# Patient Record
Sex: Male | Born: 1955
Health system: Southern US, Community
[De-identification: ages and names within clinical notes are randomized; demographics above are authoritative.]

## PROBLEM LIST (undated history)

## (undated) DIAGNOSIS — C61 Malignant neoplasm of prostate: Secondary | ICD-10-CM

## (undated) DIAGNOSIS — E7800 Pure hypercholesterolemia, unspecified: Secondary | ICD-10-CM

## (undated) DIAGNOSIS — I82409 Acute embolism and thrombosis of unspecified deep veins of unspecified lower extremity: Secondary | ICD-10-CM

## (undated) DIAGNOSIS — B1920 Unspecified viral hepatitis C without hepatic coma: Secondary | ICD-10-CM

## (undated) DIAGNOSIS — G4730 Sleep apnea, unspecified: Secondary | ICD-10-CM

## (undated) DIAGNOSIS — M1990 Unspecified osteoarthritis, unspecified site: Secondary | ICD-10-CM

## (undated) DIAGNOSIS — F329 Major depressive disorder, single episode, unspecified: Secondary | ICD-10-CM

## (undated) DIAGNOSIS — E119 Type 2 diabetes mellitus without complications: Secondary | ICD-10-CM

## (undated) DIAGNOSIS — I10 Essential (primary) hypertension: Secondary | ICD-10-CM

## (undated) DIAGNOSIS — I2699 Other pulmonary embolism without acute cor pulmonale: Secondary | ICD-10-CM

## (undated) DIAGNOSIS — F32A Depression, unspecified: Secondary | ICD-10-CM

## (undated) HISTORY — DX: Unspecified osteoarthritis, unspecified site: M19.90

## (undated) HISTORY — DX: Acute embolism and thrombosis of unspecified deep veins of unspecified lower extremity: I82.409

## (undated) HISTORY — DX: Pure hypercholesterolemia, unspecified: E78.00

## (undated) HISTORY — PX: REPAIR QUADRICEPS / HAMSTRING MUSCLE: SUR1204

---

## 1999-04-28 ENCOUNTER — Emergency Department (HOSPITAL_COMMUNITY): Admission: EM | Admit: 1999-04-28 | Discharge: 1999-04-28 | Payer: Self-pay

## 1999-06-10 ENCOUNTER — Encounter: Admission: RE | Admit: 1999-06-10 | Discharge: 1999-09-08 | Payer: Self-pay | Admitting: Occupational Medicine

## 1999-06-12 ENCOUNTER — Encounter: Payer: Self-pay | Admitting: Occupational Medicine

## 1999-06-12 ENCOUNTER — Ambulatory Visit (HOSPITAL_COMMUNITY): Admission: RE | Admit: 1999-06-12 | Discharge: 1999-06-12 | Payer: Self-pay | Admitting: Occupational Medicine

## 1999-07-19 ENCOUNTER — Emergency Department (HOSPITAL_COMMUNITY): Admission: EM | Admit: 1999-07-19 | Discharge: 1999-07-19 | Payer: Self-pay | Admitting: Emergency Medicine

## 1999-08-04 ENCOUNTER — Encounter: Admission: RE | Admit: 1999-08-04 | Discharge: 1999-11-02 | Payer: Self-pay | Admitting: Orthopedic Surgery

## 2001-02-28 ENCOUNTER — Emergency Department (HOSPITAL_COMMUNITY): Admission: EM | Admit: 2001-02-28 | Discharge: 2001-02-28 | Payer: Self-pay | Admitting: Emergency Medicine

## 2001-02-28 ENCOUNTER — Encounter: Payer: Self-pay | Admitting: Emergency Medicine

## 2001-04-17 ENCOUNTER — Encounter: Payer: Self-pay | Admitting: *Deleted

## 2001-04-17 ENCOUNTER — Emergency Department (HOSPITAL_COMMUNITY): Admission: EM | Admit: 2001-04-17 | Discharge: 2001-04-17 | Payer: Self-pay

## 2001-05-03 ENCOUNTER — Emergency Department (HOSPITAL_COMMUNITY): Admission: EM | Admit: 2001-05-03 | Discharge: 2001-05-03 | Payer: Self-pay | Admitting: Emergency Medicine

## 2001-05-18 ENCOUNTER — Encounter: Payer: Self-pay | Admitting: Emergency Medicine

## 2001-05-18 ENCOUNTER — Emergency Department (HOSPITAL_COMMUNITY): Admission: EM | Admit: 2001-05-18 | Discharge: 2001-05-18 | Payer: Self-pay | Admitting: Emergency Medicine

## 2001-06-12 ENCOUNTER — Emergency Department (HOSPITAL_COMMUNITY): Admission: EM | Admit: 2001-06-12 | Discharge: 2001-06-12 | Payer: Self-pay | Admitting: Emergency Medicine

## 2001-06-12 ENCOUNTER — Encounter: Payer: Self-pay | Admitting: Emergency Medicine

## 2001-07-03 ENCOUNTER — Inpatient Hospital Stay (HOSPITAL_COMMUNITY): Admission: EM | Admit: 2001-07-03 | Discharge: 2001-07-07 | Payer: Self-pay | Admitting: Psychiatry

## 2001-08-22 ENCOUNTER — Inpatient Hospital Stay (HOSPITAL_COMMUNITY): Admission: RE | Admit: 2001-08-22 | Discharge: 2001-08-25 | Payer: Self-pay | Admitting: Orthopedic Surgery

## 2001-11-10 ENCOUNTER — Encounter: Admission: RE | Admit: 2001-11-10 | Discharge: 2001-11-10 | Payer: Self-pay | Admitting: *Deleted

## 2001-12-11 ENCOUNTER — Encounter: Admission: RE | Admit: 2001-12-11 | Discharge: 2001-12-11 | Payer: Self-pay | Admitting: *Deleted

## 2001-12-12 ENCOUNTER — Encounter: Admission: RE | Admit: 2001-12-12 | Discharge: 2002-03-12 | Payer: Self-pay | Admitting: Orthopedic Surgery

## 2002-01-10 ENCOUNTER — Encounter: Admission: RE | Admit: 2002-01-10 | Discharge: 2002-01-10 | Payer: Self-pay | Admitting: *Deleted

## 2002-03-13 ENCOUNTER — Encounter: Admission: RE | Admit: 2002-03-13 | Discharge: 2002-04-03 | Payer: Self-pay | Admitting: Orthopedic Surgery

## 2002-04-10 ENCOUNTER — Encounter: Admission: RE | Admit: 2002-04-10 | Discharge: 2002-04-10 | Payer: Self-pay | Admitting: *Deleted

## 2002-05-08 ENCOUNTER — Encounter: Admission: RE | Admit: 2002-05-08 | Discharge: 2002-05-08 | Payer: Self-pay | Admitting: *Deleted

## 2002-09-19 ENCOUNTER — Emergency Department (HOSPITAL_COMMUNITY): Admission: EM | Admit: 2002-09-19 | Discharge: 2002-09-19 | Payer: Self-pay | Admitting: Emergency Medicine

## 2002-10-03 ENCOUNTER — Inpatient Hospital Stay (HOSPITAL_COMMUNITY): Admission: EM | Admit: 2002-10-03 | Discharge: 2002-10-12 | Payer: Self-pay | Admitting: *Deleted

## 2002-11-05 ENCOUNTER — Encounter: Admission: RE | Admit: 2002-11-05 | Discharge: 2002-11-05 | Payer: Self-pay | Admitting: *Deleted

## 2002-12-31 ENCOUNTER — Encounter: Admission: RE | Admit: 2002-12-31 | Discharge: 2002-12-31 | Payer: Self-pay | Admitting: *Deleted

## 2003-07-21 ENCOUNTER — Emergency Department (HOSPITAL_COMMUNITY): Admission: AD | Admit: 2003-07-21 | Discharge: 2003-07-21 | Payer: Self-pay | Admitting: Family Medicine

## 2003-08-20 ENCOUNTER — Inpatient Hospital Stay (HOSPITAL_COMMUNITY): Admission: EM | Admit: 2003-08-20 | Discharge: 2003-08-30 | Payer: Self-pay | Admitting: Psychiatry

## 2003-09-02 ENCOUNTER — Other Ambulatory Visit (HOSPITAL_COMMUNITY): Admission: RE | Admit: 2003-09-02 | Discharge: 2003-09-12 | Payer: Self-pay | Admitting: Psychiatry

## 2004-06-05 ENCOUNTER — Ambulatory Visit (HOSPITAL_COMMUNITY): Payer: Self-pay | Admitting: Psychiatry

## 2004-07-08 ENCOUNTER — Ambulatory Visit (HOSPITAL_COMMUNITY): Payer: Self-pay | Admitting: Psychiatry

## 2005-01-19 ENCOUNTER — Emergency Department (HOSPITAL_COMMUNITY): Admission: EM | Admit: 2005-01-19 | Discharge: 2005-01-19 | Payer: Self-pay | Admitting: Emergency Medicine

## 2005-02-05 ENCOUNTER — Emergency Department (HOSPITAL_COMMUNITY): Admission: EM | Admit: 2005-02-05 | Discharge: 2005-02-05 | Payer: Self-pay | Admitting: Emergency Medicine

## 2005-04-14 ENCOUNTER — Inpatient Hospital Stay (HOSPITAL_COMMUNITY): Admission: RE | Admit: 2005-04-14 | Discharge: 2005-04-23 | Payer: Self-pay | Admitting: Psychiatry

## 2005-04-14 ENCOUNTER — Ambulatory Visit: Payer: Self-pay | Admitting: Psychiatry

## 2005-04-14 ENCOUNTER — Emergency Department (HOSPITAL_COMMUNITY): Admission: EM | Admit: 2005-04-14 | Discharge: 2005-04-14 | Payer: Self-pay | Admitting: Emergency Medicine

## 2005-04-26 ENCOUNTER — Ambulatory Visit: Payer: Self-pay | Admitting: Psychiatry

## 2005-04-26 ENCOUNTER — Other Ambulatory Visit (HOSPITAL_COMMUNITY): Admission: RE | Admit: 2005-04-26 | Discharge: 2005-07-25 | Payer: Self-pay | Admitting: Psychiatry

## 2005-10-03 ENCOUNTER — Inpatient Hospital Stay (HOSPITAL_COMMUNITY): Admission: RE | Admit: 2005-10-03 | Discharge: 2005-10-13 | Payer: Self-pay | Admitting: Psychiatry

## 2005-10-04 ENCOUNTER — Ambulatory Visit: Payer: Self-pay | Admitting: Psychiatry

## 2006-07-09 ENCOUNTER — Ambulatory Visit: Payer: Self-pay | Admitting: Psychiatry

## 2006-07-09 ENCOUNTER — Inpatient Hospital Stay (HOSPITAL_COMMUNITY): Admission: EM | Admit: 2006-07-09 | Discharge: 2006-07-16 | Payer: Self-pay | Admitting: Psychiatry

## 2006-08-16 HISTORY — PX: PROSTATECTOMY: SHX69

## 2006-12-26 ENCOUNTER — Emergency Department (HOSPITAL_COMMUNITY): Admission: EM | Admit: 2006-12-26 | Discharge: 2006-12-26 | Payer: Self-pay | Admitting: Emergency Medicine

## 2006-12-29 ENCOUNTER — Emergency Department (HOSPITAL_COMMUNITY): Admission: EM | Admit: 2006-12-29 | Discharge: 2006-12-29 | Payer: Self-pay | Admitting: Emergency Medicine

## 2007-12-11 ENCOUNTER — Encounter: Admission: RE | Admit: 2007-12-11 | Discharge: 2007-12-11 | Payer: Self-pay | Admitting: Internal Medicine

## 2009-01-02 ENCOUNTER — Other Ambulatory Visit: Payer: Self-pay | Admitting: Emergency Medicine

## 2009-01-02 ENCOUNTER — Ambulatory Visit: Payer: Self-pay | Admitting: Psychiatry

## 2009-01-03 ENCOUNTER — Inpatient Hospital Stay (HOSPITAL_COMMUNITY): Admission: AD | Admit: 2009-01-03 | Discharge: 2009-01-17 | Payer: Self-pay | Admitting: Psychiatry

## 2009-01-03 ENCOUNTER — Other Ambulatory Visit: Payer: Self-pay | Admitting: Emergency Medicine

## 2009-03-11 ENCOUNTER — Inpatient Hospital Stay (HOSPITAL_COMMUNITY): Admission: EM | Admit: 2009-03-11 | Discharge: 2009-03-13 | Payer: Self-pay | Admitting: Emergency Medicine

## 2009-03-13 ENCOUNTER — Encounter (INDEPENDENT_AMBULATORY_CARE_PROVIDER_SITE_OTHER): Payer: Self-pay | Admitting: Internal Medicine

## 2009-03-22 ENCOUNTER — Observation Stay (HOSPITAL_COMMUNITY): Admission: EM | Admit: 2009-03-22 | Discharge: 2009-03-23 | Payer: Self-pay | Admitting: Emergency Medicine

## 2009-03-22 ENCOUNTER — Encounter (INDEPENDENT_AMBULATORY_CARE_PROVIDER_SITE_OTHER): Payer: Self-pay | Admitting: General Surgery

## 2009-04-11 DIAGNOSIS — F329 Major depressive disorder, single episode, unspecified: Secondary | ICD-10-CM

## 2009-04-11 DIAGNOSIS — E119 Type 2 diabetes mellitus without complications: Secondary | ICD-10-CM | POA: Insufficient documentation

## 2009-04-11 DIAGNOSIS — K5730 Diverticulosis of large intestine without perforation or abscess without bleeding: Secondary | ICD-10-CM | POA: Insufficient documentation

## 2009-04-11 DIAGNOSIS — R109 Unspecified abdominal pain: Secondary | ICD-10-CM | POA: Insufficient documentation

## 2009-04-11 DIAGNOSIS — K819 Cholecystitis, unspecified: Secondary | ICD-10-CM

## 2009-04-11 DIAGNOSIS — I10 Essential (primary) hypertension: Secondary | ICD-10-CM | POA: Insufficient documentation

## 2009-04-11 DIAGNOSIS — F1910 Other psychoactive substance abuse, uncomplicated: Secondary | ICD-10-CM | POA: Insufficient documentation

## 2009-04-11 DIAGNOSIS — R079 Chest pain, unspecified: Secondary | ICD-10-CM | POA: Insufficient documentation

## 2009-04-11 DIAGNOSIS — F319 Bipolar disorder, unspecified: Secondary | ICD-10-CM | POA: Insufficient documentation

## 2009-04-11 DIAGNOSIS — E669 Obesity, unspecified: Secondary | ICD-10-CM

## 2009-06-02 ENCOUNTER — Ambulatory Visit: Payer: Self-pay | Admitting: Cardiovascular Disease

## 2009-06-02 DIAGNOSIS — I4510 Unspecified right bundle-branch block: Secondary | ICD-10-CM

## 2009-06-02 DIAGNOSIS — Q249 Congenital malformation of heart, unspecified: Secondary | ICD-10-CM | POA: Insufficient documentation

## 2009-06-23 ENCOUNTER — Emergency Department (HOSPITAL_COMMUNITY): Admission: EM | Admit: 2009-06-23 | Discharge: 2009-06-23 | Payer: Self-pay | Admitting: Emergency Medicine

## 2009-10-13 ENCOUNTER — Ambulatory Visit (HOSPITAL_COMMUNITY): Admission: RE | Admit: 2009-10-13 | Discharge: 2009-10-13 | Payer: Self-pay | Admitting: Urology

## 2009-11-13 ENCOUNTER — Inpatient Hospital Stay (HOSPITAL_COMMUNITY): Admission: RE | Admit: 2009-11-13 | Discharge: 2009-11-16 | Payer: Self-pay | Admitting: Urology

## 2009-11-13 ENCOUNTER — Encounter (INDEPENDENT_AMBULATORY_CARE_PROVIDER_SITE_OTHER): Payer: Self-pay | Admitting: Urology

## 2009-12-04 ENCOUNTER — Inpatient Hospital Stay (HOSPITAL_COMMUNITY): Admission: EM | Admit: 2009-12-04 | Discharge: 2009-12-19 | Payer: Self-pay | Admitting: Emergency Medicine

## 2009-12-19 ENCOUNTER — Inpatient Hospital Stay (HOSPITAL_COMMUNITY): Admission: AD | Admit: 2009-12-19 | Discharge: 2009-12-24 | Payer: Self-pay | Admitting: Psychiatry

## 2009-12-19 ENCOUNTER — Ambulatory Visit: Payer: Self-pay | Admitting: Psychiatry

## 2009-12-24 ENCOUNTER — Ambulatory Visit (HOSPITAL_COMMUNITY): Admission: RE | Admit: 2009-12-24 | Discharge: 2009-12-24 | Payer: Self-pay | Admitting: Psychiatry

## 2009-12-24 ENCOUNTER — Ambulatory Visit: Payer: Self-pay | Admitting: Vascular Surgery

## 2009-12-24 ENCOUNTER — Encounter (HOSPITAL_COMMUNITY): Payer: Self-pay | Admitting: Psychiatry

## 2009-12-24 ENCOUNTER — Inpatient Hospital Stay (HOSPITAL_COMMUNITY): Admission: EM | Admit: 2009-12-24 | Discharge: 2009-12-29 | Payer: Self-pay | Admitting: Psychiatry

## 2009-12-25 ENCOUNTER — Ambulatory Visit: Payer: Self-pay | Admitting: Psychiatry

## 2009-12-29 ENCOUNTER — Inpatient Hospital Stay (HOSPITAL_COMMUNITY): Admission: EM | Admit: 2009-12-29 | Discharge: 2010-01-03 | Payer: Self-pay | Admitting: Psychiatry

## 2010-07-02 ENCOUNTER — Emergency Department (HOSPITAL_COMMUNITY): Admission: EM | Admit: 2010-07-02 | Discharge: 2010-07-02 | Payer: Self-pay | Admitting: Emergency Medicine

## 2010-11-02 ENCOUNTER — Encounter (HOSPITAL_COMMUNITY): Payer: Medicare Other

## 2010-11-02 ENCOUNTER — Ambulatory Visit (HOSPITAL_COMMUNITY)
Admission: RE | Admit: 2010-11-02 | Discharge: 2010-11-02 | Disposition: A | Discharge status: Home / Self Care | Payer: Medicare Other | Source: Ambulatory Visit | Attending: General Surgery | Admitting: General Surgery

## 2010-11-02 ENCOUNTER — Other Ambulatory Visit (HOSPITAL_COMMUNITY): Payer: Self-pay | Admitting: General Surgery

## 2010-11-02 DIAGNOSIS — K439 Ventral hernia without obstruction or gangrene: Secondary | ICD-10-CM | POA: Insufficient documentation

## 2010-11-02 DIAGNOSIS — Z01818 Encounter for other preprocedural examination: Secondary | ICD-10-CM | POA: Insufficient documentation

## 2010-11-02 LAB — CBC
HCT: 39.4 % (ref 39.0–52.0)
HCT: 39.2 % (ref 39.0–52.0)
Hemoglobin: 13.3 g/dL (ref 13.0–17.0)
Hemoglobin: 13.1 g/dL (ref 13.0–17.0)
Hemoglobin: 13.2 g/dL (ref 13.0–17.0)
MCHC: 33.7 g/dL (ref 30.0–36.0)
MCHC: 33.6 g/dL (ref 30.0–36.0)
MCHC: 33.6 g/dL (ref 30.0–36.0)
MCV: 95.5 fL (ref 78.0–100.0)
RBC: 4.18 MIL/uL — ABNORMAL LOW (ref 4.22–5.81)
RBC: 4.09 MIL/uL — ABNORMAL LOW (ref 4.22–5.81)
RBC: 4.16 MIL/uL — ABNORMAL LOW (ref 4.22–5.81)
RDW: 12.8 % (ref 11.5–15.5)
RDW: 14.1 % (ref 11.5–15.5)
WBC: 4.2 10*3/uL (ref 4.0–10.5)

## 2010-11-02 LAB — CLOSTRIDIUM DIFFICILE EIA

## 2010-11-02 LAB — GLUCOSE, CAPILLARY
Glucose-Capillary: 103 mg/dL — ABNORMAL HIGH (ref 70–99)
Glucose-Capillary: 105 mg/dL — ABNORMAL HIGH (ref 70–99)
Glucose-Capillary: 110 mg/dL — ABNORMAL HIGH (ref 70–99)
Glucose-Capillary: 112 mg/dL — ABNORMAL HIGH (ref 70–99)
Glucose-Capillary: 118 mg/dL — ABNORMAL HIGH (ref 70–99)
Glucose-Capillary: 120 mg/dL — ABNORMAL HIGH (ref 70–99)
Glucose-Capillary: 122 mg/dL — ABNORMAL HIGH (ref 70–99)
Glucose-Capillary: 132 mg/dL — ABNORMAL HIGH (ref 70–99)
Glucose-Capillary: 141 mg/dL — ABNORMAL HIGH (ref 70–99)
Glucose-Capillary: 94 mg/dL (ref 70–99)
Glucose-Capillary: 115 mg/dL — ABNORMAL HIGH (ref 70–99)
Glucose-Capillary: 121 mg/dL — ABNORMAL HIGH (ref 70–99)
Glucose-Capillary: 122 mg/dL — ABNORMAL HIGH (ref 70–99)
Glucose-Capillary: 91 mg/dL (ref 70–99)

## 2010-11-02 LAB — COMPREHENSIVE METABOLIC PANEL
ALT: 90 U/L — ABNORMAL HIGH (ref 0–53)
AST: 43 U/L — ABNORMAL HIGH (ref 0–37)
Albumin: 3.6 g/dL (ref 3.5–5.2)
Alkaline Phosphatase: 60 U/L (ref 39–117)
BUN: 17 mg/dL (ref 6–23)
BUN: 17 mg/dL (ref 6–23)
CO2: 24 mEq/L (ref 19–32)
Calcium: 9.4 mg/dL (ref 8.4–10.5)
Chloride: 107 mEq/L (ref 96–112)
Creatinine, Ser: 0.89 mg/dL (ref 0.4–1.5)
GFR calc Af Amer: >60 mL/min (ref >60)
GFR calc non Af Amer: >60 mL/min (ref >60)
Glucose, Bld: 109 mg/dL — ABNORMAL HIGH (ref 70–99)
Potassium: 4.2 mEq/L (ref 3.5–5.1)
Sodium: 135 mEq/L (ref 135–145)
Total Bilirubin: 0.6 mg/dL (ref 0.3–1.2)
Total Protein: 7.9 g/dL (ref 6.0–8.3)

## 2010-11-02 LAB — HEPATIC FUNCTION PANEL
ALT: 128 U/L — ABNORMAL HIGH (ref 0–53)
AST: 121 U/L — ABNORMAL HIGH (ref 0–37)
Albumin: 3 g/dL — ABNORMAL LOW (ref 3.5–5.2)
Bilirubin, Direct: 0.1 mg/dL (ref 0.0–0.3)
Total Protein: 9.3 g/dL — ABNORMAL HIGH (ref 6.0–8.3)

## 2010-11-02 LAB — PROTIME-INR
INR: 1.76 — ABNORMAL HIGH (ref 0.00–1.49)
INR: 2.29 — ABNORMAL HIGH (ref 0.00–1.49)
INR: 2.35 — ABNORMAL HIGH (ref 0.00–1.49)
INR: 2.52 — ABNORMAL HIGH (ref 0.00–1.49)
INR: 2.23 — ABNORMAL HIGH (ref 0.00–1.49)
INR: 2.28 — ABNORMAL HIGH (ref 0.00–1.49)
Prothrombin Time: 20.7 seconds — ABNORMAL HIGH (ref 11.6–15.2)
Prothrombin Time: 25.5 seconds — ABNORMAL HIGH (ref 11.6–15.2)
Prothrombin Time: 27 seconds — ABNORMAL HIGH (ref 11.6–15.2)
Prothrombin Time: 24.9 seconds — ABNORMAL HIGH (ref 11.6–15.2)

## 2010-11-02 LAB — BASIC METABOLIC PANEL
CO2: 26 mEq/L (ref 19–32)
Calcium: 9.7 mg/dL (ref 8.4–10.5)
Calcium: 9.8 mg/dL (ref 8.4–10.5)
Creatinine, Ser: 0.78 mg/dL (ref 0.4–1.5)
GFR calc Af Amer: >60 mL/min (ref >60)
GFR calc Af Amer: >60 mL/min (ref >60)
GFR calc non Af Amer: >60 mL/min (ref >60)
Potassium: 4.6 mEq/L (ref 3.5–5.1)
Sodium: 134 mEq/L — ABNORMAL LOW (ref 135–145)
Sodium: 136 mEq/L (ref 135–145)

## 2010-11-02 LAB — DIFFERENTIAL
Basophils Relative: 1 % (ref 0–1)
Eosinophils Absolute: 0.1 10*3/uL (ref 0.0–0.7)
Lymphocytes Relative: 61 % — ABNORMAL HIGH (ref 12–46)
Lymphs Abs: 2.9 10*3/uL (ref 0.7–4.0)
Neutro Abs: 1.3 10*3/uL — ABNORMAL LOW (ref 1.7–7.7)
Neutrophils Relative %: 28 % — ABNORMAL LOW (ref 43–77)

## 2010-11-02 LAB — SURGICAL PCR SCREEN: MRSA, PCR: NEGATIVE

## 2010-11-02 LAB — HEPARIN ANTI-XA: Heparin LMW: 0.59 IU/mL

## 2010-11-03 LAB — HEPARIN LEVEL (UNFRACTIONATED): Heparin Unfractionated: 0.51 IU/mL (ref 0.30–0.70)

## 2010-11-03 LAB — GLUCOSE, CAPILLARY
Glucose-Capillary: 101 mg/dL — ABNORMAL HIGH (ref 70–99)
Glucose-Capillary: 113 mg/dL — ABNORMAL HIGH (ref 70–99)
Glucose-Capillary: 114 mg/dL — ABNORMAL HIGH (ref 70–99)
Glucose-Capillary: 114 mg/dL — ABNORMAL HIGH (ref 70–99)
Glucose-Capillary: 118 mg/dL — ABNORMAL HIGH (ref 70–99)
Glucose-Capillary: 120 mg/dL — ABNORMAL HIGH (ref 70–99)
Glucose-Capillary: 131 mg/dL — ABNORMAL HIGH (ref 70–99)
Glucose-Capillary: 86 mg/dL (ref 70–99)
Glucose-Capillary: 103 mg/dL — ABNORMAL HIGH (ref 70–99)
Glucose-Capillary: 111 mg/dL — ABNORMAL HIGH (ref 70–99)
Glucose-Capillary: 111 mg/dL — ABNORMAL HIGH (ref 70–99)
Glucose-Capillary: 115 mg/dL — ABNORMAL HIGH (ref 70–99)
Glucose-Capillary: 117 mg/dL — ABNORMAL HIGH (ref 70–99)
Glucose-Capillary: 122 mg/dL — ABNORMAL HIGH (ref 70–99)
Glucose-Capillary: 128 mg/dL — ABNORMAL HIGH (ref 70–99)
Glucose-Capillary: 130 mg/dL — ABNORMAL HIGH (ref 70–99)
Glucose-Capillary: 134 mg/dL — ABNORMAL HIGH (ref 70–99)
Glucose-Capillary: 136 mg/dL — ABNORMAL HIGH (ref 70–99)
Glucose-Capillary: 146 mg/dL — ABNORMAL HIGH (ref 70–99)
Glucose-Capillary: 151 mg/dL — ABNORMAL HIGH (ref 70–99)
Glucose-Capillary: 90 mg/dL (ref 70–99)
Glucose-Capillary: 91 mg/dL (ref 70–99)
Glucose-Capillary: 94 mg/dL (ref 70–99)
Glucose-Capillary: 97 mg/dL (ref 70–99)
Glucose-Capillary: 99 mg/dL (ref 70–99)
Glucose-Capillary: 100 mg/dL — ABNORMAL HIGH (ref 70–99)
Glucose-Capillary: 101 mg/dL — ABNORMAL HIGH (ref 70–99)
Glucose-Capillary: 103 mg/dL — ABNORMAL HIGH (ref 70–99)
Glucose-Capillary: 104 mg/dL — ABNORMAL HIGH (ref 70–99)
Glucose-Capillary: 110 mg/dL — ABNORMAL HIGH (ref 70–99)
Glucose-Capillary: 113 mg/dL — ABNORMAL HIGH (ref 70–99)
Glucose-Capillary: 118 mg/dL — ABNORMAL HIGH (ref 70–99)
Glucose-Capillary: 120 mg/dL — ABNORMAL HIGH (ref 70–99)
Glucose-Capillary: 121 mg/dL — ABNORMAL HIGH (ref 70–99)
Glucose-Capillary: 121 mg/dL — ABNORMAL HIGH (ref 70–99)
Glucose-Capillary: 127 mg/dL — ABNORMAL HIGH (ref 70–99)
Glucose-Capillary: 131 mg/dL — ABNORMAL HIGH (ref 70–99)
Glucose-Capillary: 138 mg/dL — ABNORMAL HIGH (ref 70–99)
Glucose-Capillary: 140 mg/dL — ABNORMAL HIGH (ref 70–99)
Glucose-Capillary: 143 mg/dL — ABNORMAL HIGH (ref 70–99)
Glucose-Capillary: 148 mg/dL — ABNORMAL HIGH (ref 70–99)
Glucose-Capillary: 152 mg/dL — ABNORMAL HIGH (ref 70–99)
Glucose-Capillary: 157 mg/dL — ABNORMAL HIGH (ref 70–99)
Glucose-Capillary: 94 mg/dL (ref 70–99)
Glucose-Capillary: 97 mg/dL (ref 70–99)
Glucose-Capillary: 97 mg/dL (ref 70–99)
Glucose-Capillary: 99 mg/dL (ref 70–99)

## 2010-11-03 LAB — CBC
HCT: 36.5 % — ABNORMAL LOW (ref 39.0–52.0)
HCT: 38.3 % — ABNORMAL LOW (ref 39.0–52.0)
HCT: 40.3 % (ref 39.0–52.0)
HCT: 37.1 % — ABNORMAL LOW (ref 39.0–52.0)
HCT: 37.7 % — ABNORMAL LOW (ref 39.0–52.0)
HCT: 37.7 % — ABNORMAL LOW (ref 39.0–52.0)
HCT: 37.9 % — ABNORMAL LOW (ref 39.0–52.0)
HCT: 39.2 % (ref 39.0–52.0)
HCT: 39.8 % (ref 39.0–52.0)
HCT: 40.8 % (ref 39.0–52.0)
Hemoglobin: 12.6 g/dL — ABNORMAL LOW (ref 13.0–17.0)
Hemoglobin: 13 g/dL (ref 13.0–17.0)
Hemoglobin: 12.4 g/dL — ABNORMAL LOW (ref 13.0–17.0)
Hemoglobin: 12.6 g/dL — ABNORMAL LOW (ref 13.0–17.0)
Hemoglobin: 12.7 g/dL — ABNORMAL LOW (ref 13.0–17.0)
Hemoglobin: 12.7 g/dL — ABNORMAL LOW (ref 13.0–17.0)
Hemoglobin: 12.8 g/dL — ABNORMAL LOW (ref 13.0–17.0)
MCHC: 33.3 g/dL (ref 30.0–36.0)
MCHC: 33.3 g/dL (ref 30.0–36.0)
MCHC: 34.1 g/dL (ref 30.0–36.0)
MCHC: 33.5 g/dL (ref 30.0–36.0)
MCHC: 33.9 g/dL (ref 30.0–36.0)
MCHC: 33.9 g/dL (ref 30.0–36.0)
MCHC: 33.9 g/dL (ref 30.0–36.0)
MCV: 94.3 fL (ref 78.0–100.0)
MCV: 95.2 fL (ref 78.0–100.0)
MCV: 94.5 fL (ref 78.0–100.0)
MCV: 95.1 fL (ref 78.0–100.0)
MCV: 95.3 fL (ref 78.0–100.0)
MCV: 95.5 fL (ref 78.0–100.0)
MCV: 95.5 fL (ref 78.0–100.0)
Platelets: 265 10*3/uL (ref 150–400)
Platelets: 287 10*3/uL (ref 150–400)
Platelets: 290 10*3/uL (ref 150–400)
Platelets: 293 10*3/uL (ref 150–400)
Platelets: 294 10*3/uL (ref 150–400)
Platelets: 234 10*3/uL (ref 150–400)
Platelets: 251 10*3/uL (ref 150–400)
Platelets: 255 10*3/uL (ref 150–400)
Platelets: 264 10*3/uL (ref 150–400)
Platelets: 271 10*3/uL (ref 150–400)
Platelets: 296 10*3/uL (ref 150–400)
Platelets: 307 10*3/uL (ref 150–400)
RBC: 3.93 MIL/uL — ABNORMAL LOW (ref 4.22–5.81)
RBC: 4.28 MIL/uL (ref 4.22–5.81)
RBC: 3.91 MIL/uL — ABNORMAL LOW (ref 4.22–5.81)
RBC: 3.96 MIL/uL — ABNORMAL LOW (ref 4.22–5.81)
RBC: 3.97 MIL/uL — ABNORMAL LOW (ref 4.22–5.81)
RBC: 4.11 MIL/uL — ABNORMAL LOW (ref 4.22–5.81)
RBC: 4.17 MIL/uL — ABNORMAL LOW (ref 4.22–5.81)
RDW: 13.1 % (ref 11.5–15.5)
RDW: 13.4 % (ref 11.5–15.5)
RDW: 13.9 % (ref 11.5–15.5)
RDW: 13.2 % (ref 11.5–15.5)
RDW: 13.3 % (ref 11.5–15.5)
RDW: 13.4 % (ref 11.5–15.5)
RDW: 13.4 % (ref 11.5–15.5)
RDW: 13.4 % (ref 11.5–15.5)
RDW: 13.5 % (ref 11.5–15.5)
RDW: 13.6 % (ref 11.5–15.5)
WBC: 11.6 10*3/uL — ABNORMAL HIGH (ref 4.0–10.5)
WBC: 4.5 10*3/uL (ref 4.0–10.5)
WBC: 7 10*3/uL (ref 4.0–10.5)
WBC: 13.7 10*3/uL — ABNORMAL HIGH (ref 4.0–10.5)
WBC: 14.2 10*3/uL — ABNORMAL HIGH (ref 4.0–10.5)
WBC: 14.3 10*3/uL — ABNORMAL HIGH (ref 4.0–10.5)
WBC: 17 10*3/uL — ABNORMAL HIGH (ref 4.0–10.5)
WBC: 8.1 10*3/uL (ref 4.0–10.5)
WBC: 9.1 10*3/uL (ref 4.0–10.5)
WBC: 9.7 10*3/uL (ref 4.0–10.5)

## 2010-11-03 LAB — DIFFERENTIAL
Basophils Absolute: 0 10*3/uL (ref 0.0–0.1)
Basophils Absolute: 0 10*3/uL (ref 0.0–0.1)
Basophils Absolute: 0 10*3/uL (ref 0.0–0.1)
Basophils Absolute: 0 10*3/uL (ref 0.0–0.1)
Basophils Relative: 0 % (ref 0–1)
Basophils Relative: 0 % (ref 0–1)
Eosinophils Absolute: 0.1 10*3/uL (ref 0.0–0.7)
Eosinophils Absolute: 0.3 10*3/uL (ref 0.0–0.7)
Eosinophils Relative: 1 % (ref 0–5)
Eosinophils Relative: 3 % (ref 0–5)
Lymphocytes Relative: 36 % (ref 12–46)
Lymphocytes Relative: 16 % (ref 12–46)
Lymphocytes Relative: 16 % (ref 12–46)
Lymphs Abs: 2.5 10*3/uL (ref 0.7–4.0)
Monocytes Absolute: 0.8 10*3/uL (ref 0.1–1.0)
Neutro Abs: 3.4 10*3/uL (ref 1.7–7.7)
Neutro Abs: 11.5 10*3/uL — ABNORMAL HIGH (ref 1.7–7.7)
Neutrophils Relative %: 78 % — ABNORMAL HIGH (ref 43–77)

## 2010-11-03 LAB — COMPREHENSIVE METABOLIC PANEL
AST: 63 U/L — ABNORMAL HIGH (ref 0–37)
Albumin: 2.9 g/dL — ABNORMAL LOW (ref 3.5–5.2)
Albumin: 2.4 g/dL — ABNORMAL LOW (ref 3.5–5.2)
Alkaline Phosphatase: 46 U/L (ref 39–117)
BUN: 20 mg/dL (ref 6–23)
BUN: 14 mg/dL (ref 6–23)
CO2: 23 mEq/L (ref 19–32)
CO2: 22 mEq/L (ref 19–32)
Calcium: 9.4 mg/dL (ref 8.4–10.5)
Calcium: 8.5 mg/dL (ref 8.4–10.5)
Chloride: 103 mEq/L (ref 96–112)
Chloride: 103 mEq/L (ref 96–112)
Creatinine, Ser: 0.95 mg/dL (ref 0.4–1.5)
Creatinine, Ser: 0.91 mg/dL (ref 0.4–1.5)
Creatinine, Ser: 0.98 mg/dL (ref 0.4–1.5)
GFR calc Af Amer: >60 mL/min (ref >60)
GFR calc non Af Amer: >60 mL/min (ref >60)
GFR calc non Af Amer: >60 mL/min (ref >60)
Glucose, Bld: 157 mg/dL — ABNORMAL HIGH (ref 70–99)
Potassium: 4 mEq/L (ref 3.5–5.1)
Potassium: 4.7 mEq/L (ref 3.5–5.1)
Total Bilirubin: 0.4 mg/dL (ref 0.3–1.2)
Total Bilirubin: 0.6 mg/dL (ref 0.3–1.2)
Total Protein: 7.2 g/dL (ref 6.0–8.3)

## 2010-11-03 LAB — BASIC METABOLIC PANEL
BUN: 12 mg/dL (ref 6–23)
BUN: 11 mg/dL (ref 6–23)
BUN: 8 mg/dL (ref 6–23)
BUN: 9 mg/dL (ref 6–23)
Calcium: 8.5 mg/dL (ref 8.4–10.5)
Calcium: 8.6 mg/dL (ref 8.4–10.5)
Chloride: 100 mEq/L (ref 96–112)
Chloride: 99 mEq/L (ref 96–112)
Creatinine, Ser: 0.74 mg/dL (ref 0.4–1.5)
Creatinine, Ser: 0.91 mg/dL (ref 0.4–1.5)
GFR calc non Af Amer: >60 mL/min (ref >60)
GFR calc non Af Amer: >60 mL/min (ref >60)
GFR calc non Af Amer: >60 mL/min (ref >60)
Glucose, Bld: 101 mg/dL — ABNORMAL HIGH (ref 70–99)
Glucose, Bld: 105 mg/dL — ABNORMAL HIGH (ref 70–99)
Glucose, Bld: 93 mg/dL (ref 70–99)
Glucose, Bld: 94 mg/dL (ref 70–99)
Potassium: 5 mEq/L (ref 3.5–5.1)
Potassium: 4.3 mEq/L (ref 3.5–5.1)
Potassium: 4.5 mEq/L (ref 3.5–5.1)
Sodium: 134 mEq/L — ABNORMAL LOW (ref 135–145)

## 2010-11-03 LAB — URINALYSIS, ROUTINE W REFLEX MICROSCOPIC
Bilirubin Urine: NEGATIVE
Bilirubin Urine: NEGATIVE
Glucose, UA: NEGATIVE mg/dL
Glucose, UA: NEGATIVE mg/dL
Hgb urine dipstick: NEGATIVE
Ketones, ur: NEGATIVE mg/dL
Nitrite: NEGATIVE
Protein, ur: NEGATIVE mg/dL
Specific Gravity, Urine: 1.03 (ref 1.005–1.030)
Specific Gravity, Urine: 1.022 (ref 1.005–1.030)
Urobilinogen, UA: 1 mg/dL (ref 0.0–1.0)
pH: 5.5 (ref 5.0–8.0)
pH: 5.5 (ref 5.0–8.0)

## 2010-11-03 LAB — URINE CULTURE
Colony Count: NO GROWTH
Colony Count: NO GROWTH
Culture: NO GROWTH
Special Requests: NEGATIVE

## 2010-11-03 LAB — URINE MICROSCOPIC-ADD ON

## 2010-11-03 LAB — CLOSTRIDIUM DIFFICILE EIA
C difficile Toxins A+B, EIA: 5
C difficile Toxins A+B, EIA: NEGATIVE

## 2010-11-03 LAB — CULTURE, ROUTINE-ABSCESS

## 2010-11-03 LAB — ANAEROBIC CULTURE

## 2010-11-03 LAB — HEPATIC FUNCTION PANEL
Albumin: 2.7 g/dL — ABNORMAL LOW (ref 3.5–5.2)
Total Bilirubin: 0.4 mg/dL (ref 0.3–1.2)
Total Protein: 8.3 g/dL (ref 6.0–8.3)

## 2010-11-03 LAB — PROTIME-INR
INR: 1.12 (ref 0.00–1.49)
Prothrombin Time: 14.3 seconds (ref 11.6–15.2)
Prothrombin Time: 20.7 seconds — ABNORMAL HIGH (ref 11.6–15.2)

## 2010-11-03 LAB — MAGNESIUM: Magnesium: 2.1 mg/dL (ref 1.5–2.5)

## 2010-11-03 LAB — HEMOGLOBIN A1C
Hgb A1c MFr Bld: 7.1 % — ABNORMAL HIGH (ref <5.7)
Mean Plasma Glucose: 157 mg/dL — ABNORMAL HIGH (ref <117)

## 2010-11-03 LAB — MRSA PCR SCREENING: MRSA by PCR: NEGATIVE

## 2010-11-03 LAB — HEPARIN ANTI-XA: Heparin LMW: 0.53 IU/mL

## 2010-11-03 LAB — RAPID URINE DRUG SCREEN, HOSP PERFORMED: Benzodiazepines: NOT DETECTED

## 2010-11-03 LAB — PHOSPHORUS: Phosphorus: 4.2 mg/dL (ref 2.3–4.6)

## 2010-11-04 LAB — GLUCOSE, CAPILLARY
Glucose-Capillary: 124 mg/dL — ABNORMAL HIGH (ref 70–99)
Glucose-Capillary: 128 mg/dL — ABNORMAL HIGH (ref 70–99)
Glucose-Capillary: 137 mg/dL — ABNORMAL HIGH (ref 70–99)
Glucose-Capillary: 143 mg/dL — ABNORMAL HIGH (ref 70–99)
Glucose-Capillary: 180 mg/dL — ABNORMAL HIGH (ref 70–99)
Glucose-Capillary: 182 mg/dL — ABNORMAL HIGH (ref 70–99)
Glucose-Capillary: 183 mg/dL — ABNORMAL HIGH (ref 70–99)
Glucose-Capillary: 316 mg/dL — ABNORMAL HIGH (ref 70–99)
Glucose-Capillary: 96 mg/dL (ref 70–99)

## 2010-11-04 LAB — HEMOGLOBIN AND HEMATOCRIT, BLOOD
HCT: 39 % (ref 39.0–52.0)
Hemoglobin: 13 g/dL (ref 13.0–17.0)

## 2010-11-05 ENCOUNTER — Ambulatory Visit (INDEPENDENT_AMBULATORY_CARE_PROVIDER_SITE_OTHER): Payer: Medicare Other | Admitting: Gastroenterology

## 2010-11-05 DIAGNOSIS — B182 Chronic viral hepatitis C: Secondary | ICD-10-CM

## 2010-11-09 LAB — TYPE AND SCREEN
ABO/RH(D): A POS
Antibody Screen: NEGATIVE

## 2010-11-09 LAB — BASIC METABOLIC PANEL
BUN: 16 mg/dL (ref 6–23)
CO2: 24 mEq/L (ref 19–32)
Chloride: 106 mEq/L (ref 96–112)
GFR calc non Af Amer: >60 mL/min (ref >60)
Glucose, Bld: 163 mg/dL — ABNORMAL HIGH (ref 70–99)
Potassium: 4 mEq/L (ref 3.5–5.1)
Sodium: 138 mEq/L (ref 135–145)

## 2010-11-09 LAB — CBC
HCT: 46.7 % (ref 39.0–52.0)
Hemoglobin: 16.2 g/dL (ref 13.0–17.0)
MCHC: 34.8 g/dL (ref 30.0–36.0)
MCV: 95.2 fL (ref 78.0–100.0)
Platelets: 125 10*3/uL — ABNORMAL LOW (ref 150–400)
RDW: 13.4 % (ref 11.5–15.5)

## 2010-11-09 LAB — GLUCOSE, CAPILLARY
Glucose-Capillary: 177 mg/dL — ABNORMAL HIGH (ref 70–99)
Glucose-Capillary: 186 mg/dL — ABNORMAL HIGH (ref 70–99)

## 2010-11-09 LAB — ABO/RH: ABO/RH(D): A POS

## 2010-11-09 LAB — RAPID URINE DRUG SCREEN, HOSP PERFORMED
Cocaine: POSITIVE — AB
Tetrahydrocannabinol: NOT DETECTED

## 2010-11-09 LAB — HEMOGLOBIN AND HEMATOCRIT, BLOOD: Hemoglobin: 13.6 g/dL (ref 13.0–17.0)

## 2010-11-12 ENCOUNTER — Inpatient Hospital Stay (HOSPITAL_COMMUNITY)
Admission: RE | Admit: 2010-11-12 | Discharge: 2010-11-19 | DRG: 354 | Disposition: A | Discharge status: Home / Self Care | Payer: Medicare Other | Source: Ambulatory Visit | Attending: General Surgery | Admitting: General Surgery

## 2010-11-12 DIAGNOSIS — E669 Obesity, unspecified: Secondary | ICD-10-CM | POA: Diagnosis present

## 2010-11-12 DIAGNOSIS — I10 Essential (primary) hypertension: Secondary | ICD-10-CM | POA: Diagnosis present

## 2010-11-12 DIAGNOSIS — F17200 Nicotine dependence, unspecified, uncomplicated: Secondary | ICD-10-CM | POA: Diagnosis present

## 2010-11-12 DIAGNOSIS — Z9079 Acquired absence of other genital organ(s): Secondary | ICD-10-CM

## 2010-11-12 DIAGNOSIS — E119 Type 2 diabetes mellitus without complications: Secondary | ICD-10-CM | POA: Diagnosis present

## 2010-11-12 DIAGNOSIS — IMO0002 Reserved for concepts with insufficient information to code with codable children: Secondary | ICD-10-CM | POA: Diagnosis not present

## 2010-11-12 DIAGNOSIS — K432 Incisional hernia without obstruction or gangrene: Principal | ICD-10-CM | POA: Diagnosis present

## 2010-11-12 DIAGNOSIS — Z01812 Encounter for preprocedural laboratory examination: Secondary | ICD-10-CM

## 2010-11-12 LAB — GLUCOSE, CAPILLARY
Glucose-Capillary: 130 mg/dL — ABNORMAL HIGH (ref 70–99)
Glucose-Capillary: 137 mg/dL — ABNORMAL HIGH (ref 70–99)
Glucose-Capillary: 97 mg/dL (ref 70–99)

## 2010-11-12 LAB — PROTIME-INR: Prothrombin Time: 13.4 seconds (ref 11.6–15.2)

## 2010-11-13 LAB — GLUCOSE, CAPILLARY
Glucose-Capillary: 124 mg/dL — ABNORMAL HIGH (ref 70–99)
Glucose-Capillary: 118 mg/dL — ABNORMAL HIGH (ref 70–99)
Glucose-Capillary: 104 mg/dL — ABNORMAL HIGH (ref 70–99)

## 2010-11-14 LAB — GLUCOSE, CAPILLARY: Glucose-Capillary: 112 mg/dL — ABNORMAL HIGH (ref 70–99)

## 2010-11-15 LAB — BASIC METABOLIC PANEL
Chloride: 102 mEq/L (ref 96–112)
GFR calc non Af Amer: >60 mL/min (ref >60)
Potassium: 4.5 mEq/L (ref 3.5–5.1)
Sodium: 132 mEq/L — ABNORMAL LOW (ref 135–145)

## 2010-11-15 LAB — CBC
Platelets: 163 10*3/uL (ref 150–400)
RBC: 4.08 MIL/uL — ABNORMAL LOW (ref 4.22–5.81)
RDW: 12.4 % (ref 11.5–15.5)
WBC: 5.9 10*3/uL (ref 4.0–10.5)

## 2010-11-15 LAB — GLUCOSE, CAPILLARY
Glucose-Capillary: 104 mg/dL — ABNORMAL HIGH (ref 70–99)
Glucose-Capillary: 100 mg/dL — ABNORMAL HIGH (ref 70–99)
Glucose-Capillary: 89 mg/dL (ref 70–99)

## 2010-11-16 LAB — GLUCOSE, CAPILLARY
Glucose-Capillary: 118 mg/dL — ABNORMAL HIGH (ref 70–99)
Glucose-Capillary: 97 mg/dL (ref 70–99)
Glucose-Capillary: 111 mg/dL — ABNORMAL HIGH (ref 70–99)

## 2010-11-17 LAB — GLUCOSE, CAPILLARY
Glucose-Capillary: 122 mg/dL — ABNORMAL HIGH (ref 70–99)
Glucose-Capillary: 137 mg/dL — ABNORMAL HIGH (ref 70–99)

## 2010-11-18 LAB — COMPREHENSIVE METABOLIC PANEL
ALT: 40 U/L (ref 0–53)
AST: 39 U/L — ABNORMAL HIGH (ref 0–37)
Albumin: 3.7 g/dL (ref 3.5–5.2)
CO2: 24 mEq/L (ref 19–32)
Chloride: 99 mEq/L (ref 96–112)
GFR calc Af Amer: >60 mL/min (ref >60)
GFR calc non Af Amer: >60 mL/min (ref >60)
Sodium: 136 mEq/L (ref 135–145)
Total Bilirubin: 0.9 mg/dL (ref 0.3–1.2)

## 2010-11-18 LAB — DIFFERENTIAL
Basophils Absolute: 0 10*3/uL (ref 0.0–0.1)
Eosinophils Absolute: 0.1 10*3/uL (ref 0.0–0.7)
Eosinophils Relative: 2 % (ref 0–5)
Lymphocytes Relative: 42 % (ref 12–46)
Lymphs Abs: 2.1 10*3/uL (ref 0.7–4.0)
Monocytes Absolute: 0.5 10*3/uL (ref 0.1–1.0)

## 2010-11-18 LAB — GLUCOSE, CAPILLARY: Glucose-Capillary: 112 mg/dL — ABNORMAL HIGH (ref 70–99)

## 2010-11-18 LAB — URINALYSIS, ROUTINE W REFLEX MICROSCOPIC
Nitrite: NEGATIVE
Protein, ur: NEGATIVE mg/dL
Specific Gravity, Urine: 1.025 (ref 1.005–1.030)
Urobilinogen, UA: 1 mg/dL (ref 0.0–1.0)

## 2010-11-18 LAB — CBC
Platelets: 156 10*3/uL (ref 150–400)
RBC: 5.09 MIL/uL (ref 4.22–5.81)
WBC: 5 10*3/uL (ref 4.0–10.5)

## 2010-11-18 LAB — PROTIME-INR: Prothrombin Time: 15.4 seconds — ABNORMAL HIGH (ref 11.6–15.2)

## 2010-11-18 LAB — D-DIMER, QUANTITATIVE: D-Dimer, Quant: 0.77 ug/mL-FEU — ABNORMAL HIGH (ref 0.00–0.48)

## 2010-11-19 LAB — PROTIME-INR
INR: 1.45 (ref 0.00–1.49)
Prothrombin Time: 17.8 seconds — ABNORMAL HIGH (ref 11.6–15.2)

## 2010-11-21 LAB — GLUCOSE, CAPILLARY
Glucose-Capillary: 124 mg/dL — ABNORMAL HIGH (ref 70–99)
Glucose-Capillary: 136 mg/dL — ABNORMAL HIGH (ref 70–99)
Glucose-Capillary: 154 mg/dL — ABNORMAL HIGH (ref 70–99)

## 2010-11-21 LAB — COMPREHENSIVE METABOLIC PANEL
ALT: 42 U/L (ref 0–53)
AST: 36 U/L (ref 0–37)
Alkaline Phosphatase: 47 U/L (ref 39–117)
CO2: 27 mEq/L (ref 19–32)
Chloride: 101 mEq/L (ref 96–112)
GFR calc Af Amer: >60 mL/min (ref >60)
GFR calc non Af Amer: >60 mL/min (ref >60)
Glucose, Bld: 181 mg/dL — ABNORMAL HIGH (ref 70–99)
Potassium: 3.7 mEq/L (ref 3.5–5.1)
Sodium: 135 mEq/L (ref 135–145)
Total Bilirubin: 1 mg/dL (ref 0.3–1.2)

## 2010-11-21 LAB — CBC
Hemoglobin: 16.2 g/dL (ref 13.0–17.0)
MCHC: 34.1 g/dL (ref 30.0–36.0)
RBC: 5.02 MIL/uL (ref 4.22–5.81)
WBC: 9.8 10*3/uL (ref 4.0–10.5)

## 2010-11-21 LAB — URINALYSIS, ROUTINE W REFLEX MICROSCOPIC
Bilirubin Urine: NEGATIVE
Glucose, UA: NEGATIVE mg/dL
Nitrite: NEGATIVE
Specific Gravity, Urine: 1.026 (ref 1.005–1.030)
pH: 5 (ref 5.0–8.0)

## 2010-11-21 LAB — DIFFERENTIAL
Basophils Absolute: 0.1 10*3/uL (ref 0.0–0.1)
Basophils Relative: 1 % (ref 0–1)
Eosinophils Absolute: 0.1 10*3/uL (ref 0.0–0.7)
Eosinophils Relative: 1 % (ref 0–5)
Lymphs Abs: 2 10*3/uL (ref 0.7–4.0)
Neutrophils Relative %: 74 % (ref 43–77)

## 2010-11-21 LAB — LIPASE, BLOOD: Lipase: 17 U/L (ref 11–59)

## 2010-11-21 LAB — HEMOGLOBIN A1C: Hgb A1c MFr Bld: 6.2 % — ABNORMAL HIGH (ref 4.6–6.1)

## 2010-11-21 LAB — RAPID URINE DRUG SCREEN, HOSP PERFORMED
Benzodiazepines: NOT DETECTED
Cocaine: NOT DETECTED
Opiates: POSITIVE — AB
Tetrahydrocannabinol: NOT DETECTED

## 2010-11-21 LAB — POCT CARDIAC MARKERS: Troponin i, poc: <0.05 ng/mL (ref 0.00–0.09)

## 2010-11-22 LAB — COMPREHENSIVE METABOLIC PANEL
ALT: 55 U/L — ABNORMAL HIGH (ref 0–53)
ALT: 64 U/L — ABNORMAL HIGH (ref 0–53)
ALT: 75 U/L — ABNORMAL HIGH (ref 0–53)
AST: 35 U/L (ref 0–37)
AST: 41 U/L — ABNORMAL HIGH (ref 0–37)
Albumin: 3.7 g/dL (ref 3.5–5.2)
Alkaline Phosphatase: 38 U/L — ABNORMAL LOW (ref 39–117)
Alkaline Phosphatase: 42 U/L (ref 39–117)
Alkaline Phosphatase: 47 U/L (ref 39–117)
CO2: 22 mEq/L (ref 19–32)
CO2: 26 mEq/L (ref 19–32)
Calcium: 9.3 mg/dL (ref 8.4–10.5)
Calcium: 9.6 mg/dL (ref 8.4–10.5)
GFR calc Af Amer: >60 mL/min (ref >60)
GFR calc Af Amer: >60 mL/min (ref >60)
GFR calc non Af Amer: >60 mL/min (ref >60)
Glucose, Bld: 208 mg/dL — ABNORMAL HIGH (ref 70–99)
Glucose, Bld: 99 mg/dL (ref 70–99)
Potassium: 4.2 mEq/L (ref 3.5–5.1)
Potassium: 4.3 mEq/L (ref 3.5–5.1)
Sodium: 136 mEq/L (ref 135–145)
Sodium: 137 mEq/L (ref 135–145)
Sodium: 138 mEq/L (ref 135–145)
Total Bilirubin: 0.8 mg/dL (ref 0.3–1.2)
Total Protein: 6.4 g/dL (ref 6.0–8.3)
Total Protein: 7.1 g/dL (ref 6.0–8.3)

## 2010-11-22 LAB — CARDIAC PANEL(CRET KIN+CKTOT+MB+TROPI)
CK, MB: 2.8 ng/mL (ref 0.3–4.0)
CK, MB: 3.5 ng/mL (ref 0.3–4.0)
Relative Index: 0.9 (ref 0.0–2.5)
Relative Index: 1 (ref 0.0–2.5)
Relative Index: 1 (ref 0.0–2.5)
Relative Index: 1.1 (ref 0.0–2.5)
Relative Index: 1.1 (ref 0.0–2.5)
Total CK: 263 U/L — ABNORMAL HIGH (ref 7–232)
Total CK: 376 U/L — ABNORMAL HIGH (ref 7–232)
Troponin I: 0.01 ng/mL (ref 0.00–0.06)
Troponin I: 0.02 ng/mL (ref 0.00–0.06)
Troponin I: 0.02 ng/mL (ref 0.00–0.06)

## 2010-11-22 LAB — CBC
Hemoglobin: 15.3 g/dL (ref 13.0–17.0)
Hemoglobin: 15.6 g/dL (ref 13.0–17.0)
MCHC: 33.9 g/dL (ref 30.0–36.0)
MCHC: 34.2 g/dL (ref 30.0–36.0)
RBC: 4.75 MIL/uL (ref 4.22–5.81)
RBC: 4.86 MIL/uL (ref 4.22–5.81)
RDW: 13.3 % (ref 11.5–15.5)

## 2010-11-22 LAB — DIFFERENTIAL
Basophils Absolute: 0 10*3/uL (ref 0.0–0.1)
Basophils Relative: 0 % (ref 0–1)
Eosinophils Absolute: 0.1 10*3/uL (ref 0.0–0.7)
Lymphs Abs: 2.6 10*3/uL (ref 0.7–4.0)
Neutrophils Relative %: 52 % (ref 43–77)

## 2010-11-22 LAB — LIPID PANEL
HDL: 45 mg/dL (ref >39)
VLDL: 12 mg/dL (ref 0–40)

## 2010-11-22 LAB — POCT CARDIAC MARKERS
Myoglobin, poc: 91.6 ng/mL (ref 12–200)
Troponin i, poc: <0.05 ng/mL (ref 0.00–0.09)

## 2010-11-22 LAB — TSH: TSH: 0.773 u[IU]/mL (ref 0.350–4.500)

## 2010-11-22 LAB — URINALYSIS, ROUTINE W REFLEX MICROSCOPIC
Glucose, UA: NEGATIVE mg/dL
Hgb urine dipstick: NEGATIVE
Specific Gravity, Urine: 1.026 (ref 1.005–1.030)
pH: 5 (ref 5.0–8.0)

## 2010-11-22 LAB — GLUCOSE, CAPILLARY
Glucose-Capillary: 102 mg/dL — ABNORMAL HIGH (ref 70–99)
Glucose-Capillary: 119 mg/dL — ABNORMAL HIGH (ref 70–99)
Glucose-Capillary: 210 mg/dL — ABNORMAL HIGH (ref 70–99)
Glucose-Capillary: 88 mg/dL (ref 70–99)

## 2010-11-22 LAB — RAPID URINE DRUG SCREEN, HOSP PERFORMED: Barbiturates: NOT DETECTED

## 2010-11-22 LAB — LIPASE, BLOOD: Lipase: 19 U/L (ref 11–59)

## 2010-11-23 LAB — GLUCOSE, CAPILLARY
Glucose-Capillary: 102 mg/dL — ABNORMAL HIGH (ref 70–99)
Glucose-Capillary: 102 mg/dL — ABNORMAL HIGH (ref 70–99)
Glucose-Capillary: 103 mg/dL — ABNORMAL HIGH (ref 70–99)
Glucose-Capillary: 105 mg/dL — ABNORMAL HIGH (ref 70–99)
Glucose-Capillary: 114 mg/dL — ABNORMAL HIGH (ref 70–99)
Glucose-Capillary: 123 mg/dL — ABNORMAL HIGH (ref 70–99)
Glucose-Capillary: 155 mg/dL — ABNORMAL HIGH (ref 70–99)

## 2010-11-24 LAB — COMPREHENSIVE METABOLIC PANEL
AST: 51 U/L — ABNORMAL HIGH (ref 0–37)
CO2: 24 mEq/L (ref 19–32)
Calcium: 9.5 mg/dL (ref 8.4–10.5)
Creatinine, Ser: 0.87 mg/dL (ref 0.4–1.5)
GFR calc Af Amer: >60 mL/min (ref >60)
GFR calc non Af Amer: >60 mL/min (ref >60)

## 2010-11-24 LAB — GLUCOSE, CAPILLARY
Glucose-Capillary: 107 mg/dL — ABNORMAL HIGH (ref 70–99)
Glucose-Capillary: 115 mg/dL — ABNORMAL HIGH (ref 70–99)
Glucose-Capillary: 116 mg/dL — ABNORMAL HIGH (ref 70–99)
Glucose-Capillary: 125 mg/dL — ABNORMAL HIGH (ref 70–99)
Glucose-Capillary: 128 mg/dL — ABNORMAL HIGH (ref 70–99)
Glucose-Capillary: 130 mg/dL — ABNORMAL HIGH (ref 70–99)
Glucose-Capillary: 131 mg/dL — ABNORMAL HIGH (ref 70–99)
Glucose-Capillary: 135 mg/dL — ABNORMAL HIGH (ref 70–99)
Glucose-Capillary: 161 mg/dL — ABNORMAL HIGH (ref 70–99)
Glucose-Capillary: 167 mg/dL — ABNORMAL HIGH (ref 70–99)
Glucose-Capillary: 142 mg/dL — ABNORMAL HIGH (ref 70–99)

## 2010-11-24 LAB — CBC
MCHC: 33.6 g/dL (ref 30.0–36.0)
MCV: 93.9 fL (ref 78.0–100.0)
Platelets: 192 10*3/uL (ref 150–400)
RBC: 5.21 MIL/uL (ref 4.22–5.81)

## 2010-11-24 LAB — DIFFERENTIAL
Eosinophils Relative: 2 % (ref 0–5)
Lymphocytes Relative: 46 % (ref 12–46)
Lymphs Abs: 2.4 10*3/uL (ref 0.7–4.0)

## 2010-11-24 LAB — RAPID URINE DRUG SCREEN, HOSP PERFORMED
Amphetamines: NOT DETECTED
Barbiturates: NOT DETECTED
Benzodiazepines: NOT DETECTED
Cocaine: POSITIVE — AB
Opiates: NOT DETECTED

## 2010-11-25 NOTE — Op Note (Signed)
NAMEQUANDARIUS, Todd [name]              ACCOUNT NO.:  [id number]  MEDICAL RECORD NO.:  [id number]           PATIENT TYPE:  O  LOCATION:  DAYL                         FACILITY:  [facility]  PHYSICIAN:  Adolph Pollack, M.D.DATE OF BIRTH:  07-Sep-1955  DATE OF PROCEDURE:  11/12/2010 DATE OF DISCHARGE:                              OPERATIVE REPORT   PREOPERATIVE DIAGNOSIS:  Ventral incisional hernia.  POSTOPERATIVE DIAGNOSIS:  Ventral incisional hernia.  PROCEDURE:  Laparoscopic ventral incisional hernia repair with mesh.  SURGEON:  Adolph Pollack, M.D.  ANESTHESIA:  General.  INDICATION:  Todd [name] is a 55 year old male who underwent a robotic- assisted prostatectomy in March 2011.  He had developed a wound infection at the extraction site and then developed an incisional hernia.  He was becoming more symptomatic and now he presents for repair.  The procedure, risks and aftercare were discussed with him preoperatively.  He is on Coumadin and his preoperative INR is 1.0.  TECHNIQUE:  He was seen in the holding area then voided.  He was brought to the operating room, placed supine on the operating table and given a general anesthetic.  The abdominal wall hair was clipped and then the abdominal wall widely sterilely prepped and draped.  He was placed in the slight reverse Trendelenburg position.  Local anesthetic was infiltrated in a small area in the left upper quadrant subcostal region.  A small left subcostal incision was made and using a 5-mm Opti-Vu trocar, I gained access to the peritoneal cavity and the pneumoperitoneum was created.  Inspection of the area under the trocar demonstrated no evidence of bleeding or organ injury.  Following this, I was able to visualize the hernia in the periumbilical region containing omentum.  I placed a 5-mm trocar in the left mid lateral abdomen and began retracting the omentum free from the hernia sac.  I then placed an 11-mm trocar in  the right upper quadrant and held countertraction on the omentum as I dissected it free from the hernia sac sharply and with blunt dissection and electrocautery.  Once the omentum was completely reduced, I inspected it and no bleeding was noted.  Following this, a 5-mm trocar was added to the right mid lateral abdomen.  I then identified the rim of the hernia using a spinal needle and measured 4 cm away from this.  A piece of 15 cm x 20 cm Parietex composite mesh was brought into the field.  It was cut to the appropriate size and then four anchoring sutures of #1 Novafil were placed in the 12 o'clock, 3 o'clock, 6 o'clock and 9 o'clock positions. I then hydrated the mesh and placed it into the abdominal cavity through the 11-mm trocar.  I positioned the mesh to the nonadherent barrier where it was facing the viscera.  I then made stab incisions at 12 o'clock, 3 o'clock, 6 o'clock and 9 o'clock positions around the area of the hernia and brought up the anchoring sutures across the fascial bridge and tied these down initially anchoring the mesh to the anterior abdominal wall with the rough side facing the anterior abdominal wall. I  then further anchored the mesh to the abdominal wall with an outer and inner rim of spiral tacks.  This provided for good coverage of the hernia with good overlap.  Following this, I then did a four-quadrant inspection and a central inspection and no bleeding or organ injury was noted.  I then released the CO2 pneumoperitoneum and watched the viscera approximate the mesh and then the trocars were removed.  All skin incisions were then closed with 4-0 Monocryl subcuticular stitches.  Steri-Strips and sterile dressings were applied.  He tolerated the procedure well without any apparent complications and was taken to the recovery room in satisfactory condition.     Adolph Pollack, M.D.     Kari Baars  D:  11/12/2010  T:  11/12/2010  Job:   [id number]  cc:   Adolph Pollack, M.D. [address] [place] [address]  Merlene Laughter. Renae Gloss, M.D. Fax: [phone]  Heloise Purpura, MD Fax: [phone]  Electronically Signed by Avel Peace M.D. on 11/25/2010 04:57:47 PM

## 2010-12-10 NOTE — Discharge Summary (Signed)
  Todd [name], Todd [name]              ACCOUNT NO.:  [id number]  MEDICAL RECORD NO.:  [id number]           PATIENT TYPE:  I  LOCATION:  1433                         FACILITY:  [facility]  PHYSICIAN:  Adolph Pollack, M.D.DATE OF BIRTH:  07/23/1956  DATE OF ADMISSION:  11/12/2010 DATE OF DISCHARGE:  11/19/2010                              DISCHARGE SUMMARY   FINAL DISCHARGE DIAGNOSIS:  Ventral incisional hernia.  SECONDARY DIAGNOSES: 1. Diabetes mellitus. 2. Venous thromboembolism. 3. Depression with psychotic features. 4. Hypertension. 5. Alcohol abuse. 6. Over sedation secondary to PCA.  PROCEDURE:  Laparoscopic ventral incisional hernia repair with mesh; November 12, 2010.  REASON FOR ADMISSION:  This is a 54-year male who underwent robotic- assisted prostatectomy in March 2011 and developed a wound infection at the extraction site in the periumbilical region.  He subsequently developed a symptomatic incisional hernia that is getting larger and he was admitted for repair.  His Coumadin had been held, his INR was normal.  HOSPITAL COURSE:  He underwent a laparoscopic ventral incisional repair with mesh on November 12, 2010.  Postoperatively, he was having problems with pain control issues and hypertension and he was started on intravenous pain medication and labetalol.  Eventually I had to put him on a PCA.  Because of his history of alcohol abuse, we gave him 2 beers with each meal and restarted his Coumadin on postop day #4 and started encouraging wearing an abdominal binder.  The incisions were clean and intact.  He did have some swelling in the dependent areas but this resolved over time.  Pain control was continued to be an issue but this slowly improved and we were able to advance his diet and by November 19, 2010, he was ready to be discharged.  DISPOSITION:  Discharged home, November 19, 2010.  He was given specific discharge instructions including activity restrictions and would  resume his all medications including his Coumadin.  He is to see Dr. Renae Gloss in the office to have his INR checked.  He was given Percocet for pain and will follow up in our office in about 2 to 3 weeks.     Adolph Pollack, M.D.     Todd [name]  D:  12/08/2010  T:  12/08/2010  Job:  [id number]  cc:   Adolph Pollack, M.D. [address] [place] [address]  Merlene Laughter. Renae Gloss, M.D. Fax: [phone]  Heloise Purpura, MD Fax: [phone]  Electronically Signed by Avel Peace M.D. on 12/10/2010 10:39:40 AM

## 2010-12-29 NOTE — H&P (Signed)
Todd [name], Todd [name]              ACCOUNT NO.:  [id number]   MEDICAL RECORD NO.:  [id number]          PATIENT TYPE:  INP   LOCATION:  1416                         FACILITY:  [facility]   PHYSICIAN:  Arne Cleveland, MD       DATE OF BIRTH:  1955/09/13   DATE OF ADMISSION:  03/11/2009  DATE OF DISCHARGE:  03/13/2009                              HISTORY & PHYSICAL   PRIMARY CARE PHYSICIAN:  The patient basically is followed by [facility], but really does not have one particular doctor, but  [facility], so copy of this history and physical should be  sent to them.   HISTORY OF PRESENT ILLNESS:  [name] is a 55 year old Afro-American  male who presents with a chief complaint of abdominal pain of 1-day  duration.  He states that the pain started last night, and he was  suffering with it throughout the night and came into emergency room this  morning, as he was not getting any better.  The pain was very severe.  He has a history of being discharged from [facility] a week  ago with discharge diagnoses of chest pain in the setting of cocaine  abuse, abdominal pain, polysubstance abuse, depression with psychotic  features, diabetes, adrenal adenoma, diverticulosis, mild elevated liver  function test, bifascicular block, tobacco abuse, and hypertension.   DISCHARGE MEDICATIONS:  He was discharged on:  1. Aspirin 81 mg daily.  2. Effexor 150 mg daily.  3. Metformin 500 mg twice a day.  4. Trazodone 50 mg at bedtime.  5. Norvasc 10 mg daily.  6. Percocet 5/325, 1-2 tablets every 4 hours as needed.   WORKUP:  He had a CT of the abdomen, which showed bilateral adrenal  adenoma, single hypodense lesion in the segment of the liver technically  nonspecific due to the small size; however, this is obviously felt to be  benign, bilateral hypodense renal lesions likely representing cyst,  although most are too small to characterize borderline adenopathy in the  porta hepatis upper peripancreatic region, distal colonic diverticulosis  without diverticulitis.  Chest x-ray was stable.  He had a 2-dimensional  echocardiogram, which showed that the left ventricular wall thickness  increased and that he had some diastolic dysfunction.  There was mild  mitral valve regurgitation.  As far as his cocaine abuse, the patient is  very forthright about the use of cocaine.  He said he was motivated to  discontinue use and he was referred to [facility].  He was  continued on his depression medicine, and his diabetes was well  controlled with diet.  The patient was recommended that he follow up  with a cardiologist because he had bifascicular block and the LVH, but  it was felt that the patient was stable to follow up for his heart as an  outpatient.  The patient was doing fine after being discharged on March 13, 2009, and until the last night when he had the abdominal pain, came  to the emergency room here.  Workup in the emergency room was seen by  Surgery who felt the patient had acute cholecystitis.  He had a complete  blood count, white count was 9800, hemoglobin 16.2, and hematocrit 47.6.  His chemistry panel was normal, except for a glucose 181 mg/dL.  Liver  tests were normal.  His lipase was normal.  CPK, troponin, or cardiac  panel was normal.  Urinalysis was normal.  Drug screen was negative for  cocaine, only positive was opiates, but he had received Dilaudid prior  to doing the drug screen.  Abdominal ultrasound was done and showed  cholelithiasis with wall thickening and sonographic Murphy sign being  positive.  Findings were most consistent with acute cholecystitis, mild  prominence of the common bile duct.  The patient was seen by Surgery,  started on antibiotic, was told by the surgeon to have the surgery but  he wanted a second opinion, and we were called to admit the patient and  get a second opinion.  These findings were discussed with  the patient  and after discussing the findings, surgeon's recommendations and that I  agreed with the recommendation.  The patient was agreeable to surgery  and consulted Dr. Freida Busman, the surgeon, and he will be consulting and  proceeding with the surgery.   PAST MEDICAL HISTORY:  Significant for:  1. Diabetes type 2 controlled with metformin and diet.  2. Depression.  3. History of substance abuse.  4. Obesity.   PAST SURGICAL HISTORY:  Right knee surgery and left foot surgery.   SOCIAL HISTORY:  The patient is retired due to disability from his knee.  He is single.  He smokes 1-2 cigarettes per week and has been smoking  for approximately 40 years.  He drinks on social occasion.  He uses  recreational drugs, cocaine, last time he used was approximately 2-1/2  weeks ago.   FAMILY HISTORY:  His father is 53 with arthritis is in good health.  His  mother died at 27 from lupus.  He has got 3 brothers and 3 sisters, all  in good health.   ALLERGIES:  No known drug allergies.   MEDICATIONS:  He takes metformin, Vicodin, and Mobic.  He was given  Norvasc, but he did not get that filled, but he should be on the Norvasc  10 mg for his high  blood pressure.   REVIEW OF SYSTEMS:  Other than the 2 bouts of abdominal pain, 1 at  [facility] a week ago and this one, the patient had been doing well in  his usual state of health, other than his chronic medical problems.  All  systems were reviewed.   PHYSICAL EXAMINATION:  VITAL SIGNS:  His current blood pressure is  135/86, pulse 83, respirations 19, and temperature 97.5.  GENERAL:  He is well-developed, well-nourished, obese Afro-American male  in moderate distress secondary to abdominal pain.  HEENT:  Head is atraumatic and normocephalic.  Eyes, pupils are equal,  round, and reactive to light.  Discs are sharp.  Extraocular muscle  range of motion is full.  External ear canal, tympanic membrane appear  normal.  Oropharynx, mucous  membranes are dry.  There are no erythema or  lesions noted.  NECK:  Supple without jugular venous distention, thyromegaly, or thyroid  mass.  CHEST:  Clear to auscultation and percussion.  HEART:  Regular rhythm and rate.  Normal S1 and S2 without murmur,  gallop, or rub.  ABDOMEN:  He has obese abdomen.  He has got right upper quadrant and  epigastric  tenderness with positive Murphy sign.  His abdomen is soft.  There is no hepatosplenomegaly.  There is no palpable mass.  Bowel  sounds are normoactive.  GENITALIA:  Normal.  RECTAL:  Deferred.  The patient had already been examined by the surgeon  and now is refusing the rectal exam.  EXTREMITIES:  There is no clubbing, cyanosis, or edema.  NEUROLOGIC:  Cranial nerves II through XII are normal.  Motor strength  5/5 in upper and lower extremities.  Sensory exam, intact to fine touch  and pinprick.  Reflexes 0-2 throughout.  LYMPH NODES:  Cervical, axillary, and inguinal lymph nodes are normal.  SKIN:  No unusual rash or lesion.   LABORATORY DATA:  The patient's white count as discussed before white  count is 9800, hemoglobin 16.2, hematocrit 47.2.  Chemistries normal  except for his glucose of 181.  Urinalysis is negative on diff.  Abdominal ultrasound is consistent with acute cholelithiasis with wall  thickening and sonographic positive Murphy sign.   IMPRESSION:  1. Acute cholecystitis.  2. History of polysubstance abuse.  3. Diabetes.  4. Hypertension.  5. Obesity.  6. Depression.  Currently, his medical problems except for his      hypertension are under control due to noncompliance, not taking his      hypertension medicine.   PLAN:  My plan is to admit the patient to surgical bed.  Obtain a  surgical consult by Dr. Freida Busman, make the patient n.p.o., control his  pain, we will order IV morphine 4 mg every 3 hours.  Pain control and  management postop will be up to Surgery.  Once he is able to take p.o.  medicines, I will put  him back on the discharge medicines that he was  discharged, which were the metformin, the Effexor.  We will switch him  from IV Protonix to p.o. Protonix and the Norvasc 10 mg.  He will be put  on a sliding scale with Accu-Cheks every 4 hours prior to his surgery.  Further evaluation and treatment as indicated by hospital course.  This  history and physical is complete.  The patient is being admitted to  [facility].      Arne Cleveland, MD  Electronically Signed     ML/MEDQ  D:  03/22/2009  T:  03/23/2009  Job:  [id number]   cc:   [facility]

## 2010-12-29 NOTE — H&P (Signed)
Todd [name], Todd [name]              ACCOUNT NO.:  [id number]   MEDICAL RECORD NO.:  [id number]          PATIENT TYPE:  INP   LOCATION:  0104                         [facility]ACILITY:  [facility]   PHYSICIAN:  Peggye Pitt, M.D. DATE OF BIRTH:  09/29/1955   DATE OF ADMISSION:  03/11/2009  DATE OF DISCHARGE:                              HISTORY & PHYSICAL   PRIMARY CARE PHYSICIAN:  He is unassigned.   CHIEF COMPLAINT:  Chest pain and abdominal pain.   HISTORY OF PRESENT ILLNESS:  Todd [name] is a 55 year old obese African  American man with past medical history significant for type 2 diabetes  as well as depression with psychotic features.  He is status post  multiple admissions here to [facility] for this issue.  He states  that on Sunday, he had cocaine and was feeling fine until yesterday  afternoon when he went to a dinner at church.  After that,  he woke up  this morning with severe abdominal pain in the epigastric area as well  as chest pain and some shortness of breath.  He thought it was some gas,  took 2 Beano because his abdominal pain and chest pain failed to  resolve.  Then he started to develop some shortness of breath.  He  decided to come to the hospital for further evaluation and management.  He denies any fevers, chills, nausea, vomiting, diarrhea or any other  symptoms.   ALLERGIES:  He has no known drug allergies.   PAST MEDICAL HISTORY:  Significant for tobacco abuse, cocaine abuse,  depression with psychotic features.  Type 2 diabetes mellitus.   HOME MEDICATIONS:  1. Effexor 150 mg daily.  2. Metformin at an unknown dose.  3. Trazodone at an unknown dose.   SOCIAL HISTORY:  He lives by himself.  Is divorced, has two children.  Drinks maybe 3 times a week and does cocaine occasionally.  Smokes 3 to  4 cigarettes a day and has done so for many years.   Family history is noncontributory.   Review of systems is negative except as already mentioned on HPI.   PHYSICAL EXAMINATION:  VITAL SIGNS UPON ADMISSION:  Blood pressure  118/45, heart rate 75, respirations 22, O2 sats 94 to 96% on room air  with a temperature of 99.5.  GENERAL:  He is alert, awake, oriented x3.  Very talkative.  Does not  appear to have been in any distress.  HEENT: Normocephalic, atraumatic.  Pupils are equal, reactive to light  and accommodation with intact extraocular movements.  NECK:  Supple.  No JVD.  No lymphadenopathy.  No bruits.  No goiter.  HEART:  Regular rate and rhythm with no murmurs, rubs or gallops.  LUNGS:  Clear to auscultation bilaterally.  His abdomen is obese, soft, slightly tender to palpation to the  epigastric and left upper quadrant.  EXTREMITIES:  He has no edema.  Positive pulses.  NEUROLOGIC:  Grossly intact and nonfocal.   LABS ON ADMISSION:  Sodium 137, potassium 3.9, chloride 105, bicarb 26,  BUN 14, creatinine 0.90, glucose of 208.  WBC 6.7, hemoglobin of  15.6,  platelet count of 202.  Two sets of point-of-care markers that are  negative.  UDS is positive for cocaine.   EKG shows normal sinus rhythm at a rate of 82 with a right bundle branch  block as well as the left anterior fascicular block.   An acute abdominal series that showed nonspecific bowel gas pattern with  clear lungs.   A CT scan of the abdomen and pelvis that shows small bilateral adrenal  adenomas with bilateral cystic renal lesion, diverticulosis but no  evidence for diverticulitis.   His lipase is 19.   ASSESSMENT AND PLAN:  1. For his chest pain likely secondary to cocaine use:  At this point,      we will proceed to rule out acute coronary syndrome with EKGs and      cardiac enzymes, given his EKG abnormalities.  We will also proceed      with a 2-D echocardiogram.  Further management will depend on his      echocardiogram and enzyme results.  At this point, will admit him      to telemetry for observation.  2. For his epigastric pain with his history of  alcohol, I wonder if he      may have gastroesophageal reflux disease versus peptic ulcer      disease.  Will start him on twice-daily PPI.  His lipase has been      negative.  We will check an FOBT.,  His hemoglobin is stable at      this point.  Other possibility is some mild ischemic mesenteric      ischemia secondary to his cocaine abuse.  3. For his substance abuse, we will place him on the alcohol-      withdrawal protocol, thiamine as well as a multivitamin and will      provide cessation counseling.  4. For prophylaxis while in the hospital, he will be on Protonix for      GI prophylaxis and Lovenox for DVT prophylaxis.      Peggye Pitt, M.D.  Electronically Signed     EH/MEDQ  D:  03/11/2009  T:  03/11/2009  Job:  [id number]

## 2010-12-29 NOTE — H&P (Signed)
Todd [name], Todd [name]              ACCOUNT NO.:  [id number]   MEDICAL RECORD NO.:  [id number]          PATIENT TYPE:  IPS   LOCATION:  0508                          [facility]ACILITY:  [facility]   PHYSICIAN:  Geoffery Lyons, M.D.      DATE OF BIRTH:  [birth year]   DATE OF ADMISSION:  01/03/2009  DATE OF DISCHARGE:                       PSYCHIATRIC ADMISSION ASSESSMENT   IDENTIFYING INFORMATION:  [name] admitted Jan 02, 2009.   HISTORY OF PRESENT ILLNESS:  The patient reports a history of depression  and feeling very tired, not motivated to do anything, isolating and not  caring for himself.  Reports problems with his social environment.  He  also repeat admits to alcohol and cocaine use to cope with his  depression.   PAST PSYCHIATRIC HISTORY:  The patient has been here prior, was seeing  Dr. Evelene Croon for outpatient mental health services.   SOCIAL HISTORY:  Considers himself homeless.  He is on disability.  Has  two young children that he states he is unable to see.   FAMILY HISTORY:  Unknown.   ALCOHOL AND DRUG HISTORYI:  Has been using alcohol and cocaine.   PRIMARY CARE [facility]:  Olene Craven, M.D.   MEDICAL PROBLEMS:  A history of knee pain and diabetes.   MEDICATIONS:  1. Effexor XR 150 mg b.i.d.  2. Metformin 500 mg b.i.d.  3. Vistaril 25 mg p.r.n.  4. Mobic 50 mg daily.  5. Ultram 50 mg and trazodone 50 mg h.s.   DRUG ALLERGIES:  PENICILLIN.   PHYSICAL EXAMINATION:  GENERAL:  This is a well-developed, well-  nourished male in no acute distress.  He was fully assessed at [facility] emergency department.  The patient did receive trazodone,  Glucophage Ultram and Atarax.  His physical exam was reviewed with no  significant findings.  It does state the patient was reporting violent  thoughts very calmly and cheerfully.  VITAL SIGNS:  Temperature 98.1, 75 heart rate, 20 respirations, blood  pressure is 140/104, 96% saturated.   LABORATORY DATA:  CBC within normal  limits.  Alcohol level less than 5.  Urine drug screen is positive for cocaine.   MENTAL STATUS EXAM:  He is fully alert and cooperative with fair eye  contact.  His speech is clear, normal pace and tone.  The patient's mood  is depressed.  The patient affect is flat.  Thought processes are  coherent, goal directed, cognitive function intact.  Memory is good.  Judgment insight is partial.  AXIS I:  Mood disorder, polysubstance abuse.  AXIS II:  Deferred.  AXIS III.  Non insulin-dependent diabetes and knee pain.  AXIS IV:  Problems of social environment, housing, other psychosocial  problems.  AXIS V:  Current is 35-40.   Plan to contract for safety.  We will check CBG's on a b.i.d. basis.  Resume his Effexor, Mobic and trazodone for sleep.  Will identify  support group.  Case manager will assess his living situation, will  reinforce medication compliance and relapse.  Tentative length of stay  is 3-4 days.      Liborio Nixon  Orsini, N.P.      Geoffery Lyons, M.D.  Electronically Signed    JO/MEDQ  D:  01/13/2009  T:  01/13/2009  Job:  [id number]

## 2010-12-29 NOTE — Discharge Summary (Signed)
Todd [name], Todd [name]              ACCOUNT NO.:  [id number]   MEDICAL RECORD NO.:  [id number]          PATIENT TYPE:  INP   LOCATION:  1416                         FACILITY:  [facility]   PHYSICIAN:  Hillery Aldo, M.D.   DATE OF BIRTH:  30-Jul-1956   DATE OF ADMISSION:  03/11/2009  DATE OF DISCHARGE:                               DISCHARGE SUMMARY   PRIMARY CARE PHYSICIAN:  Dr. Iran Planas at Dr. Loney Laurence prior practice.   CARDIOLOGIST:  Dr. Eden Emms.   DISCHARGE DIAGNOSES:  1. Chest pain in the setting of cocaine abuse.  2. Abdominal pain.  3. Polysubstance abuse.  4. Depression with psychotic features.  5. Diabetes mellitus.  6. Adrenal adenoma.  7. Diverticulosis.  8. Mild elevated liver function tests.  9. Bifascicular block.  10.Tobacco abuse.  11.Hypertension.   DISCHARGE MEDICATIONS:  1. Aspirin 81 mg p.o. daily.  2. Effexor 150 mg p.o. daily.  3. Metformin 500 mg p.o. b.i.d.  4. Trazodone 50 mg p.o. q.h.s.  5. Norvasc 10 mg p.o. daily.  6. Percocet 5/325 1-2 tablets p.o. q.4 h p.r.n. (#30 written).   CONSULTATIONS:  None.   BRIEF ADMISSION HISTORY OF PRESENT ILLNESS:  The patient is a 55-year-  old male who presented to the hospital with atypical chest pain as well  as left upper quadrant abdominal pain radiating to the flank.  He was  admitted to rule out acute coronary syndrome given his history of recent  cocaine abuse.  For full details, please see the dictated report done by  Dr. Ardyth Harps.   PROCEDURES AND DIAGNOSTIC STUDIES.:  1. Acute abdominal series on March 11, 2009 showed no specific bowel      gas abnormality identified.  Lungs are clear.  2. CT scan of the abdomen and pelvis on March 11, 2009 showed small      bilateral adrenal adenomas.  Single hypodense lesion in segment 4A      of the liver, technically nonspecific due to small size.  However,      this is statistically felt to be benign.  Bilateral hypodense renal      lesions, likely representing  cysts although most are too small to      characterize.  Borderline adenopathy along the porta hepatis/upper      peripancreatic region.  Distal colonic diverticulosis without      active diverticulitis.  3. Chest x-ray on March 12, 2009 was stable with no active lung      disease.  4. Two-dimensional echocardiogram on March 13, 2009 showed a normal      left ventricle cavity size.  Wall thickness was increased in a      pattern of mild LVH.  Systolic function was normal with an ejection      fraction estimated at 60-70%.  Wall motion was normal.  No regional      wall motion abnormalities.  Features were consistent with a pseudo-      normal left ventricular filling pattern with concomitant abnormal      relaxation and increased filling pressure (grade 2 diastolic  dysfunction).  There was mild mitral valve regurgitation.  For the      full details, please see the dictated report in E-chart.   DISCHARGE LABORATORY VALUES:  Sodium was 138, potassium 4.2, chloride  105, bicarb 25, BUN 11, creatinine 0.80, glucose 106, calcium 9.6.  Total bilirubin was 0.6, alkaline phosphatase 42, AST 35, ALT 55, total  protein 7.1, albumin 3.7.  Cardiac markers were negative x3.  CMP was  205.  White blood cell count was 5.2, hemoglobin 15.3, hematocrit 44.6,  platelets 206,000.  Lipid profile showed a cholesterol of 128,  triglycerides 59, HDL 45, and LDL 71.  TSH was normal at 0.773.  Urine  drug screen was positive for cocaine.   HOSPITAL COURSE:  1. Chest pain in the setting of cocaine abuse:  The patient was      admitted and the chest pain was noted to be markedly atypical and      mostly he complained of upper abdominal pain.  It was not felt to      be cardiac but cardiac markers were cycled q.8 hours and other than      a nonspecific CK elevation, were negative.  The patient's pain was      nonexertional.  Given that he does have EKG abnormalities with a      bifascicular block and he does  have concentric left ventricular      hypertrophy with risk factors, the patient was referred to Dr.      Eden Emms of cardiology as an outpatient.  Although it was explained      to the patient that ideally he should be evaluated as an inpatient,      the patient preferred to be discharged home with outpatient      followup.  Given that he is relatively stable, I do not see this as      a problem.  However, he has been fully instructed regarding the      importance of returning to the hospital if he develops any further      chest pain.  2. Left upper quadrant abdominal pain:  No specific etiology was ever      found.  He was placed on proton pump inhibitor therapy for the      possibility of gastritis.  This may simply represent transient      mesenteric ischemia from cocaine abuse.  Nevertheless, the      patient's pain is currently controlled and we are discharging home.  3. Polysubstance abuse:  The patient has been very forthright about      his use of cocaine and is motivated to discontinue all use.  We      will have social worker refer him to ABS upon discharge.  4. History of depression with psychotic features:  The patient's mood      has been stable.  He is not having any evidence of psychosis while      in the hospital.  5. Diabetes:  The patient's diabetes appears to be well controlled      with diet.  He is not on any outpatient medications for this.  His      hemoglobin A1c is 6.3% which corresponds to a mean plasma glucose      of 134.  6. Bilateral adrenal adenomas:  No active issue while in the hospital.  7. Diverticulosis:  No evidence of diverticulitis.  8. Mild elevated liver function studies:  Thought to  be secondary to      cocaine abuse, possibly acute diastolic heart failure related to      hypertension.  Nevertheless, the patient's transaminitis is      resolving without any specific intervention.  9. Bifascicular block.  The patient has had multiple EKGs which  showed      bifascicular block.  There are no ST elevations or T-wave      inversions.  He does have septal Q-waves in V2 which could be      consistent with an old MI.  Nevertheless, given the patient's risk      factors, recent cocaine abuse, and abnormalities on EKG as well as      two-dimensional echocardiography, he has been referred to Dr.      Eden Emms with a followup appointment scheduled on April 14, 2009 at      11:45 a.m.  10.Tobacco abuse:  The patient was counseled regarding cessation and      provided with a nicotine patch while in the hospital.  11.Hypertension:  The patient does have hypertension and given his      LVH, this likely has been untreated.  Will start him on Norvasc and      discharge him on this medication.  Would avoid beta blockers until      he has demonstrated that he can avoid cocaine.   DISPOSITION:  The patient is medically stable and will be discharged  home per his request.  He is instructed to follow up with his primary  care physician in 1-2 weeks.  He is encouraged to keep his appointment  with Dr. Eden Emms on April 14, 2009.  He has been counseled regarding the  importance of avoiding cocaine use given his underlying heart issues.   Time spent coordinating care for discharge and discharge instructions  equals 35 minutes.      Hillery Aldo, M.D.  Electronically Signed     CR/MEDQ  D:  03/13/2009  T:  03/13/2009  Job:  [id number]   cc:   Dr Kara Dies C. Eden Emms, MD, [facility]  [address]  [address]  [place]  [place] [address]

## 2010-12-29 NOTE — Op Note (Signed)
NAMEKENZEL, RUESCH              ACCOUNT NO.:  [id number]   MEDICAL RECORD NO.:  [id number]          PATIENT TYPE:  INP   LOCATION:  5151                         FACILITY:  [facility]   PHYSICIAN:  Lennie Muckle, MD      DATE OF BIRTH:  Apr 09, 1956   DATE OF PROCEDURE:  03/22/2009  DATE OF DISCHARGE:                               OPERATIVE REPORT   PREOPERATIVE DIAGNOSIS:  Acute cholecystitis.   POSTOPERATIVE DIAGNOSIS:  Acute cholecystitis.   PROCEDURE:  Laparoscopic cholecystectomy.   SURGEON:  Amber L. Freida Busman, MD   ASSISTANT:  Gaynelle Adu, MD.   ANESTHESIA:  General endotracheal anesthesia.   FINDINGS:  Inflamed, thickened gallbladder and multiple stones.   SPECIMENS:  Gallbladder.   BLOOD LOSS:  Approximately 100 mL.   COMPLICATIONS:  No immediate complications.   DRAINS:  No drains were placed.   INDICATIONS FOR PROCEDURE:  Mr. Padin is a 55 year old male who began  having abdominal pain yesterday.  It is acute in nature, unrelenting,  had some mild nausea, had a persistent pain 10/10.  Ultrasound revealed  a thickened gallbladder wall.  Examination did seem consistent with  acute cholecystitis.  I talked with the patient in the emergency  department.  He was somewhat reluctant to have surgery.  He did get a  second opinion from Dr. Shawnie Dapper and was, therefore, agreeable to have  surgery.  I did talk to the patient about risks of surgery including an  open surgery; injury to common bile duct, liver, and other organs;  bleeding; infection were explained.  Informed consent was obtained.   DETAILS OF PROCEDURE:  Ms. Pieper was identified in the preoperative  holding area.  He had been received Unasyn in the emergency department  and was taken to the operating room in supine position.  After  administration of general endotracheal anesthesia,  SCDs were applied to  the lower extremity.  Abdomen was prepped and draped in the usual  sterile fashion.  A time-out indicating the  patient and procedure were  performed.  I placed an incision below the umbilicus using Optiview and  placed an 11-mm trocar in the abdomen.  All layers of abdominal wall  were visualized upon entry.  After insufflation, I inspected the abdomen  and found no evidence of injury during placement of trocar.  I placed 3  trocars in upper abdomen under visualization with a camera.  The  gallbladder was distended.  Adhesions were taken down with blunt  dissection.  It was aspirated with the irrigator device.  We grasped the  fundus, retracted and held the patient.  Dissected down the infundibulum  with the suction irrigator and Maryland forceps.  Obtained a critical  view of the cystic duct and artery.  Cystic duct was clipped and  divided, and the cystic artery was also clipped and divided.  Remaining  peritoneal attachments were divided with electrocautery.  Specimen was  placed in an EndoCatch bag and removed from the abdomen.  There was a  small amount of bilious spillage during the procedure.  I irrigated the  wound bed, found  no evidence of injury, no evidence of bile leakage.  Clips were placed on the cystic ducts and artery.  I then closed the  fascial defect of the umbilicus using 0 Vicryl suture on the suture  passer.  It was adequately closed.  I inspected the abdomen and found no  evidence of injury or bleeding.  I removed the trocars after releasing  the pneumoperitoneum.  Skin was closed with 4-0 Monocryl.  Steri-Strips  was placed for final dressing.  The patient was extubated and  transported to postanesthesia care unit in stable condition.      Lennie Muckle, MD  Electronically Signed     ALA/MEDQ  D:  03/22/2009  T:  03/22/2009  Job:  [id number]

## 2010-12-29 NOTE — Consult Note (Signed)
Todd [name], Todd [name]              ACCOUNT NO.:  [id number]   MEDICAL RECORD NO.:  [id number]          PATIENT TYPE:  INP   LOCATION:  5151                         FACILITY:  [facility]   PHYSICIAN:  Lennie Muckle, MD      DATE OF BIRTH:  April 15, 1956   DATE OF CONSULTATION:  DATE OF DISCHARGE:                                 CONSULTATION   [name] is a 55 year old male whom I was asked to see by Dr. Denton Lank  for cholecystitis.  Apparently, he began having abdominal pain last  night approximately 6 o'clock.  He states he had eaten boiled chicken.  The pain was approximately 10/10, it was unrelenting.  He does have  crescendo and decrescendo episodes.  He has no fevers but occasional  chills.  He came to the emergency department due to the amount of his  pain.  He has some mild nausea.  The pain is located in his epigastric  region.  It does radiate to the right side of his abdomen.  He has no  back pain.  He had complained of some pain in his scrotum.  He has no  difficulty with voiding or urination.  He has had some mild diarrhea.  He did have an ultrasound which revealed stones within the gallbladder  and thickened gallbladder wall and white count was normal at 9.8.  He  has no history of reflux or ulcer disease.   PAST MEDICAL HISTORY:  Significant for diabetes.   FAMILY HISTORY:  Noncontributory.   SOCIAL HISTORY:  He does smoke tobacco, approximately 1 pack every 4  days.  Occasional alcohol.  He does occasionally use cocaine, and he  thinks he used 2 or 3 days ago and does use opioids.   ALLERGIES:  No drug allergies.   MEDICATIONS:  Metformin, Vicodin, and Mobic.   REVIEW OF SYSTEMS:  He has occasional pain in his scrotum, otherwise,  negative.   PHYSICAL EXAMINATION:  VITAL SIGNS:  Temperature 97.2, pulse 83, blood  pressure 161/81.  GENERAL:  Overall, he is an obese male, appears uncomfortable in the  emergency department.  HEENT:  Head is normocephalic.  Sclerae are  clear.  NECK:  Supple.  CHEST:  Clear to auscultation bilaterally.  CARDIOVASCULAR:  Regular rate and rhythm.  ABDOMEN:  Obese.  He is tender mostly in the right upper quadrant.  No  peritoneal signs.  He does have some tenderness on the left side of the  abdomen.  GENITOURINARY:  Not examined.  EXTREMITIES:  No deformities or edema.  MUSCULOSKELETAL:  Normal skin exam.  No rashes are seen.  NEUROLOGIC:  Normal.   Chest x-ray is negative.   OTHER LABORATORY DATA:  BUN and creatinine is 15 and 0.7.  AST and ALT  are normal at 36 and 42.  Alkaline phosphatase 47, bilirubin is 1.0.  Lipase is normal at 17.   ASSESSMENT AND PLAN:  Acute cholecystitis, cholelithiasis.  I had  strongly recommended to Mr. Gorniak that he have a cholecystectomy.  I  explained the anatomy and the procedure to the patient.  At that  time,  he refused to have the surgery.  He wanted to have another  recommendation by his primary care physician, Dr. Lahoma Rocker.  Again, I  stressed the importance of the surgery stating that he had an infected  gallbladder that I was concerned this might proceed to necrosis and  become more bothersome for him.  However, at that time he did not desire  to have surgery; therefore, the patient will be further examined by the  medical team and will call if needed.      Lennie Muckle, MD  Electronically Signed     ALA/MEDQ  D:  03/22/2009  T:  03/22/2009  Job:  [id number]

## 2010-12-30 ENCOUNTER — Encounter (INDEPENDENT_AMBULATORY_CARE_PROVIDER_SITE_OTHER): Payer: Self-pay | Admitting: General Surgery

## 2011-01-01 ENCOUNTER — Other Ambulatory Visit: Payer: Self-pay | Admitting: Gastroenterology

## 2011-01-01 DIAGNOSIS — B1920 Unspecified viral hepatitis C without hepatic coma: Secondary | ICD-10-CM

## 2011-01-01 NOTE — H&P (Signed)
Todd [name], Todd [name]                          ACCOUNT NO.:  [id number]   MEDICAL RECORD NO.:  [id number]                   PATIENT TYPE:  IPS   LOCATION:  0505                                 [facility]  [facility]   PHYSICIAN:  Jeanice Lim, M.D.              DATE OF BIRTH:  04-Nov-1955   DATE OF ADMISSION:  08/20/2003  DATE OF DISCHARGE:                                HISTORY & PHYSICAL   IDENTIFYING INFORMATION:  A 55 year old single African-American male who was  voluntarily admitted on August 20, 2003.   HISTORY OF PRESENT ILLNESS:  The patient presents with a history of  depression and psychosis.  The patient reports he has been having stress at  work.  He is very angry that his work is not being sympathetic to his  depression.  He states he works long hours.  He reports decreased sleep,  decreased appetite, denies any psychotic symptoms at this time.  He denies  any homicidal ideation.  The patient was having suicidal ideation to  overdose.  He has lost about 10-15 pounds recently.  He has been  noncompliant with his medications and has been isolating at home.   PAST PSYCHIATRIC HISTORY:  Third admission to [facility], was  hospitalized twice in the past.   SOCIAL HISTORY:  A 55 year old single African-American male, has 2 sons.  He  lives alone.  He works at [employer].  No legal problems.   FAMILY HISTORY:  Unclear.   ALCOHOL DRUG HISTORY:  He is a nonsmoker, denies any alcohol or drug use.   PAST MEDICAL HISTORY:  Primary care [name] is Dr. Kathrynn Ducking.  Medical  problems are knee problems.   MEDICATIONS:  Has been on Prozac 60 and Seroquel 200 mg.  The patient never  take the Seroquel 200 and there is some questionable compliance with his  Prozac.   DRUG ALLERGIES:  No known allergies.   PHYSICAL EXAMINATION:  Was refused per patient, very irritable when  approached to complete physical, making sarcastic remark in regards to why  the  physical examination was necessary.  The patient does appear otherwise  overweight, well-nourished.  Vital signs are stable, 98.9, 79, 16 heart  rate, blood pressure is 140/95.  He is 225 pounds.   LABORATORY DATA:  Glucose is 148, albumin 3.4, hemoglobin A1C 6.4,  cholesterol level 104, TSH is 1.031.   MENTAL STATUS EXAM:  He is an alert, very sleepy.  He is trying to be  cooperative at this time.  Speech is few words response, probably more  likely due to sedation.  Mood is sleepy, he is cooperative and sleepy.  Thought processes:  He does not appear to be distracted.  The patient is  aware of self and his situation.  Difficult to assess memory and insight.   ADMISSION DIAGNOSES:   AXIS I:  Major depressive disorder.   AXIS II:  Deferred.   AXIS III:  Knee problems.   AXIS IV:  Problems with occupation and other psychosocial problems.   AXIS V:  Current is 25-30, estimated this past year 65-70.   PLAN:  Voluntary admission for suicidal ideation.  Contract for safety,  check every 15 minutes.  Will stabilize mood and thinking, will resume his  medications.  Medication compliance will be discussed with the patient as he  is completing his hospitalization.  Will increase coping skills by attending  groups.  The patient is to follow up with mental health, to be medication  compliant after discharge.   TENTATIVE LENGTH OF CARE:  3-5 days.     Landry Corporal, N.P.                       Jeanice Lim, M.D.    JO/MEDQ  D:  08/30/2003  T:  08/30/2003  Job:  [id number]

## 2011-01-01 NOTE — H&P (Signed)
NAMETYRAN, HUSER              ACCOUNT NO.:  [id number]   MEDICAL RECORD NO.:  [id number]          PATIENT TYPE:  IPS   LOCATION:  0501                          [facility]ILITY[facility]  [facility]   PHYSICIAN:  Jeanice Lim, M.D. DATE OF BIRTH:  Jan 31, 1956   DATE OF ADMISSION:  04/14/2005  DATE OF DISCHARGE:                         PSYCHIATRIC ADMISSION ASSESSMENT   IDENTIFYING INFORMATION:  This is a 55 year old African-American male  voluntarily admitted 04/14/2005.   HISTORY OF PRESENT ILLNESS:  The patient presents with a history of  depression that has been increasing for the past two weeks.  The patient has  been endorsing suicidal thoughts with thoughts to walk into traffic.  Experiencing homicidal thoughts with no specific plan or person.  Per chart  the patient was recently diagnosed with diabetes and indicates per the chart  that the work has been insensitive to his disease.  The patient reports that  he has been drinking, has been using cocaine and benzodiazepines.  The  patient was feeling on edge and was concerned for his and others safety.  This information is obtained from the chart.  The patient is sedated and is  not cooperative or able to provide much information.   PAST PSYCHIATRIC HISTORY:  This is the patient's apparently first admission  to [facility]  He is an seen as an outpatient by Dr. Lolly Mustache.   SOCIAL HISTORY:  This is a 55 year old African-American male who lives  alone.  Works at the [employer].  Has a 12th grade education.   FAMILY HISTORY:  Unclear.   ALCOHOL AND DRUG HISTORY:  There has been some recent drinking and cocaine  use.   PRIMARY CARE Saadiya Wilfong:  Dr. Burnett Sheng.   MEDICAL PROBLEMS:  Non-insulin dependent diabetes mellitus, recently  diagnosed.  History of knee surgery with chronic pain.   MEDICATIONS:  1.  Glipizide 5 mg two tabs b.i.d.  2.  Metformin 500 mg one p.o. b.i.d.  The patient states he was not taking      his metformin and  was controlling his diabetes with his diet.   DRUG ALLERGIES:  No known allergies.   PHYSICAL EXAMINATION:  The patient was assessed in the emergency department.  He is in no acute distress, middle-aged male.  Temperature 98.2, heart rate  57, respirations 20, blood pressure 140/92.  He is 5 feet 8 inches tall, 225  pounds.   DATA:  Urine drug screen is positive for cocaine, positive for  benzodiazepines, SGOT is elevated at 78, SGPT 71, total bilirubin is 1.2.  Alcohol level is less than 5.   MENTAL STATUS EXAM:  He is in the bed.  He states he is too sedated to talk.  The patient reports the words, their clear.  Mood and affect were difficult  to ascertain with the limited responses of the patient.  Thought processes  were endorsing suicidal thoughts.  No apparent psychosis.  Suicidal ideation  and questionable paranoia.  The patient is a poor historian.  His judgment  is poor.  Insight limited.   DIAGNOSES:  Axis I.  Major depressive disorder, rule  out mood disorder,  polysubstance abuse.  Axis II.  Deferred.  Axis III.  Non-insulin dependent diabetes mellitus, knee problems.  Axis IV.  Problems with occupation, medical problems.  Axis V.  Current is 30, estimated last year 47.   PLAN:  Stabilize mood and thinking.  Monitor the patient's blood sugars.  Contact his primary care Mattheus Rauls.  We will monitor.  The patient will be  admitted with history of noncompliance.  The patient is to remain alcohol  and drug free.  The patient is to follow up with Dr. Burnett Sheng for medical  issues and to remain alcohol and drug free as stated.  He is to follow up  with mental health.  Tentative length of stay is 4-5 days.      Landry Corporal, N.P.      Jeanice Lim, M.D.  Electronically Signed    JO/MEDQ  D:  04/19/2005  T:  04/19/2005  Job:  [id number]

## 2011-01-01 NOTE — Discharge Summary (Signed)
Todd [name], Todd [name]              ACCOUNT NO.:  [id number]   MEDICAL RECORD NO.:  [id number]          PATIENT TYPE:  IPS   LOCATION:  0502                          [facility]ACILITY:  [facility]   PHYSICIAN:  Jeanice Lim, M.D. DATE OF BIRTH:  [birth year]   DATE OF ADMISSION:  10/03/2005  DATE OF DISCHARGE:  10/13/2005                                 DISCHARGE SUMMARY   IDENTIFYING DATA:  This is a 55 year old voluntarily admitted male reporting  racing thoughts, increased paranoid thoughts and is suspicious of others.  Felt that he might hurt somebody if he went to the post office.  Had  decreased appetite and decreased self-esteem, feeling worthless, feeling  that he has lost everything and that he had made good money and not supposed  to end up like this.  Feeling like there is no way out but suicide.   PAST PSYCHIATRIC HISTORY:  Two to three previous [facility]  admissions for depression and alcohol, possible polysubstance abuse.   ALCOHOL/DRUG HISTORY:  Reported history of alcohol use, cocaine abuse and  occasional cocaine abuse as well as a more distant history of opiate abuse.  Had seen Dr. Lolly Mustache and been noncompliant with follow-up.   MEDICAL PROBLEMS:  Non-insulin-dependent diabetes mellitus, right knee pain,  status post right knee surgery in October of 2003, diabetes poorly  controlled.   MEDICATIONS:  Had previously been on Librium, Glucotrol, thiamine, Lortab,  Symmetrel, Protonix, trazodone and Seroquel.   ALLERGIES:  No known drug allergies.   PHYSICAL EXAMINATION:  Within normal limits.  Neurologically nonfocal.   REVIEW OF SYSTEMS:  Within normal limits.   MENTAL STATUS EXAM:  Alert and oriented x3.  Good recent and remote memory.  No psychomotor abnormalities.  Cooperative.  Neatly dressed.  Speech clear,  articulate, fast paced conversation.  Mood depressed but periods of anger,  irritability, attempting to get support for his feelings whether  appropriate  or apparent need of validation, somewhat dependent as well as narcissistic  with personality features.  Thought processes constantly changing topics of  conversation, using strange analogies to describe his thinking.  Cognitively  suspicious but logical and no auditory or visual hallucinations.  No acute  suicidal ideation although feeling he likely would kill himself as well as  would be dangerous if he let himself go to the post office due to his anger  at superiors there.  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   ADMISSION DIAGNOSES:  AXIS I:  Major depressive disorder, recurrent, severe.  Rule out likely bipolar disorder, type 2.  History of polysubstance abuse.  Possible substance-induced mood disorder superimposed on bipolar disorder,  type 2.  AXIS II:  Deferred.  AXIS III:  Non-insulin-dependent diabetes mellitus, chronic knee pain and  thrombocytopenia.  AXIS IV:  Severe (stressors with primary support group, social environment,  occupation, housing, economic and medical problems).  AXIS V:  30/60.   HOSPITAL COURSE:  The patient was admitted and ordered routine p.r.n.  medications and underwent further monitoring.  Was encouraged to participate  in individual, group and milieu  therapy.  Was ordered diabetic diet,  monitored medically, optimized on medications, developed healthier coping  skills, increased insight.  Each hospitalization appears to have greater  insight regarding his condition and importance of complying with treatment,  participating fully in therapy and taking advantage of resources that are  available rather than refusing them as he did during his initial stay.  The  patient showing a high level of motivation and remarkable improvement during  his stay.   CONDITION ON DISCHARGE:  Discharged in markedly improved condition with  depressive symptoms, negative thoughts about himself, feeling helpless,  hopeless and worthless  at times but able to be future-oriented and problem-  solve and had accepted that there was addiction issues and that he needed a  residential treatment program and proactively got accepted to [facility] for dual-diagnosis treatment which  improves his prognosis significantly.  He was given medication education at  the time of discharge.   DISCHARGE MEDICATIONS:  1.  Ambien 10 mg q.h.s. if allowed at [facility].  2.  Nicoderm patch as directed.  3.  Lamictal 25 mg, 2 q.a.m.  4.  Lotrimin 1% cream topical twice a day.  5.  Trazodone 100 mg, 2 at 9 p.m.  6.  Symmetrel 100 mg b.i.d. p.r.n.  7.  Glucotrol XL 5 mg q.a.m.  8.  Glucophage 500 mg b.i.d. with meals.  9.  Protonix 40 mg q.a.m.  10. Prozac 20 mg q.a.m.  11. Motrin 600 mg q.6h. p.r.n. pain.   FOLLOW UP:  The patient is to follow up at [facility] and then to  seek individual counseling, medication monitoring, to develop an aftercare  plan which may include a three-quarter or halfway house to continue to stay  on track, pursue disability and eventually have couples counseling and  family therapy since one of his biggest priorities is to become a healthy  father and possibly husband or support for the mother of his children.   DISCHARGE DIAGNOSES:  AXIS I:  Major depressive disorder, recurrent, severe.  Rule out likely bipolar disorder, type 2.  History of polysubstance abuse.  Possible substance-induced mood disorder superimposed on bipolar disorder,  type 2.  AXIS II:  Deferred.  AXIS III:  Non-insulin-dependent diabetes mellitus, chronic knee pain and  thrombocytopenia.  AXIS IV:  Severe (stressors with primary support group, social environment,  occupation, housing, economic and medical problems).  AXIS V:  GAF on discharge 60.      Jeanice Lim, M.D.  Electronically Signed     JEM/MEDQ  D:  10/31/2005  T:  11/01/2005  Job:  [id number]

## 2011-01-01 NOTE — Discharge Summary (Signed)
Todd [name], URQUILLA                          ACCOUNT NO.:  [id number]   MEDICAL RECORD NO.:  [id number]                   PATIENT TYPE:  IPS   LOCATION:  0501                                 [facility]ACILITY:  [facility]   PHYSICIAN:  Jeanice Lim, M.D.              DATE OF BIRTH:  1956/06/26   DATE OF ADMISSION:  10/03/2002  DATE OF DISCHARGE:  10/12/2002                                 DISCHARGE SUMMARY   IDENTIFYING DATA:  This is a 55 year old African-American male voluntarily  admitted reporting homicidal ideation towards his ex-wife after she took out  a restraining order.  He was upset about being unable to see his children.  Reported isolation and significant depressive symptoms for over a month,  feeling angry, irritable, agitated with thoughts of beating up his wife,  drinking every other day as well as thoughts of going off at work with no  specific people that he would harm.   MEDICATIONS:  Prozac 60 mg, which patient had stopped and restarted two  weeks ago, Seroquel, which patient started one week ago, and Vicodin for  knee pain.   ALLERGIES:  No known drug allergies.   PHYSICAL EXAMINATION:  Essentially within normal limits.  Neurologically  nonfocal.   LABORATORY DATA:  Routine admission labs essentially within normal limits  except for SGOT and SGPT slightly elevated at 64 and 97, respectively.   MENTAL STATUS EXAM:  Obese male, agitated, irritable.  Speech somewhat  pressured.  Thought processes goal directed.  Mood angry, agitated.  Affect  somewhat labile with mild suspiciousness and possible suicidal thoughts as  well as homicidal thoughts with no acute intent.  Cognitively intact.  Judgment and insight fair.   ADMISSION DIAGNOSES:   AXIS I:  1. Major depressive disorder, recurrent, severe with psychotic features.  2. Alcohol abuse.   AXIS II:  None.   AXIS III:  1. Elevated liver enzymes.  2. Chronic right knee pain.   AXIS IV:  Moderate to severe  (conflict and limited support system).   AXIS V:  28/60.   HOSPITAL COURSE:  The patient was admitted and ordered routine p.r.n.  medications and underwent further monitoring.  Was encouraged to participate  in individual, group and milieu therapy.  The patient was placed on a  Librium p.r.n. for detox with monitoring and patient was resumed on Prozac  at a lower dose.  The patient was started on Seroquel for agitation and  irritability as well as to restore sleep and medications were titrated.  The  patient reported mood stabilizing, decrease in hostility, increase in  judgment and insight, reporting that he was able to tolerate stress.  He was  working on improving/increasing his support system and had no intention of  hurting anybody by the time of discharge, building control of his emotions.   CONDITION ON DISCHARGE:  Markedly improved.  Mood was more euthymic.  Affect  brighter.  Thought processes goal directed.  Thought content negative for  dangerous ideation or psychotic symptoms.  The patient was calm and  pleasant, looking forward to being able to return to work after further  stabilization and planned on joining a church to increase his support  system.  He also showed increased insight regarding now understanding the  importance of compliance with his medications and not stopping them in the  future when he was feeling better.   DISCHARGE MEDICATIONS:  1. Vicodin ES 1 q.6-8h. p.r.n. pain.  2. Prozac 20 mg, 3 q.a.m.  3. Seroquel 200 mg q.h.s.  4. Neurontin 100 mg t.i.d.   FOLLOW UP:  The patient was to follow up with Dr. Marlou Porch to follow up liver  function tests and Dr. Lourdes Sledge on October 22, 2002 at 1:30 p.m. as well as  start the mental health IOP on Monday, October 15, 2002 at 8:30 a.m.   DISCHARGE DIAGNOSES:   AXIS I:  1. Major depressive disorder, recurrent, severe with psychotic features.  2. Alcohol abuse.   AXIS II:  None.   AXIS III:  1. Elevated liver  enzymes.  2. Chronic right knee pain.   AXIS IV:  Moderate to severe (conflict and limited support system).   AXIS V:  Global Assessment of Functioning on discharge 55.                                                Jeanice Lim, M.D.    JEM/MEDQ  D:  10/24/2002  T:  10/24/2002  Job:  [id number]

## 2011-01-01 NOTE — Discharge Summary (Signed)
Todd [name], HODAPP                          ACCOUNT NO.:  [id number]   MEDICAL RECORD NO.:  [id number]                   PATIENT TYPE:  IPS   LOCATION:  0505                                 [facility]ACILITY:  [facility]   PHYSICIAN:  Jeanice Lim, M.D.              DATE OF BIRTH:  1955/09/26   DATE OF ADMISSION:  08/20/2003  DATE OF DISCHARGE:  08/30/2003                                 DISCHARGE SUMMARY   IDENTIFYING DATA:  This is a 55 year old single African-American male  voluntarily admitted with a history of depression and psychosis.  Angry that  work was not being sympathetic to his depression.  Reported decreased sleep,  appetite.  Denied homicidal ideation.  Was having suicidal ideation with  plan to overdose.  Lost 10-15 pounds recently.  Had been noncompliant with  medications and isolating at home.   MEDICATIONS:  The patient had been on Prozac 60 mg, Seroquel 200 mg q.h.s.  The patient apparently never took the Seroquel and there is some question  about the Prozac compliance.   ALLERGIES:  No known drug allergies.   PHYSICAL EXAMINATION:  Within normal limits.  Neurologically nonfocal.   LABORATORY DATA:  Routine admission labs essentially within normal limits.   MENTAL STATUS EXAM:  Alert, sleepy, trying to be cooperate, at this time few  words, possible sedation.  Cooperative but sleepy.  Thought processes goal  directed.  Did not appear to be internally distracted and denied acute  suicidal or homicidal ideation but admitted to fleeting suicidal thoughts  and irritability.  Cognition was intact.  Judgment and insight fair to poor.   ADMISSION DIAGNOSES:   AXIS I:  Major depressive disorder, recurrent, severe.   AXIS II:  Deferred.   AXIS III:  Knee problems.   AXIS IV:  Moderate (problems with occupation and other psychosocial issues).   AXIS V:  25/55-60.   HOSPITAL COURSE:  The patient was admitted and ordered routine p.r.n.  medications and underwent further  monitoring.  Was encouraged to participate  in individual, group and milieu therapy.  The patient was placed on safety  checks and issues of compliance were discussed with patient.  Educated the  patient regarding the importance of being compliant with medications and  follow-up, including impact on insurance covering for future treatment  needs.  The patient was stabilized on medication and Seroquel was optimized  to restore sleep along with trazodone.  Trileptal to stabilize mood.  The  patient reported a significant improvement with mood stability.  He was less  depressed.  Affect brighter.  No irritability or anger and no dangerous  ideation or psychotic symptoms.   CONDITION ON DISCHARGE:  Markedly improved.   DISCHARGE MEDICATIONS:  After medication education, discharged on:   1. Vicodin 7.5\200 mg q.4h. p.r.n. pain, up to a maximum of 4 per day.  2. Effexor XR 75 mg q.a.m.  3. Trileptal 300 mg,  1/2 q.a.m., 1/2 at  3 p.m. and 1 q.h.s.  4. Risperdal M-Tab 0.25 mg q.h.s.  5. Seroquel 300 mg q.h.s.  6. Seroquel 25 mg, 1-2 every eight hours only as needed for agitation.  7. Trazodone 100 mg q.h.s. p.r.n. insomnia.   FOLLOW UP:  The patient was to avoid alcohol, to take medications as  prescribed and keep all follow-up appointments, emphasizing the importance  of compliance.  The patient was to start mental health intensive outpatient  on Monday, September 02, 2003 at 9 a.m.   DISCHARGE DIAGNOSES:   AXIS I:  Major depressive disorder, recurrent, severe.   AXIS II:  Deferred.   AXIS III:  Knee problems.   AXIS IV:  Moderate (problems with occupation and other psychosocial issues).   AXIS V:  Global Assessment of Functioning on discharge 50-55.                                               Jeanice Lim, M.D.    Lovie Macadamia  D:  10/07/2003  T:  10/07/2003  Job:  [id number]

## 2011-01-01 NOTE — H&P (Signed)
Todd [name], Todd [name]              ACCOUNT NO.:  [id number]   MEDICAL RECORD NO.:  [id number]          PATIENT TYPE:  IPS   LOCATION:  0500                          FACILITY:  [facility]   PHYSICIAN:  Geoffery Lyons, M.D.      DATE OF BIRTH:  Apr 26, 1956   DATE OF ADMISSION:  07/09/2006  DATE OF DISCHARGE:                         PSYCHIATRIC ADMISSION ASSESSMENT   This is a voluntary admission to the services of Dr. Geoffery Lyons.   This is a 55 year old, single, African-American male.  He presented at the  window yesterday at approximately 3:15 in the afternoon for admission.  He  reported that he was angry and scared.  He was afraid he will hurt  himself.  He had been using excessive Seroquel but was unclear about  suicidal intent.  He has had no contact with his children or anyone else  over the holiday.  He stayed at the homeless shelter last month because he  did not want to be alone.  He had been having thoughts of drinking heavily  and isolating.  He reported that sometimes I want to live, sometimes I  don't.  He was last with [place] February 18 to October 13, 2005, a very  similar scenario was going on at that point in time, and he has been with [place]  several other times again for depression and alcohol, possible polysubstance  abuse.   SOCIAL HISTORY:  Please see his prior admissions.  The patient was quite  irritable and refused to participate in the admission process.   ALCOHOL AND DRUG HISTORY:  He began using alcohol age 56, he smokes a half  pack of cigarettes per day.   PRIMARY CARE PHYSICIAN:  Olene Craven, MD.   MEDICAL PROBLEMS:  He is known to have arthritis and non-insulin-dependent  diabetes mellitus.  He is unsure as to his current medications.  His blood  sugar on admission was low at 112, and we did not restart any diabetic  medications.  We are checking his CBGs, however.  He had knee surgery on his  right knee five years ago, and on his left fifth toe he had foot  surgery  three months ago.   DRUG ALLERGIES:  He is not known to have any drug allergies.   PHYSICAL EXAMINATION:  VITAL SIGNS ON ADMISSION:  He is 6 foot 4, his weight  is 242, temperature is 97.5, blood pressure was 149/100 to 158/102, pulse  was 74 to 76, respirations are 18.  GENERAL:  He is requesting Vicodin for pain.   MENTAL STATUS EXAM:  On admission, he was noted to be alert and oriented x3.  He was casually dressed.  His motor behavior was somewhat restless on an  intermittent basis.  His speech was normal.  His mood was depressed and  anxious.  His affect was somewhat constricted.  His concentration was  decreased.  His memory was intact.  His insight was fair.  His impulse  control was poor.  Intelligence is at least average.  He was reporting  suicidal ideation, no homicidal ideation, and he was not psychotic,  and no  AVH.   DIAGNOSES:   AXIS I:  1. Major depressive disorder, recurrent, severe, without psychosis.  2. Alcohol abuse.   AXIS II:  Deferred.   AXIS III:  1. Arthritis.  2. Non-insulin-dependent diabetes mellitus.   AXIS IV:  Primary support and general medical.   AXIS V:  30.   PLAN:  Admit to support through alcohol withdrawal.  Toward that end, low  dose Librium protocol was initiated.  His other medications will be adjusted  and restarted as indicated.  He states that he had been taking Cymbalta  samples, Metformin, and Glucophage.  We will have to have the social worker  work with him regarding post discharge placement.  It is unclear whether he  in fact has a place to go.      Mickie Leonarda Salon, P.A.-C.    ______________________________  Geoffery Lyons, M.D.    MD/MEDQ  D:  07/10/2006  T:  07/10/2006  Job:  [id number]

## 2011-01-01 NOTE — Discharge Summary (Signed)
[facility]  Patient:    Todd [name], Todd [name] Visit Number: [id number] MRN: [id number]          Service Type: SUR Location: 5000 [id number] [id number] Attending Physician:  Burnard Bunting Dictated by:   Jeanice Lim, M.D. Admit Date:  08/22/2001 Disc. Date: 07/07/01                             Discharge Summary  IDENTIFYING DATA:  This is a 55 year old African American male, single, voluntarily admitted reporting gradual onset of depression and increased anger and irritability towards his supervisors, describing paranoid ideation and admitting homicidal thoughts at times towards people he works for in general. No prior psychiatric history.  No previous history of violence of suicide attempts.  MEDICATIONS: 1. Celebrex. 2. OxyContin. 3. Vicodin. 4. The patient has been taking Zoloft 10 mg for the last three weeks.  ALLERGIES:  No known drug allergies.  PHYSICAL EXAMINATION:  Essentially within normal limits, unremarkable. NEUROLOGIC:  Nonfocal.  ADMISSION LABORATORIES:  Essentially within normal limits including CBC with differential, comprehensive metabolic panel, except for a glucose elevated at 131.  Thyroid profile and urine drug screen were negative.  Urinalysis negative.  MENTAL STATUS EXAMINATION:  The patient was a fully alert, calm male appearing stated age, polite and pleasant.  Affect tearful at times.  Mood tearful and depressed at times.  Affect constricted.  Speech within normal limits. Thought process goal directed.  Thought content positive for suicidal ideation, positive for homicidal ideation without intent, and symptoms of paranoia and vague suspiciousness.  ADMITTING DIAGNOSES: Axis I:    Major depression, single episode with possible psychotic            features. Axis II:   None. Axis III:  Pharyngitis.  Mild upper respiratory infection.  Gastroesophageal            reflux disease.  Chronic knee pain. Axis IV:   Moderate,  problems with primary support and occupation. Axis V:    32/68.  HOSPITAL COURSE:  The patient was admitted and started on routine p.r.n. medications.  Zoloft was discontinued and he was changed to Prozac targeting depressive symptoms and trazodone to restore sleep.  Zyprexa, low dose, was started for vague suspiciousness and serious depressive symptoms.  Augmentin for upper respiratory infection.  A family meeting was held and the patient reported no side effects to medication changes and a positive response.  CONDITION ON DISCHARGE:  Markedly improved.  Mood was more euthymic.  Affect full.  Thought process goal directed.  Thought content negative for dangerous ideation.  He reported motivation to be compliant with followup plan.  DISCHARGE MEDICATIONS:  He was discharged to take: 1. Prozac 20 mg q.a.m. 2. Zyprexa 5 mg q.h.s. 3. Trazodone 100 mg 1-1/2 q.h.s. 4. Protonix 40 mg q.a.m. 5. Celebrex 100 mg b.i.d.  The patient may return to work on November 25, as tolerated, pending compliance with outpatient monitoring and treatment plan.  The patient is to follow up with [facility] and had an appointment on Tuesday, January 7, at 2:45 in the afternoon and with Eyvonne Mechanic, Tuesday, December 10, at 11:30.  DISCHARGE DIAGNOSES: Axis I:    Major depression, single episode with possible psychotic            features. Axis II:   None. Axis III:  Pharyngitis.  Mild upper respiratory infection.  Gastroesophageal  reflux disease.  Chronic knee pain. Axis IV:   Moderate, problems with primary support and occupation. Axis V:    Global Assessment of Functioning on discharge was 60. Dictated by:   Jeanice Lim, M.D. Attending Physician:  Burnard Bunting DD:  08/23/01 TD:  08/23/01 Job: [id number]

## 2011-01-01 NOTE — Discharge Summary (Signed)
[name], Todd [name]              ACCOUNT NO.:  [id number]   MEDICAL RECORD NO.:  [id number]          PATIENT TYPE:  IPS   LOCATION:  0508                          FACILITY:  [facility]   PHYSICIAN:  Geoffery Lyons, M.D.      DATE OF BIRTH:  1955-12-20   DATE OF ADMISSION:  01/03/2009  DATE OF DISCHARGE:  01/17/2009                               DISCHARGE SUMMARY   CHIEF COMPLAINT/HISTORY OF PRESENT ILLNESS:  This was one of several  admissions to [facility] for this 55 year old male  reported history of depression, feeling very tired, not motivated to do  anything, isolated and not caring for himself.  Report problem with  social environment.  Also admitted to the using alcohol and cocaine as a  way to cope with his depression.   PAST PSYCHIATRIC HISTORY:  Multiple admissions.  Currently supposed to  see Dr. Evelene Croon for outpatient services.   ALCOHOL AND DRUG HISTORY:  Persistent use of alcohol and cocaine.   MEDICAL HISTORY:  Knee pain, diabetes mellitus.   MEDICATIONS:  1. Effexor XR 150 mg twice a day  2. Metformin 500 mg twice a day.  3. Vistaril 25 mg as needed.  4. Mobic 50 mg per day.  5. Ultram 50 mg.  6. Trazodone 50 at night.   PHYSICAL EXAMINATION:  Failed to show any acute findings.   LABORATORY WORKUP:  Blood sugar fluctuated from 99 to 161.  UDS positive  for cocaine.  CBC within normal limits.   MENTAL STATUS EXAMINATION:  Reveals alert cooperative male.  Mood  depressed.  Affect depressed, somewhat irritable.  Thought processes  were logical, coherent and relevant.  No active suicidal ideas, no  delusions.  No hallucinations.  Cognition well preserved.   ADMITTING DIAGNOSES:  AXIS I:  Major depression recurrent.  Cocaine and  alcohol abuse.  AXIS II:  No diagnosis.  AXIS III:  Non-insulin-dependent diabetes mellitus, knee pain.  AXIS IV:  Moderate.  AXIS V:  On admission 35, highest GAF in the last year 60.   COURSE IN THE HOSPITAL:  He was  admitted, started individual and group  psychotherapy.  Initially we detoxified with Librium.  Was maintained on  the Effexor, Vistaril and the Ultram.  As already stated admitted with  increased depression.  Has been off his Effexor.  Feeling depressed and  dejected somewhat withdrawn, isolating, endorsed that he was sleeping a  lot, not motivated.  Not sure what was going to happen once he was  discharged.  Endorsed he was overwhelmed and he would not be able to  function.  Claimed pain, needing a lot of reassurance.  Become teary-  eyed when talking about not trusting anyone.  May 26 he endorsed that he  had the need to be liked, feeling that he liked the one person, but the  one person would not like him back.  Needing a lot of reassurance that  his liked.  Endorsed he hates himself for this.  Endorsed anxiety,  worried.  Endorsing passive suicidal ideations.  No plan or intent.  May  27,  very labile, ruminating about wanting to be liked by others,  somewhat regressing his behavior and his presentation.  Wanting a hug.  We worked on Pharmacologist.  He continued to be quite labile in his  behavior, taking offense, feeling rejected by the other people not  showing affection.  You really do not like me.  We addressed the  distortions.  We worked cognitive behavioral therapy, focus on mostly  being worried, what was going to happen once he was out of the hospital.  Further regressed, worried about notification that were sent to him  about his disability.  Worried about going back to that place where he  came from.  Concerned that he might use again.  He continued to endorse  depression and we worked with the Effexor optimized the dose.  We worked  with his pain management.  We worked with cognitive behavioral therapy  reassuring him.  We sent the proper documentation to the disability  office.  June 4 he was in full contact reality.  Endorsed that his  landlord had said that he was going  to find another place to live.  Endorsed that he had to change people and places.  Objectively much  better, more involved with other patients.  No active suicidal or  homicidal ideas.  We went ahead and discharged to outpatient followup.   DISCHARGE DIAGNOSES:  AXIS I:  Major depressive disorder, alcohol abuse,  cocaine abuse.  AXIS II:  No diagnosis.  AXIS III:  Diabetes mellitus, non-insulin-dependent.  Knee pain.  AXIS IV:  Moderate.  AXIS V:  On discharge 50-55.   DISCHARGE MEDICATIONS:  1. Effexor XR 150 mg per day.  2. Glucophage 500 mg twice a day.  3. Ultram 50 mg 1 to 2 four times a day as needed.  4. Trazodone 100 mg 1 to 2 at night.  5. Vistaril 25 mg four times a day.  6. Mobic 50 mg per day.   FOLLOW UP:  Dr. Evelene Croon and the [facility] program.      Geoffery Lyons, M.D.  Electronically Signed     IL/MEDQ  D:  02/19/2009  T:  02/19/2009  Job:  [id number]

## 2011-01-01 NOTE — H&P (Signed)
Todd [name], Todd [name]                          ACCOUNT NO.:  [id number]   MEDICAL RECORD NO.:  [id number]                   PATIENT TYPE:  IPS   LOCATION:  0501                                 FACILITY:  [facility]   PHYSICIAN:  Jeanice Lim, M.D.              DATE OF BIRTH:  1955-11-12   DATE OF ADMISSION:  10/03/2002  DATE OF DISCHARGE:  10/12/2002                         PSYCHIATRIC ADMISSION ASSESSMENT   IDENTIFYING INFORMATION:  This is a 55 year old African-American male who is  single, voluntary admission.  His chief complaint:  I felt like I wanted to  snatch her face off, in referring to his common law wife.   HISTORY OF PRESENT ILLNESS:  This patient was referred by an outpatient  psychiatrist for homicidal ideation toward his ex-wife after she took out a  restraining order against him and he is unable to see his children.  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  He has had thoughts of beating up his wife and felt  like he wanted to snatch her face off yesterday when she was talking to  him on the phone.  He has been drinking approximately every other day,  drinking 2-3 40 ounce beers and drinking has accelerated some since his wife  put him out of the house several months ago.  Today, he denies any auditory  or visual hallucinations, he denies any symptoms of alcohol withdrawal at  this time.   PAST PSYCHIATRIC HISTORY:  The patient has been followed by Dr. Hipolito Bayley in the [facility] outpatient clinic.  This is his second admission  to [facility], with his last admission being  approximately 2 weeks prior to this one.  He has a history of anger  outbursts and some history of alcohol abuse.   SOCIAL HISTORY:  The patient  is employed by the [employer].  He was  separated approximately 8 months from his common-law wife of several years.  He has 2 children, ages 52 and 2.  He reports chronic conflict with his ex-  wife over child support and visitation issues.   FAMILY HISTORY:  Unclear.   ALCOHOL AND DRUG HISTORY:  The patient endorses abuse of alcohol but denies  any other substance abuse.   PAST MEDICAL HISTORY:  The patient is followed by Dr. Dorene Grebe, M.D., who  is his orthopedist, for some chronic knee pain, and he has had knee surgery  most recently in the past October of 2003.  He is also followed by Dr.  Tommie Ard, M.D., who is his primary care physician.   MEDICATIONS:  Prozac 60 mg, this was increased to this dosage approximately  3 weeks ago.  Vicodin as needed for knee pain, and Seroquel 100 mg at  bedtime, started last week.   DRUG ALLERGIES:  None.   REVIEW OF SYSTEMS:  Remarkable for knee pain which tends to be chronic in  nature, which the patient currently scores a 4/10.  He uses Vicodin once or  twice a day to help control the knee pain.  The patient denies any symptoms  of withdrawal.  He does not appear diaphoretic or shaky at this time.  His  preventive care is up to date, and he does complain of some clear rhinorrhea  and some sniffles, with cough and cold, over the last 1-2 weeks.   PHYSICAL EXAMINATION:  This is a well-nourished, well-developed African-  American male who is obese.  He is approximately 5 feet 9 inches tall,  weighs 254 pounds.  Vital signs on admission to the unit:  Temperature 99.7,  pulse 92, respirations 24, blood pressure 180/92.  He appears to be his  stated age of 55 years old.  He has an irritable affect but is otherwise  cooperative.  HEAD:  Normocephalic and atraumatic.  Hair is cut short.  Hygiene  satisfactory.  Skin is medium tone, no remarkable features.  EENT:  PERRLA.  Sclerae are nonicteric.  Hearing intact to normal voice.  There is  some slight amount of clear rhinorrhea, no swelling of nares.  Nasal septum is intact.  Tongue is midline without fasciculations.  Trachea  is midline.  No thyromegaly, no lymphadenopathy.  CARDIOVASCULAR:  S1 and S2 are heard.  No clicks, murmurs or gallops.  Apical pulse is synchronous with radial pulse.  Lungs are clear to  auscultation.  ABDOMEN:  Rounded, soft, nontender.  No masses appreciated.  GENITALIA:  Deferred.  MUSCULOSKELETAL:  Spine is straight, posture upright, no swelling or  erythema noted of any joints.  NEURO:  Cranial nerves II-XII intact.  EOMs intact.  Romberg without  findings.  Deep tendon reflexes are trace but bilaterally symmetrical,  difficult to elicit.  Motor movements are smooth, no tremor.  Sensory is  grossly intact.  Cerebellar function is intact for rapid alternating  movements.  No EPS is noted.   LABORATORY DATA:  The patient's laboratory studies reveal that his glucose  was mildly elevated at admission.  His SGOT was mildly elevated at 64, with  an SGPT of 97.  An alcohol level was not done on the patient at the time of  admission so we will not attempt to get one now at this time.   MENTAL STATUS EXAM:  This is an obese male who is large built, fully alert.  He has some mild psychomotor agitation as he begins to get involved with  talking about his wife and their relationship.  His affect is quite  irritable, but generally he is very directable and cooperative.  Speech is  mildly pressured when he gets into discussing his problems with his wife.  Mood is angry, specifically targeted at the wife.  He is irritable, feels  somewhat depressed and hopeless about their situation and feels unable to  resolve it.  Thought process possibly reveals some vague paranoia.  When he  talks about her he seems to be convinced that no matter what happens she will not cooperate with him.  He does have some thought agitation, vague  suicidal ideation, definitely  homicidal ideation towards his wife.  No  evidence of auditory or visual hallucinations.  Cognitively intact and  oriented x3.  ADMISSION DIAGNOSIS:   AXIS I:  1. Major depressive disorder, recurrent, severe, with psychotic features.  2. Alcohol abuse rule out dependence.   AXIS II:  Deferred.   AXIS III:  Elevated liver enzymes, chronic right knee pain, viral upper  respiratory infection.   AXIS IV:  Moderate to severe, conflict with his ex-wife.   AXIS V:  Current 28, past year 76.   INITIAL PLAN OF CARE:  Voluntarily admit the patient to alleviate his  homicidal ideation and his agitation.  We are going to decrease his Prozac  to 40 mg daily at this point and go ahead and continue him on Seroquel 100  mg q.h.s. and also give him Seroquel 25 mg p.o. q.6 hours p.r.n. for  agitation, and he is not having any alcohol withdrawal symptoms at this  time, but we will give him Librium 25 mg p.o. q.6 hours p.r.n. for  withdrawal.  Meanwhile, we will go ahead and continue his Vicodin ES 1  tablet q.4-6 hours, that is 7.5/750 mg, as needed for  knee pain.  We will give him some Afrin nasal spray and Claritin 10 mg daily  for his viral upper respiratory infection and we will check a fasting CBG  since his blood glucose was elevated at the time of admission.   ESTIMATED LENGTH OF STAY:  7 days.      Margaret A. Stephannie Peters                   Jeanice Lim, M.D.    MAS/MEDQ  D:  10/22/2002  T:  10/22/2002  Job:  [id number]

## 2011-01-01 NOTE — Discharge Summary (Signed)
Todd [name], Todd [name]              ACCOUNT NO.:  [id number]   MEDICAL RECORD NO.:  [id number]          PATIENT TYPE:  IPS   LOCATION:  0500                          FACILITY:  [facility]   PHYSICIAN:  Geoffery Lyons, M.D.      DATE OF BIRTH:  12/10/1955   DATE OF ADMISSION:  07/09/2006  DATE OF DISCHARGE:  07/16/2006                               DISCHARGE SUMMARY   ADMITTING DIAGNOSES:   AXIS I:  1. Major depression, recurrent.  2. Alcohol dependence.   AXIS II:  No diagnosis.   AXIS III:  1. Arthritis.  2. Non-insulin-dependent diabetes mellitus.  3. Status post surgery on his toe with persistent pain.   AXIS IV:  Moderate.   AXIS V:  Upon admission 30; his GAF in the last year is 65.   DISCHARGE DIAGNOSES:   AXIS I:  1. Major depression, recurrent.  2. Alcohol dependence.   AXIS II:  No diagnosis.   AXIS III:  1. Arthritis.  2. Non-insulin-dependent diabetes mellitus.  3. Status post knee replacement.  4. Status post surgery on his toes.   AXIS IV:  Moderate.   AXIS V:  Upon discharge 50.   CHIEF COMPLAINT AND PRESENT ILLNESS:  It was one of several admissions  to [facility] for this 55 year old, single, African-  American male.  He came to [facility] stating that he  was angry and scared.  He was afraid he would hurt himself, had been on  excessive Seroquel but was unclear about suicidal intent.  No contact  with his children or anyone else over the holiday.  Stayed at the  homeless shelter the month before, did not want to be alone, had been  having thoughts of drinking heavily and isolating.  Claimed that  sometimes I want to live, sometimes I don't.   PAST PSYCHIATRIC HISTORY:  Multiple admissions to [facility]; last admission February 18 to October 13, 2005, admitted with  depression and alcohol dependence.   MEDICAL HISTORY:  1. Arthritis.  2. Non-insulin-dependent diabetes mellitus.  3. Status post  right knee surgery.  4. Left fifth toe surgery.   Physical exam performed failed to show any acute findings.   LABORATORY WORKUP:  CBC:  White blood cells 5.0, hemoglobin 14.8.  Sodium 139, potassium 4.2, glucose 147, creatinine 1.1, BUN 21.  Liver  enzymes:  SGOT 47, SGPT 64, total bilirubin 0.7.  Hemoglobin A1c 6.3.  TSH 0.978.   MENTAL STATUS EXAM:  An alert, cooperative male casually dressed,  restless.  Mood was of anxiety.  Affect was labile, easily irritated,  building up to being agitated.  Endorsed that he was feeling pretty  upset, having a hard time seeing himself getting out of the situation.  Endorsed suicidal ruminations, no active plan, no evidence of homicidal  ideas, no hallucinations.  Cognition well preserved.   COURSE IN THE HOSPITAL:  He was admitted.  He was started in the  individual and group psychotherapy.  Later on, his medications were  ratified to be Trazodone 50 mg at bedtime, Metformin  500 twice a day,  and he was taken Vicodin 7.5/500 every six hours as needed.  There was  no clarity in terms of his compliance, and he was not a good historian  when it came to that.  He was detoxified with Librium, he was given  Seroquel as needed, worked with the Vicodin and the Trazodone, he was  given Ambien for sleep.  He was placed back on the Effexor he claimed  was effective for him.  He did endorse he was having a hard time.  His  ex-wife took the kids away from him.  Apparently after he was here the  last time in February, he went into a half-way house, got into an  argument, he was kicked out, went to a shelter, got very upset, claims  pain, did not have medications, claimed that he drank to take care of  the pain.  He felt he was barreling down, very upset, losing more and  more control, wanted someone to help him.  Endorsed irritability, mood  swings, fear of losing control, pain, endorsed he wanted to go back on  the Effexor, the Trazodone, the Ambien, and  the Seroquel.  He was  persistently endorsing that he was having a hard time with the reason  that was going on, he was focused on whether he was going to go from  being in the unit, would like to go to rehab as he says he was not going  to be able to make it otherwise.  Endorsed anxiety, worries, ruminating,  keeps going back and forth, the stress of the conflicting relationship  with the girlfriend.  He endorsed that he needed to be inpatient, he  claimed that he could hurt himself if he was out of the unit.  November  28th, he was endorsing he did not have any motivation, no reason why to  go on, his mood was really down, feeling very, very depressed, very,  very upset because of the situation, wanting to go to a residential  treatment program.  We started working on the possibility of his going  to a residential program.  He was accepted to go to the [facility].  He was started on oral hypoglycemics.  We continued to work  with the medication.  We adjusted the dosages.  We worked on relapse  prevention.  He was upset because he was not recovering as well as he  did before, also endorsed that he was being more honest with his  feelings.  He claimed for the first time he had realized and admitted to  himself he was not going to be able to go back with the mother of his  children.  Endorsed that he was dealing with the loss.  After going to  rehab, he would like to come back to [place].  Arrangements were  made, and he was discharged to go to [facility], but he was going to continue to work with his alcohol  dependency and then would have a chance to also help to stabilize his  mood.  Upon discharge, in full contact with reality, no suicidal or  homicidal ideations, more hopeful knowing that he was going to a rehab  unit.   DISCHARGE MEDICATIONS:  1. Effexor-XR 75 mg per day.  2. Trazodone 100 mg at night. 3. Glucophage 500 mg daily in the  morning.   Followup through [facility].      Geoffery Lyons,  M.D.  Electronically Signed     IL/MEDQ  D:  08/04/2006  T:  08/04/2006  Job:  [id number]

## 2011-01-01 NOTE — Op Note (Signed)
[facility]  Patient:    Todd [name], Todd [name] Visit Number: [id number] MRN: [id number]          Service Type: SUR Location: [id number] Attending Physician:  Burnard Bunting Dictated by:   Cammy Copa, M.D. Proc. Date: 08/22/01 Admit Date:  08/22/2001                             Operative Report  PREOPERATIVE DIAGNOSIS:  Chronic right quadriceps rupture.  POSTOPERATIVE DIAGNOSIS:  Chronic right quadriceps rupture.  PROCEDURE:  Right Codvilla V-Y quadricepsplasty with Scuderi modification for chronic quadriceps tendon rupture repair.  SURGEON:  Cammy Copa, M.D.  ANESTHESIA:  Epidural.  ESTIMATED BLOOD LOSS:  150.  DRAINS:  None.  INDICATION:  Todd [name] is a 55 year old patient with a six-month chronic quad rupture.  He presents now for operative management.  DESCRIPTION OF PROCEDURE:  The patient was brought to the operating room, where epidural anesthesia was induced.  Preoperative antibiotics were given. Right leg was prepped and noted to have full range of motion with stable ligaments.  Palpable defect above the patella was noted.  Operative field was covered with Ioban.  Tourniquet was not utilized.  Skin incision beginning at the inferior pole of the patella to location two handbreadths above the superior aspect of the patella was made.  Skin and subcutaneous tissue were sharply divided.  Pseudocapsule overlying the proximal patellar defect was incised.  The tendon edge had recessed proximally and was fibrosed over and was in the shape of a crescent.  The measured defect was about 4.5-5 cm.  The maximum defect space was laterally, less of the defect space was noted medially.  The quadriceps was mobilized laterally, medially, and inferiorly and superiorly.  The native joint capsule was identified and also mobilized. The tendon edge was freshened.  The proximal aspect of the patella was also prepared.  After extensive  and meticulous mobilization, a traction suture was placed into the quadriceps tendon, and it could be mobilized to a point less than 1 cm away on the medial side and about 1.5 cm away on the lateral side of the proximal aspect of the patella.  The patella itself was also mobilized. The lateral patellofemoral ligament was incised.  A bony bed and shallow trough was then prepared on the anterior superior aspect of the patella.  With traction and position, careful measurements on the quadriceps tendon were made.  A Codvilla V-Y quadricepsplasty was then performed by cutting an inverted V along the medial and lateral fibrous edge of the quadriceps tendon down a point about 1.3 cm away from the fibrous edge of the retracted tendon distally.  Two-thirds of the flap thickness was then turned distally.  The remaining defect was closed with #1 Ethibond suture.  The medial and lateral aspect of the capsule were then tagged with modified Mason-Allen sutures of #1 Ethibond.  Three sutures were placed in the proximal and distal edges of the lateral capsule.  One suture was placed in the proximal and distal edge of the medial capsule.  Medial and lateral capsule was then repaired up to the edge of the patella.  This was facilitated by traction on the quadriceps tendon. #2 Fibrewire suture was then placed along the medial and lateral aspect of the proximal quadriceps tendon.  One #2 Fibrewire was placed in Krackow fashion in the inverted flap of the quadriceps tendon.  Drill holes  with small drill bit were then made in the patella.  The two outside #2 Fibrewire sutures were placed in the proximal medial and proximal lateral aspect of the patella.  The flap was then turned down and fit nicely in the trough and was secured with one Fibrewire suture, both ends of which travelled through separate drill holes.  The Fibrewire sutures were tied, the knee was flexed to 30 degrees and noted to maintain excellent  apposition of the tendon within the bony trough. Superficial retinacular tissue was then closed over the tendon to give watertight closure in the capsule.  Thorough irrigation with 2 L of irrigating solution was performed prior to closure.  The articular surfaces of the patella and proximal femur were intact.  The skin and subcutaneous tissue were then closed in layers using interrupted 0 Vicryl, 2-0 Vicryl, and skin staples.  The patient had a palpable pedal pulse and soft thigh compartments at the conclusion of the case.  The patient was then placed in a posterior splint and a knee immobilizer and soft dressing.  He tolerated the procedure well without immediate complications. Dictated by:   Cammy Copa, M.D. Attending Physician:  Burnard Bunting DD:  08/23/01 TD:  08/24/01 Job: [id number]

## 2011-01-01 NOTE — Discharge Summary (Signed)
House. [facility]  Patient:    Todd [name], Todd [name] Visit Number: [id number] MRN: [id number]          Service Type: SUR Location: 5000 5018 01 Attending Physician:  Burnard Bunting Dictated by:   Cammy Copa, M.D. Admit Date:  08/22/2001 Discharge Date: 08/25/2001                             Discharge Summary  DISCHARGE DIAGNOSIS:  Chronic right quadriceps rupture.  SECONDARY DIAGNOSIS:  None.  OPERATIONS AND PROCEDURES:  Right chronic quadriceps rupture repair using ______ quadriplasty with ______ modification.  HISTORY OF PRESENT ILLNESS:  The patient is a 55 year old patient with about a five month history of right chronic quadriceps rupture.  This occurred in July.  He presents now for operative management.  HOSPITAL COURSE:  The patient was admitted to the orthopedic service on 08/22/01. At that time, he underwent right chronic quad rupture repair.  The patient was managed postoperatively with an epidural and a PCA pain pump.  The patient tolerated the procedure well, and was maintained in a knee immobilizer following surgery.  Incision was intact at the time of discharge.  Drain was removed on postoperative day #1.  He was discharged on 08/25/01, in good condition.  FOLLOWUP:  With me in one week.  DISCHARGE MEDICATIONS: 1. Preoperative medications. 2. Percocet one or two p.o. q.3-4h. p.r.n. pain. 3. Aspirin q.d. Dictated by:   Cammy Copa, M.D. Attending Physician:  Burnard Bunting DD:  09/27/01 TD:  09/27/01 Job: [id number]

## 2011-01-01 NOTE — Discharge Summary (Signed)
Todd [name], Todd [name]              ACCOUNT NO.:  [id number]   MEDICAL RECORD NO.:  [id number]         PATIENT TYPE:  BIPS   LOCATION:  501                           FACILITY:  [facility]   PHYSICIAN:  Jeanice Lim, M.D. DATE OF BIRTH:  [birth year]   DATE OF ADMISSION:  04/14/2005  DATE OF DISCHARGE:  04/23/2005                                 DISCHARGE SUMMARY   IDENTIFYING DATA:  This is a 55 year old African-American male, voluntarily  admitted, presenting with history of depression that had been increasing for  the past 2 weeks, endorsing suicidal thoughts with thoughts to walk into  traffic, and homicidal thoughts towards no specific person.  Recently  diagnosed with diabetes.  The patient had been drinking and using cocaine as  well as benzodiazepines, was feeling on the edge and concerned about his  safety and others' safety.  First admission to [facility].  Outpatient with Dr.  Lolly [name].  Dr. Burnett [name] is his primary care physician.   ADMISSION MEDICATIONS:  Glipizide, metformin.  The patient had not been  taking metformin and was controlling diabetes with diet.   ALLERGIES:  No known drug allergies.   PHYSICAL AND NEUROLOGICAL EXAMINATION:  Within normal limits.   ROUTINE ADMISSION LABS:  Urine drug screen positive for cocaine and  benzodiazepines.  SGOT elevated at 78 and SGPT at 71.  Total bilirubin was  1.2.  Alcohol level less than 5.   MENTAL STATUS EXAM:  Mood and affect were difficult to ascertain due to the  patient's apparent sedation.  There was no clear endorsement of suicidal  thoughts and no apparent psychosis.  Questionable paranoid ideation and  previous report of auditory hallucinations and suicidal thoughts with plan  prior to admission.  The patient unreliable and poor historian.  Judgment  and insight were poor.   ADMISSION DIAGNOSES:  AXIS I:  Major depressive disorder, rule out a  schizoaffective disorder versus a substance-induced psychotic or mood  disorder,  and cocaine dependence, alcohol dependence.  AXIS II:  Deferred.  AXIS III:  Non-insulin-dependent diabetes mellitus, knee pain.  AXIS IV:  Moderate, problems with occupation and medical problems.  AXIS V:  30/60.   HOSPITAL COURSE:  The patient was admitted and ordered routine p.r.n.  medications.  Blood sugars were monitored, primary care Todd [name] contacted.  The patient admitted to history of noncompliance with medications and  reported motivation to become alcohol and drug free. The patient was  monitored for withdrawal, placed on detox regimen, stabilized, and reported  a positive response to clinical intervention.  Initial suicidal ideation,  reactive, tearful, reported he had been very depressed for 6 weeks and  unable to work since August 24 and was admitted on August 30.  Reported that  he had been sober and compliant, stopped his antidepressant and became more  depressed, isolated.  Gets upset with mother of children, unable to see  family at times depending on his mood.  Mother of children reported that the  patient had clear mood instability and at times she could tell that he was  using something and did not feel like  he should be around the children.  Family session was held with physician which was very productive and the  patient committed to sobriety as well as compliance with followup.  Mother  of children seemed very supportive and had insight regarding setting healthy  limits.  The patient did have suspiciousness and paranoia as well as  suicidal thoughts on the first few days of admission and the patient did  participate fully in treatment, going to all groups and appeared to benefit  from his inpatient stay and appeared to have more insight than at the last  hospitalization, accepting his illness as well as the importance of  abstinence and becoming more socially connected.  The patient was discharged  in improved condition, with a stable mood, improved judgment and  insight, no  side effects from medications, no acute withdrawal symptoms, reporting  motivation to be compliant with the aftercare plan, including medical and  psychiatric followup and abstinence.  He was given medication education and  discharged on:  1.  Prozac 20 mg q.a.m.  2.  Protonix 40 mg b.i.d.  3.  Trilafon 4 mg at 8 p.m.  4.  Trazodone 50 mg 1 or 2 at 8 p.m. p.r.n. insomnia.  5.  Ambien 10 mg q.h.s. p.r.n. insomnia x2 weeks.  6.  Vicodin 1-2 q.6 max 6 in 24 hour period for severe knee pain, given a 2-      week supply.   DISPOSITION:  Recommend couples therapy as well as family therapy and  individual therapy and psychiatric followup and substance abuse followup.  The patient was set up with Dr. Evelene [name] on September 27 at 12 p.m. and with  [name] on Monday, September 11 at 8:45.   DISCHARGE DIAGNOSES:  AXIS I:  Major depressive disorder, rule out a  schizoaffective disorder versus a substance-induced psychotic or mood  disorder, and cocaine dependence, alcohol dependence.  AXIS II:  Deferred.  AXIS III:  Non-insulin-dependent diabetes mellitus, knee pain.  AXIS IV:  Moderate, problems with occupation and medical problems.  AXIS V:  Global assessment of function on discharge was 55-60.      Jeanice Lim, M.D.  Electronically Signed     JEM/MEDQ  D:  06/06/2005  T:  06/06/2005  Job:  [id number]
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
## 2011-01-14 ENCOUNTER — Ambulatory Visit (HOSPITAL_COMMUNITY): Payer: Medicare Other

## 2011-01-14 ENCOUNTER — Inpatient Hospital Stay (HOSPITAL_COMMUNITY): Admission: RE | Admit: 2011-01-14 | Payer: Federal, State, Local not specified - PPO | Source: Ambulatory Visit

## 2011-01-29 ENCOUNTER — Other Ambulatory Visit: Payer: Self-pay | Admitting: Diagnostic Radiology

## 2011-01-29 ENCOUNTER — Ambulatory Visit (HOSPITAL_COMMUNITY): Payer: Medicare Other

## 2011-01-29 ENCOUNTER — Ambulatory Visit (HOSPITAL_COMMUNITY)
Admission: RE | Admit: 2011-01-29 | Discharge: 2011-01-29 | Disposition: A | Discharge status: Home / Self Care | Payer: Medicare Other | Source: Ambulatory Visit | Attending: Gastroenterology | Admitting: Gastroenterology

## 2011-01-29 ENCOUNTER — Other Ambulatory Visit: Payer: Self-pay | Admitting: Gastroenterology

## 2011-01-29 DIAGNOSIS — B1920 Unspecified viral hepatitis C without hepatic coma: Secondary | ICD-10-CM | POA: Insufficient documentation

## 2011-01-29 DIAGNOSIS — Z79899 Other long term (current) drug therapy: Secondary | ICD-10-CM | POA: Insufficient documentation

## 2011-01-29 DIAGNOSIS — Z7901 Long term (current) use of anticoagulants: Secondary | ICD-10-CM | POA: Insufficient documentation

## 2011-01-29 DIAGNOSIS — E119 Type 2 diabetes mellitus without complications: Secondary | ICD-10-CM | POA: Insufficient documentation

## 2011-01-29 LAB — CBC
MCH: 31.7 pg (ref 26.0–34.0)
MCHC: 34.3 g/dL (ref 30.0–36.0)
MCV: 92.4 fL (ref 78.0–100.0)
Platelets: 193 10*3/uL (ref 150–400)
RDW: 12.7 % (ref 11.5–15.5)

## 2011-01-29 LAB — GLUCOSE, CAPILLARY: Glucose-Capillary: 111 mg/dL — ABNORMAL HIGH (ref 70–99)

## 2011-01-29 LAB — PROTIME-INR: Prothrombin Time: 13.6 seconds (ref 11.6–15.2)

## 2011-04-01 ENCOUNTER — Emergency Department (HOSPITAL_COMMUNITY)
Admission: EM | Admit: 2011-04-01 | Discharge: 2011-04-02 | Disposition: A | Discharge status: Other Acute Inpatient Hospital | Payer: Medicare Other | Source: Home / Self Care | Attending: Emergency Medicine | Admitting: Emergency Medicine

## 2011-04-01 DIAGNOSIS — E119 Type 2 diabetes mellitus without complications: Secondary | ICD-10-CM | POA: Insufficient documentation

## 2011-04-01 DIAGNOSIS — Z7901 Long term (current) use of anticoagulants: Secondary | ICD-10-CM | POA: Insufficient documentation

## 2011-04-01 DIAGNOSIS — Z8546 Personal history of malignant neoplasm of prostate: Secondary | ICD-10-CM | POA: Insufficient documentation

## 2011-04-01 DIAGNOSIS — R45851 Suicidal ideations: Secondary | ICD-10-CM | POA: Insufficient documentation

## 2011-04-01 DIAGNOSIS — F3289 Other specified depressive episodes: Secondary | ICD-10-CM | POA: Insufficient documentation

## 2011-04-01 DIAGNOSIS — F329 Major depressive disorder, single episode, unspecified: Secondary | ICD-10-CM | POA: Insufficient documentation

## 2011-04-01 LAB — COMPREHENSIVE METABOLIC PANEL
ALT: 44 U/L (ref 0–53)
CO2: 26 mEq/L (ref 19–32)
Calcium: 10.3 mg/dL (ref 8.4–10.5)
Chloride: 101 mEq/L (ref 96–112)
Creatinine, Ser: 0.78 mg/dL (ref 0.50–1.35)
GFR calc Af Amer: >60 mL/min (ref >60)
GFR calc non Af Amer: >60 mL/min (ref >60)
Glucose, Bld: 107 mg/dL — ABNORMAL HIGH (ref 70–99)
Sodium: 137 mEq/L (ref 135–145)
Total Bilirubin: 0.5 mg/dL (ref 0.3–1.2)

## 2011-04-01 LAB — CBC
Platelets: 189 10*3/uL (ref 150–400)
RDW: 12.7 % (ref 11.5–15.5)
WBC: 6.7 10*3/uL (ref 4.0–10.5)

## 2011-04-01 LAB — DIFFERENTIAL
Basophils Absolute: 0 10*3/uL (ref 0.0–0.1)
Eosinophils Absolute: 0.1 10*3/uL (ref 0.0–0.7)
Eosinophils Relative: 2 % (ref 0–5)

## 2011-04-01 LAB — PROTIME-INR: Prothrombin Time: 12.9 seconds (ref 11.6–15.2)

## 2011-04-01 LAB — RAPID URINE DRUG SCREEN, HOSP PERFORMED
Benzodiazepines: NOT DETECTED
Cocaine: POSITIVE — AB

## 2011-04-01 LAB — ETHANOL: Alcohol, Ethyl (B): <11 mg/dL (ref 0–11)

## 2011-04-02 ENCOUNTER — Inpatient Hospital Stay (HOSPITAL_COMMUNITY)
Admission: RE | Admit: 2011-04-02 | Discharge: 2011-04-14 | DRG: 885 | Disposition: A | Discharge status: Home / Self Care | Payer: Medicare Other | Attending: Psychiatry | Admitting: Psychiatry

## 2011-04-02 DIAGNOSIS — F1410 Cocaine abuse, uncomplicated: Secondary | ICD-10-CM

## 2011-04-02 DIAGNOSIS — F411 Generalized anxiety disorder: Secondary | ICD-10-CM

## 2011-04-02 DIAGNOSIS — F332 Major depressive disorder, recurrent severe without psychotic features: Principal | ICD-10-CM

## 2011-04-02 DIAGNOSIS — Z8546 Personal history of malignant neoplasm of prostate: Secondary | ICD-10-CM

## 2011-04-02 DIAGNOSIS — Z7901 Long term (current) use of anticoagulants: Secondary | ICD-10-CM

## 2011-04-02 DIAGNOSIS — I10 Essential (primary) hypertension: Secondary | ICD-10-CM

## 2011-04-02 DIAGNOSIS — R45851 Suicidal ideations: Secondary | ICD-10-CM

## 2011-04-02 DIAGNOSIS — Z86718 Personal history of other venous thrombosis and embolism: Secondary | ICD-10-CM

## 2011-04-02 DIAGNOSIS — E119 Type 2 diabetes mellitus without complications: Secondary | ICD-10-CM

## 2011-04-02 DIAGNOSIS — Z79899 Other long term (current) drug therapy: Secondary | ICD-10-CM

## 2011-04-02 DIAGNOSIS — F339 Major depressive disorder, recurrent, unspecified: Secondary | ICD-10-CM

## 2011-04-02 LAB — GLUCOSE, CAPILLARY: Glucose-Capillary: 106 mg/dL — ABNORMAL HIGH (ref 70–99)

## 2011-04-03 LAB — GLUCOSE, CAPILLARY
Glucose-Capillary: 107 mg/dL — ABNORMAL HIGH (ref 70–99)
Glucose-Capillary: 121 mg/dL — ABNORMAL HIGH (ref 70–99)

## 2011-04-04 LAB — GLUCOSE, CAPILLARY
Glucose-Capillary: 139 mg/dL — ABNORMAL HIGH (ref 70–99)
Glucose-Capillary: 98 mg/dL (ref 70–99)

## 2011-04-05 LAB — PROTIME-INR: INR: 2.04 — ABNORMAL HIGH (ref 0.00–1.49)

## 2011-04-06 LAB — GLUCOSE, CAPILLARY
Glucose-Capillary: 128 mg/dL — ABNORMAL HIGH (ref 70–99)
Glucose-Capillary: 123 mg/dL — ABNORMAL HIGH (ref 70–99)
Glucose-Capillary: 104 mg/dL — ABNORMAL HIGH (ref 70–99)

## 2011-04-06 LAB — PROTIME-INR: INR: 2.15 — ABNORMAL HIGH (ref 0.00–1.49)

## 2011-04-07 LAB — GLUCOSE, CAPILLARY: Glucose-Capillary: 104 mg/dL — ABNORMAL HIGH (ref 70–99)

## 2011-04-08 LAB — PROTIME-INR
INR: 2.19 — ABNORMAL HIGH (ref 0.00–1.49)
Prothrombin Time: 24.7 seconds — ABNORMAL HIGH (ref 11.6–15.2)

## 2011-04-08 LAB — GLUCOSE, CAPILLARY: Glucose-Capillary: 128 mg/dL — ABNORMAL HIGH (ref 70–99)

## 2011-04-09 LAB — GLUCOSE, CAPILLARY
Glucose-Capillary: 112 mg/dL — ABNORMAL HIGH (ref 70–99)
Glucose-Capillary: 131 mg/dL — ABNORMAL HIGH (ref 70–99)

## 2011-04-10 LAB — PROTIME-INR
INR: 2.31 — ABNORMAL HIGH (ref 0.00–1.49)
Prothrombin Time: 25.8 seconds — ABNORMAL HIGH (ref 11.6–15.2)

## 2011-04-11 LAB — GLUCOSE, CAPILLARY

## 2011-04-12 LAB — PROTIME-INR
INR: 2.49 — ABNORMAL HIGH (ref 0.00–1.49)
Prothrombin Time: 27.3 seconds — ABNORMAL HIGH (ref 11.6–15.2)

## 2011-04-12 LAB — GLUCOSE, CAPILLARY: Glucose-Capillary: 107 mg/dL — ABNORMAL HIGH (ref 70–99)

## 2011-04-12 NOTE — Assessment & Plan Note (Signed)
[name], [name] NO.:  [id number]  MEDICAL RECORD NO.:  [id number]  LOCATION:  WLED                         FACILITY:  [facility]  PHYSICIAN:  Todd Gallo, MD     DATE OF BIRTH:  30-Jun-1956  DATE OF ADMISSION:  04/01/2011 DATE OF DISCHARGE:                      PSYCHIATRIC ADMISSION ASSESSMENT   IDENTIFYING INFORMATION:  This is a 55 year old male who presented to the emergency department reporting suicidal thinking stating that "he was just about ready to take the pills and go to sleep."  He is having symptoms of worthlessness and hopelessness.  Worried about his sex life after his prostate surgery and that this bothers him as a man.  He states that he is here because of all the things that he has been through before.  He denies any homicidal ideation.  PAST PSYCHIATRIC HISTORY:  The patient has had multiple admissions.  He was here last year in May 2011.  No current outpatient mental health treatment.  SOCIAL HISTORY:  This is a 55 year old single male who resides in [place].  He lives alone.  FAMILY HISTORY:  None.  ALCOHOL/DRUG HISTORY:  Urine drug screen is positive for cocaine. Negative for opiates.  PRIMARY CARE PROVIDER:  Merlene [name]. Renae Gloss, M.D. in [place].  MEDICAL PROBLEMS: 1. History of prostate cancer with surgery in May 2011. 2. Type 2 diabetes. 3. Hypertension. 4. History of deep vein thrombosis.  MEDICATIONS:  The medications were clarified at [facility] at phone number [phone], pharmacist lists: 1. Losartan 50 mg one daily. 2. Percocet 5/325 one every 8 hours.  Filled on March 19, 2011,     received 90 pills. 3. Warfarin 10 mg one daily along with 2 mg one daily. 4. Ambien 10 mg nightly p.r.n. 5. Metformin 500 mg one b.i.d.  PHYSICAL EXAMINATION:  This is a well-nourished, middle-aged male.  He appears in no distress, complaining of generalized pain.  LABORATORY DATA:  PT 12.9 with an INR 0.95.   Alcohol level is less than 11.  His CMP shows a blood sugar at 107.  Urine drug screen is positive for cocaine.  His CBC is essentially within normal limits.  MENTAL STATUS EXAM:  The patient is in bed.  He is complaining of being tired.  He is irritable.  Face down on the mattress.  No eye contact. He is currently dressed in a hospital gown.  Mood is depressed.  He appears blunt and again irritable.  Thought process overall are coherent and goal directed.  He offers little.  Denying any suicidal thoughts at this time.  Promises safety.  His memory appears intact.  Judgment and insight fair.  AXIS I:  Major depressive disorder, severe.  Cocaine abuse, rule out dependence. AXIS II:  Deferred. AXIS III:  History of non-insulin dependent diabetes, hypertension, deep venous thrombosis.  History of prostate cancer. AXIS IV:  Medical problems, possible other psychosocial problems. AXIS V:  Current is 35-40.  PLAN:  Our plan is to review medications.  We will address his substance use.  We will monitor his blood sugars on a b.i.d. basis.  Have pharmacy monitor his Coumadin levels and address his support.     Todd [name],  N.P.   ______________________________ Todd Gallo, MD    JO/MEDQ  D:  04/02/2011  T:  04/03/2011  Job:  [id number]  Electronically Signed by Limmie PatriciaP. on 04/12/2011 09:20:10 AM Electronically Signed by Todd Gallo MD on 04/12/2011 03:40:00 PM

## 2011-04-13 LAB — GLUCOSE, CAPILLARY: Glucose-Capillary: 128 mg/dL — ABNORMAL HIGH (ref 70–99)

## 2011-04-14 LAB — GLUCOSE, CAPILLARY
Glucose-Capillary: 102 mg/dL — ABNORMAL HIGH (ref 70–99)
Glucose-Capillary: 111 mg/dL — ABNORMAL HIGH (ref 70–99)

## 2011-04-14 LAB — PROTIME-INR: Prothrombin Time: 25.5 seconds — ABNORMAL HIGH (ref 11.6–15.2)

## 2011-04-15 ENCOUNTER — Ambulatory Visit: Payer: BC Managed Care – PPO | Admitting: Gastroenterology
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
## 2013-02-07 ENCOUNTER — Other Ambulatory Visit: Payer: Self-pay | Admitting: Internal Medicine

## 2013-02-07 DIAGNOSIS — C220 Liver cell carcinoma: Secondary | ICD-10-CM

## 2013-02-12 ENCOUNTER — Ambulatory Visit
Admission: RE | Admit: 2013-02-12 | Discharge: 2013-02-12 | Disposition: A | Discharge status: Home / Self Care | Payer: Medicare Other | Source: Ambulatory Visit | Attending: Internal Medicine | Admitting: Internal Medicine

## 2013-02-12 DIAGNOSIS — C220 Liver cell carcinoma: Secondary | ICD-10-CM

## 2013-07-18 ENCOUNTER — Other Ambulatory Visit: Payer: Self-pay | Admitting: Internal Medicine

## 2013-07-18 DIAGNOSIS — C220 Liver cell carcinoma: Secondary | ICD-10-CM

## 2013-07-20 ENCOUNTER — Ambulatory Visit (INDEPENDENT_AMBULATORY_CARE_PROVIDER_SITE_OTHER): Payer: Self-pay

## 2013-07-20 ENCOUNTER — Ambulatory Visit (INDEPENDENT_AMBULATORY_CARE_PROVIDER_SITE_OTHER): Payer: Medicare Other | Admitting: Podiatrist

## 2013-07-20 VITALS — BP 112/75 | HR 82 | Resp 18

## 2013-07-20 DIAGNOSIS — B351 Tinea unguium: Secondary | ICD-10-CM

## 2013-07-20 DIAGNOSIS — L608 Other nail disorders: Secondary | ICD-10-CM

## 2013-07-20 DIAGNOSIS — M79609 Pain in unspecified limb: Secondary | ICD-10-CM

## 2013-07-20 NOTE — Progress Notes (Signed)
   Subjective:    Patient ID: Todd [name], male    DOB: [birth year], 57 y.o.   MRN: [id number]  HPI  I am here to get my nails trimmed    Review of Systems     Objective:   Physical Exam        Assessment & Plan:

## 2013-07-20 NOTE — Progress Notes (Signed)
  HPI:  Patient presents today for follow up of foot and nail care. Denies any new complaints today. Patient waited to see Dr. Ralene Cork but became unhappy with the wait time.  Patient requested to see me today for nail care.   Objective:  Patients chart is reviewed.  Neurovascular status unchanged.  Patients nails are thickened, discolored, distrophic, friable and brittle with yellow-brown discoloration. Patient subjectively relates they are painful with shoes and with ambulation of bilateral feet.  Assessment:  Symptomatic onychomycosis  Plan:  Discussed treatment options and alternatives.  The symptomatic toenails were debrided through manual an mechanical means without complication.  Return appointment recommended at routine intervals of 3 months    Todd [name], DPM

## 2013-07-20 NOTE — Patient Instructions (Signed)
Diabetes and Foot Care Diabetes may cause you to have problems because of poor blood supply (circulation) to your feet and legs. This may cause the skin on your feet to become thinner, break easier, and heal more slowly. Your skin may become dry, and the skin may peel and crack. You may also have nerve damage in your legs and feet causing decreased feeling in them. You may not notice minor injuries to your feet that could lead to infections or more serious problems. Taking care of your feet is one of the most important things you can do for yourself.  HOME CARE INSTRUCTIONS  Wear shoes at all times, even in the house. Do not go barefoot. Bare feet are easily injured.  Check your feet daily for blisters, cuts, and redness. If you cannot see the bottom of your feet, use a mirror or ask someone for help.  Wash your feet with warm water (do not use hot water) and mild soap. Then pat your feet and the areas between your toes until they are completely dry. Do not soak your feet as this can dry your skin.  Apply a moisturizing lotion or petroleum jelly (that does not contain alcohol and is unscented) to the skin on your feet and to dry, brittle toenails. Do not apply lotion between your toes.  Trim your toenails straight across. Do not dig under them or around the cuticle. File the edges of your nails with an emery board or nail file.  Do not cut corns or calluses or try to remove them with medicine.  Wear clean socks or stockings every day. Make sure they are not too tight. Do not wear knee-high stockings since they may decrease blood flow to your legs.  Wear shoes that fit properly and have enough cushioning. To break in new shoes, wear them for just a few hours a day. This prevents you from injuring your feet. Always look in your shoes before you put them on to be sure there are no objects inside.  Do not cross your legs. This may decrease the blood flow to your feet.  If you find a minor scrape,  cut, or break in the skin on your feet, keep it and the skin around it clean and dry. These areas may be cleansed with mild soap and water. Do not cleanse the area with peroxide, alcohol, or iodine.  When you remove an adhesive bandage, be sure not to damage the skin around it.  If you have a wound, look at it several times a day to make sure it is healing.  Do not use heating pads or hot water bottles. They may burn your skin. If you have lost feeling in your feet or legs, you may not know it is happening until it is too late.  Make sure your health care provider performs a complete foot exam at least annually or more often if you have foot problems. Report any cuts, sores, or bruises to your health care provider immediately. SEEK MEDICAL CARE IF:   You have an injury that is not healing.  You have cuts or breaks in the skin.  You have an ingrown nail.  You notice redness on your legs or feet.  You feel burning or tingling in your legs or feet.  You have pain or cramps in your legs and feet.  Your legs or feet are numb.  Your feet always feel cold. SEEK IMMEDIATE MEDICAL CARE IF:   There is increasing redness,   swelling, or pain in or around a wound.  There is a red line that goes up your leg.  Pus is coming from a wound.  You develop a fever or as directed by your health care provider.  You notice a bad smell coming from an ulcer or wound. Document Released: 07/30/2000 Document Revised: 04/04/2013 Document Reviewed: 01/09/2013 [employer] Patient Information 2014 [employer].  

## 2013-07-24 ENCOUNTER — Ambulatory Visit
Admission: RE | Admit: 2013-07-24 | Discharge: 2013-07-24 | Disposition: A | Discharge status: Home / Self Care | Payer: Medicare Other | Source: Ambulatory Visit | Attending: Internal Medicine | Admitting: Internal Medicine

## 2013-07-24 DIAGNOSIS — C220 Liver cell carcinoma: Secondary | ICD-10-CM

## 2013-10-19 ENCOUNTER — Encounter: Payer: Self-pay | Admitting: Podiatrist

## 2013-10-19 ENCOUNTER — Ambulatory Visit (INDEPENDENT_AMBULATORY_CARE_PROVIDER_SITE_OTHER): Payer: Medicare Other | Admitting: Podiatrist

## 2013-10-19 VITALS — BP 146/89 | HR 74 | Resp 18

## 2013-10-19 DIAGNOSIS — E1149 Type 2 diabetes mellitus with other diabetic neurological complication: Secondary | ICD-10-CM

## 2013-10-19 DIAGNOSIS — Q828 Other specified congenital malformations of skin: Secondary | ICD-10-CM

## 2013-10-19 DIAGNOSIS — B351 Tinea unguium: Secondary | ICD-10-CM

## 2013-10-19 DIAGNOSIS — M216X9 Other acquired deformities of unspecified foot: Secondary | ICD-10-CM

## 2013-10-19 DIAGNOSIS — M79609 Pain in unspecified limb: Secondary | ICD-10-CM

## 2013-10-19 DIAGNOSIS — E1140 Type 2 diabetes mellitus with diabetic neuropathy, unspecified: Secondary | ICD-10-CM

## 2013-10-19 NOTE — Progress Notes (Deleted)
I am here to get my 3 month toenail trim and to have my calluses trimmed also  HPI:  Patient presents today for follow up of foot and nail care. Denies any new complaints today.  Objective:  Patients chart is reviewed.  Vascular status reveals pedal pulses noted at 2 out of 4 dp and pt bilateral .  Neurological sensation is Normal to [employer] monofilament bilateral.  Patients nails are thickened, discolored, distrophic, friable and brittle with yellow-brown discoloration. Patient subjectively relates they are painful with shoes and with ambulation of bilateral feet.  Assessment:  Symptomatic onychomycosis  Plan:  Discussed treatment options and alternatives.  The symptomatic toenails were debrided through manual an mechanical means without complication.  Return appointment recommended at routine intervals of 3 months    Trudie Buckler, DPM

## 2013-10-24 NOTE — Progress Notes (Signed)
I am here to get my 3 month toenail trim and to have my calluses trimmed also.  Patient is diabetic and relates neuopathy with tingling and burning to bilateral feet.    HPI:  Patient presents today for follow up of foot and nail care. Denies any new complaints today.  Objective:  Patients chart is reviewed.  Vascular status reveals pedal pulses noted at 2 out of 4 dp and pt bilateral .  Neurological sensation is Normal to [employer] monofilament bilateral. Light touch is decreased bilateral.  Vibratory sensation is absent bilateral.  Patients nails are thickened, discolored, distrophic, friable and brittle with yellow-brown discoloration. Patient subjectively relates they are painful with shoes and with ambulation of bilateral feet.  Assessment:  Symptomatic onychomycosis, diabetes with neuropathy  Plan:  Discussed treatment options and alternatives.  The symptomatic toenails were debrided through manual an mechanical means without complication.  Return appointment recommended at routine intervals of 3 months    Trudie Buckler, DPM

## 2013-10-31 ENCOUNTER — Emergency Department (HOSPITAL_COMMUNITY): Payer: Medicare Other

## 2013-10-31 ENCOUNTER — Encounter (HOSPITAL_COMMUNITY): Payer: Self-pay | Admitting: Emergency Medicine

## 2013-10-31 ENCOUNTER — Emergency Department (HOSPITAL_COMMUNITY)
Admission: EM | Admit: 2013-10-31 | Discharge: 2013-10-31 | Disposition: A | Discharge status: Home / Self Care | Payer: Medicare Other | Attending: Emergency Medicine | Admitting: Emergency Medicine

## 2013-10-31 DIAGNOSIS — Z8546 Personal history of malignant neoplasm of prostate: Secondary | ICD-10-CM | POA: Insufficient documentation

## 2013-10-31 DIAGNOSIS — H8110 Benign paroxysmal vertigo, unspecified ear: Secondary | ICD-10-CM

## 2013-10-31 DIAGNOSIS — Z79899 Other long term (current) drug therapy: Secondary | ICD-10-CM | POA: Insufficient documentation

## 2013-10-31 DIAGNOSIS — Z8739 Personal history of other diseases of the musculoskeletal system and connective tissue: Secondary | ICD-10-CM | POA: Insufficient documentation

## 2013-10-31 DIAGNOSIS — R51 Headache: Secondary | ICD-10-CM | POA: Insufficient documentation

## 2013-10-31 DIAGNOSIS — E119 Type 2 diabetes mellitus without complications: Secondary | ICD-10-CM | POA: Insufficient documentation

## 2013-10-31 LAB — BASIC METABOLIC PANEL
BUN: 15 mg/dL (ref 6–23)
CHLORIDE: 99 meq/L (ref 96–112)
CO2: 25 mEq/L (ref 19–32)
Calcium: 10.1 mg/dL (ref 8.4–10.5)
Creatinine, Ser: 0.61 mg/dL (ref 0.50–1.35)
GLUCOSE: 134 mg/dL — AB (ref 70–99)
Potassium: 3.8 mEq/L (ref 3.7–5.3)
Sodium: 138 mEq/L (ref 137–147)

## 2013-10-31 LAB — CBC
HEMATOCRIT: 39.2 % (ref 39.0–52.0)
HEMOGLOBIN: 13 g/dL (ref 13.0–17.0)
MCH: 28.8 pg (ref 26.0–34.0)
MCHC: 33.2 g/dL (ref 30.0–36.0)
MCV: 86.9 fL (ref 78.0–100.0)
Platelets: 250 10*3/uL (ref 150–400)
RBC: 4.51 MIL/uL (ref 4.22–5.81)
RDW: 12.7 % (ref 11.5–15.5)
WBC: 5.7 10*3/uL (ref 4.0–10.5)

## 2013-10-31 MED ORDER — SODIUM CHLORIDE 0.9 % IV BOLUS (SEPSIS)
1000.0000 mL | Freq: Once | INTRAVENOUS | Status: AC
  Administered 2013-10-31: 1000 mL via INTRAVENOUS

## 2013-10-31 MED ORDER — MECLIZINE HCL 25 MG PO TABS
25.0000 mg | ORAL_TABLET | Freq: Once | ORAL | Status: AC
  Administered 2013-10-31: 25 mg via ORAL
  Filled 2013-10-31: qty 1

## 2013-10-31 MED ORDER — GADOBENATE DIMEGLUMINE 529 MG/ML IV SOLN
20.0000 mL | Freq: Once | INTRAVENOUS | Status: AC | PRN
  Administered 2013-10-31: 20 mL via INTRAVENOUS

## 2013-10-31 MED ORDER — MECLIZINE HCL 50 MG PO TABS: 50.0000 mg | ORAL_TABLET | Freq: Three times a day (TID) | ORAL | Status: DC | PRN

## 2013-10-31 NOTE — ED Notes (Signed)
Pt out of room for MRI.

## 2013-10-31 NOTE — ED Provider Notes (Addendum)
CSN: [id number]     Arrival date & time 10/31/13  1453 History   First MD Initiated Contact with Patient 10/31/13 1537     Chief Complaint  Patient presents with  . Headache  . Dizziness     (Consider location/radiation/quality/duration/timing/severity/associated sxs/prior Treatment) HPI Pt presents with c/o dizziness and sensation that room is spinning- this started yesterday approx 3pm.  No headache.  No changes in vision or speech,no weakness in arms or legs.  He states he feels very unsteady on his feet.  Symptoms are worse with movement of head.  Symptoms are very distressing to the patient.  He has not had any treatment fr hs symptoms.  There are no other associated systemic symptoms, there are no other alleviating or modifying factors.   Past Medical History  Diagnosis Date  . Diabetes mellitus   . Arthritis   . Cancer     PROSTATE   Past Surgical History  Procedure Laterality Date  . Prostate surgery    . Knee surgery      QUADS   Family History  Problem Relation Age of Onset  . Cancer Father     PROSTATE   History  Substance Use Topics  . Smoking status: Never Smoker   . Smokeless tobacco: Never Used  . Alcohol Use: No    Review of Systems ROS reviewed and all otherwise negative except for mentioned in HPI    Allergies  Review of patient's allergies indicates no known allergies.  Home Medications   Current Outpatient Rx  Name  Route  Sig  Dispense  Refill  . Ledipasvir-Sofosbuvir (HARVONI) 90-400 MG TABS   Oral   Take 1 tablet by mouth daily.         Marland Kitchen losartan (COZAAR) 100 MG tablet   Oral   Take 100 mg by mouth daily.         . metFORMIN (GLUCOPHAGE) 1000 MG tablet   Oral   Take 1,000 mg by mouth 2 (two) times daily with a meal.         . sitaGLIPtin (JANUVIA) 100 MG tablet   Oral   Take 100 mg by mouth at bedtime.         Marland Kitchen venlafaxine (EFFEXOR) 75 MG tablet   Oral   Take 75 mg by mouth 2 (two) times daily.         .  meclizine (ANTIVERT) 50 MG tablet   Oral   Take 1 tablet (50 mg total) by mouth 3 (three) times daily as needed.   30 tablet   0    BP 153/103  Pulse 71  Temp(Src) 98 F (36.7 C) (Oral)  Resp 20  SpO2 96% Vitals reviewed Physical Exam Physical Examination: General appearance - alert, well appearing, and in no distress Mental status - alert, oriented to person, place, and time Eyes - PERRL, EOMI Mouth - mucous membranes moist, pharynx normal without lesions Neck - supple, no significant adenopathy Chest - clear to auscultation, no wheezes, rales or rhonchi, symmetric air entry Heart - normal rate, regular rhythm, normal S1, S2, no murmurs, rubs, clicks or gallops Abdomen - soft, nontender, nondistended, no masses or organomegaly Neurological - alert, oriented x 3, cranial nerves 2-12 tested and intact, strength 5/5 in extremities x 4, sensation intact, vertigo recurs with turning head side to side Extremities - peripheral pulses normal, no pedal edema, no clubbing or cyanosis Skin - normal coloration and turgor, no rashes  ED Course  Procedures (including  critical care time) Labs Review Labs Reviewed  BASIC METABOLIC PANEL - Abnormal; Notable for the following:    Glucose, Bld 134 (*)    All other components within normal limits  CBC   Imaging Review Ct Head Wo Contrast  10/31/2013   CLINICAL DATA:  Headache, dizziness  EXAM: CT HEAD WITHOUT CONTRAST  TECHNIQUE: Contiguous axial images were obtained from the base of the skull through the vertex without intravenous contrast.  COMPARISON:  None.  FINDINGS: There is no evidence of mass effect, midline shift or extra-axial fluid collections. There is no evidence of a space-occupying lesion or intracranial hemorrhage. There is no evidence of a cortical-based area of acute infarction. There is periventricular white matter low attenuation likely secondary to microangiopathy.  The ventricles and sulci are appropriate for the patient's  age. The basal cisterns are patent.  Visualized portions of the orbits are unremarkable. The visualized portions of the paranasal sinuses and mastoid air cells are unremarkable.  The osseous structures are unremarkable.  IMPRESSION: No acute intracranial pathology.   Electronically Signed   By: Kathreen Devoid   On: 10/31/2013 17:02   Mr Brain W Wo Contrast  10/31/2013   CLINICAL DATA:  Headaches and vertigo since yesterday. History of prostate cancer.  EXAM: MRI HEAD WITHOUT AND WITH CONTRAST  TECHNIQUE: Multiplanar, multiecho pulse sequences of the brain and surrounding structures were obtained without and with intravenous contrast.  CONTRAST:  3mL MULTIHANCE GADOBENATE DIMEGLUMINE 529 MG/ML IV SOLN  COMPARISON:  CT HEAD W/O CM dated 10/31/2013  FINDINGS: The diffusion-weighted images demonstrate no evidence for acute or subacute infarction. Extensive periventricular and subcortical white matter changes extend into the brainstem. Clustered areas of subcortical white matter disease are evident bilaterally.  Flow is present in the major intracranial arteries. The globes and orbits are intact. Mild mucosal thickening is evident in the maxillary sinuses bilaterally. There is some fluid in mastoid cells bilaterally, left greater than right.  The postcontrast images demonstrate no pathologic enhancement of the then a benign venous angioma in the right frontal operculum. Degenerative changes are noted about the dens.  IMPRESSION: 1. 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 The finding is nonspecific but can be seen in the setting of chronic microvascular ischemia, a demyelinating process such as multiple sclerosis, vasculitis, complicated migraine headaches, or as the sequelae of a prior infectious or inflammatory process. 2. No acute intracranial abnormality. 3. Mild maxillary sinus disease.   Electronically Signed   By: Lawrence Santiago M.D.   On: 10/31/2013 19:06     EKG Interpretation   Date/Time:  Wednesday  October 31 2013 16:33:55 EDT Ventricular Rate:  69 PR Interval:  158 QRS Duration: 144 QT Interval:  449 QTC Calculation: 481 R Axis:   -70 Text Interpretation:  Sinus rhythm RBBB and LAFB No significant change  since last tracing Confirmed by [facility]  MD, Shadrach Bartunek [phone]) on 10/31/2013  9:19:57 PM      MDM   Final diagnoses:  BPPV (benign paroxysmal positional vertigo)    Pt presenting with c/o vertigo.  No other focal neuro symptoms, exam is reassuring except vertigo is produced with turning head side to side.  CT reassuring.  MRI shows no acute findings or infarction.  He was treated with meclizine and has had some improvement in his symptoms.  D/c and given followup information for neurology.  Discharged with strict return precautions.  Pt agreeable with plan.    Threasa Beards, MD 10/31/13 2028  Orlean Patten  Canary Brim, MD 10/31/13 2120

## 2013-10-31 NOTE — ED Notes (Signed)
Pt c/o dizziness and headache since yesterday. Denies NVD.  No slurred speech, no facial droop, no weakness.

## 2013-10-31 NOTE — Discharge Instructions (Signed)
Return to the ED with any concerns including weakness of arms or legs, changes in vision or speech, decreased level of alertnesss/lethargy, or any other alarming symptoms

## 2013-11-08 ENCOUNTER — Ambulatory Visit (INDEPENDENT_AMBULATORY_CARE_PROVIDER_SITE_OTHER): Payer: Medicare Other | Admitting: Neurology

## 2013-11-08 ENCOUNTER — Encounter: Payer: Self-pay | Admitting: Neurology

## 2013-11-08 VITALS — BP 133/90 | HR 90 | Ht 67.5 in | Wt 266.0 lb

## 2013-11-08 DIAGNOSIS — H8110 Benign paroxysmal vertigo, unspecified ear: Secondary | ICD-10-CM | POA: Insufficient documentation

## 2013-11-08 NOTE — Patient Instructions (Signed)
I had a long discussion with the patient regarding his positional vertigo, discuss results of her evaluation and answered questions. I recommend referral to vestibular rehabilitation for dizziness exercises. Continue meclizine 25 mg 3 times daily. Check MRI scan of the brain and internal auditory canal with and without contrast to rule out any structural brain lesions. Return for followup in 2 months or call earlier if necessary  Vertigo Vertigo means you feel like you or your surroundings are moving when they are not. Vertigo can be dangerous if it occurs when you are at work, driving, or performing difficult activities.  CAUSES  Vertigo occurs when there is a conflict of signals sent to your brain from the visual and sensory systems in your body. There are many different causes of vertigo, including:  Infections, especially in the inner ear.  A bad reaction to a drug or misuse of alcohol and medicines.  Withdrawal from drugs or alcohol.  Rapidly changing positions, such as lying down or rolling over in bed.  A migraine headache.  Decreased blood flow to the brain.  Increased pressure in the brain from a head injury, infection, tumor, or bleeding. SYMPTOMS  You may feel as though the world is spinning around or you are falling to the ground. Because your balance is upset, vertigo can cause nausea and vomiting. You may have involuntary eye movements (nystagmus). DIAGNOSIS  Vertigo is usually diagnosed by physical exam. If the cause of your vertigo is unknown, your caregiver may perform imaging tests, such as an MRI scan (magnetic resonance imaging). TREATMENT  Most cases of vertigo resolve on their own, without treatment. Depending on the cause, your caregiver may prescribe certain medicines. If your vertigo is related to body position issues, your caregiver may recommend movements or procedures to correct the problem. In rare cases, if your vertigo is caused by certain inner ear problems,  you may need surgery. HOME CARE INSTRUCTIONS   Follow your caregiver's instructions.  Avoid driving.  Avoid operating heavy machinery.  Avoid performing any tasks that would be dangerous to you or others during a vertigo episode.  Tell your caregiver if you notice that certain medicines seem to be causing your vertigo. Some of the medicines used to treat vertigo episodes can actually make them worse in some people. SEEK IMMEDIATE MEDICAL CARE IF:   Your medicines do not relieve your vertigo or are making it worse.  You develop problems with talking, walking, weakness, or using your arms, hands, or legs.  You develop severe headaches.  Your nausea or vomiting continues or gets worse.  You develop visual changes.  A family member notices behavioral changes.  Your condition gets worse. MAKE SURE YOU:  Understand these instructions.  Will watch your condition.  Will get help right away if you are not doing well or get worse. Document Released: 05/12/2005 Document Revised: 10/25/2011 Document Reviewed: 02/18/2011 [facility] Patient Information 2014 [place].

## 2013-11-09 NOTE — Progress Notes (Signed)
[facility] [address] [place]. [place] [address] [phone]       OFFICE CONSULT NOTE  [name] Date of Birth:  [birth year] Medical Record Number:  [id number]   Referring MD:  Todd [name]  Reason for Referral:  vertigo  HPI: 58 year male who woke up on 10/30/13 with sudden onset of vertigo and dizziness. He noticed it when he was particularly moving his neck to one side. He could not get out of bed and had to lie down for quite some time. When the family what appeared to hold onto the walls to walk. He was seen in in the emergency room and 10/31/13 her exam was found to be nonfocal. CT scan of the head showed no abnormality. He was diagnosed with vertigo and started on meclizine 25 mg three times daily which  seems to be helping. However history Vertigo Intermittently. He Is Able to Walk. He Denies Any Ear Pain, Ringing in the Ears, Decreased Hearing. Has No Prior History of Vertigo, Ear Infection, Pain or Significant Head Injury. Hospital Excessive Amounts of Prostate Cancer Which Was Undergone Surgery. Has No Prior History of Strokes, TIAs Seizures or Significant Neurological Problems.  ROS:   14 system review of systems is positive for weight gain fatigue spinning sensation headache confusion dizziness sleepiness snoring and all the systems negative  PMH:  Past Medical History  Diagnosis Date  . Diabetes mellitus   . Arthritis   . Cancer     PROSTATE    Social History:  History   Social History  . Marital Status: Single    Spouse Name: N/A    Number of Children: 2  . Years of Education: 12th   Occupational History  . post office    Social History Main Topics  . Smoking status: Former Research scientist (life sciences)  . Smokeless tobacco: Never Used  . Alcohol Use: No  . Drug Use: No  . Sexual Activity: Not on file   Other Topics Concern  . Not on file   Social History Narrative   Patient lives at home alone.Marland KitchenMarland KitchenDrinks coffee daily     Medications:     Current Outpatient Prescriptions on File Prior to Visit  Medication Sig Dispense Refill  . Ledipasvir-Sofosbuvir (HARVONI) 90-400 MG TABS Take 1 tablet by mouth daily.      Marland Kitchen losartan (COZAAR) 100 MG tablet Take 100 mg by mouth daily.      . meclizine (ANTIVERT) 50 MG tablet Take 1 tablet (50 mg total) by mouth 3 (three) times daily as needed.  30 tablet  0  . metFORMIN (GLUCOPHAGE) 1000 MG tablet Take 1,000 mg by mouth 2 (two) times daily with a meal.      . sitaGLIPtin (JANUVIA) 100 MG tablet Take 100 mg by mouth at bedtime.      Marland Kitchen venlafaxine (EFFEXOR) 75 MG tablet Take 75 mg by mouth 2 (two) times daily.       No current facility-administered medications on file prior to visit.    Allergies:  No Known Allergies  Physical Exam General: well developed, well nourished, seated, in no evident distress Head: head normocephalic and atraumatic. Orohparynx benign Neck: supple with no carotid or supraclavicular bruits Cardiovascular: regular rate and rhythm, no murmurs Musculoskeletal: no deformity Skin:  no rash/petichiae Vascular:  Normal pulses all extremities Filed Vitals:   11/08/13 1248  BP: 133/90  Pulse: 90    Neurologic Exam Mental Status: Awake and fully alert. Oriented to place and time. Recent  and remote memory intact. Attention span, concentration and fund of knowledge appropriate. Mood and affect appropriate.  Cranial Nerves: Fundoscopic exam reveals sharp disc margins. Pupils equal, briskly reactive to light. Extraocular movements full without nystagmus. Visual fields full to confrontation. Hearing intact. Facial sensation intact. Face, tongue, palate moves normally and symmetrically.  Motor: Normal bulk and tone. Normal strength in all tested extremity muscles. Sensory.: intact to touch and pinprick and vibratory sensation.  Coordination: Rapid alternating movements normal in all extremities. Finger-to-nose and heel-to-shin performed accurately bilaterally. Head shaking  no nystagmus. Fukuda stepping test positive and patient clearly moves off the base and take several steps. Hall Pike maneuver positive with head turned to the right Gait and Station: Arises from chair without difficulty. Stance is normal. Gait demonstrates normal stride length and balance . Able to heel, toe and tandem walk without difficulty.  Reflexes: 1+ and symmetric. Toes downgoing.      ASSESSMENT: 58 year male with sudden onset of vertigo and dizziness 10 days ago likely paroxysmal positional vertigo. Brainstem vestibular central lesion is possible although less likely.    PLAN: I had a long discussion with the patient regarding his positional vertigo, discuss results of her evaluation and answered questions. I recommend referral to vestibular rehabilitation for dizziness exercises. Continue meclizine 25 mg 3 times daily. Check MRI scan of the brain and internal auditory canal with and without contrast to rule out any structural brain lesions. Return for followup in 2 months or call earlier if necessary    Note: This document was prepared with digital dictation and possible smart phrase technology. Any transcriptional errors that result from this process are unintentional.

## 2013-12-10 ENCOUNTER — Other Ambulatory Visit: Payer: Self-pay | Admitting: Gastroenterology

## 2014-01-11 ENCOUNTER — Ambulatory Visit (INDEPENDENT_AMBULATORY_CARE_PROVIDER_SITE_OTHER): Payer: Medicare Other | Admitting: *Deleted

## 2014-01-11 DIAGNOSIS — E1140 Type 2 diabetes mellitus with diabetic neuropathy, unspecified: Secondary | ICD-10-CM

## 2014-01-11 DIAGNOSIS — E1142 Type 2 diabetes mellitus with diabetic polyneuropathy: Secondary | ICD-10-CM

## 2014-01-11 DIAGNOSIS — E1149 Type 2 diabetes mellitus with other diabetic neurological complication: Secondary | ICD-10-CM

## 2014-01-11 NOTE — Progress Notes (Signed)
   Subjective:    Patient ID: Todd [name], male    DOB: 1956-06-29, 58 y.o.   MRN: [id number]  HPI  DIABETIC SHOE MEASUREMENT.  Review of Systems     Objective:   Physical Exam        Assessment & Plan:

## 2014-01-15 ENCOUNTER — Ambulatory Visit: Payer: Medicare Other | Admitting: Neurology

## 2014-01-28 ENCOUNTER — Encounter (HOSPITAL_COMMUNITY): Payer: Self-pay | Admitting: Pharmacy Technician

## 2014-02-04 ENCOUNTER — Encounter (HOSPITAL_COMMUNITY): Payer: Self-pay | Admitting: *Deleted

## 2014-02-13 ENCOUNTER — Telehealth: Payer: Self-pay | Admitting: *Deleted

## 2014-02-13 ENCOUNTER — Ambulatory Visit: Payer: Medicare Other | Admitting: Podiatrist

## 2014-02-13 DIAGNOSIS — M216X9 Other acquired deformities of unspecified foot: Secondary | ICD-10-CM

## 2014-02-13 DIAGNOSIS — E1140 Type 2 diabetes mellitus with diabetic neuropathy, unspecified: Secondary | ICD-10-CM

## 2014-02-13 NOTE — Progress Notes (Signed)
   Patient presents today to pick up his diabetic shoes. Complains of shoes are too tight.  shoes are returned for a wider pair. We will contact him when these are ready for pick up.

## 2014-02-13 NOTE — Telephone Encounter (Signed)
Pt presented for diabetic shoe and custom molded insert pick-up.  The Beast 14 in size 10.5 wide 2E was too tight and the pt requested to have a 10.5 x-wide to be ordered.  I will have Clovis Riley reorder in the wider size.

## 2014-02-18 ENCOUNTER — Encounter (HOSPITAL_COMMUNITY): Payer: Self-pay | Admitting: Anesthesiology

## 2014-02-18 NOTE — Anesthesia Preprocedure Evaluation (Addendum)
Anesthesia Evaluation  Patient identified by MRN, date of birth, ID band Patient awake    Reviewed: Allergy & Precautions, H&P , NPO status , Patient's Chart, lab work & pertinent test results  Airway Mallampati: II TM Distance: >3 FB Neck ROM: Full    Dental no notable dental hx.    Pulmonary sleep apnea , former smoker,  breath sounds clear to auscultation  Pulmonary exam normal       Cardiovascular hypertension, Pt. on medications + dysrhythmias Rhythm:Regular Rate:Normal  RBBB   Neuro/Psych PSYCHIATRIC DISORDERS Depression negative neurological ROS     GI/Hepatic negative GI ROS, (+) Hepatitis -  Endo/Other  diabetes, Type 2, Oral Hypoglycemic AgentsMorbid obesity  Renal/GU negative Renal ROS  negative genitourinary   Musculoskeletal negative musculoskeletal ROS (+)   Abdominal   Peds negative pediatric ROS (+)  Hematology negative hematology ROS (+)   Anesthesia Other Findings   Reproductive/Obstetrics negative OB ROS                          Anesthesia Physical Anesthesia Plan  ASA: III  Anesthesia Plan: [name]   Post-op Pain Management:    Induction: Intravenous  Airway Management Planned:   Additional Equipment:   Intra-op Plan:   Post-operative Plan:   Informed Consent: I have reviewed the patients History and Physical, chart, labs and discussed the procedure including the risks, benefits and alternatives for the proposed anesthesia with the patient or authorized representative who has indicated his/her understanding and acceptance.   Dental advisory given  Plan Discussed with: CRNA  Anesthesia Plan Comments:         Anesthesia Quick Evaluation

## 2014-02-19 ENCOUNTER — Encounter (HOSPITAL_COMMUNITY): Payer: Self-pay

## 2014-02-19 ENCOUNTER — Ambulatory Visit (HOSPITAL_COMMUNITY)
Admission: RE | Admit: 2014-02-19 | Discharge: 2014-02-19 | Disposition: A | Discharge status: Home / Self Care | Payer: Medicare Other | Source: Ambulatory Visit | Attending: Gastroenterology | Admitting: Gastroenterology

## 2014-02-19 ENCOUNTER — Encounter (HOSPITAL_COMMUNITY): Payer: Medicare Other | Admitting: Anesthesiology

## 2014-02-19 ENCOUNTER — Ambulatory Visit (HOSPITAL_COMMUNITY): Payer: Medicare Other | Admitting: Anesthesiology

## 2014-02-19 ENCOUNTER — Encounter (HOSPITAL_COMMUNITY)
Admission: RE | Disposition: A | Discharge status: Home / Self Care | Payer: Self-pay | Source: Ambulatory Visit | Attending: Gastroenterology

## 2014-02-19 DIAGNOSIS — Z8546 Personal history of malignant neoplasm of prostate: Secondary | ICD-10-CM | POA: Diagnosis not present

## 2014-02-19 DIAGNOSIS — F329 Major depressive disorder, single episode, unspecified: Secondary | ICD-10-CM | POA: Diagnosis not present

## 2014-02-19 DIAGNOSIS — E119 Type 2 diabetes mellitus without complications: Secondary | ICD-10-CM | POA: Insufficient documentation

## 2014-02-19 DIAGNOSIS — Z1211 Encounter for screening for malignant neoplasm of colon: Secondary | ICD-10-CM | POA: Insufficient documentation

## 2014-02-19 DIAGNOSIS — B182 Chronic viral hepatitis C: Secondary | ICD-10-CM | POA: Diagnosis not present

## 2014-02-19 DIAGNOSIS — I10 Essential (primary) hypertension: Secondary | ICD-10-CM | POA: Diagnosis not present

## 2014-02-19 DIAGNOSIS — G4730 Sleep apnea, unspecified: Secondary | ICD-10-CM | POA: Diagnosis not present

## 2014-02-19 DIAGNOSIS — Z87891 Personal history of nicotine dependence: Secondary | ICD-10-CM | POA: Insufficient documentation

## 2014-02-19 DIAGNOSIS — E669 Obesity, unspecified: Secondary | ICD-10-CM | POA: Insufficient documentation

## 2014-02-19 DIAGNOSIS — K5730 Diverticulosis of large intestine without perforation or abscess without bleeding: Secondary | ICD-10-CM | POA: Diagnosis not present

## 2014-02-19 DIAGNOSIS — Z6841 Body Mass Index (BMI) 40.0 and over, adult: Secondary | ICD-10-CM | POA: Insufficient documentation

## 2014-02-19 DIAGNOSIS — Z79899 Other long term (current) drug therapy: Secondary | ICD-10-CM | POA: Diagnosis not present

## 2014-02-19 DIAGNOSIS — F3289 Other specified depressive episodes: Secondary | ICD-10-CM | POA: Insufficient documentation

## 2014-02-19 HISTORY — PX: COLONOSCOPY WITH PROPOFOL: SHX5780

## 2014-02-19 HISTORY — DX: Sleep apnea, unspecified: G47.30

## 2014-02-19 HISTORY — DX: Essential (primary) hypertension: I10

## 2014-02-19 LAB — GLUCOSE, CAPILLARY: GLUCOSE-CAPILLARY: 132 mg/dL — AB (ref 70–99)

## 2014-02-19 SURGERY — COLONOSCOPY WITH PROPOFOL
Anesthesia: Monitor Anesthesia Care

## 2014-02-19 MED ORDER — PROPOFOL 10 MG/ML IV BOLUS
INTRAVENOUS | Status: DC | PRN
  Administered 2014-02-19: 50 mg via INTRAVENOUS
  Administered 2014-02-19: 25 mg via INTRAVENOUS
  Administered 2014-02-19: 50 mg via INTRAVENOUS
  Administered 2014-02-19: 100 mg via INTRAVENOUS

## 2014-02-19 MED ORDER — PROPOFOL 10 MG/ML IV BOLUS
INTRAVENOUS | Status: AC
  Filled 2014-02-19: qty 20

## 2014-02-19 MED ORDER — SODIUM CHLORIDE 0.9 % IV SOLN: INTRAVENOUS | Status: DC

## 2014-02-19 MED ORDER — LACTATED RINGERS IV SOLN
INTRAVENOUS | Status: DC | PRN
  Administered 2014-02-19: 09:00:00 via INTRAVENOUS

## 2014-02-19 SURGICAL SUPPLY — 22 items

## 2014-02-19 NOTE — Op Note (Signed)
Procedure: Screening colonoscopy  Endoscopist: Earle Gell  Premedication: Propofol administered by anesthesia  Procedure: The patient was placed in the left lateral decubitus position. Anal inspection and digital rectal exam were normal. The patient has undergone a prostatectomy for prostate cancer. The Pentax pediatric colonoscope was introduced into the rectum and advanced to the cecum. A normal-appearing appendiceal orifice and ileocecal valve were identified. Colonic preparation for the exam today was good.  Rectum. Normal. Retroflex view of the distal rectum normal  Sigmoid colon and descending colon. Normal. Left colonic diverticulosis.  Splenic flexure. Normal  Transverse colon. Normal  Hepatic flexure. Normal  Ascending colon. Normal  Cecum and ileocecal valve. Normal  Assessment: Normal screening colonoscopy  Recommendation: Schedule repeat screening colonoscopy in 10 years

## 2014-02-19 NOTE — H&P (Signed)
  Procedure: Baseline screening colonoscopy  History: The patient is a 58 year old male born 07-15-1956. He is scheduled to undergo his first screening colonoscopy with polypectomy to prevent colon cancer.  Past medical history: Prostatectomy in 2007. Right knee surgery in 2011. Treated hepatitis C with Harvoni. Hypertension. Type 2 diabetes mellitus with obesity. Left leg thrombophlebitis treated in 2014. Prostate cancer. Suppression with insomnia.  Habits: The patient is a former cigarette smoker.  Medication allergies: None  Exam: The patient is alert and lying comfortably on the endoscopy stretcher. Lungs are clear to auscultation. Cardiac exam reveals a regular rhythm. Abdomen is soft and nontender to palpation.  Plan: Proceed with baseline screening colonoscopy

## 2014-02-19 NOTE — Transfer of Care (Signed)
Immediate Anesthesia Transfer of Care Note  Patient: Todd [name]  Procedure(s) Performed: Procedure(s): COLONOSCOPY WITH PROPOFOL (N/A)  Patient Location: PACU  Anesthesia Type:[name]  Level of Consciousness: awake, sedated and patient cooperative  Airway & Oxygen Therapy: Patient Spontanous Breathing and Patient connected to face mask oxygen  Post-op Assessment: Report given to PACU RN and Post -op Vital signs reviewed and stable  Post vital signs: Reviewed and stable  Complications: No apparent anesthesia complications

## 2014-02-19 NOTE — Anesthesia Postprocedure Evaluation (Signed)
  Anesthesia Post-op Note  Patient: Todd [name]  Procedure(s) Performed: Procedure(s) (LRB): COLONOSCOPY WITH PROPOFOL (N/A)  Patient Location: PACU  Anesthesia Type: [name]  Level of Consciousness: awake and alert   Airway and Oxygen Therapy: Patient Spontanous Breathing  Post-op Pain: mild  Post-op Assessment: Post-op Vital signs reviewed, Patient's Cardiovascular Status Stable, Respiratory Function Stable, Patent Airway and No signs of Nausea or vomiting  Last Vitals:  Filed Vitals:   02/19/14 1043  BP: 133/95  Pulse: 67  Temp:   Resp: 19    Post-op Vital Signs: stable   Complications: No apparent anesthesia complications

## 2014-02-20 ENCOUNTER — Encounter (HOSPITAL_COMMUNITY): Payer: Self-pay | Admitting: Gastroenterology

## 2014-02-25 ENCOUNTER — Telehealth: Payer: Self-pay | Admitting: *Deleted

## 2014-02-25 NOTE — Telephone Encounter (Signed)
Calling about the exchange of some sneakers that were too small.  It's been several weeks now.  Have the sneakers (tennis shoes) returned yet?  I returned his call and told him the shoes are not here yet.  He asked me when do we expect them to be here.  I told him we do not have a definite time.  He stated why don't you know a definite time, I'd like an idea.  Will it be a week, 2 weeks or what?  I told him I can't give him a definite time frame, it could be a week or two but I don't know for sure.  It depends on whether or not they are in stock, whether they have your size, or if they are on back order.  I told him we will call when we get them in.  He stated okay thanks.

## 2014-03-13 ENCOUNTER — Telehealth: Payer: Self-pay | Admitting: *Deleted

## 2014-03-13 NOTE — Telephone Encounter (Signed)
Had a pair of shoes returned because my orthotics made them too tight.  What is the status of the shoes?  I look forward to hearing from you today.

## 2014-03-14 ENCOUNTER — Telehealth: Payer: Self-pay | Admitting: *Deleted

## 2014-03-14 NOTE — Telephone Encounter (Signed)
I called yesterday requesting follow-up on my sneakers that were too small.  I'd like to find out where we are with that.  I appreciate someone responding to me concerning my shoes.

## 2014-03-14 NOTE — Telephone Encounter (Signed)
I called and apologized for not calling him back yesterday.  Todd [name] was researching them.  I told him shoes were here.  Need to schedule an appointment to pick them up.  I transferred him to a scheduler.

## 2014-03-18 NOTE — Telephone Encounter (Signed)
Shoes and insoles are in and patient was contacted to schedule an appt for pickup

## 2014-03-21 ENCOUNTER — Encounter: Payer: Self-pay | Admitting: *Deleted

## 2014-03-21 DIAGNOSIS — Q828 Other specified congenital malformations of skin: Secondary | ICD-10-CM

## 2014-03-21 NOTE — Progress Notes (Signed)
   Subjective:    Patient ID: Todd [name], male    DOB: Nov 27, 1955, 58 y.o.   MRN: [id number]  HPI  PUD AND GIVE INSTRUCTION.    Review of Systems     Objective:   Physical Exam        Assessment & Plan:

## 2014-03-22 ENCOUNTER — Ambulatory Visit: Payer: Medicare Other | Admitting: Podiatrist

## 2014-10-18 ENCOUNTER — Encounter: Payer: Self-pay | Admitting: Podiatrist

## 2014-10-18 ENCOUNTER — Ambulatory Visit (INDEPENDENT_AMBULATORY_CARE_PROVIDER_SITE_OTHER): Payer: Medicare Other | Admitting: Podiatrist

## 2014-10-18 DIAGNOSIS — B351 Tinea unguium: Secondary | ICD-10-CM | POA: Diagnosis not present

## 2014-10-18 DIAGNOSIS — Q828 Other specified congenital malformations of skin: Secondary | ICD-10-CM

## 2014-10-18 DIAGNOSIS — E1140 Type 2 diabetes mellitus with diabetic neuropathy, unspecified: Secondary | ICD-10-CM | POA: Diagnosis not present

## 2014-10-18 DIAGNOSIS — M79676 Pain in unspecified toe(s): Secondary | ICD-10-CM | POA: Diagnosis not present

## 2014-10-18 NOTE — Progress Notes (Signed)
Chief Complaint  Patient presents with  . Debridement    Trim toenails  . Callouses    Trim calluses  . Diabetes    Requesting new pair diabetic shoes and insoles      HPI: Patient presents today for follow up of diabetic foot and nail care. Past medical history, meds, and allergies reviewed. Patient states blood sugar is under good  control.   Objective:   Objective:  Patients chart is reviewed.  Vascular status reveals pedal pulses noted at  1 out of 4 dp and pt bilateral .  Neurological sensation is Decreased to [employer] monofilament bilateral at 2/5 sites bilateral.  Dermatological exam reveals pre ulcerative/ hyperkeratotic lesions submetatarsal bilaterally.   Toenails are elongated, incurvated, discolored, dystrophic with ingrown deformity present.    Assessment: Diabetes with Neuropathy, symptomatic mycotic toenails,  porokeratotic lesions bilateral  Plan: Discussed treatment options and alternatives. Debrided nails without complication. The right porokeratotic lesions bilateral Will seek approval for diabetic shoes and have him in for measuring if and when approved.  Discussed importance of foot checks and diabetic shoes recommended for prevention of pressure sores and ulceration.  Return appointment recommended at routine intervals of 3 months.   Trudie Buckler, DPM

## 2014-10-18 NOTE — Patient Instructions (Addendum)
DIABETIC SHOES-  Diabetic shoes and accomidative inserts are indicated for those persons who have Diabetes and who are at an increased for a foot ulceration.  This requires patients to have decreased feeling in the feet, circulation problems, as well as an abnormal foot structure, or a combination of these disorders.  As podiatrists, we can recommend a diabetic shoe to your primary care doctor or endocrinologist HOWEVER, it is up to them to decide if you are eligible or in need of these shoes.  Before we can take measurements or do the ordering of a diabetic shoe and accomidative insert, it is required that we get a signed authorization from your physician both confirming you have Diabetes and that you are at risk for an ulceration.  Please note, if you physician will not sign for the shoes, there is no way we can go around him or her.  You are welcome to place an order for the shoes, however the financial responsibility is up to you and your insurance cannot be billed.   Once, approval is granted for the diabetic shoes and inserts we will make you a separate appointment to be measured, casted, and for you to choose the diabetic shoe.  We will then call you when they are ready for dispensing and you will be seen to make sure the shoes and inserts fit your foot properly.  As this is a multi-step process, it generally takes 4-8 weeks from start to finish.  You cooperation is appreciated.  If it has been more than 6 weeks from the date you ordered your diabetic shoe and you have not heard from our office, please give us a call.      Diabetes and Foot Care Diabetes may cause you to have problems because of poor blood supply (circulation) to your feet and legs. This may cause the skin on your feet to become thinner, break easier, and heal more slowly. Your skin may become dry, and the skin may peel and crack. You may also have nerve damage in your legs and feet causing decreased feeling in them. You may not  notice minor injuries to your feet that could lead to infections or more serious problems. Taking care of your feet is one of the most important things you can do for yourself.  HOME CARE INSTRUCTIONS  Wear shoes at all times, even in the house. Do not go barefoot. Bare feet are easily injured.  Check your feet daily for blisters, cuts, and redness. If you cannot see the bottom of your feet, use a mirror or ask someone for help.  Wash your feet with warm water (do not use hot water) and mild soap. Then pat your feet and the areas between your toes until they are completely dry. Do not soak your feet as this can dry your skin.  Apply a moisturizing lotion or petroleum jelly (that does not contain alcohol and is unscented) to the skin on your feet and to dry, brittle toenails. Do not apply lotion between your toes.  Trim your toenails straight across. Do not dig under them or around the cuticle. File the edges of your nails with an emery board or nail file.  Do not cut corns or calluses or try to remove them with medicine.  Wear clean socks or stockings every day. Make sure they are not too tight. Do not wear knee-high stockings since they may decrease blood flow to your legs.  Wear shoes that fit properly and have enough   cushioning. To break in new shoes, wear them for just a few hours a day. This prevents you from injuring your feet. Always look in your shoes before you put them on to be sure there are no objects inside.  Do not cross your legs. This may decrease the blood flow to your feet.  If you find a minor scrape, cut, or break in the skin on your feet, keep it and the skin around it clean and dry. These areas may be cleansed with mild soap and water. Do not cleanse the area with peroxide, alcohol, or iodine.  When you remove an adhesive bandage, be sure not to damage the skin around it.  If you have a wound, look at it several times a day to make sure it is healing.  Do not use  heating pads or hot water bottles. They may burn your skin. If you have lost feeling in your feet or legs, you may not know it is happening until it is too late.  Make sure your health care provider performs a complete foot exam at least annually or more often if you have foot problems. Report any cuts, sores, or bruises to your health care provider immediately. SEEK MEDICAL CARE IF:   You have an injury that is not healing.  You have cuts or breaks in the skin.  You have an ingrown nail.  You notice redness on your legs or feet.  You feel burning or tingling in your legs or feet.  You have pain or cramps in your legs and feet.  Your legs or feet are numb.  Your feet always feel cold. SEEK IMMEDIATE MEDICAL CARE IF:   There is increasing redness, swelling, or pain in or around a wound.  There is a red line that goes up your leg.  Pus is coming from a wound.  You develop a fever or as directed by your health care provider.  You notice a bad smell coming from an ulcer or wound. Document Released: 07/30/2000 Document Revised: 04/04/2013 Document Reviewed: 01/09/2013 Exit[employer] Patient Information 2015 [employer]. This information is not intended to replace advice given to you by your health care provider. Make sure you discuss any questions you have with your health care provider.  

## 2014-12-31 ENCOUNTER — Ambulatory Visit: Payer: Medicare Other | Admitting: *Deleted

## 2014-12-31 DIAGNOSIS — E1140 Type 2 diabetes mellitus with diabetic neuropathy, unspecified: Secondary | ICD-10-CM

## 2015-02-12 ENCOUNTER — Ambulatory Visit (INDEPENDENT_AMBULATORY_CARE_PROVIDER_SITE_OTHER): Payer: Medicare Other | Admitting: Podiatry

## 2015-02-12 DIAGNOSIS — M2142 Flat foot [pes planus] (acquired), left foot: Secondary | ICD-10-CM

## 2015-02-12 DIAGNOSIS — E1140 Type 2 diabetes mellitus with diabetic neuropathy, unspecified: Secondary | ICD-10-CM

## 2015-02-12 DIAGNOSIS — M2141 Flat foot [pes planus] (acquired), right foot: Secondary | ICD-10-CM

## 2015-02-12 DIAGNOSIS — M216X9 Other acquired deformities of unspecified foot: Secondary | ICD-10-CM

## 2015-02-12 DIAGNOSIS — Q828 Other specified congenital malformations of skin: Secondary | ICD-10-CM

## 2015-02-12 NOTE — Progress Notes (Signed)
Patient ID: Todd [name], male   DOB: Mar 26, 1956, 59 y.o.   MRN: [id number] Patient presents for diabetic shoe fitting, shoes are tried on for good fit.  Patient receives 1 pair Apex LT200M black [place] loafer in 10.5 medium with 3 pairs custom molded diabetic inserts. Written break in and wear instructions given.

## 2015-02-12 NOTE — Patient Instructions (Signed)

## 2015-03-14 ENCOUNTER — Ambulatory Visit (INDEPENDENT_AMBULATORY_CARE_PROVIDER_SITE_OTHER): Payer: Medicare Other | Admitting: Podiatry

## 2015-03-14 ENCOUNTER — Encounter: Payer: Self-pay | Admitting: Podiatry

## 2015-03-14 DIAGNOSIS — Q828 Other specified congenital malformations of skin: Secondary | ICD-10-CM | POA: Diagnosis not present

## 2015-03-14 DIAGNOSIS — B351 Tinea unguium: Secondary | ICD-10-CM | POA: Diagnosis not present

## 2015-03-14 DIAGNOSIS — E1140 Type 2 diabetes mellitus with diabetic neuropathy, unspecified: Secondary | ICD-10-CM | POA: Diagnosis not present

## 2015-03-14 DIAGNOSIS — M79676 Pain in unspecified toe(s): Secondary | ICD-10-CM | POA: Diagnosis not present

## 2015-03-17 NOTE — Progress Notes (Signed)
Patient ID: Todd [name], male   DOB: [birth year], 59 y.o.   MRN: [id number]  Subjective: 59 y.o. returns the office today for painful, elongated, thickened toenails which is difficult for him to trim himself. Denies any redness or drainage around the nails. Also states that he has painful calluses to the bottoms of his feet, which are painful with shoegear. Denies any redness/drainage. Denies any acute changes since last appointment and no new complaints today. Denies any systemic complaints such as fevers, chills, nausea, vomiting.   Objective: AAO 3, NAD DP/PT pulses palpable, CRT less than 3 seconds Protective sensation decreased with [employer] monofilament Nails hypertrophic, dystrophic, elongated, brittle, discolored 10. There is tenderness overlying the nails 1-5 bilaterally. There is no surrounding erythema or drainage along the nail sites. Hyperkeratotic lesion submetatarsal bilaterally 4. Upon debridement there is no underlying ulceration, drainage or other signs of infection. No open lesions or pre-ulcerative lesions are identified. No other areas of tenderness bilateral lower extremities. No overlying edema, erythema, increased warmth. No pain with calf compression, swelling, warmth, erythema.  Assessment: Patient presents with symptomatic onychomycosis; hyperkerotic lesions  Plan: -Treatment options including alternatives, risks, complications were discussed -Nails sharply debrided 10 without complication/bleeding. -Hyperkeratotic lesions sharply debrided 4 without complications splitting. -Discussed daily foot inspection. If there are any changes, to call the office immediately.  -Follow-up in 3 months or sooner if any problems are to arise. In the meantime, encouraged to call the office with any questions, concerns, changes symptoms.   Celesta Gentile, DPM

## 2015-06-20 ENCOUNTER — Encounter: Payer: Self-pay | Admitting: Podiatry

## 2015-06-20 ENCOUNTER — Ambulatory Visit (INDEPENDENT_AMBULATORY_CARE_PROVIDER_SITE_OTHER): Payer: Medicare Other | Admitting: Podiatry

## 2015-06-20 DIAGNOSIS — M79676 Pain in unspecified toe(s): Secondary | ICD-10-CM

## 2015-06-20 DIAGNOSIS — B351 Tinea unguium: Secondary | ICD-10-CM | POA: Diagnosis not present

## 2015-06-20 DIAGNOSIS — E1140 Type 2 diabetes mellitus with diabetic neuropathy, unspecified: Secondary | ICD-10-CM | POA: Diagnosis not present

## 2015-06-20 DIAGNOSIS — Q828 Other specified congenital malformations of skin: Secondary | ICD-10-CM | POA: Diagnosis not present

## 2015-06-20 DIAGNOSIS — Z794 Long term (current) use of insulin: Secondary | ICD-10-CM

## 2015-06-23 NOTE — Progress Notes (Signed)
Patient ID: Todd [name], male   DOB: [birth year], 59 y.o.   MRN: [id number]  Subjective: 59 y.o. returns the office today for painful, elongated, thickened toenails which is difficult for him to trim himself. Denies any redness or drainage around the nails. Also states that he continues to have painful calluses to his feet, which are painful with shoegear. Denies any redness/drainage. Denies any acute changes since last appointment and no new complaints today. Denies any systemic complaints such as fevers, chills, nausea, vomiting.   Objective: AAO 3, NAD DP/PT pulses palpable, CRT less than 3 seconds Protective sensation decreased with [employer]ms Weinstein monofilament Nails hypertrophic, dystrophic, elongated, brittle, discolored 10. There is tenderness overlying the nails 1-5 bilaterally. There is no surrounding erythema or drainage along the nail sites. Hyperkeratotic lesion submetatarsal bilaterally 4. Upon debridement there is no underlying ulceration, drainage or other signs of infection. No open lesions or pre-ulcerative lesions are identified. No other areas of tenderness bilateral lower extremities. No overlying edema, erythema, increased warmth. No pain with calf compression, swelling, warmth, erythema.  Assessment: Patient presents with symptomatic onychomycosis; hyperkerotic lesions  Plan: -Treatment options including alternatives, risks, complications were discussed -Nails sharply debrided 10 without complication/bleeding. -Hyperkeratotic lesions sharply debrided 4 without complications splitting. -Discussed daily foot inspection. If there are any changes, to call the office immediately.  -Follow-up in 3 months or sooner if any problems are to arise. In the meantime, encouraged to call the office with any questions, concerns, changes symptoms.  Celesta Gentile, DPM

## 2015-07-18 ENCOUNTER — Other Ambulatory Visit: Payer: Self-pay | Admitting: Internal Medicine

## 2015-07-18 DIAGNOSIS — M7989 Other specified soft tissue disorders: Secondary | ICD-10-CM

## 2015-07-18 DIAGNOSIS — B1920 Unspecified viral hepatitis C without hepatic coma: Secondary | ICD-10-CM

## 2015-07-22 ENCOUNTER — Other Ambulatory Visit: Payer: Federal, State, Local not specified - PPO

## 2015-07-22 ENCOUNTER — Other Ambulatory Visit: Payer: Self-pay | Admitting: Internal Medicine

## 2015-07-22 DIAGNOSIS — M7989 Other specified soft tissue disorders: Secondary | ICD-10-CM

## 2015-07-23 ENCOUNTER — Ambulatory Visit
Admission: RE | Admit: 2015-07-23 | Discharge: 2015-07-23 | Disposition: A | Discharge status: Home / Self Care | Payer: Medicare Other | Source: Ambulatory Visit | Attending: Internal Medicine | Admitting: Internal Medicine

## 2015-07-23 ENCOUNTER — Other Ambulatory Visit: Payer: Federal, State, Local not specified - PPO

## 2015-07-23 DIAGNOSIS — M7989 Other specified soft tissue disorders: Secondary | ICD-10-CM

## 2015-07-28 ENCOUNTER — Other Ambulatory Visit: Payer: Federal, State, Local not specified - PPO

## 2015-08-26 ENCOUNTER — Ambulatory Visit: Payer: Medicare Other | Admitting: Podiatry

## 2015-08-26 ENCOUNTER — Ambulatory Visit: Payer: Federal, State, Local not specified - PPO | Admitting: Podiatry

## 2015-09-29 ENCOUNTER — Encounter: Payer: Self-pay | Admitting: Sports Medicine

## 2015-09-29 ENCOUNTER — Ambulatory Visit (INDEPENDENT_AMBULATORY_CARE_PROVIDER_SITE_OTHER): Payer: Medicare Other | Admitting: Sports Medicine

## 2015-09-29 DIAGNOSIS — M79676 Pain in unspecified toe(s): Secondary | ICD-10-CM

## 2015-09-29 DIAGNOSIS — E1140 Type 2 diabetes mellitus with diabetic neuropathy, unspecified: Secondary | ICD-10-CM

## 2015-09-29 DIAGNOSIS — B351 Tinea unguium: Secondary | ICD-10-CM

## 2015-09-29 DIAGNOSIS — Z794 Long term (current) use of insulin: Secondary | ICD-10-CM

## 2015-09-29 DIAGNOSIS — Q828 Other specified congenital malformations of skin: Secondary | ICD-10-CM

## 2015-09-29 MED ORDER — TRAMADOL HCL 50 MG PO TABS: 50.0000 mg | ORAL_TABLET | Freq: Three times a day (TID) | ORAL | Status: DC | PRN

## 2015-09-29 NOTE — Progress Notes (Signed)
Patient ID: Rondy Krupinski, male   DOB: 15-Jun-1956, 60 y.o.   MRN: [id number] Subjective: Eliza Grissinger is a 60 y.o. male patient with history of type 2 diabetes who presents to office today complaining of painful callus long, painful nails  while ambulating in shoes; unable to trim. Patient states that the glucose reading this morning was "good"; no recalled #. Patient denies any new changes in medication or new problems. Patient denies any new cramping, numbness, burning or tingling in the legs.  Patient Active Problem List   Diagnosis Date Noted  . Benign paroxysmal positional vertigo 11/08/2013  . RBBB 06/02/2009  . 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 DM 04/11/2009  . OBESITY 04/11/2009  . DEPRESSION 04/11/2009  . HYPERTENSION 04/11/2009  . DIVERTICULAR DISEASE 04/11/2009  . CHOLECYSTITIS 04/11/2009  . CHEST PAIN 04/11/2009  . ABDOMINAL PAIN 04/11/2009   Current Outpatient Prescriptions on File Prior to Visit  Medication Sig Dispense Refill  . CINNAMON PO Take 1 tablet by mouth daily.    Marland Kitchen losartan (COZAAR) 100 MG tablet Take 100 mg by mouth every morning.     . Menthol, Topical Analgesic, (ICY HOT EX) Apply 1 application topically as needed (muscle pain).    . metFORMIN (GLUCOPHAGE) 1000 MG tablet Take 1,000 mg by mouth 2 (two) times daily with a meal.    . Multiple Vitamin (MULTIVITAMIN WITH MINERALS) TABS tablet Take 1 tablet by mouth daily.    . sitaGLIPtin (JANUVIA) 100 MG tablet Take 100 mg by mouth at bedtime.    Marland Kitchen venlafaxine (EFFEXOR) 75 MG tablet Take 75 mg by mouth every morning.      No current facility-administered medications on file prior to visit.   No Known Allergies   Objective: General: Patient is awake, alert, and oriented x 3 and in no acute distress.  Integument: Skin is warm, dry and supple bilateral. Nails are tender, long, thickened and  dystrophic with subungual debris, consistent with onychomycosis, 1-5 bilateral. No signs of infection. No open  lesions. + callus present bilateral medial hallux and sub met 5 on left, most tender. Remaining integument unremarkable.  Vasculature:  Dorsalis Pedis pulse 2/4 bilateral. Posterior Tibial pulse  1/4 bilateral.  Capillary fill time <4 sec 1-5 bilateral. Scant hair growth to the level of the digits. Temperature gradient within normal limits. No varicosities present bilateral. No edema present bilateral.   Neurology: The patient has loss of protective sensation measured with a 5.07/10g Semmes Weinstein Monofilament at all pedal sites bilateral . Vibratory diminished bilateral with tuning fork. No Babinski sign present bilateral.   Musculoskeletal:Mild asymptomatic hammertoe pedal deformities noted bilateral. Muscular strength 5/5 in all lower extremity muscular groups bilateral without pain or limitation on range of motion . No tenderness with calf compression bilateral.  Assessment and Plan: Problem List Items Addressed This Visit    None    Visit Diagnoses    Type 2 diabetes mellitus with diabetic neuropathy, with long-term current use of insulin (HCC)    -  Primary    Porokeratosis        Dermatophytosis of nail        Pain of toe, unspecified laterality        Relevant Medications    traMADol (ULTRAM) 50 MG tablet      -Examined patient. -Discussed and educated patient on diabetic foot care, especially with  regards to the vascular, neurological and musculoskeletal systems.  -Stressed the importance of good glycemic control and the detriment of not  controlling glucose levels in relation to the foot. -Mechanically debrided callus using sterile chisel blade and all nails 1-5 bilateral using sterile nail nipper and filed with dremel without incident  -[employer] diabetic shoe order form was completed; office to contact primary care for approval / certification;  Office to arrange shoe fitting and dispensing. -Recommend Okeeffe's healthy feet cream daily for dry skin -Answered all  patient questions -Patient to return as needed or in 3 months for at risk foot care -Patient advised to call the office if any problems or questions arise in the meantime.  Landis Martins, DPM

## 2015-10-02 ENCOUNTER — Ambulatory Visit
Admission: RE | Admit: 2015-10-02 | Discharge: 2015-10-02 | Disposition: A | Discharge status: Home / Self Care | Payer: Medicare Other | Source: Ambulatory Visit | Attending: Internal Medicine | Admitting: Internal Medicine

## 2015-10-02 DIAGNOSIS — B1920 Unspecified viral hepatitis C without hepatic coma: Secondary | ICD-10-CM

## 2015-12-11 ENCOUNTER — Other Ambulatory Visit (HOSPITAL_COMMUNITY): Payer: Self-pay | Admitting: Orthopedic Surgery

## 2015-12-11 ENCOUNTER — Ambulatory Visit (HOSPITAL_COMMUNITY)
Admission: RE | Admit: 2015-12-11 | Discharge: 2015-12-11 | Disposition: A | Discharge status: Home / Self Care | Payer: Medicare Other | Source: Ambulatory Visit | Attending: Orthopedic Surgery | Admitting: Orthopedic Surgery

## 2015-12-11 DIAGNOSIS — M79662 Pain in left lower leg: Secondary | ICD-10-CM

## 2015-12-11 DIAGNOSIS — M7989 Other specified soft tissue disorders: Principal | ICD-10-CM

## 2015-12-11 DIAGNOSIS — M79605 Pain in left leg: Secondary | ICD-10-CM | POA: Insufficient documentation

## 2015-12-11 DIAGNOSIS — I8392 Asymptomatic varicose veins of left lower extremity: Secondary | ICD-10-CM | POA: Diagnosis not present

## 2015-12-11 NOTE — Progress Notes (Signed)
*  PRELIMINARY RESULTS* Vascular Ultrasound Left lower extremity venous duplex has been completed.  Preliminary findings: No evidence of DVT or baker's cyst. Varicose veins noted at left posterior calf.   Called results to [place].   Landry Mellow, RDMS, RVT  12/11/2015, 2:21 PM

## 2015-12-17 ENCOUNTER — Ambulatory Visit: Payer: Medicare Other

## 2015-12-24 ENCOUNTER — Ambulatory Visit: Payer: Medicare Other

## 2015-12-29 ENCOUNTER — Encounter: Payer: Self-pay | Admitting: Sports Medicine

## 2015-12-29 ENCOUNTER — Ambulatory Visit (INDEPENDENT_AMBULATORY_CARE_PROVIDER_SITE_OTHER): Payer: Medicare Other | Admitting: Sports Medicine

## 2015-12-29 DIAGNOSIS — B351 Tinea unguium: Secondary | ICD-10-CM

## 2015-12-29 DIAGNOSIS — M7752 Other enthesopathy of left foot: Secondary | ICD-10-CM

## 2015-12-29 DIAGNOSIS — Q828 Other specified congenital malformations of skin: Secondary | ICD-10-CM

## 2015-12-29 DIAGNOSIS — M79676 Pain in unspecified toe(s): Secondary | ICD-10-CM

## 2015-12-29 DIAGNOSIS — M778 Other enthesopathies, not elsewhere classified: Secondary | ICD-10-CM

## 2015-12-29 DIAGNOSIS — M779 Enthesopathy, unspecified: Secondary | ICD-10-CM

## 2015-12-29 DIAGNOSIS — E1140 Type 2 diabetes mellitus with diabetic neuropathy, unspecified: Secondary | ICD-10-CM

## 2015-12-29 DIAGNOSIS — M79675 Pain in left toe(s): Secondary | ICD-10-CM | POA: Diagnosis not present

## 2015-12-29 DIAGNOSIS — Z794 Long term (current) use of insulin: Secondary | ICD-10-CM

## 2015-12-29 MED ORDER — TRIAMCINOLONE ACETONIDE 10 MG/ML IJ SUSP: 10.0000 mg | Freq: Once | INTRAMUSCULAR | Status: DC

## 2015-12-29 MED ORDER — TRAMADOL HCL 50 MG PO TABS: 50.0000 mg | ORAL_TABLET | Freq: Three times a day (TID) | ORAL | Status: DC | PRN

## 2015-12-29 NOTE — Progress Notes (Signed)
Patient ID: Todd [name], male   DOB: [birth year], 60 y.o.   MRN: [id number]  Subjective: Todd [name] is a 60 y.o. male patient with history of type 2 diabetes who presents to office today complaining of painful callus long, painful nails  while ambulating in shoes; unable to trim. Patient reports that his left foot is hurting a little more; states that the glucose reading this morning was "good"; unable to give FBS #. Patient denies any new changes in medication or new problems. Patient denies any new cramping, numbness, burning or tingling in the legs.  Patient Active Problem List   Diagnosis Date Noted  . Benign paroxysmal positional vertigo 11/08/2013  . RBBB 06/02/2009  . 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 DM 04/11/2009  . OBESITY 04/11/2009  . DEPRESSION 04/11/2009  . HYPERTENSION 04/11/2009  . DIVERTICULAR DISEASE 04/11/2009  . CHOLECYSTITIS 04/11/2009  . CHEST PAIN 04/11/2009  . ABDOMINAL PAIN 04/11/2009   Current Outpatient Prescriptions on File Prior to Visit  Medication Sig Dispense Refill  . CINNAMON PO Take 1 tablet by mouth daily.    Marland Kitchen losartan (COZAAR) 100 MG tablet Take 100 mg by mouth every morning.     . Menthol, Topical Analgesic, (ICY HOT EX) Apply 1 application topically as needed (muscle pain).    . metFORMIN (GLUCOPHAGE) 1000 MG tablet Take 1,000 mg by mouth 2 (two) times daily with a meal.    . Multiple Vitamin (MULTIVITAMIN WITH MINERALS) TABS tablet Take 1 tablet by mouth daily.    . sitaGLIPtin (JANUVIA) 100 MG tablet Take 100 mg by mouth at bedtime.    Marland Kitchen venlafaxine (EFFEXOR) 75 MG tablet Take 75 mg by mouth every morning.      No current facility-administered medications on file prior to visit.   No Known Allergies   Objective: General: Patient is awake, alert, and oriented x 3 and in no acute distress.  Integument: Skin is warm, dry and supple bilateral. Nails are tender, long, thickened and  dystrophic with subungual debris, consistent with  onychomycosis, 1-5 bilateral. No signs of infection. No open lesions. + callus present bilateral medial hallux and sub met 5 on left, most tender. Remaining integument unremarkable.  Vasculature:  Dorsalis Pedis pulse 2/4 bilateral. Posterior Tibial pulse  1/4 bilateral.  Capillary fill time <4 sec 1-5 bilateral. Scant hair growth to the level of the digits. Temperature gradient within normal limits. No varicosities present bilateral. No edema present bilateral.   Neurology: The patient has loss of protective sensation measured with a 5.07/10g Semmes Weinstein Monofilament at all pedal sites bilateral . Vibratory diminished bilateral with tuning fork. No Babinski sign present bilateral.   Musculoskeletal:Mild tenderness to Left 5th MTPJ, asymptomatic hammertoe pedal deformities noted bilateral. Muscular strength 5/5 in all lower extremity muscular groups bilateral without pain or limitation on range of motion. No tenderness with calf compression bilateral.  Assessment and Plan: Problem List Items Addressed This Visit    None    Visit Diagnoses    Type 2 diabetes mellitus with diabetic neuropathy, with long-term current use of insulin ([place])    -  Primary    Relevant Orders    Fungus Culture with Smear    Porokeratosis        Relevant Orders    Fungus Culture with Smear    Dermatophytosis of nail        Relevant Orders    Fungus Culture with Smear    Pain of toe, unspecified laterality  Relevant Medications    traMADol (ULTRAM) 50 MG tablet    Other Relevant Orders    Fungus Culture with Smear    Capsulitis of foot, left        5th MTPJ    Relevant Medications    traMADol (ULTRAM) 50 MG tablet    triamcinolone acetonide (KENALOG) 10 MG/ML injection 10 mg      -Examined patient. -Discussed and educated patient on diabetic foot care, especially with  regards to the vascular, neurological and musculoskeletal systems.  -Stressed the importance of good glycemic control and the  detriment of not  controlling glucose levels in relation to the foot. -After oral consent and aseptic prep, injected a mixture containing 1 ml of 2%  plain lidocaine, 1 ml 0.5% plain marcaine, 0.5 ml of kenalog 10 and 0.5 ml of dexamethasone phosphate into Left 5th MTPJ without complication. Post-injection care discussed with patient.  -Mechanically debrided callus using sterile chisel blade and all nails 1-5 bilateral using sterile nail nipper and filed with dremel without incident. Fungal culture obtained from both hallux nails and sent to [facility].  -Patient was measured for Diabetic shoes at today's visit  -Recommend Okeeffe's healthy feet cream daily for dry skin -Answered all patient questions -Patient to return as needed or in 3 months for at risk foot care and discussion of fungal results -Patient advised to call the office if any problems or questions arise in the meantime.  Landis Martins, DPM

## 2015-12-30 ENCOUNTER — Ambulatory Visit: Payer: Medicare Other | Admitting: *Deleted

## 2016-01-16 ENCOUNTER — Ambulatory Visit (INDEPENDENT_AMBULATORY_CARE_PROVIDER_SITE_OTHER): Payer: Medicare Other | Admitting: Podiatry

## 2016-01-16 ENCOUNTER — Other Ambulatory Visit: Payer: Self-pay

## 2016-01-16 DIAGNOSIS — L84 Corns and callosities: Secondary | ICD-10-CM | POA: Diagnosis not present

## 2016-01-16 DIAGNOSIS — M2041 Other hammer toe(s) (acquired), right foot: Secondary | ICD-10-CM | POA: Diagnosis not present

## 2016-01-16 DIAGNOSIS — M2141 Flat foot [pes planus] (acquired), right foot: Secondary | ICD-10-CM | POA: Diagnosis not present

## 2016-01-16 DIAGNOSIS — M2040 Other hammer toe(s) (acquired), unspecified foot: Secondary | ICD-10-CM

## 2016-01-16 DIAGNOSIS — M79676 Pain in unspecified toe(s): Secondary | ICD-10-CM

## 2016-01-16 DIAGNOSIS — M2042 Other hammer toe(s) (acquired), left foot: Secondary | ICD-10-CM

## 2016-01-16 DIAGNOSIS — M2142 Flat foot [pes planus] (acquired), left foot: Secondary | ICD-10-CM | POA: Diagnosis not present

## 2016-01-16 DIAGNOSIS — E1140 Type 2 diabetes mellitus with diabetic neuropathy, unspecified: Secondary | ICD-10-CM

## 2016-01-16 DIAGNOSIS — Z794 Long term (current) use of insulin: Secondary | ICD-10-CM

## 2016-01-16 MED ORDER — TRAMADOL HCL 50 MG PO TABS: 50.0000 mg | ORAL_TABLET | Freq: Three times a day (TID) | ORAL | Status: DC | PRN

## 2016-01-16 NOTE — Progress Notes (Signed)
Patient ID: Todd [name], male   DOB: January 26, 1956, 60 y.o.   MRN: [id number] Patient presents for diabetic shoe pick up, shoes are tried on for good fit.  Patient received 1 Pair Apex A1100M Classic lace boat shoe brown in men's size 11 wide and 3 pairs custom molded diabetic inserts.  Verbal and written break in and wear instructions given.  Patient will follow up for scheduled routine care.

## 2016-01-16 NOTE — Patient Instructions (Signed)

## 2016-01-19 ENCOUNTER — Emergency Department (HOSPITAL_BASED_OUTPATIENT_CLINIC_OR_DEPARTMENT_OTHER)
Admit: 2016-01-19 | Discharge: 2016-01-19 | Disposition: A | Discharge status: Home / Self Care | Payer: Medicare Other | Attending: Emergency Medicine | Admitting: Emergency Medicine

## 2016-01-19 ENCOUNTER — Encounter (HOSPITAL_COMMUNITY): Payer: Self-pay | Admitting: *Deleted

## 2016-01-19 ENCOUNTER — Emergency Department (HOSPITAL_COMMUNITY): Payer: Medicare Other

## 2016-01-19 ENCOUNTER — Emergency Department (HOSPITAL_COMMUNITY)
Admission: EM | Admit: 2016-01-19 | Discharge: 2016-01-19 | Disposition: A | Discharge status: Home / Self Care | Payer: Medicare Other | Attending: Emergency Medicine | Admitting: Emergency Medicine

## 2016-01-19 DIAGNOSIS — M25562 Pain in left knee: Secondary | ICD-10-CM | POA: Diagnosis present

## 2016-01-19 DIAGNOSIS — Z87891 Personal history of nicotine dependence: Secondary | ICD-10-CM | POA: Insufficient documentation

## 2016-01-19 DIAGNOSIS — M1712 Unilateral primary osteoarthritis, left knee: Secondary | ICD-10-CM | POA: Diagnosis not present

## 2016-01-19 DIAGNOSIS — Z96651 Presence of right artificial knee joint: Secondary | ICD-10-CM | POA: Diagnosis not present

## 2016-01-19 DIAGNOSIS — E119 Type 2 diabetes mellitus without complications: Secondary | ICD-10-CM | POA: Diagnosis not present

## 2016-01-19 DIAGNOSIS — I10 Essential (primary) hypertension: Secondary | ICD-10-CM | POA: Diagnosis not present

## 2016-01-19 DIAGNOSIS — Z86718 Personal history of other venous thrombosis and embolism: Secondary | ICD-10-CM | POA: Diagnosis not present

## 2016-01-19 DIAGNOSIS — Z8546 Personal history of malignant neoplasm of prostate: Secondary | ICD-10-CM | POA: Insufficient documentation

## 2016-01-19 DIAGNOSIS — Z7984 Long term (current) use of oral hypoglycemic drugs: Secondary | ICD-10-CM | POA: Insufficient documentation

## 2016-01-19 LAB — BASIC METABOLIC PANEL
Anion gap: 6 (ref 5–15)
BUN: 16 mg/dL (ref 6–20)
CHLORIDE: 105 mmol/L (ref 101–111)
CO2: 26 mmol/L (ref 22–32)
Calcium: 9.5 mg/dL (ref 8.9–10.3)
Creatinine, Ser: 0.77 mg/dL (ref 0.61–1.24)
GFR calc Af Amer: >60 mL/min (ref >60)
GFR calc non Af Amer: >60 mL/min (ref >60)
Glucose, Bld: 126 mg/dL — ABNORMAL HIGH (ref 65–99)
POTASSIUM: 4.3 mmol/L (ref 3.5–5.1)
SODIUM: 137 mmol/L (ref 135–145)

## 2016-01-19 LAB — CBC WITH DIFFERENTIAL/PLATELET
Basophils Absolute: 0 10*3/uL (ref 0.0–0.1)
Basophils Relative: 0 %
EOS ABS: 0.2 10*3/uL (ref 0.0–0.7)
Eosinophils Relative: 3 %
HEMATOCRIT: 38.6 % — AB (ref 39.0–52.0)
HEMOGLOBIN: 13 g/dL (ref 13.0–17.0)
LYMPHS ABS: 2.1 10*3/uL (ref 0.7–4.0)
LYMPHS PCT: 35 %
MCH: 30 pg (ref 26.0–34.0)
MCHC: 33.7 g/dL (ref 30.0–36.0)
MCV: 88.9 fL (ref 78.0–100.0)
Monocytes Absolute: 0.4 10*3/uL (ref 0.1–1.0)
Monocytes Relative: 6 %
NEUTROS ABS: 3.3 10*3/uL (ref 1.7–7.7)
NEUTROS PCT: 56 %
Platelets: 229 10*3/uL (ref 150–400)
RBC: 4.34 MIL/uL (ref 4.22–5.81)
RDW: 12.9 % (ref 11.5–15.5)
WBC: 6 10*3/uL (ref 4.0–10.5)

## 2016-01-19 MED ORDER — OXYCODONE-ACETAMINOPHEN 5-325 MG PO TABS
1.0000 | ORAL_TABLET | Freq: Once | ORAL | Status: AC
  Administered 2016-01-19: 1 via ORAL
  Filled 2016-01-19: qty 1

## 2016-01-19 MED ORDER — OXYCODONE-ACETAMINOPHEN 5-325 MG PO TABS: 1.0000 | ORAL_TABLET | Freq: Four times a day (QID) | ORAL | Status: DC | PRN

## 2016-01-19 MED FILL — OXYCODONE/APAP 5/325 MG TAB: 5-325 | 1 days supply | Qty: 12 | Fill #0

## 2016-01-19 NOTE — ED Notes (Signed)
Pt is resting comfortably with eyes closed.  Symmetrical rise and fall of chest noted.

## 2016-01-19 NOTE — Discharge Instructions (Signed)
Your x-ray does show some arthritis of the left knee that is causing her symptoms. Her ultrasound does not show blood clots, mass, or cysts in the leg. I you have been given referral to orthopedic surgery for follow-up regarding further management of the arthritis pain in the knee. Return for worsening symptoms, including worsening redness or swelling, fever, or any other symptoms concerning to you.  Heat Therapy Heat therapy can help ease sore, stiff, injured, and tight muscles and joints. Heat relaxes your muscles, which may help ease your pain. Heat therapy should only be used on old, pre-existing, or long-lasting (chronic) injuries. Do not use heat therapy unless told by your doctor. HOW TO USE HEAT THERAPY There are several different kinds of heat therapy, including:  Moist heat pack.  Warm water bath.  Hot water bottle.  Electric heating pad.  Heated gel pack.  Heated wrap.  Electric heating pad. GENERAL HEAT THERAPY RECOMMENDATIONS   Do not sleep while using heat therapy. Only use heat therapy while you are awake.  Your skin may turn pink while using heat therapy. Do not use heat therapy if your skin turns red.  Do not use heat therapy if you have new pain.  High heat or long exposure to heat can cause burns. Be careful when using heat therapy to avoid burning your skin.  Do not use heat therapy on areas of your skin that are already irritated, such as with a rash or sunburn. GET HELP IF:   You have blisters, redness, swelling (puffiness), or numbness.  You have new pain.  Your pain is worse. MAKE SURE YOU:  Understand these instructions.  Will watch your condition.  Will get help right away if you are not doing well or get worse.   This information is not intended to replace advice given to you by your health care provider. Make sure you discuss any questions you have with your health care provider.   Document Released: 10/25/2011 Document Revised: 08/23/2014  Document Reviewed: 09/25/2013 Elsevier Interactive Patient Education 2016 Elsevier Inc.  Joint Pain Joint pain can be caused by many things. The joint can be bruised, infected, weak from aging, or sore from exercise. The pain will probably go away if you follow your doctor's instructions for home care. If your joint pain continues, more tests may be needed to help find the cause of your condition. HOME CARE Watch your condition for any changes. Follow these instructions as told to lessen the pain that you are feeling:  Take medicines only as told by your doctor.  Rest the sore joint for as long as told by your doctor. If your doctor tells you to, raise (elevate) the painful joint above the level of your heart while you are sitting or lying down.  Do not do things that cause pain or make the pain worse.  If told, put ice on the painful area:  Put ice in a plastic bag.  Place a towel between your skin and the bag.  Leave the ice on for 20 minutes, 2-3 times per day.  Wear an elastic bandage, splint, or sling as told by your doctor. Loosen the bandage or splint if your fingers or toes lose feeling (become numb) and tingle, or if they turn cold and blue.  Begin exercising or stretching the joint as told by your doctor. Ask your doctor what types of exercise are safe for you.  Keep all follow-up visits as told by your doctor. This is important. GET  HELP IF:  Your pain gets worse and medicine does not help it.  Your joint pain does not get better in 3 days.  You have more bruising or swelling.  You have a fever.  You lose 10 pounds (4.5 kg) or more without trying. GET HELP RIGHT AWAY IF:  You are not able to move the joint.  Your fingers or toes become numb or they turn cold and blue.   This information is not intended to replace advice given to you by your health care provider. Make sure you discuss any questions you have with your health care provider.   Document Released:  07/21/2009 Document Revised: 08/23/2014 Document Reviewed: 05/14/2014 Elsevier Interactive Patient Education [employer].

## 2016-01-19 NOTE — ED Provider Notes (Signed)
CSN: [id number]     Arrival date & time 01/19/16  [id number] History   First MD Initiated Contact with Patient 01/19/16 [phone]     Chief Complaint  Patient presents with  . Knee Pain     (Consider location/radiation/quality/duration/timing/severity/associated sxs/prior Treatment) HPI 60 year old male who presents with left knee pain. States several week history of left knee pain and left knee swelling. History of diabetes, are still arthritis of the right knee status post right knee replacement, hypertension. Denies any associating trauma, fevers, chills, night sweats. Has been increasingly having difficulty ambulating. Denies numbness or weakness. 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 No shortness of breath, chest pain, cough. Past Medical History  Diagnosis Date  . Diabetes mellitus   . Arthritis   . Hypertension   . Sleep apnea     not currently using cpap, mask causing vertigo  . 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 Hepatitis     hepatitis c, finished harvoni tx 3 months ago   Past Surgical History  Procedure Laterality Date  . Prostate surgery    . Knee surgery Right     QUADS  . Colonoscopy with propofol N/A 02/19/2014    Procedure: COLONOSCOPY WITH PROPOFOL;  Surgeon: Garlan Fair, MD;  Location: [facility];  Service: Endoscopy;  Laterality: N/A;   Family History  Problem Relation Age of Onset  . Cancer Father     PROSTATE   Social History  Substance Use Topics  . Smoking status: Former Smoker    Quit date: 02/20/2012  . Smokeless tobacco: Never Used     Comment: social smoking only  . Alcohol Use: No    Review of Systems 10/14 systems reviewed and are negative other than those stated in the HPI    Allergies  Review of patient's allergies indicates no known allergies.  Home Medications   Prior to Admission medications   Medication Sig Start Date End Date Taking?  Authorizing Provider  Liniments (SALONPAS PAIN RELIEF PATCH EX) Apply 3-4 patches topically daily.   Yes Historical Provider, MD  losartan (COZAAR) 100 MG tablet Take 100 mg by mouth every morning.    Yes Historical Provider, MD  Menthol, Topical Analgesic, (ICY HOT EX) Apply 1 application topically as needed (for knee pain).   Yes Historical Provider, MD  metFORMIN (GLUCOPHAGE) 1000 MG tablet Take 1,000 mg by mouth 2 (two) times daily with a meal.   Yes Historical Provider, MD  Multiple Vitamin (MULTIVITAMIN WITH MINERALS) TABS tablet Take 1 tablet by mouth daily.   Yes Historical Provider, MD  sitaGLIPtin (JANUVIA) 100 MG tablet Take 100 mg by mouth at bedtime.   Yes Historical Provider, MD  traMADol (ULTRAM) 50 MG tablet Take 1 tablet (50 mg total) by mouth every 8 (eight) hours as needed. Patient taking differently: Take 50 mg by mouth every 8 (eight) hours as needed for moderate pain.  01/16/16  Yes Wallene Huh, DPM  venlafaxine (EFFEXOR) 75 MG tablet Take 75 mg by mouth every morning.    Yes Historical Provider, MD  oxyCODONE-acetaminophen (ROXICET) 5-325 MG tablet Take 1-2 tablets by mouth every 6 (six) hours as needed for moderate pain or severe pain. 01/19/16   Forde Dandy, MD   BP 143/92 mmHg  Pulse 83  Temp(Src) 98.5 F (36.9 C) (Oral)  Resp 20  SpO2 94% Physical Exam Physical Exam  Nursing  note and vitals reviewed. Constitutional: Well developed, well nourished, non-toxic, and in no acute distress Head: Normocephalic and atraumatic.  Mouth/Throat: Oropharynx is clear and moist.  Neck: Normal range of motion. Neck supple.  Cardiovascular: Normal rate and regular rhythm.   Pulmonary/Chest: Effort normal and breath sounds normal.  Abdominal: Soft. There is no tenderness. There is no rebound and no guarding.  Musculoskeletal: Limited range of motion of the left knee due to pain. Mild soft tissue swelling effusion of the left knee without overlying warmth or erythema. Tender to  palpation on medial and lateral aspects of the left knee.  Neurological: Alert, no facial droop, fluent speech, moves all extremities symmetrically Sensation to light touch intact throughout bilateral lower extremities and full strength ankle dorsi and plantar flexion.  Skin: Skin is warm and dry.  Psychiatric: Cooperative  ED Course  Procedures (including critical care time) Labs Review Labs Reviewed  CBC WITH DIFFERENTIAL/PLATELET - Abnormal; Notable for the following:    HCT 38.6 (*)    All other components within normal limits  BASIC METABOLIC PANEL - Abnormal; Notable for the following:    Glucose, Bld 126 (*)    All other components within normal limits    Imaging Review Dg Knee Complete 4 Views Left  01/19/2016  CLINICAL DATA:  Worsening left knee pain over last 2 weeks. EXAM: LEFT KNEE - COMPLETE 4+ VIEW COMPARISON:  None. FINDINGS: Tricompartment degenerative changes with joint space narrowing and spurring. Small joint effusion. No acute bony abnormality. Specifically, no fracture, subluxation, or dislocation. Soft tissues are intact. IMPRESSION: Mild tricompartment degenerative changes. No acute bony abnormality. Electronically Signed   By: Rolm Baptise M.D.   On: 01/19/2016 09:04   I have personally reviewed and evaluated these images and lab results as part of my medical decision-making.   EKG Interpretation None      MDM   Final diagnoses:  Left knee pain  Primary osteoarthritis of left knee    60 year old male with history of diabetes and osteoarthritis who presents with progressive left knee pain. On presentation is well-appearing in no acute distress. Vital signs overall not concerning. He does have mildly swollen left knee without significant overlying erythema or warmth. No systemic signs or symptoms of illness, and exam does not seem consistent with that of septic arthritis. X-ray of the knee does reveal mild effusion with tricompartmental arthritis which is  likely the etiology of his symptoms. Ultrasound of his lower extremity does not reveal DVT, Baker's cyst, mass or other acute processes. At this time, I do not suspect serious or emergent conditions causing his left knee pain. He is given referral for orthopedic surgery regarding further management of his arthritis as needed. He will follow up closely with his primary care doctor. Strict return instructions are reviewed. He expressed understanding of all discharge instructions, and felt comfortable with the plan of care    Forde Dandy, MD 01/19/16 1127

## 2016-01-19 NOTE — ED Notes (Signed)
Verbalized understanding discharge instructions and follow-up. In no acute distress.   

## 2016-01-19 NOTE — Progress Notes (Addendum)
VASCULAR LAB PRELIMINARY  PRELIMINARY  PRELIMINARY  PRELIMINARY  Left lower extremity venous duplex completed.    Preliminary report:  Left:  No evidence of DVT, superficial thrombosis, or Baker's cyst. 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 Patient had history of DVT. Gave result to charge nurse@10 :50 am.  Janifer Adie, RVT, RDMS 01/19/2016, 10:57 AM

## 2016-01-19 NOTE — ED Notes (Signed)
See triage note.

## 2016-01-19 NOTE — ED Notes (Signed)
Pt reports L knee pain and swelling x 2 weeks.  Denies any injury at this time.  Swelling noted.

## 2016-02-23 ENCOUNTER — Ambulatory Visit (INDEPENDENT_AMBULATORY_CARE_PROVIDER_SITE_OTHER): Payer: Medicare Other

## 2016-02-23 ENCOUNTER — Ambulatory Visit (INDEPENDENT_AMBULATORY_CARE_PROVIDER_SITE_OTHER): Payer: Medicare Other | Admitting: Podiatry

## 2016-02-23 ENCOUNTER — Encounter: Payer: Self-pay | Admitting: Podiatry

## 2016-02-23 VITALS — BP 152/89 | HR 71 | Resp 16

## 2016-02-23 DIAGNOSIS — M779 Enthesopathy, unspecified: Secondary | ICD-10-CM

## 2016-02-23 DIAGNOSIS — M79672 Pain in left foot: Secondary | ICD-10-CM

## 2016-02-23 DIAGNOSIS — M7752 Other enthesopathy of left foot: Secondary | ICD-10-CM | POA: Diagnosis not present

## 2016-02-23 DIAGNOSIS — M778 Other enthesopathies, not elsewhere classified: Secondary | ICD-10-CM

## 2016-02-23 DIAGNOSIS — E1140 Type 2 diabetes mellitus with diabetic neuropathy, unspecified: Secondary | ICD-10-CM

## 2016-02-23 DIAGNOSIS — Q828 Other specified congenital malformations of skin: Secondary | ICD-10-CM

## 2016-02-23 MED ORDER — TRIAMCINOLONE ACETONIDE 10 MG/ML IJ SUSP
10.0000 mg | Freq: Once | INTRAMUSCULAR | Status: AC
  Administered 2016-02-23: 10 mg

## 2016-02-24 NOTE — Progress Notes (Signed)
Subjective:     Patient ID: Todd [name], male   DOB: 07/24/1956, 60 y.o.   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 We will see him back when symptoms recur  X-rays indicate that it is around the head of the fifth metatarsal left

## 2016-04-05 ENCOUNTER — Ambulatory Visit (INDEPENDENT_AMBULATORY_CARE_PROVIDER_SITE_OTHER): Payer: Medicare Other | Admitting: Sports Medicine

## 2016-04-05 ENCOUNTER — Encounter: Payer: Self-pay | Admitting: Sports Medicine

## 2016-04-05 DIAGNOSIS — B351 Tinea unguium: Secondary | ICD-10-CM | POA: Diagnosis not present

## 2016-04-05 DIAGNOSIS — Q828 Other specified congenital malformations of skin: Secondary | ICD-10-CM

## 2016-04-05 DIAGNOSIS — E1140 Type 2 diabetes mellitus with diabetic neuropathy, unspecified: Secondary | ICD-10-CM | POA: Diagnosis not present

## 2016-04-05 DIAGNOSIS — M79676 Pain in unspecified toe(s): Secondary | ICD-10-CM

## 2016-04-05 NOTE — Progress Notes (Signed)
Patient ID: Joshawa Dubin, male   DOB: 07/02/1956, 60 y.o.   MRN: [id number]  Subjective: Valente Fosberg is a 60 y.o. male patient with history of type 2 diabetes who presents to office today complaining of painful callus long, painful nails  while ambulating in shoes; unable to trim. Patient is also here for fungal culture results; states that the glucose reading this morning was "good"; unable to give FBS #. Patient denies any new changes in medication or new problems. Patient denies any new cramping, numbness, burning or tingling in the legs.  Patient Active Problem List   Diagnosis Date Noted  . Benign paroxysmal positional vertigo 11/08/2013  . RBBB 06/02/2009  . 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 DM 04/11/2009  . OBESITY 04/11/2009  . DEPRESSION 04/11/2009  . HYPERTENSION 04/11/2009  . DIVERTICULAR DISEASE 04/11/2009  . CHOLECYSTITIS 04/11/2009  . CHEST PAIN 04/11/2009  . ABDOMINAL PAIN 04/11/2009   Current Outpatient Prescriptions on File Prior to Visit  Medication Sig Dispense Refill  . Liniments (SALONPAS PAIN RELIEF PATCH EX) Apply 3-4 patches topically daily.    Marland Kitchen losartan (COZAAR) 100 MG tablet Take 100 mg by mouth every morning.     . Menthol, Topical Analgesic, (ICY HOT EX) Apply 1 application topically as needed (for knee pain).    . metFORMIN (GLUCOPHAGE) 1000 MG tablet Take 1,000 mg by mouth 2 (two) times daily with a meal.    . Multiple Vitamin (MULTIVITAMIN WITH MINERALS) TABS tablet Take 1 tablet by mouth daily.    Marland Kitchen oxyCODONE-acetaminophen (ROXICET) 5-325 MG tablet Take 1-2 tablets by mouth every 6 (six) hours as needed for moderate pain or severe pain. 12 tablet 0  . sitaGLIPtin (JANUVIA) 100 MG tablet Take 100 mg by mouth at bedtime.    . traMADol (ULTRAM) 50 MG tablet Take 1 tablet (50 mg total) by mouth every 8 (eight) hours as needed. (Patient taking differently: Take 50 mg by mouth every 8 (eight) hours as needed for moderate pain. ) 30 tablet 0  .  venlafaxine (EFFEXOR) 75 MG tablet Take 75 mg by mouth every morning.      Current Facility-Administered Medications on File Prior to Visit  Medication Dose Route Frequency Provider Last Rate Last Dose  . triamcinolone acetonide (KENALOG) 10 MG/ML injection 10 mg  10 mg Other Once [employer], DPM       No Known Allergies   Objective: General: Patient is awake, alert, and oriented x 3 and in no acute distress.  Integument: Skin is warm, dry and supple bilateral. Nails are tender, long, thickened and dystrophic with subungual debris, consistent with onychomycosis, 1-5 bilateral. No signs of infection. No open lesions. + callus present bilateral medial hallux and sub met 5 on left. Remaining integument unremarkable.  Vasculature:  Dorsalis Pedis pulse 2/4 bilateral. Posterior Tibial pulse  1/4 bilateral. Capillary fill time <4 sec 1-5 bilateral. Scant hair growth to the level of the digits.Temperature gradient within normal limits. No varicosities present bilateral. No edema present bilateral.   Neurology: The patient has loss of protective sensation measured with a 5.07/10g Semmes Weinstein Monofilament at all pedal sites bilateral . Vibratory diminished bilateral with tuning fork. No Babinski sign present bilateral.   Musculoskeletal:NO tenderness to Left 5th MTPJ, asymptomatic hammertoe pedal deformities noted bilateral. Muscular strength 5/5 in all lower extremity muscular groups bilateral without pain or limitation on range of motion. No tenderness with calf compression bilateral.  Assessment and Plan: Problem List Items Addressed This Visit  None    Visit Diagnoses    Dermatophytosis of nail    -  Primary   Relevant Orders   Hepatic Function Panel   Porokeratosis       Pain of toe, unspecified laterality       Type 2 diabetes mellitus with diabetic neuropathy, unspecified long term insulin use status ([place])         -Examined patient. -Discussed and educated patient on  diabetic foot care, especially with  regards to the vascular, neurological and musculoskeletal systems.  -Stressed the importance of good glycemic control and the detriment of not  controlling glucose levels in relation to the foot.  -Mechanically debrided callus using sterile chisel blade and all nails 1-5 bilateral using sterile nail nipper and filed with dremel without incident. Fungal culture + T. Rubrum and sent LTFs for review for consideration for PO lamisil; To call patient with results  -Recommend Okeeffe's healthy feet cream daily for dry skin -Answered all patient questions -Patient brought in his diabetic shoes for return for smaller size; Met with Melody today -Patient to return as needed or in 6 weeks for med check after starting Lamisil in LFTs are normal. -Patient advised to call the office if any problems or questions arise in the meantime.  Landis Martins, DPM

## 2016-05-18 ENCOUNTER — Ambulatory Visit: Payer: Medicare Other | Admitting: Sports Medicine

## 2016-06-01 ENCOUNTER — Encounter: Payer: Self-pay | Admitting: Sports Medicine

## 2016-06-01 ENCOUNTER — Ambulatory Visit (INDEPENDENT_AMBULATORY_CARE_PROVIDER_SITE_OTHER): Payer: Medicare Other | Admitting: Sports Medicine

## 2016-06-01 DIAGNOSIS — Q828 Other specified congenital malformations of skin: Secondary | ICD-10-CM | POA: Diagnosis not present

## 2016-06-01 DIAGNOSIS — E1140 Type 2 diabetes mellitus with diabetic neuropathy, unspecified: Secondary | ICD-10-CM

## 2016-06-01 DIAGNOSIS — B359 Dermatophytosis, unspecified: Secondary | ICD-10-CM

## 2016-06-01 DIAGNOSIS — M79676 Pain in unspecified toe(s): Secondary | ICD-10-CM

## 2016-06-01 DIAGNOSIS — B351 Tinea unguium: Secondary | ICD-10-CM

## 2016-06-01 NOTE — Progress Notes (Signed)
Patient ID: Todd [name], male   DOB: 04-Jan-1956, 60 y.o.   MRN: [id number]  Subjective: Todd [name] is a 60 y.o. male patient with history of type 2 diabetes who presents to office today complaining of painful callus long, painful nails  while ambulating in shoes; unable to trim. Patient  states that the glucose reading this morning was "good"; unable to give FBS #. Patient denies any new changes in medication or new problems. Patient denies any new cramping, numbness, burning or tingling in the legs.  Patient is also here to pick up his re-ordered diabetic shoes; sent back last pair for a new size.   Patient Active Problem List   Diagnosis Date Noted  . Benign paroxysmal positional vertigo 11/08/2013  . RBBB 06/02/2009  . 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 DM 04/11/2009  . OBESITY 04/11/2009  . DEPRESSION 04/11/2009  . HYPERTENSION 04/11/2009  . DIVERTICULAR DISEASE 04/11/2009  . CHOLECYSTITIS 04/11/2009  . CHEST PAIN 04/11/2009  . ABDOMINAL PAIN 04/11/2009   Current Outpatient Prescriptions on File Prior to Visit  Medication Sig Dispense Refill  . Liniments (SALONPAS PAIN RELIEF PATCH EX) Apply 3-4 patches topically daily.    Marland Kitchen losartan (COZAAR) 100 MG tablet Take 100 mg by mouth every morning.     . Menthol, Topical Analgesic, (ICY HOT EX) Apply 1 application topically as needed (for knee pain).    . metFORMIN (GLUCOPHAGE) 1000 MG tablet Take 1,000 mg by mouth 2 (two) times daily with a meal.    . Multiple Vitamin (MULTIVITAMIN WITH MINERALS) TABS tablet Take 1 tablet by mouth daily.    Marland Kitchen oxyCODONE-acetaminophen (ROXICET) 5-325 MG tablet Take 1-2 tablets by mouth every 6 (six) hours as needed for moderate pain or severe pain. 12 tablet 0  . sitaGLIPtin (JANUVIA) 100 MG tablet Take 100 mg by mouth at bedtime.    . traMADol (ULTRAM) 50 MG tablet Take 1 tablet (50 mg total) by mouth every 8 (eight) hours as needed. (Patient taking differently: Take 50 mg by mouth every 8 (eight)  hours as needed for moderate pain. ) 30 tablet 0  . venlafaxine (EFFEXOR) 75 MG tablet Take 75 mg by mouth every morning.      Current Facility-Administered Medications on File Prior to Visit  Medication Dose Route Frequency Provider Last Rate Last Dose  . triamcinolone acetonide (KENALOG) 10 MG/ML injection 10 mg  10 mg Other Once [employer], DPM       No Known Allergies   Objective: General: Patient is awake, alert, and oriented x 3 and in no acute distress.  Integument: Skin is warm, dry and supple bilateral. Nails are tender, long, thickened and dystrophic with subungual debris, consistent with onychomycosis, 1-5 bilateral. No signs of infection. No open lesions. + callus present bilateral medial hallux and sub met 5 on left. Remaining integument unremarkable.  Vasculature:  Dorsalis Pedis pulse 2/4 bilateral. Posterior Tibial pulse  1/4 bilateral. Capillary fill time <4 sec 1-5 bilateral. Scant hair growth to the level of the digits.Temperature gradient within normal limits. No varicosities present bilateral. No edema present bilateral.   Neurology: The patient has loss of protective sensation measured with a 5.07/10g Semmes Weinstein Monofilament at all pedal sites bilateral . Vibratory diminished bilateral with tuning fork. No Babinski sign present bilateral.   Musculoskeletal:No tenderness to Left 5th MTPJ, asymptomatic hammertoe pedal deformities noted bilateral. Muscular strength 5/5 in all lower extremity muscular groups bilateral without pain or limitation on range of motion. No tenderness with  calf compression bilateral.  Assessment and Plan: Problem List Items Addressed This Visit    None    Visit Diagnoses    Tinea    -  Primary   Relevant Orders   Hepatic Function Panel   Dermatophytosis of nail       Porokeratosis       Pain of toe, unspecified laterality       Type 2 diabetes mellitus with diabetic neuropathy, unspecified long term insulin use status ([place])          -Examined patient. -Discussed and educated patient on diabetic foot care, especially with  regards to the vascular, neurological and musculoskeletal systems.  -Stressed the importance of good glycemic control and the detriment of not  controlling glucose levels in relation to the foot.  -Mechanically debrided callus using sterile chisel blade and all nails 1-5 bilateral using sterile nail nipper and filed with dremel without incident. Fungal culture + T. Rubrum and re-ordered LTFs for review for consideration for PO lamisil; To call patient with results  -Recommend continue with Okeeffe's healthy feet cream daily for dry skin -Answered all patient questions -Patient picked up his  Re-ordered diabetic shoes at today's visit -Patient to return as needed or in 6 weeks for med check after starting Lamisil in LFTs are normal. -Patient advised to call the office if any problems or questions arise in the meantime.  Landis Martins, DPM

## 2016-06-17 ENCOUNTER — Ambulatory Visit (INDEPENDENT_AMBULATORY_CARE_PROVIDER_SITE_OTHER): Payer: Medicare Other | Admitting: Orthopedic Surgery

## 2016-06-23 ENCOUNTER — Encounter (INDEPENDENT_AMBULATORY_CARE_PROVIDER_SITE_OTHER): Payer: Self-pay | Admitting: Orthopedic Surgery

## 2016-06-23 ENCOUNTER — Ambulatory Visit (INDEPENDENT_AMBULATORY_CARE_PROVIDER_SITE_OTHER): Payer: Medicare Other | Admitting: Orthopedic Surgery

## 2016-06-23 VITALS — Ht 67.0 in | Wt 266.0 lb

## 2016-06-23 DIAGNOSIS — M170 Bilateral primary osteoarthritis of knee: Secondary | ICD-10-CM | POA: Diagnosis not present

## 2016-06-23 MED ORDER — METHYLPREDNISOLONE ACETATE 40 MG/ML IJ SUSP
40.0000 mg | INTRAMUSCULAR | Status: AC | PRN
  Administered 2016-06-23: 40 mg via INTRA_ARTICULAR

## 2016-06-23 MED ORDER — LIDOCAINE HCL 1 % IJ SOLN
5.0000 mL | INTRAMUSCULAR | Status: AC | PRN
  Administered 2016-06-23: 5 mL

## 2016-06-23 NOTE — Progress Notes (Signed)
Office Visit Note   Patient: Todd [name]           Date of Birth: 05/20/1956           MRN: [id number] Visit Date: 06/23/2016              Requested by: [name], MD [address] [place], [place] [address] PCP: Kandice Hams, MD   Assessment & Plan: Visit Diagnoses:  1. Bilateral primary osteoarthritis of knee     Plan: Follow-up in the office in 3 weeks. We will draw a uric acid level to see if patient does have a gout component to the arthritic pain in both knees. He had 30 mL of clear synovial fluid aspirated from the left knee no signs of any tophaceous deposits in the fluid. Both knees show radiographs with a tricompartmental arthritis of both knees status post extensor mechanism reconstruction on the right. Left knee was aspirated both knees were injected with 1 mL Depo-Medrol 40 mg 5 mL 1% lidocaine plain.  Follow-Up Instructions: Return in about 3 weeks (around 07/14/2016).   Orders:  Orders Placed This Encounter  Procedures  . Uric acid   No orders of the defined types were placed in this encounter.     Procedures: Large Joint Inj Date/Time: 06/23/2016 4:21 PM Performed by: Tahisha Hakim V Authorized by: Newt Minion   Consent Given by:  Patient Site marked: the procedure site was marked   Timeout: prior to procedure the correct patient, procedure, and site was verified   Indications:  Pain and diagnostic evaluation Location:  Knee Site:  L knee Needle Size:  22 G Needle Length:  1.5 inches Approach:  Superolateral Ultrasound Guidance: No   Fluoroscopic Guidance: No   Arthrogram: No Medications:  5 mL lidocaine 1 %; 40 mg methylPREDNISolone acetate 40 MG/ML Aspiration Attempted: Yes   Aspirate amount (mL):  30 Aspirate:  Clear Patient tolerance:  Patient tolerated the procedure well with no immediate complications Large Joint Inj Date/Time: 06/23/2016 4:22 PM Performed by: Muhanad Torosyan V Authorized by: Newt Minion   Consent  Given by:  Patient Site marked: the procedure site was marked   Timeout: prior to procedure the correct patient, procedure, and site was verified   Indications:  Pain and diagnostic evaluation Location:  Knee Site:  R knee Needle Size:  22 G Needle Length:  1.5 inches Approach:  Anterolateral Ultrasound Guidance: No   Fluoroscopic Guidance: No   Arthrogram: No Medications:  5 mL lidocaine 1 %; 40 mg methylPREDNISolone acetate 40 MG/ML Aspiration Attempted: No   Patient tolerance:  Patient tolerated the procedure well with no immediate complications     Clinical Data: No additional findings.   Subjective: Chief Complaint  Patient presents with  . Left Knee - Pain  . Right Knee - Pain    05/13/16 s/p aspiration and injection of the left knee and injection of the right knee. Patient states that this has not been helpful " at all"  He states that he rather stand than sit in the exam room. That the weather has been affecting his knee making them more painful.    Review of Systems   Objective: Vital Signs: Ht 5\' 7"  (1.702 m)   Wt 266 lb (120.7 kg)   BMI 41.66 kg/m   Physical Exam Patient is alert oriented no adenopathy well-dressed normal affect normal respiratory effort.  Patient does have an antalgic gait he has a tense effusion the  left knee no effusion of the right knee. Radiographs are reviewed which show tricompartmental arthritic changes. He has crepitation range of motion of both knees pain with flexion both knees Clausen cruciates are stable extensor mechanism is intact.  Ortho Exam  Specialty Comments:  No specialty comments available.  Imaging: No results found.   PMFS History: Patient Active Problem List   Diagnosis Date Noted  . Benign paroxysmal positional vertigo 11/08/2013  . RBBB 06/02/2009  . 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 DM 04/11/2009  . OBESITY 04/11/2009  . DEPRESSION 04/11/2009  . HYPERTENSION 04/11/2009  . DIVERTICULAR DISEASE  04/11/2009  . CHOLECYSTITIS 04/11/2009  . CHEST PAIN 04/11/2009  . ABDOMINAL PAIN 04/11/2009   Past Medical History:  Diagnosis Date  . Arthritis   . 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 Diabetes mellitus   . Hepatitis    hepatitis c, finished harvoni tx 3 months ago  . Hypertension   . Sleep apnea    not currently using cpap, mask causing vertigo    Family History  Problem Relation Age of Onset  . Cancer Father     PROSTATE    Past Surgical History:  Procedure Laterality Date  . COLONOSCOPY WITH PROPOFOL N/A 02/19/2014   Procedure: COLONOSCOPY WITH PROPOFOL;  Surgeon: Garlan Fair, MD;  Location: [facility];  Service: Endoscopy;  Laterality: N/A;  . KNEE SURGERY Right    QUADS  . PROSTATE SURGERY     Social History   Occupational History  . post office    Social History Main Topics  . Smoking status: Former Smoker    Quit date: 02/20/2012  . Smokeless tobacco: Never Used     Comment: social smoking only  . Alcohol use No  . Drug use: No  . Sexual activity: Not on file

## 2016-06-24 LAB — URIC ACID: URIC ACID, SERUM: 6.4 mg/dL (ref 4.0–8.0)

## 2016-07-13 ENCOUNTER — Encounter: Payer: Self-pay | Admitting: Sports Medicine

## 2016-07-13 ENCOUNTER — Ambulatory Visit (INDEPENDENT_AMBULATORY_CARE_PROVIDER_SITE_OTHER): Payer: Medicare Other | Admitting: Sports Medicine

## 2016-07-13 DIAGNOSIS — M79676 Pain in unspecified toe(s): Secondary | ICD-10-CM | POA: Diagnosis not present

## 2016-07-13 DIAGNOSIS — B351 Tinea unguium: Secondary | ICD-10-CM

## 2016-07-13 DIAGNOSIS — B359 Dermatophytosis, unspecified: Secondary | ICD-10-CM

## 2016-07-13 DIAGNOSIS — E1140 Type 2 diabetes mellitus with diabetic neuropathy, unspecified: Secondary | ICD-10-CM | POA: Diagnosis not present

## 2016-07-13 DIAGNOSIS — Q828 Other specified congenital malformations of skin: Secondary | ICD-10-CM

## 2016-07-13 NOTE — Progress Notes (Signed)
Patient ID: Todd [name], male   DOB: 08/18/1955, 60 y.o.   MRN: [id number]  Subjective: Macguire [name] is a 60 y.o. male patient with history of type 2 diabetes who presents to office today complaining of painful callus long, painful nails  while ambulating in shoes; unable to trim. Patient  states that the glucose reading this morning was "good"; unable to give FBS # as usual. Patient denies any new changes in medication or new problems. Patient denies any new cramping, numbness, burning or tingling in the legs.  Patient also states that he still hasnt gotten his LFTs yet.  Patient Active Problem List   Diagnosis Date Noted  . Benign paroxysmal positional vertigo 11/08/2013  . RBBB 06/02/2009  . 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 DM 04/11/2009  . OBESITY 04/11/2009  . DEPRESSION 04/11/2009  . HYPERTENSION 04/11/2009  . DIVERTICULAR DISEASE 04/11/2009  . CHOLECYSTITIS 04/11/2009  . CHEST PAIN 04/11/2009  . ABDOMINAL PAIN 04/11/2009   Current Outpatient Prescriptions on File Prior to Visit  Medication Sig Dispense Refill  . Liniments (SALONPAS PAIN RELIEF PATCH EX) Apply 3-4 patches topically daily.    Marland Kitchen losartan (COZAAR) 100 MG tablet Take 100 mg by mouth every morning.     . Menthol, Topical Analgesic, (ICY HOT EX) Apply 1 application topically as needed (for knee pain).    . metFORMIN (GLUCOPHAGE) 1000 MG tablet Take 1,000 mg by mouth 2 (two) times daily with a meal.    . Multiple Vitamin (MULTIVITAMIN WITH MINERALS) TABS tablet Take 1 tablet by mouth daily.    Marland Kitchen oxyCODONE-acetaminophen (ROXICET) 5-325 MG tablet Take 1-2 tablets by mouth every 6 (six) hours as needed for moderate pain or severe pain. (Patient not taking: Reported on 06/23/2016) 12 tablet 0  . sitaGLIPtin (JANUVIA) 100 MG tablet Take 100 mg by mouth at bedtime.    . traMADol (ULTRAM) 50 MG tablet Take 1 tablet (50 mg total) by mouth every 8 (eight) hours as needed. (Patient not taking: Reported on 06/23/2016) 30  tablet 0  . venlafaxine (EFFEXOR) 75 MG tablet Take 75 mg by mouth every morning.      Current Facility-Administered Medications on File Prior to Visit  Medication Dose Route Frequency Provider Last Rate Last Dose  . triamcinolone acetonide (KENALOG) 10 MG/ML injection 10 mg  10 mg Other Once [employer], DPM       No Known Allergies   Objective: General: Patient is awake, alert, and oriented x 3 and in no acute distress.  Integument: Skin is warm, dry and supple bilateral. Nails are tender, long, thickened and dystrophic with subungual debris, consistent with onychomycosis, 1-5 bilateral. No signs of infection. No open lesions. + callus present bilateral medial hallux and sub met 5 on left. Remaining integument unremarkable.  Vasculature:  Dorsalis Pedis pulse 2/4 bilateral. Posterior Tibial pulse  1/4 bilateral. Capillary fill time <4 sec 1-5 bilateral. Scant hair growth to the level of the digits.Temperature gradient within normal limits. No varicosities present bilateral. No edema present bilateral.   Neurology: The patient has loss of protective sensation measured with a 5.07/10g Semmes Weinstein Monofilament at all pedal sites bilateral . Vibratory diminished bilateral with tuning fork. No Babinski sign present bilateral.   Musculoskeletal:No tenderness to Left 5th MTPJ, asymptomatic hammertoe pedal deformities noted bilateral. Muscular strength 5/5 in all lower extremity muscular groups bilateral without pain or limitation on range of motion. No tenderness with calf compression bilateral.  Assessment and Plan: Problem List Items Addressed This Visit  None    Visit Diagnoses    Dermatophytosis of nail    -  Primary   Relevant Orders   Hepatic Function Panel   Porokeratosis       Tinea       Pain of toe, unspecified laterality       Type 2 diabetes mellitus with diabetic neuropathy, unspecified long term insulin use status ([place])         -Examined patient. -Discussed and  educated patient on diabetic foot care, especially with regards to the vascular, neurological and musculoskeletal systems.  -Stressed the importance of good glycemic control and the detriment of not controlling glucose levels in relation to the foot.  -Mechanically debrided callus skin using rotary bur and all nails 1-5 bilateral using sterile nail nipper and filed with dremel without incident. Fungal culture + T. Rubrum and re-ordered LTFs for review for consideration for PO lamisil; To call patient with results  -Recommend continue with Okeeffe's healthy feet cream daily for dry skin -Answered all patient questions -Patient to return as needed or in 6 weeks for med check after starting Lamisil if LFTs are normal. -Patient advised to call the office if any problems or questions arise in the meantime.  Landis Martins, DPM

## 2016-08-24 ENCOUNTER — Ambulatory Visit: Payer: Medicare Other | Admitting: Sports Medicine

## 2016-11-11 ENCOUNTER — Telehealth: Payer: Self-pay | Admitting: Sports Medicine

## 2016-11-11 NOTE — Telephone Encounter (Signed)
Pt would like something for pain.

## 2016-11-11 NOTE — Telephone Encounter (Signed)
He would have to see his PCP or go to ER for pain -Dr. Cannon Kettle

## 2016-11-12 NOTE — Telephone Encounter (Signed)
I informed pt of Dr. Leeanne Rio recommendation, and pt states understanding. Pt asked for confirmation of his next appt. I informed 11/23/2016 at 10:15am.

## 2016-11-18 ENCOUNTER — Encounter: Payer: Self-pay | Admitting: Vascular Surgery

## 2016-11-23 ENCOUNTER — Ambulatory Visit: Payer: Medicare Other | Admitting: Sports Medicine

## 2016-11-30 ENCOUNTER — Ambulatory Visit: Payer: Medicare Other | Admitting: Sports Medicine

## 2016-11-30 ENCOUNTER — Encounter: Payer: Medicare Other | Admitting: Vascular Surgery

## 2017-03-22 ENCOUNTER — Emergency Department (HOSPITAL_COMMUNITY): Payer: Medicare Other

## 2017-03-22 ENCOUNTER — Encounter (HOSPITAL_COMMUNITY): Payer: Self-pay | Admitting: Emergency Medicine

## 2017-03-22 ENCOUNTER — Emergency Department (HOSPITAL_COMMUNITY)
Admission: EM | Admit: 2017-03-22 | Discharge: 2017-03-22 | Disposition: A | Discharge status: Home / Self Care | Payer: Medicare Other | Attending: Emergency Medicine | Admitting: Emergency Medicine

## 2017-03-22 DIAGNOSIS — F17200 Nicotine dependence, unspecified, uncomplicated: Secondary | ICD-10-CM | POA: Insufficient documentation

## 2017-03-22 DIAGNOSIS — Y929 Unspecified place or not applicable: Secondary | ICD-10-CM | POA: Insufficient documentation

## 2017-03-22 DIAGNOSIS — Y999 Unspecified external cause status: Secondary | ICD-10-CM | POA: Insufficient documentation

## 2017-03-22 DIAGNOSIS — I10 Essential (primary) hypertension: Secondary | ICD-10-CM | POA: Diagnosis not present

## 2017-03-22 DIAGNOSIS — Z7984 Long term (current) use of oral hypoglycemic drugs: Secondary | ICD-10-CM | POA: Diagnosis not present

## 2017-03-22 DIAGNOSIS — S2241XA Multiple fractures of ribs, right side, initial encounter for closed fracture: Secondary | ICD-10-CM | POA: Insufficient documentation

## 2017-03-22 DIAGNOSIS — I77819 Aortic ectasia, unspecified site: Secondary | ICD-10-CM

## 2017-03-22 DIAGNOSIS — Z79899 Other long term (current) drug therapy: Secondary | ICD-10-CM | POA: Diagnosis not present

## 2017-03-22 DIAGNOSIS — E119 Type 2 diabetes mellitus without complications: Secondary | ICD-10-CM | POA: Insufficient documentation

## 2017-03-22 DIAGNOSIS — Y939 Activity, unspecified: Secondary | ICD-10-CM | POA: Diagnosis not present

## 2017-03-22 DIAGNOSIS — W1849XA Other slipping, tripping and stumbling without falling, initial encounter: Secondary | ICD-10-CM | POA: Diagnosis not present

## 2017-03-22 DIAGNOSIS — S299XXA Unspecified injury of thorax, initial encounter: Secondary | ICD-10-CM | POA: Diagnosis present

## 2017-03-22 LAB — CBC
HEMATOCRIT: 42.5 % (ref 39.0–52.0)
HEMOGLOBIN: 14.1 g/dL (ref 13.0–17.0)
MCH: 29.9 pg (ref 26.0–34.0)
MCHC: 33.2 g/dL (ref 30.0–36.0)
MCV: 90 fL (ref 78.0–100.0)
Platelets: 239 10*3/uL (ref 150–400)
RBC: 4.72 MIL/uL (ref 4.22–5.81)
RDW: 13.1 % (ref 11.5–15.5)
WBC: 6.8 10*3/uL (ref 4.0–10.5)

## 2017-03-22 LAB — I-STAT CHEM 8, ED
BUN: 16 mg/dL (ref 6–20)
CALCIUM ION: 1.16 mmol/L (ref 1.15–1.40)
Chloride: 100 mmol/L — ABNORMAL LOW (ref 101–111)
Creatinine, Ser: 0.8 mg/dL (ref 0.61–1.24)
GLUCOSE: 105 mg/dL — AB (ref 65–99)
HCT: 46 % (ref 39.0–52.0)
HEMOGLOBIN: 15.6 g/dL (ref 13.0–17.0)
POTASSIUM: 4.4 mmol/L (ref 3.5–5.1)
Sodium: 139 mmol/L (ref 135–145)
TCO2: 28 mmol/L (ref 0–100)

## 2017-03-22 LAB — I-STAT TROPONIN, ED: TROPONIN I, POC: 0.01 ng/mL (ref 0.00–0.08)

## 2017-03-22 LAB — COMPREHENSIVE METABOLIC PANEL
ALK PHOS: 67 U/L (ref 38–126)
ALT: 14 U/L — AB (ref 17–63)
AST: 21 U/L (ref 15–41)
Albumin: 4 g/dL (ref 3.5–5.0)
Anion gap: 9 (ref 5–15)
BILIRUBIN TOTAL: 0.3 mg/dL (ref 0.3–1.2)
BUN: 16 mg/dL (ref 6–20)
CO2: 28 mmol/L (ref 22–32)
CREATININE: 0.83 mg/dL (ref 0.61–1.24)
Calcium: 9.9 mg/dL (ref 8.9–10.3)
Chloride: 102 mmol/L (ref 101–111)
GFR calc Af Amer: >60 mL/min (ref >60)
GLUCOSE: 109 mg/dL — AB (ref 65–99)
POTASSIUM: 4.5 mmol/L (ref 3.5–5.1)
Sodium: 139 mmol/L (ref 135–145)
TOTAL PROTEIN: 8 g/dL (ref 6.5–8.1)

## 2017-03-22 LAB — LIPASE, BLOOD: LIPASE: 18 U/L (ref 11–51)

## 2017-03-22 MED ORDER — CYCLOBENZAPRINE HCL 10 MG PO TABS: 10.0000 mg | ORAL_TABLET | Freq: Every evening | ORAL | 0 refills | Status: DC | PRN

## 2017-03-22 MED ORDER — KETOROLAC TROMETHAMINE 30 MG/ML IJ SOLN
15.0000 mg | Freq: Once | INTRAMUSCULAR | Status: AC
  Administered 2017-03-22: 15 mg via INTRAVENOUS
  Filled 2017-03-22: qty 1

## 2017-03-22 MED ORDER — IOPAMIDOL (ISOVUE-370) INJECTION 76%
100.0000 mL | Freq: Once | INTRAVENOUS | Status: AC | PRN
  Administered 2017-03-22: 100 mL via INTRAVENOUS

## 2017-03-22 MED ORDER — MORPHINE SULFATE (PF) 4 MG/ML IV SOLN
4.0000 mg | Freq: Once | INTRAVENOUS | Status: AC
  Administered 2017-03-22: 4 mg via INTRAVENOUS
  Filled 2017-03-22: qty 1

## 2017-03-22 MED ORDER — IOPAMIDOL (ISOVUE-370) INJECTION 76%
INTRAVENOUS | Status: AC
  Filled 2017-03-22: qty 100

## 2017-03-22 MED ORDER — LEVOFLOXACIN 500 MG PO TABS: 500.0000 mg | ORAL_TABLET | Freq: Every day | ORAL | 0 refills | Status: DC

## 2017-03-22 MED ORDER — HYDROCODONE-ACETAMINOPHEN 5-325 MG PO TABS: 1.0000 | ORAL_TABLET | Freq: Four times a day (QID) | ORAL | 0 refills | Status: DC | PRN

## 2017-03-22 NOTE — ED Notes (Signed)
Incentive Spirometry instructed and demonstrated in return.

## 2017-03-22 NOTE — ED Triage Notes (Signed)
Pt reports having chest pain that has been ongoing for the last week and radiation into abd and into back. Pt reports feeling pressure like pain.

## 2017-03-22 NOTE — ED Provider Notes (Signed)
[facility] DEPT Provider Note   CSN: [id number] Arrival date & time: 03/22/17  0705     History   Chief Complaint Chief Complaint  Patient presents with  . Chest Pain    HPI Todd [name] is a 61 y.o. male w PMHx HTN, HLD, prostate cancer, hepatitis C, RBBB, presenting with gradually worsening central and right-sided chest pain, as well as abdominal pain x2 weeks. Pt states he tripped on the sidewalk 2 weeks ago and has been having gradually worsening pain since. States chest pain is achy radiating to back, worse with breathing, movement, and laying on right side. States abd pain is epigastric and achy, also worse with movement and breathing. Denies N/V, diaphoresis, urinary symptoms, bowel changes, HA, SOB, vision changes, or any other symptoms today.  The history is provided by the patient and the spouse.    Past Medical History:  Diagnosis Date  . Arthritis   . Cancer ([place]) 52yrs ago   PROSTATE  . Diabetes mellitus   . Hepatitis    hepatitis c, finished harvoni tx 3 months ago  . Hypertension   . Sleep apnea    not currently using cpap, mask causing vertigo    Patient Active Problem List   Diagnosis Date Noted  . Benign paroxysmal positional vertigo 11/08/2013  . RBBB 06/02/2009  . 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 DM 04/11/2009  . OBESITY 04/11/2009  . DEPRESSION 04/11/2009  . HYPERTENSION 04/11/2009  . DIVERTICULAR DISEASE 04/11/2009  . CHOLECYSTITIS 04/11/2009  . CHEST PAIN 04/11/2009  . ABDOMINAL PAIN 04/11/2009    Past Surgical History:  Procedure Laterality Date  . COLONOSCOPY WITH PROPOFOL N/A 02/19/2014   Procedure: COLONOSCOPY WITH PROPOFOL;  Surgeon: Garlan Fair, MD;  Location: [facility] ENDOSCOPY;  Service: Endoscopy;  Laterality: N/A;  . KNEE SURGERY Right    QUADS  . PROSTATE SURGERY         Home Medications    Prior to Admission medications   Medication Sig Start Date End Date Taking? Authorizing Provider  aspirin EC 81 MG tablet  Take 81 mg by mouth daily.   Yes [provider]  losartan (COZAAR) 25 MG tablet Take 25 mg by mouth daily.   Yes [provider]  metFORMIN (GLUCOPHAGE) 1000 MG tablet Take 1,000 mg by mouth 2 (two) times daily with a meal.   Yes [provider]  Multiple Vitamin (MULTIVITAMIN WITH MINERALS) TABS tablet Take 1 tablet by mouth daily.   Yes [provider]  rosuvastatin (CRESTOR) 5 MG tablet Take 5 mg by mouth daily.   Yes [provider]  venlafaxine (EFFEXOR) 75 MG tablet Take 75 mg by mouth every morning.    Yes [provider]  cyclobenzaprine (FLEXERIL) 10 MG tablet Take 1 tablet (10 mg total) by mouth at bedtime as needed for muscle spasms. 03/22/17   Russo, [place] N, PA-C  HYDROcodone-acetaminophen (NORCO/VICODIN) 5-325 MG tablet Take 1-2 tablets by mouth every 6 (six) hours as needed for severe pain. 03/22/17   Russo, [place] N, PA-C  levofloxacin (LEVAQUIN) 500 MG tablet Take 1 tablet (500 mg total) by mouth daily. 03/22/17   Russo, [place] N, PA-C    Family History Family History  Problem Relation Age of Onset  . Cancer Father        PROSTATE    Social History Social History  Substance Use Topics  . Smoking status: Current Some Day Smoker    Packs/day: 0.50  . Smokeless tobacco: Never Used  Comment: social smoking only  . Alcohol use No     Allergies   Patient has no known allergies.   Review of Systems Review of Systems  Constitutional: Negative for diaphoresis and fever.  HENT: Negative.   Eyes: Negative for visual disturbance.  Respiratory: Negative for shortness of breath.   Cardiovascular:       Pleuritic CP  Gastrointestinal: Positive for abdominal pain (epigastric). Negative for constipation, diarrhea, nausea and vomiting.  Genitourinary: Negative for decreased urine volume, dysuria and frequency.  Musculoskeletal: Positive for back pain.  Skin: Negative for color change.  Neurological: Negative for syncope  and headaches.  Hematological: Does not bruise/bleed easily.     Physical Exam Updated Vital Signs BP (!) 146/98 (BP Location: Left Arm)   Pulse 74   Temp 98.4 F (36.9 C) (Oral)   Resp 14   Ht 5\' 9"  (1.753 m)   Wt 108.9 kg (240 lb)   SpO2 95%   BMI 35.44 kg/m   Physical Exam  Constitutional: He appears well-developed and well-nourished.  Pt appears uncomfortable on initial eval, pt rocking on his side. Morbidly obese  HENT:  Head: Normocephalic and atraumatic.  Eyes: Conjunctivae are normal.  Neck: Normal range of motion. Neck supple. No JVD present. No tracheal deviation present.  Cardiovascular: Normal rate, regular rhythm, normal heart sounds and intact distal pulses.  Exam reveals no friction rub.   No murmur heard. Pulmonary/Chest: Effort normal. No stridor. No respiratory distress. He has no wheezes. He has no rales. He exhibits tenderness (anterior TTP and TTP to light touch of right later ribs.).  Rhonchi b/l lung fields  Abdominal: Soft. Bowel sounds are normal. There is tenderness in the right upper quadrant, epigastric area and periumbilical area.  Abdominal exam difficult to assess 2/t patient's body habitus.  Musculoskeletal: Normal range of motion.  No crepitus over chest wall, no ecchymosis or gross deformities.  Neurological: He is alert.  Skin: Skin is warm.  Psychiatric: He has a normal mood and affect. His behavior is normal.  Nursing note and vitals reviewed.    ED Treatments / Results  Labs (all labs ordered are listed, but only abnormal results are displayed) Labs Reviewed  COMPREHENSIVE METABOLIC PANEL - Abnormal; Notable for the following:       Result Value   Glucose, Bld 109 (*)    ALT 14 (*)    All other components within normal limits  I-STAT CHEM 8, ED - Abnormal; Notable for the following:    Chloride 100 (*)    Glucose, Bld 105 (*)    All other components within normal limits  CBC  LIPASE, BLOOD  I-STAT TROPONIN, ED    EKG   EKG Interpretation  Date/Time:  Tuesday March 22 2017 07:12:36 EDT Ventricular Rate:  91 PR Interval:    QRS Duration: 134 QT Interval:  398 QTC Calculation: 490 R Axis:   -86 Text Interpretation:  Sinus rhythm RBBB and LAFB Minimal ST elevation, lateral leads No significant change since last tracing 31 Oct 2013 Confirmed by Rolland Porter [phone]) on 03/22/2017 7:15:45 AM Also confirmed by Rolland Porter [phone]), editor Drema Pry [phone])  on 03/22/2017 9:27:23 AM       Radiology Dg Chest 2 View  Result Date: 03/22/2017 CLINICAL DATA:  Right-sided chest pain for 1 week EXAM: CHEST  2 VIEW COMPARISON:  09/04/2014 FINDINGS: Cardiac shadow is at the upper limits of normal in size. The lungs are well aerated bilaterally. No focal infiltrate or  sizable effusion is seen. Bilateral nipple shadows are noted. No acute bony abnormality is seen. IMPRESSION: No acute abnormality noted. Electronically Signed   By: Inez Catalina M.D.   On: 03/22/2017 07:42   Ct Angio Chest/abd/pel For Dissection W And/or Wo Contrast  Result Date: 03/22/2017 CLINICAL DATA:  Chest pressure and chest pain for 2 weeks, painful to lie flat, chest pain radiating to abdomen and back, pressure-like pain, history of hypertension, diabetes mellitus, prostate cancer, smoker EXAM: CT ANGIOGRAPHY CHEST, ABDOMEN AND PELVIS TECHNIQUE: Multidetector CT imaging through the chest, abdomen and pelvis was performed using the standard protocol during bolus administration of intravenous contrast. Multiplanar reconstructed images and MIPs were obtained and reviewed to evaluate the vascular anatomy. Precontrast CT imaging of the chest was also performed. CONTRAST:  100 cc Isovue 370 IV COMPARISON:  CT abdomen and pelvis 10/03/2014, CT chest 09/23/2008 FINDINGS: CTA CHEST FINDINGS Cardiovascular: Minimal coronary arterial and aortic atherosclerotic calcification. No intramural hematoma on precontrast imaging. Mild fusiform aneurysmal dilatation  ascending thoracic aorta 4.0 cm image 41. Normal aortic enhancement without evidence of dissection or para-aortic hemorrhage/infiltration. Pulmonary artery is suboptimally opacified due to targeted aortic technique, grossly unremarkable. No pericardial effusion. Mediastinum/Nodes: Esophagus unremarkable. Visualized base of cervical region normal appearance. Few scattered normal size mediastinal and LEFT hilar lymph nodes. Single mildly enlarged RIGHT hilar node 14 mm short axis image 41 previously 9 mm. No additional thoracic adenopathy. Lungs/Pleura: Scattered interstitial infiltrates in the peripheries of both lungs, new. No segmental consolidation, pleural effusion or pneumothorax. No definite pulmonary nodule. Musculoskeletal: Healing fractures of the anterior RIGHT second through seventh ribs. Scattered degenerative disc disease changes thoracic spine. Review of the MIP images confirms the above findings. CTA ABDOMEN AND PELVIS FINDINGS VASCULAR Aorta: Minimal atherosclerotic calcification. Normal caliber. No para-aortic infiltration. Celiac: Patent, without calcified plaque at origin SMA: Patent, without calcified plaque at origin Renals: Patent, without calcified plaque at origin IMA: Patent, unremarkable Inflow: Unremarkable, patent Veins: Inadequately opacified on arterial phase imaging, suboptimally assessed Review of the MIP images confirms the above findings. NON-VASCULAR Hepatobiliary: Post cholecystectomy. Liver unremarkable. No biliary dilatation. Pancreas: Normal appearance Spleen: Normal appearance Adrenals/Urinary Tract: Adrenal glands normal appearance. Multiple small BILATERAL renal cysts largest mid LEFT kidney 3.2 x 2.7 cm. No definite urinary tract calcification, hydronephrosis or hydroureter. Bladder decompressed though extending low in pelvis due to prior prostatectomy. Stomach/Bowel: Normal appendix. Sigmoid diverticulosis without evidence of diverticulitis. Stomach and bowel loops  otherwise normal appearance. Lymphatic: No adenopathy Reproductive: N/A Other: No free air free fluid.  Prior ventral herniorrhaphy. Musculoskeletal: No acute osseous findings. Review of the MIP images confirms the above findings. IMPRESSION: Scattered peripheral interstitial infiltrates in both lungs, could represent atypical infection, unlikely edema, could also be seen with chronic he eosinophilic pneumonia, nonspecific interstitial pneumonia, and usual interstitial pneumonia/ idiopathic pulmonary fibrosis. BILATERAL renal cysts. Sigmoid diverticulosis. Single nonspecific mildly enlarged RIGHT hilar lymph node. Healing fractures of anterior RIGHT second through seventh ribs. No evidence of aortic dissection. 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 Recommend annual imaging followup by CTA or MRA. This recommendation follows 2010 ACCF/AHA/AATS/ACR/ASA/SCA/SCAI/SIR/STS/SVM Guidelines for the Diagnosis and Management of Patients with Thoracic Aortic Disease. Circulation. 2010; 121: T654-Y503 Electronically Signed   By: Lavonia Dana M.D.   On: 03/22/2017 10:44    Procedures Procedures (including critical care time)  Medications Ordered in ED Medications  morphine 4 MG/ML injection 4 mg (4 mg Intravenous Given 03/22/17 0818)  iopamidol (ISOVUE-370) 76 %  injection 100 mL (100 mLs Intravenous Contrast Given 03/22/17 0950)  morphine 4 MG/ML injection 4 mg (4 mg Intravenous Given 03/22/17 1004)  ketorolac (TORADOL) 30 MG/ML injection 15 mg (15 mg Intravenous Given 03/22/17 1210)     Initial Impression / Assessment and Plan / ED Course  I have reviewed the triage vital signs and the nursing notes.  Pertinent labs & imaging results that were available during my care of the patient were reviewed by me and considered in my medical decision making (see chart for details).     Pt presenting with right-sided rib/chest pain and epigastric abdominal pain with  radiation to his back x2 weeks, s/p mechanical fall. Pt exquisitely on abdl exam, right lateral ribs w tenderness. CXR done, and negative for acute pathology. Given pt's risk factors, CT angio of chest/abd/pelvis done to rule out dissection; results showing healing right rib 2-7 fx, dilated aorta 4cm, scattered peripheral interstitial infiltrates in both lungs. Pt sent with incentive spirometer and symptom management for rib fractures. Levaquin for pulmonary findings. Cardiothoracic surgery referral to follow up on  Aortic dilatation. Discussed other incidental findings of CT with patient and recommended he discuss results with his PCP during follow up for pulmonary findings. Pain managed in ED with improvement. Pt is hemodynamically stable, not in distress, safe for discharge home with PCP and CT surg follow up.  Patient discussed with and seen by Dr. Lita Mains, who agrees with care plan.  Discussed results, findings, treatment and follow up. Patient advised of return precautions. Patient verbalized understanding and agreed with plan.   Final Clinical Impressions(s) / ED Diagnoses   Final diagnoses:  Multiple fractures of ribs, right side, initial encounter for closed fracture  Aortic dilatation [facility])    New Prescriptions Discharge Medication List as of 03/22/2017 11:50 AM    START taking these medications   Details  cyclobenzaprine (FLEXERIL) 10 MG tablet Take 1 tablet (10 mg total) by mouth at bedtime as needed for muscle spasms., Starting Tue 03/22/2017, Print    HYDROcodone-acetaminophen (NORCO/VICODIN) 5-325 MG tablet Take 1-2 tablets by mouth every 6 (six) hours as needed for severe pain., Starting Tue 03/22/2017, Print    levofloxacin (LEVAQUIN) 500 MG tablet Take 1 tablet (500 mg total) by mouth daily., Starting Tue 03/22/2017, Print         Virgina Jock Martin[place] N, PA-C 03/22/17 1710    Julianne Rice, MD 03/27/17 [phone]

## 2017-03-22 NOTE — Discharge Instructions (Signed)
Please read instructions below. You can take Norco every 6 hours as needed for severe pain. Do not drive, drink alcohol, or take Tylenol taking this medication. You can take advil every 6 hours for moderate pain. Take Flexeril as needed for muscle spasm. Use the incentive spirometer, at least 4 times per day. Schedule an appointment with your primary doctor to follow up on your lungs and ribs. Schedule an appointment with the cardiothoracic specialist to follow up on your CT findings about your aorta. It is recommended you get repeat imaging in 1 year to monitor the size of your aorta. Return to the ER for new or concerning symptoms.

## 2017-04-11 ENCOUNTER — Ambulatory Visit (INDEPENDENT_AMBULATORY_CARE_PROVIDER_SITE_OTHER): Payer: Medicare Other | Admitting: Podiatry

## 2017-04-11 ENCOUNTER — Encounter: Payer: Self-pay | Admitting: Podiatry

## 2017-04-11 DIAGNOSIS — M79676 Pain in unspecified toe(s): Secondary | ICD-10-CM

## 2017-04-11 DIAGNOSIS — B351 Tinea unguium: Secondary | ICD-10-CM | POA: Diagnosis not present

## 2017-04-11 DIAGNOSIS — E1140 Type 2 diabetes mellitus with diabetic neuropathy, unspecified: Secondary | ICD-10-CM

## 2017-04-11 NOTE — Progress Notes (Signed)
Patient ID: Todd [name], male   DOB: 11-03-1955, 61 y.o.   MRN: [id number]   Subjective: This patient presents again complaining of thickened and elongated toenails are comfortable walking wearing shoes and request toenail debridement. Patient was last evaluated in your office on 07/13/2016, and describes complicated medical history resulting in lack of podiatric care. He is also requesting diabetic shoes  Objective: Orientated 3 DP pulses 2/4 bilaterally PT pulses 1/4 bilaterally Capillary refill delayed Decreased sensation to 10 g monofilament wire Vibratory sensation diminished bilaterally No open skin lesions bilaterally The toenails are extremely elongated, brittle, deformed, discolored and tender to direct palpation 6-10 Atrophic skin with absent hair growth bilaterally Plantar callus bilaterally Hammertoe second left  Assessment: Diabetic with peripheral neuropathy Decreased posterior tibial pulses bilaterally Neglected symptomatic mycotic toenails 6-10  Plan: Debridement toenails 6-10 mechanically and electrically without any bleeding  Obtain certification for diabetic shoes Diabetic peripheral neuropathy type II Hammertoe second left Loss of vibratory protective sensation Diminished posterior tibial pulses bilaterally  Reappoint 3 months

## 2017-04-11 NOTE — Patient Instructions (Signed)

## 2017-04-13 ENCOUNTER — Ambulatory Visit: Payer: Medicare Other | Admitting: Podiatry

## 2017-04-20 ENCOUNTER — Ambulatory Visit: Payer: Medicare Other

## 2017-04-27 ENCOUNTER — Ambulatory Visit: Payer: Medicare Other

## 2017-04-27 ENCOUNTER — Encounter: Payer: Self-pay | Admitting: Surgery

## 2017-04-27 ENCOUNTER — Institutional Professional Consult (permissible substitution) (INDEPENDENT_AMBULATORY_CARE_PROVIDER_SITE_OTHER): Payer: Medicare Other | Admitting: Surgery

## 2017-04-27 VITALS — BP 151/93 | HR 94 | Resp 18 | Ht 69.0 in | Wt 243.0 lb

## 2017-04-27 DIAGNOSIS — I7121 Aneurysm of the ascending aorta, without rupture: Secondary | ICD-10-CM

## 2017-04-27 DIAGNOSIS — I712 Thoracic aortic aneurysm, without rupture: Secondary | ICD-10-CM

## 2017-04-27 MED ORDER — HYDROCODONE-ACETAMINOPHEN 5-325 MG PO TABS: 1.0000 | ORAL_TABLET | Freq: Four times a day (QID) | ORAL | 0 refills | Status: DC | PRN

## 2017-04-29 ENCOUNTER — Ambulatory Visit: Payer: Medicare Other | Admitting: Orthotics

## 2017-05-01 ENCOUNTER — Encounter: Payer: Self-pay | Admitting: Surgery

## 2017-05-01 NOTE — Progress Notes (Signed)
Cardiothoracic Surgery Consultation   PCP is [name], MD Referring Provider is [name], MD  Chief Complaint  Patient presents with  . Thoracic Aortic Aneurysm    Surgical eval, CTA Chest/ABD/Pelivis 03/22/17.Marland Kitchenstill having significant pain from right sided rib fractures    HPI:  The patient is a 61 year old gentleman with hypertension, hyperlipidemia, hep C, and prostate cancer s/p prostatectomy who reports tripping in a pothole while walking in late July striking his right chest. He had a lot of pain with movement and breathing and was seen in the ER on 8/7. A CXR showed no abnormality but a CTA of the chest/abdomen/pelvis was done and showed healing fractures of the right second through seventh ribs with some scattered peripheral interstitial infiltrates in both lungs. There was also an incidental 4.0 cm fusiform ascending aortic aneurysm. He has no family history of aortic aneurysm or aortic dissection. He continues to have pain in the right lateral chest wall with breathing and activity. He was taking some Vicodin which helped but only had a small amount given to him in the ER. Tylenol has not helped. He has not taken Ibuprofen.  Past Medical History:  Diagnosis Date  . Arthritis   . Cancer ([place]) 1yrs ago   PROSTATE  . Diabetes mellitus   . Hepatitis    hepatitis c, finished harvoni tx 3 months ago  . Hypertension   . Sleep apnea    not currently using cpap, mask causing vertigo    Past Surgical History:  Procedure Laterality Date  . COLONOSCOPY WITH PROPOFOL N/A 02/19/2014   Procedure: COLONOSCOPY WITH PROPOFOL;  Surgeon: Garlan Fair, MD;  Location: [facility];  Service: Endoscopy;  Laterality: N/A;  . KNEE SURGERY Right    QUADS  . PROSTATE SURGERY      Family History  Problem Relation Age of Onset  . Cancer Father        PROSTATE    Social History Social History  Substance Use Topics  . Smoking status: Current Some Day Smoker    Packs/day:  0.50  . Smokeless tobacco: Never Used     Comment: social smoking only  . Alcohol use No    Current Outpatient Prescriptions  Medication Sig Dispense Refill  . aspirin EC 81 MG tablet Take 81 mg by mouth daily.    . cyclobenzaprine (FLEXERIL) 10 MG tablet Take 1 tablet (10 mg total) by mouth at bedtime as needed for muscle spasms. 10 tablet 0  . losartan (COZAAR) 25 MG tablet Take 25 mg by mouth daily.    . metFORMIN (GLUCOPHAGE) 1000 MG tablet Take 1,000 mg by mouth 2 (two) times daily with a meal.    . Multiple Vitamin (MULTIVITAMIN WITH MINERALS) TABS tablet Take 1 tablet by mouth daily.    . rosuvastatin (CRESTOR) 5 MG tablet Take 5 mg by mouth daily.    Marland Kitchen venlafaxine (EFFEXOR) 75 MG tablet Take 75 mg by mouth every morning.     Marland Kitchen HYDROcodone-acetaminophen (NORCO/VICODIN) 5-325 MG tablet Take 1 tablet by mouth every 6 (six) hours as needed for severe pain. 20 tablet 0   Current Facility-Administered Medications  Medication Dose Route Frequency Provider Last Rate Last Dose  . triamcinolone acetonide (KENALOG) 10 MG/ML injection 10 mg  10 mg Other Once Landis Martins, DPM        No Known Allergies  Review of Systems  Constitutional: Positive for activity change. Negative for chills, fatigue and fever.  HENT: Negative.  Eyes: Negative.   Respiratory: Positive for shortness of breath. Negative for cough.        Right lateral chest wall pain.  Cardiovascular: Positive for leg swelling.  Gastrointestinal: Negative.   Endocrine: Negative.   Genitourinary: Positive for frequency.  Musculoskeletal: Positive for arthralgias.  Skin: Negative.   Allergic/Immunologic: Negative.   Neurological: Negative.   Hematological: Negative.   Psychiatric/Behavioral: Negative.     BP (!) 151/93 (BP Location: Right Arm, Patient Position: Sitting, Cuff Size: Large)   Pulse 94   Resp 18   Ht 5\' 9"  (1.753 m)   Wt 243 lb (110.2 kg)   SpO2 95% Comment: RA  BMI 35.88 kg/m  Physical Exam    Constitutional: He is oriented to person, place, and time. He appears well-developed and well-nourished. No distress.  HENT:  Head: Normocephalic and atraumatic.  Mouth/Throat: Oropharynx is clear and moist.  Eyes: Pupils are equal, round, and reactive to light. EOM are normal.  Neck: Normal range of motion. Neck supple. No thyromegaly present.  Cardiovascular: Normal rate, regular rhythm, normal heart sounds and intact distal pulses.   No murmur heard. Pulmonary/Chest: Effort normal and breath sounds normal. No respiratory distress. He exhibits tenderness.  Tender over right lateral chest wall, no deformity.  Abdominal: Soft. Bowel sounds are normal. He exhibits no distension. There is no tenderness.  Musculoskeletal: Normal range of motion. He exhibits no edema.  Lymphadenopathy:    He has no cervical adenopathy.  Neurological: He is alert and oriented to person, place, and time. He has normal strength. No cranial nerve deficit or sensory deficit.  Skin: Skin is warm and dry.  Psychiatric: He has a normal mood and affect.    Diagnostic Tests:  CLINICAL DATA:  Chest pressure and chest pain for 2 weeks, painful to lie flat, chest pain radiating to abdomen and back, pressure-like pain, history of hypertension, diabetes mellitus, prostate cancer, smoker  EXAM: CT ANGIOGRAPHY CHEST, ABDOMEN AND PELVIS  TECHNIQUE: Multidetector CT imaging through the chest, abdomen and pelvis was performed using the standard protocol during bolus administration of intravenous contrast. Multiplanar reconstructed images and MIPs were obtained and reviewed to evaluate the vascular anatomy. Precontrast CT imaging of the chest was also performed.  CONTRAST:  100 cc Isovue 370 IV  COMPARISON:  CT abdomen and pelvis 10/03/2014, CT chest 09/23/2008  FINDINGS: CTA CHEST FINDINGS  Cardiovascular: Minimal coronary arterial and aortic atherosclerotic calcification. No intramural hematoma on  precontrast imaging. Mild fusiform aneurysmal dilatation ascending thoracic aorta 4.0 cm image 41. Normal aortic enhancement without evidence of dissection or para-aortic hemorrhage/infiltration. Pulmonary artery is suboptimally opacified due to targeted aortic technique, grossly unremarkable. No pericardial effusion.  Mediastinum/Nodes: Esophagus unremarkable. Visualized base of cervical region normal appearance. Few scattered normal size mediastinal and LEFT hilar lymph nodes. Single mildly enlarged RIGHT hilar node 14 mm short axis image 41 previously 9 mm. No additional thoracic adenopathy.  Lungs/Pleura: Scattered interstitial infiltrates in the peripheries of both lungs, new. No segmental consolidation, pleural effusion or pneumothorax. No definite pulmonary nodule.  Musculoskeletal: Healing fractures of the anterior RIGHT second through seventh ribs. Scattered degenerative disc disease changes thoracic spine.  Review of the MIP images confirms the above findings.  CTA ABDOMEN AND PELVIS FINDINGS  VASCULAR  Aorta: Minimal atherosclerotic calcification. Normal caliber. No para-aortic infiltration.  Celiac: Patent, without calcified plaque at origin  SMA: Patent, without calcified plaque at origin  Renals: Patent, without calcified plaque at origin  IMA: Patent, unremarkable  Inflow: Unremarkable,  patent  Veins: Inadequately opacified on arterial phase imaging, suboptimally assessed  Review of the MIP images confirms the above findings.  NON-VASCULAR  Hepatobiliary: Post cholecystectomy. Liver unremarkable. No biliary dilatation.  Pancreas: Normal appearance  Spleen: Normal appearance  Adrenals/Urinary Tract: Adrenal glands normal appearance. Multiple small BILATERAL renal cysts largest mid LEFT kidney 3.2 x 2.7 cm. No definite urinary tract calcification, hydronephrosis or hydroureter. Bladder decompressed though extending low in pelvis  due to prior prostatectomy.  Stomach/Bowel: Normal appendix. Sigmoid diverticulosis without evidence of diverticulitis. Stomach and bowel loops otherwise normal appearance.  Lymphatic: No adenopathy  Reproductive: N/A  Other: No free air free fluid.  Prior ventral herniorrhaphy.  Musculoskeletal: No acute osseous findings.  Review of the MIP images confirms the above findings.  IMPRESSION: Scattered peripheral interstitial infiltrates in both lungs, could represent atypical infection, unlikely edema, could also be seen with chronic he eosinophilic pneumonia, nonspecific interstitial pneumonia, and usual interstitial pneumonia/ idiopathic pulmonary fibrosis.  BILATERAL renal cysts.  Sigmoid diverticulosis.  Single nonspecific mildly enlarged RIGHT hilar lymph node.  Healing fractures of anterior RIGHT second through seventh ribs.  No evidence of aortic dissection.  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 Recommend annual imaging followup by CTA or MRA. This recommendation follows 2010 ACCF/AHA/AATS/ACR/ASA/SCA/SCAI/SIR/STS/SVM Guidelines for the Diagnosis and Management of Patients with Thoracic Aortic Disease. Circulation. 2010; 121: M546-T035   Electronically Signed   By: Lavonia Dana M.D.   On: 03/22/2017 10:44   Impression:  He has an incidental 4.0 fusiform ascending aortic aneurysm that is well-below the surgical threshold of 5.5 cm but will require continued follow up. I reviewed the CT images with him and answered his questions. I discussed the importance of good blood pressure control. I advised him against heavy weight lifting of more than 30 lbs. He also has multiple healing rib fractures in the right chest that are still giving him significant pain. These may take 3 months or more to heal and until then he is going to have to avoid activities that cause excessive pain. I gave him a  prescription for Vicodin today to take at night when he is trying to get comfortable to sleep and advised him to try Ibuprofen otherwise.  Plan:  I will see him back in one year for a CTA of the chest to follow up on his aneurysm. He will follow up with his PCP for his rib fractures and chest wall pain as needed.  I spent 45 minutes performing this consultation and > 50% of this time was spent face to face counseling and coordinating the care of this patient's ascending aortic aneurysm and multiple rib fractures.  Gaye Pollack, MD [facility] [phone]

## 2017-05-05 ENCOUNTER — Ambulatory Visit: Payer: Medicare Other | Admitting: Orthotics

## 2017-05-18 ENCOUNTER — Ambulatory Visit: Payer: Medicare Other

## 2017-06-14 ENCOUNTER — Encounter: Payer: Self-pay | Admitting: Orthotics

## 2017-06-14 ENCOUNTER — Ambulatory Visit (INDEPENDENT_AMBULATORY_CARE_PROVIDER_SITE_OTHER): Payer: Medicare Other | Admitting: Orthotics

## 2017-06-14 DIAGNOSIS — L84 Corns and callosities: Secondary | ICD-10-CM

## 2017-06-14 DIAGNOSIS — M2142 Flat foot [pes planus] (acquired), left foot: Secondary | ICD-10-CM | POA: Diagnosis not present

## 2017-06-14 DIAGNOSIS — M2042 Other hammer toe(s) (acquired), left foot: Secondary | ICD-10-CM

## 2017-06-14 DIAGNOSIS — M2141 Flat foot [pes planus] (acquired), right foot: Secondary | ICD-10-CM | POA: Diagnosis not present

## 2017-06-14 DIAGNOSIS — Z794 Long term (current) use of insulin: Secondary | ICD-10-CM

## 2017-06-14 DIAGNOSIS — E1140 Type 2 diabetes mellitus with diabetic neuropathy, unspecified: Secondary | ICD-10-CM | POA: Diagnosis not present

## 2017-06-14 DIAGNOSIS — Q828 Other specified congenital malformations of skin: Secondary | ICD-10-CM

## 2017-06-26 NOTE — Progress Notes (Signed)

## 2017-07-12 ENCOUNTER — Encounter: Payer: Self-pay | Admitting: Sports Medicine

## 2017-07-12 ENCOUNTER — Ambulatory Visit (INDEPENDENT_AMBULATORY_CARE_PROVIDER_SITE_OTHER): Payer: Medicare Other | Admitting: Sports Medicine

## 2017-07-12 DIAGNOSIS — B351 Tinea unguium: Secondary | ICD-10-CM | POA: Diagnosis not present

## 2017-07-12 DIAGNOSIS — M7752 Other enthesopathy of left foot: Secondary | ICD-10-CM | POA: Diagnosis not present

## 2017-07-12 DIAGNOSIS — E1140 Type 2 diabetes mellitus with diabetic neuropathy, unspecified: Secondary | ICD-10-CM | POA: Diagnosis not present

## 2017-07-12 DIAGNOSIS — L84 Corns and callosities: Secondary | ICD-10-CM

## 2017-07-12 DIAGNOSIS — M779 Enthesopathy, unspecified: Secondary | ICD-10-CM

## 2017-07-12 DIAGNOSIS — M778 Other enthesopathies, not elsewhere classified: Secondary | ICD-10-CM

## 2017-07-12 MED ORDER — TRIAMCINOLONE ACETONIDE 10 MG/ML IJ SUSP: 10.0000 mg | Freq: Once | INTRAMUSCULAR | Status: DC

## 2017-07-12 NOTE — Progress Notes (Signed)
Patient ID: Edrian Melucci, male   DOB: Dec 27, 1955, 61 y.o.   MRN: [id number]  Subjective: Cordarious Zeek is a 61 y.o. male patient with history of type 2 diabetes who presents to office today complaining of painful callus long, painful nails while ambulating in shoes; unable to trim. Patient reports that his left foot is hurting a little more and wants something to help with the pain until he can see his PCP; states that the glucose reading this morning was "107", A1c 6.7. Patient denies any new changes in medication or new problems.   Patient Active Problem List   Diagnosis Date Noted  . Benign paroxysmal positional vertigo 11/08/2013  . RBBB 06/02/2009  . 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 DM 04/11/2009  . OBESITY 04/11/2009  . DEPRESSION 04/11/2009  . HYPERTENSION 04/11/2009  . DIVERTICULAR DISEASE 04/11/2009  . CHOLECYSTITIS 04/11/2009  . CHEST PAIN 04/11/2009  . ABDOMINAL PAIN 04/11/2009   Current Outpatient Medications on File Prior to Visit  Medication Sig Dispense Refill  . aspirin EC 81 MG tablet Take 81 mg by mouth daily.    . cyclobenzaprine (FLEXERIL) 10 MG tablet Take 1 tablet (10 mg total) by mouth at bedtime as needed for muscle spasms. 10 tablet 0  . HYDROcodone-acetaminophen (NORCO/VICODIN) 5-325 MG tablet Take 1 tablet by mouth every 6 (six) hours as needed for severe pain. 20 tablet 0  . losartan (COZAAR) 25 MG tablet Take 25 mg by mouth daily.    . metFORMIN (GLUCOPHAGE) 1000 MG tablet Take 1,000 mg by mouth 2 (two) times daily with a meal.    . Multiple Vitamin (MULTIVITAMIN WITH MINERALS) TABS tablet Take 1 tablet by mouth daily.    . rosuvastatin (CRESTOR) 5 MG tablet Take 5 mg by mouth daily.    Marland Kitchen venlafaxine (EFFEXOR) 75 MG tablet Take 75 mg by mouth every morning.      Current Facility-Administered Medications on File Prior to Visit  Medication Dose Route Frequency Provider Last Rate Last Dose  . triamcinolone acetonide (KENALOG) 10 MG/ML injection 10 mg   10 mg Other Once Landis Martins, DPM       No Known Allergies   Objective: General: Patient is awake, alert, and oriented x 3 and in no acute distress.  Integument: Skin is warm, dry and supple bilateral. Nails are tender, long, thickened and  dystrophic with subungual debris, consistent with onychomycosis, 1-5 bilateral. No signs of infection. No open lesions. + callus present bilateral medial hallux and sub met 5 on left, most tender. Remaining integument unremarkable.  Vasculature:  Dorsalis Pedis pulse 2/4 bilateral. Posterior Tibial pulse  1/4 bilateral.  Capillary fill time <4 sec 1-5 bilateral. Scant hair growth to the level of the digits. Temperature gradient within normal limits. No varicosities present bilateral. No edema present bilateral.   Neurology: The patient has loss of protective sensation measured with a 5.07/10g Semmes Weinstein Monofilament at all pedal sites bilateral . Vibratory diminished bilateral with tuning fork. No Babinski sign present bilateral.   Musculoskeletal:Mild tenderness to Left 5th MTPJ, asymptomatic hammertoe pedal deformities noted bilateral. Muscular strength 5/5 in all lower extremity muscular groups bilateral without pain or limitation on range of motion. No tenderness with calf compression bilateral.  Assessment and Plan: Problem List Items Addressed This Visit    None    Visit Diagnoses    Dermatophytosis of nail    -  Primary   Pre-ulcerative calluses       Type 2 diabetes mellitus with diabetic  neuropathy, without long-term current use of insulin (HCC)       Capsulitis of foot, left       Relevant Medications   triamcinolone acetonide (KENALOG) 10 MG/ML injection 10 mg     -Examined patient. -Discussed and educated patient on diabetic foot care, especially with  regards to the vascular, neurological and musculoskeletal systems.  -Stressed the importance of good glycemic control and the detriment of not  controlling glucose levels in  relation to the foot. -After oral consent and aseptic prep, injected a mixture containing 1 ml of 2%  plain lidocaine, 1 ml 0.5% plain marcaine, 0.5 ml of kenalog 10 and 0.5 ml of dexamethasone phosphate into Left 5th MTPJ without complication. Post-injection care discussed with patient.  -Mechanically debrided callus using sterile chisel blade and all nails 1-5 bilateral using sterile nail nipper and filed with dremel without incident.   -Patient to continue with Diabetic shoes  -Recommend continue Okeeffe's healthy feet cream daily for dry skin -Answered all patient questions -Patient to return as needed or in 3 months for at risk foot care and discussion of treatment for fungal nails after his PCP does blood work  -Patient advised to call the office if any problems or questions arise in the meantime. PATIENT MAY CALL OFFICE BACK TO TELL ME THE NAME OF THE TOPICAL HE IS USING ON HIS NAILS OF WHICH HE CAN CONTINUE TO DO UNTIL HIS BLOOD WORK IS DONE BY HIS PCP FOR [place] TO CONSIDER ADDING ON PO LAMISIL.  Landis Martins, DPM

## 2017-08-15 ENCOUNTER — Ambulatory Visit (INDEPENDENT_AMBULATORY_CARE_PROVIDER_SITE_OTHER): Payer: Medicare Other | Admitting: Vascular Surgery

## 2017-08-15 ENCOUNTER — Encounter: Payer: Self-pay | Admitting: Vascular Surgery

## 2017-08-15 VITALS — BP 151/92 | HR 83 | Resp 20 | Ht 69.0 in | Wt 217.5 lb

## 2017-08-15 DIAGNOSIS — I83892 Varicose veins of left lower extremities with other complications: Secondary | ICD-10-CM

## 2017-08-15 NOTE — Progress Notes (Signed)
Subjective:     Patient ID: Todd [name], male   DOB: Nov 22, 1955, 61 y.o.   MRN: [id number]  HPI This 61 year old male was referred by Dr. Jori Moll polite for evaluation of painful varicosities in the left leg. Patient describes developing bulges in the left posterior calf a few years ago which have continued to enlarge and they've caused aching throbbing and burning discomfort which worsens as the day progresses. He also developed swelling in the left ankle. He has a remote history of DVT 4 or 5 years ago and was treated with Coumadin for about 6 months but has no longer been on anticoagulation. He did not develop chronic swelling from this DVT. He has no symptoms in the right leg.  Past Medical History:  Diagnosis Date  . Arthritis   . Cancer ([place]) 64yrs ago   PROSTATE  . Diabetes mellitus   . DVT (deep venous thrombosis) ([place])   . Hepatitis    hepatitis c, finished harvoni tx 3 months ago  . Hypercholesterolemia   . Hypertension   . Sleep apnea    not currently using cpap, mask causing vertigo    Social History   Tobacco Use  . Smoking status: Current Some Day Smoker    Packs/day: 0.50  . Smokeless tobacco: Never Used  . Tobacco comment: social smoking only  Substance Use Topics  . Alcohol use: No    Family History  Problem Relation Age of Onset  . Cancer Father        PROSTATE    No Known Allergies   Current Outpatient Medications:  .  aspirin EC 81 MG tablet, Take 81 mg by mouth daily., Disp: , Rfl:  .  ibuprofen (ADVIL,MOTRIN) 800 MG tablet, Take 800 mg by mouth every 8 (eight) hours as needed., Disp: , Rfl:  .  losartan (COZAAR) 25 MG tablet, Take 25 mg by mouth daily., Disp: , Rfl:  .  metFORMIN (GLUCOPHAGE) 1000 MG tablet, Take 1,000 mg by mouth 2 (two) times daily with a meal., Disp: , Rfl:  .  Multiple Vitamin (MULTIVITAMIN WITH MINERALS) TABS tablet, Take 1 tablet by mouth daily., Disp: , Rfl:  .  traMADol (ULTRAM) 50 MG tablet, Take by mouth every 12 (twelve)  hours as needed., Disp: , Rfl:  .  venlafaxine (EFFEXOR) 100 MG tablet, Take 100 mg by mouth 2 (two) times daily., Disp: , Rfl:  .  cyclobenzaprine (FLEXERIL) 10 MG tablet, Take 1 tablet (10 mg total) by mouth at bedtime as needed for muscle spasms. (Patient not taking: Reported on 08/15/2017), Disp: 10 tablet, Rfl: 0 .  HYDROcodone-acetaminophen (NORCO/VICODIN) 5-325 MG tablet, Take 1 tablet by mouth every 6 (six) hours as needed for severe pain. (Patient not taking: Reported on 08/15/2017), Disp: 20 tablet, Rfl: 0 .  rosuvastatin (CRESTOR) 5 MG tablet, Take 5 mg by mouth daily., Disp: , Rfl:   Current Facility-Administered Medications:  .  triamcinolone acetonide (KENALOG) 10 MG/ML injection 10 mg, 10 mg, Other, Once, Stover, Titorya, DPM .  triamcinolone acetonide (KENALOG) 10 MG/ML injection 10 mg, 10 mg, Other, Once, Landis Martins, DPM  Vitals:   08/15/17 1155  BP: (!) 151/92  Pulse: 83  Resp: 20  SpO2: 97%  Weight: 217 lb 8 oz (98.7 kg)  Height: 5\' 9"  (1.753 m)    Body mass index is 32.12 kg/m.         Review of Systems Denies chest pain, dyspnea on exertion, PND, orthopnea. Does have history of dilated aortic  arch which is followed by Dr. Susa Loffler. Also history of right knee replacement, diabetes mellitus type 1. Plus hypertension    Objective:   Physical Exam BP (!) 151/92 (BP Location: Left Arm, Patient Position: Sitting, Cuff Size: Normal)   Pulse 83   Resp 20   Ht 5\' 9"  (1.753 m)   Wt 217 lb 8 oz (98.7 kg)   SpO2 97%   BMI 32.12 kg/m     Gen.-alert and oriented x3 in no apparent distress HEENT normal for age Lungs no rhonchi or wheezing Cardiovascular regular rhythm no murmurs carotid pulses 3+ palpable no bruits audible Abdomen soft nontender no palpable masses Musculoskeletal free of  major deformities Skin clear -no rashes Neurologic normal Lower extremities 3+ femoral and dorsalis pedis pulses palpable bilaterally with no edema on the right  1+ on the left Bulging varicosities left posterior calf and a few in the medial calf over great saphenous system. A few reticular veins in the pretibial region and left lateral calf. No hyperpigmentation or ulceration noted.  Today I performed a bedside SonoSite ultrasound exam which revealed a grossly enlarged left small saphenous vein with reflux supplying these painful varicosities in the calf. 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 And then to be evaluated for stab phlebectomy of painful varicosities Return in 3 months

## 2017-08-17 NOTE — Addendum Note (Signed)
Addended by: Lianne Cure A on: 08/17/2017 04:40 PM   Modules accepted: Orders

## 2017-09-27 ENCOUNTER — Ambulatory Visit: Payer: Medicare Other | Admitting: Sports Medicine

## 2017-10-07 ENCOUNTER — Ambulatory Visit
Admission: RE | Admit: 2017-10-07 | Discharge: 2017-10-07 | Disposition: A | Discharge status: Home / Self Care | Payer: Medicare Other | Source: Ambulatory Visit | Attending: Internal Medicine | Admitting: Internal Medicine

## 2017-10-07 ENCOUNTER — Other Ambulatory Visit: Payer: Self-pay | Admitting: Internal Medicine

## 2017-10-07 DIAGNOSIS — I10 Essential (primary) hypertension: Secondary | ICD-10-CM

## 2017-11-15 ENCOUNTER — Ambulatory Visit (HOSPITAL_COMMUNITY)
Admission: RE | Admit: 2017-11-15 | Discharge: 2017-11-15 | Disposition: A | Discharge status: Home / Self Care | Payer: Medicare Other | Source: Ambulatory Visit | Attending: Vascular Surgery | Admitting: Vascular Surgery

## 2017-11-15 ENCOUNTER — Encounter: Payer: Self-pay | Admitting: Vascular Surgery

## 2017-11-15 ENCOUNTER — Ambulatory Visit (INDEPENDENT_AMBULATORY_CARE_PROVIDER_SITE_OTHER): Payer: Medicare Other | Admitting: Vascular Surgery

## 2017-11-15 VITALS — BP 140/86 | HR 78 | Temp 97.8°F | Resp 18 | Ht 69.0 in | Wt 227.8 lb

## 2017-11-15 DIAGNOSIS — I83892 Varicose veins of left lower extremities with other complications: Secondary | ICD-10-CM | POA: Insufficient documentation

## 2017-11-15 NOTE — Progress Notes (Signed)
Subjective:     Patient ID: Todd [name], male   DOB: Mar 20, 1956, 62 y.o.   MRN: [id number]  HPI This 62 year old male returns for 34-month follow-up regarding his painful varicosities in the left posterior and medial calf.  He does have a history of DVT about 5 or 6 years ago and was treated with Coumadin for a few years but not currently on anticoagulation other than aspirin.  He is tried long-leg elastic compression stockings 20-30 mm gradient as well as elevation and ibuprofen but continues to have aching and throbbing discomfort in the left posterior calf with chronic swelling below the knee.  The symptoms are affecting his daily living and are resistant to conservative measures  Past Medical History:  Diagnosis Date  . Arthritis   . 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 Diabetes mellitus   . DVT (deep venous thrombosis) ([place])   . Hepatitis    hepatitis c, finished harvoni tx 3 months ago  . Hypercholesterolemia   . Hypertension   . Sleep apnea    not currently using cpap, mask causing vertigo    Social History   Tobacco Use  . Smoking status: Current Some Day Smoker    Packs/day: 0.50  . Smokeless tobacco: Never Used  . Tobacco comment: social smoking only  Substance Use Topics  . Alcohol use: No    Family History  Problem Relation Age of Onset  . Cancer Father        PROSTATE    No Known Allergies   Current Outpatient Medications:  .  aspirin EC 81 MG tablet, Take 81 mg by mouth daily., Disp: , Rfl:  .  cyclobenzaprine (FLEXERIL) 10 MG tablet, Take 1 tablet (10 mg total) by mouth at bedtime as needed for muscle spasms., Disp: 10 tablet, Rfl: 0 .  ibuprofen (ADVIL,MOTRIN) 800 MG tablet, Take 800 mg by mouth every 8 (eight) hours as needed., Disp: , Rfl:  .  losartan (COZAAR) 25 MG tablet, Take 25 mg by mouth daily., Disp: , Rfl:  .  metFORMIN (GLUCOPHAGE) 1000 MG tablet, Take 1,000 mg by mouth 2 (two) times daily with a meal., Disp: , Rfl:  .  Multiple Vitamin  (MULTIVITAMIN WITH MINERALS) TABS tablet, Take 1 tablet by mouth daily., Disp: , Rfl:  .  rosuvastatin (CRESTOR) 5 MG tablet, Take 5 mg by mouth daily., Disp: , Rfl:  .  traMADol (ULTRAM) 50 MG tablet, Take by mouth every 12 (twelve) hours as needed., Disp: , Rfl:  .  venlafaxine (EFFEXOR) 100 MG tablet, Take 100 mg by mouth 2 (two) times daily., Disp: , Rfl:  .  HYDROcodone-acetaminophen (NORCO/VICODIN) 5-325 MG tablet, Take 1 tablet by mouth every 6 (six) hours as needed for severe pain. (Patient not taking: Reported on 08/15/2017), Disp: 20 tablet, Rfl: 0  Current Facility-Administered Medications:  .  triamcinolone acetonide (KENALOG) 10 MG/ML injection 10 mg, 10 mg, Other, Once, Stover, Titorya, DPM .  triamcinolone acetonide (KENALOG) 10 MG/ML injection 10 mg, 10 mg, Other, Once, Landis Martins, DPM  Vitals:   11/15/17 1044  BP: 140/86  Pulse: 78  Resp: 18  Temp: 97.8 F (36.6 C)  TempSrc: Oral  SpO2: 96%  Weight: 227 lb 12.8 oz (103.3 kg)  Height: 5\' 9"  (1.753 m)    Body mass index is 33.64 kg/m.         Review of Systems Denies chest pain, dyspnea on exertion, PND, orthopnea, hemoptysis, claudication    Objective:  Physical Exam BP 140/86 (BP Location: Right Arm, Patient Position: Sitting, Cuff Size: Large)   Pulse 78   Temp 97.8 F (36.6 C) (Oral)   Resp 18   Ht 5\' 9"  (1.753 m)   Wt 227 lb 12.8 oz (103.3 kg)   SpO2 96%   BMI 33.64 kg/m   General-well-developed well-nourished male no apparent distress alert and oriented x3 Lungs no rhonchi or wheezing Left leg with large bulging varicosities in the posterior calf and some in the anteromedial calf over the great saphenous system.  Mild hyperpigmentation lower third left leg with trace edema distally.  3+ dorsalis pedis pulse palpable.  Today ordered a venous duplex exam of the left leg which I reviewed and interpreted.  There is no DVT.  The left great saphenous vein is unremarkable.  The left small  saphenous vein is enlarged with gross reflux throughout supplying these painful varicosities  I confirmed the above findings with the bedside SonoSite ultrasound exam revealing an enlarged left small saphenous vein with gross reflux     Assessment:     Painful varicosities left calf due to gross reflux left small saphenous vein causing symptoms which are affecting patient's daily living and resistant to conservative measures including a long-leg elastic compression stockings, elevation, and ibuprofen    Plan:     Patient needs laser ablation left small saphenous vein and then her 66-month waiting.  To be reevaluated for possible stab phlebectomy of residual painful varicosities if indicated We will proceed with precertification perform this in the near future

## 2017-11-16 ENCOUNTER — Other Ambulatory Visit: Payer: Self-pay | Admitting: *Deleted

## 2017-11-16 DIAGNOSIS — I83892 Varicose veins of left lower extremities with other complications: Secondary | ICD-10-CM

## 2017-11-22 ENCOUNTER — Encounter: Payer: Self-pay | Admitting: Vascular Surgery

## 2017-11-22 ENCOUNTER — Ambulatory Visit (INDEPENDENT_AMBULATORY_CARE_PROVIDER_SITE_OTHER): Payer: Medicare Other | Admitting: Vascular Surgery

## 2017-11-22 VITALS — BP 152/92 | HR 89 | Temp 98.3°F | Resp 18 | Ht 69.0 in | Wt 228.0 lb

## 2017-11-22 DIAGNOSIS — I83892 Varicose veins of left lower extremities with other complications: Secondary | ICD-10-CM | POA: Diagnosis not present

## 2017-11-22 HISTORY — PX: ENDOVENOUS ABLATION SAPHENOUS VEIN W/ LASER: SUR449

## 2017-11-22 NOTE — Progress Notes (Signed)
Laser Ablation Procedure    Date: 11/22/2017   Todd [name] DOB:1955-11-10  Consent signed: Yes    Surgeon:  Dr. Nelda Severe. Kellie Simmering  Procedure: Laser Ablation: left Small Saphenous Vein  BP (!) 152/92 (BP Location: Left Arm, Patient Position: Sitting, Cuff Size: Large)   Pulse 89   Temp 98.3 F (36.8 C) (Oral)   Resp 18   Ht 5\' 9"  (1.753 m)   Wt 228 lb (103.4 kg)   SpO2 97%   BMI 33.67 kg/m   Tumescent Anesthesia: 250  cc 0.9% NaCl with 50 cc Lidocaine HCL 1% and 15 cc 8.4% NaHCO3  Local Anesthesia: 4 cc Lidocaine HCL and NaHCO3 (ratio 2:1)  Pulsed Mode: 15 watts, 584ms delay, 1.0 duration  Total Energy:   838 Joules           Total Pulses: 56              Total Time: 0:56    Patient tolerated procedure well    Description of Procedure:  After marking the course of the secondary varicosities, the patient was placed on the operating table in the prone position, and the left leg was prepped and draped in sterile fashion.   Local anesthetic was administered and under ultrasound guidance the saphenous vein was accessed with a micro needle and guide wire; then the mirco puncture sheath was placed.  A guide wire was inserted saphenopopliteal junction , followed by a 5 french sheath.  The position of the sheath and then the laser fiber below the junction was confirmed using the ultrasound.  Tumescent anesthesia was administered along the course of the saphenous vein using ultrasound guidance. The patient was placed in Trendelenburg position and protective laser glasses were placed on patient and staff, and the laser was fired at 15 watts continuous mode advancing 1-92mm/second for a total of 838 joules.     Steri strip was applied to the IV insertion site and ABD pads and thigh high compression stockings were applied.  Ace wrap bandages were applied over the left calf and at the top of the saphenopopliteal junction. Blood loss was less than 15 cc.  The patient ambulated out of the  operating room having tolerated the procedure well.

## 2017-11-22 NOTE — Progress Notes (Signed)
Subjective:     Patient ID: Todd [name], male   DOB: [birth year], 62 y.o.   MRN: [id number]  HPI This 62 year old male had laser ablation of the left small saphenous vein performed under local tumescent anesthesia.  A total of 838 J of energy was utilized.  He tolerated the procedure well.  Review of Systems     Objective:   Physical Exam BP (!) 152/92 (BP Location: Left Arm, Patient Position: Sitting, Cuff Size: Large)   Pulse 89   Temp 98.3 F (36.8 C) (Oral)   Resp 18   Ht 5\' 9"  (1.753 m)   Wt 228 lb (103.4 kg)   SpO2 97%   BMI 33.67 kg/m        Assessment:     Well-tolerated laser ablation left small saphenous vein performed under local tumescent anesthesia    Plan:     Return in 1 week for a venous duplex exam to confirm closure left small saphenous vein Patient will then return in 3 months to be evaluated for possible stab phlebectomy of painful varicosities

## 2017-11-24 ENCOUNTER — Encounter: Payer: Self-pay | Admitting: Vascular Surgery

## 2017-11-28 ENCOUNTER — Other Ambulatory Visit: Payer: Self-pay

## 2017-11-28 ENCOUNTER — Ambulatory Visit (INDEPENDENT_AMBULATORY_CARE_PROVIDER_SITE_OTHER): Payer: Medicare Other | Admitting: Vascular Surgery

## 2017-11-28 ENCOUNTER — Ambulatory Visit (HOSPITAL_COMMUNITY)
Admission: RE | Admit: 2017-11-28 | Discharge: 2017-11-28 | Disposition: A | Discharge status: Home / Self Care | Payer: Medicare Other | Source: Ambulatory Visit | Attending: Vascular Surgery | Admitting: Vascular Surgery

## 2017-11-28 ENCOUNTER — Encounter: Payer: Self-pay | Admitting: Vascular Surgery

## 2017-11-28 VITALS — BP 147/90 | HR 84 | Temp 100.0°F | Resp 18 | Ht 69.0 in | Wt 225.0 lb

## 2017-11-28 DIAGNOSIS — I82492 Acute embolism and thrombosis of other specified deep vein of left lower extremity: Secondary | ICD-10-CM | POA: Diagnosis not present

## 2017-11-28 DIAGNOSIS — I83892 Varicose veins of left lower extremities with other complications: Secondary | ICD-10-CM | POA: Insufficient documentation

## 2017-11-28 DIAGNOSIS — I82412 Acute embolism and thrombosis of left femoral vein: Secondary | ICD-10-CM | POA: Insufficient documentation

## 2017-11-28 DIAGNOSIS — I82432 Acute embolism and thrombosis of left popliteal vein: Secondary | ICD-10-CM | POA: Diagnosis not present

## 2017-11-28 NOTE — Progress Notes (Signed)
Vitals:   11/28/17 1049  BP: (!) 143/91  Pulse: 83  Resp: 18  Temp: 100 F (37.8 C)  TempSrc: Oral  SpO2: 97%  Weight: 225 lb (102.1 kg)  Height: 5\' 9"  (1.753 m)

## 2017-11-28 NOTE — Progress Notes (Signed)
Subjective:     Patient ID: Todd [name], male   DOB: [birth year], 62 y.o.   MRN: [id number]  HPI This 62 year old male returns 3 months following laser ablation for gross reflux with painful varicosities and swelling.  The patient did have a remote history of DVT after prostate surgery 10 years ago left small saphenous vein.  He had been on Coumadin for a period of time but that was discontinued at least one year ago.  He currently takes one aspirin per day.  He continues to have some discomfort in the medial calf region but no change in the edema.  He has worn his elastic compression stockings.  Past Medical History:  Diagnosis Date  . Arthritis   . Cancer ([place]) 48yrs ago   PROSTATE  . Diabetes mellitus   . DVT (deep venous thrombosis) ([place])   . Hepatitis    hepatitis c, finished harvoni tx 3 months ago  . Hypercholesterolemia   . Hypertension   . Sleep apnea    not currently using cpap, mask causing vertigo    Social History   Tobacco Use  . Smoking status: Current Some Day Smoker    Packs/day: 0.50  . Smokeless tobacco: Never Used  . Tobacco comment: social smoking only  Substance Use Topics  . Alcohol use: No    Family History  Problem Relation Age of Onset  . Cancer Father        PROSTATE    No Known Allergies   Current Outpatient Medications:  .  aspirin EC 81 MG tablet, Take 81 mg by mouth daily., Disp: , Rfl:  .  atorvastatin (LIPITOR) 10 MG tablet, , Disp: , Rfl: 3 .  cyclobenzaprine (FLEXERIL) 10 MG tablet, Take 1 tablet (10 mg total) by mouth at bedtime as needed for muscle spasms., Disp: 10 tablet, Rfl: 0 .  ibuprofen (ADVIL,MOTRIN) 800 MG tablet, Take 800 mg by mouth every 8 (eight) hours as needed., Disp: , Rfl:  .  losartan (COZAAR) 25 MG tablet, Take 25 mg by mouth daily., Disp: , Rfl:  .  meloxicam (MOBIC) 15 MG tablet, Take 15 mg by mouth daily., Disp: , Rfl:  .  metFORMIN (GLUCOPHAGE) 1000 MG tablet, Take 1,000 mg by mouth 2 (two) times daily with a  meal., Disp: , Rfl:  .  Multiple Vitamin (MULTIVITAMIN WITH MINERALS) TABS tablet, Take 1 tablet by mouth daily., Disp: , Rfl:  .  ONETOUCH VERIO test strip, , Disp: , Rfl: 11 .  rosuvastatin (CRESTOR) 5 MG tablet, Take 5 mg by mouth daily., Disp: , Rfl:  .  traMADol (ULTRAM) 50 MG tablet, Take by mouth every 12 (twelve) hours as needed., Disp: , Rfl:  .  venlafaxine (EFFEXOR) 100 MG tablet, Take 100 mg by mouth 2 (two) times daily., Disp: , Rfl:  .  HYDROcodone-acetaminophen (NORCO/VICODIN) 5-325 MG tablet, Take 1 tablet by mouth every 6 (six) hours as needed for severe pain. (Patient not taking: Reported on 11/28/2017), Disp: 20 tablet, Rfl: 0  Current Facility-Administered Medications:  .  triamcinolone acetonide (KENALOG) 10 MG/ML injection 10 mg, 10 mg, Other, Once, Stover, Titorya, DPM .  triamcinolone acetonide (KENALOG) 10 MG/ML injection 10 mg, 10 mg, Other, Once, Landis Martins, DPM  Vitals:   11/28/17 1049 11/28/17 1053  BP: (!) 143/91 (!) 147/90  Pulse: 83 84  Resp: 18   Temp: 100 F (37.8 C)   TempSrc: Oral   SpO2: 97%   Weight: 225 lb (102.1 kg)  Height: 5\' 9"  (1.753 m)     Body mass index is 33.23 kg/m.         Review of Systems Denies chest pain, dyspnea on exertion, PND, orthopnea, hemoptysis    Objective:   Physical Exam BP (!) 147/90 (BP Location: Right Arm, Patient Position: Sitting, Cuff Size: Normal)   Pulse 84   Temp 100 F (37.8 C) (Oral)   Resp 18   Ht 5\' 9"  (1.753 m)   Wt 225 lb (102.1 kg)   SpO2 97%   BMI 33.23 kg/m   Well-developed well-nourished male no apparent distress alert and oriented x3 Lungs no rhonchi or wheezing Cardiovascular regular rhythm no murmurs Left leg with some bulging varicosities in the medial calf anteriorly over the great saphenous system with some palpated thrombosed varicosities in the posterior calf with mild tenderness to deep palpation over small saphenous vein  Today ordered a venous duplex exam of the  left leg which I reviewed and interpreted.  The left small saphenous vein is occluded There is chronic obstruction in the left popliteal distal superficial femoral and to some degree in the common femoral vein on the left.  I confirmed these findings with the bedside sono site ultrasound exam.       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 Oneida Alar for that evaluation

## 2017-11-29 ENCOUNTER — Encounter: Payer: Self-pay | Admitting: Sports Medicine

## 2017-11-29 ENCOUNTER — Ambulatory Visit (INDEPENDENT_AMBULATORY_CARE_PROVIDER_SITE_OTHER): Payer: Medicare Other | Admitting: Sports Medicine

## 2017-11-29 ENCOUNTER — Ambulatory Visit: Payer: Medicare Other | Admitting: Sports Medicine

## 2017-11-29 DIAGNOSIS — L84 Corns and callosities: Secondary | ICD-10-CM

## 2017-11-29 DIAGNOSIS — E1140 Type 2 diabetes mellitus with diabetic neuropathy, unspecified: Secondary | ICD-10-CM | POA: Diagnosis not present

## 2017-11-29 DIAGNOSIS — B351 Tinea unguium: Secondary | ICD-10-CM

## 2017-11-29 NOTE — Progress Notes (Signed)
Patient ID: Todd [name], male   DOB: [birth year], 62 y.o.   MRN: [id number]  Subjective: Todd [name] is a 62 y.o. male patient with history of type 2 diabetes who presents to office today complaining of painful callus and long, painful nails while ambulating in shoes; unable to trim. Patient states that the glucose reading this morning was "120", A1c 6.5. Patient denies any new changes in medication or new problems.   Patient Active Problem List   Diagnosis Date Noted  . Varicose veins of left lower extremity with complications 01/75/1025  . Benign paroxysmal positional vertigo 11/08/2013  . RBBB 06/02/2009  . 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 DM 04/11/2009  . OBESITY 04/11/2009  . DEPRESSION 04/11/2009  . HYPERTENSION 04/11/2009  . DIVERTICULAR DISEASE 04/11/2009  . CHOLECYSTITIS 04/11/2009  . CHEST PAIN 04/11/2009  . ABDOMINAL PAIN 04/11/2009   Current Outpatient Medications on File Prior to Visit  Medication Sig Dispense Refill  . aspirin EC 81 MG tablet Take 81 mg by mouth daily.    Marland Kitchen atorvastatin (LIPITOR) 10 MG tablet   3  . cyclobenzaprine (FLEXERIL) 10 MG tablet Take 1 tablet (10 mg total) by mouth at bedtime as needed for muscle spasms. 10 tablet 0  . HYDROcodone-acetaminophen (NORCO/VICODIN) 5-325 MG tablet Take 1 tablet by mouth every 6 (six) hours as needed for severe pain. (Patient not taking: Reported on 11/28/2017) 20 tablet 0  . ibuprofen (ADVIL,MOTRIN) 800 MG tablet Take 800 mg by mouth every 8 (eight) hours as needed.    Marland Kitchen losartan (COZAAR) 25 MG tablet Take 25 mg by mouth daily.    . meloxicam (MOBIC) 15 MG tablet Take 15 mg by mouth daily.    . metFORMIN (GLUCOPHAGE) 1000 MG tablet Take 1,000 mg by mouth 2 (two) times daily with a meal.    . Multiple Vitamin (MULTIVITAMIN WITH MINERALS) TABS tablet Take 1 tablet by mouth daily.    Glory Rosebush VERIO test strip   11  . rosuvastatin (CRESTOR) 5 MG tablet Take 5 mg by mouth daily.    . traMADol (ULTRAM) 50 MG  tablet Take by mouth every 12 (twelve) hours as needed.    . venlafaxine (EFFEXOR) 100 MG tablet Take 100 mg by mouth 2 (two) times daily.     Current Facility-Administered Medications on File Prior to Visit  Medication Dose Route Frequency Provider Last Rate Last Dose  . triamcinolone acetonide (KENALOG) 10 MG/ML injection 10 mg  10 mg Other Once Landis Martins, DPM      . triamcinolone acetonide (KENALOG) 10 MG/ML injection 10 mg  10 mg Other Once Landis Martins, DPM       No Known Allergies   Objective: General: Patient is awake, alert, and oriented x 3 and in no acute distress.  Integument: Skin is warm, dry and supple bilateral. Nails are tender, long, thickened and dystrophic with subungual debris, consistent with onychomycosis, 1-5 bilateral. No signs of infection. No open lesions. + callus present bilateral medial hallux and minimal sub met 5 on left. Remaining integument unremarkable.  Vasculature:  Dorsalis Pedis pulse 2/4 bilateral. Posterior Tibial pulse  1/4 bilateral. Capillary fill time <4 sec 1-5 bilateral. Scant hair growth to the level of the digits.Temperature gradient within normal limits. No varicosities present bilateral. No edema present bilateral.   Neurology: The patient has loss of protective sensation measured with a 5.07/10g Semmes Weinstein Monofilament at all pedal sites bilateral . Vibratory diminished bilateral with tuning fork. No Babinski sign present bilateral.  Musculoskeletal:No tenderness to Left 5th MTPJ, asymptomatic hammertoe pedal deformities noted bilateral. Muscular strength 5/5 in all lower extremity muscular groups bilateral without pain or limitation on range of motion. No tenderness with calf compression bilateral.  Assessment and Plan: Problem List Items Addressed This Visit    None    Visit Diagnoses    Dermatophytosis of nail    -  Primary   Pre-ulcerative calluses       Type 2 diabetes mellitus with diabetic neuropathy, without  long-term current use of insulin ([place])         -Examined patient. -Discussed and educated patient on diabetic foot care, especially with  regards to the vascular, neurological and musculoskeletal systems.  -Stressed the importance of good glycemic control and the detriment of not  controlling glucose levels in relation to the foot. -Mechanically debrided callusx3  using sterile chisel blade and all nails 1-5 bilateral using sterile nail nipper and filed with dremel without incident.   -Patient to continue with Diabetic shoes  -Recommend again Okeeffe's healthy feet cream daily for dry skin -Answered all patient questions -Patient to return as needed or in 3 months for at risk foot care  -Patient advised to call the office if any problems or questions arise in the meantime.  Landis Martins, DPM

## 2018-02-21 ENCOUNTER — Ambulatory Visit: Payer: Medicare Other | Admitting: Sports Medicine

## 2018-02-23 ENCOUNTER — Encounter: Payer: Self-pay | Admitting: Vascular Surgery

## 2018-02-23 ENCOUNTER — Other Ambulatory Visit: Payer: Self-pay

## 2018-02-23 ENCOUNTER — Ambulatory Visit (INDEPENDENT_AMBULATORY_CARE_PROVIDER_SITE_OTHER): Payer: Medicare Other | Admitting: Vascular Surgery

## 2018-02-23 VITALS — BP 170/94 | HR 75 | Temp 98.5°F | Resp 18 | Ht 69.0 in | Wt 236.3 lb

## 2018-02-23 DIAGNOSIS — I83813 Varicose veins of bilateral lower extremities with pain: Secondary | ICD-10-CM | POA: Diagnosis not present

## 2018-02-23 NOTE — Progress Notes (Signed)
Patient name: Todd [name] MRN: [id number] DOB: Mar 21, 1956 Sex: male  REASON FOR CONSULT: Symptomatic varicose veins pain  HPI: Todd [name] is a 62 y.o. male, returns for follow-up today after laser ablation the left lesser saphenous vein by my partner Dr. Kellie Simmering.  He has a remote history of DVT in that leg as well.  He was on Coumadin in the past but not currently.  He continues to be compliant with his compression stockings.  He returns today for consideration of stab avulsions of the remaining varicose veins.  Chronic medical problems remain arthritis prior prostate cancer diabetes elevated cholesterol hypertension all of which are currently stable.  Past Medical History:  Diagnosis Date  . Arthritis   . Cancer ([place]) 64yrs ago   PROSTATE  . Diabetes mellitus   . DVT (deep venous thrombosis) ([place])   . Hepatitis    hepatitis c, finished harvoni tx 3 months ago  . Hypercholesterolemia   . Hypertension   . Sleep apnea    not currently using cpap, mask causing vertigo   Past Surgical History:  Procedure Laterality Date  . COLONOSCOPY WITH PROPOFOL N/A 02/19/2014   Procedure: COLONOSCOPY WITH PROPOFOL;  Surgeon: Garlan Fair, MD;  Location: [facility];  Service: Endoscopy;  Laterality: N/A;  . ENDOVENOUS ABLATION SAPHENOUS VEIN W/ LASER Left 11/22/2017   endovenous laser ablation L SSV by Tinnie Gens MD   . [place] Right    QUADS  . PROSTATE SURGERY      Family History  Problem Relation Age of Onset  . Cancer Father        PROSTATE    SOCIAL HISTORY: Social History   Socioeconomic History  . Marital status: Single    Spouse name: Not on file  . Number of children: 2  . Years of education: 12th  . Highest education level: Not on file  Occupational History  . Occupation: post office  Social Needs  . Financial resource strain: Not on file  . Food insecurity:    Worry: Not on file    Inability: Not on file  . Transportation needs:    Medical: Not on  file    Non-medical: Not on file  Tobacco Use  . Smoking status: Current Some Day Smoker    Packs/day: 0.50  . Smokeless tobacco: Never Used  . Tobacco comment: social smoking only  Substance and Sexual Activity  . Alcohol use: No  . Drug use: No  . Sexual activity: Not on file  Lifestyle  . Physical activity:    Days per week: Not on file    Minutes per session: Not on file  . Stress: Not on file  Relationships  . Social connections:    Talks on phone: Not on file    Gets together: Not on file    Attends religious service: Not on file    Active member of club or organization: Not on file    Attends meetings of clubs or organizations: Not on file    Relationship status: Not on file  . Intimate partner violence:    Fear of current or ex partner: Not on file    Emotionally abused: Not on file    Physically abused: Not on file    Forced sexual activity: Not on file  Other Topics Concern  . Not on file  Social History Narrative   Patient lives at home alone.Marland KitchenMarland KitchenDrinks coffee daily     No Known Allergies  Current Outpatient Medications  Medication Sig Dispense Refill  . aspirin EC 81 MG tablet Take 81 mg by mouth daily.    Marland Kitchen atorvastatin (LIPITOR) 10 MG tablet   3  . losartan (COZAAR) 25 MG tablet Take 25 mg by mouth daily.    . metFORMIN (GLUCOPHAGE) 1000 MG tablet Take 1,000 mg by mouth 2 (two) times daily with a meal.    . Multiple Vitamin (MULTIVITAMIN WITH MINERALS) TABS tablet Take 1 tablet by mouth daily.    Glory Rosebush VERIO test strip   11  . traMADol (ULTRAM) 50 MG tablet Take by mouth every 12 (twelve) hours as needed.    . venlafaxine (EFFEXOR) 100 MG tablet Take 100 mg by mouth 2 (two) times daily.    . cyclobenzaprine (FLEXERIL) 10 MG tablet Take 1 tablet (10 mg total) by mouth at bedtime as needed for muscle spasms. (Patient not taking: Reported on 02/23/2018) 10 tablet 0  . HYDROcodone-acetaminophen (NORCO/VICODIN) 5-325 MG tablet Take 1 tablet by mouth every 6  (six) hours as needed for severe pain. (Patient not taking: Reported on 11/28/2017) 20 tablet 0  . ibuprofen (ADVIL,MOTRIN) 800 MG tablet Take 800 mg by mouth every 8 (eight) hours as needed.    . meloxicam (MOBIC) 15 MG tablet Take 15 mg by mouth daily.    . rosuvastatin (CRESTOR) 5 MG tablet Take 5 mg by mouth daily.     Current Facility-Administered Medications  Medication Dose Route Frequency Provider Last Rate Last Dose  . triamcinolone acetonide (KENALOG) 10 MG/ML injection 10 mg  10 mg Other Once Landis Martins, DPM      . triamcinolone acetonide (KENALOG) 10 MG/ML injection 10 mg  10 mg Other Once Landis Martins, DPM        ROS:   General:  No weight loss, Fever, chills  Cardiac: No recent episodes of chest pain/pressure, no shortness of breath at rest.  No shortness of breath with exertion.  Denies history of atrial fibrillation or irregular heartbeat  Pulmonary: No home oxygen, no productive cough, no hemoptysis,  No asthma or wheezing   Physical Examination  Vitals:   02/23/18 1019 02/23/18 1023  BP: (!) 166/92 (!) 170/94  Pulse: 75   Resp: 18   Temp: 98.5 F (36.9 C)   TempSrc: Oral   SpO2: 95%   Weight: 236 lb 4.8 oz (107.2 kg)   Height: 5\' 9"  (1.753 m)     Body mass index is 34.9 kg/m.  General:  Alert and oriented, no acute distress HEENT: Normal Pulmonary: Clear to auscultation bilaterally Cardiac: Regular Rate and Rhythm without murmur Skin: No rash, large varicosities bulging over the right medial thigh.  He does have some pain over the stab site from the lesser saphenous laser ablation. Extremity Pulses:  2+ radial, brachial, femoral, dorsalis pedis, posterior tibial pulses bilaterally Musculoskeletal: No deformity or edema  Neurologic: Upper and lower extremity motor 5/5 and symmetric            ASSESSMENT: Successful laser ablation of left lesser saphenous with essentially no residual large varicosities in the left leg.  Tenderness over  the stab site should improve with time.  We will review him again in about 3 months to see if this is improved.  He is now developing worsening varicose veins in the right lower extremity.  He will be compliant with wearing light leg compression stockings over the next few months.  He will return in 3 months time for further follow-up.  If his symptoms  have not improved we would consider laser ablation on that leg.  We will have him obtain a reflux duplex exam of the right lower extremity when he returns in 3 months for further evaluation.   PLAN: See above   Ruta Hinds, MD [facility] Office: [phone] Pager: [phone]

## 2018-02-27 ENCOUNTER — Other Ambulatory Visit: Payer: Self-pay

## 2018-02-27 DIAGNOSIS — I83892 Varicose veins of left lower extremities with other complications: Secondary | ICD-10-CM

## 2018-03-07 ENCOUNTER — Telehealth (HOSPITAL_COMMUNITY): Payer: Self-pay | Admitting: Psychology

## 2018-03-30 ENCOUNTER — Other Ambulatory Visit: Payer: Self-pay | Admitting: Surgery

## 2018-03-30 DIAGNOSIS — I712 Thoracic aortic aneurysm, without rupture: Secondary | ICD-10-CM

## 2018-03-30 DIAGNOSIS — I7121 Aneurysm of the ascending aorta, without rupture: Secondary | ICD-10-CM

## 2018-04-13 ENCOUNTER — Emergency Department (HOSPITAL_COMMUNITY): Payer: Medicare Other

## 2018-04-13 ENCOUNTER — Emergency Department (HOSPITAL_COMMUNITY)
Admission: EM | Admit: 2018-04-13 | Discharge: 2018-04-14 | Disposition: A | Discharge status: Home / Self Care | Payer: Medicare Other | Attending: Emergency Medicine | Admitting: Emergency Medicine

## 2018-04-13 ENCOUNTER — Encounter (HOSPITAL_COMMUNITY): Payer: Self-pay | Admitting: Emergency Medicine

## 2018-04-13 ENCOUNTER — Other Ambulatory Visit: Payer: Self-pay

## 2018-04-13 DIAGNOSIS — Z8546 Personal history of malignant neoplasm of prostate: Secondary | ICD-10-CM | POA: Insufficient documentation

## 2018-04-13 DIAGNOSIS — Z7982 Long term (current) use of aspirin: Secondary | ICD-10-CM | POA: Insufficient documentation

## 2018-04-13 DIAGNOSIS — F10920 Alcohol use, unspecified with intoxication, uncomplicated: Secondary | ICD-10-CM | POA: Insufficient documentation

## 2018-04-13 DIAGNOSIS — F17200 Nicotine dependence, unspecified, uncomplicated: Secondary | ICD-10-CM | POA: Insufficient documentation

## 2018-04-13 DIAGNOSIS — Z7984 Long term (current) use of oral hypoglycemic drugs: Secondary | ICD-10-CM | POA: Insufficient documentation

## 2018-04-13 DIAGNOSIS — Z79899 Other long term (current) drug therapy: Secondary | ICD-10-CM | POA: Insufficient documentation

## 2018-04-13 DIAGNOSIS — Y929 Unspecified place or not applicable: Secondary | ICD-10-CM | POA: Diagnosis not present

## 2018-04-13 DIAGNOSIS — Y9389 Activity, other specified: Secondary | ICD-10-CM | POA: Insufficient documentation

## 2018-04-13 DIAGNOSIS — Y998 Other external cause status: Secondary | ICD-10-CM | POA: Diagnosis not present

## 2018-04-13 DIAGNOSIS — W01198A Fall on same level from slipping, tripping and stumbling with subsequent striking against other object, initial encounter: Secondary | ICD-10-CM | POA: Diagnosis not present

## 2018-04-13 DIAGNOSIS — N179 Acute kidney failure, unspecified: Secondary | ICD-10-CM | POA: Insufficient documentation

## 2018-04-13 DIAGNOSIS — E119 Type 2 diabetes mellitus without complications: Secondary | ICD-10-CM | POA: Insufficient documentation

## 2018-04-13 DIAGNOSIS — S0990XA Unspecified injury of head, initial encounter: Secondary | ICD-10-CM | POA: Diagnosis not present

## 2018-04-13 DIAGNOSIS — W19XXXA Unspecified fall, initial encounter: Secondary | ICD-10-CM

## 2018-04-13 DIAGNOSIS — I10 Essential (primary) hypertension: Secondary | ICD-10-CM | POA: Diagnosis not present

## 2018-04-13 LAB — CBC
HEMATOCRIT: 43 % (ref 39.0–52.0)
HEMOGLOBIN: 13.8 g/dL (ref 13.0–17.0)
MCH: 31.9 pg (ref 26.0–34.0)
MCHC: 32.1 g/dL (ref 30.0–36.0)
MCV: 99.5 fL (ref 78.0–100.0)
Platelets: 189 10*3/uL (ref 150–400)
RBC: 4.32 MIL/uL (ref 4.22–5.81)
RDW: 13.3 % (ref 11.5–15.5)
WBC: 6.1 10*3/uL (ref 4.0–10.5)

## 2018-04-13 LAB — BASIC METABOLIC PANEL
ANION GAP: 18 — AB (ref 5–15)
BUN: 25 mg/dL — ABNORMAL HIGH (ref 8–23)
CALCIUM: 9.4 mg/dL (ref 8.9–10.3)
CO2: 23 mmol/L (ref 22–32)
Chloride: 98 mmol/L (ref 98–111)
Creatinine, Ser: 3.35 mg/dL — ABNORMAL HIGH (ref 0.61–1.24)
GFR calc Af Amer: 21 mL/min — ABNORMAL LOW (ref >60)
GFR calc non Af Amer: 18 mL/min — ABNORMAL LOW (ref >60)
GLUCOSE: 157 mg/dL — AB (ref 70–99)
POTASSIUM: 3.9 mmol/L (ref 3.5–5.1)
Sodium: 139 mmol/L (ref 135–145)

## 2018-04-13 LAB — CBG MONITORING, ED: Glucose-Capillary: 151 mg/dL — ABNORMAL HIGH (ref 70–99)

## 2018-04-13 NOTE — ED Triage Notes (Signed)
Pt fell and hit back of head on concrete while clearly intoxicated. Pt AO x 4. Hematoma to back of head. Pupils equal and reactive pt endorses nausea. 111/73, 78, CBG 110.

## 2018-04-13 NOTE — ED Notes (Signed)
Pt placed on 2 L [place].  

## 2018-04-13 NOTE — ED Notes (Signed)
Patient transported to CT 

## 2018-04-13 NOTE — ED Notes (Addendum)
Pt placed in C-collar

## 2018-04-13 NOTE — ED Provider Notes (Signed)
[facility] EMERGENCY DEPARTMENT Provider Note   CSN: [id number] Arrival date & time: 04/13/18  1923     History   Chief Complaint Chief Complaint  Patient presents with  . Fall    HPI Prashant Glosser is a 62 y.o. male.  HPI   Trayshawn Durkin is a 62 year old male with history of hypertension, hyperlipidemia, type 2 diabetes, sleep apnea who presents to the emergency department after hitting his head on concrete while intoxicated.  He is a very poor historian.  Level 5 caveat applies given his intoxication.  Patient states that he had a lot to drink today, "I am just a boy in a man's body."  States that he drinks every 3-4 days.  He does not remember how he got here and does not remember falling or hitting his head.  States that he has no complaints.  He is slurring his speech.  He denies headache, visual disturbance, vomiting, numbness, weakness, chest pain, shortness of breath, abdominal pain.  States that he lives with his wife.  Past Medical History:  Diagnosis Date  . Arthritis   . 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 Diabetes mellitus   . DVT (deep venous thrombosis) ([place])   . Hepatitis    hepatitis c, finished harvoni tx 3 months ago  . Hypercholesterolemia   . Hypertension   . Sleep apnea    not currently using cpap, mask causing vertigo    Patient Active Problem List   Diagnosis Date Noted  . Varicose veins of left lower extremity with complications 74/07/8785  . Benign paroxysmal positional vertigo 11/08/2013  . RBBB 06/02/2009  . 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 DM 04/11/2009  . OBESITY 04/11/2009  . DEPRESSION 04/11/2009  . HYPERTENSION 04/11/2009  . DIVERTICULAR DISEASE 04/11/2009  . CHOLECYSTITIS 04/11/2009  . CHEST PAIN 04/11/2009  . ABDOMINAL PAIN 04/11/2009    Past Surgical History:  Procedure Laterality Date  . COLONOSCOPY WITH PROPOFOL N/A 02/19/2014   Procedure: COLONOSCOPY WITH PROPOFOL;  Surgeon: Garlan Fair, MD;   Location: [facility] END[facility];  Service: Endoscopy;  Laterality: N/A;  . ENDOVENOUS ABLATION SAPHENOUS VEIN W/ LASER Left 11/22/2017   endovenous laser ablation L SSV by Tinnie Gens MD   . [place] Right    QUADS  . PROSTATE SURGERY          Home Medications    Prior to Admission medications   Medication Sig Start Date End Date Taking? Authorizing Provider  aspirin EC 81 MG tablet Take 81 mg by mouth daily.    [provider]  atorvastatin (LIPITOR) 10 MG tablet  11/19/17   [provider]  cyclobenzaprine (FLEXERIL) 10 MG tablet Take 1 tablet (10 mg total) by mouth at bedtime as needed for muscle spasms. Patient not taking: Reported on 02/23/2018 03/22/17   Robinson, [place] N, PA-C  HYDROcodone-acetaminophen (NORCO/VICODIN) 5-325 MG tablet Take 1 tablet by mouth every 6 (six) hours as needed for severe pain. Patient not taking: Reported on 11/28/2017 04/27/17   Gaye Pollack, MD  ibuprofen (ADVIL,MOTRIN) 800 MG tablet Take 800 mg by mouth every 8 (eight) hours as needed.    [provider]  losartan (COZAAR) 25 MG tablet Take 25 mg by mouth daily.    [provider]  meloxicam (MOBIC) 15 MG tablet Take 15 mg by mouth daily.    [provider]  metFORMIN (GLUCOPHAGE) 1000 MG tablet Take 1,000 mg by mouth 2 (two) times daily  with a meal.    [provider]  Multiple Vitamin (MULTIVITAMIN WITH MINERALS) TABS tablet Take 1 tablet by mouth daily.    [provider]  [facility] VERIO test strip  11/18/17   [provider]  rosuvastatin (CRESTOR) 5 MG tablet Take 5 mg by mouth daily.    [provider]  traMADol (ULTRAM) 50 MG tablet Take by mouth every 12 (twelve) hours as needed.    [provider]  venlafaxine (EFFEXOR) 100 MG tablet Take 100 mg by mouth 2 (two) times daily.    [provider]    Family History Family History  Problem Relation Age of Onset  . Cancer Father        PROSTATE     Social History Social History   Tobacco Use  . Smoking status: Current Some Day Smoker    Packs/day: 0.50  . Smokeless tobacco: Never Used  . Tobacco comment: social smoking only  Substance Use Topics  . Alcohol use: No  . Drug use: No     Allergies   Patient has no known allergies.   Review of Systems Review of Systems  Constitutional: Negative for chills and fever.  HENT: Negative for facial swelling.   Eyes: Negative for visual disturbance.  Respiratory: Negative for shortness of breath.   Cardiovascular: Negative for chest pain.  Gastrointestinal: Negative for abdominal pain and vomiting.  Musculoskeletal: Negative for back pain and neck pain.  Skin: Negative for wound.  Neurological: Negative for headaches.  Psychiatric/Behavioral: Negative for agitation.  All other systems reviewed and are negative.    Physical Exam Updated Vital Signs BP (!) 147/73   Pulse 78   Temp 98.3 F (36.8 C) (Oral)   Resp 11   Wt 107 kg   SpO2 95%   BMI 34.84 kg/m   Physical Exam  Constitutional: He appears well-developed and well-nourished. No distress.  Patient intoxicated.  Slurring words and speaking very loudly.  No acute distress.  HENT:  Head: Normocephalic and atraumatic.  Mouth/Throat: Oropharynx is clear and moist.  No obvious scalp or face injury.  No hematoma or contusion over the face or head.  No raccoon eyes or battle sign.  Eyes: Pupils are equal, round, and reactive to light. Conjunctivae are normal. Right eye exhibits no discharge. Left eye exhibits no discharge.  Pupils 4 mm and equal.  Neck: Normal range of motion. Neck supple.  C-collar in place.  Cardiovascular: Normal rate, regular rhythm and intact distal pulses.  No murmur heard. Pulmonary/Chest: Effort normal and breath sounds normal. No stridor. No respiratory distress. He has no wheezes. He has no rales.  Abdominal: Soft. Bowel sounds are normal. There is no tenderness.  Neurological: He is  alert. Coordination normal.  Moving all extremities.  Strength 5/5 in bilateral upper and lower extremities including strong and equal grip strength and ankle dorsiflexion/plantarflexion.  Skin: Skin is warm and dry. He is not diaphoretic.  Psychiatric: He has a normal mood and affect. His behavior is normal.  Nursing note and vitals reviewed.    ED Treatments / Results  Labs (all labs ordered are listed, but only abnormal results are displayed) Labs Reviewed  BASIC METABOLIC PANEL - Abnormal; Notable for the following components:      Result Value   Glucose, Bld 157 (*)    BUN 25 (*)    Creatinine, Ser 3.35 (*)    GFR calc non Af Amer 18 (*)    GFR calc Af Wyvonnia Lora  21 (*)    Anion gap 18 (*)    All other components within normal limits  CBG MONITORING, ED - Abnormal; Notable for the following components:   Glucose-Capillary 151 (*)    All other components within normal limits  CBC    EKG None  Radiology Ct Head Wo Contrast  Result Date: 04/13/2018 CLINICAL DATA:  Fall, hit back of head. EXAM: CT HEAD WITHOUT CONTRAST CT CERVICAL SPINE WITHOUT CONTRAST TECHNIQUE: Multidetector CT imaging of the head and cervical spine was performed following the standard protocol without intravenous contrast. Multiplanar CT image reconstructions of the cervical spine were also generated. COMPARISON:  MRI 10/31/2013 FINDINGS: CT HEAD FINDINGS Brain: No acute intracranial abnormality. Specifically, no hemorrhage, hydrocephalus, mass lesion, acute infarction, or significant intracranial injury. Vascular: No hyperdense vessel or unexpected calcification. Skull: No acute calvarial abnormality. Sinuses/Orbits: Old left orbital medial wall blowout fracture. No acute findings. Other: None CT CERVICAL SPINE FINDINGS Alignment: Loss of cervical lordosis. Skull base and vertebrae: No acute fracture. No primary bone lesion or focal pathologic process. Soft tissues and spinal canal: No prevertebral fluid or swelling.  No visible canal hematoma. Disc levels: Disc space narrowing at C5-6 and C6-7. Mild bilateral degenerative facet disease, left greater than right. Upper chest: Negative Other: Negative IMPRESSION: No acute intracranial abnormality. Loss of cervical lordosis which may be related to muscle spasm. Spondylosis. No acute bony abnormality in the cervical spine. Electronically Signed   By: Rolm Baptise M.D.   On: 04/13/2018 21:23   Ct Cervical Spine Wo Contrast  Result Date: 04/13/2018 CLINICAL DATA:  Fall, hit back of head. EXAM: CT HEAD WITHOUT CONTRAST CT CERVICAL SPINE WITHOUT CONTRAST TECHNIQUE: Multidetector CT imaging of the head and cervical spine was performed following the standard protocol without intravenous contrast. Multiplanar CT image reconstructions of the cervical spine were also generated. COMPARISON:  MRI 10/31/2013 FINDINGS: CT HEAD FINDINGS Brain: No acute intracranial abnormality. Specifically, no hemorrhage, hydrocephalus, mass lesion, acute infarction, or significant intracranial injury. Vascular: No hyperdense vessel or unexpected calcification. Skull: No acute calvarial abnormality. Sinuses/Orbits: Old left orbital medial wall blowout fracture. No acute findings. Other: None CT CERVICAL SPINE FINDINGS Alignment: Loss of cervical lordosis. Skull base and vertebrae: No acute fracture. No primary bone lesion or focal pathologic process. Soft tissues and spinal canal: No prevertebral fluid or swelling. No visible canal hematoma. Disc levels: Disc space narrowing at C5-6 and C6-7. Mild bilateral degenerative facet disease, left greater than right. Upper chest: Negative Other: Negative IMPRESSION: No acute intracranial abnormality. Loss of cervical lordosis which may be related to muscle spasm. Spondylosis. No acute bony abnormality in the cervical spine. Electronically Signed   By: Rolm Baptise M.D.   On: 04/13/2018 21:23    Procedures Procedures (including critical care time)  Medications  Ordered in ED Medications  sodium chloride 0.9 % bolus 1,000 mL (1,000 mLs Intravenous New Bag/Given 04/14/18 0046)  sodium chloride 0.9 % bolus 1,000 mL (1,000 mLs Intravenous New Bag/Given 04/14/18 0045)     Initial Impression / Assessment and Plan / ED Course  I have reviewed the triage vital signs and the nursing notes.  Pertinent labs & imaging results that were available during my care of the patient were reviewed by me and considered in my medical decision making (see chart for details).    Presents via EMS after being found intoxicated with report of hitting his head against concrete.  On exam no obvious trauma.  He is slurring his speech and  wearing a c-collar.  Plan to get basic labs and CT head/cervical spine for further evaluation.  CT head without acute intracranial abnormality.  CT cervical spine without acute fracture or abnormality.  CBC unremarkable.  BMP is remarkable for elevated creatinine of 3.35.  Patient without history of kidney disease.  His creatinine was 0.8 1-year ago.  He also has an anion gap acidosis, likely alcoholic ketoacidosis.  Discussed this patient with Dr. Eulis Foster who recommends hydrating the patient with fluids and rechecking the creatinine.  If it is improved, he can be discharged home.  Will also check CK given unsure how long he was down before EMS arrived. If not, he will need hospital admission.  Signout at shift change given to Dr. Antony Odea for disposition.  Patient informed about plan.   Final Clinical Impressions(s) / ED Diagnoses   Final diagnoses:  None    ED Discharge Orders    None       Bernarda Caffey 04/14/18 0117    Daleen Bo, MD 04/14/18 [phone]

## 2018-04-14 DIAGNOSIS — S0990XA Unspecified injury of head, initial encounter: Secondary | ICD-10-CM | POA: Diagnosis not present

## 2018-04-14 LAB — BASIC METABOLIC PANEL
Anion gap: 9 (ref 5–15)
BUN: 22 mg/dL (ref 8–23)
CHLORIDE: 103 mmol/L (ref 98–111)
CO2: 27 mmol/L (ref 22–32)
Calcium: 8.3 mg/dL — ABNORMAL LOW (ref 8.9–10.3)
Creatinine, Ser: 2.48 mg/dL — ABNORMAL HIGH (ref 0.61–1.24)
GFR calc Af Amer: 30 mL/min — ABNORMAL LOW (ref >60)
GFR calc non Af Amer: 26 mL/min — ABNORMAL LOW (ref >60)
GLUCOSE: 113 mg/dL — AB (ref 70–99)
POTASSIUM: 3.7 mmol/L (ref 3.5–5.1)
Sodium: 139 mmol/L (ref 135–145)

## 2018-04-14 LAB — CK: CK TOTAL: 351 U/L (ref 49–397)

## 2018-04-14 MED ORDER — SODIUM CHLORIDE 0.9 % IV BOLUS
1000.0000 mL | Freq: Once | INTRAVENOUS | Status: AC
  Administered 2018-04-14: 1000 mL via INTRAVENOUS

## 2018-04-14 NOTE — ED Notes (Signed)
Pt tolerating PO fluids

## 2018-04-14 NOTE — ED Notes (Signed)
Pt states it is time for his daily bp medication

## 2018-04-14 NOTE — ED Provider Notes (Signed)
I assumed care of this patient from Dr. Eulis Foster and Geanie Kenning, [place]  at [facility].  Please see their note for further details of Hx, PE.  Briefly patient is a 62 y.o. male who presented with fall while intoxicated found to have AKI thought to be related to dehydration. Pt has MTF.  Current plan is to hydrate with IVF and reassess.  CK negative. Repeat BMP with significant improvement. Recommended close PCP follow up for repeat BMP w/in 1 week.  The patient is safe for discharge with strict return precautions.  Disposition: Discharge  Condition: Good  I have discussed the results, Dx and Tx plan with the patient who expressed understanding and agree(s) with the plan. Discharge instructions discussed at great length. The patient was given strict return precautions who verbalized understanding of the instructions. No further questions at time of discharge.    ED Discharge Orders    None       Follow Up: [name], MD [facility] [employer] [address] [place] [address] [phone]   in 5-7 days for repeat renal function test        Fatima Blank, MD 04/14/18 [phone]

## 2018-04-24 ENCOUNTER — Emergency Department (HOSPITAL_COMMUNITY): Payer: Medicare Other

## 2018-04-24 ENCOUNTER — Encounter (HOSPITAL_COMMUNITY): Payer: Self-pay

## 2018-04-24 ENCOUNTER — Emergency Department (HOSPITAL_COMMUNITY)
Admission: EM | Admit: 2018-04-24 | Discharge: 2018-04-24 | Discharge status: Against Medical Advice | Payer: Medicare Other | Attending: Emergency Medicine | Admitting: Emergency Medicine

## 2018-04-24 ENCOUNTER — Other Ambulatory Visit: Payer: Self-pay

## 2018-04-24 DIAGNOSIS — G8929 Other chronic pain: Secondary | ICD-10-CM | POA: Insufficient documentation

## 2018-04-24 DIAGNOSIS — Z7984 Long term (current) use of oral hypoglycemic drugs: Secondary | ICD-10-CM | POA: Diagnosis not present

## 2018-04-24 DIAGNOSIS — Z86718 Personal history of other venous thrombosis and embolism: Secondary | ICD-10-CM | POA: Diagnosis not present

## 2018-04-24 DIAGNOSIS — Z87891 Personal history of nicotine dependence: Secondary | ICD-10-CM | POA: Insufficient documentation

## 2018-04-24 DIAGNOSIS — M25561 Pain in right knee: Secondary | ICD-10-CM | POA: Diagnosis present

## 2018-04-24 DIAGNOSIS — Z5320 Procedure and treatment not carried out because of patient's decision for unspecified reasons: Secondary | ICD-10-CM | POA: Insufficient documentation

## 2018-04-24 DIAGNOSIS — I10 Essential (primary) hypertension: Secondary | ICD-10-CM | POA: Diagnosis not present

## 2018-04-24 DIAGNOSIS — Z79899 Other long term (current) drug therapy: Secondary | ICD-10-CM | POA: Insufficient documentation

## 2018-04-24 DIAGNOSIS — E119 Type 2 diabetes mellitus without complications: Secondary | ICD-10-CM | POA: Diagnosis not present

## 2018-04-24 DIAGNOSIS — Z8546 Personal history of malignant neoplasm of prostate: Secondary | ICD-10-CM | POA: Diagnosis not present

## 2018-04-24 MED ORDER — LIDOCAINE 5 % EX PTCH
1.0000 | MEDICATED_PATCH | CUTANEOUS | Status: DC
  Administered 2018-04-24: 1 via TRANSDERMAL
  Filled 2018-04-24: qty 1

## 2018-04-24 NOTE — ED Notes (Signed)
Pt reported that he had to leave to catch the bus. Provider aware. Pt left without signing any AMA forms or obtaining any paperwork from the doctor. Unable to obtain another set of vitals as pt was already walking away from his bed.

## 2018-04-24 NOTE — ED Provider Notes (Signed)
[facility] DEPT Provider Note   CSN: [id number] Arrival date & time: 04/24/18  0947     History   Chief Complaint Chief Complaint  Patient presents with  . Knee Pain    HPI Todd [name] is a 62 y.o. male.  The history is provided by the patient.  Knee Pain   This is a chronic problem. The current episode started more than 2 days ago. The problem occurs every several days. The problem has not changed since onset.The pain is present in the right knee. The quality of the pain is described as aching. The pain is at a severity of 2/10. The pain is mild. Associated symptoms include limited range of motion (chronic). Pertinent negatives include no numbness, no stiffness, no tingling and no itching. The symptoms are aggravated by activity. He has tried nothing (uses cane) for the symptoms. The treatment provided mild relief. There has been a history of trauma (Hx of quad rupture on right side). Family history is significant for no rheumatoid arthritis and no gout.    Past Medical History:  Diagnosis Date  . Arthritis   . Cancer ([place]) 38yrs ago   PROSTATE  . Diabetes mellitus   . DVT (deep venous thrombosis) ([place])   . Hepatitis    hepatitis c, finished harvoni tx 3 months ago  . Hypercholesterolemia   . Hypertension   . Sleep apnea    not currently using cpap, mask causing vertigo    Patient Active Problem List   Diagnosis Date Noted  . Varicose veins of left lower extremity with complications 40/97/3532  . Benign paroxysmal positional vertigo 11/08/2013  . RBBB 06/02/2009  . 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 DM 04/11/2009  . OBESITY 04/11/2009  . DEPRESSION 04/11/2009  . HYPERTENSION 04/11/2009  . DIVERTICULAR DISEASE 04/11/2009  . CHOLECYSTITIS 04/11/2009  . CHEST PAIN 04/11/2009  . ABDOMINAL PAIN 04/11/2009    Past Surgical History:  Procedure Laterality Date  . COLONOSCOPY WITH PROPOFOL N/A 02/19/2014   Procedure: COLONOSCOPY WITH  PROPOFOL;  Surgeon: Garlan Fair, MD;  Location: [facility] END[facility]COPY;  Service: Endoscopy;  Laterality: N/A;  . ENDOVENOUS ABLATION SAPHENOUS VEIN W/ LASER Left 11/22/2017   endovenous laser ablation L SSV by Tinnie Gens MD   . [place] Right    QUADS  . PROSTATE SURGERY          Home Medications    Prior to Admission medications   Medication Sig Start Date End Date Taking? Authorizing Provider  aspirin EC 81 MG tablet Take 81 mg by mouth daily.    [provider]  atorvastatin (LIPITOR) 10 MG tablet  11/19/17   [provider]  cyclobenzaprine (FLEXERIL) 10 MG tablet Take 1 tablet (10 mg total) by mouth at bedtime as needed for muscle spasms. Patient not taking: Reported on 02/23/2018 03/22/17   Robinson, [place] N, PA-C  HYDROcodone-acetaminophen (NORCO/VICODIN) 5-325 MG tablet Take 1 tablet by mouth every 6 (six) hours as needed for severe pain. Patient not taking: Reported on 11/28/2017 04/27/17   Gaye Pollack, MD  ibuprofen (ADVIL,MOTRIN) 800 MG tablet Take 800 mg by mouth every 8 (eight) hours as needed.    [provider]  losartan (COZAAR) 25 MG tablet Take 25 mg by mouth daily.    [provider]  meloxicam (MOBIC) 15 MG tablet Take 15 mg by mouth daily.    [provider]  metFORMIN (GLUCOPHAGE) 1000 MG tablet Take 1,000 mg by mouth 2 (  two) times daily with a meal.    [provider]  Multiple Vitamin (MULTIVITAMIN WITH MINERALS) TABS tablet Take 1 tablet by mouth daily.    [provider]  [facility] VERIO test strip  11/18/17   [provider]  rosuvastatin (CRESTOR) 5 MG tablet Take 5 mg by mouth daily.    [provider]  traMADol (ULTRAM) 50 MG tablet Take by mouth every 12 (twelve) hours as needed.    [provider]  venlafaxine (EFFEXOR) 100 MG tablet Take 100 mg by mouth 2 (two) times daily.    [provider]    Family History Family History  Problem Relation Age of Onset    . Cancer Father        PROSTATE    Social History Social History   Tobacco Use  . Smoking status: Current Some Day Smoker    Packs/day: 0.50  . Smokeless tobacco: Never Used  . Tobacco comment: social smoking only  Substance Use Topics  . Alcohol use: Yes    Comment: every few days  . 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   Review of Systems Review of Systems  Constitutional: Negative for chills and fever.  HENT: Negative for ear pain and sore throat.   Eyes: Negative for pain and visual disturbance.  Respiratory: Negative for cough and shortness of breath.   Cardiovascular: Negative for chest pain and palpitations.  Gastrointestinal: Negative for abdominal pain and vomiting.  Genitourinary: Negative for dysuria and hematuria.  Musculoskeletal: Positive for arthralgias and gait problem. Negative for back pain and stiffness.  Skin: Negative for color change, itching and rash.  Neurological: Negative for tingling, seizures, syncope and numbness.  All other systems reviewed and are negative.    Physical Exam Updated Vital Signs  ED Triage Vitals  Enc Vitals Group     BP 04/24/18 1002 (!) 178/104     Pulse Rate 04/24/18 1002 86     Resp 04/24/18 1002 20     Temp 04/24/18 1002 98.2 F (36.8 C)     Temp Source 04/24/18 1002 Oral     SpO2 04/24/18 1002 95 %     Weight 04/24/18 1014 232 lb (105.2 kg)     Height 04/24/18 1014 5\' 9"  (1.753 m)     Head Circumference --      Peak Flow --      Pain Score 04/24/18 1012 10     Pain Loc --      Pain Edu? --      Excl. in [place]? --     Physical Exam  Constitutional: He is oriented to person, place, and time. He appears well-developed and well-nourished.  HENT:  Head: Normocephalic and atraumatic.  Eyes: Pupils are equal, round, and reactive to light. Conjunctivae are normal.  Neck: Neck supple.  Cardiovascular: Normal rate, regular rhythm, normal heart sounds  and intact distal pulses.  No murmur heard. Pulmonary/Chest: Effort normal and breath sounds normal. No respiratory distress.  Abdominal: Soft. There is no tenderness.  Musculoskeletal: He exhibits tenderness (with ROM of right knee). He exhibits no edema or deformity.  Decreased range of motion in the right lower extremity secondary to pain, no obvious deformities, 5+/5 strength in bilateral lower extremities, normal sensation  Neurological: He is alert and oriented to person, place, and time. No cranial nerve deficit or sensory deficit. He exhibits normal muscle  tone. Coordination normal.  Skin: Skin is warm and dry. Capillary refill takes less than 2 seconds.  Psychiatric: He has a normal mood and affect.  Nursing note and vitals reviewed.    ED Treatments / Results  Labs (all labs ordered are listed, but only abnormal results are displayed) Labs Reviewed - No data to display  EKG None  Radiology Dg Knee Complete 4 Views Right  Result Date: 04/24/2018 CLINICAL DATA:  Knee pain post fall. EXAM: RIGHT KNEE - COMPLETE 4+ VIEW COMPARISON:  None. FINDINGS: There is inferior displacement of the patella. Postsurgical changes with coarse calcifications within the quadriceps muscle. Anterior prepatellar soft tissue swelling. No joint effusion seen. Meniscal calcifications noted. IMPRESSION: Inferior displacement of the patella. Prepatellar soft tissue swelling. Postsurgical/posttraumatic changes within the distal quadriceps muscle. Electronically Signed   By: Fidela Salisbury M.D.   On: 04/24/2018 11:39    Procedures Procedures (including critical care time)  Medications Ordered in ED Medications - No data to display   Initial Impression / Assessment and Plan / ED Course  I have reviewed the triage vital signs and the nursing notes.  Pertinent labs & imaging results that were available during my care of the patient were reviewed by me and considered in my medical decision making (see  chart for details).     Chimaobi Casebolt is a 61 year old male with history of right quadriceps tendon rupture, arthritis, high cholesterol, hypertension who presents to the ED with right knee pain.  Patient states that his right knee has been giving out on him last several days and has had some falls but no direct trauma to the right knee or other extremity.  He ambulates with a cane but continues to have issues at times.  Patient is able to ambulate with a cane upon my evaluation.  Overall is neurovascularly intact on exam.  No obvious swelling.  Has fairly good range of motion of the right lower extremity and appears likely to be baseline.  X-ray showed inferior displacement of the patella with some soft tissue swelling but otherwise unremarkable x-ray.    Prior to imaging results patient left AMA/eloped but prior to patient leaving he did state that he would follow-up with his orthopedic doctor. He did not want to wait for imaging interpretation by radiology due to needing to be elsewhere.  Patient with no obvious deformities on exam and suspect that this is likely chronic for the patient.  He appears to have good leg extension on exam.  Patient does not want to wait for imaging results/possible ortho eval. Told to return to ED if changes his mind.   Final Clinical Impressions(s) / ED Diagnoses   Final diagnoses:  Chronic pain of right knee    ED Discharge Orders    None       Lennice Sites, DO 04/24/18 1620

## 2018-04-24 NOTE — ED Triage Notes (Signed)
Chronic right knee pain that is getting worse starting 2 days ago. Pt states knee has been giving out recently leading to falls. States Scratches, bruising and swelling to same knee.

## 2018-05-03 ENCOUNTER — Ambulatory Visit: Payer: Medicare Other | Admitting: Surgery

## 2018-05-03 ENCOUNTER — Inpatient Hospital Stay: Admission: RE | Admit: 2018-05-03 | Payer: Medicare Other | Source: Ambulatory Visit

## 2018-06-01 ENCOUNTER — Ambulatory Visit (INDEPENDENT_AMBULATORY_CARE_PROVIDER_SITE_OTHER): Payer: Medicare Other | Admitting: Vascular Surgery

## 2018-06-01 ENCOUNTER — Encounter: Payer: Self-pay | Admitting: Vascular Surgery

## 2018-06-01 ENCOUNTER — Ambulatory Visit (HOSPITAL_COMMUNITY)
Admission: RE | Admit: 2018-06-01 | Discharge: 2018-06-01 | Disposition: A | Discharge status: Home / Self Care | Payer: Medicare Other | Source: Ambulatory Visit | Attending: Vascular Surgery | Admitting: Vascular Surgery

## 2018-06-01 ENCOUNTER — Other Ambulatory Visit: Payer: Self-pay

## 2018-06-01 VITALS — BP 157/96 | HR 86 | Temp 98.2°F | Resp 18 | Ht 69.0 in | Wt 239.8 lb

## 2018-06-01 DIAGNOSIS — I83892 Varicose veins of left lower extremities with other complications: Secondary | ICD-10-CM | POA: Diagnosis not present

## 2018-06-01 DIAGNOSIS — I83813 Varicose veins of bilateral lower extremities with pain: Secondary | ICD-10-CM

## 2018-06-01 NOTE — Progress Notes (Signed)
Patient is a 62 year old male who returns for follow-up today.  He previous had laser ablation of the left lesser saphenous vein by my partner Dr. Kellie Simmering.  He has improved his leg swelling symptoms on the left side.  He returns today for further follow-up regarding a possible intervention on his right leg.  He has a remote history of DVT was treated with Coumadin in the past but currently does not require anticoagulation.  He continues to be compliant with his compression stockings.  He has difficulty walking at times because of degenerative joint disease in his right knee.  The knee is quite unstable and he almost fell moving from the bed to the standing position during his office visit today.  Review of systems: He has degenerative changes and pain in his right knee.  He denies shortness of breath.  He does have shortness of breath with exertion.  He denies chest pain.  Social History   Socioeconomic History  . Marital status: Single    Spouse name: Not on file  . Number of children: 2  . Years of education: 12th  . Highest education level: Not on file  Occupational History  . Occupation: post office  Social Needs  . Financial resource strain: Not on file  . Food insecurity:    Worry: Not on file    Inability: Not on file  . Transportation needs:    Medical: Not on file    Non-medical: Not on file  Tobacco Use  . Smoking status: Current Some Day Smoker    Packs/day: 0.50  . Smokeless tobacco: Never Used  . Tobacco comment: social smoking only  Substance and Sexual Activity  . Alcohol use: Yes    Comment: every few days  . 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 Sexual activity: Not on file  Lifestyle  . Physical activity:    Days per week: Not on file    Minutes per session: Not on file  . Stress: Not on file  Relationships  . Social connections:    Talks on phone: Not on file    Gets together: Not on file    Attends religious service: Not on file    Active  member of club or organization: Not on file    Attends meetings of clubs or organizations: Not on file    Relationship status: Not on file  . Intimate partner violence:    Fear of current or ex partner: Not on file    Emotionally abused: Not on file    Physically abused: Not on file    Forced sexual activity: Not on file  Other Topics Concern  . Not on file  Social History Narrative   Patient lives at home alone.Marland KitchenMarland KitchenDrinks coffee daily     Family History  Problem Relation Age of Onset  . Cancer Father        PROSTATE    Current Outpatient Medications on File Prior to Visit  Medication Sig Dispense Refill  . aspirin EC 81 MG tablet Take 81 mg by mouth daily.    Marland Kitchen atorvastatin (LIPITOR) 10 MG tablet   3  . ibuprofen (ADVIL,MOTRIN) 800 MG tablet Take 800 mg by mouth every 8 (eight) hours as needed.    Marland Kitchen losartan (COZAAR) 25 MG tablet Take 25 mg by mouth daily.    . meloxicam (MOBIC) 15 MG tablet Take 15 mg by mouth daily.    . metFORMIN (GLUCOPHAGE) 1000 MG tablet  Take 1,000 mg by mouth 2 (two) times daily with a meal.    . Multiple Vitamin (MULTIVITAMIN WITH MINERALS) TABS tablet Take 1 tablet by mouth daily.    Glory Rosebush VERIO test strip   11  . rosuvastatin (CRESTOR) 5 MG tablet Take 5 mg by mouth daily.    . traMADol (ULTRAM) 50 MG tablet Take by mouth every 12 (twelve) hours as needed.    . venlafaxine (EFFEXOR) 100 MG tablet Take 100 mg by mouth 2 (two) times daily.    . cyclobenzaprine (FLEXERIL) 10 MG tablet Take 1 tablet (10 mg total) by mouth at bedtime as needed for muscle spasms. (Patient not taking: Reported on 02/23/2018) 10 tablet 0  . HYDROcodone-acetaminophen (NORCO/VICODIN) 5-325 MG tablet Take 1 tablet by mouth every 6 (six) hours as needed for severe pain. (Patient not taking: Reported on 11/28/2017) 20 tablet 0   Current Facility-Administered Medications on File Prior to Visit  Medication Dose Route Frequency Provider Last Rate Last Dose  . triamcinolone acetonide  (KENALOG) 10 MG/ML injection 10 mg  10 mg Other Once Landis Martins, DPM      . triamcinolone acetonide (KENALOG) 10 MG/ML injection 10 mg  10 mg Other Once Landis Martins, DPM        No Known Allergies  Physical exam:  Vitals:   06/01/18 1052  BP: (!) 157/96  Pulse: 86  Resp: 18  Temp: 98.2 F (36.8 C)  TempSrc: Oral  SpO2: 96%  Weight: 239 lb 12.8 oz (108.8 kg)  Height: 5\' 9"  (1.753 m)    Extremities: Trace edema bilateral lower extremities few scattered varicosities spider type along the right inner thigh and lateral calf unchanged from July 2019  Vascular: 2+ dorsalis pedis pulses  Data: Patient had a duplex ultrasound for reflux in the right leg today.  This showed no evidence of reflux in the right greater saphenous vein and a vein diameter mostly less than 2 mm.  The lesser saphenous vein did have some evidence of reflux but again was a small vessel only 2-1/2 mm in diameter  Assessment: #1 severe degenerative joint disease right knee making gait unstable necessitating scat transfer when possible  #2 venous reflux right lesser saphenous vein no reflux right greater saphenous vein mainstay of therapy at this point for both lower extremities will be compression therapy  3.  Status post laser ablation left greater saphenous vein with improvement of symptoms  The patient will follow-up with [place] on an as-needed basis.  Ruta Hinds, MD [facility] Office: [phone] Pager: [phone]

## 2018-06-07 ENCOUNTER — Encounter: Payer: Self-pay | Admitting: Surgery

## 2018-06-07 ENCOUNTER — Ambulatory Visit (INDEPENDENT_AMBULATORY_CARE_PROVIDER_SITE_OTHER): Payer: Medicare Other | Admitting: Surgery

## 2018-06-07 ENCOUNTER — Ambulatory Visit
Admission: RE | Admit: 2018-06-07 | Discharge: 2018-06-07 | Disposition: A | Discharge status: Home / Self Care | Payer: Medicare Other | Source: Ambulatory Visit | Attending: Surgery | Admitting: Surgery

## 2018-06-07 ENCOUNTER — Other Ambulatory Visit: Payer: Self-pay

## 2018-06-07 VITALS — BP 161/97 | HR 86 | Resp 18 | Ht 69.0 in | Wt 236.0 lb

## 2018-06-07 DIAGNOSIS — I712 Thoracic aortic aneurysm, without rupture: Secondary | ICD-10-CM

## 2018-06-07 DIAGNOSIS — I7121 Aneurysm of the ascending aorta, without rupture: Secondary | ICD-10-CM

## 2018-06-07 MED ORDER — IOPAMIDOL (ISOVUE-370) INJECTION 76%
75.0000 mL | Freq: Once | INTRAVENOUS | Status: AC | PRN
  Administered 2018-06-07: 75 mL via INTRAVENOUS

## 2018-06-07 NOTE — Progress Notes (Signed)
HPI:  The patient returns today for follow-up of a 4.0 cm fusiform ascending aortic aneurysm which had been noted on a CT scan of the chest done in August 2018 after he presented with chest pain following a fall and striking his chest.  He also had fractures of the right second through seventh ribs which were causing him a lot of pain.  When I saw him in consultation on 04/27/2017 I reviewed his x-ray file and he had had a previous CT scan of the chest in 2010 and his aorta was about 40 mm at that time.  Since I last saw him he said that he has been feeling fairly well.  He has been taking his blood pressure medication.  He continues to smoke.  He has had some intermittent right chest wall pain in the area of his previous rib fractures.  Current Outpatient Medications  Medication Sig Dispense Refill  . aspirin EC 81 MG tablet Take 81 mg by mouth daily.    Marland Kitchen atorvastatin (LIPITOR) 10 MG tablet   3  . losartan (COZAAR) 50 MG tablet Take 50 mg by mouth daily.    . metFORMIN (GLUCOPHAGE) 1000 MG tablet Take 1,000 mg by mouth 2 (two) times daily with a meal.    . Multiple Vitamin (MULTIVITAMIN WITH MINERALS) TABS tablet Take 1 tablet by mouth daily.    Glory Rosebush VERIO test strip   11  . venlafaxine (EFFEXOR) 100 MG tablet Take 100 mg by mouth 2 (two) times daily.     Current Facility-Administered Medications  Medication Dose Route Frequency Provider Last Rate Last Dose  . triamcinolone acetonide (KENALOG) 10 MG/ML injection 10 mg  10 mg Other Once Mills, [place], DPM      . triamcinolone acetonide (KENALOG) 10 MG/ML injection 10 mg  10 mg Other Once Landis Martins, DPM         Physical Exam: BP (!) 161/97 (BP Location: Right Arm, Patient Position: Sitting, Cuff Size: Large)   Pulse 86   Resp 18   Ht 5\' 9"  (1.753 m)   Wt 236 lb (107 kg)   SpO2 94% Comment: RA  BMI 34.85 kg/m  He looks well. Cardiac exam shows a regular rate and rhythm with normal heart sounds.  There is no  murmur. Lung exam is clear. There is no chest wall deformity and no tenderness.  Diagnostic Tests:  CLINICAL DATA:  Follow-up ascending aortic aneurysm. Right chest pain. History of prostate cancer.  Creatinine was obtained on site at [place] at [address]  Results: Creatinine 1.0 mg/dL.  EXAM: CT ANGIOGRAPHY CHEST WITH CONTRAST  TECHNIQUE: Multidetector CT imaging of the chest was performed using the standard protocol during bolus administration of intravenous contrast. Multiplanar CT image reconstructions and MIPs were obtained to evaluate the vascular anatomy.  CONTRAST:  84mL ISOVUE-370 IOPAMIDOL (ISOVUE-370) INJECTION 76%  COMPARISON:  03/22/2017  FINDINGS: Cardiovascular: The ascending aorta measures up to 3.8 cm in diameter, unchanged. The transverse and descending thoracic aorta are normal in caliber. The heart is normal in size. There is no pericardial effusion.  Mediastinum/Nodes: No enlarged axillary or mediastinal lymph nodes. Mildly enlarged right hilar lymph node on the prior study has decreased in size, now 10 mm in short axis. Unremarkable esophagus and included thyroid.  Lungs/Pleura: No pleural effusion or pneumothorax. Scattered ground-glass opacities throughout both lungs and scattered areas of mild subpleural reticulation do not appear significantly changed allowing for increased motion artifact on  the prior study. No mass.  Upper Abdomen: Cholecystectomy. Similar appearance of small low-density bilateral renal lesions likely representing cysts.  Musculoskeletal: Progressive interval healing of right anterior second through seventh rib fractures. New, healed left anterior second through sixth rib fractures. Thoracic spondylosis.  Review of the MIP images confirms the above findings.  IMPRESSION: 1. Unchanged borderline dilatation of the ascending aorta with maximal diameter of 3.8 cm. 2. Unchanged scattered  ground-glass opacities throughout both lungs, nonspecific but may reflect a chronic inflammatory process such as nonspecific interstitial pneumonia, hypersensitivity pneumonitis, or respiratory bronchiolitis.   Electronically Signed   By: Logan Bores M.D.   On: 06/07/2018 11:12  Impression:  The CT of the chest today shows mild dilation of the ascending aorta measured at 3.8 cm which is slightly smaller than the 4.0 cm measurement from a year ago.  It is essentially unchanged dating back to his CT scan in 2010.  I reviewed the CT images with him and answered his questions.  This is really mild enlargement of the aorta and I think the most important thing for him is having good blood pressure control.  I discussed this with him today including the importance of smoking cessation, regular physical activity, weight loss, and dietary sodium restriction.  He said that his primary physician had increased his Cozaar from 25 mg to 50 mg and I suspect that he will probably need additional antihypertensive therapy.  He is going to discuss that with Dr. Delfina Redwood.  Plan:  He will discuss his blood pressure control with Dr. Delfina Redwood and I will plan to see him back in 2 years with a CTA of the chest.  I spent 15 minutes performing this established patient evaluation and > 50% of this time was spent face to face counseling and coordinating the care of this patient's aortic aneurysm.    Gaye Pollack, MD [facility] [phone]

## 2018-06-26 ENCOUNTER — Ambulatory Visit (HOSPITAL_COMMUNITY)
Admission: RE | Admit: 2018-06-26 | Discharge: 2018-06-26 | Disposition: A | Discharge status: Home / Self Care | Payer: Medicare Other | Source: Home / Self Care | Attending: Psychiatry | Admitting: Psychiatry

## 2018-06-26 ENCOUNTER — Encounter (HOSPITAL_COMMUNITY): Payer: Self-pay | Admitting: *Deleted

## 2018-06-26 ENCOUNTER — Inpatient Hospital Stay (HOSPITAL_COMMUNITY)
Admission: AD | Admit: 2018-06-26 | Discharge: 2018-07-04 | DRG: 885 | Disposition: A | Discharge status: Home / Self Care | Payer: Medicare Other | Source: Intra-hospital | Attending: Psychiatry | Admitting: Psychiatry

## 2018-06-26 ENCOUNTER — Other Ambulatory Visit: Payer: Self-pay

## 2018-06-26 ENCOUNTER — Emergency Department (HOSPITAL_COMMUNITY)
Admission: EM | Admit: 2018-06-26 | Discharge: 2018-06-26 | Disposition: A | Discharge status: Other Acute Inpatient Hospital | Payer: Medicare Other | Attending: Emergency Medicine | Admitting: Emergency Medicine

## 2018-06-26 ENCOUNTER — Encounter (HOSPITAL_COMMUNITY): Payer: Self-pay | Admitting: Emergency Medicine

## 2018-06-26 ENCOUNTER — Emergency Department (HOSPITAL_COMMUNITY): Payer: Medicare Other

## 2018-06-26 DIAGNOSIS — E7800 Pure hypercholesterolemia, unspecified: Secondary | ICD-10-CM | POA: Insufficient documentation

## 2018-06-26 DIAGNOSIS — F1120 Opioid dependence, uncomplicated: Secondary | ICD-10-CM | POA: Diagnosis not present

## 2018-06-26 DIAGNOSIS — G4700 Insomnia, unspecified: Secondary | ICD-10-CM | POA: Diagnosis not present

## 2018-06-26 DIAGNOSIS — Z8546 Personal history of malignant neoplasm of prostate: Secondary | ICD-10-CM | POA: Insufficient documentation

## 2018-06-26 DIAGNOSIS — E119 Type 2 diabetes mellitus without complications: Secondary | ICD-10-CM | POA: Insufficient documentation

## 2018-06-26 DIAGNOSIS — F1020 Alcohol dependence, uncomplicated: Secondary | ICD-10-CM | POA: Diagnosis present

## 2018-06-26 DIAGNOSIS — F1123 Opioid dependence with withdrawal: Secondary | ICD-10-CM | POA: Diagnosis present

## 2018-06-26 DIAGNOSIS — R45851 Suicidal ideations: Secondary | ICD-10-CM | POA: Diagnosis present

## 2018-06-26 DIAGNOSIS — F32A Depression, unspecified: Secondary | ICD-10-CM

## 2018-06-26 DIAGNOSIS — F333 Major depressive disorder, recurrent, severe with psychotic symptoms: Secondary | ICD-10-CM

## 2018-06-26 DIAGNOSIS — I10 Essential (primary) hypertension: Secondary | ICD-10-CM | POA: Diagnosis present

## 2018-06-26 DIAGNOSIS — E785 Hyperlipidemia, unspecified: Secondary | ICD-10-CM | POA: Diagnosis present

## 2018-06-26 DIAGNOSIS — Z7982 Long term (current) use of aspirin: Secondary | ICD-10-CM

## 2018-06-26 DIAGNOSIS — G4730 Sleep apnea, unspecified: Secondary | ICD-10-CM

## 2018-06-26 DIAGNOSIS — Z7984 Long term (current) use of oral hypoglycemic drugs: Secondary | ICD-10-CM | POA: Diagnosis not present

## 2018-06-26 DIAGNOSIS — F332 Major depressive disorder, recurrent severe without psychotic features: Secondary | ICD-10-CM | POA: Diagnosis present

## 2018-06-26 DIAGNOSIS — F17210 Nicotine dependence, cigarettes, uncomplicated: Secondary | ICD-10-CM | POA: Insufficient documentation

## 2018-06-26 DIAGNOSIS — F1010 Alcohol abuse, uncomplicated: Secondary | ICD-10-CM

## 2018-06-26 DIAGNOSIS — Z79899 Other long term (current) drug therapy: Secondary | ICD-10-CM | POA: Insufficient documentation

## 2018-06-26 DIAGNOSIS — F329 Major depressive disorder, single episode, unspecified: Secondary | ICD-10-CM | POA: Insufficient documentation

## 2018-06-26 DIAGNOSIS — F17200 Nicotine dependence, unspecified, uncomplicated: Secondary | ICD-10-CM | POA: Insufficient documentation

## 2018-06-26 DIAGNOSIS — F419 Anxiety disorder, unspecified: Secondary | ICD-10-CM | POA: Diagnosis not present

## 2018-06-26 LAB — COMPREHENSIVE METABOLIC PANEL
ALK PHOS: 65 U/L (ref 38–126)
ALT: 29 U/L (ref 0–44)
ANION GAP: 9 (ref 5–15)
AST: 26 U/L (ref 15–41)
Albumin: 4.1 g/dL (ref 3.5–5.0)
BILIRUBIN TOTAL: 0.6 mg/dL (ref 0.3–1.2)
BUN: 19 mg/dL (ref 8–23)
CALCIUM: 9.6 mg/dL (ref 8.9–10.3)
CO2: 27 mmol/L (ref 22–32)
Chloride: 104 mmol/L (ref 98–111)
Creatinine, Ser: 0.91 mg/dL (ref 0.61–1.24)
GFR calc non Af Amer: >60 mL/min (ref >60)
Glucose, Bld: 81 mg/dL (ref 70–99)
Potassium: 3.9 mmol/L (ref 3.5–5.1)
SODIUM: 140 mmol/L (ref 135–145)
TOTAL PROTEIN: 7.5 g/dL (ref 6.5–8.1)

## 2018-06-26 LAB — CBC
HCT: 40.9 % (ref 39.0–52.0)
Hemoglobin: 13 g/dL (ref 13.0–17.0)
MCH: 32.4 pg (ref 26.0–34.0)
MCHC: 31.8 g/dL (ref 30.0–36.0)
MCV: 102 fL — ABNORMAL HIGH (ref 80.0–100.0)
Platelets: 182 10*3/uL (ref 150–400)
RBC: 4.01 MIL/uL — ABNORMAL LOW (ref 4.22–5.81)
RDW: 13.2 % (ref 11.5–15.5)
WBC: 6.3 10*3/uL (ref 4.0–10.5)
nRBC: 0 % (ref 0.0–0.2)

## 2018-06-26 LAB — RAPID URINE DRUG SCREEN, HOSP PERFORMED
Amphetamines: NOT DETECTED
Barbiturates: NOT DETECTED
Benzodiazepines: NOT DETECTED
Cocaine: NOT DETECTED
Opiates: NOT DETECTED
Tetrahydrocannabinol: NOT DETECTED

## 2018-06-26 LAB — ACETAMINOPHEN LEVEL: Acetaminophen (Tylenol), Serum: <10 ug/mL — ABNORMAL LOW (ref 10–30)

## 2018-06-26 LAB — ETHANOL: Alcohol, Ethyl (B): <10 mg/dL (ref <10)

## 2018-06-26 LAB — SALICYLATE LEVEL: Salicylate Lvl: <7 mg/dL (ref 2.8–30.0)

## 2018-06-26 MED ORDER — LORAZEPAM 1 MG PO TABS
2.0000 mg | ORAL_TABLET | Freq: Once | ORAL | Status: AC
  Administered 2018-06-26: 2 mg via ORAL
  Filled 2018-06-26: qty 2

## 2018-06-26 MED ORDER — NICOTINE 21 MG/24HR TD PT24
21.0000 mg | MEDICATED_PATCH | Freq: Every day | TRANSDERMAL | Status: DC
  Administered 2018-06-27 – 2018-07-04 (×8): 21 mg via TRANSDERMAL
  Filled 2018-06-26 (×11): qty 1

## 2018-06-26 MED ORDER — LORAZEPAM 1 MG PO TABS
1.0000 mg | ORAL_TABLET | Freq: Two times a day (BID) | ORAL | Status: AC
  Administered 2018-06-29 – 2018-06-30 (×2): 1 mg via ORAL
  Filled 2018-06-26: qty 1

## 2018-06-26 MED ORDER — LOSARTAN POTASSIUM 50 MG PO TABS: 50.0000 mg | ORAL_TABLET | Freq: Every day | ORAL | Status: DC

## 2018-06-26 MED ORDER — LOPERAMIDE HCL 2 MG PO CAPS: 2.0000 mg | ORAL_CAPSULE | ORAL | Status: AC | PRN

## 2018-06-26 MED ORDER — TRAZODONE HCL 50 MG PO TABS
50.0000 mg | ORAL_TABLET | Freq: Every evening | ORAL | Status: DC | PRN
  Administered 2018-06-26: 50 mg via ORAL
  Filled 2018-06-26: qty 1

## 2018-06-26 MED ORDER — LORAZEPAM 1 MG PO TABS
1.0000 mg | ORAL_TABLET | Freq: Every day | ORAL | Status: AC
  Administered 2018-07-01: 1 mg via ORAL
  Filled 2018-06-26 (×2): qty 1

## 2018-06-26 MED ORDER — PNEUMOCOCCAL VAC POLYVALENT 25 MCG/0.5ML IJ INJ: 0.5000 mL | INJECTION | INTRAMUSCULAR | Status: DC

## 2018-06-26 MED ORDER — LORAZEPAM 1 MG PO TABS
1.0000 mg | ORAL_TABLET | Freq: Four times a day (QID) | ORAL | Status: AC
  Administered 2018-06-26 – 2018-06-28 (×5): 1 mg via ORAL
  Filled 2018-06-26 (×5): qty 1

## 2018-06-26 MED ORDER — VITAMIN B-1 100 MG PO TABS
100.0000 mg | ORAL_TABLET | Freq: Every day | ORAL | Status: DC
  Administered 2018-06-27 – 2018-07-04 (×8): 100 mg via ORAL
  Filled 2018-06-26 (×13): qty 1

## 2018-06-26 MED ORDER — ONDANSETRON 4 MG PO TBDP
4.0000 mg | ORAL_TABLET | Freq: Four times a day (QID) | ORAL | Status: AC | PRN
  Administered 2018-06-26 – 2018-06-28 (×2): 4 mg via ORAL
  Filled 2018-06-26 (×2): qty 1

## 2018-06-26 MED ORDER — HYDROXYZINE HCL 25 MG PO TABS
25.0000 mg | ORAL_TABLET | Freq: Four times a day (QID) | ORAL | Status: AC | PRN
  Administered 2018-06-28: 25 mg via ORAL
  Filled 2018-06-26 (×3): qty 1

## 2018-06-26 MED ORDER — LORAZEPAM 1 MG PO TABS: 1.0000 mg | ORAL_TABLET | Freq: Four times a day (QID) | ORAL | Status: AC | PRN

## 2018-06-26 MED ORDER — ADULT MULTIVITAMIN W/MINERALS CH
1.0000 | ORAL_TABLET | Freq: Every day | ORAL | Status: DC
  Administered 2018-06-26 – 2018-07-04 (×9): 1 via ORAL
  Filled 2018-06-26 (×14): qty 1

## 2018-06-26 MED ORDER — THIAMINE HCL 100 MG/ML IJ SOLN: 100.0000 mg | Freq: Once | INTRAMUSCULAR | Status: DC

## 2018-06-26 MED ORDER — LORAZEPAM 1 MG PO TABS
1.0000 mg | ORAL_TABLET | Freq: Three times a day (TID) | ORAL | Status: AC
  Administered 2018-06-28 – 2018-06-29 (×3): 1 mg via ORAL
  Filled 2018-06-26 (×3): qty 1

## 2018-06-26 MED ORDER — ENSURE ENLIVE PO LIQD: 237.0000 mL | Freq: Two times a day (BID) | ORAL | Status: DC

## 2018-06-26 MED ORDER — LOSARTAN POTASSIUM 50 MG PO TABS
50.0000 mg | ORAL_TABLET | Freq: Every day | ORAL | Status: DC
  Administered 2018-06-27 – 2018-07-04 (×8): 50 mg via ORAL
  Filled 2018-06-26 (×10): qty 1

## 2018-06-26 NOTE — ED Notes (Signed)
Pt asked to give urine sample multiple times.  Patient states he already urinated and cannot go again.  [place] transportation here to take patient to [facility].  Patient is in no distress.  Urine sample was obtained.

## 2018-06-26 NOTE — Tx Team (Signed)
Initial Treatment Plan 06/26/2018 8:28 PM Todd [name] GLO:[id number]    PATIENT STRESSORS: Health problems Medication change or noncompliance Substance abuse   PATIENT STRENGTHS: Ability for insight Average or above average intelligence Capable of independent living General fund of knowledge Motivation for treatment/growth   PATIENT IDENTIFIED PROBLEMS: Depression Suicidal thoughts Substance Abuse "I need to get back on my medications"                     DISCHARGE CRITERIA:  Ability to meet basic life and health needs Improved stabilization in mood, thinking, and/or behavior Reduction of life-threatening or endangering symptoms to within safe limits Verbal commitment to aftercare and medication compliance Withdrawal symptoms are absent or subacute and managed without 24-hour nursing intervention  PRELIMINARY DISCHARGE PLAN: Attend aftercare/continuing care group Return to previous living arrangement  PATIENT/FAMILY INVOLVEMENT: This treatment plan has been presented to and reviewed with the patient, Todd [name], and/or family member, .  The patient and family have been given the opportunity to ask questions and make suggestions.  [place], [place], [place] 06/26/2018, 8:28 PM

## 2018-06-26 NOTE — H&P (Signed)
Behavioral Health Medical Screening Exam  Todd [name] is an 62 y.o. male who presents as a walk in due to severe depressive symptoms. Patient states "I'm not taking care of myself. I don't eat. I stopped all my medical medications. I just don't care. I have heard a voice talking to me as well. I just can't get myself together. I have been drinking every day too." Patient will require medical clearance at [facility] due to active medical problems and recent substance abuse. Patient meets criteria for inpatient psychiatric admission.   Total Time spent with patient: 20 minutes  Psychiatric Specialty Exam: Physical Exam  Constitutional: He is oriented to person, place, and time. He appears well-developed and well-nourished.  HENT:  Head: Normocephalic.  Cardiovascular: Normal rate, regular rhythm, normal heart sounds and intact distal pulses.  Respiratory: Effort normal and breath sounds normal.  GI: Soft. Bowel sounds are normal.  Musculoskeletal: Normal range of motion.  Neurological: He is alert and oriented to person, place, and time.  Skin: Skin is warm and dry.  Psychiatric: His speech is normal. He is withdrawn. Cognition and memory are normal. He expresses impulsivity. He exhibits a depressed mood. He expresses suicidal ideation.    ROS  Blood pressure (!) 177/106, pulse 85, temperature 98.9 F (37.2 C), resp. rate 18.There is no height or weight on file to calculate BMI.  General Appearance: Disheveled  Eye Contact:  Fair  Speech:  Clear and Coherent  Volume:  Decreased  Mood:  Dysphoric  Affect:  Constricted  Thought Process:  Coherent and Goal Directed  Orientation:  Full (Time, Place, and Person)  Thought Content:  Rumination  Suicidal Thoughts:  Yes.  without intent/plan  Homicidal Thoughts:  No  Memory:  Immediate;   Good Recent;   Good Remote;   Good  Judgement:  Fair  Insight:  Present  Psychomotor Activity:  Decreased  Concentration: Concentration: Fair and Attention  Span: Fair  Recall:  [employer] of Knowledge:Good  Language: Good  Akathisia:  No  Handed:  Right  AIMS (if indicated):     Assets:  Communication Skills Desire for Improvement Financial Resources/Insurance Housing Intimacy Leisure Time [place] Talents/Skills  Sleep:       Musculoskeletal: Strength & Muscle Tone: within normal limits Gait & Station: normal Patient leans: N/A  Blood pressure (!) 177/106, pulse 85, temperature 98.9 F (37.2 C), resp. rate 18.  Recommendations:  Based on my evaluation the patient does not appear to have an emergency medical condition. However, his blood pressure is noted to be elevated due to stopping his medications recently.   Elmarie Shiley, NP 06/26/2018, 12:49 PM
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
## 2018-06-26 NOTE — ED Triage Notes (Signed)
Pt states he is here to get medical clearance for behavior health because he is depressed.

## 2018-06-26 NOTE — BH Assessment (Signed)
[place] Assessment Progress Note    TTS contacted Charge RN, Erline Levine, to inform her that patient was a walk-in at [facility] and needs medical clearance prior to his admission to [facility].  After he is medically cleared, if appropriate for admission, patient will have a bed assignment to [facility]. Patient has been off his HTN meds and his blood pressure is elevated too much to be admitted to [facility].  He is also complaining of pain and some abdominal issues.

## 2018-06-26 NOTE — BH Assessment (Signed)
[place] Assessment Progress Note  Per Elmarie Shiley, NP, this pt requires psychiatric hospitalization at this time.  Drake Leach, RN, [facility] has pre-assigned pt to [facility] Rm 303[facility] pending medical clearance.  Pt has signed Voluntary Admission and Consent for Treatment, as well as Consent to Release Information to pt's PCP, [name], MD, and signed forms have been faxed to [facility].  Pt's nurse, Nena Jordan, has been notified, and agrees to send original paperwork along with pt via [facility]ries, and to call report to [phone] when the time comes.  Jalene Mullet, [place]illian Coordinator [phone]

## 2018-06-26 NOTE — Progress Notes (Signed)
Todd [name] is a 62 year old male pt admitted on voluntary basis after initially presenting as a walk-in. On admission, he reports that he has been feeling depressed and suicidal and does endorse passive SI on admission but is able to contract for safety while in the hospital. He reports that he has not been taking his medications as prescribed and reports that he has been drinking beer and wine on a daily basis and reports that his last consumption of alcohol was yesterday on 11/10. He does not display any overt signs or symptoms of withdrawal during admission. He complains of pain in left shoulder area and reports that he fell recently due to a fall because he was drunk. He denies any other substance abuse issues other than the alcohol. He reports that he lives at home with his wife and reports that he will return back there once he is discharged. He was escorted to the unit, oriented to the milieu and safety maintained.

## 2018-06-26 NOTE — ED Provider Notes (Signed)
[place] DEPT Provider Note   CSN: [id number] Arrival date & time: 06/26/18  1347     History   Chief Complaint Chief Complaint  Patient presents with  . Medical Clearance    HPI Todd [name] is a 62 y.o. male.  This is a 62 year old male with history of depression presents with worsening symptoms.  He denies any suicidal homicidal ideations.  States that he is not wanting to leave his room.  Denies any intentional ingestions at this time.  Does drink alcohol daily consisting of 2 bottles of water which rose.  His last drink was yesterday.  He does note some tremors.  Has a remote history of a fall over a week ago with injury to his left shoulder.  He is able to move his left arm but he does have discomfort at the left anterior shoulder characterizes sharp and worse with movement.  He has had some eye twitching which seems to be throughout the day but denies any vision loss or floaters in his visual field.  Patient also has been noncompliant with his antihypertensives.  Was seen to behavioral health [facility] and he meets inpatient criteria but has been seen for medical clearance.     Past Medical History:  Diagnosis Date  . Arthritis   . Cancer ([place]) 70yrs ago   PROSTATE  . Diabetes mellitus   . DVT (deep venous thrombosis) ([place])   . Hepatitis    hepatitis c, finished harvoni tx 3 months ago  . Hypercholesterolemia   . Hypertension   . Sleep apnea    not currently using cpap, mask causing vertigo    Patient Active Problem List   Diagnosis Date Noted  . Varicose veins of left lower extremity with complications 89/38/1017  . Benign paroxysmal positional vertigo 11/08/2013  . RBBB 06/02/2009  . 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 DM 04/11/2009  . OBESITY 04/11/2009  . DEPRESSION 04/11/2009  . HYPERTENSION 04/11/2009  . DIVERTICULAR DISEASE 04/11/2009  . CHOLECYSTITIS 04/11/2009  . CHEST PAIN 04/11/2009  . ABDOMINAL PAIN 04/11/2009     Past Surgical History:  Procedure Laterality Date  . COLONOSCOPY WITH PROPOFOL N/A 02/19/2014   Procedure: COLONOSCOPY WITH PROPOFOL;  Surgeon: Garlan Fair, MD;  [facility];  Service: Endoscopy;  Laterality: N/A;  . ENDOVENOUS ABLATION SAPHENOUS VEIN W/ LASER Left 11/22/2017   endovenous laser ablation L SSV by Tinnie Gens MD   . [place] Right    QUADS  . PROSTATE SURGERY          Home Medications    Prior to Admission medications   Medication Sig Start Date End Date Taking? Authorizing Provider  aspirin EC 81 MG tablet Take 81 mg by mouth daily.   Yes [provider]  atorvastatin (LIPITOR) 10 MG tablet  11/19/17  Yes [provider]  losartan (COZAAR) 50 MG tablet Take 50 mg by mouth daily. 06/15/18  Yes [provider]  metFORMIN (GLUCOPHAGE) 1000 MG tablet Take 1,000 mg by mouth 2 (two) times daily with a meal.   Yes [provider]  Multiple Vitamin (MULTIVITAMIN WITH MINERALS) TABS tablet Take 1 tablet by mouth daily.   Yes [provider]  venlafaxine (EFFEXOR) 100 MG tablet Take 100 mg by mouth 2 (two) times daily.   Yes [provider]  [facility] VERIO test strip  11/18/17   [provider]    Family History Family History  Problem Relation Age of Onset  .  Cancer Father        PROSTATE    Social History Social History   Tobacco Use  . Smoking status: Current Some Day Smoker    Packs/day: 0.50  . Smokeless tobacco: Never Used  . Tobacco comment: social smoking only  Substance Use Topics  . Alcohol use: Yes    Comment: every few days  . 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   Review of Systems Review of Systems  All other systems reviewed and are negative.    Physical Exam Updated Vital Signs BP (!) 164/98 (BP Location: Right Arm)   Pulse 74   Temp (!) 97.5 F (36.4 C) (Oral)   Resp 17   SpO2 96%    Physical Exam  Constitutional: He is oriented to person, place, and time. He appears well-developed and well-nourished.  Non-toxic appearance. No distress.  HENT:  Head: Normocephalic and atraumatic.  Eyes: Pupils are equal, round, and reactive to light. Conjunctivae, EOM and lids are normal.  Neck: Normal range of motion. Neck supple. No tracheal deviation present. No thyroid mass present.  Cardiovascular: Normal rate, regular rhythm and normal heart sounds. Exam reveals no gallop.  No murmur heard. Pulmonary/Chest: Effort normal and breath sounds normal. No stridor. No respiratory distress. He has no decreased breath sounds. He has no wheezes. He has no rhonchi. He has no rales.  Abdominal: Soft. Normal appearance and bowel sounds are normal. He exhibits no distension. There is no tenderness. There is no rebound and no CVA tenderness.  Musculoskeletal: He exhibits no edema or tenderness.       Left shoulder: He exhibits decreased range of motion and bony tenderness. He exhibits no swelling and no deformity.       Arms: Neurological: He is alert and oriented to person, place, and time. He has normal strength. No cranial nerve deficit or sensory deficit. GCS eye subscore is 4. GCS verbal subscore is 5. GCS motor subscore is 6.  Skin: Skin is warm and dry. No abrasion and no rash noted.  Psychiatric: His speech is normal. His affect is blunt. He is withdrawn.  Nursing note and vitals reviewed.    ED Treatments / Results  Labs (all labs ordered are listed, but only abnormal results are displayed) Labs Reviewed  CBC - Abnormal; Notable for the following components:      Result Value   RBC 4.01 (*)    MCV 102.0 (*)    All other components within normal limits  COMPREHENSIVE METABOLIC PANEL  ETHANOL  SALICYLATE LEVEL  ACETAMINOPHEN LEVEL  RAPID URINE DRUG SCREEN, HOSP PERFORMED    EKG None  Radiology No results found.  Procedures Procedures (including critical care  time)  Medications Ordered in ED Medications  LORazepam (ATIVAN) tablet 2 mg (has no administration in time range)  losartan (COZAAR) tablet 50 mg (has no administration in time range)     Initial Impression / Assessment and Plan / ED Course  I have reviewed the triage vital signs and the nursing notes.  Pertinent labs & imaging results that were available during my care of the patient were reviewed by me and considered in my medical decision making (see chart for details).     Patient shoulder x-ray is negative.  His hemoglobin and glucose are stable.  Given his regular dose of losartan.  Medicated with Ativan and feels better and he is medically  clear for psychiatric consultation.  Final Clinical Impressions(s) / ED Diagnoses   Final diagnoses:  None    ED Discharge Orders    None       Lacretia Leigh, MD 06/26/18 1712

## 2018-06-26 NOTE — ED Notes (Signed)
Failed straight stick to right forearm for lab draw.

## 2018-06-26 NOTE — ED Notes (Signed)
Bed: BB40 Expected date:  Expected time:  Means of arrival:  Comments: Walk in [facility]-medical clearance

## 2018-06-27 DIAGNOSIS — F419 Anxiety disorder, unspecified: Secondary | ICD-10-CM

## 2018-06-27 DIAGNOSIS — F1020 Alcohol dependence, uncomplicated: Secondary | ICD-10-CM

## 2018-06-27 DIAGNOSIS — F1120 Opioid dependence, uncomplicated: Secondary | ICD-10-CM

## 2018-06-27 DIAGNOSIS — F1010 Alcohol abuse, uncomplicated: Secondary | ICD-10-CM

## 2018-06-27 DIAGNOSIS — F332 Major depressive disorder, recurrent severe without psychotic features: Principal | ICD-10-CM

## 2018-06-27 DIAGNOSIS — I10 Essential (primary) hypertension: Secondary | ICD-10-CM

## 2018-06-27 DIAGNOSIS — E119 Type 2 diabetes mellitus without complications: Secondary | ICD-10-CM

## 2018-06-27 MED ORDER — METFORMIN HCL 500 MG PO TABS
1000.0000 mg | ORAL_TABLET | Freq: Two times a day (BID) | ORAL | Status: DC
  Administered 2018-06-27 – 2018-07-04 (×15): 1000 mg via ORAL
  Filled 2018-06-27 (×20): qty 2

## 2018-06-27 MED ORDER — ENSURE ENLIVE PO LIQD
237.0000 mL | Freq: Every day | ORAL | Status: DC | PRN
  Administered 2018-06-28 – 2018-07-04 (×4): 237 mL via ORAL
  Filled 2018-06-27 (×4): qty 237

## 2018-06-27 MED ORDER — VENLAFAXINE HCL ER 75 MG PO CP24
75.0000 mg | ORAL_CAPSULE | Freq: Every day | ORAL | Status: DC
  Filled 2018-06-27: qty 1

## 2018-06-27 MED ORDER — ATORVASTATIN CALCIUM 10 MG PO TABS
10.0000 mg | ORAL_TABLET | Freq: Every day | ORAL | Status: DC
  Administered 2018-06-27 – 2018-07-03 (×7): 10 mg via ORAL
  Filled 2018-06-27 (×11): qty 1

## 2018-06-27 MED ORDER — ASPIRIN EC 81 MG PO TBEC
81.0000 mg | DELAYED_RELEASE_TABLET | Freq: Every day | ORAL | Status: DC
  Administered 2018-06-27 – 2018-07-04 (×8): 81 mg via ORAL
  Filled 2018-06-27 (×11): qty 1

## 2018-06-27 MED ORDER — VENLAFAXINE HCL ER 75 MG PO CP24
75.0000 mg | ORAL_CAPSULE | Freq: Every day | ORAL | Status: DC
  Administered 2018-06-27 – 2018-07-02 (×6): 75 mg via ORAL
  Filled 2018-06-27 (×8): qty 1

## 2018-06-27 MED ORDER — ACETAMINOPHEN 325 MG PO TABS
650.0000 mg | ORAL_TABLET | Freq: Four times a day (QID) | ORAL | Status: DC | PRN
  Administered 2018-06-30 – 2018-07-03 (×2): 650 mg via ORAL
  Filled 2018-06-27 (×2): qty 2

## 2018-06-27 MED ORDER — IBUPROFEN 800 MG PO TABS
800.0000 mg | ORAL_TABLET | Freq: Four times a day (QID) | ORAL | Status: DC | PRN
  Administered 2018-06-27 – 2018-07-04 (×8): 800 mg via ORAL
  Filled 2018-06-27 (×8): qty 1

## 2018-06-27 NOTE — [facility]HH Suicide Risk Assessment (Signed)
[facility] Admission Suicide Risk Assessment   Nursing information obtained from:  Patient Demographic factors:  Male, Unemployed Current Mental Status:  Self-harm thoughts Loss Factors:  Decline in physical health Historical Factors:  Prior suicide attempts, Family history of mental illness or substance abuse Risk Reduction Factors:  Living with another person, especially a relative, Positive therapeutic relationship, Positive coping skills or problem solving skills  Total Time spent with patient: 1 hour Principal Problem: MDD (major depressive disorder), recurrent severe, without psychosis ([place]) Diagnosis:   Patient Active Problem List   Diagnosis Date Noted  . Alcohol use disorder, severe, dependence ([place]) [F10.20] 06/27/2018  . MDD (major depressive disorder), recurrent severe, without psychosis ([place]) [F33.2] 06/26/2018  . Varicose veins of left lower extremity with complications [F00.712] 19/75/8832  . Benign paroxysmal positional vertigo [H81.10] 11/08/2013  . RBBB [I45.10] 06/02/2009  . 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 DM [E11.9] 04/11/2009  . OBESITY [E66.9] 04/11/2009  . DEPRESSION [F32.9] 04/11/2009  . HYPERTENSION [I10] 04/11/2009  . DIVERTICULAR DISEASE [K57.30] 04/11/2009  . CHOLECYSTITIS [K81.9] 04/11/2009  . CHEST PAIN [R07.9] 04/11/2009  . ABDOMINAL PAIN [R10.9] 04/11/2009   Subjective Data: see H&P  Continued Clinical Symptoms:  Alcohol Use Disorder Identification Test Final Score (AUDIT): 37 The "Alcohol Use Disorders Identification Test", Guidelines for Use in Primary Care, Second Edition.  World Pharmacologist [facility]). Score between 0-7:  no or low risk or alcohol related problems. Score between 8-15:  moderate risk of alcohol related problems. Score between 16-19:  high risk of alcohol related problems. Score 20 or above:  warrants further diagnostic evaluation for alcohol dependence and treatment.   CLINICAL FACTORS:   Depression:   Comorbid  alcohol abuse/dependence Alcohol/Substance Abuse/Dependencies    Psychiatric Specialty Exam: Physical Exam  Nursing note and vitals reviewed.     Blood pressure (!) 186/95, pulse 65, temperature 98.5 F (36.9 C), temperature source Oral, resp. rate 16, height 5\' 8"  (1.727 m), weight 105.2 kg.Body mass index is 35.28 kg/m.     COGNITIVE FEATURES THAT CONTRIBUTE TO RISK:  None    SUICIDE RISK:   Mild:  Suicidal ideation of limited frequency, intensity, duration, and specificity.  There are no identifiable plans, no associated intent, mild dysphoria and related symptoms, good self-control (both objective and subjective assessment), few other risk factors, and identifiable protective factors, including available and accessible social support.  PLAN OF CARE: see H&P  I certify that inpatient services furnished can reasonably be expected to improve the patient's condition.   Pennelope Bracken, MD 06/27/2018, 11:47 AM

## 2018-06-27 NOTE — Progress Notes (Signed)
Psychoeducational Group Note  Date:  06/27/2018 Time:  2207  Group Topic/Focus:  Wrap-Up Group:   The focus of this group is to help patients review their daily goal of treatment and discuss progress on daily workbooks.  Participation Level: Did Not Attend  Participation Quality:  Not Applicable  Affect:  Not Applicable  Cognitive:  Not Applicable  Insight:  Not Applicable  Engagement in Group: Not Applicable  Additional Comments:  The patient did not attend group since he remained in his bed.   Archie Balboa S 06/27/2018, 10:07 PM
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
## 2018-06-27 NOTE — Plan of Care (Signed)
Patient appeared depressed and anxious this morning. Patient said he did not sleep too well, but he did take medication to help him sleep. Denies SI HI AVH. Endorses physical pain in his shoulder. Medication administered. Patient is compliant with medications prescribed per provider. No side effects noted. Safety is maintained with 15 minute checks as well as environmental checks. Will continue to monitor.  Problem: Safety: Goal: Periods of time without injury will increase Outcome: Progressing   Problem: Education: Goal: Emotional status will improve Outcome: Not Progressing Goal: Mental status will improve Outcome: Not Progressing   Problem: Activity: Goal: Sleeping patterns will improve Outcome: Not Progressing

## 2018-06-27 NOTE — [facility] Group Notes (Signed)
[facility] Mental Health Association Group Therapy 06/27/2018 1:15pm  Type of Therapy: Mental Health Association Presentation  Participation Level: Invited. DID NOT ATTEND. Pt chose to remain in bed.   Avelina Laine, LCSW 06/27/2018 2:12 PM

## 2018-06-27 NOTE — [facility] Suicide Risk Assessment (Signed)
[place] INPATIENT:  Family/Significant Other Suicide Prevention Education  Suicide Prevention Education:  Education Completed; Todd [name] (pt's wife) [phone] has been identified by the patient as the family member/significant other with whom the patient will be residing, and identified as the person(s) who will aid the patient in the event of a mental health crisis (suicidal ideations/suicide attempt).  With written consent from the patient, the family member/significant other has been provided the following suicide prevention education, prior to the and/or following the discharge of the patient.  The suicide prevention education provided includes the following:  Suicide risk factors  Suicide prevention and interventions  National Suicide Hotline telephone number  [facility] assessment telephone number  [facility] Emergency Assistance [place] and/or Residential Mobile Crisis Unit telephone number  Request made of family/significant other to:  Remove weapons (e.g., guns, rifles, knives), all items previously/currently identified as safety concern.    Remove drugs/medications (over-the-counter, prescriptions, illicit drugs), all items previously/currently identified as a safety concern.  The family member/significant other verbalizes understanding of the suicide prevention education information provided.  The family member/significant other agrees to remove the items of safety concern listed above.  Pt's wife reports that she will be out of town until Monday 11/18 night and is worried about pt discharging before she gets home. Pt's wife shares that "this is the worst I've ever seen him. I really don't want him discharge until I come home." SPE and aftercare reviewed with pt's wife.   Avelina Laine LCSW 06/27/2018, 2:22 PM

## 2018-06-27 NOTE — Progress Notes (Signed)
NUTRITION ASSESSMENT  Pt identified as at risk on the Malnutrition Screen Tool  INTERVENTION: Supplements: will change Ensure Enlive to once/day PRN, this supplement provides 350 kcal and 20 grams protein.   NUTRITION DIAGNOSIS: Unintentional weight loss related to sub-optimal intake as evidenced by pt report.   Goal: Pt to meet >/= 90% of their estimated nutrition needs.  Monitor:  PO intake  Assessment:  Patient admitted for depression and SI. Patient reported that he has not been taking his medications and that he has been drinking wine and beer daily with last intake on 11/10 (the day PTA).   Per chart review, current weight is 231 lb and weight on 10/17 was 239 lb. This indicates 8 lb weight loss (3% body weight) in 1 month; not significant for time frame.    62 y.o. male  Height: Ht Readings from Last 1 Encounters:  06/26/18 5\' 8"  (1.727 m)    Weight: Wt Readings from Last 1 Encounters:  06/26/18 105.2 kg    Weight Hx: Wt Readings from Last 10 Encounters:  06/26/18 105.2 kg  06/07/18 107 kg  06/01/18 108.8 kg  04/24/18 105.2 kg  04/13/18 107 kg  02/23/18 107.2 kg  11/28/17 102.1 kg  11/22/17 103.4 kg  11/15/17 103.3 kg  08/15/17 98.7 kg    BMI:  Body mass index is 35.28 kg/m. Pt meets criteria for obesity based on current BMI.  Estimated Nutritional Needs: Kcal: 25-30 kcal/kg Protein: > 1 gram protein/kg Fluid: 1 ml/kcal  Diet Order:  Diet Order            Diet Heart Room service appropriate? Yes; Fluid consistency: Thin  Diet effective now             Pt is also offered choice of unit snacks mid-morning and mid-afternoon.  Pt is eating as desired.   Lab results and medications reviewed.     Jarome Matin, MS, RD, LDN, [facility] Inpatient Clinical Dietitian Pager # [phone] After hours/weekend pager # [phone]

## 2018-06-27 NOTE — Progress Notes (Signed)
Nursing note 7p-7a  Pt observed isolating in room this shift, not wanting to get out of bed. "I just want to sleep". Displayed a irritable affect and mood upon interaction with this Probation officer. Pt c/o anxiety, nausea, mil headache pain ,denies SI/HI, and also denies any audio or visual hallucinations at this time. CIWA 6. See Mar for prn medication administration.  Pt is able to verbally contract for safety with this RN. Pt educated on falls and encouraged to wear nonskid socks when ambulating in the halls. Pt also encouraged to call for help if feeling weak or dizzy. Pt verbalized understanding of all education provided. Pt is now resting in bed with eyes closed, with no signs or symptoms of pain or distress noted. Pt continues to remain safe on the unit and is observed by rounding every 15 min. RN will continue to monitor.

## 2018-06-27 NOTE — Progress Notes (Signed)
Recreation Therapy Notes  Animal-Assisted Activity (AAA) Program Checklist/Progress Notes Patient Eligibility Criteria Checklist & Daily Group note for Rec Tx Intervention  Date: 11.12.19 Time: 26 Location: [address]   AAA/T Program Assumption of Risk Form signed by Teacher, music or Parent Legal Guardian YES   Patient is free of allergies or sever asthma  YES   Patient reports no fear of animals  YES  Patient reports no history of cruelty to animals YES   Patient understands his/her participation is voluntary YES      Patient washes hands before animal contact  YES   Patient washes hands after animal contact YES   Education: Contractor, Appropriate Animal Interaction   Education Outcome: Acknowledges understanding/In group clarification offered/Needs additional education.   Clinical Observations/Feedback: Pt did not attend activity.     Victorino Sparrow, LRT/CTRS         Ria Comment, [place]an Sangster A 06/27/2018 3:24 PM

## 2018-06-27 NOTE — Progress Notes (Signed)
Patient ID: Todd [name], male   DOB: 1955/12/28, 62 y.o.   MRN: [id number] PER STATE REGULATIONS 482.30  THIS CHART WAS REVIEWED FOR MEDICAL NECESSITY WITH RESPECT TO THE PATIENT'S ADMISSION/DURATION OF STAY.  NEXT REVIEW DATE:06/30/18  Roma Schanz, RN, BSN CASE MANAGER

## 2018-06-28 MED ORDER — CLONIDINE HCL 0.1 MG PO TABS
0.1000 mg | ORAL_TABLET | ORAL | Status: AC
  Administered 2018-06-30 – 2018-07-01 (×4): 0.1 mg via ORAL
  Filled 2018-06-28 (×5): qty 1

## 2018-06-28 MED ORDER — TRAZODONE HCL 50 MG PO TABS
50.0000 mg | ORAL_TABLET | Freq: Every day | ORAL | Status: DC
  Administered 2018-06-28 – 2018-07-01 (×4): 50 mg via ORAL
  Filled 2018-06-28 (×6): qty 1

## 2018-06-28 MED ORDER — DICYCLOMINE HCL 20 MG PO TABS
20.0000 mg | ORAL_TABLET | Freq: Four times a day (QID) | ORAL | Status: AC | PRN
  Administered 2018-06-28 – 2018-07-02 (×3): 20 mg via ORAL
  Filled 2018-06-28 (×3): qty 1

## 2018-06-28 MED ORDER — CLONIDINE HCL 0.1 MG PO TABS
0.1000 mg | ORAL_TABLET | Freq: Every day | ORAL | Status: AC
  Administered 2018-07-02 – 2018-07-03 (×2): 0.1 mg via ORAL
  Filled 2018-06-28 (×2): qty 1

## 2018-06-28 MED ORDER — CLONIDINE HCL 0.1 MG PO TABS
0.1000 mg | ORAL_TABLET | Freq: Four times a day (QID) | ORAL | Status: AC
  Administered 2018-06-28 – 2018-06-29 (×7): 0.1 mg via ORAL
  Filled 2018-06-28 (×9): qty 1

## 2018-06-28 MED ORDER — METHOCARBAMOL 500 MG PO TABS
500.0000 mg | ORAL_TABLET | Freq: Three times a day (TID) | ORAL | Status: AC | PRN
  Administered 2018-06-28 – 2018-06-29 (×2): 500 mg via ORAL
  Filled 2018-06-28 (×3): qty 1

## 2018-06-28 NOTE — Progress Notes (Signed)
Pt was observed in room, seen resting in bed with eyes open. Pt appears flat /irritable in affect and mood. Pt denies SI/HI/AVH; Rates pain 8/10/Generalized. No new c/o's. Pt was minimal on approach. High falls risk protocol maintained. Falls risk reduction methods discussed with Pt. Pt verbalized understanding. Walker and wheelchair by bedside. Pt presents with poor hygiene; foul urine odor notice. Hygienic products given to Pt and encouragement provided. PRN ibuprofen, vistaril, and ensure requested and given. Will continue with POC.

## 2018-06-28 NOTE — Tx Team (Signed)
Interdisciplinary Treatment and Diagnostic Plan Update  06/28/2018 Time of Session: 0830AM Todd [name] MRN: [id number]  Principal Diagnosis: MDD (major depressive disorder), recurrent severe, without psychosis ([place])  Secondary Diagnoses: Principal Problem:   MDD (major depressive disorder), recurrent severe, without psychosis ([place]) Active Problems:   Alcohol use disorder, severe, dependence ([place])   Current Medications:  Current Facility-Administered Medications  Medication Dose Route Frequency Provider Last Rate Last Dose  . acetaminophen (TYLENOL) tablet 650 mg  650 mg Oral Q6H PRN Pennelope Bracken, MD      . aspirin EC tablet 81 mg  81 mg Oral Daily Pennelope Bracken, MD   81 mg at 06/28/18 0827  . atorvastatin (LIPITOR) tablet 10 mg  10 mg Oral q1800 Pennelope Bracken, MD   10 mg at 06/27/18 1740  . feeding supplement (ENSURE ENLIVE) (ENSURE ENLIVE) liquid 237 mL  237 mL Oral Daily PRN Sharma Covert, MD      . hydrOXYzine (ATARAX/VISTARIL) tablet 25 mg  25 mg Oral Q6H PRN Elmarie Shiley A, NP      . ibuprofen (ADVIL,MOTRIN) tablet 800 mg  800 mg Oral Q6H PRN Pennelope Bracken, MD   800 mg at 06/27/18 1217  . loperamide (IMODIUM) capsule 2-4 mg  2-4 mg Oral PRN Elmarie Shiley A, NP      . LORazepam (ATIVAN) tablet 1 mg  1 mg Oral Q6H PRN Elmarie Shiley A, NP      . LORazepam (ATIVAN) tablet 1 mg  1 mg Oral QID Elmarie Shiley A, NP   1 mg at 06/28/18 0827   Followed by  . LORazepam (ATIVAN) tablet 1 mg  1 mg Oral TID Niel Hummer, NP       Followed by  . [START ON 06/29/2018] LORazepam (ATIVAN) tablet 1 mg  1 mg Oral BID Niel Hummer, NP       Followed by  . [START ON 07/01/2018] LORazepam (ATIVAN) tablet 1 mg  1 mg Oral Daily Elmarie Shiley A, NP      . losartan (COZAAR) tablet 50 mg  50 mg Oral Daily Elmarie Shiley A, NP   50 mg at 06/28/18 0827  . metFORMIN (GLUCOPHAGE) tablet 1,000 mg  1,000 mg Oral BID WC Pennelope Bracken, MD   1,000 mg at  06/28/18 0827  . multivitamin with minerals tablet 1 tablet  1 tablet Oral Daily Elmarie Shiley A, NP   1 tablet at 06/28/18 0827  . nicotine (NICODERM CQ - dosed in mg/24 hours) patch 21 mg  21 mg Transdermal Daily Pennelope Bracken, MD   21 mg at 06/28/18 0828  . ondansetron (ZOFRAN-ODT) disintegrating tablet 4 mg  4 mg Oral Q6H PRN Niel Hummer, NP   4 mg at 06/26/18 2127  . pneumococcal 23 valent vaccine (PNU-IMMUNE) injection 0.5 mL  0.5 mL Intramuscular Tomorrow-1000 Maris Berger T, MD      . thiamine (B-1) injection 100 mg  100 mg Intramuscular Once Elmarie Shiley A, NP      . thiamine (VITAMIN B-1) tablet 100 mg  100 mg Oral Daily Elmarie Shiley A, NP   100 mg at 06/28/18 4270  . traZODone (DESYREL) tablet 50 mg  50 mg Oral QHS Maris Berger T, MD      . venlafaxine XR (EFFEXOR-XR) 24 hr capsule 75 mg  75 mg Oral Q breakfast Pennelope Bracken, MD   75 mg at 06/28/18 0827   PTA Medications: Facility-Administered Medications Prior to Admission  Medication Dose Route Frequency Provider Last Rate Last Dose  . triamcinolone acetonide (KENALOG) 10 MG/ML injection 10 mg  10 mg Other Once Landis Martins, DPM      . triamcinolone acetonide (KENALOG) 10 MG/ML injection 10 mg  10 mg Other Once Landis Martins, DPM       Medications Prior to Admission  Medication Sig Dispense Refill Last Dose  . aspirin EC 81 MG tablet Take 81 mg by mouth daily.   06/26/2018 at Unknown time  . atorvastatin (LIPITOR) 10 MG tablet   3 06/26/2018 at Unknown time  . losartan (COZAAR) 50 MG tablet Take 50 mg by mouth daily.  11 06/26/2018 at Unknown time  . metFORMIN (GLUCOPHAGE) 1000 MG tablet Take 1,000 mg by mouth 2 (two) times daily with a meal.   06/26/2018 at Unknown time  . Multiple Vitamin (MULTIVITAMIN WITH MINERALS) TABS tablet Take 1 tablet by mouth daily.   06/26/2018 at Unknown time  . ONETOUCH VERIO test strip   11 Taking  . venlafaxine (EFFEXOR) 100 MG tablet Take 100 mg by  mouth 2 (two) times daily.   06/26/2018 at Unknown time    Patient Stressors: Health problems Medication change or noncompliance Substance abuse  Patient Strengths: Ability for insight Average or above average intelligence Capable of independent living [employer] of knowledge Motivation for treatment/growth  Treatment Modalities: Medication Management, Group therapy, Case management,  1 to 1 session with clinician, Psychoeducation, Recreational therapy.   Physician Treatment Plan for Primary Diagnosis: MDD (major depressive disorder), recurrent severe, without psychosis ([place]) Long Term Goal(s): Improvement in symptoms so as ready for discharge Improvement in symptoms so as ready for discharge   Short Term Goals: Ability to identify and develop effective coping behaviors will improve Ability to identify triggers associated with substance abuse/mental health issues will improve  Medication Management: Evaluate patient's response, side effects, and tolerance of medication regimen.  Therapeutic Interventions: 1 to 1 sessions, Unit Group sessions and Medication administration.  Evaluation of Outcomes: Progressing  Physician Treatment Plan for Secondary Diagnosis: Principal Problem:   MDD (major depressive disorder), recurrent severe, without psychosis ([place]) Active Problems:   Alcohol use disorder, severe, dependence ([place])  Long Term Goal(s): Improvement in symptoms so as ready for discharge Improvement in symptoms so as ready for discharge   Short Term Goals: Ability to identify and develop effective coping behaviors will improve Ability to identify triggers associated with substance abuse/mental health issues will improve     Medication Management: Evaluate patient's response, side effects, and tolerance of medication regimen.  Therapeutic Interventions: 1 to 1 sessions, Unit Group sessions and Medication administration.  Evaluation of Outcomes: Progressing   RN  Treatment Plan for Primary Diagnosis: MDD (major depressive disorder), recurrent severe, without psychosis ([place]) Long Term Goal(s): Knowledge of disease and therapeutic regimen to maintain health will improve  Short Term Goals: Ability to remain free from injury will improve, Ability to verbalize frustration and anger appropriately will improve, Ability to verbalize feelings will improve and Ability to identify and develop effective coping behaviors will improve  Medication Management: RN will administer medications as ordered by provider, will assess and evaluate patient's response and provide education to patient for prescribed medication. RN will report any adverse and/or side effects to prescribing provider.  Therapeutic Interventions: 1 on 1 counseling sessions, Psychoeducation, Medication administration, Evaluate responses to treatment, Monitor vital signs and CBGs as ordered, Perform/monitor CIWA, COWS, AIMS and Fall Risk screenings as ordered, Perform wound care treatments as ordered.  Evaluation of Outcomes: Progressing   LCSW Treatment Plan for Primary Diagnosis: MDD (major depressive disorder), recurrent severe, without psychosis ([place]) Long Term Goal(s): Safe transition to appropriate next level of care at discharge, Engage patient in therapeutic group addressing interpersonal concerns.  Short Term Goals: Engage patient in aftercare planning with referrals and resources, Facilitate patient progression through stages of change regarding substance use diagnoses and concerns and Identify triggers associated with mental health/substance abuse issues  Therapeutic Interventions: Assess for all discharge needs, 1 to 1 time with Social worker, Explore available resources and support systems, Assess for adequacy in community support network, Educate family and significant other(s) on suicide prevention, Complete Psychosocial Assessment, Interpersonal group therapy.  Evaluation of Outcomes:  Progressing   Progress in Treatment: Attending groups: Yes. Participating in groups: Yes. Taking medication as prescribed: Yes. Toleration medication: Yes. Family/Significant other contact made: Yes, individual(s) contacted:  pt's wife for collateral information and completed SPE. Patient understands diagnosis: Yes. Discussing patient identified problems/goals with staff: Yes. Medical problems stabilized or resolved: Yes. Denies suicidal/homicidal ideation: Yes. Issues/concerns per patient self-inventory: No. Other: n/a   New problem(s) identified: No, Describe:  n/a  New Short Term/Long Term Goal(s): detox, medication management for mood stabilization; elimination of SI thoughts; development of comprehensive mental wellness/sobriety plan.   Patient Goals:  "I need to get back on my medications and stay away from alcohol."   Discharge Plan or Barriers: Pt plans to return home and has follow-up in place at the [facility] for medication management and SAIOP. [place] pamphlet, Mobile Crisis information, and AA/NA information provided to patient for additional community support and resources.   Reason for Continuation of Hospitalization: Anxiety Depression Medication stabilization Withdrawal symptoms  Estimated Length of Stay: Tuesday, 07/04/18  Attendees: Patient: 06/28/2018 10:48 AM  Physician: Dr. Nancy Fetter MD 06/28/2018 10:48 AM  Nursing: Estill Bamberg RN; [facility] RN 06/28/2018 10:48 AM  RN Care Manager:x 06/28/2018 10:48 AM  Social Worker: Janice Norrie LCSW 06/28/2018 10:48 AM  Recreational Therapist: x 06/28/2018 10:48 AM  Other: Lindell Spar NP 06/28/2018 10:48 AM  Other:  06/28/2018 10:48 AM  Other: 06/28/2018 10:48 AM    Scribe for Treatment Team: Avelina Laine, LCSW 06/28/2018 10:48 AM

## 2018-06-28 NOTE — Plan of Care (Signed)
  Problem: Health Behavior/Discharge Planning: Goal: Compliance with treatment plan for underlying cause of condition will improve Outcome: Progressing   Problem: Safety: Goal: Periods of time without injury will increase Outcome: Progressing   Baldo Daub, RN, BSN 06/28/2018 11:05 AM

## 2018-06-28 NOTE — Progress Notes (Addendum)
Patient ID: Todd [name], male   DOB: [birth year], 62 y.o.   MRN: [id number]  Nursing Progress Note [id number]  Data: Patient presents with anxious mood but is cooperative with writer this morning. On initial approach, patient was still in bed but did get up for scheduled medications. Patient denies pain/physical complaints for writer.  Patient is observed resting in his room and is not attending groups. Patient currently denies SI/HI/AVH or withdrawal symptoms. BP is elevated this morning, scheduled ativan provided. Patient is provided and encouraged to ambulate with a walker per MD verbal order. Patient refused flu shot for second time and was encouraged to let staff know if he changes his mind.  Action: Patient is educated about and provided medication per provider's orders. Patient safety maintained with q15 min safety checks and frequent rounding. High fall risk precautions in place, educated reviewed with patient. Emotional support given. 1:1 interaction and active listening provided. Patient encouraged to attend meals, groups, and work on treatment plan and goals. Labs, vital signs and patient behavior monitored throughout shift.   Response: Patient remains safe on the unit at this time and agrees to come to staff with any issues/concerns. Will continue to support and monitor.

## 2018-06-28 NOTE — [facility] Group Notes (Signed)
[facility] Group Therapy 06/28/2018 1:15pm  Type of Therapy: Mental [facility] Presentation  Participation Level: Pt invited. DID NOT ATTEND. Pt chose to remain in bed.   Avelina Laine, LCSW 06/28/2018 9:45 AM
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
## 2018-06-28 NOTE — [facility] Group Notes (Signed)
Adult Nursing Psychoeducational Group Note  Date: 06/28/2018  Time: 3:00 PM  Group Topic/Focus: Relaxation Techniques Facilitator: Marzetta Merino RN  Participation Level:  Did Not Attend  Additional Comments:  Patient invited but declined to attend group.  Todd [name] 06/28/2018 3:30 PM

## 2018-06-29 DIAGNOSIS — F1120 Opioid dependence, uncomplicated: Secondary | ICD-10-CM

## 2018-06-29 NOTE — Progress Notes (Signed)
Pt did not attend wrap-up group   

## 2018-06-29 NOTE — [facility] Group Notes (Signed)
LCSW Group Therapy Note   06/29/2018 1:15pm   Type of Therapy and Topic:  Group Therapy:  Overcoming Obstacles   Participation Level:  Did Not Attend--pt invited. Chose to remain in bed.    Description of Group:    In this group patients will be encouraged to explore what they see as obstacles to their own wellness and recovery. They will be guided to discuss their thoughts, feelings, and behaviors related to these obstacles. The group will process together ways to cope with barriers, with attention given to specific choices patients can make. Each patient will be challenged to identify changes they are motivated to make in order to overcome their obstacles. This group will be process-oriented, with patients participating in exploration of their own experiences as well as giving and receiving support and challenge from other group members.   Therapeutic Goals: 1. Patient will identify personal and current obstacles as they relate to admission. 2. Patient will identify barriers that currently interfere with their wellness or overcoming obstacles.  3. Patient will identify feelings, thought process and behaviors related to these barriers. 4. Patient will identify two changes they are willing to make to overcome these obstacles:      Summary of Patient Progress      Therapeutic Modalities:   Cognitive Behavioral Therapy Solution Focused Therapy Motivational Interviewing Relapse Prevention Therapy  Todd [name], [place] 06/29/2018 11:04 AM

## 2018-06-29 NOTE — [facility] Group Notes (Signed)
Pt was invited, but did not attend orientation / goals group.  

## 2018-06-29 NOTE — Plan of Care (Signed)
  Problem: Activity: Goal: Interest or engagement in activities will improve Outcome: Not Progressing   Problem: Safety: Goal: Periods of time without injury will increase Outcome: Progressing   D: Pt alert and oriented on the unit. Pt engaging with RN staff and other pts. Pt denies SI/HI, A/VH. Pt's affect was flat and mood sad. Pt was isolative in his room and slept most of the day. Pt also did not leave the unit for meals, but ate in his room. A: Education, support and encouragement provided, q15 minute safety checks remain in effect. Medications administered per MD orders. R: No reactions/side effects to medicine noted. Pt denies any concerns at this time and verbally contracts for safety. Pt remains safe on the unit.

## 2018-06-29 NOTE — Progress Notes (Addendum)
Pt was observed in room, seen resting in bed with eyes closed. Pt was awaken for assessment. Pt did not attend wrap-up group this evening.Pt appears flat/anxious/isolative in affect and mood. Pt denies SI/HI/AVH; Rates pain 8/10/Generalized. Pt c/o of abdominal discomfort. Pt was minimal on approach. High falls risk protocol maintained. Falls risk reduction methods discussed with Pt. Pt verbalized understanding. Walker and wheelchair by bedside. Pt presents with poor hygiene; foul urine odor notice. Writer assisted Pt to clean room. New sheets and scrubs given. PRN ibuprofen, robaxin, and bentyl requested and given. Will continue with POC
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
## 2018-06-30 LAB — GLUCOSE, CAPILLARY
GLUCOSE-CAPILLARY: 99 mg/dL (ref 70–99)
Glucose-Capillary: 95 mg/dL (ref 70–99)

## 2018-06-30 MED ORDER — ONDANSETRON 4 MG PO TBDP
4.0000 mg | ORAL_TABLET | Freq: Three times a day (TID) | ORAL | Status: DC | PRN
  Administered 2018-06-30 (×2): 4 mg via ORAL
  Filled 2018-06-30 (×2): qty 1

## 2018-06-30 MED ORDER — HYDROXYZINE HCL 50 MG PO TABS
50.0000 mg | ORAL_TABLET | Freq: Four times a day (QID) | ORAL | Status: DC | PRN
  Administered 2018-06-30: 50 mg via ORAL
  Filled 2018-06-30: qty 1

## 2018-06-30 NOTE — Progress Notes (Signed)
Patient ID: Todd [name], male   DOB: 1955-10-05, 62 y.o.   MRN: [id number] Per State regulations 482.30 this chart was reviewed for medical necessity with respect to the patient's admission/duration of stay.    Next review date: 07/04/18  Debarah Crape, BSN, RN-BC  Case Manager
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
## 2018-06-30 NOTE — [facility] Group Notes (Signed)
LCSW Group Therapy Note  06/30/2018 1:15pm  Type of Therapy and Topic:  Group Therapy: Avoiding Self-Sabotaging and Enabling Behaviors  Participation Level:  Did Not Attend--pt invited. Chose to remain in bed.   Description of Group:   In this group, patients will learn how to identify obstacles, self-sabotaging and enabling behaviors, as well as: what are they, why do we do them and what needs these behaviors meet. Discuss unhealthy relationships and how to have positive healthy boundaries with those that sabotage and enable. Explore aspects of self-sabotage and enabling in yourself and how to limit these self-destructive behaviors in everyday life.   Therapeutic Goals: 1. Patient will identify one obstacle that relates to self-sabotage and enabling behaviors 2. Patient will identify one personal self-sabotaging or enabling behavior they did prior to admission 3. Patient will state a plan to change the above identified behavior 4. Patient will demonstrate ability to communicate their needs through discussion and/or role play.   Summary of Patient Progress:   x  Therapeutic Modalities:   Cognitive Behavioral Therapy Person-Centered Therapy Motivational Interviewing   Avelina Laine, [place] 06/30/2018 10:51 AM

## 2018-06-30 NOTE — Progress Notes (Signed)
Recreation Therapy Notes  Date: 11.15.19 Time: 0930 Location: 300 Hall Dayroom  Group Topic: Stress Management  Goal Area(s) Addresses:  Patient will verbalize importance of using healthy stress management.  Patient will identify positive emotions associated with healthy stress management.   Intervention: Stress Management  Activity :  Guided Imagery.  LRT introduced the stress management technique of guided imagery.  LRT read a script that allowed patients to envision patients being on the beach enjoying the sounds of the waves.  Patients were to listen and follow along as script was read.  Education:  Stress Management, Discharge Planning.   Education Outcome: Acknowledges edcuation/In group clarification offered/Needs additional education  Clinical Observations/Feedback: Pt did not attend group.     Victorino Sparrow, LRT/CTRS         Ria Comment, Lila [place] A 06/30/2018 11:12 AM

## 2018-06-30 NOTE — Plan of Care (Signed)
  Problem: Education: Goal: Knowledge of [facility] Education information/materials will improve Outcome: Not Progressing Goal: Emotional status will improve Outcome: Not Progressing   

## 2018-07-01 DIAGNOSIS — G4700 Insomnia, unspecified: Secondary | ICD-10-CM

## 2018-07-01 LAB — BASIC METABOLIC PANEL
Anion gap: 11 (ref 5–15)
BUN: 23 mg/dL (ref 8–23)
CHLORIDE: 107 mmol/L (ref 98–111)
CO2: 22 mmol/L (ref 22–32)
CREATININE: 1.16 mg/dL (ref 0.61–1.24)
Calcium: 10.2 mg/dL (ref 8.9–10.3)
GFR calc Af Amer: >60 mL/min (ref >60)
GFR calc non Af Amer: >60 mL/min (ref >60)
GLUCOSE: 98 mg/dL (ref 70–99)
POTASSIUM: 4.1 mmol/L (ref 3.5–5.1)
SODIUM: 140 mmol/L (ref 135–145)

## 2018-07-01 MED ORDER — LORAZEPAM 0.5 MG PO TABS
0.5000 mg | ORAL_TABLET | Freq: Four times a day (QID) | ORAL | Status: DC | PRN
  Administered 2018-07-01 – 2018-07-03 (×5): 0.5 mg via ORAL
  Filled 2018-07-01 (×5): qty 1

## 2018-07-01 MED ORDER — LOPERAMIDE HCL 2 MG PO CAPS
2.0000 mg | ORAL_CAPSULE | ORAL | Status: DC | PRN
  Administered 2018-07-01 – 2018-07-03 (×3): 2 mg via ORAL
  Filled 2018-07-01 (×3): qty 1

## 2018-07-01 MED ORDER — LOPERAMIDE HCL 2 MG PO CAPS
4.0000 mg | ORAL_CAPSULE | Freq: Once | ORAL | Status: AC
  Administered 2018-07-01: 4 mg via ORAL
  Filled 2018-07-01 (×2): qty 2

## 2018-07-01 NOTE — Plan of Care (Signed)
  Problem: Activity: Goal: Sleeping patterns will improve Outcome: Progressing   Problem: Safety: Goal: Periods of time without injury will increase Outcome: Progressing   Problem: Activity: Goal: Sleeping patterns will improve Outcome: Progressing   Problem: Safety: Goal: Periods of time without injury will increase Outcome: Progressing

## 2018-07-01 NOTE — Plan of Care (Signed)
D: Patient presents needy, demanding. He has complaints of opioid withdrawal, and had several loose bowel movements today. He reports an episode of emesis, but did not save it. He is noncompliant with use of the wheelchair, which was given by the nurse on the prior shift to prevent a near fall. He makes frequent requests and is demeaning towards staff when they offer to help. He also repeatedly requests Ativan, "for withdrawal." On the other hand, he reports he is on the "upswing." His sleep was poor last night, and he tossed and turned.Patient denies SI/HI/AVH.  A: Patient checked q15 min, and checks reviewed. Reviewed medication changes with patient and educated on side effects. Educated patient on importance of attending group therapy sessions and educated on several coping skills. Encouarged participation in milieu through recreation therapy and attending meals with peers. Support and encouragement provided. Fluids offered. R: Patient receptive to education on medications, and is medication compliant. Patient contracts for safety on the unit.

## 2018-07-01 NOTE — Progress Notes (Signed)
D: Pt denies SI/HI/AV hallucinations. Patient observed in day room watching television. Patient was forthcoming in his assessment and defensive at times. Patient states his goal is to "get more motivated and become more social with others."  A: Pt was offered support and encouragement. Pt was given scheduled medications. Pt was encourage to attend groups. Q 15 minute checks were done for safety.  R:Pt attends groups and interacts well with peers and staff. Pt is taking medication. Pt has no complaints.Pt receptive to treatment and safety maintained on unit.

## 2018-07-01 NOTE — Progress Notes (Addendum)
[facility] MD Progress Note  07/01/2018 11:27 AM Todd [name]  MRN:  [id number] Subjective:  Patient reports persistent symptoms of WDL- mainly diarrhea, some nausea, anxiety. Endorses lingering depression. Denies suicidal ideations Objective : I have reviewed chart notes and have met with patient. 62 year old male, history of depression, alcohol use disorder, opiate use disorder. Presented for worsening depression, suicidal ideations ( passive ) and increasing ETOH abuse . Currently patient describes some lingering WDL symptoms, in particular diarrhea.  Currently mobilizing in wheel chair- states that at home he walks with cane. He currently presents comfortable and does not appear to be in any acute distress Endorses some persistent depression, but denies suicidal ideations at this time. Visible on unit, going to some groups. Denies medication side effects. 11/15 CBG 99    Principal Problem: MDD (major depressive disorder), recurrent severe, without psychosis ([place]) Diagnosis:   Patient Active Problem List   Diagnosis Date Noted  . Opioid use disorder, moderate, dependence ([place]) [F11.20] 06/29/2018  . Alcohol use disorder, severe, dependence ([place]) [F10.20] 06/27/2018  . MDD (major depressive disorder), recurrent severe, without psychosis ([place]) [F33.2] 06/26/2018  . Varicose veins of left lower extremity with complications [O64.314] 27/67/0110  . Benign paroxysmal positional vertigo [H81.10] 11/08/2013  . RBBB [I45.10] 06/02/2009  . 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 DM [E11.9] 04/11/2009  . OBESITY [E66.9] 04/11/2009  . DEPRESSION [F32.9] 04/11/2009  . HYPERTENSION [I10] 04/11/2009  . DIVERTICULAR DISEASE [K57.30] 04/11/2009  . CHOLECYSTITIS [K81.9] 04/11/2009  . CHEST PAIN [R07.9] 04/11/2009  . ABDOMINAL PAIN [R10.9] 04/11/2009   Total Time spent with patient: 15 minutes  Past Psychiatric History: see H&P  Past Medical History:  Past Medical History:  Diagnosis  Date  . Arthritis   . 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 Diabetes mellitus   . DVT (deep venous thrombosis) ([place]   . Hepatitis    hepatitis c, finished [facility] tx 3 months ago  . Hypercholesterolemia   . Hypertension   . Sleep apnea    not currently using cpap, mask causing vertigo    Past Surgical History:  Procedure Laterality Date  . COLONOSCOPY WITH PROPOFOL N/A 02/19/2014   Procedure: COLONOSCOPY WITH PROPOFOL;  Surgeon: MGarlan Fair MD;  Location: [facility] ENDOSCOPY;  Service: Endoscopy;  Laterality: N/A;  . ENDOVENOUS ABLATION SAPHENOUS VEIN W/ LASER Left 11/22/2017   endovenous laser ablation L SSV by JTinnie GensMD   . KB[place]Right    QUADS  . PROSTATE SURGERY     Family History:  Family History  Problem Relation Age of Onset  . Cancer Father        PROSTATE   Family Psychiatric  History: see H&P Social History:  Social History   Substance and Sexual Activity  Alcohol Use Yes   Comment: daily     Social History   Substance and Sexual Activity  Drug Use Not Currently   Comment: in recovery     Social History   Socioeconomic History  . Marital status: Married    Spouse name: Not on file  . Number of children: 2  . Years of education: 12th  . Highest education level: Not on file  Occupational History  . Occupation: post office  Social Needs  . Financial resource strain: Not on file  . Food insecurity:    Worry: Not on file    Inability: Not on file  . Transportation needs:    Medical: Not on file  Non-medical: Not on file  Tobacco Use  . Smoking status: Current Every Day Smoker    Packs/day: 0.50  . Smokeless tobacco: Never Used  . Tobacco comment: social smoking only  Substance and Sexual Activity  . Alcohol use: Yes    Comment: daily  . 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 Sexual activity: Yes  Lifestyle  . Physical activity:    Days per week: Not on file    Minutes per session: Not on file  . Stress: Not on  file  Relationships  . Social connections:    Talks on phone: Not on file    Gets together: Not on file    Attends religious service: Not on file    Active member of club or organization: Not on file    Attends meetings of clubs or organizations: Not on file    Relationship status: Not on file  Other Topics Concern  . Not on file  Social History Narrative   Patient lives at home alone.Marland KitchenMarland KitchenDrinks coffee daily    Additional Social History:   Sleep: improving  Appetite:  Fair  Current Medications: Current Facility-Administered Medications  Medication Dose Route Frequency Provider Last Rate Last Dose  . acetaminophen (TYLENOL) tablet 650 mg  650 mg Oral Q6H PRN Pennelope Bracken, MD   650 mg at 06/30/18 2225  . aspirin EC tablet 81 mg  81 mg Oral Daily Pennelope Bracken, MD   81 mg at 07/01/18 0913  . atorvastatin (LIPITOR) tablet 10 mg  10 mg Oral q1800 Pennelope Bracken, MD   10 mg at 06/30/18 1710  . cloNIDine (CATAPRES) tablet 0.1 mg  0.1 mg Oral BH-qamhs Pennelope Bracken, MD   0.1 mg at 07/01/18 0914   Followed by  . [START ON 07/02/2018] cloNIDine (CATAPRES) tablet 0.1 mg  0.1 mg Oral QAC breakfast Pennelope Bracken, MD      . dicyclomine (BENTYL) tablet 20 mg  20 mg Oral Q6H PRN Pennelope Bracken, MD   20 mg at 06/29/18 2036  . feeding supplement (ENSURE ENLIVE) (ENSURE ENLIVE) liquid 237 mL  237 mL Oral Daily PRN Sharma Covert, MD   237 mL at 06/28/18 2036  . hydrOXYzine (ATARAX/VISTARIL) tablet 50 mg  50 mg Oral Q6H PRN Pennelope Bracken, MD   50 mg at 06/30/18 0950  . ibuprofen (ADVIL,MOTRIN) tablet 800 mg  800 mg Oral Q6H PRN Pennelope Bracken, MD   800 mg at 06/30/18 0950  . losartan (COZAAR) tablet 50 mg  50 mg Oral Daily Elmarie Shiley A, NP   50 mg at 07/01/18 0913  . metFORMIN (GLUCOPHAGE) tablet 1,000 mg  1,000 mg Oral BID WC Pennelope Bracken, MD   1,000 mg at 07/01/18 0914  . methocarbamol (ROBAXIN)  tablet 500 mg  500 mg Oral Q8H PRN Pennelope Bracken, MD   500 mg at 06/29/18 2035  . multivitamin with minerals tablet 1 tablet  1 tablet Oral Daily Elmarie Shiley A, NP   1 tablet at 07/01/18 0912  . nicotine (NICODERM CQ - dosed in mg/24 hours) patch 21 mg  21 mg Transdermal Daily Pennelope Bracken, MD   21 mg at 07/01/18 0911  . ondansetron (ZOFRAN-ODT) disintegrating tablet 4 mg  4 mg Oral Q8H PRN Pennelope Bracken, MD   4 mg at 06/30/18 2011  . pneumococcal 23 valent vaccine (PNU-IMMUNE) injection 0.5 mL  0.5 mL Intramuscular Tomorrow-1000 [place],  Randa Ngo, MD      . thiamine (B-1) injection 100 mg  100 mg Intramuscular Once Elmarie Shiley A, NP      . thiamine (VITAMIN B-1) tablet 100 mg  100 mg Oral Daily Elmarie Shiley A, NP   100 mg at 07/01/18 0913  . traZODone (DESYREL) tablet 50 mg  50 mg Oral QHS Pennelope Bracken, MD   50 mg at 06/30/18 2140  . venlafaxine XR (EFFEXOR-XR) 24 hr capsule 75 mg  75 mg Oral Q breakfast Pennelope Bracken, MD   75 mg at 07/01/18 2751    Lab Results:  Results for orders placed or performed during the hospital encounter of 06/26/18 (from the past 48 hour(s))  Glucose, capillary     Status: None   Collection Time: 06/30/18 12:12 PM  Result Value Ref Range   Glucose-Capillary 95 70 - 99 mg/dL   Comment 1 Notify RN   Glucose, capillary     Status: None   Collection Time: 06/30/18  4:58 PM  Result Value Ref Range   Glucose-Capillary 99 70 - 99 mg/dL   Comment 1 Notify RN     Blood Alcohol level:  Lab Results  Component Value Date   ETH <10 06/26/2018   ETH <11 70/08/7492    Metabolic Disorder Labs:  No results found for: PROLACTIN Lab Results  Component Value Date   CHOL  03/11/2009    128        ATP III CLASSIFICATION:  <200     mg/dL   Desirable  200-239  mg/dL   Borderline High  >=240    mg/dL   High          TRIG 59 03/11/2009   HDL 45 03/11/2009   CHOLHDL 2.8 03/11/2009   VLDL 12 03/11/2009    LDLCALC  03/11/2009    71        Total Cholesterol/HDL:CHD Risk Coronary Heart Disease Risk Table                     Men   Women  1/2 Average Risk   3.4   3.3  Average Risk       5.0   4.4  2 X Average Risk   9.6   7.1  3 X Average Risk  23.4   11.0        Use the calculated Patient Ratio above and the CHD Risk Table to determine the patient's CHD Risk.        ATP III CLASSIFICATION (LDL):  <100     mg/dL   Optimal  100-129  mg/dL   Near or Above                    Optimal  130-159  mg/dL   Borderline  160-189  mg/dL   High  >190     mg/dL   Very High    Physical Findings: AIMS: Facial and Oral Movements Muscles of Facial Expression: None, normal Lips and Perioral Area: None, normal Jaw: None, normal Tongue: None, normal,Extremity Movements Upper (arms, wrists, hands, fingers): None, normal Lower (legs, knees, ankles, toes): None, normal, Trunk Movements Neck, shoulders, hips: None, normal, Overall Severity Severity of abnormal movements (highest score from questions above): None, normal Incapacitation due to abnormal movements: None, normal Patient's awareness of abnormal movements (rate only patient's report): No Awareness, Dental Status Current problems with teeth and/or dentures?: No Does patient usually wear dentures?: No  CIWA:  CIWA-Ar Total: 0 COWS:  COWS Total Score: 0  Musculoskeletal: Strength & Muscle Tone: within normal limits Gait & Station: normal Patient leans: N/A  Psychiatric Specialty Exam: Physical Exam  Nursing note and vitals reviewed.   Review of Systems  Constitutional: Negative for chills and fever.  Respiratory: Negative for cough and shortness of breath.   Cardiovascular: Negative for chest pain.  Gastrointestinal: Positive for abdominal pain and nausea. Negative for heartburn and vomiting.  Psychiatric/Behavioral: Positive for depression. Negative for hallucinations and suicidal ideas. The patient is nervous/anxious. The patient  does not have insomnia.   reports some nausea, no vomiting , endorses diarrhea  Blood pressure (!) 136/97, pulse 70, temperature 97.9 F (36.6 C), resp. rate 20, height _0  (1.727 m), weight 105.2 kg, SpO2 97 %.Body mass index is 35.28 kg/m.  General Appearance: Fairly Groomed  Eye Contact:  Good  Speech:  Normal Rate  Volume:  Normal  Mood:  depressed, but acknowledges some improvement   Affect:  Congruent and vaguely anxious   Thought Process:  Linear and Descriptions of Associations: Intact  Orientation:  Other:  fully alert and attentive, oriented x 3   Thought Content:  no hallucinations, no delusions, not internally preoccupied   Suicidal Thoughts:  No at this time denies suicidal or self injurious ideations, contracts for safety on unit , denies homicidal or violent ideations  Homicidal Thoughts:  No  Memory:  Immediate;   Fair Recent;   Fair Remote;   Fair  Judgement:  Fair  Insight:  Fair  Psychomotor Activity:  Normal- no tremors or psychomotor agitation noted - presents comfortable, in no acute distress, currently mobilizing in wheel chair   Concentration:  Concentration: improving  and Attention Span: improving   Recall:  [employer] of Knowledge:  Fair  Language:  Fair  Akathisia:  No  Handed:    AIMS (if indicated):     Assets:  Communication Skills Desire for Improvement Resilience Social Support  ADL's:  Intact  Cognition:  WNL  Sleep:  Number of Hours: 6.75    Assessment - 63 year old male, history of depression, alcohol use disorder, opiate use disorder. Presented for worsening depression, suicidal ideations ( passive ) and increasing ETOH abuse . Patient reports lingering symptoms of opiate WDL: nausea, diarrhea, no vomiting . He does not present with significant alcohol WDL symptoms at this time and no restlessness, diaphoresis or tremors are noted. Hear rate is  70bpm . Completing detox protocols .   Treatment Plan Summary: Daily contact with patient to  assess and evaluate symptoms and progress in treatment and Medication management  Treatment Plan reviewed as below today 11/16 Encourage group and milieu participation Encourage efforts to work on sobriety and relapse prevention Continue Effexor XR 75 mgrs QDAY for depression, anxiety Completing Clonidine detox protocol for opiate WDL Continue Lipitor 29m QDAY for hyperlipidemia, Losartan 50 mgrs QDAY for HTN, Glucophage 1000 mgrs BID for DM Continue symptomatic management for opiate WDL symptoms Check BMP Treatment team working on disposition planning options  FJenne Campus MD 07/01/2018, 11:27 AM   Patient ID: MJosephina Shih male   DOB: [birth year] 62y.o.   MRN: [id number]

## 2018-07-01 NOTE — B[facility] Group Notes (Signed)
[place] Group Notes:  (Nursing)  Date:  07/01/2018  Time:  130 PM Type of Therapy:  Nurse Education  Participation Level:  Active  Participation Quality:  Attentive  Affect:  Appropriate  Cognitive:  Alert  Insight:  Improving  Engagement in Group:  Improving  Modes of Intervention:  Discussion and Education  Summary of Progress/Problems: Nurse led group played a non competitive learning/communication board game that fosters listening skills as well as self expression.  Waymond Cera 07/01/2018, 5:47 PM

## 2018-07-01 NOTE — BHH Group Notes (Signed)
[place] Group Notes: (Clinical Social Work)   07/01/2018      Type of Therapy:  Group Therapy   Participation Level:  Did Not Attend despite MHT prompting; however, he did arrive when group was almost over and started telling his life story, was difficult to interrupt or redirect, so group went long and people listened to this.   Selmer Dominion, LCSW 07/01/2018, 12:50 PM

## 2018-07-02 MED ORDER — DICYCLOMINE HCL 10 MG PO CAPS
10.0000 mg | ORAL_CAPSULE | Freq: Three times a day (TID) | ORAL | Status: DC
  Administered 2018-07-03 – 2018-07-04 (×4): 10 mg via ORAL
  Filled 2018-07-02 (×11): qty 1

## 2018-07-02 MED ORDER — PANTOPRAZOLE SODIUM 20 MG PO TBEC
20.0000 mg | DELAYED_RELEASE_TABLET | Freq: Every day | ORAL | Status: DC
  Administered 2018-07-02 – 2018-07-04 (×3): 20 mg via ORAL
  Filled 2018-07-02 (×7): qty 1

## 2018-07-02 MED ORDER — DICYCLOMINE HCL 10 MG PO CAPS: 10.0000 mg | ORAL_CAPSULE | Freq: Three times a day (TID) | ORAL | Status: DC

## 2018-07-02 MED ORDER — DULOXETINE HCL 20 MG PO CPEP
20.0000 mg | ORAL_CAPSULE | Freq: Once | ORAL | Status: AC
  Administered 2018-07-02: 20 mg via ORAL
  Filled 2018-07-02 (×2): qty 1

## 2018-07-02 MED ORDER — TRAZODONE HCL 100 MG PO TABS
100.0000 mg | ORAL_TABLET | Freq: Every day | ORAL | Status: DC
  Administered 2018-07-02 – 2018-07-03 (×2): 100 mg via ORAL
  Filled 2018-07-02 (×4): qty 1

## 2018-07-02 MED ORDER — DULOXETINE HCL 60 MG PO CPEP
60.0000 mg | ORAL_CAPSULE | Freq: Every day | ORAL | Status: DC
  Administered 2018-07-03 – 2018-07-04 (×2): 60 mg via ORAL
  Filled 2018-07-02 (×5): qty 1

## 2018-07-02 NOTE — BHH Group Notes (Signed)
[place] LCSW Group Therapy Note  07/02/2018  9:00-10:00AM  Type of Therapy and Topic:  Group Therapy:  Adding Supports Including Being Your Own Support  Participation Level:  Active   Description of Group:  Patients in this group were introduced to the concept that additional supports including self-support are an essential part of recovery.  A song entitled "I Need Help!" was played and a group discussion was held in reaction to the idea of needing to add supports.  A song entitled "My Own Hero" was played and a group discussion ensued in which patients stated they could relate to the song and it inspired them to realize they have be willing to help themselves in order to succeed, because other people cannot achieve sobriety or stability for them.  We discussed adding a variety of healthy supports to address the various needs in their lives.  Therapeutic Goals: 1)  demonstrate the importance of being a part of one's own support system 2)  discuss reasons people in one's life may eventually be unable to be continually supportive  3)  identify the patient's current support system and   4)  elicit commitments to add healthy supports and to become more conscious of being self-supportive   Summary of Patient Progress:  The patient expressed that he needs to add "real friends" to his supports and later when he talked about having nothing to do during the day except use drugs, CSW suggested he may need to add specific activities.  He monopolized group once again at times, but was more easily redirected today.   Therapeutic Modalities:   Motivational Interviewing Activity  Maretta Los

## 2018-07-02 NOTE — Progress Notes (Signed)
D: Patient is alert and cooperative. Patient denies SI, HI, AVH, and verbally contracts for safety. Patient reports anxiety 8/10 and nausea. Patient requests PRN medication for pain and anxiety.    A: Scheduled medications administered per MD order. PRN pain and anxiety medication administered per MD order. Support provided. Patient educated on safety on the unit and medications. Routine safety checks every 15 minutes. Patient stated understanding to tell nurse about any new physical symptoms. Patient understands to tell staff of any needs.     R: No adverse drug reactions noted. Patient verbally contracts for safety. Patient remains safe at this time and will continue to monitor.

## 2018-07-02 NOTE — Progress Notes (Signed)
D: Patient reminded multiple times to use wheelchair or walker, as his gait is unstable. He is non-compliant with use, and frequently gets out of bed. Yellow non-skid socks and falls armband in place. A: Provided education and encouragement. R: Patient non-compliant.

## 2018-07-02 NOTE — Progress Notes (Signed)
D.  Pt pleasant on approach, no complaints voiced at this time.  Pt was positive for evening AA group, has been observed interacting appropriately with peers on the unit.  Pt utilizing walker tonight rather than wheelchair for ambulation.  Pt denies SI/HI/AVH at this time.  A.  Support and encouragement offered, medication given as ordered  R  Pt remains safe on the unit, will continue to monitor.

## 2018-07-02 NOTE — [facility] Group Notes (Signed)
[facility] Group Notes:  (Nursing/MHT/Case Management/Adjunct)  Date:  07/02/2018  Time:  4:41 PM  Type of Therapy:  Nurse Education  Participation Level:  Active  Participation Quality:  Intrusive, Inattentive, Monopolizing and Resistant  Affect:  Defensive, Irritable and Labile  Cognitive:  Disorganized  Insight:  Limited  Engagement in Group:  Off Topic and Resistant  Modes of Intervention:  Discussion and Education  Summary of Progress/Problems: This group focused on [place] 5 love languages. We discussed definitions of each of the different love languages. We also covered healthy boundaries in our relationships. The group was given homework to take a quiz and find out what their love language is and apply that to their relationships.  Lesli Albee 07/02/2018, 4:41 PM

## 2018-07-02 NOTE — Plan of Care (Signed)
D: Patient presents irritable, demanding, needy. He has frequent requests. He is disruptive during group therapy session and tried to override the instruction given by the RN to the patients. Would still recommend a PT consult, will place an order per protocol. He is rude to staff, stating "I will just report you to the doctor then" if he doesn't get the answer he is looking for. Patient denies SI/HI/AVH.  A: Patient checked q15 min, and checks reviewed. Reviewed medication changes with patient and educated on side effects. Educated patient on importance of attending group therapy sessions and educated on several coping skills. Encouarged participation in milieu through recreation therapy and attending meals with peers. Support and encouragement provided. Fluids offered. R: Patient receptive to education on medications, and is medication compliant. Patient contracts for safety on the unit. Patient set no goal for the day.

## 2018-07-02 NOTE — Progress Notes (Addendum)
[facility] MD Progress Note  07/02/2018 9:54 AM Todd [name]  MRN:  [id number]   Subjective: Patient reports today that he is still having some withdrawal symptoms of diarrhea.  He also states that he has been having some sleep.  He feels that it is due to the withdrawal symptoms.  Patient also reports that he is still feeling depressed and is asking if his medications can be increased for his depression.  Patient also asked if he can have Ativan for his diarrhea.  Patient is correct and only use of Ativan and that he does not for diarrhea.  Patient denies any suicidal or homicidal ideations and denies any hallucinations.  Patient states that he does have some issues with indigestion when he is outside of the hospital as well.  Objective: Patient's chart and findings reviewed and discussed with treatment team.  Patient presents in the milieu and is using a walker today and is interacting with peers and staff appropriately.  Patient has been attending groups and has been calm, pleasant, and cooperative.  Patient does appear to be med seeking for controlled substances as and asking for Ativan for diarrhea.  Patient is corrected only use of the medication.  Principal Problem: MDD (major depressive disorder), recurrent severe, without psychosis ([place]) Diagnosis:   Patient Active Problem List   Diagnosis Date Noted  . Opioid use disorder, moderate, dependence ([place]) [F11.20] 06/29/2018  . Alcohol use disorder, severe, dependence ([place]) [F10.20] 06/27/2018  . MDD (major depressive disorder), recurrent severe, without psychosis ([place]) [F33.2] 06/26/2018  . Varicose veins of left lower extremity with complications [U76.546] 50/35/4656  . Benign paroxysmal positional vertigo [H81.10] 11/08/2013  . RBBB [I45.10] 06/02/2009  . 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 DM [E11.9] 04/11/2009  . OBESITY [E66.9] 04/11/2009  . DEPRESSION [F32.9] 04/11/2009  . HYPERTENSION [I10] 04/11/2009  . DIVERTICULAR DISEASE  [K57.30] 04/11/2009  . CHOLECYSTITIS [K81.9] 04/11/2009  . CHEST PAIN [R07.9] 04/11/2009  . ABDOMINAL PAIN [R10.9] 04/11/2009   Total Time spent with patient: 30 minutes  Past Psychiatric History: See H&P  Past Medical History:  Past Medical History:  Diagnosis Date  . Arthritis   . 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 Diabetes mellitus   . DVT (deep venous thrombosis) ([place]   . Hepatitis    hepatitis c, finished harvoni tx 3 months ago  . Hypercholesterolemia   . Hypertension   . Sleep apnea    not currently using cpap, mask causing vertigo    Past Surgical History:  Procedure Laterality Date  . COLONOSCOPY WITH PROPOFOL N/A 02/19/2014   Procedure: COLONOSCOPY WITH PROPOFOL;  Surgeon: MGarlan Fair MD;  Location: [facility] ENDOSCOPY;  Service: Endoscopy;  Laterality: N/A;  . ENDOVENOUS ABLATION SAPHENOUS VEIN W/ LASER Left 11/22/2017   endovenous laser ablation L SSV by JTinnie GensMD   . [place]    QUADS  . PROSTATE SURGERY     Family History:  Family History  Problem Relation Age of Onset  . Cancer Father        PROSTATE   Family Psychiatric  History: See H&P Social History:  Social History   Substance and Sexual Activity  Alcohol Use Yes   Comment: daily     Social History   Substance and Sexual Activity  Drug Use Not Currently   Comment: in recovery     Social History   Socioeconomic History  . Marital status: Married    Spouse name: Not on file  .  Number of children: 2  . Years of education: 12th  . Highest education level: Not on file  Occupational History  . Occupation: post office  Social Needs  . Financial resource strain: Not on file  . Food insecurity:    Worry: Not on file    Inability: Not on file  . Transportation needs:    Medical: Not on file    Non-medical: Not on file  Tobacco Use  . Smoking status: Current Every Day Smoker    Packs/day: 0.50  . Smokeless tobacco: Never Used  . Tobacco comment: social smoking only   Substance and Sexual Activity  . Alcohol use: Yes    Comment: daily  . 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 Sexual activity: Yes  Lifestyle  . Physical activity:    Days per week: Not on file    Minutes per session: Not on file  . Stress: Not on file  Relationships  . Social connections:    Talks on phone: Not on file    Gets together: Not on file    Attends religious service: Not on file    Active member of club or organization: Not on file    Attends meetings of clubs or organizations: Not on file    Relationship status: Not on file  Other Topics Concern  . Not on file  Social History Narrative   Patient lives at home alone.Marland KitchenMarland KitchenDrinks coffee daily    Additional Social History:                         Sleep: Fair  Appetite:  Good  Current Medications: Current Facility-Administered Medications  Medication Dose Route Frequency Provider Last Rate Last Dose  . acetaminophen (TYLENOL) tablet 650 mg  650 mg Oral Q6H PRN Pennelope Bracken, MD   650 mg at 06/30/18 2225  . aspirin EC tablet 81 mg  81 mg Oral Daily Pennelope Bracken, MD   81 mg at 07/02/18 0827  . atorvastatin (LIPITOR) tablet 10 mg  10 mg Oral q1800 Pennelope Bracken, MD   10 mg at 07/01/18 1706  . cloNIDine (CATAPRES) tablet 0.1 mg  0.1 mg Oral QAC breakfast Pennelope Bracken, MD   0.1 mg at 07/02/18 0828  . dicyclomine (BENTYL) tablet 20 mg  20 mg Oral Q6H PRN Pennelope Bracken, MD   20 mg at 06/29/18 2036  . DULoxetine (CYMBALTA) DR capsule 20 mg  20 mg Oral Once Money, Lowry Ram, FNP      . [START ON 07/03/2018] DULoxetine (CYMBALTA) DR capsule 60 mg  60 mg Oral Daily Money, Lowry Ram, FNP      . feeding supplement (ENSURE ENLIVE) (ENSURE ENLIVE) liquid 237 mL  237 mL Oral Daily PRN Sharma Covert, MD   237 mL at 07/02/18 0828  . hydrOXYzine (ATARAX/VISTARIL) tablet 50 mg  50 mg Oral Q6H PRN Pennelope Bracken, MD   50 mg at 06/30/18 0950  .  ibuprofen (ADVIL,MOTRIN) tablet 800 mg  800 mg Oral Q6H PRN Pennelope Bracken, MD   800 mg at 07/01/18 2057  . loperamide (IMODIUM) capsule 2 mg  2 mg Oral PRN Pennelope Bracken, MD   2 mg at 07/01/18 1417  . LORazepam (ATIVAN) tablet 0.5 mg  0.5 mg Oral Q6H PRN , Myer Peer, MD   0.5 mg at 07/01/18 2057  . losartan (COZAAR) tablet 50 mg  50  mg Oral Daily Elmarie Shiley A, NP   50 mg at 07/02/18 0827  . metFORMIN (GLUCOPHAGE) tablet 1,000 mg  1,000 mg Oral BID WC Pennelope Bracken, MD   1,000 mg at 07/02/18 0827  . methocarbamol (ROBAXIN) tablet 500 mg  500 mg Oral Q8H PRN Pennelope Bracken, MD   500 mg at 06/29/18 2035  . multivitamin with minerals tablet 1 tablet  1 tablet Oral Daily Elmarie Shiley A, NP   1 tablet at 07/02/18 0827  . nicotine (NICODERM CQ - dosed in mg/24 hours) patch 21 mg  21 mg Transdermal Daily Pennelope Bracken, MD   21 mg at 07/02/18 0826  . ondansetron (ZOFRAN-ODT) disintegrating tablet 4 mg  4 mg Oral Q8H PRN Pennelope Bracken, MD   4 mg at 06/30/18 2011  . pantoprazole (PROTONIX) EC tablet 20 mg  20 mg Oral Daily Money, Travis B, FNP      . pneumococcal 23 valent vaccine (PNU-IMMUNE) injection 0.5 mL  0.5 mL Intramuscular Tomorrow-1000 Maris Berger T, MD      . thiamine (B-1) injection 100 mg  100 mg Intramuscular Once Elmarie Shiley A, NP      . thiamine (VITAMIN B-1) tablet 100 mg  100 mg Oral Daily Elmarie Shiley A, NP   100 mg at 07/02/18 0827  . traZODone (DESYREL) tablet 50 mg  50 mg Oral QHS Pennelope Bracken, MD   50 mg at 07/01/18 2058    Lab Results:  Results for orders placed or performed during the hospital encounter of 06/26/18 (from the past 48 hour(s))  Glucose, capillary     Status: None   Collection Time: 06/30/18 12:12 PM  Result Value Ref Range   Glucose-Capillary 95 70 - 99 mg/dL   Comment 1 Notify RN   Glucose, capillary     Status: None   Collection Time: 06/30/18  4:58 PM  Result Value  Ref Range   Glucose-Capillary 99 70 - 99 mg/dL   Comment 1 Notify RN   Basic metabolic panel     Status: None   Collection Time: 07/01/18  6:35 PM  Result Value Ref Range   Sodium 140 135 - 145 mmol/L   Potassium 4.1 3.5 - 5.1 mmol/L   Chloride 107 98 - 111 mmol/L   CO2 22 22 - 32 mmol/L   Glucose, Bld 98 70 - 99 mg/dL   BUN 23 8 - 23 mg/dL   Creatinine, Ser 1.16 0.61 - 1.24 mg/dL   Calcium 10.2 8.9 - 10.3 mg/dL   GFR calc non Af Amer >60 >60 mL/min   GFR calc Af Amer >60 >60 mL/min    Comment: (NOTE) The eGFR has been calculated using the CKD EPI equation. This calculation has not been validated in all clinical situations. eGFR's persistently <60 mL/min signify possible Chronic Kidney Disease.    Anion gap 11 5 - 15    Comment: Performed at [facility], [place] [address] [place], [place] [address]    Blood Alcohol level:  Lab Results  Component Value Date   [facility] <10 06/26/2018   ETH <11 85/92/9244    Metabolic Disorder Labs:  No results found for: PROLACTIN Lab Results  Component Value Date   CHOL  03/11/2009    128        ATP III CLASSIFICATION:  <200     mg/dL   Desirable  200-239  mg/dL   Borderline High  >=240    mg/dL  High          TRIG 59 03/11/2009   HDL 45 03/11/2009   CHOLHDL 2.8 03/11/2009   VLDL 12 03/11/2009   LDLCALC  03/11/2009    71        Total Cholesterol/HDL:CHD Risk Coronary Heart Disease Risk Table                     Men   Women  1/2 Average Risk   3.4   3.3  Average Risk       5.0   4.4  2 X Average Risk   9.6   7.1  3 X Average Risk  23.4   11.0        Use the calculated Patient Ratio above and the CHD Risk Table to determine the patient's CHD Risk.        ATP III CLASSIFICATION (LDL):  <100     mg/dL   Optimal  100-129  mg/dL   Near or Above                    Optimal  130-159  mg/dL   Borderline  160-189  mg/dL   High  >190     mg/dL   Very High    Physical Findings: AIMS: Facial and Oral  Movements Muscles of Facial Expression: None, normal Lips and Perioral Area: None, normal Jaw: None, normal Tongue: None, normal,Extremity Movements Upper (arms, wrists, hands, fingers): None, normal Lower (legs, knees, ankles, toes): None, normal, Trunk Movements Neck, shoulders, hips: None, normal, Overall Severity Severity of abnormal movements (highest score from questions above): None, normal Incapacitation due to abnormal movements: None, normal Patient's awareness of abnormal movements (rate only patient's report): No Awareness, Dental Status Current problems with teeth and/or dentures?: No Does patient usually wear dentures?: No  CIWA:  CIWA-Ar Total: 0 COWS:  COWS Total Score: 3  Musculoskeletal: Strength & Muscle Tone: within normal limits Gait & Station: unsteady Patient leans: N/A  Psychiatric Specialty Exam: Physical Exam  Nursing note and vitals reviewed. Constitutional: He is oriented to person, place, and time. He appears well-developed and well-nourished.  Cardiovascular: Normal rate.  Respiratory: Effort normal.  Musculoskeletal: Normal range of motion.  Neurological: He is alert and oriented to person, place, and time.  Skin: Skin is warm.    Review of Systems  Constitutional: Negative.   HENT: Negative.   Eyes: Negative.   Respiratory: Negative.   Cardiovascular: Negative.   Gastrointestinal: Negative.   Genitourinary: Negative.   Musculoskeletal: Negative.   Skin: Negative.   Neurological: Negative.   Endo/Heme/Allergies: Negative.   Psychiatric/Behavioral: Positive for depression and substance abuse. Negative for hallucinations and suicidal ideas.    Blood pressure (!) 147/86, pulse 64, temperature 98.9 F (37.2 C), temperature source Oral, resp. rate 20, height _0  (1.727 m), weight 105.2 kg, SpO2 97 %.Body mass index is 35.28 kg/m.  General Appearance: Casual  Eye Contact:  Good  Speech:  Clear and Coherent and Normal Rate  Volume:  Normal   Mood:  Depressed  Affect:  Congruent  Thought Process:  Coherent and Descriptions of Associations: Intact  Orientation:  Full (Time, Place, and Person)  Thought Content:  WDL  Suicidal Thoughts:  No  Homicidal Thoughts:  No  Memory:  Immediate;   Good Recent;   Good Remote;   Good  Judgement:  Fair  Insight:  Fair  Psychomotor Activity:  Normal  Concentration:  Concentration: Good and Attention  Span: Good  Recall:  Good  Fund of Knowledge:  Good  Language:  Good  Akathisia:  No  Handed:  Right  AIMS (if indicated):     Assets:  Communication Skills Desire for Improvement Resilience Social Support  ADL's:  Intact  Cognition:  WNL  Sleep:  Number of Hours: 6.75   Problems addressed MDD severe recurrent Alcohol use disorder severe Opioid use disorder moderate  Treatment Plan Summary: Daily contact with patient to assess and evaluate symptoms and progress in treatment, Medication management and Plan is to: Reviewed patient's medications and patient's symptoms.  Was considering increasing patient's Effexor to 250 mg, however, realize patient has some high blood pressure issues in his blood pressure has been elevated.  Unsure if blood pressure is due to withdrawal symptoms or due to history due to patient reporting having high blood pressure and he is also on medications for high blood pressure.  Discussed with pharmacy and Dr. Parke Poisson will discontinue Effexor and will start patient on Cymbalta. Discontinue Effexor XR Start Cymbalta 60 mg p.o. daily tomorrow for mood stability Continue clonidine detox protocol Continue Ativan as needed detox protocol Increase trazodone to 100 mg p.o. nightly as needed for insomnia Start patient on Protonix 20 mg p.o. daily for GI upset and indigestion Encourage patient to continue using clonidine detox protocol medications for his diarrhea as well. Encourage group therapy participation  Lewis Shock, FNP 07/02/2018, 9:54 AM   ..Agree with  NP Progress Note

## 2018-07-02 NOTE — Progress Notes (Signed)
Patient did attend the evening speaker AA meeting.  

## 2018-07-02 NOTE — Plan of Care (Signed)
  Problem: Education: Goal: Knowledge of [facility] Education information/materials will improve Outcome: Progressing Goal: Emotional status will improve Outcome: Progressing   Problem: Safety: Goal: Periods of time without injury will increase Outcome: Progressing   Patient oriented to the unit. Patient denies SI and is laughing in conversation. Patient remains safe and will continue to monitor.

## 2018-07-03 LAB — GLUCOSE, CAPILLARY
GLUCOSE-CAPILLARY: 105 mg/dL — AB (ref 70–99)
Glucose-Capillary: 100 mg/dL — ABNORMAL HIGH (ref 70–99)
Glucose-Capillary: 92 mg/dL (ref 70–99)

## 2018-07-03 NOTE — Progress Notes (Signed)
Recreation Therapy Notes  Date: 11.18.19 Time: 0930 Location: [address] Dayroom  Group Topic: Stress Management  Goal Area(s) Addresses:  Patient will verbalize importance of using healthy stress management.  Patient will identify positive emotions associated with healthy stress management.   Behavioral Response: Engaged  Intervention: Stress Management  Activity :  Guided Imagery.  LRT introduced the stress management technique of guided imagery.  LRT read a script that took patients on a journey through the forest.  Patients were to listen as the script was read to engage in the activity.  Education:  Stress Management, Discharge Planning.   Education Outcome: Acknowledges edcuation/In group clarification offered/Needs additional education  Clinical Observations/Feedback: Pt attended and participated in group.     Victorino Sparrow,  LRT/CTRS         Ria Comment, Kingstin Heims A 07/03/2018 11:20 AM

## 2018-07-03 NOTE — Plan of Care (Signed)
  Problem: Activity: Goal: Interest or engagement in activities will improve Outcome: Progressing   Problem: Coping: Goal: Ability to verbalize frustrations and anger appropriately will improve Outcome: Progressing   D: Pt alert and oriented on the unit. Pt denies SI/HI and AVH. Pt's goal for the day is "to work on discharge plan." A: Education, support and encouragement provided, q15 minute safety checks remain in effect. Medications administered per MD orders. R: No reactions/side effects to medicine noted. Pt denies any concerns at this time, and verbally contracts for safety. Pt ambulating on the unit with no issues. Pt remains safe on and off the unit.

## 2018-07-03 NOTE — Tx Team (Signed)
Interdisciplinary Treatment and Diagnostic Plan Update  07/03/2018 Time of Session: 0830AM Todd [name] MRN: [id number]  Principal Diagnosis: MDD (major depressive disorder), recurrent severe, without psychosis ([place])  Secondary Diagnoses: Principal Problem:   MDD (major depressive disorder), recurrent severe, without psychosis ([place]) Active Problems:   Alcohol use disorder, severe, dependence ([place])   Opioid use disorder, moderate, dependence ([place])   Current Medications:  Current Facility-Administered Medications  Medication Dose Route Frequency Provider Last Rate Last Dose  . acetaminophen (TYLENOL) tablet 650 mg  650 mg Oral Q6H PRN Pennelope Bracken, MD   650 mg at 07/03/18 1237  . aspirin EC tablet 81 mg  81 mg Oral Daily Pennelope Bracken, MD   81 mg at 07/03/18 0755  . atorvastatin (LIPITOR) tablet 10 mg  10 mg Oral q1800 Pennelope Bracken, MD   10 mg at 07/02/18 1825  . dicyclomine (BENTYL) capsule 10 mg  10 mg Oral TID Melynda Keller, NP   10 mg at 07/03/18 1209  . DULoxetine (CYMBALTA) DR capsule 60 mg  60 mg Oral Daily Money, Lowry Ram, FNP   60 mg at 07/03/18 0758  . feeding supplement (ENSURE ENLIVE) (ENSURE ENLIVE) liquid 237 mL  237 mL Oral Daily PRN Sharma Covert, MD   237 mL at 07/03/18 1235  . hydrOXYzine (ATARAX/VISTARIL) tablet 50 mg  50 mg Oral Q6H PRN Pennelope Bracken, MD   50 mg at 06/30/18 0950  . ibuprofen (ADVIL,MOTRIN) tablet 800 mg  800 mg Oral Q6H PRN Pennelope Bracken, MD   800 mg at 07/02/18 1654  . loperamide (IMODIUM) capsule 2 mg  2 mg Oral PRN Pennelope Bracken, MD   2 mg at 07/03/18 0856  . LORazepam (ATIVAN) tablet 0.5 mg  0.5 mg Oral Q6H PRN Cobos, Myer Peer, MD   0.5 mg at 07/03/18 1239  . losartan (COZAAR) tablet 50 mg  50 mg Oral Daily Elmarie Shiley A, NP   50 mg at 07/03/18 0755  . metFORMIN (GLUCOPHAGE) tablet 1,000 mg  1,000 mg Oral BID WC Pennelope Bracken, MD   1,000 mg at 07/03/18 0755   . multivitamin with minerals tablet 1 tablet  1 tablet Oral Daily Niel Hummer, NP   1 tablet at 07/03/18 0755  . nicotine (NICODERM CQ - dosed in mg/24 hours) patch 21 mg  21 mg Transdermal Daily Pennelope Bracken, MD   21 mg at 07/03/18 0801  . ondansetron (ZOFRAN-ODT) disintegrating tablet 4 mg  4 mg Oral Q8H PRN Pennelope Bracken, MD   4 mg at 06/30/18 2011  . pantoprazole (PROTONIX) EC tablet 20 mg  20 mg Oral Daily Money, Lowry Ram, FNP   20 mg at 07/03/18 0755  . pneumococcal 23 valent vaccine (PNU-IMMUNE) injection 0.5 mL  0.5 mL Intramuscular Tomorrow-1000 Maris Berger T, MD      . thiamine (B-1) injection 100 mg  100 mg Intramuscular Once Elmarie Shiley A, NP      . thiamine (VITAMIN B-1) tablet 100 mg  100 mg Oral Daily Elmarie Shiley A, NP   100 mg at 07/03/18 0755  . traZODone (DESYREL) tablet 100 mg  100 mg Oral QHS Money, Lowry Ram, FNP   100 mg at 07/02/18 2105   PTA Medications: Facility-Administered Medications Prior to Admission  Medication Dose Route Frequency Provider Last Rate Last Dose  . triamcinolone acetonide (KENALOG) 10 MG/ML injection 10 mg  10 mg Other Once Landis Martins, DPM      .  triamcinolone acetonide (KENALOG) 10 MG/ML injection 10 mg  10 mg Other Once Landis Martins, DPM       Medications Prior to Admission  Medication Sig Dispense Refill Last Dose  . aspirin EC 81 MG tablet Take 81 mg by mouth daily.   06/26/2018 at Unknown time  . atorvastatin (LIPITOR) 10 MG tablet   3 06/26/2018 at Unknown time  . losartan (COZAAR) 50 MG tablet Take 50 mg by mouth daily.  11 06/26/2018 at Unknown time  . metFORMIN (GLUCOPHAGE) 1000 MG tablet Take 1,000 mg by mouth 2 (two) times daily with a meal.   06/26/2018 at Unknown time  . Multiple Vitamin (MULTIVITAMIN WITH MINERALS) TABS tablet Take 1 tablet by mouth daily.   06/26/2018 at Unknown time  . ONETOUCH VERIO test strip   11 Taking  . venlafaxine (EFFEXOR) 100 MG tablet Take 100 mg by mouth 2  (two) times daily.   06/26/2018 at Unknown time    Patient Stressors: Health problems Medication change or noncompliance Substance abuse  Patient Strengths: Ability for insight Average or above average intelligence Capable of independent living [employer] of knowledge Motivation for treatment/growth  Treatment Modalities: Medication Management, Group therapy, Case management,  1 to 1 session with clinician, Psychoeducation, Recreational therapy.   Physician Treatment Plan for Primary Diagnosis: MDD (major depressive disorder), recurrent severe, without psychosis ([place]) Long Term Goal(s): Improvement in symptoms so as ready for discharge Improvement in symptoms so as ready for discharge   Short Term Goals: Ability to identify and develop effective coping behaviors will improve Ability to identify triggers associated with substance abuse/mental health issues will improve  Medication Management: Evaluate patient's response, side effects, and tolerance of medication regimen.  Therapeutic Interventions: 1 to 1 sessions, Unit Group sessions and Medication administration.  Evaluation of Outcomes: Progressing  Physician Treatment Plan for Secondary Diagnosis: Principal Problem:   MDD (major depressive disorder), recurrent severe, without psychosis ([place]) Active Problems:   Alcohol use disorder, severe, dependence ([place])   Opioid use disorder, moderate, dependence ([place])  Long Term Goal(s): Improvement in symptoms so as ready for discharge Improvement in symptoms so as ready for discharge   Short Term Goals: Ability to identify and develop effective coping behaviors will improve Ability to identify triggers associated with substance abuse/mental health issues will improve     Medication Management: Evaluate patient's response, side effects, and tolerance of medication regimen.  Therapeutic Interventions: 1 to 1 sessions, Unit Group sessions and Medication  administration.  Evaluation of Outcomes: Progressing   RN Treatment Plan for Primary Diagnosis: MDD (major depressive disorder), recurrent severe, without psychosis ([place]) Long Term Goal(s): Knowledge of disease and therapeutic regimen to maintain health will improve  Short Term Goals: Ability to remain free from injury will improve, Ability to verbalize frustration and anger appropriately will improve, Ability to verbalize feelings will improve and Ability to identify and develop effective coping behaviors will improve  Medication Management: RN will administer medications as ordered by provider, will assess and evaluate patient's response and provide education to patient for prescribed medication. RN will report any adverse and/or side effects to prescribing provider.  Therapeutic Interventions: 1 on 1 counseling sessions, Psychoeducation, Medication administration, Evaluate responses to treatment, Monitor vital signs and CBGs as ordered, Perform/monitor CIWA, COWS, AIMS and Fall Risk screenings as ordered, Perform wound care treatments as ordered.  Evaluation of Outcomes: Progressing   LCSW Treatment Plan for Primary Diagnosis: MDD (major depressive disorder), recurrent severe, without psychosis ([place]) Long Term Goal(s):  Safe transition to appropriate next level of care at discharge, Engage patient in therapeutic group addressing interpersonal concerns.  Short Term Goals: Engage patient in aftercare planning with referrals and resources, Facilitate patient progression through stages of change regarding substance use diagnoses and concerns and Identify triggers associated with mental health/substance abuse issues  Therapeutic Interventions: Assess for all discharge needs, 1 to 1 time with Social worker, Explore available resources and support systems, Assess for adequacy in community support network, Educate family and significant other(s) on suicide prevention, Complete Psychosocial  Assessment, Interpersonal group therapy.  Evaluation of Outcomes: Progressing   Progress in Treatment: Attending groups: Yes. Participating in groups: Yes. Taking medication as prescribed: Yes. Toleration medication: Yes. Family/Significant other contact made: Yes, individual(s) contacted:  pt's wife for collateral information and completed SPE. Patient understands diagnosis: Yes. Discussing patient identified problems/goals with staff: Yes. Medical problems stabilized or resolved: Yes. Denies suicidal/homicidal ideation: Yes. Issues/concerns per patient self-inventory: No. Other: n/a   New problem(s) identified: No, Describe:  n/a  New Short Term/Long Term Goal(s): detox, medication management for mood stabilization; elimination of SI thoughts; development of comprehensive mental wellness/sobriety plan.   Patient Goals:  "I need to get back on my medications and stay away from alcohol."   Discharge Plan or Barriers: Pt plans to return home and has follow-up in place at the [facility] for medication management and SAIOP. [facility] pamphlet, Mobile Crisis information, and AA/NA information provided to patient for additional community support and resources.   Reason for Continuation of Hospitalization: Anxiety Depression Medication stabilization Withdrawal symptoms  Estimated Length of Stay: Tuesday, 07/04/18  Attendees: Patient: 07/03/2018 1:22 PM  Physician: Dr. Nancy Fetter MD 07/03/2018 1:22 PM  Nursing: Estill Bamberg RN; Chrys Racer RN; Elberta Fortis.A, RN  07/03/2018 1:22 PM  RN Care Manager:x 07/03/2018 1:22 PM  Social Worker: Janice Norrie LCSW; Radonna Ricker, [place] 07/03/2018 1:22 PM  Recreational Therapist: x 07/03/2018 1:22 PM  Other: Lindell Spar NP 07/03/2018 1:22 PM  Other:  07/03/2018 1:22 PM  Other: 07/03/2018 1:22 PM    Scribe for Treatment Team: Marylee Floras, [place] 07/03/2018 1:22 PM
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
## 2018-07-03 NOTE — [facility] Group Notes (Signed)
Pt attended spiritual care group on grief and loss facilitated by chaplain Jerene Pitch   Group goal of establishing open and affirming space for members to recognize process of grief, normalize and support grief experience and provide psycho social education and grief support. Group opened with brief discussion and psycho-social ed around grief and loss in relationships and in relation to self - identifying life patterns, circumstances, changes connected to grief response. Group participated in facilitated process group around topic of grief.Group members engaged with Four Tasks of Grief as a framework for understanding grief journey.

## 2018-07-03 NOTE — Progress Notes (Signed)
OT Cancellation Note  Patient Details Name: Todd [name] MRN: [id number] DOB: Jan 01, 1956   Cancelled Treatment:    Reason Eval/Treat Not Completed: Other (comment) Pt presents fatigued, in bed, unwilling to work with OT this date. Attempted x3 to see pt engage in functional mobility, pt adamantly deferring stating "it does not matter because I am leaving tomorrow, you should've came earlier in the week". Will reattempt to see pt, recommended additional PT consult for knee pain.   Zenovia Jarred, MSOT, OTR/L Behavioral Health OT/ Acute Relief OT [facility] Office: [place] 07/03/2018, 3:45 PM

## 2018-07-04 ENCOUNTER — Encounter: Payer: Self-pay | Admitting: Psychiatry

## 2018-07-04 MED ORDER — TRAZODONE HCL 100 MG PO TABS: 100.0000 mg | ORAL_TABLET | Freq: Every day | ORAL | 0 refills | Status: DC

## 2018-07-04 MED ORDER — HYDROXYZINE HCL 50 MG PO TABS: 50.0000 mg | ORAL_TABLET | Freq: Four times a day (QID) | ORAL | 0 refills | Status: DC | PRN

## 2018-07-04 MED ORDER — DULOXETINE HCL 60 MG PO CPEP: 60.0000 mg | ORAL_CAPSULE | Freq: Every day | ORAL | 0 refills | Status: DC

## 2018-07-04 NOTE — BHH Suicide Risk Assessment (Signed)
[facility] Discharge Suicide Risk Assessment   Principal Problem: MDD (major depressive disorder), recurrent severe, without psychosis ([place]) Discharge Diagnoses: Principal Problem:   MDD (major depressive disorder), recurrent severe, without psychosis ([place]) Active Problems:   Alcohol use disorder, severe, dependence ([place])   Opioid use disorder, moderate, dependence ([place])   Total Time spent with patient: 30 minutes  Psychiatric Specialty Exam:   Blood pressure (!) 137/95, pulse 88, temperature 98.2 F (36.8 C), temperature source Oral, resp. rate 16, height 5\' 8"  (1.727 m), weight 105.2 kg, SpO2 97 %.Body mass index is 35.28 kg/m.   Mental Status Per Nursing Assessment::   On Admission:  Self-harm thoughts  Demographic Factors:  Male, Low socioeconomic status and Unemployed  Loss Factors: Decline in physical health and Financial problems/change in socioeconomic status  Historical Factors: Impulsivity  Risk Reduction Factors:   Sense of responsibility to family, Living with another person, especially a relative, Positive social support, Positive therapeutic relationship and Positive coping skills or problem solving skills  Continued Clinical Symptoms:  Depression:   Comorbid alcohol abuse/dependence Alcohol/Substance Abuse/Dependencies  Cognitive Features That Contribute To Risk:  None    Suicide Risk:  Minimal: No identifiable suicidal ideation.  Patients presenting with no risk factors but with morbid ruminations; may be classified as minimal risk based on the severity of the depressive symptoms  [place] Follow up on 07/05/2018.   Specialty:  Behavioral Health Why:  Appointment with Mr. Tamera Reason on Wednesday, 11/20 at 10:00AM for assessment (medication management and Substance abuse intensive outpatient program). Thank you.  Contact information: [address] [place] [address] [phone]           Plan Of  Care/Follow-up recommendations:  Activity:  as tolerated Diet:  normal Tests:  NA Other:  see above for [place], MD 07/04/2018, 8:41 AM
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
## 2018-07-04 NOTE — Progress Notes (Signed)
D:  Patient's self inventory sheet, patient sleeps good, no sleep medication.  Poor appetite, normal energy level, good concentration.  Denied depression and hopeless, anxiety 5.  Withdrawals, diarrhea, cramping, nausea.  Denied SI.  Physical problems, lightheaded, pain, headaches.  Physical pain, worst pain #5 in past 24 hours.  Pain medicine helpful.  Does have discharge plans. A:  Medications administered per MD orders.  Emotional support and encouragement given patient. R:  Denied SI and HI, contracts for safety.  Denied A/V hallucinations.  Safety maintained with 15 minute checks.

## 2018-07-04 NOTE — Progress Notes (Signed)
D: Patient observed in dayroom, frequently intrusive though this is not new behavior and is consistent with his baseline. Will interrupt staff when working with other patients. Patient states, "I need my meds now. I go to bed early." Patient's affect animated, mood neutral. Patient states he is leaving tomorrow and his wife has already made an appointment for them at [facility]. He states he knows he is mean when he drinks and he is lucky that she has stayed with him. Complains of shoulder pain and requests prns.   A: Medicated per orders, prn advil, ativan given. Medication education provided. Level III obs in place for safety. Emotional support offered. Patient encouraged to complete Suicide Safety Plan before discharge. Encouraged to attend and participate in unit programming.  Fall prevention plan in place and reviewed with patient as pt is a high fall risk.   R: Patient verbalizes understanding of POC, falls prevention education. On reassess, patient is resting in bed with a pain score of 3/10. Patient denies SI/HI/AVH and remains safe on level III obs. Will continue to monitor throughout the night.

## 2018-07-04 NOTE — Progress Notes (Signed)
Discharge:  Patient discharged home with family member.  Patient denied SI and HI.  Denied A/V hallucinations.  Suicide prevention information given and discussed with patient who stated he understood and had no questions.  Suicide prevention information My3 also given to patient.  Patient stated he received all his belongings, clothing, toiletries, prescriptions, misc items, etc.  Patient stated he appreciated all assistance received from [facility] staff. All required discharge information given to patient at discharge.

## 2018-07-04 NOTE — Progress Notes (Signed)
  [facility] Adult Case Management Discharge Plan :  Will you be returning to the same living situation after discharge:  Yes,  patient reports he is returning home with his wife At discharge, do you have transportation home?: Yes,  patient reports his cousin is picking him up at discharge Do you have the ability to pay for your medications: Yes,  Medicare  Release of information consent forms completed and in the chart;  Patient's signature needed at discharge.  Patient to Follow up at: [place]. Go on 07/05/2018.   Specialty:  Behavioral Health Why:  Appointment with Mr. Tamera Reason on Wednesday, 11/20 at 10:00AM for assessment (medication management and Substance abuse intensive outpatient program). Be sure to bring any discharge paperwork from this hospitalization.Thank you.  Contact information: [place] [address] [phone]           Next level of care provider has access to [place] and Suicide Prevention discussed: Yes,  with the patient's wife  Have you used any form of tobacco in the last 30 days? (Cigarettes, Smokeless Tobacco, Cigars, and/or Pipes): Yes  Has patient been referred to the Quitline?: Patient refused referral  Patient has been referred for addiction treatment: Yes  Marylee Floras, [place] 07/04/2018, 10:50 AM

## 2018-07-13 ENCOUNTER — Emergency Department (HOSPITAL_COMMUNITY): Payer: Medicare Other

## 2018-07-13 ENCOUNTER — Other Ambulatory Visit: Payer: Self-pay

## 2018-07-13 ENCOUNTER — Observation Stay (HOSPITAL_COMMUNITY)
Admission: EM | Admit: 2018-07-13 | Discharge: 2018-07-14 | Disposition: A | Discharge status: Home / Self Care | Payer: Medicare Other | Attending: Family Medicine | Admitting: Family Medicine

## 2018-07-13 ENCOUNTER — Encounter (HOSPITAL_COMMUNITY): Payer: Self-pay

## 2018-07-13 DIAGNOSIS — Z8546 Personal history of malignant neoplasm of prostate: Secondary | ICD-10-CM | POA: Insufficient documentation

## 2018-07-13 DIAGNOSIS — F17200 Nicotine dependence, unspecified, uncomplicated: Secondary | ICD-10-CM | POA: Insufficient documentation

## 2018-07-13 DIAGNOSIS — F10920 Alcohol use, unspecified with intoxication, uncomplicated: Secondary | ICD-10-CM | POA: Diagnosis not present

## 2018-07-13 DIAGNOSIS — Z7984 Long term (current) use of oral hypoglycemic drugs: Secondary | ICD-10-CM | POA: Insufficient documentation

## 2018-07-13 DIAGNOSIS — I2699 Other pulmonary embolism without acute cor pulmonale: Secondary | ICD-10-CM | POA: Diagnosis not present

## 2018-07-13 DIAGNOSIS — I10 Essential (primary) hypertension: Secondary | ICD-10-CM | POA: Diagnosis present

## 2018-07-13 DIAGNOSIS — F1910 Other psychoactive substance abuse, uncomplicated: Secondary | ICD-10-CM | POA: Diagnosis present

## 2018-07-13 DIAGNOSIS — R079 Chest pain, unspecified: Secondary | ICD-10-CM | POA: Diagnosis not present

## 2018-07-13 DIAGNOSIS — E669 Obesity, unspecified: Secondary | ICD-10-CM | POA: Insufficient documentation

## 2018-07-13 DIAGNOSIS — R0602 Shortness of breath: Secondary | ICD-10-CM | POA: Insufficient documentation

## 2018-07-13 DIAGNOSIS — F1120 Opioid dependence, uncomplicated: Secondary | ICD-10-CM | POA: Diagnosis not present

## 2018-07-13 DIAGNOSIS — F1010 Alcohol abuse, uncomplicated: Secondary | ICD-10-CM | POA: Diagnosis present

## 2018-07-13 DIAGNOSIS — Z7982 Long term (current) use of aspirin: Secondary | ICD-10-CM | POA: Diagnosis not present

## 2018-07-13 DIAGNOSIS — Z79899 Other long term (current) drug therapy: Secondary | ICD-10-CM | POA: Diagnosis not present

## 2018-07-13 DIAGNOSIS — F1020 Alcohol dependence, uncomplicated: Secondary | ICD-10-CM | POA: Diagnosis present

## 2018-07-13 DIAGNOSIS — E119 Type 2 diabetes mellitus without complications: Secondary | ICD-10-CM

## 2018-07-13 DIAGNOSIS — R0789 Other chest pain: Secondary | ICD-10-CM | POA: Diagnosis present

## 2018-07-13 HISTORY — DX: Major depressive disorder, single episode, unspecified: F32.9

## 2018-07-13 HISTORY — DX: Other pulmonary embolism without acute cor pulmonale: I26.99

## 2018-07-13 HISTORY — DX: Unspecified viral hepatitis C without hepatic coma: B19.20

## 2018-07-13 HISTORY — DX: Malignant neoplasm of prostate: C61

## 2018-07-13 HISTORY — DX: Type 2 diabetes mellitus without complications: E11.9

## 2018-07-13 HISTORY — DX: Depression, unspecified: F32.A

## 2018-07-13 LAB — CBC
HCT: 37.2 % — ABNORMAL LOW (ref 39.0–52.0)
Hemoglobin: 12.3 g/dL — ABNORMAL LOW (ref 13.0–17.0)
MCH: 31.9 pg (ref 26.0–34.0)
MCHC: 33.1 g/dL (ref 30.0–36.0)
MCV: 96.6 fL (ref 80.0–100.0)
Platelets: 240 10*3/uL (ref 150–400)
RBC: 3.85 MIL/uL — ABNORMAL LOW (ref 4.22–5.81)
RDW: 12.4 % (ref 11.5–15.5)
WBC: 11.9 10*3/uL — ABNORMAL HIGH (ref 4.0–10.5)
nRBC: 0 % (ref 0.0–0.2)

## 2018-07-13 LAB — BASIC METABOLIC PANEL
Anion gap: 15 (ref 5–15)
BUN: 17 mg/dL (ref 8–23)
CO2: 16 mmol/L — AB (ref 22–32)
Calcium: 8.6 mg/dL — ABNORMAL LOW (ref 8.9–10.3)
Chloride: 107 mmol/L (ref 98–111)
Creatinine, Ser: 1.07 mg/dL (ref 0.61–1.24)
GFR calc Af Amer: >60 mL/min (ref >60)
GFR calc non Af Amer: >60 mL/min (ref >60)
GLUCOSE: 188 mg/dL — AB (ref 70–99)
Potassium: 3.6 mmol/L (ref 3.5–5.1)
Sodium: 138 mmol/L (ref 135–145)

## 2018-07-13 LAB — ETHANOL: Alcohol, Ethyl (B): 197 mg/dL — ABNORMAL HIGH (ref <10)

## 2018-07-13 LAB — D-DIMER, QUANTITATIVE: D-Dimer, Quant: 2.61 ug/mL-FEU — ABNORMAL HIGH (ref 0.00–0.50)

## 2018-07-13 LAB — I-STAT TROPONIN, ED: Troponin i, poc: 0.03 ng/mL (ref 0.00–0.08)

## 2018-07-13 LAB — BRAIN NATRIURETIC PEPTIDE: B Natriuretic Peptide: 146.6 pg/mL — ABNORMAL HIGH (ref 0.0–100.0)

## 2018-07-13 MED ORDER — THIAMINE HCL 100 MG/ML IJ SOLN
100.0000 mg | Freq: Every day | INTRAMUSCULAR | Status: DC
  Administered 2018-07-14: 100 mg via INTRAVENOUS
  Filled 2018-07-13: qty 2

## 2018-07-13 MED ORDER — LORAZEPAM 2 MG/ML IJ SOLN
1.0000 mg | Freq: Once | INTRAMUSCULAR | Status: AC
  Administered 2018-07-14: 1 mg via INTRAVENOUS
  Filled 2018-07-13 (×2): qty 1

## 2018-07-13 MED ORDER — IOPAMIDOL (ISOVUE-370) INJECTION 76%
100.0000 mL | Freq: Once | INTRAVENOUS | Status: AC | PRN
  Administered 2018-07-13: 100 mL via INTRAVENOUS

## 2018-07-13 MED ORDER — IOPAMIDOL (ISOVUE-370) INJECTION 76%
INTRAVENOUS | Status: AC
  Filled 2018-07-13: qty 100

## 2018-07-13 MED ORDER — HEPARIN BOLUS VIA INFUSION
6000.0000 [IU] | Freq: Once | INTRAVENOUS | Status: AC
  Administered 2018-07-14: 6000 [IU] via INTRAVENOUS
  Filled 2018-07-13: qty 6000

## 2018-07-13 MED ORDER — HEPARIN (PORCINE) 25000 UT/250ML-% IV SOLN
2000.0000 [IU]/h | INTRAVENOUS | Status: DC
  Administered 2018-07-14: 1700 [IU]/h via INTRAVENOUS
  Administered 2018-07-14: 2000 [IU]/h via INTRAVENOUS
  Filled 2018-07-13 (×2): qty 250

## 2018-07-13 MED ORDER — SODIUM CHLORIDE 0.9 % IV SOLN
INTRAVENOUS | Status: DC
  Administered 2018-07-14: via INTRAVENOUS

## 2018-07-13 MED ORDER — LORAZEPAM 2 MG/ML IJ SOLN
1.0000 mg | Freq: Once | INTRAMUSCULAR | Status: AC
  Administered 2018-07-13: 1 mg via INTRAVENOUS
  Filled 2018-07-13: qty 1

## 2018-07-13 MED ORDER — FOLIC ACID 1 MG PO TABS
1.0000 mg | ORAL_TABLET | Freq: Every day | ORAL | Status: DC
  Administered 2018-07-14: 1 mg via ORAL
  Filled 2018-07-13: qty 1

## 2018-07-13 MED ORDER — FENTANYL CITRATE (PF) 100 MCG/2ML IJ SOLN
50.0000 ug | Freq: Once | INTRAMUSCULAR | Status: AC
  Administered 2018-07-13: 50 ug via INTRAVENOUS
  Filled 2018-07-13: qty 2

## 2018-07-13 NOTE — ED Notes (Signed)
Pt continues to yell at staff, unable to be redirected and calmed down

## 2018-07-13 NOTE — ED Provider Notes (Signed)
[facility] EMERGENCY DEPARTMENT Provider Note   CSN: [id number] Arrival date & time: 07/13/18  2057     History   Chief Complaint Chief Complaint  Patient presents with  . Chest Pain  . Alcohol Intoxication    HPI Todd [name] is a 62 y.o. male.  Patient is a 62 year old male with a history of alcohol and substance abuse, diabetes, depression, hypertension and hyperlipidemia who presents with chest pain and shortness of breath.  Apparently he recently was in rehab but got out earlier this week.  He states over the last 3 days he has been drinking alcohol as well as using methadone and heroin.  He says he drinks about 1/5 of wine today.  He states over the last 3 days he has had some pain in his chest although he points to the side of his rib cage in the lower rib cage bilaterally.  He also says is been short of breath for 3 days.  He denies any cough or fevers.  No vomiting.  He was noted to be a little bit hypoxic on room air by EMS with sats in the upper 80s.  He is placed on nasal cannula.  He was given nitroglycerin sublingual which dropped his blood pressure.  Reportedly by EMS he was hypertensive in route.     Past Medical History:  Diagnosis Date  . Arthritis   . 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 Diabetes mellitus   . DVT (deep venous thrombosis) ([place])   . Hepatitis    hepatitis c, finished [facility] tx 3 months ago  . Hypercholesterolemia   . Hypertension   . Sleep apnea    not currently using cpap, mask causing vertigo    Patient Active Problem List   Diagnosis Date Noted  . Opioid use disorder, moderate, dependence ([place]) 06/29/2018  . Alcohol use disorder, severe, dependence ([place]) 06/27/2018  . MDD (major depressive disorder), recurrent severe, without psychosis ([place]) 06/26/2018  . Varicose veins of left lower extremity with complications 65/78/4696  . Benign paroxysmal positional vertigo 11/08/2013  . RBBB 06/02/2009  . CONGENITAL HEART  DISEASE 06/02/2009  . DM 04/11/2009  . OBESITY 04/11/2009  . DEPRESSION 04/11/2009  . HYPERTENSION 04/11/2009  . DIVERTICULAR DISEASE 04/11/2009  . CHOLECYSTITIS 04/11/2009  . CHEST PAIN 04/11/2009  . ABDOMINAL PAIN 04/11/2009    Past Surgical History:  Procedure Laterality Date  . COLONOSCOPY WITH PROPOFOL N/A 02/19/2014   Procedure: COLONOSCOPY WITH PROPOFOL;  Surgeon: Garlan Fair, MD;  Location: [facility];  Service: Endoscopy;  Laterality: N/A;  . ENDOVENOUS ABLATION SAPHENOUS VEIN W/ LASER Left 11/22/2017   endovenous laser ablation L SSV by Tinnie Gens MD   . [place] Right    QUADS  . PROSTATE SURGERY          Home Medications    Prior to Admission medications   Medication Sig Start Date End Date Taking? Authorizing Provider  aspirin EC 81 MG tablet Take 81 mg by mouth daily.    [provider]  atorvastatin (LIPITOR) 10 MG tablet  11/19/17   [provider]  DULoxetine (CYMBALTA) 60 MG capsule Take 1 capsule (60 mg total) by mouth daily. 07/05/18   Pennelope Bracken, MD  hydrOXYzine (ATARAX/VISTARIL) 50 MG tablet Take 1 tablet (50 mg total) by mouth every 6 (six) hours as needed for anxiety. 07/04/18   Pennelope Bracken, MD  losartan (COZAAR) 50 MG tablet Take 50 mg by mouth  daily. 06/15/18   [provider]  metFORMIN (GLUCOPHAGE) 1000 MG tablet Take 1,000 mg by mouth 2 (two) times daily with a meal.    [provider]  Multiple Vitamin (MULTIVITAMIN WITH MINERALS) TABS tablet Take 1 tablet by mouth daily.    [provider]  traZODone (DESYREL) 100 MG tablet Take 1 tablet (100 mg total) by mouth at bedtime. 07/04/18   Pennelope Bracken, MD    Family History Family History  Problem Relation Age of Onset  . Cancer Father        PROSTATE    Social History Social History   Tobacco Use  . Smoking status: Current Every Day Smoker    Packs/day: 0.50  . Smokeless tobacco: Never Used  .  Tobacco comment: social smoking only  Substance Use Topics  . Alcohol use: Yes    Comment: daily  . 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   Review of Systems Review of Systems  Constitutional: Positive for fatigue. Negative for chills, diaphoresis and fever.  HENT: Negative for congestion, rhinorrhea and sneezing.   Eyes: Negative.   Respiratory: Positive for cough and shortness of breath. Negative for chest tightness.   Cardiovascular: Positive for chest pain. Negative for leg swelling.  Gastrointestinal: Negative for abdominal pain, blood in stool, diarrhea, nausea and vomiting.  Genitourinary: Negative for difficulty urinating, flank pain, frequency and hematuria.  Musculoskeletal: Negative for arthralgias and back pain.  Skin: Negative for rash.  Neurological: Negative for dizziness, speech difficulty, weakness, numbness and headaches.     Physical Exam Updated Vital Signs BP (!) 111/57   Pulse 84   Temp 99.6 F (37.6 C) (Oral)   Resp (!) 23   Ht 5\' 10"  (1.778 m)   Wt 105.2 kg   SpO2 96%   BMI 33.28 kg/m   Physical Exam  Constitutional: He is oriented to person, place, and time. He appears well-developed and well-nourished.  HENT:  Head: Normocephalic and atraumatic.  Eyes: Pupils are equal, round, and reactive to light.  Neck: Normal range of motion. Neck supple.  Cardiovascular: Normal rate and regular rhythm.  Murmur heard. Pulmonary/Chest: Effort normal and breath sounds normal. Tachypnea noted. No respiratory distress. He has no wheezes. He has no rales. He exhibits no tenderness.  Abdominal: Soft. Bowel sounds are normal. There is no tenderness. There is no rebound and no guarding.  Musculoskeletal: Normal range of motion.       Right lower leg: He exhibits edema.       Left lower leg: He exhibits edema.  1+ pitting edema bilateral  Lymphadenopathy:    He has no cervical adenopathy.    Neurological: He is alert and oriented to person, place, and time.  Skin: Skin is warm and dry. No rash noted.  Psychiatric: His mood appears anxious. He is agitated.     ED Treatments / Results  Labs (all labs ordered are listed, but only abnormal results are displayed) Labs Reviewed  BASIC METABOLIC PANEL - Abnormal; Notable for the following components:      Result Value   CO2 16 (*)    Glucose, Bld 188 (*)    Calcium 8.6 (*)    All other components within normal limits  CBC - Abnormal; Notable for the following components:   WBC 11.9 (*)    RBC 3.85 (*)    Hemoglobin 12.3 (*)  HCT 37.2 (*)    All other components within normal limits  D-DIMER, QUANTITATIVE (NOT AT [facility]) - Abnormal; Notable for the following components:   D-Dimer, Quant 2.61 (*)    All other components within normal limits  ETHANOL - Abnormal; Notable for the following components:   Alcohol, Ethyl (B) 197 (*)    All other components within normal limits  BRAIN NATRIURETIC PEPTIDE - Abnormal; Notable for the following components:   B Natriuretic Peptide 146.6 (*)    All other components within normal limits  CULTURE, BLOOD (ROUTINE X 2)  CULTURE, BLOOD (ROUTINE X 2)  RAPID URINE DRUG SCREEN, HOSP PERFORMED  I-STAT TROPONIN, ED    EKG EKG Interpretation  Date/Time:  Thursday July 13 2018 21:03:43 EST Ventricular Rate:  98 PR Interval:    QRS Duration: 138 QT Interval:  416 QTC Calculation: 532 R Axis:   -65 Text Interpretation:  Sinus rhythm Right bundle branch block Anteroseptal infarct, age indeterminate since last tracing no significant change Confirmed by Malvin Johns [phone]) on 07/13/2018 9:13:04 PM   Radiology Ct Angio Chest Pe W/cm &/or Wo Cm  Result Date: 07/13/2018 CLINICAL DATA:  Chest pain and shortness of breath. Positive D-dimer. EXAM: CT ANGIOGRAPHY CHEST WITH CONTRAST TECHNIQUE: Multidetector CT imaging of the chest was performed using the standard protocol during bolus  administration of intravenous contrast. Multiplanar CT image reconstructions and MIPs were obtained to evaluate the vascular anatomy. CONTRAST:  162mL ISOVUE-370 IOPAMIDOL (ISOVUE-370) INJECTION 76% COMPARISON:  06/07/2018 FINDINGS: Cardiovascular: Pulmonary emboli are seen in several segmental and subsegmental branches of the right upper, middle, and lower lobe pulmonary arteries. No lobar or main pulmonary artery emboli identified (no radiographic signs of right heart strain). No evidence of thoracic aortic aneurysm or dissection. Mediastinum/Nodes: No masses or pathologically enlarged lymph nodes identified. Lungs/Pleura: Pulmonary airspace disease is seen in the right lower lobe, suspicious for pulmonary infarct. Tiny right pleural effusion also noted. Upper abdomen: No acute findings. Musculoskeletal: No suspicious bone lesions identified. Several old bilateral rib fracture deformities are noted. Review of the MIP images confirms the above findings. IMPRESSION: Multiple pulmonary emboli in right upper, middle, and lower lobe segmental and subsegmental branches. Right lower lobe airspace disease, suspicious for pulmonary infarct. Tiny right pleural effusion. Critical Value/emergent results were called by telephone at the time of interpretation on 07/13/2018 at 11:21 pm to Dr. Threasa Beards Lopez Dentinger , who verbally acknowledged these results. Electronically Signed   By: Earle Gell M.D.   On: 07/13/2018 23:25   Dg Chest Port 1 View  Result Date: 07/13/2018 CLINICAL DATA:  Dyspnea EXAM: PORTABLE CHEST 1 VIEW COMPARISON:  10/05/2017 chest radiograph. FINDINGS: Stable cardiomediastinal silhouette with top-normal heart size. No pneumothorax. Small right pleural effusion. No left pleural effusion. Cephalization of the pulmonary vasculature without overt pulmonary edema. Mild right basilar atelectasis. IMPRESSION: 1. Small right pleural effusion. 2. Mild right basilar atelectasis. 3. Cephalization of the pulmonary  vasculature without overt pulmonary edema. Electronically Signed   By: Ilona Sorrel M.D.   On: 07/13/2018 21:46    Procedures Procedures (including critical care time)  Medications Ordered in ED Medications  iopamidol (ISOVUE-370) 76 % injection (has no administration in time range)  LORazepam (ATIVAN) injection 1 mg (1 mg Intravenous Not Given 07/13/18 2303)  fentaNYL (SUBLIMAZE) injection 50 mcg (has no administration in time range)  LORazepam (ATIVAN) injection 1 mg (1 mg Intravenous Given 07/13/18 2204)  iopamidol (ISOVUE-370) 76 % injection 100 mL (100 mLs Intravenous Contrast Given 07/13/18  2255)     Initial Impression / Assessment and Plan / ED Course  I have reviewed the triage vital signs and the nursing notes.  Pertinent labs & imaging results that were available during my care of the patient were reviewed by me and considered in my medical decision making (see chart for details).     Patient is a 62 year old male who presents intoxicated with some chest pain across his lower ribs and shortness of breath.  He was noted to be mildly hypoxic and is requiring nasal cannula oxygen.  His EKG does not show any acute ischemic changes.  His d-dimer was elevated.  CT scan was performed which shows evidence of subsegmental PEs with pulmonary infarct.  Patient will be admitted by Dr. Roel Cluck for further treatment.  CRITICAL CARE Performed by: Malvin Johns Total critical care time: 45 minutes Critical care time was exclusive of separately billable procedures and treating other patients. Critical care was necessary to treat or prevent imminent or life-threatening deterioration. Critical care was time spent personally by me on the following activities: development of treatment plan with patient and/or surrogate as well as nursing, discussions with consultants, evaluation of patient's response to treatment, examination of patient, obtaining history from patient or surrogate, ordering and  performing treatments and interventions, ordering and review of laboratory studies, ordering and review of radiographic studies, pulse oximetry and re-evaluation of patient's condition.   Final Clinical Impressions(s) / ED Diagnoses   Final diagnoses:  Alcoholic intoxication without complication ([place])  Other acute pulmonary embolism without acute cor pulmonale [facility])    ED Discharge Orders    None       Malvin Johns, MD 07/13/18 2332

## 2018-07-13 NOTE — H&P (Signed)
Todd [name] IRC:[id number] DOB: 1956-03-06 DOA: 07/13/2018     PCP: [name], MD   Outpatient Specialists:    CT surgery Dr. Cyndia Bent Patient arrived to ER on 07/13/18 at 2057  Patient coming from: home Lives alone,   Chief Complaint:  Chief Complaint  Patient presents with  . Chest Pain  . Alcohol Intoxication    HPI: Todd [name] is a 62 y.o. male with medical history significant of DVT, ETOh abuse, Ascending Aortic Aneurysm, OSA not compliant with CPAP, HTN, DM 2, HEP C states he has been treated, hx of prostate ca in remission    Presented with   right sided chest pain started today while a home associated with shortness of breath.  no N/V  no fever Hypoxic to high 805 PER ems Had a relapse 3 days  and drank fifth of wine. combative on arrival  Initially hypertensive and tachycardic Admits to again using methadone and heroin last time 3 days ago  Regarding pertinent Chronic problems:   hx of EtOH and Heroin abuse recently been in rehab Reports hx of DVT after prostate removal 6 years ago.  4.0 cm fusiform ascending aortic aneurysm followed by CT surgery   While in ER: Ct SHOWED PE The following Work up has been ordered so far:  Orders Placed This Encounter  Procedures  . Culture, blood (routine x 2)  . DG Chest Port 1 View  . CT Angio Chest PE W/Cm &/Or Wo Cm  . Basic metabolic panel  . CBC  . D-dimer, quantitative  . Ethanol  . Urine rapid drug screen (hosp performed)  . Brain natriuretic peptide  . Cardiac monitoring  . Saline Lock IV, Maintain IV access  . heparin per pharmacy consult  . Pulse oximetry, continuous  . I-stat troponin, ED  . ED EKG within 10 minutes  . EKG 12-Lead    Following Medications were ordered in ER: Medications  iopamidol (ISOVUE-370) 76 % injection (has no administration in time range)  LORazepam (ATIVAN) injection 1 mg (1 mg Intravenous Not Given 07/13/18 2303)  LORazepam (ATIVAN) injection 1 mg (1 mg  Intravenous Given 07/13/18 2204)  iopamidol (ISOVUE-370) 76 % injection 100 mL (100 mLs Intravenous Contrast Given 07/13/18 2255)    Significant initial  Findings: Abnormal Labs Reviewed  BASIC METABOLIC PANEL - Abnormal; Notable for the following components:      Result Value   CO2 16 (*)    Glucose, Bld 188 (*)    Calcium 8.6 (*)    All other components within normal limits  CBC - Abnormal; Notable for the following components:   WBC 11.9 (*)    RBC 3.85 (*)    Hemoglobin 12.3 (*)    HCT 37.2 (*)    All other components within normal limits  D-DIMER, QUANTITATIVE (NOT AT [facility]) - Abnormal; Notable for the following components:   D-Dimer, Quant 2.61 (*)    All other components within normal limits  ETHANOL - Abnormal; Notable for the following components:   Alcohol, Ethyl (B) 197 (*)    All other components within normal limits  BRAIN NATRIURETIC PEPTIDE - Abnormal; Notable for the following components:   B Natriuretic Peptide 146.6 (*)    All other components within normal limits     Lactic Acid, Venous No results found for: LATICACIDVEN  Na 138 K 3.6  Cr   Stable,  Lab Results  Component Value Date   CREATININE 1.07 07/13/2018   CREATININE 1.16 07/01/2018  CREATININE 0.91 06/26/2018    BNP 146 Trop 0.03  WBC  11.9  HG/HCT  stable,       Component Value Date/Time   HGB 12.3 (L) 07/13/2018 2127   HCT 37.2 (L) 07/13/2018 2127    Troponin (Point of Care Test) Recent Labs    07/13/18 2134  TROPIPOC 0.03    BNP (last 3 results) Recent Labs    07/13/18 2219  BNP 146.6*    ProBNP (last 3 results) No results for input(s): PROBNP in the last 8760 hours.    UA   ordered     CXR -  NON acute Ct chest - subsegmental PEs with pulmonary infarct. CTabd/pelvis - *nonacute  ECG:  Personally reviewed by me showing: HR : 98 Rhythm: NSR W RBBB    no evidence of ischemic changes QTC 532     ED Triage Vitals  Enc Vitals Group     BP 07/13/18 2107  (!) 98/52     Pulse Rate 07/13/18 2107 98     Resp 07/13/18 2107 (!) 30     Temp 07/13/18 2107 99.6 F (37.6 C)     Temp Source 07/13/18 2107 Oral     SpO2 07/13/18 2101 92 %     Weight 07/13/18 2105 231 lb 14.8 oz (105.2 kg)     Height 07/13/18 2105 5\' 10"  (1.778 m)     Head Circumference --      Peak Flow --      Pain Score 07/13/18 2105 10     Pain Loc --      Pain Edu? --      Excl. in [place]? --   TMAX(24)@       Latest  Blood pressure (!) 111/57, pulse 84, temperature 99.6 F (37.6 C), temperature source Oral, resp. rate (!) 23, height 5\' 10"  (1.778 m), weight 105.2 kg, SpO2 96 %.    Hospitalist was called for admission for Bilateral PE in the setting of alcohol intoxication   Review of Systems:    Pertinent positives include: chest pain,  shortness of breath at rest. abdominal pain Constitutional:  No weight loss, night sweats, Fevers, chills, fatigue, weight loss  HEENT:  No headaches, Difficulty swallowing,Tooth/dental problems,Sore throat,  No sneezing, itching, ear ache, nasal congestion, post nasal drip,  Cardio-vascular:  No Orthopnea, PND, anasarca, dizziness, palpitations.no Bilateral lower extremity swelling  GI:  No heartburn, indigestion, , nausea, vomiting, diarrhea, change in bowel habits, loss of appetite, melena, blood in stool, hematemesis Resp:  no No dyspnea on exertion, No excess mucus, no productive cough, No non-productive cough, No coughing up of blood.No change in color of mucus.No wheezing. Skin:  no rash or lesions. No jaundice GU:  no dysuria, change in color of urine, no urgency or frequency. No straining to urinate.  No flank pain.  Musculoskeletal:  No joint pain or no joint swelling. No decreased range of motion. No back pain.  Psych:  No change in mood or affect. No depression or anxiety. No memory loss.  Neuro: no localizing neurological complaints, no tingling, no weakness, no double vision, no gait abnormality, no slurred speech, no  confusion  All systems reviewed and apart from [place] all are negative  Past Medical History:   Past Medical History:  Diagnosis Date  . Arthritis   . 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 Diabetes mellitus   . DVT (deep venous thrombosis) ([place])   . Hepatitis    hepatitis c,  finished harvoni tx 3 months ago  . Hypercholesterolemia   . Hypertension   . Sleep apnea    not currently using cpap, mask causing vertigo      Past Surgical History:  Procedure Laterality Date  . COLONOSCOPY WITH PROPOFOL N/A 02/19/2014   Procedure: COLONOSCOPY WITH PROPOFOL;  Surgeon: Garlan Fair, MD;  Location: [facility] END[facility]COPY;  Service: Endoscopy;  Laterality: N/A;  . ENDOVENOUS ABLATION SAPHENOUS VEIN W/ LASER Left 11/22/2017   endovenous laser ablation L SSV by Tinnie Gens MD   . [place] Right    QUADS  . PROSTATE SURGERY      Social History:  Ambulatory   independently      reports that he has been smoking. He has been smoking about 0.50 packs per day. He has never used smokeless tobacco. He reports that he drinks alcohol. He reports that he has current or past drug history.     Family History:   Family History  Problem Relation Age of Onset  . Cancer Father        PROSTATE    Allergies: No Known Allergies   Prior to Admission medications   Medication Sig Start Date End Date Taking? Authorizing Provider  aspirin EC 81 MG tablet Take 81 mg by mouth daily.    [provider]  atorvastatin (LIPITOR) 10 MG tablet  11/19/17   [provider]  DULoxetine (CYMBALTA) 60 MG capsule Take 1 capsule (60 mg total) by mouth daily. 07/05/18   Pennelope Bracken, MD  hydrOXYzine (ATARAX/VISTARIL) 50 MG tablet Take 1 tablet (50 mg total) by mouth every 6 (six) hours as needed for anxiety. 07/04/18   Pennelope Bracken, MD  losartan (COZAAR) 50 MG tablet Take 50 mg by mouth daily. 06/15/18   [provider]  metFORMIN (GLUCOPHAGE) 1000 MG tablet Take 1,000 mg  by mouth 2 (two) times daily with a meal.    [provider]  Multiple Vitamin (MULTIVITAMIN WITH MINERALS) TABS tablet Take 1 tablet by mouth daily.    [provider]  traZODone (DESYREL) 100 MG tablet Take 1 tablet (100 mg total) by mouth at bedtime. 07/04/18   Pennelope Bracken, MD   Physical Exam: Blood pressure (!) 111/57, pulse 84, temperature 99.6 F (37.6 C), temperature source Oral, resp. rate (!) 23, height 5\' 10"  (1.778 m), weight 105.2 kg, SpO2 96 %. 1. General:  In  Acute distress, somewhat cooperative acutely intoxicated  Chronically ill  -appearing 2. Psychological: Alert and   Oriented  3. Head/ENT:    Dry Mucous Membranes                          Head Non traumatic, neck supple                          Poor Dentition 4. SKIN:  decreased Skin turgor,  Skin clean Dry and intact no rash, notable scars over the abdomen 5. Heart: Regular rate and rhythm no Murmur, no Rub or gallop 6. Lungs:  Clear to auscultation bilaterally, no wheezes or crackles   7. Abdomen: Soft,  non-tender, Non distended   obese  bowel sounds present 8. Lower extremities: no clubbing, cyanosis, or  edema 9. Neurologically Grossly intact, moving all 4 extremities equally   10. MSK: Normal range of motion   LABS:     Recent Labs  Lab 07/13/18 2127  WBC 11.9*  HGB 12.3*  HCT 37.2*  MCV 96.6  PLT 124   Basic Metabolic Panel: Recent Labs  Lab 07/13/18 2127  NA 138  K 3.6  CL 107  CO2 16*  GLUCOSE 188*  BUN 17  CREATININE 1.07  CALCIUM 8.6*     No results for input(s): AST, ALT, ALKPHOS, BILITOT, PROT, ALBUMIN in the last 168 hours. No results for input(s): LIPASE, AMYLASE in the last 168 hours. No results for input(s): AMMONIA in the last 168 hours.    HbA1C: No results for input(s): HGBA1C in the last 72 hours. CBG: No results for input(s): GLUCAP in the last 168 hours.    Urine analysis:    Component Value Date/Time   COLORURINE YELLOW 12/24/2009  1944   APPEARANCEUR CLEAR 12/24/2009 1944   LABSPEC 1.030 12/24/2009 1944   PHURINE 5.5 12/24/2009 1944   GLUCOSEU NEGATIVE 12/24/2009 1944   HGBUR SMALL (A) 12/24/2009 1944   BILIRUBINUR NEGATIVE 12/24/2009 1944   KETONESUR NEGATIVE 12/24/2009 1944   PROTEINUR NEGATIVE 12/24/2009 1944   UROBILINOGEN 0.2 12/24/2009 1944   NITRITE NEGATIVE 12/24/2009 1944   LEUKOCYTESUR SMALL (A) 12/24/2009 1944     Cultures:    Component Value Date/Time   SDES STOOL 12/30/2009 1602   SPECREQUEST IMMUNE:NORM 12/30/2009 1602   CULT NO GROWTH 12/24/2009 1945   REPTSTATUS 12/31/2009 FINAL 12/30/2009 1602     Radiological Exams on Admission: Ct Angio Chest Pe W/cm &/or Wo Cm  Result Date: 07/13/2018 CLINICAL DATA:  Chest pain and shortness of breath. Positive D-dimer. EXAM: CT ANGIOGRAPHY CHEST WITH CONTRAST TECHNIQUE: Multidetector CT imaging of the chest was performed using the standard protocol during bolus administration of intravenous contrast. Multiplanar CT image reconstructions and MIPs were obtained to evaluate the vascular anatomy. CONTRAST:  167mL ISOVUE-370 IOPAMIDOL (ISOVUE-370) INJECTION 76% COMPARISON:  06/07/2018 FINDINGS: Cardiovascular: Pulmonary emboli are seen in several segmental and subsegmental branches of the right upper, middle, and lower lobe pulmonary arteries. No lobar or main pulmonary artery emboli identified (no radiographic signs of right heart strain). No evidence of thoracic aortic aneurysm or dissection. Mediastinum/Nodes: No masses or pathologically enlarged lymph nodes identified. Lungs/Pleura: Pulmonary airspace disease is seen in the right lower lobe, suspicious for pulmonary infarct. Tiny right pleural effusion also noted. Upper abdomen: No acute findings. Musculoskeletal: No suspicious bone lesions identified. Several old bilateral rib fracture deformities are noted. Review of the MIP images confirms the above findings. IMPRESSION: Multiple pulmonary emboli in right  upper, middle, and lower lobe segmental and subsegmental branches. Right lower lobe airspace disease, suspicious for pulmonary infarct. Tiny right pleural effusion. Critical Value/emergent results were called by telephone at the time of interpretation on 07/13/2018 at 11:21 pm to Dr. Threasa Beards BELFI , who verbally acknowledged these results. Electronically Signed   By: Earle Gell M.D.   On: 07/13/2018 23:25   Dg Chest Port 1 View  Result Date: 07/13/2018 CLINICAL DATA:  Dyspnea EXAM: PORTABLE CHEST 1 VIEW COMPARISON:  10/05/2017 chest radiograph. FINDINGS: Stable cardiomediastinal silhouette with top-normal heart size. No pneumothorax. Small right pleural effusion. No left pleural effusion. Cephalization of the pulmonary vasculature without overt pulmonary edema. Mild right basilar atelectasis. IMPRESSION: 1. Small right pleural effusion. 2. Mild right basilar atelectasis. 3. Cephalization of the pulmonary vasculature without overt pulmonary edema. Electronically Signed   By: Ilona Sorrel M.D.   On: 07/13/2018 21:46    Chart has been reviewed    Assessment/Plan   62 y.o. male with medical history significant of DVT,  ETOh abuse, Ascending Aortic Aneurysm, OSA not compliant with CPAP, HTN, DM 2, HEP C states he has been treated, hx of prostate ca in remission      Admitted for luminary embolism in the setting of alcohol intoxication  Present on Admission: . Pulmonary embolus and infarction ([place]) -  Admit to  stepdown Initiate heparin drip  Would likely benefit from case manager consult for long term anticoagulation Hold home blood pressure medications avoid hypotension Cycle cardiac enzymes Order echogram and lower extremity Dopplers  Most likely risk factors for hypercoagulable state being  obesity   hx of   malignancy   Would benefit from   workup as an outpatient with emphasis on evaluation for recurrence of prior malignancy   History of sleep apnea patient at this point willing to use  CPAP . OBESITY - chronic ongoing . Essential hypertension -soft blood pressures transiently and evidence of PE will hold blood pressure medications for now  . CHEST PAIN in the setting of pulmonary embolism for prognostication purposes we will cycle cardiac enzymes monitor on telemetry . Alcohol use disorder, severe, dependence ([place]) x-rays for severe withdrawal will monitor in stepdown order CIWA protocol administer thiamine . Opioid use disorder, moderate, dependence ([place]) -significant comorbidity recently undergone rehab but relapsed  DM 2-  - Order moderate SSI   -not on home insulin regimen    -  check TSH and HgA1C  - Hold by mouth medications    Order social work consult   Other plan as per orders.  DVT prophylaxis: heparin    Code Status:  FULL CODE  as per patient  I had personally discussed CODE STATUS with patient   Family Communication:   Family not  at  Bedside   Disposition Plan:        To home once workup is complete and patient is stable                                      Social Work  consulted                   Consults called: none  Admission status:   Obs     Level of care          SDU tele indefinitely please discontinue once patient no longer qualifies      Lashia Niese 07/14/2018, 12:21 AM    [facility]  Pager [phone]   after 2 AM please page floor coverage PA If 7AM-7PM, please contact the day team taking care of the patient  [email or web address]  Password [email or web address]

## 2018-07-13 NOTE — ED Notes (Signed)
Pt returned from CT °

## 2018-07-13 NOTE — Progress Notes (Signed)
ANTICOAGULATION CONSULT NOTE - Initial Consult  Pharmacy Consult for heparin Indication: pulmonary embolus  No Known Allergies  Patient Measurements: Height: 5\' 10"  (177.8 cm) Weight: 231 lb 14.8 oz (105.2 kg) IBW/kg (Calculated) : 73 Heparin Dosing Weight: 95.4 kg  Vital Signs: Temp: 99.6 F (37.6 C) (11/28 2107) Temp Source: Oral (11/28 2107) BP: 132/78 (11/28 2330) Pulse Rate: 84 (11/28 2315)  Labs: Recent Labs    07/13/18 2127  HGB 12.3*  HCT 37.2*  PLT 240  CREATININE 1.07    Estimated Creatinine Clearance: 87 mL/min (by C-G formula based on SCr of 1.07 mg/dL).   Medical History: Past Medical History:  Diagnosis Date  . Arthritis   . Cancer ([place]) 69yrs ago   PROSTATE  . Diabetes mellitus   . DVT (deep venous thrombosis) ([place])   . Hepatitis    hepatitis c, finished harvoni tx 3 months ago  . Hypercholesterolemia   . Hypertension   . Sleep apnea    not currently using cpap, mask causing vertigo    Medications:  See medication history  Assessment: 62 yo man to start heparin for PE.  He was not on anticoagulation PTA.  Hg 12.3, PTLC 240 Goal of Therapy:  Heparin level 0.3-0.7 units/ml Monitor platelets by anticoagulation protocol: Yes   Plan:  Heparin 6000 unit bolus and drip at 1700 units/hr Check heparin level 6 hours after start Daily HL and CBC while on heparin Monitor for bleeding complications  Porschia Willbanks [place] 07/13/2018,11:40 PM

## 2018-07-13 NOTE — ED Triage Notes (Signed)
Pt from home for sudden onset right sided chest pain that started after an argument with his wife. Pt just returned from a 10 day rehab program, pt relapsed today, reports drinking fifth of wine today. Pt loud and combative upon arrival to ED. Initial BP 200/100, now 140/86 HR 104. Changes noted on EKG.

## 2018-07-14 ENCOUNTER — Observation Stay (HOSPITAL_BASED_OUTPATIENT_CLINIC_OR_DEPARTMENT_OTHER): Payer: Medicare Other

## 2018-07-14 ENCOUNTER — Encounter (HOSPITAL_COMMUNITY): Payer: Self-pay | Admitting: General Practice

## 2018-07-14 DIAGNOSIS — F1020 Alcohol dependence, uncomplicated: Secondary | ICD-10-CM | POA: Diagnosis not present

## 2018-07-14 DIAGNOSIS — I2699 Other pulmonary embolism without acute cor pulmonale: Secondary | ICD-10-CM

## 2018-07-14 DIAGNOSIS — R0789 Other chest pain: Secondary | ICD-10-CM | POA: Diagnosis not present

## 2018-07-14 DIAGNOSIS — E669 Obesity, unspecified: Secondary | ICD-10-CM | POA: Diagnosis not present

## 2018-07-14 DIAGNOSIS — R079 Chest pain, unspecified: Secondary | ICD-10-CM | POA: Diagnosis not present

## 2018-07-14 DIAGNOSIS — E119 Type 2 diabetes mellitus without complications: Secondary | ICD-10-CM | POA: Diagnosis not present

## 2018-07-14 DIAGNOSIS — I10 Essential (primary) hypertension: Secondary | ICD-10-CM | POA: Diagnosis not present

## 2018-07-14 LAB — I-STAT VENOUS BLOOD GAS, ED
Acid-base deficit: 5 mmol/L — ABNORMAL HIGH (ref 0.0–2.0)
BICARBONATE: 19.4 mmol/L — AB (ref 20.0–28.0)
O2 Saturation: 79 %
TCO2: 20 mmol/L — ABNORMAL LOW (ref 22–32)
pCO2, Ven: 33.4 mmHg — ABNORMAL LOW (ref 44.0–60.0)
pH, Ven: 7.372 (ref 7.250–7.430)
pO2, Ven: 44 mmHg (ref 32.0–45.0)

## 2018-07-14 LAB — CBC
HCT: 35.5 % — ABNORMAL LOW (ref 39.0–52.0)
Hemoglobin: 11.5 g/dL — ABNORMAL LOW (ref 13.0–17.0)
MCH: 30.8 pg (ref 26.0–34.0)
MCHC: 32.4 g/dL (ref 30.0–36.0)
MCV: 95.2 fL (ref 80.0–100.0)
NRBC: 0 % (ref 0.0–0.2)
Platelets: 235 10*3/uL (ref 150–400)
RBC: 3.73 MIL/uL — ABNORMAL LOW (ref 4.22–5.81)
RDW: 12.6 % (ref 11.5–15.5)
WBC: 9.2 10*3/uL (ref 4.0–10.5)

## 2018-07-14 LAB — HEMOGLOBIN A1C
HEMOGLOBIN A1C: 8.8 % — AB (ref 4.8–5.6)
Mean Plasma Glucose: 205.86 mg/dL

## 2018-07-14 LAB — TSH: TSH: 2.614 u[IU]/mL (ref 0.350–4.500)

## 2018-07-14 LAB — URINALYSIS, ROUTINE W REFLEX MICROSCOPIC
Bilirubin Urine: NEGATIVE
Glucose, UA: NEGATIVE mg/dL
HGB URINE DIPSTICK: NEGATIVE
Ketones, ur: NEGATIVE mg/dL
Leukocytes, UA: NEGATIVE
Nitrite: NEGATIVE
Protein, ur: NEGATIVE mg/dL
Specific Gravity, Urine: 1.027 (ref 1.005–1.030)
pH: 5 (ref 5.0–8.0)

## 2018-07-14 LAB — COMPREHENSIVE METABOLIC PANEL
ALT: 57 U/L — ABNORMAL HIGH (ref 0–44)
ANION GAP: 7 (ref 5–15)
AST: 30 U/L (ref 15–41)
Albumin: 2.8 g/dL — ABNORMAL LOW (ref 3.5–5.0)
Alkaline Phosphatase: 128 U/L — ABNORMAL HIGH (ref 38–126)
BUN: 18 mg/dL (ref 8–23)
CALCIUM: 8.9 mg/dL (ref 8.9–10.3)
CO2: 27 mmol/L (ref 22–32)
Chloride: 102 mmol/L (ref 98–111)
Creatinine, Ser: 1.67 mg/dL — ABNORMAL HIGH (ref 0.61–1.24)
GFR calc Af Amer: 50 mL/min — ABNORMAL LOW (ref >60)
GFR calc non Af Amer: 43 mL/min — ABNORMAL LOW (ref >60)
Glucose, Bld: 225 mg/dL — ABNORMAL HIGH (ref 70–99)
Potassium: 3.5 mmol/L (ref 3.5–5.1)
Sodium: 136 mmol/L (ref 135–145)
Total Bilirubin: 0.8 mg/dL (ref 0.3–1.2)
Total Protein: 6.6 g/dL (ref 6.5–8.1)

## 2018-07-14 LAB — ECHOCARDIOGRAM COMPLETE
Height: 70 in
WEIGHTICAEL: 3710.78 [oz_av]

## 2018-07-14 LAB — HEPARIN LEVEL (UNFRACTIONATED)

## 2018-07-14 LAB — GLUCOSE, CAPILLARY
Glucose-Capillary: 143 mg/dL — ABNORMAL HIGH (ref 70–99)
Glucose-Capillary: 122 mg/dL — ABNORMAL HIGH (ref 70–99)
Glucose-Capillary: 142 mg/dL — ABNORMAL HIGH (ref 70–99)

## 2018-07-14 LAB — RAPID URINE DRUG SCREEN, HOSP PERFORMED
Amphetamines: NOT DETECTED
Barbiturates: NOT DETECTED
Benzodiazepines: NOT DETECTED
Cocaine: NOT DETECTED
Opiates: POSITIVE — AB
TETRAHYDROCANNABINOL: NOT DETECTED

## 2018-07-14 LAB — MAGNESIUM: MAGNESIUM: 1.8 mg/dL (ref 1.7–2.4)

## 2018-07-14 LAB — TROPONIN I
Troponin I: 0.03 ng/mL (ref <0.03)
Troponin I: 0.06 ng/mL (ref <0.03)
Troponin I: 0.03 ng/mL (ref <0.03)

## 2018-07-14 LAB — HIV ANTIBODY (ROUTINE TESTING W REFLEX): HIV Screen 4th Generation wRfx: NONREACTIVE

## 2018-07-14 LAB — MRSA PCR SCREENING: MRSA by PCR: NEGATIVE

## 2018-07-14 LAB — PHOSPHORUS: Phosphorus: 3.6 mg/dL (ref 2.5–4.6)

## 2018-07-14 MED ORDER — HYDROXYZINE HCL 25 MG PO TABS
50.0000 mg | ORAL_TABLET | Freq: Four times a day (QID) | ORAL | Status: DC | PRN
  Administered 2018-07-14: 50 mg via ORAL
  Filled 2018-07-14: qty 2

## 2018-07-14 MED ORDER — APIXABAN 5 MG PO TABS
10.0000 mg | ORAL_TABLET | Freq: Two times a day (BID) | ORAL | Status: DC
  Administered 2018-07-14: 10 mg via ORAL
  Filled 2018-07-14: qty 2

## 2018-07-14 MED ORDER — APIXABAN 5 MG PO TABS: 5.0000 mg | ORAL_TABLET | Freq: Two times a day (BID) | ORAL | Status: DC

## 2018-07-14 MED ORDER — TRAMADOL HCL 50 MG PO TABS
100.0000 mg | ORAL_TABLET | Freq: Four times a day (QID) | ORAL | Status: DC | PRN
  Administered 2018-07-14: 100 mg via ORAL
  Filled 2018-07-14: qty 2

## 2018-07-14 MED ORDER — ELIQUIS 5 MG VTE STARTER PACK: ORAL_TABLET | ORAL | 0 refills | Status: DC

## 2018-07-14 MED ORDER — INSULIN ASPART 100 UNIT/ML ~~LOC~~ SOLN
0.0000 [IU] | Freq: Three times a day (TID) | SUBCUTANEOUS | Status: DC
  Administered 2018-07-14: 2 [IU] via SUBCUTANEOUS

## 2018-07-14 MED ORDER — SODIUM CHLORIDE 0.9 % IV SOLN
INTRAVENOUS | Status: DC
  Administered 2018-07-14: 12:00:00 via INTRAVENOUS

## 2018-07-14 MED ORDER — FENTANYL CITRATE (PF) 100 MCG/2ML IJ SOLN
50.0000 ug | INTRAMUSCULAR | Status: DC | PRN
  Administered 2018-07-14 (×2): 50 ug via INTRAVENOUS
  Filled 2018-07-14 (×2): qty 2

## 2018-07-14 MED ORDER — ACETAMINOPHEN 650 MG RE SUPP: 650.0000 mg | Freq: Four times a day (QID) | RECTAL | Status: DC | PRN

## 2018-07-14 MED ORDER — TRAZODONE HCL 100 MG PO TABS
100.0000 mg | ORAL_TABLET | Freq: Every day | ORAL | Status: DC
  Administered 2018-07-14: 100 mg via ORAL
  Filled 2018-07-14: qty 1

## 2018-07-14 MED ORDER — ATORVASTATIN CALCIUM 10 MG PO TABS: 10.0000 mg | ORAL_TABLET | Freq: Every day | ORAL | Status: DC

## 2018-07-14 MED ORDER — ACETAMINOPHEN 325 MG PO TABS
650.0000 mg | ORAL_TABLET | Freq: Four times a day (QID) | ORAL | Status: DC | PRN
  Administered 2018-07-14: 650 mg via ORAL
  Filled 2018-07-14: qty 2

## 2018-07-14 MED ORDER — DULOXETINE HCL 60 MG PO CPEP
60.0000 mg | ORAL_CAPSULE | Freq: Every day | ORAL | Status: DC
  Administered 2018-07-14: 60 mg via ORAL
  Filled 2018-07-14: qty 1

## 2018-07-14 MED ORDER — HEPARIN BOLUS VIA INFUSION
3000.0000 [IU] | Freq: Once | INTRAVENOUS | Status: AC
  Administered 2018-07-14: 3000 [IU] via INTRAVENOUS
  Filled 2018-07-14: qty 3000

## 2018-07-14 MED ORDER — INSULIN ASPART 100 UNIT/ML ~~LOC~~ SOLN: 0.0000 [IU] | Freq: Every day | SUBCUTANEOUS | Status: DC

## 2018-07-14 MED ORDER — TRAMADOL HCL 50 MG PO TABS: 100.0000 mg | ORAL_TABLET | Freq: Four times a day (QID) | ORAL | 0 refills | Status: DC | PRN

## 2018-07-14 MED FILL — ELIQUIS STARTER PACK 5 MG T: 5 | 30 days supply | Qty: 74 | Fill #0

## 2018-07-14 NOTE — Discharge Instructions (Signed)
Information on my medicine - XARELTO (rivaroxaban)  This medication education was reviewed with me or my healthcare representative as part of my discharge preparation.  The pharmacist that spoke with me during my hospital stay was:  Dareen Piano, [place]? Xarelto was prescribed to treat blood clots that may have been found in the veins of your legs (deep vein thrombosis) or in your lungs (pulmonary embolism) and to reduce the risk of them occurring again.  What do you need to know about Xarelto? The starting dose is one 15 mg tablet taken TWICE daily with food for the FIRST 21 DAYS then on 07/21/18 the dose is changed to one 20 mg tablet taken ONCE A DAY with your evening meal.  DO NOT stop taking Xarelto without talking to the health care provider who prescribed the medication.  Refill your prescription for 20 mg tablets before you run out.  After discharge, you should have regular check-up appointments with your healthcare provider that is prescribing your Xarelto.  In the future your dose may need to be changed if your kidney function changes by a significant amount.  What do you do if you miss a dose? If you are taking Xarelto TWICE DAILY and you miss a dose, take it as soon as you remember. You may take two 15 mg tablets (total 30 mg) at the same time then resume your regularly scheduled 15 mg twice daily the next day.  If you are taking Xarelto ONCE DAILY and you miss a dose, take it as soon as you remember on the same day then continue your regularly scheduled once daily regimen the next day. Do not take two doses of Xarelto at the same time.   Important Safety Information Xarelto is a blood thinner medicine that can cause bleeding. You should call your healthcare provider right away if you experience any of the following: ? Bleeding from an injury or your nose that does not stop. ? Unusual colored urine (red or dark brown) or unusual  colored stools (red or black). ? Unusual bruising for unknown reasons. ? A serious fall or if you hit your head (even if there is no bleeding).  Some medicines may interact with Xarelto and might increase your risk of bleeding while on Xarelto. To help avoid this, consult your healthcare provider or pharmacist prior to using any new prescription or non-prescription medications, including herbals, vitamins, non-steroidal anti-inflammatory drugs (NSAIDs) and supplements.  This website has more information on Xarelto: [email or web address]

## 2018-07-14 NOTE — Progress Notes (Signed)
Echocardiogram 2D Echocardiogram has been performed.  Todd [name] 07/14/2018, 10:07 AM

## 2018-07-14 NOTE — Progress Notes (Signed)
LE venous duplex prelim: Acute DVT noted in the right peroneal veins. 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   Landry Mellow, RDMS, RVT   Called results to nurse

## 2018-07-14 NOTE — Care Management Note (Signed)
Case Management Note RN CM cross coverage for 6E [phone]  Patient Details  Name: Todd [name] MRN: [id number] Date of Birth: 07-13-1956  Subjective/Objective:   Pt admitted with PE                 Action/Plan: PTA pt lived at home noted pt to go home on Eliquis- spoke with [place]- who will plan on filling pt's starter pack here prior to discharge- have spoken to [facility] and requested MD to resend script to [place].  Pt has PCP- [name] to follow up with. No further CM needs noted.   Expected Discharge Date:  07/14/18               Expected Discharge Plan:  Home/Self Care  In-House Referral:  NA  Discharge planning Services  CM Consult, Medication Assistance  Post Acute Care Choice:  NA Choice offered to:  NA  DME Arranged:    DME Agency:     HH Arranged:    HH Agency:     Status of Service:  Completed, signed off  If discussed at [place] of Stay Meetings, dates discussed:    Discharge Disposition: home/self care   Additional Comments:  [name], RN 07/14/2018, 2:12 PM

## 2018-07-14 NOTE — ED Notes (Signed)
Made 2 attempts at blood draw, without success.

## 2018-07-14 NOTE — Discharge Summary (Signed)
Physician Discharge Summary  Todd [name] ION:[id number] DOB: 02/20/1956 DOA: 07/13/2018  PCP: [name], MD  Admit date: 07/13/2018 Discharge date: 07/14/2018  Admitted From: Home Disposition: Home   Recommendations for Outpatient Follow-up:  - Follow up with PCP in 1-2 weeks for diabetes management, HbA1c 8.8%. Also started on eliquis for DVT and PE.  - Follow up with urology for PSA testing. Concerned for occult recurrence of CA with recurrence of DVT, though no constitutional symptoms of this. - Continue substance abuse counseling  Home Health: None Equipment/Devices: None Discharge Condition: Stable CODE STATUS: Full Diet recommendation: Heart healthy, carb-modified  Brief/Interim Summary: Todd [name] is a 62 y.o. male with medical history significant of DVT, ETOh abuse, Ascending Aortic Aneurysm, OSA not compliant with CPAP, HTN, DM 2, HEP C states he has been treated, hx of prostate ca in remission    Presented with   right sided chest pain started today while a home associated with shortness of breath.  no N/V  no fever Hypoxic to high 805 PER ems Had a relapse 3 days  and drank fifth of wine. combative on arrival  Initially hypertensive and tachycardic Admits to again using methadone and heroin last time 3 days ago  Regarding pertinent Chronic problems:   hx of EtOH and Heroin abuse recently been in rehab Reports hx of DVT after prostate removal 6 years ago.  4.0 cm fusiform ascending aortic aneurysm followed by CT surgery   While in ER: Ct SHOWED PE The following Work up has been ordered so far:  Discharge Diagnoses:  Active Problems:   DM (diabetes mellitus), type 2 ([place])   OBESITY   Essential hypertension   CHEST PAIN   Alcohol use disorder, severe, dependence (HCC)   Opioid use disorder, moderate, dependence ([place])   Pulmonary embolus and infarction (HCC)  Right sided pulmonary emboli, acute right peroneal DVT, chronic left femoral DVT: No  significant DVT symptoms. PE causing pleuritic chest pain with troponins trending downward consistent with demand ischemia, 0.03 > 0.06 > 0.03. No hypoxia or hemodynamic instability. No RV strain on echocardiogram.  - Transition heparin to eliquis. CrCl 67ml/min. Care management consulted.   Substance abuse, alcoholism:  - Refer back to [facility]. Has been told he can't go back to behavioral health at this time.  - Social work consulted.   History of prostate CA: s/p resection.  - Follow up with Dr. Karsten Ro for surveillance, as he has not had this recently per pt, though PSA has remained 0 per pt. Concern for recurrence in setting of recurrent DVT.   T2DM: Uncontrolled with hyperglycemia.  - Continue home metformin which the patient is not entirely adherent to.  - PCP follow up  HTN: Increasing BP after holding medications. Restart home medications and follow up with PCP.  OSA: CPAP  Obesity: Lifestyle modifications advised.   Alcohol use disorder, severe, dependence and opioid use disorder, moderate, dependence:  - With recent relapse. Pt not willing to go back to inpatient rehabilitation but will follow up with [facility] as above.   Discharge Instructions Discharge Instructions    Call MD for:  difficulty breathing, headache or visual disturbances   Complete by:  As directed    Diet - low sodium heart healthy   Complete by:  As directed    Discharge instructions   Complete by:  As directed    It is critical that you start taking the blood thinner eliquis as directed. A starter pack has been sent  to your pharmacy. If you are unable to take this for any reason, please call Dr. Lina Sar office immediately.   It is also critical that you follow up with Dr. Karsten Ro for PSA testing/prostate cancer follow up. Sometimes blood clots are more likely when you have cancer, and since the last clots you had were around the time of prostate cancer diagnosis and treatment, I am worried  that this blood clot may be related to a recurrence of cancer, though there is no definite evidence of that at this time.   It is also critical that you stop using drugs and alcohol. Follow up with the [facility] for this.   Increase activity slowly   Complete by:  As directed      Allergies as of 07/14/2018   No Known Allergies     Medication List    TAKE these medications   aspirin EC 81 MG tablet Take 81 mg by mouth daily.   atorvastatin 10 MG tablet Commonly known as:  LIPITOR   DULoxetine 60 MG capsule Commonly known as:  CYMBALTA Take 1 capsule (60 mg total) by mouth daily.   ELIQUIS STARTER PACK 5 MG Tabs Take as directed on package: start with two-5mg  tablets twice daily for 7 days. On day 8, switch to one-5mg  tablet twice daily.   hydrOXYzine 50 MG tablet Commonly known as:  ATARAX/VISTARIL Take 1 tablet (50 mg total) by mouth every 6 (six) hours as needed for anxiety.   losartan 50 MG tablet Commonly known as:  COZAAR Take 50 mg by mouth daily.   metFORMIN 1000 MG tablet Commonly known as:  GLUCOPHAGE Take 1,000 mg by mouth 2 (two) times daily with a meal.   multivitamin with minerals Tabs tablet Take 1 tablet by mouth daily.   traZODone 100 MG tablet Commonly known as:  DESYREL Take 1 tablet (100 mg total) by mouth at bedtime.      Follow-up Information    [name], MD. Schedule an appointment as soon as possible for a visit in 1 week(s).   Specialty:  Internal Medicine Why:  for ongoing chronic disease management Contact information: [address] [place] [address] [phone]        [facility]. Schedule an appointment as soon as possible for a visit.   Specialty:  Behavioral Health Why:  for substance abuse therapy Contact information: [place] [address] [phone]        Kathie Rhodes, MD. Schedule an appointment as soon as possible for a visit.   Specialty:   Urology Why:  for follow up of PSA Contact information: [place] [address] [phone]          No Known Allergies  Consultations:  Pharmacy  Social work  Care management  Procedures/Studies: Ct Angio Chest Pe W/cm &/or Wo Cm  Result Date: 07/13/2018 CLINICAL DATA:  Chest pain and shortness of breath. Positive D-dimer. EXAM: CT ANGIOGRAPHY CHEST WITH CONTRAST TECHNIQUE: Multidetector CT imaging of the chest was performed using the standard protocol during bolus administration of intravenous contrast. Multiplanar CT image reconstructions and MIPs were obtained to evaluate the vascular anatomy. CONTRAST:  169mL ISOVUE-370 IOPAMIDOL (ISOVUE-370) INJECTION 76% COMPARISON:  06/07/2018 FINDINGS: Cardiovascular: Pulmonary emboli are seen in several segmental and subsegmental branches of the right upper, middle, and lower lobe pulmonary arteries. No lobar or main pulmonary artery emboli identified (no radiographic signs of right heart strain). No evidence of thoracic  aortic aneurysm or dissection. Mediastinum/Nodes: No masses or pathologically enlarged lymph nodes identified. Lungs/Pleura: Pulmonary airspace disease is seen in the right lower lobe, suspicious for pulmonary infarct. Tiny right pleural effusion also noted. Upper abdomen: No acute findings. Musculoskeletal: No suspicious bone lesions identified. Several old bilateral rib fracture deformities are noted. Review of the MIP images confirms the above findings. IMPRESSION: Multiple pulmonary emboli in right upper, middle, and lower lobe segmental and subsegmental branches. Right lower lobe airspace disease, suspicious for pulmonary infarct. Tiny right pleural effusion. Critical Value/emergent results were called by telephone at the time of interpretation on 07/13/2018 at 11:21 pm to Dr. Threasa Beards BELFI , who verbally acknowledged these results. Electronically Signed   By: Earle Gell M.D.   On: 07/13/2018 23:25   Dg Chest  Port 1 View  Result Date: 07/13/2018 CLINICAL DATA:  Dyspnea EXAM: PORTABLE CHEST 1 VIEW COMPARISON:  10/05/2017 chest radiograph. FINDINGS: Stable cardiomediastinal silhouette with top-normal heart size. No pneumothorax. Small right pleural effusion. No left pleural effusion. Cephalization of the pulmonary vasculature without overt pulmonary edema. Mild right basilar atelectasis. IMPRESSION: 1. Small right pleural effusion. 2. Mild right basilar atelectasis. 3. Cephalization of the pulmonary vasculature without overt pulmonary edema. Electronically Signed   By: Ilona Sorrel M.D.   On: 07/13/2018 21:46   Dg Shoulder Left  Result Date: 06/26/2018 CLINICAL DATA:  Pain following fall.  History of prostate carcinoma EXAM: LEFT SHOULDER - 2+ VIEW COMPARISON:  None. FINDINGS: Frontal, Y scapular, and axillary images were obtained. There is no acute fracture or dislocation. There is moderate osteoarthritic change in the acromioclavicular joint. Osteoarthritic change along the lateral humeral head is also noted; question prior trauma in this area. The glenohumeral joint appears unremarkable. There are no erosive changes or intra-articular calcifications. No blastic or lytic bone lesions evident. Benign cystic change noted in the proximal left humerus. Visualized left lung clear. IMPRESSION: No acute fracture or dislocation. Question old trauma involving the lateral left humeral head. Moderate osteoarthritic change in the acromioclavicular joint noted. No erosive changes. No neoplastic focus evident. Electronically Signed   By: Lowella Grip III M.D.   On: 06/26/2018 16:52    LE venous doppler: LE venous duplex prelim: Acute DVT noted in the right peroneal veins. 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  Echocardiogram 07/14/2018:  Study Conclusions  - Left ventricle: The cavity size was normal. Wall thickness was   increased in a pattern of moderate LVH. Systolic function was   normal. The  estimated ejection fraction was in the range of 55%   to 60%. Incoordinate septal motion. Doppler parameters are   consistent with abnormal left ventricular relaxation (grade 1   diastolic dysfunction). The E/e&' ratio is >15, suggesting   elevated LV filling pressure. - Aorta: Ascending aortic diameter: 40 mm (S). - Ascending aorta: The ascending aorta was mildly dilated. - Mitral valve: Mildly thickened leaflets . There was trivial   regurgitation. - Left atrium: Moderately dilated. - Right atrium: The atrium was mildly dilated. - Tricuspid valve: There was trivial regurgitation. - Pulmonary arteries: PA peak pressure: 21 mm Hg (S). - Inferior vena cava: The vessel was normal in size. The   respirophasic diameter changes were in the normal range (>= 50%),   consistent with normal central venous pressure.  Impressions:  - LVEF 55-60%, moderate LVH, incoordinate septal motion, grade 1   DD, elevated LV filling pressure, dilated aorta to 4.0 cm,   trivial MR, moderate LAE, mild  RAE, trivial TR, RVSP 21 mmHg,   normal IVC.  ------------------------------------------------------------------- Study data:  No prior study was available for comparison.  Study status:  Routine.  Procedure:  The patient reported no pain pre or post test. Transthoracic echocardiography. Image quality was adequate.  Study completion:  There were no complications. Echocardiography.  M-mode, complete 2D, spectral Doppler, and color Doppler.  Birthdate:  Patient birthdate: 07/06/1956.  Age:  Patient is 62 yr old.  Sex:  Gender: male.    BMI: 33.3 kg/m^2.  Blood pressure:     111/57  Patient status:  Inpatient.  Study date: Study date: 07/14/2018. Study time: 09:41 AM.  Location:  Bedside.   -------------------------------------------------------------------  ------------------------------------------------------------------- Left ventricle:  The cavity size was normal. Wall thickness was increased in a  pattern of moderate LVH. Systolic function was normal. The estimated ejection fraction was in the range of 55% to 60%. Incoordinate septal motion. Doppler parameters are consistent with abnormal left ventricular relaxation (grade 1 diastolic dysfunction). The E/e&' ratio is >15, suggesting elevated LV filling pressure.  ------------------------------------------------------------------- Aortic valve:   Structurally normal valve. Trileaflet. Cusp separation was normal.  Doppler:  Transvalvular velocity was within the normal range. There was no stenosis. There was no regurgitation.  ------------------------------------------------------------------- Aorta:  Ascending aorta: The ascending aorta was mildly dilated.  ------------------------------------------------------------------- Mitral valve:   Mildly thickened leaflets .  Doppler:  There was trivial regurgitation.    Peak gradient (D): 3 mm Hg.  ------------------------------------------------------------------- Left atrium:  Moderately dilated.  ------------------------------------------------------------------- Atrial septum:  No defect or patent foramen ovale was identified.   ------------------------------------------------------------------- Right ventricle:  The cavity size was normal. Wall thickness was normal. Systolic function was normal.  ------------------------------------------------------------------- Pulmonic valve:    The valve appears to be grossly normal. Doppler:  There was no significant regurgitation.  ------------------------------------------------------------------- Tricuspid valve:   Doppler:  There was trivial regurgitation.  ------------------------------------------------------------------- Pulmonary artery:   The main pulmonary artery was normal-sized.  ------------------------------------------------------------------- Right atrium:  The atrium was mildly  dilated.  ------------------------------------------------------------------- Pericardium:  There was no pericardial effusion.  ------------------------------------------------------------------- Systemic veins: Inferior vena cava: The vessel was normal in size. The respirophasic diameter changes were in the normal range (>= 50%), consistent with normal central venous pressure.  Subjective: Having right anterior chest pain with deep breaths at rest. No worse with exertion. Shortness of breath is modestly improved. No leg swelling currently, but waxes and wanes, no tenderness. Denies recent immobilization, long distance trips, steroids, or fever/chills/night sweats/unintentional weight loss.   Discharge Exam: Vitals:   07/14/18 0118 07/14/18 0417  BP: (!) 181/79 (!) 195/83  Pulse: 83 76  Resp: (!) 23 (!) 21  Temp: 98.2 F (36.8 C) 98.6 F (37 C)  SpO2:  97%   General: Pt is alert, awake, not in acute distress Cardiovascular: RRR, S1/S2 +, no rubs, no gallops Respiratory: CTA bilaterally, no wheezing, no rhonchi. Nonlabored on room air. Abdominal: Soft, NT, ND, bowel sounds + Extremities: Very minimal RLE edema, non tender negative homan's bilaterally, no cyanosis  Labs: BNP (last 3 results) Recent Labs    07/13/18 2219  BNP 235.5*   Basic Metabolic Panel: Recent Labs  Lab 07/13/18 2127 07/14/18 0557  NA 138 136  K 3.6 3.5  CL 107 102  CO2 16* 27  GLUCOSE 188* 225*  BUN 17 18  CREATININE 1.07 1.67*  CALCIUM 8.6* 8.9  MG  --  1.8  PHOS  --  3.6   Liver Function Tests: Recent Labs  Lab 07/14/18 0557  AST 30  ALT 57*  ALKPHOS 128*  BILITOT 0.8  PROT 6.6  ALBUMIN 2.8*   No results for input(s): LIPASE, AMYLASE in the last 168 hours. No results for input(s): AMMONIA in the last 168 hours. CBC: Recent Labs  Lab 07/13/18 2127 07/14/18 0824  WBC 11.9* 9.2  HGB 12.3* 11.5*  HCT 37.2* 35.5*  MCV 96.6 95.2  PLT 240 235   Cardiac Enzymes: Recent Labs   Lab 07/14/18 0038 07/14/18 0557 07/14/18 1120  TROPONINI 0.03* 0.06* 0.03*   BNP: Invalid input(s): POCBNP CBG: Recent Labs  Lab 07/14/18 0152 07/14/18 0830 07/14/18 1126  GLUCAP 143* 122* 142*   D-Dimer Recent Labs    07/13/18 2127  DDIMER 2.61*   Hgb A1c Recent Labs    07/14/18 0557  HGBA1C 8.8*   Lipid Profile No results for input(s): CHOL, HDL, LDLCALC, TRIG, CHOLHDL, LDLDIRECT in the last 72 hours. Thyroid function studies Recent Labs    07/14/18 0557  TSH 2.614   Anemia work up No results for input(s): VITAMINB12, FOLATE, FERRITIN, TIBC, IRON, RETICCTPCT in the last 72 hours. Urinalysis    Component Value Date/Time   COLORURINE YELLOW 07/13/2018 2306   APPEARANCEUR CLEAR 07/13/2018 2306   LABSPEC 1.027 07/13/2018 2306   PHURINE 5.0 07/13/2018 2306   GLUCOSEU NEGATIVE 07/13/2018 2306   HGBUR NEGATIVE 07/13/2018 2306   BILIRUBINUR NEGATIVE 07/13/2018 2306   KETONESUR NEGATIVE 07/13/2018 2306   PROTEINUR NEGATIVE 07/13/2018 2306   UROBILINOGEN 0.2 12/24/2009 1944   NITRITE NEGATIVE 07/13/2018 2306   LEUKOCYTESUR NEGATIVE 07/13/2018 2306    Microbiology Recent Results (from the past 240 hour(s))  MRSA PCR Screening     Status: None   Collection Time: 07/14/18  1:21 AM  Result Value Ref Range Status   MRSA by PCR NEGATIVE NEGATIVE Final    Comment:        The GeneXpert MRSA Assay (FDA approved for NASAL specimens only), is one component of a comprehensive MRSA colonization surveillance program. It is not intended to diagnose MRSA infection nor to guide or monitor treatment for MRSA infections. Performed at [facility], [place] [address], [place], [place] [address]     Time coordinating discharge: Approximately 40 minutes  Patrecia Pour, MD  [facility] 07/14/2018, 1:18 PM Pager [phone]

## 2018-07-14 NOTE — Progress Notes (Signed)
ANTICOAGULATION CONSULT NOTE  Pharmacy Consult for heparin Indication: pulmonary embolus  No Known Allergies  Patient Measurements: Height: 5\' 10"  (177.8 cm) Weight: 231 lb 14.8 oz (105.2 kg) IBW/kg (Calculated) : 73 Heparin Dosing Weight: 95.4 kg  Vital Signs: Temp: 98.6 F (37 C) (11/29 0417) Temp Source: Oral (11/29 0417) BP: 195/83 (11/29 0417) Pulse Rate: 76 (11/29 0417)  Labs: Recent Labs    07/13/18 2127 07/14/18 0038 07/14/18 0557 07/14/18 0824 07/14/18 1120  HGB 12.3*  --   --  11.5*  --   HCT 37.2*  --   --  35.5*  --   PLT 240  --   --  235  --   HEPARINUNFRC  --   --  <0.10*  --   --   CREATININE 1.07  --  1.67*  --   --   TROPONINI  --  0.03* 0.06*  --  0.03*    Estimated Creatinine Clearance: 55.7 mL/min (A) (by C-G formula based on SCr of 1.67 mg/dL (H)).   Medical History: Past Medical History:  Diagnosis Date  . Arthritis   . Cancer ([place]) 12yrs ago   PROSTATE  . Diabetes mellitus   . DVT (deep venous thrombosis) ([place])   . Hepatitis    hepatitis c, finished harvoni tx 3 months ago  . Hypercholesterolemia   . Hypertension   . Sleep apnea    not currently using cpap, mask causing vertigo    Assessment: 62 yo man on  heparin for PE and DVT (also with history of DVT and prostate CA). Pharmacy consulted to dose apixaban   Goal of Therapy:  Heparin level 0.3-0.7 units/ml Monitor platelets by anticoagulation protocol: Yes   Plan:  -apixaban 10mg  po bid for 7 days then 5mg  po bid -Will provide patient education  Hildred Laser, PharmD Clinical Pharmacist **Pharmacist phone directory can now be found on [email or web address] ([email or web address]).  Listed under [place].

## 2018-07-19 LAB — CULTURE, BLOOD (ROUTINE X 2)
Culture: NO GROWTH
Culture: NO GROWTH

## 2018-07-24 ENCOUNTER — Emergency Department (HOSPITAL_COMMUNITY)
Admission: EM | Admit: 2018-07-24 | Discharge: 2018-07-26 | Disposition: A | Discharge status: Home / Self Care | Payer: Medicare Other | Attending: Emergency Medicine | Admitting: Emergency Medicine

## 2018-07-24 ENCOUNTER — Emergency Department (HOSPITAL_COMMUNITY): Payer: Medicare Other

## 2018-07-24 ENCOUNTER — Encounter (HOSPITAL_COMMUNITY): Payer: Self-pay | Admitting: Emergency Medicine

## 2018-07-24 DIAGNOSIS — Y908 Blood alcohol level of 240 mg/100 ml or more: Secondary | ICD-10-CM | POA: Diagnosis not present

## 2018-07-24 DIAGNOSIS — R7989 Other specified abnormal findings of blood chemistry: Secondary | ICD-10-CM | POA: Diagnosis not present

## 2018-07-24 DIAGNOSIS — Z7901 Long term (current) use of anticoagulants: Secondary | ICD-10-CM | POA: Diagnosis not present

## 2018-07-24 DIAGNOSIS — F1010 Alcohol abuse, uncomplicated: Secondary | ICD-10-CM | POA: Insufficient documentation

## 2018-07-24 DIAGNOSIS — Z8659 Personal history of other mental and behavioral disorders: Secondary | ICD-10-CM | POA: Insufficient documentation

## 2018-07-24 DIAGNOSIS — R4182 Altered mental status, unspecified: Secondary | ICD-10-CM | POA: Diagnosis present

## 2018-07-24 LAB — I-STAT ARTERIAL BLOOD GAS, ED
Acid-base deficit: 2 mmol/L (ref 0.0–2.0)
Bicarbonate: 23 mmol/L (ref 20.0–28.0)
O2 Saturation: 92 %
PCO2 ART: 40.3 mmHg (ref 32.0–48.0)
Patient temperature: 97.6
TCO2: 24 mmol/L (ref 22–32)
pH, Arterial: 7.362 (ref 7.350–7.450)
pO2, Arterial: 66 mmHg — ABNORMAL LOW (ref 83.0–108.0)

## 2018-07-24 NOTE — ED Notes (Signed)
Attempted to gain IV access x2 without success. Ordering IV Team consult.

## 2018-07-24 NOTE — ED Provider Notes (Signed)
[facility] EMERGENCY DEPARTMENT Provider Note   CSN: [id number] Arrival date & time: 07/24/18  2258     History   Chief Complaint Chief Complaint  Patient presents with  . Altered Mental Status   Level 5 Caveat due to altered mental status HPI Todd [name] is a 62 y.o. male.  The history is provided by the EMS personnel. The history is limited by the condition of the patient.  Altered Mental Status   This is a new problem. Episode onset: unknown. The problem has not changed since onset.Associated symptoms include confusion and somnolence. Risk factors include alcohol intake and illicit drug use.  Patient presents from home for altered mental status.  Reportedly patient was seen several hours ago at baseline.  Then daughter found him at home "lethargic "she walked in and noted that food was burning.  There was smoke in the house.  Patient clearly had a "glazed look in his eyes "  Significant other than reported that this was normal for patient.  She reports that he abuses alcohol and opiates every night. No other details known on arrival   PMH-unknown Soc hx - unknown Home Medications    Prior to Admission medications   Not on File    Family History No family history on file.  Social History Social History   Tobacco Use  . Smoking status: Not on file  Substance Use Topics  . Alcohol use: Not on file  . Drug use: Not on file     Allergies   Patient has no known allergies.   Review of Systems Review of Systems  Unable to perform ROS: Mental status change  Psychiatric/Behavioral: Positive for confusion.     Physical Exam Updated Vital Signs BP 129/89 (BP Location: Right Arm)   Pulse 66   Temp 97.6 F (36.4 C) (Rectal)   Resp (!) 23   SpO2 93%   Physical Exam CONSTITUTIONAL: Somnolent but arousable, patient is ill-appearing HEAD: Normocephalic/atraumatic, no signs of trauma EYES: EOMI/PERRL ENMT: Mucous membranes moist NECK:  supple no meningeal signs SPINE/BACK:entire spine nontender CV: S1/S2 noted, no murmurs/rubs/gallops noted LUNGS: Wheezing bilaterally ABDOMEN: soft, nontender NEURO: Pt is somnolent but arousable.  He slurs his speech.  He is able to follow commands and move all extremities x4 EXTREMITIES: pulses normal/equal, full ROM, no visible trauma SKIN: warm, color normal PSYCH: unAble to assess  ED Treatments / Results  Labs (all labs ordered are listed, but only abnormal results are displayed) Labs Reviewed  COMPREHENSIVE METABOLIC PANEL - Abnormal; Notable for the following components:      Result Value   Potassium 3.0 (*)    Glucose, Bld 137 (*)    Albumin 3.2 (*)    All other components within normal limits  CBC WITH DIFFERENTIAL/PLATELET - Abnormal; Notable for the following components:   RBC 3.95 (*)    Hemoglobin 12.1 (*)    MCV 100.8 (*)    All other components within normal limits  URINALYSIS, COMPLETE (UACMP) WITH MICROSCOPIC - Abnormal; Notable for the following components:   Color, Urine STRAW (*)    All other components within normal limits  RAPID URINE DRUG SCREEN, HOSP PERFORMED - Abnormal; Notable for the following components:   Opiates POSITIVE (*)    All other components within normal limits  ETHANOL - Abnormal; Notable for the following components:   Alcohol, Ethyl (B) 291 (*)    All other components within normal limits  COOXEMETRY PANEL - Abnormal; Notable for  the following components:   Total hemoglobin 11.9 (*)    Carboxyhemoglobin 3.3 (*)    All other components within normal limits  BRAIN NATRIURETIC PEPTIDE - Abnormal; Notable for the following components:   B Natriuretic Peptide 203.4 (*)    All other components within normal limits  CBG MONITORING, ED - Abnormal; Notable for the following components:   Glucose-Capillary 124 (*)    All other components within normal limits  I-STAT ARTERIAL BLOOD GAS, ED - Abnormal; Notable for the following components:     pO2, Arterial 66.0 (*)    All other components within normal limits  AMMONIA  PROTIME-INR    EKG EKG Interpretation  Date/Time:  Monday July 24 2018 23:14:26 EST Ventricular Rate:  72 PR Interval:    QRS Duration: 145 QT Interval:  454 QTC Calculation: 497 R Axis:   -48 Text Interpretation:  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 FINDINGS: Mild cardiomegaly with central vascular congestion. No focal airspace disease or pleural effusion. No pneumothorax. IMPRESSION: Cardiomegaly with vascular congestion Electronically Signed   By: Donavan Foil M.D.   On: 07/24/2018 23:29    Procedures Procedures  CRITICAL CARE Performed by: Sharyon Cable Total critical care time: 30 minutes Critical care time was exclusive of separately billable procedures and treating other patients. Critical care was necessary to treat or prevent imminent or life-threatening deterioration. Critical care was time spent personally by me on the following activities: development of treatment plan with patient and/or surrogate as well as nursing, discussions with consultants, evaluation of patient's response to treatment, examination of patient, obtaining history from patient or surrogate, ordering and performing treatments and interventions, ordering and review of laboratory studies, ordering and review of radiographic studies, pulse oximetry and re-evaluation of patient's condition.   Medications Ordered in ED Medications  furosemide (LASIX) injection 40 mg (40 mg Intravenous Refused 07/25/18 0647)     Initial Impression / Assessment and Plan / ED Course  I have reviewed the triage vital signs and the nursing notes.  Pertinent labs & imaging results that were available during my  care of the patient were reviewed by me and considered in my medical decision making (see chart for details).     11:19 PM Patient in the ER for altered mental status.  Review of previous records reveals he does have a history of substance abuse and has had admissions previously.  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  Extensive labs and imaging has been ordered 11:51 PM Patient still appears confused and slurring his speech.  We will proceed with CT imaging of the head as he is on anticoagulation 12:17 AM Patient is now more alert.  He has been acting inappropriate with nursing staff.  We will continue to monitor, defer CT head for now 12:46 AM Labs reveal alcohol intoxication.  CO level is not significantly elevated. 1:54 AM Patient with mild elevation in BNP and congestion on chest x-ray.  He just had an echo done at the end of November that revealed a normal EF.  Will diurese while he is waiting in the ED.  Suspect most of his altered mental status is due to alcohol and drug abuse.  He is now resting  comfortably.  6:58 AM Patient more alert.  He was able to walk without assistance.  He is able take p.o.  He is not sure what happened, he did not know how he got in the ER.  Pt feels comfortable for discharge.  Strong suspicion this is all related to alcohol and substance abuse. Final Clinical Impressions(s) / ED Diagnoses   Final diagnoses:  Alcohol abuse    ED Discharge Orders    None       Ripley Fraise, MD 07/25/18 [phone]

## 2018-07-24 NOTE — ED Triage Notes (Signed)
Last seen normal was 4 hours ago, daughter found him "lethargic".  Wife says this is normal for pt, etoh and suspected opiates use every night.  Pt was discharged from rehab some time this week.  Pt uncooperative on scene, eyes open to verbal stimuli w/ "glazed look in eyes"

## 2018-07-25 DIAGNOSIS — F1010 Alcohol abuse, uncomplicated: Secondary | ICD-10-CM | POA: Diagnosis not present

## 2018-07-25 LAB — ETHANOL: ALCOHOL ETHYL (B): 291 mg/dL — AB (ref <10)

## 2018-07-25 LAB — CBC WITH DIFFERENTIAL/PLATELET
Abs Immature Granulocytes: 0.02 10*3/uL (ref 0.00–0.07)
BASOS ABS: 0 10*3/uL (ref 0.0–0.1)
Basophils Relative: 0 %
Eosinophils Absolute: 0.1 10*3/uL (ref 0.0–0.5)
Eosinophils Relative: 2 %
HCT: 39.8 % (ref 39.0–52.0)
Hemoglobin: 12.1 g/dL — ABNORMAL LOW (ref 13.0–17.0)
Immature Granulocytes: 0 %
Lymphocytes Relative: 43 %
Lymphs Abs: 2.8 10*3/uL (ref 0.7–4.0)
MCH: 30.6 pg (ref 26.0–34.0)
MCHC: 30.4 g/dL (ref 30.0–36.0)
MCV: 100.8 fL — ABNORMAL HIGH (ref 80.0–100.0)
Monocytes Absolute: 0.4 10*3/uL (ref 0.1–1.0)
Monocytes Relative: 6 %
Neutro Abs: 3.1 10*3/uL (ref 1.7–7.7)
Neutrophils Relative %: 49 %
PLATELETS: 282 10*3/uL (ref 150–400)
RBC: 3.95 MIL/uL — ABNORMAL LOW (ref 4.22–5.81)
RDW: 13.2 % (ref 11.5–15.5)
WBC: 6.4 10*3/uL (ref 4.0–10.5)
nRBC: 0 % (ref 0.0–0.2)

## 2018-07-25 LAB — URINALYSIS, COMPLETE (UACMP) WITH MICROSCOPIC
Bacteria, UA: NONE SEEN
Bilirubin Urine: NEGATIVE
Glucose, UA: NEGATIVE mg/dL
HGB URINE DIPSTICK: NEGATIVE
Ketones, ur: NEGATIVE mg/dL
Leukocytes, UA: NEGATIVE
Nitrite: NEGATIVE
PH: 5 (ref 5.0–8.0)
Protein, ur: NEGATIVE mg/dL
Specific Gravity, Urine: 1.006 (ref 1.005–1.030)

## 2018-07-25 LAB — RAPID URINE DRUG SCREEN, HOSP PERFORMED
Amphetamines: NOT DETECTED
Barbiturates: NOT DETECTED
Benzodiazepines: NOT DETECTED
Cocaine: NOT DETECTED
Opiates: POSITIVE — AB
TETRAHYDROCANNABINOL: NOT DETECTED

## 2018-07-25 LAB — COMPREHENSIVE METABOLIC PANEL
ALT: 15 U/L (ref 0–44)
AST: 23 U/L (ref 15–41)
Albumin: 3.2 g/dL — ABNORMAL LOW (ref 3.5–5.0)
Alkaline Phosphatase: 70 U/L (ref 38–126)
Anion gap: 12 (ref 5–15)
BUN: 9 mg/dL (ref 8–23)
CALCIUM: 9 mg/dL (ref 8.9–10.3)
CO2: 24 mmol/L (ref 22–32)
Chloride: 107 mmol/L (ref 98–111)
Creatinine, Ser: 0.93 mg/dL (ref 0.61–1.24)
GFR calc Af Amer: >60 mL/min (ref >60)
GFR calc non Af Amer: >60 mL/min (ref >60)
Glucose, Bld: 137 mg/dL — ABNORMAL HIGH (ref 70–99)
Potassium: 3 mmol/L — ABNORMAL LOW (ref 3.5–5.1)
Sodium: 143 mmol/L (ref 135–145)
Total Bilirubin: 0.4 mg/dL (ref 0.3–1.2)
Total Protein: 7.3 g/dL (ref 6.5–8.1)

## 2018-07-25 LAB — COOXEMETRY PANEL
Carboxyhemoglobin: 3.3 % — ABNORMAL HIGH (ref 0.5–1.5)
Methemoglobin: 0.9 % (ref 0.0–1.5)
O2 Saturation: 94.4 %
Total hemoglobin: 11.9 g/dL — ABNORMAL LOW (ref 12.0–16.0)

## 2018-07-25 LAB — PROTIME-INR
INR: 1.16
Prothrombin Time: 14.7 seconds (ref 11.4–15.2)

## 2018-07-25 LAB — AMMONIA: Ammonia: 34 umol/L (ref 9–35)

## 2018-07-25 LAB — CBG MONITORING, ED: Glucose-Capillary: 124 mg/dL — ABNORMAL HIGH (ref 70–99)

## 2018-07-25 LAB — BRAIN NATRIURETIC PEPTIDE: B Natriuretic Peptide: 203.4 pg/mL — ABNORMAL HIGH (ref 0.0–100.0)

## 2018-07-25 MED ORDER — FUROSEMIDE 10 MG/ML IJ SOLN: 40.0000 mg | Freq: Once | INTRAMUSCULAR | Status: DC

## 2018-07-25 NOTE — ED Notes (Signed)
IV nurse at bedside.

## 2018-07-25 NOTE — ED Notes (Signed)
Pt refuses to leave monitoring equipment on.  RN decreased stimuli in the room, turned lights off, gave warm blankets.  Pt agreed to leave monitoring equipment on.

## 2018-07-25 NOTE — ED Notes (Signed)
Pt refuses to wear oxygen or remain on monitor, pt pulling oxygen and BP cuff off.

## 2018-07-25 NOTE — ED Notes (Signed)
Pt dressed and walking to bathroom w/o assistance.

## 2018-07-25 NOTE — Discharge Instructions (Addendum)
Substance Abuse Treatment Programs ° °Intensive Outpatient Programs °[facility]     °[address]      °[place], [place]                   °[phone]      ° °[facility] °[address] °[place], [place] °[phone] ° °[facility]     °(Inpatient and outpatient)     °[address]           °[phone]   ° °[facility] °[phone] (Suboxone and Methadone) ° °[address]      °[place], [place] [address]      °[phone]      ° °[address] °[place], [place] °[phone] ° °[facility] (Outpatient/Inpatient, Chemical)    °(insurance only) [phone]      °       °[facility] (Groups & Residential) °[place], [place] °[phone] ° °   °[facility]     °[address]     °[place], [place]      °[phone]      ° °[facility] (for caregivers and family) °[address] °[place], [place] °336-299-4655 ° ° ° ° ° °Residential Treatment Programs °[facility]      °[address], [place], [place] [address]  °[phone]      ° °[facility] °[address] [place], [place] [address] °[phone] ° °[facility]        °[phone]      ° °[facility] °[phone] ° °[facility]             °[address]                                         °[place], [place]                                                °[phone] or [phone]                              ° °[facility] [place] °[address] °[place], [address] °[phone] ° °[facility]    °[address]      °[place], [place]     °[phone]      ° °[facility] °[address] °[place], [place] °[phone] ° °[facility]   °[address]     °[place], [place] [address]     °[phone]      °Admissions: 8am-3pm M-F ° °[facility] °[address] °[place],  [place] °[phone] ° °[facility]: Residential Program (90 Days)   °[place], [place]      °[phone] or [phone]    ° °[facility] °[place], [place] °(Walk in Hours over the weekend or by referral) ° °[facility] °[address], [place], [place] [address] °([phone] ° °Crisis Mobile: [facility]:  1-877-626-1772 (for crisis response 24 hours a day) °[facility] Hotline:      1-800-256-2452 °Outpatient Psychiatry and Counseling ° °[facility]: Mobile Crisis   Management 24 hours:  1-877-626-1772 ° °[facility] sliding scale fee and walk in schedule: M-F 8am-12pm/1pm-3pm °[address]  °[place], [place] [address] °[phone] ° °[facility] °[address] °[place], [place] [address] °[phone] ° °[facility] ([facility])- new patient walk-in appointments available Monday - Friday 8am -3pm.          °[address] °[place], [place] [address] °336-676-6840 or crisis line- 336-676-6905 ° °[facility]/ Intensive Outpatient Therapy Program °[address] °[place], [place] [address] °336-832-9804 ° °[facility]                  °Crisis Services      °[phone]      °[address]     °[place], [place] [address]                ° °[facility]   °[facility] °[phone] °[address] °[place], [place] [address] ° ° °[facility]          °[address],  °[place], [place] [address]       °[phone] ° °[facility] °[address] °[place], [place] [address] °336-292-1510 ° °[facility]    °[address]    °[place], [place] [address]     °[phone]      ° °Parish McKinney, [place]     °[address]     °[place] [address]     °[phone]     °  °[facility] °[address] °[place] [address] ° °[facility]     °[address]     °[place], [place]      °336-542-2076      ° °[facility] °Shamsher Ahluwalia, MD °[address] °[place], [place] [address] °[phone] ° °[facility]     °[address]     °[place], [place] [address]     °336-274-1237      ° °[facility] °[address] °[place], [place] [address] °336-854-4450 °Resources for Temporary Residential Assistance/Crisis Centers ° °DAY CENTERS °[facility] °M-F 8am-3pm   °[address] [place], [place] [address]   336-332-0824 °Services include: laundry, barbering, support groups, case management, phone  & computer access, showers, AA/NA mtgs, mental health/substance abuse nurse, job skills class, disability information, VA assistance, spiritual classes, etc.  ° °HOMELESS SHELTERS ° °[facility]     °[facility]   °[address], [place]     °[phone]       °       °[facility] (women and children)       °[address] °[place], [place] [address] °336-275-0820 °[email or web address] for application and process °Application Required ° °[facility]   °[address]    °[place] [address]     °[phone]       °             °[facility] °[address] °, [place] [address] °[phone] °336-235-0363(schedule application appt.) °Application Required ° °[facility] (women only)    °[address]     °[place], [place] [address]     °336-884-1039      °  Intake starts 6pm daily °Need valid ID, SSC, & Police report °[facility] [place] °[address] °[place], [place] °336-881-5420 °Application Required ° °[facility] (men only)     °[address]      °[place], [place]     °[phone]      ° °[facility] °(Pregnant women only) °[address] °[place], [place] °336-275-0206 ° °[facility]      °[address]      °[place], [place] [address]     °[phone]      °       °[facility] °[address] °[place], [place] °336-723-1848 °90 day commitment/SA/Application process ° °[facility](men only)     °[address]     °[place], [place]     °[phone]       °Check-in at 7pm     °       °[facility] °[address] °[place], [place] [address] °336-248-6684 °Men/Women/Women and Children must be there by 7 pm ° °[facility] °[place], [place] °336-722-8721                ° °

## 2018-07-26 ENCOUNTER — Encounter (HOSPITAL_COMMUNITY): Payer: Self-pay | Admitting: General Practice

## 2018-08-14 ENCOUNTER — Emergency Department (HOSPITAL_COMMUNITY): Payer: Medicare Other

## 2018-08-14 ENCOUNTER — Encounter (HOSPITAL_COMMUNITY): Payer: Self-pay | Admitting: Emergency Medicine

## 2018-08-14 ENCOUNTER — Emergency Department (HOSPITAL_COMMUNITY)
Admission: EM | Admit: 2018-08-14 | Discharge: 2018-08-14 | Disposition: A | Discharge status: Home / Self Care | Payer: Medicare Other | Attending: Emergency Medicine | Admitting: Emergency Medicine

## 2018-08-14 DIAGNOSIS — R0781 Pleurodynia: Secondary | ICD-10-CM | POA: Insufficient documentation

## 2018-08-14 DIAGNOSIS — R0602 Shortness of breath: Secondary | ICD-10-CM | POA: Diagnosis present

## 2018-08-14 DIAGNOSIS — Z5321 Procedure and treatment not carried out due to patient leaving prior to being seen by health care provider: Secondary | ICD-10-CM | POA: Insufficient documentation

## 2018-08-14 NOTE — ED Notes (Signed)
Called x3 for room no answer

## 2018-08-14 NOTE — ED Triage Notes (Signed)
Patient here from home with complaints of fall yesterday, reports right sided rib pain. Pain 10/10, difficulty taking deep breath.

## 2018-08-14 NOTE — ED Notes (Signed)
RN made aware of elevated BP 

## 2018-09-08 ENCOUNTER — Emergency Department (HOSPITAL_COMMUNITY)
Admission: EM | Admit: 2018-09-08 | Discharge: 2018-09-08 | Disposition: A | Discharge status: Home / Self Care | Payer: Medicare Other | Attending: Emergency Medicine | Admitting: Emergency Medicine

## 2018-09-08 ENCOUNTER — Emergency Department (HOSPITAL_COMMUNITY): Payer: Medicare Other

## 2018-09-08 ENCOUNTER — Other Ambulatory Visit: Payer: Self-pay

## 2018-09-08 DIAGNOSIS — W109XXA Fall (on) (from) unspecified stairs and steps, initial encounter: Secondary | ICD-10-CM | POA: Insufficient documentation

## 2018-09-08 DIAGNOSIS — Z86718 Personal history of other venous thrombosis and embolism: Secondary | ICD-10-CM | POA: Insufficient documentation

## 2018-09-08 DIAGNOSIS — Z8546 Personal history of malignant neoplasm of prostate: Secondary | ICD-10-CM | POA: Insufficient documentation

## 2018-09-08 DIAGNOSIS — Z79899 Other long term (current) drug therapy: Secondary | ICD-10-CM | POA: Diagnosis not present

## 2018-09-08 DIAGNOSIS — I10 Essential (primary) hypertension: Secondary | ICD-10-CM | POA: Diagnosis not present

## 2018-09-08 DIAGNOSIS — Z7901 Long term (current) use of anticoagulants: Secondary | ICD-10-CM | POA: Insufficient documentation

## 2018-09-08 DIAGNOSIS — E119 Type 2 diabetes mellitus without complications: Secondary | ICD-10-CM | POA: Diagnosis not present

## 2018-09-08 DIAGNOSIS — I4891 Unspecified atrial fibrillation: Secondary | ICD-10-CM | POA: Insufficient documentation

## 2018-09-08 DIAGNOSIS — Y998 Other external cause status: Secondary | ICD-10-CM | POA: Insufficient documentation

## 2018-09-08 DIAGNOSIS — F17200 Nicotine dependence, unspecified, uncomplicated: Secondary | ICD-10-CM | POA: Insufficient documentation

## 2018-09-08 DIAGNOSIS — Y939 Activity, unspecified: Secondary | ICD-10-CM | POA: Insufficient documentation

## 2018-09-08 DIAGNOSIS — S299XXA Unspecified injury of thorax, initial encounter: Secondary | ICD-10-CM | POA: Diagnosis present

## 2018-09-08 DIAGNOSIS — E7800 Pure hypercholesterolemia, unspecified: Secondary | ICD-10-CM | POA: Insufficient documentation

## 2018-09-08 DIAGNOSIS — Y92009 Unspecified place in unspecified non-institutional (private) residence as the place of occurrence of the external cause: Secondary | ICD-10-CM | POA: Insufficient documentation

## 2018-09-08 DIAGNOSIS — Z7982 Long term (current) use of aspirin: Secondary | ICD-10-CM | POA: Insufficient documentation

## 2018-09-08 LAB — I-STAT TROPONIN, ED: TROPONIN I, POC: 0.05 ng/mL (ref 0.00–0.08)

## 2018-09-08 LAB — CBC WITH DIFFERENTIAL/PLATELET
Abs Immature Granulocytes: 0.01 10*3/uL (ref 0.00–0.07)
Basophils Absolute: 0 10*3/uL (ref 0.0–0.1)
Basophils Relative: 1 %
Eosinophils Absolute: 0.1 10*3/uL (ref 0.0–0.5)
Eosinophils Relative: 1 %
HCT: 41.4 % (ref 39.0–52.0)
Hemoglobin: 13.1 g/dL (ref 13.0–17.0)
Immature Granulocytes: 0 %
LYMPHS ABS: 1.8 10*3/uL (ref 0.7–4.0)
Lymphocytes Relative: 31 %
MCH: 32 pg (ref 26.0–34.0)
MCHC: 31.6 g/dL (ref 30.0–36.0)
MCV: 101.2 fL — ABNORMAL HIGH (ref 80.0–100.0)
Monocytes Absolute: 0.4 10*3/uL (ref 0.1–1.0)
Monocytes Relative: 7 %
Neutro Abs: 3.4 10*3/uL (ref 1.7–7.7)
Neutrophils Relative %: 60 %
Platelets: 164 10*3/uL (ref 150–400)
RBC: 4.09 MIL/uL — ABNORMAL LOW (ref 4.22–5.81)
RDW: 14 % (ref 11.5–15.5)
WBC: 5.6 10*3/uL (ref 4.0–10.5)
nRBC: 0 % (ref 0.0–0.2)

## 2018-09-08 LAB — COMPREHENSIVE METABOLIC PANEL
ALT: 36 U/L (ref 0–44)
AST: 66 U/L — ABNORMAL HIGH (ref 15–41)
Albumin: 4 g/dL (ref 3.5–5.0)
Alkaline Phosphatase: 98 U/L (ref 38–126)
Anion gap: 11 (ref 5–15)
BILIRUBIN TOTAL: 0.8 mg/dL (ref 0.3–1.2)
BUN: 18 mg/dL (ref 8–23)
CO2: 21 mmol/L — ABNORMAL LOW (ref 22–32)
Calcium: 9.2 mg/dL (ref 8.9–10.3)
Chloride: 107 mmol/L (ref 98–111)
Creatinine, Ser: 1.07 mg/dL (ref 0.61–1.24)
GFR calc Af Amer: >60 mL/min (ref >60)
GFR calc non Af Amer: >60 mL/min (ref >60)
Glucose, Bld: 115 mg/dL — ABNORMAL HIGH (ref 70–99)
Potassium: 4.2 mmol/L (ref 3.5–5.1)
Sodium: 139 mmol/L (ref 135–145)
Total Protein: 7.6 g/dL (ref 6.5–8.1)

## 2018-09-08 MED ORDER — DILTIAZEM LOAD VIA INFUSION
15.0000 mg | Freq: Once | INTRAVENOUS | Status: DC
  Filled 2018-09-08: qty 15

## 2018-09-08 MED ORDER — ONDANSETRON HCL 4 MG/2ML IJ SOLN
INTRAMUSCULAR | Status: AC
  Filled 2018-09-08: qty 2

## 2018-09-08 MED ORDER — IOHEXOL 300 MG/ML  SOLN
75.0000 mL | Freq: Once | INTRAMUSCULAR | Status: AC | PRN
  Administered 2018-09-08: 75 mL via INTRAVENOUS

## 2018-09-08 MED ORDER — MORPHINE SULFATE (PF) 2 MG/ML IV SOLN
INTRAVENOUS | Status: AC
  Administered 2018-09-08: 6 mg via INTRAVENOUS
  Filled 2018-09-08: qty 3

## 2018-09-08 MED ORDER — MORPHINE SULFATE (PF) 4 MG/ML IV SOLN: 6.0000 mg | Freq: Once | INTRAVENOUS | Status: DC

## 2018-09-08 MED ORDER — ONDANSETRON HCL 4 MG/2ML IJ SOLN
4.0000 mg | Freq: Once | INTRAMUSCULAR | Status: AC
  Administered 2018-09-08: 4 mg via INTRAVENOUS

## 2018-09-08 MED ORDER — LIDOCAINE 5 % EX PTCH
1.0000 | MEDICATED_PATCH | CUTANEOUS | Status: DC
  Administered 2018-09-08: 1 via TRANSDERMAL
  Filled 2018-09-08: qty 1

## 2018-09-08 MED ORDER — LIDOCAINE 5 % EX PTCH: 1.0000 | MEDICATED_PATCH | CUTANEOUS | 0 refills | Status: DC

## 2018-09-08 MED ORDER — DILTIAZEM HCL-DEXTROSE 100-5 MG/100ML-% IV SOLN (PREMIX)
5.0000 mg/h | INTRAVENOUS | Status: DC
  Filled 2018-09-08: qty 100

## 2018-09-08 MED ORDER — HYDROMORPHONE HCL 1 MG/ML IJ SOLN
1.0000 mg | Freq: Once | INTRAMUSCULAR | Status: AC
  Administered 2018-09-08: 1 mg via INTRAVENOUS
  Filled 2018-09-08: qty 1

## 2018-09-08 MED ORDER — HYDROCODONE-ACETAMINOPHEN 5-325 MG PO TABS: 2.0000 | ORAL_TABLET | ORAL | 0 refills | Status: DC | PRN

## 2018-09-08 NOTE — ED Notes (Signed)
Portable in room.  

## 2018-09-08 NOTE — ED Notes (Signed)
Pt verbalized understanding of d/c instructions and has no further questions, VSS, NAD. Pt in NSR upon d/c. Pt to follow up with pcp and a-fib clinic next week.

## 2018-09-08 NOTE — Discharge Instructions (Addendum)
Today there was no sign of any new rib fractures but you have a bad bruise from your fall yesterday.  You were given numbing patches and pain medication to use.  Make sure you are not drinking any alcohol if you are taking pain pills.  Secondly when you arrive today you are in a rhythm called atrial fibrillation.  Your heart went back into normal rhythm on its own but you were given a referral to the A. fib clinic and they should contact you for an appointment.  If you start having terrible shortness of breath, racing heart or any episodes of passing out you need to return to the emergency room immediately.

## 2018-09-08 NOTE — ED Notes (Signed)
Pt was able to use urinal in bed, tolerated well.

## 2018-09-08 NOTE — ED Notes (Signed)
Pt now in NSR with BBB, EKG captured and given to Dr Maryan Rued. Pt still in severe pain.

## 2018-09-08 NOTE — ED Provider Notes (Signed)
[facility] Provider Note   CSN: [id number] Arrival date & time: 09/08/18  1212     History   Chief Complaint Chief Complaint  Patient presents with  . Fall  . Tachycardia    HPI Todd [name]. is a 63 y.o. male.  Patient is a 63 year old male with a history of hypertension, polysubstance use, recent diagnosis of DVT and PE in November and diabetes who presents today with severe pain and shortness of breath in his left chest.  Patient states he was walking up the stairs to his home yesterday and because of prior knee surgeries his right knee gave out causing him to fall onto the left side of his chest on a concrete step.  He states since that time he has had severe pain in his left chest that is only worsened.  He was unable to get comfortable last night and anytime he breathes it is excruciating.  He did not hit his head or lose consciousness.  He denies any pain in his arms or legs.  He has no abdominal pain, nausea or vomiting.  He was noted also to have a rapid heart rate upon evaluation here.  He has no prior history of abnormal heart rates and is currently taking Eliquis for his blood clot.  He last saw his PCP Dr. Delfina Redwood 3 weeks ago and was told that his blood pressure was elevated but never said anything about elevated heart rate.  The history is provided by the patient.  Fall  This is a new problem. The current episode started yesterday. The problem occurs constantly. The problem has been gradually worsening. Associated symptoms include chest pain and shortness of breath. The symptoms are aggravated by bending, twisting and coughing. Nothing relieves the symptoms. He has tried nothing for the symptoms. The treatment provided no relief.    Past Medical History:  Diagnosis Date  . Arthritis    "fingers" (07/14/2018)  . Depression   . DVT (deep venous thrombosis) ([facility]) LLE  . Hepatitis C     finished harvoni tx ~ 2017  .  Hypercholesterolemia   . Hypertension   . Prostate cancer ([place]) 10yrs ago  . Pulmonary embolism and infarction ([place]) 07/13/2018  . Sleep apnea    not currently using cpap, mask causing vertigo  . Type II diabetes mellitus [facility]     Patient Active Problem List   Diagnosis Date Noted  . Pulmonary embolus and infarction ([place]) 07/13/2018  . Opioid use disorder, moderate, dependence ([place]) 06/29/2018  . Alcohol use disorder, severe, dependence ([place]) 06/27/2018  . MDD (major depressive disorder), recurrent severe, without psychosis ([place]) 06/26/2018  . Varicose veins of left lower extremity with complications 24/40/1027  . Benign paroxysmal positional vertigo 11/08/2013  . RBBB 06/02/2009  . 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 DM (diabetes mellitus), type 2 ([place]) 04/11/2009  . OBESITY 04/11/2009  . DEPRESSION 04/11/2009  . Essential hypertension 04/11/2009  . DIVERTICULAR DISEASE 04/11/2009  . CHOLECYSTITIS 04/11/2009  . CHEST PAIN 04/11/2009  . ABDOMINAL PAIN 04/11/2009    Past Surgical History:  Procedure Laterality Date  . COLONOSCOPY WITH PROPOFOL N/A 02/19/2014   Procedure: COLONOSCOPY WITH PROPOFOL;  Surgeon: Garlan Fair, MD;  Location: [facility];  Service: Endoscopy;  Laterality: N/A;  . ENDOVENOUS ABLATION SAPHENOUS VEIN W/ LASER Left 11/22/2017   endovenous laser ablation L SSV by Tinnie Gens MD   . PROSTATECTOMY  2008  . REPAIR QUADRICEPS / HAMSTRING MUSCLE Right  Home Medications    Prior to Admission medications   Medication Sig Start Date End Date Taking? Authorizing Provider  aspirin EC 81 MG tablet Take 81 mg by mouth daily.    [provider]  atorvastatin (LIPITOR) 10 MG tablet  11/19/17   [provider]  DULoxetine (CYMBALTA) 60 MG capsule Take 1 capsule (60 mg total) by mouth daily. 07/05/18   Pennelope Bracken, MD  ELIQUIS STARTER PACK (ELIQUIS STARTER PACK) 5 MG TABS Take as directed on package: start with two-5mg   tablets twice daily for 7 days. On day 8, switch to one-5mg  tablet twice daily. 07/14/18   Patrecia Pour, MD  hydrOXYzine (ATARAX/VISTARIL) 50 MG tablet Take 1 tablet (50 mg total) by mouth every 6 (six) hours as needed for anxiety. 07/04/18   Pennelope Bracken, MD  losartan (COZAAR) 50 MG tablet Take 50 mg by mouth daily. 06/15/18   [provider]  metFORMIN (GLUCOPHAGE) 1000 MG tablet Take 1,000 mg by mouth 2 (two) times daily with a meal.    [provider]  Multiple Vitamin (MULTIVITAMIN WITH MINERALS) TABS tablet Take 1 tablet by mouth daily.    [provider]  traMADol (ULTRAM) 50 MG tablet Take 2 tablets (100 mg total) by mouth every 6 (six) hours as needed for moderate pain. 07/14/18   Patrecia Pour, MD  traZODone (DESYREL) 100 MG tablet Take 1 tablet (100 mg total) by mouth at bedtime. 07/04/18   Pennelope Bracken, MD    Family History Family History  Problem Relation Age of Onset  . Cancer Father        PROSTATE    Social History Social History   Tobacco Use  . Smoking status: Current Every Day Smoker    Packs/day: 0.12    Years: 45.00    Pack years: 5.40  . Smokeless tobacco: Never Used  Substance Use Topics  . Alcohol use: Yes    Comment: 07/14/2018 "40oz beer/day"  . 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   Review of Systems Review of Systems  Respiratory: Positive for shortness of breath.   Cardiovascular: Positive for chest pain.  All other systems reviewed and are negative.    Physical Exam Updated Vital Signs BP (!) 120/94   Pulse (!) 175   Temp 98.2 F (36.8 C) (Oral)   Resp 18   Ht 5\' 9"  (1.753 m)   Wt 105.2 kg   SpO2 93%   BMI 34.26 kg/m   Physical Exam Vitals signs and nursing note reviewed.  Constitutional:      General: He is in acute distress.     Appearance: He is well-developed.  HENT:     Head: Normocephalic and atraumatic.      Nose: Nose normal.  Eyes:     Conjunctiva/sclera: Conjunctivae normal.     Pupils: Pupils are equal, round, and reactive to light.  Neck:     Musculoskeletal: Normal range of motion and neck supple.  Cardiovascular:     Rate and Rhythm: Tachycardia present. Rhythm irregular.     Heart sounds: No murmur.  Pulmonary:     Effort: Pulmonary effort is normal. Tachypnea present. No respiratory distress.     Breath sounds: Normal breath sounds. No decreased breath sounds, wheezing or rales.  Chest:     Chest wall: Tenderness present. No lacerations, deformity or crepitus.  Abdominal:     General: There is no distension.     Palpations: Abdomen is soft.     Tenderness: There is no abdominal tenderness. There is no guarding or rebound.  Musculoskeletal: Normal range of motion.        General: No tenderness.     Right lower leg: No edema.     Left lower leg: No edema.  Skin:    General: Skin is warm and dry.     Findings: No erythema or rash.  Neurological:     Mental Status: He is alert and oriented to person, place, and time.  Psychiatric:        Behavior: Behavior normal.      ED Treatments / Results  Labs (all labs ordered are listed, but only abnormal results are displayed) Labs Reviewed  CBC WITH DIFFERENTIAL/PLATELET - Abnormal; Notable for the following components:      Result Value   RBC 4.09 (*)    MCV 101.2 (*)    All other components within normal limits  COMPREHENSIVE METABOLIC PANEL - Abnormal; Notable for the following components:   CO2 21 (*)    Glucose, Bld 115 (*)    AST 66 (*)    All other components within normal limits  I-STAT TROPONIN, ED    EKG EKG Interpretation  Date/Time:  Friday September 08 2018 12:23:15 EST Ventricular Rate:  167 PR Interval:    QRS Duration: 126 QT Interval:  309 QTC Calculation: 516 R Axis:   -98 Text Interpretation:  new Wide-QRS tachycardia RBBB and LAFB Confirmed by Blanchie Dessert ([id number]) on 09/08/2018 12:33:18  PM   Radiology Dg Chest Port 1 View  Result Date: 09/08/2018 CLINICAL DATA:  Fall.  Chest pain. EXAM: PORTABLE CHEST 1 VIEW COMPARISON:  08/14/2018. FINDINGS: Mediastinum hilar structures normal. Stable cardiomegaly. No pulmonary venous congestion. Low lung volumes. Mild basilar atelectasis. No prominent pleural effusion. No pneumothorax. Corticated bony density noted adjacent to the left T1 transverse process, this is may represent an old fracture fragment. Left rib fractures again noted. Acute left seventh rib fracture best identified on prior left rib series. No new abnormality identified. IMPRESSION: 1. Old left rib fractures. Acute left seventh rib fracture best noted on prior rib series of 08/14/2018. No new bony abnormalities noted on today's exam. No pneumothorax. 2. Stable cardiomegaly. 3. Low lung volumes with basilar atelectasis. Electronically Signed   By: Marcello Moores  Register   On: 09/08/2018 12:57    Procedures Procedures (including critical care time)  Medications Ordered in ED Medications  morphine 4 MG/ML injection 6 mg (6 mg Intravenous Not Given 09/08/18 1240)  ondansetron (ZOFRAN) injection 4 mg (4 mg Intravenous Given 09/08/18 1240)  morphine 2 MG/ML injection (6 mg Intravenous Given 09/08/18 1240)     Initial Impression / Assessment and Plan / ED Course  I have reviewed the triage vital signs and the nursing notes.  Pertinent labs & imaging results that were available during my care of the patient were reviewed by me and considered in my medical decision making (see chart for details).     Patient presenting today with severe left-sided chest pain and shortness of breath in the setting of recent fall and injury yesterday afternoon.  Patient has some bruising over the left chest wall but bilateral breath sounds with low suspicion for pneumothorax at this time.  Patient appears very uncomfortable but is also noted to have significant tachycardia in the 170s.  Patient has no  prior history  of dysrhythmia.  He was recently diagnosed with PE and DVT and is currently on Eliquis.  He is prior EKG shows a right bundle branch block but today appears to most likely be atrial fibrillation with RVR.  Patient given pain control.  Labs and chest x-ray pending.  If pain control does not improve patient's heart rate will start with beta-blocker.  He does not take rate controlling medication at this time.  1:48 PM X-ray showing a rib fracture from the end of December and old rib fractures.  No pneumothorax seen.  Patient continues to be tachycardic in the 160s.  Appears to be atrial fibrillation and will give Cardizem.  CBC without acute findings, CMP without acute findings.  We will do a chest CT to ensure no further trauma.  2:02 PM Patient converted spontaneously prior to administration of medications.  3:24 PM CT negative for acute fracture.  Patient still having pain but related to movement and palpation.  He has no abdominal pain and low suspicion for intra-abdominal injury.  Patient continues to be in a sinus rhythm.  Will have patient follow-up with A. fib clinic.  Patient given pain control and lidocaine patches.  He does have a friend who can stay with him to make sure he is okay.  CHA2DS2/VAS Stroke Risk Points      N/A >= 2 Points: High Risk  1 - 1.99 Points: Medium Risk  0 Points: Low Risk    A final score could not be computed because of missing components.:   Last Change: N/A     This score determines the patient's risk of having a stroke if the  patient has atrial fibrillation.      This score is not applicable to this patient. Components are not  calculated.      Final Clinical Impressions(s) / ED Diagnoses   Final diagnoses:  Atrial fibrillation with rapid ventricular response ([place])  Injury of chest wall, initial encounter    ED Discharge Orders         Ordered    HYDROcodone-acetaminophen (NORCO/VICODIN) 5-325 MG tablet  Every 4 hours PRN      09/08/18 1526    lidocaine (LIDODERM) 5 %  Every 24 hours     09/08/18 1526    Amb referral to AFIB Clinic     09/08/18 1528           Blanchie Dessert, MD 09/08/18 1528

## 2018-09-08 NOTE — ED Triage Notes (Signed)
Pt endorses having a mechanical fall due to his right knee(hx of surgery) yesterday and fell into a concrete step onto his abd. Pt now having severe mid abd pain and found to be tachycardic in the 170s in triage. Axox4. On eliquis.

## 2018-09-13 ENCOUNTER — Ambulatory Visit (HOSPITAL_COMMUNITY): Payer: Self-pay | Admitting: Nurse Practitioner

## 2019-01-09 ENCOUNTER — Encounter (HOSPITAL_COMMUNITY): Payer: Self-pay

## 2019-01-09 ENCOUNTER — Inpatient Hospital Stay (HOSPITAL_COMMUNITY)
Admission: EM | Admit: 2019-01-09 | Discharge: 2019-01-18 | DRG: 682 | Discharge status: Against Medical Advice | Payer: Medicare Other | Attending: Internal Medicine | Admitting: Internal Medicine

## 2019-01-09 ENCOUNTER — Inpatient Hospital Stay (HOSPITAL_COMMUNITY): Payer: Medicare Other

## 2019-01-09 ENCOUNTER — Emergency Department (HOSPITAL_COMMUNITY): Payer: Medicare Other

## 2019-01-09 ENCOUNTER — Other Ambulatory Visit: Payer: Self-pay

## 2019-01-09 DIAGNOSIS — E860 Dehydration: Secondary | ICD-10-CM | POA: Diagnosis present

## 2019-01-09 DIAGNOSIS — K2210 Ulcer of esophagus without bleeding: Secondary | ICD-10-CM | POA: Diagnosis present

## 2019-01-09 DIAGNOSIS — N179 Acute kidney failure, unspecified: Secondary | ICD-10-CM | POA: Diagnosis present

## 2019-01-09 DIAGNOSIS — J189 Pneumonia, unspecified organism: Secondary | ICD-10-CM

## 2019-01-09 DIAGNOSIS — Z5329 Procedure and treatment not carried out because of patient's decision for other reasons: Secondary | ICD-10-CM | POA: Diagnosis present

## 2019-01-09 DIAGNOSIS — I471 Supraventricular tachycardia: Secondary | ICD-10-CM | POA: Diagnosis present

## 2019-01-09 DIAGNOSIS — E876 Hypokalemia: Secondary | ICD-10-CM | POA: Diagnosis present

## 2019-01-09 DIAGNOSIS — Z8619 Personal history of other infectious and parasitic diseases: Secondary | ICD-10-CM | POA: Diagnosis not present

## 2019-01-09 DIAGNOSIS — Z86711 Personal history of pulmonary embolism: Secondary | ICD-10-CM | POA: Diagnosis not present

## 2019-01-09 DIAGNOSIS — I493 Ventricular premature depolarization: Secondary | ICD-10-CM | POA: Diagnosis present

## 2019-01-09 DIAGNOSIS — I10 Essential (primary) hypertension: Secondary | ICD-10-CM | POA: Diagnosis present

## 2019-01-09 DIAGNOSIS — R079 Chest pain, unspecified: Secondary | ICD-10-CM | POA: Diagnosis not present

## 2019-01-09 DIAGNOSIS — F1120 Opioid dependence, uncomplicated: Secondary | ICD-10-CM | POA: Diagnosis present

## 2019-01-09 DIAGNOSIS — F329 Major depressive disorder, single episode, unspecified: Secondary | ICD-10-CM | POA: Diagnosis present

## 2019-01-09 DIAGNOSIS — Z8042 Family history of malignant neoplasm of prostate: Secondary | ICD-10-CM

## 2019-01-09 DIAGNOSIS — Z6833 Body mass index (BMI) 33.0-33.9, adult: Secondary | ICD-10-CM

## 2019-01-09 DIAGNOSIS — I452 Bifascicular block: Secondary | ICD-10-CM | POA: Diagnosis present

## 2019-01-09 DIAGNOSIS — Z7984 Long term (current) use of oral hypoglycemic drugs: Secondary | ICD-10-CM

## 2019-01-09 DIAGNOSIS — Z86718 Personal history of other venous thrombosis and embolism: Secondary | ICD-10-CM | POA: Diagnosis not present

## 2019-01-09 DIAGNOSIS — F1020 Alcohol dependence, uncomplicated: Secondary | ICD-10-CM | POA: Diagnosis present

## 2019-01-09 DIAGNOSIS — K449 Diaphragmatic hernia without obstruction or gangrene: Secondary | ICD-10-CM | POA: Diagnosis present

## 2019-01-09 DIAGNOSIS — F17210 Nicotine dependence, cigarettes, uncomplicated: Secondary | ICD-10-CM | POA: Diagnosis present

## 2019-01-09 DIAGNOSIS — Y900 Blood alcohol level of less than 20 mg/100 ml: Secondary | ICD-10-CM | POA: Diagnosis present

## 2019-01-09 DIAGNOSIS — M19049 Primary osteoarthritis, unspecified hand: Secondary | ICD-10-CM | POA: Diagnosis present

## 2019-01-09 DIAGNOSIS — Z20828 Contact with and (suspected) exposure to other viral communicable diseases: Secondary | ICD-10-CM | POA: Diagnosis present

## 2019-01-09 DIAGNOSIS — K219 Gastro-esophageal reflux disease without esophagitis: Secondary | ICD-10-CM | POA: Diagnosis not present

## 2019-01-09 DIAGNOSIS — Z7141 Alcohol abuse counseling and surveillance of alcoholic: Secondary | ICD-10-CM

## 2019-01-09 DIAGNOSIS — J159 Unspecified bacterial pneumonia: Secondary | ICD-10-CM | POA: Diagnosis present

## 2019-01-09 DIAGNOSIS — R1010 Upper abdominal pain, unspecified: Secondary | ICD-10-CM

## 2019-01-09 DIAGNOSIS — K210 Gastro-esophageal reflux disease with esophagitis: Secondary | ICD-10-CM | POA: Diagnosis present

## 2019-01-09 DIAGNOSIS — F419 Anxiety disorder, unspecified: Secondary | ICD-10-CM | POA: Diagnosis present

## 2019-01-09 DIAGNOSIS — K224 Dyskinesia of esophagus: Secondary | ICD-10-CM

## 2019-01-09 DIAGNOSIS — G4730 Sleep apnea, unspecified: Secondary | ICD-10-CM | POA: Diagnosis present

## 2019-01-09 DIAGNOSIS — K2970 Gastritis, unspecified, without bleeding: Secondary | ICD-10-CM | POA: Diagnosis present

## 2019-01-09 DIAGNOSIS — E119 Type 2 diabetes mellitus without complications: Secondary | ICD-10-CM

## 2019-01-09 DIAGNOSIS — E7800 Pure hypercholesterolemia, unspecified: Secondary | ICD-10-CM | POA: Diagnosis present

## 2019-01-09 DIAGNOSIS — F1010 Alcohol abuse, uncomplicated: Secondary | ICD-10-CM | POA: Diagnosis present

## 2019-01-09 DIAGNOSIS — N400 Enlarged prostate without lower urinary tract symptoms: Secondary | ICD-10-CM | POA: Diagnosis present

## 2019-01-09 DIAGNOSIS — Z8546 Personal history of malignant neoplasm of prostate: Secondary | ICD-10-CM

## 2019-01-09 DIAGNOSIS — K209 Esophagitis, unspecified: Secondary | ICD-10-CM | POA: Diagnosis not present

## 2019-01-09 DIAGNOSIS — R1310 Dysphagia, unspecified: Secondary | ICD-10-CM | POA: Diagnosis present

## 2019-01-09 DIAGNOSIS — E669 Obesity, unspecified: Secondary | ICD-10-CM | POA: Diagnosis present

## 2019-01-09 DIAGNOSIS — Z9079 Acquired absence of other genital organ(s): Secondary | ICD-10-CM

## 2019-01-09 DIAGNOSIS — Z79899 Other long term (current) drug therapy: Secondary | ICD-10-CM

## 2019-01-09 LAB — COMPREHENSIVE METABOLIC PANEL
ALT: 45 U/L — ABNORMAL HIGH (ref 0–44)
AST: 26 U/L (ref 15–41)
Albumin: 3.3 g/dL — ABNORMAL LOW (ref 3.5–5.0)
Alkaline Phosphatase: 85 U/L (ref 38–126)
Anion gap: 16 — ABNORMAL HIGH (ref 5–15)
BUN: 75 mg/dL — ABNORMAL HIGH (ref 8–23)
CO2: 17 mmol/L — ABNORMAL LOW (ref 22–32)
Calcium: 9.4 mg/dL (ref 8.9–10.3)
Chloride: 107 mmol/L (ref 98–111)
Creatinine, Ser: 4.11 mg/dL — ABNORMAL HIGH (ref 0.61–1.24)
GFR calc Af Amer: 17 mL/min — ABNORMAL LOW (ref >60)
GFR calc non Af Amer: 15 mL/min — ABNORMAL LOW (ref >60)
Glucose, Bld: 127 mg/dL — ABNORMAL HIGH (ref 70–99)
Potassium: 4 mmol/L (ref 3.5–5.1)
Sodium: 140 mmol/L (ref 135–145)
Total Bilirubin: 0.5 mg/dL (ref 0.3–1.2)
Total Protein: 7.8 g/dL (ref 6.5–8.1)

## 2019-01-09 LAB — URINALYSIS, ROUTINE W REFLEX MICROSCOPIC
Bilirubin Urine: NEGATIVE
Glucose, UA: NEGATIVE mg/dL
Ketones, ur: NEGATIVE mg/dL
Leukocytes,Ua: NEGATIVE
Nitrite: NEGATIVE
Protein, ur: NEGATIVE mg/dL
Specific Gravity, Urine: 1.012 (ref 1.005–1.030)
pH: 5 (ref 5.0–8.0)

## 2019-01-09 LAB — CBC WITH DIFFERENTIAL/PLATELET
Abs Immature Granulocytes: 0.07 10*3/uL (ref 0.00–0.07)
Basophils Absolute: 0 10*3/uL (ref 0.0–0.1)
Basophils Relative: 0 %
Eosinophils Absolute: 0.1 10*3/uL (ref 0.0–0.5)
Eosinophils Relative: 2 %
HCT: 41 % (ref 39.0–52.0)
Hemoglobin: 13.7 g/dL (ref 13.0–17.0)
Immature Granulocytes: 1 %
Lymphocytes Relative: 27 %
Lymphs Abs: 1.4 10*3/uL (ref 0.7–4.0)
MCH: 32 pg (ref 26.0–34.0)
MCHC: 33.4 g/dL (ref 30.0–36.0)
MCV: 95.8 fL (ref 80.0–100.0)
Monocytes Absolute: 0.7 10*3/uL (ref 0.1–1.0)
Monocytes Relative: 13 %
Neutro Abs: 3 10*3/uL (ref 1.7–7.7)
Neutrophils Relative %: 57 %
Platelets: 265 10*3/uL (ref 150–400)
RBC: 4.28 MIL/uL (ref 4.22–5.81)
RDW: 14.6 % (ref 11.5–15.5)
WBC: 5.3 10*3/uL (ref 4.0–10.5)
nRBC: 0 % (ref 0.0–0.2)

## 2019-01-09 LAB — RAPID URINE DRUG SCREEN, HOSP PERFORMED
Amphetamines: NOT DETECTED
Barbiturates: NOT DETECTED
Benzodiazepines: NOT DETECTED
Cocaine: NOT DETECTED
Opiates: POSITIVE — AB
Tetrahydrocannabinol: NOT DETECTED

## 2019-01-09 LAB — SARS CORONAVIRUS 2 BY RT PCR (HOSPITAL ORDER, PERFORMED IN ~~LOC~~ HOSPITAL LAB): SARS Coronavirus 2: NEGATIVE

## 2019-01-09 LAB — GLUCOSE, CAPILLARY: Glucose-Capillary: 111 mg/dL — ABNORMAL HIGH (ref 70–99)

## 2019-01-09 LAB — CREATININE, URINE, RANDOM: Creatinine, Urine: 134.66 mg/dL

## 2019-01-09 LAB — TROPONIN I: Troponin I: 0.03 ng/mL (ref <0.03)

## 2019-01-09 LAB — LIPASE, BLOOD: Lipase: 49 U/L (ref 11–51)

## 2019-01-09 LAB — ETHANOL: Alcohol, Ethyl (B): <10 mg/dL (ref <10)

## 2019-01-09 LAB — SODIUM, URINE, RANDOM: Sodium, Ur: 13 mmol/L

## 2019-01-09 MED ORDER — SODIUM CHLORIDE 0.9 % IV BOLUS
1000.0000 mL | Freq: Once | INTRAVENOUS | Status: AC
  Administered 2019-01-09: 21:00:00 1000 mL via INTRAVENOUS

## 2019-01-09 MED ORDER — FOLIC ACID 1 MG PO TABS
1.0000 mg | ORAL_TABLET | Freq: Every day | ORAL | Status: DC
  Administered 2019-01-10 – 2019-01-18 (×10): 1 mg via ORAL
  Filled 2019-01-09 (×10): qty 1

## 2019-01-09 MED ORDER — HEPARIN (PORCINE) 25000 UT/250ML-% IV SOLN
1700.0000 [IU]/h | INTRAVENOUS | Status: DC
  Administered 2019-01-10: 1300 [IU]/h via INTRAVENOUS
  Administered 2019-01-10: 13:00:00 1550 [IU]/h via INTRAVENOUS
  Administered 2019-01-11: 09:00:00 1700 [IU]/h via INTRAVENOUS
  Filled 2019-01-09 (×3): qty 250

## 2019-01-09 MED ORDER — ONDANSETRON HCL 4 MG/2ML IJ SOLN: 4.0000 mg | Freq: Four times a day (QID) | INTRAMUSCULAR | Status: DC | PRN

## 2019-01-09 MED ORDER — LORAZEPAM 2 MG/ML IJ SOLN
1.0000 mg | Freq: Four times a day (QID) | INTRAMUSCULAR | Status: AC | PRN
  Administered 2019-01-09 – 2019-01-10 (×2): 1 mg via INTRAVENOUS
  Filled 2019-01-09 (×2): qty 1

## 2019-01-09 MED ORDER — ALUM & MAG HYDROXIDE-SIMETH 200-200-20 MG/5ML PO SUSP
30.0000 mL | Freq: Once | ORAL | Status: AC
  Administered 2019-01-10: 30 mL via ORAL
  Filled 2019-01-09: qty 30

## 2019-01-09 MED ORDER — HYDROMORPHONE HCL 1 MG/ML IJ SOLN
1.0000 mg | Freq: Once | INTRAMUSCULAR | Status: AC
  Administered 2019-01-09: 21:00:00 1 mg via INTRAVENOUS
  Filled 2019-01-09: qty 1

## 2019-01-09 MED ORDER — THIAMINE HCL 100 MG/ML IJ SOLN
100.0000 mg | Freq: Every day | INTRAMUSCULAR | Status: DC
  Filled 2019-01-09 (×3): qty 2

## 2019-01-09 MED ORDER — FAMOTIDINE IN NACL 20-0.9 MG/50ML-% IV SOLN
20.0000 mg | Freq: Once | INTRAVENOUS | Status: AC
  Administered 2019-01-09: 19:00:00 20 mg via INTRAVENOUS
  Filled 2019-01-09: qty 50

## 2019-01-09 MED ORDER — LORAZEPAM 1 MG PO TABS: 1.0000 mg | ORAL_TABLET | Freq: Four times a day (QID) | ORAL | Status: AC | PRN

## 2019-01-09 MED ORDER — PANTOPRAZOLE SODIUM 40 MG IV SOLR
40.0000 mg | Freq: Once | INTRAVENOUS | Status: AC
  Administered 2019-01-10: 40 mg via INTRAVENOUS
  Filled 2019-01-09: qty 40

## 2019-01-09 MED ORDER — VITAMIN B-1 100 MG PO TABS
100.0000 mg | ORAL_TABLET | Freq: Every day | ORAL | Status: DC
  Administered 2019-01-10 – 2019-01-18 (×10): 100 mg via ORAL
  Filled 2019-01-09 (×11): qty 1

## 2019-01-09 MED ORDER — MORPHINE SULFATE (PF) 4 MG/ML IV SOLN
4.0000 mg | Freq: Once | INTRAVENOUS | Status: AC
  Administered 2019-01-09: 19:00:00 4 mg via INTRAVENOUS
  Filled 2019-01-09: qty 1

## 2019-01-09 MED ORDER — ATORVASTATIN CALCIUM 10 MG PO TABS
10.0000 mg | ORAL_TABLET | Freq: Every day | ORAL | Status: DC
  Administered 2019-01-10 – 2019-01-17 (×9): 10 mg via ORAL
  Filled 2019-01-09 (×9): qty 1

## 2019-01-09 MED ORDER — SERTRALINE HCL 100 MG PO TABS
100.0000 mg | ORAL_TABLET | Freq: Every morning | ORAL | Status: DC
  Administered 2019-01-10 – 2019-01-18 (×10): 100 mg via ORAL
  Filled 2019-01-09 (×10): qty 1

## 2019-01-09 MED ORDER — MORPHINE SULFATE (PF) 4 MG/ML IV SOLN
4.0000 mg | Freq: Once | INTRAVENOUS | Status: AC
  Administered 2019-01-09: 18:00:00 4 mg via INTRAVENOUS
  Filled 2019-01-09: qty 1

## 2019-01-09 MED ORDER — AZITHROMYCIN 500 MG PO TABS
250.0000 mg | ORAL_TABLET | Freq: Every day | ORAL | Status: AC
  Administered 2019-01-10 – 2019-01-13 (×4): 250 mg via ORAL
  Filled 2019-01-09 (×4): qty 1

## 2019-01-09 MED ORDER — ADULT MULTIVITAMIN W/MINERALS CH
1.0000 | ORAL_TABLET | Freq: Every day | ORAL | Status: DC
  Administered 2019-01-10 – 2019-01-18 (×10): 1 via ORAL
  Filled 2019-01-09 (×10): qty 1

## 2019-01-09 MED ORDER — MORPHINE SULFATE (PF) 2 MG/ML IV SOLN
2.0000 mg | INTRAVENOUS | Status: DC | PRN
  Administered 2019-01-09: 23:00:00 2 mg via INTRAVENOUS
  Administered 2019-01-10: 05:00:00 4 mg via INTRAVENOUS
  Administered 2019-01-10: 09:00:00 2 mg via INTRAVENOUS
  Administered 2019-01-10: 13:00:00 4 mg via INTRAVENOUS
  Filled 2019-01-09 (×2): qty 2
  Filled 2019-01-09: qty 1
  Filled 2019-01-09: qty 2
  Filled 2019-01-09: qty 1

## 2019-01-09 MED ORDER — TAMSULOSIN HCL 0.4 MG PO CAPS
0.4000 mg | ORAL_CAPSULE | Freq: Every evening | ORAL | Status: DC
  Administered 2019-01-10 – 2019-01-17 (×9): 0.4 mg via ORAL
  Filled 2019-01-09 (×9): qty 1

## 2019-01-09 MED ORDER — SODIUM CHLORIDE 0.9 % IV SOLN
INTRAVENOUS | Status: DC
  Administered 2019-01-09 – 2019-01-17 (×12): via INTRAVENOUS

## 2019-01-09 MED ORDER — HEPARIN BOLUS VIA INFUSION
4000.0000 [IU] | Freq: Once | INTRAVENOUS | Status: AC
  Administered 2019-01-10: 4000 [IU] via INTRAVENOUS
  Filled 2019-01-09: qty 4000

## 2019-01-09 MED ORDER — INSULIN ASPART 100 UNIT/ML ~~LOC~~ SOLN
0.0000 [IU] | SUBCUTANEOUS | Status: DC
  Administered 2019-01-10: 13:00:00 2 [IU] via SUBCUTANEOUS
  Administered 2019-01-10 – 2019-01-11 (×3): 1 [IU] via SUBCUTANEOUS
  Administered 2019-01-12 – 2019-01-14 (×3): 2 [IU] via SUBCUTANEOUS
  Administered 2019-01-14: 17:00:00 1 [IU] via SUBCUTANEOUS
  Administered 2019-01-15 – 2019-01-16 (×4): 2 [IU] via SUBCUTANEOUS
  Administered 2019-01-16 – 2019-01-17 (×2): 1 [IU] via SUBCUTANEOUS
  Administered 2019-01-17: 2 [IU] via SUBCUTANEOUS
  Administered 2019-01-17 – 2019-01-18 (×3): 1 [IU] via SUBCUTANEOUS

## 2019-01-09 MED ORDER — ACETAMINOPHEN 325 MG PO TABS
650.0000 mg | ORAL_TABLET | ORAL | Status: DC | PRN
  Administered 2019-01-11 – 2019-01-18 (×7): 650 mg via ORAL
  Filled 2019-01-09 (×7): qty 2

## 2019-01-09 MED ORDER — TRAZODONE HCL 50 MG PO TABS
200.0000 mg | ORAL_TABLET | Freq: Every day | ORAL | Status: DC
  Administered 2019-01-10 – 2019-01-17 (×9): 200 mg via ORAL
  Filled 2019-01-09 (×9): qty 4

## 2019-01-09 MED ORDER — AZITHROMYCIN 250 MG PO TABS
500.0000 mg | ORAL_TABLET | Freq: Every day | ORAL | Status: AC
  Administered 2019-01-10: 01:00:00 500 mg via ORAL
  Filled 2019-01-09: qty 2

## 2019-01-09 MED ORDER — HYDRALAZINE HCL 20 MG/ML IJ SOLN
5.0000 mg | INTRAMUSCULAR | Status: DC | PRN
  Administered 2019-01-10 – 2019-01-11 (×3): 5 mg via INTRAVENOUS
  Filled 2019-01-09 (×3): qty 1

## 2019-01-09 NOTE — ED Notes (Signed)
ED Provider at bedside. 

## 2019-01-09 NOTE — ED Provider Notes (Signed)
[facility] EMERGENCY DEPARTMENT Provider Note   CSN: [id number] Arrival date & time: 01/09/19  1748    History   Chief Complaint Chief Complaint  Patient presents with  . Chest Pain    HPI Cordarro Spinnato. is a 63 y.o. male.  He is complaining of central chest pain that is sharp and stabbing and radiates across his chest and upper abdomen into his back.  It is worse with coughing and deep breaths and moving.  He said it started about 3 days ago after he slept with the window open and he thought he had a cold in his chest.  There is been a little bit of a cough.  He says he has worsening pain when he swallows anything and he is has not eaten or drank any fluids in the last 4 days because of this pain.  He says he has a history of stomach problems.  No prior history of cardiac disease.  HPI: A 63 year old patient with a history of treated diabetes, hypertension and hypercholesterolemia presents for evaluation of chest pain. Initial onset of pain was more than 6 hours ago. The patient's chest pain is sharp and is not worse with exertion. The patient's chest pain is middle- or left-sided, is not well-localized, is not described as heaviness/pressure/tightness and does radiate to the arms/jaw/neck. The patient does not complain of nausea and denies diaphoresis. The patient has smoked in the past 90 days. The patient has no history of stroke, has no history of peripheral artery disease, has no relevant family history of coronary artery disease (first degree relative at less than age 71) and does not have an elevated BMI (>=30).   The history is provided by the patient.  Chest Pain  Pain location:  Substernal area Pain quality: sharp   Pain radiates to:  Mid back, L arm and R arm Pain severity:  Severe Onset quality:  Gradual Duration:  3 days Timing:  Constant Progression:  Unchanged Chronicity:  New Context: at rest   Relieved by:  None tried Worsened by:  Coughing,  movement and deep breathing (swallowing) Associated symptoms: abdominal pain, back pain, cough and dysphagia   Associated symptoms: no fever, no headache and no shortness of breath   Risk factors: diabetes mellitus, high cholesterol, hypertension, male sex, prior DVT/PE and smoking     Past Medical History:  Diagnosis Date  . Arthritis    "fingers" (07/14/2018)  . Depression   . DVT (deep venous thrombosis) (HCC) LLE  . Hepatitis C     finished harvoni tx ~ 2017  . Hypercholesterolemia   . Hypertension   . Prostate cancer ([place]) 9yrs ago  . Pulmonary embolism and infarction ([place]) 07/13/2018  . Sleep apnea    not currently using cpap, mask causing vertigo  . Type II diabetes mellitus [facility])     Patient Active Problem List   Diagnosis Date Noted  . Pulmonary embolus and infarction ([place]) 07/13/2018  . Opioid use disorder, moderate, dependence ([place]) 06/29/2018  . Alcohol use disorder, severe, dependence ([place]) 06/27/2018  . MDD (major depressive disorder), recurrent severe, without psychosis ([place]) 06/26/2018  . Varicose veins of left lower extremity with complications 95/63/8756  . Benign paroxysmal positional vertigo 11/08/2013  . RBBB 06/02/2009  . 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 DM (diabetes mellitus), type 2 ([place]) 04/11/2009  . OBESITY 04/11/2009  . DEPRESSION 04/11/2009  . Essential hypertension 04/11/2009  . DIVERTICULAR DISEASE 04/11/2009  . CHOLECYSTITIS 04/11/2009  .  CHEST PAIN 04/11/2009  . ABDOMINAL PAIN 04/11/2009    Past Surgical History:  Procedure Laterality Date  . COLONOSCOPY WITH PROPOFOL N/A 02/19/2014   Procedure: COLONOSCOPY WITH PROPOFOL;  Surgeon: Garlan Fair, MD;  Location: [facility] ENDOSCOPY;  Service: Endoscopy;  Laterality: N/A;  . ENDOVENOUS ABLATION SAPHENOUS VEIN W/ LASER Left 11/22/2017   endovenous laser ablation L SSV by Tinnie Gens MD   . PROSTATECTOMY  2008  . REPAIR QUADRICEPS / HAMSTRING MUSCLE Right         Home Medications     Prior to Admission medications   Medication Sig Start Date End Date Taking? Authorizing Provider  atorvastatin (LIPITOR) 10 MG tablet Take 10 mg by mouth daily at 6 PM.  11/19/17   [provider]  DULoxetine (CYMBALTA) 60 MG capsule Take 1 capsule (60 mg total) by mouth daily. Patient not taking: Reported on 09/08/2018 07/05/18   Pennelope Bracken, MD  ELIQUIS STARTER PACK (ELIQUIS STARTER PACK) 5 MG TABS Take as directed on package: start with two-5mg  tablets twice daily for 7 days. On day 8, switch to one-5mg  tablet twice daily. Patient taking differently: Take 5 mg by mouth 2 (two) times daily.  07/14/18   Patrecia Pour, MD  HYDROcodone-acetaminophen (NORCO/VICODIN) 5-325 MG tablet Take 2 tablets by mouth every 4 (four) hours as needed for severe pain. 09/08/18   Blanchie Dessert, MD  hydrOXYzine (ATARAX/VISTARIL) 50 MG tablet Take 1 tablet (50 mg total) by mouth every 6 (six) hours as needed for anxiety. Patient not taking: Reported on 09/08/2018 07/04/18   Pennelope Bracken, MD  lidocaine (LIDODERM) 5 % Place 1 patch onto the skin daily. Remove & Discard patch within 12 hours or as directed by MD 09/08/18   Blanchie Dessert, MD  losartan (COZAAR) 50 MG tablet Take 50 mg by mouth daily. 06/15/18   [provider]  metFORMIN (GLUCOPHAGE) 1000 MG tablet Take 1,000 mg by mouth 2 (two) times daily with a meal.    [provider]  Multiple Vitamin (MULTIVITAMIN WITH MINERALS) TABS tablet Take 1 tablet by mouth daily.    [provider]  traMADol (ULTRAM) 50 MG tablet Take 2 tablets (100 mg total) by mouth every 6 (six) hours as needed for moderate pain. 07/14/18   Patrecia Pour, MD  traZODone (DESYREL) 100 MG tablet Take 1 tablet (100 mg total) by mouth at bedtime. Patient not taking: Reported on 09/08/2018 07/04/18   Pennelope Bracken, MD    Family History Family History  Problem Relation Age of Onset  . Cancer Father        PROSTATE     Social History Social History   Tobacco Use  . Smoking status: Current Every Day Smoker    Packs/day: 0.12    Years: 45.00    Pack years: 5.40  . Smokeless tobacco: Never Used  Substance Use Topics  . Alcohol use: Yes    Comment: 07/14/2018 "40oz beer/day"  . 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   Review of Systems Review of Systems  Constitutional: Negative for fever.  HENT: Positive for trouble swallowing. Negative for sore throat.   Eyes: Negative for visual disturbance.  Respiratory: Positive for cough. Negative for shortness of breath.   Cardiovascular: Positive for chest pain.  Gastrointestinal: Positive for abdominal pain.  Genitourinary: Negative for dysuria.  Musculoskeletal: Positive for back pain.  Skin:  Negative for rash.  Neurological: Negative for headaches.     Physical Exam Updated Vital Signs BP (!) 150/91 (BP Location: Right Arm)   Temp 99.1 F (37.3 C) (Oral)   Resp (!) 25   Ht 5\' 9"  (1.753 m)   Wt 102.1 kg   SpO2 97%   BMI 33.23 kg/m   Physical Exam Vitals signs and nursing note reviewed.  Constitutional:      Appearance: He is well-developed. He is obese.  HENT:     Head: Normocephalic and atraumatic.  Eyes:     Conjunctiva/sclera: Conjunctivae normal.  Neck:     Musculoskeletal: Neck supple.  Cardiovascular:     Rate and Rhythm: Normal rate and regular rhythm.     Heart sounds: Normal heart sounds. No murmur.  Pulmonary:     Effort: Pulmonary effort is normal. No respiratory distress.     Breath sounds: Normal breath sounds.  Chest:     Chest wall: Tenderness present. No crepitus.  Abdominal:     Palpations: Abdomen is soft. There is no mass.     Tenderness: There is abdominal tenderness (epigastric). There is no guarding or rebound.  Musculoskeletal:     Right lower leg: He exhibits no tenderness.     Left lower leg: He exhibits no tenderness.  Skin:    General:  Skin is warm and dry.     Capillary Refill: Capillary refill takes less than 2 seconds.  Neurological:     General: No focal deficit present.     Mental Status: He is alert.      ED Treatments / Results  Labs (all labs ordered are listed, but only abnormal results are displayed) Labs Reviewed  COMPREHENSIVE METABOLIC PANEL - Abnormal; Notable for the following components:      Result Value   CO2 17 (*)    Glucose, Bld 127 (*)    BUN 75 (*)    Creatinine, Ser 4.11 (*)    Albumin 3.3 (*)    ALT 45 (*)    GFR calc non Af Amer 15 (*)    GFR calc Af Amer 17 (*)    Anion gap 16 (*)    All other components within normal limits  TROPONIN I - Abnormal; Notable for the following components:   Troponin I 0.03 (*)    All other components within normal limits  URINALYSIS, ROUTINE W REFLEX MICROSCOPIC - Abnormal; Notable for the following components:   APPearance HAZY (*)    Hgb urine dipstick SMALL (*)    Bacteria, UA RARE (*)    All other components within normal limits  TROPONIN I - Abnormal; Notable for the following components:   Troponin I 0.04 (*)    All other components within normal limits  TROPONIN I - Abnormal; Notable for the following components:   Troponin I 0.03 (*)    All other components within normal limits  TROPONIN I - Abnormal; Notable for the following components:   Troponin I 0.03 (*)    All other components within normal limits  D-DIMER, QUANTITATIVE (NOT AT [facility]) - Abnormal; Notable for the following components:   D-Dimer, Quant 3.82 (*)    All other components within normal limits  RAPID URINE DRUG SCREEN, HOSP PERFORMED - Abnormal; Notable for the following components:   Opiates POSITIVE (*)    All other components within normal limits  HEPARIN LEVEL (UNFRACTIONATED) - Abnormal; Notable for the following components:   Heparin Unfractionated 0.17 (*)  All other components within normal limits  CBC - Abnormal; Notable for the following components:    RBC 3.30 (*)    Hemoglobin 10.7 (*)    HCT 31.3 (*)    All other components within normal limits  BASIC METABOLIC PANEL - Abnormal; Notable for the following components:   Potassium 3.1 (*)    Chloride 117 (*)    CO2 18 (*)    Glucose, Bld 134 (*)    BUN 61 (*)    Creatinine, Ser 3.11 (*)    Calcium 7.6 (*)    GFR calc non Af Amer 20 (*)    GFR calc Af Amer 24 (*)    All other components within normal limits  GLUCOSE, CAPILLARY - Abnormal; Notable for the following components:   Glucose-Capillary 111 (*)    All other components within normal limits  GLUCOSE, CAPILLARY - Abnormal; Notable for the following components:   Glucose-Capillary 152 (*)    All other components within normal limits  GLUCOSE, CAPILLARY - Abnormal; Notable for the following components:   Glucose-Capillary 147 (*)    All other components within normal limits  GLUCOSE, CAPILLARY - Abnormal; Notable for the following components:   Glucose-Capillary 174 (*)    All other components within normal limits  GLUCOSE, CAPILLARY - Abnormal; Notable for the following components:   Glucose-Capillary 119 (*)    All other components within normal limits  SARS CORONAVIRUS 2 (HOSPITAL ORDER, [facility])  URINE CULTURE  ETHANOL  CBC WITH DIFFERENTIAL/PLATELET  LIPASE, BLOOD  SODIUM, URINE, RANDOM  CREATININE, URINE, RANDOM  SODIUM, URINE, RANDOM  CREATININE, URINE, RANDOM  LACTIC ACID, PLASMA  PROCALCITONIN  HEPARIN LEVEL (UNFRACTIONATED)    EKG EKG Interpretation  Date/Time:  Tuesday Jan 09 2019 18:18:47 EDT Ventricular Rate:  84 PR Interval:    QRS Duration: 134 QT Interval:  414 QTC Calculation: 490 R Axis:   -99 Text Interpretation:  Sinus rhythm RBBB and LAFB Borderline ST elevation, lateral leads Confirmed by Aletta Edouard [phone]) on 01/09/2019 6:30:11 PM   Radiology Ct Abdomen Pelvis Wo Contrast  Result Date: 01/09/2019 CLINICAL DATA:  Upper abdominal pain with  shortness of breath for several days EXAM: CT ABDOMEN AND PELVIS WITHOUT CONTRAST TECHNIQUE: Multidetector CT imaging of the abdomen and pelvis was performed following the standard protocol without IV contrast. COMPARISON:  10/03/2014 FINDINGS: Lower chest: Mild patchy infiltrate is noted in the bases bilaterally. No focal confluent infiltrate or sizable effusion is noted. Hepatobiliary: No focal liver abnormality is seen. Status post cholecystectomy. No biliary dilatation. Pancreas: Unremarkable. No pancreatic ductal dilatation or surrounding inflammatory changes. Spleen: Normal in size without focal abnormality. Adrenals/Urinary Tract: Adrenal glands are within normal limits. Kidneys demonstrate no renal calculi or urinary tract obstructive changes. Vague hypodensities are noted within the left kidney consistent with cysts stable from the prior exam. No obstructive changes are seen. The bladder is decompressed. Stomach/Bowel: Scattered diverticular change of the colon is noted without evidence of diverticulitis. The appendix is within normal limits. No small bowel abnormality is seen. The stomach is unremarkable with the exception of a small sliding-type hiatal hernia. Vascular/Lymphatic: Aortic atherosclerosis. No enlarged abdominal or pelvic lymph nodes. Reproductive: Changes consistent with prior prostatectomy. Other: No abdominal wall hernia or abnormality. No abdominopelvic ascites. Musculoskeletal: Mild degenerative changes of lumbar spine are noted. Old rib fractures are noted with healing on the left. IMPRESSION: Patchy bibasilar infiltrate without sizable effusion. This likely represents atypical pneumonia. Diverticulosis without  diverticulitis. Chronic changes as described above. Electronically Signed   By: Inez Catalina M.D.   On: 01/09/2019 22:52   Dg Chest Port 1 View  Result Date: 01/09/2019 CLINICAL DATA:  Chest pain for the past 3 days. EXAM: PORTABLE CHEST 1 VIEW COMPARISON:  CT chest and chest  x-ray dated September 08, 2018. FINDINGS: The heart size and mediastinal contours are within normal limits. Normal pulmonary vascularity. No focal consolidation, pleural effusion, or pneumothorax. IMPRESSION: No active disease. Electronically Signed   By: Titus Dubin M.D.   On: 01/09/2019 18:47    Procedures Procedures (including critical care time)  Medications Ordered in ED Medications  morphine 4 MG/ML injection 4 mg (has no administration in time range)  famotidine (PEPCID) IVPB 20 mg premix (has no administration in time range)     Initial Impression / Assessment and Plan / ED Course  I have reviewed the triage vital signs and the nursing notes.  Pertinent labs & imaging results that were available during my care of the patient were reviewed by me and considered in my medical decision making (see chart for details).  Clinical Course as of Jan 09 1617  Tue Jan 09, 2731  [id number] 63 year old male with multiple cardiac risk factors on Eliquis here with chest pain and upper abdominal pain radiating through to his back worse with movement deep breaths and with swallowing liquids and food.  Is a low-grade temperature of 99.1.  Differential diagnosis includes ACS, pneumothorax, PE, reflux, perforation, dissection, musculoskeletal pain.   [MB]  2059 Patient's labs coming back showing a new AKI with a BUN of 75 and a creatinine of 4.1 with his baseline being 0.9.  His troponin is also slightly elevated at 0.03.  I have ordered him to get some IV fluids check a bladder scan and check urinalysis with urine lites.  He will need to be admitted to the hospital.   [MB]  2149 Discussed with Dr. Alcario Drought from the hospitalist service who will evaluate the patient for admission.  He is putting the patient in for a Noncon CT KUB to evaluate for kidney stones.   [MB]  2154 Bladder scan was 28 mL.  We will continue with IV fluids.   [MB]    Clinical Course User Index [MB] Hayden Rasmussen, MD   [name] was evaluated in Emergency Department on 01/09/2019 for the symptoms described in the history of present illness. He was evaluated in the context of the global COVID-19 pandemic, which necessitated consideration that the patient might be at risk for infection with the SARS-CoV-2 virus that causes COVID-19. Institutional protocols and algorithms that pertain to the evaluation of patients at risk for COVID-19 are in a state of rapid change based on information released by regulatory bodies including the CDC and federal and state organizations. These policies and algorithms were followed during the patient's care in the ED.  HEAR Score: 4   Final Clinical Impressions(s) / ED Diagnoses   Final diagnoses:  AKI (acute kidney injury) ([place])  Nonspecific chest pain  Upper abdominal pain    ED Discharge Orders    None       Hayden Rasmussen, MD 01/10/19 [id number]

## 2019-01-09 NOTE — Progress Notes (Signed)
ANTICOAGULATION CONSULT NOTE  Pharmacy Consult for heparin Indication: atrial fibrillation + pulmonary embolus   Patient Measurements: Height: 5\' 9"  (175.3 cm) Weight: 225 lb (102.1 kg) IBW/kg (Calculated) : 70.7 Heparin Dosing Weight: 92.5 kg   Vital Signs: Temp: 97.9 F (36.6 C) (05/26 2146) Temp Source: Oral (05/26 2146) BP: 178/100 (05/26 2146) Pulse Rate: 72 (05/26 2146)  Labs: Recent Labs    01/09/19 1759  HGB 13.7  HCT 41.0  PLT 265  CREATININE 4.11*  TROPONINI 0.03*    Assessment: 63 yo admitted with chest pain. He was previously prescribed Eliquis but he has not been taking it. Planning to start a heparin infusion while working up patient. At some point prior to discharge he will need to be evaluated to see if Eliquis vs Warfarin will be a better option for you. It is unclear if there is an economic barrier at this time. Patient also has AKI on admit, SCr 1 > 4. CBC is stable.   Goal of Therapy:  Heparin level 0.3-0.7 units/ml Monitor platelets by anticoagulation protocol: Yes    Plan:  -Heparin bolus 4000 units x1 then 1300 units/hr -Daily HL, CBC -First level in the morning   Harvel Quale 01/09/2019,10:12 PM

## 2019-01-09 NOTE — ED Notes (Signed)
Bladder scanned pt. result was 28ML

## 2019-01-09 NOTE — ED Notes (Signed)
ED TO INPATIENT HANDOFF REPORT  ED Nurse Name and Phone #: [phone]  S Name/Age/Gender Todd [name]. 63 y.o. male Room/Bed: 034C/034C  Code Status   Code Status: Prior  Home/SNF/Other Home Patient oriented to: self, place, time and situation Is this baseline? Yes   Triage Complete: Triage complete  Chief Complaint chest pain  Triage Note CP 3x days ago with worsening symptoms. EMS repors RBBB. 4mg  Morphine, 324 asprin, 0.4X2 nitro SL. SOB, denies N/V dizziness,    Allergies No Known Allergies  Level of Care/Admitting Diagnosis ED Disposition    None      B Medical/Surgery History Past Medical History:  Diagnosis Date  . Arthritis    "fingers" (07/14/2018)  . Depression   . DVT (deep venous thrombosis) (HCC) LLE  . Hepatitis C     finished harvoni tx ~ 2017  . Hypercholesterolemia   . Hypertension   . Prostate cancer ([place]) 16yrs ago  . Pulmonary embolism and infarction ([place]) 07/13/2018  . Sleep apnea    not currently using cpap, mask causing vertigo  . Type II diabetes mellitus ([place])    Past Surgical History:  Procedure Laterality Date  . COLONOSCOPY WITH PROPOFOL N/A 02/19/2014   Procedure: COLONOSCOPY WITH PROPOFOL;  Surgeon: Garlan Fair, MD;  Location: [facility];  Service: Endoscopy;  Laterality: N/A;  . ENDOVENOUS ABLATION SAPHENOUS VEIN W/ LASER Left 11/22/2017   endovenous laser ablation L SSV by Tinnie Gens MD   . PROSTATECTOMY  2008  . REPAIR QUADRICEPS / HAMSTRING MUSCLE Right      A IV Location/Drains/Wounds Patient Lines/Drains/Airways Status   Active Line/Drains/Airways    Name:   Placement date:   Placement time:   Site:   Days:   Peripheral IV 01/09/19 Left Forearm   01/09/19    1749    Forearm   less than 1          Intake/Output Last 24 hours No intake or output data in the 24 hours ending 01/09/19 2144  Labs/Imaging Results for orders placed or performed during the hospital encounter of 01/09/19 (from the past 48  hour(s))  Comprehensive metabolic panel     Status: Abnormal   Collection Time: 01/09/19  5:59 PM  Result Value Ref Range   Sodium 140 135 - 145 mmol/L   Potassium 4.0 3.5 - 5.1 mmol/L   Chloride 107 98 - 111 mmol/L   CO2 17 (L) 22 - 32 mmol/L   Glucose, Bld 127 (H) 70 - 99 mg/dL   BUN 75 (H) 8 - 23 mg/dL   Creatinine, Ser 4.11 (H) 0.61 - 1.24 mg/dL   Calcium 9.4 8.9 - 10.3 mg/dL   Total Protein 7.8 6.5 - 8.1 g/dL   Albumin 3.3 (L) 3.5 - 5.0 g/dL   AST 26 15 - 41 U/L   ALT 45 (H) 0 - 44 U/L   Alkaline Phosphatase 85 38 - 126 U/L   Total Bilirubin 0.5 0.3 - 1.2 mg/dL   GFR calc non Af Amer 15 (L) >60 mL/min   GFR calc Af Amer 17 (L) >60 mL/min   Anion gap 16 (H) 5 - 15    Comment: Performed at [facility], [address], [place], [place] [address]  Ethanol     Status: None   Collection Time: 01/09/19  5:59 PM  Result Value Ref Range   Alcohol, Ethyl (B) <10 <10 mg/dL    Comment: (NOTE) Lowest detectable limit for serum alcohol is  10 mg/dL. For medical purposes only. Performed at [facility], [place] [address], [place], [place] [address]   Troponin I - Once     Status: Abnormal   Collection Time: 01/09/19  5:59 PM  Result Value Ref Range   Troponin I 0.03 (HH) <0.03 ng/mL    Comment: CRITICAL RESULT CALLED TO, READ BACK BY AND VERIFIED WITH: Guerry Bruin RN AT 2056 ON [id number] BY K FORSYTH Performed at [facility], [place] [address] [place], [place] [address]   CBC with Differential     Status: None   Collection Time: 01/09/19  5:59 PM  Result Value Ref Range   WBC 5.3 4.0 - 10.5 K/uL   RBC 4.28 4.22 - 5.81 MIL/uL   Hemoglobin 13.7 13.0 - 17.0 g/dL   HCT 41.0 39.0 - 52.0 %   MCV 95.8 80.0 - 100.0 fL   MCH 32.0 26.0 - 34.0 pg   MCHC 33.4 30.0 - 36.0 g/dL   RDW 14.6 11.5 - 15.5 %   Platelets 265 150 - 400 K/uL   nRBC 0.0 0.0 - 0.2 %   Neutrophils Relative % 57 %   Neutro Abs 3.0 1.7 - 7.7 K/uL   Lymphocytes Relative 27 %   Lymphs Abs 1.4 0.7 - 4.0  K/uL   Monocytes Relative 13 %   Monocytes Absolute 0.7 0.1 - 1.0 K/uL   Eosinophils Relative 2 %   Eosinophils Absolute 0.1 0.0 - 0.5 K/uL   Basophils Relative 0 %   Basophils Absolute 0.0 0.0 - 0.1 K/uL   Immature Granulocytes 1 %   Abs Immature Granulocytes 0.07 0.00 - 0.07 K/uL    Comment: Performed at [facility], [address] [place], [place] [address]  Lipase, blood     Status: None   Collection Time: 01/09/19  5:59 PM  Result Value Ref Range   Lipase 49 11 - 51 U/L    Comment: Performed at [facility] [address] [place], [place] [address]  SARS Coronavirus 2 (CEPHEID- Performed in [facility]), Hosp Order     Status: None   Collection Time: 01/09/19  6:26 PM  Result Value Ref Range   SARS Coronavirus 2 NEGATIVE NEGATIVE    Comment: (NOTE) If result is NEGATIVE SARS-CoV-2 target nucleic acids are NOT DETECTED. The SARS-CoV-2 RNA is generally detectable in upper and lower  respiratory specimens during the acute phase of infection. The lowest  concentration of SARS-CoV-2 viral copies this assay can detect is 250  copies / mL. A negative result does not preclude SARS-CoV-2 infection  and should not be used as the sole basis for treatment or other  patient management decisions.  A negative result may occur with  improper specimen collection / handling, submission of specimen other  than nasopharyngeal swab, presence of viral mutation(s) within the  areas targeted by this assay, and inadequate number of viral copies  (<250 copies / mL). A negative result must be combined with clinical  observations, patient history, and epidemiological information. If result is POSITIVE SARS-CoV-2 target nucleic acids are DETECTED. The SARS-CoV-2 RNA is generally detectable in upper and lower  respiratory specimens dur ing the acute phase of infection.  Positive  results are indicative of active infection with SARS-CoV-2.  Clinical  correlation with patient  history and other diagnostic information is  necessary to determine patient infection status.  Positive results do  not rule out bacterial infection or co-infection  with other viruses. If result is PRESUMPTIVE POSTIVE SARS-CoV-2 nucleic acids MAY BE PRESENT.   A presumptive positive result was obtained on the submitted specimen  and confirmed on repeat testing.  While 2019 novel coronavirus  (SARS-CoV-2) nucleic acids may be present in the submitted sample  additional confirmatory testing may be necessary for epidemiological  and / or clinical management purposes  to differentiate between  SARS-CoV-2 and other Sarbecovirus currently known to infect humans.  If clinically indicated additional testing with an alternate test  methodology [phone]) is advised. The SARS-CoV-2 RNA is generally  detectable in upper and lower respiratory sp ecimens during the acute  phase of infection. The expected result is Negative. Fact Sheet for Patients:  StrictlyIdeas.no Fact Sheet for Healthcare Providers: BankingDealers.co.za This test is not yet approved or cleared by the [place] FDA and has been authorized for detection and/or diagnosis of SARS-CoV-2 by FDA under an Emergency Use Authorization (EUA).  This EUA will remain in effect (meaning this test can be used) for the duration of the COVID-19 declaration under Section 564(b)(1) of the Act, 21 U.S.C. section 360bbb-3(b)(1), unless the authorization is terminated or revoked sooner. Performed at [facility], [place] [address] [place], [place] [address]    Dg Chest Port 1 View  Result Date: 01/09/2019 CLINICAL DATA:  Chest pain for the past 3 days. EXAM: PORTABLE CHEST 1 VIEW COMPARISON:  CT chest and chest x-ray dated September 08, 2018. FINDINGS: The heart size and mediastinal contours are within normal limits. Normal pulmonary vascularity. No focal consolidation, pleural effusion, or  pneumothorax. IMPRESSION: No active disease. Electronically Signed   By: Titus Dubin M.D.   On: 01/09/2019 18:47    Pending Labs Unresulted Labs (From admission, onward)    Start     Ordered   01/09/19 2100  Sodium, urine, random  Once,   R     01/09/19 2059   01/09/19 2100  Creatinine, urine, random  ONCE - STAT,   STAT     01/09/19 2059   01/09/19 2100  Urinalysis, Routine w reflex microscopic  ONCE - STAT,   STAT     01/09/19 2059   01/09/19 2100  Urine culture  ONCE - STAT,   STAT     01/09/19 2059          Vitals/Pain Today's Vitals   01/09/19 1845 01/09/19 1900 01/09/19 1920 01/09/19 2125  BP:      Pulse: 77 75    Resp: 15 16    Temp:      TempSrc:      SpO2: 97% 97%    Weight:      Height:      PainSc:   10-Worst pain ever 10-Worst pain ever    Isolation Precautions Droplet and Contact precautions  Medications Medications  LORazepam (ATIVAN) tablet 1 mg (has no administration in time range)    Or  LORazepam (ATIVAN) injection 1 mg (has no administration in time range)  thiamine (VITAMIN B-1) tablet 100 mg (has no administration in time range)    Or  thiamine (B-1) injection 100 mg (has no administration in time range)  folic acid (FOLVITE) tablet 1 mg (has no administration in time range)  multivitamin with minerals tablet 1 tablet (has no administration in time range)  morphine 4 MG/ML injection 4 mg (4 mg Intravenous Given 01/09/19 1814)  famotidine (PEPCID) IVPB 20 mg premix (0 mg Intravenous Stopped 01/09/19 1915)  morphine 4 MG/ML injection 4  mg (4 mg Intravenous Given 01/09/19 1929)  sodium chloride 0.9 % bolus 1,000 mL (1,000 mLs Intravenous New Bag/Given 01/09/19 2127)  HYDROmorphone (DILAUDID) injection 1 mg (1 mg Intravenous Given 01/09/19 2129)    Mobility walks Low fall risk   Focused Assessments    R Recommendations: See Admitting Provider Note  Report given to:   Additional Notes:

## 2019-01-09 NOTE — ED Triage Notes (Signed)
CP 3x days ago with worsening symptoms. EMS repors RBBB. 4mg  Morphine, 324 asprin, 0.4X2 nitro SL. SOB, denies N/V dizziness,

## 2019-01-09 NOTE — ED Notes (Signed)
Nurse Navigator communication:Pt declined communication he states "I don't have family.   He is requesting pain medication, Primary RN notified.

## 2019-01-09 NOTE — Progress Notes (Signed)
CT abd/pelvis showing patchy bibasilar infiltrate likely atypical PNA.  Will check procalcitonin and start azithromycin at this point.

## 2019-01-09 NOTE — H&P (Addendum)
History and Physical    [name] TDD:[id number] DOB: Dec 19, 1955 DOA: 01/09/2019  PCP: [name], MD  Patient coming from: Home  I have personally briefly reviewed patient's old medical records in [place]  Chief Complaint: CP  HPI: Todd [name]. is a 63 y.o. 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  Patient is not currently taking eliquis "though I should be".  Patient presents to the ED with c/o central to low CP / epigastric pain that is sharp and stabbing.  Worse with coughing and deep breaths and moving.  Onset about 3 days ago.  Minor amount of cough.  Pain worsening especially with swallowing, poor PO intake over past couple of days.   ED Course: AKF with creat 4.1 and BUN 7.5 (1.0 and 18 in Jan this year).  EtOH level is <10.  UA with small blood otherwise neg.  Bladder scan only shows 28 ml.  CXR neg, COVID neg.  Tm 99.  Trop 0.03.  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   Review of Systems: As per HPI otherwise 10 point review of systems negative.   Past Medical History:  Diagnosis Date   Arthritis    "fingers" (07/14/2018)   Depression    DVT (deep venous thrombosis) (HCC) LLE   Hepatitis C     finished harvoni tx ~ 2017   Hypercholesterolemia    Hypertension    Prostate cancer ([place]) 39yrs ago   Pulmonary embolism and infarction ([place]) 07/13/2018   Sleep apnea    not currently using cpap, mask causing vertigo   Type II diabetes mellitus ([place])     Past Surgical History:  Procedure Laterality Date   COLONOSCOPY WITH PROPOFOL N/A 02/19/2014   Procedure: COLONOSCOPY WITH PROPOFOL;  Surgeon: Garlan Fair, MD;  [facility];  Service: Endoscopy;  Laterality: N/A;   ENDOVENOUS ABLATION SAPHENOUS VEIN W/ LASER Left 11/22/2017   endovenous laser ablation L SSV by Tinnie Gens MD    PROSTATECTOMY  2008   REPAIR QUADRICEPS / HAMSTRING MUSCLE Right      reports that he has been smoking. He has a 5.40 pack-year smoking history. He has never used smokeless tobacco. He reports current alcohol use. He reports previous drug use.  No Known Allergies  Family History  Problem Relation Age of Onset   Cancer Father        PROSTATE     Prior to Admission medications   Medication Sig Start Date End Date Taking? Authorizing Provider  atorvastatin (LIPITOR) 10 MG tablet Take 10 mg by mouth daily at 6 PM.  11/19/17  Yes [provider]  losartan (COZAAR) 50 MG tablet Take 50 mg by mouth daily. 06/15/18  Yes [provider]  metFORMIN (GLUCOPHAGE) 1000 MG tablet Take 1,000 mg by mouth 2 (two) times daily with a meal.   Yes [provider]  Multiple Vitamins-Minerals (CENTRUM SILVER 50+MEN) TABS Take 1 tablet by mouth daily with breakfast.   Yes [provider]  sertraline (ZOLOFT) 100 MG tablet Take 100 mg by mouth every morning.   Yes [provider]  tamsulosin (FLOMAX) 0.4 MG CAPS capsule Take 0.4 mg by mouth every evening.   Yes [provider]  traZODone (DESYREL) 100 MG tablet Take 1 tablet (100 mg total) by mouth at bedtime. Patient taking differently: Take 200 mg by mouth at bedtime.  07/04/18  Yes Pennelope Bracken, MD  DULoxetine (CYMBALTA) 60 MG capsule Take 1 capsule (60 mg total) by mouth daily. Patient not taking: Reported on 01/09/2019 07/05/18   Pennelope Bracken, MD  ELIQUIS STARTER PACK (ELIQUIS STARTER PACK) 5 MG TABS Take as directed on package: start with two-5mg  tablets twice daily for 7 days. On day 8, switch to one-5mg  tablet twice daily. Patient not taking: Reported on 01/09/2019 07/14/18   Patrecia Pour, MD  HYDROcodone-acetaminophen (NORCO/VICODIN) 5-325 MG tablet Take 2 tablets by mouth every 4 (four) hours as needed for severe pain. Patient not taking: Reported on 01/09/2019 09/08/18   Blanchie Dessert, MD  hydrOXYzine (ATARAX/VISTARIL) 50 MG tablet Take 1 tablet  (50 mg total) by mouth every 6 (six) hours as needed for anxiety. Patient not taking: Reported on 01/09/2019 07/04/18   Pennelope Bracken, MD  lidocaine (LIDODERM) 5 % Place 1 patch onto the skin daily. Remove & Discard patch within 12 hours or as directed by MD Patient not taking: Reported on 01/09/2019 09/08/18   Blanchie Dessert, MD  traMADol (ULTRAM) 50 MG tablet Take 2 tablets (100 mg total) by mouth every 6 (six) hours as needed for moderate pain. Patient not taking: Reported on 01/09/2019 07/14/18   Patrecia Pour, MD    Physical Exam: Vitals:   01/09/19 1830 01/09/19 1845 01/09/19 1900 01/09/19 2146  BP: 137/88   (!) 178/100  Pulse: 81 77 75 72  Resp: 15 15 16 20   Temp:    97.9 F (36.6 C)  TempSrc:    Oral  SpO2: 97% 97% 97% 98%  Weight:      Height:        Constitutional: Patient uncomfortable, in pain. Eyes: PERRL, lids and conjunctivae normal ENMT: Mucous membranes are moist. Posterior pharynx clear of any exudate or lesions.Normal dentition.  Neck: normal, supple, no masses, no thyromegaly Respiratory: clear to auscultation bilaterally, no wheezing, no crackles. Normal respiratory effort. No accessory muscle use.  Cardiovascular: Regular rate and rhythm, no murmurs / rubs / gallops. No extremity edema. 2+ pedal pulses. No carotid bruits.  Abdomen: Epigastric TTP Musculoskeletal: no clubbing / cyanosis. No joint deformity upper and lower extremities. Good ROM, no contractures. Normal muscle tone.  Skin: no rashes, lesions, ulcers. No induration Neurologic: CN 2-12 grossly intact. Sensation intact, DTR normal. Strength 5/5 in all 4.  Psychiatric: Normal judgment and insight. Alert and oriented x 3. Normal mood.    Labs on Admission: I have personally reviewed following labs and imaging studies  CBC: Recent Labs  Lab 01/09/19 1759  WBC 5.3  NEUTROABS 3.0  HGB 13.7  HCT 41.0  MCV 95.8  PLT 426   Basic Metabolic Panel: Recent Labs  Lab 01/09/19 1759  NA  140  K 4.0  CL 107  CO2 17*  GLUCOSE 127*  BUN 75*  CREATININE 4.11*  CALCIUM 9.4   GFR: Estimated Creatinine Clearance: 22 mL/min (A) (by C-G formula based on SCr of 4.11 mg/dL (H)). Liver Function Tests: Recent Labs  Lab 01/09/19 1759  AST 26  ALT 45*  ALKPHOS 85  BILITOT 0.5  PROT 7.8  ALBUMIN 3.3*   Recent Labs  Lab 01/09/19 1759  LIPASE 49   No results for input(s): AMMONIA in the last 168 hours. Coagulation Profile: No results for input(s): INR, PROTIME in the last 168 hours. Cardiac Enzymes: Recent Labs  Lab 01/09/19 1759  TROPONINI 0.03*   BNP (last 3 results) No results for input(s): PROBNP in the last 8760 hours. HbA1C: No  results for input(s): HGBA1C in the last 72 hours. CBG: No results for input(s): GLUCAP in the last 168 hours. Lipid Profile: No results for input(s): CHOL, HDL, LDLCALC, TRIG, CHOLHDL, LDLDIRECT in the last 72 hours. Thyroid Function Tests: No results for input(s): TSH, T4TOTAL, FREET4, T3FREE, THYROIDAB in the last 72 hours. Anemia Panel: No results for input(s): VITAMINB12, FOLATE, FERRITIN, TIBC, IRON, RETICCTPCT in the last 72 hours. Urine analysis:    Component Value Date/Time   COLORURINE YELLOW 01/09/2019 2100   APPEARANCEUR HAZY (A) 01/09/2019 2100   LABSPEC 1.012 01/09/2019 2100   PHURINE 5.0 01/09/2019 2100   GLUCOSEU NEGATIVE 01/09/2019 2100   HGBUR SMALL (A) 01/09/2019 2100   BILIRUBINUR NEGATIVE 01/09/2019 2100   KETONESUR NEGATIVE 01/09/2019 2100   PROTEINUR NEGATIVE 01/09/2019 2100   UROBILINOGEN 0.2 12/24/2009 1944   NITRITE NEGATIVE 01/09/2019 2100   LEUKOCYTESUR NEGATIVE 01/09/2019 2100    Radiological Exams on Admission: Dg Chest Port 1 View  Result Date: 01/09/2019 CLINICAL DATA:  Chest pain for the past 3 days. EXAM: PORTABLE CHEST 1 VIEW COMPARISON:  CT chest and chest x-ray dated September 08, 2018. FINDINGS: The heart size and mediastinal contours are within normal limits. Normal pulmonary  vascularity. No focal consolidation, pleural effusion, or pneumothorax. IMPRESSION: No active disease. Electronically Signed   By: Titus Dubin M.D.   On: 01/09/2019 18:47    EKG: Independently reviewed.  Assessment/Plan Principal Problem:   Acute kidney failure (HCC) Active Problems:   DM (diabetes mellitus), type 2 ([place])   Essential hypertension   CHEST PAIN   Alcohol use disorder, severe, dependence ([place])   Opioid use disorder, moderate, dependence ([place])    1. AKF - 1. IVF: NS at 125 cc/hr 2. Strict intake and output 3. Repeat BMP in AM 4. Urine lytes 2. CP / epigastric pain - 1. CT abd/pelvis w/o contrast to r/o obstruction or other acute intra abdominal catastrophy 1. Also checking lactate 2. Serial trops 3. Tele monitor 4. Check D.Dimer 5. PE is very high on the list - putting patient on heparin gtt since he is supposed to be on lifelong anticoagulation anyhow given numerous DVTs and PEs in the past 6. Aortic dissection seems somewhat less likely at day 3 of symptoms. 7. Trying protonix 8. Trying malox 9. Got ASA and NTG with EMS 10. Morphine if all else fails since he says this worked in ED 3. HTN - 1. Continue home BP meds 2. Except losartan due to AKF (which he ran out of last week anyhow). 3. Add PRN hydralazine 4. DM2 - 1. Sensitive SSI Q4H 5. EtOH abuse - 1. CIWA  DVT prophylaxis: Heparin gtt Code Status: Full Family Communication: No family in room Disposition Plan: Home after admit Consults called: None Admission status: Admit to inpatient  Severity of Illness: The appropriate patient status for this patient is INPATIENT. Inpatient status is judged to be reasonable and necessary in order to provide the required intensity of service to ensure the patient's safety. The patient's presenting symptoms, physical exam findings, and initial radiographic and laboratory data in the context of their chronic comorbidities is felt to place them at high risk for  further clinical deterioration. Furthermore, it is not anticipated that the patient will be medically stable for discharge from the hospital within 2 midnights of admission. The following factors support the patient status of inpatient.   Inpatient status for treatment of AKF with creat of 4.1 up from 1.0 baseline.   * I certify  that at the point of admission it is my clinical judgment that the patient will require inpatient hospital care spanning beyond 2 midnights from the point of admission due to high intensity of service, high risk for further deterioration and high frequency of surveillance required.*    Ailanie Ruttan M. DO [facility]  How to contact the [facility] Attending or Consulting provider [place] or covering provider during after hours [place], for this patient?  1. Check the care team in [facility] and look for a) attending/consulting [facility] provider listed and b) the [facility] team listed 2. Log into [email or web address]  Amion Physician Scheduling and messaging for groups and whole hospitals  On call and physician scheduling software for group practices, residents, hospitalists and other medical providers for call, clinic, rotation and shift schedules. OnCall [employer] is a hospital-wide system for scheduling doctors and paging doctors on call. EasyPlot is for scientific plotting and data analysis.  [email or web address]  and use [place]'s universal password to access. If you do not have the password, please contact the hospital operator.  3. Locate the [facility] provider you are looking for under [facility] and page to a number that you can be directly reached. 4. If you still have difficulty reaching the provider, please page the [facility] (Director on Call) for the Hospitalists listed on amion for assistance.  01/09/2019, 10:28 PM

## 2019-01-10 DIAGNOSIS — K219 Gastro-esophageal reflux disease without esophagitis: Secondary | ICD-10-CM

## 2019-01-10 LAB — BASIC METABOLIC PANEL
Anion gap: 8 (ref 5–15)
BUN: 61 mg/dL — ABNORMAL HIGH (ref 8–23)
CO2: 18 mmol/L — ABNORMAL LOW (ref 22–32)
Calcium: 7.6 mg/dL — ABNORMAL LOW (ref 8.9–10.3)
Chloride: 117 mmol/L — ABNORMAL HIGH (ref 98–111)
Creatinine, Ser: 3.11 mg/dL — ABNORMAL HIGH (ref 0.61–1.24)
GFR calc Af Amer: 24 mL/min — ABNORMAL LOW (ref >60)
GFR calc non Af Amer: 20 mL/min — ABNORMAL LOW (ref >60)
Glucose, Bld: 134 mg/dL — ABNORMAL HIGH (ref 70–99)
Potassium: 3.1 mmol/L — ABNORMAL LOW (ref 3.5–5.1)
Sodium: 143 mmol/L (ref 135–145)

## 2019-01-10 LAB — CBC
HCT: 31.3 % — ABNORMAL LOW (ref 39.0–52.0)
Hemoglobin: 10.7 g/dL — ABNORMAL LOW (ref 13.0–17.0)
MCH: 32.4 pg (ref 26.0–34.0)
MCHC: 34.2 g/dL (ref 30.0–36.0)
MCV: 94.8 fL (ref 80.0–100.0)
Platelets: 230 10*3/uL (ref 150–400)
RBC: 3.3 MIL/uL — ABNORMAL LOW (ref 4.22–5.81)
RDW: 14.6 % (ref 11.5–15.5)
WBC: 5.5 10*3/uL (ref 4.0–10.5)
nRBC: 0 % (ref 0.0–0.2)

## 2019-01-10 LAB — GLUCOSE, CAPILLARY
Glucose-Capillary: 152 mg/dL — ABNORMAL HIGH (ref 70–99)
Glucose-Capillary: 147 mg/dL — ABNORMAL HIGH (ref 70–99)
Glucose-Capillary: 174 mg/dL — ABNORMAL HIGH (ref 70–99)
Glucose-Capillary: 119 mg/dL — ABNORMAL HIGH (ref 70–99)
Glucose-Capillary: 118 mg/dL — ABNORMAL HIGH (ref 70–99)

## 2019-01-10 LAB — D-DIMER, QUANTITATIVE: D-Dimer, Quant: 3.82 ug/mL-FEU — ABNORMAL HIGH (ref 0.00–0.50)

## 2019-01-10 LAB — SODIUM, URINE, RANDOM: Sodium, Ur: 29 mmol/L

## 2019-01-10 LAB — HEMOGLOBIN A1C
Hgb A1c MFr Bld: 6.4 % — ABNORMAL HIGH (ref 4.8–5.6)
Mean Plasma Glucose: 136.98 mg/dL

## 2019-01-10 LAB — TROPONIN I
Troponin I: 0.03 ng/mL (ref <0.03)
Troponin I: 0.03 ng/mL (ref <0.03)
Troponin I: 0.04 ng/mL (ref <0.03)

## 2019-01-10 LAB — URINE CULTURE: Culture: <10000 — AB

## 2019-01-10 LAB — LACTIC ACID, PLASMA: Lactic Acid, Venous: 1.6 mmol/L (ref 0.5–1.9)

## 2019-01-10 LAB — CREATININE, URINE, RANDOM: Creatinine, Urine: 104.9 mg/dL

## 2019-01-10 LAB — HEPARIN LEVEL (UNFRACTIONATED)
Heparin Unfractionated: 0.17 IU/mL — ABNORMAL LOW (ref 0.30–0.70)
Heparin Unfractionated: 0.54 IU/mL (ref 0.30–0.70)
Heparin Unfractionated: 0.74 IU/mL — ABNORMAL HIGH (ref 0.30–0.70)

## 2019-01-10 LAB — PROCALCITONIN: Procalcitonin: 0.26 ng/mL

## 2019-01-10 MED ORDER — PANTOPRAZOLE SODIUM 40 MG PO TBEC
40.0000 mg | DELAYED_RELEASE_TABLET | Freq: Two times a day (BID) | ORAL | Status: DC
  Administered 2019-01-10 – 2019-01-11 (×3): 40 mg via ORAL
  Filled 2019-01-10 (×3): qty 1

## 2019-01-10 MED ORDER — LIDOCAINE VISCOUS HCL 2 % MT SOLN
15.0000 mL | Freq: Once | OROMUCOSAL | Status: AC
  Administered 2019-01-10: 15 mL via ORAL
  Filled 2019-01-10: qty 15

## 2019-01-10 MED ORDER — SODIUM CHLORIDE 0.9 % IV SOLN
1.0000 g | Freq: Every day | INTRAVENOUS | Status: DC
  Administered 2019-01-10 – 2019-01-16 (×7): 1 g via INTRAVENOUS
  Filled 2019-01-10 (×8): qty 10

## 2019-01-10 MED ORDER — MORPHINE SULFATE (PF) 4 MG/ML IV SOLN
4.0000 mg | INTRAVENOUS | Status: DC | PRN
  Administered 2019-01-10 – 2019-01-17 (×33): 4 mg via INTRAVENOUS
  Filled 2019-01-10 (×32): qty 1

## 2019-01-10 MED ORDER — ALUM & MAG HYDROXIDE-SIMETH 200-200-20 MG/5ML PO SUSP
30.0000 mL | Freq: Four times a day (QID) | ORAL | Status: DC | PRN
  Administered 2019-01-13: 14:00:00 30 mL via ORAL
  Filled 2019-01-10: qty 30

## 2019-01-10 MED ORDER — SUCRALFATE 1 GM/10ML PO SUSP
1.0000 g | Freq: Two times a day (BID) | ORAL | Status: DC
  Administered 2019-01-10 – 2019-01-11 (×2): 1 g via ORAL
  Filled 2019-01-10 (×2): qty 10

## 2019-01-10 MED ORDER — HEPARIN BOLUS VIA INFUSION
2000.0000 [IU] | Freq: Once | INTRAVENOUS | Status: AC
  Administered 2019-01-10: 2000 [IU] via INTRAVENOUS
  Filled 2019-01-10: qty 2000

## 2019-01-10 NOTE — Progress Notes (Signed)
ANTICOAGULATION CONSULT NOTE Pharmacy Consult for heparin Indication: hx atrial fibrillation + pulmonary embolus   Patient Measurements: Height: 5\' 9"  (175.3 cm) Weight: 227 lb 11.8 oz (103.3 kg) IBW/kg (Calculated) : 70.7 Heparin Dosing Weight: 92.5 kg   Vital Signs: Temp: 98.4 F (36.9 C) (05/27 2028) Temp Source: Oral (05/27 2028) BP: 163/93 (05/27 1235) Pulse Rate: 68 (05/27 1235)  Labs: Recent Labs    01/09/19 1759 01/09/19 2354 01/10/19 0534 01/10/19 0923 01/10/19 1358 01/10/19 2101  HGB 13.7  --  10.7*  --   --   --   HCT 41.0  --  31.3*  --   --   --   PLT 265  --  230  --   --   --   HEPARINUNFRC  --   --  0.17*  --  0.54 0.74*  CREATININE 4.11*  --  3.11*  --   --   --   TROPONINI 0.03* 0.04* 0.03* 0.03*  --   --     Assessment: 63 y.o. M presented to ED with 3d hx of chest pain, worse with cough and inspiration. Also noted new AKI.  Pt has hx of afib and recurrent VTEs, noncompliant with PTA Eliiquis.  Heparin level now supratherapeutic at 0.74.  Goal of Therapy:  Heparin level 0.3-0.7 units/ml Monitor platelets by anticoagulation protocol: Yes   Plan:  Reduce Heparin to 1500 units/hr Recheck heparin level with morning lab draw   Arrie Senate, PharmD, BCPS Clinical Pharmacist [phone] Please check AMION for all [place] numbers 01/10/2019

## 2019-01-10 NOTE — Progress Notes (Signed)
[place] for heparin Indication: atrial fibrillation + pulmonary embolus   Patient Measurements: Height: 5\' 9"  (175.3 cm) Weight: 227 lb 11.8 oz (103.3 kg) IBW/kg (Calculated) : 70.7 Heparin Dosing Weight: 92.5 kg   Vital Signs: Temp: 98.8 F (37.1 C) (05/27 0425) Temp Source: Oral (05/27 0425) BP: 170/100 (05/26 2315) Pulse Rate: 72 (05/26 2315)  Labs: Recent Labs    01/09/19 1759 01/09/19 2354 01/10/19 0534  HGB 13.7  --  10.7*  HCT 41.0  --  31.3*  PLT 265  --  230  HEPARINUNFRC  --   --  0.17*  CREATININE 4.11*  --  3.11*  TROPONINI 0.03* 0.04*  --     Assessment: 63 y.o. male with h/o Afib and PE for heparin  Goal of Therapy:  Heparin level 0.3-0.7 units/ml Monitor platelets by anticoagulation protocol: Yes    Plan:  Heparin 2000 units IV bolus, then increase heparin  1550 units/hr Check heparin level in 6 hours.   Maybree Riling, [place]onson Curb 01/10/2019,6:20 AM

## 2019-01-10 NOTE — Progress Notes (Signed)
PROGRESS NOTE    Todd [name].  ZOX:[id number] DOB: [birth year] DOA: 01/09/2019 PCP: [name], MD     Brief Narrative:   63 y.o. 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  Patient is not currently taking eliquis "though I should be".  Patient presents to the ED with c/o central to low CP / epigastric pain that is sharp and stabbing.  Worse with coughing and deep breaths and moving. Onset about 3 days ago.  Minor amount of cough.  Pain worsening especially with swallowing, poor PO intake over past couple of days.  ED Course: AKF with creat 4.1 and BUN 7.5 (1.0 and 18 in Jan this year).  EtOH level is <10.  UA with small blood otherwise neg.  Bladder scan only shows 28 ml.  CXR neg, COVID neg.  Tm 99.  Trop 0.03.   Assessment & Plan: 1-Acute kidney failure ([place]) -Continue minimizing Nephrotoxic agents -Continue IV fluids -Continue to follow I's and O's. -Cr Function improving.  2-DM (diabetes mellitus), type 2 ([place]) -Avoid oral hypoglycemic agents while inpatient -Continue sliding scale insulin -Check A1c.  3-Essential hypertension -Blood pressure stable -Continue PRN hydralazine.  4-CHEST PAIN -Atypical -Continue treatment for noncardiac causes including treatment for gastroesophageal reflux disease/esophagitis and pneumonia. -Will use PPI, Carafate and as needed GI cocktail. -Admit treatment with IV antibiotics -Continue PRN pain medications- -follow clinical response.  5-Alcohol use disorder, severe, dependence ([place]) -will continue CIWA protocol -continue thiamine and folic acid -cessation counseling provided.  6-Opioid use disorder, moderate, dependence ([place]) -continue current dose of narcotics -avoid overuse.  7-BPH -continue flomax  8-depression/anxiety -No suicidal ideation or hallucination -continue trazodone and Zoloft.  DVT prophylaxis: Heparin drip Code Status:  Full code Family Communication: No family at bedside Disposition Plan: Continue IV antibiotics for concerns of pneumonia seen on CT scan, troponin with flat elevation and no abnormalities seen on telemetry or EKGs.  Continue Protonix, start Carafate and use as needed GI cocktail. Continue heparin.  Consultants:   None   Procedures:   See below for x-ray reports.   Antimicrobials:  Anti-infectives (From admission, onward)   Start     Dose/Rate Route Frequency Ordered Stop   01/10/19 1800  azithromycin (ZITHROMAX) tablet 250 mg     250 mg Oral Daily 01/09/19 2325 01/14/19 1759   01/10/19 0800  cefTRIAXone (ROCEPHIN) 1 g in sodium chloride 0.9 % 100 mL IVPB     1 g 200 mL/hr over 30 Minutes Intravenous Daily 01/10/19 0722     01/10/19 0000  azithromycin (ZITHROMAX) tablet 500 mg     500 mg Oral Daily 01/09/19 2325 01/10/19 0100      Subjective: Afebrile, still complaining of epigastric/lower chest discomfort.  No nausea, no vomiting. No palpitations.  Objective: Vitals:   01/10/19 0208 01/10/19 0425 01/10/19 0808 01/10/19 1235  BP:    (!) 163/93  Pulse:    68  Resp:    17  Temp: 98.2 F (36.8 C) 98.8 F (37.1 C) 98 F (36.7 C) 98.2 F (36.8 C)  TempSrc: Oral Oral Oral Oral  SpO2:      Weight:      Height:        Intake/Output Summary (Last 24 hours) at 01/10/2019 1653 Last data filed at 01/10/2019 0600 Gross per 24 hour  Intake 908.09 ml  Output 450 ml  Net 458.09 ml   Filed Weights   01/09/19 1756 01/10/19 0000  Weight: 102.1 kg 103.3 kg    Examination: General exam: Alert, awake, oriented x 3; complaining of epigastric/lower chest discomfort.  No shortness of breath, no palpitations, no nausea, no vomiting. Respiratory system: Clear to auscultation. Respiratory effort normal. Cardiovascular system:RRR. No murmurs, rubs, gallops. Gastrointestinal system: Abdomen is nondistended, soft and nontender. No organomegaly or masses felt. Normal bowel sounds heard.  Central nervous system: Alert and oriented. No focal neurological deficits. Extremities: No C/C/E, +pedal pulses Skin: No rashes, lesions or ulcers Psychiatry: Judgement and insight appear normal. Mood & affect appropriate.     Data Reviewed: I have personally reviewed following labs and imaging studies  CBC: Recent Labs  Lab 01/09/19 1759 01/10/19 0534  WBC 5.3 5.5  NEUTROABS 3.0  --   HGB 13.7 10.7*  HCT 41.0 31.3*  MCV 95.8 94.8  PLT 265 295   Basic Metabolic Panel: Recent Labs  Lab 01/09/19 1759 01/10/19 0534  NA 140 143  K 4.0 3.1*  CL 107 117*  CO2 17* 18*  GLUCOSE 127* 134*  BUN 75* 61*  CREATININE 4.11* 3.11*  CALCIUM 9.4 7.6*   GFR: Estimated Creatinine Clearance: 29.2 mL/min (A) (by C-G formula based on SCr of 3.11 mg/dL (H)).   Liver Function Tests: Recent Labs  Lab 01/09/19 1759  AST 26  ALT 45*  ALKPHOS 85  BILITOT 0.5  PROT 7.8  ALBUMIN 3.3*   Recent Labs  Lab 01/09/19 1759  LIPASE 49   Cardiac Enzymes: Recent Labs  Lab 01/09/19 1759 01/09/19 2354 01/10/19 0534 01/10/19 0923  TROPONINI 0.03* 0.04* 0.03* 0.03*   CBG: Recent Labs  Lab 01/09/19 2353 01/10/19 0410 01/10/19 0806 01/10/19 1234 01/10/19 1602  GLUCAP 111* 152* 147* 174* 119*   Urine analysis:    Component Value Date/Time   COLORURINE YELLOW 01/09/2019 2100   APPEARANCEUR HAZY (A) 01/09/2019 2100   LABSPEC 1.012 01/09/2019 2100   PHURINE 5.0 01/09/2019 2100   GLUCOSEU NEGATIVE 01/09/2019 2100   HGBUR SMALL (A) 01/09/2019 2100   BILIRUBINUR NEGATIVE 01/09/2019 2100   KETONESUR NEGATIVE 01/09/2019 2100   PROTEINUR NEGATIVE 01/09/2019 2100   UROBILINOGEN 0.2 12/24/2009 1944   NITRITE NEGATIVE 01/09/2019 2100   LEUKOCYTESUR NEGATIVE 01/09/2019 2100    Recent Results (from the past 240 hour(s))  SARS Coronavirus 2 (CEPHEID- Performed in [place] hospital lab), Hosp Order     Status: None   Collection Time: 01/09/19  6:26 PM  Result Value Ref Range Status    SARS Coronavirus 2 NEGATIVE NEGATIVE Final    Comment: (NOTE) If result is NEGATIVE SARS-CoV-2 target nucleic acids are NOT DETECTED. The SARS-CoV-2 RNA is generally detectable in upper and lower  respiratory specimens during the acute phase of infection. The lowest  concentration of SARS-CoV-2 viral copies this assay can detect is 250  copies / mL. A negative result does not preclude SARS-CoV-2 infection  and should not be used as the sole basis for treatment or other  patient management decisions.  A negative result may occur with  improper specimen collection / handling, submission of specimen other  than nasopharyngeal swab, presence of viral mutation(s) within the  areas targeted by this assay, and inadequate number of viral copies  (<250 copies / mL). A negative result must be combined with clinical  observations, patient history, and epidemiological information. If result is POSITIVE SARS-CoV-2 target nucleic acids are DETECTED. The SARS-CoV-2 RNA is generally detectable in upper and lower  respiratory specimens dur ing the acute phase of infection.  Positive  results are indicative of active infection with SARS-CoV-2.  Clinical  correlation with patient history and other diagnostic information is  necessary to determine patient infection status.  Positive results do  not rule out bacterial infection or co-infection with other viruses. If result is PRESUMPTIVE POSTIVE SARS-CoV-2 nucleic acids MAY BE PRESENT.   A presumptive positive result was obtained on the submitted specimen  and confirmed on repeat testing.  While 2019 novel coronavirus  (SARS-CoV-2) nucleic acids may be present in the submitted sample  additional confirmatory testing may be necessary for epidemiological  and / or clinical management purposes  to differentiate between  SARS-CoV-2 and other Sarbecovirus currently known to infect humans.  If clinically indicated additional testing with an alternate test   methodology [phone]) is advised. The SARS-CoV-2 RNA is generally  detectable in upper and lower respiratory sp ecimens during the acute  phase of infection. The expected result is Negative. Fact Sheet for Patients:  StrictlyIdeas.no Fact Sheet for Healthcare Providers: BankingDealers.co.za This test is not yet approved or cleared by the [place] FDA and has been authorized for detection and/or diagnosis of SARS-CoV-2 by FDA under an Emergency Use Authorization (EUA).  This EUA will remain in effect (meaning this test can be used) for the duration of the COVID-19 declaration under Section 564(b)(1) of the Act, 21 U.S.C. section 360bbb-3(b)(1), unless the authorization is terminated or revoked sooner. Performed at [facility], [place] [address] [place], [place] [address]      Radiology Studies: Ct Abdomen Pelvis Wo Contrast  Result Date: 01/09/2019 CLINICAL DATA:  Upper abdominal pain with shortness of breath for several days EXAM: CT ABDOMEN AND PELVIS WITHOUT CONTRAST TECHNIQUE: Multidetector CT imaging of the abdomen and pelvis was performed following the standard protocol without IV contrast. COMPARISON:  10/03/2014 FINDINGS: Lower chest: Mild patchy infiltrate is noted in the bases bilaterally. No focal confluent infiltrate or sizable effusion is noted. Hepatobiliary: No focal liver abnormality is seen. Status post cholecystectomy. No biliary dilatation. Pancreas: Unremarkable. No pancreatic ductal dilatation or surrounding inflammatory changes. Spleen: Normal in size without focal abnormality. Adrenals/Urinary Tract: Adrenal glands are within normal limits. Kidneys demonstrate no renal calculi or urinary tract obstructive changes. Vague hypodensities are noted within the left kidney consistent with cysts stable from the prior exam. No obstructive changes are seen. The bladder is decompressed. Stomach/Bowel: Scattered diverticular  change of the colon is noted without evidence of diverticulitis. The appendix is within normal limits. No small bowel abnormality is seen. The stomach is unremarkable with the exception of a small sliding-type hiatal hernia. Vascular/Lymphatic: Aortic atherosclerosis. No enlarged abdominal or pelvic lymph nodes. Reproductive: Changes consistent with prior prostatectomy. Other: No abdominal wall hernia or abnormality. No abdominopelvic ascites. Musculoskeletal: Mild degenerative changes of lumbar spine are noted. Old rib fractures are noted with healing on the left. IMPRESSION: Patchy bibasilar infiltrate without sizable effusion. This likely represents atypical pneumonia. Diverticulosis without diverticulitis. Chronic changes as described above. Electronically Signed   By: Inez Catalina M.D.   On: 01/09/2019 22:52   Dg Chest Port 1 View  Result Date: 01/09/2019 CLINICAL DATA:  Chest pain for the past 3 days. EXAM: PORTABLE CHEST 1 VIEW COMPARISON:  CT chest and chest x-ray dated September 08, 2018. FINDINGS: The heart size and mediastinal contours are within normal limits. Normal pulmonary vascularity. No focal consolidation, pleural effusion, or pneumothorax. IMPRESSION: No active disease. Electronically Signed   By: Titus Dubin M.D.   On: 01/09/2019 18:47  Scheduled Meds: . atorvastatin  10 mg Oral q1800  . azithromycin  250 mg Oral Daily  . folic acid  1 mg Oral Daily  . insulin aspart  0-9 Units Subcutaneous Q4H  . lidocaine  15 mL Oral Once  . multivitamin with minerals  1 tablet Oral Daily  . pantoprazole  40 mg Oral BID  . sertraline  100 mg Oral q morning - 10a  . sucralfate  1 g Oral BID  . tamsulosin  0.4 mg Oral QPM  . thiamine  100 mg Oral Daily   Or  . thiamine  100 mg Intravenous Daily  . traZODone  200 mg Oral QHS   Continuous Infusions: . sodium chloride 125 mL/hr at 01/10/19 0600  . cefTRIAXone (ROCEPHIN)  IV 1 g (01/10/19 0812)  . heparin 1,550 Units/hr (01/10/19 1255)      LOS: 1 day    Time spent: 35 minutes. Greater than 50% of this time was spent in direct contact with the patient, coordinating care and discussing relevant ongoing clinical issues, including ongoing chest discomfort, findings on flat elevation of his troponin.   Barton Dubois, MD [facility] Pager [phone]  01/10/2019, 4:53 PM

## 2019-01-10 NOTE — Progress Notes (Signed)
[place] for heparin Indication: hx atrial fibrillation + pulmonary embolus   Patient Measurements: Height: 5\' 9"  (175.3 cm) Weight: 227 lb 11.8 oz (103.3 kg) IBW/kg (Calculated) : 70.7 Heparin Dosing Weight: 92.5 kg   Vital Signs: Temp: 98.2 F (36.8 C) (05/27 1235) Temp Source: Oral (05/27 1235) BP: 163/93 (05/27 1235) Pulse Rate: 68 (05/27 1235)  Labs: Recent Labs    01/09/19 1759 01/09/19 2354 01/10/19 0534 01/10/19 0923 01/10/19 1358  HGB 13.7  --  10.7*  --   --   HCT 41.0  --  31.3*  --   --   PLT 265  --  230  --   --   HEPARINUNFRC  --   --  0.17*  --  0.54  CREATININE 4.11*  --  3.11*  --   --   TROPONINI 0.03* 0.04* 0.03* 0.03*  --     Assessment: 63 y.o. M presented to ED with 3d hx of chest pain, worse with cough and inspiration. Also noted new AKI.  Pt has hx of afib and recurrent VTEs, noncompliant with PTA Eliiquis.  Heparin level now therapeutic on 1550 units/hr.  Goal of Therapy:  Heparin level 0.3-0.7 units/ml Monitor platelets by anticoagulation protocol: Yes   Plan:  Continue Heparin at 1550 units/hr Check heparin level in 6 hours for confirmation.   [employer], Pharm.D., BCPS Clinical Pharmacist Clinical phone for 01/10/2019 from 8:30-4:00 is [phone].  **Pharmacist phone directory can now be found on [email or web address] ([email or web address]).  Listed under [place].  01/10/2019 2:53 PM

## 2019-01-11 LAB — GLUCOSE, CAPILLARY
Glucose-Capillary: 108 mg/dL — ABNORMAL HIGH (ref 70–99)
Glucose-Capillary: 122 mg/dL — ABNORMAL HIGH (ref 70–99)
Glucose-Capillary: 128 mg/dL — ABNORMAL HIGH (ref 70–99)
Glucose-Capillary: 125 mg/dL — ABNORMAL HIGH (ref 70–99)
Glucose-Capillary: 109 mg/dL — ABNORMAL HIGH (ref 70–99)
Glucose-Capillary: 114 mg/dL — ABNORMAL HIGH (ref 70–99)

## 2019-01-11 LAB — BASIC METABOLIC PANEL
Anion gap: 12 (ref 5–15)
BUN: 48 mg/dL — ABNORMAL HIGH (ref 8–23)
CO2: 20 mmol/L — ABNORMAL LOW (ref 22–32)
Calcium: 8.5 mg/dL — ABNORMAL LOW (ref 8.9–10.3)
Chloride: 111 mmol/L (ref 98–111)
Creatinine, Ser: 2.85 mg/dL — ABNORMAL HIGH (ref 0.61–1.24)
GFR calc Af Amer: 26 mL/min — ABNORMAL LOW (ref >60)
GFR calc non Af Amer: 23 mL/min — ABNORMAL LOW (ref >60)
Glucose, Bld: 105 mg/dL — ABNORMAL HIGH (ref 70–99)
Potassium: 3.6 mmol/L (ref 3.5–5.1)
Sodium: 143 mmol/L (ref 135–145)

## 2019-01-11 LAB — CBC
HCT: 33.5 % — ABNORMAL LOW (ref 39.0–52.0)
Hemoglobin: 11.4 g/dL — ABNORMAL LOW (ref 13.0–17.0)
MCH: 32.2 pg (ref 26.0–34.0)
MCHC: 34 g/dL (ref 30.0–36.0)
MCV: 94.6 fL (ref 80.0–100.0)
Platelets: 255 10*3/uL (ref 150–400)
RBC: 3.54 MIL/uL — ABNORMAL LOW (ref 4.22–5.81)
RDW: 14.2 % (ref 11.5–15.5)
WBC: 5.5 10*3/uL (ref 4.0–10.5)
nRBC: 0 % (ref 0.0–0.2)

## 2019-01-11 LAB — HEPARIN LEVEL (UNFRACTIONATED)
Heparin Unfractionated: 0.17 IU/mL — ABNORMAL LOW (ref 0.30–0.70)
Heparin Unfractionated: 0.59 IU/mL (ref 0.30–0.70)
Heparin Unfractionated: 0.74 IU/mL — ABNORMAL HIGH (ref 0.30–0.70)

## 2019-01-11 LAB — SURGICAL PCR SCREEN
MRSA, PCR: NEGATIVE
Staphylococcus aureus: NEGATIVE

## 2019-01-11 LAB — MAGNESIUM: Magnesium: 1.6 mg/dL — ABNORMAL LOW (ref 1.7–2.4)

## 2019-01-11 MED ORDER — HEPARIN (PORCINE) 25000 UT/250ML-% IV SOLN
1600.0000 [IU]/h | INTRAVENOUS | Status: AC
  Administered 2019-01-11: 17:00:00 1700 [IU]/h via INTRAVENOUS

## 2019-01-11 NOTE — Consult Note (Signed)
Referring Provider:   Dr. Barton Dubois Primary Care Physician:  [name], MD Primary Gastroenterologist: None (former patient of Dr. Howell Rucks)  Reason for Consultation: Chest pain, odynophagia, dysphagia  HPI: Todd [name]. is a 63 y.o. male who has not had ongoing GI problems despite a history of significant ethanol consumption.  He does have a history of unprovoked DVTs x2 with pulmonary embolism for which, nominally, he was supposed to be taking Eliquis as an outpatient but was not compliant with that medication.  He was admitted to the hospital 2 days ago with a roughly 1 week history of intense chest pain, and upper abdominal pain, associated with excruciating odynophagia and secondary dysphagia, and basically inability to eat for the past 4 days.  Even small sips of water are very uncomfortable.  Deep breathing makes the pain worse, as does getting up and moving around.  He denies recent exposure to antibiotics that may have made him susceptible, for example, to candidiasis of the esophagus; furthermore, he denies any history of a pill impaction that might of caused a pill induced caustic esophagitis.  A CT of the abdomen was unrevealing.  Therapeutic trials of GI cocktail, PPI, and sucralfate have been ineffective.   Past Medical History:  Diagnosis Date  . Arthritis    "fingers" (07/14/2018)  . Depression   . DVT (deep venous thrombosis) (HCC) LLE  . Hepatitis C     finished harvoni tx ~ 2017  . Hypercholesterolemia   . Hypertension   . Prostate cancer ([place]) 65yrs ago  . Pulmonary embolism and infarction ([place]) 07/13/2018  . Sleep apnea    not currently using cpap, mask causing vertigo  . Type II diabetes mellitus ([place])     Past Surgical History:  Procedure Laterality Date  . COLONOSCOPY WITH PROPOFOL N/A 02/19/2014   Procedure: COLONOSCOPY WITH PROPOFOL;  Surgeon: Garlan Fair, MD;  Location: [facility];  Service: Endoscopy;  Laterality: N/A;  .  ENDOVENOUS ABLATION SAPHENOUS VEIN W/ LASER Left 11/22/2017   endovenous laser ablation L SSV by Tinnie Gens MD   . PROSTATECTOMY  2008  . REPAIR QUADRICEPS / HAMSTRING MUSCLE Right     Prior to Admission medications   Medication Sig Start Date End Date Taking? Authorizing Provider  atorvastatin (LIPITOR) 10 MG tablet Take 10 mg by mouth daily at 6 PM.  11/19/17  Yes [provider]  losartan (COZAAR) 50 MG tablet Take 50 mg by mouth daily. 06/15/18  Yes [provider]  metFORMIN (GLUCOPHAGE) 1000 MG tablet Take 1,000 mg by mouth 2 (two) times daily with a meal.   Yes [provider]  Multiple Vitamins-Minerals (CENTRUM SILVER 50+MEN) TABS Take 1 tablet by mouth daily with breakfast.   Yes [provider]  sertraline (ZOLOFT) 100 MG tablet Take 100 mg by mouth every morning.   Yes [provider]  tamsulosin (FLOMAX) 0.4 MG CAPS capsule Take 0.4 mg by mouth every evening.   Yes [provider]  traZODone (DESYREL) 100 MG tablet Take 1 tablet (100 mg total) by mouth at bedtime. Patient taking differently: Take 200 mg by mouth at bedtime.  07/04/18  Yes Pennelope Bracken, MD  DULoxetine (CYMBALTA) 60 MG capsule Take 1 capsule (60 mg total) by mouth daily. Patient not taking: Reported on 01/09/2019 07/05/18   Pennelope Bracken, MD  ELIQUIS STARTER PACK (ELIQUIS STARTER PACK) 5 MG TABS Take as directed on package: start with two-5mg  tablets twice daily for 7 days.  On day 8, switch to one-5mg  tablet twice daily. Patient not taking: Reported on 01/09/2019 07/14/18   Patrecia Pour, MD  HYDROcodone-acetaminophen (NORCO/VICODIN) 5-325 MG tablet Take 2 tablets by mouth every 4 (four) hours as needed for severe pain. Patient not taking: Reported on 01/09/2019 09/08/18   Blanchie Dessert, MD  hydrOXYzine (ATARAX/VISTARIL) 50 MG tablet Take 1 tablet (50 mg total) by mouth every 6 (six) hours as needed for anxiety. Patient not taking: Reported  on 01/09/2019 07/04/18   Pennelope Bracken, MD  lidocaine (LIDODERM) 5 % Place 1 patch onto the skin daily. Remove & Discard patch within 12 hours or as directed by MD Patient not taking: Reported on 01/09/2019 09/08/18   Blanchie Dessert, MD  traMADol (ULTRAM) 50 MG tablet Take 2 tablets (100 mg total) by mouth every 6 (six) hours as needed for moderate pain. Patient not taking: Reported on 01/09/2019 07/14/18   Patrecia Pour, MD    Current Facility-Administered Medications  Medication Dose Route Frequency Provider Last Rate Last Dose  . 0.9 %  sodium chloride infusion   Intravenous Continuous Etta Quill, DO 125 mL/hr at 01/11/19 1529    . acetaminophen (TYLENOL) tablet 650 mg  650 mg Oral Q4H PRN Etta Quill, DO      . alum & mag hydroxide-simeth (MAALOX/MYLANTA) 200-200-20 MG/5ML suspension 30 mL  30 mL Oral Q6H PRN Barton Dubois, MD      . atorvastatin (LIPITOR) tablet 10 mg  10 mg Oral q1800 Jennette Kettle M, DO   10 mg at 01/10/19 1708  . azithromycin (ZITHROMAX) tablet 250 mg  250 mg Oral Daily Jennette Kettle M, DO   250 mg at 01/10/19 1850  . cefTRIAXone (ROCEPHIN) 1 g in sodium chloride 0.9 % 100 mL IVPB  1 g Intravenous Daily Barton Dubois, MD 200 mL/hr at 01/11/19 0853 1 g at 01/11/19 0853  . folic acid (FOLVITE) tablet 1 mg  1 mg Oral Daily Jennette Kettle M, DO   1 mg at 01/11/19 0844  . heparin ADULT infusion 100 units/mL (25000 units/260mL sodium chloride 0.45%)  1,700 Units/hr Intravenous Continuous Hammons, Kimberly B, RPH      . hydrALAZINE (APRESOLINE) injection 5 mg  5 mg Intravenous Q4H PRN Etta Quill, DO   5 mg at 01/11/19 1015  . insulin aspart (novoLOG) injection 0-9 Units  0-9 Units Subcutaneous Q4H Etta Quill, DO   1 Units at 01/11/19 1336  . LORazepam (ATIVAN) tablet 1 mg  1 mg Oral Q6H PRN Etta Quill, DO       Or  . LORazepam (ATIVAN) injection 1 mg  1 mg Intravenous Q6H PRN Etta Quill, DO   1 mg at 01/10/19 2017  . morphine 4  MG/ML injection 4 mg  4 mg Intravenous Q4H PRN Barton Dubois, MD   4 mg at 01/11/19 1338  . multivitamin with minerals tablet 1 tablet  1 tablet Oral Daily Jennette Kettle M, DO   1 tablet at 01/11/19 0844  . ondansetron (ZOFRAN) injection 4 mg  4 mg Intravenous Q6H PRN Etta Quill, DO      . pantoprazole (PROTONIX) EC tablet 40 mg  40 mg Oral BID Barton Dubois, MD   40 mg at 01/11/19 0844  . sertraline (ZOLOFT) tablet 100 mg  100 mg Oral q morning - 10a Etta Quill, DO   100 mg at 01/11/19 0844  . sucralfate (CARAFATE) 1 GM/10ML suspension 1 g  1  g Oral BID Barton Dubois, MD   1 g at 01/11/19 0844  . tamsulosin (FLOMAX) capsule 0.4 mg  0.4 mg Oral QPM Jennette Kettle M, DO   0.4 mg at 01/10/19 1708  . thiamine (VITAMIN B-1) tablet 100 mg  100 mg Oral Daily Jennette Kettle M, DO   100 mg at 01/11/19 0845   Or  . thiamine (B-1) injection 100 mg  100 mg Intravenous Daily Jennette Kettle M, DO      . traZODone (DESYREL) tablet 200 mg  200 mg Oral QHS Jennette Kettle M, DO   200 mg at 01/10/19 2201    Allergies as of 01/09/2019  . (No Known Allergies)    Family History  Problem Relation Age of Onset  . Cancer Father        PROSTATE    Social History   Socioeconomic History  . Marital status: Single    Spouse name: Not on file  . Number of children: 2  . Years of education: 12th  . Highest education level: Not on file  Occupational History  . Occupation: post office  Social Needs  . Financial resource strain: Not on file  . Food insecurity:    Worry: Not on file    Inability: Not on file  . Transportation needs:    Medical: Not on file    Non-medical: Not on file  Tobacco Use  . Smoking status: Current Every Day Smoker    Packs/day: 0.12    Years: 45.00    Pack years: 5.40  . Smokeless tobacco: Never Used  Substance and Sexual Activity  . Alcohol use: Yes    Comment: 07/14/2018 "40oz beer/day"  . 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 Sexual  activity: Not Currently  Lifestyle  . Physical activity:    Days per week: Not on file    Minutes per session: Not on file  . Stress: Not on file  Relationships  . Social connections:    Talks on phone: Not on file    Gets together: Not on file    Attends religious service: Not on file    Active member of club or organization: Not on file    Attends meetings of clubs or organizations: Not on file    Relationship status: Not on file  . Intimate partner violence:    Fear of current or ex partner: Not on file    Emotionally abused: Not on file    Physically abused: Not on file    Forced sexual activity: Not on file  Other Topics Concern  . Not on file  Social History Narrative   ** Merged History Encounter **       Patient lives at home alone.Marland KitchenMarland KitchenDrinks coffee daily     Review of Systems: Positive for above-mentioned chest pain, odynophagia, and dysphagia, also absence of bowel movements with essentially no food intake for several days.  Some knee and leg discomfort, also.  Negative for shortness of breath (although pain increases with deep breathing), skin rashes, lymphadenopathy, stomatitis symptoms, urinary symptoms (other than frequency associated with his diabetes).  Physical Exam: Vital signs in last 24 hours: Temp:  [98.1 F (36.7 C)-98.7 F (37.1 C)] 98.1 F (36.7 C) (05/28 0845) Pulse Rate:  [65] 65 (05/28 1143) Resp:  [15] 15 (05/28 1143) BP: (170-188)/(83-97) 170/83 (05/28 1143) SpO2:  [94 %] 94 % (05/28 1143)   General:   Alert,  Well-developed, well-nourished, pleasant and cooperative  with periodic grimacing and groaning due to pain. Head:  Normocephalic and atraumatic. Eyes:  Sclera clear, no icterus.   Conjunctiva pink. Mouth:   No ulcerations or lesions.  Oropharynx pink & moist. Lungs:  Clear throughout to auscultation.   No wheezes, crackles, or rhonchi. No evident respiratory distress. Heart:   Regular rate and rhythm; no murmurs, clicks, rubs,  or  gallops. Abdomen: Nondistended, mild upper abdominal discomfort without significant guarding or peritoneal findings. Msk:   Symmetrical without gross deformities. Pulses:  Normal radial pulse is noted. Extremities:   Without clubbing, cyanosis, or edema. Neurologic:  Alert and coherent;  grossly normal neurologically. Skin:  Intact without significant lesions or rashes. Psych:   Alert and cooperative. Normal mood and affect.  Intake/Output from previous day: 05/27 0701 - 05/28 0700 In: -  Out: 475 [Urine:475] Intake/Output this shift: Total I/O In: -  Out: 1050 [Urine:1050]  Lab Results: Recent Labs    01/09/19 1759 01/10/19 0534 01/11/19 0247  WBC 5.3 5.5 5.5  HGB 13.7 10.7* 11.4*  HCT 41.0 31.3* 33.5*  PLT 265 230 255   BMET Recent Labs    01/09/19 1759 01/10/19 0534 01/11/19 0247  NA 140 143 143  K 4.0 3.1* 3.6  CL 107 117* 111  CO2 17* 18* 20*  GLUCOSE 127* 134* 105*  BUN 75* 61* 48*  CREATININE 4.11* 3.11* 2.85*  CALCIUM 9.4 7.6* 8.5*   LFT Recent Labs    01/09/19 1759  PROT 7.8  ALBUMIN 3.3*  AST 26  ALT 45*  ALKPHOS 85  BILITOT 0.5   PT/INR No results for input(s): LABPROT, INR in the last 72 hours.  Studies/Results: Ct Abdomen Pelvis Wo Contrast  Result Date: 01/09/2019 CLINICAL DATA:  Upper abdominal pain with shortness of breath for several days EXAM: CT ABDOMEN AND PELVIS WITHOUT CONTRAST TECHNIQUE: Multidetector CT imaging of the abdomen and pelvis was performed following the standard protocol without IV contrast. COMPARISON:  10/03/2014 FINDINGS: Lower chest: Mild patchy infiltrate is noted in the bases bilaterally. No focal confluent infiltrate or sizable effusion is noted. Hepatobiliary: No focal liver abnormality is seen. Status post cholecystectomy. No biliary dilatation. Pancreas: Unremarkable. No pancreatic ductal dilatation or surrounding inflammatory changes. Spleen: Normal in size without focal abnormality. Adrenals/Urinary Tract:  Adrenal glands are within normal limits. Kidneys demonstrate no renal calculi or urinary tract obstructive changes. Vague hypodensities are noted within the left kidney consistent with cysts stable from the prior exam. No obstructive changes are seen. The bladder is decompressed. Stomach/Bowel: Scattered diverticular change of the colon is noted without evidence of diverticulitis. The appendix is within normal limits. No small bowel abnormality is seen. The stomach is unremarkable with the exception of a small sliding-type hiatal hernia. Vascular/Lymphatic: Aortic atherosclerosis. No enlarged abdominal or pelvic lymph nodes. Reproductive: Changes consistent with prior prostatectomy. Other: No abdominal wall hernia or abnormality. No abdominopelvic ascites. Musculoskeletal: Mild degenerative changes of lumbar spine are noted. Old rib fractures are noted with healing on the left. IMPRESSION: Patchy bibasilar infiltrate without sizable effusion. This likely represents atypical pneumonia. Diverticulosis without diverticulitis. Chronic changes as described above. Electronically Signed   By: Inez Catalina M.D.   On: 01/09/2019 22:52   Dg Chest Port 1 View  Result Date: 01/09/2019 CLINICAL DATA:  Chest pain for the past 3 days. EXAM: PORTABLE CHEST 1 VIEW COMPARISON:  CT chest and chest x-ray dated September 08, 2018. FINDINGS: The heart size and mediastinal contours are within normal limits. Normal pulmonary vascularity.  No focal consolidation, pleural effusion, or pneumothorax. IMPRESSION: No active disease. Electronically Signed   By: Titus Dubin M.D.   On: 01/09/2019 18:47    Impression: The patient's symptoms do seem to point to the esophagus.  I wonder about some sort of infectious esophagitis, such as herpetic esophagitis, to account for the intense pain.  Doubt dissecting aneurysm of the thoracic aorta given the marked exacerbation of symptoms with swallowing.  Plan: Upper endoscopy tomorrow morning,  stopping heparin 6 hours prior to his exam.  Petra Kuba, purpose, risks reviewed.  Thereafter, I anticipate doing a barium swallow to check for anatomic and motility features of the esophagus that might not be evident endoscopically.  I will stop the patient sucralfate for now, since it may coat the esophagus and/or stomach and impair mucosal assessment.  In the meantime, agree with empiric PPI therapy although I doubt this is a manifestation of acid reflux disease.  Also agree with analgesic therapy such as morphine to try to take the edge off his discomfort.   LOS: 2 days   Youlanda Mighty Terriyah Westra  01/11/2019, 4:23 PM   Pager [phone] If no answer or after 5 PM call [phone]

## 2019-01-11 NOTE — Progress Notes (Signed)
ANTICOAGULATION CONSULT NOTE - Follow Up Consult  Pharmacy Consult for heparin Indication: h/o Afib/VTE  Labs: Recent Labs    01/09/19 1759 01/09/19 2354  01/10/19 0534 01/10/19 0923 01/10/19 1358 01/10/19 2101 01/11/19 0247  HGB 13.7  --   --  10.7*  --   --   --   --   HCT 41.0  --   --  31.3*  --   --   --   --   PLT 265  --   --  230  --   --   --   --   HEPARINUNFRC  --   --    < > 0.17*  --  0.54 0.74* 0.17*  CREATININE 4.11*  --   --  3.11*  --   --   --   --   TROPONINI 0.03* 0.04*  --  0.03* 0.03*  --   --   --    < > = values in this interval not displayed.    Assessment: 62yo male supratherapeutic on heparin after rate decrease; RN reports moving heparin from one IV line to another; no gtt issues or signs of bleeding per RN.  Goal of Therapy:  Heparin level 0.3-0.7 units/ml   Plan:  Will increase heparin gtt by 2 units/kg/hr to 1700 units/hr and check level in 8 hours.    Wynona Neat, PharmD, BCPS  01/11/2019,5:30 AM

## 2019-01-11 NOTE — Progress Notes (Signed)
ANTICOAGULATION CONSULT NOTE - Follow Up Consult  Pharmacy Consult for heparin Indication: h/o Afib/VTE  Labs: Recent Labs    01/09/19 1759 01/09/19 2354 01/10/19 0534 01/10/19 0923  01/11/19 0247 01/11/19 1434 01/11/19 2058  HGB 13.7  --  10.7*  --   --  11.4*  --   --   HCT 41.0  --  31.3*  --   --  33.5*  --   --   PLT 265  --  230  --   --  255  --   --   HEPARINUNFRC  --   --  0.17*  --    < > 0.17* 0.59 0.74*  CREATININE 4.11*  --  3.11*  --   --  2.85*  --   --   TROPONINI 0.03* 0.04* 0.03* 0.03*  --   --   --   --    < > = values in this interval not displayed.    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  Startied on IV heparin for Afib and h/o VTEs.  Reported  noncompliant with PTA Eliquis.    Heparin level this evening is SUPRAtherapeutic (HL 0.74 << 0.59, goal of 0.3-0.7). No issues or bleeding noted per RN. Drip to turn off at 0200 on 5/29 for procedure - stop time added.   Goal of Therapy:  Heparin level 0.3-0.7 units/ml   Plan:  - Reduce Heparin to 1600 units/hr (16 ml/hr) - Hep to turn off on 5/29 at 0200 - Will follow-up restart anticoagulation plans post-op  Thank you for allowing pharmacy to be a part of this patient's care.  Alycia Rossetti, PharmD, BCPS Clinical Pharmacist Clinical phone for 01/11/2019: [phone] 01/11/2019 9:56 PM   **Pharmacist phone directory can now be found on [email or web address] ([email or web address]).  Listed under [place].

## 2019-01-11 NOTE — Progress Notes (Signed)
ANTICOAGULATION CONSULT NOTE - Follow Up Consult  Pharmacy Consult for heparin Indication: h/o Afib/VTE  Labs: Recent Labs    01/09/19 1759 01/09/19 2354 01/10/19 0534 01/10/19 0923  01/10/19 2101 01/11/19 0247 01/11/19 1434  HGB 13.7  --  10.7*  --   --   --  11.4*  --   HCT 41.0  --  31.3*  --   --   --  33.5*  --   PLT 265  --  230  --   --   --  255  --   HEPARINUNFRC  --   --  0.17*  --    < > 0.74* 0.17* 0.59  CREATININE 4.11*  --  3.11*  --   --   --  2.85*  --   TROPONINI 0.03* 0.04* 0.03* 0.03*  --   --   --   --    < > = values in this interval not displayed.    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  Startied on IV heparin for Afib and h/o VTEs.  Reported  noncompliant with PTA Eliquis.   Heparin level  is therapeutic at 0.59 after heparin increased to 1700 units/hr this morning.  No bleeding reported.  Goal of Therapy:  Heparin level 0.3-0.7 units/ml   Plan:  Continue heparin infusion at 1700 units/hr and check level in 6 hours to confirm remains therapeutic.   Daily HL, CBC Monitor for signs and symptoms of bleeding.  Nicole Cella, RPh Clinical Pharmacist Pager: [phone] Please check AMION for all [place] phone numbers After 10:00 PM, call [place] [phone] 01/11/2019,3:33 PM

## 2019-01-11 NOTE — Progress Notes (Signed)
PROGRESS NOTE    Todd [name].  VOZ:[id number] DOB: 04/06/1956 DOA: 01/09/2019 PCP: [name], MD     Brief Narrative:   63 y.o. 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  Patient is not currently taking eliquis "though I should be".  Patient presents to the ED with c/o central to low CP / epigastric pain that is sharp and stabbing.  Worse with coughing and deep breaths and moving. Onset about 3 days ago.  Minor amount of cough.  Pain worsening especially with swallowing, poor PO intake over past couple of days.  ED Course: AKF with creat 4.1 and BUN 7.5 (1.0 and 18 in Jan this year).  EtOH level is <10.  UA with small blood otherwise neg.  Bladder scan only shows 28 ml.  CXR neg, COVID neg.  Tm 99.  Trop 0.03.   Assessment & Plan: 1-Acute kidney failure ([place]) -Continue minimizing Nephrotoxic agents -Continue IV rehydration -Continue to follow I's and O's. -Cr Function improving with IV fluids.  2-DM (diabetes mellitus), type 2 ([place]) -Avoid oral hypoglycemic agents while inpatient -Continue sliding scale insulin -A1c 6.4  3-Essential hypertension -Blood pressure overall stable -Continue PRN hydralazine.  4-CHEST PAIN -Atypical -Continue treatment for noncardiac causes including treatment for gastroesophageal reflux disease/esophagitis and pneumonia. -Will continue the use of PPI, Carafate and as needed GI cocktail. -GI service has been consulted for evaluation and decision on the need of endoscopic procedures. -Continue IV antibiotics. -Continue PRN pain medications  5-Alcohol use disorder, severe, dependence ([place]) -will continue CIWA protocol -continue thiamine and folic acid -cessation counseling provided.  6-Opioid use disorder, moderate, dependence ([place]) -continue current dose of narcotics -avoid overuse.  7-BPH -continue flomax  8-depression/anxiety -No suicidal ideation  or hallucination -continue trazodone and Zoloft.  DVT prophylaxis: Heparin drip Code Status: Full code Family Communication: No family at bedside Disposition Plan: Continue IV antibiotics for concerns of pneumonia seen on CT scan, troponin with flat elevation and no abnormalities seen on telemetry or EKGs.  Continue Protonix, Carafate and use as needed GI cocktail. Continue heparin for now.  Consultants:   GI service  Procedures:   See below for x-ray reports.   Antimicrobials:  Anti-infectives (From admission, onward)   Start     Dose/Rate Route Frequency Ordered Stop   01/10/19 1800  azithromycin (ZITHROMAX) tablet 250 mg     250 mg Oral Daily 01/09/19 2325 01/14/19 1759   01/10/19 0800  cefTRIAXone (ROCEPHIN) 1 g in sodium chloride 0.9 % 100 mL IVPB     1 g 200 mL/hr over 30 Minutes Intravenous Daily 01/10/19 0722     01/10/19 0000  azithromycin (ZITHROMAX) tablet 500 mg     500 mg Oral Daily 01/09/19 2325 01/10/19 0100      Subjective: No fever, no shortness of breath.  Patient reports no nausea currently.  Still struggling with epigastric discomfort and odynophagia.  Objective: Vitals:   01/11/19 0528 01/11/19 0845 01/11/19 1015 01/11/19 1143  BP:   (!) 188/97 (!) 170/83  Pulse:    65  Resp:    15  Temp: 98.7 F (37.1 C) 98.1 F (36.7 C)    TempSrc: Oral Oral    SpO2:    94%  Weight:      Height:        Intake/Output Summary (Last 24 hours) at 01/11/2019 1343 Last data filed at 01/11/2019 0848 Gross per 24 hour  Intake -  Output 925 ml  Net -925 ml   Filed Weights   01/09/19 1756 01/10/19 0000  Weight: 102.1 kg 103.3 kg    Examination: General exam: Alert, awake, oriented x 3; no shortness of breath, no diaphoresis.  Patient continue complaining of epigastric discomfort and also odynophagia.  Expressed having difficulty taking his medications by mouth, and even expressed sensation of something is stuck in the middle of his chest. Respiratory system:  Clear to auscultation. Respiratory effort normal. Cardiovascular system:RRR. No murmurs, rubs, gallops. Gastrointestinal system: Abdomen is nondistended, soft and nontender. No organomegaly or masses felt. Normal bowel sounds heard. Central nervous system: Alert and oriented. No focal neurological deficits. Extremities: No cyanosis or clubbing; trace edema appreciated bilaterally; no pain to palpation in lower extremities. Skin: No rashes, lesions or ulcers Psychiatry: Judgement and insight appear normal. Mood & affect appropriate.    Data Reviewed: I have personally reviewed following labs and imaging studies  CBC: Recent Labs  Lab 01/09/19 1759 01/10/19 0534 01/11/19 0247  WBC 5.3 5.5 5.5  NEUTROABS 3.0  --   --   HGB 13.7 10.7* 11.4*  HCT 41.0 31.3* 33.5*  MCV 95.8 94.8 94.6  PLT 265 230 413   Basic Metabolic Panel: Recent Labs  Lab 01/09/19 1759 01/10/19 0534 01/11/19 0247  NA 140 143 143  K 4.0 3.1* 3.6  CL 107 117* 111  CO2 17* 18* 20*  GLUCOSE 127* 134* 105*  BUN 75* 61* 48*  CREATININE 4.11* 3.11* 2.85*  CALCIUM 9.4 7.6* 8.5*  MG  --   --  1.6*   GFR: Estimated Creatinine Clearance: 31.8 mL/min (A) (by C-G formula based on SCr of 2.85 mg/dL (H)).   Liver Function Tests: Recent Labs  Lab 01/09/19 1759  AST 26  ALT 45*  ALKPHOS 85  BILITOT 0.5  PROT 7.8  ALBUMIN 3.3*   Recent Labs  Lab 01/09/19 1759  LIPASE 49   Cardiac Enzymes: Recent Labs  Lab 01/09/19 1759 01/09/19 2354 01/10/19 0534 01/10/19 0923  TROPONINI 0.03* 0.04* 0.03* 0.03*   CBG: Recent Labs  Lab 01/10/19 2027 01/11/19 0106 01/11/19 0535 01/11/19 0836 01/11/19 1303  GLUCAP 118* 108* 122* 128* 125*   Urine analysis:    Component Value Date/Time   COLORURINE YELLOW 01/09/2019 2100   APPEARANCEUR HAZY (A) 01/09/2019 2100   LABSPEC 1.012 01/09/2019 2100   PHURINE 5.0 01/09/2019 2100   GLUCOSEU NEGATIVE 01/09/2019 2100   HGBUR SMALL (A) 01/09/2019 2100   BILIRUBINUR  NEGATIVE 01/09/2019 2100   KETONESUR NEGATIVE 01/09/2019 2100   PROTEINUR NEGATIVE 01/09/2019 2100   UROBILINOGEN 0.2 12/24/2009 1944   NITRITE NEGATIVE 01/09/2019 2100   LEUKOCYTESUR NEGATIVE 01/09/2019 2100    Recent Results (from the past 240 hour(s))  SARS Coronavirus 2 (CEPHEID- Performed in [facility] hospital lab), Hosp Order     Status: None   Collection Time: 01/09/19  6:26 PM  Result Value Ref Range Status   SARS Coronavirus 2 NEGATIVE NEGATIVE Final    Comment: (NOTE) If result is NEGATIVE SARS-CoV-2 target nucleic acids are NOT DETECTED. The SARS-CoV-2 RNA is generally detectable in upper and lower  respiratory specimens during the acute phase of infection. The lowest  concentration of SARS-CoV-2 viral copies this assay can detect is 250  copies / mL. A negative result does not preclude SARS-CoV-2 infection  and should not be used as the sole basis for treatment or other  patient management decisions.  A negative result may occur with  improper  specimen collection / handling, submission of specimen other  than nasopharyngeal swab, presence of viral mutation(s) within the  areas targeted by this assay, and inadequate number of viral copies  (<250 copies / mL). A negative result must be combined with clinical  observations, patient history, and epidemiological information. If result is POSITIVE SARS-CoV-2 target nucleic acids are DETECTED. The SARS-CoV-2 RNA is generally detectable in upper and lower  respiratory specimens dur ing the acute phase of infection.  Positive  results are indicative of active infection with SARS-CoV-2.  Clinical  correlation with patient history and other diagnostic information is  necessary to determine patient infection status.  Positive results do  not rule out bacterial infection or co-infection with other viruses. If result is PRESUMPTIVE POSTIVE SARS-CoV-2 nucleic acids MAY BE PRESENT.   A presumptive positive result was obtained on  the submitted specimen  and confirmed on repeat testing.  While 2019 novel coronavirus  (SARS-CoV-2) nucleic acids may be present in the submitted sample  additional confirmatory testing may be necessary for epidemiological  and / or clinical management purposes  to differentiate between  SARS-CoV-2 and other Sarbecovirus currently known to infect humans.  If clinically indicated additional testing with an alternate test  methodology [phone]) is advised. The SARS-CoV-2 RNA is generally  detectable in upper and lower respiratory sp ecimens during the acute  phase of infection. The expected result is Negative. Fact Sheet for Patients:  StrictlyIdeas.no Fact Sheet for Healthcare Providers: BankingDealers.co.za This test is not yet approved or cleared by the [place] FDA and has been authorized for detection and/or diagnosis of SARS-CoV-2 by FDA under an Emergency Use Authorization (EUA).  This EUA will remain in effect (meaning this test can be used) for the duration of the COVID-19 declaration under Section 564(b)(1) of the Act, 21 U.S.C. section 360bbb-3(b)(1), unless the authorization is terminated or revoked sooner. Performed at [facility], [place] [address] [place], [place] [address]   Urine culture     Status: Abnormal   Collection Time: 01/09/19  9:31 PM  Result Value Ref Range Status   Specimen Description URINE, RANDOM  Final   Special Requests NONE  Final   Culture (A)  Final    <10,000 COLONIES/mL INSIGNIFICANT GROWTH Performed at [facility], [place] [address], [place], [place] [address]    Report Status 01/10/2019 FINAL  Final     Radiology Studies: Ct Abdomen Pelvis Wo Contrast  Result Date: 01/09/2019 CLINICAL DATA:  Upper abdominal pain with shortness of breath for several days EXAM: CT ABDOMEN AND PELVIS WITHOUT CONTRAST TECHNIQUE: Multidetector CT imaging of the abdomen and pelvis was performed  following the standard protocol without IV contrast. COMPARISON:  10/03/2014 FINDINGS: Lower chest: Mild patchy infiltrate is noted in the bases bilaterally. No focal confluent infiltrate or sizable effusion is noted. Hepatobiliary: No focal liver abnormality is seen. Status post cholecystectomy. No biliary dilatation. Pancreas: Unremarkable. No pancreatic ductal dilatation or surrounding inflammatory changes. Spleen: Normal in size without focal abnormality. Adrenals/Urinary Tract: Adrenal glands are within normal limits. Kidneys demonstrate no renal calculi or urinary tract obstructive changes. Vague hypodensities are noted within the left kidney consistent with cysts stable from the prior exam. No obstructive changes are seen. The bladder is decompressed. Stomach/Bowel: Scattered diverticular change of the colon is noted without evidence of diverticulitis. The appendix is within normal limits. No small bowel abnormality is seen. The stomach is unremarkable with the exception of a small sliding-type hiatal hernia. Vascular/Lymphatic:  Aortic atherosclerosis. No enlarged abdominal or pelvic lymph nodes. Reproductive: Changes consistent with prior prostatectomy. Other: No abdominal wall hernia or abnormality. No abdominopelvic ascites. Musculoskeletal: Mild degenerative changes of lumbar spine are noted. Old rib fractures are noted with healing on the left. IMPRESSION: Patchy bibasilar infiltrate without sizable effusion. This likely represents atypical pneumonia. Diverticulosis without diverticulitis. Chronic changes as described above. Electronically Signed   By: Inez Catalina M.D.   On: 01/09/2019 22:52   Dg Chest Port 1 View  Result Date: 01/09/2019 CLINICAL DATA:  Chest pain for the past 3 days. EXAM: PORTABLE CHEST 1 VIEW COMPARISON:  CT chest and chest x-ray dated September 08, 2018. FINDINGS: The heart size and mediastinal contours are within normal limits. Normal pulmonary vascularity. No focal  consolidation, pleural effusion, or pneumothorax. IMPRESSION: No active disease. Electronically Signed   By: Titus Dubin M.D.   On: 01/09/2019 18:47    Scheduled Meds: . atorvastatin  10 mg Oral q1800  . azithromycin  250 mg Oral Daily  . folic acid  1 mg Oral Daily  . insulin aspart  0-9 Units Subcutaneous Q4H  . multivitamin with minerals  1 tablet Oral Daily  . pantoprazole  40 mg Oral BID  . sertraline  100 mg Oral q morning - 10a  . sucralfate  1 g Oral BID  . tamsulosin  0.4 mg Oral QPM  . thiamine  100 mg Oral Daily   Or  . thiamine  100 mg Intravenous Daily  . traZODone  200 mg Oral QHS   Continuous Infusions: . sodium chloride 125 mL/hr at 01/10/19 1713  . cefTRIAXone (ROCEPHIN)  IV 1 g (01/11/19 0853)  . heparin 1,700 Units/hr (01/11/19 0852)     LOS: 2 days    Time spent: 35 minutes.  Greater than 50% of this time has been spent in direct contact with the patient, coordinating care and discussing relevant ongoing clinical issues, including ongoing midepigastric discomfort and odynophagia; anticipated treatment intervention and consultation with gastroenterology service.  I contacted a specialist, discussed patient's case and will be anticipated further decision/recommendations on treatment.  Barton Dubois, MD [facility] Pager [phone]  01/11/2019, 1:43 PM

## 2019-01-12 ENCOUNTER — Encounter (HOSPITAL_COMMUNITY): Payer: Self-pay | Admitting: *Deleted

## 2019-01-12 ENCOUNTER — Encounter (HOSPITAL_COMMUNITY)
Admission: EM | Discharge status: Against Medical Advice | Payer: Self-pay | Source: Home / Self Care | Attending: Internal Medicine

## 2019-01-12 ENCOUNTER — Inpatient Hospital Stay (HOSPITAL_COMMUNITY): Payer: Medicare Other | Admitting: Certified Registered Nurse Anesthetist

## 2019-01-12 ENCOUNTER — Inpatient Hospital Stay (HOSPITAL_COMMUNITY): Payer: Medicare Other

## 2019-01-12 HISTORY — PX: ESOPHAGOGASTRODUODENOSCOPY (EGD) WITH PROPOFOL: SHX5813

## 2019-01-12 HISTORY — PX: BIOPSY: SHX5522

## 2019-01-12 LAB — CBC
HCT: 34.6 % — ABNORMAL LOW (ref 39.0–52.0)
Hemoglobin: 11.8 g/dL — ABNORMAL LOW (ref 13.0–17.0)
MCH: 32.7 pg (ref 26.0–34.0)
MCHC: 34.1 g/dL (ref 30.0–36.0)
MCV: 95.8 fL (ref 80.0–100.0)
Platelets: 235 10*3/uL (ref 150–400)
RBC: 3.61 MIL/uL — ABNORMAL LOW (ref 4.22–5.81)
RDW: 14.2 % (ref 11.5–15.5)
WBC: 5.4 10*3/uL (ref 4.0–10.5)
nRBC: 0 % (ref 0.0–0.2)

## 2019-01-12 LAB — GLUCOSE, CAPILLARY
Glucose-Capillary: 97 mg/dL (ref 70–99)
Glucose-Capillary: 117 mg/dL — ABNORMAL HIGH (ref 70–99)
Glucose-Capillary: 192 mg/dL — ABNORMAL HIGH (ref 70–99)
Glucose-Capillary: 118 mg/dL — ABNORMAL HIGH (ref 70–99)
Glucose-Capillary: 117 mg/dL — ABNORMAL HIGH (ref 70–99)

## 2019-01-12 LAB — HEPARIN LEVEL (UNFRACTIONATED): Heparin Unfractionated: 0.39 IU/mL (ref 0.30–0.70)

## 2019-01-12 SURGERY — ESOPHAGOGASTRODUODENOSCOPY (EGD) WITH PROPOFOL
Anesthesia: Monitor Anesthesia Care

## 2019-01-12 MED ORDER — HYDRALAZINE HCL 20 MG/ML IJ SOLN
5.0000 mg | INTRAMUSCULAR | Status: DC | PRN
  Administered 2019-01-12 – 2019-01-17 (×11): 5 mg via INTRAVENOUS
  Filled 2019-01-12 (×9): qty 1

## 2019-01-12 MED ORDER — HEPARIN (PORCINE) 25000 UT/250ML-% IV SOLN
1600.0000 [IU]/h | INTRAVENOUS | Status: AC
  Administered 2019-01-12: 13:00:00 1600 [IU]/h via INTRAVENOUS
  Filled 2019-01-12: qty 250

## 2019-01-12 MED ORDER — PROPOFOL 500 MG/50ML IV EMUL
INTRAVENOUS | Status: DC | PRN
  Administered 2019-01-12: 125 ug/kg/min via INTRAVENOUS

## 2019-01-12 MED ORDER — PROPOFOL 10 MG/ML IV BOLUS
INTRAVENOUS | Status: DC | PRN
  Administered 2019-01-12: 20 mg via INTRAVENOUS

## 2019-01-12 MED ORDER — LIDOCAINE 2% (20 MG/ML) 5 ML SYRINGE
INTRAMUSCULAR | Status: DC | PRN
  Administered 2019-01-12: 60 mg via INTRAVENOUS

## 2019-01-12 MED ORDER — HYDROMORPHONE HCL 1 MG/ML IJ SOLN
1.0000 mg | INTRAMUSCULAR | Status: AC | PRN
  Administered 2019-01-13 (×3): 1 mg via INTRAVENOUS
  Filled 2019-01-12 (×4): qty 1

## 2019-01-12 MED ORDER — SODIUM CHLORIDE 0.9 % IV SOLN
8.0000 mg/h | INTRAVENOUS | Status: AC
  Administered 2019-01-12 – 2019-01-15 (×7): 8 mg/h via INTRAVENOUS
  Filled 2019-01-12 (×10): qty 80

## 2019-01-12 MED ORDER — SODIUM CHLORIDE 0.9 % IV SOLN
INTRAVENOUS | Status: DC
  Administered 2019-01-12: 08:00:00 via INTRAVENOUS

## 2019-01-12 MED ORDER — METOPROLOL TARTRATE 5 MG/5ML IV SOLN
5.0000 mg | Freq: Three times a day (TID) | INTRAVENOUS | Status: DC | PRN
  Administered 2019-01-12: 18:00:00 5 mg via INTRAVENOUS
  Filled 2019-01-12: qty 5

## 2019-01-12 MED ORDER — SODIUM CHLORIDE 0.9 % IV SOLN
80.0000 mg | Freq: Once | INTRAVENOUS | Status: AC
  Administered 2019-01-12: 80 mg via INTRAVENOUS
  Filled 2019-01-12: qty 80

## 2019-01-12 MED ORDER — HYDRALAZINE HCL 20 MG/ML IJ SOLN
INTRAMUSCULAR | Status: AC
  Filled 2019-01-12: qty 1

## 2019-01-12 MED ORDER — PANTOPRAZOLE SODIUM 40 MG IV SOLR
40.0000 mg | Freq: Two times a day (BID) | INTRAVENOUS | Status: DC
  Administered 2019-01-15 – 2019-01-18 (×6): 40 mg via INTRAVENOUS
  Filled 2019-01-12 (×6): qty 40

## 2019-01-12 MED ORDER — HYDROMORPHONE HCL 1 MG/ML IJ SOLN
2.0000 mg | Freq: Once | INTRAMUSCULAR | Status: AC
  Administered 2019-01-12: 19:00:00 2 mg via INTRAVENOUS
  Filled 2019-01-12: qty 2

## 2019-01-12 SURGICAL SUPPLY — 15 items

## 2019-01-12 NOTE — Progress Notes (Signed)
ANTICOAGULATION CONSULT NOTE - Follow Up Consult  Pharmacy Consult for heparin Indication: h/o Afib/VTE  Labs: Recent Labs    01/09/19 2354  01/10/19 0534 01/10/19 0923  01/11/19 0247 01/11/19 1434 01/11/19 2058 01/12/19 0310 01/12/19 1936  HGB  --    < > 10.7*  --   --  11.4*  --   --  11.8*  --   HCT  --   --  31.3*  --   --  33.5*  --   --  34.6*  --   PLT  --   --  230  --   --  255  --   --  235  --   HEPARINUNFRC  --   --  0.17*  --    < > 0.17* 0.59 0.74*  --  0.39  CREATININE  --   --  3.11*  --   --  2.85*  --   --   --   --   TROPONINI 0.04*  --  0.03* 0.03*  --   --   --   --   --   --    < > = values in this interval not displayed.    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  Startied on IV heparin for Afib and h/o VTEs.  Reported  noncompliant with PTA Eliquis.    Heparin off 0200 for endoscopy,  s/p endoscopy ,GI ok'd  to restart hep gtt at 12noon today.  Endoscopy found severe distal esophagitis, does not look infectious.  GI starting IV PPI (protonix) bolus, infusion then  on 6/1 switch to IV 40mg  q12h.  Heparin level 0.39 (therapeutic)   Goal of Therapy:  Heparin level 0.3-0.7 units/ml   Plan:  - Continue Heparin at 1600 units/hr (16 ml/hr) - check 6 hr heparin level -daily HL, CBC -f/u when to restart oral anticoagulation  (h/o noncompliance pta noted)  Chrishun Scheer A. Levada Dy, PharmD, [place] Pager: [phone] Please utilize Amion for appropriate phone number to reach the unit pharmacist ([place])

## 2019-01-12 NOTE — Progress Notes (Signed)
Pt's showing SVT with HR sustaining in the 170's. Pt given 5mg  Metoprolol IV. HR elevated in 150s. Will continue to monitor.

## 2019-01-12 NOTE — Addendum Note (Signed)
Addendum  created 01/12/19 1010 by Glynda Jaeger, CRNA   Charge Capture section accepted, Visit diagnoses modified

## 2019-01-12 NOTE — Transfer of Care (Signed)
Immediate Anesthesia Transfer of Care Note  Patient: Todd [name].  Procedure(s) Performed: ESOPHAGOGASTRODUODENOSCOPY (EGD) WITH PROPOFOL (N/A )  Patient Location: Endoscopy Unit  Anesthesia Type:[name]  Level of Consciousness: awake, alert , oriented, patient cooperative and responds to stimulation  Airway & Oxygen Therapy: Patient Spontanous Breathing and Patient connected to nasal cannula oxygen  Post-op Assessment: Report given to RN, Post -op Vital signs reviewed and stable and Patient moving all extremities X 4  Post vital signs: Reviewed and stable  Last Vitals:  Vitals Value Taken Time  BP 183/93 01/12/2019  8:40 AM  Temp 36.8 C 01/12/2019  8:40 AM  Pulse 75 01/12/2019  8:40 AM  Resp 21 01/12/2019  8:40 AM  SpO2 97 % 01/12/2019  8:40 AM  Vitals shown include unvalidated device data.  Last Pain:  Vitals:   01/12/19 0840  TempSrc: Axillary  PainSc: 5       Patients Stated Pain Goal: 4 (99/23/41 4436)  Complications: No apparent anesthesia complications

## 2019-01-12 NOTE — Anesthesia Postprocedure Evaluation (Signed)
Anesthesia Post Note  Patient: Kutter Schnepf.  Procedure(s) Performed: ESOPHAGOGASTRODUODENOSCOPY (EGD) WITH PROPOFOL (N/A ) BIOPSY     Patient location during evaluation: Endoscopy Anesthesia Type: [name] Level of consciousness: awake and alert Pain management: pain level controlled Vital Signs Assessment: post-procedure vital signs reviewed and stable Respiratory status: spontaneous breathing, nonlabored ventilation and respiratory function stable Cardiovascular status: stable and blood pressure returned to baseline Postop Assessment: no apparent nausea or vomiting Anesthetic complications: no    Last Vitals:  Vitals:   01/12/19 0850 01/12/19 0900  BP: (!) 204/95 (!) 214/95  Pulse: 72 70  Resp: 18 16  Temp:    SpO2: 98% 96%    Last Pain:  Vitals:   01/12/19 0900  TempSrc:   PainSc: [place]

## 2019-01-12 NOTE — Significant Event (Signed)
Rapid Response Event Note  Overview: Cardiac - HR in the 170s.  Initial Focused Assessment: Called by nurses about patient's HR being in the 150-170s - in SVT. RN had paged MD and orders were given for Lopressor 5 mg IV x 1, which was given prior to my arrival. When I arrived, HR was in the 170s, patient was c/o of severe epigastric pain and was short of breath within less than minute, HR was back in the 70s, his breathing was better. This happened one more time and seems to be related to the epigastric pain that he is experiencing, RN already administered Morphine 4 mg IV at 1800. SBP in the 170s prior to Lopressor now in the 110-115 range, 95% on RA. This seems to be related to pain and patient is mildly anxious as well. I paged MD on call, we discussed the patient's condition, MD ordered pain medication. Not in acute distress. GI work up: severe distal esophagitis  Interventions: -- EKG   Plan of Care: -- RN to administer ordered pain medication and reassess pain  -- Monitor VS and mental status since IV pain medications are being around the clock.   Event Summary:  Call Time [place], [place]

## 2019-01-12 NOTE — Interval H&P Note (Signed)
History and Physical Interval Note:  01/12/2019 8:04 AM  Todd [name].  has presented today for surgery, with the diagnosis of Chest pain and dysphagia.  The various methods of treatment have been discussed with the patient. After consideration of risks, benefits and other options for treatment, the patient has consented to upper endoscopy.  The patient's history has been reviewed, patient examined, no change in status, stable for surgery.  I have reviewed the patient's chart and labs.  Questions were answered to the patient's satisfaction.     Todd [name]

## 2019-01-12 NOTE — Progress Notes (Addendum)
Patient's endoscopy shows severe distal esophagitis, characterized by exudate and erythema.  The appearance is nonspecific, but it does not look infectious to me.  The diffuse character brings to mind the possibility of the aftermath of some severe acid reflux, or perhaps a caustic ingestion.    Biopsies may help clarify the picture, but in the meantime, I would continue supportive care with pain medication and intensive antireflux medication (for which reason I have switched him from oral therapy to a continuous pantoprazole infusion).    Okay to resume anticoagulation after a few hours (heparin infusion ordered).  I have also ordered a full liquid diet, although I doubt he will be able to tolerate very much oral intake for several days.  Based on the endoscopic appearance, I would anticipate that this patient will probably see gradual resolution of his intense symptoms over the next 5 to 7 days.  He may need to be in the hospital much of that time, the endpoint for hospitalization being when he can take adequate oral nutrition and has adequate symptom control.  I have canceled the patient's barium swallow because I think the endoscopic findings explain the patient's symptoms, and I do not think the barium swallow would add much at this time.  We will follow the patient at a distance, so do not hesitate to call us at any time if earlier input is needed.  Cleotis Nipper, M.D. Pager [phone] If no answer or after 5 PM call [phone]

## 2019-01-12 NOTE — Progress Notes (Signed)
Pt's HR sustaining in 160s with PVCs; Pt performed vagal maneuver and HR came down to 70s; Paged MD; Awaiting order; will continue to monitor.

## 2019-01-12 NOTE — Progress Notes (Signed)
ANTICOAGULATION CONSULT NOTE - Follow Up Consult  Pharmacy Consult for heparin Indication: h/o Afib/VTE  Labs: Recent Labs    01/09/19 1759 01/09/19 2354 01/10/19 0534 01/10/19 0923  01/11/19 0247 01/11/19 1434 01/11/19 2058 01/12/19 0310  HGB 13.7  --  10.7*  --   --  11.4*  --   --  11.8*  HCT 41.0  --  31.3*  --   --  33.5*  --   --  34.6*  PLT 265  --  230  --   --  255  --   --  235  HEPARINUNFRC  --   --  0.17*  --    < > 0.17* 0.59 0.74*  --   CREATININE 4.11*  --  3.11*  --   --  2.85*  --   --   --   TROPONINI 0.03* 0.04* 0.03* 0.03*  --   --   --   --   --    < > = values in this interval not displayed.    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  Startied on IV heparin for Afib and h/o VTEs.  Reported  noncompliant with PTA Eliquis.    Heparin off 0200 for endoscopy,  s/p endoscopy ,GI ok'd  to restart hep gtt at 12noon today.  Endoscopy found severe distal esophagitis, does not look infectious.  GI starting IV PPI (protonix) bolus, infusion then  on 6/1 switch to IV 40mg  q12h.   Goal of Therapy:  Heparin level 0.3-0.7 units/ml   Plan:  - At 12:00 noon today, resume Heparin at 1600 units/hr (16 ml/hr) - check 8 hr heparin level -daily HL, CBC -f/u when to restart oral anticoagulation  (h/o noncompliance pta noted)  Thank you for allowing pharmacy to be a part of this patient's care.  Nicole Cella, RPh Clinical Pharmacist Clinical phone for 01/12/2019: [phone] 01/12/2019 10:01 AM   **Pharmacist phone directory can now be found on [email or web address] ([email or web address]).  Listed under [place].

## 2019-01-12 NOTE — Op Note (Signed)
[facility] Patient Name: Todd [name] Procedure Date : 01/12/2019 MRN: [id number] Attending MD: Ronald Lobo , MD Date of Birth: 1956/02/02 CSN: [id number] Age: 63 Admit Type: Inpatient Procedure:                Upper GI endoscopy Indications:              Dysphagia, Odynophagia, Chest pain (non cardiac) of                            approximately 1 week's duration Providers:                Ronald Lobo, MD, Glori Bickers, RN, Marguerita Merles, Technician, Charolette Child, Technician Referring MD:              Medicines:                Monitored Anesthesia Care Complications:            No immediate complications. Estimated Blood Loss:     Estimated blood loss was minimal. Procedure:                Pre-Anesthesia Assessment:                           - Prior to the procedure, a History and Physical                            was performed, and patient medications and                            allergies were reviewed. The patient's tolerance of                            previous anesthesia was also reviewed. The risks                            and benefits of the procedure and the sedation                            options and risks were discussed with the patient.                            All questions were answered, and informed consent                            was obtained. Prior Anticoagulants: The patient has                            taken heparin, last dose was 1 day prior to                            procedure. ASA Grade Assessment: III - A patient  with severe systemic disease. After reviewing the                            risks and benefits, the patient was deemed in                            satisfactory condition to undergo the procedure.                           After obtaining informed consent, the endoscope was                            passed under direct vision. Throughout the                     procedure, the patient's blood pressure, pulse, and                            oxygen saturations were monitored continuously. The                            GIF-H190 ([id number]) Olympus gastroscope was                            introduced through the mouth, and advanced to the                            second part of duodenum. The upper GI endoscopy was                            somewhat difficult due to inadequate patient                            positioning and the patient's restlessness. The                            patient tolerated the procedure fairly well, with a                            lot of oral secretions and difficulty to sedate                            adequately with resultant restlessness. Scope In: Scope Out: Findings:      The larynx was normal.      Severe esophagitis with no bleeding was found from roughly 30 to 40 cm       from the incisors (lower half of esophagus), characterized by moderately       severe erythema exudate. No plaques to suggest Candida, no focal       ulcerations to suggest viral infection. It had a nonspecific appearance,       possibly from severe reflux, possibly from caustic ingestion. Biopsies       were taken with a cold forceps for histology. Estimated blood loss was       minimal.      A small hiatal hernia  was present.      There is no endoscopic evidence of stricture, varices or mass in the       entire esophagus.      Diffuse mildly erythematous mucosa without bleeding was found in the       gastric antrum.      A single 8 mm papule (nodule) with no bleeding was found in the       prepyloric region of the stomach. This looked like inflamed, edematous       mucosa rather than a discrete lesion. Biopsies were taken with a cold       forceps for histology.      The cardia and gastric fundus were normal on retroflexion.      The examined duodenum was normal. Impression:               - Normal larynx.                            - Severe erosive esophagitis, nonspecific in                            appearance so etiology is unclear, but not classic                            for infectious esophagitis. This readily explains                            the patient's severe chest pain and esophageal                            symptoms. Biopsied.                           - Small hiatal hernia.                           - Erythematous mucosa in the antrum.                           - A single papule (nodule) found in the stomach,                            probably gastritis-associated mucosal edema.                            Biopsied.                           - Normal examined duodenum. Recommendation:           - Continue present medications. Supportive care                            including pain medication. Liquid diet. Suspect                            esophagitis will resolve spontaneously over the  next week. Based on endoscopic appearance, no need                            for initiation of empiric anti-infective therapy                            for viral or fungal esophagitis while awaiting                            biopsy results.                           - Await pathology results and treat accordingly if                            a specific cause for the esophagitis is found. Procedure Code(s):        --- Professional ---                           [phone], Esophagogastroduodenoscopy, flexible,                            transoral; with biopsy, single or multiple Diagnosis Code(s):        --- Professional ---                           K20.8, Other esophagitis                           K31.89, Other diseases of stomach and duodenum                           R13.10, Dysphagia, unspecified CPT copyright 2019 American Medical Association. All rights reserved. The codes documented in this report are preliminary and upon coder review may  be revised to meet  current compliance requirements. Ronald Lobo, MD 01/12/2019 8:59:57 AM This report has been signed electronically. Number of Addenda: 0

## 2019-01-12 NOTE — Progress Notes (Signed)
EKG performed.

## 2019-01-12 NOTE — Progress Notes (Addendum)
PROGRESS NOTE    Todd [name].  JXB:[id number] DOB: 31-Jul-1956 DOA: 01/09/2019 PCP: [name], MD     Brief Narrative:   63 y.o. 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  Patient is not currently taking eliquis "though I should be".  Patient presents to the ED with c/o central to low CP / epigastric pain that is sharp and stabbing.  Worse with coughing and deep breaths and moving. Onset about 3 days ago.  Minor amount of cough.  Pain worsening especially with swallowing, poor PO intake over past couple of days.  ED Course: AKF with creat 4.1 and BUN 7.5 (1.0 and 18 in Jan this year).  EtOH level is <10.  UA with small blood otherwise neg.  Bladder scan only shows 28 ml.  CXR neg, COVID neg.  Tm 99.  Trop 0.03.   Assessment & Plan: 1-Acute kidney failure (HCC) -Continue minimizing Nephrotoxic agents -Continue IV rehydration -Continue to follow I's and O's. -Cr Function continue improving with IV fluids. -Repeat basic metabolic panel in a.m.  2-DM (diabetes mellitus), type 2 ([place]) -Avoid oral hypoglycemic agents while inpatient -Continue sliding scale insulin -A1c 6.4  3-Essential hypertension/tachycardia -Blood pressure overall stable -Continue PRN hydralazine. -SVT/PVCs.  Will use as needed Lopressor  4-CHEST PAIN -Atypical -Continue treatment for noncardiac causes including treatment for gastroesophageal reflux disease/esophagitis and pneumonia. -Will continue the use of PPI, Carafate and as needed GI cocktail. -GI service has been consulted for evaluation and decision on the need of endoscopic procedures. -Atypical infiltrates appreciated at lower bases on CT abdomen; continue IV antibiotics. -patient was COVID neg. -Continue PRN pain medications  5-Alcohol use disorder, severe, dependence ([place]) -will continue CIWA protocol -continue thiamine and folic acid -cessation counseling  provided.  6-Opioid use disorder, moderate, dependence ([place]) -continue current dose of narcotics -avoid overuse.  7-BPH -continue flomax  8-depression/anxiety -No suicidal ideation or hallucination -continue trazodone and Zoloft.  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 -Per GI recommendation will be safe to resume Xarelto in the next 24 to 48 hours.  DVT prophylaxis: Heparin drip Code Status: Full code Family Communication: No family at bedside Disposition Plan: Continue IV antibiotics for concerns of pneumonia seen on CT scan, troponin with flat elevation and no abnormalities seen on telemetry or EKGs.  Continue Protonix, full liquid diet, and PRN Lopressor.  Will follow GI service recommendations.  Follow-up biopsy results.  Consultants:   GI service  Procedures:   See below for x-ray reports.   EGD 01/12/2019  Antimicrobials:  Anti-infectives (From admission, onward)   Start     Dose/Rate Route Frequency Ordered Stop   01/10/19 1800  azithromycin (ZITHROMAX) tablet 250 mg     250 mg Oral Daily 01/09/19 2325 01/14/19 1759   01/10/19 0800  cefTRIAXone (ROCEPHIN) 1 g in sodium chloride 0.9 % 100 mL IVPB     1 g 200 mL/hr over 30 Minutes Intravenous Daily 01/10/19 0722     01/10/19 0000  azithromycin (ZITHROMAX) tablet 500 mg     500 mg Oral Daily 01/09/19 2325 01/10/19 0100      Subjective: No fever, no shortness of breath, no nausea or vomiting.  Still reporting significant midepigastric pain and odynophagia.  Objective: Vitals:   01/12/19 0850 01/12/19 0900 01/12/19 1224 01/12/19 1555  BP: (!) 204/95 (!) 214/95  (!) 157/75  Pulse: 72 70  74  Resp: 18 16  13  Temp:   98.1 F (36.7 C)   TempSrc:   Oral   SpO2: 98% 96%  94%  Weight:      Height:        Intake/Output Summary (Last 24 hours) at 01/12/2019 1610 Last data filed at 01/12/2019 1247 Gross per 24 hour  Intake 4697.79 ml  Output 2650 ml  Net 2047.79 ml    Filed Weights   01/09/19 1756 01/10/19 0000 01/12/19 0725  Weight: 102.1 kg 103.3 kg 103.3 kg    Examination: General exam: Alert, awake, oriented x 3; no shortness of breath, no chest pain.  Reports still significant midepigastric pain and odynophagia.  Reports increasing urine output. Respiratory system: Clear to auscultation. Respiratory effort normal. Cardiovascular system: Sinus tachycardia; No murmurs, rubs, gallops. Gastrointestinal system: Abdomen is nondistended, soft and nontender. No organomegaly or masses felt. Normal bowel sounds heard. Central nervous system: Alert and oriented. No focal neurological deficits. Extremities: No cyanosis or clubbing.  Trace edema bilaterally appreciated. Skin: No rashes, lesions or ulcers Psychiatry: Judgement and insight appear normal. Mood & affect appropriate.    Data Reviewed: I have personally reviewed following labs and imaging studies  CBC: Recent Labs  Lab 01/09/19 1759 01/10/19 0534 01/11/19 0247 01/12/19 0310  WBC 5.3 5.5 5.5 5.4  NEUTROABS 3.0  --   --   --   HGB 13.7 10.7* 11.4* 11.8*  HCT 41.0 31.3* 33.5* 34.6*  MCV 95.8 94.8 94.6 95.8  PLT 265 230 255 185   Basic Metabolic Panel: Recent Labs  Lab 01/09/19 1759 01/10/19 0534 01/11/19 0247  NA 140 143 143  K 4.0 3.1* 3.6  CL 107 117* 111  CO2 17* 18* 20*  GLUCOSE 127* 134* 105*  BUN 75* 61* 48*  CREATININE 4.11* 3.11* 2.85*  CALCIUM 9.4 7.6* 8.5*  MG  --   --  1.6*   GFR: Estimated Creatinine Clearance: 31.8 mL/min (A) (by C-G formula based on SCr of 2.85 mg/dL (H)).   Liver Function Tests: Recent Labs  Lab 01/09/19 1759  AST 26  ALT 45*  ALKPHOS 85  BILITOT 0.5  PROT 7.8  ALBUMIN 3.3*   Recent Labs  Lab 01/09/19 1759  LIPASE 49   Cardiac Enzymes: Recent Labs  Lab 01/09/19 1759 01/09/19 2354 01/10/19 0534 01/10/19 0923  TROPONINI 0.03* 0.04* 0.03* 0.03*   CBG: Recent Labs  Lab 01/11/19 1708 01/11/19 2009 01/12/19 0117  01/12/19 0411 01/12/19 1221  GLUCAP 109* 114* 97 117* 192*   Urine analysis:    Component Value Date/Time   COLORURINE YELLOW 01/09/2019 2100   APPEARANCEUR HAZY (A) 01/09/2019 2100   LABSPEC 1.012 01/09/2019 2100   PHURINE 5.0 01/09/2019 2100   GLUCOSEU NEGATIVE 01/09/2019 2100   HGBUR SMALL (A) 01/09/2019 2100   BILIRUBINUR NEGATIVE 01/09/2019 2100   KETONESUR NEGATIVE 01/09/2019 2100   PROTEINUR NEGATIVE 01/09/2019 2100   UROBILINOGEN 0.2 12/24/2009 1944   NITRITE NEGATIVE 01/09/2019 2100   LEUKOCYTESUR NEGATIVE 01/09/2019 2100    Recent Results (from the past 240 hour(s))  SARS Coronavirus 2 (CEPHEID- Performed in [facility] hospital lab), Hosp Order     Status: None   Collection Time: 01/09/19  6:26 PM  Result Value Ref Range Status   SARS Coronavirus 2 NEGATIVE NEGATIVE Final    Comment: (NOTE) If result is NEGATIVE SARS-CoV-2 target nucleic acids are NOT DETECTED. The SARS-CoV-2 RNA is generally detectable in upper and lower  respiratory specimens during the acute phase of infection. The lowest  concentration of SARS-CoV-2 viral copies this assay can detect is 250  copies / mL. A negative result does not preclude SARS-CoV-2 infection  and should not be used as the sole basis for treatment or other  patient management decisions.  A negative result may occur with  improper specimen collection / handling, submission of specimen other  than nasopharyngeal swab, presence of viral mutation(s) within the  areas targeted by this assay, and inadequate number of viral copies  (<250 copies / mL). A negative result must be combined with clinical  observations, patient history, and epidemiological information. If result is POSITIVE SARS-CoV-2 target nucleic acids are DETECTED. The SARS-CoV-2 RNA is generally detectable in upper and lower  respiratory specimens dur ing the acute phase of infection.  Positive  results are indicative of active infection with SARS-CoV-2.   Clinical  correlation with patient history and other diagnostic information is  necessary to determine patient infection status.  Positive results do  not rule out bacterial infection or co-infection with other viruses. If result is PRESUMPTIVE POSTIVE SARS-CoV-2 nucleic acids MAY BE PRESENT.   A presumptive positive result was obtained on the submitted specimen  and confirmed on repeat testing.  While 2019 novel coronavirus  (SARS-CoV-2) nucleic acids may be present in the submitted sample  additional confirmatory testing may be necessary for epidemiological  and / or clinical management purposes  to differentiate between  SARS-CoV-2 and other Sarbecovirus currently known to infect humans.  If clinically indicated additional testing with an alternate test  methodology [phone]) is advised. The SARS-CoV-2 RNA is generally  detectable in upper and lower respiratory sp ecimens during the acute  phase of infection. The expected result is Negative. Fact Sheet for Patients:  StrictlyIdeas.no Fact Sheet for Healthcare Providers: BankingDealers.co.za This test is not yet approved or cleared by the [place] FDA and has been authorized for detection and/or diagnosis of SARS-CoV-2 by FDA under an Emergency Use Authorization (EUA).  This EUA will remain in effect (meaning this test can be used) for the duration of the COVID-19 declaration under Section 564(b)(1) of the Act, 21 U.S.C. section 360bbb-3(b)(1), unless the authorization is terminated or revoked sooner. Performed at [facility], [place] [address], [place], [place] [address]   Urine culture     Status: Abnormal   Collection Time: 01/09/19  9:31 PM  Result Value Ref Range Status   Specimen Description URINE, RANDOM  Final   Special Requests NONE  Final   Culture (A)  Final    <10,000 COLONIES/mL INSIGNIFICANT GROWTH Performed at [facility], [place] [address]  [place], [place] [address]    Report Status 01/10/2019 FINAL  Final  Surgical pcr screen     Status: None   Collection Time: 01/11/19 10:18 PM  Result Value Ref Range Status   MRSA, PCR NEGATIVE NEGATIVE Final   Staphylococcus aureus NEGATIVE NEGATIVE Final    Comment: (NOTE) The Xpert SA Assay (FDA approved for NASAL specimens in patients 76 years of age and older), is one component of a comprehensive surveillance program. It is not intended to diagnose infection nor to guide or monitor treatment. Performed at [facility], [place] [address], [place], [place] [address]      Radiology Studies: No results found.  Scheduled Meds:  atorvastatin  10 mg Oral q1800   azithromycin  250 mg Oral Daily   folic acid  1 mg Oral Daily   insulin aspart  0-9 Units Subcutaneous Q4H  multivitamin with minerals  1 tablet Oral Daily   [START ON 01/15/2019] pantoprazole  40 mg Intravenous Q12H   sertraline  100 mg Oral q morning - 10a   tamsulosin  0.4 mg Oral QPM   thiamine  100 mg Oral Daily   Or   thiamine  100 mg Intravenous Daily   traZODone  200 mg Oral QHS   Continuous Infusions:  sodium chloride 125 mL/hr at 01/12/19 1551   cefTRIAXone (ROCEPHIN)  IV 1 g (01/12/19 1009)   heparin 1,600 Units/hr (01/12/19 1315)   pantoprozole (PROTONIX) infusion 8 mg/hr (01/12/19 1210)     LOS: 3 days    Time spent: 35 minutes.  Greater than 50% of this time has been spent in direct contact with the patient, coordinating care and discussing relevant ongoing clinical issues with a specialist.  Patient continues.  Seen midepigastric discomfort and odynophagia.  Status post endoscopic procedure that has demonstrated severe distal esophagitis and antral gastritis.  No bleeding or ulcers appreciated.  Patient has also been experiencing this afternoon intermittent episodes of PVCs and SVT.   Barton Dubois, MD [facility] Pager [phone]  01/12/2019, 4:10 PM

## 2019-01-12 NOTE — Consult Note (Signed)
   [facility] CM Inpatient Consult   01/12/2019  Jaycion Treml. 1956/02/14 [id number]   Patient screened for extreme high risk score for unplanned readmission score and for hospitalizations and multiple ED visits noted. to check if potential [place] Management services are needed.  Patient was in a procedure this morning.  Please place a [facility] Care Management consult as appropriate and for questions contact:   Natividad Brood, RN BSN [facility] Liaison  [phone] business mobile phone Toll free office [phone]  Fax number: [phone] [email or web address]

## 2019-01-12 NOTE — Progress Notes (Signed)
Notified by tele of pt's HR in 160s and in SVT. Pt asymptomatic. MD paged. Awaiting orders. Will continue to monitor.

## 2019-01-12 NOTE — Progress Notes (Signed)
Pt's HR sustaining 160s with complex SVT. MD paged for EKG. Awaiting orders; Will continue to monitor.

## 2019-01-12 NOTE — Anesthesia Preprocedure Evaluation (Signed)
Anesthesia Evaluation  Patient identified by MRN, date of birth, ID band Patient awake    Reviewed: Allergy & Precautions, H&P , NPO status , Patient's Chart, lab work & pertinent test results  Airway Mallampati: II  TM Distance: >3 FB Neck ROM: Full    Dental no notable dental hx.    Pulmonary sleep apnea , Current Smoker, former smoker,    Pulmonary exam normal breath sounds clear to auscultation       Cardiovascular hypertension, Pt. on medications Normal cardiovascular exam+ dysrhythmias  Rhythm:Regular Rate:Normal  RBBB   Neuro/Psych PSYCHIATRIC DISORDERS Depression negative neurological ROS     GI/Hepatic negative GI ROS, (+) Hepatitis -  Endo/Other  diabetes, Type 2, Oral Hypoglycemic AgentsMorbid obesity  Renal/GU negative Renal ROS  negative genitourinary   Musculoskeletal negative musculoskeletal ROS (+)   Abdominal (+) + obese,   Peds negative pediatric ROS (+)  Hematology negative hematology ROS (+)   Anesthesia Other Findings   Reproductive/Obstetrics negative OB ROS                             Anesthesia Physical  Anesthesia Plan  ASA: III  Anesthesia Plan: [name]   Post-op Pain Management:    Induction: Intravenous  PONV Risk Score and Plan: 0  Airway Management Planned: Nasal Cannula  Additional Equipment:   Intra-op Plan:   Post-operative Plan:   Informed Consent: I have reviewed the patients History and Physical, chart, labs and discussed the procedure including the risks, benefits and alternatives for the proposed anesthesia with the patient or authorized representative who has indicated his/her understanding and acceptance.     Dental advisory given  Plan Discussed with: CRNA  Anesthesia Plan Comments:         Anesthesia Quick Evaluation

## 2019-01-13 LAB — CBC
HCT: 35.5 % — ABNORMAL LOW (ref 39.0–52.0)
Hemoglobin: 11.8 g/dL — ABNORMAL LOW (ref 13.0–17.0)
MCH: 32.2 pg (ref 26.0–34.0)
MCHC: 33.2 g/dL (ref 30.0–36.0)
MCV: 96.7 fL (ref 80.0–100.0)
Platelets: 206 10*3/uL (ref 150–400)
RBC: 3.67 MIL/uL — ABNORMAL LOW (ref 4.22–5.81)
RDW: 13.9 % (ref 11.5–15.5)
WBC: 6.5 10*3/uL (ref 4.0–10.5)
nRBC: 0 % (ref 0.0–0.2)

## 2019-01-13 LAB — GLUCOSE, CAPILLARY
Glucose-Capillary: 102 mg/dL — ABNORMAL HIGH (ref 70–99)
Glucose-Capillary: 102 mg/dL — ABNORMAL HIGH (ref 70–99)
Glucose-Capillary: 110 mg/dL — ABNORMAL HIGH (ref 70–99)
Glucose-Capillary: 153 mg/dL — ABNORMAL HIGH (ref 70–99)
Glucose-Capillary: 88 mg/dL (ref 70–99)
Glucose-Capillary: 99 mg/dL (ref 70–99)

## 2019-01-13 LAB — BASIC METABOLIC PANEL
Anion gap: 11 (ref 5–15)
BUN: 21 mg/dL (ref 8–23)
CO2: 20 mmol/L — ABNORMAL LOW (ref 22–32)
Calcium: 8.2 mg/dL — ABNORMAL LOW (ref 8.9–10.3)
Chloride: 110 mmol/L (ref 98–111)
Creatinine, Ser: 2.04 mg/dL — ABNORMAL HIGH (ref 0.61–1.24)
GFR calc Af Amer: 39 mL/min — ABNORMAL LOW (ref >60)
GFR calc non Af Amer: 34 mL/min — ABNORMAL LOW (ref >60)
Glucose, Bld: 104 mg/dL — ABNORMAL HIGH (ref 70–99)
Potassium: 3.4 mmol/L — ABNORMAL LOW (ref 3.5–5.1)
Sodium: 141 mmol/L (ref 135–145)

## 2019-01-13 LAB — HEPARIN LEVEL (UNFRACTIONATED)
Heparin Unfractionated: <0.1 IU/mL — ABNORMAL LOW (ref 0.30–0.70)
Heparin Unfractionated: 0.34 IU/mL (ref 0.30–0.70)

## 2019-01-13 MED ORDER — MORPHINE SULFATE (CONCENTRATE) 10 MG/0.5ML PO SOLN
10.0000 mg | Freq: Four times a day (QID) | ORAL | Status: DC | PRN
  Administered 2019-01-13 – 2019-01-16 (×10): 10 mg via ORAL
  Filled 2019-01-13 (×10): qty 0.5

## 2019-01-13 MED ORDER — HEPARIN (PORCINE) 25000 UT/250ML-% IV SOLN
1500.0000 [IU]/h | INTRAVENOUS | Status: DC
  Administered 2019-01-13 – 2019-01-15 (×4): 1600 [IU]/h via INTRAVENOUS
  Administered 2019-01-16: 1500 [IU]/h via INTRAVENOUS
  Filled 2019-01-13 (×5): qty 250

## 2019-01-13 MED ORDER — PROMETHAZINE HCL 25 MG/ML IJ SOLN
12.5000 mg | Freq: Once | INTRAMUSCULAR | Status: AC
  Administered 2019-01-13: 12.5 mg via INTRAVENOUS
  Filled 2019-01-13: qty 1

## 2019-01-13 NOTE — Progress Notes (Signed)
PROGRESS NOTE    Todd [name].  ZHG:[id number] DOB: Nov 10, 1955 DOA: 01/09/2019 PCP: Todd Carol, MD     Brief Narrative:   63 y.o. 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  Patient is not currently taking eliquis "though I should be".  Patient presents to the ED with c/o central to low CP / epigastric pain that is sharp and stabbing.  Worse with coughing and deep breaths and moving. Onset about 3 days ago.  Minor amount of cough.  Pain worsening especially with swallowing, poor PO intake over past couple of days.  ED Course: AKF with creat 4.1 and BUN 7.5 (1.0 and 18 in Jan this year).  EtOH level is <10.  UA with small blood otherwise neg.  Bladder scan only shows 28 ml.  CXR neg, COVID neg.  Tm 99.  Trop 0.03.   Assessment & Plan: 1-Acute kidney failure ([place]) -Continue minimizing Nephrotoxic agents -Continue to follow I's and O's. -Cr Function continue improving with IV fluids. -Creatinine down to 2.04.  2-DM (diabetes mellitus), type 2 (HCC) -Avoid oral hypoglycemic agents while inpatient -Continue sliding scale insulin -A1c 6.4  3-Essential hypertension/tachycardia -Blood pressure overall stable -Continue PRN hydralazine and PRN lopressor.  4-CHEST PAIN -Atypical -Continue treatment for noncardiac causes including treatment for gastroesophageal reflux disease/esophagitis and pneumonia. -Will continue the use of PPI, Carafate and as needed GI cocktail. -GI service has been consulted for evaluation and decision on the need of endoscopic procedures. -Continue IV antibiotics, aiming for a total of 7 days of antibiotics. -Patient remains afebrile and denies shortness of breath. -Continue PRN pain medications  5-Alcohol use disorder, severe, dependence ([place]) -will continue CIWA protocol -continue thiamine and folic acid -cessation counseling provided.  6-Opioid use disorder, moderate,  dependence ([place]) -continue current dose of narcotics -avoid overuse.  7-BPH -continue flomax  8-depression/anxiety -No suicidal ideation or hallucination -continue trazodone and Zoloft.  DVT prophylaxis: Heparin drip Code Status: Full code Family Communication: No family at bedside Disposition Plan: Continue IV antibiotics for concerns of pneumonia seen on CT scan, troponin with flat elevation and no abnormalities seen on telemetry or EKGs.  Continue Protonix, endoscopy with signs of acute bleeding; positive distal severe esophagitis and gastritis.  Having difficulty tolerating by mouth, only tolerating intermittent sips of clear liquids.  Consultants:   GI service  Procedures:   See below for x-ray reports.   EGD in 20  Antimicrobials:  Anti-infectives (From admission, onward)   Start     Dose/Rate Route Frequency Ordered Stop   01/10/19 1800  azithromycin (ZITHROMAX) tablet 250 mg     250 mg Oral Daily 01/09/19 2325 01/14/19 1759   01/10/19 0800  cefTRIAXone (ROCEPHIN) 1 g in sodium chloride 0.9 % 100 mL IVPB     1 g 200 mL/hr over 30 Minutes Intravenous Daily 01/10/19 0722     01/10/19 0000  azithromycin (ZITHROMAX) tablet 500 mg     500 mg Oral Daily 01/09/19 2325 01/10/19 0100      Subjective: No fever, no shortness of breath, no nausea, no vomiting.  Still complaining of significant midepigastric discomfort and odynophagia.  Patient unable to tolerate anything for the nodules intermittent sips of clear liquids.  Objective: Vitals:   01/13/19 0000 01/13/19 0211 01/13/19 0324 01/13/19 1233  BP:  (!) 160/105 (!) 159/90 (!) 178/95  Pulse:  73 65 67  Resp:  16 13   Temp: 98.4 F (36.9 C)  98.6 F (37 C)  TempSrc: Oral   Oral  SpO2:  92% 93% 90%  Weight:      Height:        Intake/Output Summary (Last 24 hours) at 01/13/2019 1234 Last data filed at 01/13/2019 1100 Gross per 24 hour  Intake 2925.84 ml  Output 1930 ml  Net 995.84 ml   Filed Weights    01/09/19 1756 01/10/19 0000 01/12/19 0725  Weight: 102.1 kg 103.3 kg 103.3 kg    Examination: General exam: Alert, awake, oriented x 3, still complaining of excruciating discomfort in his mid epigastric area and experiencing odynophagia and difficulty swallowing.  No nausea or vomiting.  Patient is afebrile. Respiratory system: Clear to auscultation. Respiratory effort normal. Cardiovascular system:RRR. No murmurs, rubs, gallops. Gastrointestinal system: Abdomen is nondistended, soft and and experiencing mild discomfort with deep palpation in mid epigastric region.  No organomegaly or masses felt. Normal bowel sounds heard. Central nervous system: Alert and oriented. No focal neurological deficits. Extremities: No cyanosis or clubbing; trace edema appreciated bilaterally.  No pain to palpation in his lower extremities. Skin: No rashes, lesions or ulcers Psychiatry: Judgement and insight appear normal. Mood & affect appropriate.    Data Reviewed: I have personally reviewed following labs and imaging studies  CBC: Recent Labs  Lab 01/09/19 1759 01/10/19 0534 01/11/19 0247 01/12/19 0310 01/13/19 0215  WBC 5.3 5.5 5.5 5.4 6.5  NEUTROABS 3.0  --   --   --   --   HGB 13.7 10.7* 11.4* 11.8* 11.8*  HCT 41.0 31.3* 33.5* 34.6* 35.5*  MCV 95.8 94.8 94.6 95.8 96.7  PLT 265 230 255 235 409   Basic Metabolic Panel: Recent Labs  Lab 01/09/19 1759 01/10/19 0534 01/11/19 0247 01/13/19 0215  NA 140 143 143 141  K 4.0 3.1* 3.6 3.4*  CL 107 117* 111 110  CO2 17* 18* 20* 20*  GLUCOSE 127* 134* 105* 104*  BUN 75* 61* 48* 21  CREATININE 4.11* 3.11* 2.85* 2.04*  CALCIUM 9.4 7.6* 8.5* 8.2*  MG  --   --  1.6*  --    GFR: Estimated Creatinine Clearance: 44.4 mL/min (A) (by C-G formula based on SCr of 2.04 mg/dL (H)).   Liver Function Tests: Recent Labs  Lab 01/09/19 1759  AST 26  ALT 45*  ALKPHOS 85  BILITOT 0.5  PROT 7.8  ALBUMIN 3.3*   Recent Labs  Lab 01/09/19 1759  LIPASE  49   Cardiac Enzymes: Recent Labs  Lab 01/09/19 1759 01/09/19 2354 01/10/19 0534 01/10/19 0923  TROPONINI 0.03* 0.04* 0.03* 0.03*   CBG: Recent Labs  Lab 01/12/19 2011 01/12/19 2358 01/13/19 0412 01/13/19 0837 01/13/19 1231  GLUCAP 117* 102* 99 102* 110*   Urine analysis:    Component Value Date/Time   COLORURINE YELLOW 01/09/2019 2100   APPEARANCEUR HAZY (A) 01/09/2019 2100   LABSPEC 1.012 01/09/2019 2100   PHURINE 5.0 01/09/2019 2100   GLUCOSEU NEGATIVE 01/09/2019 2100   HGBUR SMALL (A) 01/09/2019 2100   BILIRUBINUR NEGATIVE 01/09/2019 2100   KETONESUR NEGATIVE 01/09/2019 2100   PROTEINUR NEGATIVE 01/09/2019 2100   UROBILINOGEN 0.2 12/24/2009 1944   NITRITE NEGATIVE 01/09/2019 2100   LEUKOCYTESUR NEGATIVE 01/09/2019 2100    Recent Results (from the past 240 hour(s))  SARS Coronavirus 2 (CEPHEID- Performed in [place] hospital lab), Hosp Order     Status: None   Collection Time: 01/09/19  6:26 PM  Result Value Ref Range Status   SARS Coronavirus 2 NEGATIVE  NEGATIVE Final    Comment: (NOTE) If result is NEGATIVE SARS-CoV-2 target nucleic acids are NOT DETECTED. The SARS-CoV-2 RNA is generally detectable in upper and lower  respiratory specimens during the acute phase of infection. The lowest  concentration of SARS-CoV-2 viral copies this assay can detect is 250  copies / mL. A negative result does not preclude SARS-CoV-2 infection  and should not be used as the sole basis for treatment or other  patient management decisions.  A negative result may occur with  improper specimen collection / handling, submission of specimen other  than nasopharyngeal swab, presence of viral mutation(s) within the  areas targeted by this assay, and inadequate number of viral copies  (<250 copies / mL). A negative result must be combined with clinical  observations, patient history, and epidemiological information. If result is POSITIVE SARS-CoV-2 target nucleic acids are  DETECTED. The SARS-CoV-2 RNA is generally detectable in upper and lower  respiratory specimens dur ing the acute phase of infection.  Positive  results are indicative of active infection with SARS-CoV-2.  Clinical  correlation with patient history and other diagnostic information is  necessary to determine patient infection status.  Positive results do  not rule out bacterial infection or co-infection with other viruses. If result is PRESUMPTIVE POSTIVE SARS-CoV-2 nucleic acids MAY BE PRESENT.   A presumptive positive result was obtained on the submitted specimen  and confirmed on repeat testing.  While 2019 novel coronavirus  (SARS-CoV-2) nucleic acids may be present in the submitted sample  additional confirmatory testing may be necessary for epidemiological  and / or clinical management purposes  to differentiate between  SARS-CoV-2 and other Sarbecovirus currently known to infect humans.  If clinically indicated additional testing with an alternate test  methodology [phone]) is advised. The SARS-CoV-2 RNA is generally  detectable in upper and lower respiratory sp ecimens during the acute  phase of infection. The expected result is Negative. Fact Sheet for Patients:  StrictlyIdeas.no Fact Sheet for Healthcare Providers: BankingDealers.co.za This test is not yet approved or cleared by the [place] FDA and has been authorized for detection and/or diagnosis of SARS-CoV-2 by FDA under an Emergency Use Authorization (EUA).  This EUA will remain in effect (meaning this test can be used) for the duration of the COVID-19 declaration under Section 564(b)(1) of the Act, 21 U.S.C. section 360bbb-3(b)(1), unless the authorization is terminated or revoked sooner. Performed at [facility], [place] [address] [place], [place] [address]   Urine culture     Status: Abnormal   Collection Time: 01/09/19  9:31 PM  Result Value Ref Range  Status   Specimen Description URINE, RANDOM  Final   Special Requests NONE  Final   Culture (A)  Final    <10,000 COLONIES/mL INSIGNIFICANT GROWTH Performed at [facility], [place] [address] [place], [place] [address]    Report Status 01/10/2019 FINAL  Final  Surgical pcr screen     Status: None   Collection Time: 01/11/19 10:18 PM  Result Value Ref Range Status   MRSA, PCR NEGATIVE NEGATIVE Final   Staphylococcus aureus NEGATIVE NEGATIVE Final    Comment: (NOTE) The Xpert SA Assay (FDA approved for NASAL specimens in patients 75 years of age and older), is one component of a comprehensive surveillance program. It is not intended to diagnose infection nor to guide or monitor treatment. Performed at [facility], [place] [address], [place], [place] [address]      Radiology  Studies: No results found.  Scheduled Meds: . atorvastatin  10 mg Oral q1800  . azithromycin  250 mg Oral Daily  . folic acid  1 mg Oral Daily  . insulin aspart  0-9 Units Subcutaneous Q4H  . multivitamin with minerals  1 tablet Oral Daily  . [START ON 01/15/2019] pantoprazole  40 mg Intravenous Q12H  . sertraline  100 mg Oral q morning - 10a  . tamsulosin  0.4 mg Oral QPM  . thiamine  100 mg Oral Daily   Or  . thiamine  100 mg Intravenous Daily  . traZODone  200 mg Oral QHS   Continuous Infusions: . sodium chloride 75 mL/hr at 01/13/19 0739  . cefTRIAXone (ROCEPHIN)  IV 1 g (01/13/19 0742)  . heparin 1,600 Units/hr (01/13/19 0738)  . pantoprozole (PROTONIX) infusion 8 mg/hr (01/13/19 0739)     LOS: 4 days    Time spent: 35 minutes.  Greater than 50% of this time has been spent in direct contact with the patient, coordinating care and discussing intervention plans with patient directly and with the nursing staff.  Patient continued to experience ongoing midepigastric discomfort and odynophagia, having difficulty swallowing anything other than sips of liquids and experiencing no much response  with current analgesic therapy.  Appreciate GI service assistance and recommendations, will continue IV PPI infusion and also continue IV heparin.   Barton Dubois, MD [facility] Pager [phone]  01/13/2019, 12:34 PM

## 2019-01-13 NOTE — Progress Notes (Signed)
ANTICOAGULATION CONSULT NOTE - Follow Up Consult  Pharmacy Consult for heparin Indication: h/o Afib/VTE  Labs: Recent Labs    01/10/19 0534 01/10/19 0923  01/11/19 0247  01/11/19 2058 01/12/19 0310 01/12/19 1936 01/13/19 0215  HGB 10.7*  --   --  11.4*  --   --  11.8*  --  11.8*  HCT 31.3*  --   --  33.5*  --   --  34.6*  --  35.5*  PLT 230  --   --  255  --   --  235  --  206  HEPARINUNFRC 0.17*  --    < > 0.17*   < > 0.74*  --  0.39 <0.10*  CREATININE 3.11*  --   --  2.85*  --   --   --   --  2.04*  TROPONINI 0.03* 0.03*  --   --   --   --   --   --   --    < > = values in this interval not displayed.    Assessment/Plan:  Heparin gtt order inadvertently entered for 10hr and RN turned off gtt at 10p as ordered; RN now aware to resume heparin gtt for h/o Afib/VTE.  Will check heparin level in [place], PharmD, BCPS  01/13/2019,3:19 AM

## 2019-01-13 NOTE — Progress Notes (Signed)
ANTICOAGULATION CONSULT NOTE - Follow Up Consult  Pharmacy Consult for heparin Indication: h/o Afib/VTE  Labs: Recent Labs    01/11/19 0247  01/12/19 0310 01/12/19 1936 01/13/19 0215 01/13/19 1108  HGB 11.4*  --  11.8*  --  11.8*  --   HCT 33.5*  --  34.6*  --  35.5*  --   PLT 255  --  235  --  206  --   HEPARINUNFRC 0.17*   < >  --  0.39 <0.10* 0.34  CREATININE 2.85*  --   --   --  2.04*  --    < > = values in this interval not displayed.    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  Started on IV heparin for Afib and h/o VTEs.  Reported noncompliant with PTA Eliquis.   S/p endoscopy 01/12/19,  found severe distal esophagitis, does not look infectious per GI. GI started IV PPI (protonix) bolus, infusion then  on 6/1 switch to IV 40mg  q12h.  Heparin resumed on 5/29 at 12noon post endoscopy as appropriate per GI.  Heparin gtt order inadvertently entered for only 10hr and RN turned off gtt at 10pm last night 5/29.   Heparin infusion resumed at 1600 units/hr and now the 8 hour heparin level is  0.34(therapeutic).   H/H low/stable 11.8/35.5 and pltc 206 within normal limits. No bleeding reported.   Goal of Therapy:  Heparin level 0.3-0.7 units/ml   Plan:  - Continue Heparin at 1600 units/hr (16 ml/hr) -  Daily HL, CBC  -f/u when to restart oral anticoagulation  (h/o noncompliance pta noted)  Nicole Cella, [place] [phone] Pager: [phone] Please utilize [facility] for appropriate phone number to reach the unit pharmacist ([place]) 01/13/2019 12:09 PM

## 2019-01-13 NOTE — Plan of Care (Signed)
  Problem: Clinical Measurements: Goal: Cardiovascular complication will be avoided Outcome: Not Progressing   Problem: Clinical Measurements: Goal: Respiratory complications will improve Outcome: Progressing   Problem: Clinical Measurements: Goal: Ability to maintain clinical measurements within normal limits will improve Outcome: Progressing

## 2019-01-13 NOTE — Plan of Care (Signed)

## 2019-01-13 NOTE — Progress Notes (Addendum)
Dr. Dyann Kief notified about patient having another episode of SVT in the 170's. Will continue to monitor.   Also informed him that patient continues to complain of severe 10/10 chest pain despite pain medications. Will continue to monitor.

## 2019-01-14 LAB — CBC
HCT: 36.2 % — ABNORMAL LOW (ref 39.0–52.0)
Hemoglobin: 12 g/dL — ABNORMAL LOW (ref 13.0–17.0)
MCH: 32.1 pg (ref 26.0–34.0)
MCHC: 33.1 g/dL (ref 30.0–36.0)
MCV: 96.8 fL (ref 80.0–100.0)
Platelets: 199 10*3/uL (ref 150–400)
RBC: 3.74 MIL/uL — ABNORMAL LOW (ref 4.22–5.81)
RDW: 13.8 % (ref 11.5–15.5)
WBC: 6.6 10*3/uL (ref 4.0–10.5)
nRBC: 0 % (ref 0.0–0.2)

## 2019-01-14 LAB — GLUCOSE, CAPILLARY
Glucose-Capillary: 82 mg/dL (ref 70–99)
Glucose-Capillary: 89 mg/dL (ref 70–99)
Glucose-Capillary: 111 mg/dL — ABNORMAL HIGH (ref 70–99)
Glucose-Capillary: 118 mg/dL — ABNORMAL HIGH (ref 70–99)
Glucose-Capillary: 137 mg/dL — ABNORMAL HIGH (ref 70–99)
Glucose-Capillary: 157 mg/dL — ABNORMAL HIGH (ref 70–99)

## 2019-01-14 LAB — HEPARIN LEVEL (UNFRACTIONATED): Heparin Unfractionated: 0.65 IU/mL (ref 0.30–0.70)

## 2019-01-14 MED ORDER — SUCRALFATE 1 GM/10ML PO SUSP
1.0000 g | Freq: Three times a day (TID) | ORAL | Status: DC
  Administered 2019-01-14 – 2019-01-18 (×17): 1 g via ORAL
  Filled 2019-01-14 (×17): qty 10

## 2019-01-14 NOTE — Progress Notes (Signed)
PROGRESS NOTE    Todd [name].  KXF:[id number] DOB: 07-22-1956 DOA: 01/09/2019 PCP: [name], MD     Brief Narrative:   63 y.o. 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  Patient is not currently taking eliquis "though I should be".  Patient presents to the ED with c/o central to low CP / epigastric pain that is sharp and stabbing.  Worse with coughing and deep breaths and moving. Onset about 3 days ago.  Minor amount of cough.  Pain worsening especially with swallowing, poor PO intake over past couple of days.  ED Course: AKF with creat 4.1 and BUN 7.5 (1.0 and 18 in Jan this year).  EtOH level is <10.  UA with small blood otherwise neg.  Bladder scan only shows 28 ml.  CXR neg, COVID neg.  Tm 99.  Trop 0.03.   Assessment & Plan: 1-Acute kidney failure ([place]) -Continue minimizing Nephrotoxic agents -Continue to follow I's and O's. -Follow renal function in a.m. -Creatinine continue trending down and improving.  2-DM (diabetes mellitus), type 2 ([place]) -Avoid oral hypoglycemic agents while inpatient -Continue sliding scale insulin -A1c 6.4  3-Essential hypertension/tachycardia -Blood pressure overall stable -Continue PRN hydralazine and PRN lopressor.  4-CHEST PAIN -Atypical -Continue treatment for noncardiac causes including treatment for gastroesophageal reflux disease/esophagitis and pneumonia. -Will continue the use of PPI and as needed GI cocktail. -GI service has been consulted for evaluation and decision on the need of endoscopic procedures. -Continue IV antibiotics, aiming for a total of 7 days of antibiotics. -Patient remains afebrile and denies shortness of breath. -Continue PRN pain medications -Resume the use of Carafate.  5-Alcohol use disorder, severe, dependence ([place]) -will continue CIWA protocol -continue thiamine and folic acid -cessation counseling provided.   6-Opioid use disorder, moderate, dependence ([place]) -continue current dose of narcotics -avoid overuse.  7-BPH -continue flomax  8-depression/anxiety -No suicidal ideation or hallucination -continue trazodone and Zoloft.  DVT prophylaxis: Heparin drip Code Status: Full code Family Communication: No family at bedside Disposition Plan: Continue IV antibiotics for concerns of pneumonia seen on CT scan, troponin with flat elevation and no abnormalities seen on telemetry or EKGs.  Continue Protonix, endoscopy with signs of acute bleeding; positive distal severe esophagitis and gastritis.  Having difficulty tolerating by mouth, only tolerating intermittent sips of clear liquids.  Resume the use of Carafate.  Consultants:   GI service  Procedures:   See below for x-ray reports.   EGD in 20  Antimicrobials:  Anti-infectives (From admission, onward)   Start     Dose/Rate Route Frequency Ordered Stop   01/10/19 1800  azithromycin (ZITHROMAX) tablet 250 mg     250 mg Oral Daily 01/09/19 2325 01/13/19 1736   01/10/19 0800  cefTRIAXone (ROCEPHIN) 1 g in sodium chloride 0.9 % 100 mL IVPB     1 g 200 mL/hr over 30 Minutes Intravenous Daily 01/10/19 0722     01/10/19 0000  azithromycin (ZITHROMAX) tablet 500 mg     500 mg Oral Daily 01/09/19 2325 01/10/19 0100      Subjective: No fever, no shortness of breath, no nausea, no vomiting.  Still complaining of significant midepigastric discomfort and odynophagia.  Objective: Vitals:   01/13/19 1839 01/13/19 2230 01/14/19 0024 01/14/19 0654  BP: (!) 149/70 (!) 160/95 138/79 (!) 157/91  Pulse: 74 83 63 64  Resp: 12 20 16 18   Temp:  98.8 F (37.1 C)  98.6 F (37  C)  TempSrc:  Oral  Oral  SpO2: 90% 93% 91% 95%  Weight:      Height:        Intake/Output Summary (Last 24 hours) at 01/14/2019 1424 Last data filed at 01/14/2019 0931 Gross per 24 hour  Intake 1938.66 ml  Output 2625 ml  Net -686.34 ml   Filed Weights   01/09/19 1756  01/10/19 0000 01/12/19 0725  Weight: 102.1 kg 103.3 kg 103.3 kg    Examination: General exam: Alert, awake, oriented x 3; no fever, no nausea, no vomiting.  Continues to have significant midepigastric discomfort and renal failure.  Having severe difficulty swallowing or taking by mouth pills. Respiratory system: Clear to auscultation. Respiratory effort normal. Cardiovascular system:RRR. No murmurs, rubs, gallops. Gastrointestinal system: Abdomen is obese, nondistended, soft and nontender. No organomegaly or masses felt. Normal bowel sounds heard. Central nervous system: Alert and oriented. No focal neurological deficits. Extremities: No cyanosis or clubbing; trace edema appreciated bilaterally (unchanged and chronic as per patient report). Skin: No rashes, lesions or ulcers Psychiatry: Judgement and insight appear normal. Mood & affect appropriate.    Data Reviewed: I have personally reviewed following labs and imaging studies  CBC: Recent Labs  Lab 01/09/19 1759 01/10/19 0534 01/11/19 0247 01/12/19 0310 01/13/19 0215 01/14/19 0312  WBC 5.3 5.5 5.5 5.4 6.5 6.6  NEUTROABS 3.0  --   --   --   --   --   HGB 13.7 10.7* 11.4* 11.8* 11.8* 12.0*  HCT 41.0 31.3* 33.5* 34.6* 35.5* 36.2*  MCV 95.8 94.8 94.6 95.8 96.7 96.8  PLT 265 230 255 235 206 161   Basic Metabolic Panel: Recent Labs  Lab 01/09/19 1759 01/10/19 0534 01/11/19 0247 01/13/19 0215  NA 140 143 143 141  K 4.0 3.1* 3.6 3.4*  CL 107 117* 111 110  CO2 17* 18* 20* 20*  GLUCOSE 127* 134* 105* 104*  BUN 75* 61* 48* 21  CREATININE 4.11* 3.11* 2.85* 2.04*  CALCIUM 9.4 7.6* 8.5* 8.2*  MG  --   --  1.6*  --    GFR: Estimated Creatinine Clearance: 44.4 mL/min (A) (by C-G formula based on SCr of 2.04 mg/dL (H)).   Liver Function Tests: Recent Labs  Lab 01/09/19 1759  AST 26  ALT 45*  ALKPHOS 85  BILITOT 0.5  PROT 7.8  ALBUMIN 3.3*   Recent Labs  Lab 01/09/19 1759  LIPASE 49   Cardiac Enzymes: Recent Labs   Lab 01/09/19 1759 01/09/19 2354 01/10/19 0534 01/10/19 0923  TROPONINI 0.03* 0.04* 0.03* 0.03*   CBG: Recent Labs  Lab 01/13/19 2137 01/14/19 0224 01/14/19 0428 01/14/19 0907 01/14/19 1252  GLUCAP 153* 82 89 111* 118*   Urine analysis:    Component Value Date/Time   COLORURINE YELLOW 01/09/2019 2100   APPEARANCEUR HAZY (A) 01/09/2019 2100   LABSPEC 1.012 01/09/2019 2100   PHURINE 5.0 01/09/2019 2100   GLUCOSEU NEGATIVE 01/09/2019 2100   HGBUR SMALL (A) 01/09/2019 2100   BILIRUBINUR NEGATIVE 01/09/2019 2100   KETONESUR NEGATIVE 01/09/2019 2100   PROTEINUR NEGATIVE 01/09/2019 2100   UROBILINOGEN 0.2 12/24/2009 1944   NITRITE NEGATIVE 01/09/2019 2100   LEUKOCYTESUR NEGATIVE 01/09/2019 2100    Recent Results (from the past 240 hour(s))  SARS Coronavirus 2 (CEPHEID- Performed in [place] hospital lab), Hosp Order     Status: None   Collection Time: 01/09/19  6:26 PM  Result Value Ref Range Status   SARS Coronavirus 2 NEGATIVE NEGATIVE Final  Comment: (NOTE) If result is NEGATIVE SARS-CoV-2 target nucleic acids are NOT DETECTED. The SARS-CoV-2 RNA is generally detectable in upper and lower  respiratory specimens during the acute phase of infection. The lowest  concentration of SARS-CoV-2 viral copies this assay can detect is 250  copies / mL. A negative result does not preclude SARS-CoV-2 infection  and should not be used as the sole basis for treatment or other  patient management decisions.  A negative result may occur with  improper specimen collection / handling, submission of specimen other  than nasopharyngeal swab, presence of viral mutation(s) within the  areas targeted by this assay, and inadequate number of viral copies  (<250 copies / mL). A negative result must be combined with clinical  observations, patient history, and epidemiological information. If result is POSITIVE SARS-CoV-2 target nucleic acids are DETECTED. The SARS-CoV-2 RNA is generally  detectable in upper and lower  respiratory specimens dur ing the acute phase of infection.  Positive  results are indicative of active infection with SARS-CoV-2.  Clinical  correlation with patient history and other diagnostic information is  necessary to determine patient infection status.  Positive results do  not rule out bacterial infection or co-infection with other viruses. If result is PRESUMPTIVE POSTIVE SARS-CoV-2 nucleic acids MAY BE PRESENT.   A presumptive positive result was obtained on the submitted specimen  and confirmed on repeat testing.  While 2019 novel coronavirus  (SARS-CoV-2) nucleic acids may be present in the submitted sample  additional confirmatory testing may be necessary for epidemiological  and / or clinical management purposes  to differentiate between  SARS-CoV-2 and other Sarbecovirus currently known to infect humans.  If clinically indicated additional testing with an alternate test  methodology [phone]) is advised. The SARS-CoV-2 RNA is generally  detectable in upper and lower respiratory sp ecimens during the acute  phase of infection. The expected result is Negative. Fact Sheet for Patients:  StrictlyIdeas.no Fact Sheet for Healthcare Providers: BankingDealers.co.za This test is not yet approved or cleared by the [place] FDA and has been authorized for detection and/or diagnosis of SARS-CoV-2 by FDA under an Emergency Use Authorization (EUA).  This EUA will remain in effect (meaning this test can be used) for the duration of the COVID-19 declaration under Section 564(b)(1) of the Act, 21 U.S.C. section 360bbb-3(b)(1), unless the authorization is terminated or revoked sooner. Performed at [facility], [place] [address] [place], [place] [address]   Urine culture     Status: Abnormal   Collection Time: 01/09/19  9:31 PM  Result Value Ref Range Status   Specimen Description URINE, RANDOM   Final   Special Requests NONE  Final   Culture (A)  Final    <10,000 COLONIES/mL INSIGNIFICANT GROWTH Performed at [facility], [place] [address] [place], [place] [address]    Report Status 01/10/2019 FINAL  Final  Surgical pcr screen     Status: None   Collection Time: 01/11/19 10:18 PM  Result Value Ref Range Status   MRSA, PCR NEGATIVE NEGATIVE Final   Staphylococcus aureus NEGATIVE NEGATIVE Final    Comment: (NOTE) The Xpert SA Assay (FDA approved for NASAL specimens in patients 44 years of age and older), is one component of a comprehensive surveillance program. It is not intended to diagnose infection nor to guide or monitor treatment. Performed at [facility], [place] [address] [place],  [address]      Radiology Studies: No results found.  Scheduled Meds: . atorvastatin  10 mg Oral q1800  . folic acid  1 mg Oral Daily  . insulin aspart  0-9 Units Subcutaneous Q4H  . multivitamin with minerals  1 tablet Oral Daily  . [START ON 01/15/2019] pantoprazole  40 mg Intravenous Q12H  . sertraline  100 mg Oral q morning - 10a  . sucralfate  1 g Oral TID WC & HS  . tamsulosin  0.4 mg Oral QPM  . thiamine  100 mg Oral Daily   Or  . thiamine  100 mg Intravenous Daily  . traZODone  200 mg Oral QHS   Continuous Infusions: . sodium chloride 75 mL/hr at 01/14/19 0927  . cefTRIAXone (ROCEPHIN)  IV 1 g (01/14/19 0928)  . heparin 1,600 Units/hr (01/13/19 2229)  . pantoprozole (PROTONIX) infusion 8 mg/hr (01/14/19 0645)     LOS: 5 days    Time spent: 35 minutes.  More than 50% of the time has been in direct contact with the patient, coordinating care and discussing intervention plans with patient directly with the nursing staff.  Patient has continue experiencing ongoing midepigastric discomfort and odynophagia, at this moment only tolerating sips of fluids and experiencing excruciating pain in between pain medication management.  He expressed some response to the  combination of oral morphine along with IV morphine.  Case discussed with gastroenterology service, who has recommended resumption from the use of Carafate.  Continue IV PPI infusion and also IV heparin.  Discussed with GI service to when will be safe to resume home oral anticoagulation (mainly when the patient will be capable of tolerating pills safely).   Barton Dubois, MD [facility] Pager [phone]  01/14/2019, 2:24 PM

## 2019-01-14 NOTE — Progress Notes (Signed)
ANTICOAGULATION CONSULT NOTE - Follow Up Consult  Pharmacy Consult for heparin Indication: h/o Afib/VTE  Labs: Recent Labs    01/12/19 0310  01/13/19 0215 01/13/19 1108 01/14/19 0312  HGB 11.8*  --  11.8*  --  12.0*  HCT 34.6*  --  35.5*  --  36.2*  PLT 235  --  206  --  199  HEPARINUNFRC  --    < > <0.10* 0.34 0.65  CREATININE  --   --  2.04*  --   --    < > = values in this interval not displayed.    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  Started on IV heparin for Afib and h/o VTEs.  Reported noncompliant with PTA Eliquis.   S/p endoscopy 01/12/19,  found severe distal esophagitis, does not look infectious per GI.   Heparin resumed on 5/29  post endoscopy as appropriate per GI.  Heparin level this AM remains therapeutic, at 0.65 units/ml on Heparin IV infusion at 1600 units/hr.   H/H low/stable, pltc within normal limits. No bleeding reported.   Goal of Therapy:  Heparin level 0.3-0.7 units/ml   Plan:  - Continue Heparin at 1600 units/hr (16 ml/hr) -  Daily HL, CBC  -f/u when to restart oral anticoagulation  (h/o noncompliance pta noted)  Thank you for allowing pharmacy to be part of this patients care team.  Nicole Cella, [place] [phone] Please utilize Amion for appropriate phone number to reach the unit pharmacist (Melissa) 01/14/2019 8:26 AM

## 2019-01-14 NOTE — Progress Notes (Signed)
[facility] Gastroenterology Progress Note  Todd [name]. 63 y.o. 11/14/1955  CC: Esophagitis   Subjective: Continues to have severe substernal pain probably from esophagitis.  Able to tolerate liquid but not able to tolerate solid food.  Denies vomiting.  Last bowel movement 2 days ago.  Denies blood in the stool.  Yesterday's events noted.  ROS : Positive for chest pain.  Negative for shortness of breath.   Objective: Vital signs in last 24 hours: Vitals:   01/14/19 0024 01/14/19 0654  BP: 138/79 (!) 157/91  Pulse: 63 64  Resp: 16 18  Temp:  98.6 F (37 C)  SpO2: 91% 95%    Physical Exam:  General:  Alert, cooperative, no distress, appears stated age  Head:  Normocephalic, without obvious abnormality, atraumatic  Eyes:  , EOM's intact,   Lungs:   Clear to auscultation bilaterally, respirations unlabored  Heart:  Regular rate and rhythm, S1, S2 normal  Abdomen:   Soft, non-tender, bowel sounds present, no peritoneal signs.  Extremities: Extremities normal, atraumatic,trace  edema  Pulses: 2+ and symmetric    Lab Results: Recent Labs    01/13/19 0215  NA 141  K 3.4*  CL 110  CO2 20*  GLUCOSE 104*  BUN 21  CREATININE 2.04*  CALCIUM 8.2*   No results for input(s): AST, ALT, ALKPHOS, BILITOT, PROT, ALBUMIN in the last 72 hours. Recent Labs    01/13/19 0215 01/14/19 0312  WBC 6.5 6.6  HGB 11.8* 12.0*  HCT 35.5* 36.2*  MCV 96.7 96.8  PLT 206 199   No results for input(s): LABPROT, INR in the last 72 hours.    Assessment/Plan: -Severe distal esophagitis -Typical chest pain -Alcohol use disorder -opioid use   Recommendations ------------------------ -Continue PPI. -Biopsy results pending. -Restart Carafate -GI will follow tomorrow.   Otis Brace MD, [place] 01/14/2019, 10:36 AM  Contact #  [phone]

## 2019-01-15 ENCOUNTER — Encounter (HOSPITAL_COMMUNITY): Payer: Self-pay | Admitting: Gastroenterology

## 2019-01-15 LAB — GLUCOSE, CAPILLARY
Glucose-Capillary: 80 mg/dL (ref 70–99)
Glucose-Capillary: 90 mg/dL (ref 70–99)
Glucose-Capillary: 93 mg/dL (ref 70–99)
Glucose-Capillary: 111 mg/dL — ABNORMAL HIGH (ref 70–99)
Glucose-Capillary: 169 mg/dL — ABNORMAL HIGH (ref 70–99)
Glucose-Capillary: 79 mg/dL (ref 70–99)

## 2019-01-15 LAB — BASIC METABOLIC PANEL
Anion gap: 11 (ref 5–15)
BUN: 12 mg/dL (ref 8–23)
CO2: 23 mmol/L (ref 22–32)
Calcium: 8.1 mg/dL — ABNORMAL LOW (ref 8.9–10.3)
Chloride: 110 mmol/L (ref 98–111)
Creatinine, Ser: 1.78 mg/dL — ABNORMAL HIGH (ref 0.61–1.24)
GFR calc Af Amer: 46 mL/min — ABNORMAL LOW (ref >60)
GFR calc non Af Amer: 40 mL/min — ABNORMAL LOW (ref >60)
Glucose, Bld: 102 mg/dL — ABNORMAL HIGH (ref 70–99)
Potassium: 3.2 mmol/L — ABNORMAL LOW (ref 3.5–5.1)
Sodium: 144 mmol/L (ref 135–145)

## 2019-01-15 LAB — CBC
HCT: 35.1 % — ABNORMAL LOW (ref 39.0–52.0)
Hemoglobin: 11.7 g/dL — ABNORMAL LOW (ref 13.0–17.0)
MCH: 32.4 pg (ref 26.0–34.0)
MCHC: 33.3 g/dL (ref 30.0–36.0)
MCV: 97.2 fL (ref 80.0–100.0)
Platelets: 191 10*3/uL (ref 150–400)
RBC: 3.61 MIL/uL — ABNORMAL LOW (ref 4.22–5.81)
RDW: 13.8 % (ref 11.5–15.5)
WBC: 6.5 10*3/uL (ref 4.0–10.5)
nRBC: 0 % (ref 0.0–0.2)

## 2019-01-15 LAB — HEPARIN LEVEL (UNFRACTIONATED): Heparin Unfractionated: 0.68 IU/mL (ref 0.30–0.70)

## 2019-01-15 MED ORDER — LIDOCAINE VISCOUS HCL 2 % MT SOLN
15.0000 mL | Freq: Three times a day (TID) | OROMUCOSAL | Status: DC
  Administered 2019-01-15 – 2019-01-18 (×8): 15 mL via OROMUCOSAL
  Filled 2019-01-15 (×10): qty 15

## 2019-01-15 NOTE — Progress Notes (Signed)
Pt's BP was 178/100. Admin 5mg  Hydralazine IV. Will recheck BP

## 2019-01-15 NOTE — Progress Notes (Signed)
ANTICOAGULATION CONSULT NOTE - Follow Up Consult  Pharmacy Consult for Heparin Indication: atrial fibrillation and hx DVT, PE, clotting disorder  No Known Allergies  Patient Measurements: Height: 5\' 9"  (175.3 cm) Weight: 227 lb 11.8 oz (103.3 kg) IBW/kg (Calculated) : 70.7 Heparin Dosing Weight: 93 kg  Vital Signs: Temp: 98 F (36.7 C) (06/01 0835) Temp Source: Oral (06/01 0835) BP: 177/88 (06/01 0903) Pulse Rate: 63 (06/01 0903)  Labs: Recent Labs    01/13/19 0215 01/13/19 1108 01/14/19 0312 01/15/19 0528  HGB 11.8*  --  12.0* 11.7*  HCT 35.5*  --  36.2* 35.1*  PLT 206  --  199 191  HEPARINUNFRC <0.10* 0.34 0.65 0.68  CREATININE 2.04*  --   --  1.78*    Estimated Creatinine Clearance: 50.9 mL/min (A) (by C-G formula based on SCr of 1.78 mg/dL (H)).  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  Started on IV heparin 5/26.  Reportednoncompliant with PTA Eliquis.   S/p endoscopy 01/12/19,  found severe distal esophagitis, does not look infectious per GI.  Heparin resumed on 5/29  post endoscopy as appropriate per GI.  On Protonix drip, which is to transition to 40 mg IV q12h tonight.     Heparin level remains therapeutic (0.68) on 1600 units/hr. Upper range therapeutic. No bleeding reported. CBC stable.  Creatinine trending down.   Goal of Therapy:  Heparin level 0.3-0.7 units/ml Monitor platelets by anticoagulation protocol: Yes   Plan:   Decrease heparin drip to 1500 units/hr, to try to avoid trending above target range.  Daily heparin level and CBC.  Monitor for any s/sx bleeding.  Follow up for transition to oral anticoagulation.    Arty Baumgartner, [place] Pager: [phone] or phone: [phone] 01/15/2019,11:04 AM

## 2019-01-15 NOTE — Care Management Important Message (Signed)
Important Message  Patient Details  Name: Todd [name]. MRN: [id number] Date of Birth: 05-25-1956   Medicare Important Message Given:  Yes    [name] [address] 01/15/2019, 3:49 PM

## 2019-01-15 NOTE — Progress Notes (Signed)
PROGRESS NOTE    Todd [name].  KGM:[id number] DOB: 24-Apr-1956 DOA: 01/09/2019 PCP: [name], MD     Brief Narrative:   63 y.o. 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  Patient is not currently taking eliquis "though I should be".  Patient presents to the ED with c/o central to low CP / epigastric pain that is sharp and stabbing.  Worse with coughing and deep breaths and moving. Onset about 3 days ago.  Minor amount of cough.  Pain worsening especially with swallowing, poor PO intake over past couple of days.  ED Course: AKF with creat 4.1 and BUN 7.5 (1.0 and 18 in Jan this year).  EtOH level is <10.  UA with small blood otherwise neg.  Bladder scan only shows 28 ml.  CXR neg, COVID neg.  Tm 99.  Trop 0.03.   Assessment & Plan: 1-Acute kidney failure ([place]) -Continue minimizing Nephrotoxic agents -Continue to follow I's and O's. -Follow renal function in a.m. -Creatinine continue trending down and improving.  2-DM (diabetes mellitus), type 2 ([place]) -Avoid oral hypoglycemic agents while inpatient -Continue sliding scale insulin -A1c 6.4  3-Essential hypertension/tachycardia -Blood pressure overall stable -Continue PRN hydralazine and PRN lopressor.  4-CHEST PAIN -Atypical -Continue treatment for noncardiac causes including treatment for gastroesophageal reflux disease/esophagitis and pneumonia. -Will continue the use of PPI and as needed GI cocktail. -Appreciate gastroenterology service evaluation and recommendations. -Continue IV antibiotics, aiming for a total of 7 days of antibiotics. -Patient remains afebrile and denies shortness of breath. -Continue PRN pain medications -Continue Carafate and use viscous lidocaine. -Advance diet as tolerated.  5-Alcohol use disorder, severe, dependence ([place]) -will continue CIWA protocol -continue thiamine and folic acid -cessation counseling  provided.  6-Opioid use disorder, moderate, dependence ([place]) -continue current dose of narcotics -avoid overuse.  7-BPH -continue flomax  8-depression/anxiety -No suicidal ideation or hallucination -continue trazodone and Zoloft.  DVT prophylaxis: Heparin drip Code Status: Full code Family Communication: No family at bedside Disposition Plan: Continue IV antibiotics for concerns of pneumonia seen on CT scan, troponin with flat elevation and no abnormalities seen on telemetry or EKGs.  Continue Protonix, endoscopy with signs of acute bleeding; positive distal severe esophagitis and gastritis.  Having difficulty tolerating by mouth, only tolerating intermittent sips of clear liquids.  Resume the use of Carafate.  Consultants:   GI service  Procedures:   See below for x-ray reports.   EGD in 20  Antimicrobials:  Anti-infectives (From admission, onward)   Start     Dose/Rate Route Frequency Ordered Stop   01/10/19 1800  azithromycin (ZITHROMAX) tablet 250 mg     250 mg Oral Daily 01/09/19 2325 01/13/19 1736   01/10/19 0800  cefTRIAXone (ROCEPHIN) 1 g in sodium chloride 0.9 % 100 mL IVPB     1 g 200 mL/hr over 30 Minutes Intravenous Daily 01/10/19 0722     01/10/19 0000  azithromycin (ZITHROMAX) tablet 500 mg     500 mg Oral Daily 01/09/19 2325 01/10/19 0100      Subjective: No fever, no shortness of breath, no nausea, no vomiting.  Still having midepigastric discomfort and odynophagia; saw improvement with current medication management and use of Carafate.  Objective: Vitals:   01/15/19 0846 01/15/19 0903 01/15/19 1158 01/15/19 1159  BP: (!) 181/97 (!) 177/88 (!) 173/101 (!) 170/77  Pulse: 63 63  66  Resp: (!) 9 14    Temp:   98.2  F (36.8 C)   TempSrc:   Oral   SpO2: 98% 95% 97%   Weight:      Height:        Intake/Output Summary (Last 24 hours) at 01/15/2019 1555 Last data filed at 01/15/2019 0431 Gross per 24 hour  Intake 2678.91 ml  Output 1200 ml  Net  1478.91 ml   Filed Weights   01/09/19 1756 01/10/19 0000 01/12/19 0725  Weight: 102.1 kg 103.3 kg 103.3 kg    Examination: General exam: Alert, awake, oriented x 3; no fever, no nausea, no vomiting.  Continues to have mid epigastric pain and odynophagia.  Renal function continues to improve.  And patient also expressed having some improvement with current medication management.  Respiratory system: Clear to auscultation. Respiratory effort normal. Cardiovascular system:RRR. No murmurs, rubs, gallops. Gastrointestinal system: Abdomen is nondistended, soft and nontender. No organomegaly or masses felt. Normal bowel sounds heard. Central nervous system: Alert and oriented. No focal neurological deficits. Extremities: No cyanosis or clubbing.  Trace edema bilaterally. Skin: No rashes, lesions or ulcers Psychiatry: Judgement and insight appear normal. Mood & affect appropriate.    Data Reviewed: I have personally reviewed following labs and imaging studies  CBC: Recent Labs  Lab 01/09/19 1759  01/11/19 0247 01/12/19 0310 01/13/19 0215 01/14/19 0312 01/15/19 0528  WBC 5.3   < > 5.5 5.4 6.5 6.6 6.5  NEUTROABS 3.0  --   --   --   --   --   --   HGB 13.7   < > 11.4* 11.8* 11.8* 12.0* 11.7*  HCT 41.0   < > 33.5* 34.6* 35.5* 36.2* 35.1*  MCV 95.8   < > 94.6 95.8 96.7 96.8 97.2  PLT 265   < > 255 235 206 199 191   < > = values in this interval not displayed.   Basic Metabolic Panel: Recent Labs  Lab 01/09/19 1759 01/10/19 0534 01/11/19 0247 01/13/19 0215 01/15/19 0528  NA 140 143 143 141 144  K 4.0 3.1* 3.6 3.4* 3.2*  CL 107 117* 111 110 110  CO2 17* 18* 20* 20* 23  GLUCOSE 127* 134* 105* 104* 102*  BUN 75* 61* 48* 21 12  CREATININE 4.11* 3.11* 2.85* 2.04* 1.78*  CALCIUM 9.4 7.6* 8.5* 8.2* 8.1*  MG  --   --  1.6*  --   --    GFR: Estimated Creatinine Clearance: 50.9 mL/min (A) (by C-G formula based on SCr of 1.78 mg/dL (H)).   Liver Function Tests: Recent Labs  Lab  01/09/19 1759  AST 26  ALT 45*  ALKPHOS 85  BILITOT 0.5  PROT 7.8  ALBUMIN 3.3*   Recent Labs  Lab 01/09/19 1759  LIPASE 49   Cardiac Enzymes: Recent Labs  Lab 01/09/19 1759 01/09/19 2354 01/10/19 0534 01/10/19 0923  TROPONINI 0.03* 0.04* 0.03* 0.03*   CBG: Recent Labs  Lab 01/14/19 2007 01/15/19 0108 01/15/19 0430 01/15/19 0832 01/15/19 1156  GLUCAP 157* 80 90 93 111*   Urine analysis:    Component Value Date/Time   COLORURINE YELLOW 01/09/2019 2100   APPEARANCEUR HAZY (A) 01/09/2019 2100   LABSPEC 1.012 01/09/2019 2100   PHURINE 5.0 01/09/2019 2100   GLUCOSEU NEGATIVE 01/09/2019 2100   HGBUR SMALL (A) 01/09/2019 2100   BILIRUBINUR NEGATIVE 01/09/2019 2100   KETONESUR NEGATIVE 01/09/2019 2100   PROTEINUR NEGATIVE 01/09/2019 2100   UROBILINOGEN 0.2 12/24/2009 1944   NITRITE NEGATIVE 01/09/2019 2100   LEUKOCYTESUR NEGATIVE 01/09/2019 2100  Recent Results (from the past 240 hour(s))  SARS Coronavirus 2 (CEPHEID- Performed in [facility] hospital lab), Hosp Order     Status: None   Collection Time: 01/09/19  6:26 PM  Result Value Ref Range Status   SARS Coronavirus 2 NEGATIVE NEGATIVE Final    Comment: (NOTE) If result is NEGATIVE SARS-CoV-2 target nucleic acids are NOT DETECTED. The SARS-CoV-2 RNA is generally detectable in upper and lower  respiratory specimens during the acute phase of infection. The lowest  concentration of SARS-CoV-2 viral copies this assay can detect is 250  copies / mL. A negative result does not preclude SARS-CoV-2 infection  and should not be used as the sole basis for treatment or other  patient management decisions.  A negative result may occur with  improper specimen collection / handling, submission of specimen other  than nasopharyngeal swab, presence of viral mutation(s) within the  areas targeted by this assay, and inadequate number of viral copies  (<250 copies / mL). A negative result must be combined with clinical   observations, patient history, and epidemiological information. If result is POSITIVE SARS-CoV-2 target nucleic acids are DETECTED. The SARS-CoV-2 RNA is generally detectable in upper and lower  respiratory specimens dur ing the acute phase of infection.  Positive  results are indicative of active infection with SARS-CoV-2.  Clinical  correlation with patient history and other diagnostic information is  necessary to determine patient infection status.  Positive results do  not rule out bacterial infection or co-infection with other viruses. If result is PRESUMPTIVE POSTIVE SARS-CoV-2 nucleic acids MAY BE PRESENT.   A presumptive positive result was obtained on the submitted specimen  and confirmed on repeat testing.  While 2019 novel coronavirus  (SARS-CoV-2) nucleic acids may be present in the submitted sample  additional confirmatory testing may be necessary for epidemiological  and / or clinical management purposes  to differentiate between  SARS-CoV-2 and other Sarbecovirus currently known to infect humans.  If clinically indicated additional testing with an alternate test  methodology [phone]) is advised. The SARS-CoV-2 RNA is generally  detectable in upper and lower respiratory sp ecimens during the acute  phase of infection. The expected result is Negative. Fact Sheet for Patients:  StrictlyIdeas.no Fact Sheet for Healthcare Providers: BankingDealers.co.za This test is not yet approved or cleared by the [place] FDA and has been authorized for detection and/or diagnosis of SARS-CoV-2 by FDA under an Emergency Use Authorization (EUA).  This EUA will remain in effect (meaning this test can be used) for the duration of the COVID-19 declaration under Section 564(b)(1) of the Act, 21 U.S.C. section 360bbb-3(b)(1), unless the authorization is terminated or revoked sooner. Performed at [facility], [place] [address]  [place], [place] [address]   Urine culture     Status: Abnormal   Collection Time: 01/09/19  9:31 PM  Result Value Ref Range Status   Specimen Description URINE, RANDOM  Final   Special Requests NONE  Final   Culture (A)  Final    <10,000 COLONIES/mL INSIGNIFICANT GROWTH Performed at [facility], [place] [address] [place], [place] [address]    Report Status 01/10/2019 FINAL  Final  Surgical pcr screen     Status: None   Collection Time: 01/11/19 10:18 PM  Result Value Ref Range Status   MRSA, PCR NEGATIVE NEGATIVE Final   Staphylococcus aureus NEGATIVE NEGATIVE Final    Comment: (NOTE) The Xpert SA Assay (FDA approved for NASAL specimens in patients  63 years of age and older), is one component of a comprehensive surveillance program. It is not intended to diagnose infection nor to guide or monitor treatment. Performed at [facility], [place] [address] [place], [place] [address]      Radiology Studies: No results found.  Scheduled Meds: . atorvastatin  10 mg Oral q1800  . folic acid  1 mg Oral Daily  . insulin aspart  0-9 Units Subcutaneous Q4H  . lidocaine  15 mL Mouth/Throat TID AC  . multivitamin with minerals  1 tablet Oral Daily  . pantoprazole  40 mg Intravenous Q12H  . sertraline  100 mg Oral q morning - 10a  . sucralfate  1 g Oral TID WC & HS  . tamsulosin  0.4 mg Oral QPM  . thiamine  100 mg Oral Daily   Or  . thiamine  100 mg Intravenous Daily  . traZODone  200 mg Oral QHS   Continuous Infusions: . sodium chloride 75 mL/hr at 01/14/19 2254  . cefTRIAXone (ROCEPHIN)  IV 1 g (01/15/19 0900)  . heparin 1,500 Units/hr (01/15/19 1133)     LOS: 6 days    Time spent: 35 minutes.  More than 50% of the time has been in direct contact with the patient, coordinating care and discussing intubation plan with patient directly and with nursing staff.  Patient continued to experience significant midepigastric discomfort and odynophagia; but expressed improvement  with current pain regimen and the use of Carafate.  He is willing to have diet advance.  Appreciate assistance by gastroenterology service.  If diet tolerated will transition medications to oral route and discharge home in the next 1 to 2 days (while examined ability to truly tolerate by mouth).   Barton Dubois, MD [facility] Pager [phone]  01/15/2019, 3:55 PM

## 2019-01-15 NOTE — Progress Notes (Signed)
[name] 1:36 PM  Subjective: Patient seen and examined and his hospital computer chart reviewed and his case discussed with my partner Dr. B and specifically his endoscopy was reviewed and he is doing a little better from a swallowing standpoint and his chest pains a little better he has no new complaints  Objective: Vital signs stable afebrile he does have some midepigastric and chest wall tenderness pretty mild left nontender elsewhere labs stable biopsy shows some atypia and metaplasia but no obvious infection  Assessment: Severe reflux esophagitis  Plan: Will add some viscous lidocaine and slowly advance diet as tolerated and slowly transition him to oral pump inhibitors and please call me back if I can be of any further assistance with this hospital stay and he will need to follow-up with my partner Dr. Cristina Gong as an outpatient in a few weeks and he will need a repeat endoscopy to document healing and rebiopsy to see if Barrett's esophagus and dysplasia exists  [facility]  office [phone] After 5PM or if no answer call [phone]

## 2019-01-15 NOTE — Progress Notes (Signed)
Pt BP 177/88. Will continue to monitor.

## 2019-01-16 ENCOUNTER — Ambulatory Visit: Payer: Medicare Other | Admitting: Sports Medicine

## 2019-01-16 ENCOUNTER — Inpatient Hospital Stay (HOSPITAL_COMMUNITY): Payer: Medicare Other

## 2019-01-16 LAB — GLUCOSE, CAPILLARY
Glucose-Capillary: 111 mg/dL — ABNORMAL HIGH (ref 70–99)
Glucose-Capillary: 134 mg/dL — ABNORMAL HIGH (ref 70–99)
Glucose-Capillary: 152 mg/dL — ABNORMAL HIGH (ref 70–99)
Glucose-Capillary: 172 mg/dL — ABNORMAL HIGH (ref 70–99)
Glucose-Capillary: 173 mg/dL — ABNORMAL HIGH (ref 70–99)
Glucose-Capillary: 95 mg/dL (ref 70–99)

## 2019-01-16 LAB — CBC
HCT: 35.6 % — ABNORMAL LOW (ref 39.0–52.0)
Hemoglobin: 11.7 g/dL — ABNORMAL LOW (ref 13.0–17.0)
MCH: 31.8 pg (ref 26.0–34.0)
MCHC: 32.9 g/dL (ref 30.0–36.0)
MCV: 96.7 fL (ref 80.0–100.0)
Platelets: 210 10*3/uL (ref 150–400)
RBC: 3.68 MIL/uL — ABNORMAL LOW (ref 4.22–5.81)
RDW: 13.6 % (ref 11.5–15.5)
WBC: 6 10*3/uL (ref 4.0–10.5)
nRBC: 0 % (ref 0.0–0.2)

## 2019-01-16 LAB — HEPARIN LEVEL (UNFRACTIONATED): Heparin Unfractionated: 0.54 IU/mL (ref 0.30–0.70)

## 2019-01-16 MED ORDER — AMOXICILLIN-POT CLAVULANATE 875-125 MG PO TABS
1.0000 | ORAL_TABLET | Freq: Two times a day (BID) | ORAL | Status: DC
  Administered 2019-01-16 – 2019-01-17 (×2): 1 via ORAL
  Filled 2019-01-16 (×2): qty 1

## 2019-01-16 MED ORDER — APIXABAN 5 MG PO TABS
5.0000 mg | ORAL_TABLET | Freq: Two times a day (BID) | ORAL | Status: DC
  Administered 2019-01-16 – 2019-01-18 (×4): 5 mg via ORAL
  Filled 2019-01-16 (×4): qty 1

## 2019-01-16 NOTE — Progress Notes (Signed)
(  No charge--pt not seen)  I called pt on his telephone to advise him of esoph bx results, which showed no herpetic infection but just inflammation/ulceration/reactive atypia.    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  We will contact the patient to arrange a f/u office visit w/ me in about 3 weeks.  Please call me if questions.  Cleotis Nipper, M.D. Pager [phone] If no answer or after 5 PM call [phone]

## 2019-01-16 NOTE — Progress Notes (Signed)
PROGRESS NOTE    Todd [name].  TIR:[id number] DOB: Jan 07, 1956 DOA: 01/09/2019 PCP: [name], MD     Brief Narrative:   63 y.o. 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  Patient is not currently taking eliquis "though I should be".  Patient presents to the ED with c/o central to low CP / epigastric pain that is sharp and stabbing.  Worse with coughing and deep breaths and moving. Onset about 3 days ago.  Minor amount of cough.  Pain worsening especially with swallowing, poor PO intake over past couple of days.  ED Course: AKF with creat 4.1 and BUN 7.5 (1.0 and 18 in Jan this year).  EtOH level is <10.  UA with small blood otherwise neg.  Bladder scan only shows 28 ml.  CXR neg, COVID neg.  Tm 99.  Trop 0.03.   Assessment & Plan: 1-Acute kidney failure ([place]) -Continue minimizing Nephrotoxic agents -Continue to follow I's and O's. -Follow renal function in a.m. -Creatinine continue trending down and improving appropriately. -Advised to maintain adequate hydration.  2-DM (diabetes mellitus), type 2 ([place]) -Avoid oral hypoglycemic agents while inpatient -Continue sliding scale insulin -A1c 6.4  3-Essential hypertension/tachycardia -Blood pressure overall stable -Continue PRN hydralazine and PRN lopressor.  4-CHEST PAIN -Atypical -Continue treatment for noncardiac causes including treatment for gastroesophageal reflux disease/esophagitis and pneumonia. -Will continue the use of PPI and as needed GI cocktail. -Appreciate gastroenterology service evaluation and recommendations. -Continue IV antibiotics, aiming for a total of 7 days of antibiotics. Will transition to oral augmentin today and complete 4 more days. -Patient remains afebrile and denies shortness of breath. -Continue PRN pain medications -Continue Carafate and use viscous lidocaine as per GI rec's. -Advance diet as tolerated.   5-Alcohol use disorder, severe, dependence ([place]) -will continue CIWA protocol -continue thiamine and folic acid -cessation counseling provided.  6-Opioid use disorder, moderate, dependence ([place]) -continue current dose of narcotics -avoid overuse.  7-BPH -continue flomax  8-depression/anxiety -No suicidal ideation or hallucination -continue trazodone and Zoloft.  9-blood tinged sputum -with underlying PNA and use of heparin -will transition to home eliquis -Hgb stable -recheck CXR   DVT prophylaxis: Heparin drip Code Status: Full code Family Communication: No family at bedside Disposition Plan: Continue IV antibiotics for concerns of pneumonia seen on CT scan, troponin with flat elevation and no abnormalities seen on telemetry or EKGs.  Continue Protonix, endoscopy with signs of acute bleeding; positive distal severe esophagitis and gastritis.  Having difficulty tolerating by mouth; but eager to start advancing diet and taking PO meds..  Consultants:   GI service  Procedures:   See below for x-ray reports.   EGD in 20  Antimicrobials:  Anti-infectives (From admission, onward)   Start     Dose/Rate Route Frequency Ordered Stop   01/16/19 2200  amoxicillin-clavulanate (AUGMENTIN) 875-125 MG per tablet 1 tablet     1 tablet Oral Every 12 hours 01/16/19 1731 01/20/19 2159   01/10/19 1800  azithromycin (ZITHROMAX) tablet 250 mg     250 mg Oral Daily 01/09/19 2325 01/13/19 1736   01/10/19 0800  cefTRIAXone (ROCEPHIN) 1 g in sodium chloride 0.9 % 100 mL IVPB  Status:  Discontinued     1 g 200 mL/hr over 30 Minutes Intravenous Daily 01/10/19 0722 01/16/19 1731   01/10/19 0000  azithromycin (ZITHROMAX) tablet 500 mg     500 mg Oral Daily 01/09/19 2325 01/10/19 0100  Subjective: No fever, no shortness of breath, no nausea, no vomiting.  Still having mid epigastric discomfort and odynophagia; patient expressed mild tinged blood sputum after coughing spells overnight.    Objective: Vitals:   01/16/19 0800 01/16/19 0935 01/16/19 1206 01/16/19 1605  BP: (!) 171/92 (!) 176/91 (!) 154/84 (!) 164/87  Pulse: 64 69 69 76  Resp: (!) 9 18 15 17   Temp:   98.5 F (36.9 C)   TempSrc:   Oral   SpO2: 93% 93% 97% 99%  Weight:      Height:        Intake/Output Summary (Last 24 hours) at 01/16/2019 1732 Last data filed at 01/16/2019 1657 Gross per 24 hour  Intake 1466.41 ml  Output 1500 ml  Net -33.59 ml   Filed Weights   01/09/19 1756 01/10/19 0000 01/12/19 0725  Weight: 102.1 kg 103.3 kg 103.3 kg    Examination: General exam: Alert, awake, oriented x 3; reports an improvement in discomfort.  But is still having severe epigastric pain and odynophagia.  Afraid to eat and also reported mild blood-tinged sputum after hacking cough throughout the night.  Patient is afebrile and otherwise in no acute distress. Respiratory system: Clear to auscultation. Respiratory effort normal. Cardiovascular system:RRR. No murmurs, rubs, gallops. Gastrointestinal system: Abdomen is nondistended, soft and nontender. No organomegaly or masses felt. Normal bowel sounds heard. Central nervous system: Alert and oriented. No focal neurological deficits. Extremities: No C/C/E, +pedal pulses Skin: No rashes, lesions or ulcers Psychiatry: Judgement and insight appear normal. Mood & affect appropriate.    Data Reviewed: I have personally reviewed following labs and imaging studies  CBC: Recent Labs  Lab 01/09/19 1759  01/12/19 0310 01/13/19 0215 01/14/19 0312 01/15/19 0528 01/16/19 0249  WBC 5.3   < > 5.4 6.5 6.6 6.5 6.0  NEUTROABS 3.0  --   --   --   --   --   --   HGB 13.7   < > 11.8* 11.8* 12.0* 11.7* 11.7*  HCT 41.0   < > 34.6* 35.5* 36.2* 35.1* 35.6*  MCV 95.8   < > 95.8 96.7 96.8 97.2 96.7  PLT 265   < > 235 206 199 191 210   < > = values in this interval not displayed.   Basic Metabolic Panel: Recent Labs  Lab 01/09/19 1759 01/10/19 0534 01/11/19 0247 01/13/19  0215 01/15/19 0528  NA 140 143 143 141 144  K 4.0 3.1* 3.6 3.4* 3.2*  CL 107 117* 111 110 110  CO2 17* 18* 20* 20* 23  GLUCOSE 127* 134* 105* 104* 102*  BUN 75* 61* 48* 21 12  CREATININE 4.11* 3.11* 2.85* 2.04* 1.78*  CALCIUM 9.4 7.6* 8.5* 8.2* 8.1*  MG  --   --  1.6*  --   --    GFR: Estimated Creatinine Clearance: 50.9 mL/min (A) (by C-G formula based on SCr of 1.78 mg/dL (H)).   Liver Function Tests: Recent Labs  Lab 01/09/19 1759  AST 26  ALT 45*  ALKPHOS 85  BILITOT 0.5  PROT 7.8  ALBUMIN 3.3*   Recent Labs  Lab 01/09/19 1759  LIPASE 49   Cardiac Enzymes: Recent Labs  Lab 01/09/19 1759 01/09/19 2354 01/10/19 0534 01/10/19 0923  TROPONINI 0.03* 0.04* 0.03* 0.03*   CBG: Recent Labs  Lab 01/16/19 0001 01/16/19 0412 01/16/19 0753 01/16/19 1205 01/16/19 1701  GLUCAP 152* 95 111* 173* 172*   Urine analysis:    Component Value Date/Time  COLORURINE YELLOW 01/09/2019 2100   APPEARANCEUR HAZY (A) 01/09/2019 2100   LABSPEC 1.012 01/09/2019 2100   PHURINE 5.0 01/09/2019 2100   GLUCOSEU NEGATIVE 01/09/2019 2100   HGBUR SMALL (A) 01/09/2019 2100   BILIRUBINUR NEGATIVE 01/09/2019 2100   KETONESUR NEGATIVE 01/09/2019 2100   PROTEINUR NEGATIVE 01/09/2019 2100   UROBILINOGEN 0.2 12/24/2009 1944   NITRITE NEGATIVE 01/09/2019 2100   LEUKOCYTESUR NEGATIVE 01/09/2019 2100    Recent Results (from the past 240 hour(s))  SARS Coronavirus 2 (CEPHEID- Performed in [facility] hospital lab), Hosp Order     Status: None   Collection Time: 01/09/19  6:26 PM  Result Value Ref Range Status   SARS Coronavirus 2 NEGATIVE NEGATIVE Final    Comment: (NOTE) If result is NEGATIVE SARS-CoV-2 target nucleic acids are NOT DETECTED. The SARS-CoV-2 RNA is generally detectable in upper and lower  respiratory specimens during the acute phase of infection. The lowest  concentration of SARS-CoV-2 viral copies this assay can detect is 250  copies / mL. A negative result does not  preclude SARS-CoV-2 infection  and should not be used as the sole basis for treatment or other  patient management decisions.  A negative result may occur with  improper specimen collection / handling, submission of specimen other  than nasopharyngeal swab, presence of viral mutation(s) within the  areas targeted by this assay, and inadequate number of viral copies  (<250 copies / mL). A negative result must be combined with clinical  observations, patient history, and epidemiological information. If result is POSITIVE SARS-CoV-2 target nucleic acids are DETECTED. The SARS-CoV-2 RNA is generally detectable in upper and lower  respiratory specimens dur ing the acute phase of infection.  Positive  results are indicative of active infection with SARS-CoV-2.  Clinical  correlation with patient history and other diagnostic information is  necessary to determine patient infection status.  Positive results do  not rule out bacterial infection or co-infection with other viruses. If result is PRESUMPTIVE POSTIVE SARS-CoV-2 nucleic acids MAY BE PRESENT.   A presumptive positive result was obtained on the submitted specimen  and confirmed on repeat testing.  While 2019 novel coronavirus  (SARS-CoV-2) nucleic acids may be present in the submitted sample  additional confirmatory testing may be necessary for epidemiological  and / or clinical management purposes  to differentiate between  SARS-CoV-2 and other Sarbecovirus currently known to infect humans.  If clinically indicated additional testing with an alternate test  methodology [phone]) is advised. The SARS-CoV-2 RNA is generally  detectable in upper and lower respiratory sp ecimens during the acute  phase of infection. The expected result is Negative. Fact Sheet for Patients:  StrictlyIdeas.no Fact Sheet for Healthcare Providers: BankingDealers.co.za This test is not yet approved or cleared by  the [place] FDA and has been authorized for detection and/or diagnosis of SARS-CoV-2 by FDA under an Emergency Use Authorization (EUA).  This EUA will remain in effect (meaning this test can be used) for the duration of the COVID-19 declaration under Section 564(b)(1) of the Act, 21 U.S.C. section 360bbb-3(b)(1), unless the authorization is terminated or revoked sooner. Performed at [facility], [place] [address], [place], [place] [address]   Urine culture     Status: Abnormal   Collection Time: 01/09/19  9:31 PM  Result Value Ref Range Status   Specimen Description URINE, RANDOM  Final   Special Requests NONE  Final   Culture (A)  Final    <10,000 COLONIES/mL INSIGNIFICANT GROWTH  Performed at [facility], [place] [address], [place], [place] [address]    Report Status 01/10/2019 FINAL  Final  Surgical pcr screen     Status: None   Collection Time: 01/11/19 10:18 PM  Result Value Ref Range Status   MRSA, PCR NEGATIVE NEGATIVE Final   Staphylococcus aureus NEGATIVE NEGATIVE Final    Comment: (NOTE) The Xpert SA Assay (FDA approved for NASAL specimens in patients 38 years of age and older), is one component of a comprehensive surveillance program. It is not intended to diagnose infection nor to guide or monitor treatment. Performed at [facility], [place] [address], [place], [place] [address]      Radiology Studies: No results found.  Scheduled Meds: . amoxicillin-clavulanate  1 tablet Oral Q12H  . apixaban  5 mg Oral BID  . atorvastatin  10 mg Oral q1800  . folic acid  1 mg Oral Daily  . insulin aspart  0-9 Units Subcutaneous Q4H  . lidocaine  15 mL Mouth/Throat TID AC  . multivitamin with minerals  1 tablet Oral Daily  . pantoprazole  40 mg Intravenous Q12H  . sertraline  100 mg Oral q morning - 10a  . sucralfate  1 g Oral TID WC & HS  . tamsulosin  0.4 mg Oral QPM  . thiamine  100 mg Oral Daily   Or  . thiamine  100 mg Intravenous Daily  .  traZODone  200 mg Oral QHS   Continuous Infusions: . sodium chloride 75 mL/hr at 01/16/19 1619     LOS: 7 days    Time spent: 35 minutes.     Barton Dubois, MD [facility] Pager [phone]  01/16/2019, 5:32 PM

## 2019-01-16 NOTE — Progress Notes (Signed)
ANTICOAGULATION CONSULT NOTE - Follow Up Consult  Pharmacy Consult for Heparin Indication: atrial fibrillation and hx DVT, PE, clotting disorder  No Known Allergies  Patient Measurements: Height: 5\' 9"  (175.3 cm) Weight: 227 lb 11.8 oz (103.3 kg) IBW/kg (Calculated) : 70.7 Heparin Dosing Weight: 93 kg  Vital Signs: Temp: 98.3 F (36.8 C) (06/02 0414) Temp Source: Oral (06/02 0414) BP: 164/96 (06/02 0414) Pulse Rate: 57 (06/02 0600)  Labs: Recent Labs    01/14/19 0312 01/15/19 0528 01/16/19 0249  HGB 12.0* 11.7* 11.7*  HCT 36.2* 35.1* 35.6*  PLT 199 191 210  HEPARINUNFRC 0.65 0.68 0.54  CREATININE  --  1.78*  --     Estimated Creatinine Clearance: 50.9 mL/min (A) (by C-G formula based on SCr of 1.78 mg/dL (H)).  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  Started on IV heparin 5/26.  Reportednoncompliant with PTA Eliquis.   S/p endoscopy 01/12/19,  found severe distal esophagitis, does not look infectious per GI.  Heparin resumed on 5/29  post endoscopy as appropriate per GI.    Heparin level this morning remains therapeutic (HL 0.54 << 0.68, goal of 0.3-0.7). CBC stable - no bleeding noted.   Goal of Therapy:  Heparin level 0.3-0.7 units/ml Monitor platelets by anticoagulation protocol: Yes   Plan:  - Continue Heparin at 1500 units/hr (15 ml/hr) - Will f/u on transition to oral anticoagulation - Will continue to monitor for any signs/symptoms of bleeding and will follow up with heparin level in the a.m.   Thank you for allowing pharmacy to be a part of this patient's care.  Alycia Rossetti, PharmD, BCPS Clinical Pharmacist Clinical phone for 01/16/2019: [phone] 01/16/2019 7:55 AM   **Pharmacist phone directory can now be found on [email or web address] ([email or web address]).  Listed under [place].

## 2019-01-17 DIAGNOSIS — K209 Esophagitis, unspecified: Secondary | ICD-10-CM

## 2019-01-17 LAB — BASIC METABOLIC PANEL
Anion gap: 11 (ref 5–15)
BUN: 15 mg/dL (ref 8–23)
CO2: 28 mmol/L (ref 22–32)
Calcium: 8.4 mg/dL — ABNORMAL LOW (ref 8.9–10.3)
Chloride: 105 mmol/L (ref 98–111)
Creatinine, Ser: 1.62 mg/dL — ABNORMAL HIGH (ref 0.61–1.24)
GFR calc Af Amer: 52 mL/min — ABNORMAL LOW (ref >60)
GFR calc non Af Amer: 45 mL/min — ABNORMAL LOW (ref >60)
Glucose, Bld: 105 mg/dL — ABNORMAL HIGH (ref 70–99)
Potassium: 3.1 mmol/L — ABNORMAL LOW (ref 3.5–5.1)
Sodium: 144 mmol/L (ref 135–145)

## 2019-01-17 LAB — CBC
HCT: 35.6 % — ABNORMAL LOW (ref 39.0–52.0)
Hemoglobin: 11.8 g/dL — ABNORMAL LOW (ref 13.0–17.0)
MCH: 32.2 pg (ref 26.0–34.0)
MCHC: 33.1 g/dL (ref 30.0–36.0)
MCV: 97 fL (ref 80.0–100.0)
Platelets: 198 10*3/uL (ref 150–400)
RBC: 3.67 MIL/uL — ABNORMAL LOW (ref 4.22–5.81)
RDW: 13.5 % (ref 11.5–15.5)
WBC: 5.1 10*3/uL (ref 4.0–10.5)
nRBC: 0 % (ref 0.0–0.2)

## 2019-01-17 LAB — GLUCOSE, CAPILLARY
Glucose-Capillary: 117 mg/dL — ABNORMAL HIGH (ref 70–99)
Glucose-Capillary: 118 mg/dL — ABNORMAL HIGH (ref 70–99)
Glucose-Capillary: 125 mg/dL — ABNORMAL HIGH (ref 70–99)
Glucose-Capillary: 149 mg/dL — ABNORMAL HIGH (ref 70–99)
Glucose-Capillary: 191 mg/dL — ABNORMAL HIGH (ref 70–99)
Glucose-Capillary: 91 mg/dL (ref 70–99)

## 2019-01-17 MED ORDER — MORPHINE SULFATE (PF) 2 MG/ML IV SOLN
2.0000 mg | Freq: Four times a day (QID) | INTRAVENOUS | Status: DC | PRN
  Administered 2019-01-17 (×2): 2 mg via INTRAVENOUS
  Filled 2019-01-17 (×2): qty 1

## 2019-01-17 MED ORDER — OXYCODONE HCL 5 MG PO TABS
5.0000 mg | ORAL_TABLET | ORAL | Status: DC | PRN
  Administered 2019-01-17 – 2019-01-18 (×3): 10 mg via ORAL
  Filled 2019-01-17 (×3): qty 2

## 2019-01-17 MED ORDER — POTASSIUM CHLORIDE CRYS ER 20 MEQ PO TBCR
40.0000 meq | EXTENDED_RELEASE_TABLET | ORAL | Status: AC
  Administered 2019-01-17 (×2): 40 meq via ORAL
  Filled 2019-01-17 (×2): qty 2

## 2019-01-17 NOTE — Consult Note (Signed)
   [facility] CM Inpatient Consult   01/17/2019  Todd [name]. Oct 22, 1955 [id number]    Update note:  Patient assessed for high risk score of 28% for unplanned readmission, with 3 ED visits and 1 hospitalization in the past 6 months; and to check for potential [place] care management services needed as benefit of his Medicare/ Next[employer] plan.  Review of patient's medical record and history & physical dated 01/09/19 show as:  [name]. is a 63 y.o. 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 He has not had ongoing GI problems despite a history of significant ethanol consumption.    Patient presented to the ED with c/o central to low CP/ epigastric pain that is sharp and stabbing.  Worse with coughing and deep breaths and moving. Pain worsening especially with swallowing, poor PO intake over past couple of days.  He was admitted to the hospital with a roughly 1 week history of intense chest pain and upper abdominal pain, associated with excruciating odynophagia and secondary dysphagia, and basically inability to eat for the past 4 days- even small sips of water are very uncomfortable (chest pain and dysphagia; acute kidney failure).  He had upper endoscopy that showed severe distal esophagitis and gastritis. He was also treated with antibiotics for pneumonia.  Primary care provider is Dr. [name] with [facility], listed as providing transition of care follow-up.  Review of PT note stating lengthy conversation about SNF vs. Home and patient reports wanting to go home at d/c with sister coming to stay for the weekend. Spoke to transition of care CM while in patient's room,  regarding patient's disposition and needs (home with home health services). Discussed  with TOC RN CM of patient's high risk care needs and made aware of patient's eligibility for [facility] program. Per transition of care CM, [facility] First Program offered, patient agreeable, and referral made.  Notified [facility] staff of patient accepting this program.   Of note, [facility] Care Management services does not replace or interfere with any services that are arranged by transition of care case management or social work.  For questions and additional information, please call:  Lundon Rosier A. Derrall Hicks, BSN, RN-BC [facility] Liaison Cell: [phone]

## 2019-01-17 NOTE — Discharge Instructions (Signed)

## 2019-01-17 NOTE — TOC Initial Note (Addendum)
Transition of Care (TOC) - Initial/Assessment Note    Patient Details  Name: Todd [name]. MRN: [id number] Date of Birth: [birth year]  Transition of Care [facility]) CM/SW Contact:    Sharin Mons, RN Phone Number: 01/17/2019, 2:25 PM  Clinical Narrative:      Admitted with ARF.From home alone states has 2 supportive sons and friend Westley Gambles (children's mom). PTA independent with ADL's, no DME usage.      Tyrone Schimke (Other)      [phone]        PT/OT evaluations pending .Marland KitchenMarland KitchenNCM to f/u with TOC needs .Marland Kitchen...   Pt has transportation to home. States has had no problem affording Rx meds.   01/17/2019 @ 14:30 Choice list provided to pt regarding home health services per NCM. Pt selected  [facility] for home health service pending MD's orders.  01/17/2019 @ 3:20 pm Referral made for DME rolling walker with [employer]. Equipment will be delivered to bedside prior to discharged.  01/17/2019 Per PT's evaluation pt could benefit from SNF however pt declined .... [facility] program offered, pt agreeable, referral made and accepted. [facility] made aware.  Expected Discharge Plan: Home/Self Care Barriers to Discharge: Continued Medical Work up   Patient Goals and CMS Choice Patient states their goals for this hospitalization and ongoing recovery are:: to go home and gain independence CMS Medicare.gov Compare Post Acute Care list provided to:: Patient    Expected Discharge Plan and Services Expected Discharge Plan: Home/Self Care   Discharge Planning Services: CM Consult   Living arrangements for the past 2 months: Apartment                                      Prior Living Arrangements/Services Living arrangements for the past 2 months: Apartment Lives with:: Self Patient language and need for interpreter reviewed:: Yes Do you feel safe going back to the place where you live?: Yes      Need for Family Participation in Patient Care: No  (Comment) Care giver support system in place?: No (comment)   Criminal Activity/Legal Involvement Pertinent to Current Situation/Hospitalization: No - Comment as needed  Activities of Daily Living      Permission Sought/Granted                  Emotional Assessment   Attitude/Demeanor/Rapport: Engaged Affect (typically observed): Accepting Orientation: : Oriented to Self, Oriented to Place, Oriented to  Time, Oriented to Situation Alcohol / Substance Use: Not Applicable Psych Involvement: No (comment)  Admission diagnosis:  Upper abdominal pain [R10.10] AKI (acute kidney injury) ([place]) [N17.9] Nonspecific chest pain [R07.9] Patient Active Problem List   Diagnosis Date Noted  . Acute kidney failure ([place]) 01/09/2019  . Pulmonary embolus and infarction ([place]) 07/13/2018  . Opioid use disorder, moderate, dependence ([place]) 06/29/2018  . Alcohol use disorder, severe, dependence ([place]) 06/27/2018  . MDD (major depressive disorder), recurrent severe, without psychosis ([place]) 06/26/2018  . Varicose veins of left lower extremity with complications 90/24/0973  . Benign paroxysmal positional vertigo 11/08/2013  . RBBB 06/02/2009  . 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 DM (diabetes mellitus), type 2 ([place]) 04/11/2009  . OBESITY 04/11/2009  . DEPRESSION 04/11/2009  . Essential hypertension 04/11/2009  . DIVERTICULAR DISEASE 04/11/2009  . CHOLECYSTITIS 04/11/2009  . CHEST PAIN 04/11/2009  . ABDOMINAL PAIN 04/11/2009   PCP:  [name], MD Pharmacy:  [place], [place] - [address] [place] [address] Phone: [phone] Fax: [place], [place] - [address] [place] [address] Phone: [phone] Fax: [phone]     Social Determinants of Health (SDOH) Interventions    Readmission Risk Interventions No flowsheet data  found.

## 2019-01-17 NOTE — Progress Notes (Signed)
Patient ID: Todd [name]., male   DOB: 08/21/1955, 63 y.o.   MRN: [id number]  PROGRESS NOTE    Todd [name].  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  He was found to have acute kidney injury with creatinine of 4.11.  COVID-19 testing was negative.  He was treated with IV fluids.  GI was consulted and he underwent upper GI endoscopy which showed severe esophagitis and gastritis.  He was also treated with antibiotics for pneumonia.  Assessment & Plan:   Principal Problem:   Acute kidney failure (HCC) Active Problems:   DM (diabetes mellitus), type 2 ([place])   Essential hypertension   CHEST PAIN   Alcohol use disorder, severe, dependence ([place])   Opioid use disorder, moderate, dependence (HCC)  Acute renal failure -Probably prerenal from dehydration -Presented with creatinine of 4.11 -Creatinine 1.62 today. -Treated with IV fluids. -DC IV fluids.  Encourage the patient to improve oral intake  Severe erosive esophagitis with gastritis Atypical chest pain probably from above Painful swallowing -Status post EGD on 01/12/2019 which showed severe erosive esophagitis with gastritis.  GI has signed off.  Outpatient follow-up with GI.  Esophageal biopsy apparently showed no herpetic infection but just inflammation/ulceration/reactive atypia as per GI -Continue Protonix with sucralfate along with Viscous Lidocaine -Patient still does not feel well and appetite is not that great.  We will continue to encourage oral intake.  Hopefully he feels better by tomorrow and we can discharge him tomorrow.  Alcohol abuse with severe dependence -Continue thiamine and folic acid.  Continue CIWA protocol.  Cessation counseling was  provided during the hospitalization  Hypokalemia -Replace.  Repeat a.m. labs  Probable bacterial pneumonia -Treated with IV antibiotics and has been switched to oral Augmentin.  Today is day #8.  DC antibiotics.  Opiate use disorder, with dependence -Outpatient follow-up with pain management.  Try and use oral short-acting pain medications and avoid IV as much as possible.  BPH--continue Flomax  Depression/anxiety -No suicidal ideation or hallucination -Continue trazodone and Zoloft  History of recurrent DVTs and PE -Heparin drip has been switched to Eliquis.  Hemoglobin stable  Generalized deconditioning -PT eval.  DVT prophylaxis: Eliquis Code Status: Full Family Communication: None at bedside Disposition Plan: Home tomorrow if clinically improves  Consultants: GI Procedures:  EGD Impression:               - Normal larynx.                           - Severe erosive esophagitis, nonspecific in                            appearance so etiology is unclear, but not classic                            for infectious esophagitis. This readily explains                            the patient's severe chest pain and esophageal  symptoms. Biopsied.                           - Small hiatal hernia.                           - Erythematous mucosa in the antrum.                           - A single papule (nodule) found in the stomach,                            probably gastritis-associated mucosal edema.                            Biopsied.                           - Normal examined duodenum. Recommendation:           - Continue present medications. Supportive care                            including pain medication. Liquid diet. Suspect                            esophagitis will resolve spontaneously over the                            next week. Based on endoscopic appearance, no need                            for initiation of empiric  anti-infective therapy                            for viral or fungal esophagitis while awaiting                            biopsy results.                           - Await pathology results and treat accordingly if                            a specific cause for the esophagitis is found. Antimicrobials:  Anti-infectives (From admission, onward)   Start     Dose/Rate Route Frequency Ordered Stop   01/16/19 2200  amoxicillin-clavulanate (AUGMENTIN) 875-125 MG per tablet 1 tablet  Status:  Discontinued     1 tablet Oral Every 12 hours 01/16/19 1731 01/17/19 1055   01/10/19 1800  azithromycin (ZITHROMAX) tablet 250 mg     250 mg Oral Daily 01/09/19 2325 01/13/19 1736   01/10/19 0800  cefTRIAXone (ROCEPHIN) 1 g in sodium chloride 0.9 % 100 mL IVPB  Status:  Discontinued     1 g 200 mL/hr over 30 Minutes Intravenous Daily 01/10/19 0722 01/16/19 1731   01/10/19 0000  azithromycin (ZITHROMAX) tablet 500 mg     500 mg Oral Daily 01/09/19  2325 01/10/19 0100      Subjective: Patient seen and examined at bedside.  He does not feel well.  His oral intake is very poor.  He does not think that he is ready for discharge today.  No overnight fever or vomiting.  Objective: Vitals:   01/16/19 2034 01/17/19 0430 01/17/19 0445 01/17/19 0526  BP:   (!) 161/78 (!) 155/82  Pulse:   (!) 57 (!) 57  Resp:      Temp: 98.6 F (37 C) 98.6 F (37 C)    TempSrc:      SpO2:   91% 90%  Weight:      Height:        Intake/Output Summary (Last 24 hours) at 01/17/2019 1137 Last data filed at 01/17/2019 0842 Gross per 24 hour  Intake 2106.06 ml  Output 2150 ml  Net -43.94 ml   Filed Weights   01/09/19 1756 01/10/19 0000 01/12/19 0725  Weight: 102.1 kg 103.3 kg 103.3 kg    Examination:  General exam: Appears calm and comfortable.  Looks older than stated age.  Gets intermittently upset and angry. Respiratory system: Bilateral decreased breath sounds at bases with some scattered crackles Cardiovascular  system: S1 & S2 heard, Rate controlled Gastrointestinal system: Abdomen is nondistended, soft and nontender. Normal bowel sounds heard. Extremities: No cyanosis, clubbing, edema    Data Reviewed: I have personally reviewed following labs and imaging studies  CBC: Recent Labs  Lab 01/13/19 0215 01/14/19 0312 01/15/19 0528 01/16/19 0249 01/17/19 0312  WBC 6.5 6.6 6.5 6.0 5.1  HGB 11.8* 12.0* 11.7* 11.7* 11.8*  HCT 35.5* 36.2* 35.1* 35.6* 35.6*  MCV 96.7 96.8 97.2 96.7 97.0  PLT 206 199 191 210 008   Basic Metabolic Panel: Recent Labs  Lab 01/11/19 0247 01/13/19 0215 01/15/19 0528 01/17/19 0312  NA 143 141 144 144  K 3.6 3.4* 3.2* 3.1*  CL 111 110 110 105  CO2 20* 20* 23 28  GLUCOSE 105* 104* 102* 105*  BUN 48* 21 12 15   CREATININE 2.85* 2.04* 1.78* 1.62*  CALCIUM 8.5* 8.2* 8.1* 8.4*  MG 1.6*  --   --   --    GFR: Estimated Creatinine Clearance: 56 mL/min (A) (by C-G formula based on SCr of 1.62 mg/dL (H)). Liver Function Tests: No results for input(s): AST, ALT, ALKPHOS, BILITOT, PROT, ALBUMIN in the last 168 hours. No results for input(s): LIPASE, AMYLASE in the last 168 hours. No results for input(s): AMMONIA in the last 168 hours. Coagulation Profile: No results for input(s): INR, PROTIME in the last 168 hours. Cardiac Enzymes: No results for input(s): CKTOTAL, CKMB, CKMBINDEX, TROPONINI in the last 168 hours. BNP (last 3 results) No results for input(s): PROBNP in the last 8760 hours. HbA1C: No results for input(s): HGBA1C in the last 72 hours. CBG: Recent Labs  Lab 01/16/19 1701 01/16/19 2033 01/17/19 0047 01/17/19 0442 01/17/19 0805  GLUCAP 172* 134* 149* 91 125*   Lipid Profile: No results for input(s): CHOL, HDL, LDLCALC, TRIG, CHOLHDL, LDLDIRECT in the last 72 hours. Thyroid Function Tests: No results for input(s): TSH, T4TOTAL, FREET4, T3FREE, THYROIDAB in the last 72 hours. Anemia Panel: No results for input(s): VITAMINB12, FOLATE, FERRITIN,  TIBC, IRON, RETICCTPCT in the last 72 hours. Sepsis Labs: No results for input(s): PROCALCITON, LATICACIDVEN in the last 168 hours.  Recent Results (from the past 240 hour(s))  SARS Coronavirus 2 (CEPHEID- Performed in [facility] hospital lab), Hosp Order     Status: None  Collection Time: 01/09/19  6:26 PM  Result Value Ref Range Status   SARS Coronavirus 2 NEGATIVE NEGATIVE Final    Comment: (NOTE) If result is NEGATIVE SARS-CoV-2 target nucleic acids are NOT DETECTED. The SARS-CoV-2 RNA is generally detectable in upper and lower  respiratory specimens during the acute phase of infection. The lowest  concentration of SARS-CoV-2 viral copies this assay can detect is 250  copies / mL. A negative result does not preclude SARS-CoV-2 infection  and should not be used as the sole basis for treatment or other  patient management decisions.  A negative result may occur with  improper specimen collection / handling, submission of specimen other  than nasopharyngeal swab, presence of viral mutation(s) within the  areas targeted by this assay, and inadequate number of viral copies  (<250 copies / mL). A negative result must be combined with clinical  observations, patient history, and epidemiological information. If result is POSITIVE SARS-CoV-2 target nucleic acids are DETECTED. The SARS-CoV-2 RNA is generally detectable in upper and lower  respiratory specimens dur ing the acute phase of infection.  Positive  results are indicative of active infection with SARS-CoV-2.  Clinical  correlation with patient history and other diagnostic information is  necessary to determine patient infection status.  Positive results do  not rule out bacterial infection or co-infection with other viruses. If result is PRESUMPTIVE POSTIVE SARS-CoV-2 nucleic acids MAY BE PRESENT.   A presumptive positive result was obtained on the submitted specimen  and confirmed on repeat testing.  While 2019 novel  coronavirus  (SARS-CoV-2) nucleic acids may be present in the submitted sample  additional confirmatory testing may be necessary for epidemiological  and / or clinical management purposes  to differentiate between  SARS-CoV-2 and other Sarbecovirus currently known to infect humans.  If clinically indicated additional testing with an alternate test  methodology [phone]) is advised. The SARS-CoV-2 RNA is generally  detectable in upper and lower respiratory sp ecimens during the acute  phase of infection. The expected result is Negative. Fact Sheet for Patients:  StrictlyIdeas.no Fact Sheet for Healthcare Providers: BankingDealers.co.za This test is not yet approved or cleared by the [place] FDA and has been authorized for detection and/or diagnosis of SARS-CoV-2 by FDA under an Emergency Use Authorization (EUA).  This EUA will remain in effect (meaning this test can be used) for the duration of the COVID-19 declaration under Section 564(b)(1) of the Act, 21 U.S.C. section 360bbb-3(b)(1), unless the authorization is terminated or revoked sooner. Performed at [facility], [place] [address] [place], [place] [address]   Urine culture     Status: Abnormal   Collection Time: 01/09/19  9:31 PM  Result Value Ref Range Status   Specimen Description URINE, RANDOM  Final   Special Requests NONE  Final   Culture (A)  Final    <10,000 COLONIES/mL INSIGNIFICANT GROWTH Performed at [facility], [place] [address], [place], [place] [address]    Report Status 01/10/2019 FINAL  Final  Surgical pcr screen     Status: None   Collection Time: 01/11/19 10:18 PM  Result Value Ref Range Status   MRSA, PCR NEGATIVE NEGATIVE Final   Staphylococcus aureus NEGATIVE NEGATIVE Final    Comment: (NOTE) The Xpert SA Assay (FDA approved for NASAL specimens in patients 33 years of age and older), is one component of a comprehensive surveillance  program. It is not intended to diagnose infection nor to guide or monitor treatment. Performed  at [facility], [place] [address], [place],  [address]          Radiology Studies: Dg Chest 2 View  Result Date: 01/16/2019 CLINICAL DATA:  Pneumonia EXAM: CHEST - 2 VIEW COMPARISON:  01/09/2019 FINDINGS: The heart size is enlarged. There is some volume overload. There is no pneumothorax. No large pleural effusion. There is no acute osseous abnormality. The lung volumes are low. IMPRESSION: 1. Significant interval increase in size of the cardiac silhouette with developing volume overload. 2. No focal consolidation identified on today's exam. Electronically Signed   By: Constance Holster M.D.   On: 01/16/2019 20:38        Scheduled Meds: . apixaban  5 mg Oral BID  . atorvastatin  10 mg Oral q1800  . folic acid  1 mg Oral Daily  . insulin aspart  0-9 Units Subcutaneous Q4H  . lidocaine  15 mL Mouth/Throat TID AC  . multivitamin with minerals  1 tablet Oral Daily  . pantoprazole  40 mg Intravenous Q12H  . potassium chloride  40 mEq Oral Q4H  . sertraline  100 mg Oral q morning - 10a  . sucralfate  1 g Oral TID WC & HS  . tamsulosin  0.4 mg Oral QPM  . thiamine  100 mg Oral Daily   Or  . thiamine  100 mg Intravenous Daily  . traZODone  200 mg Oral QHS   Continuous Infusions:   LOS: 8 days        Aline August, MD [facility] 01/17/2019, 11:38 AM

## 2019-01-17 NOTE — Evaluation (Signed)
Physical Therapy Evaluation Patient Details Name: Todd [name]. MRN: [id number] DOB: 1956-08-11 Today's Date: 01/17/2019   History of Present Illness  Pt is a 63 y/o male admitted secondary to chest pain and ARF. Pt is s/p endoscopy that revealed severe esophagitis. PMH includes DM, HTN, recurrent DVT and PE, opiod dependence and alcohol abuse.   Clinical Impression  Pt admitted secondary to problem above with deficits below. Pt fatiguing easily which limited ambulation tolerance. Required min guard A for mobility tasks using RW. Educated about need for assist at home and pt initially reporting he has his sons, but then reports he did not have anyone. Had lengthy conversation about SNF vs Home and pt reports he is wanting to go home at d/c and reports his sister is coming to stay for the weekend on Friday. Anticipate pt will progress well. Will continue to follow acutely to maximize functional mobility independence and safety.     Follow Up Recommendations Home health PT;Supervision for mobility/OOB    Equipment Recommendations  Rolling walker with 5" wheels    Recommendations for Other Services OT consult     Precautions / Restrictions Precautions Precautions: Fall Restrictions Weight Bearing Restrictions: No      Mobility  Bed Mobility Overal bed mobility: Modified Independent                Transfers Overall transfer level: Needs assistance Equipment used: Rolling walker (2 wheeled) Transfers: Sit to/from Stand Sit to Stand: Min guard         General transfer comment: Min guard for safety. Pt ignoring cues for safe hand placement. Uses momentum to stand.   Ambulation/Gait Ambulation/Gait assistance: Min guard Gait Distance (Feet): 50 Feet Assistive device: Rolling walker (2 wheeled) Gait Pattern/deviations: Step-through pattern;Decreased stride length Gait velocity: Decreased    General Gait Details: Slow, cautious gait. Mild unsteadiness, however, no  LOB. Pt reporting increased fatigue which limited ambulation distance. Pt rushing to get to chair at end of gait and required cues to take his time. Kept repeating "that took a lot out of me"  Financial trader Rankin (Stroke Patients Only)       Balance Overall balance assessment: Needs assistance Sitting-balance support: No upper extremity supported;Feet supported Sitting balance-Leahy Scale: Good     Standing balance support: Bilateral upper extremity supported;During functional activity Standing balance-Leahy Scale: Poor Standing balance comment: Reliant on BUE support                              Pertinent Vitals/Pain Pain Assessment: Faces Faces Pain Scale: Hurts little more Pain Location: throat and stomach Pain Descriptors / Indicators: Constant Pain Intervention(s): Monitored during session;Limited activity within patient's tolerance;Repositioned    Home Living Family/patient expects to be discharged to:: Private residence Living Arrangements: Alone Available Help at Discharge: Family;Available PRN/intermittently Type of Home: Other(Comment)(Condo) Home Access: Level entry     Home Layout: Two level Home Equipment: None Additional Comments: Reports sister will be coming in on Friday.     Prior Function Level of Independence: Independent               Hand Dominance        Extremity/Trunk Assessment   Upper Extremity Assessment Upper Extremity Assessment: Defer to OT evaluation    Lower Extremity Assessment Lower Extremity Assessment: Generalized weakness    Cervical /  Trunk Assessment Cervical / Trunk Assessment: Normal  Communication   Communication: No difficulties  Cognition Arousal/Alertness: Awake/alert Behavior During Therapy: WFL for tasks assessed/performed Overall Cognitive Status: Within Functional Limits for tasks assessed                                         General Comments General comments (skin integrity, edema, etc.): Had lengthy conversation about SNF vs Home and pt wanting to go home at this time.     Exercises     Assessment/Plan    PT Assessment Patient needs continued PT services  PT Problem List Decreased activity tolerance;Decreased balance;Decreased mobility;Decreased strength;Decreased knowledge of use of DME;Decreased knowledge of precautions       PT Treatment Interventions DME instruction;Gait training;Stair training;Functional mobility training;Therapeutic activities;Therapeutic exercise;Balance training;Patient/family education    PT Goals (Current goals can be found in the Care Plan section)  Acute Rehab PT Goals Patient Stated Goal: to get my strength back PT Goal Formulation: With patient Time For Goal Achievement: 01/31/19 Potential to Achieve Goals: Good    Frequency Min 3X/week   Barriers to discharge Decreased caregiver support      Co-evaluation               AM-PAC PT "6 Clicks" Mobility  Outcome Measure Help needed turning from your back to your side while in a flat bed without using bedrails?: None Help needed moving from lying on your back to sitting on the side of a flat bed without using bedrails?: None Help needed moving to and from a bed to a chair (including a wheelchair)?: A Little Help needed standing up from a chair using your arms (e.g., wheelchair or bedside chair)?: A Little Help needed to walk in hospital room?: A Little Help needed climbing 3-5 steps with a railing? : A Lot 6 Click Score: 19    End of Session Equipment Utilized During Treatment: Gait belt Activity Tolerance: Patient limited by fatigue Patient left: in chair;with call bell/phone within reach Nurse Communication: Mobility status PT Visit Diagnosis: Muscle weakness (generalized) (M62.81);Other abnormalities of gait and mobility (R26.89)    Time: 1950-9326 PT Time Calculation (min) (ACUTE ONLY): 25  min   Charges:   PT Evaluation $PT Eval Low Complexity: 1 Low PT Treatments $Gait Training: 8-22 mins        Leighton Ruff, PT, DPT  Acute Rehabilitation Services  Pager: [phone] Office: [phone]   Rudean Hitt 01/17/2019, 2:41 PM

## 2019-01-18 LAB — GLUCOSE, CAPILLARY
Glucose-Capillary: 118 mg/dL — ABNORMAL HIGH (ref 70–99)
Glucose-Capillary: 118 mg/dL — ABNORMAL HIGH (ref 70–99)
Glucose-Capillary: 120 mg/dL — ABNORMAL HIGH (ref 70–99)
Glucose-Capillary: 123 mg/dL — ABNORMAL HIGH (ref 70–99)
Glucose-Capillary: 123 mg/dL — ABNORMAL HIGH (ref 70–99)

## 2019-01-18 LAB — COMPREHENSIVE METABOLIC PANEL
ALT: 29 U/L (ref 0–44)
AST: 36 U/L (ref 15–41)
Albumin: 3.1 g/dL — ABNORMAL LOW (ref 3.5–5.0)
Alkaline Phosphatase: 54 U/L (ref 38–126)
Anion gap: 11 (ref 5–15)
BUN: 12 mg/dL (ref 8–23)
CO2: 26 mmol/L (ref 22–32)
Calcium: 8.9 mg/dL (ref 8.9–10.3)
Chloride: 105 mmol/L (ref 98–111)
Creatinine, Ser: 1.46 mg/dL — ABNORMAL HIGH (ref 0.61–1.24)
GFR calc Af Amer: 59 mL/min — ABNORMAL LOW (ref >60)
GFR calc non Af Amer: 51 mL/min — ABNORMAL LOW (ref >60)
Glucose, Bld: 117 mg/dL — ABNORMAL HIGH (ref 70–99)
Potassium: 3.2 mmol/L — ABNORMAL LOW (ref 3.5–5.1)
Sodium: 142 mmol/L (ref 135–145)
Total Bilirubin: 0.6 mg/dL (ref 0.3–1.2)
Total Protein: 6.5 g/dL (ref 6.5–8.1)

## 2019-01-18 LAB — CBC
HCT: 37.4 % — ABNORMAL LOW (ref 39.0–52.0)
Hemoglobin: 12.3 g/dL — ABNORMAL LOW (ref 13.0–17.0)
MCH: 31.9 pg (ref 26.0–34.0)
MCHC: 32.9 g/dL (ref 30.0–36.0)
MCV: 97.1 fL (ref 80.0–100.0)
Platelets: 214 10*3/uL (ref 150–400)
RBC: 3.85 MIL/uL — ABNORMAL LOW (ref 4.22–5.81)
RDW: 13.3 % (ref 11.5–15.5)
WBC: 5.7 10*3/uL (ref 4.0–10.5)
nRBC: 0 % (ref 0.0–0.2)

## 2019-01-18 LAB — MAGNESIUM: Magnesium: 1.4 mg/dL — ABNORMAL LOW (ref 1.7–2.4)

## 2019-01-18 MED ORDER — AMLODIPINE BESYLATE 5 MG PO TABS
5.0000 mg | ORAL_TABLET | Freq: Every day | ORAL | Status: DC
  Administered 2019-01-18: 5 mg via ORAL
  Filled 2019-01-18: qty 1

## 2019-01-18 MED ORDER — ENSURE ENLIVE PO LIQD: 237.0000 mL | Freq: Three times a day (TID) | ORAL | Status: DC

## 2019-01-18 MED ORDER — AMLODIPINE BESYLATE 10 MG PO TABS: 10.0000 mg | ORAL_TABLET | Freq: Every day | ORAL | Status: DC

## 2019-01-18 MED ORDER — AMLODIPINE BESYLATE 5 MG PO TABS: 5.0000 mg | ORAL_TABLET | Freq: Once | ORAL | Status: DC

## 2019-01-18 NOTE — Progress Notes (Signed)
Initial Nutrition Assessment  RD working remotely.  DOCUMENTATION CODES:   Obesity unspecified  INTERVENTION:   -Continue MVI with minerals daily -Ensure Enlive po TID, each supplement provides 350 kcal and 20 grams of protein  NUTRITION DIAGNOSIS:   Inadequate oral intake related to decreased appetite as evidenced by meal completion < 50%.  GOAL:   Patient will meet greater than or equal to 90% of their needs  MONITOR:   PO intake, Supplement acceptance, Labs, Weight trends, Skin, I & O's  REASON FOR ASSESSMENT:   LOS    ASSESSMENT:   Todd [name]. is a 63 y.o. 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  Pt admitted with acute kidney failure.  5/26- CT abd/pelvis showing patchy bibasilar infiltrate likely atypical PNA. 5/29- s/p endo- reveals severe distal esophagitis 6/1- biopsies form endoscopy revealed some atypia and metaplasia but no infection  Reviewed I/O's: -920 ml x 24 hours and +1.4 L since admission  UOP: 2 L x 24 hours  Attempted to speak with pt via phone, however, no answer. Unable to obtain further nutrition-related history at this time.  Per chart review, intake remains poor. Pt complains of painful swallowing, but has been consuming some solid food. It appears pt tolerates liquids better than solid food at this time. Meal completion records document PO 30-75% of meals.   Reviewed wt hx; pt has experienced a 1.8% wt loss over the past 6 months, which is not significant for time frame.   Given decreased oral intake, pt would benefit from addition of nutritional supplements to aid in adequate oral intake. Pt with poor oral intake and would benefit from nutrient dense supplement. One Ensure Enlive supplement provides 350 kcals, 20 grams protein, and 44-45 grams of carbohydrate vs one Glucerna shake supplement, which provides 220 kcals, 10 grams of protein, and 26 grams of  carbohydrate. Given pt's hx of DM, RD will continue to monitor PO intake, CBGS, and adjust supplement regimen as appropriate.   Lab Results  Component Value Date   HGBA1C 6.4 (H) 01/10/2019   PTA DM medications are 1000 mg metformin BID.   Labs reviewed: CBGS: 120-123 (inpatient orders for glycemic control are 0-9 units insulin aspart every 4 hours).   NUTRITION - FOCUSED PHYSICAL EXAM:    Most Recent Value  Orbital Region  Unable to assess  Upper Arm Region  Unable to assess  Thoracic and Lumbar Region  Unable to assess  Buccal Region  Unable to assess  Temple Region  Unable to assess  Clavicle Bone Region  Unable to assess  Clavicle and Acromion Bone Region  Unable to assess  Scapular Bone Region  Unable to assess  Dorsal Hand  Unable to assess  Patellar Region  Unable to assess  Anterior Thigh Region  Unable to assess  Posterior Calf Region  Unable to assess  Edema (RD Assessment)  Unable to assess  Hair  Unable to assess  Eyes  Unable to assess  Mouth  Unable to assess  Skin  Unable to assess  Nails  Unable to assess       Diet Order:   Diet Order            DIET SOFT Room service appropriate? Yes with Assist; Fluid consistency: Thin  Diet effective now              EDUCATION NEEDS:   No education needs have been identified at this  time  Skin:  Skin Assessment: Reviewed RN Assessment  Last BM:  01/15/19  Height:   Ht Readings from Last 1 Encounters:  01/12/19 5\' 9"  (1.753 m)    Weight:   Wt Readings from Last 1 Encounters:  01/12/19 103.3 kg    Ideal Body Weight:  72.7 kg  BMI:  Body mass index is 33.63 kg/m.  Estimated Nutritional Needs:   Kcal:  1900-2100  Protein:  95-110 grams  Fluid:  1.9-2.1 L    Kha Hari A. Jimmye Norman, RD, LDN, [place] Registered Dietitian II Certified Diabetes Care and Education Specialist Pager: [phone] After hours Pager: [phone]

## 2019-01-18 NOTE — Progress Notes (Signed)
Patient ID: Todd Marti., male   DOB: 11-13-1955, 63 y.o.   MRN: [id number]  PROGRESS NOTE    Todd [name].  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  He was found to have acute kidney injury with creatinine of 4.11.  COVID-19 testing was negative.  He was treated with IV fluids.  GI was consulted and he underwent upper GI endoscopy which showed severe esophagitis and gastritis.  He was also treated with antibiotics for pneumonia.  Assessment & Plan:   Principal Problem:   Acute kidney failure (HCC) Active Problems:   DM (diabetes mellitus), type 2 ([place])   Essential hypertension   CHEST PAIN   Alcohol use disorder, severe, dependence ([place])   Opioid use disorder, moderate, dependence (HCC)  Acute renal failure -Probably prerenal from dehydration -Presented with creatinine of 4.11 -Creatinine 1.46 today. -Treated with IV fluids and subsequently discontinued. - Encourage the patient to improve oral intake -Oral intake is still poor.  Severe erosive esophagitis with gastritis Atypical chest pain probably from above Painful swallowing -Status post EGD on 01/12/2019 which showed severe erosive esophagitis with gastritis.  GI has signed off.  Outpatient follow-up with GI.  Esophageal biopsy apparently showed no herpetic infection but just inflammation/ulceration/reactive atypia as per GI -Continue Protonix with sucralfate along with Viscous Lidocaine -Patient still does not feel well and appetite is not that great.  We will continue to encourage oral intake.  Hopefully he feels better by tomorrow and we can discharge him tomorrow.  He still feels nauseous with intermittent abdominal pain and poor appetite.  Alcohol  abuse with severe dependence -Continue thiamine and folic acid.  Continue CIWA protocol.  Cessation counseling was provided during the hospitalization  Hypertension -Blood pressure is elevated.  Will start amlodipine 5 mg daily.  Hypokalemia -Replace.  Repeat a.m. labs  Hypomagnesemia -Replace.  Repeat a.m. labs  Probable bacterial pneumonia -Completed a course of antibiotics.  Antibiotics discontinued on 01/17/2019.  Currently on room air.  Opiate use disorder, with dependence -Outpatient follow-up with pain management.  DC IV morphine.  Use short-acting oral pain meds.  Outpatient follow-up.  BPH--continue Flomax  Depression/anxiety -No suicidal ideation or hallucination -Continue trazodone and Zoloft  History of recurrent DVTs and PE -Heparin drip has been switched to Eliquis.  Hemoglobin stable  Generalized deconditioning -PT eval recommend home health PT.  DVT prophylaxis: Eliquis Code Status: Full Family Communication: None at bedside Disposition Plan: Home tomorrow if clinically improves  Consultants: GI Procedures:  EGD Impression:               - Normal larynx.                           - Severe erosive esophagitis, nonspecific in                            appearance so etiology is unclear, but not classic                            for infectious esophagitis. This readily explains  the patient's severe chest pain and esophageal                            symptoms. Biopsied.                           - Small hiatal hernia.                           - Erythematous mucosa in the antrum.                           - A single papule (nodule) found in the stomach,                            probably gastritis-associated mucosal edema.                            Biopsied.                           - Normal examined duodenum. Recommendation:           - Continue present medications. Supportive care                            including pain  medication. Liquid diet. Suspect                            esophagitis will resolve spontaneously over the                            next week. Based on endoscopic appearance, no need                            for initiation of empiric anti-infective therapy                            for viral or fungal esophagitis while awaiting                            biopsy results.                           - Await pathology results and treat accordingly if                            a specific cause for the esophagitis is found. Antimicrobials:  Anti-infectives (From admission, onward)   Start     Dose/Rate Route Frequency Ordered Stop   01/16/19 2200  amoxicillin-clavulanate (AUGMENTIN) 875-125 MG per tablet 1 tablet  Status:  Discontinued     1 tablet Oral Every 12 hours 01/16/19 1731 01/17/19 1055   01/10/19 1800  azithromycin (ZITHROMAX) tablet 250 mg     250 mg Oral Daily 01/09/19 2325 01/13/19 1736   01/10/19 0800  cefTRIAXone (ROCEPHIN) 1 g in sodium chloride 0.9 % 100 mL IVPB  Status:  Discontinued  1 g 200 mL/hr over 30 Minutes Intravenous Daily 01/10/19 0722 01/16/19 1731   01/10/19 0000  azithromycin (ZITHROMAX) tablet 500 mg     500 mg Oral Daily 01/09/19 2325 01/10/19 0100      Subjective: Patient seen and examined at bedside.  He still does not feel well and states that he is still intermittently nauseous with very poor appetite.  Still complaining of intermittent abdominal pain. Objective: Vitals:   01/17/19 1246 01/17/19 1518 01/17/19 2112 01/18/19 0452  BP: (!) 172/101 (!) 167/93 (!) 177/99 (!) 154/83  Pulse: 60  63 (!) 59  Resp: 17  16 16   Temp:   98.5 F (36.9 C) 98.1 F (36.7 C)  TempSrc:      SpO2: 95%  94% 96%  Weight:      Height:        Intake/Output Summary (Last 24 hours) at 01/18/2019 1003 Last data filed at 01/18/2019 0455 Gross per 24 hour  Intake 840 ml  Output 1600 ml  Net -760 ml   Filed Weights   01/09/19 1756 01/10/19 0000 01/12/19 0725   Weight: 102.1 kg 103.3 kg 103.3 kg    Examination:  General exam: No distress.  Looks older than stated age.   Respiratory system: Bilateral decreased breath sounds at bases with some scattered crackles.  No wheezing Cardiovascular system: Rate controlled, S1-S2 heard Gastrointestinal system: Abdomen is nondistended, soft and mildly tender in the epigastric and periumbilical regions. Normal bowel sounds heard. Extremities: No cyanosis,  edema    Data Reviewed: I have personally reviewed following labs and imaging studies  CBC: Recent Labs  Lab 01/14/19 0312 01/15/19 0528 01/16/19 0249 01/17/19 0312 01/18/19 0729  WBC 6.6 6.5 6.0 5.1 5.7  HGB 12.0* 11.7* 11.7* 11.8* 12.3*  HCT 36.2* 35.1* 35.6* 35.6* 37.4*  MCV 96.8 97.2 96.7 97.0 97.1  PLT 199 191 210 198 937   Basic Metabolic Panel: Recent Labs  Lab 01/13/19 0215 01/15/19 0528 01/17/19 0312 01/18/19 0729  NA 141 144 144 142  K 3.4* 3.2* 3.1* 3.2*  CL 110 110 105 105  CO2 20* 23 28 26   GLUCOSE 104* 102* 105* 117*  BUN 21 12 15 12   CREATININE 2.04* 1.78* 1.62* 1.46*  CALCIUM 8.2* 8.1* 8.4* 8.9  MG  --   --   --  1.4*   GFR: Estimated Creatinine Clearance: 62.1 mL/min (A) (by C-G formula based on SCr of 1.46 mg/dL (H)). Liver Function Tests: Recent Labs  Lab 01/18/19 0729  AST 36  ALT 29  ALKPHOS 54  BILITOT 0.6  PROT 6.5  ALBUMIN 3.1*   No results for input(s): LIPASE, AMYLASE in the last 168 hours. No results for input(s): AMMONIA in the last 168 hours. Coagulation Profile: No results for input(s): INR, PROTIME in the last 168 hours. Cardiac Enzymes: No results for input(s): CKTOTAL, CKMB, CKMBINDEX, TROPONINI in the last 168 hours. BNP (last 3 results) No results for input(s): PROBNP in the last 8760 hours. HbA1C: No results for input(s): HGBA1C in the last 72 hours. CBG: Recent Labs  Lab 01/17/19 1657 01/17/19 2002 01/18/19 0050 01/18/19 0447 01/18/19 0811  GLUCAP 191* 118* 118* 123* 123*    Lipid Profile: No results for input(s): CHOL, HDL, LDLCALC, TRIG, CHOLHDL, LDLDIRECT in the last 72 hours. Thyroid Function Tests: No results for input(s): TSH, T4TOTAL, FREET4, T3FREE, THYROIDAB in the last 72 hours. Anemia Panel: No results for input(s): VITAMINB12, FOLATE, FERRITIN, TIBC, IRON, RETICCTPCT in the last 72 hours.  Sepsis Labs: No results for input(s): PROCALCITON, LATICACIDVEN in the last 168 hours.  Recent Results (from the past 240 hour(s))  SARS Coronavirus 2 (CEPHEID- Performed in [facility] hospital lab), Hosp Order     Status: None   Collection Time: 01/09/19  6:26 PM  Result Value Ref Range Status   SARS Coronavirus 2 NEGATIVE NEGATIVE Final    Comment: (NOTE) If result is NEGATIVE SARS-CoV-2 target nucleic acids are NOT DETECTED. The SARS-CoV-2 RNA is generally detectable in upper and lower  respiratory specimens during the acute phase of infection. The lowest  concentration of SARS-CoV-2 viral copies this assay can detect is 250  copies / mL. A negative result does not preclude SARS-CoV-2 infection  and should not be used as the sole basis for treatment or other  patient management decisions.  A negative result may occur with  improper specimen collection / handling, submission of specimen other  than nasopharyngeal swab, presence of viral mutation(s) within the  areas targeted by this assay, and inadequate number of viral copies  (<250 copies / mL). A negative result must be combined with clinical  observations, patient history, and epidemiological information. If result is POSITIVE SARS-CoV-2 target nucleic acids are DETECTED. The SARS-CoV-2 RNA is generally detectable in upper and lower  respiratory specimens dur ing the acute phase of infection.  Positive  results are indicative of active infection with SARS-CoV-2.  Clinical  correlation with patient history and other diagnostic information is  necessary to determine patient infection status.   Positive results do  not rule out bacterial infection or co-infection with other viruses. If result is PRESUMPTIVE POSTIVE SARS-CoV-2 nucleic acids MAY BE PRESENT.   A presumptive positive result was obtained on the submitted specimen  and confirmed on repeat testing.  While 2019 novel coronavirus  (SARS-CoV-2) nucleic acids may be present in the submitted sample  additional confirmatory testing may be necessary for epidemiological  and / or clinical management purposes  to differentiate between  SARS-CoV-2 and other Sarbecovirus currently known to infect humans.  If clinically indicated additional testing with an alternate test  methodology [phone]) is advised. The SARS-CoV-2 RNA is generally  detectable in upper and lower respiratory sp ecimens during the acute  phase of infection. The expected result is Negative. Fact Sheet for Patients:  StrictlyIdeas.no Fact Sheet for Healthcare Providers: BankingDealers.co.za This test is not yet approved or cleared by the [place] FDA and has been authorized for detection and/or diagnosis of SARS-CoV-2 by FDA under an Emergency Use Authorization (EUA).  This EUA will remain in effect (meaning this test can be used) for the duration of the COVID-19 declaration under Section 564(b)(1) of the Act, 21 U.S.C. section 360bbb-3(b)(1), unless the authorization is terminated or revoked sooner. Performed at [facility], [place] [address] [place], [place] [address]   Urine culture     Status: Abnormal   Collection Time: 01/09/19  9:31 PM  Result Value Ref Range Status   Specimen Description URINE, RANDOM  Final   Special Requests NONE  Final   Culture (A)  Final    <10,000 COLONIES/mL INSIGNIFICANT GROWTH Performed at [facility], [place] [address], [place], [place] [address]    Report Status 01/10/2019 FINAL  Final  Surgical pcr screen     Status: None   Collection Time: 01/11/19 10:18  PM  Result Value Ref Range Status   MRSA, PCR NEGATIVE NEGATIVE Final   Staphylococcus aureus NEGATIVE NEGATIVE Final  Comment: (NOTE) The Xpert SA Assay (FDA approved for NASAL specimens in patients 98 years of age and older), is one component of a comprehensive surveillance program. It is not intended to diagnose infection nor to guide or monitor treatment. Performed at [facility], [place] [address], [place], [place] [address]          Radiology Studies: Dg Chest 2 View  Result Date: 01/16/2019 CLINICAL DATA:  Pneumonia EXAM: CHEST - 2 VIEW COMPARISON:  01/09/2019 FINDINGS: The heart size is enlarged. There is some volume overload. There is no pneumothorax. No large pleural effusion. There is no acute osseous abnormality. The lung volumes are low. IMPRESSION: 1. Significant interval increase in size of the cardiac silhouette with developing volume overload. 2. No focal consolidation identified on today's exam. Electronically Signed   By: Constance Holster M.D.   On: 01/16/2019 20:38        Scheduled Meds: . amLODipine  5 mg Oral Daily  . apixaban  5 mg Oral BID  . atorvastatin  10 mg Oral q1800  . folic acid  1 mg Oral Daily  . insulin aspart  0-9 Units Subcutaneous Q4H  . lidocaine  15 mL Mouth/Throat TID AC  . multivitamin with minerals  1 tablet Oral Daily  . pantoprazole  40 mg Intravenous Q12H  . sertraline  100 mg Oral q morning - 10a  . sucralfate  1 g Oral TID WC & HS  . tamsulosin  0.4 mg Oral QPM  . thiamine  100 mg Oral Daily   Or  . thiamine  100 mg Intravenous Daily  . traZODone  200 mg Oral QHS   Continuous Infusions:   LOS: 9 days        Aline August, MD [facility] 01/18/2019, 10:03 AM

## 2019-01-18 NOTE — Progress Notes (Signed)
Pt signed out AMA. Pt taken downstairs via wheelchair accompanied by a staff member. Pt took all of his belongings including his new walker home with him. IV site was removed.

## 2019-01-18 NOTE — Progress Notes (Signed)
Pt with an elevated bp of 171/100 demanding to be discharged. Paged provider Starla Link who stated we can't make patient say but he'd have to sign out against medical advice. Informed patient of the importance of him staying to complete his treatment and that physical therapy recommended he use a rolling walker with physical therapy assitsance at home. Pt just given po narcotic. Will try to convince pt to stay at least one hour post medication administration.

## 2019-01-18 NOTE — Progress Notes (Signed)
Physical Therapy Treatment Patient Details Name: Todd [name]. MRN: [id number] DOB: Aug 19, 1955 Today's Date: 01/18/2019    History of Present Illness Pt is a 63 y/o male admitted secondary to chest pain and ARF. Pt is s/p endoscopy that revealed severe esophagitis. PMH includes DM, HTN, recurrent DVT and PE, opiod dependence and alcohol abuse.     PT Comments    Assisted OOB to amb to bathroom using RW.  General Gait Details: tolerated an increased distance but with Mod c/o B LE weakness and general c/o fatigue.  "wow I'm weak"  Follow Up Recommendations  Home health PT;Supervision for mobility/OOB     Equipment Recommendations  Rolling walker with 5" wheels    Recommendations for Other Services       Precautions / Restrictions Precautions Precautions: Fall Restrictions Weight Bearing Restrictions: No    Mobility  Bed Mobility Overal bed mobility: Modified Independent             General bed mobility comments: pt self able with increased time  Transfers Overall transfer level: Needs assistance Equipment used: Rolling walker (2 wheeled) Transfers: Sit to/from Stand Sit to Stand: Min guard;Supervision         General transfer comment: assisted off bed and toilet transfer.  Pt uses forward momentum.    Ambulation/Gait Ambulation/Gait assistance: Supervision;Min guard Gait Distance (Feet): 75 Feet Assistive device: Rolling walker (2 wheeled) Gait Pattern/deviations: Step-through pattern;Decreased stride length Gait velocity: decreased    General Gait Details: tolerated an increased distance but with Mod c/o B LE weakness and general c/o fatigue.  "wow I'm weak"   Stairs             Wheelchair Mobility    Modified Rankin (Stroke Patients Only)       Balance                                            Cognition Arousal/Alertness: Awake/alert Behavior During Therapy: WFL for tasks assessed/performed Overall Cognitive  Status: Within Functional Limits for tasks assessed                                        Exercises      General Comments        Pertinent Vitals/Pain Pain Assessment: Faces Faces Pain Scale: Hurts a little bit Pain Location: stomach (gas pains) Pain Descriptors / Indicators: Discomfort;Cramping    Home Living                      Prior Function            PT Goals (current goals can now be found in the care plan section) Progress towards PT goals: Progressing toward goals    Frequency    Min 3X/week      PT Plan Current plan remains appropriate    Co-evaluation              AM-PAC PT "6 Clicks" Mobility   Outcome Measure  Help needed turning from your back to your side while in a flat bed without using bedrails?: None Help needed moving from lying on your back to sitting on the side of a flat bed without using bedrails?: None Help needed moving to and from a bed to a  chair (including a wheelchair)?: A Little Help needed standing up from a chair using your arms (e.g., wheelchair or bedside chair)?: A Little Help needed to walk in hospital room?: A Little Help needed climbing 3-5 steps with a railing? : A Lot 6 Click Score: 19    End of Session Equipment Utilized During Treatment: Gait belt Activity Tolerance: Patient limited by fatigue Patient left: in bed;with call bell/phone within reach   PT Visit Diagnosis: Muscle weakness (generalized) (M62.81);Other abnormalities of gait and mobility (R26.89)     Time: 9290-9030 PT Time Calculation (min) (ACUTE ONLY): 15 min  Charges:  $Gait Training: 8-22 mins                     Rica Koyanagi  PTA Acute  Rehabilitation Services Pager      [phone] Office      [phone]

## 2019-01-18 NOTE — Plan of Care (Signed)
  Problem: Education: Goal: Knowledge of General Education information will improve Description Including pain rating scale, medication(s)/side effects and non-pharmacologic comfort measures Outcome: Progressing   Problem: Health Behavior/Discharge Planning: Goal: Ability to manage health-related needs will improve Outcome: Progressing   

## 2019-01-18 NOTE — Progress Notes (Signed)
OT Cancellation Note  Patient Details Name: Todd [name]. MRN: [id number] DOB: [birth year]   Cancelled Treatment:    Reason Eval/Treat Not Completed: Patient declined, no reason specified(pt asking for OT to come back after 11am 2nd to lack of sleep) OT to follow up next available treatment time.  Ebony Hail Harold Hedge) Marsa Aris OTR/L Acute Rehabilitation Services Pager: [phone] Office: [place] 01/18/2019, 9:12 AM

## 2019-01-18 NOTE — Progress Notes (Signed)
Pt requesting nurses not give report at bedside but outside of room. Informed pt of importance of bedside report and policy. Pt still requesting staff to give report outside of his room.

## 2019-01-18 NOTE — Evaluation (Signed)
Occupational Therapy Evaluation Patient Details Name: Todd [name]. MRN: [id number] DOB: 08/30/1955 Today's Date: 01/18/2019    History of Present Illness Pt is a 63 y/o male admitted secondary to chest pain and ARF. Pt is s/p endoscopy that revealed severe esophagitis. PMH includes DM, HTN, recurrent DVT and PE, opiod dependence and alcohol abuse.    Clinical Impression   Pt PTA: living alone and independent. Pt performing ADl functional mobility and transfers with supervisionA and use fo RW. Pt reports gas and discomfort in belly region.Pt performing LB dress with figure 4 technique, increased time and SOB noted (O2 >95% on RA and HR 78 BPM with activity), but no physical assist required for toilet transfer, LB dressing or grooming at sink. Pt tolerating session well. Pt education on energy conservation initiated. OT to follow acutely for ADL and energy conservation needs.    Follow Up Recommendations  Home health OT;Supervision/Assistance - 24 hour    Equipment Recommendations  3 in 1 bedside commode;Tub/shower seat(pt prefers a shower chair, but offered a 3in1)    Recommendations for Other Services       Precautions / Restrictions Precautions Precautions: Fall Restrictions Weight Bearing Restrictions: No      Mobility Bed Mobility Overal bed mobility: Modified Independent             General bed mobility comments: pt self able with increased time  Transfers Overall transfer level: Needs assistance Equipment used: Rolling walker (2 wheeled) Transfers: Sit to/from Stand Sit to Stand: Supervision         General transfer comment: Pt supervision for taxing effort.    Balance Overall balance assessment: Needs assistance Sitting-balance support: No upper extremity supported;Feet supported Sitting balance-Leahy Scale: Good       Standing balance-Leahy Scale: Fair Standing balance comment: Reliant on BUE support                            ADL  either performed or assessed with clinical judgement   ADL Overall ADL's : At baseline                                       General ADL Comments: Pt performing LB dress with figure 4 technique, increased time and SOB noted (O2 >95% on RA and HR 78 BPM with activity), but no physical assist required for toilet transfer, LB dressing or grooming at sink.     Vision Baseline Vision/History: No visual deficits Vision Assessment?: No apparent visual deficits     Perception     Praxis      Pertinent Vitals/Pain Pain Assessment: Faces Faces Pain Scale: Hurts little more Pain Location: stomach (gas pains) Pain Descriptors / Indicators: Discomfort;Cramping Pain Intervention(s): Limited activity within patient's tolerance     Hand Dominance     Extremity/Trunk Assessment Upper Extremity Assessment Upper Extremity Assessment: Overall WFL for tasks assessed   Lower Extremity Assessment Lower Extremity Assessment: Defer to PT evaluation;Generalized weakness   Cervical / Trunk Assessment Cervical / Trunk Assessment: Normal   Communication Communication Communication: No difficulties   Cognition Arousal/Alertness: Awake/alert Behavior During Therapy: WFL for tasks assessed/performed Overall Cognitive Status: Within Functional Limits for tasks assessed  General Comments  Education for energy conservation performed    Exercises     Shoulder Instructions      Home Living Family/patient expects to be discharged to:: Private residence Living Arrangements: Alone Available Help at Discharge: Family;Available PRN/intermittently Type of Home: Other(Comment)(condo) Home Access: Level entry     Home Layout: Two level Alternate Level Stairs-Number of Steps: flight   Bathroom Shower/Tub: Teacher, early years/pre: Standard     Home Equipment: None   Additional Comments: Reports sister will be coming  in on Friday.       Prior Functioning/Environment Level of Independence: Independent                 OT Problem List: Decreased strength;Decreased activity tolerance;Impaired balance (sitting and/or standing);Decreased safety awareness;Pain      OT Treatment/Interventions: Self-care/ADL training;Therapeutic exercise;Neuromuscular education;Energy conservation;Patient/family education;Balance training    OT Goals(Current goals can be found in the care plan section) Acute Rehab OT Goals Patient Stated Goal: to get my strength back OT Goal Formulation: With patient Time For Goal Achievement: 02/01/19 Potential to Achieve Goals: Good ADL Goals Additional ADL Goal #1: Pt will state 3 energy conservation strategies to carry over for home environment Additional ADL Goal #2: Pt will perform ADL functional transfers and ADL functional mobility with modified independence  OT Frequency: Min 2X/week   Barriers to D/C:            Co-evaluation              AM-PAC OT "6 Clicks" Daily Activity     Outcome Measure Help from another person eating meals?: None Help from another person taking care of personal grooming?: None Help from another person toileting, which includes using toliet, bedpan, or urinal?: A Little Help from another person bathing (including washing, rinsing, drying)?: A Little Help from another person to put on and taking off regular upper body clothing?: None Help from another person to put on and taking off regular lower body clothing?: A Little 6 Click Score: 21   End of Session Equipment Utilized During Treatment: Gait belt;Rolling walker Nurse Communication: Mobility status  Activity Tolerance: Patient tolerated treatment well Patient left: in bed;with call bell/phone within reach  OT Visit Diagnosis: Unsteadiness on feet (R26.81);Muscle weakness (generalized) (M62.81)                Time: 5456-2563 OT Time Calculation (min): 15 min Charges:  OT  General Charges $OT Visit: 1 Visit OT Evaluation $OT Eval Moderate Complexity: 1 Mod  Darryl Nestle) Marsa Aris OTR/L Acute Rehabilitation Services Pager: [phone] Office: [place] 01/18/2019, 4:14 PM

## 2019-01-18 NOTE — Care Management Important Message (Signed)
Important Message  Patient Details  Name: Todd [name]. MRN: [id number] Date of Birth: 04/05/1956   Medicare Important Message Given:  Yes    [name] 01/18/2019, 2:36 PM

## 2019-01-19 NOTE — Discharge Summary (Signed)
Physician Discharge Summary  [name] 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  He was found to have acute kidney injury with creatinine of 4.11.  COVID-19 testing was negative.  He was treated with IV fluids.  GI was consulted and he underwent upper GI endoscopy which showed severe esophagitis and gastritis.  He was also treated with antibiotics for pneumonia.  His condition was improving but was still having nausea with very poor appetite.  He signed out of the medical advice on 01/18/2019.    Discharge Diagnoses:  Principal Problem:   Acute kidney failure ([place]) Active Problems:   DM (diabetes mellitus), type 2 ([place])   Essential hypertension   CHEST PAIN   Alcohol use disorder, severe, dependence ([facility])   Opioid use disorder, moderate, dependence ([place])  Acute renal failure -Probably prerenal from dehydration -Presented with creatinine of 4.11 -Treated with IV fluids and subsequently discontinued.  Creatinine had much improved. -Oral intake was still poor.  Severe erosive esophagitis with gastritis Atypical chest pain probably from above Painful swallowing -Status post EGD on 01/12/2019 which showed severe erosive esophagitis with gastritis.  GI has signed off.  Outpatient follow-up with GI.  Esophageal biopsy apparently showed no herpetic infection but just inflammation/ulceration/reactive atypia as per GI -Patient was on Protonix, sucralfate and viscous lidocaine.  Oral intake was still poor with intermittent nausea and intermittent  abdominal pain.  He signed out [place] on 01/18/2019 despite understanding the risks of doing so including worsening of condition and death.  Alcohol abuse with severe dependence -Treated with CIWA protocol along with thiamine and folic acid.  Hypertension -Blood pressure was elevated prior to discharge.  Hypokalemia -Was replaced during hospitalization  Hypomagnesemia -Was replaced during hospitalization  Probable bacterial pneumonia -Completed a course of antibiotics.  Antibiotics discontinued on 01/17/2019.    Was on room air prior to signing out [facility]  Opiate use disorder, with dependence -Outpatient follow-up with pain management.    BPH--Flomax was continued.  Depression/anxiety -No suicidal ideation or hallucination -Trazodone and Zoloft were continued.  History of recurrent DVTs and PE -Heparin drip was switched to Eliquis.  Generalized deconditioning -PT eval recommended home health PT. but he signed against medical advice  Consultations:  None   Procedures/Studies: Ct Abdomen Pelvis Wo Contrast  Result Date: 01/09/2019 CLINICAL DATA:  Upper abdominal pain with shortness of breath for several days EXAM: CT ABDOMEN AND PELVIS WITHOUT CONTRAST TECHNIQUE: Multidetector CT imaging of the abdomen and pelvis was performed following the standard protocol without IV contrast. COMPARISON:  10/03/2014 FINDINGS: Lower chest: Mild patchy infiltrate is noted in the bases bilaterally. No focal confluent infiltrate or sizable effusion is noted. Hepatobiliary: No focal liver abnormality is seen. Status post cholecystectomy. No biliary dilatation. Pancreas: Unremarkable. No pancreatic ductal dilatation or surrounding inflammatory changes. Spleen: Normal in size without focal abnormality. Adrenals/Urinary Tract: Adrenal glands are within normal limits. Kidneys demonstrate no renal calculi or urinary tract obstructive changes. Vague hypodensities are  noted within the left kidney consistent with cysts stable from the prior exam. No obstructive changes are seen. The bladder is decompressed. Stomach/Bowel: Scattered diverticular change  of the colon is noted without evidence of diverticulitis. The appendix is within normal limits. No small bowel abnormality is seen. The stomach is unremarkable with the exception of a small sliding-type hiatal hernia. Vascular/Lymphatic: Aortic atherosclerosis. No enlarged abdominal or pelvic lymph nodes. Reproductive: Changes consistent with prior prostatectomy. Other: No abdominal wall hernia or abnormality. No abdominopelvic ascites. Musculoskeletal: Mild degenerative changes of lumbar spine are noted. Old rib fractures are noted with healing on the left. IMPRESSION: Patchy bibasilar infiltrate without sizable effusion. This likely represents atypical pneumonia. Diverticulosis without diverticulitis. Chronic changes as described above. Electronically Signed   By: Inez Catalina M.D.   On: 01/09/2019 22:52   Dg Chest 2 View  Result Date: 01/16/2019 CLINICAL DATA:  Pneumonia EXAM: CHEST - 2 VIEW COMPARISON:  01/09/2019 FINDINGS: The heart size is enlarged. There is some volume overload. There is no pneumothorax. No large pleural effusion. There is no acute osseous abnormality. The lung volumes are low. IMPRESSION: 1. Significant interval increase in size of the cardiac silhouette with developing volume overload. 2. No focal consolidation identified on today's exam. Electronically Signed   By: Constance Holster M.D.   On: 01/16/2019 20:38   Dg Chest Port 1 View  Result Date: 01/09/2019 CLINICAL DATA:  Chest pain for the past 3 days. EXAM: PORTABLE CHEST 1 VIEW COMPARISON:  CT chest and chest x-ray dated September 08, 2018. FINDINGS: The heart size and mediastinal contours are within normal limits. Normal pulmonary vascularity. No focal consolidation, pleural effusion, or pneumothorax. IMPRESSION: No active disease. Electronically  Signed   By: Titus Dubin M.D.   On: 01/09/2019 18:47       The results of significant diagnostics from this hospitalization (including imaging, microbiology, ancillary and laboratory) are listed below for reference.     Microbiology: Recent Results (from the past 240 hour(s))  SARS Coronavirus 2 (CEPHEID- Performed in [place] [facility] hospital lab), Hosp Order     Status: None   Collection Time: 01/09/19  6:26 PM  Result Value Ref Range Status   SARS Coronavirus 2 NEGATIVE NEGATIVE Final    Comment: (NOTE) If result is NEGATIVE SARS-CoV-2 target nucleic acids are NOT DETECTED. The SARS-CoV-2 RNA is generally detectable in upper and lower  respiratory specimens during the acute phase of infection. The lowest  concentration of SARS-CoV-2 viral copies this assay can detect is 250  copies / mL. A negative result does not preclude SARS-CoV-2 infection  and should not be used as the sole basis for treatment or other  patient management decisions.  A negative result may occur with  improper specimen collection / handling, submission of specimen other  than nasopharyngeal swab, presence of viral mutation(s) within the  areas targeted by this assay, and inadequate number of viral copies  (<250 copies / mL). A negative result must be combined with clinical  observations, patient history, and epidemiological information. If result is POSITIVE SARS-CoV-2 target nucleic acids are DETECTED. The SARS-CoV-2 RNA is generally detectable in upper and lower  respiratory specimens dur ing the acute phase of infection.  Positive  results are indicative of active infection with SARS-CoV-2.  Clinical  correlation with patient history and other diagnostic information is  necessary to determine patient infection status.  Positive results do  not rule out bacterial infection or co-infection with other viruses. If result is PRESUMPTIVE POSTIVE SARS-CoV-2 nucleic acids MAY BE PRESENT.   A presumptive  positive result was obtained on the submitted specimen  and confirmed on repeat testing.  While 2019 novel coronavirus  (SARS-CoV-2) nucleic acids may be present in the submitted sample  additional confirmatory testing may be necessary for epidemiological  and / or clinical management purposes  to differentiate between  SARS-CoV-2 and other Sarbecovirus currently known to infect humans.  If clinically indicated additional testing with an alternate test  methodology [phone]) is advised. The SARS-CoV-2 RNA is generally  detectable in upper and lower respiratory sp ecimens during the acute  phase of infection. The expected result is Negative. Fact Sheet for Patients:  StrictlyIdeas.no Fact Sheet for Healthcare Providers: BankingDealers.co.za This test is not yet approved or cleared by the [place] FDA and has been authorized for detection and/or diagnosis of SARS-CoV-2 by FDA under an Emergency Use Authorization (EUA).  This EUA will remain in effect (meaning this test can be used) for the duration of the COVID-19 declaration under Section 564(b)(1) of the Act, 21 U.S.C. section 360bbb-3(b)(1), unless the authorization is terminated or revoked sooner. Performed at [facility], [place] [address] [place], [place] [address]   Urine culture     Status: Abnormal   Collection Time: 01/09/19  9:31 PM  Result Value Ref Range Status   Specimen Description URINE, RANDOM  Final   Special Requests NONE  Final   Culture (A)  Final    <10,000 COLONIES/mL INSIGNIFICANT GROWTH Performed at [facility], [place] [address], [place], [place] [address]    Report Status 01/10/2019 FINAL  Final  Surgical pcr screen     Status: None   Collection Time: 01/11/19 10:18 PM  Result Value Ref Range Status   MRSA, PCR NEGATIVE NEGATIVE Final   Staphylococcus aureus NEGATIVE NEGATIVE Final    Comment: (NOTE) The Xpert SA Assay (FDA approved for NASAL  specimens in patients 30 years of age and older), is one component of a comprehensive surveillance program. It is not intended to diagnose infection nor to guide or monitor treatment. Performed at [facility], [place] [address], [place], [place] [address]      Labs: BNP (last 3 results) Recent Labs    07/13/18 2219 07/25/18 0021  BNP 146.6* 416.6*   Basic Metabolic Panel: Recent Labs  Lab 01/13/19 0215 01/15/19 0528 01/17/19 0312 01/18/19 0729  NA 141 144 144 142  K 3.4* 3.2* 3.1* 3.2*  CL 110 110 105 105  CO2 20* 23 28 26   GLUCOSE 104* 102* 105* 117*  BUN 21 12 15 12   CREATININE 2.04* 1.78* 1.62* 1.46*  CALCIUM 8.2* 8.1* 8.4* 8.9  MG  --   --   --  1.4*   Liver Function Tests: Recent Labs  Lab 01/18/19 0729  AST 36  ALT 29  ALKPHOS 54  BILITOT 0.6  PROT 6.5  ALBUMIN 3.1*   No results for input(s): LIPASE, AMYLASE in the last 168 hours. No results for input(s): AMMONIA in the last 168 hours. CBC: Recent Labs  Lab 01/14/19 0312 01/15/19 0528 01/16/19 0249 01/17/19 0312 01/18/19 0729  WBC 6.6 6.5 6.0 5.1 5.7  HGB 12.0* 11.7* 11.7* 11.8* 12.3*  HCT 36.2* 35.1* 35.6* 35.6* 37.4*  MCV 96.8 97.2 96.7 97.0 97.1  PLT 199 191 210 198 214   Cardiac Enzymes: No results for input(s): CKTOTAL, CKMB, CKMBINDEX, TROPONINI in the last 168 hours. BNP: Invalid input(s): POCBNP CBG: Recent Labs  Lab 01/18/19 0050 01/18/19 0447 01/18/19 0811 01/18/19 1208 01/18/19 1614  GLUCAP 118* 123* 123* 120* 118*   D-Dimer No results for input(s):  DDIMER in the last 72 hours. Hgb A1c No results for input(s): HGBA1C in the last 72 hours. Lipid Profile No results for input(s): CHOL, HDL, LDLCALC, TRIG, CHOLHDL, LDLDIRECT in the last 72 hours. Thyroid function studies No results for input(s): TSH, T4TOTAL, T3FREE, THYROIDAB in the last 72 hours.  Invalid input(s): FREET3 Anemia work up No results for input(s): VITAMINB12, FOLATE, FERRITIN, TIBC, IRON, RETICCTPCT  in the last 72 hours. Urinalysis    Component Value Date/Time   COLORURINE YELLOW 01/09/2019 2100   APPEARANCEUR HAZY (A) 01/09/2019 2100   LABSPEC 1.012 01/09/2019 2100   PHURINE 5.0 01/09/2019 2100   GLUCOSEU NEGATIVE 01/09/2019 2100   HGBUR SMALL (A) 01/09/2019 2100   BILIRUBINUR NEGATIVE 01/09/2019 2100   KETONESUR NEGATIVE 01/09/2019 2100   PROTEINUR NEGATIVE 01/09/2019 2100   UROBILINOGEN 0.2 12/24/2009 1944   NITRITE NEGATIVE 01/09/2019 2100   LEUKOCYTESUR NEGATIVE 01/09/2019 2100   Sepsis Labs Invalid input(s): PROCALCITONIN,  WBC,  LACTICIDVEN Microbiology Recent Results (from the past 240 hour(s))  SARS Coronavirus 2 (CEPHEID- Performed in [place] hospital lab), Hosp Order     Status: None   Collection Time: 01/09/19  6:26 PM  Result Value Ref Range Status   SARS Coronavirus 2 NEGATIVE NEGATIVE Final    Comment: (NOTE) If result is NEGATIVE SARS-CoV-2 target nucleic acids are NOT DETECTED. The SARS-CoV-2 RNA is generally detectable in upper and lower  respiratory specimens during the acute phase of infection. The lowest  concentration of SARS-CoV-2 viral copies this assay can detect is 250  copies / mL. A negative result does not preclude SARS-CoV-2 infection  and should not be used as the sole basis for treatment or other  patient management decisions.  A negative result may occur with  improper specimen collection / handling, submission of specimen other  than nasopharyngeal swab, presence of viral mutation(s) within the  areas targeted by this assay, and inadequate number of viral copies  (<250 copies / mL). A negative result must be combined with clinical  observations, patient history, and epidemiological information. If result is POSITIVE SARS-CoV-2 target nucleic acids are DETECTED. The SARS-CoV-2 RNA is generally detectable in upper and lower  respiratory specimens dur ing the acute phase of infection.  Positive  results are indicative of active  infection with SARS-CoV-2.  Clinical  correlation with patient history and other diagnostic information is  necessary to determine patient infection status.  Positive results do  not rule out bacterial infection or co-infection with other viruses. If result is PRESUMPTIVE POSTIVE SARS-CoV-2 nucleic acids MAY BE PRESENT.   A presumptive positive result was obtained on the submitted specimen  and confirmed on repeat testing.  While 2019 novel coronavirus  (SARS-CoV-2) nucleic acids may be present in the submitted sample  additional confirmatory testing may be necessary for epidemiological  and / or clinical management purposes  to differentiate between  SARS-CoV-2 and other Sarbecovirus currently known to infect humans.  If clinically indicated additional testing with an alternate test  methodology [phone]) is advised. The SARS-CoV-2 RNA is generally  detectable in upper and lower respiratory sp ecimens during the acute  phase of infection. The expected result is Negative. Fact Sheet for Patients:  StrictlyIdeas.no Fact Sheet for Healthcare Providers: BankingDealers.co.za This test is not yet approved or cleared by the [place] FDA and has been authorized for detection and/or diagnosis of SARS-CoV-2 by FDA under an Emergency Use Authorization (EUA).  This EUA will remain in effect (meaning this test can be  used) for the duration of the COVID-19 declaration under Section 564(b)(1) of the Act, 21 U.S.C. section 360bbb-3(b)(1), unless the authorization is terminated or revoked sooner. Performed at [facility], [place] [address], [place], [place] [address]   Urine culture     Status: Abnormal   Collection Time: 01/09/19  9:31 PM  Result Value Ref Range Status   Specimen Description URINE, RANDOM  Final   Special Requests NONE  Final   Culture (A)  Final    <10,000 COLONIES/mL INSIGNIFICANT GROWTH Performed at [place], [place] [address] [place], [place] [address]    Report Status 01/10/2019 FINAL  Final  Surgical pcr screen     Status: None   Collection Time: 01/11/19 10:18 PM  Result Value Ref Range Status   MRSA, PCR NEGATIVE NEGATIVE Final   Staphylococcus aureus NEGATIVE NEGATIVE Final    Comment: (NOTE) The Xpert SA Assay (FDA approved for NASAL specimens in patients 41 years of age and older), is one component of a comprehensive surveillance program. It is not intended to diagnose infection nor to guide or monitor treatment. Performed at [facility], [place] [address], [place], [place] [address]      SIGNED:   Aline August, MD  [facility] 01/19/2019, 10:28 AM

## 2019-01-28 ENCOUNTER — Other Ambulatory Visit: Payer: Self-pay

## 2019-01-28 ENCOUNTER — Emergency Department (HOSPITAL_COMMUNITY)
Admission: EM | Admit: 2019-01-28 | Discharge: 2019-01-28 | Disposition: A | Discharge status: Home / Self Care | Payer: Medicare Other | Attending: Emergency Medicine | Admitting: Emergency Medicine

## 2019-01-28 DIAGNOSIS — F10920 Alcohol use, unspecified with intoxication, uncomplicated: Secondary | ICD-10-CM | POA: Diagnosis present

## 2019-01-28 DIAGNOSIS — R69 Illness, unspecified: Secondary | ICD-10-CM

## 2019-01-28 DIAGNOSIS — Z794 Long term (current) use of insulin: Secondary | ICD-10-CM | POA: Insufficient documentation

## 2019-01-28 DIAGNOSIS — F17200 Nicotine dependence, unspecified, uncomplicated: Secondary | ICD-10-CM | POA: Diagnosis not present

## 2019-01-28 DIAGNOSIS — E119 Type 2 diabetes mellitus without complications: Secondary | ICD-10-CM | POA: Insufficient documentation

## 2019-01-28 DIAGNOSIS — Z79899 Other long term (current) drug therapy: Secondary | ICD-10-CM | POA: Insufficient documentation

## 2019-01-28 NOTE — Discharge Instructions (Addendum)
1.  You report drinking alcohol today in a fairly large amount.  You have significant medical problems that need attention.  See your doctor as soon as possible this week for recheck. 2.  Return to the emergency department immediately if you have concerning symptoms.

## 2019-01-28 NOTE — ED Triage Notes (Signed)
Ems originally called for cardiac arrest , patients friend thought he wasn't breathing , upon ems arrival patient was breathing fine but was highly intoxicated ; pt only alert to self at this time ;

## 2019-01-28 NOTE — ED Provider Notes (Signed)
[facility] EMERGENCY DEPARTMENT Provider Note   CSN: [id number] Arrival date & time: 01/28/19  1758     History   Chief Complaint Chief Complaint  Patient presents with  . Alcohol Intoxication    HPI Todd [name]. is a 63 y.o. male.     HPI The time of my evaluation, the patient is very jovial and interactive.  He adamantly insists that the whole problem was because he drank way too much alcohol.  He reports he drank two 40 ounce malt liquors and probably a Schlitz as well.  He reports he is really been trying to stop drinking but he is upset about his wife left approximately 5 months ago by his report.  He reports there is nothing else wrong with him today.  He reports things just got out of control with the drinking.  He reports he passed out from it but denies he was having any problems with chest pain, shortness of breath, headache.  He reports he feels fine now.  He reports he very much wants to go home and is calling for a ride.  He states that he has a follow-up appointment with his doctor this week.  Reports he already had a lot of lab work done and they are doing some follow-up lab work within the next 2 days.  He reports he does not want any more labs done and does not need them. Past Medical History:  Diagnosis Date  . Arthritis    "fingers" (07/14/2018)  . Depression   . DVT (deep venous thrombosis) (HCC) LLE  . Hepatitis C     finished harvoni tx ~ 2017  . Hypercholesterolemia   . Hypertension   . Prostate cancer ([place]) 52yrs ago  . Pulmonary embolism and infarction ([place]) 07/13/2018  . Sleep apnea    not currently using cpap, mask causing vertigo  . Type II diabetes mellitus [facility])     Patient Active Problem List   Diagnosis Date Noted  . Acute kidney failure ([place]) 01/09/2019  . Pulmonary embolus and infarction ([place]) 07/13/2018  . Opioid use disorder, moderate, dependence ([place]) 06/29/2018  . Alcohol use disorder, severe, dependence ([place])  06/27/2018  . MDD (major depressive disorder), recurrent severe, without psychosis ([place]) 06/26/2018  . Varicose veins of left lower extremity with complications 40/98/1191  . Benign paroxysmal positional vertigo 11/08/2013  . RBBB 06/02/2009  . 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 DM (diabetes mellitus), type 2 ([place]) 04/11/2009  . OBESITY 04/11/2009  . DEPRESSION 04/11/2009  . Essential hypertension 04/11/2009  . DIVERTICULAR DISEASE 04/11/2009  . CHOLECYSTITIS 04/11/2009  . CHEST PAIN 04/11/2009  . ABDOMINAL PAIN 04/11/2009    Past Surgical History:  Procedure Laterality Date  . BIOPSY  01/12/2019   Procedure: BIOPSY;  Surgeon: Ronald Lobo, MD;  Location: [place];  Service: Endoscopy;;  . COLONOSCOPY WITH PROPOFOL N/A 02/19/2014   Procedure: COLONOSCOPY WITH PROPOFOL;  Surgeon: Garlan Fair, MD;  [facility] ENDOSCOPY;  Service: Endoscopy;  Laterality: N/A;  . ENDOVENOUS ABLATION SAPHENOUS VEIN W/ LASER Left 11/22/2017   endovenous laser ablation L SSV by Tinnie Gens MD   . ESOPHAGOGASTRODUODENOSCOPY (EGD) WITH PROPOFOL N/A 01/12/2019   Procedure: ESOPHAGOGASTRODUODENOSCOPY (EGD) WITH PROPOFOL;  Surgeon: Ronald Lobo, MD;  Location: [facility] ENDOSCOPY;  Service: Endoscopy;  Laterality: N/A;  Patient is also scheduled for barium swallow; please notify radiology after patient's EGD is complete so that barium swallow follows the endoscopy, not vice versa  . PROSTATECTOMY  2008  . REPAIR QUADRICEPS / HAMSTRING MUSCLE Right         Home Medications    Prior to Admission medications   Medication Sig Start Date End Date Taking? Authorizing Provider  atorvastatin (LIPITOR) 10 MG tablet Take 10 mg by mouth daily at 6 PM.  11/19/17   [provider]  DULoxetine (CYMBALTA) 60 MG capsule Take 1 capsule (60 mg total) by mouth daily. Patient not taking: Reported on 01/09/2019 07/05/18   Pennelope Bracken, MD  ELIQUIS STARTER PACK (ELIQUIS STARTER PACK) 5 MG TABS Take  as directed on package: start with two-5mg  tablets twice daily for 7 days. On day 8, switch to one-5mg  tablet twice daily. Patient not taking: Reported on 01/09/2019 07/14/18   Patrecia Pour, MD  HYDROcodone-acetaminophen (NORCO/VICODIN) 5-325 MG tablet Take 2 tablets by mouth every 4 (four) hours as needed for severe pain. Patient not taking: Reported on 01/09/2019 09/08/18   Blanchie Dessert, MD  hydrOXYzine (ATARAX/VISTARIL) 50 MG tablet Take 1 tablet (50 mg total) by mouth every 6 (six) hours as needed for anxiety. Patient not taking: Reported on 01/09/2019 07/04/18   Pennelope Bracken, MD  lidocaine (LIDODERM) 5 % Place 1 patch onto the skin daily. Remove & Discard patch within 12 hours or as directed by MD Patient not taking: Reported on 01/09/2019 09/08/18   Blanchie Dessert, MD  losartan (COZAAR) 50 MG tablet Take 50 mg by mouth daily. 06/15/18   [provider]  metFORMIN (GLUCOPHAGE) 1000 MG tablet Take 1,000 mg by mouth 2 (two) times daily with a meal.    [provider]  Multiple Vitamins-Minerals (CENTRUM SILVER 50+MEN) TABS Take 1 tablet by mouth daily with breakfast.    [provider]  sertraline (ZOLOFT) 100 MG tablet Take 100 mg by mouth every morning.    [provider]  tamsulosin (FLOMAX) 0.4 MG CAPS capsule Take 0.4 mg by mouth every evening.    [provider]  traMADol (ULTRAM) 50 MG tablet Take 2 tablets (100 mg total) by mouth every 6 (six) hours as needed for moderate pain. Patient not taking: Reported on 01/09/2019 07/14/18   Patrecia Pour, MD  traZODone (DESYREL) 100 MG tablet Take 1 tablet (100 mg total) by mouth at bedtime. Patient taking differently: Take 200 mg by mouth at bedtime.  07/04/18   Pennelope Bracken, MD    Family History Family History  Problem Relation Age of Onset  . Cancer Father        PROSTATE    Social History Social History   Tobacco Use  . Smoking status: Current Every Day Smoker     Packs/day: 0.12    Years: 45.00    Pack years: 5.40  . Smokeless tobacco: Never Used  Substance Use Topics  . Alcohol use: Yes    Comment: 07/14/2018 "40oz beer/day"  . 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   Review of Systems Review of Systems 10 Systems reviewed and are negative for acute change except as noted in the HPI.   Physical Exam Updated Vital Signs BP (!) 135/100   Pulse (!) 106   Temp 98.9 F (37.2 C) (Oral)   Resp 19   SpO2 93%   Physical Exam Constitutional:      Comments: Patient is alert.  He is very gregarious and shows no signs of distress.  No respiratory distress.  HENT:     Head: Normocephalic and atraumatic.     Mouth/Throat:     Mouth: Mucous membranes are moist.     Pharynx: Oropharynx is clear.  Eyes:     Extraocular Movements: Extraocular movements intact.     Pupils: Pupils are equal, round, and reactive to light.  Neck:     Musculoskeletal: Neck supple.  Cardiovascular:     Rate and Rhythm: Normal rate and regular rhythm.  Pulmonary:     Effort: Pulmonary effort is normal.     Breath sounds: Normal breath sounds.  Abdominal:     General: There is no distension.     Palpations: Abdomen is soft.     Tenderness: There is no abdominal tenderness. There is no guarding.  Musculoskeletal: Normal range of motion.        General: No swelling, tenderness or deformity.     Right lower leg: No edema.     Left lower leg: No edema.  Skin:    General: Skin is warm and dry.  Neurological:     General: No focal deficit present.     Mental Status: He is oriented to person, place, and time.     Cranial Nerves: No cranial nerve deficit.     Coordination: Coordination normal.     Gait: Gait normal.     Comments: Patient has been ambulatory about the emergency department to go the bathroom.  Gait is coordinated and symmetric.  Psychiatric:        Mood and Affect: Mood normal.       ED Treatments / Results  Labs (all labs ordered are listed, but only abnormal results are displayed) Labs Reviewed - No data to display  EKG    Radiology No results found.  Procedures Procedures (including critical care time)  Medications Ordered in ED Medications - No data to display   Initial Impression / Assessment and Plan / ED Course  I have reviewed the triage vital signs and the nursing notes.  Pertinent labs & imaging results that were available during my care of the patient were reviewed by me and considered in my medical decision making (see chart for details).       Reportedly a friend thought the patient might be unresponsive and called EMS.  On EMS arrival patient was breathing without difficulty and very intoxicated.  He was interacting with medics.  Patient insists the whole event was due to drinking too much alcohol.  He is remorseful but mood is very elevated.  Patient is insistent at this time that he does not want or need any additional blood work or diagnostic studies.  He advises that he just had a complete hospitalization work-up and does not want any further testing currently.  By the time I had just completed speaking with him he was calling a friend for a ride.  Patient is aware of risks of comorbid illness but at this time has capacity for decision-making he is alert, he is oriented and is neurologically intact.  Patient is counseled on returning immediately should any concerning symptoms develop.  Expresses intended to be compliant with follow-up appointments this week.  Final Clinical Impressions(s) / ED Diagnoses   Final diagnoses:  Alcoholic intoxication without complication ([place])  Severe comorbid illness    ED Discharge Orders    None       Charlesetta Shanks, MD 01/28/19 2016

## 2019-04-03 ENCOUNTER — Emergency Department (HOSPITAL_COMMUNITY): Payer: Medicare Other

## 2019-04-03 ENCOUNTER — Other Ambulatory Visit: Payer: Self-pay

## 2019-04-03 ENCOUNTER — Inpatient Hospital Stay (HOSPITAL_COMMUNITY)
Admission: EM | Admit: 2019-04-03 | Discharge: 2019-04-06 | DRG: 070 | Disposition: A | Discharge status: Home / Self Care | Payer: Medicare Other | Attending: Internal Medicine | Admitting: Internal Medicine

## 2019-04-03 ENCOUNTER — Encounter (HOSPITAL_COMMUNITY): Payer: Self-pay | Admitting: Emergency Medicine

## 2019-04-03 DIAGNOSIS — E876 Hypokalemia: Secondary | ICD-10-CM | POA: Diagnosis not present

## 2019-04-03 DIAGNOSIS — F10129 Alcohol abuse with intoxication, unspecified: Secondary | ICD-10-CM | POA: Diagnosis present

## 2019-04-03 DIAGNOSIS — F17210 Nicotine dependence, cigarettes, uncomplicated: Secondary | ICD-10-CM | POA: Diagnosis present

## 2019-04-03 DIAGNOSIS — I480 Paroxysmal atrial fibrillation: Secondary | ICD-10-CM | POA: Diagnosis not present

## 2019-04-03 DIAGNOSIS — M19041 Primary osteoarthritis, right hand: Secondary | ICD-10-CM | POA: Diagnosis present

## 2019-04-03 DIAGNOSIS — F329 Major depressive disorder, single episode, unspecified: Secondary | ICD-10-CM | POA: Diagnosis present

## 2019-04-03 DIAGNOSIS — E869 Volume depletion, unspecified: Secondary | ICD-10-CM | POA: Diagnosis present

## 2019-04-03 DIAGNOSIS — N170 Acute kidney failure with tubular necrosis: Secondary | ICD-10-CM | POA: Diagnosis present

## 2019-04-03 DIAGNOSIS — E785 Hyperlipidemia, unspecified: Secondary | ICD-10-CM | POA: Diagnosis present

## 2019-04-03 DIAGNOSIS — R4182 Altered mental status, unspecified: Secondary | ICD-10-CM | POA: Diagnosis present

## 2019-04-03 DIAGNOSIS — N183 Chronic kidney disease, stage 3 (moderate): Secondary | ICD-10-CM | POA: Diagnosis present

## 2019-04-03 DIAGNOSIS — I471 Supraventricular tachycardia: Secondary | ICD-10-CM | POA: Diagnosis present

## 2019-04-03 DIAGNOSIS — Q249 Congenital malformation of heart, unspecified: Secondary | ICD-10-CM | POA: Diagnosis not present

## 2019-04-03 DIAGNOSIS — Z20828 Contact with and (suspected) exposure to other viral communicable diseases: Secondary | ICD-10-CM | POA: Diagnosis present

## 2019-04-03 DIAGNOSIS — Z8042 Family history of malignant neoplasm of prostate: Secondary | ICD-10-CM

## 2019-04-03 DIAGNOSIS — Z8546 Personal history of malignant neoplasm of prostate: Secondary | ICD-10-CM

## 2019-04-03 DIAGNOSIS — M19042 Primary osteoarthritis, left hand: Secondary | ICD-10-CM | POA: Diagnosis present

## 2019-04-03 DIAGNOSIS — G9341 Metabolic encephalopathy: Principal | ICD-10-CM | POA: Diagnosis present

## 2019-04-03 DIAGNOSIS — E1122 Type 2 diabetes mellitus with diabetic chronic kidney disease: Secondary | ICD-10-CM | POA: Diagnosis present

## 2019-04-03 DIAGNOSIS — E872 Acidosis: Secondary | ICD-10-CM | POA: Diagnosis present

## 2019-04-03 DIAGNOSIS — I4892 Unspecified atrial flutter: Secondary | ICD-10-CM | POA: Diagnosis present

## 2019-04-03 DIAGNOSIS — I4510 Unspecified right bundle-branch block: Secondary | ICD-10-CM | POA: Diagnosis present

## 2019-04-03 DIAGNOSIS — N179 Acute kidney failure, unspecified: Secondary | ICD-10-CM

## 2019-04-03 DIAGNOSIS — I129 Hypertensive chronic kidney disease with stage 1 through stage 4 chronic kidney disease, or unspecified chronic kidney disease: Secondary | ICD-10-CM | POA: Diagnosis present

## 2019-04-03 DIAGNOSIS — E7800 Pure hypercholesterolemia, unspecified: Secondary | ICD-10-CM | POA: Diagnosis present

## 2019-04-03 DIAGNOSIS — Y903 Blood alcohol level of 60-79 mg/100 ml: Secondary | ICD-10-CM | POA: Diagnosis present

## 2019-04-03 DIAGNOSIS — Z7984 Long term (current) use of oral hypoglycemic drugs: Secondary | ICD-10-CM | POA: Diagnosis not present

## 2019-04-03 DIAGNOSIS — Z86718 Personal history of other venous thrombosis and embolism: Secondary | ICD-10-CM

## 2019-04-03 DIAGNOSIS — F1410 Cocaine abuse, uncomplicated: Secondary | ICD-10-CM | POA: Diagnosis present

## 2019-04-03 DIAGNOSIS — Z86711 Personal history of pulmonary embolism: Secondary | ICD-10-CM | POA: Diagnosis not present

## 2019-04-03 DIAGNOSIS — Z8619 Personal history of other infectious and parasitic diseases: Secondary | ICD-10-CM

## 2019-04-03 DIAGNOSIS — F1910 Other psychoactive substance abuse, uncomplicated: Secondary | ICD-10-CM | POA: Diagnosis not present

## 2019-04-03 DIAGNOSIS — K769 Liver disease, unspecified: Secondary | ICD-10-CM | POA: Diagnosis present

## 2019-04-03 DIAGNOSIS — I483 Typical atrial flutter: Secondary | ICD-10-CM | POA: Diagnosis not present

## 2019-04-03 DIAGNOSIS — Z6834 Body mass index (BMI) 34.0-34.9, adult: Secondary | ICD-10-CM

## 2019-04-03 DIAGNOSIS — G4733 Obstructive sleep apnea (adult) (pediatric): Secondary | ICD-10-CM | POA: Diagnosis present

## 2019-04-03 DIAGNOSIS — E669 Obesity, unspecified: Secondary | ICD-10-CM | POA: Diagnosis present

## 2019-04-03 LAB — URINALYSIS, COMPLETE (UACMP) WITH MICROSCOPIC
Bilirubin Urine: NEGATIVE
Glucose, UA: 50 mg/dL — AB
Ketones, ur: NEGATIVE mg/dL
Leukocytes,Ua: NEGATIVE
Nitrite: NEGATIVE
Protein, ur: 100 mg/dL — AB
Specific Gravity, Urine: 1.01 (ref 1.005–1.030)
pH: 6 (ref 5.0–8.0)

## 2019-04-03 LAB — PROTIME-INR
INR: 1.2 (ref 0.8–1.2)
Prothrombin Time: 14.6 seconds (ref 11.4–15.2)

## 2019-04-03 LAB — POCT I-STAT EG7
Acid-base deficit: 13 mmol/L — ABNORMAL HIGH (ref 0.0–2.0)
Bicarbonate: 12.4 mmol/L — ABNORMAL LOW (ref 20.0–28.0)
Calcium, Ion: 1.09 mmol/L — ABNORMAL LOW (ref 1.15–1.40)
HCT: 46 % (ref 39.0–52.0)
Hemoglobin: 15.6 g/dL (ref 13.0–17.0)
O2 Saturation: 98 %
Potassium: 3.2 mmol/L — ABNORMAL LOW (ref 3.5–5.1)
Sodium: 142 mmol/L (ref 135–145)
TCO2: 13 mmol/L — ABNORMAL LOW (ref 22–32)
pCO2, Ven: 28.1 mmHg — ABNORMAL LOW (ref 44.0–60.0)
pH, Ven: 7.252 (ref 7.250–7.430)
pO2, Ven: 111 mmHg — ABNORMAL HIGH (ref 32.0–45.0)

## 2019-04-03 LAB — CBG MONITORING, ED: Glucose-Capillary: 226 mg/dL — ABNORMAL HIGH (ref 70–99)

## 2019-04-03 LAB — COMPREHENSIVE METABOLIC PANEL
ALT: 100 U/L — ABNORMAL HIGH (ref 0–44)
AST: 391 U/L — ABNORMAL HIGH (ref 15–41)
Albumin: 4.2 g/dL (ref 3.5–5.0)
Alkaline Phosphatase: 89 U/L (ref 38–126)
Anion gap: 20 — ABNORMAL HIGH (ref 5–15)
BUN: 12 mg/dL (ref 8–23)
CO2: 14 mmol/L — ABNORMAL LOW (ref 22–32)
Calcium: 9.2 mg/dL (ref 8.9–10.3)
Chloride: 106 mmol/L (ref 98–111)
Creatinine, Ser: 2.19 mg/dL — ABNORMAL HIGH (ref 0.61–1.24)
GFR calc Af Amer: 36 mL/min — ABNORMAL LOW (ref >60)
GFR calc non Af Amer: 31 mL/min — ABNORMAL LOW (ref >60)
Glucose, Bld: 238 mg/dL — ABNORMAL HIGH (ref 70–99)
Potassium: 3.9 mmol/L (ref 3.5–5.1)
Sodium: 140 mmol/L (ref 135–145)
Total Bilirubin: 0.8 mg/dL (ref 0.3–1.2)
Total Protein: 7.8 g/dL (ref 6.5–8.1)

## 2019-04-03 LAB — CBC WITH DIFFERENTIAL/PLATELET
Abs Immature Granulocytes: 0.01 10*3/uL (ref 0.00–0.07)
Basophils Absolute: 0 10*3/uL (ref 0.0–0.1)
Basophils Relative: 0 %
Eosinophils Absolute: 0 10*3/uL (ref 0.0–0.5)
Eosinophils Relative: 1 %
HCT: 48.8 % (ref 39.0–52.0)
Hemoglobin: 15.5 g/dL (ref 13.0–17.0)
Immature Granulocytes: 0 %
Lymphocytes Relative: 47 %
Lymphs Abs: 2.5 10*3/uL (ref 0.7–4.0)
MCH: 32.3 pg (ref 26.0–34.0)
MCHC: 31.8 g/dL (ref 30.0–36.0)
MCV: 101.7 fL — ABNORMAL HIGH (ref 80.0–100.0)
Monocytes Absolute: 0.3 10*3/uL (ref 0.1–1.0)
Monocytes Relative: 6 %
Neutro Abs: 2.4 10*3/uL (ref 1.7–7.7)
Neutrophils Relative %: 46 %
Platelets: 224 10*3/uL (ref 150–400)
RBC: 4.8 MIL/uL (ref 4.22–5.81)
RDW: 13.1 % (ref 11.5–15.5)
WBC: 5.3 10*3/uL (ref 4.0–10.5)
nRBC: 0 % (ref 0.0–0.2)

## 2019-04-03 LAB — GLUCOSE, CAPILLARY: Glucose-Capillary: 102 mg/dL — ABNORMAL HIGH (ref 70–99)

## 2019-04-03 LAB — AMMONIA: Ammonia: 57 umol/L — ABNORMAL HIGH (ref 9–35)

## 2019-04-03 LAB — RAPID URINE DRUG SCREEN, HOSP PERFORMED
Amphetamines: NOT DETECTED
Barbiturates: NOT DETECTED
Benzodiazepines: NOT DETECTED
Cocaine: POSITIVE — AB
Opiates: POSITIVE — AB
Tetrahydrocannabinol: NOT DETECTED

## 2019-04-03 LAB — SARS CORONAVIRUS 2 BY RT PCR (HOSPITAL ORDER, PERFORMED IN ~~LOC~~ HOSPITAL LAB): SARS Coronavirus 2: NEGATIVE

## 2019-04-03 LAB — CK: Total CK: 141 U/L (ref 49–397)

## 2019-04-03 LAB — ETHANOL: Alcohol, Ethyl (B): 70 mg/dL — ABNORMAL HIGH (ref <10)

## 2019-04-03 LAB — TROPONIN I (HIGH SENSITIVITY)
Troponin I (High Sensitivity): 93 ng/L — ABNORMAL HIGH (ref <18)
Troponin I (High Sensitivity): 105 ng/L (ref <18)

## 2019-04-03 LAB — LACTIC ACID, PLASMA
Lactic Acid, Venous: 4.2 mmol/L (ref 0.5–1.9)
Lactic Acid, Venous: 2.1 mmol/L (ref 0.5–1.9)
Lactic Acid, Venous: 1.7 mmol/L (ref 0.5–1.9)

## 2019-04-03 MED ORDER — SODIUM CHLORIDE 0.9 % IV BOLUS
1000.0000 mL | Freq: Once | INTRAVENOUS | Status: AC
  Administered 2019-04-03: 1000 mL via INTRAVENOUS

## 2019-04-03 MED ORDER — METOPROLOL TARTRATE 25 MG PO TABS
25.0000 mg | ORAL_TABLET | Freq: Three times a day (TID) | ORAL | Status: DC
  Administered 2019-04-04: 25 mg via ORAL
  Filled 2019-04-03: qty 1

## 2019-04-03 MED ORDER — DILTIAZEM LOAD VIA INFUSION
15.0000 mg | Freq: Once | INTRAVENOUS | Status: AC
  Administered 2019-04-03: 15 mg via INTRAVENOUS
  Filled 2019-04-03: qty 15

## 2019-04-03 MED ORDER — ADENOSINE 6 MG/2ML IV SOLN
INTRAVENOUS | Status: AC
  Filled 2019-04-03: qty 6

## 2019-04-03 MED ORDER — ADENOSINE 6 MG/2ML IV SOLN
6.0000 mg | Freq: Once | INTRAVENOUS | Status: AC
  Administered 2019-04-03: 6 mg via INTRAVENOUS
  Filled 2019-04-03: qty 2

## 2019-04-03 MED ORDER — ADENOSINE 6 MG/2ML IV SOLN
12.0000 mg | Freq: Once | INTRAVENOUS | Status: AC
  Administered 2019-04-03: 16:00:00 12 mg via INTRAVENOUS

## 2019-04-03 MED ORDER — LORAZEPAM 2 MG/ML IJ SOLN
1.0000 mg | Freq: Once | INTRAMUSCULAR | Status: AC
  Administered 2019-04-03: 1 mg via INTRAVENOUS
  Filled 2019-04-03: qty 1

## 2019-04-03 MED ORDER — DILTIAZEM HCL-DEXTROSE 100-5 MG/100ML-% IV SOLN (PREMIX)
5.0000 mg/h | INTRAVENOUS | Status: DC
  Administered 2019-04-03: 5 mg/h via INTRAVENOUS
  Administered 2019-04-03 – 2019-04-04 (×2): 15 mg/h via INTRAVENOUS
  Filled 2019-04-03 (×5): qty 100

## 2019-04-03 MED ORDER — TETANUS-DIPHTH-ACELL PERTUSSIS 5-2.5-18.5 LF-MCG/0.5 IM SUSP: 0.5000 mL | Freq: Once | INTRAMUSCULAR | Status: DC

## 2019-04-03 MED ORDER — METOPROLOL TARTRATE 5 MG/5ML IV SOLN
5.0000 mg | Freq: Once | INTRAVENOUS | Status: AC
  Administered 2019-04-03: 5 mg via INTRAVENOUS
  Filled 2019-04-03: qty 5

## 2019-04-03 MED ORDER — THIAMINE HCL 100 MG/ML IJ SOLN
INTRAVENOUS | Status: AC
  Administered 2019-04-04: 01:00:00 via INTRAVENOUS
  Filled 2019-04-03: qty 1000

## 2019-04-03 MED ORDER — HEPARIN SODIUM (PORCINE) 5000 UNIT/ML IJ SOLN
5000.0000 [IU] | Freq: Three times a day (TID) | INTRAMUSCULAR | Status: DC
  Administered 2019-04-03 – 2019-04-06 (×8): 5000 [IU] via SUBCUTANEOUS
  Filled 2019-04-03 (×8): qty 1

## 2019-04-03 MED ORDER — NALOXONE HCL 2 MG/2ML IJ SOSY
1.0000 mg | PREFILLED_SYRINGE | Freq: Once | INTRAMUSCULAR | Status: AC
  Administered 2019-04-03: 1 mg via INTRAVENOUS
  Filled 2019-04-03: qty 2

## 2019-04-03 MED ORDER — SODIUM CHLORIDE 0.9 % IV SOLN
INTRAVENOUS | Status: DC
  Administered 2019-04-03 – 2019-04-05 (×5): via INTRAVENOUS

## 2019-04-03 MED ORDER — ADENOSINE 6 MG/2ML IV SOLN
18.0000 mg | Freq: Once | INTRAVENOUS | Status: AC
  Administered 2019-04-03: 16:00:00 18 mg via INTRAVENOUS

## 2019-04-03 MED ORDER — NALOXONE HCL 4 MG/10ML IJ SOLN
1.0000 mg/h | INTRAVENOUS | Status: DC
  Administered 2019-04-03: 1 mg/h via INTRAVENOUS
  Filled 2019-04-03: qty 10

## 2019-04-03 NOTE — ED Provider Notes (Addendum)
[facility] EMERGENCY DEPARTMENT Provider Note   CSN: [id number] Arrival date & time: 04/03/19  1403    History   Chief Complaint Chief Complaint  Patient presents with  . Altered Mental Status    HPI Todd [name]. is a 63 y.o. male with history of type 2 diabetes, DVT, pulmonary embolism anticoagulated on Eliquis, hypertension, hepatitis C treated who presents with altered mental status.  Patient was found down outside of his apartment unconscious.  Patient's saturating 85% on room air and was placed on nonrebreather.  He is a known alcoholic.  Neighbors state that he was intoxicated.  He admits to smoking crack cocaine last night and drinking lots of beer.  He does not quantify.  Upon further questioning, he also states using 40 g of heroin earlier today.  Patient does not remember anything that happened, although he does remember falling and hitting his head.  He denies any chest pain, abdominal pain, nausea, vomiting.     HPI  Past Medical History:  Diagnosis Date  . Arthritis    "fingers" (07/14/2018)  . Depression   . DVT (deep venous thrombosis) (HCC) LLE  . Hepatitis C     finished harvoni tx ~ 2017  . Hypercholesterolemia   . Hypertension   . Prostate cancer ([place]) 67yrs ago  . Pulmonary embolism and infarction ([place]) 07/13/2018  . Sleep apnea    not currently using cpap, mask causing vertigo  . Type II diabetes mellitus [facility])     Patient Active Problem List   Diagnosis Date Noted  . Acute kidney failure ([place]) 01/09/2019  . Pulmonary embolus and infarction ([place]) 07/13/2018  . Opioid use disorder, moderate, dependence ([place]) 06/29/2018  . Alcohol use disorder, severe, dependence ([place]) 06/27/2018  . MDD (major depressive disorder), recurrent severe, without psychosis ([place]) 06/26/2018  . Varicose veins of left lower extremity with complications 55/73/2202  . Benign paroxysmal positional vertigo 11/08/2013  . RBBB 06/02/2009  . CONGENITAL HEART  DISEASE 06/02/2009  . DM (diabetes mellitus), type 2 ([place]) 04/11/2009  . OBESITY 04/11/2009  . DEPRESSION 04/11/2009  . Essential hypertension 04/11/2009  . DIVERTICULAR DISEASE 04/11/2009  . CHOLECYSTITIS 04/11/2009  . CHEST PAIN 04/11/2009  . ABDOMINAL PAIN 04/11/2009    Past Surgical History:  Procedure Laterality Date  . BIOPSY  01/12/2019   Procedure: BIOPSY;  Surgeon: Ronald Lobo, MD;  Location: [place];  Service: Endoscopy;;  . COLONOSCOPY WITH PROPOFOL N/A 02/19/2014   Procedure: COLONOSCOPY WITH PROPOFOL;  Surgeon: Garlan Fair, MD;  [facility] ENDOSCOPY;  Service: Endoscopy;  Laterality: N/A;  . ENDOVENOUS ABLATION SAPHENOUS VEIN W/ LASER Left 11/22/2017   endovenous laser ablation L SSV by Tinnie Gens MD   . ESOPHAGOGASTRODUODENOSCOPY (EGD) WITH PROPOFOL N/A 01/12/2019   Procedure: ESOPHAGOGASTRODUODENOSCOPY (EGD) WITH PROPOFOL;  Surgeon: Ronald Lobo, MD;  Location: [facility] ENDOSCOPY;  Service: Endoscopy;  Laterality: N/A;  Patient is also scheduled for barium swallow; please notify radiology after patient's EGD is complete so that barium swallow follows the endoscopy, not vice versa  . PROSTATECTOMY  2008  . REPAIR QUADRICEPS / HAMSTRING MUSCLE Right         Home Medications    Prior to Admission medications   Medication Sig Start Date End Date Taking? Authorizing Provider  atorvastatin (LIPITOR) 10 MG tablet Take 10 mg by mouth daily at 6 PM.  11/19/17   [provider]  DULoxetine (CYMBALTA) 60 MG capsule Take 1 capsule (60 mg total) by mouth daily.  Patient not taking: Reported on 01/09/2019 07/05/18   Pennelope Bracken, MD  ELIQUIS STARTER PACK (ELIQUIS STARTER PACK) 5 MG TABS Take as directed on package: start with two-5mg  tablets twice daily for 7 days. On day 8, switch to one-5mg  tablet twice daily. Patient not taking: Reported on 01/09/2019 07/14/18   Patrecia Pour, MD  HYDROcodone-acetaminophen (NORCO/VICODIN) 5-325 MG tablet Take 2  tablets by mouth every 4 (four) hours as needed for severe pain. Patient not taking: Reported on 01/09/2019 09/08/18   Blanchie Dessert, MD  hydrOXYzine (ATARAX/VISTARIL) 50 MG tablet Take 1 tablet (50 mg total) by mouth every 6 (six) hours as needed for anxiety. Patient not taking: Reported on 01/09/2019 07/04/18   Pennelope Bracken, MD  lidocaine (LIDODERM) 5 % Place 1 patch onto the skin daily. Remove & Discard patch within 12 hours or as directed by MD Patient not taking: Reported on 01/09/2019 09/08/18   Blanchie Dessert, MD  losartan (COZAAR) 50 MG tablet Take 50 mg by mouth daily. 06/15/18   [provider]  metFORMIN (GLUCOPHAGE) 1000 MG tablet Take 1,000 mg by mouth 2 (two) times daily with a meal.    [provider]  Multiple Vitamins-Minerals (CENTRUM SILVER 50+MEN) TABS Take 1 tablet by mouth daily with breakfast.    [provider]  sertraline (ZOLOFT) 100 MG tablet Take 100 mg by mouth every morning.    [provider]  tamsulosin (FLOMAX) 0.4 MG CAPS capsule Take 0.4 mg by mouth every evening.    [provider]  traMADol (ULTRAM) 50 MG tablet Take 2 tablets (100 mg total) by mouth every 6 (six) hours as needed for moderate pain. Patient not taking: Reported on 01/09/2019 07/14/18   Patrecia Pour, MD  traZODone (DESYREL) 100 MG tablet Take 1 tablet (100 mg total) by mouth at bedtime. Patient taking differently: Take 200 mg by mouth at bedtime.  07/04/18   Pennelope Bracken, MD    Family History Family History  Problem Relation Age of Onset  . Cancer Father        PROSTATE    Social History Social History   Tobacco Use  . Smoking status: Current Every Day Smoker    Packs/day: 0.12    Years: 45.00    Pack years: 5.40  . Smokeless tobacco: Never Used  Substance Use Topics  . Alcohol use: Yes    Comment: 07/14/2018 "40oz beer/day"  . Drug use: Not Currently    Comment: in recovery      Allergies   Patient has  no known allergies.   Review of Systems Review of Systems  Constitutional: Positive for diaphoresis. Negative for chills and fever.  HENT: Negative for facial swelling and sore throat.   Respiratory: Negative for shortness of breath.   Cardiovascular: Negative for chest pain.  Gastrointestinal: Negative for abdominal pain, nausea and vomiting.  Genitourinary: Negative for dysuria.  Musculoskeletal: Negative for back pain.  Skin: Positive for wound. Negative for rash.  Neurological: Positive for syncope and headaches.  Psychiatric/Behavioral: The patient is not nervous/anxious.      Physical Exam Updated Vital Signs BP (!) 175/101   Pulse 99   Temp 97.8 F (36.6 C) (Oral)   Resp 15   SpO2 95%   Physical Exam Vitals signs and nursing note reviewed.  Constitutional:      General: He is not in acute distress.    Appearance: He is well-developed. He is diaphoretic.  HENT:  Head: Normocephalic and atraumatic.     Mouth/Throat:     Pharynx: No oropharyngeal exudate.  Eyes:     General: No scleral icterus.       Right eye: No discharge.        Left eye: No discharge.     Conjunctiva/sclera: Conjunctivae normal.     Pupils: Pupils are equal, round, and reactive to light.  Neck:     Musculoskeletal: Normal range of motion and neck supple.     Thyroid: No thyromegaly.  Cardiovascular:     Rate and Rhythm: Normal rate and regular rhythm.     Heart sounds: Normal heart sounds. No murmur. No friction rub. No gallop.   Pulmonary:     Effort: Pulmonary effort is normal. No respiratory distress.     Breath sounds: Normal breath sounds. No stridor. No wheezing or rales.  Abdominal:     General: Bowel sounds are normal. There is no distension.     Palpations: Abdomen is soft.     Tenderness: There is no abdominal tenderness. There is no guarding or rebound.  Lymphadenopathy:     Cervical: No cervical adenopathy.  Skin:    General: Skin is warm.     Coloration: Skin is not  pale.     Findings: No rash.     Comments: Superficial abrasions on the left knee, no active bleeding  Neurological:     Mental Status: He is alert and oriented to person, place, and time.     Coordination: Coordination normal.     Comments: Patient repeatedly falling asleep and becoming apneic, however easily aroused by shaking his arm or leg, answering questions appropriately      ED Treatments / Results  Labs (all labs ordered are listed, but only abnormal results are displayed) Labs Reviewed  COMPREHENSIVE METABOLIC PANEL - Abnormal; Notable for the following components:      Result Value   CO2 14 (*)    Glucose, Bld 238 (*)    Creatinine, Ser 2.19 (*)    AST 391 (*)    ALT 100 (*)    GFR calc non Af Amer 31 (*)    GFR calc Af Amer 36 (*)    Anion gap 20 (*)    All other components within normal limits  CBC WITH DIFFERENTIAL/PLATELET - Abnormal; Notable for the following components:   MCV 101.7 (*)    All other components within normal limits  ETHANOL - Abnormal; Notable for the following components:   Alcohol, Ethyl (B) 70 (*)    All other components within normal limits  AMMONIA - Abnormal; Notable for the following components:   Ammonia 57 (*)    All other components within normal limits  CBG MONITORING, ED - Abnormal; Notable for the following components:   Glucose-Capillary 226 (*)    All other components within normal limits  POCT I-STAT EG7 - Abnormal; Notable for the following components:   pCO2, Ven 28.1 (*)    pO2, Ven 111.0 (*)    Bicarbonate 12.4 (*)    TCO2 13 (*)    Acid-base deficit 13.0 (*)    Potassium 3.2 (*)    Calcium, Ion 1.09 (*)    All other components within normal limits  SARS CORONAVIRUS 2 (HOSPITAL ORDER, [facility])  CK  PROTIME-INR  URINALYSIS, COMPLETE (UACMP) WITH MICROSCOPIC  RAPID URINE DRUG SCREEN, HOSP PERFORMED  LACTIC ACID, PLASMA  CBG MONITORING, ED  I-STAT VENOUS BLOOD GAS,  ED    EKG EKG  Interpretation  Date/Time:  Tuesday April 03 2019 15:53:33 EDT Ventricular Rate:  102 PR Interval:    QRS Duration: 137 QT Interval:  375 QTC Calculation: 489 R Axis:   -96 Text Interpretation:  Sinus tachycardia RBBB and LAFB Abnormal T, consider ischemia, lateral leads Baseline wander in lead(s) V6 No STEMI  Confirmed by Nanda Quinton [phone]) on 04/03/2019 4:01:44 PM   Radiology Dg Chest Port 1 View  Result Date: 04/03/2019 CLINICAL DATA:  63 year old male with mental status change found down outside his apartment. EXAM: PORTABLE CHEST 1 VIEW COMPARISON:  Prior chest x-ray 01/16/2019 FINDINGS: External defibrillator pads project over the left chest. The cardiac and mediastinal contours remain unchanged. Trace atherosclerotic calcifications can be seen in the transverse aorta. Stable background bronchitic changes and mild interstitial prominence. No edema, airspace opacity, pleural effusion or pneumothorax. Inspiratory volumes are slightly low. Osseous structures are intact and without evidence of acute injury. Chronic degenerative changes of the right shoulder. IMPRESSION: Stable chest x-ray without evidence of acute cardiopulmonary process. Electronically Signed   By: Jacqulynn Cadet M.D.   On: 04/03/2019 16:07    Procedures .Critical Care Performed by: Frederica Kuster, PA-C Authorized by: Frederica Kuster, PA-C   Critical care provider statement:    Critical care time (minutes):  45   Critical care was time spent personally by me on the following activities:  Discussions with consultants, evaluation of patient's response to treatment, examination of patient, ordering and performing treatments and interventions, ordering and review of laboratory studies, ordering and review of radiographic studies, pulse oximetry, re-evaluation of patient's condition, obtaining history from patient or surrogate and review of old charts   I assumed direction of critical care for this patient from  another provider in my specialty: no     (including critical care time)  Medications Ordered in ED Medications  sodium chloride 0.9 % bolus 1,000 mL (0 mLs Intravenous Stopped 04/03/19 1535)    And  sodium chloride 0.9 % bolus 1,000 mL (1,000 mLs Intravenous New Bag/Given 04/03/19 1535)    And  0.9 %  sodium chloride infusion (has no administration in time range)  naloxone HCl (NARCAN) 4 mg in dextrose 5 % 250 mL infusion (0 mg/hr Intravenous Stopped 04/03/19 1559)  Tdap (BOOSTRIX) injection 0.5 mL (0.5 mLs Intramuscular Not Given 04/03/19 1444)  adenosine (ADENOCARD) 6 MG/2ML injection (has no administration in time range)  naloxone [facility]) injection 1 mg (1 mg Intravenous Given 04/03/19 1427)  adenosine (ADENOCARD) 6 MG/2ML injection 6 mg (6 mg Intravenous Given 04/03/19 1543)  adenosine (ADENOCARD) 6 MG/2ML injection 12 mg (12 mg Intravenous Given 04/03/19 1548)  adenosine (ADENOCARD) 6 MG/2ML injection 18 mg (18 mg Intravenous Given 04/03/19 1551)     Initial Impression / Assessment and Plan / ED Course  I have reviewed the triage vital signs and the nursing notes.  Pertinent labs & imaging results that were available during my care of the patient were reviewed by me and considered in my medical decision making (see chart for details).        Patient presenting with altered mental status, tachycardia, hypotensive after being found down outside of his apartment.  He is diaphoretic on evaluation.  He is easily aroused, but falls asleep very quickly.  Patient was given 1 mg of Narcan and woke up very quickly, however was not falling asleep with agonal breathing pretty soon after that.  Narcan drip ordered.  Patient then became  tachycardic, morphology appears to be SVT.  Adenosine 18 mg given by Dr. Laverta Baltimore and patient converted.  CT head and C-spine are pending, as well as labs.  Tetanus booster ordered considering abrasions. At shift change, patient care transferred to Dr. Laverta Baltimore for continued  evaluation, follow up of imaging, and determination of disposition.   Final Clinical Impressions(s) / ED Diagnoses   Final diagnoses:  None    ED Discharge Orders    None         Frederica Kuster, PA-C 04/03/19 1615    Charlesetta Shanks, MD 04/25/19 1157

## 2019-04-03 NOTE — ED Triage Notes (Signed)
Pt was found outside of his apartment, unconscious. EMS arrived, pt protecting airway, but sats 85% on room air. Pt is a known alcoholic. Neighbors state that he is intoxicated. Pt becoming more awake upon arrival to ED. BP 149/69, CBG 344.

## 2019-04-03 NOTE — ED Notes (Signed)
Attempted report.  Nurse to call back when available.  Attending notified of elevated troponin of 93.  Requested that I notify cardiology.  Paged cardiology with the results.

## 2019-04-03 NOTE — ED Notes (Signed)
Stopped narcan drip per verbal order from MD. Pt alert and oriented.

## 2019-04-03 NOTE — ED Provider Notes (Signed)
Medical screening examination/treatment/procedure(s) were conducted as a shared visit with non-physician practitioner(s) and myself.  I personally evaluated the patient during the encounter.  EKG Interpretation  Date/Time:  Tuesday April 03 2019 14:08:04 EDT Ventricular Rate:  130 PR Interval:    QRS Duration: 134 QT Interval:  368 QTC Calculation: 480 R Axis:   -56 Text Interpretation:  Sinus tachycardia Supraventricular bigeminy RBBB and LAFB Abnormal T, probable ischemia, lateral leads Baseline wander in lead(s) II III aVF old  RBBB, IVCD slightly more than previousa Confirmed by Charlesetta Shanks [id number]) on 04/03/2019 2:21:40 PM  Patient found outside of his apartment unconscious with oxygen saturations in the mid 80s.  Neighbors had reported the patient was intoxicated.  Patient does admit to polysubstance use.  He reports he does not remember what happened.  Patient was very diaphoretic and somnolent on arrival.  Heart regular.  Lungs grossly clear  Patient given Narcan and awakened to normal mental status and neurologically intact.  He however had recurrence of somnolence and requires initiation of Narcan drip.       Charlesetta Shanks, MD 04/25/19 1157

## 2019-04-03 NOTE — ED Notes (Addendum)
Pt wakes to voice- answers some questions appropriately. Pt admits to smoking crack cocaine last night and drinking "lots" of beer. Small abrasion to left knee. Gauze dressing over site.

## 2019-04-03 NOTE — H&P (Signed)
History and Physical  Todd [name]. FTD:[id number] DOB: 1956/07/13 DOA: 04/03/2019  Referring physician: ER provider PCP: [name], MD  Outpatient Specialists:    Patient coming from: Home  Chief Complaint: Unresponsiveness.  HPI:  Patient is a 63 year old African-American male with past medical history significant for alcohol abuse, illicit drug use, obesity, OSA, PE, DVT, hep C, prostate cancer, hypertension, hyperlipidemia and depression.  Patient was found outside unresponsive with O2 sat of 85%.  Collateral information from the neighbors indicated that patient has been intoxicated.  On presentation to the hospital, patient was unresponsive but now awake and alert, but remains a poor historian.  Patient only told me that he was partying last night.  Lab work done on presentation was positive for cocaine, opiates and the alcohol level was 70.  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  As per the ER provider, cardiology has recommended IV Cardizem.  No fever or chills, headache, no neck pain, no chest pain, no shortness of breath, no GI symptoms or urinary symptoms.  WBC is within normal range.  Potassium is 3.2, CO2 is 14, BUN is 12 with serum creatinine of 2.19 and lactic acid of 2.1.  CT scan of the head and cervical spine have not shown any acute findings.  Chest x-ray is within normal range.  UA reveals small hemoglobin, RBC of 0-5, specific gravity of 1.010 with presence of granular and hyaline casts with rare bacteria.  Hospitalist team has been asked to admit patient for further assessment and management.  ED Course: On presentation to the ER, patient was unresponsive.  Patient was treated with IV Narcan.  Patient is non-awake and alert.  Patient has remained afebrile.  Heart rate has ranged from 62 to 186 bpm, respiratory rate of 18, blood pressure of 94-175/55-101 mmHg with O2 sat of 80% 100%.  Pertinent labs as documented above.  ER team is  consulted cardiology.  IV Cardizem has been tried for the SVTs.  Hospitalist team has been asked to admit patient.  Pertinent labs: See above.  EKG: Independently reviewed.   Imaging: independently reviewed.   Review of Systems:  Negative for fever, visual changes, sore throat, rash, new muscle aches, chest pain, SOB, dysuria, bleeding, n/v/abdominal pain.  Past Medical History:  Diagnosis Date   Arthritis    "fingers" (07/14/2018)   Depression    DVT (deep venous thrombosis) (HCC) LLE   Hepatitis C     finished harv[facility] tx ~ 2017   Hypercholesterolemia    Hypertension    Prostate cancer ([place]) 51yrs ago   Pulmonary embolism and infarction ([place]) 07/13/2018   Sleep apnea    not currently using cpap, mask causing vertigo   Type II diabetes mellitus ([place])     Past Surgical History:  Procedure Laterality Date   BIOPSY  01/12/2019   Procedure: BIOPSY;  Surgeon: Ronald Lobo, MD;  Location: [facility];  Service: Endoscopy;;   COLONOSCOPY WITH PROPOFOL N/A 02/19/2014   Procedure: COLONOSCOPY WITH PROPOFOL;  Surgeon: Garlan Fair, MD;  Location: [facility] ENDOSCOPY;  Service: Endoscopy;  Laterality: N/A;   ENDOVENOUS ABLATION SAPHENOUS VEIN W/ LASER Left 11/22/2017   endovenous laser ablation L SSV by Tinnie Gens MD    ESOPHAGOGASTRODUODENOSCOPY (EGD) WITH PROPOFOL N/A 01/12/2019   Procedure: ESOPHAGOGASTRODUODENOSCOPY (EGD) WITH PROPOFOL;  Surgeon: Ronald Lobo, MD;  Location[facility] ENDOSCOPY;  Service: Endoscopy;  Laterality: N/A;  Patient is also scheduled for barium swallow; please notify radiology  after patient's EGD is complete so that barium swallow follows the endoscopy, not vice versa   PROSTATECTOMY  2008   REPAIR QUADRICEPS / HAMSTRING MUSCLE Right      reports that he has been smoking. He has a 5.40 pack-year smoking history. He has never used smokeless tobacco. He reports current alcohol use. He reports previous drug use.  No Known Allergies  Family  History  Problem Relation Age of Onset   Cancer Father        PROSTATE     Prior to Admission medications   Medication Sig Start Date End Date Taking? Authorizing Provider  apixaban (ELIQUIS) 5 MG TABS tablet Take 5 mg by mouth 2 (two) times daily.    [provider]  atorvastatin (LIPITOR) 10 MG tablet Take 10 mg by mouth daily at 6 PM.  11/19/17   [provider]  Buprenorphine HCl-Naloxone HCl (SUBOXONE) 8-2 MG FILM Place 1 Film under the tongue 2 (two) times daily.    [provider]  DULoxetine (CYMBALTA) 60 MG capsule Take 1 capsule (60 mg total) by mouth daily. 07/05/18   Pennelope Bracken, MD  ELIQUIS STARTER PACK (ELIQUIS STARTER PACK) 5 MG TABS Take as directed on package: start with two-5mg  tablets twice daily for 7 days. On day 8, switch to one-5mg  tablet twice daily. Patient not taking: Reported on 04/03/2019 07/14/18   Patrecia Pour, MD  HYDROcodone-acetaminophen (NORCO/VICODIN) 5-325 MG tablet Take 2 tablets by mouth every 4 (four) hours as needed for severe pain. 09/08/18   Blanchie Dessert, MD  hydrOXYzine (ATARAX/VISTARIL) 50 MG tablet Take 1 tablet (50 mg total) by mouth every 6 (six) hours as needed for anxiety. 07/04/18   Pennelope Bracken, MD  lidocaine (LIDODERM) 5 % Place 1 patch onto the skin daily. Remove & Discard patch within 12 hours or as directed by MD 09/08/18   Blanchie Dessert, MD  losartan (COZAAR) 25 MG tablet Take 25 mg by mouth daily.    [provider]  metFORMIN (GLUCOPHAGE) 1000 MG tablet Take 1,000 mg by mouth 2 (two) times daily with a meal.    [provider]  Multiple Vitamins-Minerals (CENTRUM SILVER 50+MEN) TABS Take 1 tablet by mouth daily with breakfast.    [provider]  pantoprazole (PROTONIX) 40 MG tablet Take 40 mg by mouth 2 (two) times daily.    [provider]  sertraline (ZOLOFT) 100 MG tablet Take 100 mg by mouth every morning.    [provider]    tamsulosin (FLOMAX) 0.4 MG CAPS capsule Take 0.4 mg by mouth every evening.    [provider]  traMADol (ULTRAM) 50 MG tablet Take 2 tablets (100 mg total) by mouth every 6 (six) hours as needed for moderate pain. 07/14/18   Patrecia Pour, MD  traZODone (DESYREL) 100 MG tablet Take 1 tablet (100 mg total) by mouth at bedtime. 07/04/18   Pennelope Bracken, MD  venlafaxine (EFFEXOR) 100 MG tablet Take 100 mg by mouth 2 (two) times daily.    [provider]    Physical Exam: Vitals:   04/03/19 1953 04/03/19 2115 04/03/19 2130 04/03/19 2143  BP: (!) 119/92  111/85   Pulse: (!) 146 (!) 144    Resp:  19 18   Temp:    98.8 F (37.1 C)  TempSrc:    Oral  SpO2:  94%      Constitutional:   Appears calm and comfortable.  Patient is obese. Eyes:  No pallor. No jaundice.  ENMT:   external ears, nose appear normal.  Dry buccal mucosa.         Neck:   Neck is supple. No JVD Respiratory:   CTA bilaterally, no w/r/r.   Respiratory effort normal. No retractions or accessory muscle use Cardiovascular:   S1S2, tachycardic  No LE extremity edema   Abdomen:   Abdomen is obese, soft and non tender. Organs are difficult to assess. Neurologic:   Awake and alert.  Moves all limbs.  Wt Readings from Last 3 Encounters:  01/12/19 103.3 kg  09/08/18 105.2 kg  08/14/18 104.3 kg    I have personally reviewed following labs and imaging studies  Labs on Admission:  CBC: Recent Labs  Lab 04/03/19 1422 04/03/19 1526  WBC 5.3  --   NEUTROABS 2.4  --   HGB 15.5 15.6  HCT 48.8 46.0  MCV 101.7*  --   PLT 224  --    Basic Metabolic Panel: Recent Labs  Lab 04/03/19 1422 04/03/19 1526  NA 140 142  K 3.9 3.2*  CL 106  --   CO2 14*  --   GLUCOSE 238*  --   BUN 12  --   CREATININE 2.19*  --   CALCIUM 9.2  --    Liver Function Tests: Recent Labs  Lab 04/03/19 1422  AST 391*  ALT 100*  ALKPHOS 89  BILITOT 0.8  PROT 7.8  ALBUMIN 4.2   No  results for input(s): LIPASE, AMYLASE in the last 168 hours. Recent Labs  Lab 04/03/19 1519  AMMONIA 57*   Coagulation Profile: Recent Labs  Lab 04/03/19 1519  INR 1.2   Cardiac Enzymes: Recent Labs  Lab 04/03/19 1422  CKTOTAL 141   BNP (last 3 results) No results for input(s): PROBNP in the last 8760 hours. HbA1C: No results for input(s): HGBA1C in the last 72 hours. CBG: Recent Labs  Lab 04/03/19 1413  GLUCAP 226*   Lipid Profile: No results for input(s): CHOL, HDL, LDLCALC, TRIG, CHOLHDL, LDLDIRECT in the last 72 hours. Thyroid Function Tests: No results for input(s): TSH, T4TOTAL, FREET4, T3FREE, THYROIDAB in the last 72 hours. Anemia Panel: No results for input(s): VITAMINB12, FOLATE, FERRITIN, TIBC, IRON, RETICCTPCT in the last 72 hours. Urine analysis:    Component Value Date/Time   COLORURINE YELLOW 04/03/2019 1722   APPEARANCEUR CLOUDY (A) 04/03/2019 1722   LABSPEC 1.010 04/03/2019 1722   PHURINE 6.0 04/03/2019 1722   GLUCOSEU 50 (A) 04/03/2019 1722   HGBUR SMALL (A) 04/03/2019 1722   BILIRUBINUR NEGATIVE 04/03/2019 1722   KETONESUR NEGATIVE 04/03/2019 1722   PROTEINUR 100 (A) 04/03/2019 1722   UROBILINOGEN 0.2 12/24/2009 1944   NITRITE NEGATIVE 04/03/2019 1722   LEUKOCYTESUR NEGATIVE 04/03/2019 1722   Sepsis Labs: @LABRCNTIP (procalcitonin:4,lacticidven:4) ) Recent Results (from the past 240 hour(s))  SARS Coronavirus 2 [facility] order, Performed in [facility] hospital lab) Nasopharyngeal Nasopharyngeal Swab     Status: None   Collection Time: 04/03/19  2:32 PM   Specimen: Nasopharyngeal Swab  Result Value Ref Range Status   SARS Coronavirus 2 NEGATIVE NEGATIVE Final    Comment: (NOTE) If result is NEGATIVE SARS-CoV-2 target nucleic acids are NOT DETECTED. The SARS-CoV-2 RNA is generally detectable in upper and lower  respiratory specimens during the acute phase of infection. The lowest  concentration of SARS-CoV-2 viral copies this assay can  detect is 250  copies / mL. A negative result does not preclude SARS-CoV-2 infection  and should  not be used as the sole basis for treatment or other  patient management decisions.  A negative result may occur with  improper specimen collection / handling, submission of specimen other  than nasopharyngeal swab, presence of viral mutation(s) within the  areas targeted by this assay, and inadequate number of viral copies  (<250 copies / mL). A negative result must be combined with clinical  observations, patient history, and epidemiological information. If result is POSITIVE SARS-CoV-2 target nucleic acids are DETECTED. The SARS-CoV-2 RNA is generally detectable in upper and lower  respiratory specimens dur ing the acute phase of infection.  Positive  results are indicative of active infection with SARS-CoV-2.  Clinical  correlation with patient history and other diagnostic information is  necessary to determine patient infection status.  Positive results do  not rule out bacterial infection or co-infection with other viruses. If result is PRESUMPTIVE POSTIVE SARS-CoV-2 nucleic acids MAY BE PRESENT.   A presumptive positive result was obtained on the submitted specimen  and confirmed on repeat testing.  While 2019 novel coronavirus  (SARS-CoV-2) nucleic acids may be present in the submitted sample  additional confirmatory testing may be necessary for epidemiological  and / or clinical management purposes  to differentiate between  SARS-CoV-2 and other Sarbecovirus currently known to infect humans.  If clinically indicated additional testing with an alternate test  methodology [phone]) is advised. The SARS-CoV-2 RNA is generally  detectable in upper and lower respiratory sp ecimens during the acute  phase of infection. The expected result is Negative. Fact Sheet for Patients:  StrictlyIdeas.no Fact Sheet for Healthcare  Providers: BankingDealers.co.za This test is not yet approved or cleared by the [place] FDA and has been authorized for detection and/or diagnosis of SARS-CoV-2 by FDA under an Emergency Use Authorization (EUA).  This EUA will remain in effect (meaning this test can be used) for the duration of the COVID-19 declaration under Section 564(b)(1) of the Act, 21 U.S.C. section 360bbb-3(b)(1), unless the authorization is terminated or revoked sooner. Performed at [facility], [place] [address], [place], [place] [address]       Radiological Exams on Admission: Ct Head Wo Contrast  Result Date: 04/03/2019 CLINICAL DATA:  Found on ground.  Minor head trauma.  ETOH. EXAM: CT HEAD WITHOUT CONTRAST CT CERVICAL SPINE WITHOUT CONTRAST TECHNIQUE: Multidetector CT imaging of the head and cervical spine was performed following the standard protocol without intravenous contrast. Multiplanar CT image reconstructions of the cervical spine were also generated. COMPARISON:  CT head 04/13/2018 FINDINGS: CT HEAD FINDINGS Brain: Generalized atrophy. Patchy white matter hypodensity bilaterally similar to the prior study. Negative for acute infarct, hemorrhage, mass. Vascular: Negative for hyperdense vessel Skull: Negative Sinuses/Orbits: Chronic fracture left medial orbit. Mild mucosal edema paranasal sinuses. No orbital lesion. Other: None CT CERVICAL SPINE FINDINGS Alignment: Normal alignment.  Moderate cervical kyphosis. Skull base and vertebrae: Negative for fracture Soft tissues and spinal canal: Negative Disc levels: Disc degeneration and spurring most prominent C5-6 and C6-7 causing mild foraminal narrowing bilaterally. Upper chest: Negative Other: None IMPRESSION: 1. No acute intracranial abnormality. Atrophy and white matter changes consistent with chronic microvascular ischemia 2. Negative for cervical spine fracture. Cervical spondylosis and kyphosis. Electronically Signed   By:  Franchot Gallo M.D.   On: 04/03/2019 18:29   Ct Cervical Spine Wo Contrast  Result Date: 04/03/2019 CLINICAL DATA:  Found on ground.  Minor head trauma.  ETOH. EXAM: CT HEAD WITHOUT CONTRAST CT CERVICAL SPINE WITHOUT CONTRAST  TECHNIQUE: Multidetector CT imaging of the head and cervical spine was performed following the standard protocol without intravenous contrast. Multiplanar CT image reconstructions of the cervical spine were also generated. COMPARISON:  CT head 04/13/2018 FINDINGS: CT HEAD FINDINGS Brain: Generalized atrophy. Patchy white matter hypodensity bilaterally similar to the prior study. Negative for acute infarct, hemorrhage, mass. Vascular: Negative for hyperdense vessel Skull: Negative Sinuses/Orbits: Chronic fracture left medial orbit. Mild mucosal edema paranasal sinuses. No orbital lesion. Other: None CT CERVICAL SPINE FINDINGS Alignment: Normal alignment.  Moderate cervical kyphosis. Skull base and vertebrae: Negative for fracture Soft tissues and spinal canal: Negative Disc levels: Disc degeneration and spurring most prominent C5-6 and C6-7 causing mild foraminal narrowing bilaterally. Upper chest: Negative Other: None IMPRESSION: 1. No acute intracranial abnormality. Atrophy and white matter changes consistent with chronic microvascular ischemia 2. Negative for cervical spine fracture. Cervical spondylosis and kyphosis. Electronically Signed   By: Franchot Gallo M.D.   On: 04/03/2019 18:29   Dg Chest Port 1 View  Result Date: 04/03/2019 CLINICAL DATA:  63 year old male with mental status change found down outside his apartment. EXAM: PORTABLE CHEST 1 VIEW COMPARISON:  Prior chest x-ray 01/16/2019 FINDINGS: External defibrillator pads project over the left chest. The cardiac and mediastinal contours remain unchanged. Trace atherosclerotic calcifications can be seen in the transverse aorta. Stable background bronchitic changes and mild interstitial prominence. No edema, airspace opacity,  pleural effusion or pneumothorax. Inspiratory volumes are slightly low. Osseous structures are intact and without evidence of acute injury. Chronic degenerative changes of the right shoulder. IMPRESSION: Stable chest x-ray without evidence of acute cardiopulmonary process. Electronically Signed   By: Jacqulynn Cadet M.D.   On: 04/03/2019 16:07    EKG: Independently reviewed.    Active Problems:   AMS (altered mental status)   Assessment/Plan Altered mental status/unconsciousness: Likely secondary to alcohol use and illicit substance use. Admit patient to progressive unit.  Patient is now awake and alert We will start patient on CIWA protocol Cardiology team is managing SVT. Continue volume resuscitation Correct acidosis Further management depend on hospital course  Alcohol abuse, cannot rule out withdrawal: CIWA protocol.  Illicit substance abuse: Patient's toxicology was positive for opiates and cocaine. Use benzodiazepines.  Supraventricular tachycardia: Cardiology is managing. Likely multifactorial, and related to above.  Anion gap metabolic acidosis: Lactic acid is elevated CO2 is 14 VBG is noted Recheck Renal panel stat  Volume depletion: Hydrate patient.  Acute kidney injury on CKD III: Suspect ATN versus prerenal UA is noted Check urine sodium UPC Check C3/C4 And ANA Renal ultrasound Further management will depend on hospital course  DVT prophylaxis: Subacute heparin Code Status: Full code Family Communication:  Disposition Plan: Will depend on hospital course Consults called: Cardiology has been consulted Admission status: Inpatient  Time spent: 65 minutes  Dana Allan, MD  [facility] Pager #: [phone] 7PM-7AM contact night coverage as above  04/03/2019, 9:57 PM

## 2019-04-03 NOTE — ED Notes (Signed)
Pt converted to SR- ST HR 80-102. MD at bedside. Repeated EKG

## 2019-04-03 NOTE — ED Notes (Signed)
Tech attempted to draw labs. Tech was unsuccessful.

## 2019-04-03 NOTE — ED Provider Notes (Signed)
Blood pressure (!) 147/103, pulse 99, temperature 97.8 F (36.6 C), temperature source Oral, resp. rate (!) 24, SpO2 100 %.  Assuming care from [facility] PA-C and Dr. Johnney Killian.  In short, Todd [name]. is a 63 y.o. male with a chief complaint of Altered Mental Status .  Refer to the original H&P for additional details.  The current plan of care is to f/u on labs and address his acute rhythm change at sign out. Patient now in SVT with rate ~ 175 bpm. No hypotension. Awake and alert. Discussed risk/benefit with patient prior to procedure.   .Cardioversion  Date/Time: 04/03/2019 3:57 PM Performed by: Margette Fast, MD Authorized by: Margette Fast, MD   Consent:    Consent obtained:  Verbal   Consent given by:  Patient   Risks discussed:  Death and induced arrhythmia   Alternatives discussed:  No treatment and rate-control medication Pre-procedure details:    Cardioversion basis:  Emergent   Rhythm:  Supraventricular tachycardia   Electrode placement:  Anterior-posterior Patient sedated: No Attempt one:    Cardioversion mode attempt one: 6 mg adenosine    Shock outcome:  No change in rhythm Attempt two:    Cardioversion mode attempt two: 12 mg adenosine    Shock outcome:  No change in rhythm Attempt three:    Cardioversion mode attempt three: 18 mg adenosine    Shock outcome:  Conversion to normal sinus rhythm Post-procedure details:    Patient status:  Awake   Patient tolerance of procedure:  Tolerated well, no immediate complications .Critical Care Performed by: Margette Fast, MD Authorized by: Margette Fast, MD   Critical care provider statement:    Critical care time (minutes):  45   Critical care was necessary to treat or prevent imminent or life-threatening deterioration of the following conditions:  Circulatory failure and CNS failure or compromise   Critical care was time spent personally by me on the following activities:  Evaluation of patient's response to  treatment, examination of patient, ordering and performing treatments and interventions, ordering and review of laboratory studies, ordering and review of radiographic studies, pulse oximetry, re-evaluation of patient's condition, obtaining history from patient or surrogate and review of old charts   I assumed direction of critical care for this patient from another provider in my specialty: no      04:30 PM  Patient now back in SVT. With recent cocaine use will discuss with Cardiology regarding mgmt from this point. Remains awake and alert with normal BP. Spoke with Dr. Stanford Breed regarding the case by phone. Will start Diltiazem.  Discussed patient's case with [facility] to request admission. Patient and family (if present) updated with plan. Care transferred to [facility] service.  I reviewed all nursing notes, vitals, pertinent old records, EKGs, labs, imaging (as available).    07:48 PM Spoke with Dr. Marlou Porch regarding the continued tachycardia.  He reviewed EKGs.  Patient could get additional IV bolus of diltiazem if rate control remains an issue.  Could also consider p.o. carvedilol 12.5 mg.      Margette Fast, MD 04/03/19 [phone]

## 2019-04-03 NOTE — ED Notes (Signed)
Pt went back into SVT- notified MD. Pt alert and oriented. Denies CP/SOB

## 2019-04-03 NOTE — Progress Notes (Signed)
Pt admitted from ED with the diagnosis of AMS, Pt alert and oriented c/o of slight back pain and requesting for something to drink, settled in bed with call light at bedside, Cardizem drip infusing at 76ml/hr HR still in the 140s, tele monitor put and verified on pt, admit [facility] paged for diet orders, was however reassured and will continue to monitor. [place]-Asidi, Haruto Demaria Efe

## 2019-04-03 NOTE — ED Notes (Signed)
ED TO INPATIENT HANDOFF REPORT  ED Nurse Name and Phone #: [phone]  S Name/Age/Gender Todd [name]. 63 y.o. male Room/Bed: RESUSC/RESUSC  Code Status   Code Status: Prior  Home/SNF/Other Home Patient oriented to: self, place, time and situation Is this baseline? Yes   Triage Complete: Triage complete  Chief Complaint ETOH  Triage Note Pt was found outside of his apartment, unconscious. EMS arrived, pt protecting airway, but sats 85% on room air. Pt is a known alcoholic. Neighbors state that he is intoxicated. Pt becoming more awake upon arrival to ED. BP 149/69, CBG 344.   Allergies No Known Allergies  Level of Care/Admitting Diagnosis ED Disposition    ED Disposition Condition [facility] Area: [place] [[id number]]  Level of Care: Progressive [102]  Covid Evaluation: Asymptomatic Screening Protocol (No Symptoms)  Diagnosis: AMS (altered mental status) [[id number]]  Admitting Physician: Shirlean Mylar  Attending Physician: Dana Allan I [[id number]]  Estimated length of stay: past midnight tomorrow  Certification:: I certify this patient will need inpatient services for at least 2 midnights  PT Class (Do Not Modify): Inpatient [101]  PT Acc Code (Do Not Modify): Private [1]       B Medical/Surgery History Past Medical History:  Diagnosis Date  . Arthritis    "fingers" (07/14/2018)  . Depression   . DVT (deep venous thrombosis) (HCC) LLE  . Hepatitis C     finished harvoni tx ~ 2017  . Hypercholesterolemia   . Hypertension   . Prostate cancer ([place]) 58yrs ago  . Pulmonary embolism and infarction ([place]) 07/13/2018  . Sleep apnea    not currently using cpap, mask causing vertigo  . Type II diabetes mellitus ([place])    Past Surgical History:  Procedure Laterality Date  . BIOPSY  01/12/2019   Procedure: BIOPSY;  Surgeon: Ronald Lobo, MD;  Location: [facility];  Service: Endoscopy;;  . COLONOSCOPY WITH  PROPOFOL N/A 02/19/2014   Procedure: COLONOSCOPY WITH PROPOFOL;  Surgeon: Garlan Fair, MD;  Location: [facility] ENDOSCOPY;  Service: Endoscopy;  Laterality: N/A;  . ENDOVENOUS ABLATION SAPHENOUS VEIN W/ LASER Left 11/22/2017   endovenous laser ablation L SSV by Tinnie Gens MD   . ESOPHAGOGASTRODUODENOSCOPY (EGD) WITH PROPOFOL N/A 01/12/2019   Procedure: ESOPHAGOGASTRODUODENOSCOPY (EGD) WITH PROPOFOL;  Surgeon: Ronald Lobo, MD;  Location: [facility] ENDOSCOPY;  Service: Endoscopy;  Laterality: N/A;  Patient is also scheduled for barium swallow; please notify radiology after patient's EGD is complete so that barium swallow follows the endoscopy, not vice versa  . PROSTATECTOMY  2008  . REPAIR QUADRICEPS / HAMSTRING MUSCLE Right      A IV Location/Drains/Wounds Patient Lines/Drains/Airways Status   Active Line/Drains/Airways    Name:   Placement date:   Placement time:   Site:   Days:   Peripheral IV 04/03/19 Right Forearm   04/03/19    1428    Forearm   less than 1          Intake/Output Last 24 hours  Intake/Output Summary (Last 24 hours) at 04/03/2019 2118 Last data filed at 04/03/2019 1647 Gross per 24 hour  Intake 2000 ml  Output -  Net 2000 ml    Labs/Imaging Results for orders placed or performed during the hospital encounter of 04/03/19 (from the past 48 hour(s))  CBG monitoring, ED     Status: Abnormal   Collection Time: 04/03/19  2:13 PM  Result Value Ref Range   Glucose-Capillary 226 (  H) 70 - 99 mg/dL   Comment 1 Notify RN    Comment 2 Document in Chart   Comprehensive metabolic panel     Status: Abnormal   Collection Time: 04/03/19  2:22 PM  Result Value Ref Range   Sodium 140 135 - 145 mmol/L   Potassium 3.9 3.5 - 5.1 mmol/L   Chloride 106 98 - 111 mmol/L   CO2 14 (L) 22 - 32 mmol/L   Glucose, Bld 238 (H) 70 - 99 mg/dL   BUN 12 8 - 23 mg/dL   Creatinine, Ser 2.19 (H) 0.61 - 1.24 mg/dL   Calcium 9.2 8.9 - 10.3 mg/dL   Total Protein 7.8 6.5 - 8.1 g/dL   Albumin 4.2  3.5 - 5.0 g/dL   AST 391 (H) 15 - 41 U/L   ALT 100 (H) 0 - 44 U/L   Alkaline Phosphatase 89 38 - 126 U/L   Total Bilirubin 0.8 0.3 - 1.2 mg/dL   GFR calc non Af Amer 31 (L) >60 mL/min   GFR calc Af Amer 36 (L) >60 mL/min   Anion gap 20 (H) 5 - 15    Comment: Performed at [facility], [address], [place], [place] [address]  CBC WITH DIFFERENTIAL     Status: Abnormal   Collection Time: 04/03/19  2:22 PM  Result Value Ref Range   WBC 5.3 4.0 - 10.5 K/uL   RBC 4.80 4.22 - 5.81 MIL/uL   Hemoglobin 15.5 13.0 - 17.0 g/dL   HCT 48.8 39.0 - 52.0 %   MCV 101.7 (H) 80.0 - 100.0 fL   MCH 32.3 26.0 - 34.0 pg   MCHC 31.8 30.0 - 36.0 g/dL   RDW 13.1 11.5 - 15.5 %   Platelets 224 150 - 400 K/uL   nRBC 0.0 0.0 - 0.2 %   Neutrophils Relative % 46 %   Neutro Abs 2.4 1.7 - 7.7 K/uL   Lymphocytes Relative 47 %   Lymphs Abs 2.5 0.7 - 4.0 K/uL   Monocytes Relative 6 %   Monocytes Absolute 0.3 0.1 - 1.0 K/uL   Eosinophils Relative 1 %   Eosinophils Absolute 0.0 0.0 - 0.5 K/uL   Basophils Relative 0 %   Basophils Absolute 0.0 0.0 - 0.1 K/uL   Immature Granulocytes 0 %   Abs Immature Granulocytes 0.01 0.00 - 0.07 K/uL    Comment: Performed at [facility] [address], [place], [place] [address]  CK     Status: None   Collection Time: 04/03/19  2:22 PM  Result Value Ref Range   Total CK 141 49 - 397 U/L    Comment: Performed at [facility], [place] [address] [place],  [address]  Ethanol     Status: Abnormal   Collection Time: 04/03/19  2:32 PM  Result Value Ref Range   Alcohol, Ethyl (B) 70 (H) <10 mg/dL    Comment: (NOTE) Lowest detectable limit for serum alcohol is 10 mg/dL. For medical purposes only. Performed at [facility], [place] [address] [place],  [address]   SARS Coronavirus 2 [facility] order, Performed in [facility] hospital lab) Nasopharyngeal Nasopharyngeal Swab     Status: None   Collection Time: 04/03/19  2:32 PM   Specimen:  Nasopharyngeal Swab  Result Value Ref Range   SARS Coronavirus 2 NEGATIVE NEGATIVE    Comment: (NOTE) If result is NEGATIVE SARS-CoV-2 target nucleic acids are NOT DETECTED. The SARS-CoV-2  RNA is generally detectable in upper and lower  respiratory specimens during the acute phase of infection. The lowest  concentration of SARS-CoV-2 viral copies this assay can detect is 250  copies / mL. A negative result does not preclude SARS-CoV-2 infection  and should not be used as the sole basis for treatment or other  patient management decisions.  A negative result may occur with  improper specimen collection / handling, submission of specimen other  than nasopharyngeal swab, presence of viral mutation(s) within the  areas targeted by this assay, and inadequate number of viral copies  (<250 copies / mL). A negative result must be combined with clinical  observations, patient history, and epidemiological information. If result is POSITIVE SARS-CoV-2 target nucleic acids are DETECTED. The SARS-CoV-2 RNA is generally detectable in upper and lower  respiratory specimens dur ing the acute phase of infection.  Positive  results are indicative of active infection with SARS-CoV-2.  Clinical  correlation with patient history and other diagnostic information is  necessary to determine patient infection status.  Positive results do  not rule out bacterial infection or co-infection with other viruses. If result is PRESUMPTIVE POSTIVE SARS-CoV-2 nucleic acids MAY BE PRESENT.   A presumptive positive result was obtained on the submitted specimen  and confirmed on repeat testing.  While 2019 novel coronavirus  (SARS-CoV-2) nucleic acids may be present in the submitted sample  additional confirmatory testing may be necessary for epidemiological  and / or clinical management purposes  to differentiate between  SARS-CoV-2 and other Sarbecovirus currently known to infect humans.  If clinically indicated  additional testing with an alternate test  methodology [phone]) is advised. The SARS-CoV-2 RNA is generally  detectable in upper and lower respiratory sp ecimens during the acute  phase of infection. The expected result is Negative. Fact Sheet for Patients:  StrictlyIdeas.no Fact Sheet for Healthcare Providers: BankingDealers.co.za This test is not yet approved or cleared by the [place] FDA and has been authorized for detection and/or diagnosis of SARS-CoV-2 by FDA under an Emergency Use Authorization (EUA).  This EUA will remain in effect (meaning this test can be used) for the duration of the COVID-19 declaration under Section 564(b)(1) of the Act, 21 U.S.C. section 360bbb-3(b)(1), unless the authorization is terminated or revoked sooner. Performed at [facility], [place] [address] [place], [place] [address]   Ammonia     Status: Abnormal   Collection Time: 04/03/19  3:19 PM  Result Value Ref Range   Ammonia 57 (H) 9 - 35 umol/L    Comment: Performed at [facility], [place] [address] [place], [place] [address]  Lactic acid, plasma     Status: Abnormal   Collection Time: 04/03/19  3:19 PM  Result Value Ref Range   Lactic Acid, Venous 4.2 (HH) 0.5 - 1.9 mmol/L    Comment: CRITICAL RESULT CALLED TO, READ BACK BY AND VERIFIED WITH: H DOOLEY,RN 1614 04/03/2019 WBOND Performed at [facility], [place] [address] [place], [place] [address]   Protime-INR     Status: None   Collection Time: 04/03/19  3:19 PM  Result Value Ref Range   Prothrombin Time 14.6 11.4 - 15.2 seconds   INR 1.2 0.8 - 1.2    Comment: (NOTE) INR goal varies based on device and disease states. Performed at [facility], [place] [address] [place], [place] [address]   POCT I-Stat EG7     Status: Abnormal   Collection Time:  04/03/19  3:26 PM  Result Value Ref Range   pH, Ven 7.252 7.250 - 7.430   pCO2, Ven 28.1 (L) 44.0 - 60.0 mmHg   pO2,  Ven 111.0 (H) 32.0 - 45.0 mmHg   Bicarbonate 12.4 (L) 20.0 - 28.0 mmol/L   TCO2 13 (L) 22 - 32 mmol/L   O2 Saturation 98.0 %   Acid-base deficit 13.0 (H) 0.0 - 2.0 mmol/L   Sodium 142 135 - 145 mmol/L   Potassium 3.2 (L) 3.5 - 5.1 mmol/L   Calcium, Ion 1.09 (L) 1.15 - 1.40 mmol/L   HCT 46.0 39.0 - 52.0 %   Hemoglobin 15.6 13.0 - 17.0 g/dL   Patient temperature HIDE    Sample type VENOUS   Urinalysis, Complete w Microscopic     Status: Abnormal   Collection Time: 04/03/19  5:22 PM  Result Value Ref Range   Color, Urine YELLOW YELLOW   APPearance CLOUDY (A) CLEAR   Specific Gravity, Urine 1.010 1.005 - 1.030   pH 6.0 5.0 - 8.0   Glucose, UA 50 (A) NEGATIVE mg/dL   Hgb urine dipstick SMALL (A) NEGATIVE   Bilirubin Urine NEGATIVE NEGATIVE   Ketones, ur NEGATIVE NEGATIVE mg/dL   Protein, ur 100 (A) NEGATIVE mg/dL   Nitrite NEGATIVE NEGATIVE   Leukocytes,Ua NEGATIVE NEGATIVE   RBC / HPF 0-5 0 - 5 RBC/hpf   WBC, UA 0-5 0 - 5 WBC/hpf   Bacteria, UA RARE (A) NONE SEEN   Squamous Epithelial / LPF 0-5 0 - 5   Mucus PRESENT    Hyaline Casts, UA PRESENT    Granular Casts, UA PRESENT     Comment: Performed at [facility], [address], [place], [place] [address]  Urine rapid drug screen (hosp performed)     Status: Abnormal   Collection Time: 04/03/19  5:22 PM  Result Value Ref Range   Opiates POSITIVE (A) NONE DETECTED   Cocaine POSITIVE (A) NONE DETECTED   Benzodiazepines NONE DETECTED NONE DETECTED   Amphetamines NONE DETECTED NONE DETECTED   Tetrahydrocannabinol NONE DETECTED NONE DETECTED   Barbiturates NONE DETECTED NONE DETECTED    Comment: (NOTE) DRUG SCREEN FOR MEDICAL PURPOSES ONLY.  IF CONFIRMATION IS NEEDED FOR ANY PURPOSE, NOTIFY LAB WITHIN 5 DAYS. LOWEST DETECTABLE LIMITS FOR URINE DRUG SCREEN Drug Class                     Cutoff (ng/mL) Amphetamine and metabolites    1000 Barbiturate and metabolites    200 Benzodiazepine                 299 Tricyclics  and metabolites     300 Opiates and metabolites        300 Cocaine and metabolites        300 THC                            50 Performed at [facility], [place] [address], [place], [place] [address]   Troponin I (High Sensitivity)     Status: Abnormal   Collection Time: 04/03/19  7:44 PM  Result Value Ref Range   Troponin I (High Sensitivity) 93 (H) <18 ng/L    Comment: (NOTE) Elevated high sensitivity troponin I (hsTnI) values and significant  changes across serial measurements may suggest ACS but many other  chronic and acute conditions are known to elevate hsTnI results.  Refer  to the "Links" section for chest pain algorithms and additional  guidance. Performed at [facility], [place] [address] [place], [place] [address]   Lactic acid, plasma     Status: Abnormal   Collection Time: 04/03/19  7:51 PM  Result Value Ref Range   Lactic Acid, Venous 2.1 (HH) 0.5 - 1.9 mmol/L    Comment: CRITICAL RESULT CALLED TO, READ BACK BY AND VERIFIED WITH: Donata Clay 2033 04/03/2019 WBOND Performed at [facility], [place] [address], [place], [place] [address]    Ct Head Wo Contrast  Result Date: 04/03/2019 CLINICAL DATA:  Found on ground.  Minor head trauma.  ETOH. EXAM: CT HEAD WITHOUT CONTRAST CT CERVICAL SPINE WITHOUT CONTRAST TECHNIQUE: Multidetector CT imaging of the head and cervical spine was performed following the standard protocol without intravenous contrast. Multiplanar CT image reconstructions of the cervical spine were also generated. COMPARISON:  CT head 04/13/2018 FINDINGS: CT HEAD FINDINGS Brain: Generalized atrophy. Patchy white matter hypodensity bilaterally similar to the prior study. Negative for acute infarct, hemorrhage, mass. Vascular: Negative for hyperdense vessel Skull: Negative Sinuses/Orbits: Chronic fracture left medial orbit. Mild mucosal edema paranasal sinuses. No orbital lesion. Other: None CT CERVICAL SPINE FINDINGS Alignment: Normal alignment.   Moderate cervical kyphosis. Skull base and vertebrae: Negative for fracture Soft tissues and spinal canal: Negative Disc levels: Disc degeneration and spurring most prominent C5-6 and C6-7 causing mild foraminal narrowing bilaterally. Upper chest: Negative Other: None IMPRESSION: 1. No acute intracranial abnormality. Atrophy and white matter changes consistent with chronic microvascular ischemia 2. Negative for cervical spine fracture. Cervical spondylosis and kyphosis. Electronically Signed   By: Franchot Gallo M.D.   On: 04/03/2019 18:29   Ct Cervical Spine Wo Contrast  Result Date: 04/03/2019 CLINICAL DATA:  Found on ground.  Minor head trauma.  ETOH. EXAM: CT HEAD WITHOUT CONTRAST CT CERVICAL SPINE WITHOUT CONTRAST TECHNIQUE: Multidetector CT imaging of the head and cervical spine was performed following the standard protocol without intravenous contrast. Multiplanar CT image reconstructions of the cervical spine were also generated. COMPARISON:  CT head 04/13/2018 FINDINGS: CT HEAD FINDINGS Brain: Generalized atrophy. Patchy white matter hypodensity bilaterally similar to the prior study. Negative for acute infarct, hemorrhage, mass. Vascular: Negative for hyperdense vessel Skull: Negative Sinuses/Orbits: Chronic fracture left medial orbit. Mild mucosal edema paranasal sinuses. No orbital lesion. Other: None CT CERVICAL SPINE FINDINGS Alignment: Normal alignment.  Moderate cervical kyphosis. Skull base and vertebrae: Negative for fracture Soft tissues and spinal canal: Negative Disc levels: Disc degeneration and spurring most prominent C5-6 and C6-7 causing mild foraminal narrowing bilaterally. Upper chest: Negative Other: None IMPRESSION: 1. No acute intracranial abnormality. Atrophy and white matter changes consistent with chronic microvascular ischemia 2. Negative for cervical spine fracture. Cervical spondylosis and kyphosis. Electronically Signed   By: Franchot Gallo M.D.   On: 04/03/2019 18:29   Dg  Chest Port 1 View  Result Date: 04/03/2019 CLINICAL DATA:  63 year old male with mental status change found down outside his apartment. EXAM: PORTABLE CHEST 1 VIEW COMPARISON:  Prior chest x-ray 01/16/2019 FINDINGS: External defibrillator pads project over the left chest. The cardiac and mediastinal contours remain unchanged. Trace atherosclerotic calcifications can be seen in the transverse aorta. Stable background bronchitic changes and mild interstitial prominence. No edema, airspace opacity, pleural effusion or pneumothorax. Inspiratory volumes are slightly low. Osseous structures are intact and without evidence of acute injury. Chronic degenerative changes of the right shoulder. IMPRESSION: Stable chest x-ray without evidence of acute  cardiopulmonary process. Electronically Signed   By: Jacqulynn Cadet M.D.   On: 04/03/2019 16:07    Pending Labs Unresulted Labs (From admission, onward)    Start     Ordered   04/03/19 1837  Lactic acid, plasma  Now then every 2 hours,   STAT     04/03/19 1836          Vitals/Pain Today's Vitals   04/03/19 1800 04/03/19 1815 04/03/19 1818 04/03/19 1953  BP: (!) 140/115 (!) 137/100 (!) 137/100 (!) 119/92  Pulse:  (!) 147  (!) 146  Resp:  20    Temp:      TempSrc:      SpO2:  95%      Isolation Precautions No active isolations  Medications Medications  sodium chloride 0.9 % bolus 1,000 mL (0 mLs Intravenous Stopped 04/03/19 1535)    And  sodium chloride 0.9 % bolus 1,000 mL (0 mLs Intravenous Stopped 04/03/19 1647)    And  0.9 %  sodium chloride infusion ( Intravenous New Bag/Given 04/03/19 1648)  naloxone HCl (NARCAN) 4 mg in dextrose 5 % 250 mL infusion (0 mg/hr Intravenous Stopped 04/03/19 1559)  Tdap (BOOSTRIX) injection 0.5 mL (0.5 mLs Intramuscular Not Given 04/03/19 1444)  adenosine (ADENOCARD) 6 MG/2ML injection (has no administration in time range)  diltiazem (CARDIZEM) 1 mg/mL load via infusion 15 mg (15 mg Intravenous Bolus from Bag  04/03/19 1652)    And  diltiazem (CARDIZEM) 100 mg in dextrose 5% 148mL (1 mg/mL) infusion (15 mg/hr Intravenous Rate/Dose Change 04/03/19 1936)  naloxone (NARCAN) injection 1 mg (1 mg Intravenous Given 04/03/19 1427)  adenosine (ADENOCARD) 6 MG/2ML injection 6 mg (6 mg Intravenous Given 04/03/19 1543)  adenosine (ADENOCARD) 6 MG/2ML injection 12 mg (12 mg Intravenous Given 04/03/19 1548)  adenosine (ADENOCARD) 6 MG/2ML injection 18 mg (18 mg Intravenous Given 04/03/19 1551)  LORazepam (ATIVAN) injection 1 mg (1 mg Intravenous Given 04/03/19 2003)    Mobility walks with person assist High fall risk   Focused Assessments Cardiac Assessment Handoff:    Lab Results  Component Value Date   CKTOTAL 141 04/03/2019   CKMB 2.8 03/13/2009   TROPONINI 0.03 (HH) 01/10/2019   Lab Results  Component Value Date   DDIMER 3.82 (H) 01/09/2019   Does the Patient currently have chest pain? No     R Recommendations: See Admitting Provider Note  Report given to:   Additional Notes:

## 2019-04-03 NOTE — ED Notes (Signed)
Pt HR 180's then returned to 105. Episode happened 2x then sustained in 180's. Obtained EKG, placed pt on zoll, MD aware. Pt alert and oriented.

## 2019-04-04 ENCOUNTER — Inpatient Hospital Stay (HOSPITAL_COMMUNITY): Payer: Medicare Other

## 2019-04-04 DIAGNOSIS — F1910 Other psychoactive substance abuse, uncomplicated: Secondary | ICD-10-CM

## 2019-04-04 DIAGNOSIS — I480 Paroxysmal atrial fibrillation: Secondary | ICD-10-CM

## 2019-04-04 DIAGNOSIS — F10129 Alcohol abuse with intoxication, unspecified: Secondary | ICD-10-CM

## 2019-04-04 DIAGNOSIS — F1410 Cocaine abuse, uncomplicated: Secondary | ICD-10-CM

## 2019-04-04 DIAGNOSIS — I483 Typical atrial flutter: Secondary | ICD-10-CM

## 2019-04-04 DIAGNOSIS — N179 Acute kidney failure, unspecified: Secondary | ICD-10-CM

## 2019-04-04 DIAGNOSIS — I471 Supraventricular tachycardia: Secondary | ICD-10-CM

## 2019-04-04 LAB — BASIC METABOLIC PANEL
Anion gap: 11 (ref 5–15)
Anion gap: 11 (ref 5–15)
BUN: 12 mg/dL (ref 8–23)
BUN: 12 mg/dL (ref 8–23)
CO2: 14 mmol/L — ABNORMAL LOW (ref 22–32)
CO2: 15 mmol/L — ABNORMAL LOW (ref 22–32)
Calcium: 8.1 mg/dL — ABNORMAL LOW (ref 8.9–10.3)
Calcium: 8.2 mg/dL — ABNORMAL LOW (ref 8.9–10.3)
Chloride: 112 mmol/L — ABNORMAL HIGH (ref 98–111)
Chloride: 113 mmol/L — ABNORMAL HIGH (ref 98–111)
Creatinine, Ser: 2.29 mg/dL — ABNORMAL HIGH (ref 0.61–1.24)
Creatinine, Ser: 2.29 mg/dL — ABNORMAL HIGH (ref 0.61–1.24)
GFR calc Af Amer: 34 mL/min — ABNORMAL LOW (ref >60)
GFR calc Af Amer: 34 mL/min — ABNORMAL LOW (ref >60)
GFR calc non Af Amer: 29 mL/min — ABNORMAL LOW (ref >60)
GFR calc non Af Amer: 29 mL/min — ABNORMAL LOW (ref >60)
Glucose, Bld: 124 mg/dL — ABNORMAL HIGH (ref 70–99)
Glucose, Bld: 126 mg/dL — ABNORMAL HIGH (ref 70–99)
Potassium: 3.9 mmol/L (ref 3.5–5.1)
Potassium: 4.1 mmol/L (ref 3.5–5.1)
Sodium: 138 mmol/L (ref 135–145)
Sodium: 138 mmol/L (ref 135–145)

## 2019-04-04 LAB — PROTEIN / CREATININE RATIO, URINE
Creatinine, Urine: 96.92 mg/dL
Protein Creatinine Ratio: 2.28 mg/mg{Cre} — ABNORMAL HIGH (ref 0.00–0.15)
Total Protein, Urine: 221 mg/dL

## 2019-04-04 LAB — CBC
HCT: 44.3 % (ref 39.0–52.0)
Hemoglobin: 14.3 g/dL (ref 13.0–17.0)
MCH: 32.6 pg (ref 26.0–34.0)
MCHC: 32.3 g/dL (ref 30.0–36.0)
MCV: 100.9 fL — ABNORMAL HIGH (ref 80.0–100.0)
Platelets: 182 10*3/uL (ref 150–400)
RBC: 4.39 MIL/uL (ref 4.22–5.81)
RDW: 13.2 % (ref 11.5–15.5)
WBC: 7.5 10*3/uL (ref 4.0–10.5)
nRBC: 0 % (ref 0.0–0.2)

## 2019-04-04 LAB — GLUCOSE, CAPILLARY: Glucose-Capillary: 116 mg/dL — ABNORMAL HIGH (ref 70–99)

## 2019-04-04 LAB — TSH: TSH: 2.322 u[IU]/mL (ref 0.350–4.500)

## 2019-04-04 LAB — PHOSPHORUS: Phosphorus: 5.2 mg/dL — ABNORMAL HIGH (ref 2.5–4.6)

## 2019-04-04 LAB — MAGNESIUM: Magnesium: 1.8 mg/dL (ref 1.7–2.4)

## 2019-04-04 LAB — SODIUM, URINE, RANDOM: Sodium, Ur: 103 mmol/L

## 2019-04-04 MED ORDER — LORAZEPAM 1 MG PO TABS: 1.0000 mg | ORAL_TABLET | Freq: Four times a day (QID) | ORAL | Status: DC | PRN

## 2019-04-04 MED ORDER — METOPROLOL TARTRATE 25 MG PO TABS
25.0000 mg | ORAL_TABLET | Freq: Two times a day (BID) | ORAL | Status: DC
  Administered 2019-04-04 – 2019-04-06 (×4): 25 mg via ORAL
  Filled 2019-04-04 (×4): qty 1

## 2019-04-04 MED ORDER — THIAMINE HCL 100 MG/ML IJ SOLN: 100.0000 mg | Freq: Every day | INTRAMUSCULAR | Status: DC

## 2019-04-04 MED ORDER — VITAMIN B-1 100 MG PO TABS
100.0000 mg | ORAL_TABLET | Freq: Every day | ORAL | Status: DC
  Administered 2019-04-04 – 2019-04-06 (×3): 100 mg via ORAL
  Filled 2019-04-04 (×3): qty 1

## 2019-04-04 MED ORDER — ADULT MULTIVITAMIN W/MINERALS CH
1.0000 | ORAL_TABLET | Freq: Every day | ORAL | Status: DC
  Administered 2019-04-04: 1 via ORAL
  Filled 2019-04-04: qty 1

## 2019-04-04 MED ORDER — LORAZEPAM 2 MG/ML IJ SOLN: 1.0000 mg | Freq: Four times a day (QID) | INTRAMUSCULAR | Status: DC | PRN

## 2019-04-04 MED ORDER — CENTRUM SILVER 50+MEN PO TABS: 1.0000 | ORAL_TABLET | Freq: Every day | ORAL | Status: DC

## 2019-04-04 MED ORDER — ATORVASTATIN CALCIUM 10 MG PO TABS
10.0000 mg | ORAL_TABLET | Freq: Every day | ORAL | Status: DC
  Administered 2019-04-04 – 2019-04-05 (×2): 10 mg via ORAL
  Filled 2019-04-04 (×2): qty 1

## 2019-04-04 MED ORDER — TRAMADOL HCL 50 MG PO TABS
50.0000 mg | ORAL_TABLET | Freq: Four times a day (QID) | ORAL | Status: DC | PRN
  Administered 2019-04-04 – 2019-04-05 (×3): 50 mg via ORAL
  Filled 2019-04-04 (×3): qty 1

## 2019-04-04 MED ORDER — OXYCODONE HCL 5 MG PO TABS
5.0000 mg | ORAL_TABLET | Freq: Three times a day (TID) | ORAL | Status: DC | PRN
  Administered 2019-04-04 – 2019-04-05 (×3): 5 mg via ORAL
  Filled 2019-04-04 (×3): qty 1

## 2019-04-04 MED ORDER — FOLIC ACID 1 MG PO TABS
1.0000 mg | ORAL_TABLET | Freq: Every day | ORAL | Status: DC
  Administered 2019-04-04 – 2019-04-06 (×3): 1 mg via ORAL
  Filled 2019-04-04 (×3): qty 1

## 2019-04-04 MED ORDER — ADULT MULTIVITAMIN W/MINERALS CH
1.0000 | ORAL_TABLET | Freq: Every day | ORAL | Status: DC
  Administered 2019-04-05 – 2019-04-06 (×2): 1 via ORAL
  Filled 2019-04-04 (×2): qty 1

## 2019-04-04 MED ORDER — TRAZODONE HCL 100 MG PO TABS
100.0000 mg | ORAL_TABLET | Freq: Every day | ORAL | Status: DC
  Administered 2019-04-04 – 2019-04-05 (×2): 100 mg via ORAL
  Filled 2019-04-04 (×2): qty 1

## 2019-04-04 MED ORDER — SERTRALINE HCL 100 MG PO TABS
100.0000 mg | ORAL_TABLET | Freq: Every day | ORAL | Status: DC
  Administered 2019-04-05 – 2019-04-06 (×2): 100 mg via ORAL
  Filled 2019-04-04 (×2): qty 1

## 2019-04-04 NOTE — Progress Notes (Signed)
Pharmacy is attempting to complete a medication history on Mr. Boening but he is a poor historian and likely has been non-compliant with multiple or all meds on his list.   Romeo Rabon, PharmD. Mobile: [phone]. 04/04/2019,8:15 AM.

## 2019-04-04 NOTE — Progress Notes (Signed)
Pt's admission not completed during my care of this pt this shift d/t pt still having some confusion and being tired post multiple IV placement attempts, blood draw, and shift assessment. Will attempt to complete if pt is fully awake.

## 2019-04-04 NOTE — Progress Notes (Signed)
Dr. Baltazar Najjar informed of pt's Afib at this time.

## 2019-04-04 NOTE — Progress Notes (Signed)
   04/03/19 2330  Provider Notification  Provider Name/Title Dr Paticia Stack  Date Provider Notified 04/03/19  Time Provider Notified 2330  Notification Type Page  Notification Reason Other (Comment) (Pt's HR is still in the 140s, Troponin level of 105)  Response Other (Comment) (awaiting response)  Date of Provider Response 04/03/19  Time of Provider Response 2345

## 2019-04-04 NOTE — Consult Note (Addendum)
Cardiology Consultation:   Patient ID: Todd [name].; [id number]; 07-20-1956   Admit date: 04/03/2019 Date of Consult: 04/04/2019  Primary Care Provider: [name], MD Primary Cardiologist: New-Dr. Johnsie Cancel (remote)   Patient Profile:   Todd [name]. is a 63 y.o. male with a hx of diastolic dysfunction, alcohol and illicit drug use, obesity, OSA, PE, DVT, hep C, HTN, HLD and depression who is being seen today for the evaluation of SVT at the request of Dr. Marthenia Rolling.  . History of Present Illness:   Mr. Mcgue is a 63 yo M with a hx as stated above who presented to [facility] after being found unresponsive outside his home with an SpO2 of 85% per EMS reports. Information obtained from chart review as the patient does not recall the event. Neighbors indicate that he was intoxicated after "partying" overnight. EMS was called and on presentation to the ED, lab work positive for cocaine and opiates with an alcohol level of 70. On telemetry, he was found to be in SVT and therefore cardiology was to evaluate. He was started on IV diltiazem for greater rate control. Additionally he was treated with IV Narcan with response. Head CT negative for acute abnormalities. CXR with no acute cardiopulmonary processes. Hospitalist team asked to admit with cardiology consultation.   Overnight he had epsisodes of bradycardia and therefore the Diltiazem was discontinued. He is currently in NSR. He did have brief episodes of atrial fibrillation however these were not sustained and therefore do not warrant anticoagulation.   He was seen in the ED 08/2018 for what appears to be palpitations found to have sinus tachycardia. There is mention of AF with RVR but EKG shows SR. Plan was for him to follow OP at the AF clinic however this never occurred. He was on Eliquis for hx of DVT/PE. CHA2DS2VASc =2   Of note, he was last seen remotely by our service, 06/02/2009 after referral from his PCP for "a hole in my heart".  An echocardiogram was performed that showed no evidence of ASD or VSD. He had no complaints of chest pain and there was no murmur on exam. He was continued on lisinopril. He was noted to have RBBB on EKG which was noted to be normal for a patient with LAFB, likely in the setting of hypertension. Plan was to see him back as needed basis and he has not been seen since that time. He is followed by [facility] for hx of DVT s/p laser ablation. He was initially treated with Coumadin therapy however does not require AC now per [facility] noting.     Past Medical History:  Diagnosis Date   Arthritis    "fingers" (07/14/2018)   Depression    DVT (deep venous thrombosis) (HCC) LLE   Hepatitis C     finished harvoni tx ~ 2017   Hypercholesterolemia    Hypertension    Prostate cancer ([place]) 79yrs ago   Pulmonary embolism and infarction ([place]) 07/13/2018   Sleep apnea    not currently using cpap, mask causing vertigo   Type II diabetes mellitus ([place])     Past Surgical History:  Procedure Laterality Date   BIOPSY  01/12/2019   Procedure: BIOPSY;  Surgeon: Ronald Lobo, MD;  Location: [place];  Service: Endoscopy;;   COLONOSCOPY WITH PROPOFOL N/A 02/19/2014   Procedure: COLONOSCOPY WITH PROPOFOL;  Surgeon: Garlan Fair, MD;  Location: [facility];  Service: Endoscopy;  Laterality: N/A;   ENDOVENOUS ABLATION SAPHENOUS VEIN W/ LASER  Left 11/22/2017   endovenous laser ablation L [facility] by Tinnie Gens MD    ESOPHAGOGASTRODUODENOSCOPY (EGD) WITH PROPOFOL N/A 01/12/2019   Procedure: ESOPHAGOGASTRODUODENOSCOPY (EGD) WITH PROPOFOL;  Surgeon: Ronald Lobo, MD;  Location: [place];  Service: Endoscopy;  Laterality: N/A;  Patient is also scheduled for barium swallow; please notify radiology after patient's EGD is complete so that barium swallow follows the endoscopy, not vice versa   PROSTATECTOMY  2008   REPAIR QUADRICEPS / HAMSTRING MUSCLE Right      Prior to Admission medications   Medication  Sig Start Date End Date Taking? Authorizing Provider  atorvastatin (LIPITOR) 10 MG tablet Take 10 mg by mouth daily at 6 PM.  11/19/17  Yes [provider]  losartan (COZAAR) 25 MG tablet Take 25 mg by mouth daily.   Yes [provider]  metFORMIN (GLUCOPHAGE) 1000 MG tablet Take 1,000 mg by mouth 2 (two) times daily with a meal.   Yes [provider]  Multiple Vitamins-Minerals (CENTRUM SILVER 50+MEN) TABS Take 1 tablet by mouth daily with breakfast.   Yes [provider]  sertraline (ZOLOFT) 100 MG tablet Take 100 mg by mouth every morning.   Yes [provider]  traZODone (DESYREL) 100 MG tablet Take 1 tablet (100 mg total) by mouth at bedtime. 07/04/18  Yes Pennelope Bracken, MD  venlafaxine (EFFEXOR) 100 MG tablet Take 100 mg by mouth 2 (two) times daily.   Yes [provider]  DULoxetine (CYMBALTA) 60 MG capsule Take 1 capsule (60 mg total) by mouth daily. Patient not taking: Reported on 04/04/2019 07/05/18   Pennelope Bracken, MD  ELIQUIS STARTER PACK (ELIQUIS STARTER PACK) 5 MG TABS Take as directed on package: start with two-5mg  tablets twice daily for 7 days. On day 8, switch to one-5mg  tablet twice daily. Patient not taking: Reported on 04/03/2019 07/14/18   Patrecia Pour, MD    Inpatient Medications: Scheduled Meds:  folic acid  1 mg Oral Daily   heparin  5,000 Units Subcutaneous Q8H   metoprolol tartrate  25 mg Oral BID   multivitamin with minerals  1 tablet Oral Daily   Tdap  0.5 mL Intramuscular Once   thiamine  100 mg Oral Daily   Or   thiamine  100 mg Intravenous Daily   Continuous Infusions:  sodium chloride 125 mL/hr at 04/04/19 0020   naLOXone [facility]) adult infusion for OVERDOSE Stopped (04/03/19 1559)   PRN Meds: LORazepam **OR** LORazepam, traMADol  Allergies:   No Known Allergies  Social History:   Social History   Socioeconomic History   Marital status: Single    Spouse name: Not  on file   Number of children: 2   Years of education: 12th   Highest education level: Not on file  Occupational History   Occupation: post office  Social Needs   Financial resource strain: Not on file   Food insecurity    Worry: Not on file    Inability: Not on file   Transportation needs    Medical: Not on file    Non-medical: Not on file  Tobacco Use   Smoking status: Current Every Day Smoker    Packs/day: 0.12    Years: 45.00    Pack years: 5.40   Smokeless tobacco: Never Used  Substance and Sexual Activity   Alcohol use: Yes    Comment: 07/14/2018 "40oz beer/day"   Drug use: Not Currently    Comment: in recovery    Sexual activity:  Not Currently  Lifestyle   Physical activity    Days per week: Not on file    Minutes per session: Not on file   Stress: Not on file  Relationships   Social connections    Talks on phone: Not on file    Gets together: Not on file    Attends religious service: Not on file    Active member of club or organization: Not on file    Attends meetings of clubs or organizations: Not on file    Relationship status: Not on file   Intimate partner violence    Fear of current or ex partner: Not on file    Emotionally abused: Not on file    Physically abused: Not on file    Forced sexual activity: Not on file  Other Topics Concern   Not on file  Social History Narrative   ** Merged History Encounter **       Patient lives at home alone.Marland KitchenMarland KitchenDrinks coffee daily     Family History:   Family History  Problem Relation Age of Onset   Cancer Father        PROSTATE   Family Status:  Family Status  Relation Name Status   Mother  Deceased   Father  Alive   Sister  Alive   Brother  Disautel   Sister 1 Deceased    ROS:  Please see the history of present illness.  All other ROS reviewed and negative.     Physical Exam/Data:   Vitals:   04/04/19 0321 04/04/19 0516 04/04/19 0834 04/04/19 1205  BP: (!) 161/66 127/65 115/81  128/73  Pulse: 83 66 65 61  Resp: 16  14 17   Temp: 98 F (36.7 C)  99 F (37.2 C) 98.7 F (37.1 C)  TempSrc: Oral  Oral Oral  SpO2: 96%  95% 96%  Weight:      Height:        Intake/Output Summary (Last 24 hours) at 04/04/2019 1354 Last data filed at 04/04/2019 1212 Gross per 24 hour  Intake 4060 ml  Output 450 ml  Net 3610 ml   Filed Weights   04/03/19 2225  Weight: 105.8 kg   Body mass index is 34.44 kg/m.   General: Obese, NAD Neck: Negative for carotid bruits. No JVD Lungs:Clear to ausculation bilaterally. No wheezes, rales, or rhonchi. Breathing is unlabored. Cardiovascular: RRR with S1 S2. No murmurs, rubs, gallops, or LV heave appreciated. Abdomen: Soft, non-tender, non-distended. No obvious abdominal masses. Extremities: No edema. No clubbing or cyanosis. DP pulses 2+ bilaterally Neuro: Alert and oriented. No focal deficits. No facial asymmetry. MAE spontaneously. Psych: Responds to questions appropriately with normal affect.    EKG:  The EKG was personally reviewed and demonstrates: 04/03/2019 ST with HR 104, RBBB and LAFB, non-specific T wave abnormalities, similar to prior tracings Telemetry:  Telemetry was personally reviewed and demonstrates: 04/04/2019 NSR with PACs, HR 70-90s  Relevant CV Studies:  Echocardiogram: 07/14/2018  Study Conclusions  - Left ventricle: The cavity size was normal. Wall thickness was   increased in a pattern of moderate LVH. Systolic function was   normal. The estimated ejection fraction was in the range of 55%   to 60%. Incoordinate septal motion. Doppler parameters are   consistent with abnormal left ventricular relaxation (grade 1   diastolic dysfunction). The E/e&' ratio is >15, suggesting   elevated LV filling pressure. - Aorta: Ascending aortic diameter: 40 mm (S). - Ascending aorta: The ascending aorta  was mildly dilated. - Mitral valve: Mildly thickened leaflets . There was trivial   regurgitation. - Left atrium:  Moderately dilated. - Right atrium: The atrium was mildly dilated. - Tricuspid valve: There was trivial regurgitation. - Pulmonary arteries: PA peak pressure: 21 mm Hg (S). - Inferior vena cava: The vessel was normal in size. The   respirophasic diameter changes were in the normal range (>= 50%),   consistent with normal central venous pressure.  Impressions:  - LVEF 55-60%, moderate LVH, incoordinate septal motion, grade 1   DD, elevated LV filling pressure, dilated aorta to 4.0 cm,   trivial MR, moderate LAE, mild RAE, trivial TR, RVSP 21 mmHg,   normal IVC.  Laboratory Data:  Chemistry Recent Labs  Lab 04/03/19 1422 04/03/19 1526 04/04/19 0057  NA 140 142 138   138  K 3.9 3.2* 3.9   4.1  CL 106  --  112*   113*  CO2 14*  --  15*   14*  GLUCOSE 238*  --  126*   124*  BUN 12  --  12   12  CREATININE 2.19*  --  2.29*   2.29*  CALCIUM 9.2  --  8.1*   8.2*  GFRNONAA 31*  --  29*   29*  GFRAA 36*  --  34*   34*  ANIONGAP 20*  --  11   11    Total Protein  Date Value Ref Range Status  04/03/2019 7.8 6.5 - 8.1 g/dL Final   Albumin  Date Value Ref Range Status  04/03/2019 4.2 3.5 - 5.0 g/dL Final   AST  Date Value Ref Range Status  04/03/2019 391 (H) 15 - 41 U/L Final   ALT  Date Value Ref Range Status  04/03/2019 100 (H) 0 - 44 U/L Final   Alkaline Phosphatase  Date Value Ref Range Status  04/03/2019 89 38 - 126 U/L Final   Total Bilirubin  Date Value Ref Range Status  04/03/2019 0.8 0.3 - 1.2 mg/dL Final   Hematology Recent Labs  Lab 04/03/19 1422 04/03/19 1526 04/04/19 0057  WBC 5.3  --  7.5  RBC 4.80  --  4.39  HGB 15.5 15.6 14.3  HCT 48.8 46.0 44.3  MCV 101.7*  --  100.9*  MCH 32.3  --  32.6  MCHC 31.8  --  32.3  RDW 13.1  --  13.2  PLT 224  --  182   Cardiac EnzymesNo results for input(s): TROPONINI in the last 168 hours. No results for input(s): TROPIPOC in the last 168 hours.  BNPNo results for input(s): BNP, PROBNP in the last 168  hours.  DDimer No results for input(s): DDIMER in the last 168 hours. TSH:  Lab Results  Component Value Date   TSH 2.322 04/03/2019   Lipids: Lab Results  Component Value Date   CHOL  03/11/2009    128        ATP III CLASSIFICATION:  <200     mg/dL   Desirable  200-239  mg/dL   Borderline High  >=240    mg/dL   High          HDL 45 03/11/2009   LDLCALC  03/11/2009    71        Total Cholesterol/HDL:CHD Risk Coronary Heart Disease Risk Table                     Men   Women  1/2 Average  Risk   3.4   3.3  Average Risk       5.0   4.4  2 X Average Risk   9.6   7.1  3 X Average Risk  23.4   11.0        Use the calculated Patient Ratio above and the CHD Risk Table to determine the patient's CHD Risk.        ATP III CLASSIFICATION (LDL):  <100     mg/dL   Optimal  100-129  mg/dL   Near or Above                    Optimal  130-159  mg/dL   Borderline  160-189  mg/dL   High  >190     mg/dL   Very High   TRIG 59 03/11/2009   CHOLHDL 2.8 03/11/2009   HgbA1c: Lab Results  Component Value Date   HGBA1C 6.4 (H) 01/10/2019    Radiology/Studies:  Ct Head Wo Contrast  Result Date: 04/03/2019 CLINICAL DATA:  Found on ground.  Minor head trauma.  ETOH. EXAM: CT HEAD WITHOUT CONTRAST CT CERVICAL SPINE WITHOUT CONTRAST TECHNIQUE: Multidetector CT imaging of the head and cervical spine was performed following the standard protocol without intravenous contrast. Multiplanar CT image reconstructions of the cervical spine were also generated. COMPARISON:  CT head 04/13/2018 FINDINGS: CT HEAD FINDINGS Brain: Generalized atrophy. Patchy white matter hypodensity bilaterally similar to the prior study. Negative for acute infarct, hemorrhage, mass. Vascular: Negative for hyperdense vessel Skull: Negative Sinuses/Orbits: Chronic fracture left medial orbit. Mild mucosal edema paranasal sinuses. No orbital lesion. Other: None CT CERVICAL SPINE FINDINGS Alignment: Normal alignment.  Moderate  cervical kyphosis. Skull base and vertebrae: Negative for fracture Soft tissues and spinal canal: Negative Disc levels: Disc degeneration and spurring most prominent C5-6 and C6-7 causing mild foraminal narrowing bilaterally. Upper chest: Negative Other: None IMPRESSION: 1. No acute intracranial abnormality. Atrophy and white matter changes consistent with chronic microvascular ischemia 2. Negative for cervical spine fracture. Cervical spondylosis and kyphosis. Electronically Signed   By: Franchot Gallo M.D.   On: 04/03/2019 18:29   Ct Cervical Spine Wo Contrast  Result Date: 04/03/2019 CLINICAL DATA:  Found on ground.  Minor head trauma.  ETOH. EXAM: CT HEAD WITHOUT CONTRAST CT CERVICAL SPINE WITHOUT CONTRAST TECHNIQUE: Multidetector CT imaging of the head and cervical spine was performed following the standard protocol without intravenous contrast. Multiplanar CT image reconstructions of the cervical spine were also generated. COMPARISON:  CT head 04/13/2018 FINDINGS: CT HEAD FINDINGS Brain: Generalized atrophy. Patchy white matter hypodensity bilaterally similar to the prior study. Negative for acute infarct, hemorrhage, mass. Vascular: Negative for hyperdense vessel Skull: Negative Sinuses/Orbits: Chronic fracture left medial orbit. Mild mucosal edema paranasal sinuses. No orbital lesion. Other: None CT CERVICAL SPINE FINDINGS Alignment: Normal alignment.  Moderate cervical kyphosis. Skull base and vertebrae: Negative for fracture Soft tissues and spinal canal: Negative Disc levels: Disc degeneration and spurring most prominent C5-6 and C6-7 causing mild foraminal narrowing bilaterally. Upper chest: Negative Other: None IMPRESSION: 1. No acute intracranial abnormality. Atrophy and white matter changes consistent with chronic microvascular ischemia 2. Negative for cervical spine fracture. Cervical spondylosis and kyphosis. Electronically Signed   By: Franchot Gallo M.D.   On: 04/03/2019 18:29   US  Renal  Result Date: 04/04/2019 CLINICAL DATA:  Acute kidney injury. EXAM: RENAL / URINARY TRACT ULTRASOUND COMPLETE COMPARISON:  CT of the abdomen and pelvis 01/09/2019 FINDINGS: Right Kidney:  Renal measurements: 10.3 x 5.4 x 6.0 cm = volume: 176 mL. Renal parenchyma is isoechoic to the index organ, the liver. A simple cyst measures 1.9 cm in the interpolar region of the kidney anteriorly. Left Kidney: Renal measurements: 0.9 x 5.9 x 5.8 = volume: 230 mL. Two cysts or a single septated cyst is present near the lower pole of the left kidney. These otherwise appear benign. A simple cyst is present at the upper pole. Maximum dimension of any cyst is 1.5 cm. The renal parenchyma is isoechoic to the index organ, the spleen. Bladder: Empty IMPRESSION: 1. Renal parenchyma is mildly hyperechoic bilaterally. This is nonspecific, but can be seen in the setting of medical renal disease. 2. Bilateral renal cysts. 3. No solid mass lesion or obstructive disease in either kidney to explain acute kidney injury otherwise. Electronically Signed   By: San Morelle M.D.   On: 04/04/2019 05:54   Dg Chest Port 1 View  Result Date: 04/03/2019 CLINICAL DATA:  63 year old male with mental status change found down outside his apartment. EXAM: PORTABLE CHEST 1 VIEW COMPARISON:  Prior chest x-ray 01/16/2019 FINDINGS: External defibrillator pads project over the left chest. The cardiac and mediastinal contours remain unchanged. Trace atherosclerotic calcifications can be seen in the transverse aorta. Stable background bronchitic changes and mild interstitial prominence. No edema, airspace opacity, pleural effusion or pneumothorax. Inspiratory volumes are slightly low. Osseous structures are intact and without evidence of acute injury. Chronic degenerative changes of the right shoulder. IMPRESSION: Stable chest x-ray without evidence of acute cardiopulmonary process. Electronically Signed   By: Jacqulynn Cadet M.D.   On:  04/03/2019 16:07    Assessment and Plan:   1. SVT/Aflutter: -Pt presented to ED after being found intoxicated outside and presumed unresponsive. UDS positive for cocaine, opiates and alcohol level was 70.Has had multiple ED visits for intoxication. On presentation, telemetry revealed SVT, likely in the settingof alcohol and substance abuse. He was started on Diltiazem gtt with adequate response however became bradycardiac and it was therefore discontinued. He is currently in NSR with PACs.  -Seen in the ED 08/2018 for what appears to be palpitations found to have sinus tachycardia. There is mention of AF with RVR but EKG shows SR. Plan was for him to follow OP at the AF clinic however this never occurred. He was noted to be on Eliquis for hx of DVT/PE.  -HsT found to be mildly elevated at 93>>105  -Had brief chest pain with AF RVR however resolved now  -TSH, 2.322 -Previous echocardiogram from 07/14/2018 with LVEF 55-60%, moderate LVH, incoordinate septal motion, grade 1DD, elevated LV filling pressure, dilated aorta to 4.0 cm, trivial MR, moderate LAE, mild RAE, trivial TR, RVSP 21 mmHg, normal IVC. -Likely in the setting of severe alcohol abuse>>discussed cessation -No need for anticoagulation at this time given brief episode. May need monitor at d/c to further evaluate for AF frequency   -CHA2DS2VASc =2  2. Polysubstance abuse: -Pt admits to long hx of polysubstance abuse with cocaine and alcohol  -Would continue CIWA protocol and anticipate possible WD symptoms  -Discussed cessation in the setting of poor health outcomes with prolonged continued use and presumed recurrence of SVT   3. AKI: -Creatinine on arrival, 2.29 -In the setting of abundant alcohol ingestion  -Treated with IV fluid hydration per primary team  -[place] without acute changes   4. Transaminitis: -AST/ALT elevated on arrival, 391/100>>>in the setting of alcohol abuse -Per primary team   5. Dilated aorta: -  Noted on  echo from 06/2018 with a dilated ascending aorta at 4.0cm -CT from 08/2018 with ascending aorta at 3.9cm  -Will need serial follow up in the OP setting and strict BP control with BP goal of SBP <168mmHg    For questions or updates, please contact [facility] Please consult [email or web address] for contact info under Cardiology/STEMI.   SignedKathyrn Drown NP-C [facility] Pager: [phone] 04/04/2019 1:54 PM  The patient was seen, examined and discussed with Kathyrn Drown, NP  and I agree with the above.   63 y.o. male with a hx of diastolic dysfunction, alcohol and illicit drug use, obesity, OSA, PE, DVT, hep C, HTN, HLD and depression who is being seen today for the evaluation of SVT. He was found unresponsive outside his home with an SpO2 of 85% per EMS reports, he was found to be overdosed on cocaine and alcohol as well as opiates.  On telemetry, he was found to be in SVT and therefore cardiology was to evaluate.  He was treated with IV Narcan and IV Cardizem that was discontinued after he cardioverted to back to sinus and developed sinus bradycardia.  I have reviewed telemetry that shows episode of atrial flutter rate 2-1 block, variable block as well as atrial fibrillation, he is currently in sinus rhythm with PVCs.  Patient states that he is not always compliant with his medication and he is using cocaine on a daily basis.   Physical exam shows a gentleman in no distress, no JVDs, regular rate and rhythm, no murmur, clear lungs, warm extremities with good peripheral pulses.  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  Mainstay of therapy here will be compliance with his medications, and abstinence from  drug especially cocaine.  I would continue metoprolol 25 mg p.o. twice daily.  The patient is already on Eliquis for hx of DVT/PE. CHA2DS2VASc =2.  [facility] will sign off.   Medication Recommendations: As above Other recommendations (labs, testing, etc): No further testing Follow up as an outpatient: We will arrange  Please call us with any questions or if patient has recurrence of atrial flutter or atrial fibrillation.  Ena Dawley, MD 04/04/2019

## 2019-04-04 NOTE — Progress Notes (Signed)
PROGRESS NOTE    Todd [name].  KVQ:[id number] DOB: 08-03-1956 DOA: 04/03/2019 PCP: [name], MD   Brief Narrative: As per and metabolic acidosis: 63 year old African-American male with past medical history significant for alcohol abuse, illicit drug use, obesity, OSA, PE, DVT, hep C, prostate cancer, hypertension, hyperlipidemia and depression.  Patient was found outside unresponsive with O2 sat of 85%.  Collateral information from the neighbors indicated that patient has been intoxicated.  On presentation to the hospital, patient was unresponsive but now awake and alert, but remains a poor historian.  Patient only told me that he was partying last night.  Lab work done on presentation was positive for cocaine, opiates and the alcohol level was 70.  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  As per the ER provider, cardiology has recommended IV Cardizem.  No fever or chills, headache, no neck pain, no chest pain, no shortness of breath, no GI symptoms or urinary symptoms.  WBC is within normal range.  Potassium is 3.2, CO2 is 14, BUN is 12 with serum creatinine of 2.19 and lactic acid of 2.1.  CT scan of the head and cervical spine have not shown any acute findings.  Chest x-ray is within normal range.  UA reveals small hemoglobin, RBC of 0-5, specific gravity of 1.010 with presence of granular and hyaline casts with rare bacteria.  Hospitalist team has been asked to admit patient for further assessment and management.  ED Course: On presentation to the ER, patient was unresponsive.  Patient was treated with IV Narcan.  Patient is non-awake and alert.  Patient has remained afebrile.  Heart rate has ranged from 62 to 186 bpm, respiratory rate of 18, blood pressure of 94-175/55-101 mmHg with O2 sat of 80% 100%.  Pertinent labs as documented above.  ER team is consulted cardiology.  IV Cardizem has been tried for the SVTs.  Hospitalist team has been asked to  admit patient  8/19: Patient mental status improved, heart rate is stable. Still with ongoing AKI and metabolic acidosis.  Subjective: SITTING ON BEDSIDE CHAIR- aaox3, O2 AT 96% Nasal Cannula not in nose but in cheek. Reports he got drunk yesterday and that caused the episode.  Assessment & Plan:   Altered mental status/unconsciousness: from etoh and polysubstance intoxication. CTB neg- non focal on exam. UDS + for Cocaine/opiates. Thsi am AAOX3. MONITOR  SVT in ER/arrhythmia/tachycardia: likely from substance use, etoh use. HR stable after adenosine multiple doses, ivf fluids and cardizem infusion/Metoprolol po TID. Cardizem infusion discontinued as patient became bradycardic in the morning. Cardio was consulted from ER- cont plan as per cardio- hopefully change to po med soon. slightly + trop at 101- likely form SVT- deferred to cardio. Check tsh.  Volume depletion/metabolci acidosis : from eoth uses, substance abuse. hco3 at 14, anion gap closed from 20-->11. UA no ketone- cont aggressive saline hydration, repeat bmn  Alcohol abuse: He is a heavy alcohol.  Cont CIWA Ativan for withdrawal symptoms.  Add thiamine folate and multivitamins.. Admits drinking 2 large bottle wine almost daily.  Discussed about need for cessation.  Illicit substance abuse: cessation advised  Acute kidney injury on CKD III: Suspect ATN versus prerenal. F/u urine lytes. Rena; [place] okay. F/u UPC, C3/C4, ANA- likely low yield. Cont ivf.   Transaminitis- w AST>>ALT- likely etoh use- repeat in am.  Shoulder pain- tramadol prn  Body mass index is 34.44 kg/m.   DVT prophylaxis:Heparin Code Status: FULL Family  Communication: plan of care discussed with patient in detail.  Disposition Plan: Remains inpatient pending clinical improvement.   Consultants:  Cardiology  Procedures: None  Microbiology: None  Antimicrobials: Anti-infectives (From admission, onward)   None     Objective: Vitals:    04/04/19 0006 04/04/19 0321 04/04/19 0516 04/04/19 0834  BP: (!) 124/94 (!) 161/66 127/65 115/81  Pulse: 89 83 66 65  Resp: 14 16  14   Temp: 98.6 F (37 C) 98 F (36.7 C)  99 F (37.2 C)  TempSrc: Oral Oral  Oral  SpO2:  96%  95%  Weight:      Height:        Intake/Output Summary (Last 24 hours) at 04/04/2019 0944 Last data filed at 04/04/2019 0835 Gross per 24 hour  Intake 4060 ml  Output 350 ml  Net 3710 ml   Filed Weights   04/03/19 2225  Weight: 105.8 kg   Weight change:   Body mass index is 34.44 kg/m.  Intake/Output from previous day: 08/18 0701 - 08/19 0700 In: 3460 [I.V.:1460; IV Piggyback:2000] Out: 150 [Urine:150] Intake/Output this shift: Total I/O In: 600 [P.O.:600] Out: 200 [Urine:200]  Examination:  General exam: Appears calm and comfortable, AAOX3 HEENT:PERRL,Oral mucosa moist, Ear/Nose normal on gross exam Respiratory system: Bilateral equal air entry, normal vesicular breath sounds, no wheezes or crackles  Cardiovascular system: S1 & S2 heard,No JVD, murmurs. Gastrointestinal system: Abdomen is  soft, non tender, non distended, BS +  Nervous System:Alert and oriented. No focal neurological deficits/moving extremities, sensation intact. Extremities: No edema, no clubbing, distal peripheral pulses palpable. Skin: No rashes, lesions, no icterus MSK: Normal muscle bulk,tone ,power  Medications:  Scheduled Meds:  heparin  5,000 Units Subcutaneous Q8H   metoprolol tartrate  25 mg Oral Q8H   Tdap  0.5 mL Intramuscular Once   Continuous Infusions:  sodium chloride 125 mL/hr at 04/04/19 0020   diltiazem (CARDIZEM) infusion 15 mg/hr (04/04/19 0539)   naLOXone (NARCAN) adult infusion for OVERDOSE Stopped (04/03/19 1559)    Data Reviewed: I have personally reviewed following labs and imaging studies  CBC: Recent Labs  Lab 04/03/19 1422 04/03/19 1526 04/04/19 0057  WBC 5.3  --  7.5  NEUTROABS 2.4  --   --   HGB 15.5 15.6 14.3  HCT 48.8  46.0 44.3  MCV 101.7*  --  100.9*  PLT 224  --  675   Basic Metabolic Panel: Recent Labs  Lab 04/03/19 1422 04/03/19 1526 04/04/19 0057  NA 140 142 138   138  K 3.9 3.2* 3.9   4.1  CL 106  --  112*   113*  CO2 14*  --  15*   14*  GLUCOSE 238*  --  126*   124*  BUN 12  --  12   12  CREATININE 2.19*  --  2.29*   2.29*  CALCIUM 9.2  --  8.1*   8.2*  MG  --   --  1.8  PHOS  --   --  5.2*   GFR: Estimated Creatinine Clearance: 39.6 mL/min (A) (by C-G formula based on SCr of 2.29 mg/dL (H)). Liver Function Tests: Recent Labs  Lab 04/03/19 1422  AST 391*  ALT 100*  ALKPHOS 89  BILITOT 0.8  PROT 7.8  ALBUMIN 4.2   No results for input(s): LIPASE, AMYLASE in the last 168 hours. Recent Labs  Lab 04/03/19 1519  AMMONIA 57*   Coagulation Profile: Recent Labs  Lab 04/03/19 1519  INR 1.2   Cardiac Enzymes: Recent Labs  Lab 04/03/19 1422  CKTOTAL 141   BNP (last 3 results) No results for input(s): PROBNP in the last 8760 hours. HbA1C: No results for input(s): HGBA1C in the last 72 hours. CBG: Recent Labs  Lab 04/03/19 1413 04/03/19 2226 04/04/19 0633  GLUCAP 226* 102* 116*   Lipid Profile: No results for input(s): CHOL, HDL, LDLCALC, TRIG, CHOLHDL, LDLDIRECT in the last 72 hours. Thyroid Function Tests: No results for input(s): TSH, T4TOTAL, FREET4, T3FREE, THYROIDAB in the last 72 hours. Anemia Panel: No results for input(s): VITAMINB12, FOLATE, FERRITIN, TIBC, IRON, RETICCTPCT in the last 72 hours. Sepsis Labs: Recent Labs  Lab 04/03/19 1519 04/03/19 1951 04/03/19 2230  LATICACIDVEN 4.2* 2.1* 1.7    Recent Results (from the past 240 hour(s))  SARS Coronavirus 2 [facility] order, Performed in [facility] hospital lab) Nasopharyngeal Nasopharyngeal Swab     Status: None   Collection Time: 04/03/19  2:32 PM   Specimen: Nasopharyngeal Swab  Result Value Ref Range Status   SARS Coronavirus 2 NEGATIVE NEGATIVE Final    Comment: (NOTE) If result is  NEGATIVE SARS-CoV-2 target nucleic acids are NOT DETECTED. The SARS-CoV-2 RNA is generally detectable in upper and lower  respiratory specimens during the acute phase of infection. The lowest  concentration of SARS-CoV-2 viral copies this assay can detect is 250  copies / mL. A negative result does not preclude SARS-CoV-2 infection  and should not be used as the sole basis for treatment or other  patient management decisions.  A negative result may occur with  improper specimen collection / handling, submission of specimen other  than nasopharyngeal swab, presence of viral mutation(s) within the  areas targeted by this assay, and inadequate number of viral copies  (<250 copies / mL). A negative result must be combined with clinical  observations, patient history, and epidemiological information. If result is POSITIVE SARS-CoV-2 target nucleic acids are DETECTED. The SARS-CoV-2 RNA is generally detectable in upper and lower  respiratory specimens dur ing the acute phase of infection.  Positive  results are indicative of active infection with SARS-CoV-2.  Clinical  correlation with patient history and other diagnostic information is  necessary to determine patient infection status.  Positive results do  not rule out bacterial infection or co-infection with other viruses. If result is PRESUMPTIVE POSTIVE SARS-CoV-2 nucleic acids MAY BE PRESENT.   A presumptive positive result was obtained on the submitted specimen  and confirmed on repeat testing.  While 2019 novel coronavirus  (SARS-CoV-2) nucleic acids may be present in the submitted sample  additional confirmatory testing may be necessary for epidemiological  and / or clinical management purposes  to differentiate between  SARS-CoV-2 and other Sarbecovirus currently known to infect humans.  If clinically indicated additional testing with an alternate test  methodology [phone]) is advised. The SARS-CoV-2 RNA is generally  detectable  in upper and lower respiratory sp ecimens during the acute  phase of infection. The expected result is Negative. Fact Sheet for Patients:  StrictlyIdeas.no Fact Sheet for Healthcare Providers: BankingDealers.co.za This test is not yet approved or cleared by the [place] FDA and has been authorized for detection and/or diagnosis of SARS-CoV-2 by FDA under an Emergency Use Authorization (EUA).  This EUA will remain in effect (meaning this test can be used) for the duration of the COVID-19 declaration under Section 564(b)(1) of the Act, 21 U.S.C. section 360bbb-3(b)(1), unless the authorization is terminated or revoked sooner. Performed at  [facility], [place] [address] [place], [place] [address]       Radiology Studies: Ct Head Wo Contrast  Result Date: 04/03/2019 CLINICAL DATA:  Found on ground.  Minor head trauma.  ETOH. EXAM: CT HEAD WITHOUT CONTRAST CT CERVICAL SPINE WITHOUT CONTRAST TECHNIQUE: Multidetector CT imaging of the head and cervical spine was performed following the standard protocol without intravenous contrast. Multiplanar CT image reconstructions of the cervical spine were also generated. COMPARISON:  CT head 04/13/2018 FINDINGS: CT HEAD FINDINGS Brain: Generalized atrophy. Patchy white matter hypodensity bilaterally similar to the prior study. Negative for acute infarct, hemorrhage, mass. Vascular: Negative for hyperdense vessel Skull: Negative Sinuses/Orbits: Chronic fracture left medial orbit. Mild mucosal edema paranasal sinuses. No orbital lesion. Other: None CT CERVICAL SPINE FINDINGS Alignment: Normal alignment.  Moderate cervical kyphosis. Skull base and vertebrae: Negative for fracture Soft tissues and spinal canal: Negative Disc levels: Disc degeneration and spurring most prominent C5-6 and C6-7 causing mild foraminal narrowing bilaterally. Upper chest: Negative Other: None IMPRESSION: 1. No acute intracranial  abnormality. Atrophy and white matter changes consistent with chronic microvascular ischemia 2. Negative for cervical spine fracture. Cervical spondylosis and kyphosis. Electronically Signed   By: Franchot Gallo M.D.   On: 04/03/2019 18:29   Ct Cervical Spine Wo Contrast  Result Date: 04/03/2019 CLINICAL DATA:  Found on ground.  Minor head trauma.  ETOH. EXAM: CT HEAD WITHOUT CONTRAST CT CERVICAL SPINE WITHOUT CONTRAST TECHNIQUE: Multidetector CT imaging of the head and cervical spine was performed following the standard protocol without intravenous contrast. Multiplanar CT image reconstructions of the cervical spine were also generated. COMPARISON:  CT head 04/13/2018 FINDINGS: CT HEAD FINDINGS Brain: Generalized atrophy. Patchy white matter hypodensity bilaterally similar to the prior study. Negative for acute infarct, hemorrhage, mass. Vascular: Negative for hyperdense vessel Skull: Negative Sinuses/Orbits: Chronic fracture left medial orbit. Mild mucosal edema paranasal sinuses. No orbital lesion. Other: None CT CERVICAL SPINE FINDINGS Alignment: Normal alignment.  Moderate cervical kyphosis. Skull base and vertebrae: Negative for fracture Soft tissues and spinal canal: Negative Disc levels: Disc degeneration and spurring most prominent C5-6 and C6-7 causing mild foraminal narrowing bilaterally. Upper chest: Negative Other: None IMPRESSION: 1. No acute intracranial abnormality. Atrophy and white matter changes consistent with chronic microvascular ischemia 2. Negative for cervical spine fracture. Cervical spondylosis and kyphosis. Electronically Signed   By: Franchot Gallo M.D.   On: 04/03/2019 18:29   US Renal  Result Date: 04/04/2019 CLINICAL DATA:  Acute kidney injury. EXAM: RENAL / URINARY TRACT ULTRASOUND COMPLETE COMPARISON:  CT of the abdomen and pelvis 01/09/2019 FINDINGS: Right Kidney: Renal measurements: 10.3 x 5.4 x 6.0 cm = volume: 176 mL. Renal parenchyma is isoechoic to the index organ, the  liver. A simple cyst measures 1.9 cm in the interpolar region of the kidney anteriorly. Left Kidney: Renal measurements: 0.9 x 5.9 x 5.8 = volume: 230 mL. Two cysts or a single septated cyst is present near the lower pole of the left kidney. These otherwise appear benign. A simple cyst is present at the upper pole. Maximum dimension of any cyst is 1.5 cm. The renal parenchyma is isoechoic to the index organ, the spleen. Bladder: Empty IMPRESSION: 1. Renal parenchyma is mildly hyperechoic bilaterally. This is nonspecific, but can be seen in the setting of medical renal disease. 2. Bilateral renal cysts. 3. No solid mass lesion or obstructive disease in either kidney to explain acute kidney injury otherwise. Electronically Signed   By: San Morelle  M.D.   On: 04/04/2019 05:54   Dg Chest Port 1 View  Result Date: 04/03/2019 CLINICAL DATA:  63 year old male with mental status change found down outside his apartment. EXAM: PORTABLE CHEST 1 VIEW COMPARISON:  Prior chest x-ray 01/16/2019 FINDINGS: External defibrillator pads project over the left chest. The cardiac and mediastinal contours remain unchanged. Trace atherosclerotic calcifications can be seen in the transverse aorta. Stable background bronchitic changes and mild interstitial prominence. No edema, airspace opacity, pleural effusion or pneumothorax. Inspiratory volumes are slightly low. Osseous structures are intact and without evidence of acute injury. Chronic degenerative changes of the right shoulder. IMPRESSION: Stable chest x-ray without evidence of acute cardiopulmonary process. Electronically Signed   By: Jacqulynn Cadet M.D.   On: 04/03/2019 16:07    LOS: 1 day   Time spent: More than 50% of that time was spent in counseling and/or coordination of care.  Antonieta Pert, MD [facility]  04/04/2019, 9:44 AM

## 2019-04-04 NOTE — TOC Initial Note (Signed)
Transition of Care (TOC) - Initial/Assessment Note    Patient Details  Name: Todd [name]. MRN: [id number] Date of Birth: [birth year]  Transition of Care [facility]) CM/SW Contact:    Pollie Friar, RN Phone Number: 04/04/2019, 3:37 PM  Clinical Narrative:                 Pt was to go to drug/alcohol rehab in [place] today. He is planning to attempt to get in again after d/c. CM provided him resources for outpatient/inpatient drug rehab in this area. Pt appreciative.  Pt states he has friends and a sister that can check on him at home. He uses the bus for most of his transportation.  [facility]: [facility]. TOC following for d/c needs.   Expected Discharge Plan: Home/Self Care Barriers to Discharge: Continued Medical Work up   Patient Goals and CMS Choice        Expected Discharge Plan and Services Expected Discharge Plan: Home/Self Care   Discharge Planning Services: CM Consult                                          Prior Living Arrangements/Services   Lives with:: Self Patient language and need for interpreter reviewed:: Yes(no needs) Do you feel safe going back to the place where you live?: Yes      Need for Family Participation in Patient Care: No (Comment) Care giver support system in place?: No (comment) Current home services: DME(walker, shower bars) Criminal Activity/Legal Involvement Pertinent to Current Situation/Hospitalization: No - Comment as needed  Activities of Daily Living Home Assistive Devices/Equipment: Cane (specify quad or straight) ADL Screening (condition at time of admission) Patient's cognitive ability adequate to safely complete daily activities?: Yes Is the patient deaf or have difficulty hearing?: No Does the patient have difficulty seeing, even when wearing glasses/contacts?: No Does the patient have difficulty concentrating, remembering, or making decisions?: No Patient able to express need for assistance  with ADLs?: Yes Does the patient have difficulty dressing or bathing?: Yes Independently performs ADLs?: Yes (appropriate for developmental age) Does the patient have difficulty walking or climbing stairs?: Yes Weakness of Legs: Both Weakness of Arms/Hands: None  Permission Sought/Granted                  Emotional Assessment Appearance:: Appears stated age Attitude/Demeanor/Rapport: Engaged Affect (typically observed): Accepting, Pleasant Orientation: : Oriented to Self, Oriented to Place, Oriented to  Time, Oriented to Situation Alcohol / Substance Use: Alcohol Use, Illicit Drugs Psych Involvement: No (comment)  Admission diagnosis:  SVT (supraventricular tachycardia) (HCC) [I47.1] Altered mental status, unspecified altered mental status type [R41.82] Patient Active Problem List   Diagnosis Date Noted  . AMS (altered mental status) 04/03/2019  . Acute kidney failure ([place]) 01/09/2019  . Pulmonary embolus and infarction ([place]) 07/13/2018  . Opioid use disorder, moderate, dependence ([place]) 06/29/2018  . Alcohol use disorder, severe, dependence ([place]) 06/27/2018  . MDD (major depressive disorder), recurrent severe, without psychosis ([place]) 06/26/2018  . Varicose veins of left lower extremity with complications 46/27/0350  . Benign paroxysmal positional vertigo 11/08/2013  . RBBB 06/02/2009  . 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 DM (diabetes mellitus), type 2 ([place]) 04/11/2009  . OBESITY 04/11/2009  . DEPRESSION 04/11/2009  . Essential hypertension 04/11/2009  . DIVERTICULAR DISEASE 04/11/2009  . CHOLECYSTITIS 04/11/2009  . CHEST PAIN 04/11/2009  .  ABDOMINAL PAIN 04/11/2009   PCP:  [name], MD Pharmacy:   [place]), [place] - [address] [place] [address] Phone: [phone] Fax: [place], [place] - [address] [place] [address] Phone: [phone] Fax: [phone]     Social Determinants of Health (SDOH) Interventions    Readmission Risk Interventions No flowsheet data found.

## 2019-04-04 NOTE — Progress Notes (Signed)
Dr. Baltazar Najjar informed of pt's onset of Afib at 2355 04/03/2019.

## 2019-04-04 NOTE — Progress Notes (Signed)
PT Cancellation Note  Patient Details Name: Todd [name]. MRN: [id number] DOB: [birth year]   Cancelled Treatment:    Reason Eval/Treat Not Completed: Patient declined, no reason specified Pt adamantly refusing stating "I'm not walking, I need my medicine." Will follow up as schedule allows.   Leighton Ruff, PT, DPT  Acute Rehabilitation Services  Pager: [phone] Office: [phone]  Rudean Hitt 04/04/2019, 5:19 PM

## 2019-04-04 NOTE — Progress Notes (Signed)
Admission screening completed at this time.

## 2019-04-04 NOTE — Plan of Care (Signed)
  Problem: Health Behavior/Discharge Planning: Goal: Ability to manage health-related needs will improve Outcome: Progressing   Problem: Clinical Measurements: Goal: Ability to maintain clinical measurements within normal limits will improve Outcome: Progressing Goal: Will remain free from infection Outcome: Progressing Goal: Diagnostic test results will improve Outcome: Progressing Goal: Respiratory complications will improve Outcome: Progressing   Problem: Nutrition: Goal: Adequate nutrition will be maintained Outcome: Progressing   Problem: Coping: Goal: Level of anxiety will decrease Outcome: Progressing   Problem: Pain Managment: Goal: General experience of comfort will improve Outcome: Progressing   Problem: Safety: Goal: Ability to remain free from injury will improve Outcome: Progressing   Problem: Skin Integrity: Goal: Risk for impaired skin integrity will decrease Outcome: Progressing   Problem: Education: Goal: Knowledge of disease or condition will improve Outcome: Progressing Goal: Understanding of discharge needs will improve Outcome: Progressing   Problem: Health Behavior/Discharge Planning: Goal: Ability to identify changes in lifestyle to reduce recurrence of condition will improve Outcome: Progressing Goal: Identification of resources available to assist in meeting health care needs will improve Outcome: Progressing   Problem: Physical Regulation: Goal: Complications related to the disease process, condition or treatment will be avoided or minimized Outcome: Progressing   Problem: Safety: Goal: Ability to remain free from injury will improve Outcome: Progressing   Ival Bible, BSN, RN

## 2019-04-05 LAB — COMPREHENSIVE METABOLIC PANEL
ALT: 50 U/L — ABNORMAL HIGH (ref 0–44)
AST: 65 U/L — ABNORMAL HIGH (ref 15–41)
Albumin: 3 g/dL — ABNORMAL LOW (ref 3.5–5.0)
Alkaline Phosphatase: 61 U/L (ref 38–126)
Anion gap: 8 (ref 5–15)
BUN: 6 mg/dL — ABNORMAL LOW (ref 8–23)
CO2: 20 mmol/L — ABNORMAL LOW (ref 22–32)
Calcium: 8.5 mg/dL — ABNORMAL LOW (ref 8.9–10.3)
Chloride: 113 mmol/L — ABNORMAL HIGH (ref 98–111)
Creatinine, Ser: 1.03 mg/dL (ref 0.61–1.24)
GFR calc Af Amer: >60 mL/min (ref >60)
GFR calc non Af Amer: >60 mL/min (ref >60)
Glucose, Bld: 114 mg/dL — ABNORMAL HIGH (ref 70–99)
Potassium: 3.3 mmol/L — ABNORMAL LOW (ref 3.5–5.1)
Sodium: 141 mmol/L (ref 135–145)
Total Bilirubin: 0.6 mg/dL (ref 0.3–1.2)
Total Protein: 6.1 g/dL — ABNORMAL LOW (ref 6.5–8.1)

## 2019-04-05 LAB — ANTINUCLEAR ANTIBODIES, IFA: ANA Ab, IFA: NEGATIVE

## 2019-04-05 LAB — C3 COMPLEMENT: C3 Complement: 95 mg/dL (ref 82–167)

## 2019-04-05 LAB — C4 COMPLEMENT: Complement C4, Body Fluid: 23 mg/dL (ref 14–44)

## 2019-04-05 MED ORDER — LABETALOL HCL 5 MG/ML IV SOLN
10.0000 mg | INTRAVENOUS | Status: DC | PRN
  Administered 2019-04-05: 10 mg via INTRAVENOUS
  Filled 2019-04-05: qty 4

## 2019-04-05 MED ORDER — POTASSIUM CHLORIDE CRYS ER 20 MEQ PO TBCR
40.0000 meq | EXTENDED_RELEASE_TABLET | Freq: Two times a day (BID) | ORAL | Status: AC
  Administered 2019-04-05 (×2): 40 meq via ORAL
  Filled 2019-04-05 (×2): qty 2

## 2019-04-05 MED ORDER — LOPERAMIDE HCL 2 MG PO CAPS
2.0000 mg | ORAL_CAPSULE | Freq: Four times a day (QID) | ORAL | Status: DC | PRN
  Administered 2019-04-05: 17:00:00 2 mg via ORAL
  Filled 2019-04-05: qty 1

## 2019-04-05 NOTE — Evaluation (Signed)
Physical Therapy Evaluation Patient Details Name: Todd [name]. MRN: [id number] DOB: 1955/09/01 Today's Date: 04/05/2019   History of Present Illness  63 year old African-American male with history of alcohol abuse, illicit drug use, obesity, OSA, PE, DVT, hep C, prostate cancer and hypertension hyperlipidemia and depression found outside his home unresponsive and hypoxic with intoxication. Found to have acute renal failure and had bout of SVT.  Clinical Impression  Patient presents with decreased independence with mobility due to generalized weakness and decreased activity tolerance.  Feel he will benefit from skilled PT in the acute setting to allow return home with follow up HHPT.  Reports flight to enter home and will follow up to ensure safety with entry.      Follow Up Recommendations Home health PT;Supervision - Intermittent    Equipment Recommendations  None recommended by PT    Recommendations for Other Services       Precautions / Restrictions Precautions Precautions: Fall      Mobility  Bed Mobility Overal bed mobility: Needs Assistance Bed Mobility: Supine to Sit     Supine to sit: Min assist;HOB elevated     General bed mobility comments: pulls up with hand hold A  Transfers Overall transfer level: Needs assistance Equipment used: Rolling walker (2 wheeled) Transfers: Sit to/from Stand Sit to Stand: Min guard         General transfer comment: assist for safety, stood from toilet unaided in bathroom  Ambulation/Gait Ambulation/Gait assistance: Min guard Gait Distance (Feet): 15 Feet(x 2) Assistive device: Rolling walker (2 wheeled) Gait Pattern/deviations: Step-through pattern;Trunk flexed     General Gait Details: mildly unsteady initially on the way to bathroom and rushing, back to chair slight improvement in balance and safety  Stairs            Wheelchair Mobility    Modified Rankin (Stroke Patients Only)       Balance Overall  balance assessment: Needs assistance   Sitting balance-Leahy Scale: Good       Standing balance-Leahy Scale: Fair Standing balance comment: able to stand in bathroom and perform perineal hygiene after toileting with S                             Pertinent Vitals/Pain Pain Assessment: Faces Faces Pain Scale: Hurts whole lot Pain Location: both shoulders Pain Descriptors / Indicators: Aching Pain Intervention(s): Monitored during session;Repositioned;Heat applied    Home Living Family/patient expects to be discharged to:: Private residence Living Arrangements: Alone   Type of Home: Apartment Home Access: Stairs to enter Entrance Stairs-Rails: Right Entrance Stairs-Number of Steps: flight Home Layout: One level Home Equipment: Environmental consultant - 2 wheels      Prior Function Level of Independence: Independent with assistive device(s)         Comments: reports takes the bus or son drives him     Hand Dominance        Extremity/Trunk Assessment   Upper Extremity Assessment Upper Extremity Assessment: Generalized weakness    Lower Extremity Assessment Lower Extremity Assessment: Generalized weakness    Cervical / Trunk Assessment Cervical / Trunk Assessment: Kyphotic  Communication   Communication: No difficulties  Cognition Arousal/Alertness: Awake/alert Behavior During Therapy: WFL for tasks assessed/performed Overall Cognitive Status: Within Functional Limits for tasks assessed  General Comments      Exercises     Assessment/Plan    PT Assessment Patient needs continued PT services  PT Problem List Decreased mobility;Decreased activity tolerance;Decreased knowledge of use of DME;Decreased strength       PT Treatment Interventions DME instruction;Stair training;Therapeutic activities;Balance training;Functional mobility training;Gait training;Patient/family education    PT Goals (Current  goals can be found in the Care Plan section)  Acute Rehab PT Goals Patient Stated Goal: none stated PT Goal Formulation: With patient Time For Goal Achievement: 04/19/19 Potential to Achieve Goals: Good    Frequency Min 3X/week   Barriers to discharge Decreased caregiver support lives alone, second story apartment    Co-evaluation               AM-PAC PT "6 Clicks" Mobility  Outcome Measure Help needed turning from your back to your side while in a flat bed without using bedrails?: None Help needed moving from lying on your back to sitting on the side of a flat bed without using bedrails?: A Little Help needed moving to and from a bed to a chair (including a wheelchair)?: A Little Help needed standing up from a chair using your arms (e.g., wheelchair or bedside chair)?: A Little Help needed to walk in hospital room?: A Little Help needed climbing 3-5 steps with a railing? : A Little 6 Click Score: 19    End of Session   Activity Tolerance: Patient tolerated treatment well Patient left: in chair;with call bell/phone within reach;with chair alarm set;with nursing/sitter in room   PT Visit Diagnosis: Other abnormalities of gait and mobility (R26.89);Muscle weakness (generalized) (M62.81)    Time: 1007-1030 PT Time Calculation (min) (ACUTE ONLY): 23 min   Charges:   PT Evaluation $PT Eval Moderate Complexity: 1 Mod PT Treatments $Gait Training: 8-22 mins        Magda Kiel, [place] [facility] [phone] 04/05/2019   Reginia Naas 04/05/2019, 1:01 PM

## 2019-04-05 NOTE — Progress Notes (Signed)
PROGRESS NOTE    Todd [name].  [id number] DOB: [birth year] DOA: 04/03/2019 PCP: [name], MD    Brief Narrative:  63 year old African-American male with history of alcohol abuse, illicit drug use, obesity, OSA, PE, DVT, hep C, prostate cancer and hypertension hyperlipidemia and depression found outside his home unresponsive and hypoxic with intoxication.  In the emergency room, he was found with potassium 3.2, BUN/creatinine, 12/2.19, lactic acid 2.1.  Skeletal survey negative.  He was found with SVT, acute renal failure and altered mentation and was admitted to the hospital.   Assessment & Plan:   Active Problems:   AMS (altered mental status)  Altered mental status: Acute metabolic encephalopathy from alcohol intoxication and polysubstance abuse.  No focal deficits.  UDS positive for cocaine and opiates.  Currently mentation improved.  SVT and sinus tachycardia: Aggravated by substance abuse.  Initially treated with Cardizem infusion and currently off.  Seen by cardiology.  Now sinus rhythm and rate controlled.  Mildly elevated troponins.  Currently on metoprolol and tolerating well.  Metabolic acidosis with acute renal failure: Suspect ATN versus prerenal.  Renal ultrasound was normal.  Treated with IV fluids with improvement.  Continue IV fluids today because of significant renal failure on presentation and is still not having good appetite.  Alcohol abuse and polysubstance abuse: Counseled extensively.  On CIWA scale for withdrawal protocol.  On thiamine and multivitamins.  Shoulder pain, chest pain: Musculoskeletal pain.  Tramadol as needed.  Hypokalemia: Replace and monitor levels.  Elevated liver enzymes: Probably chronic liver disease with acute elevation of liver enzymes from drug use.  Treated with IV fluids.  Stabilizing.   DVT prophylaxis: Heparin subcu Code Status: Full code Family Communication: None Disposition Plan: Inpatient.  Advance activities.   Incentive spirometry.  Continue telemetry monitoring.  Anticipate discharge home tomorrow if remains a stable.   Consultants:   Cardiology  Procedures:   None  Antimicrobials:   None   Subjective: Patient seen and examined.  No overnight events.  He did not get good sleep.  Complains of pain all over the chest wall, left and right shoulder due to bursitis.  He says oxycodone is not helping.  Has some cough and that exacerbates pain.  Remains afebrile.  Denies any nausea vomiting.  Objective: Vitals:   04/04/19 1926 04/04/19 2326 04/05/19 0317 04/05/19 0752  BP: (!) 158/98 135/85 (!) 153/82 (!) 168/95  Pulse: 77 73 77 71  Resp: 15 18 (!) 32 18  Temp: 98.2 F (36.8 C) 98.1 F (36.7 C) 97.8 F (36.6 C) 97.9 F (36.6 C)  TempSrc: Oral Oral Oral Axillary  SpO2: 95% 90% 93% 94%  Weight:      Height:        Intake/Output Summary (Last 24 hours) at 04/05/2019 0953 Last data filed at 04/05/2019 0645 Gross per 24 hour  Intake 300 ml  Output 3025 ml  Net -2725 ml   Filed Weights   04/03/19 2225  Weight: 105.8 kg    Examination:  General exam: Appears calm and comfortable, on minimal oxygen.  Sleepy. Respiratory system: Clear to auscultation. Respiratory effort normal.  Palpable tenderness along the chest wall and shoulder area.  No swelling or redness. Cardiovascular system: S1 & S2 heard, RRR. No JVD, murmurs, rubs, gallops or clicks. No pedal edema. Gastrointestinal system: Abdomen is nondistended, soft and nontender. No organomegaly or masses felt. Normal bowel sounds heard.  Obese and pendulous. Central nervous system: Alert and oriented. No focal neurological deficits. Extremities:  Symmetric 5 x 5 power. Skin: No rashes, lesions or ulcers Psychiatry: Judgement and insight appear normal. Mood & affect appropriate.     Data Reviewed: I have personally reviewed following labs and imaging studies  CBC: Recent Labs  Lab 04/03/19 1422 04/03/19 1526 04/04/19 0057    WBC 5.3  --  7.5  NEUTROABS 2.4  --   --   HGB 15.5 15.6 14.3  HCT 48.8 46.0 44.3  MCV 101.7*  --  100.9*  PLT 224  --  387   Basic Metabolic Panel: Recent Labs  Lab 04/03/19 1422 04/03/19 1526 04/04/19 0057 04/05/19 0538  NA 140 142 138   138 141  K 3.9 3.2* 3.9   4.1 3.3*  CL 106  --  112*   113* 113*  CO2 14*  --  15*   14* 20*  GLUCOSE 238*  --  126*   124* 114*  BUN 12  --  12   12 6*  CREATININE 2.19*  --  2.29*   2.29* 1.03  CALCIUM 9.2  --  8.1*   8.2* 8.5*  MG  --   --  1.8  --   PHOS  --   --  5.2*  --    GFR: Estimated Creatinine Clearance: 87.9 mL/min (by C-G formula based on SCr of 1.03 mg/dL). Liver Function Tests: Recent Labs  Lab 04/03/19 1422 04/05/19 0538  AST 391* 65*  ALT 100* 50*  ALKPHOS 89 61  BILITOT 0.8 0.6  PROT 7.8 6.1*  ALBUMIN 4.2 3.0*   No results for input(s): LIPASE, AMYLASE in the last 168 hours. Recent Labs  Lab 04/03/19 1519  AMMONIA 57*   Coagulation Profile: Recent Labs  Lab 04/03/19 1519  INR 1.2   Cardiac Enzymes: Recent Labs  Lab 04/03/19 1422  CKTOTAL 141   BNP (last 3 results) No results for input(s): PROBNP in the last 8760 hours. HbA1C: No results for input(s): HGBA1C in the last 72 hours. CBG: Recent Labs  Lab 04/03/19 1413 04/03/19 2226 04/04/19 0633  GLUCAP 226* 102* 116*   Lipid Profile: No results for input(s): CHOL, HDL, LDLCALC, TRIG, CHOLHDL, LDLDIRECT in the last 72 hours. Thyroid Function Tests: Recent Labs    04/03/19 1519  TSH 2.322   Anemia Panel: No results for input(s): VITAMINB12, FOLATE, FERRITIN, TIBC, IRON, RETICCTPCT in the last 72 hours. Sepsis Labs: Recent Labs  Lab 04/03/19 1519 04/03/19 1951 04/03/19 2230  LATICACIDVEN 4.2* 2.1* 1.7    Recent Results (from the past 240 hour(s))  SARS Coronavirus 2 [facility] order, Performed in [facility] hospital lab) Nasopharyngeal Nasopharyngeal Swab     Status: None   Collection Time: 04/03/19  2:32 PM   Specimen:  Nasopharyngeal Swab  Result Value Ref Range Status   SARS Coronavirus 2 NEGATIVE NEGATIVE Final    Comment: (NOTE) If result is NEGATIVE SARS-CoV-2 target nucleic acids are NOT DETECTED. The SARS-CoV-2 RNA is generally detectable in upper and lower  respiratory specimens during the acute phase of infection. The lowest  concentration of SARS-CoV-2 viral copies this assay can detect is 250  copies / mL. A negative result does not preclude SARS-CoV-2 infection  and should not be used as the sole basis for treatment or other  patient management decisions.  A negative result may occur with  improper specimen collection / handling, submission of specimen other  than nasopharyngeal swab, presence of viral mutation(s) within the  areas targeted by this assay, and inadequate number  of viral copies  (<250 copies / mL). A negative result must be combined with clinical  observations, patient history, and epidemiological information. If result is POSITIVE SARS-CoV-2 target nucleic acids are DETECTED. The SARS-CoV-2 RNA is generally detectable in upper and lower  respiratory specimens dur ing the acute phase of infection.  Positive  results are indicative of active infection with SARS-CoV-2.  Clinical  correlation with patient history and other diagnostic information is  necessary to determine patient infection status.  Positive results do  not rule out bacterial infection or co-infection with other viruses. If result is PRESUMPTIVE POSTIVE SARS-CoV-2 nucleic acids MAY BE PRESENT.   A presumptive positive result was obtained on the submitted specimen  and confirmed on repeat testing.  While 2019 novel coronavirus  (SARS-CoV-2) nucleic acids may be present in the submitted sample  additional confirmatory testing may be necessary for epidemiological  and / or clinical management purposes  to differentiate between  SARS-CoV-2 and other Sarbecovirus currently known to infect humans.  If clinically  indicated additional testing with an alternate test  methodology [phone]) is advised. The SARS-CoV-2 RNA is generally  detectable in upper and lower respiratory sp ecimens during the acute  phase of infection. The expected result is Negative. Fact Sheet for Patients:  StrictlyIdeas.no Fact Sheet for Healthcare Providers: BankingDealers.co.za This test is not yet approved or cleared by the [place] FDA and has been authorized for detection and/or diagnosis of SARS-CoV-2 by FDA under an Emergency Use Authorization (EUA).  This EUA will remain in effect (meaning this test can be used) for the duration of the COVID-19 declaration under Section 564(b)(1) of the Act, 21 U.S.C. section 360bbb-3(b)(1), unless the authorization is terminated or revoked sooner. Performed at [facility], [place] [address] [place], [place] [address]          Radiology Studies: Ct Head Wo Contrast  Result Date: 04/03/2019 CLINICAL DATA:  Found on ground.  Minor head trauma.  ETOH. EXAM: CT HEAD WITHOUT CONTRAST CT CERVICAL SPINE WITHOUT CONTRAST TECHNIQUE: Multidetector CT imaging of the head and cervical spine was performed following the standard protocol without intravenous contrast. Multiplanar CT image reconstructions of the cervical spine were also generated. COMPARISON:  CT head 04/13/2018 FINDINGS: CT HEAD FINDINGS Brain: Generalized atrophy. Patchy white matter hypodensity bilaterally similar to the prior study. Negative for acute infarct, hemorrhage, mass. Vascular: Negative for hyperdense vessel Skull: Negative Sinuses/Orbits: Chronic fracture left medial orbit. Mild mucosal edema paranasal sinuses. No orbital lesion. Other: None CT CERVICAL SPINE FINDINGS Alignment: Normal alignment.  Moderate cervical kyphosis. Skull base and vertebrae: Negative for fracture Soft tissues and spinal canal: Negative Disc levels: Disc degeneration and spurring most  prominent C5-6 and C6-7 causing mild foraminal narrowing bilaterally. Upper chest: Negative Other: None IMPRESSION: 1. No acute intracranial abnormality. Atrophy and white matter changes consistent with chronic microvascular ischemia 2. Negative for cervical spine fracture. Cervical spondylosis and kyphosis. Electronically Signed   By: Franchot Gallo M.D.   On: 04/03/2019 18:29   Ct Cervical Spine Wo Contrast  Result Date: 04/03/2019 CLINICAL DATA:  Found on ground.  Minor head trauma.  ETOH. EXAM: CT HEAD WITHOUT CONTRAST CT CERVICAL SPINE WITHOUT CONTRAST TECHNIQUE: Multidetector CT imaging of the head and cervical spine was performed following the standard protocol without intravenous contrast. Multiplanar CT image reconstructions of the cervical spine were also generated. COMPARISON:  CT head 04/13/2018 FINDINGS: CT HEAD FINDINGS Brain: Generalized atrophy. Patchy white matter hypodensity bilaterally similar to the  prior study. Negative for acute infarct, hemorrhage, mass. Vascular: Negative for hyperdense vessel Skull: Negative Sinuses/Orbits: Chronic fracture left medial orbit. Mild mucosal edema paranasal sinuses. No orbital lesion. Other: None CT CERVICAL SPINE FINDINGS Alignment: Normal alignment.  Moderate cervical kyphosis. Skull base and vertebrae: Negative for fracture Soft tissues and spinal canal: Negative Disc levels: Disc degeneration and spurring most prominent C5-6 and C6-7 causing mild foraminal narrowing bilaterally. Upper chest: Negative Other: None IMPRESSION: 1. No acute intracranial abnormality. Atrophy and white matter changes consistent with chronic microvascular ischemia 2. Negative for cervical spine fracture. Cervical spondylosis and kyphosis. Electronically Signed   By: Franchot Gallo M.D.   On: 04/03/2019 18:29   US Renal  Result Date: 04/04/2019 CLINICAL DATA:  Acute kidney injury. EXAM: RENAL / URINARY TRACT ULTRASOUND COMPLETE COMPARISON:  CT of the abdomen and pelvis  01/09/2019 FINDINGS: Right Kidney: Renal measurements: 10.3 x 5.4 x 6.0 cm = volume: 176 mL. Renal parenchyma is isoechoic to the index organ, the liver. A simple cyst measures 1.9 cm in the interpolar region of the kidney anteriorly. Left Kidney: Renal measurements: 0.9 x 5.9 x 5.8 = volume: 230 mL. Two cysts or a single septated cyst is present near the lower pole of the left kidney. These otherwise appear benign. A simple cyst is present at the upper pole. Maximum dimension of any cyst is 1.5 cm. The renal parenchyma is isoechoic to the index organ, the spleen. Bladder: Empty IMPRESSION: 1. Renal parenchyma is mildly hyperechoic bilaterally. This is nonspecific, but can be seen in the setting of medical renal disease. 2. Bilateral renal cysts. 3. No solid mass lesion or obstructive disease in either kidney to explain acute kidney injury otherwise. Electronically Signed   By: San Morelle M.D.   On: 04/04/2019 05:54   Dg Chest Port 1 View  Result Date: 04/03/2019 CLINICAL DATA:  63 year old male with mental status change found down outside his apartment. EXAM: PORTABLE CHEST 1 VIEW COMPARISON:  Prior chest x-ray 01/16/2019 FINDINGS: External defibrillator pads project over the left chest. The cardiac and mediastinal contours remain unchanged. Trace atherosclerotic calcifications can be seen in the transverse aorta. Stable background bronchitic changes and mild interstitial prominence. No edema, airspace opacity, pleural effusion or pneumothorax. Inspiratory volumes are slightly low. Osseous structures are intact and without evidence of acute injury. Chronic degenerative changes of the right shoulder. IMPRESSION: Stable chest x-ray without evidence of acute cardiopulmonary process. Electronically Signed   By: Jacqulynn Cadet M.D.   On: 04/03/2019 16:07        Scheduled Meds:  atorvastatin  10 mg Oral C6237   folic acid  1 mg Oral Daily   heparin  5,000 Units Subcutaneous Q8H    metoprolol tartrate  25 mg Oral BID   multivitamin with minerals  1 tablet Oral Q breakfast   potassium chloride  40 mEq Oral BID   sertraline  100 mg Oral Daily   Tdap  0.5 mL Intramuscular Once   thiamine  100 mg Oral Daily   Or   thiamine  100 mg Intravenous Daily   traZODone  100 mg Oral QHS   Continuous Infusions:  sodium chloride 125 mL/hr at 04/04/19 1730   naLOXone (NARCAN) adult infusion for OVERDOSE Stopped (04/03/19 1559)     LOS: 2 days    Time spent: 25 minutes    Barb Merino, MD [facility] Pager [phone]  If 7PM-7AM, please contact night-coverage [email or web address] Password [email or web address] 04/05/2019, 9:53 AM

## 2019-04-06 LAB — COMPREHENSIVE METABOLIC PANEL
ALT: 50 U/L — ABNORMAL HIGH (ref 0–44)
AST: 52 U/L — ABNORMAL HIGH (ref 15–41)
Albumin: 3.5 g/dL (ref 3.5–5.0)
Alkaline Phosphatase: 63 U/L (ref 38–126)
Anion gap: 12 (ref 5–15)
BUN: 5 mg/dL — ABNORMAL LOW (ref 8–23)
CO2: 23 mmol/L (ref 22–32)
Calcium: 9 mg/dL (ref 8.9–10.3)
Chloride: 104 mmol/L (ref 98–111)
Creatinine, Ser: 0.89 mg/dL (ref 0.61–1.24)
GFR calc Af Amer: >60 mL/min (ref >60)
GFR calc non Af Amer: >60 mL/min (ref >60)
Glucose, Bld: 126 mg/dL — ABNORMAL HIGH (ref 70–99)
Potassium: 3.1 mmol/L — ABNORMAL LOW (ref 3.5–5.1)
Sodium: 139 mmol/L (ref 135–145)
Total Bilirubin: 1.1 mg/dL (ref 0.3–1.2)
Total Protein: 6.9 g/dL (ref 6.5–8.1)

## 2019-04-06 MED ORDER — FAMOTIDINE 20 MG PO TABS: 20.0000 mg | ORAL_TABLET | Freq: Two times a day (BID) | ORAL | Status: DC

## 2019-04-06 MED ORDER — LOPERAMIDE HCL 2 MG PO CAPS: 4.0000 mg | ORAL_CAPSULE | Freq: Four times a day (QID) | ORAL | 0 refills | Status: DC | PRN

## 2019-04-06 MED ORDER — LOSARTAN POTASSIUM 100 MG PO TABS: 100.0000 mg | ORAL_TABLET | Freq: Every day | ORAL | 0 refills | Status: DC

## 2019-04-06 MED ORDER — LOSARTAN POTASSIUM 50 MG PO TABS
100.0000 mg | ORAL_TABLET | Freq: Every day | ORAL | Status: DC
  Administered 2019-04-06: 100 mg via ORAL

## 2019-04-06 MED ORDER — LOPERAMIDE HCL 2 MG PO CAPS
4.0000 mg | ORAL_CAPSULE | Freq: Four times a day (QID) | ORAL | Status: DC | PRN
  Administered 2019-04-06: 4 mg via ORAL
  Filled 2019-04-06: qty 2

## 2019-04-06 MED ORDER — LOSARTAN POTASSIUM 50 MG PO TABS
50.0000 mg | ORAL_TABLET | Freq: Every day | ORAL | Status: DC
  Filled 2019-04-06: qty 1

## 2019-04-06 MED ORDER — OXYCODONE HCL 5 MG PO TABS
10.0000 mg | ORAL_TABLET | Freq: Three times a day (TID) | ORAL | Status: DC | PRN
  Administered 2019-04-06: 10 mg via ORAL
  Filled 2019-04-06: qty 2

## 2019-04-06 MED ORDER — POTASSIUM CHLORIDE CRYS ER 20 MEQ PO TBCR
40.0000 meq | EXTENDED_RELEASE_TABLET | Freq: Two times a day (BID) | ORAL | Status: DC
  Administered 2019-04-06: 40 meq via ORAL
  Filled 2019-04-06: qty 2

## 2019-04-06 MED ORDER — OMEPRAZOLE 20 MG PO CPDR: 20.0000 mg | DELAYED_RELEASE_CAPSULE | Freq: Two times a day (BID) | ORAL | 0 refills | Status: DC

## 2019-04-06 MED ORDER — ATORVASTATIN CALCIUM 10 MG PO TABS: 10.0000 mg | ORAL_TABLET | Freq: Every day | ORAL | 0 refills | Status: DC

## 2019-04-06 MED ORDER — POTASSIUM CHLORIDE ER 10 MEQ PO TBCR: 10.0000 meq | EXTENDED_RELEASE_TABLET | Freq: Every day | ORAL | 0 refills | Status: DC

## 2019-04-06 MED ORDER — METOPROLOL TARTRATE 25 MG PO TABS: 25.0000 mg | ORAL_TABLET | Freq: Two times a day (BID) | ORAL | 0 refills | Status: DC

## 2019-04-06 NOTE — Progress Notes (Signed)
PT Cancellation Note  Patient Details Name: Todd [name]. MRN: [id number] DOB: July 02, 1956   Cancelled Treatment:    Reason Eval/Treat Not Completed: Patient declined, no reason specified; attempted again, but pt eager for d/c home.  Discussed stairs to enter apartment, reports he feels he can traverse.  Educated to plan up sideways with both hands on one rail for safety and encouraged him to call his friend to supervise him.  Continued to refuse to practice.  Will attempt another day if not d/c.    Reginia Naas 04/06/2019, 2:37 PM  Magda Kiel, [place] [phone] 04/06/2019

## 2019-04-06 NOTE — TOC Progression Note (Addendum)
Transition of Care (TOC) - Progression Note    Patient Details  Name: Todd [name]. MRN: [id number] Date of Birth: 1956/03/10  Transition of Care [facility]) CM/SW Contact  Pollie Friar, RN Phone Number: 04/06/2019, 2:05 PM  Clinical Narrative:    Recommendations are for [facility] services. MD feels patient doesn't need HH PT.  TOC following for further d/c needs.    Expected Discharge Plan: [place] Barriers to Discharge: Continued Medical Work up  Expected Discharge Plan and Services    Discharge Planning Services: CM Consult                                        Social Determinants of Health (SDOH) Interventions    Readmission Risk Interventions No flowsheet data found.

## 2019-04-06 NOTE — Progress Notes (Signed)
PT Cancellation Note  Patient Details Name: Todd [name]. MRN: [id number] DOB: Dec 26, 1955   Cancelled Treatment:    Reason Eval/Treat Not Completed: Medical issues which prohibited therapy; patient c/o stomach pain and diarrhea.  Will attempt again later.   Reginia Naas 04/06/2019, 9:49 AM  Miguel Barrera [phone] 04/06/2019

## 2019-04-06 NOTE — Care Management Important Message (Signed)
Important Message  Patient Details  Name: Todd [name]. MRN: [id number] Date of Birth: 05-15-1956   Medicare Important Message Given:  Yes     Carrol Hougland 04/06/2019, 4:08 PM

## 2019-04-06 NOTE — TOC Transition Note (Signed)
Transition of Care [facility]) - CM/SW Discharge Note   Patient Details  Name: Todd [name]. MRN: [id number] Date of Birth: [birth year]  Transition of Care [facility]) CM/SW Contact:  Pollie Friar, RN Phone Number: 04/06/2019, 3:43 PM   Clinical Narrative:    Pt discharging home with self care. Pt has PCP, insurance and transportation home.   Final next level of care: Home/Self Care Barriers to Discharge: No Barriers Identified   Patient Goals and CMS Choice   CMS Medicare.gov Compare Post Acute Care list provided to:: Patient Choice offered to / list presented to : Patient  Discharge Placement                       Discharge Plan and Services   Discharge Planning Services: CM Consult Post Acute Care Choice: [place]: [place] ([facility]) Date [facility] [employer] Agency Contacted: 04/06/19      Social Determinants of Health (SDOH) Interventions     Readmission Risk Interventions No flowsheet data found.

## 2019-04-06 NOTE — Discharge Summary (Signed)
Physician Discharge Summary  [name] [id number] DOB: 07-Oct-1955 DOA: 04/03/2019  PCP: [name], MD  Admit date: 04/03/2019 Discharge date: 04/06/2019  Admitted From: home  Disposition:  Home   Recommendations for Outpatient Follow-up:  1. Follow up with PCP in 1-2 weeks 2. Follow-up with your pain management specialist for prescriptions needed.  Home Health: Not applicable Equipment/Devices: Not applicable  Discharge Condition: Stable CODE STATUS: Full code Diet recommendation: Low carbohydrate diet.  Do not drink alcohol.  Brief/Interim Summary: 63 year old African-American male with history of alcohol abuse, illicit drug use, obesity, history of PE DVT and hepatitis C, hypertension hyperlipidemia and type 2 diabetes who was found outside his home unresponsive, hypoxic with intoxication.  In the emergency room, he was found with potassium of 3.2, BUN/creatinine of 12/2.19, lactic acid 2.1.  A skeletal survey was negative.  He was also found with SVT, acute renal failure and altered mental status and was admitted and treated in the hospital.  Discharge Diagnoses:  Active Problems:   AMS (altered mental status)  Altered mental status was thought to be from alcohol intoxication and polysubstance abuse.  No focal deficit.  Completely resolved.  Back to baseline.  No evidence of alcohol withdrawal.  Counseled extensively to avoid alcoholism and drug use.  SVT and sinus tachycardia, initially treated with Cardizem drip and now remains on metoprolol that will be discharged.  Metabolic acidosis and renal failure improved.  Electrolyte abnormalities were corrected.  Patient has some diarrhea and epigastric pain on the day of discharge.  He wanted to go home.  Since patient is otherwise medically stable I advised him to take Imodium over-the-counter, he does have significant epigastric discomfort and he will benefit with PPI, will prescribe Prilosec once a day.  Discharge  Instructions  Discharge Instructions    Diet - low sodium heart healthy   Complete by: As directed    Discharge instructions   Complete by: As directed    Take imodium as needed   Increase activity slowly   Complete by: As directed      Allergies as of 04/06/2019   No Known Allergies     Medication List    STOP taking these medications   DULoxetine 60 MG capsule Commonly known as: CYMBALTA   Eliquis DVT/PE Starter Pack 5 MG Tabs     TAKE these medications   atorvastatin 10 MG tablet Commonly known as: LIPITOR Take 1 tablet (10 mg total) by mouth daily at 6 PM.   Centrum Silver 50+Men Tabs Take 1 tablet by mouth daily with breakfast.   loperamide 2 MG capsule Commonly known as: IMODIUM Take 2 capsules (4 mg total) by mouth every 6 (six) hours as needed for diarrhea or loose stools.   losartan 100 MG tablet Commonly known as: COZAAR Take 1 tablet (100 mg total) by mouth daily. Start taking on: April 07, 2019 What changed:   medication strength  how much to take   metFORMIN 1000 MG tablet Commonly known as: GLUCOPHAGE Take 1,000 mg by mouth 2 (two) times daily with a meal.   metoprolol tartrate 25 MG tablet Commonly known as: LOPRESSOR Take 1 tablet (25 mg total) by mouth 2 (two) times daily.   omeprazole 20 MG capsule Commonly known as: PriLOSEC Take 1 capsule (20 mg total) by mouth 2 (two) times daily before a meal.   potassium chloride 10 MEQ tablet Commonly known as: K-DUR Take 1 tablet (10 mEq total) by mouth daily for 7 days.  sertraline 100 MG tablet Commonly known as: ZOLOFT Take 100 mg by mouth every morning.   traZODone 100 MG tablet Commonly known as: DESYREL Take 1 tablet (100 mg total) by mouth at bedtime.   venlafaxine 100 MG tablet Commonly known as: EFFEXOR Take 100 mg by mouth 2 (two) times daily.      Follow-up Information    [facility] Follow up.   Why: The home health agency will contact you for the first home  visit Contact information: [phone]         No Known Allergies  Consultations:  Cardiology   Procedures/Studies: Ct Head Wo Contrast  Result Date: 04/03/2019 CLINICAL DATA:  Found on ground.  Minor head trauma.  ETOH. EXAM: CT HEAD WITHOUT CONTRAST CT CERVICAL SPINE WITHOUT CONTRAST TECHNIQUE: Multidetector CT imaging of the head and cervical spine was performed following the standard protocol without intravenous contrast. Multiplanar CT image reconstructions of the cervical spine were also generated. COMPARISON:  CT head 04/13/2018 FINDINGS: CT HEAD FINDINGS Brain: Generalized atrophy. Patchy white matter hypodensity bilaterally similar to the prior study. Negative for acute infarct, hemorrhage, mass. Vascular: Negative for hyperdense vessel Skull: Negative Sinuses/Orbits: Chronic fracture left medial orbit. Mild mucosal edema paranasal sinuses. No orbital lesion. Other: None CT CERVICAL SPINE FINDINGS Alignment: Normal alignment.  Moderate cervical kyphosis. Skull base and vertebrae: Negative for fracture Soft tissues and spinal canal: Negative Disc levels: Disc degeneration and spurring most prominent C5-6 and C6-7 causing mild foraminal narrowing bilaterally. Upper chest: Negative Other: None IMPRESSION: 1. No acute intracranial abnormality. Atrophy and white matter changes consistent with chronic microvascular ischemia 2. Negative for cervical spine fracture. Cervical spondylosis and kyphosis. Electronically Signed   By: Franchot Gallo M.D.   On: 04/03/2019 18:29   Ct Cervical Spine Wo Contrast  Result Date: 04/03/2019 CLINICAL DATA:  Found on ground.  Minor head trauma.  ETOH. EXAM: CT HEAD WITHOUT CONTRAST CT CERVICAL SPINE WITHOUT CONTRAST TECHNIQUE: Multidetector CT imaging of the head and cervical spine was performed following the standard protocol without intravenous contrast. Multiplanar CT image reconstructions of the cervical spine were also generated. COMPARISON:  CT head  04/13/2018 FINDINGS: CT HEAD FINDINGS Brain: Generalized atrophy. Patchy white matter hypodensity bilaterally similar to the prior study. Negative for acute infarct, hemorrhage, mass. Vascular: Negative for hyperdense vessel Skull: Negative Sinuses/Orbits: Chronic fracture left medial orbit. Mild mucosal edema paranasal sinuses. No orbital lesion. Other: None CT CERVICAL SPINE FINDINGS Alignment: Normal alignment.  Moderate cervical kyphosis. Skull base and vertebrae: Negative for fracture Soft tissues and spinal canal: Negative Disc levels: Disc degeneration and spurring most prominent C5-6 and C6-7 causing mild foraminal narrowing bilaterally. Upper chest: Negative Other: None IMPRESSION: 1. No acute intracranial abnormality. Atrophy and white matter changes consistent with chronic microvascular ischemia 2. Negative for cervical spine fracture. Cervical spondylosis and kyphosis. Electronically Signed   By: Franchot Gallo M.D.   On: 04/03/2019 18:29   US Renal  Result Date: 04/04/2019 CLINICAL DATA:  Acute kidney injury. EXAM: RENAL / URINARY TRACT ULTRASOUND COMPLETE COMPARISON:  CT of the abdomen and pelvis 01/09/2019 FINDINGS: Right Kidney: Renal measurements: 10.3 x 5.4 x 6.0 cm = volume: 176 mL. Renal parenchyma is isoechoic to the index organ, the liver. A simple cyst measures 1.9 cm in the interpolar region of the kidney anteriorly. Left Kidney: Renal measurements: 0.9 x 5.9 x 5.8 = volume: 230 mL. Two cysts or a single septated cyst is present near the lower pole of the left  kidney. These otherwise appear benign. A simple cyst is present at the upper pole. Maximum dimension of any cyst is 1.5 cm. The renal parenchyma is isoechoic to the index organ, the spleen. Bladder: Empty IMPRESSION: 1. Renal parenchyma is mildly hyperechoic bilaterally. This is nonspecific, but can be seen in the setting of medical renal disease. 2. Bilateral renal cysts. 3. No solid mass lesion or obstructive disease in either  kidney to explain acute kidney injury otherwise. Electronically Signed   By: San Morelle M.D.   On: 04/04/2019 05:54   Dg Chest Port 1 View  Result Date: 04/03/2019 CLINICAL DATA:  63 year old male with mental status change found down outside his apartment. EXAM: PORTABLE CHEST 1 VIEW COMPARISON:  Prior chest x-ray 01/16/2019 FINDINGS: External defibrillator pads project over the left chest. The cardiac and mediastinal contours remain unchanged. Trace atherosclerotic calcifications can be seen in the transverse aorta. Stable background bronchitic changes and mild interstitial prominence. No edema, airspace opacity, pleural effusion or pneumothorax. Inspiratory volumes are slightly low. Osseous structures are intact and without evidence of acute injury. Chronic degenerative changes of the right shoulder. IMPRESSION: Stable chest x-ray without evidence of acute cardiopulmonary process. Electronically Signed   By: Jacqulynn Cadet M.D.   On: 04/03/2019 16:07      Subjective: Patient was seen and examined.  Patient asked me to come talk to him in the afternoon about going home.  He said he has some diarrhea otherwise feels good and he can take some Imodium. Patient was asking for narcotic prescriptions, I was unable to prescribe because he has a pain management contract with his physician.   Discharge Exam: Vitals:   04/06/19 0700 04/06/19 1100  BP: (!) 190/112 (!) 183/108  Pulse: 68 63  Resp: 16 13  Temp: 97.7 F (36.5 C) 97.9 F (36.6 C)  SpO2: 98% 94%   Vitals:   04/05/19 2300 04/06/19 0342 04/06/19 0700 04/06/19 1100  BP: (!) 173/96 (!) 166/98 (!) 190/112 (!) 183/108  Pulse: 66 66 68 63  Resp: (!) 24 17 16 13   Temp: 98.4 F (36.9 C) 98.3 F (36.8 C) 97.7 F (36.5 C) 97.9 F (36.6 C)  TempSrc: Axillary Oral Axillary Axillary  SpO2: 94% 93% 98% 94%  Weight:      Height:        General: Pt is alert, awake, not in acute distress Cardiovascular: RRR, S1/S2 +, no rubs, no  gallops Respiratory: CTA bilaterally, no wheezing, no rhonchi Abdominal: Soft, NT, ND, bowel sounds + Extremities: no edema, no cyanosis    The results of significant diagnostics from this hospitalization (including imaging, microbiology, ancillary and laboratory) are listed below for reference.     Microbiology: Recent Results (from the past 240 hour(s))  SARS Coronavirus 2 [facility] order, Performed in [facility] hospital lab) Nasopharyngeal Nasopharyngeal Swab     Status: None   Collection Time: 04/03/19  2:32 PM   Specimen: Nasopharyngeal Swab  Result Value Ref Range Status   SARS Coronavirus 2 NEGATIVE NEGATIVE Final    Comment: (NOTE) If result is NEGATIVE SARS-CoV-2 target nucleic acids are NOT DETECTED. The SARS-CoV-2 RNA is generally detectable in upper and lower  respiratory specimens during the acute phase of infection. The lowest  concentration of SARS-CoV-2 viral copies this assay can detect is 250  copies / mL. A negative result does not preclude SARS-CoV-2 infection  and should not be used as the sole basis for treatment or other  patient management decisions.  A  negative result may occur with  improper specimen collection / handling, submission of specimen other  than nasopharyngeal swab, presence of viral mutation(s) within the  areas targeted by this assay, and inadequate number of viral copies  (<250 copies / mL). A negative result must be combined with clinical  observations, patient history, and epidemiological information. If result is POSITIVE SARS-CoV-2 target nucleic acids are DETECTED. The SARS-CoV-2 RNA is generally detectable in upper and lower  respiratory specimens dur ing the acute phase of infection.  Positive  results are indicative of active infection with SARS-CoV-2.  Clinical  correlation with patient history and other diagnostic information is  necessary to determine patient infection status.  Positive results do  not rule out bacterial  infection or co-infection with other viruses. If result is PRESUMPTIVE POSTIVE SARS-CoV-2 nucleic acids MAY BE PRESENT.   A presumptive positive result was obtained on the submitted specimen  and confirmed on repeat testing.  While 2019 novel coronavirus  (SARS-CoV-2) nucleic acids may be present in the submitted sample  additional confirmatory testing may be necessary for epidemiological  and / or clinical management purposes  to differentiate between  SARS-CoV-2 and other Sarbecovirus currently known to infect humans.  If clinically indicated additional testing with an alternate test  methodology [phone]) is advised. The SARS-CoV-2 RNA is generally  detectable in upper and lower respiratory sp ecimens during the acute  phase of infection. The expected result is Negative. Fact Sheet for Patients:  StrictlyIdeas.no Fact Sheet for Healthcare Providers: BankingDealers.co.za This test is not yet approved or cleared by the [place] FDA and has been authorized for detection and/or diagnosis of SARS-CoV-2 by FDA under an Emergency Use Authorization (EUA).  This EUA will remain in effect (meaning this test can be used) for the duration of the COVID-19 declaration under Section 564(b)(1) of the Act, 21 U.S.C. section 360bbb-3(b)(1), unless the authorization is terminated or revoked sooner. Performed at [facility], [place] [address], [place], [place] [address]      Labs: BNP (last 3 results) Recent Labs    07/13/18 2219 07/25/18 0021  BNP 146.6* 0000000*   Basic Metabolic Panel: Recent Labs  Lab 04/03/19 1422 04/03/19 1526 04/04/19 0057 04/05/19 0538 04/06/19 0501  NA 140 142 138  138 141 139  K 3.9 3.2* 3.9  4.1 3.3* 3.1*  CL 106  --  112*  113* 113* 104  CO2 14*  --  15*  14* 20* 23  GLUCOSE 238*  --  126*  124* 114* 126*  BUN 12  --  12  12 6* 5*  CREATININE 2.19*  --  2.29*  2.29* 1.03 0.89  CALCIUM 9.2  --   8.1*  8.2* 8.5* 9.0  MG  --   --  1.8  --   --   PHOS  --   --  5.2*  --   --    Liver Function Tests: Recent Labs  Lab 04/03/19 1422 04/05/19 0538 04/06/19 0501  AST 391* 65* 52*  ALT 100* 50* 50*  ALKPHOS 89 61 63  BILITOT 0.8 0.6 1.1  PROT 7.8 6.1* 6.9  ALBUMIN 4.2 3.0* 3.5   No results for input(s): LIPASE, AMYLASE in the last 168 hours. Recent Labs  Lab 04/03/19 1519  AMMONIA 57*   CBC: Recent Labs  Lab 04/03/19 1422 04/03/19 1526 04/04/19 0057  WBC 5.3  --  7.5  NEUTROABS 2.4  --   --   HGB 15.5 15.6  14.3  HCT 48.8 46.0 44.3  MCV 101.7*  --  100.9*  PLT 224  --  182   Cardiac Enzymes: Recent Labs  Lab 04/03/19 1422  CKTOTAL 141   BNP: Invalid input(s): POCBNP CBG: Recent Labs  Lab 04/03/19 1413 04/03/19 2226 04/04/19 0633  GLUCAP 226* 102* 116*   D-Dimer No results for input(s): DDIMER in the last 72 hours. Hgb A1c No results for input(s): HGBA1C in the last 72 hours. Lipid Profile No results for input(s): CHOL, HDL, LDLCALC, TRIG, CHOLHDL, LDLDIRECT in the last 72 hours. Thyroid function studies Recent Labs    04/03/19 1519  TSH 2.322   Anemia work up No results for input(s): VITAMINB12, FOLATE, FERRITIN, TIBC, IRON, RETICCTPCT in the last 72 hours. Urinalysis    Component Value Date/Time   COLORURINE YELLOW 04/03/2019 1722   APPEARANCEUR CLOUDY (A) 04/03/2019 1722   LABSPEC 1.010 04/03/2019 1722   PHURINE 6.0 04/03/2019 1722   GLUCOSEU 50 (A) 04/03/2019 1722   HGBUR SMALL (A) 04/03/2019 1722   BILIRUBINUR NEGATIVE 04/03/2019 1722   KETONESUR NEGATIVE 04/03/2019 1722   PROTEINUR 100 (A) 04/03/2019 1722   UROBILINOGEN 0.2 12/24/2009 1944   NITRITE NEGATIVE 04/03/2019 1722   LEUKOCYTESUR NEGATIVE 04/03/2019 1722   Sepsis Labs Invalid input(s): PROCALCITONIN,  WBC,  LACTICIDVEN Microbiology Recent Results (from the past 240 hour(s))  SARS Coronavirus 2 [facility] order, Performed in [facility] hospital lab) Nasopharyngeal  Nasopharyngeal Swab     Status: None   Collection Time: 04/03/19  2:32 PM   Specimen: Nasopharyngeal Swab  Result Value Ref Range Status   SARS Coronavirus 2 NEGATIVE NEGATIVE Final    Comment: (NOTE) If result is NEGATIVE SARS-CoV-2 target nucleic acids are NOT DETECTED. The SARS-CoV-2 RNA is generally detectable in upper and lower  respiratory specimens during the acute phase of infection. The lowest  concentration of SARS-CoV-2 viral copies this assay can detect is 250  copies / mL. A negative result does not preclude SARS-CoV-2 infection  and should not be used as the sole basis for treatment or other  patient management decisions.  A negative result may occur with  improper specimen collection / handling, submission of specimen other  than nasopharyngeal swab, presence of viral mutation(s) within the  areas targeted by this assay, and inadequate number of viral copies  (<250 copies / mL). A negative result must be combined with clinical  observations, patient history, and epidemiological information. If result is POSITIVE SARS-CoV-2 target nucleic acids are DETECTED. The SARS-CoV-2 RNA is generally detectable in upper and lower  respiratory specimens dur ing the acute phase of infection.  Positive  results are indicative of active infection with SARS-CoV-2.  Clinical  correlation with patient history and other diagnostic information is  necessary to determine patient infection status.  Positive results do  not rule out bacterial infection or co-infection with other viruses. If result is PRESUMPTIVE POSTIVE SARS-CoV-2 nucleic acids MAY BE PRESENT.   A presumptive positive result was obtained on the submitted specimen  and confirmed on repeat testing.  While 2019 novel coronavirus  (SARS-CoV-2) nucleic acids may be present in the submitted sample  additional confirmatory testing may be necessary for epidemiological  and / or clinical management purposes  to differentiate between   SARS-CoV-2 and other Sarbecovirus currently known to infect humans.  If clinically indicated additional testing with an alternate test  methodology [phone]) is advised. The SARS-CoV-2 RNA is generally  detectable in upper and lower respiratory sp ecimens during  the acute  phase of infection. The expected result is Negative. Fact Sheet for Patients:  StrictlyIdeas.no Fact Sheet for Healthcare Providers: BankingDealers.co.za This test is not yet approved or cleared by the [place] FDA and has been authorized for detection and/or diagnosis of SARS-CoV-2 by FDA under an Emergency Use Authorization (EUA).  This EUA will remain in effect (meaning this test can be used) for the duration of the COVID-19 declaration under Section 564(b)(1) of the Act, 21 U.S.C. section 360bbb-3(b)(1), unless the authorization is terminated or revoked sooner. Performed at [facility], [place] [address], [place], [place] [address]      Time coordinating discharge: 32 minutes  SIGNED:   Barb Merino, MD  [facility] 04/06/2019, 2:05 PM

## 2019-04-06 NOTE — Progress Notes (Signed)
Patient discharged home.  Discharge instructions given by SWOT RN. IV and telemetry discontinued. Patient requesting to be taken to front entrance and will call a cab from there.  Escorted to [employer]	[place] via wheelchair with Therapist, sports. Wendee Copp

## 2019-04-06 NOTE — Plan of Care (Signed)
  Problem: Acute Rehab PT Goals(only PT should resolve) Goal: Pt Will Go Supine/Side To Sit Outcome: Adequate for Discharge Goal: Patient Will Transfer Sit To/From Stand Outcome: Adequate for Discharge Goal: Pt Will Ambulate Outcome: Adequate for Discharge Goal: Pt Will Go Up/Down Stairs Outcome: Adequate for Discharge

## 2019-04-06 NOTE — Consult Note (Signed)
   [facility] CM Inpatient Consult   04/06/2019  Todd [name] 1956-07-05 [id number]   Patient reviewed for 22% high risk score for unplanned readmission and hospitalization under his Medicare/ NextGen plan; and to check if potential [place] Management services needed.     Per chart review and MD brief summary show as: 63 year old African-American male with history of alcohol abuse, illicit drug use, obesity, history of PE DVT and hepatitis C, hypertension hyperlipidemia and type 2 diabetes, who was found outside his home unresponsive, hypoxic with intoxication.  In the emergency room, he was found with potassium of 3.2, BUN/creatinine of 12/2.19, lactic acid 2.1.  A skeletal survey was negative.  He was also found with SVT, acute renal failure and altered mental status, and was admitted and treated in the hospital.    AMS (altered mental status)-completely resolved, back to baseline. No evidence of alcohol withdrawal. Counseled extensively to avoid alcoholism and drug use.  Patient transition to home prior to speaking with him.  Plan: Will follow with EMMI General calls post discharge.  Primary Care provider is Dr. [name] with [facility], listed as providing transition of care follow-up.  Please place a [facility] Care Management consult as appropriate and for questions, please contact:   Edwena Felty A. Kamie Korber, BSN, RN-BC [facility] Liaison Cell: [phone]

## 2019-04-07 DIAGNOSIS — K2950 Unspecified chronic gastritis without bleeding: Secondary | ICD-10-CM | POA: Insufficient documentation

## 2019-04-16 ENCOUNTER — Other Ambulatory Visit: Payer: Self-pay | Admitting: *Deleted

## 2019-04-16 NOTE — Patient Outreach (Signed)
Outreach call to Todd [name] for follow up on 3 [place] EMMI flags (no follow up appointment scheduled, lost interest in things, sad/ hopeless/ anxious),  No answer to telephone and no option to leave voicemail.  Rn CM mailed unsuccessful outreach letter to pt home.  PLAN Outreach pt in 3-4 business days  Jacqlyn Larsen [facility] [place] [phone]

## 2019-04-20 ENCOUNTER — Other Ambulatory Visit: Payer: Self-pay | Admitting: *Deleted

## 2019-04-20 NOTE — Patient Outreach (Signed)
Outreach call to pt / 2nd attempt for follow up on Red EMMI, no answer to telephone and no option to leave voicemail.  PLAN Outreach pt in 3-4 business days  Jacqlyn Larsen [facility], [place] [phone]

## 2019-04-26 ENCOUNTER — Other Ambulatory Visit: Payer: Self-pay | Admitting: *Deleted

## 2019-04-26 NOTE — Patient Outreach (Signed)
Outreach call to pt for follow up on Red EMMI/  3rd attempt, unidentified male answered the phone,  RN CM ask to speak with Todd [name] and unidentified male would not let RN CM speak with Mr. [place], phone call ended.  Pt has not responded to letter previously mailed to his home.  PLAN Close case next week  Jacqlyn Larsen [facility], BSN [place] Coordinator [phone]

## 2019-05-01 ENCOUNTER — Other Ambulatory Visit: Payer: Self-pay | Admitting: *Deleted

## 2019-06-18 ENCOUNTER — Other Ambulatory Visit: Payer: Self-pay

## 2019-06-18 ENCOUNTER — Encounter (HOSPITAL_COMMUNITY): Payer: Self-pay | Admitting: Emergency Medicine

## 2019-06-18 ENCOUNTER — Inpatient Hospital Stay (HOSPITAL_COMMUNITY)
Admission: EM | Admit: 2019-06-18 | Discharge: 2019-06-30 | DRG: 389 | Disposition: A | Discharge status: Home / Self Care | Payer: Medicare Other | Attending: Internal Medicine | Admitting: Internal Medicine

## 2019-06-18 ENCOUNTER — Emergency Department (HOSPITAL_COMMUNITY): Payer: Medicare Other

## 2019-06-18 DIAGNOSIS — K2210 Ulcer of esophagus without bleeding: Secondary | ICD-10-CM | POA: Diagnosis present

## 2019-06-18 DIAGNOSIS — Z9114 Patient's other noncompliance with medication regimen: Secondary | ICD-10-CM

## 2019-06-18 DIAGNOSIS — K2970 Gastritis, unspecified, without bleeding: Secondary | ICD-10-CM | POA: Diagnosis present

## 2019-06-18 DIAGNOSIS — R1013 Epigastric pain: Secondary | ICD-10-CM

## 2019-06-18 DIAGNOSIS — G4730 Sleep apnea, unspecified: Secondary | ICD-10-CM | POA: Diagnosis present

## 2019-06-18 DIAGNOSIS — Z7984 Long term (current) use of oral hypoglycemic drugs: Secondary | ICD-10-CM

## 2019-06-18 DIAGNOSIS — F1910 Other psychoactive substance abuse, uncomplicated: Secondary | ICD-10-CM | POA: Diagnosis present

## 2019-06-18 DIAGNOSIS — Z86711 Personal history of pulmonary embolism: Secondary | ICD-10-CM

## 2019-06-18 DIAGNOSIS — F1010 Alcohol abuse, uncomplicated: Secondary | ICD-10-CM | POA: Diagnosis present

## 2019-06-18 DIAGNOSIS — R4701 Aphasia: Secondary | ICD-10-CM | POA: Diagnosis present

## 2019-06-18 DIAGNOSIS — K449 Diaphragmatic hernia without obstruction or gangrene: Secondary | ICD-10-CM | POA: Diagnosis present

## 2019-06-18 DIAGNOSIS — F17210 Nicotine dependence, cigarettes, uncomplicated: Secondary | ICD-10-CM | POA: Diagnosis present

## 2019-06-18 DIAGNOSIS — Z8546 Personal history of malignant neoplasm of prostate: Secondary | ICD-10-CM

## 2019-06-18 DIAGNOSIS — E876 Hypokalemia: Secondary | ICD-10-CM | POA: Diagnosis not present

## 2019-06-18 DIAGNOSIS — Z86718 Personal history of other venous thrombosis and embolism: Secondary | ICD-10-CM

## 2019-06-18 DIAGNOSIS — I480 Paroxysmal atrial fibrillation: Secondary | ICD-10-CM | POA: Diagnosis present

## 2019-06-18 DIAGNOSIS — F332 Major depressive disorder, recurrent severe without psychotic features: Secondary | ICD-10-CM | POA: Diagnosis present

## 2019-06-18 DIAGNOSIS — E669 Obesity, unspecified: Secondary | ICD-10-CM | POA: Diagnosis present

## 2019-06-18 DIAGNOSIS — K30 Functional dyspepsia: Secondary | ICD-10-CM | POA: Diagnosis present

## 2019-06-18 DIAGNOSIS — E119 Type 2 diabetes mellitus without complications: Secondary | ICD-10-CM

## 2019-06-18 DIAGNOSIS — Z6834 Body mass index (BMI) 34.0-34.9, adult: Secondary | ICD-10-CM

## 2019-06-18 DIAGNOSIS — E7800 Pure hypercholesterolemia, unspecified: Secondary | ICD-10-CM | POA: Diagnosis present

## 2019-06-18 DIAGNOSIS — K56609 Unspecified intestinal obstruction, unspecified as to partial versus complete obstruction: Secondary | ICD-10-CM

## 2019-06-18 DIAGNOSIS — R0789 Other chest pain: Secondary | ICD-10-CM | POA: Diagnosis not present

## 2019-06-18 DIAGNOSIS — I10 Essential (primary) hypertension: Secondary | ICD-10-CM | POA: Diagnosis present

## 2019-06-18 DIAGNOSIS — Z8042 Family history of malignant neoplasm of prostate: Secondary | ICD-10-CM

## 2019-06-18 DIAGNOSIS — Z9049 Acquired absence of other specified parts of digestive tract: Secondary | ICD-10-CM

## 2019-06-18 DIAGNOSIS — Z9079 Acquired absence of other genital organ(s): Secondary | ICD-10-CM

## 2019-06-18 DIAGNOSIS — F1020 Alcohol dependence, uncomplicated: Secondary | ICD-10-CM | POA: Diagnosis present

## 2019-06-18 DIAGNOSIS — F1120 Opioid dependence, uncomplicated: Secondary | ICD-10-CM | POA: Diagnosis present

## 2019-06-18 DIAGNOSIS — K56600 Partial intestinal obstruction, unspecified as to cause: Secondary | ICD-10-CM | POA: Diagnosis not present

## 2019-06-18 DIAGNOSIS — F1410 Cocaine abuse, uncomplicated: Secondary | ICD-10-CM | POA: Diagnosis present

## 2019-06-18 DIAGNOSIS — Z79899 Other long term (current) drug therapy: Secondary | ICD-10-CM

## 2019-06-18 DIAGNOSIS — Z20828 Contact with and (suspected) exposure to other viral communicable diseases: Secondary | ICD-10-CM | POA: Diagnosis present

## 2019-06-18 DIAGNOSIS — I712 Thoracic aortic aneurysm, without rupture: Secondary | ICD-10-CM | POA: Diagnosis present

## 2019-06-18 LAB — CBC
HCT: 42.3 % (ref 39.0–52.0)
Hemoglobin: 13.9 g/dL (ref 13.0–17.0)
MCH: 31.2 pg (ref 26.0–34.0)
MCHC: 32.9 g/dL (ref 30.0–36.0)
MCV: 94.8 fL (ref 80.0–100.0)
Platelets: 349 10*3/uL (ref 150–400)
RBC: 4.46 MIL/uL (ref 4.22–5.81)
RDW: 14.1 % (ref 11.5–15.5)
WBC: 6.4 10*3/uL (ref 4.0–10.5)
nRBC: 0 % (ref 0.0–0.2)

## 2019-06-18 LAB — BASIC METABOLIC PANEL
Anion gap: 13 (ref 5–15)
BUN: 11 mg/dL (ref 8–23)
CO2: 21 mmol/L — ABNORMAL LOW (ref 22–32)
Calcium: 9.2 mg/dL (ref 8.9–10.3)
Chloride: 105 mmol/L (ref 98–111)
Creatinine, Ser: 1.04 mg/dL (ref 0.61–1.24)
GFR calc Af Amer: >60 mL/min (ref >60)
GFR calc non Af Amer: >60 mL/min (ref >60)
Glucose, Bld: 107 mg/dL — ABNORMAL HIGH (ref 70–99)
Potassium: 3.3 mmol/L — ABNORMAL LOW (ref 3.5–5.1)
Sodium: 139 mmol/L (ref 135–145)

## 2019-06-18 LAB — TROPONIN I (HIGH SENSITIVITY)
Troponin I (High Sensitivity): 16 ng/L (ref <18)
Troponin I (High Sensitivity): 13 ng/L (ref <18)

## 2019-06-18 MED ORDER — ALUM & MAG HYDROXIDE-SIMETH 200-200-20 MG/5ML PO SUSP
30.0000 mL | Freq: Once | ORAL | Status: AC
  Administered 2019-06-18: 30 mL via ORAL
  Filled 2019-06-18: qty 30

## 2019-06-18 MED ORDER — OXYCODONE-ACETAMINOPHEN 5-325 MG PO TABS
1.0000 | ORAL_TABLET | Freq: Once | ORAL | Status: AC
  Administered 2019-06-18: 1 via ORAL
  Filled 2019-06-18: qty 1

## 2019-06-18 MED ORDER — LIDOCAINE VISCOUS HCL 2 % MT SOLN
15.0000 mL | Freq: Once | OROMUCOSAL | Status: AC
  Administered 2019-06-18: 15 mL via ORAL
  Filled 2019-06-18: qty 15

## 2019-06-18 MED ORDER — ONDANSETRON HCL 4 MG/2ML IJ SOLN
4.0000 mg | Freq: Once | INTRAMUSCULAR | Status: AC
  Administered 2019-06-19: 4 mg via INTRAVENOUS
  Filled 2019-06-18: qty 2

## 2019-06-18 MED ORDER — LORAZEPAM 2 MG/ML IJ SOLN
1.0000 mg | Freq: Once | INTRAMUSCULAR | Status: AC
  Administered 2019-06-19: 1 mg via INTRAVENOUS
  Filled 2019-06-18: qty 1

## 2019-06-18 MED ORDER — SODIUM CHLORIDE 0.9 % IV BOLUS
500.0000 mL | Freq: Once | INTRAVENOUS | Status: AC
  Administered 2019-06-19: 500 mL via INTRAVENOUS

## 2019-06-18 MED ORDER — SODIUM CHLORIDE 0.9% FLUSH
3.0000 mL | Freq: Once | INTRAVENOUS | Status: AC
  Administered 2019-06-19: 3 mL via INTRAVENOUS

## 2019-06-18 NOTE — ED Provider Notes (Signed)
[facility] EMERGENCY DEPARTMENT Provider Note   CSN: [id number] Arrival date & time: 06/18/19  1850     History   Chief Complaint Chief Complaint  Patient presents with  . Chest Pain    HPI Todd [name]. is a 63 y.o. male.     HPI 63 year old male comes in a chief complaint of chest pain. Patient has history of DVT, diabetes, hypertension, heavy alcohol use. He reports that his been having epigastric chest pain for the last 2 weeks.  The pain is off and on, but he has daily occurrence of it.  Pain is described a sharp pain that is nonradiating.  He thinks that the pain is worse after he consumes alcohol.  Patient has nausea without vomiting.  No diarrhea.  Although history suggests chest pain, patient has mostly epigastric abdominal pain.  He does admit however to using cocaine and his last use was 2 or 3 days ago.  He also smokes about 4 to 5 cigarettes a day.  He has no known history of CAD.  Past Medical History:  Diagnosis Date  . Arthritis    "fingers" (07/14/2018)  . Depression   . DVT (deep venous thrombosis) (HCC) LLE  . Hepatitis C     finished harvoni tx ~ 2017  . Hypercholesterolemia   . Hypertension   . Prostate cancer ([address]) 55yrs ago  . Pulmonary embolism and infarction ([place]) 07/13/2018  . Sleep apnea    not currently using cpap, mask causing vertigo  . Type II diabetes mellitus [facility])     Patient Active Problem List   Diagnosis Date Noted  . AMS (altered mental status) 04/03/2019  . Acute kidney failure ([place]) 01/09/2019  . Pulmonary embolus and infarction ([place]) 07/13/2018  . Opioid use disorder, moderate, dependence ([place]) 06/29/2018  . Alcohol use disorder, severe, dependence ([place]) 06/27/2018  . MDD (major depressive disorder), recurrent severe, without psychosis ([place]) 06/26/2018  . Varicose veins of left lower extremity with complications 123456  . Benign paroxysmal positional vertigo 11/08/2013  . RBBB 06/02/2009  .  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 DM (diabetes mellitus), type 2 ([place]) 04/11/2009  . OBESITY 04/11/2009  . DEPRESSION 04/11/2009  . Essential hypertension 04/11/2009  . DIVERTICULAR DISEASE 04/11/2009  . CHOLECYSTITIS 04/11/2009  . CHEST PAIN 04/11/2009  . ABDOMINAL PAIN 04/11/2009    Past Surgical History:  Procedure Laterality Date  . BIOPSY  01/12/2019   Procedure: BIOPSY;  Surgeon: Ronald Lobo, MD;  Location: [facility];  Service: Endoscopy;;  . COLONOSCOPY WITH PROPOFOL N/A 02/19/2014   Procedure: COLONOSCOPY WITH PROPOFOL;  Surgeon: Garlan Fair, MD;  Location: [facility] ENDOSCOPY;  Service: Endoscopy;  Laterality: N/A;  . ENDOVENOUS ABLATION SAPHENOUS VEIN W/ LASER Left 11/22/2017   endovenous laser ablation L SSV by Tinnie Gens MD   . ESOPHAGOGASTRODUODENOSCOPY (EGD) WITH PROPOFOL N/A 01/12/2019   Procedure: ESOPHAGOGASTRODUODENOSCOPY (EGD) WITH PROPOFOL;  Surgeon: Ronald Lobo, MD;  Location: [facility] ENDOSCOPY;  Service: Endoscopy;  Laterality: N/A;  Patient is also scheduled for barium swallow; please notify radiology after patient's EGD is complete so that barium swallow follows the endoscopy, not vice versa  . PROSTATECTOMY  2008  . REPAIR QUADRICEPS / HAMSTRING MUSCLE Right         Home Medications    Prior to Admission medications   Medication Sig Start Date End Date Taking? Authorizing Provider  atorvastatin (LIPITOR) 10 MG tablet Take 1 tablet (10 mg total) by mouth daily at 6 PM. 04/06/19  05/06/19  Barb Merino, MD  loperamide (IMODIUM) 2 MG capsule Take 2 capsules (4 mg total) by mouth every 6 (six) hours as needed for diarrhea or loose stools. 04/06/19   Barb Merino, MD  losartan (COZAAR) 100 MG tablet Take 1 tablet (100 mg total) by mouth daily. 04/07/19 05/07/19  Barb Merino, MD  metFORMIN (GLUCOPHAGE) 1000 MG tablet Take 1,000 mg by mouth 2 (two) times daily with a meal.    [provider]  metoprolol tartrate (LOPRESSOR) 25 MG tablet Take 1  tablet (25 mg total) by mouth 2 (two) times daily. 04/06/19 05/06/19  Barb Merino, MD  Multiple Vitamins-Minerals (CENTRUM SILVER 50+MEN) TABS Take 1 tablet by mouth daily with breakfast.    [provider]  omeprazole (PRILOSEC) 20 MG capsule Take 1 capsule (20 mg total) by mouth 2 (two) times daily before a meal. 04/06/19 05/06/19  Barb Merino, MD  potassium chloride (K-DUR) 10 MEQ tablet Take 1 tablet (10 mEq total) by mouth daily for 7 days. 04/06/19 04/13/19  Barb Merino, MD  sertraline (ZOLOFT) 100 MG tablet Take 100 mg by mouth every morning.    [provider]  traZODone (DESYREL) 100 MG tablet Take 1 tablet (100 mg total) by mouth at bedtime. 07/04/18   Pennelope Bracken, MD  venlafaxine (EFFEXOR) 100 MG tablet Take 100 mg by mouth 2 (two) times daily.    [provider]    Family History Family History  Problem Relation Age of Onset  . Cancer Father        PROSTATE    Social History Social History   Tobacco Use  . Smoking status: Current Every Day Smoker    Packs/day: 0.12    Years: 45.00    Pack years: 5.40  . Smokeless tobacco: Never Used  Substance Use Topics  . Alcohol use: Yes    Comment: 07/14/2018 "40oz beer/day"  . 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   Review of Systems Review of Systems  Constitutional: Positive for activity change.  Respiratory: Negative for shortness of breath.   Cardiovascular: Negative for chest pain.  Gastrointestinal: Positive for abdominal pain and nausea.  Allergic/Immunologic: Negative for immunocompromised state.  Hematological: Does not bruise/bleed easily.  All other systems reviewed and are negative.    Physical Exam Updated Vital Signs BP 108/75 (BP Location: Right Arm)   Pulse 98   Temp 98.5 F (36.9 C) (Oral)   Resp 20   SpO2 98%   Physical Exam Vitals signs and nursing note reviewed.  Constitutional:       Appearance: He is well-developed.  HENT:     Head: Atraumatic.  Neck:     Musculoskeletal: Neck supple.  Cardiovascular:     Rate and Rhythm: Normal rate.     Pulses:          Radial pulses are 2+ on the right side and 2+ on the left side.       Dorsalis pedis pulses are 2+ on the right side and 2+ on the left side.  Pulmonary:     Effort: Pulmonary effort is normal.  Abdominal:     Palpations: Abdomen is soft.     Tenderness: There is abdominal tenderness. There is no guarding or rebound.     Comments: Epigastric abdominal pain, and generalized upper quadrant abdominal tenderness.  Skin:    General: Skin is warm.  Neurological:  Mental Status: He is alert and oriented to person, place, and time.      ED Treatments / Results  Labs (all labs ordered are listed, but only abnormal results are displayed) Labs Reviewed  BASIC METABOLIC PANEL - Abnormal; Notable for the following components:      Result Value   Potassium 3.3 (*)    CO2 21 (*)    Glucose, Bld 107 (*)    All other components within normal limits  CBC  HEPATIC FUNCTION PANEL  LIPASE, BLOOD  TROPONIN I (HIGH SENSITIVITY)  TROPONIN I (HIGH SENSITIVITY)    EKG EKG Interpretation  Date/Time:  Monday June 18 2019 18:57:31 EST Ventricular Rate:  87 PR Interval:  144 QRS Duration: 132 QT Interval:  412 QTC Calculation: 495 R Axis:   -102 Text Interpretation: Normal sinus rhythm Right bundle branch block Abnormal ECG No acute changes No significant change since last tracing Confirmed by Varney Biles [phone]) on 06/18/2019 9:59:40 PM   Radiology Dg Chest 2 View  Result Date: 06/18/2019 CLINICAL DATA:  Chest pain EXAM: CHEST - 2 VIEW COMPARISON:  04/07/2019 FINDINGS: Heart and mediastinal contours are within normal limits. No focal opacities or effusions. No acute bony abnormality. IMPRESSION: No active cardiopulmonary disease. Electronically Signed   By: Rolm Baptise M.D.   On: 06/18/2019 19:51     Procedures Procedures (including critical care time)  Medications Ordered in ED Medications  sodium chloride flush (NS) 0.9 % injection 3 mL (3 mLs Intravenous Given 06/19/19 0019)  alum & mag hydroxide-simeth (MAALOX/MYLANTA) 200-200-20 MG/5ML suspension 30 mL (30 mLs Oral Given 06/18/19 2304)    And  lidocaine (XYLOCAINE) 2 % viscous mouth solution 15 mL (15 mLs Oral Given 06/18/19 2304)  oxyCODONE-acetaminophen (PERCOCET/ROXICET) 5-325 MG per tablet 1 tablet (1 tablet Oral Given 06/18/19 2304)  LORazepam (ATIVAN) injection 1 mg (1 mg Intravenous Given 06/19/19 0019)  sodium chloride 0.9 % bolus 500 mL (500 mLs Intravenous New Bag/Given 06/19/19 0018)  ondansetron (ZOFRAN) injection 4 mg (4 mg Intravenous Given 06/19/19 0018)     Initial Impression / Assessment and Plan / ED Course  I have reviewed the triage vital signs and the nursing notes.  Pertinent labs & imaging results that were available during my care of the patient were reviewed by me and considered in my medical decision making (see chart for details).        DDx includes: Pancreatitis Hepatobiliary pathology including cholecystitis Gastritis/PUD SBO ACS syndrome Aortic dissection  63 year old comes in a chief complaint of epigastric abdominal pain/chest pain.  He reports that the pain is worse with alcohol intake and that he has been having pain off and on for the last 2 weeks.  Since patient uses cocaine we consider dissection of the differential.  He has no neurologic complaints or deficits and his vascular exam is normal.  No murmurs appreciated.  The pain is intermittent which makes dissection less likely as well.  On exam he absolutely has epigastric abdominal tenderness that is worse with palpation.  There is also the area where he points to as the area of concern.  He has known history of esophagitis and heavy alcohol use.  Lipase ordered.   12:43 AM Transferring patient's care to Dr. Dina Rich.  Lipase and CMP  pending. He is already been initiated on oral challenge.  Final Clinical Impressions(s) / ED Diagnoses   Final diagnoses:  None    ED Discharge Orders    None  Varney Biles, MD 06/19/19 [phone]

## 2019-06-18 NOTE — ED Triage Notes (Signed)
Pt arrives via EMS for CP x 3weeks. Pt took 324mg  ASA. CBG 123. Pt had 40oz beer before arriving. BP 126/88, HR 81, 97% on room air.

## 2019-06-19 ENCOUNTER — Encounter (HOSPITAL_COMMUNITY): Payer: Self-pay | Admitting: Radiology

## 2019-06-19 ENCOUNTER — Emergency Department (HOSPITAL_COMMUNITY): Payer: Medicare Other

## 2019-06-19 DIAGNOSIS — E669 Obesity, unspecified: Secondary | ICD-10-CM | POA: Diagnosis present

## 2019-06-19 DIAGNOSIS — R0789 Other chest pain: Secondary | ICD-10-CM | POA: Diagnosis present

## 2019-06-19 DIAGNOSIS — K2970 Gastritis, unspecified, without bleeding: Secondary | ICD-10-CM | POA: Diagnosis present

## 2019-06-19 DIAGNOSIS — F1114 Opioid abuse with opioid-induced mood disorder: Secondary | ICD-10-CM | POA: Diagnosis not present

## 2019-06-19 DIAGNOSIS — K56600 Partial intestinal obstruction, unspecified as to cause: Secondary | ICD-10-CM | POA: Diagnosis present

## 2019-06-19 DIAGNOSIS — K56609 Unspecified intestinal obstruction, unspecified as to partial versus complete obstruction: Secondary | ICD-10-CM | POA: Diagnosis not present

## 2019-06-19 DIAGNOSIS — F411 Generalized anxiety disorder: Secondary | ICD-10-CM | POA: Diagnosis not present

## 2019-06-19 DIAGNOSIS — Z20828 Contact with and (suspected) exposure to other viral communicable diseases: Secondary | ICD-10-CM | POA: Diagnosis present

## 2019-06-19 DIAGNOSIS — F1020 Alcohol dependence, uncomplicated: Secondary | ICD-10-CM | POA: Diagnosis present

## 2019-06-19 DIAGNOSIS — F1120 Opioid dependence, uncomplicated: Secondary | ICD-10-CM | POA: Diagnosis present

## 2019-06-19 DIAGNOSIS — K30 Functional dyspepsia: Secondary | ICD-10-CM | POA: Diagnosis present

## 2019-06-19 DIAGNOSIS — E7800 Pure hypercholesterolemia, unspecified: Secondary | ICD-10-CM | POA: Diagnosis present

## 2019-06-19 DIAGNOSIS — Z6834 Body mass index (BMI) 34.0-34.9, adult: Secondary | ICD-10-CM | POA: Diagnosis not present

## 2019-06-19 DIAGNOSIS — I712 Thoracic aortic aneurysm, without rupture: Secondary | ICD-10-CM | POA: Diagnosis present

## 2019-06-19 DIAGNOSIS — K2210 Ulcer of esophagus without bleeding: Secondary | ICD-10-CM | POA: Diagnosis present

## 2019-06-19 DIAGNOSIS — F1094 Alcohol use, unspecified with alcohol-induced mood disorder: Secondary | ICD-10-CM | POA: Diagnosis not present

## 2019-06-19 DIAGNOSIS — Z79899 Other long term (current) drug therapy: Secondary | ICD-10-CM | POA: Diagnosis not present

## 2019-06-19 DIAGNOSIS — Z9049 Acquired absence of other specified parts of digestive tract: Secondary | ICD-10-CM | POA: Diagnosis not present

## 2019-06-19 DIAGNOSIS — G4730 Sleep apnea, unspecified: Secondary | ICD-10-CM | POA: Diagnosis present

## 2019-06-19 DIAGNOSIS — I10 Essential (primary) hypertension: Secondary | ICD-10-CM | POA: Diagnosis not present

## 2019-06-19 DIAGNOSIS — R4701 Aphasia: Secondary | ICD-10-CM | POA: Diagnosis present

## 2019-06-19 DIAGNOSIS — F332 Major depressive disorder, recurrent severe without psychotic features: Secondary | ICD-10-CM | POA: Diagnosis present

## 2019-06-19 DIAGNOSIS — I480 Paroxysmal atrial fibrillation: Secondary | ICD-10-CM | POA: Diagnosis present

## 2019-06-19 DIAGNOSIS — F17210 Nicotine dependence, cigarettes, uncomplicated: Secondary | ICD-10-CM | POA: Diagnosis present

## 2019-06-19 DIAGNOSIS — K2920 Alcoholic gastritis without bleeding: Secondary | ICD-10-CM | POA: Diagnosis not present

## 2019-06-19 DIAGNOSIS — R1013 Epigastric pain: Secondary | ICD-10-CM | POA: Diagnosis not present

## 2019-06-19 DIAGNOSIS — E876 Hypokalemia: Secondary | ICD-10-CM | POA: Diagnosis not present

## 2019-06-19 DIAGNOSIS — K449 Diaphragmatic hernia without obstruction or gangrene: Secondary | ICD-10-CM | POA: Diagnosis present

## 2019-06-19 DIAGNOSIS — F1410 Cocaine abuse, uncomplicated: Secondary | ICD-10-CM | POA: Diagnosis present

## 2019-06-19 DIAGNOSIS — E119 Type 2 diabetes mellitus without complications: Secondary | ICD-10-CM | POA: Diagnosis present

## 2019-06-19 LAB — HEPATIC FUNCTION PANEL
ALT: 17 U/L (ref 0–44)
AST: 24 U/L (ref 15–41)
Albumin: 3 g/dL — ABNORMAL LOW (ref 3.5–5.0)
Alkaline Phosphatase: 62 U/L (ref 38–126)
Bilirubin, Direct: 0.1 mg/dL (ref 0.0–0.2)
Indirect Bilirubin: 0.4 mg/dL (ref 0.3–0.9)
Total Bilirubin: 0.5 mg/dL (ref 0.3–1.2)
Total Protein: 7.1 g/dL (ref 6.5–8.1)

## 2019-06-19 LAB — LIPASE, BLOOD: Lipase: 76 U/L — ABNORMAL HIGH (ref 11–51)

## 2019-06-19 LAB — BASIC METABOLIC PANEL
Anion gap: 6 (ref 5–15)
BUN: 10 mg/dL (ref 8–23)
CO2: 20 mmol/L — ABNORMAL LOW (ref 22–32)
Calcium: 8 mg/dL — ABNORMAL LOW (ref 8.9–10.3)
Chloride: 111 mmol/L (ref 98–111)
Creatinine, Ser: 0.98 mg/dL (ref 0.61–1.24)
GFR calc Af Amer: >60 mL/min (ref >60)
GFR calc non Af Amer: >60 mL/min (ref >60)
Glucose, Bld: 106 mg/dL — ABNORMAL HIGH (ref 70–99)
Potassium: 4 mmol/L (ref 3.5–5.1)
Sodium: 137 mmol/L (ref 135–145)

## 2019-06-19 LAB — GLUCOSE, CAPILLARY
Glucose-Capillary: 73 mg/dL (ref 70–99)
Glucose-Capillary: 81 mg/dL (ref 70–99)

## 2019-06-19 LAB — MAGNESIUM: Magnesium: 1.6 mg/dL — ABNORMAL LOW (ref 1.7–2.4)

## 2019-06-19 LAB — SARS CORONAVIRUS 2 (TAT 6-24 HRS): SARS Coronavirus 2: NEGATIVE

## 2019-06-19 MED ORDER — ENOXAPARIN SODIUM 40 MG/0.4ML ~~LOC~~ SOLN
40.0000 mg | SUBCUTANEOUS | Status: DC
  Administered 2019-06-19 – 2019-06-30 (×12): 40 mg via SUBCUTANEOUS
  Filled 2019-06-19 (×13): qty 0.4

## 2019-06-19 MED ORDER — MORPHINE SULFATE (PF) 4 MG/ML IV SOLN
4.0000 mg | Freq: Once | INTRAVENOUS | Status: AC
  Administered 2019-06-19: 4 mg via INTRAVENOUS
  Filled 2019-06-19: qty 1

## 2019-06-19 MED ORDER — PANTOPRAZOLE SODIUM 40 MG IV SOLR
40.0000 mg | Freq: Two times a day (BID) | INTRAVENOUS | Status: DC
  Administered 2019-06-19 – 2019-06-20 (×2): 40 mg via INTRAVENOUS
  Filled 2019-06-19 (×3): qty 40

## 2019-06-19 MED ORDER — SODIUM CHLORIDE 0.9% FLUSH
3.0000 mL | Freq: Two times a day (BID) | INTRAVENOUS | Status: DC
  Administered 2019-06-19 – 2019-06-30 (×15): 3 mL via INTRAVENOUS

## 2019-06-19 MED ORDER — KETOROLAC TROMETHAMINE 15 MG/ML IJ SOLN: 15.0000 mg | Freq: Three times a day (TID) | INTRAMUSCULAR | Status: DC | PRN

## 2019-06-19 MED ORDER — PANTOPRAZOLE SODIUM 40 MG IV SOLR
40.0000 mg | Freq: Once | INTRAVENOUS | Status: AC
  Administered 2019-06-19: 40 mg via INTRAVENOUS
  Filled 2019-06-19: qty 40

## 2019-06-19 MED ORDER — MAGNESIUM SULFATE 2 GM/50ML IV SOLN
2.0000 g | Freq: Once | INTRAVENOUS | Status: AC
  Administered 2019-06-19: 2 g via INTRAVENOUS
  Filled 2019-06-19: qty 50

## 2019-06-19 MED ORDER — NICOTINE 21 MG/24HR TD PT24
21.0000 mg | MEDICATED_PATCH | Freq: Every day | TRANSDERMAL | Status: DC
  Administered 2019-06-19 – 2019-06-30 (×12): 21 mg via TRANSDERMAL
  Filled 2019-06-19 (×12): qty 1

## 2019-06-19 MED ORDER — LORAZEPAM 2 MG/ML IJ SOLN
0.0000 mg | Freq: Two times a day (BID) | INTRAMUSCULAR | Status: AC
  Administered 2019-06-20: 2 mg via INTRAVENOUS
  Filled 2019-06-19: qty 1

## 2019-06-19 MED ORDER — SODIUM CHLORIDE 0.9 % IV SOLN: 250.0000 mL | INTRAVENOUS | Status: DC | PRN

## 2019-06-19 MED ORDER — SODIUM CHLORIDE 0.9 % IV BOLUS
500.0000 mL | Freq: Once | INTRAVENOUS | Status: AC
  Administered 2019-06-19: 500 mL via INTRAVENOUS

## 2019-06-19 MED ORDER — SODIUM CHLORIDE 0.9% FLUSH: 3.0000 mL | INTRAVENOUS | Status: DC | PRN

## 2019-06-19 MED ORDER — THIAMINE HCL 100 MG/ML IJ SOLN
100.0000 mg | Freq: Every day | INTRAMUSCULAR | Status: DC
  Administered 2019-06-20 – 2019-06-23 (×2): 100 mg via INTRAVENOUS
  Filled 2019-06-19 (×3): qty 2

## 2019-06-19 MED ORDER — SODIUM CHLORIDE 0.9 % IV SOLN
INTRAVENOUS | Status: AC
  Administered 2019-06-19: 14:00:00 via INTRAVENOUS

## 2019-06-19 MED ORDER — IOHEXOL 300 MG/ML  SOLN
100.0000 mL | Freq: Once | INTRAMUSCULAR | Status: AC | PRN
  Administered 2019-06-19: 100 mL via INTRAVENOUS

## 2019-06-19 MED ORDER — LORAZEPAM 1 MG PO TABS: 0.0000 mg | ORAL_TABLET | Freq: Two times a day (BID) | ORAL | Status: AC

## 2019-06-19 MED ORDER — LORAZEPAM 2 MG/ML IJ SOLN
0.0000 mg | Freq: Four times a day (QID) | INTRAMUSCULAR | Status: AC
  Administered 2019-06-19 – 2019-06-20 (×4): 2 mg via INTRAVENOUS
  Filled 2019-06-19 (×4): qty 1

## 2019-06-19 MED ORDER — LORAZEPAM 1 MG PO TABS: 0.0000 mg | ORAL_TABLET | Freq: Four times a day (QID) | ORAL | Status: AC

## 2019-06-19 MED ORDER — MORPHINE SULFATE (PF) 2 MG/ML IV SOLN
2.0000 mg | INTRAVENOUS | Status: DC | PRN
  Administered 2019-06-19 – 2019-06-20 (×5): 2 mg via INTRAVENOUS
  Filled 2019-06-19 (×5): qty 1

## 2019-06-19 MED ORDER — THIAMINE HCL 100 MG/ML IJ SOLN
Freq: Once | INTRAVENOUS | Status: AC
  Administered 2019-06-19: 05:00:00 via INTRAVENOUS
  Filled 2019-06-19: qty 1000

## 2019-06-19 MED ORDER — VITAMIN B-1 100 MG PO TABS
100.0000 mg | ORAL_TABLET | Freq: Every day | ORAL | Status: DC
  Administered 2019-06-21 – 2019-06-30 (×9): 100 mg via ORAL
  Filled 2019-06-19 (×10): qty 1

## 2019-06-19 MED ORDER — LABETALOL HCL 5 MG/ML IV SOLN
5.0000 mg | INTRAVENOUS | Status: DC | PRN
  Administered 2019-06-20: 5 mg via INTRAVENOUS
  Filled 2019-06-19: qty 4

## 2019-06-19 NOTE — ED Notes (Signed)
Pt incontinent of stool, linens and pt cleansed and attends applied.  Upper congestion noted as well, pt HOB elevated to alleviate the congestion.  Warm blankets given after procedures.

## 2019-06-19 NOTE — ED Notes (Signed)
Pt continues to c/o midsternal chest pain, yet returns to sleep when nursing leaves.

## 2019-06-19 NOTE — Plan of Care (Signed)

## 2019-06-19 NOTE — Progress Notes (Signed)
PROGRESS NOTE                                                                                                                                                                                                             Patient Demographics:    Todd [name], is a 63 y.o. male, DOB - Jul 25, 1956, [id number]  Admit date - 06/18/2019   Admitting Physician No admitting provider for patient encounter.  Outpatient Primary MD for the patient is [name], MD  LOS - 0  Outpatient Specialists: 63 Y/O   Chief Complaint  Patient presents with  . Chest Pain       Brief Narrative   63 y/o male with hx of polysusbstance abuse ( etoh, cocaine, heroin), paroxysmal afib/ SVT, hx of DVT &PE,  not on AC, DM type 2, hx of esophagitis and gastritis, HTN, ascending aortic aneurysm presented to ED with  2 days hx of abdominal pain with nausea and vomiting.  Patient had mildly elevated lipase and CT abdomen and pelvis showing partial distal small bowel obstruction and esophagitis.   Subjective:   C/o periumbilical and epigastric pain. Has some nausea but no vomiting since early this am.    Assessment  & Plan :    Principal Problem:   Partial small bowel obstruction (HCC)  Etiology unclear. No hx of abdominal surgery. abdominal exam this am is soft, tender but with active bowel sounds. Keep NPO for now. does not need NG. Check abdominal x ray in am and if resolved start clears. Consult surgery if no improvement or worsening. IV fluids with NS. Monitor lytes and keep k >4, mg >2 Serial abdominal exam.  Active Problems:   DM (diabetes mellitus), type 2 ([place]) Has not taken any medications in past 2 months. Monitor on SSI  Hypokalemia/ hypomagnesemia  replenish, keep k>4 and mg >2  Acute alcoholic  Pancreatitis epigastric pain with elevated lipase. IV fluids, pain control with low dose morphine prn, antiemetics. Monitor on CIWA.  thia mine folate and MV. Counseled on cessation.  Esophagitis, acute vs acute on chronic  PPI BID. Had EGD in [place] for severe dysphagia and odynophagia showing severe esophagitis with ulceration and atypia. bx negative for infection or malignancy.     OBESITY   Essential hypertension   MDD (major depressive disorder), recurrent severe, without psychosis ([place])  Not taking any mediations for past 2 months.      Code Status : full code  Family Communication  : none  Disposition Plan  :home possibly in 48 hrs  Barriers For Discharge : active symptoms  Consults  :  none  Procedures  : CT ABD  DVT Prophylaxis  :  Lovenox -   Lab Results  Component Value Date   PLT 349 06/18/2019    Antibiotics  :  Anti-infectives (From admission, onward)   None        Objective:   Vitals:   06/19/19 0245 06/19/19 0330 06/19/19 0654 06/19/19 0754  BP: (!) 159/93 (!) 149/90 129/79 132/87  Pulse:  78 90 85  Resp: 16 20 (!) 22   Temp:      TempSrc:      SpO2:  98% 98%     Wt Readings from Last 3 Encounters:  04/03/19 105.8 kg  01/12/19 103.3 kg  09/08/18 105.2 kg    No intake or output data in the 24 hours ending 06/19/19 1041   Physical Exam  Gen: in some distress with pain HEENT: dry mucosa, supple neck Chest: clear b/l, no added sounds CVS: N S1&S2, no murmurs,  GI: soft, ND, epigastric and periumbilical tenderness, BS+ Musculoskeletal: warm, no edema CNS: AAOX3,No tremors    Data Review:    CBC Recent Labs  Lab 06/18/19 1938  WBC 6.4  HGB 13.9  HCT 42.3  PLT 349  MCV 94.8  MCH 31.2  MCHC 32.9  RDW 14.1    Chemistries  Recent Labs  Lab 06/18/19 1938 06/19/19 0017 06/19/19 0913  NA 139  --  137  K 3.3*  --  4.0  CL 105  --  111  CO2 21*  --  20*  GLUCOSE 107*  --  106*  BUN 11  --  10  CREATININE 1.04  --  0.98  CALCIUM 9.2  --  8.0*  MG  --   --  1.6*  AST  --  24  --   ALT  --  17  --   ALKPHOS  --  62  --   BILITOT  --  0.5  --     ------------------------------------------------------------------------------------------------------------------ No results for input(s): CHOL, HDL, LDLCALC, TRIG, CHOLHDL, LDLDIRECT in the last 72 hours.  Lab Results  Component Value Date   HGBA1C 6.4 (H) 01/10/2019   ------------------------------------------------------------------------------------------------------------------ No results for input(s): TSH, T4TOTAL, T3FREE, THYROIDAB in the last 72 hours.  Invalid input(s): FREET3 ------------------------------------------------------------------------------------------------------------------ No results for input(s): VITAMINB12, FOLATE, FERRITIN, TIBC, IRON, RETICCTPCT in the last 72 hours.  Coagulation profile No results for input(s): INR, PROTIME in the last 168 hours.  No results for input(s): DDIMER in the last 72 hours.  Cardiac Enzymes No results for input(s): CKMB, TROPONINI, MYOGLOBIN in the last 168 hours.  Invalid input(s): CK ------------------------------------------------------------------------------------------------------------------    Component Value Date/Time   BNP 203.4 (H) 07/25/2018 0021    Inpatient Medications  Scheduled Meds: . enoxaparin (LOVENOX) injection  40 mg Subcutaneous Q24H  . LORazepam  0-4 mg Intravenous Q6H   Or  . LORazepam  0-4 mg Oral Q6H  . [START ON 06/21/2019] LORazepam  0-4 mg Intravenous Q12H   Or  . [START ON 06/21/2019] LORazepam  0-4 mg Oral Q12H  . nicotine  21 mg Transdermal Daily  . pantoprazole (PROTONIX) IV  40 mg Intravenous Q12H  . sodium chloride flush  3 mL Intravenous Q12H  . thiamine  100 mg Oral Daily   Or  . thiamine  100 mg Intravenous Daily   Continuous Infusions: . sodium chloride    . sodium chloride     PRN Meds:.sodium chloride, ketorolac, labetalol, morphine injection, sodium chloride flush  Micro Results No results found for this or any previous visit (from the past 240 hour(s)).   Radiology Reports Dg Chest 2 View  Result Date: 06/18/2019 CLINICAL DATA:  Chest pain EXAM: CHEST - 2 VIEW COMPARISON:  04/07/2019 FINDINGS: Heart and mediastinal contours are within normal limits. No focal opacities or effusions. No acute bony abnormality. IMPRESSION: No active cardiopulmonary disease. Electronically Signed   By: Rolm Baptise M.D.   On: 06/18/2019 19:51   Ct Abdomen Pelvis W Contrast  Result Date: 06/19/2019 CLINICAL DATA:  Acute generalized abdominal pain EXAM: CT ABDOMEN AND PELVIS WITH CONTRAST TECHNIQUE: Multidetector CT imaging of the abdomen and pelvis was performed using the standard protocol following bolus administration of intravenous contrast. CONTRAST:  117mL OMNIPAQUE IOHEXOL 300 MG/ML  SOLN COMPARISON:  01/09/2019 FINDINGS: Lower chest: Scarring in the lung bases. Diffuse wall thickening in the distal esophagus compatible with esophagitis. No effusions. Hepatobiliary: No focal liver abnormality is seen. Status post cholecystectomy. No biliary dilatation. Pancreas: No focal abnormality.  No ductal dilatation. Spleen: No focal abnormality.  Normal size. Adrenals/Urinary Tract: Small bilateral renal cysts. No hydronephrosis. 2.5 cm nodule in the right adrenal gland is stable. No renal or ureteral stones. No hydronephrosis. Stomach/Bowel: Dilated small bowel loops in the abdomen and pelvis. Distal small bowel loops are decompressed. Air-fluid levels present. Findings concerning for distal small bowel obstruction. Appendix is normal. Sigmoid diverticulosis. No active diverticulitis. Vascular/Lymphatic: No evidence of aneurysm or adenopathy. Reproductive: No visible focal abnormality. Other: No free fluid or free air. Musculoskeletal: No acute bony abnormality. IMPRESSION: Dilated small bowel with air-fluid levels. Distal small bowel is decompressed. Findings concerning for distal partial small bowel obstruction. Circumferential wall thickening in the distal esophagus compatible  with esophagitis. Sigmoid diverticulosis.  No active diverticulitis. Electronically Signed   By: Rolm Baptise M.D.   On: 06/19/2019 02:54    Time Spent in minutes  25   Alyria Krack M.D on 06/19/2019 at 10:41 AM  Between 7am to 7pm - Pager - [phone]  After 7pm go to [email or web address] - password [facility]  [facility] -  Office  [phone]

## 2019-06-19 NOTE — ED Notes (Signed)
C/o continued CP and nausea, states pain has not been relieved since admit.  Ranges from 8-10, currently a 10.   Pt has slowed speech, difficult at times to understand, moves easily in bed and follows instructions. Alert and oriented.

## 2019-06-19 NOTE — H&P (Addendum)
History and Physical    Todd [name]. SS:[id number] DOB: Dec 15, 1955 DOA: 06/18/2019  PCP: [name], MD    Patient coming from: Home  Chief Complaint:  63 years old male with past medical history mentioned in HPI came with a chief complaint of abdominal pain, episode of nausea.  Patient states he has a bowel movement before he came to the emergency room.  Patient denies any chest pain shortness of breath cough fever chills.  HPI: Todd Aulet. is a 63 y.o. male with medical history significant of polysubstance abuse, cocaine heroine, alcohol paroxysmal atrial fibrillation, diabetes mellitus type 2 non-insulin-dependent, history of DVT and PE, history of esophagitis and gastritis, history of hypertension, history of ascending aortic aneurysm, no history of abdominal surgery  ED Course:  In the emergency room complaining of epigastric pain one episode of vomiting Being a chronic alcoholic with suspicion of pancreatitis Lipase normal CT abdomen and pelvis no signs of pancreatitis but signs of partial small bowel obstruction   Review of Systems: As per HPI otherwise 10 point review of systems negative.  No change in mental status No headache No shortness of breath No chest pain Abdominal pain and one episode of vomiting No dysuria polyuria Musculoskeletal no back pain or joint pain Skin no rash No focal neurologic deficits Psych mild depression Otherwise the rest of systems negative  Past Medical History:  Diagnosis Date   Arthritis    "fingers" (07/14/2018)   Depression    DVT (deep venous thrombosis) ([place]) LLE   Hepatitis C     finished harvoni tx ~ 2017   Hypercholesterolemia    Hypertension    Prostate cancer ([place]) 2yrs ago   Pulmonary embolism and infarction ([place]) 07/13/2018   Sleep apnea    not currently using cpap, mask causing vertigo   Type II diabetes mellitus ([place])     Past Surgical History:  Procedure Laterality Date   BIOPSY   01/12/2019   Procedure: BIOPSY;  Surgeon: Ronald Lobo, MD;  Location: [facility];  Service: Endoscopy;;   COLONOSCOPY WITH PROPOFOL N/A 02/19/2014   Procedure: COLONOSCOPY WITH PROPOFOL;  Surgeon: Garlan Fair, MD;  Location: [facility];  Service: Endoscopy;  Laterality: N/A;   ENDOVENOUS ABLATION SAPHENOUS VEIN W/ LASER Left 11/22/2017   endovenous laser ablation L SSV by Tinnie Gens MD    ESOPHAGOGASTRODUODENOSCOPY (EGD) WITH PROPOFOL N/A 01/12/2019   Procedure: ESOPHAGOGASTRODUODENOSCOPY (EGD) WITH PROPOFOL;  Surgeon: Ronald Lobo, MD;  Location: [facility];  Service: Endoscopy;  Laterality: N/A;  Patient is also scheduled for barium swallow; please notify radiology after patient's EGD is complete so that barium swallow follows the endoscopy, not vice versa   PROSTATECTOMY  2008   REPAIR QUADRICEPS / HAMSTRING MUSCLE Right      reports that he has been smoking. He has a 5.40 pack-year smoking history. He has never used smokeless tobacco. He reports current alcohol use. He reports previous drug use.  No Known Allergies  Family History  Problem Relation Age of Onset   Cancer Father        PROSTATE     Prior to Admission medications   Medication Sig Start Date End Date Taking? Authorizing Provider  losartan (COZAAR) 100 MG tablet Take 1 tablet (100 mg total) by mouth daily. 04/07/19 06/18/28 Yes Barb Merino, MD  atorvastatin (LIPITOR) 10 MG tablet Take 1 tablet (10 mg total) by mouth daily at 6 PM. 04/06/19 05/06/19  Barb Merino, MD  loperamide (IMODIUM) 2 MG capsule  Take 2 capsules (4 mg total) by mouth every 6 (six) hours as needed for diarrhea or loose stools. 04/06/19   Barb Merino, MD  metFORMIN (GLUCOPHAGE) 1000 MG tablet Take 1,000 mg by mouth 2 (two) times daily with a meal.    [provider]  metoprolol tartrate (LOPRESSOR) 25 MG tablet Take 1 tablet (25 mg total) by mouth 2 (two) times daily. 04/06/19 05/06/19  Barb Merino, MD  Multiple  Vitamins-Minerals (CENTRUM SILVER 50+MEN) TABS Take 1 tablet by mouth daily with breakfast.    [provider]  omeprazole (PRILOSEC) 20 MG capsule Take 1 capsule (20 mg total) by mouth 2 (two) times daily before a meal. 04/06/19 05/06/19  Barb Merino, MD  potassium chloride (K-DUR) 10 MEQ tablet Take 1 tablet (10 mEq total) by mouth daily for 7 days. 04/06/19 04/13/19  Barb Merino, MD  sertraline (ZOLOFT) 100 MG tablet Take 100 mg by mouth every morning.    [provider]  traZODone (DESYREL) 100 MG tablet Take 1 tablet (100 mg total) by mouth at bedtime. 07/04/18   Pennelope Bracken, MD  venlafaxine (EFFEXOR) 100 MG tablet Take 100 mg by mouth 2 (two) times daily.    [provider]    Physical Exam: Vitals:   06/19/19 0239 06/19/19 0240 06/19/19 0245 06/19/19 0330  BP: (!) 158/99 (!) 158/99 (!) 159/93 (!) 149/90  Pulse:  78  78  Resp:  17 16 20   Temp:      TempSrc:      SpO2:  98%  98%    Constitutional: NAD, calm, comfortable Vitals:   06/19/19 0239 06/19/19 0240 06/19/19 0245 06/19/19 0330  BP: (!) 158/99 (!) 158/99 (!) 159/93 (!) 149/90  Pulse:  78  78  Resp:  17 16 20   Temp:      TempSrc:      SpO2:  98%  98%   Eyes: PERRL, lids and conjunctivae normal ENMT: Mucous membranes are moist. Posterior pharynx clear of any exudate or lesions.Normal dentition.  Neck: normal, supple, no masses, no thyromegaly Respiratory: clear to auscultation bilaterally, no wheezing, no crackles. Normal respiratory effort. No accessory muscle use.  Cardiovascular: Regular rate and rhythm, no murmurs / rubs / gallops. No extremity edema. 2+ pedal pulses. No carotid bruits.  Abdomen: +++ tenderness, ++++ abdominal pain ,no masses palpated. No hepatosplenomegaly. Bowel sounds positive.  Musculoskeletal: no clubbing / cyanosis. No joint deformity upper and lower extremities. Good ROM, no contractures. Normal muscle tone.  Skin: no rashes, lesions, ulcers. No  induration Neurologic: CN 2-12 grossly intact. Sensation intact, DTR normal. Strength 5/5 in all 4.  Psychiatric: Normal judgment and insight. Alert and oriented x 3. Normal mood.    Labs on Admission: I have personally reviewed following labs and imaging studies  CBC: Recent Labs  Lab 06/18/19 1938  WBC 6.4  HGB 13.9  HCT 42.3  MCV 94.8  PLT 0000000   Basic Metabolic Panel: Recent Labs  Lab 06/18/19 1938  NA 139  K 3.3*  CL 105  CO2 21*  GLUCOSE 107*  BUN 11  CREATININE 1.04  CALCIUM 9.2   GFR: CrCl cannot be calculated (Unknown ideal weight.). Liver Function Tests: Recent Labs  Lab 06/19/19 0017  AST 24  ALT 17  ALKPHOS 62  BILITOT 0.5  PROT 7.1  ALBUMIN 3.0*   Recent Labs  Lab 06/19/19 0017  LIPASE 76*   No results for input(s): AMMONIA in the last 168 hours. Coagulation Profile: No results  for input(s): INR, PROTIME in the last 168 hours. Cardiac Enzymes: No results for input(s): CKTOTAL, CKMB, CKMBINDEX, TROPONINI in the last 168 hours. BNP (last 3 results) No results for input(s): PROBNP in the last 8760 hours. HbA1C: No results for input(s): HGBA1C in the last 72 hours. CBG: No results for input(s): GLUCAP in the last 168 hours. Lipid Profile: No results for input(s): CHOL, HDL, LDLCALC, TRIG, CHOLHDL, LDLDIRECT in the last 72 hours. Thyroid Function Tests: No results for input(s): TSH, T4TOTAL, FREET4, T3FREE, THYROIDAB in the last 72 hours. Anemia Panel: No results for input(s): VITAMINB12, FOLATE, FERRITIN, TIBC, IRON, RETICCTPCT in the last 72 hours. Urine analysis:    Component Value Date/Time   COLORURINE YELLOW 04/03/2019 1722   APPEARANCEUR CLOUDY (A) 04/03/2019 1722   LABSPEC 1.010 04/03/2019 1722   PHURINE 6.0 04/03/2019 1722   GLUCOSEU 50 (A) 04/03/2019 1722   HGBUR SMALL (A) 04/03/2019 1722   BILIRUBINUR NEGATIVE 04/03/2019 1722   KETONESUR NEGATIVE 04/03/2019 1722   PROTEINUR 100 (A) 04/03/2019 1722   UROBILINOGEN 0.2  12/24/2009 1944   NITRITE NEGATIVE 04/03/2019 1722   LEUKOCYTESUR NEGATIVE 04/03/2019 1722    Radiological Exams on Admission: Dg Chest 2 View  Result Date: 06/18/2019 CLINICAL DATA:  Chest pain EXAM: CHEST - 2 VIEW COMPARISON:  04/07/2019 FINDINGS: Heart and mediastinal contours are within normal limits. No focal opacities or effusions. No acute bony abnormality. IMPRESSION: No active cardiopulmonary disease. Electronically Signed   By: Rolm Baptise M.D.   On: 06/18/2019 19:51   Ct Abdomen Pelvis W Contrast  Result Date: 06/19/2019 CLINICAL DATA:  Acute generalized abdominal pain EXAM: CT ABDOMEN AND PELVIS WITH CONTRAST TECHNIQUE: Multidetector CT imaging of the abdomen and pelvis was performed using the standard protocol following bolus administration of intravenous contrast. CONTRAST:  173mL OMNIPAQUE IOHEXOL 300 MG/ML  SOLN COMPARISON:  01/09/2019 FINDINGS: Lower chest: Scarring in the lung bases. Diffuse wall thickening in the distal esophagus compatible with esophagitis. No effusions. Hepatobiliary: No focal liver abnormality is seen. Status post cholecystectomy. No biliary dilatation. Pancreas: No focal abnormality.  No ductal dilatation. Spleen: No focal abnormality.  Normal size. Adrenals/Urinary Tract: Small bilateral renal cysts. No hydronephrosis. 2.5 cm nodule in the right adrenal gland is stable. No renal or ureteral stones. No hydronephrosis. Stomach/Bowel: Dilated small bowel loops in the abdomen and pelvis. Distal small bowel loops are decompressed. Air-fluid levels present. Findings concerning for distal small bowel obstruction. Appendix is normal. Sigmoid diverticulosis. No active diverticulitis. Vascular/Lymphatic: No evidence of aneurysm or adenopathy. Reproductive: No visible focal abnormality. Other: No free fluid or free air. Musculoskeletal: No acute bony abnormality. IMPRESSION: Dilated small bowel with air-fluid levels. Distal small bowel is decompressed. Findings concerning  for distal partial small bowel obstruction. Circumferential wall thickening in the distal esophagus compatible with esophagitis. Sigmoid diverticulosis.  No active diverticulitis. Electronically Signed   By: Rolm Baptise M.D.   On: 06/19/2019 02:54    EKG: Independently reviewed.  Normal sinus rhythm right bundle branch block  Assessment/Plan  Partial small bowel obstruction Abdominal pain some vomiting Lipase and CT abdomen pelvis negative for pancreatitis positive for partial small bowel obstruction Plan NG tube n.p.o. IV fluids if no better surgical consult Patient no history of abdominal surgery  Diabetes mellitus type 2 non-insulin dependent Patient n.p.o., IV fluids insulin sliding scale   Obesity Encourage  losing weight  Gastritis, esophagitis PPI  Paroxysmal thick atrial fibrillation Now in sinus Lopressor /labetalol IV as needed  History of DVT and  PE Patient not anticoagulated  History of polysubstance abuse Stop taking cocaine and heroine  Ascending aortic aneurysm Follow-up with cardiology  Chronic alcoholism Withdrawal protocol started by ED physician  History of sleep apnea Recommend CPAP during the night when patient gets better    Principal Problem:   Partial small bowel obstruction ([place]) Active Problems:   DM (diabetes mellitus), type 2 ([place])   OBESITY   Essential hypertension   MDD (major depressive disorder), recurrent severe, without psychosis ([place])   Opioid use disorder, moderate, dependence ([place])      DVT prophylaxis: Lovenox Code Status: Full code Family Communication: None Disposition Plan: Discharge home Consults called: None Admission status: Inpatient   Armanda Forand G Susano Cleckler MD [facility]  If 7PM-7AM, please contact night-coverage [email or web address]   06/19/2019, 4:24 AM

## 2019-06-19 NOTE — ED Provider Notes (Signed)
Patient signed out pending hepatic function and lipase testing.  In brief he is a known alcoholic with known chronic gastritis who presents with abdominal pain and chest pain.  Work-up was largely reassuring.  He was treated conservatively for gastritis.  Lab work reviewed.  Lipase is mildly elevated at 76.  On my evaluation the patient, he continues to endorse 10 out of 10 abdominal pain.  He looks uncomfortable and is tender to palpation.  I have reviewed his chart.  No recent abdominal imaging.  Question whether he may have more serious pancreatitis not reflective in his labs.  Will obtain a CT.  Patient was given 500 cc of fluid and morphine.  3:15 AM CT scan with evidence of esophagitis.  Also concerning for distal small bowel obstruction.  Patient continues to plane of pain.  He did have 1 episode of emesis prior to arrival.  No significant abdominal surgical history.  Patient was redosed pain medication.  We will plan to admit.  Patient placed on CIWA protocol as last drink was yesterday.   Merryl Hacker, MD 06/19/19 [phone]

## 2019-06-19 NOTE — ED Notes (Signed)
Admitting at bedside 

## 2019-06-19 NOTE — Progress Notes (Signed)
Pt arrived to room 6N13 via stretcher from the ED. Received report from [place], [place]. See assessment. Will continue to monitor.

## 2019-06-19 NOTE — ED Notes (Signed)
Pt sleeping prior to changing IV, awakens and reports pain is an 8 with moaning and hyperventilation.

## 2019-06-20 ENCOUNTER — Inpatient Hospital Stay (HOSPITAL_COMMUNITY): Payer: Medicare Other

## 2019-06-20 DIAGNOSIS — R1013 Epigastric pain: Secondary | ICD-10-CM

## 2019-06-20 DIAGNOSIS — K2920 Alcoholic gastritis without bleeding: Secondary | ICD-10-CM

## 2019-06-20 LAB — COMPREHENSIVE METABOLIC PANEL
ALT: 12 U/L (ref 0–44)
AST: 14 U/L — ABNORMAL LOW (ref 15–41)
Albumin: 2.2 g/dL — ABNORMAL LOW (ref 3.5–5.0)
Alkaline Phosphatase: 49 U/L (ref 38–126)
Anion gap: 7 (ref 5–15)
BUN: 11 mg/dL (ref 8–23)
CO2: 18 mmol/L — ABNORMAL LOW (ref 22–32)
Calcium: 7.9 mg/dL — ABNORMAL LOW (ref 8.9–10.3)
Chloride: 116 mmol/L — ABNORMAL HIGH (ref 98–111)
Creatinine, Ser: 0.91 mg/dL (ref 0.61–1.24)
GFR calc Af Amer: >60 mL/min (ref >60)
GFR calc non Af Amer: >60 mL/min (ref >60)
Glucose, Bld: 83 mg/dL (ref 70–99)
Potassium: 4 mmol/L (ref 3.5–5.1)
Sodium: 141 mmol/L (ref 135–145)
Total Bilirubin: 0.6 mg/dL (ref 0.3–1.2)
Total Protein: 5.1 g/dL — ABNORMAL LOW (ref 6.5–8.1)

## 2019-06-20 LAB — GLUCOSE, CAPILLARY
Glucose-Capillary: 76 mg/dL (ref 70–99)
Glucose-Capillary: 123 mg/dL — ABNORMAL HIGH (ref 70–99)
Glucose-Capillary: 75 mg/dL (ref 70–99)
Glucose-Capillary: 120 mg/dL — ABNORMAL HIGH (ref 70–99)

## 2019-06-20 LAB — CBC
HCT: 32.3 % — ABNORMAL LOW (ref 39.0–52.0)
Hemoglobin: 10.7 g/dL — ABNORMAL LOW (ref 13.0–17.0)
MCH: 31.5 pg (ref 26.0–34.0)
MCHC: 33.1 g/dL (ref 30.0–36.0)
MCV: 95 fL (ref 80.0–100.0)
Platelets: 253 10*3/uL (ref 150–400)
RBC: 3.4 MIL/uL — ABNORMAL LOW (ref 4.22–5.81)
RDW: 14.3 % (ref 11.5–15.5)
WBC: 5.6 10*3/uL (ref 4.0–10.5)
nRBC: 0 % (ref 0.0–0.2)

## 2019-06-20 MED ORDER — PANTOPRAZOLE SODIUM 40 MG PO TBEC
40.0000 mg | DELAYED_RELEASE_TABLET | Freq: Two times a day (BID) | ORAL | Status: DC
  Administered 2019-06-20 – 2019-06-22 (×4): 40 mg via ORAL
  Filled 2019-06-20 (×4): qty 1

## 2019-06-20 MED ORDER — MORPHINE SULFATE (PF) 2 MG/ML IV SOLN
2.0000 mg | INTRAVENOUS | Status: DC | PRN
  Administered 2019-06-20 – 2019-06-27 (×43): 2 mg via INTRAVENOUS
  Filled 2019-06-20 (×44): qty 1

## 2019-06-20 MED ORDER — SODIUM CHLORIDE 0.9 % IV SOLN
INTRAVENOUS | Status: AC
  Administered 2019-06-20: 17:00:00 via INTRAVENOUS

## 2019-06-20 MED ORDER — ALUM & MAG HYDROXIDE-SIMETH 200-200-20 MG/5ML PO SUSP
30.0000 mL | Freq: Once | ORAL | Status: AC
  Administered 2019-06-20: 30 mL via ORAL
  Filled 2019-06-20: qty 30

## 2019-06-20 NOTE — Progress Notes (Signed)
Patient complaining of increased pain. BP 183/90 pulse 56. Dr. Clementeen Graham notified and orders received. Doctor said it is ok to give labetalol 5mg  with pulse 56.

## 2019-06-20 NOTE — Progress Notes (Signed)
PROGRESS NOTE                                                                                                                                                                                                             Patient Demographics:    Todd [name], is a 63 y.o. male, DOB - [birth year], [id number]  Admit date - 06/18/2019   Admitting Physician Assunta Found, MD  Outpatient Primary MD for the patient is [name], MD  LOS - 1  Outpatient Specialists: 63 Y/O   Chief Complaint  Patient presents with   Chest Pain       Brief Narrative   63 y/o male with hx of polysusbstance abuse ( etoh, cocaine, heroin), paroxysmal afib/ SVT, hx of DVT &PE,  not on AC, DM type 2, hx of esophagitis and gastritis, HTN, ascending aortic aneurysm presented to ED with  2 days hx of abdominal pain with nausea and vomiting.  Patient had mildly elevated lipase and CT abdomen and pelvis showing partial distal small bowel obstruction and esophagitis.   Subjective:   Still having periumbilical and epigastric pain. No vomiting since yesterday   Assessment  & Plan :    Principal Problem:   Partial small bowel obstruction (H[facility])  Hx of abdominal hernia repair, may have caused adhesions. Resolved on exam and xray this am.s tart clears, advance diet slowly. Gentle IV hydration . Monitor electrolytes.  Active Problems:   DM (diabetes mellitus), type 2 ([place]) Has not taken any medications in past 2 months. Monitor on SSI  Hypokalemia/ hypomagnesemia  replenished, keep k>4 and mg >2  Acute alcoholic Pancreatitis epigastric pain with elevated lipase. IV fluids, pain control with low dose morphine prn, antiemetics. Monitor on CIWA. thiamine folate and MV. Counseled on cessation.  Esophagitis, acute vs acute on chronic  PPI BID. Had EGD in May for severe dysphagia and odynophagia showing severe esophagitis with ulceration and  atypia. bx negative for infection or malignancy. Noted for drop in H&h this am.  Monitor closely. If further drop or positive hemoccult, will need GI eval.      OBESITY   Essential hypertension   MDD (major depressive disorder), recurrent severe, without psychosis ([place]) Not taking any mediations for past 2 months. BP elevated. Add low dose amlodipine      Code Status :  full code  Family Communication  : none  Disposition Plan  :home possibly in 48 hrs if abd pain improved and tolerating advanced diet  Barriers For Discharge : active symptoms  Consults  :  none  Procedures  : CT ABD  DVT Prophylaxis  :  Lovenox -   Lab Results  Component Value Date   PLT 253 06/20/2019    Antibiotics  :  Anti-infectives (From admission, onward)   None        Objective:   Vitals:   06/19/19 1500 06/19/19 1515 06/19/19 1556 06/19/19 2004  BP: 129/71 135/75 (!) 160/95 (!) 166/96  Pulse: 67 70 69   Resp: 17 20 18    Temp:   98.5 F (36.9 C) 97.6 F (36.4 C)  TempSrc:   Oral Oral  SpO2: 97% 96% 99%     Wt Readings from Last 3 Encounters:  04/03/19 105.8 kg  01/12/19 103.3 kg  09/08/18 105.2 kg     Intake/Output Summary (Last 24 hours) at 06/20/2019 1046 Last data filed at 06/20/2019 0739 Gross per 24 hour  Intake 3050.98 ml  Output 700 ml  Net 2350.98 ml   Physical exam  nAD  HEENT: moist mucosa Chest: clear  CVS: NS1&S2  GI: epigastric tenderness to palpation, ND, BS+ Musculoskeletal: warn, no edema CNS: tremors resolved.      Data Review:    CBC Recent Labs  Lab 06/18/19 1938 06/20/19 0148  WBC 6.4 5.6  HGB 13.9 10.7*  HCT 42.3 32.3*  PLT 349 253  MCV 94.8 95.0  MCH 31.2 31.5  MCHC 32.9 33.1  RDW 14.1 14.3    Chemistries  Recent Labs  Lab 06/18/19 1938 06/19/19 0017 06/19/19 0913 06/20/19 0148  NA 139  --  137 141  K 3.3*  --  4.0 4.0  CL 105  --  111 116*  CO2 21*  --  20* 18*  GLUCOSE 107*  --  106* 83  BUN 11  --  10 11    CREATININE 1.04  --  0.98 0.91  CALCIUM 9.2  --  8.0* 7.9*  MG  --   --  1.6*  --   AST  --  24  --  14*  ALT  --  17  --  12  ALKPHOS  --  62  --  49  BILITOT  --  0.5  --  0.6   ------------------------------------------------------------------------------------------------------------------ No results for input(s): CHOL, HDL, LDLCALC, TRIG, CHOLHDL, LDLDIRECT in the last 72 hours.  Lab Results  Component Value Date   HGBA1C 6.4 (H) 01/10/2019   ------------------------------------------------------------------------------------------------------------------ No results for input(s): TSH, T4TOTAL, T3FREE, THYROIDAB in the last 72 hours.  Invalid input(s): FREET3 ------------------------------------------------------------------------------------------------------------------ No results for input(s): VITAMINB12, FOLATE, FERRITIN, TIBC, IRON, RETICCTPCT in the last 72 hours.  Coagulation profile No results for input(s): INR, PROTIME in the last 168 hours.  No results for input(s): DDIMER in the last 72 hours.  Cardiac Enzymes No results for input(s): CKMB, TROPONINI, MYOGLOBIN in the last 168 hours.  Invalid input(s): CK ------------------------------------------------------------------------------------------------------------------    Component Value Date/Time   BNP 203.4 (H) 07/25/2018 0021    Inpatient Medications  Scheduled Meds:  enoxaparin (LOVENOX) injection  40 mg Subcutaneous Q24H   LORazepam  0-4 mg Intravenous Q6H   Or   LORazepam  0-4 mg Oral Q6H   [START ON 06/21/2019] LORazepam  0-4 mg Intravenous Q12H   Or   [START ON 06/21/2019] LORazepam  0-4 mg Oral  Q12H   nicotine  21 mg Transdermal Daily   pantoprazole (PROTONIX) IV  40 mg Intravenous Q12H   sodium chloride flush  3 mL Intravenous Q12H   thiamine  100 mg Oral Daily   Or   thiamine  100 mg Intravenous Daily   Continuous Infusions:  sodium chloride     PRN Meds:.sodium chloride,  ketorolac, labetalol, morphine injection, sodium chloride flush  Micro Results Recent Results (from the past 240 hour(s))  SARS CORONAVIRUS 2 (TAT 6-24 HRS) Nasopharyngeal Nasopharyngeal Swab     Status: None   Collection Time: 06/19/19  4:28 AM   Specimen: Nasopharyngeal Swab  Result Value Ref Range Status   SARS Coronavirus 2 NEGATIVE NEGATIVE Final    Comment: (NOTE) SARS-CoV-2 target nucleic acids are NOT DETECTED. The SARS-CoV-2 RNA is generally detectable in upper and lower respiratory specimens during the acute phase of infection. Negative results do not preclude SARS-CoV-2 infection, do not rule out co-infections with other pathogens, and should not be used as the sole basis for treatment or other patient management decisions. Negative results must be combined with clinical observations, patient history, and epidemiological information. The expected result is Negative. Fact Sheet for Patients: SugarRoll.be Fact Sheet for Healthcare Providers: https://www.woods-mathews.com/ This test is not yet approved or cleared by the [place] FDA and  has been authorized for detection and/or diagnosis of SARS-CoV-2 by FDA under an Emergency Use Authorization (EUA). This EUA will remain  in effect (meaning this test can be used) for the duration of the COVID-19 declaration under Section 56 4(b)(1) of the Act, 21 U.S.C. section 360bbb-3(b)(1), unless the authorization is terminated or revoked sooner. Performed at [facility], [place] [address], [place], [place] [address]     Radiology Reports Dg Chest 2 View  Result Date: 06/18/2019 CLINICAL DATA:  Chest pain EXAM: CHEST - 2 VIEW COMPARISON:  04/07/2019 FINDINGS: Heart and mediastinal contours are within normal limits. No focal opacities or effusions. No acute bony abnormality. IMPRESSION: No active cardiopulmonary disease. Electronically Signed   By: Rolm Baptise M.D.   On: 06/18/2019  19:51   Ct Abdomen Pelvis W Contrast  Result Date: 06/19/2019 CLINICAL DATA:  Acute generalized abdominal pain EXAM: CT ABDOMEN AND PELVIS WITH CONTRAST TECHNIQUE: Multidetector CT imaging of the abdomen and pelvis was performed using the standard protocol following bolus administration of intravenous contrast. CONTRAST:  159mL OMNIPAQUE IOHEXOL 300 MG/ML  SOLN COMPARISON:  01/09/2019 FINDINGS: Lower chest: Scarring in the lung bases. Diffuse wall thickening in the distal esophagus compatible with esophagitis. No effusions. Hepatobiliary: No focal liver abnormality is seen. Status post cholecystectomy. No biliary dilatation. Pancreas: No focal abnormality.  No ductal dilatation. Spleen: No focal abnormality.  Normal size. Adrenals/Urinary Tract: Small bilateral renal cysts. No hydronephrosis. 2.5 cm nodule in the right adrenal gland is stable. No renal or ureteral stones. No hydronephrosis. Stomach/Bowel: Dilated small bowel loops in the abdomen and pelvis. Distal small bowel loops are decompressed. Air-fluid levels present. Findings concerning for distal small bowel obstruction. Appendix is normal. Sigmoid diverticulosis. No active diverticulitis. Vascular/Lymphatic: No evidence of aneurysm or adenopathy. Reproductive: No visible focal abnormality. Other: No free fluid or free air. Musculoskeletal: No acute bony abnormality. IMPRESSION: Dilated small bowel with air-fluid levels. Distal small bowel is decompressed. Findings concerning for distal partial small bowel obstruction. Circumferential wall thickening in the distal esophagus compatible with esophagitis. Sigmoid diverticulosis.  No active diverticulitis. Electronically Signed   By: Rolm Baptise M.D.   On: 06/19/2019  02:54   Dg Abd Portable 2v  Result Date: 06/20/2019 CLINICAL DATA:  Upper abdominal pain 3 weeks. Assess for small bowel obstruction. EXAM: PORTABLE ABDOMEN - 2 VIEW COMPARISON:  CT 01/09/2019 and chest x-ray 07/24/2018 FINDINGS: Supine AP  view and right lateral decubitus views were obtained. There are a few air-filled nondilated small bowel loops over the right mid to lower abdomen and left mid to upper abdomen. No significant air-fluid levels. No evidence of free peritoneal air. Air and stool present over the rectosigmoid colon. Surgical clips over the right upper quadrant. Multiple surgical clips over the left mid to lower abdomen likely due to previous abdominal wall hernia repair. Degenerative change of the spine and hips. IMPRESSION: Nonspecific, nonobstructive bowel gas pattern. Electronically Signed   By: Marin Olp M.D.   On: 06/20/2019 07:26    Time Spent in minutes  35   Ingeborg Fite M.D on 06/20/2019 at 10:46 AM  Between 7am to 7pm - Pager - [phone]  After 7pm go to [email or web address] - password [facility]  [facility] -  Office  [phone]

## 2019-06-21 DIAGNOSIS — I10 Essential (primary) hypertension: Secondary | ICD-10-CM

## 2019-06-21 DIAGNOSIS — K56609 Unspecified intestinal obstruction, unspecified as to partial versus complete obstruction: Secondary | ICD-10-CM

## 2019-06-21 LAB — CBC
HCT: 32.7 % — ABNORMAL LOW (ref 39.0–52.0)
Hemoglobin: 10.8 g/dL — ABNORMAL LOW (ref 13.0–17.0)
MCH: 31.1 pg (ref 26.0–34.0)
MCHC: 33 g/dL (ref 30.0–36.0)
MCV: 94.2 fL (ref 80.0–100.0)
Platelets: 246 10*3/uL (ref 150–400)
RBC: 3.47 MIL/uL — ABNORMAL LOW (ref 4.22–5.81)
RDW: 13.9 % (ref 11.5–15.5)
WBC: 5.3 10*3/uL (ref 4.0–10.5)
nRBC: 0 % (ref 0.0–0.2)

## 2019-06-21 LAB — GLUCOSE, CAPILLARY
Glucose-Capillary: 83 mg/dL (ref 70–99)
Glucose-Capillary: 91 mg/dL (ref 70–99)
Glucose-Capillary: 94 mg/dL (ref 70–99)
Glucose-Capillary: 73 mg/dL (ref 70–99)

## 2019-06-21 LAB — COMPREHENSIVE METABOLIC PANEL
ALT: 13 U/L (ref 0–44)
AST: 16 U/L (ref 15–41)
Albumin: 2.4 g/dL — ABNORMAL LOW (ref 3.5–5.0)
Alkaline Phosphatase: 49 U/L (ref 38–126)
Anion gap: 10 (ref 5–15)
BUN: 5 mg/dL — ABNORMAL LOW (ref 8–23)
CO2: 21 mmol/L — ABNORMAL LOW (ref 22–32)
Calcium: 8 mg/dL — ABNORMAL LOW (ref 8.9–10.3)
Chloride: 106 mmol/L (ref 98–111)
Creatinine, Ser: 0.84 mg/dL (ref 0.61–1.24)
GFR calc Af Amer: >60 mL/min (ref >60)
GFR calc non Af Amer: >60 mL/min (ref >60)
Glucose, Bld: 124 mg/dL — ABNORMAL HIGH (ref 70–99)
Potassium: 2.9 mmol/L — ABNORMAL LOW (ref 3.5–5.1)
Sodium: 137 mmol/L (ref 135–145)
Total Bilirubin: 0.5 mg/dL (ref 0.3–1.2)
Total Protein: 5.4 g/dL — ABNORMAL LOW (ref 6.5–8.1)

## 2019-06-21 MED ORDER — ALUM & MAG HYDROXIDE-SIMETH 200-200-20 MG/5ML PO SUSP: 30.0000 mL | Freq: Four times a day (QID) | ORAL | Status: DC | PRN

## 2019-06-21 MED ORDER — POTASSIUM CHLORIDE CRYS ER 20 MEQ PO TBCR
40.0000 meq | EXTENDED_RELEASE_TABLET | ORAL | Status: AC
  Administered 2019-06-21 (×3): 40 meq via ORAL
  Filled 2019-06-21 (×3): qty 2

## 2019-06-21 MED ORDER — POTASSIUM CHLORIDE 10 MEQ/100ML IV SOLN
10.0000 meq | INTRAVENOUS | Status: DC
  Administered 2019-06-21: 10 meq via INTRAVENOUS
  Filled 2019-06-21: qty 100

## 2019-06-21 MED ORDER — SUCRALFATE 1 GM/10ML PO SUSP
1.0000 g | Freq: Three times a day (TID) | ORAL | Status: DC
  Administered 2019-06-21 – 2019-06-30 (×33): 1 g via ORAL
  Filled 2019-06-21 (×34): qty 10

## 2019-06-21 NOTE — Progress Notes (Addendum)
[facility]  PROGRESS NOTE  Todd [name]. OU:[id number] DOB: 06/02/1956 DOA: 06/18/2019 PCP: [name], MD   Brief HPI:   63 year old [name] with a history of polysubstance abuse, EtOH, cocaine, heroin, paroxysmal atrial fibrillation/SVT, history of DVT and pulmonary embolism, not on anticoagulation, diabetes mellitus type 2, history of esophagitis and gastritis, hypertension, ascending aortic aneurysm came to ED with 2 days history of abdominal pain with nausea and vomiting.  He also had mildly elevated lipase and CT abdomen/pelvis showed partial distal SBO with esophagitis.    Subjective   Patient seen and examined, still complains of abdominal pain.   Assessment/Plan:     1. Partial small bowel obstruction-patient has history of abdominal hernia repair, likely has adhesions.  SBO resolved, x-ray abdomen showed nonspecific nonobstructive bowel gas pattern.  2. Abdominal pain/gastritis/esophagitis-patient continues to have abdominal pain.  Will start Carafate 1 g 3 times daily with meals.  Diet has been changed to full liquid diet.  Patient had EGD in May for severe dysphagia and odynophagia which showed severe esophagitis with ulceration and atypia.  Continue Protonix 40 mg p.o. twice daily biopsy was negative for infection or malignancy.  Continue IV morphine 2 mg every 3 hours as needed for pain will consult GI for further recommendations.  3. Acute pancreatitis-likely alcohol induced, patient also has epigastric pain with elevated lipase.  Continue IV normal saline, pain control with morphine as needed.  Continue thiamine, folate.  4. History of alcohol abuse-started on CIWA protocol, thiamine and folate.  No signs and symptoms of alcohol withdrawal at this time.  5. Hypertension-blood pressure is stable, Cozaar and Lopressor are currently on hold.  6. Hypokalemia- Will replace potassium with K dur 40 meq po q 4h x 3 doses.  Patient is unable to tolerate IV potassium.   Follow bmp in am.       CBG: Recent Labs  Lab 06/20/19 1144 06/20/19 1705 06/20/19 2045 06/21/19 0751 06/21/19 1216  GLUCAP 123* 75 120* 83 91    CBC: Recent Labs  Lab 06/18/19 1938 06/20/19 0148 06/21/19 0200  WBC 6.4 5.6 5.3  HGB 13.9 10.7* 10.8*  HCT 42.3 32.3* 32.7*  MCV 94.8 95.0 94.2  PLT 349 253 0000000    Basic Metabolic Panel: Recent Labs  Lab 06/18/19 1938 06/19/19 0913 06/20/19 0148 06/21/19 0200  NA 139 137 141 137  K 3.3* 4.0 4.0 2.9*  CL 105 111 116* 106  CO2 21* 20* 18* 21*  GLUCOSE 107* 106* 83 124*  BUN 11 10 11  5*  CREATININE 1.04 0.98 0.91 0.84  CALCIUM 9.2 8.0* 7.9* 8.0*  MG  --  1.6*  --   --      DVT prophylaxis: Lovenox  Code Status: Full code  Family Communication: No family at bedside  Disposition Plan: likely home when medically ready for discharge        BMI  Estimated body mass index is 34.44 kg/m as calculated from the following:   Height as of 04/03/19: 5\' 9"  (1.753 m).   Weight as of 04/03/19: 105.8 kg.  Scheduled medications:  . enoxaparin (LOVENOX) injection  40 mg Subcutaneous Q24H  . LORazepam  0-4 mg Intravenous Q12H   Or  . LORazepam  0-4 mg Oral Q12H  . nicotine  21 mg Transdermal Daily  . pantoprazole  40 mg Oral BID  . potassium chloride  40 mEq Oral Q4H  . sodium chloride flush  3 mL Intravenous Q12H  . sucralfate  1  g Oral TID WC & HS  . thiamine  100 mg Oral Daily   Or  . thiamine  100 mg Intravenous Daily    Consultants:  None  Procedures:  None   Antibiotics:   Anti-infectives (From admission, onward)   None       Objective   Vitals:   06/19/19 2004 06/20/19 1334 06/20/19 2046 06/21/19 0618  BP: (!) 166/96 (!) 183/90 140/78 (!) 168/87  Pulse:  (!) 56 (!) 57 70  Resp:  15 18   Temp: 97.6 F (36.4 C) 98.1 F (36.7 C) 98.6 F (37 C) 98.4 F (36.9 C)  TempSrc: Oral Oral Oral Oral  SpO2:  97% 98% 98%    Intake/Output Summary (Last 24 hours) at 06/21/2019 1322 Last  data filed at 06/21/2019 1001 Gross per 24 hour  Intake 910 ml  Output 1250 ml  Net -340 ml   There were no vitals filed for this visit.   Physical Examination:    General: Appears in no acute distress  Cardiovascular: S1-S2, regular  Respiratory: Clear to auscultation bilaterally  Abdomen: Abdomen is soft, nontender, no organomegaly  Extremities: No edema in the lower extremities  Neurologic: Alert, oriented x3, intact insight and judgment     Data Reviewed: I have personally reviewed following labs and imaging studies   Recent Results (from the past 240 hour(s))  SARS CORONAVIRUS 2 (TAT 6-24 HRS) Nasopharyngeal Nasopharyngeal Swab     Status: None   Collection Time: 06/19/19  4:28 AM   Specimen: Nasopharyngeal Swab  Result Value Ref Range Status   SARS Coronavirus 2 NEGATIVE NEGATIVE Final    Comment: (NOTE) SARS-CoV-2 target nucleic acids are NOT DETECTED. The SARS-CoV-2 RNA is generally detectable in upper and lower respiratory specimens during the acute phase of infection. Negative results do not preclude SARS-CoV-2 infection, do not rule out co-infections with other pathogens, and should not be used as the sole basis for treatment or other patient management decisions. Negative results must be combined with clinical observations, patient history, and epidemiological information. The expected result is Negative. Fact Sheet for Patients: SugarRoll.be Fact Sheet for Healthcare Providers: https://www.woods-mathews.com/ This test is not yet approved or cleared by the [place] FDA and  has been authorized for detection and/or diagnosis of SARS-CoV-2 by FDA under an Emergency Use Authorization (EUA). This EUA will remain  in effect (meaning this test can be used) for the duration of the COVID-19 declaration under Section 56 4(b)(1) of the Act, 21 U.S.C. section 360bbb-3(b)(1), unless the authorization is terminated  or revoked sooner. Performed at [facility], [place] [address] [place], [place] [address]      Liver Function Tests: Recent Labs  Lab 06/19/19 0017 06/20/19 0148 06/21/19 0200  AST 24 14* 16  ALT 17 12 13   ALKPHOS 62 Todd Todd  BILITOT 0.5 0.6 0.5  PROT 7.1 5.1* 5.4*  ALBUMIN 3.0* 2.2* 2.4*   Recent Labs  Lab 06/19/19 0017  LIPASE 76*   No results for input(s): AMMONIA in the last 168 hours.  Cardiac Enzymes: No results for input(s): CKTOTAL, CKMB, CKMBINDEX, TROPONINI in the last 168 hours. BNP (last 3 results) Recent Labs    07/13/18 2219 07/25/18 0021  BNP 146.6* 203.4*    ProBNP (last 3 results) No results for input(s): PROBNP in the last 8760 hours.    Studies: Dg Abd Portable 2v  Result Date: 06/20/2019 CLINICAL DATA:  Upper abdominal pain 3 weeks. Assess for small bowel obstruction.  EXAM: PORTABLE ABDOMEN - 2 VIEW COMPARISON:  CT 01/09/2019 and chest x-ray 07/24/2018 FINDINGS: Supine AP view and right lateral decubitus views were obtained. There are a few air-filled nondilated small bowel loops over the right mid to lower abdomen and left mid to upper abdomen. No significant air-fluid levels. No evidence of free peritoneal air. Air and stool present over the rectosigmoid colon. Surgical clips over the right upper quadrant. Multiple surgical clips over the left mid to lower abdomen likely due to previous abdominal wall hernia repair. Degenerative change of the spine and hips. IMPRESSION: Nonspecific, nonobstructive bowel gas pattern. Electronically Signed   By: Marin Olp M.D.   On: 06/20/2019 07:26     Admission status: Inpatient: Based on patients clinical presentation and evaluation of above clinical data, I have made determination that patient meets Inpatient criteria at this time.   [facility] Pager [phone]. If 7PM-7AM, please contact night-coverage at [email or web address], Office  [phone]  password [email or web address]  06/21/2019,  1:22 PM  LOS: 2 days

## 2019-06-22 LAB — COMPREHENSIVE METABOLIC PANEL
ALT: 14 U/L (ref 0–44)
AST: 15 U/L (ref 15–41)
Albumin: 2.5 g/dL — ABNORMAL LOW (ref 3.5–5.0)
Alkaline Phosphatase: 51 U/L (ref 38–126)
Anion gap: 7 (ref 5–15)
BUN: <5 mg/dL — ABNORMAL LOW (ref 8–23)
CO2: 22 mmol/L (ref 22–32)
Calcium: 8.7 mg/dL — ABNORMAL LOW (ref 8.9–10.3)
Chloride: 111 mmol/L (ref 98–111)
Creatinine, Ser: 1.03 mg/dL (ref 0.61–1.24)
GFR calc Af Amer: >60 mL/min (ref >60)
GFR calc non Af Amer: >60 mL/min (ref >60)
Glucose, Bld: 98 mg/dL (ref 70–99)
Potassium: 4.3 mmol/L (ref 3.5–5.1)
Sodium: 140 mmol/L (ref 135–145)
Total Bilirubin: 0.8 mg/dL (ref 0.3–1.2)
Total Protein: 5.5 g/dL — ABNORMAL LOW (ref 6.5–8.1)

## 2019-06-22 LAB — CBC
HCT: 36.7 % — ABNORMAL LOW (ref 39.0–52.0)
Hemoglobin: 12.1 g/dL — ABNORMAL LOW (ref 13.0–17.0)
MCH: 31.1 pg (ref 26.0–34.0)
MCHC: 33 g/dL (ref 30.0–36.0)
MCV: 94.3 fL (ref 80.0–100.0)
Platelets: 240 10*3/uL (ref 150–400)
RBC: 3.89 MIL/uL — ABNORMAL LOW (ref 4.22–5.81)
RDW: 13.8 % (ref 11.5–15.5)
WBC: 4.9 10*3/uL (ref 4.0–10.5)
nRBC: 0 % (ref 0.0–0.2)

## 2019-06-22 LAB — GLUCOSE, CAPILLARY
Glucose-Capillary: 83 mg/dL (ref 70–99)
Glucose-Capillary: 86 mg/dL (ref 70–99)
Glucose-Capillary: 112 mg/dL — ABNORMAL HIGH (ref 70–99)
Glucose-Capillary: 109 mg/dL — ABNORMAL HIGH (ref 70–99)

## 2019-06-22 MED ORDER — PANTOPRAZOLE SODIUM 40 MG IV SOLR
40.0000 mg | Freq: Two times a day (BID) | INTRAVENOUS | Status: DC
  Administered 2019-06-22 – 2019-06-28 (×13): 40 mg via INTRAVENOUS
  Filled 2019-06-22 (×13): qty 40

## 2019-06-22 MED ORDER — LIDOCAINE VISCOUS HCL 2 % MT SOLN
15.0000 mL | Freq: Once | OROMUCOSAL | Status: AC
  Administered 2019-06-22: 15 mL via ORAL
  Filled 2019-06-22: qty 15

## 2019-06-22 MED ORDER — ALUM & MAG HYDROXIDE-SIMETH 200-200-20 MG/5ML PO SUSP
30.0000 mL | Freq: Once | ORAL | Status: AC
  Administered 2019-06-22: 30 mL via ORAL
  Filled 2019-06-22: qty 30

## 2019-06-22 NOTE — Progress Notes (Signed)
[facility]  PROGRESS NOTE  Todd [name]. OU:[id number] DOB: 1955-11-25 DOA: 06/18/2019 PCP: [name], MD   Brief HPI:   63 year old male with a history of polysubstance abuse, EtOH, cocaine, heroin, paroxysmal atrial fibrillation/SVT, history of DVT and pulmonary embolism, not on anticoagulation, diabetes mellitus type 2, history of esophagitis and gastritis, hypertension, ascending aortic aneurysm came to ED with 2 days history of abdominal pain with nausea and vomiting.  He also had mildly elevated lipase and CT abdomen/pelvis showed partial distal SBO with esophagitis.    Subjective   Patient seen and examined, continues to have abdominal pain.  Started on Carafate yesterday.  Etiology was consulted, Dr. Paulita Fujita to see patient today.   Assessment/Plan:     1. Partial small bowel obstruction-patient has history of abdominal hernia repair, likely has adhesions.  SBO resolved, x-ray abdomen showed nonspecific nonobstructive bowel gas pattern.  2. Abdominal pain/gastritis/esophagitis-patient continues to have abdominal pain.  Started on  Carafate 1 g,  3 times daily with meals.  Diet has been changed to full liquid diet.  Patient had EGD in May for severe dysphagia and odynophagia which showed severe esophagitis with ulceration and atypia.  Continue Protonix 40 mg p.o. twice daily biopsy was negative for infection or malignancy.  Continue IV morphine 2 mg every 3 hours as needed for pain.  Will await further GI recommendations.  3. Acute pancreatitis-likely alcohol induced, patient also has epigastric pain with elevated lipase.  Continue IV normal saline, pain control with morphine as needed. Continue thiamine, folate.  4. History of alcohol abuse-started on CIWA protocol, thiamine and folate. No signs and symptoms of alcohol withdrawal at this time.  5. Hypertension-blood pressure is stable. Cozaar and Lopressor are currently on  hold.  6. Hypokalemia-replete.       CBG: Recent Labs  Lab 06/21/19 0751 06/21/19 1216 06/21/19 1708 06/21/19 2101 06/22/19 0812  GLUCAP 83 91 94 73 83    CBC: Recent Labs  Lab 06/18/19 1938 06/20/19 0148 06/21/19 0200 06/22/19 0822  WBC 6.4 5.6 5.3 4.9  HGB 13.9 10.7* 10.8* 12.1*  HCT 42.3 32.3* 32.7* 36.7*  MCV 94.8 95.0 94.2 94.3  PLT 349 253 246 [id number]    Basic Metabolic Panel: Recent Labs  Lab 06/18/19 1938 06/19/19 0913 06/20/19 0148 06/21/19 0200 06/22/19 0822  NA 139 137 141 137 140  K 3.3* 4.0 4.0 2.9* 4.3  CL 105 111 116* 106 111  CO2 21* 20* 18* 21* 22  GLUCOSE 107* 106* 83 124* 98  BUN 11 10 11  5* <5*  CREATININE 1.04 0.98 0.91 0.84 1.03  CALCIUM 9.2 8.0* 7.9* 8.0* 8.7*  MG  --  1.6*  --   --   --      DVT prophylaxis: Lovenox  Code Status: Full code  Family Communication: No family at bedside  Disposition Plan: likely home when medically ready for discharge        BMI  Estimated body mass index is 34.44 kg/m as calculated from the following:   Height as of 04/03/19: 5\' 9"  (1.753 m).   Weight as of 04/03/19: 105.8 kg.  Scheduled medications:  . enoxaparin (LOVENOX) injection  40 mg Subcutaneous Q24H  . LORazepam  0-4 mg Intravenous Q12H   Or  . LORazepam  0-4 mg Oral Q12H  . nicotine  21 mg Transdermal Daily  . pantoprazole  40 mg Oral BID  . sodium chloride flush  3 mL Intravenous Q12H  . sucralfate  1 g  Oral TID WC & HS  . thiamine  100 mg Oral Daily   Or  . thiamine  100 mg Intravenous Daily    Consultants:  None  Procedures:  None   Antibiotics:   Anti-infectives (From admission, onward)   None       Objective   Vitals:   06/20/19 2046 06/21/19 0618 06/21/19 1454 06/21/19 2101  BP: 140/78 (!) 168/87 (!) 149/86 (!) 139/92  Pulse: (!) 57 70 68 (!) 59  Resp: 18  18 20   Temp: 98.6 F (37 C) 98.4 F (36.9 C) 97.9 F (36.6 C) 98.8 F (37.1 C)  TempSrc: Oral Oral Oral Oral  SpO2: 98% 98% 99% 97%     Intake/Output Summary (Last 24 hours) at 06/22/2019 1041 Last data filed at 06/22/2019 0441 Gross per 24 hour  Intake 480 ml  Output 1026 ml  Net -546 ml   There were no vitals filed for this visit.   Physical Examination:   General-appears in no acute distress Heart-S1-S2, regular, no murmur auscultated Lungs-clear to auscultation bilaterally, no wheezing or crackles auscultated Abdomen-epigastric tenderness to palpation Extremities-no edema in the lower extremities Neuro-alert, oriented x3, no focal deficit noted    Data Reviewed: I have personally reviewed following labs and imaging studies   Recent Results (from the past 240 hour(s))  SARS CORONAVIRUS 2 (TAT 6-24 HRS) Nasopharyngeal Nasopharyngeal Swab     Status: None   Collection Time: 06/19/19  4:28 AM   Specimen: Nasopharyngeal Swab  Result Value Ref Range Status   SARS Coronavirus 2 NEGATIVE NEGATIVE Final    Comment: (NOTE) SARS-CoV-2 target nucleic acids are NOT DETECTED. The SARS-CoV-2 RNA is generally detectable in upper and lower respiratory specimens during the acute phase of infection. Negative results do not preclude SARS-CoV-2 infection, do not rule out co-infections with other pathogens, and should not be used as the sole basis for treatment or other patient management decisions. Negative results must be combined with clinical observations, patient history, and epidemiological information. The expected result is Negative. Fact Sheet for Patients: SugarRoll.be Fact Sheet for Healthcare Providers: https://www.woods-mathews.com/ This test is not yet approved or cleared by the [place] FDA and  has been authorized for detection and/or diagnosis of SARS-CoV-2 by FDA under an Emergency Use Authorization (EUA). This EUA will remain  in effect (meaning this test can be used) for the duration of the COVID-19 declaration under Section 56 4(b)(1) of the Act, 21  U.S.C. section 360bbb-3(b)(1), unless the authorization is terminated or revoked sooner. Performed at [facility], [place] [address], [place], [place] [address]      Liver Function Tests: Recent Labs  Lab 06/19/19 0017 06/20/19 0148 06/21/19 0200 06/22/19 0822  AST 24 14* 16 15  ALT 17 12 13 14   ALKPHOS 62 49 49 51  BILITOT 0.5 0.6 0.5 0.8  PROT 7.1 5.1* 5.4* 5.5*  ALBUMIN 3.0* 2.2* 2.4* 2.5*   Recent Labs  Lab 06/19/19 0017  LIPASE 76*   No results for input(s): AMMONIA in the last 168 hours.  Cardiac Enzymes: No results for input(s): CKTOTAL, CKMB, CKMBINDEX, TROPONINI in the last 168 hours. BNP (last 3 results) Recent Labs    07/13/18 2219 07/25/18 0021  BNP 146.6* 203.4*    ProBNP (last 3 results) No results for input(s): PROBNP in the last 8760 hours.    Studies: No results found.   Admission status: Inpatient: Based on patients clinical presentation and evaluation of above clinical data, I have  made determination that patient meets Inpatient criteria at this time.   [facility] Pager [phone]. If 7PM-7AM, please contact night-coverage at [email or web address], Office  [phone]  password [email or web address]  06/22/2019, 10:41 AM  LOS: 3 days

## 2019-06-22 NOTE — Plan of Care (Signed)
  Problem: Education: Goal: Knowledge of General Education information will improve Description: Including pain rating scale, medication(s)/side effects and non-pharmacologic comfort measures 06/22/2019 0222 by Keturah Shavers, RN Outcome: Progressing 06/22/2019 0221 by Keturah Shavers, RN Outcome: Progressing   Problem: Health Behavior/Discharge Planning: Goal: Ability to manage health-related needs will improve 06/22/2019 0222 by Keturah Shavers, RN Outcome: Progressing 06/22/2019 0221 by Keturah Shavers, RN Outcome: Progressing   Problem: Clinical Measurements: Goal: Ability to maintain clinical measurements within normal limits will improve Outcome: Progressing Goal: Will remain free from infection 06/22/2019 0222 by Keturah Shavers, RN Outcome: Progressing 06/22/2019 0221 by Keturah Shavers, RN Outcome: Progressing Goal: Diagnostic test results will improve Outcome: Progressing Goal: Respiratory complications will improve Outcome: Progressing Goal: Cardiovascular complication will be avoided Outcome: Progressing   Problem: Activity: Goal: Risk for activity intolerance will decrease Outcome: Progressing

## 2019-06-22 NOTE — H&P (View-Only) (Signed)
Referring Provider:  [place] Primary Care Physician:  [name], MD Primary Gastroenterologist:  Sadie Haber Primary   Reason for Consultation: Abdominal pain, ileus versus partial small bowel obstruction  HPI: Todd [name]. is a 63 y.o. male with past medical history of polysubstance abuse with history of cocaine and heroin use, paroxysmal atrial fibrillation, history of DVT and PE not on anticoagulation presented to the hospital with abdominal pain, nausea and vomiting.  Upon initial evaluation, he had normal CBC, normal LFTs and mild elevated lipase at 76.  Covid negative.  CT abdomen pelvis with IV contrast on June 19, 2019 showed dilated small bowel with air-fluid level concerning for distal partial small bowel obstruction.  It also showed persistent distal esophageal wall thickening compatible with esophagitis.  Cholecystectomy noted. Normal Pancreas.   Patient seen and examined at bedside.  According to patient he has been having trouble swallowing food and also having pain while swallowing solid or liquid food.  Few days ago he started noticing nausea and vomiting.  Denies any blood in the vomiting.  Passing gas.  Last bowel movement yesterday.  Denies any diarrhea or constipation.  Normal screening colonoscopy in July 2015 by Dr. Wynetta Emery.  EGD in May 2020 for evaluation of noncardiac chest pain by Dr. Cristina Gong showed severe esophagitis as well as inflammatory nodule or gastritis in the prepyloric area.  Past Medical History:  Diagnosis Date  . Arthritis    "fingers" (07/14/2018)  . Depression   . DVT (deep venous thrombosis) (HCC) LLE  . Hepatitis C     finished harvoni tx ~ 2017  . Hypercholesterolemia   . Hypertension   . Prostate cancer ([place]) 26yrs ago  . Pulmonary embolism and infarction ([place]) 07/13/2018  . Sleep apnea    not currently using cpap, mask causing vertigo  . Type II diabetes mellitus ([place])     Past Surgical History:  Procedure Laterality Date  . BIOPSY   01/12/2019   Procedure: BIOPSY;  Surgeon: Ronald Lobo, MD;  Location: [place];  Service: Endoscopy;;  . COLONOSCOPY WITH PROPOFOL N/A 02/19/2014   Procedure: COLONOSCOPY WITH PROPOFOL;  Surgeon: Garlan Fair, MD;  Location: [facility]COPY;  Service: Endoscopy;  Laterality: N/A;  . ENDOVENOUS ABLATION SAPHENOUS VEIN W/ LASER Left 11/22/2017   endovenous laser ablation L SSV by Tinnie Gens MD   . ESOPHAGOGASTRODUODENOSCOPY (EGD) WITH PROPOFOL N/A 01/12/2019   Procedure: ESOPHAGOGASTRODUODENOSCOPY (EGD) WITH PROPOFOL;  Surgeon: Ronald Lobo, MD;  Location: [facility] ENDOSCOPY;  Service: Endoscopy;  Laterality: N/A;  Patient is also scheduled for barium swallow; please notify radiology after patient's EGD is complete so that barium swallow follows the endoscopy, not vice versa  . PROSTATECTOMY  2008  . REPAIR QUADRICEPS / HAMSTRING MUSCLE Right     Prior to Admission medications   Medication Sig Start Date End Date Taking? Authorizing Provider  atorvastatin (LIPITOR) 10 MG tablet Take 1 tablet (10 mg total) by mouth daily at 6 PM. Patient not taking: Reported on 06/19/2019 04/06/19 05/06/19  Barb Merino, MD  loperamide (IMODIUM) 2 MG capsule Take 2 capsules (4 mg total) by mouth every 6 (six) hours as needed for diarrhea or loose stools. Patient not taking: Reported on 06/19/2019 04/06/19   Barb Merino, MD  losartan (COZAAR) 100 MG tablet Take 1 tablet (100 mg total) by mouth daily. Patient not taking: Reported on 06/19/2019 04/07/19 06/18/28  Barb Merino, MD  metoprolol tartrate (LOPRESSOR) 25 MG tablet Take 1 tablet (25 mg total) by mouth 2 (two)  times daily. Patient not taking: Reported on 06/19/2019 04/06/19 05/06/19  Barb Merino, MD  omeprazole (PRILOSEC) 20 MG capsule Take 1 capsule (20 mg total) by mouth 2 (two) times daily before a meal. Patient not taking: Reported on 06/19/2019 04/06/19 05/06/19  Barb Merino, MD  potassium chloride (K-DUR) 10 MEQ tablet Take 1 tablet (10 mEq total)  by mouth daily for 7 days. Patient not taking: Reported on 06/19/2019 04/06/19 04/13/19  Barb Merino, MD  traZODone (DESYREL) 100 MG tablet Take 1 tablet (100 mg total) by mouth at bedtime. Patient not taking: Reported on 06/19/2019 07/04/18   Pennelope Bracken, MD    Scheduled Meds: . enoxaparin (LOVENOX) injection  40 mg Subcutaneous Q24H  . LORazepam  0-4 mg Intravenous Q12H   Or  . LORazepam  0-4 mg Oral Q12H  . nicotine  21 mg Transdermal Daily  . pantoprazole  40 mg Oral BID  . sodium chloride flush  3 mL Intravenous Q12H  . sucralfate  1 g Oral TID WC & HS  . thiamine  100 mg Oral Daily   Or  . thiamine  100 mg Intravenous Daily   Continuous Infusions: . sodium chloride     PRN Meds:.sodium chloride, labetalol, morphine injection, sodium chloride flush  Allergies as of 06/18/2019  . (No Known Allergies)    Family History  Problem Relation Age of Onset  . Cancer Father        PROSTATE    Social History   Socioeconomic History  . Marital status: Single    Spouse name: Not on file  . Number of children: 2  . Years of education: 12th  . Highest education level: Not on file  Occupational History  . Occupation: post office  Social Needs  . Financial resource strain: Not on file  . Food insecurity    Worry: Not on file    Inability: Not on file  . Transportation needs    Medical: Not on file    Non-medical: Not on file  Tobacco Use  . Smoking status: Current Every Day Smoker    Packs/day: 0.12    Years: 45.00    Pack years: 5.40  . Smokeless tobacco: Never Used  Substance and Sexual Activity  . Alcohol use: Yes    Comment: 07/14/2018 "40oz beer/day"  . 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 Sexual activity: Not Currently  Lifestyle  . Physical activity    Days per week: Not on file    Minutes per session: Not on file  . Stress: Not on file  Relationships  . Social Herbalist on phone: Not on file    Gets together:  Not on file    Attends religious service: Not on file    Active member of club or organization: Not on file    Attends meetings of clubs or organizations: Not on file    Relationship status: Not on file  . Intimate partner violence    Fear of current or ex partner: Not on file    Emotionally abused: Not on file    Physically abused: Not on file    Forced sexual activity: Not on file  Other Topics Concern  . Not on file  Social History Narrative   ** Merged History Encounter **       Patient lives at home alone.Marland KitchenMarland KitchenDrinks coffee daily     Review of Systems: Review of Systems  Constitutional: Negative for  chills and fever.  HENT: Negative for hearing loss and tinnitus.   Eyes: Negative for blurred vision and double vision.  Respiratory: Negative for cough and hemoptysis.   Cardiovascular: Positive for chest pain. Negative for palpitations.  Gastrointestinal: Positive for abdominal pain, heartburn, nausea and vomiting. Negative for blood in stool, constipation and diarrhea.  Genitourinary: Negative for dysuria and urgency.  Musculoskeletal: Negative for myalgias and neck pain.  Skin: Negative for itching and rash.  Neurological: Negative for seizures and loss of consciousness.  Endo/Heme/Allergies: Does not bruise/bleed easily.  Psychiatric/Behavioral: Positive for substance abuse. The patient is nervous/anxious.     Physical Exam: Vital signs: Vitals:   06/21/19 1454 06/21/19 2101  BP: (!) 149/86 (!) 139/92  Pulse: 68 (!) 59  Resp: 18 20  Temp: 97.9 F (36.6 C) 98.8 F (37.1 C)  SpO2: 99% 97%   Last BM Date: 06/21/19 Physical Exam  Constitutional: He appears well-developed and well-nourished. No distress.  HENT:  Head: Normocephalic and atraumatic.  Mouth/Throat: Oropharynx is clear and moist. No oropharyngeal exudate.  Eyes: EOM are normal. No scleral icterus.  Neck: Normal range of motion. Neck supple.  Cardiovascular: Normal rate, regular rhythm and normal heart  sounds.  Pulmonary/Chest: Effort normal and breath sounds normal. No respiratory distress.  Abdominal: Soft. Bowel sounds are normal. He exhibits distension. There is no abdominal tenderness. There is no rebound and no guarding.  Mild abdominal distention noted  Musculoskeletal: Normal range of motion.        General: No edema.  Neurological: He is alert. No cranial nerve deficit.  Skin: Skin is warm. No erythema.  Psychiatric: He has a normal mood and affect. Judgment and thought content normal.  Vitals reviewed.   GI:  Lab Results: Recent Labs    06/20/19 0148 06/21/19 0200 06/22/19 0822  WBC 5.6 5.3 4.9  HGB 10.7* 10.8* 12.1*  HCT 32.3* 32.7* 36.7*  PLT 253 246 240   BMET Recent Labs    06/20/19 0148 06/21/19 0200 06/22/19 0822  NA 141 137 140  K 4.0 2.9* 4.3  CL 116* 106 111  CO2 18* 21* 22  GLUCOSE 83 124* 98  BUN 11 5* <5*  CREATININE 0.91 0.84 1.03  CALCIUM 7.9* 8.0* 8.7*   LFT Recent Labs    06/22/19 0822  PROT 5.5*  ALBUMIN 2.5*  AST 15  ALT 14  ALKPHOS 51  BILITOT 0.8   PT/INR No results for input(s): LABPROT, INR in the last 72 hours.   Studies/Results: No results found.  Impression/Plan: -Severe odynophagia with history of esophagitis.  CT abdomen pelvis on admission again showed thickening of the esophagus consistent with esophagitis. -Epigastric abdominal pain.  Not consistent with a pancreatitis.  Minimally elevated lipase.  CT negative for pancreatitis.  Could be from severe esophagitis. -Partial small bowel obstruction/ileus.  Resolved.  Recommendations --------------------------- -Plan for EGD tomorrow.  -Continue twice daily PPI and Carafate.  Continue liquid diet for today.  N.p.o. past midnight.  Risks (bleeding, infection, bowel perforation that could require surgery, sedation-related changes in cardiopulmonary systems), benefits (identification and possible treatment of source of symptoms, exclusion of certain causes of  symptoms), and alternatives (watchful waiting, radiographic imaging studies, empiric medical treatment)  were explained to patient  in detail and patient wishes to proceed.    LOS: 3 days   Otis Brace  MD, FACP 06/22/2019, 11:25 AM  Contact #  [phone]

## 2019-06-22 NOTE — Care Management Important Message (Signed)
Important Message  Patient Details  Name: Todd [name]. MRN: [id number] Date of Birth: June 20, 1956   Medicare Important Message Given:  Yes     Shelda Altes 06/22/2019, 3:23 PM

## 2019-06-22 NOTE — Consult Note (Signed)
Referring Provider:  [place] Primary Care Physician:  [name], MD Primary Gastroenterologist:  Sadie Haber Primary   Reason for Consultation: Abdominal pain, ileus versus partial small bowel obstruction  HPI: Todd [name]. is a 63 y.o. male with past medical history of polysubstance abuse with history of cocaine and heroin use, paroxysmal atrial fibrillation, history of DVT and PE not on anticoagulation presented to the hospital with abdominal pain, nausea and vomiting.  Upon initial evaluation, he had normal CBC, normal LFTs and mild elevated lipase at 76.  Covid negative.  CT abdomen pelvis with IV contrast on June 19, 2019 showed dilated small bowel with air-fluid level concerning for distal partial small bowel obstruction.  It also showed persistent distal esophageal wall thickening compatible with esophagitis.  Cholecystectomy noted. Normal Pancreas.   Patient seen and examined at bedside.  According to patient he has been having trouble swallowing food and also having pain while swallowing solid or liquid food.  Few days ago he started noticing nausea and vomiting.  Denies any blood in the vomiting.  Passing gas.  Last bowel movement yesterday.  Denies any diarrhea or constipation.  Normal screening colonoscopy in July 2015 by Dr. Wynetta Emery.  EGD in May 2020 for evaluation of noncardiac chest pain by Dr. Cristina Gong showed severe esophagitis as well as inflammatory nodule or gastritis in the prepyloric area.  Past Medical History:  Diagnosis Date  . Arthritis    "fingers" (07/14/2018)  . Depression   . DVT (deep venous thrombosis) (HCC) LLE  . Hepatitis C     finished harvoni tx ~ 2017  . Hypercholesterolemia   . Hypertension   . Prostate cancer ([place]) 77yrs ago  . Pulmonary embolism and infarction ([place]) 07/13/2018  . Sleep apnea    not currently using cpap, mask causing vertigo  . Type II diabetes mellitus ([place])     Past Surgical History:  Procedure Laterality Date  . BIOPSY   01/12/2019   Procedure: BIOPSY;  Surgeon: Ronald Lobo, MD;  Location: [place];  Service: Endoscopy;;  . COLONOSCOPY WITH PROPOFOL N/A 02/19/2014   Procedure: COLONOSCOPY WITH PROPOFOL;  Surgeon: Garlan Fair, MD;  Location: [facility] ENDOSCOPY;  Service: Endoscopy;  Laterality: N/A;  . ENDOVENOUS ABLATION SAPHENOUS VEIN W/ LASER Left 11/22/2017   endovenous laser ablation L SSV by Tinnie Gens MD   . ESOPHAGOGASTRODUODENOSCOPY (EGD) WITH PROPOFOL N/A 01/12/2019   Procedure: ESOPHAGOGASTRODUODENOSCOPY (EGD) WITH PROPOFOL;  Surgeon: Ronald Lobo, MD;  Location: [facility] ENDOSCOPY;  Service: Endoscopy;  Laterality: N/A;  Patient is also scheduled for barium swallow; please notify radiology after patient's EGD is complete so that barium swallow follows the endoscopy, not vice versa  . PROSTATECTOMY  2008  . REPAIR QUADRICEPS / HAMSTRING MUSCLE Right     Prior to Admission medications   Medication Sig Start Date End Date Taking? Authorizing Provider  atorvastatin (LIPITOR) 10 MG tablet Take 1 tablet (10 mg total) by mouth daily at 6 PM. Patient not taking: Reported on 06/19/2019 04/06/19 05/06/19  Barb Merino, MD  loperamide (IMODIUM) 2 MG capsule Take 2 capsules (4 mg total) by mouth every 6 (six) hours as needed for diarrhea or loose stools. Patient not taking: Reported on 06/19/2019 04/06/19   Barb Merino, MD  losartan (COZAAR) 100 MG tablet Take 1 tablet (100 mg total) by mouth daily. Patient not taking: Reported on 06/19/2019 04/07/19 06/18/28  Barb Merino, MD  metoprolol tartrate (LOPRESSOR) 25 MG tablet Take 1 tablet (25 mg total) by mouth 2 (two)  times daily. Patient not taking: Reported on 06/19/2019 04/06/19 05/06/19  Barb Merino, MD  omeprazole (PRILOSEC) 20 MG capsule Take 1 capsule (20 mg total) by mouth 2 (two) times daily before a meal. Patient not taking: Reported on 06/19/2019 04/06/19 05/06/19  Barb Merino, MD  potassium chloride (K-DUR) 10 MEQ tablet Take 1 tablet (10 mEq total)  by mouth daily for 7 days. Patient not taking: Reported on 06/19/2019 04/06/19 04/13/19  Barb Merino, MD  traZODone (DESYREL) 100 MG tablet Take 1 tablet (100 mg total) by mouth at bedtime. Patient not taking: Reported on 06/19/2019 07/04/18   Pennelope Bracken, MD    Scheduled Meds: . enoxaparin (LOVENOX) injection  40 mg Subcutaneous Q24H  . LORazepam  0-4 mg Intravenous Q12H   Or  . LORazepam  0-4 mg Oral Q12H  . nicotine  21 mg Transdermal Daily  . pantoprazole  40 mg Oral BID  . sodium chloride flush  3 mL Intravenous Q12H  . sucralfate  1 g Oral TID WC & HS  . thiamine  100 mg Oral Daily   Or  . thiamine  100 mg Intravenous Daily   Continuous Infusions: . sodium chloride     PRN Meds:.sodium chloride, labetalol, morphine injection, sodium chloride flush  Allergies as of 06/18/2019  . (No Known Allergies)    Family History  Problem Relation Age of Onset  . Cancer Father        PROSTATE    Social History   Socioeconomic History  . Marital status: Single    Spouse name: Not on file  . Number of children: 2  . Years of education: 12th  . Highest education level: Not on file  Occupational History  . Occupation: post office  Social Needs  . Financial resource strain: Not on file  . Food insecurity    Worry: Not on file    Inability: Not on file  . Transportation needs    Medical: Not on file    Non-medical: Not on file  Tobacco Use  . Smoking status: Current Every Day Smoker    Packs/day: 0.12    Years: 45.00    Pack years: 5.40  . Smokeless tobacco: Never Used  Substance and Sexual Activity  . Alcohol use: Yes    Comment: 07/14/2018 "40oz beer/day"  . 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 Sexual activity: Not Currently  Lifestyle  . Physical activity    Days per week: Not on file    Minutes per session: Not on file  . Stress: Not on file  Relationships  . Social Herbalist on phone: Not on file    Gets together:  Not on file    Attends religious service: Not on file    Active member of club or organization: Not on file    Attends meetings of clubs or organizations: Not on file    Relationship status: Not on file  . Intimate partner violence    Fear of current or ex partner: Not on file    Emotionally abused: Not on file    Physically abused: Not on file    Forced sexual activity: Not on file  Other Topics Concern  . Not on file  Social History Narrative   ** Merged History Encounter **       Patient lives at home alone.Marland KitchenMarland KitchenDrinks coffee daily     Review of Systems: Review of Systems  Constitutional: Negative for  chills and fever.  HENT: Negative for hearing loss and tinnitus.   Eyes: Negative for blurred vision and double vision.  Respiratory: Negative for cough and hemoptysis.   Cardiovascular: Positive for chest pain. Negative for palpitations.  Gastrointestinal: Positive for abdominal pain, heartburn, nausea and vomiting. Negative for blood in stool, constipation and diarrhea.  Genitourinary: Negative for dysuria and urgency.  Musculoskeletal: Negative for myalgias and neck pain.  Skin: Negative for itching and rash.  Neurological: Negative for seizures and loss of consciousness.  Endo/Heme/Allergies: Does not bruise/bleed easily.  Psychiatric/Behavioral: Positive for substance abuse. The patient is nervous/anxious.     Physical Exam: Vital signs: Vitals:   06/21/19 1454 06/21/19 2101  BP: (!) 149/86 (!) 139/92  Pulse: 68 (!) 59  Resp: 18 20  Temp: 97.9 F (36.6 C) 98.8 F (37.1 C)  SpO2: 99% 97%   Last BM Date: 06/21/19 Physical Exam  Constitutional: He appears well-developed and well-nourished. No distress.  HENT:  Head: Normocephalic and atraumatic.  Mouth/Throat: Oropharynx is clear and moist. No oropharyngeal exudate.  Eyes: EOM are normal. No scleral icterus.  Neck: Normal range of motion. Neck supple.  Cardiovascular: Normal rate, regular rhythm and normal heart  sounds.  Pulmonary/Chest: Effort normal and breath sounds normal. No respiratory distress.  Abdominal: Soft. Bowel sounds are normal. He exhibits distension. There is no abdominal tenderness. There is no rebound and no guarding.  Mild abdominal distention noted  Musculoskeletal: Normal range of motion.        General: No edema.  Neurological: He is alert. No cranial nerve deficit.  Skin: Skin is warm. No erythema.  Psychiatric: He has a normal mood and affect. Judgment and thought content normal.  Vitals reviewed.   GI:  Lab Results: Recent Labs    06/20/19 0148 06/21/19 0200 06/22/19 0822  WBC 5.6 5.3 4.9  HGB 10.7* 10.8* 12.1*  HCT 32.3* 32.7* 36.7*  PLT 253 246 240   BMET Recent Labs    06/20/19 0148 06/21/19 0200 06/22/19 0822  NA 141 137 140  K 4.0 2.9* 4.3  CL 116* 106 111  CO2 18* 21* 22  GLUCOSE 83 124* 98  BUN 11 5* <5*  CREATININE 0.91 0.84 1.03  CALCIUM 7.9* 8.0* 8.7*   LFT Recent Labs    06/22/19 0822  PROT 5.5*  ALBUMIN 2.5*  AST 15  ALT 14  ALKPHOS 51  BILITOT 0.8   PT/INR No results for input(s): LABPROT, INR in the last 72 hours.   Studies/Results: No results found.  Impression/Plan: -Severe odynophagia with history of esophagitis.  CT abdomen pelvis on admission again showed thickening of the esophagus consistent with esophagitis. -Epigastric abdominal pain.  Not consistent with a pancreatitis.  Minimally elevated lipase.  CT negative for pancreatitis.  Could be from severe esophagitis. -Partial small bowel obstruction/ileus.  Resolved.  Recommendations --------------------------- -Plan for EGD tomorrow.  -Continue twice daily PPI and Carafate.  Continue liquid diet for today.  N.p.o. past midnight.  Risks (bleeding, infection, bowel perforation that could require surgery, sedation-related changes in cardiopulmonary systems), benefits (identification and possible treatment of source of symptoms, exclusion of certain causes of  symptoms), and alternatives (watchful waiting, radiographic imaging studies, empiric medical treatment)  were explained to patient  in detail and patient wishes to proceed.    LOS: 3 days   Otis Brace  MD, FACP 06/22/2019, 11:25 AM  Contact #  [phone]

## 2019-06-23 ENCOUNTER — Encounter (HOSPITAL_COMMUNITY)
Admission: EM | Disposition: A | Discharge status: Home / Self Care | Payer: Self-pay | Source: Home / Self Care | Attending: Family Medicine

## 2019-06-23 ENCOUNTER — Inpatient Hospital Stay (HOSPITAL_COMMUNITY): Payer: Medicare Other | Admitting: Certified Registered"

## 2019-06-23 HISTORY — PX: BIOPSY: SHX5522

## 2019-06-23 HISTORY — PX: ESOPHAGEAL BRUSHING: SHX6842

## 2019-06-23 HISTORY — PX: ESOPHAGOGASTRODUODENOSCOPY (EGD) WITH PROPOFOL: SHX5813

## 2019-06-23 LAB — GLUCOSE, CAPILLARY
Glucose-Capillary: 80 mg/dL (ref 70–99)
Glucose-Capillary: 108 mg/dL — ABNORMAL HIGH (ref 70–99)
Glucose-Capillary: 90 mg/dL (ref 70–99)
Glucose-Capillary: 108 mg/dL — ABNORMAL HIGH (ref 70–99)

## 2019-06-23 SURGERY — ESOPHAGOGASTRODUODENOSCOPY (EGD) WITH PROPOFOL
Anesthesia: Monitor Anesthesia Care

## 2019-06-23 MED ORDER — SODIUM CHLORIDE 0.9 % IV SOLN: INTRAVENOUS | Status: DC

## 2019-06-23 MED ORDER — LACTATED RINGERS IV SOLN
INTRAVENOUS | Status: DC
  Administered 2019-06-23: 09:00:00 via INTRAVENOUS

## 2019-06-23 MED ORDER — LOSARTAN POTASSIUM 50 MG PO TABS
100.0000 mg | ORAL_TABLET | Freq: Every day | ORAL | Status: DC
  Administered 2019-06-23 – 2019-06-27 (×5): 100 mg via ORAL
  Filled 2019-06-23 (×5): qty 2

## 2019-06-23 MED ORDER — PROPOFOL 500 MG/50ML IV EMUL
INTRAVENOUS | Status: DC | PRN
  Administered 2019-06-23: 100 ug/kg/min via INTRAVENOUS

## 2019-06-23 MED ORDER — PROPOFOL 10 MG/ML IV BOLUS
INTRAVENOUS | Status: DC | PRN
  Administered 2019-06-23 (×4): 20 mg via INTRAVENOUS

## 2019-06-23 MED ORDER — METOPROLOL TARTRATE 25 MG PO TABS
25.0000 mg | ORAL_TABLET | Freq: Two times a day (BID) | ORAL | Status: DC
  Administered 2019-06-23 – 2019-06-30 (×14): 25 mg via ORAL
  Filled 2019-06-23 (×13): qty 1
  Filled 2019-06-23: qty 2
  Filled 2019-06-23 (×2): qty 1

## 2019-06-23 SURGICAL SUPPLY — 15 items

## 2019-06-23 NOTE — Transfer of Care (Signed)
Immediate Anesthesia Transfer of Care Note  Patient: Kennard Fildes.  Procedure(s) Performed: ESOPHAGOGASTRODUODENOSCOPY (EGD) WITH PROPOFOL (N/A ) BIOPSY ESOPHAGEAL BRUSHING  Patient Location: Endoscopy Unit  Anesthesia Type:[name]  Level of Consciousness: awake, alert  and oriented  Airway & Oxygen Therapy: Patient Spontanous Breathing and Patient connected to nasal cannula oxygen  Post-op Assessment: Report given to RN and Post -op Vital signs reviewed and stable  Post vital signs: Reviewed and stable  Last Vitals:  Vitals Value Taken Time  BP    Temp    Pulse 71 06/23/19 1007  Resp 26 06/23/19 1007  SpO2 91 % 06/23/19 1007  Vitals shown include unvalidated device data.  Last Pain:  Vitals:   06/23/19 0833  TempSrc: Temporal  PainSc: 8       Patients Stated Pain Goal: 0 (20/25/42 7062)  Complications: No apparent anesthesia complications

## 2019-06-23 NOTE — Anesthesia Postprocedure Evaluation (Signed)
Anesthesia Post Note  Patient: Alaric Gladwin.  Procedure(s) Performed: ESOPHAGOGASTRODUODENOSCOPY (EGD) WITH PROPOFOL (N/A ) BIOPSY ESOPHAGEAL BRUSHING     Patient location during evaluation: Endoscopy Anesthesia Type: [name] Level of consciousness: awake Pain management: pain level controlled Respiratory status: spontaneous breathing Cardiovascular status: blood pressure returned to baseline Postop Assessment: no apparent nausea or vomiting Anesthetic complications: no    Last Vitals:  Vitals:   06/23/19 1040 06/23/19 1405  BP: (!) 193/100 130/84  Pulse: (!) 59 81  Resp: 14 20  Temp:  37.1 C  SpO2: 95% 99%    Last Pain:  Vitals:   06/23/19 1405  TempSrc: Oral  PainSc:                  Tadeo Besecker

## 2019-06-23 NOTE — Anesthesia Preprocedure Evaluation (Signed)
Anesthesia Evaluation  Patient identified by MRN, date of birth, ID band Patient awake    Reviewed: Allergy & Precautions, NPO status , Patient's Chart, lab work & pertinent test results  Airway Mallampati: II  TM Distance: >3 FB     Dental   Pulmonary Current Smoker,    breath sounds clear to auscultation       Cardiovascular hypertension, + dysrhythmias  Rhythm:Regular Rate:Normal     Neuro/Psych    GI/Hepatic (+) Hepatitis -History noted CG   Endo/Other  diabetes  Renal/GU Renal disease     Musculoskeletal   Abdominal   Peds  Hematology   Anesthesia Other Findings   Reproductive/Obstetrics                             Anesthesia Physical Anesthesia Plan  ASA: III  Anesthesia Plan: [name]   Post-op Pain Management:    Induction: Intravenous  PONV Risk Score and Plan: 1 and Midazolam  Airway Management Planned: Nasal Cannula and Simple Face Mask  Additional Equipment:   Intra-op Plan:   Post-operative Plan:   Informed Consent: I have reviewed the patients History and Physical, chart, labs and discussed the procedure including the risks, benefits and alternatives for the proposed anesthesia with the patient or authorized representative who has indicated his/her understanding and acceptance.     Dental advisory given  Plan Discussed with: CRNA and Anesthesiologist  Anesthesia Plan Comments:         Anesthesia Quick Evaluation

## 2019-06-23 NOTE — Progress Notes (Signed)
[facility]  PROGRESS NOTE  Todd [name]. [id number] DOB: 01-Dec-1955 DOA: 06/18/2019 PCP: [name], MD   Brief HPI:   63 year old male with a history of polysubstance abuse, EtOH, cocaine, heroin, paroxysmal atrial fibrillation/SVT, history of DVT and pulmonary embolism, not on anticoagulation, diabetes mellitus type 2, history of esophagitis and gastritis, hypertension, ascending aortic aneurysm came to ED with 2 days history of abdominal pain with nausea and vomiting.  He also had mildly elevated lipase and CT abdomen/pelvis showed partial distal SBO with esophagitis.    Subjective   Patient seen and examined, s/p EGD which again shows esophagitis.  Biopsies obtained.  Patient currently on Carafate and IV PPI.  Complains of being depressed and drinking alcohol.  He is concerned that he will start drinking alcohol once he was back to his apartment.  Wants to see a psychiatrist.   Assessment/Plan:     1. Partial small bowel obstruction-patient has history of abdominal hernia repair, likely has adhesions.  SBO resolved, x-ray abdomen showed nonspecific nonobstructive bowel gas pattern.  2. Abdominal pain/gastritis/esophagitis-patient presented  with abdominal pain, he was started on IV Protonix and Carafate 1 g 3 times daily for esophagitis.  He continues to have abdominal pain.  GI was consulted.   Patient had EGD in May for severe dysphagia and odynophagia which showed severe esophagitis with ulceration and atypia.  Patient underwent EGD again today which showed severe esophagitis, recommendation to continue IV PPI Protonix 40 mg twice daily, Carafate 1 g 3 times daily.  Continue full liquid diet.  3. Acute pancreatitis-likely alcohol induced, patient also has epigastric pain with elevated lipase.  Continue IV normal saline, pain control with morphine as needed. Continue thiamine, folate.  4. History of alcohol abuse-started on CIWA protocol, thiamine and folate. No  signs and symptoms of alcohol withdrawal at this time.  5. Depression-patient complains of depression, denies suicidal ideation.  Feels that he is going to stop drinking alcohol once he gets back to his apartment.  Will consult psych for further recommendations.  6. Hypertension-blood pressure was elevated this morning.  Will restart home medications including Cozaar and Lopressor.  7. Hypokalemia-replete.    CBG: Recent Labs  Lab 06/22/19 0812 06/22/19 1201 06/22/19 1625 06/22/19 2104 06/23/19 0741  GLUCAP 83 86 112* 109* 80    CBC: Recent Labs  Lab 06/18/19 1938 06/20/19 0148 06/21/19 0200 06/22/19 0822  WBC 6.4 5.6 5.3 4.9  HGB 13.9 10.7* 10.8* 12.1*  HCT 42.3 32.3* 32.7* 36.7*  MCV 94.8 95.0 94.2 94.3  PLT 349 253 246 [id number]    Basic Metabolic Panel: Recent Labs  Lab 06/18/19 1938 06/19/19 0913 06/20/19 0148 06/21/19 0200 06/22/19 0822  NA 139 137 141 137 140  K 3.3* 4.0 4.0 2.9* 4.3  CL 105 111 116* 106 111  CO2 21* 20* 18* 21* 22  GLUCOSE 107* 106* 83 124* 98  BUN 11 10 11  5* <5*  CREATININE 1.04 0.98 0.91 0.84 1.03  CALCIUM 9.2 8.0* 7.9* 8.0* 8.7*  MG  --  1.6*  --   --   --      DVT prophylaxis: Lovenox  Code Status: Full code  Family Communication: No family at bedside  Disposition Plan: likely home when medically ready for discharge        BMI  Estimated body mass index is 33.97 kg/m as calculated from the following:   Height as of 04/03/19: 5\' 9"  (1.753 m).   Weight as of this encounter: 104.3  kg.  Scheduled medications:  . enoxaparin (LOVENOX) injection  40 mg Subcutaneous Q24H  . nicotine  21 mg Transdermal Daily  . pantoprazole (PROTONIX) IV  40 mg Intravenous Q12H  . sodium chloride flush  3 mL Intravenous Q12H  . sucralfate  1 g Oral TID WC & HS  . thiamine  100 mg Oral Daily   Or  . thiamine  100 mg Intravenous Daily    Consultants:  None  Procedures:  None   Antibiotics:   Anti-infectives (From admission,  onward)   None       Objective   Vitals:   06/23/19 1010 06/23/19 1020 06/23/19 1030 06/23/19 1040  BP:  (!) 179/99 (!) 197/108 (!) 193/100  Pulse: 74 63 64 (!) 59  Resp: 19 (!) 21 15 14   Temp:      TempSrc:      SpO2: 95% 94% 96% 95%  Weight:        Intake/Output Summary (Last 24 hours) at 06/23/2019 1224 Last data filed at 06/23/2019 [id number] Gross per 24 hour  Intake 1280 ml  Output 650 ml  Net 630 ml   Filed Weights   06/23/19 0846  Weight: 104.3 kg     Physical Examination:  General-appears in no acute distress Heart-S1-S2, regular, no murmur auscultated Lungs-clear to auscultation bilaterally, no wheezing or crackles auscultated Abdomen-soft, positive epigastric tenderness to palpation, no organomegaly Extremities-no edema in the lower extremities Neuro-alert, oriented x3, no focal deficit noted    Data Reviewed: I have personally reviewed following labs and imaging studies   Recent Results (from the past 240 hour(s))  SARS CORONAVIRUS 2 (TAT 6-24 HRS) Nasopharyngeal Nasopharyngeal Swab     Status: None   Collection Time: 06/19/19  4:28 AM   Specimen: Nasopharyngeal Swab  Result Value Ref Range Status   SARS Coronavirus 2 NEGATIVE NEGATIVE Final    Comment: (NOTE) SARS-CoV-2 target nucleic acids are NOT DETECTED. The SARS-CoV-2 RNA is generally detectable in upper and lower respiratory specimens during the acute phase of infection. Negative results do not preclude SARS-CoV-2 infection, do not rule out co-infections with other pathogens, and should not be used as the sole basis for treatment or other patient management decisions. Negative results must be combined with clinical observations, patient history, and epidemiological information. The expected result is Negative. Fact Sheet for Patients: SugarRoll.be Fact Sheet for Healthcare Providers: https://www.woods-mathews.com/ This test is not yet approved or  cleared by the [place] FDA and  has been authorized for detection and/or diagnosis of SARS-CoV-2 by FDA under an Emergency Use Authorization (EUA). This EUA will remain  in effect (meaning this test can be used) for the duration of the COVID-19 declaration under Section 56 4(b)(1) of the Act, 21 U.S.C. section 360bbb-3(b)(1), unless the authorization is terminated or revoked sooner. Performed at [facility], [place] [address] [place], [place] [address]      Liver Function Tests: Recent Labs  Lab 06/19/19 0017 06/20/19 0148 06/21/19 0200 06/22/19 0822  AST 24 14* 16 15  ALT 17 12 13 14   ALKPHOS 62 49 49 51  BILITOT 0.5 0.6 0.5 0.8  PROT 7.1 5.1* 5.4* 5.5*  ALBUMIN 3.0* 2.2* 2.4* 2.5*   Recent Labs  Lab 06/19/19 0017  LIPASE 76*   No results for input(s): AMMONIA in the last 168 hours.  Cardiac Enzymes: No results for input(s): CKTOTAL, CKMB, CKMBINDEX, TROPONINI in the last 168 hours. BNP (last 3 results) Recent Labs    07/13/18  2219 07/25/18 0021  BNP 146.6* 203.4*    ProBNP (last 3 results) No results for input(s): PROBNP in the last 8760 hours.    Studies: No results found.   Admission status: Inpatient: Based on patients clinical presentation and evaluation of above clinical data, I have made determination that patient meets Inpatient criteria at this time.   [facility] Pager [phone]. If 7PM-7AM, please contact night-coverage at [email or web address], Office  [phone]  password [email or web address]  06/23/2019, 12:24 PM  LOS: 4 days

## 2019-06-23 NOTE — Op Note (Signed)
[facility] Patient Name: Todd [name] Procedure Date : 06/23/2019 MRN: [id number] Attending MD: Arta Silence , MD Date of Birth: 1956-01-27 CSN: [id number] Age: 63 Admit Type: Inpatient Procedure:                Upper GI endoscopy Indications:              Epigastric abdominal pain, Odynophagia Providers:                Arta Silence, MD, Vista Lawman, RN, Laverda Sorenson, Technician, Clearnce Sorrel, CRNA Referring MD:             [facility] Medicines:                Monitored Anesthesia Care Complications:            No immediate complications. Estimated Blood Loss:     Estimated blood loss: none. Procedure:                Pre-Anesthesia Assessment:                           - Prior to the procedure, a History and Physical                            was performed, and patient medications and                            allergies were reviewed. The patient's tolerance of                            previous anesthesia was also reviewed. The risks                            and benefits of the procedure and the sedation                            options and risks were discussed with the patient.                            All questions were answered, and informed consent                            was obtained. Prior Anticoagulants: The patient has                            taken no previous anticoagulant or antiplatelet                            agents. ASA Grade Assessment: III - A patient with                            severe systemic disease. After reviewing the risks  and benefits, the patient was deemed in                            satisfactory condition to undergo the procedure.                           After obtaining informed consent, the endoscope was                            passed under direct vision. Throughout the                            procedure, the patient's blood pressure, pulse, and                             oxygen saturations were monitored continuously. The                            [id number]) Olympus gastroscope was                            introduced through the mouth, and advanced to the                            second part of duodenum. The upper GI endoscopy was                            accomplished without difficulty. The patient                            tolerated the procedure well. Scope In: Scope Out: Findings:      LA Grade D (one or more mucosal breaks involving at least 75% of       esophageal circumference) esophagitis was found diffusely and without       interruption in the mid and distal esophagus; proximal first few cm of       esophagus was normal. Biopsies were taken with a cold forceps for       histology. Brushings of the inflammatory-appearing esophageal exudate       were also taken      A small hiatal hernia was present.      Patchy mild inflammation was found in the prepyloric region of the       stomach.      The exam of the stomach was otherwise normal.      The duodenal bulb, first portion of the duodenum and second portion of       the duodenum were normal. Impression:               - LA Grade D esophagitis of mid and distal                            esophagus, with sparing of first few cm of the                            proximal esophagus. Biopsied and brushed.  Suspect                            severe reflux esophagitis (patient had prior                            endoscopy six months ago with similar findings and                            esophageal biopsies at that time showed no                            infectious etiology and he was not compliant with                            his PPI therapy).                           - Esophageal findings highly likely cause of                            patient's recurrent odynophagia and epigastric                            abdominal pain.                            - Small hiatal hernia.                           - Gastritis.                           - Normal duodenal bulb, first portion of the                            duodenum and second portion of the duodenum. Moderate Sedation:      None Recommendation:           - Return patient to hospital ward for ongoing care.                           - Full liquid diet, low acid, until further notice.                           - Continue present medications, including IV bid                            ppi and tid po sucralfate suspension.                           - Await pathology results.                           [facility] will follow. Procedure Code(s):        --- Professional ---  [id number], Esophagogastroduodenoscopy, flexible,                            transoral; with biopsy, single or multiple Diagnosis Code(s):        --- Professional ---                           K20.90, Esophagitis, unspecified without bleeding                           K44.9, Diaphragmatic hernia without obstruction or                            gangrene                           K29.70, Gastritis, unspecified, without bleeding                           R10.13, Epigastric pain                           R13.10, Dysphagia, unspecified CPT copyright 2019 American Medical Association. All rights reserved. The codes documented in this report are preliminary and upon coder review may  be revised to meet current compliance requirements. Arta Silence, MD 06/23/2019 10:09:32 AM This report has been signed electronically. Number of Addenda: 0

## 2019-06-23 NOTE — Interval H&P Note (Signed)
History and Physical Interval Note:  06/23/2019 9:37 AM  Todd [name].  has presented today for surgery, with the diagnosis of odynophagia, esophagits.  The various methods of treatment have been discussed with the patient and family. After consideration of risks, benefits and other options for treatment, the patient has consented to  Procedure(s): ESOPHAGOGASTRODUODENOSCOPY (EGD) WITH PROPOFOL (N/A) as a surgical intervention.  The patient's history has been reviewed, patient examined, no change in status, stable for surgery.  I have reviewed the patient's chart and labs.  Questions were answered to the patient's satisfaction.     Todd [name]  Assessment:  1.  Odynophagia. 2.  History of esophagitis.  Plan:  1.  Endoscopy for further evaluation. 2.  Risks (bleeding, infection, bowel perforation that could require surgery, sedation-related changes in cardiopulmonary systems), benefits (identification and possible treatment of source of symptoms, exclusion of certain causes of symptoms), and alternatives (watchful waiting, radiographic imaging studies, empiric medical treatment) of upper endoscopy (EGD) were explained to patient/family in detail and patient wishes to proceed.

## 2019-06-24 ENCOUNTER — Encounter (HOSPITAL_COMMUNITY): Payer: Self-pay | Admitting: Gastroenterology

## 2019-06-24 DIAGNOSIS — E119 Type 2 diabetes mellitus without complications: Secondary | ICD-10-CM

## 2019-06-24 DIAGNOSIS — F1114 Opioid abuse with opioid-induced mood disorder: Secondary | ICD-10-CM

## 2019-06-24 DIAGNOSIS — F1094 Alcohol use, unspecified with alcohol-induced mood disorder: Secondary | ICD-10-CM

## 2019-06-24 DIAGNOSIS — F411 Generalized anxiety disorder: Secondary | ICD-10-CM

## 2019-06-24 DIAGNOSIS — F332 Major depressive disorder, recurrent severe without psychotic features: Secondary | ICD-10-CM

## 2019-06-24 LAB — GLUCOSE, CAPILLARY
Glucose-Capillary: 95 mg/dL (ref 70–99)
Glucose-Capillary: 133 mg/dL — ABNORMAL HIGH (ref 70–99)

## 2019-06-24 MED ORDER — SERTRALINE HCL 50 MG PO TABS
25.0000 mg | ORAL_TABLET | Freq: Every day | ORAL | Status: DC
  Administered 2019-06-24 – 2019-06-30 (×7): 25 mg via ORAL
  Filled 2019-06-24 (×8): qty 1

## 2019-06-24 MED ORDER — SERTRALINE HCL 20 MG/ML PO CONC
25.0000 mg | Freq: Every day | ORAL | Status: DC
  Filled 2019-06-24: qty 1.25

## 2019-06-24 NOTE — Progress Notes (Signed)
Subjective: Ongoing epigastric pain and odynophagia.  Objective: Vital signs in last 24 hours: Temp:  [98.3 F (36.8 C)-98.4 F (36.9 C)] 98.4 F (36.9 C) (11/08 1529) Pulse Rate:  [56-60] 56 (11/08 1529) Resp:  [22] 22 (11/08 1529) BP: (157)/(90-92) 157/90 (11/08 1529) SpO2:  [96 %-99 %] 99 % (11/08 1529) Weight change:  Last BM Date: 06/21/19  PE: GEN:  NAD ABD:  Soft, mild epigastric tenderness without peritonitis  Lab Results: CBC    Component Value Date/Time   WBC 4.9 06/22/2019 0822   RBC 3.89 (L) 06/22/2019 0822   HGB 12.1 (L) 06/22/2019 0822   HCT 36.7 (L) 06/22/2019 0822   PLT 240 06/22/2019 0822   MCV 94.3 06/22/2019 0822   MCH 31.1 06/22/2019 0822   MCHC 33.0 06/22/2019 0822   RDW 13.8 06/22/2019 0822   LYMPHSABS 2.5 04/03/2019 1422   MONOABS 0.3 04/03/2019 1422   EOSABS 0.0 04/03/2019 1422   BASOSABS 0.0 04/03/2019 1422   CMP     Component Value Date/Time   NA 140 06/22/2019 0822   K 4.3 06/22/2019 0822   CL 111 06/22/2019 0822   CO2 22 06/22/2019 0822   GLUCOSE 98 06/22/2019 0822   BUN <5 (L) 06/22/2019 0822   CREATININE 1.03 06/22/2019 0822   CALCIUM 8.7 (L) 06/22/2019 0822   PROT 5.5 (L) 06/22/2019 0822   ALBUMIN 2.5 (L) 06/22/2019 0822   AST 15 06/22/2019 0822   ALT 14 06/22/2019 0822   ALKPHOS 51 06/22/2019 0822   BILITOT 0.8 06/22/2019 0822   GFRNONAA >60 06/22/2019 0822   GFRAA >60 06/22/2019 [id number]   Assessment:  1.  Epigastric pain. 2.  Odynophagia.  3.  Severe esophagitis, likely cause of #1/#2 above, repeat biopsies obtained and pending.  Plan:  1.  Soft diet. 2.  Awaiting esophageal brushings and biopsies. 3.  Continue sucralfate and PPI. 4.  [facility] will follow.   Landry Dyke 06/24/2019, 7:25 PM   Cell [phone] If no answer or after 5 PM call [phone]

## 2019-06-24 NOTE — Progress Notes (Addendum)
[facility]  PROGRESS NOTE  Todd [name]. [id number] DOB: [birth year] DOA: 06/18/2019 PCP: [name], MD   Brief HPI:   62 year old male with a history of polysubstance abuse, EtOH, cocaine, heroin, paroxysmal atrial fibrillation/SVT, history of DVT and pulmonary embolism, not on anticoagulation, diabetes mellitus type 2, history of esophagitis and gastritis, hypertension, ascending aortic aneurysm came to ED with 2 days history of abdominal pain with nausea and vomiting.  He also had mildly elevated lipase and CT abdomen/pelvis showed partial distal SBO with esophagitis.    Subjective   Patient seen and examined, still continues to have abdominal pain.  Psychiatric consultation was requested yesterday, as patient feels he is depressed and will start drinking alcohol again once he gets back to his apartment.   Assessment/Plan:     1. Partial small bowel obstruction-patient has history of abdominal hernia repair, likely has adhesions.  SBO resolved, x-ray abdomen showed nonspecific nonobstructive bowel gas pattern.  2. Abdominal pain/gastritis/esophagitis-patient presented  with abdominal pain, he was started on IV Protonix and Carafate 1 g 3 times daily for esophagitis.  He continues to have abdominal pain.  GI was consulted.   Patient had EGD in May for severe dysphagia and odynophagia which showed severe esophagitis with ulceration and atypia.  Patient underwent EGD again today which showed severe esophagitis, recommendation to continue IV PPI Protonix 40 mg twice daily, Carafate 1 g 3 times daily.  Continue full liquid diet.  Once patient is able to tolerate soft diet, he can be discharged home.  3. Acute pancreatitis-likely alcohol induced, patient also has epigastric pain with elevated lipase.  Continue IV normal saline, pain control with morphine as needed. Continue thiamine, folate.  4. History of alcohol abuse-started on CIWA protocol, thiamine and folate. No signs  and symptoms of alcohol withdrawal at this time.  5. ? Depression-patient complains of symptoms of depression, denies suicidal ideation.  Feels that he is going to start drinking alcohol once he gets back to his apartment.  Psychiatry has been consulted, await their recommendations.    6. Hypertension-blood pressure was elevated.  Restarted home medications including Cozaar and Lopressor.  7. Hypokalemia-replete.    CBG: Recent Labs  Lab 06/22/19 2104 06/23/19 0741 06/23/19 1252 06/23/19 1608 06/23/19 2104  GLUCAP 109* 80 108* 90 108*    CBC: Recent Labs  Lab 06/18/19 1938 06/20/19 0148 06/21/19 0200 06/22/19 0822  WBC 6.4 5.6 5.3 4.9  HGB 13.9 10.7* 10.8* 12.1*  HCT 42.3 32.3* 32.7* 36.7*  MCV 94.8 95.0 94.2 94.3  PLT 349 253 246 [id number]    Basic Metabolic Panel: Recent Labs  Lab 06/18/19 1938 06/19/19 0913 06/20/19 0148 06/21/19 0200 06/22/19 0822  NA 139 137 141 137 140  K 3.3* 4.0 4.0 2.9* 4.3  CL 105 111 116* 106 111  CO2 21* 20* 18* 21* 22  GLUCOSE 107* 106* 83 124* 98  BUN 11 10 11  5* <5*  CREATININE 1.04 0.98 0.91 0.84 1.03  CALCIUM 9.2 8.0* 7.9* 8.0* 8.7*  MG  --  1.6*  --   --   --      DVT prophylaxis: Lovenox  Code Status: Full code  Family Communication: No family at bedside  Disposition Plan: likely home when medically ready for discharge        BMI  Estimated body mass index is 33.97 kg/m as calculated from the following:   Height as of 04/03/19: 5\' 9"  (1.753 m).   Weight as of this encounter: 104.3  kg.  Scheduled medications:  . enoxaparin (LOVENOX) injection  40 mg Subcutaneous Q24H  . losartan  100 mg Oral Daily  . metoprolol tartrate  25 mg Oral BID  . nicotine  21 mg Transdermal Daily  . pantoprazole (PROTONIX) IV  40 mg Intravenous Q12H  . sodium chloride flush  3 mL Intravenous Q12H  . sucralfate  1 g Oral TID WC & HS  . thiamine  100 mg Oral Daily   Or  . thiamine  100 mg Intravenous Daily     Consultants:  None  Procedures:  None   Antibiotics:   Anti-infectives (From admission, onward)   None       Objective   Vitals:   06/23/19 1030 06/23/19 1040 06/23/19 1405 06/23/19 2106  BP: (!) 197/108 (!) 193/100 130/84 (!) 157/92  Pulse: 64 (!) 59 81 60  Resp: 15 14 20    Temp:   98.8 F (37.1 C) 98.3 F (36.8 C)  TempSrc:   Oral   SpO2: 96% 95% 99% 96%  Weight:        Intake/Output Summary (Last 24 hours) at 06/24/2019 1023 Last data filed at 06/23/2019 1721 Gross per 24 hour  Intake -  Output 150 ml  Net -150 ml   Filed Weights   06/23/19 0846  Weight: 104.3 kg     Physical Examination:  General-appears in no acute distress Heart-S1-S2, regular, no murmur auscultated Lungs-clear to auscultation bilaterally, no wheezing or crackles auscultated Abdomen-soft, mild tenderness in epigastric region Extremities-no edema in the lower extremities Neuro-alert, oriented x3, no focal deficit noted   Data Reviewed: I have personally reviewed following labs and imaging studies   Recent Results (from the past 240 hour(s))  SARS CORONAVIRUS 2 (TAT 6-24 HRS) Nasopharyngeal Nasopharyngeal Swab     Status: None   Collection Time: 06/19/19  4:28 AM   Specimen: Nasopharyngeal Swab  Result Value Ref Range Status   SARS Coronavirus 2 NEGATIVE NEGATIVE Final    Comment: (NOTE) SARS-CoV-2 target nucleic acids are NOT DETECTED. The SARS-CoV-2 RNA is generally detectable in upper and lower respiratory specimens during the acute phase of infection. Negative results do not preclude SARS-CoV-2 infection, do not rule out co-infections with other pathogens, and should not be used as the sole basis for treatment or other patient management decisions. Negative results must be combined with clinical observations, patient history, and epidemiological information. The expected result is Negative. Fact Sheet for Patients: SugarRoll.be Fact  Sheet for Healthcare Providers: https://www.woods-mathews.com/ This test is not yet approved or cleared by the [place] FDA and  has been authorized for detection and/or diagnosis of SARS-CoV-2 by FDA under an Emergency Use Authorization (EUA). This EUA will remain  in effect (meaning this test can be used) for the duration of the COVID-19 declaration under Section 56 4(b)(1) of the Act, 21 U.S.C. section 360bbb-3(b)(1), unless the authorization is terminated or revoked sooner. Performed at [facility], [place] [address] [place], [place] [address]      Liver Function Tests: Recent Labs  Lab 06/19/19 0017 06/20/19 0148 06/21/19 0200 06/22/19 0822  AST 24 14* 16 15  ALT 17 12 13 14   ALKPHOS 62 49 49 51  BILITOT 0.5 0.6 0.5 0.8  PROT 7.1 5.1* 5.4* 5.5*  ALBUMIN 3.0* 2.2* 2.4* 2.5*   Recent Labs  Lab 06/19/19 0017  LIPASE 76*   No results for input(s): AMMONIA in the last 168 hours.  Cardiac Enzymes: No results for input(s): CKTOTAL,  CKMB, CKMBINDEX, TROPONINI in the last 168 hours. BNP (last 3 results) Recent Labs    07/13/18 2219 07/25/18 0021  BNP 146.6* 203.4*    ProBNP (last 3 results) No results for input(s): PROBNP in the last 8760 hours.    Studies: No results found.   Admission status: Inpatient: Based on patients clinical presentation and evaluation of above clinical data, I have made determination that patient meets Inpatient criteria at this time.   [facility] Pager [phone]. If 7PM-7AM, please contact night-coverage at [email or web address], Office  [phone]  password [email or web address]  06/24/2019, 10:23 AM  LOS: 5 days

## 2019-06-24 NOTE — Consult Note (Addendum)
[facility] Consult   Reason for Consult:  Depression and alcohol dependence Referring Physician:  Hospitalist Patient Identification: Todd [name]. MRN:  [id number] Principal Diagnosis: Partial small bowel obstruction (HCC) Diagnosis:  Principal Problem:   Partial small bowel obstruction (HCC) Active Problems:   DM (diabetes mellitus), type 2 ([place])   OBESITY   Essential hypertension   MDD (major depressive disorder), recurrent severe, without psychosis ([place])   Alcohol dependence ([place])   Opioid use disorder, moderate, dependence ([place])   Total Time spent with patient: 45 minutes  Subjective:   Todd Moronta. is a 63 y.o. male patient reports that he came to the hospital because he is having problems with his esophagus.  He states that approximately 2 years ago he started drinking excessively heavy.  However, he states that approximately 8 to 9 months ago he started drinking 2  40 ounce malt liquors a day and some cheap wine.  He states that he got to the point that he did not care anymore because of his amount of alcohol use and stopped taking all of his medications including his medical medications.  He states that he used to be taking Effexor, but does not remember the dose, and he discontinued it as well.  He states that he does not want to die denies having any suicidal homicidal ideations and denies any hallucinations.  He states that he is just concerned about returning home and starting to drink alcohol again.  After discussion of substance abuse residential rehabilitation as well as SAIOP the patient stated that he would like to go to a residential placement.  Patient then asked if he can go to [facility] on [address] because he had been there before.  Patient does report that he is just concerned about returning to his apartment alone.  Patient feels that he does needs to be somewhere to get stabilized and be away from alcohol for quite some time and continues to  deny any suicidal homicidal ideations.  Assessment: Patient is lying in his bed and is able to move around.  Patient has congruent affect and is pleasant, calm, and cooperative.  Patient does appear to be focused on being placed somewhere for several days even though he continues to report that he has a place to live.  Patient does not meet criteria for inpatient psychiatric stay.  Patient is continued to deny suicidal or homicidal ideations and denies any hallucinations.  Feel that patient will benefit from a residential rehabilitation for his substance abuse and he is in agreement with this.  I requested patient to be restarted on Zoloft 25 mg p.o. daily.  After reviewing patient's chart his blood pressure is elevated would recommend not to restart any Effexor at this time.  Also have recommended for the patient to have a social work consult to assist with his placement into a residential rehab.  HPI:  63 year old male with a history of polysubstance abuse, EtOH, cocaine, heroin, paroxysmal atrial fibrillation/SVT, history of DVT and pulmonary embolism, not on anticoagulation, diabetes mellitus type 2, history of esophagitis and gastritis, hypertension, ascending aortic aneurysm came to ED with 2 days history of abdominal pain with nausea and vomiting.  He also had mildly elevated lipase and CT abdomen/pelvis showed partial distal SBO with esophagitis.  Past Psychiatric History: Major depressive disorder, all dependence, reports 1 previous hospitalization at [facility] years ago and was started on Effexor and discontinued it himself approximately 8 to 9 months ago.  Denies  any suicide attempt  Risk to Self:   Risk to Others:   Prior Inpatient Therapy:   Prior Outpatient Therapy:    Past Medical History:  Past Medical History:  Diagnosis Date  . Arthritis    "fingers" (07/14/2018)  . Depression   . DVT (deep venous thrombosis) (HCC) LLE  . Hepatitis C     finished harvoni tx ~ 2017  .  Hypercholesterolemia   . Hypertension   . Prostate cancer ([place]) 44yrs ago  . Pulmonary embolism and infarction ([place]) 07/13/2018  . Sleep apnea    not currently using cpap, mask causing vertigo  . Type II diabetes mellitus ([place])     Past Surgical History:  Procedure Laterality Date  . BIOPSY  01/12/2019   Procedure: BIOPSY;  Surgeon: Ronald Lobo, MD;  Location: [facility];  Service: Endoscopy;;  . COLONOSCOPY WITH PROPOFOL N/A 02/19/2014   Procedure: COLONOSCOPY WITH PROPOFOL;  Surgeon: Garlan Fair, MD;  Location: [facility] END[facility]COPY;  Service: Endoscopy;  Laterality: N/A;  . ENDOVENOUS ABLATION SAPHENOUS VEIN W/ LASER Left 11/22/2017   endovenous laser ablation L SSV by Tinnie Gens MD   . ESOPHAGOGASTRODUODENOSCOPY (EGD) WITH PROPOFOL N/A 01/12/2019   Procedure: ESOPHAGOGASTRODUODENOSCOPY (EGD) WITH PROPOFOL;  Surgeon: Ronald Lobo, MD;  Location: [facility] ENDOSCOPY;  Service: Endoscopy;  Laterality: N/A;  Patient is also scheduled for barium swallow; please notify radiology after patient's EGD is complete so that barium swallow follows the endoscopy, not vice versa  . PROSTATECTOMY  2008  . REPAIR QUADRICEPS / HAMSTRING MUSCLE Right    Family History:  Family History  Problem Relation Age of Onset  . Cancer Father        PROSTATE   Family Psychiatric  History: None reported Social History:  Social History   Substance and Sexual Activity  Alcohol Use Yes   Comment: 07/14/2018 "40oz beer/day"     Social History   Substance and Sexual Activity  Drug Use Not Currently   Comment: in recovery     Social History   Socioeconomic History  . Marital status: Single    Spouse name: Not on file  . Number of children: 2  . Years of education: 12th  . Highest education level: Not on file  Occupational History  . Occupation: post office  Social Needs  . Financial resource strain: Not on file  . Food insecurity    Worry: Not on file    Inability: Not on file  . Transportation  needs    Medical: Not on file    Non-medical: Not on file  Tobacco Use  . Smoking status: Current Every Day Smoker    Packs/day: 0.12    Years: 45.00    Pack years: 5.40  . Smokeless tobacco: Never Used  Substance and Sexual Activity  . Alcohol use: Yes    Comment: 07/14/2018 "40oz beer/day"  . 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 Sexual activity: Not Currently  Lifestyle  . Physical activity    Days per week: Not on file    Minutes per session: Not on file  . Stress: Not on file  Relationships  . Social Herbalist on phone: Not on file    Gets together: Not on file    Attends religious service: Not on file    Active member of club or organization: Not on file    Attends meetings of clubs or organizations: Not on file    Relationship  status: Not on file  Other Topics Concern  . Not on file  Social History Narrative   ** Merged History Encounter **       Patient lives at home alone.Marland KitchenMarland KitchenDrinks coffee daily    Additional Social History:    Allergies:  No Known Allergies  Labs:  Results for orders placed or performed during the hospital encounter of 06/18/19 (from the past 48 hour(s))  Glucose, capillary     Status: Abnormal   Collection Time: 06/22/19  4:25 PM  Result Value Ref Range   Glucose-Capillary 112 (H) 70 - 99 mg/dL  Glucose, capillary     Status: Abnormal   Collection Time: 06/22/19  9:04 PM  Result Value Ref Range   Glucose-Capillary 109 (H) 70 - 99 mg/dL  Glucose, capillary     Status: None   Collection Time: 06/23/19  7:41 AM  Result Value Ref Range   Glucose-Capillary 80 70 - 99 mg/dL   Comment 1 Notify RN    Comment 2 Document in Chart   Glucose, capillary     Status: Abnormal   Collection Time: 06/23/19 12:52 PM  Result Value Ref Range   Glucose-Capillary 108 (H) 70 - 99 mg/dL   Comment 1 Notify RN    Comment 2 Document in Chart   Glucose, capillary     Status: None   Collection Time: 06/23/19  4:08 PM  Result Value  Ref Range   Glucose-Capillary 90 70 - 99 mg/dL   Comment 1 Notify RN    Comment 2 Document in Chart   Glucose, capillary     Status: Abnormal   Collection Time: 06/23/19  9:04 PM  Result Value Ref Range   Glucose-Capillary 108 (H) 70 - 99 mg/dL  Glucose, capillary     Status: None   Collection Time: 06/24/19 10:57 AM  Result Value Ref Range   Glucose-Capillary 95 70 - 99 mg/dL    Current Facility-Administered Medications  Medication Dose Route Frequency Provider Last Rate Last Dose  . 0.9 %  sodium chloride infusion  250 mL Intravenous PRN Cristescu, Mircea G, MD      . enoxaparin (LOVENOX) injection 40 mg  40 mg Subcutaneous Q24H Cristescu, Mircea G, MD   40 mg at 06/24/19 0457  . labetalol (NORMODYNE) injection 5 mg  5 mg Intravenous Q2H PRN Cristescu, Mircea G, MD   5 mg at 06/20/19 1440  . losartan (COZAAR) tablet 100 mg  100 mg Oral Daily Oswald Hillock, MD   100 mg at 06/24/19 1018  . metoprolol tartrate (LOPRESSOR) tablet 25 mg  25 mg Oral BID Oswald Hillock, MD   25 mg at 06/24/19 1017  . morphine 2 MG/ML injection 2 mg  2 mg Intravenous Q3H PRN Dhungel, Nishant, MD   2 mg at 06/24/19 1436  . nicotine (NICODERM CQ - dosed in mg/24 hours) patch 21 mg  21 mg Transdermal Daily Dhungel, Nishant, MD   21 mg at 06/24/19 1019  . pantoprazole (PROTONIX) injection 40 mg  40 mg Intravenous Q12H Brahmbhatt, Parag, MD   40 mg at 06/24/19 1017  . sertraline (ZOLOFT) tablet 25 mg  25 mg Oral Daily [place], Gagan S, MD      . sodium chloride flush (NS) 0.9 % injection 3 mL  3 mL Intravenous Q12H Cristescu, Linard Millers, MD   3 mL at 06/22/19 2124  . sodium chloride flush (NS) 0.9 % injection 3 mL  3 mL Intravenous PRN Cristescu, Mircea G,  MD      . sucralfate (CARAFATE) 1 GM/10ML suspension 1 g  1 g Oral TID WC & HS Oswald Hillock, MD   1 g at 06/24/19 1212  . thiamine (VITAMIN B-1) tablet 100 mg  100 mg Oral Daily Horton, Barbette Hair, MD   100 mg at 06/24/19 1017   Or  . thiamine (B-1) injection 100 mg   100 mg Intravenous Daily Horton, Barbette Hair, MD   100 mg at 06/23/19 1111    Musculoskeletal: Strength & Muscle Tone: within normal limits Gait & Station: Patient remained in bed during evaluation Patient leans: N/A  Psychiatric Specialty Exam: Physical Exam  Nursing note and vitals reviewed. Constitutional: He is oriented to person, place, and time. He appears well-developed and well-nourished.  Cardiovascular: Normal rate.  Respiratory: Effort normal.  Musculoskeletal: Normal range of motion.  Neurological: He is alert and oriented to person, place, and time.  Skin: Skin is warm.    Review of Systems  Eyes: Negative.   Gastrointestinal:       Reports pain due to his esophagitis  Skin: Negative.   Neurological: Negative.   Psychiatric/Behavioral: Positive for depression and substance abuse. Negative for hallucinations and suicidal ideas.    Blood pressure (!) 157/92, pulse 60, temperature 98.3 F (36.8 C), resp. rate 20, weight 104.3 kg, SpO2 96 %.Body mass index is 33.97 kg/m.  General Appearance: Casual  Eye Contact:  Good  Speech:  Clear and Coherent and Normal Rate  Volume:  Normal  Mood:  Anxious  Affect:  Congruent  Thought Process:  Coherent and Descriptions of Associations: Intact  Orientation:  Full (Time, Place, and Person)  Thought Content:  WDL  Suicidal Thoughts:  No  Homicidal Thoughts:  No  Memory:  Immediate;   Good Recent;   Good Remote;   Good  Judgement:  Fair  Insight:  Fair  Psychomotor Activity:  Normal  Concentration:  Concentration: Good  Recall:  Good  Fund of Knowledge:  Good  Language:  Good  Akathisia:  No  Handed:  Right  AIMS (if indicated):     Assets:  Communication Skills Desire for Improvement Financial Resources/Insurance Housing Resilience  ADL's:  Intact  Cognition:  WNL  Sleep:        Treatment Plan Summary: Medication management  Start Zoloft 25 mg p.o. daily for depression and anxiety Social work consult to  assist with substance abuse residential rehabilitation placement upon discharge  Disposition: No evidence of imminent risk to self or others at present.   Patient does not meet criteria for psychiatric inpatient admission.  [place], FNP 06/24/2019 3:14 PM   Attest to NP note

## 2019-06-25 LAB — GLUCOSE, CAPILLARY: Glucose-Capillary: 80 mg/dL (ref 70–99)

## 2019-06-25 NOTE — TOC Progression Note (Signed)
Transition of Care (TOC) - Progression Note    Patient Details  Name: Todd [name]. MRN: [id number] Date of Birth: 17-May-1956  Transition of [facility]) CM/SW Contact  Jacalyn Lefevre Edson Snowball, RN Phone Number: 06/25/2019, 2:24 PM  Clinical Narrative:     Provided Outpatient ETOH  Resources. Patient from home alone recently separated.  Patient wants to discharge to home at discharge and "take care of things" and then go to rehab.   Expected Discharge Plan: Home/Self Care Barriers to Discharge: Continued Medical Work up  Expected Discharge Plan and Services Expected Discharge Plan: Home/Self Care   Discharge Planning Services: CM Consult   Living arrangements for the past 2 months: Apartment                 DME Arranged: N/A           HH Agency: NA         Social Determinants of Health (SDOH) Interventions    Readmission Risk Interventions No flowsheet data found.

## 2019-06-25 NOTE — Progress Notes (Addendum)
PROGRESS NOTE  Todd [name]. [id number] DOB: 08/21/1955 DOA: 06/18/2019 PCP: [name], MD  HPI/Recap of past 61 hours: 63 year old male with history of polysubstance abuse alcohol abuse cocaine abuse heroin abuse paroxysmal atrial fibrillation SVT history of DVT pulmonary embolism on and not on anticoagulation diabetes mellitus type 2 esophagitis gastritis hypertension ascending aortic aneurysm was admitted to the emergency room because of abdominal pain of 2 days duration with nausea and vomiting he also had mildly elevated lipase and CT abdomen showed partial distal small bowel obstruction with esophagitis.    Subjective: Patient seen and examined at bedside he is complaining of chest pain with burning sensation sharp worse when he swallows he said he is very sharp burning sensation when he swallows. Patient is quite concerned about his substance abuse and he stated at this time he wants to go for rehab and he has made  a decision to be clean and to stay healthy he is already talking to a therapist  Assessment/Plan: Principal Problem:   Partial small bowel obstruction ([place]) Active Problems:   DM (diabetes mellitus), type 2 ([place])   OBESITY   Essential hypertension   MDD (major depressive disorder), recurrent severe, without psychosis ([place])   Alcohol dependence ([place])   Opioid use disorder, moderate, dependence ([place])  1.  Partial small bowel obstruction which has resolved  2.  Abdominal pain with gastritis and esophagitis status post EGD with findings significant for- LA Grade D esophagitis of mid and distal esophagus, with sparing of first few cm of the proximal esophagus. Suspect severe reflux esophagitis continue IV Protonix twice a day and sucralfate  3.  History of alcohol abuse CIWA protocol thiamine and folate in place no symptoms or signs of withdrawal at this time patient is determined to stop alcohol drinking but he is scared that if he goes home he will start  drinking again he lives alone.  4.  Depression denies suicidal ideation awaiting psychiatric consult  5.  Hypertension continue Cozaar and Lopressor.  6.  Hypokalemia replace potassium  7.  Odynophagia continue PPI and sucralfate   Code Status: Full  Severity of Illness: The appropriate patient status for this patient is INPATIENT. Inpatient status is judged to be reasonable and necessary in order to provide the required intensity of service to ensure the patient's safety. The patient's presenting symptoms, physical exam findings, and initial radiographic and laboratory data in the context of their chronic comorbidities is felt to place them at high risk for further clinical deterioration. Furthermore, it is not anticipated that the patient will be medically stable for discharge from the hospital within 2 midnights of admission. The following factors support the patient status of inpatient.   " The patient's presenting symptoms include epigastric/chest pain. " The worrisome physical exam findings include small bowel obstruction partial. " The initial radiographic and laboratory data are worrisome because of see labs. " The chronic co-morbidities include alcohol abuse substance abuse.   * I certify that at the point of admission it is my clinical judgment that the patient will require inpatient hospital care spanning beyond 2 midnights from the point of admission due to high intensity of service, high risk for further deterioration and high frequency of surveillance required.*    Family Communication: Patient  Disposition Plan: Home/rehab   Consultants:  GI  Procedures:  Endoscopy on June 23, 2019  Antimicrobials:  None  DVT prophylaxis: Lovenox subacute   Objective: Vitals:   06/24/19 1529 06/24/19 2100 06/25/19 0408  06/25/19 0441  BP: (!) 157/90 (!) 158/99 (!) 158/79 (!) 157/86  Pulse: (!) 56 62  75  Resp: (!) 22 20 18 18   Temp: 98.4 F (36.9 C) 98.7 F (37.1  C) 98.1 F (36.7 C) 99 F (37.2 C)  TempSrc: Oral Oral  Oral  SpO2: 99% 96% 100% 100%  Weight:        Intake/Output Summary (Last 24 hours) at 06/25/2019 [id number] Last data filed at 06/25/2019 0400 Gross per 24 hour  Intake 360 ml  Output 700 ml  Net -340 ml   Filed Weights   06/23/19 0846  Weight: 104.3 kg   Body mass index is 33.97 kg/m.  Exam:  . General: 63 y.o. year-old male well developed well nourished in no acute distress.  Alert and oriented x3. . Cardiovascular: Regular rate and rhythm with no rubs or gallops.  No thyromegaly or JVD noted.   Marland Kitchen Respiratory: Clear to auscultation with no wheezes or rales. Good inspiratory effort. . Abdomen: Soft nontender nondistended with normal bowel sounds x4 quadrants. . Musculoskeletal: No lower extremity edema. 2/4 pulses in all 4 extremities. . Skin: No ulcerative lesions noted or rashes, . Psychiatry: Mood is appropriate for condition and setting    Data Reviewed: CBC: Recent Labs  Lab 06/18/19 1938 06/20/19 0148 06/21/19 0200 06/22/19 0822  WBC 6.4 5.6 5.3 4.9  HGB 13.9 10.7* 10.8* 12.1*  HCT 42.3 32.3* 32.7* 36.7*  MCV 94.8 95.0 94.2 94.3  PLT 349 253 246 [id number]   Basic Metabolic Panel: Recent Labs  Lab 06/18/19 1938 06/19/19 0913 06/20/19 0148 06/21/19 0200 06/22/19 0822  NA 139 137 141 137 140  K 3.3* 4.0 4.0 2.9* 4.3  CL 105 111 116* 106 111  CO2 21* 20* 18* 21* 22  GLUCOSE 107* 106* 83 124* 98  BUN 11 10 11  5* <5*  CREATININE 1.04 0.98 0.91 0.84 1.03  CALCIUM 9.2 8.0* 7.9* 8.0* 8.7*  MG  --  1.6*  --   --   --    GFR: Estimated Creatinine Clearance: 87.3 mL/min (by C-G formula based on SCr of 1.03 mg/dL). Liver Function Tests: Recent Labs  Lab 06/19/19 0017 06/20/19 0148 06/21/19 0200 06/22/19 0822  AST 24 14* 16 15  ALT 17 12 13 14   ALKPHOS 62 49 49 51  BILITOT 0.5 0.6 0.5 0.8  PROT 7.1 5.1* 5.4* 5.5*  ALBUMIN 3.0* 2.2* 2.4* 2.5*   Recent Labs  Lab 06/19/19 0017  LIPASE 76*   No  results for input(s): AMMONIA in the last 168 hours. Coagulation Profile: No results for input(s): INR, PROTIME in the last 168 hours. Cardiac Enzymes: No results for input(s): CKTOTAL, CKMB, CKMBINDEX, TROPONINI in the last 168 hours. BNP (last 3 results) No results for input(s): PROBNP in the last 8760 hours. HbA1C: No results for input(s): HGBA1C in the last 72 hours. CBG: Recent Labs  Lab 06/23/19 1608 06/23/19 2104 06/24/19 1057 06/24/19 1715 06/25/19 0800  GLUCAP 90 108* 95 133* 80   Lipid Profile: No results for input(s): CHOL, HDL, LDLCALC, TRIG, CHOLHDL, LDLDIRECT in the last 72 hours. Thyroid Function Tests: No results for input(s): TSH, T4TOTAL, FREET4, T3FREE, THYROIDAB in the last 72 hours. Anemia Panel: No results for input(s): VITAMINB12, FOLATE, FERRITIN, TIBC, IRON, RETICCTPCT in the last 72 hours. Urine analysis:    Component Value Date/Time   COLORURINE YELLOW 04/03/2019 1722   APPEARANCEUR CLOUDY (A) 04/03/2019 1722   LABSPEC 1.010 04/03/2019 1722   PHURINE 6.0 04/03/2019  1722   GLUCOSEU 50 (A) 04/03/2019 1722   HGBUR SMALL (A) 04/03/2019 1722   BILIRUBINUR NEGATIVE 04/03/2019 1722   KETONESUR NEGATIVE 04/03/2019 1722   PROTEINUR 100 (A) 04/03/2019 1722   UROBILINOGEN 0.2 12/24/2009 1944   NITRITE NEGATIVE 04/03/2019 1722   LEUKOCYTESUR NEGATIVE 04/03/2019 1722   Sepsis Labs: @LABRCNTIP (procalcitonin:4,lacticidven:4)  ) Recent Results (from the past 240 hour(s))  SARS CORONAVIRUS 2 (TAT 6-24 HRS) Nasopharyngeal Nasopharyngeal Swab     Status: None   Collection Time: 06/19/19  4:28 AM   Specimen: Nasopharyngeal Swab  Result Value Ref Range Status   SARS Coronavirus 2 NEGATIVE NEGATIVE Final    Comment: (NOTE) SARS-CoV-2 target nucleic acids are NOT DETECTED. The SARS-CoV-2 RNA is generally detectable in upper and lower respiratory specimens during the acute phase of infection. Negative results do not preclude SARS-CoV-2 infection, do not rule  out co-infections with other pathogens, and should not be used as the sole basis for treatment or other patient management decisions. Negative results must be combined with clinical observations, patient history, and epidemiological information. The expected result is Negative. Fact Sheet for Patients: SugarRoll.be Fact Sheet for Healthcare Providers: https://www.woods-mathews.com/ This test is not yet approved or cleared by the [place] FDA and  has been authorized for detection and/or diagnosis of SARS-CoV-2 by FDA under an Emergency Use Authorization (EUA). This EUA will remain  in effect (meaning this test can be used) for the duration of the COVID-19 declaration under Section 56 4(b)(1) of the Act, 21 U.S.C. section 360bbb-3(b)(1), unless the authorization is terminated or revoked sooner. Performed at [facility], [place] [address] [place], [place] [address]       Studies: No results found.  Scheduled Meds: . enoxaparin (LOVENOX) injection  40 mg Subcutaneous Q24H  . losartan  100 mg Oral Daily  . metoprolol tartrate  25 mg Oral BID  . nicotine  21 mg Transdermal Daily  . pantoprazole (PROTONIX) IV  40 mg Intravenous Q12H  . sertraline  25 mg Oral Daily  . sodium chloride flush  3 mL Intravenous Q12H  . sucralfate  1 g Oral TID WC & HS  . thiamine  100 mg Oral Daily   Or  . thiamine  100 mg Intravenous Daily    Continuous Infusions: . sodium chloride       LOS: 6 days     Cristal Deer, MD [facility]  To reach me or the doctor on call, go to: [email or web address] Password [email or web address]  06/25/2019, 8:22 AM

## 2019-06-25 NOTE — Progress Notes (Signed)
[facility] Gastroenterology Progress Note  Todd [name]. 63 y.o. Apr 06, 1956  CC: Odynophagia, epigastric abdominal pain   Subjective: Continues to have pain and trouble with swallowing.  Denies any diarrhea or constipation.  Denies any blood in the stool.  ROS : Negative for shortness of breath.   Objective: Vital signs in last 24 hours: Vitals:   06/25/19 0408 06/25/19 0441  BP: (!) 158/79 (!) 157/86  Pulse:  75  Resp: 18 18  Temp: 98.1 F (36.7 C) 99 F (37.2 C)  SpO2: 100% 100%    Physical Exam: General.  Well developed.  Not in acute distress Abdomen.  Epigastric tenderness to palpation, soft, nondistended, bowel sounds present Neuro.  Alert/oriented x3  Lab Results: No results for input(s): NA, K, CL, CO2, GLUCOSE, BUN, CREATININE, CALCIUM, MG, PHOS in the last 72 hours. No results for input(s): AST, ALT, ALKPHOS, BILITOT, PROT, ALBUMIN in the last 72 hours. No results for input(s): WBC, NEUTROABS, HGB, HCT, MCV, PLT in the last 72 hours. No results for input(s): LABPROT, INR in the last 72 hours.    Assessment/Plan: Odynophagia with EGD June 23, 2019 again showing LA grade D esophagitis. Epigastric abdominal pain.  Probably from esophagitis  Recommendations ------------------------- -Pathology and cytology pending -Continue IV PPI and p.o. Carafate -GI will follow   Otis Brace MD, [place] 06/25/2019, 10:43 AM  Contact #  [phone]

## 2019-06-25 NOTE — Social Work (Signed)
Acknowledging consult for ETOH resources.  Will provide to pt, however cannot guarantee placement in ETOH rehab program at discharge.   Westley Hummer, MSW, [place] [facility] [phone]

## 2019-06-26 MED ORDER — LIDOCAINE VISCOUS HCL 2 % MT SOLN
15.0000 mL | OROMUCOSAL | Status: DC | PRN
  Administered 2019-06-26: 15 mL via OROMUCOSAL
  Filled 2019-06-26 (×2): qty 15

## 2019-06-26 NOTE — Progress Notes (Signed)
PROGRESS NOTE  Todd [name]. [id number] DOB: 1955/10/24 DOA: 06/18/2019 PCP: [name], MD  HPI/Recap of past 61 hours: 63 year old male with history of polysubstance abuse alcohol abuse cocaine abuse heroin abuse paroxysmal atrial fibrillation SVT history of DVT pulmonary embolism on and not on anticoagulation diabetes mellitus type 2 esophagitis gastritis hypertension ascending aortic aneurysm was admitted to the emergency room because of abdominal pain of 2 days duration with nausea and vomiting he also had mildly elevated lipase and CT abdomen showed partial distal small bowel obstruction with esophagitis.    Subjective: Patient seen and examined at bedside he is complaining of chest pain with burning sensation sharp worse when he swallows he said he is very sharp burning sensation when he swallows. Patient is quite concerned about his substance abuse and he stated at this time he wants to go for rehab and he has made  a decision to be clean and to stay healthy he is already talking to a therapist  Subjective June 26, 2019: Patient seen and examined at bedside.  He still complaining of epigastric pain with burning when he tries to eat, he stated that he cannot eat anything he could not eat his lunch because it was too painful.  He is still very much interested in alcohol rehab he said he would like to go to inpatient rehab and he has been given information on rehab centers which he plans on getting into when he is discharged.  I will start him on viscous lidocaine  Assessment/Plan: Principal Problem:   Partial small bowel obstruction (HCC) Active Problems:   DM (diabetes mellitus), type 2 ([place])   OBESITY   Essential hypertension   MDD (major depressive disorder), recurrent severe, without psychosis ([place])   Alcohol dependence ([place])   Opioid use disorder, moderate, dependence ([place])  1.  Partial small bowel obstruction which has resolved  2.  Abdominal pain with  gastritis and esophagitis status post EGD with findings significant for- LA Grade D esophagitis of mid and distal esophagus, with sparing of first few cm of the proximal esophagus. Suspect severe reflux esophagitis continue IV Protonix twice a day and sucralfate  3.  History of alcohol abuse CIWA protocol thiamine and folate in place no symptoms or signs of withdrawal at this time patient is determined to stop alcohol drinking but he is scared that if he goes home he will start drinking again he lives alone.  4.  Depression denies suicidal ideation awaiting psychiatric consult  5.  Hypertension continue Cozaar and Lopressor.  6.  Hypokalemia replace potassium  7.  Odynophagia continue PPI and sucralfate I will add viscous lidocaine   Code Status: Full  Severity of Illness: The appropriate patient status for this patient is INPATIENT. Inpatient status is judged to be reasonable and necessary in order to provide the required intensity of service to ensure the patient's safety. The patient's presenting symptoms, physical exam findings, and initial radiographic and laboratory data in the context of their chronic comorbidities is felt to place them at high risk for further clinical deterioration. Furthermore, it is not anticipated that the patient will be medically stable for discharge from the hospital within 2 midnights of admission. The following factors support the patient status of inpatient.   " The patient's presenting symptoms include epigastric/chest pain. " The worrisome physical exam findings include small bowel obstruction partial. " The initial radiographic and laboratory data are worrisome because of see labs. " The chronic co-morbidities include alcohol abuse substance  abuse.   * I certify that at the point of admission it is my clinical judgment that the patient will require inpatient hospital care spanning beyond 2 midnights from the point of admission due to high intensity of  service, high risk for further deterioration and high frequency of surveillance required.*    Family Communication: Patient  Disposition Plan: Home/rehab   Consultants:  GI  Procedures:  Endoscopy on June 23, 2019  Antimicrobials:  None  DVT prophylaxis: Lovenox subacute   Objective: Vitals:   06/25/19 0408 06/25/19 0441 06/25/19 1301 06/25/19 1959  BP: (!) 158/79 (!) 157/86 (!) 166/98 (!) 164/76  Pulse:  75 60 (!) 58  Resp: 18 18 18 18   Temp: 98.1 F (36.7 C) 99 F (37.2 C) 98.5 F (36.9 C) 98.6 F (37 C)  TempSrc:  Oral Oral Oral  SpO2: 100% 100% 98% 94%  Weight:        Intake/Output Summary (Last 24 hours) at 06/26/2019 0951 Last data filed at 06/26/2019 0600 Gross per 24 hour  Intake 1050 ml  Output 2175 ml  Net -1125 ml   Filed Weights   06/23/19 0846  Weight: 104.3 kg   Body mass index is 33.97 kg/m.  Exam:  . General: 63 y.o. year-old male well developed well nourished in no acute distress.  Alert and oriented x3. . Cardiovascular: Regular rate and rhythm with no rubs or gallops.  No thyromegaly or JVD noted.   Marland Kitchen Respiratory: Clear to auscultation with no wheezes or rales. Good inspiratory effort. . Abdomen: Soft nontender nondistended with normal bowel sounds x4 quadrants. . Musculoskeletal: No lower extremity edema. 2/4 pulses in all 4 extremities. . Skin: No ulcerative lesions noted or rashes, . Psychiatry: Mood is appropriate for condition and setting    Data Reviewed: CBC: Recent Labs  Lab 06/20/19 0148 06/21/19 0200 06/22/19 0822  WBC 5.6 5.3 4.9  HGB 10.7* 10.8* 12.1*  HCT 32.3* 32.7* 36.7*  MCV 95.0 94.2 94.3  PLT 253 246 [id number]   Basic Metabolic Panel: Recent Labs  Lab 06/20/19 0148 06/21/19 0200 06/22/19 0822  NA 141 137 140  K 4.0 2.9* 4.3  CL 116* 106 111  CO2 18* 21* 22  GLUCOSE 83 124* 98  BUN 11 5* <5*  CREATININE 0.91 0.84 1.03  CALCIUM 7.9* 8.0* 8.7*   GFR: Estimated Creatinine Clearance: 87.3  mL/min (by C-G formula based on SCr of 1.03 mg/dL). Liver Function Tests: Recent Labs  Lab 06/20/19 0148 06/21/19 0200 06/22/19 0822  AST 14* 16 15  ALT 12 13 14   ALKPHOS 49 49 51  BILITOT 0.6 0.5 0.8  PROT 5.1* 5.4* 5.5*  ALBUMIN 2.2* 2.4* 2.5*   No results for input(s): LIPASE, AMYLASE in the last 168 hours. No results for input(s): AMMONIA in the last 168 hours. Coagulation Profile: No results for input(s): INR, PROTIME in the last 168 hours. Cardiac Enzymes: No results for input(s): CKTOTAL, CKMB, CKMBINDEX, TROPONINI in the last 168 hours. BNP (last 3 results) No results for input(s): PROBNP in the last 8760 hours. HbA1C: No results for input(s): HGBA1C in the last 72 hours. CBG: Recent Labs  Lab 06/23/19 1608 06/23/19 2104 06/24/19 1057 06/24/19 1715 06/25/19 0800  GLUCAP 90 108* 95 133* 80   Lipid Profile: No results for input(s): CHOL, HDL, LDLCALC, TRIG, CHOLHDL, LDLDIRECT in the last 72 hours. Thyroid Function Tests: No results for input(s): TSH, T4TOTAL, FREET4, T3FREE, THYROIDAB in the last 72 hours. Anemia Panel: No results for  input(s): VITAMINB12, FOLATE, FERRITIN, TIBC, IRON, RETICCTPCT in the last 72 hours. Urine analysis:    Component Value Date/Time   COLORURINE YELLOW 04/03/2019 1722   APPEARANCEUR CLOUDY (A) 04/03/2019 1722   LABSPEC 1.010 04/03/2019 1722   PHURINE 6.0 04/03/2019 1722   GLUCOSEU 50 (A) 04/03/2019 1722   HGBUR SMALL (A) 04/03/2019 1722   BILIRUBINUR NEGATIVE 04/03/2019 1722   KETONESUR NEGATIVE 04/03/2019 1722   PROTEINUR 100 (A) 04/03/2019 1722   UROBILINOGEN 0.2 12/24/2009 1944   NITRITE NEGATIVE 04/03/2019 1722   LEUKOCYTESUR NEGATIVE 04/03/2019 1722   Sepsis Labs: @LABRCNTIP (procalcitonin:4,lacticidven:4)  ) Recent Results (from the past 240 hour(s))  SARS CORONAVIRUS 2 (TAT 6-24 HRS) Nasopharyngeal Nasopharyngeal Swab     Status: None   Collection Time: 06/19/19  4:28 AM   Specimen: Nasopharyngeal Swab  Result  Value Ref Range Status   SARS Coronavirus 2 NEGATIVE NEGATIVE Final    Comment: (NOTE) SARS-CoV-2 target nucleic acids are NOT DETECTED. The SARS-CoV-2 RNA is generally detectable in upper and lower respiratory specimens during the acute phase of infection. Negative results do not preclude SARS-CoV-2 infection, do not rule out co-infections with other pathogens, and should not be used as the sole basis for treatment or other patient management decisions. Negative results must be combined with clinical observations, patient history, and epidemiological information. The expected result is Negative. Fact Sheet for Patients: SugarRoll.be Fact Sheet for Healthcare Providers: https://www.woods-mathews.com/ This test is not yet approved or cleared by the [place] FDA and  has been authorized for detection and/or diagnosis of SARS-CoV-2 by FDA under an Emergency Use Authorization (EUA). This EUA will remain  in effect (meaning this test can be used) for the duration of the COVID-19 declaration under Section 56 4(b)(1) of the Act, 21 U.S.C. section 360bbb-3(b)(1), unless the authorization is terminated or revoked sooner. Performed at [facility], [place] [address], [place], [place] [address]       Studies: No results found.  Scheduled Meds: . enoxaparin (LOVENOX) injection  40 mg Subcutaneous Q24H  . losartan  100 mg Oral Daily  . metoprolol tartrate  25 mg Oral BID  . nicotine  21 mg Transdermal Daily  . pantoprazole (PROTONIX) IV  40 mg Intravenous Q12H  . sertraline  25 mg Oral Daily  . sodium chloride flush  3 mL Intravenous Q12H  . sucralfate  1 g Oral TID WC & HS  . thiamine  100 mg Oral Daily   Or  . thiamine  100 mg Intravenous Daily    Continuous Infusions: . sodium chloride       LOS: 7 days     Cristal Deer, MD [facility]  To reach me or the doctor on call, go to: [email or web address] Password [email or web address]   06/26/2019, 9:51 AM

## 2019-06-26 NOTE — Progress Notes (Signed)
Todd [name]. 12:41 PM  Subjective: Patient without any new complaints but not swallowing any better and his case discussed with my partners and unfortunately biopsies are pending but no medicine seem to be working he tends to swallow room temperature things better than cold  Objective: Vital signs stable afebrile no acute distress abdomen is soft nontender very minimal chest wall tenderness on palpation  Assessment: Odynophagia  Plan: Continue present management await pathology will try viscous lidocaine Todd [name] E  office [phone] After 5PM or if no answer call [phone]

## 2019-06-27 LAB — CREATININE, SERUM
Creatinine, Ser: 1.52 mg/dL — ABNORMAL HIGH (ref 0.61–1.24)
GFR calc Af Amer: 56 mL/min — ABNORMAL LOW (ref >60)
GFR calc non Af Amer: 48 mL/min — ABNORMAL LOW (ref >60)

## 2019-06-27 LAB — CYTOLOGY - NON PAP

## 2019-06-27 LAB — SURGICAL PATHOLOGY

## 2019-06-27 MED ORDER — AMLODIPINE BESYLATE 10 MG PO TABS
10.0000 mg | ORAL_TABLET | Freq: Every day | ORAL | Status: DC
  Administered 2019-06-27 – 2019-06-30 (×4): 10 mg via ORAL
  Filled 2019-06-27 (×4): qty 1

## 2019-06-27 NOTE — Plan of Care (Signed)

## 2019-06-27 NOTE — Consult Note (Signed)
   [facility] CM Inpatient Consult   06/27/2019  Zebedee Whittenberg 1955-12-15 [id number]   Patient was assessed for [facility] for community services. Patient was previously active with [facility].  Chart review reveals as below by MD notes from HPI: 63 year old male with history of polysubstance abuse alcohol rom HPI abuse cocaine abuse heroin abuse paroxysmal atrial fibrillation SVT history of DVT pulmonary embolism on and not on anticoagulation diabetes mellitus type 2 esophagitis gastritis hypertension ascending aortic aneurysm was admitted to the emergency room because of abdominal pain of 2 days duration with nausea and vomiting he also had mildly elevated lipase and CT abdomen showed partial distal small bowel obstruction with esophagitis.    Call placed to patient at the hospital phone and spoke with the patient at [phone]. Attempted to explain [facility] Care Management programs in post hospital assistance.  Patient states, he would prefer to call [facility] when he "gets out and gets some things straighten out. Right now, I am not able to write your information down."  Explained that [facility] could call him and attempted to get his best phone contact number when he states, "I will just look the company up on the Internet."  [facility] can mail information to his residence. Patient currently states he is not feeling that well. Patient discontinued to call.  Plan:  Will follow progress and follow up with inpatient [facility] team.    Of note, [facility] Care Management services does not replace or interfere with any services that are arranged by inpatient [facility] care management team.   For additional questions or referrals please contact:  Natividad Brood, RN BSN [facility]iaison  [phone] business mobile phone Toll free office [phone]  Fax number: [phone] [email or web address]

## 2019-06-27 NOTE — Progress Notes (Signed)
==   No charge Note ==  - Biopsy/cytology  results still pending - continue current management.   Otis Brace MD, [place] 06/27/2019, 12:40 PM  Contact #  [phone]

## 2019-06-27 NOTE — Progress Notes (Signed)
PROGRESS NOTE    Todd [name].  [id number] DOB: 11-19-1955 DOA: 06/18/2019 PCP: [name], MD    Brief Narrative:   Todd Weyman Croon. is a 63 year old male with history of polysubstance abuse alcohol abuse cocaine abuse heroin abuse paroxysmal atrial fibrillation SVT history of DVT pulmonary embolism on and not on anticoagulation diabetes mellitus type 2 esophagitis gastritis hypertension ascending aortic aneurysm was admitted to the emergency room because of abdominal pain of 2 days duration with nausea and vomiting he also had mildly elevated lipase and CT abdomen showed partial distal small bowel obstruction with esophagitis.     Assessment & Plan:   Principal Problem:   Partial small bowel obstruction (HCC) Active Problems:   DM (diabetes mellitus), type 2 ([place])   OBESITY   Essential hypertension   MDD (major depressive disorder), recurrent severe, without psychosis ([place])   Alcohol dependence ([place])   Opioid use disorder, moderate, dependence ([place])  Partial small bowel obstruction Patient presented the ED with 2-day duration of abdominal pain associated with nausea and vomiting.  Initially patient was started on NG tube to low wall suction, IV fluids with resolution of obstruction.  Repeat x-ray abdomen showed nonspecific and nonobstructive bowel gas pattern.  Abdominal pain Gastritis/LA grade D esophagitis Odynophagia Despite resolution of partial SBO as above, patient continued with significant abdominal pain, with nausea/vomiting and continued intolerance to oral intake.  Gastroenterology was consulted and patient underwent EGD on 08/22/2018 with findings of LA grade D esophagitis that was diffuse with gastritis, likely secondary to his history of significant alcohol abuse. --[facility] following, appreciate assistance --Awaiting biopsies from EGD --Continue Carafate 1 g p.o. 3 times daily --Protonix 40 mg IV twice daily --Soft diet --Antiemetics   Hx EtOH abuse Counseled patient on need for complete cessation as it is likely etiology of his underlying abdominal pain, gastritis, esophagitis and a diet aphasia.  Depression Evaluate by psychiatry during hospitalization. --Continue Zoloft 25 mg p.o. daily  Essential hypertension Home medications include losartan 100 mg p.o. daily, metoprolol tartrate 25 mg p.o. twice daily. --Holding losartan secondary to increased creatinine, 1.56 today --Continue metoprolol tartrate 25 mg p.o. twice daily --Start amlodipine 10 mg p.o. daily --Continue to monitor blood pressure  Hypokalemia Repleted during hospitalization.   --Repeat BMP with magnesium in the a.m.    DVT prophylaxis: Lovenox Code Status: Full code Family Communication: None Disposition Plan: Continue inpatient hospitalization, further depending on clinical course   Consultants:   [facility]  Procedures:  EGD: - LA Grade D esophagitis of mid and distal esophagus, with sparing of first few cm of the proximal esophagus. Biopsied and brushed. Suspect severe reflux esophagitis (patient hadprior endoscopy six months ago with similar findings and esophageal biopsies at that time showed no infectious etiology and he was not compliant with his PPI therapy). - Esophageal findings highly likely cause of patient's recurrent odynophagia and epigastric abdominal pain. - Small hiatal hernia. - Gastritis. - Normal duodenal bulb, first portion of the duodenum and second portion of the duodenum.  Antimicrobials:   None   Subjective: Patient seen and examined at bedside, sleeping but easily arousable.  Continues to complain of dysphagia and pain with oral intake.  States will try some aggressive later this morning.  No significant change noted with Carafate, viscous lidocaine, or IV Protonix.  Awaiting surgical pathology results.  GI following.  No other complaints or concerns at this time.  Denies headache, no  fever/chill/night sweats, no chest pain, no palpitations, no  shortness of breath, no cough/congestion.  No acute events overnight per nurse staff.  Objective: Vitals:   06/26/19 1355 06/26/19 2023 06/27/19 0555 06/27/19 1415  BP: (!) 157/92 (!) 186/97 (!) 141/75 (!) 152/95  Pulse: (!) 59 60 (!) 57 (!) 59  Resp: 20 17 16 18   Temp: 99 F (37.2 C) 98.7 F (37.1 C) 98.6 F (37 C) 98.7 F (37.1 C)  TempSrc: Oral Oral Oral Oral  SpO2: 99% 95% 96% 95%  Weight:        Intake/Output Summary (Last 24 hours) at 06/27/2019 1913 Last data filed at 06/27/2019 1851 Gross per 24 hour  Intake 900 ml  Output 250 ml  Net 650 ml   Filed Weights   06/23/19 0846  Weight: 104.3 kg    Examination:  General exam: Appears calm and comfortable  Respiratory system: Clear to auscultation. Respiratory effort normal. Cardiovascular system: S1 & S2 heard, RRR. No JVD, murmurs, rubs, gallops or clicks. No pedal edema. Gastrointestinal system: Abdomen is nondistended, soft and nontender. No organomegaly or masses felt. Normal bowel sounds heard. Central nervous system: Alert and oriented. No focal neurological deficits. Extremities: Symmetric 5 x 5 power. Skin: No rashes, lesions or ulcers Psychiatry: Judgement and insight appear normal. Mood & affect appropriate.     Data Reviewed: I have personally reviewed following labs and imaging studies  CBC: Recent Labs  Lab 06/21/19 0200 06/22/19 0822  WBC 5.3 4.9  HGB 10.8* 12.1*  HCT 32.7* 36.7*  MCV 94.2 94.3  PLT 246 [id number]   Basic Metabolic Panel: Recent Labs  Lab 06/21/19 0200 06/22/19 0822 06/27/19 0311  NA 137 140  --   K 2.9* 4.3  --   CL 106 111  --   CO2 21* 22  --   GLUCOSE 124* 98  --   BUN 5* <5*  --   CREATININE 0.84 1.03 1.52*  CALCIUM 8.0* 8.7*  --    GFR: Estimated Creatinine Clearance: 59.2 mL/min (A) (by C-G formula based on SCr of 1.52 mg/dL (H)). Liver Function Tests: Recent Labs  Lab 06/21/19 0200 06/22/19 0822   AST 16 15  ALT 13 14  ALKPHOS 49 51  BILITOT 0.5 0.8  PROT 5.4* 5.5*  ALBUMIN 2.4* 2.5*   No results for input(s): LIPASE, AMYLASE in the last 168 hours. No results for input(s): AMMONIA in the last 168 hours. Coagulation Profile: No results for input(s): INR, PROTIME in the last 168 hours. Cardiac Enzymes: No results for input(s): CKTOTAL, CKMB, CKMBINDEX, TROPONINI in the last 168 hours. BNP (last 3 results) No results for input(s): PROBNP in the last 8760 hours. HbA1C: No results for input(s): HGBA1C in the last 72 hours. CBG: Recent Labs  Lab 06/23/19 1608 06/23/19 2104 06/24/19 1057 06/24/19 1715 06/25/19 0800  GLUCAP 90 108* 95 133* 80   Lipid Profile: No results for input(s): CHOL, HDL, LDLCALC, TRIG, CHOLHDL, LDLDIRECT in the last 72 hours. Thyroid Function Tests: No results for input(s): TSH, T4TOTAL, FREET4, T3FREE, THYROIDAB in the last 72 hours. Anemia Panel: No results for input(s): VITAMINB12, FOLATE, FERRITIN, TIBC, IRON, RETICCTPCT in the last 72 hours. Sepsis Labs: No results for input(s): PROCALCITON, LATICACIDVEN in the last 168 hours.  Recent Results (from the past 240 hour(s))  SARS CORONAVIRUS 2 (TAT 6-24 HRS) Nasopharyngeal Nasopharyngeal Swab     Status: None   Collection Time: 06/19/19  4:28 AM   Specimen: Nasopharyngeal Swab  Result Value Ref Range Status   SARS Coronavirus 2 NEGATIVE  NEGATIVE Final    Comment: (NOTE) SARS-CoV-2 target nucleic acids are NOT DETECTED. The SARS-CoV-2 RNA is generally detectable in upper and lower respiratory specimens during the acute phase of infection. Negative results do not preclude SARS-CoV-2 infection, do not rule out co-infections with other pathogens, and should not be used as the sole basis for treatment or other patient management decisions. Negative results must be combined with clinical observations, patient history, and epidemiological information. The expected result is Negative. Fact Sheet  for Patients: SugarRoll.be Fact Sheet for Healthcare Providers: https://www.woods-mathews.com/ This test is not yet approved or cleared by the [place] FDA and  has been authorized for detection and/or diagnosis of SARS-CoV-2 by FDA under an Emergency Use Authorization (EUA). This EUA will remain  in effect (meaning this test can be used) for the duration of the COVID-19 declaration under Section 56 4(b)(1) of the Act, 21 U.S.C. section 360bbb-3(b)(1), unless the authorization is terminated or revoked sooner. Performed at [facility], [place] [address], [place], [place] [address]          Radiology Studies: No results found.      Scheduled Meds: . amLODipine  10 mg Oral Daily  . enoxaparin (LOVENOX) injection  40 mg Subcutaneous Q24H  . metoprolol tartrate  25 mg Oral BID  . nicotine  21 mg Transdermal Daily  . pantoprazole (PROTONIX) IV  40 mg Intravenous Q12H  . sertraline  25 mg Oral Daily  . sodium chloride flush  3 mL Intravenous Q12H  . sucralfate  1 g Oral TID WC & HS  . thiamine  100 mg Oral Daily   Or  . thiamine  100 mg Intravenous Daily   Continuous Infusions: . sodium chloride       LOS: 8 days    Time spent: 36 minutes spent on chart review, discussion with nursing staff, consultants, updating family and interview/physical exam; more than 50% of that time was spent in counseling and/or coordination of care.     Todd [place] DO [facility] 06/27/2019, 7:13 PM

## 2019-06-28 LAB — BASIC METABOLIC PANEL
Anion gap: 9 (ref 5–15)
BUN: 13 mg/dL (ref 8–23)
CO2: 22 mmol/L (ref 22–32)
Calcium: 8.8 mg/dL — ABNORMAL LOW (ref 8.9–10.3)
Chloride: 110 mmol/L (ref 98–111)
Creatinine, Ser: 1.4 mg/dL — ABNORMAL HIGH (ref 0.61–1.24)
GFR calc Af Amer: >60 mL/min (ref >60)
GFR calc non Af Amer: 53 mL/min — ABNORMAL LOW (ref >60)
Glucose, Bld: 104 mg/dL — ABNORMAL HIGH (ref 70–99)
Potassium: 3.8 mmol/L (ref 3.5–5.1)
Sodium: 141 mmol/L (ref 135–145)

## 2019-06-28 LAB — CBC
HCT: 37.8 % — ABNORMAL LOW (ref 39.0–52.0)
Hemoglobin: 12 g/dL — ABNORMAL LOW (ref 13.0–17.0)
MCH: 30.7 pg (ref 26.0–34.0)
MCHC: 31.7 g/dL (ref 30.0–36.0)
MCV: 96.7 fL (ref 80.0–100.0)
Platelets: 213 10*3/uL (ref 150–400)
RBC: 3.91 MIL/uL — ABNORMAL LOW (ref 4.22–5.81)
RDW: 13.7 % (ref 11.5–15.5)
WBC: 4.8 10*3/uL (ref 4.0–10.5)
nRBC: 0 % (ref 0.0–0.2)

## 2019-06-28 LAB — MAGNESIUM: Magnesium: 1.2 mg/dL — ABNORMAL LOW (ref 1.7–2.4)

## 2019-06-28 MED ORDER — MAGNESIUM SULFATE 2 GM/50ML IV SOLN
2.0000 g | INTRAVENOUS | Status: AC
  Administered 2019-06-28 (×2): 2 g via INTRAVENOUS
  Filled 2019-06-28 (×2): qty 50

## 2019-06-28 MED ORDER — PANTOPRAZOLE SODIUM 40 MG PO TBEC
40.0000 mg | DELAYED_RELEASE_TABLET | Freq: Two times a day (BID) | ORAL | Status: DC
  Administered 2019-06-28 – 2019-06-30 (×3): 40 mg via ORAL
  Filled 2019-06-28 (×4): qty 1

## 2019-06-28 MED ORDER — KETOROLAC TROMETHAMINE 15 MG/ML IJ SOLN
15.0000 mg | Freq: Four times a day (QID) | INTRAMUSCULAR | Status: DC | PRN
  Administered 2019-06-28 – 2019-06-30 (×7): 15 mg via INTRAVENOUS
  Filled 2019-06-28 (×7): qty 1

## 2019-06-28 MED ORDER — OXYCODONE HCL 5 MG PO TABS
5.0000 mg | ORAL_TABLET | Freq: Four times a day (QID) | ORAL | Status: DC | PRN
  Administered 2019-06-28 (×3): 5 mg via ORAL
  Filled 2019-06-28 (×3): qty 1

## 2019-06-28 NOTE — Progress Notes (Signed)
PROGRESS NOTE    Todd [name].  [id number] DOB: Jan 03, 1956 DOA: 06/18/2019 PCP: [name], MD    Brief Narrative:   Todd [name]. is a 63 year old male with history of polysubstance abuse alcohol abuse cocaine abuse heroin abuse paroxysmal atrial fibrillation SVT history of DVT pulmonary embolism on and not on anticoagulation diabetes mellitus type 2 esophagitis gastritis hypertension ascending aortic aneurysm was admitted to the emergency room because of abdominal pain of 2 days duration with nausea and vomiting he also had mildly elevated lipase and CT abdomen showed partial distal small bowel obstruction with esophagitis.     Assessment & Plan:   Principal Problem:   Partial small bowel obstruction (HCC) Active Problems:   DM (diabetes mellitus), type 2 ([place])   OBESITY   Essential hypertension   MDD (major depressive disorder), recurrent severe, without psychosis ([place])   Alcohol dependence ([place])   Opioid use disorder, moderate, dependence ([place])  Partial small bowel obstruction Patient presented the ED with 2-day duration of abdominal pain associated with nausea and vomiting.  Initially patient was started on NG tube to low wall suction, IV fluids with resolution of obstruction.  Repeat x-ray abdomen showed nonspecific and nonobstructive bowel gas pattern.  Abdominal pain Gastritis/LA grade D esophagitis Odynophagia Despite resolution of partial SBO as above, patient continued with significant abdominal pain, with nausea/vomiting and continued intolerance to oral intake.  Gastroenterology was consulted and patient underwent EGD on 08/22/2018 with findings of LA grade D esophagitis that was diffuse with gastritis, likely secondary to his history of significant alcohol abuse.  Biopsy from EGD notable for ulcerative esophagitis.  Gastroenterology recommends evaluation with gastric emptying study and possible barium swallow with barium tablet; now signed off and recommend  outpatient follow-up with Dr. Paulita Fujita in 1-2 weeks following hospitalization. --NM gastric emptying study ordered --Continue Carafate 1 g p.o. 3 times daily --Protonix 40 mg IV twice daily --Soft diet --Antiemetics --Discontinue IV morphine --Oxycodone 5 mg p.o. every 6 hours as needed, IV Toradol prn  Hx EtOH abuse Counseled patient on need for complete cessation as it is likely etiology of his underlying abdominal pain, gastritis, esophagitis and odynophagia  Depression Evaluate by psychiatry during hospitalization. --Continue Zoloft 25 mg p.o. daily  Essential hypertension Home medications include losartan 100 mg p.o. daily, metoprolol tartrate 25 mg p.o. twice daily. --Holding losartan secondary to increased creatinine, 1.56-->1.40 today --Continue metoprolol tartrate 25 mg p.o. twice daily --Started amlodipine 10 mg p.o. daily on 11/11 --Continue to monitor blood pressure  Hypokalemia Repleted during hospitalization.   --Repeat BMP with magnesium in the a.m.    DVT prophylaxis: Lovenox Code Status: Full code Family Communication: None Disposition Plan: Continue inpatient hospitalization, further depending on clinical course   Consultants:   [facility]  Procedures:  EGD: - LA Grade D esophagitis of mid and distal esophagus, with sparing of first few cm of the proximal esophagus. Biopsied and brushed. Suspect severe reflux esophagitis (patient hadprior endoscopy six months ago with similar findings and esophageal biopsies at that time showed no infectious etiology and he was not compliant with his PPI therapy). - Esophageal findings highly likely cause of patient's recurrent odynophagia and epigastric abdominal pain. - Small hiatal hernia. - Gastritis. - Normal duodenal bulb, first portion of the duodenum and second portion of the duodenum.  Antimicrobials:   None   Subjective: Patient seen and examined at bedside, sleeping but easily arousable.   Continues to complain of dysphagia and pain with oral intake; although  nursing states has been eating all of his meals.  GI signed off today with no further recommendations other than consideration of gastric emptying study versus barium swallow.  No other complaints or concerns at this time.  Denies headache, no fever/chill/night sweats, no chest pain, no palpitations, no shortness of breath, no cough/congestion.  No acute events overnight per nursing staff.  Objective: Vitals:   06/27/19 0555 06/27/19 1415 06/27/19 2146 06/28/19 0515  BP: (!) 141/75 (!) 152/95 (!) 142/88 (!) 153/91  Pulse: (!) 57 (!) 59 65 66  Resp: 16 18 18 17   Temp: 98.6 F (37 C) 98.7 F (37.1 C) 99.3 F (37.4 C) 98.7 F (37.1 C)  TempSrc: Oral Oral Oral Oral  SpO2: 96% 95% 96% 95%  Weight:        Intake/Output Summary (Last 24 hours) at 06/28/2019 1530 Last data filed at 06/28/2019 1142 Gross per 24 hour  Intake 600 ml  Output 875 ml  Net -275 ml   Filed Weights   06/23/19 0846  Weight: 104.3 kg    Examination:  General exam: Appears calm and comfortable  Respiratory system: Clear to auscultation. Respiratory effort normal. Cardiovascular system: S1 & S2 heard, RRR. No JVD, murmurs, rubs, gallops or clicks. No pedal edema. Gastrointestinal system: Abdomen is nondistended, soft and nontender. No organomegaly or masses felt. Normal bowel sounds heard. Central nervous system: Alert and oriented. No focal neurological deficits. Extremities: Symmetric 5 x 5 power. Skin: No rashes, lesions or ulcers Psychiatry: Judgement and insight appear normal. Mood & affect appropriate.     Data Reviewed: I have personally reviewed following labs and imaging studies  CBC: Recent Labs  Lab 06/22/19 0822 06/28/19 0505  WBC 4.9 4.8  HGB 12.1* 12.0*  HCT 36.7* 37.8*  MCV 94.3 96.7  PLT 240 123456   Basic Metabolic Panel: Recent Labs  Lab 06/22/19 0822 06/27/19 0311 06/28/19 0505  NA 140  --  141  K 4.3  --   3.8  CL 111  --  110  CO2 22  --  22  GLUCOSE 98  --  104*  BUN <5*  --  13  CREATININE 1.03 1.52* 1.40*  CALCIUM 8.7*  --  8.8*  MG  --   --  1.2*   GFR: Estimated Creatinine Clearance: 64.2 mL/min (A) (by C-G formula based on SCr of 1.4 mg/dL (H)). Liver Function Tests: Recent Labs  Lab 06/22/19 0822  AST 15  ALT 14  ALKPHOS 51  BILITOT 0.8  PROT 5.5*  ALBUMIN 2.5*   No results for input(s): LIPASE, AMYLASE in the last 168 hours. No results for input(s): AMMONIA in the last 168 hours. Coagulation Profile: No results for input(s): INR, PROTIME in the last 168 hours. Cardiac Enzymes: No results for input(s): CKTOTAL, CKMB, CKMBINDEX, TROPONINI in the last 168 hours. BNP (last 3 results) No results for input(s): PROBNP in the last 8760 hours. HbA1C: No results for input(s): HGBA1C in the last 72 hours. CBG: Recent Labs  Lab 06/23/19 1608 06/23/19 2104 06/24/19 1057 06/24/19 1715 06/25/19 0800  GLUCAP 90 108* 95 133* 80   Lipid Profile: No results for input(s): CHOL, HDL, LDLCALC, TRIG, CHOLHDL, LDLDIRECT in the last 72 hours. Thyroid Function Tests: No results for input(s): TSH, T4TOTAL, FREET4, T3FREE, THYROIDAB in the last 72 hours. Anemia Panel: No results for input(s): VITAMINB12, FOLATE, FERRITIN, TIBC, IRON, RETICCTPCT in the last 72 hours. Sepsis Labs: No results for input(s): PROCALCITON, LATICACIDVEN in the last 168 hours.  Recent Results (from the past 240 hour(s))  SARS CORONAVIRUS 2 (TAT 6-24 HRS) Nasopharyngeal Nasopharyngeal Swab     Status: None   Collection Time: 06/19/19  4:28 AM   Specimen: Nasopharyngeal Swab  Result Value Ref Range Status   SARS Coronavirus 2 NEGATIVE NEGATIVE Final    Comment: (NOTE) SARS-CoV-2 target nucleic acids are NOT DETECTED. The SARS-CoV-2 RNA is generally detectable in upper and lower respiratory specimens during the acute phase of infection. Negative results do not preclude SARS-CoV-2 infection, do not rule  out co-infections with other pathogens, and should not be used as the sole basis for treatment or other patient management decisions. Negative results must be combined with clinical observations, patient history, and epidemiological information. The expected result is Negative. Fact Sheet for Patients: SugarRoll.be Fact Sheet for Healthcare Providers: https://www.woods-mathews.com/ This test is not yet approved or cleared by the [place] FDA and  has been authorized for detection and/or diagnosis of SARS-CoV-2 by FDA under an Emergency Use Authorization (EUA). This EUA will remain  in effect (meaning this test can be used) for the duration of the COVID-19 declaration under Section 56 4(b)(1) of the Act, 21 U.S.C. section 360bbb-3(b)(1), unless the authorization is terminated or revoked sooner. Performed at [facility], [place] [address] [place], [place] [address]          Radiology Studies: No results found.      Scheduled Meds:  amLODipine  10 mg Oral Daily   enoxaparin (LOVENOX) injection  40 mg Subcutaneous Q24H   metoprolol tartrate  25 mg Oral BID   nicotine  21 mg Transdermal Daily   pantoprazole  40 mg Oral BID   sertraline  25 mg Oral Daily   sodium chloride flush  3 mL Intravenous Q12H   sucralfate  1 g Oral TID WC & HS   thiamine  100 mg Oral Daily   Continuous Infusions:  sodium chloride       LOS: 9 days    Time spent: 34 minutes spent on chart review, discussion with nursing staff, consultants, updating family and interview/physical exam; more than 50% of that time was spent in counseling and/or coordination of care.    Sani Loiseau J [place] DO [facility] 06/28/2019, 3:30 PM

## 2019-06-28 NOTE — Progress Notes (Signed)
Todd [name]. 8:42 AM  Subjective: Patient without any new complaints still with a pain with swallowing says he is walking says he did not get viscous lidocaine yesterday we discussed his pathology and we reviewed his previous work-up  Objective: Vital signs stable afebrile no acute distress abdomen is soft nontender increased creatinine other labs okay biopsies okay no signs of infection or cancer  Assessment: Odynophagia secondary to severe reflux  Plan: I think weaning pain medicine would help in the long run and try to decrease his morphine also consider a gastric emptying study and possibly a barium swallow with barium tablet and consider adding Reglan or erythromycin if delayed gastric emptying and please call us back if we could be of any further assistance otherwise Dr. Paulita Fujita happy to see back in the office in 1 to 2 weeks after discharge  [facility]  office [phone] After 5PM or if no answer call [phone]

## 2019-06-28 NOTE — Plan of Care (Signed)

## 2019-06-29 ENCOUNTER — Inpatient Hospital Stay (HOSPITAL_COMMUNITY): Payer: Medicare Other

## 2019-06-29 LAB — BASIC METABOLIC PANEL
Anion gap: 11 (ref 5–15)
BUN: 13 mg/dL (ref 8–23)
CO2: 22 mmol/L (ref 22–32)
Calcium: 8.6 mg/dL — ABNORMAL LOW (ref 8.9–10.3)
Chloride: 108 mmol/L (ref 98–111)
Creatinine, Ser: 1.21 mg/dL (ref 0.61–1.24)
GFR calc Af Amer: >60 mL/min (ref >60)
GFR calc non Af Amer: >60 mL/min (ref >60)
Glucose, Bld: 100 mg/dL — ABNORMAL HIGH (ref 70–99)
Potassium: 3.5 mmol/L (ref 3.5–5.1)
Sodium: 141 mmol/L (ref 135–145)

## 2019-06-29 MED ORDER — OXYCODONE HCL 5 MG PO TABS
10.0000 mg | ORAL_TABLET | Freq: Four times a day (QID) | ORAL | Status: DC | PRN
  Administered 2019-06-29 (×2): 10 mg via ORAL
  Filled 2019-06-29 (×3): qty 2

## 2019-06-29 MED ORDER — SIMETHICONE 80 MG PO CHEW
160.0000 mg | CHEWABLE_TABLET | Freq: Four times a day (QID) | ORAL | Status: DC | PRN
  Administered 2019-06-29: 160 mg via ORAL
  Filled 2019-06-29: qty 2

## 2019-06-29 MED ORDER — TECHNETIUM TC 99M SULFUR COLLOID
2.1100 | Freq: Once | INTRAVENOUS | Status: AC | PRN
  Administered 2019-06-29: 2.11 via ORAL

## 2019-06-29 NOTE — Care Management Important Message (Signed)
Important Message  Patient Details  Name: Todd [name]. MRN: [id number] Date of Birth: 1955/09/01   Medicare Important Message Given:  Yes     Memory Argue 06/29/2019, 2:30 PM

## 2019-06-29 NOTE — Progress Notes (Signed)
PROGRESS NOTE    Todd [name].  [id number] DOB: 1956-05-21 DOA: 06/18/2019 PCP: [name], MD    Brief Narrative:   Todd [name]. is a 63 year old male with history of polysubstance abuse alcohol abuse cocaine abuse heroin abuse paroxysmal atrial fibrillation SVT history of DVT pulmonary embolism on and not on anticoagulation diabetes mellitus type 2 esophagitis gastritis hypertension ascending aortic aneurysm was admitted to the emergency room because of abdominal pain of 2 days duration with nausea and vomiting he also had mildly elevated lipase and CT abdomen showed partial distal small bowel obstruction with esophagitis.     Assessment & Plan:   Principal Problem:   Partial small bowel obstruction (HCC) Active Problems:   DM (diabetes mellitus), type 2 ([place])   OBESITY   Essential hypertension   MDD (major depressive disorder), recurrent severe, without psychosis ([place])   Alcohol dependence ([place])   Opioid use disorder, moderate, dependence ([place])  Partial small bowel obstruction Patient presented the ED with 2-day duration of abdominal pain associated with nausea and vomiting.  Initially patient was started on NG tube to low wall suction, IV fluids with resolution of obstruction.  Repeat x-ray abdomen showed nonspecific and nonobstructive bowel gas pattern.  Abdominal pain Gastritis/LA grade D esophagitis Odynophagia Despite resolution of partial SBO as above, patient continued with significant abdominal pain, with nausea/vomiting and continued intolerance to oral intake.  Gastroenterology was consulted and patient underwent EGD on 08/22/2018 with findings of LA grade D esophagitis that was diffuse with gastritis, likely secondary to his history of significant alcohol abuse.  Biopsy from EGD notable for ulcerative esophagitis.  Nuclear medicine gastric emptying study on 06/29/2019 normal.  Gastroenterology with no further recommendations and outpatient follow-up with  Dr. Paulita Fujita in 1-2 weeks following hospitalization. --Continue Carafate 1 g p.o. 3 times daily --Incision Protonix from IV to p.o. 40 mg twice daily  --Soft diet --Antiemetics --Oxycodone 10 mg p.o. every 6 hours as needed, IV Toradol prn  Hx EtOH abuse Counseled patient on need for complete cessation as it is likely etiology of his underlying abdominal pain, gastritis, esophagitis and odynophagia  Depression Evaluate by psychiatry during hospitalization. --Continue Zoloft 25 mg p.o. daily  Essential hypertension Home medications include losartan 100 mg p.o. daily, metoprolol tartrate 25 mg p.o. twice daily. --Holding losartan secondary to increased creatinine, 1.56-->1.40-->1.21 today --Continue metoprolol tartrate 25 mg p.o. twice daily --Started amlodipine 10 mg p.o. daily on 11/11 --Continue to monitor blood pressure  Hypokalemia Repleted during hospitalization.   --Repeat BMP with magnesium in the a.m.    DVT prophylaxis: Lovenox Code Status: Full code Family Communication: None Disposition Plan: Continue inpatient hospitalization, further depending on clinical course   Consultants:   [facility]  Procedures:  EGD: - LA Grade D esophagitis of mid and distal esophagus, with sparing of first few cm of the proximal esophagus. Biopsied and brushed. Suspect severe reflux esophagitis (patient hadprior endoscopy six months ago with similar findings and esophageal biopsies at that time showed no infectious etiology and he was not compliant with his PPI therapy). - Esophageal findings highly likely cause of patient's recurrent odynophagia and epigastric abdominal pain. - Small hiatal hernia. - Gastritis. - Normal duodenal bulb, first portion of the duodenum and second portion of the duodenum.  Antimicrobials:   None   Subjective: Patient seen and examined at bedside, following return from nuclear medicine gastric emptying study.  Study was normal.   Continues to complain of difficulty swallowing, only wants to try  soft foods.  GI without any further recommendations other than outpatient follow-up in clinic.  Continue to discussed with patient avoidance of all alcohol, as this is the leading etiology to his symptoms.  States Toradol is not helping his pain.  Requests increase in his oxycodone.  Also reports gas pains and requests medicine for this as well.  No other complaints or concerns at this time.  Denies headache, no fever/chill/night sweats, no chest pain, no palpitations, no shortness of breath, no cough/congestion.  No acute events overnight per nursing staff.  Objective: Vitals:   06/28/19 0515 06/28/19 1541 06/28/19 2138 06/29/19 0635  BP: (!) 153/91 131/78 139/84 140/84  Pulse: 66 62 65 (!) 58  Resp: 17 18 16 15   Temp: 98.7 F (37.1 C) 99.1 F (37.3 C) 98.6 F (37 C) 98.4 F (36.9 C)  TempSrc: Oral Oral Oral Oral  SpO2: 95% 97% 97% 97%  Weight:        Intake/Output Summary (Last 24 hours) at 06/29/2019 1608 Last data filed at 06/29/2019 0852 Gross per 24 hour  Intake 240 ml  Output 350 ml  Net -110 ml   Filed Weights   06/23/19 0846  Weight: 104.3 kg    Examination:  General exam: Appears calm and comfortable  Respiratory system: Clear to auscultation. Respiratory effort normal. Cardiovascular system: S1 & S2 heard, RRR. No JVD, murmurs, rubs, gallops or clicks. No pedal edema. Gastrointestinal system: Abdomen is nondistended, soft and nontender. No organomegaly or masses felt. Normal bowel sounds heard. Central nervous system: Alert and oriented. No focal neurological deficits. Extremities: Symmetric 5 x 5 power. Skin: No rashes, lesions or ulcers Psychiatry: Judgement and insight appear normal. Mood & affect appropriate.     Data Reviewed: I have personally reviewed following labs and imaging studies  CBC: Recent Labs  Lab 06/28/19 0505  WBC 4.8  HGB 12.0*  HCT 37.8*  MCV 96.7  PLT 123456   Basic  Metabolic Panel: Recent Labs  Lab 06/27/19 0311 06/28/19 0505 06/29/19 0414  NA  --  141 141  K  --  3.8 3.5  CL  --  110 108  CO2  --  22 22  GLUCOSE  --  104* 100*  BUN  --  13 13  CREATININE 1.52* 1.40* 1.21  CALCIUM  --  8.8* 8.6*  MG  --  1.2*  --    GFR: Estimated Creatinine Clearance: 74.3 mL/min (by C-G formula based on SCr of 1.21 mg/dL). Liver Function Tests: No results for input(s): AST, ALT, ALKPHOS, BILITOT, PROT, ALBUMIN in the last 168 hours. No results for input(s): LIPASE, AMYLASE in the last 168 hours. No results for input(s): AMMONIA in the last 168 hours. Coagulation Profile: No results for input(s): INR, PROTIME in the last 168 hours. Cardiac Enzymes: No results for input(s): CKTOTAL, CKMB, CKMBINDEX, TROPONINI in the last 168 hours. BNP (last 3 results) No results for input(s): PROBNP in the last 8760 hours. HbA1C: No results for input(s): HGBA1C in the last 72 hours. CBG: Recent Labs  Lab 06/23/19 1608 06/23/19 2104 06/24/19 1057 06/24/19 1715 06/25/19 0800  GLUCAP 90 108* 95 133* 80   Lipid Profile: No results for input(s): CHOL, HDL, LDLCALC, TRIG, CHOLHDL, LDLDIRECT in the last 72 hours. Thyroid Function Tests: No results for input(s): TSH, T4TOTAL, FREET4, T3FREE, THYROIDAB in the last 72 hours. Anemia Panel: No results for input(s): VITAMINB12, FOLATE, FERRITIN, TIBC, IRON, RETICCTPCT in the last 72 hours. Sepsis Labs: No results for input(s):  PROCALCITON, LATICACIDVEN in the last 168 hours.  No results found for this or any previous visit (from the past 240 hour(s)).       Radiology Studies: Nm Gastric Emptying  Result Date: 06/29/2019 CLINICAL DATA:  Nausea vomiting. EXAM: NUCLEAR MEDICINE GASTRIC EMPTYING SCAN TECHNIQUE: After oral ingestion of radiolabeled meal, sequential abdominal images were obtained for 4 hours. Percentage of activity emptying the stomach was calculated at 1 hour, 2 hour, 3 hour, and 4 hours.  RADIOPHARMACEUTICALS:  2.1 mCi Tc-61m sulfur colloid in standardized meal COMPARISON:  CT 06/19/2019. FINDINGS: Expected location of the stomach in the left upper quadrant. Ingested meal empties the stomach gradually over the course of the study. Fifty-one % emptied at 1 hr ( normal >= 10%) Eighty-nine % emptied at 2 hr ( normal >= 40%) Ninety-seven % emptied at 3 hr ( normal >= 70%) IMPRESSION: Normal gastric emptying study. Electronically Signed   By: Marcello Moores  Register   On: 06/29/2019 14:58        Scheduled Meds:  amLODipine  10 mg Oral Daily   enoxaparin (LOVENOX) injection  40 mg Subcutaneous Q24H   metoprolol tartrate  25 mg Oral BID   nicotine  21 mg Transdermal Daily   pantoprazole  40 mg Oral BID   sertraline  25 mg Oral Daily   sodium chloride flush  3 mL Intravenous Q12H   sucralfate  1 g Oral TID WC & HS   thiamine  100 mg Oral Daily   Continuous Infusions:  sodium chloride       LOS: 10 days    Time spent: 34 minutes spent on chart review, discussion with nursing staff, consultants, updating family and interview/physical exam; more than 50% of that time was spent in counseling and/or coordination of care.    Jerri Glauser J [place] DO [facility] 06/29/2019, 4:08 PM

## 2019-06-29 NOTE — Progress Notes (Signed)
[name] 2:22 PM  Subjective: Patient doing much better but still not wanting to eat anything but soft foods and he tolerated the gastric emptying study and has no new complaints  Objective: Vital signs stable afebrile patient does look better in better spirits Creatinine decreased gastric emptying study pending not examined today Assessment: Painful swallowing secondary to severe reflux  Plan: We discussed decreased alcohol make sure to fill the prescription for stomach medicine either  pantoprazole or omeprazole and based on this note will ask the hospital doctors to call that and on discharge and consider low-dose Reglan if significant delayed gastric emptying and try to decrease narcotics which will help as well and please call our partner Dr. Michail Sermon this weekend if any question or problem and Dr. Paulita Fujita happy to see back as an outpatient in a few weeks [facility] E  office [phone] After 5PM or if no answer call [phone]

## 2019-06-30 DIAGNOSIS — K2210 Ulcer of esophagus without bleeding: Secondary | ICD-10-CM | POA: Diagnosis present

## 2019-06-30 MED ORDER — LIDOCAINE VISCOUS HCL 2 % MT SOLN: 15.0000 mL | OROMUCOSAL | 0 refills | Status: AC | PRN

## 2019-06-30 MED ORDER — OXYCODONE HCL 10 MG PO TABS: 10.0000 mg | ORAL_TABLET | Freq: Four times a day (QID) | ORAL | 0 refills | Status: AC | PRN

## 2019-06-30 MED ORDER — SERTRALINE HCL 25 MG PO TABS: 25.0000 mg | ORAL_TABLET | Freq: Every day | ORAL | 0 refills | Status: DC

## 2019-06-30 MED ORDER — SUCRALFATE 1 GM/10ML PO SUSP: 1.0000 g | Freq: Three times a day (TID) | ORAL | 0 refills | Status: DC

## 2019-06-30 MED ORDER — METOPROLOL TARTRATE 25 MG PO TABS: 25.0000 mg | ORAL_TABLET | Freq: Two times a day (BID) | ORAL | 0 refills | Status: DC

## 2019-06-30 MED ORDER — AMLODIPINE BESYLATE 10 MG PO TABS: 10.0000 mg | ORAL_TABLET | Freq: Every day | ORAL | 0 refills | Status: DC

## 2019-06-30 MED ORDER — PANTOPRAZOLE SODIUM 40 MG PO TBEC: 40.0000 mg | DELAYED_RELEASE_TABLET | Freq: Two times a day (BID) | ORAL | 1 refills | Status: DC

## 2019-06-30 NOTE — Progress Notes (Signed)
Patient discharged to home with instructions. 

## 2019-06-30 NOTE — Discharge Summary (Signed)
Physician Discharge Summary  Todd [name]. SS:[id number] DOB: 1955-08-30 DOA: 06/18/2019  PCP: [name], MD  Admit date: 06/18/2019 Discharge date: 06/30/2019  Admitted From: Home Disposition: Home  Recommendations for Outpatient Follow-up:  1. Follow up with PCP in 1-2 weeks 2. Follow-up with gastroenterology, Dr. Paulita Fujita in 2 weeks 3. Discontinued losartan secondary to renal insufficiency 4. Start amlodipine 10 mg p.o. daily 5. Repeat BMP in 1-2 weeks with follow-up with PCP, also monitor BP 6. Encourage continue alcohol cessation and adherence to outpatient substance abuse program  Home Health: No Equipment/Devices: None  Discharge Condition: Stable CODE STATUS: Full code Diet recommendation: Heart Healthy   History of present illness:  Todd [name]. is a 63 year old male with history of polysubstance abuse alcohol abuse cocaine abuse heroin abuse paroxysmal atrial fibrillation SVT history of DVT pulmonary embolism on and not on anticoagulation diabetes mellitus type 2 esophagitis gastritis hypertension ascending aortic aneurysm was admitted to the emergency room because of abdominal pain of 2 days duration with nausea and vomiting he also had mildly elevated lipase and CT abdomen showed partial distal small bowel obstruction with esophagitis.   Hospital course:  Partial small bowel obstruction Patient presented the ED with 2-day duration of abdominal pain associated with nausea and vomiting.  Initially patient was started on NG tube to low wall suction, IV fluids with resolution of obstruction.  Repeat x-ray abdomen showed nonspecific and nonobstructive bowel gas pattern.  Abdominal pain Gastritis/LA grade D esophagitis Odynophagia Despite resolution of partial SBO as above, patient continued with significant abdominal pain, with nausea/vomiting and continued intolerance to oral intake.  Gastroenterology was consulted and patient underwent EGD on 08/22/2018 with  findings of LA grade D esophagitis that was diffuse with gastritis, likely secondary to his history of significant alcohol abuse.  Biopsy from EGD notable for ulcerative esophagitis.  Nuclear medicine gastric emptying study on 06/29/2019 normal.  Gastroenterology with no further recommendations and outpatient follow-up with Dr. Paulita Fujita in 1-2 weeks following hospitalization. Continue Carafate 1 g p.o. 3 times daily, Protonix 40 mg PO twice daily, oxycodone prn and viscous lidocaine as needed.   Continue to encourage complete alcohol cessation.  Hx EtOH abuse Counseled patient on need for complete cessation as it is likely etiology of his underlying abdominal pain, gastritis, esophagitis and odynophagia.  Social work provided information for outpatient substance abuse programs.  Depression Evaluate by psychiatry during hospitalization. Continue Zoloft 25 mg p.o. daily  Essential hypertension Home medications include losartan 100 mg p.o. daily, metoprolol tartrate 25 mg p.o. twice daily.  Continued home metoprolol 25 mg p.o. twice daily, started amlodipine 10 mg p.o. daily.  Discontinued home losartan secondary to renal insufficiency.  Recommend follow-up with PCP in 1-2 weeks with repeat BMP and blood pressure check.  Hypokalemia Repleted during hospitalization.     Discharge Diagnoses:  Principal Problem:   Ulcerative esophagitis Active Problems:   DM (diabetes mellitus), type 2 ([place])   OBESITY   Essential hypertension   MDD (major depressive disorder), recurrent severe, without psychosis (HCC)   Alcohol dependence (HCC)   Opioid use disorder, moderate, dependence ([place])    Discharge Instructions  Discharge Instructions    Call MD for:  difficulty breathing, headache or visual disturbances   Complete by: As directed    Call MD for:  extreme fatigue   Complete by: As directed    Call MD for:  persistant dizziness or light-headedness   Complete by: As directed    Call MD for:  persistant nausea and vomiting   Complete by: As directed    Call MD for:  severe uncontrolled pain   Complete by: As directed    Call MD for:  temperature >100.4   Complete by: As directed    Diet - low sodium heart healthy   Complete by: As directed    Increase activity slowly   Complete by: As directed      Allergies as of 06/30/2019   No Known Allergies     Medication List    STOP taking these medications   atorvastatin 10 MG tablet Commonly known as: LIPITOR   loperamide 2 MG capsule Commonly known as: IMODIUM   losartan 100 MG tablet Commonly known as: COZAAR   omeprazole 20 MG capsule Commonly known as: PriLOSEC   potassium chloride 10 MEQ tablet Commonly known as: KLOR-CON   traZODone 100 MG tablet Commonly known as: DESYREL     TAKE these medications   amLODipine 10 MG tablet Commonly known as: NORVASC Take 1 tablet (10 mg total) by mouth daily.   lidocaine 2 % solution Commonly known as: XYLOCAINE Use as directed 15 mLs in the mouth or throat every 4 (four) hours as needed for up to 15 days for mouth pain.   metoprolol tartrate 25 MG tablet Commonly known as: LOPRESSOR Take 1 tablet (25 mg total) by mouth 2 (two) times daily.   Oxycodone HCl 10 MG Tabs Take 1 tablet (10 mg total) by mouth every 6 (six) hours as needed for up to 5 days for moderate pain.   pantoprazole 40 MG tablet Commonly known as: PROTONIX Take 1 tablet (40 mg total) by mouth 2 (two) times daily.   sertraline 25 MG tablet Commonly known as: ZOLOFT Take 1 tablet (25 mg total) by mouth daily.   sucralfate 1 GM/10ML suspension Commonly known as: CARAFATE Take 10 mLs (1 g total) by mouth 4 (four) times daily -  with meals and at bedtime.       No Known Allergies  Consultations:  [facility], Dr. Paulita Fujita   Procedures/Studies: Dg Chest 2 View  Result Date: 06/18/2019 CLINICAL DATA:  Chest pain EXAM: CHEST - 2 VIEW COMPARISON:  04/07/2019 FINDINGS: Heart  and mediastinal contours are within normal limits. No focal opacities or effusions. No acute bony abnormality. IMPRESSION: No active cardiopulmonary disease. Electronically Signed   By: Rolm Baptise M.D.   On: 06/18/2019 19:51   Nm Gastric Emptying  Result Date: 06/29/2019 CLINICAL DATA:  Nausea vomiting. EXAM: NUCLEAR MEDICINE GASTRIC EMPTYING SCAN TECHNIQUE: After oral ingestion of radiolabeled meal, sequential abdominal images were obtained for 4 hours. Percentage of activity emptying the stomach was calculated at 1 hour, 2 hour, 3 hour, and 4 hours. RADIOPHARMACEUTICALS:  2.1 mCi Tc-79m sulfur colloid in standardized meal COMPARISON:  CT 06/19/2019. FINDINGS: Expected location of the stomach in the left upper quadrant. Ingested meal empties the stomach gradually over the course of the study. Fifty-one % emptied at 1 hr ( normal >= 10%) Eighty-nine % emptied at 2 hr ( normal >= 40%) Ninety-seven % emptied at 3 hr ( normal >= 70%) IMPRESSION: Normal gastric emptying study. Electronically Signed   By: Marcello Moores  Register   On: 06/29/2019 14:58   Ct Abdomen Pelvis W Contrast  Result Date: 06/19/2019 CLINICAL DATA:  Acute generalized abdominal pain EXAM: CT ABDOMEN AND PELVIS WITH CONTRAST TECHNIQUE: Multidetector CT imaging of the abdomen and pelvis was performed using the standard protocol following bolus administration of intravenous contrast.  CONTRAST:  125mL OMNIPAQUE IOHEXOL 300 MG/ML  SOLN COMPARISON:  01/09/2019 FINDINGS: Lower chest: Scarring in the lung bases. Diffuse wall thickening in the distal esophagus compatible with esophagitis. No effusions. Hepatobiliary: No focal liver abnormality is seen. Status post cholecystectomy. No biliary dilatation. Pancreas: No focal abnormality.  No ductal dilatation. Spleen: No focal abnormality.  Normal size. Adrenals/Urinary Tract: Small bilateral renal cysts. No hydronephrosis. 2.5 cm nodule in the right adrenal gland is stable. No renal or ureteral stones. No  hydronephrosis. Stomach/Bowel: Dilated small bowel loops in the abdomen and pelvis. Distal small bowel loops are decompressed. Air-fluid levels present. Findings concerning for distal small bowel obstruction. Appendix is normal. Sigmoid diverticulosis. No active diverticulitis. Vascular/Lymphatic: No evidence of aneurysm or adenopathy. Reproductive: No visible focal abnormality. Other: No free fluid or free air. Musculoskeletal: No acute bony abnormality. IMPRESSION: Dilated small bowel with air-fluid levels. Distal small bowel is decompressed. Findings concerning for distal partial small bowel obstruction. Circumferential wall thickening in the distal esophagus compatible with esophagitis. Sigmoid diverticulosis.  No active diverticulitis. Electronically Signed   By: Rolm Baptise M.D.   On: 06/19/2019 02:54   Dg Abd Portable 2v  Result Date: 06/20/2019 CLINICAL DATA:  Upper abdominal pain 3 weeks. Assess for small bowel obstruction. EXAM: PORTABLE ABDOMEN - 2 VIEW COMPARISON:  CT 01/09/2019 and chest x-ray 07/24/2018 FINDINGS: Supine AP view and right lateral decubitus views were obtained. There are a few air-filled nondilated small bowel loops over the right mid to lower abdomen and left mid to upper abdomen. No significant air-fluid levels. No evidence of free peritoneal air. Air and stool present over the rectosigmoid colon. Surgical clips over the right upper quadrant. Multiple surgical clips over the left mid to lower abdomen likely due to previous abdominal wall hernia repair. Degenerative change of the spine and hips. IMPRESSION: Nonspecific, nonobstructive bowel gas pattern. Electronically Signed   By: Marin Olp M.D.   On: 06/20/2019 07:26    (Echo, Carotid, EGD, Colonoscopy, ERCP)    Subjective:   Discharge Exam: Vitals:   06/29/19 2040 06/30/19 0621  BP: 133/72 (!) 150/68  Pulse: (!) 41 71  Resp: 17 17  Temp: 98.4 F (36.9 C) 98.9 F (37.2 C)  SpO2: 99% 98%   Vitals:    06/29/19 0635 06/29/19 1626 06/29/19 2040 06/30/19 0621  BP: 140/84 (!) 155/91 133/72 (!) 150/68  Pulse: (!) 58 (!) 52 (!) 41 71  Resp: 15 18 17 17   Temp: 98.4 F (36.9 C) 98.1 F (36.7 C) 98.4 F (36.9 C) 98.9 F (37.2 C)  TempSrc: Oral Oral Oral Oral  SpO2: 97% 96% 99% 98%  Weight:        General: Pt is alert, awake, not in acute distress Cardiovascular: RRR, S1/S2 +, no rubs, no gallops Respiratory: CTA bilaterally, no wheezing, no rhonchi Abdominal: Soft, NT, ND, bowel sounds + Extremities: no edema, no cyanosis    The results of significant diagnostics from this hospitalization (including imaging, microbiology, ancillary and laboratory) are listed below for reference.     Microbiology: No results found for this or any previous visit (from the past 240 hour(s)).   Labs: BNP (last 3 results) Recent Labs    07/13/18 2219 07/25/18 0021  BNP 146.6* 0000000*   Basic Metabolic Panel: Recent Labs  Lab 06/27/19 0311 06/28/19 0505 06/29/19 0414  NA  --  141 141  K  --  3.8 3.5  CL  --  110 108  CO2  --  22  22  GLUCOSE  --  104* 100*  BUN  --  13 13  CREATININE 1.52* 1.40* 1.21  CALCIUM  --  8.8* 8.6*  MG  --  1.2*  --    Liver Function Tests: No results for input(s): AST, ALT, ALKPHOS, BILITOT, PROT, ALBUMIN in the last 168 hours. No results for input(s): LIPASE, AMYLASE in the last 168 hours. No results for input(s): AMMONIA in the last 168 hours. CBC: Recent Labs  Lab 06/28/19 0505  WBC 4.8  HGB 12.0*  HCT 37.8*  MCV 96.7  PLT 213   Cardiac Enzymes: No results for input(s): CKTOTAL, CKMB, CKMBINDEX, TROPONINI in the last 168 hours. BNP: Invalid input(s): POCBNP CBG: Recent Labs  Lab 06/23/19 1608 06/23/19 2104 06/24/19 1057 06/24/19 1715 06/25/19 0800  GLUCAP 90 108* 95 133* 80   D-Dimer No results for input(s): DDIMER in the last 72 hours. Hgb A1c No results for input(s): HGBA1C in the last 72 hours. Lipid Profile No results for  input(s): CHOL, HDL, LDLCALC, TRIG, CHOLHDL, LDLDIRECT in the last 72 hours. Thyroid function studies No results for input(s): TSH, T4TOTAL, T3FREE, THYROIDAB in the last 72 hours.  Invalid input(s): FREET3 Anemia work up No results for input(s): VITAMINB12, FOLATE, FERRITIN, TIBC, IRON, RETICCTPCT in the last 72 hours. Urinalysis    Component Value Date/Time   COLORURINE YELLOW 04/03/2019 1722   APPEARANCEUR CLOUDY (A) 04/03/2019 1722   LABSPEC 1.010 04/03/2019 1722   PHURINE 6.0 04/03/2019 1722   GLUCOSEU 50 (A) 04/03/2019 1722   HGBUR SMALL (A) 04/03/2019 1722   BILIRUBINUR NEGATIVE 04/03/2019 1722   KETONESUR NEGATIVE 04/03/2019 1722   PROTEINUR 100 (A) 04/03/2019 1722   UROBILINOGEN 0.2 12/24/2009 1944   NITRITE NEGATIVE 04/03/2019 1722   LEUKOCYTESUR NEGATIVE 04/03/2019 1722   Sepsis Labs Invalid input(s): PROCALCITONIN,  WBC,  LACTICIDVEN Microbiology No results found for this or any previous visit (from the past 240 hour(s)).   Time coordinating discharge: Over 30 minutes  SIGNED:   Kaycen Whitworth J [place] DO  [facility] 06/30/2019, 8:34 AM

## 2019-06-30 NOTE — Discharge Instructions (Signed)
Alcohol Use Disorder °Alcohol use disorder is when your drinking disrupts your daily life. When you have this condition, you drink too much alcohol and you cannot control your drinking. °Alcohol use disorder can cause serious problems with your physical health. It can affect your brain, heart, liver, pancreas, immune system, stomach, and intestines. Alcohol use disorder can increase your risk for certain cancers and cause problems with your mental health, such as depression, anxiety, psychosis, delirium, and dementia. People with this disorder risk hurting themselves and others. °What are the causes? °This condition is caused by drinking too much alcohol over time. It is not caused by drinking too much alcohol only one or two times. Some people with this condition drink alcohol to cope with or escape from negative life events. Others drink to relieve pain or symptoms of mental illness. °What increases the risk? °You are more likely to develop this condition if: °· You have a family history of alcohol use disorder. °· Your culture encourages drinking to the point of intoxication, or makes alcohol easy to get. °· You had a mood or conduct disorder in childhood. °· You have been a victim of abuse. °· You are an adolescent and: °? You have poor grades or difficulties in school. °? Your caregivers do not talk to you about saying no to alcohol, or supervise your activities. °? You are impulsive or you have trouble with self-control. °What are the signs or symptoms? °Symptoms of this condition include: °· Drinking more than you want to. °· Drinking for longer than you want to. °· Trying several times to drink less or to control your drinking. °· Spending a lot of time getting alcohol, drinking, or recovering from drinking. °· Craving alcohol. °· Having problems at work, at school, or at home due to drinking. °· Having problems in relationships due to drinking. °· Drinking when it is dangerous to drink, such as before  driving a car. °· Continuing to drink even though you know you might have a physical or mental problem related to drinking. °· Needing more and more alcohol to get the same effect you want from the alcohol (building up tolerance). °· Having symptoms of withdrawal when you stop drinking. Symptoms of withdrawal include: °? Fatigue. °? Nightmares. °? Trouble sleeping. °? Depression. °? Anxiety. °? Fever. °? Seizures. °? Severe confusion. °? Feeling or seeing things that are not there (hallucinations). °? Tremors. °? Rapid heart rate. °? Rapid breathing. °? High blood pressure. °· Drinking to avoid symptoms of withdrawal. °How is this diagnosed? °This condition is diagnosed with an assessment. Your health care provider may start the assessment by asking three or four questions about your drinking. °Your health care provider may perform a physical exam or do lab tests to see if you have physical problems resulting from alcohol use. She or he may refer you to a mental health professional for evaluation. °How is this treated? °Some people with alcohol use disorder are able to reduce their alcohol use to low-risk levels. Others need to completely quit drinking alcohol. When necessary, mental health professionals with specialized training in substance use treatment can help. Your health care provider can help you decide how severe your alcohol use disorder is and what type of treatment you need. The following forms of treatment are available: °· Detoxification. Detoxification involves quitting drinking and using prescription medicines within the first week to help lessen withdrawal symptoms. This treatment is important for people who have had withdrawal symptoms before and for heavy drinkers   who are likely to have withdrawal symptoms. Alcohol withdrawal can be dangerous, and in severe cases, it can cause death. Detoxification may be provided in a home, community, or primary care setting, or in a hospital or substance use  treatment facility. °· Counseling. This treatment is also called talk therapy. It is provided by substance use treatment counselors. A counselor can address the reasons you use alcohol and suggest ways to keep you from drinking again or to prevent problem drinking. The goals of talk therapy are to: °? Find healthy activities and ways for you to cope with stress. °? Identify and avoid the things that trigger your alcohol use. °? Help you learn how to handle cravings. °· Medicines. Medicines can help treat alcohol use disorder by: °? Decreasing alcohol cravings. °? Decreasing the positive feeling you have when you drink alcohol. °? Causing an uncomfortable physical reaction when you drink alcohol (aversion therapy). °· Support groups. Support groups are led by people who have quit drinking. They provide emotional support, advice, and guidance. °These forms of treatment are often combined. Some people with this condition benefit from a combination of treatments provided by specialized substance use treatment centers. °Follow these instructions at home: °· Take over-the-counter and prescription medicines only as told by your health care provider. °· Check with your health care provider before starting any new medicines. °· Ask friends and family members not to offer you alcohol. °· Avoid situations where alcohol is served, including gatherings where others are drinking alcohol. °· Create a plan for what to do when you are tempted to use alcohol. °· Find hobbies or activities that you enjoy that do not include alcohol. °· Keep all follow-up visits as told by your health care provider. This is important. °How is this prevented? °· If you drink, limit alcohol intake to no more than 1 drink a day for nonpregnant women and 2 drinks a day for men. One drink equals 12 oz of beer, 5 oz of wine, or 1½ oz of hard liquor. °· If you have a mental health condition, get treatment and support. °· Do not give alcohol to  adolescents. °· If you are an adolescent: °? Do not drink alcohol. °? Do not be afraid to say no if someone offers you alcohol. Speak up about why you do not want to drink. You can be a positive role model for your friends and set a good example for those around you by not drinking alcohol. °? If your friends drink, spend time with others who do not drink alcohol. Make new friends who do not use alcohol. °? Find healthy ways to manage stress and emotions, such as meditation or deep breathing, exercise, spending time in nature, listening to music, or talking with a trusted friend or family member. °Contact a health care provider if: °· You are not able to take your medicines as told. °· Your symptoms get worse. °· You return to drinking alcohol (relapse) and your symptoms get worse. °Get help right away if: °· You have thoughts about hurting yourself or others. °If you ever feel like you may hurt yourself or others, or have thoughts about taking your own life, get help right away. You can go to your nearest emergency department or call: °· Your local emergency services (911 in the [place] °· A suicide crisis helpline, such as the National Suicide Prevention Lifeline at 1-800-273-8255. This is open 24 hours a day. °Summary °· Alcohol use disorder is when your drinking disrupts your daily   life. When you have this condition, you drink too much alcohol and you cannot control your drinking.  Treatment may include detoxification, counseling, medicine, and support groups.  Ask friends and family members not to offer you alcohol. Avoid situations where alcohol is served.  Get help right away if you have thoughts about hurting yourself or others. This information is not intended to replace advice given to you by your health care provider. Make sure you discuss any questions you have with your health care provider. Document Released: 09/09/2004 Document Revised: 07/15/2017 Document Reviewed: 04/29/2016 Elsevier Patient  Education  [place]. Esophagitis  Esophagitis is inflammation of the esophagus. The esophagus is the tube that carries food from your mouth to your stomach. Esophagitis can cause soreness or pain in the esophagus. This condition can make it difficult and painful to swallow. What are the causes? Most causes of esophagitis are not serious. Common causes of this condition include:  Gastroesophageal reflux disease (GERD). This is when stomach contents move back up into the esophagus (reflux).  Repeated vomiting.  An allergic reaction, especially caused by food allergies (eosinophilic esophagitis).  Injury to the esophagus by swallowing large pills with or without water, or swallowing certain types of medicines.  Swallowing (ingesting) harmful chemicals, such as household cleaning products.  Heavy alcohol use.  An infection of the esophagus. This most often occurs in people who have a weakened immune system.  Radiation or chemotherapy treatment for cancer.  Certain diseases such as sarcoidosis, Crohn's disease, and scleroderma. What are the signs or symptoms? Symptoms of this condition include:  Difficult or painful swallowing.  Pain with swallowing acidic liquids, such as citrus juices.  Pain with burping.  Chest pain.  Difficulty breathing.  Nausea.  Vomiting.  Pain in the abdomen.  Weight loss.  Ulcers in the mouth.  Patches of white material in the mouth (candidiasis).  Fever.  Coughing up blood or vomiting blood.  Stool that is black, tarry, or bright red. How is this diagnosed? Your health care provider will take a medical history and perform a physical exam. You may also have other tests, including:  An endoscopy to examine your esophagus and stomach with a small flexible tube with a camera.  A test that measures the acidity level in your esophagus.  A test that measures how much pressure is on your esophagus.  A barium swallow or modified  barium swallow to show the shape, size, and functioning of your esophagus.  Allergy tests. How is this treated? Treatment for this condition depends on the cause of your esophagitis. In some cases, steroids or other medicines may be given to help relieve your symptoms or to treat the underlying cause of your condition. You may have to make some lifestyle changes, such as:  Avoiding alcohol.  Quitting smoking.  Changing your diet.  Exercising.  Changing your sleep habits and your sleep environment. Follow these instructions at home: Medicines  Take over-the-counter and prescription medicines only as told by your health care provider.  Do not take aspirin, ibuprofen, or other NSAIDs unless your health care provider told you to do so.  If you have trouble taking pills: ? Use a pill splitter to decrease the size of the pill. This will decrease the chance of the pill getting stuck or injuring your esophagus. ? Drink water after you take a pill. Eating and drinking   Avoid foods and drinks that seem to make your symptoms worse.  Follow a diet as recommended  by your health care provider. This may involve avoiding foods and drinks such as: ? Coffee and tea (with or without caffeine). ? Drinks that contain alcohol. ? Energy drinks and sports drinks. ? Carbonated drinks or sodas. ? Chocolate and cocoa. ? Peppermint and mint flavorings. ? Garlic and onions. ? Horseradish. ? Spicy and acidic foods, including peppers, chili powder, curry powder, vinegar, hot sauces, and barbecue sauce. ? Citrus fruit juices and citrus fruits, such as oranges, lemons, and limes. ? Tomato-based foods, such as red sauce, chili, salsa, and pizza with red sauce. ? Fried and fatty foods, such as donuts, french fries, potato chips, and high-fat dressings. ? High-fat meats, such as hot dogs and fatty cuts of red and white meats, such as rib eye steak, sausage, ham, and bacon. ? High-fat dairy items, such as  whole milk, butter, and cream cheese. Lifestyle  Eat small, frequent meals instead of large meals.  Avoid drinking large amounts of liquid with your meals.  Avoid eating meals during the 2-3 hours before bedtime.  Avoid lying down right after you eat.  Do not exercise right after you eat.  Do not use any products that contain nicotine or tobacco, such as cigarettes and e-cigarettes. If you need help quitting, ask your health care provider. General instructions   Pay attention to any changes in your symptoms. Let your health care provider know about them.  Wear loose-fitting clothing. Do not wear anything tight around your waist that causes pressure on your abdomen.  Raise (elevate) the head of your bed about 6 inches (15 cm).  Try relaxation strategies such as yoga, deep breathing, or meditation to manage stress. If you need help reducing stress, ask your health care provider.  If you are overweight, reduce your weight to an amount that is healthy for you. Ask your health care provider for guidance about a safe weight loss goal.  Keep all follow-up visits as told by your health care provider. This is important. Contact a health care provider if:  You have new symptoms.  You have unexplained weight loss.  You have difficulty swallowing, or it hurts to swallow.  You have wheezing or a cough that does not go away.  Your symptoms do not improve with treatment.  You have frequent heartburn for more than two weeks. Get help right away if:  You have severe pain in your arms, neck, jaw, teeth, or back.  You feel sweaty, dizzy, or light-headed.  You have chest pain or shortness of breath.  You vomit and your vomit looks like blood or coffee grounds.  Your stool is bloody or black.  You have a fever.  You cannot swallow, drink, or eat. Summary  Esophagitis is inflammation of the esophagus.  Most causes of esophagitis are not serious.  Follow your health care  provider's instructions about eating and drinking. Follow instructions on medicines.  Contact a health care provider if you have new symptoms, have weight loss, or coughing that does not stop.  Get help right away if you have severe pain in the arms, neck, jaw, teeth or back, or if you have chest pain, shortness of breath, or fever. This information is not intended to replace advice given to you by your health care provider. Make sure you discuss any questions you have with your health care provider. Document Released: 09/09/2004 Document Revised: 03/24/2018 Document Reviewed: 03/24/2018 Elsevier Patient Education  2020 [employer].

## 2019-07-02 ENCOUNTER — Other Ambulatory Visit: Payer: Self-pay

## 2019-09-26 ENCOUNTER — Other Ambulatory Visit: Payer: Self-pay

## 2019-09-26 ENCOUNTER — Emergency Department (HOSPITAL_COMMUNITY): Payer: Medicare Other

## 2019-09-26 ENCOUNTER — Emergency Department (HOSPITAL_COMMUNITY)
Admission: EM | Admit: 2019-09-26 | Discharge: 2019-09-26 | Disposition: A | Discharge status: Home / Self Care | Payer: Medicare Other | Attending: Emergency Medicine | Admitting: Emergency Medicine

## 2019-09-26 ENCOUNTER — Encounter (HOSPITAL_COMMUNITY): Payer: Self-pay | Admitting: Emergency Medicine

## 2019-09-26 DIAGNOSIS — F17210 Nicotine dependence, cigarettes, uncomplicated: Secondary | ICD-10-CM | POA: Diagnosis not present

## 2019-09-26 DIAGNOSIS — Z8546 Personal history of malignant neoplasm of prostate: Secondary | ICD-10-CM | POA: Insufficient documentation

## 2019-09-26 DIAGNOSIS — Z86718 Personal history of other venous thrombosis and embolism: Secondary | ICD-10-CM | POA: Insufficient documentation

## 2019-09-26 DIAGNOSIS — I10 Essential (primary) hypertension: Secondary | ICD-10-CM | POA: Diagnosis not present

## 2019-09-26 DIAGNOSIS — R1013 Epigastric pain: Secondary | ICD-10-CM | POA: Insufficient documentation

## 2019-09-26 DIAGNOSIS — Z86711 Personal history of pulmonary embolism: Secondary | ICD-10-CM | POA: Diagnosis not present

## 2019-09-26 LAB — CBC
HCT: 45.6 % (ref 39.0–52.0)
Hemoglobin: 15.1 g/dL (ref 13.0–17.0)
MCH: 31.3 pg (ref 26.0–34.0)
MCHC: 33.1 g/dL (ref 30.0–36.0)
MCV: 94.6 fL (ref 80.0–100.0)
Platelets: 311 10*3/uL (ref 150–400)
RBC: 4.82 MIL/uL (ref 4.22–5.81)
RDW: 13.2 % (ref 11.5–15.5)
WBC: 6.2 10*3/uL (ref 4.0–10.5)
nRBC: 0 % (ref 0.0–0.2)

## 2019-09-26 LAB — TROPONIN I (HIGH SENSITIVITY)
Troponin I (High Sensitivity): 23 ng/L — ABNORMAL HIGH (ref <18)
Troponin I (High Sensitivity): 26 ng/L — ABNORMAL HIGH (ref <18)

## 2019-09-26 LAB — HEPATIC FUNCTION PANEL
ALT: 13 U/L (ref 0–44)
AST: 17 U/L (ref 15–41)
Albumin: 3.3 g/dL — ABNORMAL LOW (ref 3.5–5.0)
Alkaline Phosphatase: 57 U/L (ref 38–126)
Bilirubin, Direct: 0.1 mg/dL (ref 0.0–0.2)
Indirect Bilirubin: 0.7 mg/dL (ref 0.3–0.9)
Total Bilirubin: 0.8 mg/dL (ref 0.3–1.2)
Total Protein: 7 g/dL (ref 6.5–8.1)

## 2019-09-26 LAB — BASIC METABOLIC PANEL
Anion gap: 13 (ref 5–15)
BUN: 11 mg/dL (ref 8–23)
CO2: 18 mmol/L — ABNORMAL LOW (ref 22–32)
Calcium: 9.4 mg/dL (ref 8.9–10.3)
Chloride: 100 mmol/L (ref 98–111)
Creatinine, Ser: 1.51 mg/dL — ABNORMAL HIGH (ref 0.61–1.24)
GFR calc Af Amer: 56 mL/min — ABNORMAL LOW (ref >60)
GFR calc non Af Amer: 48 mL/min — ABNORMAL LOW (ref >60)
Glucose, Bld: 108 mg/dL — ABNORMAL HIGH (ref 70–99)
Potassium: 3.6 mmol/L (ref 3.5–5.1)
Sodium: 131 mmol/L — ABNORMAL LOW (ref 135–145)

## 2019-09-26 LAB — LIPASE, BLOOD: Lipase: 51 U/L (ref 11–51)

## 2019-09-26 LAB — ETHANOL: Alcohol, Ethyl (B): <10 mg/dL (ref <10)

## 2019-09-26 MED ORDER — PANTOPRAZOLE SODIUM 40 MG PO TBEC: 40.0000 mg | DELAYED_RELEASE_TABLET | Freq: Two times a day (BID) | ORAL | 0 refills | Status: DC

## 2019-09-26 MED ORDER — SUCRALFATE 1 GM/10ML PO SUSP: 1.0000 g | Freq: Three times a day (TID) | ORAL | 0 refills | Status: DC

## 2019-09-26 MED ORDER — SODIUM CHLORIDE 0.9% FLUSH
3.0000 mL | Freq: Once | INTRAVENOUS | Status: AC
  Administered 2019-09-26: 3 mL via INTRAVENOUS

## 2019-09-26 MED ORDER — ALUM & MAG HYDROXIDE-SIMETH 200-200-20 MG/5ML PO SUSP
30.0000 mL | Freq: Once | ORAL | Status: AC
  Administered 2019-09-26: 30 mL via ORAL
  Filled 2019-09-26: qty 30

## 2019-09-26 MED ORDER — SODIUM CHLORIDE 0.9 % IV BOLUS
1000.0000 mL | Freq: Once | INTRAVENOUS | Status: AC
  Administered 2019-09-26: 1000 mL via INTRAVENOUS

## 2019-09-26 MED ORDER — SUCRALFATE 1 GM/10ML PO SUSP
1.0000 g | Freq: Three times a day (TID) | ORAL | Status: DC
  Filled 2019-09-26: qty 10

## 2019-09-26 MED ORDER — HALOPERIDOL LACTATE 5 MG/ML IJ SOLN
5.0000 mg | Freq: Once | INTRAMUSCULAR | Status: AC
  Administered 2019-09-26: 5 mg via INTRAVENOUS
  Filled 2019-09-26: qty 1

## 2019-09-26 MED ORDER — PANTOPRAZOLE SODIUM 40 MG IV SOLR
40.0000 mg | Freq: Once | INTRAVENOUS | Status: AC
  Administered 2019-09-26: 40 mg via INTRAVENOUS
  Filled 2019-09-26: qty 40

## 2019-09-26 MED ORDER — LIDOCAINE VISCOUS HCL 2 % MT SOLN
15.0000 mL | Freq: Once | OROMUCOSAL | Status: AC
  Administered 2019-09-26: 15 mL via ORAL
  Filled 2019-09-26: qty 15

## 2019-09-26 NOTE — ED Notes (Signed)
RN discussed discharge instructions with pt. Pt to follow up with GI and continue prescribe medications at home. Pt verbalized understanding but states he refuses to leave at this time. Security at bedside.   RN informed pt how to call for a ride home using the hospital phone. Pt upset and yelled to RN "get out of my face."   Security escorting pt from room.

## 2019-09-26 NOTE — ED Notes (Signed)
Pt ate 100% of apple sauce and tolerated well. ED provider made aware.

## 2019-09-26 NOTE — ED Provider Notes (Signed)
[place] [facility] EMERGENCY DEPARTMENT Provider Note   CSN: [id number] Arrival date & time: 09/26/19  1235     History No chief complaint on file.   Todd [name]. is a 64 y.o. male presenting for evaluation of chest and upper abdominal pain.  Patient states he has had gradually worsening pain of his chest and upper abdomen.  Pain is worse when he tries to eat or drink.  As such, he has had "nothing to eat or drink for 5 days."  Patient states he continues to drink alcohol daily.  He states he was seen by GI in the fall, found to have esophagitis.  Started on Protonix, but he ran out of the medicine and has not gotten a refill.  Patient states that while on this medicine, his pain was controlled.  He denies fevers, chills, cough, shortness of breath, nausea, vomiting, lower abdominal pain, urinary symptoms, normal bowel movements.  Additional history obtained from chart review.  Patient has been seen multiple times for gastritis and epigastric abdominal pain.  EGD in 11-20 20 showed severe esophagitis thought to be due to reflux.  EGD 6 months prior showed the same thing, and in between patient was not compliant with PPIs.  Additional history obtained from chart review.  Patient with a history of hypertension, prostate cancer, diabetes, stable aortic aneurysm, alcohol abuse, opioid use.  HPI     Past Medical History:  Diagnosis Date  . Arthritis    "fingers" (07/14/2018)  . Depression   . DVT (deep venous thrombosis) (HCC) LLE  . Hepatitis C     finished [facility] tx ~ 2017  . Hypercholesterolemia   . Hypertension   . Prostate cancer ([place]) 66yrs ago  . Pulmonary embolism and infarction ([place]) 07/13/2018  . Sleep apnea    not currently using cpap, mask causing vertigo  . Type II diabetes mellitus [facility])     Patient Active Problem List   Diagnosis Date Noted  . 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 AMS (altered mental status) 04/03/2019  . Acute kidney failure ([place])  01/09/2019  . Pulmonary embolus and infarction ([place]) 07/13/2018  . Opioid use disorder, moderate, dependence ([place]) 06/29/2018  . Alcohol dependence ([place]) 06/27/2018  . MDD (major depressive disorder), recurrent severe, without psychosis ([place]) 06/26/2018  . Varicose veins of left lower extremity with complications 123456  . Benign paroxysmal positional vertigo 11/08/2013  . RBBB 06/02/2009  . 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 DM (diabetes mellitus), type 2 ([place]) 04/11/2009  . OBESITY 04/11/2009  . DEPRESSION 04/11/2009  . Essential hypertension 04/11/2009  . DIVERTICULAR DISEASE 04/11/2009  . CHOLECYSTITIS 04/11/2009  . CHEST PAIN 04/11/2009  . ABDOMINAL PAIN 04/11/2009    Past Surgical History:  Procedure Laterality Date  . BIOPSY  01/12/2019   Procedure: BIOPSY;  Surgeon: Ronald Lobo, MD;  Location: [facility] ENDOSCOPY;  Service: Endoscopy;;  . BIOPSY  06/23/2019   Procedure: BIOPSY;  Surgeon: Arta Silence, MD;  Location: [facility];  Service: Endoscopy;;  . COLONOSCOPY WITH PROPOFOL N/A 02/19/2014   Procedure: COLONOSCOPY WITH PROPOFOL;  Surgeon: Garlan Fair, MD;  Location: [facility] ENDOSCOPY;  Service: Endoscopy;  Laterality: N/A;  . ENDOVENOUS ABLATION SAPHENOUS VEIN W/ LASER Left 11/22/2017   endovenous laser ablation L SSV by Tinnie Gens MD   . ESOPHAGEAL BRUSHING  06/23/2019   Procedure: ESOPHAGEAL BRUSHING;  Surgeon: Arta Silence, MD;  Location: [facility];  Service: Endoscopy;;  . ESOPHAGOGASTRODUODENOSCOPY (EGD) WITH PROPOFOL N/A 01/12/2019   Procedure: ESOPHAGOGASTRODUODENOSCOPY (  EGD) WITH PROPOFOL;  Surgeon: Ronald Lobo, MD;  Location: [facility];  Service: Endoscopy;  Laterality: N/A;  Patient is also scheduled for barium swallow; please notify radiology after patient's EGD is complete so that barium swallow follows the endoscopy, not vice versa  . ESOPHAGOGASTRODUODENOSCOPY (EGD) WITH PROPOFOL N/A 06/23/2019   Procedure: ESOPHAGOGASTRODUODENOSCOPY (EGD)  WITH PROPOFOL;  Surgeon: Arta Silence, MD;  Location: [facility];  Service: Endoscopy;  Laterality: N/A;  . PROSTATECTOMY  2008  . REPAIR QUADRICEPS / HAMSTRING MUSCLE Right        Family History  Problem Relation Age of Onset  . Cancer Father        PROSTATE    Social History   Tobacco Use  . Smoking status: Current Every Day Smoker    Packs/day: 0.12    Years: 45.00    Pack years: 5.40  . Smokeless tobacco: Never Used  Substance Use Topics  . Alcohol use: Yes    Comment: 07/14/2018 "40oz beer/day"  . Drug use: Not Currently    Comment: in recovery     Home Medications Prior to Admission medications   Medication Sig Start Date End Date Taking? Authorizing Provider  amLODipine (NORVASC) 10 MG tablet Take 1 tablet (10 mg total) by mouth daily. 06/30/19 07/30/19  [place] Eric J, DO  metoprolol tartrate (LOPRESSOR) 25 MG tablet Take 1 tablet (25 mg total) by mouth 2 (two) times daily. 06/30/19 07/30/19  [place] Eric J, DO  pantoprazole (PROTONIX) 40 MG tablet Take 1 tablet (40 mg total) by mouth 2 (two) times daily. 06/30/19 08/29/19  [place] Donnamarie Poag, DO  sertraline (ZOLOFT) 25 MG tablet Take 1 tablet (25 mg total) by mouth daily. 06/30/19   [place] Eric J, DO  sucralfate (CARAFATE) 1 GM/10ML suspension Take 10 mLs (1 g total) by mouth 4 (four) times daily -  with meals and at bedtime. 06/30/19 07/30/19  [place] Eric J, DO    Allergies    Patient has no known allergies.  Review of Systems   Review of Systems  Gastrointestinal: Positive for abdominal pain (upper abd).  All other systems reviewed and are negative.   Physical Exam Updated Vital Signs BP 136/86   Pulse 73   Temp 98.2 F (36.8 C) (Oral)   Resp 20   SpO2 97%   Physical Exam Vitals and nursing note reviewed.  Constitutional:      General: He is not in acute distress.    Appearance: He is well-developed.     Comments: Appears uncomfortable due to pain, otherwise nontoxic  HENT:     Head:  Normocephalic and atraumatic.     Mouth/Throat:     Mouth: Mucous membranes are moist.     Comments: MM moist Eyes:     Conjunctiva/sclera: Conjunctivae normal.     Pupils: Pupils are equal, round, and reactive to light.  Cardiovascular:     Rate and Rhythm: Normal rate and regular rhythm.     Pulses: Normal pulses.  Pulmonary:     Effort: Pulmonary effort is normal. No respiratory distress.     Breath sounds: Normal breath sounds. No wheezing.     Comments: Speaking in full sentences.  Clear lung sounds in all fields. Abdominal:     General: There is no distension.     Palpations: Abdomen is soft. There is no mass.     Tenderness: There is abdominal tenderness. There is no guarding or rebound.     Comments: Tenderness to palpation of epigastric  abdomen.  No rigidity, guarding, distention.  Negative rebound.  No peritonitis.  Musculoskeletal:        General: Normal range of motion.     Cervical back: Normal range of motion and neck supple.  Skin:    General: Skin is warm and dry.     Capillary Refill: Capillary refill takes less than 2 seconds.  Neurological:     Mental Status: He is alert and oriented to person, place, and time.     ED Results / Procedures / Treatments   Labs (all labs ordered are listed, but only abnormal results are displayed) Labs Reviewed  BASIC METABOLIC PANEL - Abnormal; Notable for the following components:      Result Value   Sodium 131 (*)    CO2 18 (*)    Glucose, Bld 108 (*)    Creatinine, Ser 1.51 (*)    GFR calc non Af Amer 48 (*)    GFR calc Af Amer 56 (*)    All other components within normal limits  TROPONIN I (HIGH SENSITIVITY) - Abnormal; Notable for the following components:   Troponin I (High Sensitivity) 23 (*)    All other components within normal limits  CBC  ETHANOL  HEPATIC FUNCTION PANEL  LIPASE, BLOOD  TROPONIN I (HIGH SENSITIVITY)    EKG EKG Interpretation  Date/Time:  Wednesday September 26 2019 14:13:23  EST Ventricular Rate:  79 PR Interval:    QRS Duration: 136 QT Interval:  434 QTC Calculation: 498 R Axis:   -86 Text Interpretation: Sinus rhythm RBBB and LAFB No significant change since last tracing Confirmed by Isla Pence [phone]) on 09/26/2019 2:21:47 PM   Radiology DG Chest 2 View  Result Date: 09/26/2019 CLINICAL DATA:  Mid chest pain for 3 days. EXAM: CHEST - 2 VIEW COMPARISON:  Radiograph 06/18/2019, CT 09/08/2018 FINDINGS: The cardiomediastinal contours are normal. The lungs are clear. Pulmonary vasculature is normal. No consolidation, pleural effusion, or pneumothorax. No acute osseous abnormalities are seen. Remote left rib fractures again seen. Degenerative change in the midthoracic spine. IMPRESSION: No acute chest findings. Electronically Signed   By: Keith Rake M.D.   On: 09/26/2019 13:17    Procedures Procedures (including critical care time)  Medications Ordered in ED Medications  sucralfate (CARAFATE) 1 GM/10ML suspension 1 g (has no administration in time range)  sodium chloride flush (NS) 0.9 % injection 3 mL (3 mLs Intravenous Given 09/26/19 1430)  pantoprazole (PROTONIX) injection 40 mg (40 mg Intravenous Given 09/26/19 1429)  sodium chloride 0.9 % bolus 1,000 mL (1,000 mLs Intravenous New Bag/Given 09/26/19 1432)  alum & mag hydroxide-simeth (MAALOX/MYLANTA) 200-200-20 MG/5ML suspension 30 mL (30 mLs Oral Given 09/26/19 1525)    And  lidocaine (XYLOCAINE) 2 % viscous mouth solution 15 mL (15 mLs Oral Given 09/26/19 1525)    ED Course  I have reviewed the triage vital signs and the nursing notes.  Pertinent labs & imaging results that were available during my care of the patient were reviewed by me and considered in my medical decision making (see chart for details).    MDM Rules/Calculators/A&P                      Patient presenting for evaluation of epigastric/chest pain.  History of severe esophagitis, has not been compliant with PPI.  Likely  cause of symptoms.  Patient continues to drink alcohol.  Consider pancreatitis.  Abdominal pain is only epigastric, no lower abdominal tenderness.  Doubt  cough.  No melena or hematochezia, doubt GI bleed.  He is not coughing or vomiting blood.  Will obtain labs and treat symptomatically.  Chest x-ray obtained from triage, viewed interpreted by me, no pneumonia, no thorax and effusion, cardiomegaly.  Troponin obtained from triage mildly elevated at 23.  This has been elevated in the past.  Likely pain/demand.  Doubt ACS.  EKG unchanged from previous, no new signs of ischemia.  On reassessment after Protonix, patient reports pain is still severe.  On reassessment after GI cocktail patient reports no improvement.  Labs overall reassuring.  No leukocytosis.  Creatinine close to patient's baseline.  Electrolytes stable. LFTs, ethanol, and lipase pending.   Pt signed out to Chauncey Cruel, PA-C for f/u on remaining labs and reassessment of sxs. If labs are reassuring and pt is able to tolerate PO, consider d/c with GI f/u.   Final Clinical Impression(s) / ED Diagnoses Final diagnoses:  None    Rx / DC Orders ED Discharge Orders    None       Franchot Heidelberg, PA-C 09/26/19 1612    Isla Pence, MD 09/26/19 [phone]

## 2019-09-26 NOTE — ED Triage Notes (Signed)
Pt here from home with c/o 5 days without eating or drinking due to pain in his throat , pt had a scope done several months back , pt states that pain radiates down to his chest

## 2019-09-26 NOTE — ED Notes (Signed)
Pt Provided "ED HAPPY MEAL"

## 2019-09-26 NOTE — ED Provider Notes (Signed)
  Physical Exam  BP 112/68   Pulse 77   Temp 98.2 F (36.8 C) (Oral)   Resp (!) 23   SpO2 97%   Physical Exam  ED Course/Procedures     Procedures  MDM  Patient care assumed from Phia C. PA at shift change, please see her note for a full HPI. Briefly, patient with chronic history of gastritis, EGD on 11 of 2020 showed severe esophagitis due to reflux.  6 months prior EGD showed the same, patient is noncompliant with PPIs.  Here in the ED today after nothing to eat or drink for the past 5 days although continues to drink alcohol daily.  Plan is for pending the rest of his hepatic function along with lipase and reassessment.  He will also need to tolerate p.o. prior to discharge.  Hepatic function without any elevation of his LFTs.  Lipase is normal, no signs of pancreatitis.  Patient was given Haldol 5 mg, he has eaten applesauce in the room, reports he could not eat the [place] sandwich this is was to "rough ". While entering room patient's facial expression changes, he reports "I am not going home I have coverage ".  Splane to patient that his lab work is unremarkable, he has had normal EGDs and this is just his recurrent esophagitis, he will go home with a prescription for Protonix. Patient discharged in stable condition.   BP 112/68   Pulse 77   Temp 98.2 F (36.8 C) (Oral)   Resp (!) 23   SpO2 97%      Portions of this note were generated with [employer]. Dictation errors may occur despite best attempts at proofreading.         Janeece Fitting, PA-C 09/26/19 1927    Fredia Sorrow, MD 09/27/19 1827

## 2019-09-26 NOTE — Discharge Instructions (Addendum)
It is very important that you take Protonix as prescribed. Use Carafate to help with pain control. Make sure you are staying well-hydrated water. Follow-up with your stomach doctor listed below for further evaluation and management of your pain. Return to emergency room if you develop high fevers, persistent vomiting, severe worsening pain, or any new, worsening, or concerning symptoms.

## 2019-11-16 IMAGING — CT CT ANGIO CHEST
2 of 6 series · 18 of 36 positions shown · IV contrast (iopamidol)
Comparison: 06/07/2018

CLINICAL DATA: Chest pain and shortness of breath. Positive
D-dimer.

EXAM:
CT ANGIOGRAPHY CHEST WITH CONTRAST
TECHNIQUE: Multidetector CT imaging of the chest was performed using the
standard protocol during bolus administration of intravenous
contrast. Multiplanar CT image reconstructions and MIPs were
obtained to evaluate the vascular anatomy.
CONTRAST:  100mL [id number] IOPAMIDOL ([id number]) INJECTION 76%

[Series 6: pe thins · axial · 0.79mm/px · z∈[-404,-186]mm · 17 of 347 slices shown]
[im 18/347  lung]
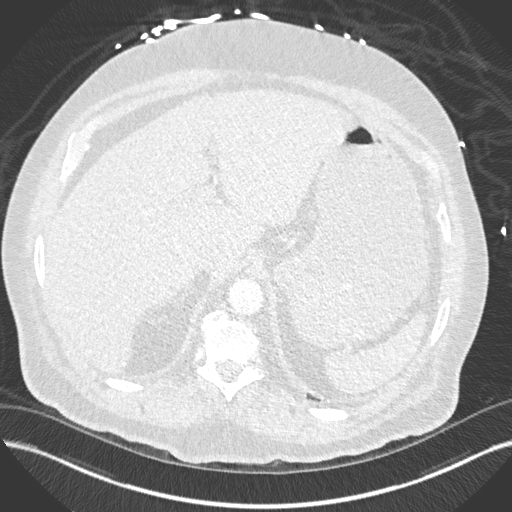
[im 35/347  mediastinal]
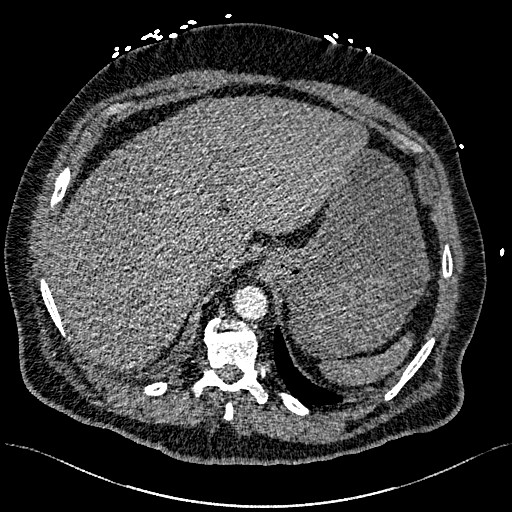
[im 52/347  lung]
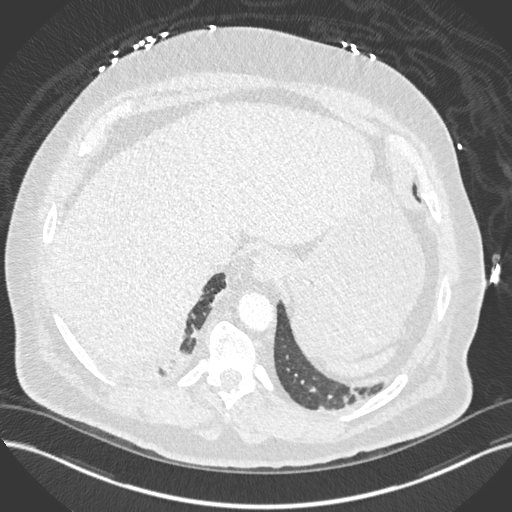
[im 70/347  mediastinal]
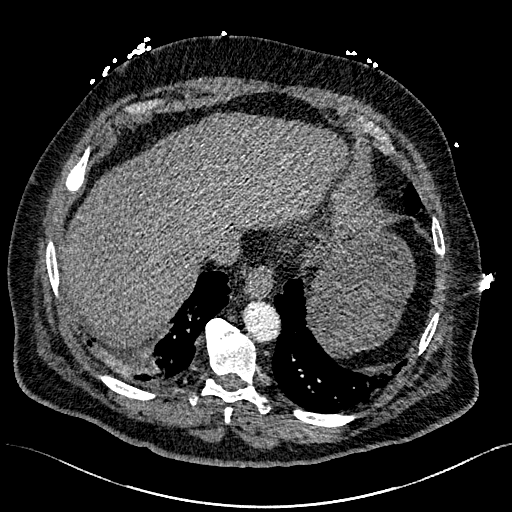
[im 104/347  lung]
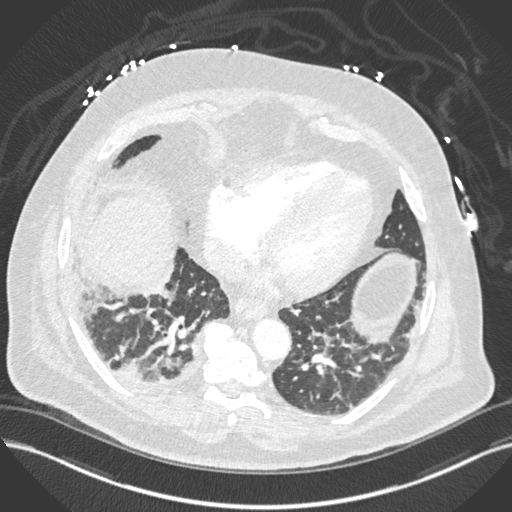
[im 122/347  mediastinal]
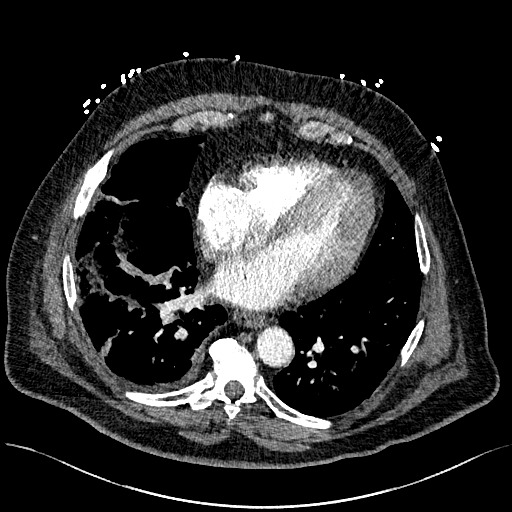
[im 139/347  lung]
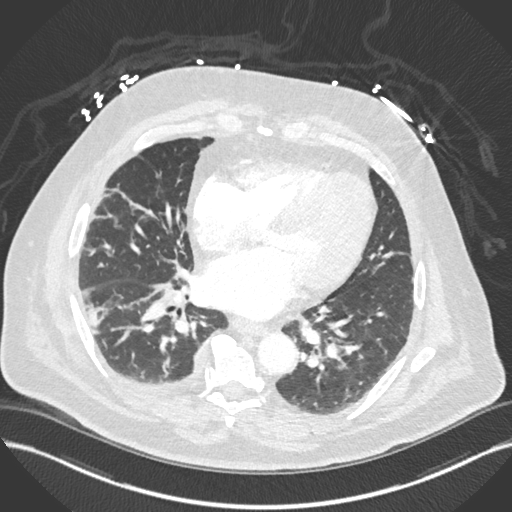
[im 156/347  mediastinal]
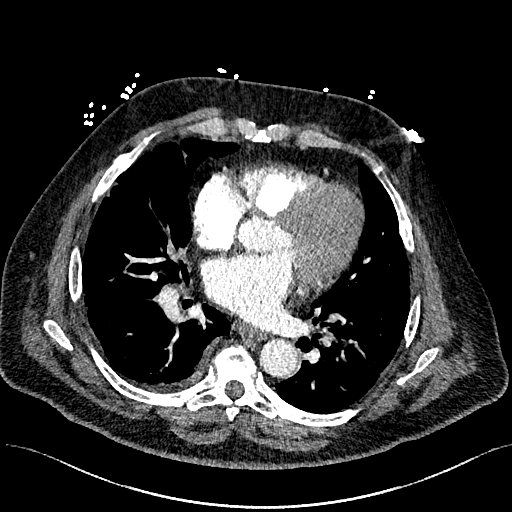
[im 174/347  lung]
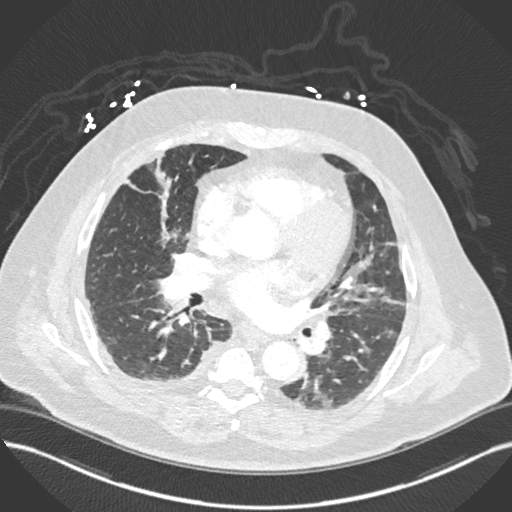
[im 191/347  mediastinal]
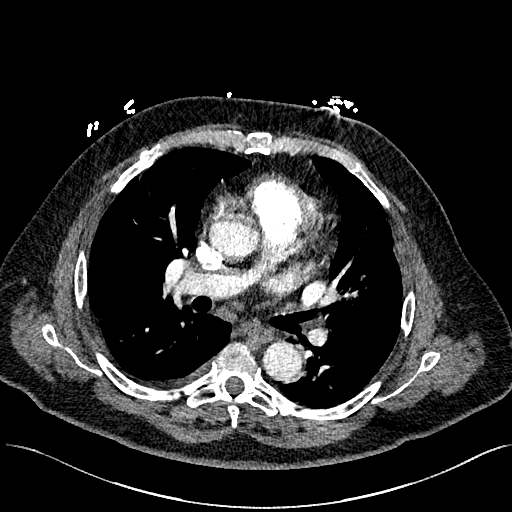
[im 208/347  lung]
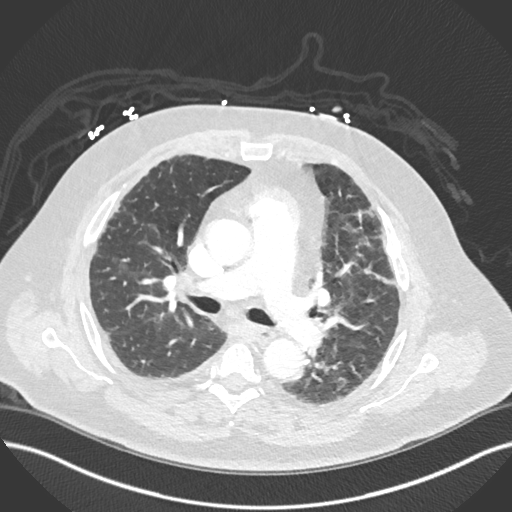
[im 225/347  mediastinal]
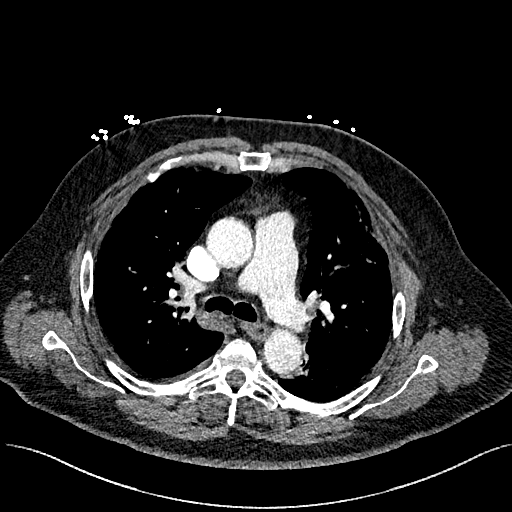
[im 243/347  lung]
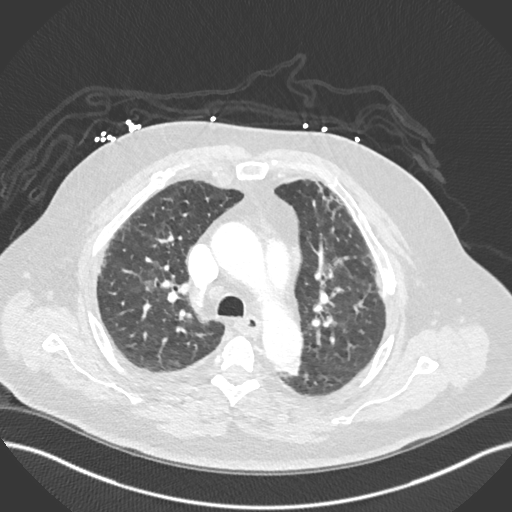
[im 277/347  mediastinal]
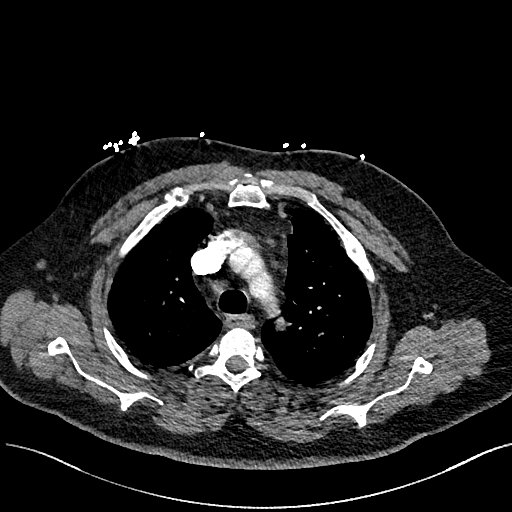
[im 295/347  lung]
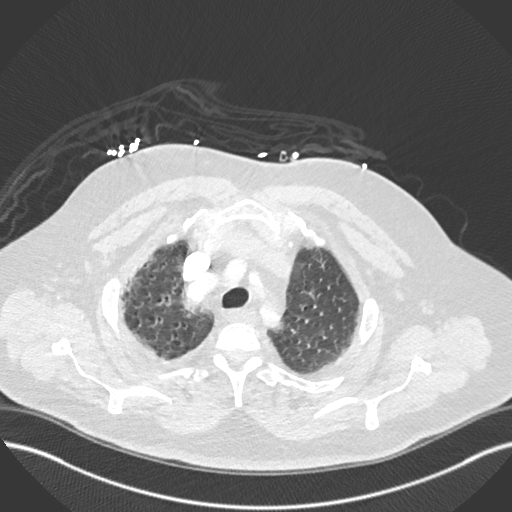
[im 312/347  mediastinal]
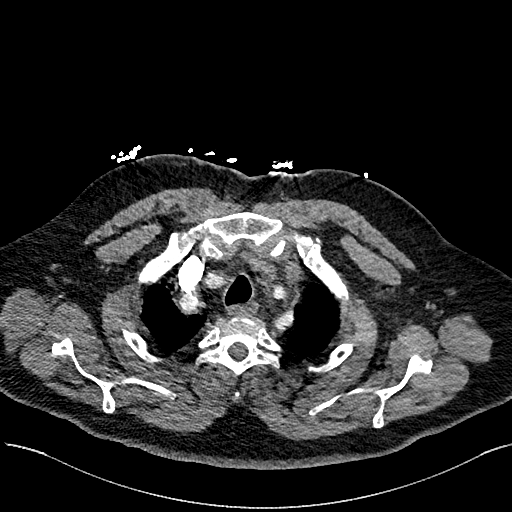
[im 329/347  lung]
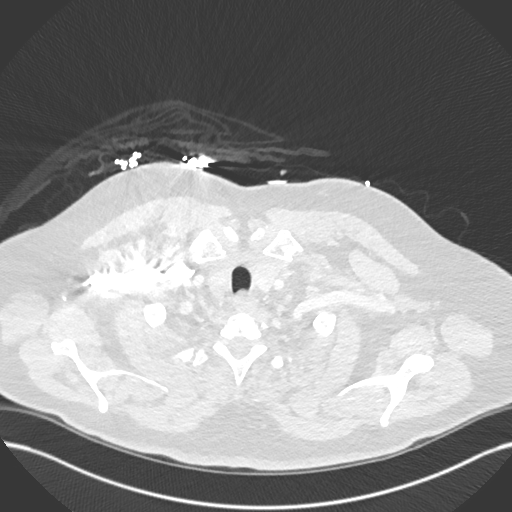

[Series 8: pe 2mm cor · coronal · 0.49mm/px · 1 of 151 slices shown]
[im 76/151  mediastinal]
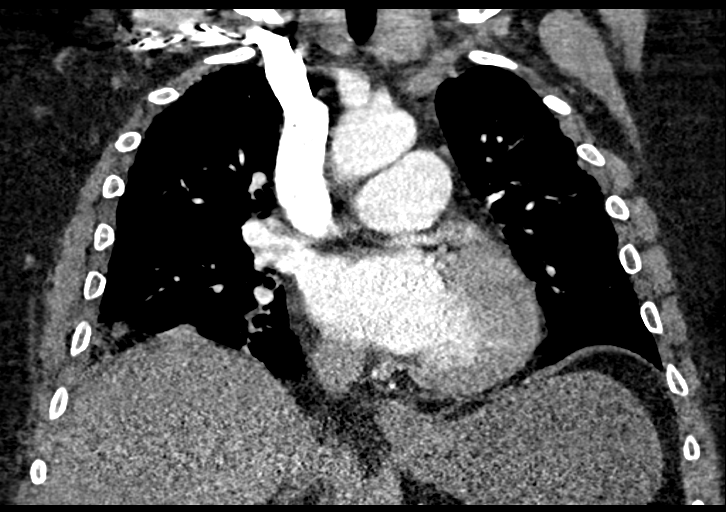

[18 of 36 positions shown; findings below may reference images not displayed]

FINDINGS: Cardiovascular: Pulmonary emboli are seen in several segmental and
subsegmental branches of the right upper, middle, and lower lobe
pulmonary arteries. No lobar or main pulmonary artery emboli
identified (no radiographic signs of right heart strain). No
evidence of thoracic aortic aneurysm or dissection.

Mediastinum/Nodes: No masses or pathologically enlarged lymph nodes
identified.

Lungs/Pleura: Pulmonary airspace disease is seen in the right lower
lobe, suspicious for pulmonary infarct. Tiny right pleural effusion
also noted.

Upper abdomen: No acute findings.

Musculoskeletal: No suspicious bone lesions identified. Several old
bilateral rib fracture deformities are noted.

Review of the MIP images confirms the above findings.
IMPRESSION: Multiple pulmonary emboli in right upper, middle, and lower lobe
segmental and subsegmental branches.

Right lower lobe airspace disease, suspicious for pulmonary infarct.
Tiny right pleural effusion.

Critical Value/emergent results were called by telephone at the time
of interpretation on 07/13/2018 at [DATE] to Dr. SHIMBODE DOEDENS ,
who verbally acknowledged these results.

## 2020-05-06 ENCOUNTER — Other Ambulatory Visit: Payer: Self-pay | Admitting: Surgery

## 2020-05-06 DIAGNOSIS — I712 Thoracic aortic aneurysm, without rupture, unspecified: Secondary | ICD-10-CM

## 2020-06-11 ENCOUNTER — Ambulatory Visit: Payer: Federal, State, Local not specified - PPO | Admitting: Surgery

## 2020-06-11 ENCOUNTER — Inpatient Hospital Stay: Admission: RE | Admit: 2020-06-11 | Payer: Federal, State, Local not specified - PPO | Source: Ambulatory Visit

## 2020-06-16 ENCOUNTER — Other Ambulatory Visit: Payer: Self-pay

## 2020-06-16 ENCOUNTER — Emergency Department (HOSPITAL_COMMUNITY): Payer: Medicare HMO

## 2020-06-16 ENCOUNTER — Encounter (HOSPITAL_COMMUNITY): Payer: Self-pay

## 2020-06-16 ENCOUNTER — Emergency Department (HOSPITAL_COMMUNITY)
Admission: EM | Admit: 2020-06-16 | Discharge: 2020-06-16 | Disposition: A | Discharge status: Home / Self Care | Payer: Medicare HMO | Attending: Emergency Medicine | Admitting: Emergency Medicine

## 2020-06-16 DIAGNOSIS — R69 Illness, unspecified: Secondary | ICD-10-CM | POA: Diagnosis not present

## 2020-06-16 DIAGNOSIS — M7732 Calcaneal spur, left foot: Secondary | ICD-10-CM | POA: Diagnosis not present

## 2020-06-16 DIAGNOSIS — E119 Type 2 diabetes mellitus without complications: Secondary | ICD-10-CM | POA: Diagnosis not present

## 2020-06-16 DIAGNOSIS — Z79899 Other long term (current) drug therapy: Secondary | ICD-10-CM | POA: Insufficient documentation

## 2020-06-16 DIAGNOSIS — F17210 Nicotine dependence, cigarettes, uncomplicated: Secondary | ICD-10-CM | POA: Insufficient documentation

## 2020-06-16 DIAGNOSIS — R5381 Other malaise: Secondary | ICD-10-CM | POA: Diagnosis not present

## 2020-06-16 DIAGNOSIS — R609 Edema, unspecified: Secondary | ICD-10-CM | POA: Diagnosis not present

## 2020-06-16 DIAGNOSIS — M1712 Unilateral primary osteoarthritis, left knee: Secondary | ICD-10-CM | POA: Insufficient documentation

## 2020-06-16 DIAGNOSIS — I10 Essential (primary) hypertension: Secondary | ICD-10-CM | POA: Insufficient documentation

## 2020-06-16 DIAGNOSIS — M25462 Effusion, left knee: Secondary | ICD-10-CM | POA: Diagnosis not present

## 2020-06-16 DIAGNOSIS — M25562 Pain in left knee: Secondary | ICD-10-CM | POA: Diagnosis present

## 2020-06-16 DIAGNOSIS — R52 Pain, unspecified: Secondary | ICD-10-CM | POA: Diagnosis not present

## 2020-06-16 LAB — CBG MONITORING, ED: Glucose-Capillary: 112 mg/dL — ABNORMAL HIGH (ref 70–99)

## 2020-06-16 MED ORDER — PREDNISONE 50 MG PO TABS: 50.0000 mg | ORAL_TABLET | Freq: Every day | ORAL | 0 refills | Status: DC

## 2020-06-16 MED ORDER — HYDROCODONE-ACETAMINOPHEN 5-325 MG PO TABS
1.0000 | ORAL_TABLET | Freq: Once | ORAL | Status: AC
  Administered 2020-06-16: 1 via ORAL
  Filled 2020-06-16: qty 1

## 2020-06-16 MED ORDER — ETODOLAC 300 MG PO CAPS: 300.0000 mg | ORAL_CAPSULE | Freq: Three times a day (TID) | ORAL | 0 refills | Status: DC

## 2020-06-16 NOTE — ED Triage Notes (Signed)
Per EMS- Patient c/o left knee pain and swellingx 1 month ,but worsened in the past week. Patient also c/o left big toe pain. Patient denies any injury.

## 2020-06-16 NOTE — Progress Notes (Signed)
TOC CM/CSW contacted pt in reference to transportation home.  Pt has left hospital and is on his way safely home.  CSW signing off.  Chiara Coltrin Tarpley-Carter, MSW, LCSW-A Pronouns:  She, Her, Hers                  [name] ED Transitions of CareClinical Social Worker [email or web address] [phone]

## 2020-06-16 NOTE — Discharge Instructions (Addendum)
Take the medication as prescribed for your pain.  Follow-up with an orthopedic doctor for further treatment.

## 2020-06-16 NOTE — ED Provider Notes (Signed)
[facility] DEPT Provider Note   CSN: [id number] Arrival date & time: 06/16/20  2542     History Chief Complaint  Patient presents with  . Knee Pain  . Toe Pain    Todd [name]. is a 64 y.o. male.  HPI   Patient presented to the emergency room for evaluation of left knee pain.  Patient states that started about a month ago in the last week it is increased in severity.  He denies any recent falls.  No injuries.  He denies any fevers or chills.  It hurts for him to walk.  The pain is sharp and increases with any movement or palpation  Past Medical History:  Diagnosis Date  . Arthritis    "fingers" (07/14/2018)  . Depression   . DVT (deep venous thrombosis) (HCC) LLE  . Hepatitis C     finished harvoni tx ~ 2017  . Hypercholesterolemia   . Hypertension   . Prostate cancer ([place]) 26yrs ago  . Pulmonary embolism and infarction ([place]) 07/13/2018  . Sleep apnea    not currently using cpap, mask causing vertigo  . Type II diabetes mellitus [facility])     Patient Active Problem List   Diagnosis Date Noted  . 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 AMS (altered mental status) 04/03/2019  . Acute kidney failure ([place]) 01/09/2019  . Pulmonary embolus and infarction ([place]) 07/13/2018  . Opioid use disorder, moderate, dependence ([place]) 06/29/2018  . Alcohol dependence ([place]) 06/27/2018  . MDD (major depressive disorder), recurrent severe, without psychosis ([place]) 06/26/2018  . Varicose veins of left lower extremity with complications 70/62/3762  . Benign paroxysmal positional vertigo 11/08/2013  . RBBB 06/02/2009  . 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 DM (diabetes mellitus), type 2 ([place]) 04/11/2009  . OBESITY 04/11/2009  . DEPRESSION 04/11/2009  . Essential hypertension 04/11/2009  . DIVERTICULAR DISEASE 04/11/2009  . CHOLECYSTITIS 04/11/2009  . CHEST PAIN 04/11/2009  . ABDOMINAL PAIN 04/11/2009    Past Surgical History:  Procedure Laterality Date   . BIOPSY  01/12/2019   Procedure: BIOPSY;  Surgeon: Ronald Lobo, MD;  Location: [facility] ENDOSCOPY;  Service: Endoscopy;;  . BIOPSY  06/23/2019   Procedure: BIOPSY;  Surgeon: Arta Silence, MD;  Location: [place];  Service: Endoscopy;;  . COLONOSCOPY WITH PROPOFOL N/A 02/19/2014   Procedure: COLONOSCOPY WITH PROPOFOL;  Surgeon: Garlan Fair, MD;  Location: [facility] ENDOSCOPY;  Service: Endoscopy;  Laterality: N/A;  . ENDOVENOUS ABLATION SAPHENOUS VEIN W/ LASER Left 11/22/2017   endovenous laser ablation L SSV by Tinnie Gens MD   . ESOPHAGEAL BRUSHING  06/23/2019   Procedure: ESOPHAGEAL BRUSHING;  Surgeon: Arta Silence, MD;  Location: [facility] [place];  Service: Endoscopy;;  . ESOPHAGOGASTRODUODENOSCOPY (EGD) WITH PROPOFOL N/A 01/12/2019   Procedure: ESOPHAGOGASTRODUODENOSCOPY (EGD) WITH PROPOFOL;  Surgeon: Ronald Lobo, MD;  Location: [facility];  Service: Endoscopy;  Laterality: N/A;  Patient is also scheduled for barium swallow; please notify radiology after patient's EGD is complete so that barium swallow follows the endoscopy, not vice versa  . ESOPHAGOGASTRODUODENOSCOPY (EGD) WITH PROPOFOL N/A 06/23/2019   Procedure: ESOPHAGOGASTRODUODENOSCOPY (EGD) WITH PROPOFOL;  Surgeon: Arta Silence, MD;  Location: [facility];  Service: Endoscopy;  Laterality: N/A;  . PROSTATECTOMY  2008  . REPAIR QUADRICEPS / HAMSTRING MUSCLE Right        Family History  Problem Relation Age of Onset  . Cancer Father        PROSTATE    Social History   Tobacco Use  .  Smoking status: Current Every Day Smoker    Packs/day: 0.12    Years: 45.00    Pack years: 5.40    Types: Cigarettes  . Smokeless tobacco: Never Used  Vaping Use  . Vaping Use: Never used  Substance Use Topics  . Alcohol use: Yes  . Drug use: Not Currently    Home Medications Prior to Admission medications   Medication Sig Start Date End Date Taking? Authorizing Provider  amLODipine (NORVASC) 10 MG tablet Take 1 tablet (10  mg total) by mouth daily. 06/30/19 07/30/19  [place] Donnamarie Poag, DO  etodolac (LODINE) 300 MG capsule Take 1 capsule (300 mg total) by mouth every 8 (eight) hours. 06/16/20   Dorie Rank, MD  metoprolol tartrate (LOPRESSOR) 25 MG tablet Take 1 tablet (25 mg total) by mouth 2 (two) times daily. 06/30/19 07/30/19  [place] Eric J, DO  pantoprazole (PROTONIX) 40 MG tablet Take 1 tablet (40 mg total) by mouth 2 (two) times daily. 09/26/19 10/26/19  Caccavale, Sophia, PA-C  predniSONE (DELTASONE) 50 MG tablet Take 1 tablet (50 mg total) by mouth daily. 06/16/20   Dorie Rank, MD  sertraline (ZOLOFT) 25 MG tablet Take 1 tablet (25 mg total) by mouth daily. 06/30/19   [place] Eric J, DO  sucralfate (CARAFATE) 1 GM/10ML suspension Take 10 mLs (1 g total) by mouth 4 (four) times daily -  with meals and at bedtime. 09/26/19 10/26/19  Caccavale, Sophia, PA-C    Allergies    Patient has no known allergies.  Review of Systems   Review of Systems  All other systems reviewed and are negative.   Physical Exam Updated Vital Signs BP (!) 167/91 (BP Location: Right Arm)   Pulse 67   Temp 99 F (37.2 C) (Oral)   Resp 18   Ht 1.753 m (5\' 9" )   Wt 104.3 kg   SpO2 98%   BMI 33.97 kg/m   Physical Exam Vitals and nursing note reviewed.  Constitutional:      General: He is not in acute distress.    Appearance: He is well-developed.  HENT:     Head: Normocephalic and atraumatic.     Right Ear: External ear normal.     Left Ear: External ear normal.  Eyes:     General: No scleral icterus.       Right eye: No discharge.        Left eye: No discharge.     Conjunctiva/sclera: Conjunctivae normal.  Neck:     Trachea: No tracheal deviation.  Cardiovascular:     Rate and Rhythm: Normal rate.  Pulmonary:     Effort: Pulmonary effort is normal. No respiratory distress.     Breath sounds: No stridor.  Abdominal:     General: There is no distension.  Musculoskeletal:        General: Tenderness present. No  swelling or deformity.     Cervical back: Neck supple.     Comments: Tenderness palpation left knee, pain with range of motion, no erythema or effusion left foot without erythema or edema, no focal areas of tenderness  Skin:    General: Skin is warm and dry.     Findings: No rash.  Neurological:     Mental Status: He is alert.     Cranial Nerves: Cranial nerve deficit: no gross deficits.     ED Results / Procedures / Treatments   Labs (all labs ordered are listed, but only abnormal results are displayed) Labs Reviewed  CBG MONITORING, ED - Abnormal; Notable for the following components:      Result Value   Glucose-Capillary 112 (*)    All other components within normal limits    EKG None  Radiology DG Knee Complete 4 Views Left  Result Date: 06/16/2020 CLINICAL DATA:  Left knee pain for 1 month. EXAM: LEFT KNEE - COMPLETE 4+ VIEW COMPARISON:  January 18, 2014. FINDINGS: No definite fracture or dislocation is noted. Small suprapatellar joint effusion is noted. Moderate degenerative changes seen involving the lateral joint space. Mild degenerative joint disease with osteophyte formation is seen involving the lateral joint space. Patellar spurring is noted. IMPRESSION: Mild to moderate degenerative joint disease. Small suprapatellar joint effusion. No fracture or dislocation is noted. Electronically Signed   By: Marijo Conception M.D.   On: 06/16/2020 10:58   DG Foot Complete Left  Result Date: 06/16/2020 CLINICAL DATA:  Left great toe pain for 1 month. EXAM: LEFT FOOT - COMPLETE 3+ VIEW COMPARISON:  February 23, 2016. FINDINGS: There is no evidence of acute fracture or dislocation. There is no evidence of arthropathy. Mild posterior calcaneal spurring is noted. Probable old distal fifth metatarsal fracture is noted. Soft tissues are unremarkable. IMPRESSION: No acute abnormality seen in the left foot. Electronically Signed   By: Marijo Conception M.D.   On: 06/16/2020 11:00     Procedures Procedures (including critical care time)  Medications Ordered in ED Medications  HYDROcodone-acetaminophen (NORCO/VICODIN) 5-325 MG per tablet 1 tablet (1 tablet Oral Given 06/16/20 1126)    ED Course  I have reviewed the triage vital signs and the nursing notes.  Pertinent labs & imaging results that were available during my care of the patient were reviewed by me and considered in my medical decision making (see chart for details).    MDM Rules/Calculators/A&P                          X-rays show evidence of osteoarthritis.  No findings to suggest infection.  Patient denies any history of gout although that would be a consideration as well.  Will discharge home with prescription for NSAIDs, pain medication and outpatient follow-up with orthopedics.  Patient was given a knee sleeve as well as crutches. Final Clinical Impression(s) / ED Diagnoses Final diagnoses:  Primary osteoarthritis of left knee    Rx / DC Orders ED Discharge Orders         Ordered    etodolac (LODINE) 300 MG capsule  Every 8 hours       Note to Pharmacy: As needed for pain   06/16/20 1208    predniSONE (DELTASONE) 50 MG tablet  Daily        06/16/20 1208           Dorie Rank, MD 06/16/20 1208

## 2020-07-22 DIAGNOSIS — R030 Elevated blood-pressure reading, without diagnosis of hypertension: Secondary | ICD-10-CM | POA: Diagnosis not present

## 2020-07-22 DIAGNOSIS — Z7984 Long term (current) use of oral hypoglycemic drugs: Secondary | ICD-10-CM | POA: Diagnosis not present

## 2020-07-22 DIAGNOSIS — G8929 Other chronic pain: Secondary | ICD-10-CM | POA: Diagnosis not present

## 2020-07-22 DIAGNOSIS — R69 Illness, unspecified: Secondary | ICD-10-CM | POA: Diagnosis not present

## 2020-07-22 DIAGNOSIS — Z720 Tobacco use: Secondary | ICD-10-CM | POA: Diagnosis not present

## 2020-07-22 DIAGNOSIS — Z7409 Other reduced mobility: Secondary | ICD-10-CM | POA: Diagnosis not present

## 2020-07-22 DIAGNOSIS — E119 Type 2 diabetes mellitus without complications: Secondary | ICD-10-CM | POA: Diagnosis not present

## 2020-08-01 DIAGNOSIS — I452 Bifascicular block: Secondary | ICD-10-CM | POA: Diagnosis not present

## 2020-08-01 DIAGNOSIS — S0990XA Unspecified injury of head, initial encounter: Secondary | ICD-10-CM | POA: Diagnosis not present

## 2020-08-01 DIAGNOSIS — R55 Syncope and collapse: Secondary | ICD-10-CM | POA: Diagnosis not present

## 2020-08-01 DIAGNOSIS — Z20822 Contact with and (suspected) exposure to covid-19: Secondary | ICD-10-CM | POA: Diagnosis not present

## 2020-08-01 DIAGNOSIS — G8929 Other chronic pain: Secondary | ICD-10-CM | POA: Diagnosis not present

## 2020-08-01 DIAGNOSIS — M25562 Pain in left knee: Secondary | ICD-10-CM | POA: Diagnosis not present

## 2020-08-01 DIAGNOSIS — R000 Tachycardia, unspecified: Secondary | ICD-10-CM | POA: Diagnosis not present

## 2020-08-01 DIAGNOSIS — N183 Chronic kidney disease, stage 3 unspecified: Secondary | ICD-10-CM | POA: Diagnosis not present

## 2020-08-01 DIAGNOSIS — R0902 Hypoxemia: Secondary | ICD-10-CM | POA: Diagnosis not present

## 2020-08-01 DIAGNOSIS — T796XXA Traumatic ischemia of muscle, initial encounter: Secondary | ICD-10-CM | POA: Diagnosis not present

## 2020-08-01 DIAGNOSIS — R404 Transient alteration of awareness: Secondary | ICD-10-CM | POA: Diagnosis not present

## 2020-08-01 DIAGNOSIS — I129 Hypertensive chronic kidney disease with stage 1 through stage 4 chronic kidney disease, or unspecified chronic kidney disease: Secondary | ICD-10-CM | POA: Diagnosis not present

## 2020-08-01 DIAGNOSIS — W19XXXA Unspecified fall, initial encounter: Secondary | ICD-10-CM | POA: Diagnosis not present

## 2020-08-01 DIAGNOSIS — R7989 Other specified abnormal findings of blood chemistry: Secondary | ICD-10-CM | POA: Diagnosis not present

## 2020-08-01 DIAGNOSIS — R410 Disorientation, unspecified: Secondary | ICD-10-CM | POA: Diagnosis not present

## 2020-08-01 DIAGNOSIS — N179 Acute kidney failure, unspecified: Secondary | ICD-10-CM | POA: Diagnosis not present

## 2020-08-01 DIAGNOSIS — R778 Other specified abnormalities of plasma proteins: Secondary | ICD-10-CM | POA: Diagnosis not present

## 2020-08-01 DIAGNOSIS — R059 Cough, unspecified: Secondary | ICD-10-CM | POA: Diagnosis not present

## 2020-08-01 DIAGNOSIS — R69 Illness, unspecified: Secondary | ICD-10-CM | POA: Diagnosis not present

## 2020-08-01 DIAGNOSIS — E871 Hypo-osmolality and hyponatremia: Secondary | ICD-10-CM | POA: Diagnosis not present

## 2020-08-01 DIAGNOSIS — I6782 Cerebral ischemia: Secondary | ICD-10-CM | POA: Diagnosis not present

## 2020-08-02 DIAGNOSIS — R55 Syncope and collapse: Secondary | ICD-10-CM | POA: Diagnosis not present

## 2020-08-02 DIAGNOSIS — E871 Hypo-osmolality and hyponatremia: Secondary | ICD-10-CM | POA: Diagnosis not present

## 2020-08-02 DIAGNOSIS — N179 Acute kidney failure, unspecified: Secondary | ICD-10-CM | POA: Diagnosis not present

## 2020-08-02 DIAGNOSIS — N183 Chronic kidney disease, stage 3 unspecified: Secondary | ICD-10-CM | POA: Diagnosis not present

## 2020-08-02 DIAGNOSIS — R7989 Other specified abnormal findings of blood chemistry: Secondary | ICD-10-CM | POA: Diagnosis not present

## 2020-09-03 ENCOUNTER — Emergency Department (HOSPITAL_COMMUNITY): Payer: Medicare HMO

## 2020-09-03 ENCOUNTER — Inpatient Hospital Stay (HOSPITAL_COMMUNITY): Payer: Medicare HMO

## 2020-09-03 ENCOUNTER — Inpatient Hospital Stay (HOSPITAL_COMMUNITY)
Admission: EM | Admit: 2020-09-03 | Discharge: 2020-09-29 | DRG: 208 | Disposition: A | Discharge status: Skilled Nursing Facility | Payer: Medicare HMO | Attending: Internal Medicine | Admitting: Internal Medicine

## 2020-09-03 ENCOUNTER — Encounter (HOSPITAL_COMMUNITY): Payer: Self-pay

## 2020-09-03 DIAGNOSIS — R69 Illness, unspecified: Secondary | ICD-10-CM | POA: Diagnosis not present

## 2020-09-03 DIAGNOSIS — N179 Acute kidney failure, unspecified: Secondary | ICD-10-CM | POA: Diagnosis present

## 2020-09-03 DIAGNOSIS — U071 COVID-19: Principal | ICD-10-CM | POA: Diagnosis present

## 2020-09-03 DIAGNOSIS — J1282 Pneumonia due to coronavirus disease 2019: Secondary | ICD-10-CM | POA: Diagnosis present

## 2020-09-03 DIAGNOSIS — E872 Acidosis: Secondary | ICD-10-CM | POA: Diagnosis not present

## 2020-09-03 DIAGNOSIS — R197 Diarrhea, unspecified: Secondary | ICD-10-CM | POA: Diagnosis present

## 2020-09-03 DIAGNOSIS — J690 Pneumonitis due to inhalation of food and vomit: Secondary | ICD-10-CM | POA: Diagnosis not present

## 2020-09-03 DIAGNOSIS — M25562 Pain in left knee: Secondary | ICD-10-CM | POA: Diagnosis not present

## 2020-09-03 DIAGNOSIS — R0600 Dyspnea, unspecified: Secondary | ICD-10-CM

## 2020-09-03 DIAGNOSIS — Z809 Family history of malignant neoplasm, unspecified: Secondary | ICD-10-CM

## 2020-09-03 DIAGNOSIS — Z86718 Personal history of other venous thrombosis and embolism: Secondary | ICD-10-CM | POA: Diagnosis not present

## 2020-09-03 DIAGNOSIS — G9340 Encephalopathy, unspecified: Secondary | ICD-10-CM | POA: Diagnosis not present

## 2020-09-03 DIAGNOSIS — Z978 Presence of other specified devices: Secondary | ICD-10-CM | POA: Diagnosis not present

## 2020-09-03 DIAGNOSIS — F32A Depression, unspecified: Secondary | ICD-10-CM | POA: Diagnosis present

## 2020-09-03 DIAGNOSIS — R7401 Elevation of levels of liver transaminase levels: Secondary | ICD-10-CM

## 2020-09-03 DIAGNOSIS — N390 Urinary tract infection, site not specified: Secondary | ICD-10-CM | POA: Diagnosis not present

## 2020-09-03 DIAGNOSIS — I959 Hypotension, unspecified: Secondary | ICD-10-CM | POA: Diagnosis present

## 2020-09-03 DIAGNOSIS — R000 Tachycardia, unspecified: Secondary | ICD-10-CM | POA: Diagnosis not present

## 2020-09-03 DIAGNOSIS — I4510 Unspecified right bundle-branch block: Secondary | ICD-10-CM | POA: Diagnosis not present

## 2020-09-03 DIAGNOSIS — G40901 Epilepsy, unspecified, not intractable, with status epilepticus: Secondary | ICD-10-CM | POA: Diagnosis not present

## 2020-09-03 DIAGNOSIS — R4020 Unspecified coma: Secondary | ICD-10-CM | POA: Diagnosis not present

## 2020-09-03 DIAGNOSIS — I5030 Unspecified diastolic (congestive) heart failure: Secondary | ICD-10-CM | POA: Diagnosis present

## 2020-09-03 DIAGNOSIS — Z86711 Personal history of pulmonary embolism: Secondary | ICD-10-CM

## 2020-09-03 DIAGNOSIS — Z4682 Encounter for fitting and adjustment of non-vascular catheter: Secondary | ICD-10-CM | POA: Diagnosis not present

## 2020-09-03 DIAGNOSIS — R52 Pain, unspecified: Secondary | ICD-10-CM

## 2020-09-03 DIAGNOSIS — M19012 Primary osteoarthritis, left shoulder: Secondary | ICD-10-CM | POA: Diagnosis not present

## 2020-09-03 DIAGNOSIS — D509 Iron deficiency anemia, unspecified: Secondary | ICD-10-CM | POA: Diagnosis present

## 2020-09-03 DIAGNOSIS — R509 Fever, unspecified: Secondary | ICD-10-CM

## 2020-09-03 DIAGNOSIS — I130 Hypertensive heart and chronic kidney disease with heart failure and stage 1 through stage 4 chronic kidney disease, or unspecified chronic kidney disease: Secondary | ICD-10-CM | POA: Diagnosis present

## 2020-09-03 DIAGNOSIS — J9601 Acute respiratory failure with hypoxia: Secondary | ICD-10-CM | POA: Diagnosis present

## 2020-09-03 DIAGNOSIS — E7800 Pure hypercholesterolemia, unspecified: Secondary | ICD-10-CM | POA: Diagnosis present

## 2020-09-03 DIAGNOSIS — J9602 Acute respiratory failure with hypercapnia: Secondary | ICD-10-CM | POA: Diagnosis present

## 2020-09-03 DIAGNOSIS — M109 Gout, unspecified: Secondary | ICD-10-CM | POA: Diagnosis present

## 2020-09-03 DIAGNOSIS — R4189 Other symptoms and signs involving cognitive functions and awareness: Secondary | ICD-10-CM

## 2020-09-03 DIAGNOSIS — G8929 Other chronic pain: Secondary | ICD-10-CM | POA: Diagnosis present

## 2020-09-03 DIAGNOSIS — Z7952 Long term (current) use of systemic steroids: Secondary | ICD-10-CM

## 2020-09-03 DIAGNOSIS — K209 Esophagitis, unspecified without bleeding: Secondary | ICD-10-CM | POA: Diagnosis not present

## 2020-09-03 DIAGNOSIS — E1165 Type 2 diabetes mellitus with hyperglycemia: Secondary | ICD-10-CM | POA: Diagnosis present

## 2020-09-03 DIAGNOSIS — F1411 Cocaine abuse, in remission: Secondary | ICD-10-CM

## 2020-09-03 DIAGNOSIS — G9341 Metabolic encephalopathy: Secondary | ICD-10-CM | POA: Diagnosis not present

## 2020-09-03 DIAGNOSIS — N1831 Chronic kidney disease, stage 3a: Secondary | ICD-10-CM | POA: Diagnosis present

## 2020-09-03 DIAGNOSIS — M7989 Other specified soft tissue disorders: Secondary | ICD-10-CM | POA: Diagnosis not present

## 2020-09-03 DIAGNOSIS — R748 Abnormal levels of other serum enzymes: Secondary | ICD-10-CM | POA: Diagnosis not present

## 2020-09-03 DIAGNOSIS — D539 Nutritional anemia, unspecified: Secondary | ICD-10-CM | POA: Diagnosis present

## 2020-09-03 DIAGNOSIS — B1920 Unspecified viral hepatitis C without hepatic coma: Secondary | ICD-10-CM | POA: Diagnosis present

## 2020-09-03 DIAGNOSIS — F1410 Cocaine abuse, uncomplicated: Secondary | ICD-10-CM | POA: Diagnosis present

## 2020-09-03 DIAGNOSIS — R569 Unspecified convulsions: Secondary | ICD-10-CM | POA: Diagnosis not present

## 2020-09-03 DIAGNOSIS — Z79899 Other long term (current) drug therapy: Secondary | ICD-10-CM

## 2020-09-03 DIAGNOSIS — R0689 Other abnormalities of breathing: Secondary | ICD-10-CM | POA: Diagnosis not present

## 2020-09-03 DIAGNOSIS — Z5901 Sheltered homelessness: Secondary | ICD-10-CM

## 2020-09-03 DIAGNOSIS — M6282 Rhabdomyolysis: Secondary | ICD-10-CM | POA: Diagnosis not present

## 2020-09-03 DIAGNOSIS — E875 Hyperkalemia: Secondary | ICD-10-CM | POA: Diagnosis present

## 2020-09-03 DIAGNOSIS — R739 Hyperglycemia, unspecified: Secondary | ICD-10-CM

## 2020-09-03 DIAGNOSIS — R1310 Dysphagia, unspecified: Secondary | ICD-10-CM | POA: Diagnosis not present

## 2020-09-03 DIAGNOSIS — R1013 Epigastric pain: Secondary | ICD-10-CM | POA: Diagnosis not present

## 2020-09-03 DIAGNOSIS — Z743 Need for continuous supervision: Secondary | ICD-10-CM | POA: Diagnosis not present

## 2020-09-03 DIAGNOSIS — A419 Sepsis, unspecified organism: Secondary | ICD-10-CM | POA: Diagnosis not present

## 2020-09-03 DIAGNOSIS — M1990 Unspecified osteoarthritis, unspecified site: Secondary | ICD-10-CM | POA: Diagnosis present

## 2020-09-03 DIAGNOSIS — I340 Nonrheumatic mitral (valve) insufficiency: Secondary | ICD-10-CM | POA: Diagnosis not present

## 2020-09-03 DIAGNOSIS — R945 Abnormal results of liver function studies: Secondary | ICD-10-CM | POA: Diagnosis not present

## 2020-09-03 DIAGNOSIS — R778 Other specified abnormalities of plasma proteins: Secondary | ICD-10-CM | POA: Diagnosis not present

## 2020-09-03 DIAGNOSIS — R6520 Severe sepsis without septic shock: Secondary | ICD-10-CM

## 2020-09-03 DIAGNOSIS — R4182 Altered mental status, unspecified: Secondary | ICD-10-CM | POA: Diagnosis not present

## 2020-09-03 DIAGNOSIS — Z5900 Homelessness unspecified: Secondary | ICD-10-CM | POA: Diagnosis not present

## 2020-09-03 DIAGNOSIS — I361 Nonrheumatic tricuspid (valve) insufficiency: Secondary | ICD-10-CM | POA: Diagnosis not present

## 2020-09-03 DIAGNOSIS — I82512 Chronic embolism and thrombosis of left femoral vein: Secondary | ICD-10-CM | POA: Diagnosis not present

## 2020-09-03 DIAGNOSIS — J984 Other disorders of lung: Secondary | ICD-10-CM | POA: Diagnosis not present

## 2020-09-03 DIAGNOSIS — G4730 Sleep apnea, unspecified: Secondary | ICD-10-CM | POA: Diagnosis present

## 2020-09-03 DIAGNOSIS — F1110 Opioid abuse, uncomplicated: Secondary | ICD-10-CM | POA: Diagnosis present

## 2020-09-03 DIAGNOSIS — T50904A Poisoning by unspecified drugs, medicaments and biological substances, undetermined, initial encounter: Secondary | ICD-10-CM | POA: Diagnosis not present

## 2020-09-03 DIAGNOSIS — N481 Balanitis: Secondary | ICD-10-CM | POA: Diagnosis present

## 2020-09-03 DIAGNOSIS — F17210 Nicotine dependence, cigarettes, uncomplicated: Secondary | ICD-10-CM | POA: Diagnosis present

## 2020-09-03 DIAGNOSIS — R0602 Shortness of breath: Secondary | ICD-10-CM

## 2020-09-03 DIAGNOSIS — J189 Pneumonia, unspecified organism: Secondary | ICD-10-CM | POA: Diagnosis not present

## 2020-09-03 DIAGNOSIS — T17908D Unspecified foreign body in respiratory tract, part unspecified causing other injury, subsequent encounter: Secondary | ICD-10-CM | POA: Diagnosis not present

## 2020-09-03 DIAGNOSIS — Z8546 Personal history of malignant neoplasm of prostate: Secondary | ICD-10-CM

## 2020-09-03 DIAGNOSIS — J9811 Atelectasis: Secondary | ICD-10-CM | POA: Diagnosis not present

## 2020-09-03 DIAGNOSIS — R918 Other nonspecific abnormal finding of lung field: Secondary | ICD-10-CM | POA: Diagnosis not present

## 2020-09-03 DIAGNOSIS — M25462 Effusion, left knee: Secondary | ICD-10-CM | POA: Diagnosis present

## 2020-09-03 DIAGNOSIS — M1712 Unilateral primary osteoarthritis, left knee: Secondary | ICD-10-CM | POA: Diagnosis not present

## 2020-09-03 DIAGNOSIS — M19011 Primary osteoarthritis, right shoulder: Secondary | ICD-10-CM | POA: Diagnosis not present

## 2020-09-03 DIAGNOSIS — Z7982 Long term (current) use of aspirin: Secondary | ICD-10-CM

## 2020-09-03 DIAGNOSIS — R404 Transient alteration of awareness: Secondary | ICD-10-CM | POA: Diagnosis not present

## 2020-09-03 DIAGNOSIS — R55 Syncope and collapse: Secondary | ICD-10-CM | POA: Diagnosis not present

## 2020-09-03 DIAGNOSIS — Z87898 Personal history of other specified conditions: Secondary | ICD-10-CM

## 2020-09-03 DIAGNOSIS — E1122 Type 2 diabetes mellitus with diabetic chronic kidney disease: Secondary | ICD-10-CM | POA: Diagnosis present

## 2020-09-03 LAB — I-STAT ARTERIAL BLOOD GAS, ED
Acid-base deficit: 11 mmol/L — ABNORMAL HIGH (ref 0.0–2.0)
Bicarbonate: 17.9 mmol/L — ABNORMAL LOW (ref 20.0–28.0)
Calcium, Ion: 1.19 mmol/L (ref 1.15–1.40)
HCT: 39 % (ref 39.0–52.0)
Hemoglobin: 13.3 g/dL (ref 13.0–17.0)
O2 Saturation: 95 %
Patient temperature: 100.7
Potassium: 5.4 mmol/L — ABNORMAL HIGH (ref 3.5–5.1)
Sodium: 139 mmol/L (ref 135–145)
TCO2: 19 mmol/L — ABNORMAL LOW (ref 22–32)
pCO2 arterial: 54.3 mmHg — ABNORMAL HIGH (ref 32.0–48.0)
pH, Arterial: 7.133 — CL (ref 7.350–7.450)
pO2, Arterial: 108 mmHg (ref 83.0–108.0)

## 2020-09-03 LAB — URINALYSIS, ROUTINE W REFLEX MICROSCOPIC
Bilirubin Urine: NEGATIVE
Glucose, UA: 50 mg/dL — AB
Ketones, ur: NEGATIVE mg/dL
Leukocytes,Ua: NEGATIVE
Nitrite: NEGATIVE
Protein, ur: 100 mg/dL — AB
Specific Gravity, Urine: 1.01 (ref 1.005–1.030)
pH: 6 (ref 5.0–8.0)

## 2020-09-03 LAB — COMPREHENSIVE METABOLIC PANEL
ALT: 147 U/L — ABNORMAL HIGH (ref 0–44)
AST: 502 U/L — ABNORMAL HIGH (ref 15–41)
Albumin: 3.6 g/dL (ref 3.5–5.0)
Alkaline Phosphatase: 97 U/L (ref 38–126)
Anion gap: 18 — ABNORMAL HIGH (ref 5–15)
BUN: 21 mg/dL (ref 8–23)
CO2: 13 mmol/L — ABNORMAL LOW (ref 22–32)
Calcium: 8.6 mg/dL — ABNORMAL LOW (ref 8.9–10.3)
Chloride: 108 mmol/L (ref 98–111)
Creatinine, Ser: 2.45 mg/dL — ABNORMAL HIGH (ref 0.61–1.24)
GFR, Estimated: 29 mL/min — ABNORMAL LOW (ref >60)
Glucose, Bld: 73 mg/dL (ref 70–99)
Potassium: 5.7 mmol/L — ABNORMAL HIGH (ref 3.5–5.1)
Sodium: 139 mmol/L (ref 135–145)
Total Bilirubin: 0.8 mg/dL (ref 0.3–1.2)
Total Protein: 7 g/dL (ref 6.5–8.1)

## 2020-09-03 LAB — HEMOGLOBIN A1C
Hgb A1c MFr Bld: 5.5 % (ref 4.8–5.6)
Mean Plasma Glucose: 111.15 mg/dL

## 2020-09-03 LAB — CK: Total CK: 367 U/L (ref 49–397)

## 2020-09-03 LAB — CBC
HCT: 38.5 % — ABNORMAL LOW (ref 39.0–52.0)
Hemoglobin: 11.5 g/dL — ABNORMAL LOW (ref 13.0–17.0)
MCH: 31.2 pg (ref 26.0–34.0)
MCHC: 29.9 g/dL — ABNORMAL LOW (ref 30.0–36.0)
MCV: 104.3 fL — ABNORMAL HIGH (ref 80.0–100.0)
Platelets: 163 10*3/uL (ref 150–400)
RBC: 3.69 MIL/uL — ABNORMAL LOW (ref 4.22–5.81)
RDW: 15.9 % — ABNORMAL HIGH (ref 11.5–15.5)
WBC: 6.7 10*3/uL (ref 4.0–10.5)
nRBC: 0 % (ref 0.0–0.2)

## 2020-09-03 LAB — CSF CELL COUNT WITH DIFFERENTIAL
RBC Count, CSF: 1 /mm3 — ABNORMAL HIGH
RBC Count, CSF: 2 /mm3 — ABNORMAL HIGH
Tube #: 1
Tube #: 4
WBC, CSF: 1 /mm3 (ref 0–5)
WBC, CSF: 1 /mm3 (ref 0–5)

## 2020-09-03 LAB — CBG MONITORING, ED
Glucose-Capillary: 70 mg/dL (ref 70–99)
Glucose-Capillary: 93 mg/dL (ref 70–99)

## 2020-09-03 LAB — PROCALCITONIN: Procalcitonin: 3.6 ng/mL

## 2020-09-03 LAB — CBC WITH DIFFERENTIAL/PLATELET
Abs Immature Granulocytes: 0.1 10*3/uL — ABNORMAL HIGH (ref 0.00–0.07)
Basophils Absolute: 0 10*3/uL (ref 0.0–0.1)
Basophils Relative: 0 %
Eosinophils Absolute: 0 10*3/uL (ref 0.0–0.5)
Eosinophils Relative: 0 %
HCT: 41.8 % (ref 39.0–52.0)
Hemoglobin: 12.7 g/dL — ABNORMAL LOW (ref 13.0–17.0)
Immature Granulocytes: 1 %
Lymphocytes Relative: 13 %
Lymphs Abs: 1.1 10*3/uL (ref 0.7–4.0)
MCH: 32 pg (ref 26.0–34.0)
MCHC: 30.4 g/dL (ref 30.0–36.0)
MCV: 105.3 fL — ABNORMAL HIGH (ref 80.0–100.0)
Monocytes Absolute: 0.6 10*3/uL (ref 0.1–1.0)
Monocytes Relative: 7 %
Neutro Abs: 6.4 10*3/uL (ref 1.7–7.7)
Neutrophils Relative %: 79 %
Platelets: 208 10*3/uL (ref 150–400)
RBC: 3.97 MIL/uL — ABNORMAL LOW (ref 4.22–5.81)
RDW: 16 % — ABNORMAL HIGH (ref 11.5–15.5)
WBC: 8.2 10*3/uL (ref 4.0–10.5)
nRBC: 0 % (ref 0.0–0.2)

## 2020-09-03 LAB — MAGNESIUM: Magnesium: 1.6 mg/dL — ABNORMAL LOW (ref 1.7–2.4)

## 2020-09-03 LAB — GLUCOSE, CSF: Glucose, CSF: 69 mg/dL (ref 40–70)

## 2020-09-03 LAB — HIV ANTIBODY (ROUTINE TESTING W REFLEX): HIV Screen 4th Generation wRfx: NONREACTIVE

## 2020-09-03 LAB — PROTIME-INR
INR: 1.4 — ABNORMAL HIGH (ref 0.8–1.2)
Prothrombin Time: 16.5 seconds — ABNORMAL HIGH (ref 11.4–15.2)

## 2020-09-03 LAB — RAPID URINE DRUG SCREEN, HOSP PERFORMED
Amphetamines: NOT DETECTED
Barbiturates: NOT DETECTED
Benzodiazepines: NOT DETECTED
Cocaine: NOT DETECTED
Opiates: NOT DETECTED
Tetrahydrocannabinol: NOT DETECTED

## 2020-09-03 LAB — RESP PANEL BY RT-PCR (FLU A&B, COVID) ARPGX2
Influenza A by PCR: NEGATIVE
Influenza B by PCR: NEGATIVE
SARS Coronavirus 2 by RT PCR: POSITIVE — AB

## 2020-09-03 LAB — GLUCOSE, CAPILLARY
Glucose-Capillary: 109 mg/dL — ABNORMAL HIGH (ref 70–99)
Glucose-Capillary: 86 mg/dL (ref 70–99)

## 2020-09-03 LAB — PHOSPHORUS: Phosphorus: 6.3 mg/dL — ABNORMAL HIGH (ref 2.5–4.6)

## 2020-09-03 LAB — PROTEIN, CSF: Total  Protein, CSF: 42 mg/dL (ref 15–45)

## 2020-09-03 LAB — ACETAMINOPHEN LEVEL: Acetaminophen (Tylenol), Serum: <10 ug/mL — ABNORMAL LOW (ref 10–30)

## 2020-09-03 LAB — ETHANOL: Alcohol, Ethyl (B): <10 mg/dL (ref <10)

## 2020-09-03 LAB — SALICYLATE LEVEL: Salicylate Lvl: <7 mg/dL — ABNORMAL LOW (ref 7.0–30.0)

## 2020-09-03 LAB — LACTIC ACID, PLASMA: Lactic Acid, Venous: 3 mmol/L (ref 0.5–1.9)

## 2020-09-03 LAB — TROPONIN I (HIGH SENSITIVITY)
Troponin I (High Sensitivity): 396 ng/L (ref <18)
Troponin I (High Sensitivity): 650 ng/L (ref <18)

## 2020-09-03 MED ORDER — ALBUTEROL SULFATE (2.5 MG/3ML) 0.083% IN NEBU: 2.5000 mg | INHALATION_SOLUTION | RESPIRATORY_TRACT | Status: DC | PRN

## 2020-09-03 MED ORDER — ROCURONIUM BROMIDE 10 MG/ML (PF) SYRINGE
PREFILLED_SYRINGE | INTRAVENOUS | Status: AC
  Filled 2020-09-03: qty 10

## 2020-09-03 MED ORDER — VANCOMYCIN HCL 2000 MG/400ML IV SOLN
2000.0000 mg | Freq: Once | INTRAVENOUS | Status: AC
  Administered 2020-09-03: 2000 mg via INTRAVENOUS
  Filled 2020-09-03: qty 400

## 2020-09-03 MED ORDER — ACETAMINOPHEN 325 MG PO TABS
650.0000 mg | ORAL_TABLET | ORAL | Status: DC | PRN
  Administered 2020-09-04: 650 mg via ORAL
  Filled 2020-09-03: qty 2

## 2020-09-03 MED ORDER — MIDAZOLAM HCL 2 MG/2ML IJ SOLN: 2.0000 mg | INTRAMUSCULAR | Status: DC | PRN

## 2020-09-03 MED ORDER — ETOMIDATE 2 MG/ML IV SOLN
INTRAVENOUS | Status: AC
  Administered 2020-09-03: 30 mg via INTRAVENOUS
  Filled 2020-09-03: qty 20

## 2020-09-03 MED ORDER — SODIUM ZIRCONIUM CYCLOSILICATE 10 G PO PACK
10.0000 g | PACK | Freq: Two times a day (BID) | ORAL | Status: DC
  Administered 2020-09-03: 10 g
  Filled 2020-09-03: qty 1

## 2020-09-03 MED ORDER — PIPERACILLIN-TAZOBACTAM 3.375 G IVPB
3.3750 g | Freq: Three times a day (TID) | INTRAVENOUS | Status: DC
  Administered 2020-09-03 – 2020-09-04 (×2): 3.375 g via INTRAVENOUS
  Filled 2020-09-03 (×2): qty 50

## 2020-09-03 MED ORDER — INSULIN ASPART 100 UNIT/ML ~~LOC~~ SOLN
0.0000 [IU] | SUBCUTANEOUS | Status: DC
  Administered 2020-09-06 (×2): 1 [IU] via SUBCUTANEOUS
  Administered 2020-09-06: 2 [IU] via SUBCUTANEOUS

## 2020-09-03 MED ORDER — ONDANSETRON HCL 4 MG/2ML IJ SOLN: 4.0000 mg | Freq: Four times a day (QID) | INTRAMUSCULAR | Status: DC | PRN

## 2020-09-03 MED ORDER — POLYETHYLENE GLYCOL 3350 17 G PO PACK: 17.0000 g | PACK | Freq: Every day | ORAL | Status: DC | PRN

## 2020-09-03 MED ORDER — PROPOFOL 1000 MG/100ML IV EMUL
5.0000 ug/kg/min | INTRAVENOUS | Status: DC
  Administered 2020-09-03 – 2020-09-04 (×3): 15 ug/kg/min via INTRAVENOUS
  Filled 2020-09-03 (×3): qty 100

## 2020-09-03 MED ORDER — ETOMIDATE 2 MG/ML IV SOLN: 30.0000 mg | Freq: Once | INTRAVENOUS | Status: AC

## 2020-09-03 MED ORDER — ADULT MULTIVITAMIN W/MINERALS CH: 1.0000 | ORAL_TABLET | Freq: Every day | ORAL | Status: DC

## 2020-09-03 MED ORDER — FENTANYL CITRATE (PF) 100 MCG/2ML IJ SOLN: 50.0000 ug | INTRAMUSCULAR | Status: DC | PRN

## 2020-09-03 MED ORDER — PIPERACILLIN-TAZOBACTAM 3.375 G IVPB: 3.3750 g | Freq: Three times a day (TID) | INTRAVENOUS | Status: DC

## 2020-09-03 MED ORDER — LORAZEPAM 2 MG/ML IJ SOLN
INTRAMUSCULAR | Status: AC
  Administered 2020-09-03: 2 mg via INTRAVENOUS
  Filled 2020-09-03: qty 1

## 2020-09-03 MED ORDER — METRONIDAZOLE IN NACL 5-0.79 MG/ML-% IV SOLN: 500.0000 mg | Freq: Once | INTRAVENOUS | Status: DC

## 2020-09-03 MED ORDER — SODIUM BICARBONATE 8.4 % IV SOLN
50.0000 meq | Freq: Once | INTRAVENOUS | Status: AC
  Administered 2020-09-03: 50 meq via INTRAVENOUS
  Filled 2020-09-03: qty 50

## 2020-09-03 MED ORDER — LORAZEPAM 2 MG/ML IJ SOLN: 4.0000 mg | INTRAMUSCULAR | Status: DC | PRN

## 2020-09-03 MED ORDER — SUCCINYLCHOLINE CHLORIDE 200 MG/10ML IV SOSY
PREFILLED_SYRINGE | INTRAVENOUS | Status: AC
  Filled 2020-09-03: qty 10

## 2020-09-03 MED ORDER — VANCOMYCIN VARIABLE DOSE PER UNSTABLE RENAL FUNCTION (PHARMACIST DOSING): Status: DC

## 2020-09-03 MED ORDER — PROPOFOL 1000 MG/100ML IV EMUL
INTRAVENOUS | Status: AC
  Filled 2020-09-03: qty 100

## 2020-09-03 MED ORDER — PANTOPRAZOLE SODIUM 40 MG IV SOLR
40.0000 mg | Freq: Every day | INTRAVENOUS | Status: DC
  Administered 2020-09-03: 40 mg via INTRAVENOUS
  Filled 2020-09-03: qty 40

## 2020-09-03 MED ORDER — VANCOMYCIN HCL IN DEXTROSE 1-5 GM/200ML-% IV SOLN: 1000.0000 mg | Freq: Once | INTRAVENOUS | Status: DC

## 2020-09-03 MED ORDER — DOCUSATE SODIUM 100 MG PO CAPS: 100.0000 mg | ORAL_CAPSULE | Freq: Two times a day (BID) | ORAL | Status: DC | PRN

## 2020-09-03 MED ORDER — FENTANYL CITRATE (PF) 100 MCG/2ML IJ SOLN
50.0000 ug | INTRAMUSCULAR | Status: DC | PRN
  Administered 2020-09-03: 50 ug via INTRAVENOUS
  Filled 2020-09-03: qty 2

## 2020-09-03 MED ORDER — FOLIC ACID 1 MG PO TABS: 1.0000 mg | ORAL_TABLET | Freq: Every day | ORAL | Status: DC

## 2020-09-03 MED ORDER — LACTATED RINGERS IV SOLN: INTRAVENOUS | Status: DC

## 2020-09-03 MED ORDER — ACETAMINOPHEN 650 MG RE SUPP
650.0000 mg | Freq: Once | RECTAL | Status: AC
  Administered 2020-09-03: 650 mg via RECTAL
  Filled 2020-09-03: qty 1

## 2020-09-03 MED ORDER — ROCURONIUM BROMIDE 50 MG/5ML IV SOLN
100.0000 mg | Freq: Once | INTRAVENOUS | Status: AC
  Administered 2020-09-03: 100 mg via INTRAVENOUS
  Filled 2020-09-03: qty 10

## 2020-09-03 MED ORDER — SODIUM CHLORIDE 0.9 % IV SOLN: 2.0000 g | Freq: Once | INTRAVENOUS | Status: DC

## 2020-09-03 MED ORDER — LORAZEPAM 2 MG/ML IJ SOLN: 2.0000 mg | Freq: Once | INTRAMUSCULAR | Status: AC

## 2020-09-03 MED ORDER — THIAMINE HCL 100 MG PO TABS: 100.0000 mg | ORAL_TABLET | Freq: Every day | ORAL | Status: DC

## 2020-09-03 MED ORDER — LACTATED RINGERS IV BOLUS
1000.0000 mL | Freq: Once | INTRAVENOUS | Status: AC
  Administered 2020-09-03: 1000 mL via INTRAVENOUS

## 2020-09-03 MED ORDER — POLYETHYLENE GLYCOL 3350 17 G PO PACK
17.0000 g | PACK | Freq: Every day | ORAL | Status: DC
  Administered 2020-09-04 – 2020-09-07 (×3): 17 g
  Filled 2020-09-03 (×7): qty 1

## 2020-09-03 MED ORDER — DEXTROSE 5 % IV SOLN
10.0000 mg/kg | Freq: Two times a day (BID) | INTRAVENOUS | Status: DC
  Administered 2020-09-03 – 2020-09-04 (×2): 1000 mg via INTRAVENOUS
  Filled 2020-09-03 (×5): qty 20

## 2020-09-03 MED ORDER — SODIUM CHLORIDE 0.9 % IV BOLUS
1000.0000 mL | Freq: Once | INTRAVENOUS | Status: AC
  Administered 2020-09-03: 1000 mL via INTRAVENOUS

## 2020-09-03 MED ORDER — DOCUSATE SODIUM 50 MG/5ML PO LIQD
100.0000 mg | Freq: Two times a day (BID) | ORAL | Status: DC
  Administered 2020-09-03 – 2020-09-11 (×8): 100 mg
  Filled 2020-09-03 (×16): qty 10

## 2020-09-03 MED ORDER — HEPARIN SODIUM (PORCINE) 5000 UNIT/ML IJ SOLN
5000.0000 [IU] | Freq: Three times a day (TID) | INTRAMUSCULAR | Status: DC
  Administered 2020-09-03 – 2020-09-29 (×66): 5000 [IU] via SUBCUTANEOUS
  Filled 2020-09-03 (×65): qty 1

## 2020-09-03 NOTE — ED Notes (Signed)
Emergently intubating pt. Dr Regenia Skeeter, Hassan Rowan, [place] [facility] pharmacy, Charlett Nose EMT and this RN present.

## 2020-09-03 NOTE — ED Triage Notes (Signed)
Pt arrived via [facility] from [facility]. Pt was found unresponsive, apneic and foaming at the mouth. Pt LKW 1100 today. EMS gave narcan 2 mg intranasal. Per EMS pt's pupils were pinpoint. Per EMS after narcan pt had some return of mental status, but was never verbal. Pt arrived on [facility] and being bagged. Pt sinus tach on monitor. Pt unresponsive.

## 2020-09-03 NOTE — Progress Notes (Addendum)
Pharmacy Antibiotic Note  Todd [name]. is a 65 y.o. male admitted on 09/03/2020 with sepsis from unknown source and suspected status epilepticus. Pharmacy has been consulted for zosyn and vancomycin for sepsis, and acyclovir dosing for herpes encephalitis.   Scr 2.45, elevated from baseline ~1.5. WBC 8.2, Tmax 100.7. Lactic acid pending. CSF labs pending.  Plan: Vancomycin 2000 mg x1 -- Due to poor renal function, pharmacy will order intermittent vancomycin doses as necessary Zosyn 3.375g q8h Acyclovir 1000 mg q12h Monitor renal function, cultures, labs, clinical progression  Height: 5\' 6"  (167.6 cm) Weight: 100 kg (220 lb 7.4 oz) IBW/kg (Calculated) : 63.8  Temp (24hrs), Avg:100.7 F (38.2 C), Min:100.7 F (38.2 C), Max:100.7 F (38.2 C)  Recent Labs  Lab 09/03/20 1623  WBC 8.2  CREATININE 2.45*    Estimated Creatinine Clearance: 33.7 mL/min (A) (by C-G formula based on SCr of 2.45 mg/dL (H)).    No Known Allergies  Antimicrobials this admission: Zosyn 1/19 >>  Vancomycin 1/19 >>  Acyclovir 1/19 >>  Dose adjustments this admission:  Microbiology results: 1/19 BCx: sent 1/19 CSF culture: sent 1/19 UCx: sent  1/19 Tracheal Aspirate: sent  1/19 Resp PCR: COVID positive  Thank you for allowing pharmacy to be a part of this patient's care.  Fara Olden, PharmD PGY-1 Pharmacy Resident 09/03/2020 8:07 PM Please see AMION for all pharmacy numbers

## 2020-09-03 NOTE — Progress Notes (Signed)
STAT EEG complete - results pending. ? ?

## 2020-09-03 NOTE — Sepsis Progress Note (Signed)
eLink is monitoring this Code Sepsis. °

## 2020-09-03 NOTE — ED Provider Notes (Addendum)
[place] EMERGENCY DEPARTMENT Provider Note   CSN: [id number] Arrival date & time: 09/03/20  1614  LEVEL 5 CAVEAT - ALTERED MENTAL STATUS   History Chief Complaint  Patient presents with  . Drug Overdose    Todd [name]. is a 65 y.o. male.  HPI 65 year old male presents with altered mental status.  The patient is unresponsive and thus history is not readily available besides what EMS reports.  The patient was last seen normal around 10 AM.  They were called to the homeless shelter for unresponsiveness.  He was unresponsive and it is unclear if he was completely apneic or having trouble breathing.  He was given 2 mg intranasal Narcan and did seem to wake up some.  However at some point he then had a seizure lasting a minute and a half.  Since then he has not regained normal mental status.  When he woke up from the Narcan it seem like he looked around a little bit but never fully recovered.  He has a history of opiate abuse.  Otherwise history is quite limited.   Past Medical History:  Diagnosis Date  . Arthritis    "fingers" (07/14/2018)  . Depression   . DVT (deep venous thrombosis) (HCC) LLE  . Hepatitis C     finished harvoni tx ~ 2017  . Hypercholesterolemia   . Hypertension   . Prostate cancer ([place]) 35yrs ago  . Pulmonary embolism and infarction ([place]) 07/13/2018  . Sleep apnea    not currently using cpap, mask causing vertigo  . Type II diabetes mellitus [facility])     Patient Active Problem List   Diagnosis Date Noted  . 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 AMS (altered mental status) 04/03/2019  . Acute kidney failure ([place]) 01/09/2019  . Pulmonary embolus and infarction ([place]) 07/13/2018  . Opioid use disorder, moderate, dependence ([place]) 06/29/2018  . Alcohol dependence ([place]) 06/27/2018  . MDD (major depressive disorder), recurrent severe, without psychosis ([place]) 06/26/2018  . Varicose veins of left lower extremity with complications [id number]   . Benign paroxysmal positional vertigo 11/08/2013  . RBBB 06/02/2009  . 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 DM (diabetes mellitus), type 2 ([place]) 04/11/2009  . OBESITY 04/11/2009  . DEPRESSION 04/11/2009  . Essential hypertension 04/11/2009  . DIVERTICULAR DISEASE 04/11/2009  . CHOLECYSTITIS 04/11/2009  . CHEST PAIN 04/11/2009  . ABDOMINAL PAIN 04/11/2009    Past Surgical History:  Procedure Laterality Date  . BIOPSY  01/12/2019   Procedure: BIOPSY;  Surgeon: Ronald Lobo, MD;  Location: [facility] ENDOSCOPY;  Service: Endoscopy;;  . BIOPSY  06/23/2019   Procedure: BIOPSY;  Surgeon: Arta Silence, MD;  Location: [place];  Service: Endoscopy;;  . COLONOSCOPY WITH PROPOFOL N/A 02/19/2014   Procedure: COLONOSCOPY WITH PROPOFOL;  Surgeon: Garlan Fair, MD;  Location: [facility] ENDOSCOPY;  Service: Endoscopy;  Laterality: N/A;  . ENDOVENOUS ABLATION SAPHENOUS VEIN W/ LASER Left 11/22/2017   endovenous laser ablation L SSV by Tinnie Gens MD   . ESOPHAGEAL BRUSHING  06/23/2019   Procedure: ESOPHAGEAL BRUSHING;  Surgeon: Arta Silence, MD;  Location: [place];  Service: Endoscopy;;  . ESOPHAGOGASTRODUODENOSCOPY (EGD) WITH PROPOFOL N/A 01/12/2019   Procedure: ESOPHAGOGASTRODUODENOSCOPY (EGD) WITH PROPOFOL;  Surgeon: Ronald Lobo, MD;  Location: [place];  Service: Endoscopy;  Laterality: N/A;  Patient is also scheduled for barium swallow; please notify radiology after patient's EGD is complete so that barium swallow follows the endoscopy, not vice versa  . ESOPHAGOGASTRODUODENOSCOPY (EGD) WITH PROPOFOL N/A  06/23/2019   Procedure: ESOPHAGOGASTRODUODENOSCOPY (EGD) WITH PROPOFOL;  Surgeon: Arta Silence, MD;  Location: [facility] ENDOSCOPY;  Service: Endoscopy;  Laterality: N/A;  . PROSTATECTOMY  2008  . REPAIR QUADRICEPS / HAMSTRING MUSCLE Right        Family History  Problem Relation Age of Onset  . Cancer Father        PROSTATE    Social History   Tobacco Use  . Smoking status:  Current Every Day Smoker    Packs/day: 0.12    Years: 45.00    Pack years: 5.40    Types: Cigarettes  . Smokeless tobacco: Never Used  Vaping Use  . Vaping Use: Never used  Substance Use Topics  . Alcohol use: Yes  . Drug use: Not Currently    Home Medications Prior to Admission medications   Medication Sig Start Date End Date Taking? Authorizing Provider  amLODipine (NORVASC) 10 MG tablet Take 1 tablet (10 mg total) by mouth daily. 06/30/19 07/30/19  [place] Donnamarie Poag, DO  etodolac (LODINE) 300 MG capsule Take 1 capsule (300 mg total) by mouth every 8 (eight) hours. 06/16/20   Dorie Rank, MD  metoprolol tartrate (LOPRESSOR) 25 MG tablet Take 1 tablet (25 mg total) by mouth 2 (two) times daily. 06/30/19 07/30/19  [place] Eric J, DO  pantoprazole (PROTONIX) 40 MG tablet Take 1 tablet (40 mg total) by mouth 2 (two) times daily. 09/26/19 10/26/19  Caccavale, Sophia, PA-C  predniSONE (DELTASONE) 50 MG tablet Take 1 tablet (50 mg total) by mouth daily. 06/16/20   Dorie Rank, MD  sertraline (ZOLOFT) 25 MG tablet Take 1 tablet (25 mg total) by mouth daily. 06/30/19   [place] Eric J, DO  sucralfate (CARAFATE) 1 GM/10ML suspension Take 10 mLs (1 g total) by mouth 4 (four) times daily -  with meals and at bedtime. 09/26/19 10/26/19  Caccavale, Sophia, PA-C    Allergies    Patient has no known allergies.  Review of Systems   Review of Systems  Unable to perform ROS: Mental status change    Physical Exam Updated Vital Signs Ht 5\' 6"  (1.676 m)   Wt 105 kg   SpO2 98%   BMI 37.36 kg/m   Physical Exam Vitals and nursing note reviewed.  Constitutional:      Appearance: He is well-developed and well-nourished.  HENT:     Head: Normocephalic and atraumatic.     Right Ear: External ear normal.     Left Ear: External ear normal.     Nose: Nose normal.  Eyes:     General:        Right eye: No discharge.        Left eye: No discharge.     Pupils: Pupils are equal, round, and reactive to  light.     Comments: Initially eyes deviated to left, seems to normalize during initial exam  Cardiovascular:     Rate and Rhythm: Regular rhythm. Tachycardia present.     Heart sounds: Normal heart sounds.  Pulmonary:     Effort: Pulmonary effort is normal. Tachypnea present. Bradypneic: RR~25.     Comments: Coarse BS bilaterally Abdominal:     General: There is no distension.  Musculoskeletal:        General: No edema.     Cervical back: Neck supple.  Skin:    General: Skin is warm and dry.  Neurological:     Mental Status: He is unresponsive.     Comments: Patient does not respond  to pain or move extremities.  His left leg and arm seem a little stiff compared to the right side.  Psychiatric:        Mood and Affect: Mood is not anxious.     ED Results / Procedures / Treatments   Labs (all labs ordered are listed, but only abnormal results are displayed) Labs Reviewed  RESP PANEL BY RT-PCR (FLU A&B, COVID) ARPGX2 - Abnormal; Notable for the following components:      Result Value   SARS Coronavirus 2 by RT PCR POSITIVE (*)    All other components within normal limits  COMPREHENSIVE METABOLIC PANEL - Abnormal; Notable for the following components:   Potassium 5.7 (*)    CO2 13 (*)    Creatinine, Ser 2.45 (*)    Calcium 8.6 (*)    AST 502 (*)    ALT 147 (*)    GFR, Estimated 29 (*)    Anion gap 18 (*)    All other components within normal limits  ACETAMINOPHEN LEVEL - Abnormal; Notable for the following components:   Acetaminophen (Tylenol), Serum <10 (*)    All other components within normal limits  SALICYLATE LEVEL - Abnormal; Notable for the following components:   Salicylate Lvl [id number] (*)    All other components within normal limits  URINALYSIS, ROUTINE W REFLEX MICROSCOPIC - Abnormal; Notable for the following components:   APPearance CLOUDY (*)    Glucose, UA 50 (*)    Hgb urine dipstick MODERATE (*)    Protein, ur 100 (*)    Bacteria, UA RARE (*)    All other  components within normal limits  CBC WITH DIFFERENTIAL/PLATELET - Abnormal; Notable for the following components:   RBC 3.97 (*)    Hemoglobin 12.7 (*)    MCV 105.3 (*)    RDW 16.0 (*)    Abs Immature Granulocytes 0.10 (*)    All other components within normal limits  I-STAT ARTERIAL BLOOD GAS, ED - Abnormal; Notable for the following components:   pH, Arterial 7.133 (*)    pCO2 arterial 54.3 (*)    Bicarbonate 17.9 (*)    TCO2 19 (*)    Acid-base deficit 11.0 (*)    Potassium 5.4 (*)    All other components within normal limits  TROPONIN I (HIGH SENSITIVITY) - Abnormal; Notable for the following components:   Troponin I (High Sensitivity) 396 (*)    All other components within normal limits  CULTURE, BLOOD (ROUTINE X 2)  CULTURE, BLOOD (ROUTINE X 2)  URINE CULTURE  CULTURE, RESPIRATORY  CSF CULTURE  GRAM STAIN  ETHANOL  RAPID URINE DRUG SCREEN, HOSP PERFORMED  LACTIC ACID, PLASMA  LACTIC ACID, PLASMA  HIV ANTIBODY (ROUTINE TESTING W REFLEX)  CBC  MAGNESIUM  PHOSPHORUS  MAGNESIUM  PHOSPHORUS  PROCALCITONIN  BLOOD GAS, ARTERIAL  CK  HEMOGLOBIN A1C  HEPATITIS PANEL, ACUTE  CSF CELL COUNT WITH DIFFERENTIAL  CSF CELL COUNT WITH DIFFERENTIAL  GLUCOSE, CSF  PROTEIN, CSF  HERPES SIMPLEX VIRUS(HSV) DNA BY PCR  COMPREHENSIVE METABOLIC PANEL  CK  CBC  PROTIME-INR  BLOOD GAS, ARTERIAL  CBG MONITORING, ED  I-STAT CHEM 8, ED  TYPE AND SCREEN  TROPONIN I (HIGH SENSITIVITY)    EKG EKG Interpretation  Date/Time:  Wednesday September 03 2020 16:50:45 EST Ventricular Rate:  125 PR Interval:    QRS Duration: 137 QT Interval:  345 QTC Calculation: 498 R Axis:   -90 Text Interpretation: Sinus tachycardia Multiple ventricular premature complexes RBBB  and LAFB rate is faster compared to Feb 2021 Confirmed by Sherwood Gambler [phone]) on 09/03/2020 4:54:35 PM   Radiology CT Head Wo Contrast  Result Date: 09/03/2020 CLINICAL DATA:  Cerebral hemorrhage suspected, mental  status change, unknown cause. EXAM: CT HEAD WITHOUT CONTRAST TECHNIQUE: Contiguous axial images were obtained from the base of the skull through the vertex without intravenous contrast. COMPARISON:  Head CT 04/03/2019. FINDINGS: Brain: Mild cerebral atrophy. Subtle ill-defined hypoattenuation within the bilateral thalami (for instance as seen on series 3, image 15). Moderate ill-defined hypoattenuation within the cerebral white matter is nonspecific. There is no acute intracranial hemorrhage. No demarcated cortical infarct. No extra-axial fluid collection. No evidence of intracranial mass. No midline shift. Vascular: No hyperdense vessel.  Atherosclerotic calcifications Skull: Normal. Negative for fracture or focal lesion. Sinuses/Orbits: Visualized orbits show no acute finding. Chronic medially displaced fracture deformity of the left lamina papyracea. Mild bilateral ethmoid and sphenoid sinus mucosal thickening. Small left maxillary sinus mucous retention cyst. Secretions within the bilateral nasal passages. IMPRESSION: Subtle ill-defined hypoattenuation within the bilateral thalami. Findings may reflect progressive chronic small vessel ischemic disease. Subtle changes of hypoxic/ischemic injury cannot be excluded. Consider brain MRI for further evaluation, as clinically warranted. No acute intracranial hemorrhage or acute demarcated cortical infarction. Moderate cerebral white matter disease, nonspecific but most often secondary to chronic small vessel ischemia. Mild cerebral atrophy Paranasal sinus disease as described. Electronically Signed   By: Kellie Simmering DO   On: 09/03/2020 17:59   DG Chest Portable 1 View  Result Date: 09/03/2020 CLINICAL DATA:  Unresponsiveness EXAM: PORTABLE CHEST 1 VIEW COMPARISON:  08/01/2020 FINDINGS: Endotracheal tube terminates 1.8 cm above the carina. Enteric tube courses below the diaphragm with distal tip beyond the inferior margin of the film. Heart size is within normal  limits. Slight prominence of the superior mediastinal contours favored secondary to AP portable technique and slight rotation. No focal airspace consolidation, pleural effusion, or pneumothorax. Degenerative changes of the bilateral shoulders. No acute osseous abnormality. IMPRESSION: 1. Endotracheal tube terminates 1.8 cm above the carina. Consider retraction 1-2 cm for more optimal positioning. 2. No acute cardiopulmonary findings. Electronically Signed   By: Davina Poke D.O.   On: 09/03/2020 16:57    Procedures Procedure Name: Intubation Date/Time: 09/03/2020 4:43 PM Performed by: Sherwood Gambler, MD Pre-anesthesia Checklist: Patient identified, Patient being monitored, Emergency Drugs available, Timeout performed and Suction available Oxygen Delivery Method: Non-rebreather mask Preoxygenation: Pre-oxygenation with 100% oxygen Induction Type: Rapid sequence Ventilation: Mask ventilation without difficulty Laryngoscope Size: Glidescope and 3 Grade View: Grade II Tube size: 7.5 mm Number of attempts: 1 Airway Equipment and Method: Video-laryngoscopy Placement Confirmation: ETT inserted through vocal cords under direct vision,  CO2 detector and Breath sounds checked- equal and bilateral Secured at: 24 cm Tube secured with: ETT holder Dental Injury: Teeth and Oropharynx as per pre-operative assessment     .Lumbar Puncture  Date/Time: 09/03/2020 6:36 PM Performed by: Sherwood Gambler, MD Authorized by: Sherwood Gambler, MD   Consent:    Consent obtained:  Emergent situation Universal protocol:    Patient identity confirmed:  Anonymous protocol, patient vented/unresponsive Pre-procedure details:    Procedure purpose:  Diagnostic   Preparation: Patient was prepped and draped in usual sterile fashion   Anesthesia:    Anesthesia method:  Local infiltration   Local anesthetic:  Lidocaine 1% w/o epi Procedure details:    Lumbar space:  L3-L4 interspace   Patient position:  R  lateral decubitus   Needle gauge:  20   Ultrasound guidance: yes     Ultrasound guidance use: view anatomy of the area     Number of attempts:  1   Fluid appearance:  Clear   Tubes of fluid:  4   Total volume (ml):  4 Post-procedure details:    Puncture site:  Adhesive bandage applied   Procedure completion:  Tolerated well, no immediate complications .Critical Care Performed by: Sherwood Gambler, MD Authorized by: Sherwood Gambler, MD   Critical care provider statement:    Critical care time (minutes):  45   Critical care time was exclusive of:  Separately billable procedures and treating other patients   Critical care was necessary to treat or prevent imminent or life-threatening deterioration of the following conditions:  CNS failure or compromise and respiratory failure   Critical care was time spent personally by me on the following activities:  Discussions with consultants, evaluation of patient's response to treatment, examination of patient, ordering and performing treatments and interventions, ordering and review of laboratory studies, ordering and review of radiographic studies, pulse oximetry, re-evaluation of patient's condition, obtaining history from patient or surrogate and review of old charts Ultrasound ED Peripheral IV (Provider)  Date/Time: 09/03/2020 7:38 PM Performed by: Sherwood Gambler, MD Authorized by: Sherwood Gambler, MD   Procedure details:    Indications: poor IV access     Skin Prep: chlorhexidine gluconate     Location:  Right AC   Angiocath:  18 G   Bedside Ultrasound Guided: Yes     Images: not archived     Patient tolerated procedure without complications: Yes     Dressing applied: Yes     (including critical care time)  Angiocath insertion Performed by: Ephraim Hamburger  Consent: Verbal consent obtained. Risks and benefits: risks, benefits and alternatives were discussed Time out: Immediately prior to procedure a "time out" was called to verify  the correct patient, procedure, equipment, support staff and site/side marked as required.  Preparation: Patient was prepped and draped in the usual sterile fashion.  Vein Location: left EJ  Gauge: 20  Normal blood return and flush without difficulty Patient tolerance: Patient tolerated the procedure well with no immediate complications.    Medications Ordered in ED Medications  LORazepam (ATIVAN) 2 MG/ML injection (has no administration in time range)  rocuronium bromide 100 MG/10ML SOSY (has no administration in time range)  succinylcholine (ANECTINE) 200 MG/10ML syringe (has no administration in time range)  etomidate (AMIDATE) 2 MG/ML injection (has no administration in time range)  propofol (DIPRIVAN) 1000 MG/100ML infusion (has no administration in time range)  etomidate (AMIDATE) injection 30 mg (has no administration in time range)  rocuronium (ZEMURON) injection 100 mg (has no administration in time range)  propofol (DIPRIVAN) 1000 MG/100ML infusion (has no administration in time range)    ED Course  I have reviewed the triage vital signs and the nursing notes.  Pertinent labs & imaging results that were available during my care of the patient were reviewed by me and considered in my medical decision making (see chart for details).    MDM Rules/Calculators/A&P                          Patient was intubated for airway protection. He was tachypneic but had a lot of airway secretions and was not waking up.  There was question of whether he was still having seizures versus just being postictal given he had some stiff extremities  and originally his eyes were deviated to the left.  He was given Ativan but no significant change in this.  Thus he was intubated.  He was later found to have low-grade fever of 100.9 from his Foley.  He is found to have acute kidney injury.  Because of this elevated temperature in association with what apparently is a new onset seizure and altered  mental status, lumbar puncture was performed under emergent consent.  These results are still pending. Given broad antibiotics and acyclovir. He was found to have respiratory/metabolic acidosis and critical care recommended increasing respiratory rate to 25.  Admit to Critical Care. Found to be covid positive, which is probably what is driving his fever. I did discuss with neurology, and they have recommended stat EEG to rule out current seizures/status  Todd [name]. was evaluated in Emergency Department on 09/03/2020 for the symptoms described in the history of present illness. He was evaluated in the context of the global COVID-19 pandemic, which necessitated consideration that the patient might be at risk for infection with the SARS-CoV-2 virus that causes COVID-19. Institutional protocols and algorithms that pertain to the evaluation of patients at risk for COVID-19 are in a state of rapid change based on information released by regulatory bodies including the CDC and federal and state organizations. These policies and algorithms were followed during the patient's care in the ED.  Final Clinical Impression(s) / ED Diagnoses Final diagnoses:  Acute respiratory failure with hypercapnia ([place])  COVID-19    Rx / DC Orders ED Discharge Orders    None       Sherwood Gambler, MD 09/03/20 2004    Sherwood Gambler, MD 09/03/20 [place] Course    Sherwood Gambler, MD 09/03/20 2005

## 2020-09-03 NOTE — H&P (Addendum)
NAME:  Todd Quinter., MRN:  [id number], DOB:  1956-06-14, LOS: 0 ADMISSION DATE:  09/03/2020, CONSULTATION DATE:  09/03/20 REFERRING MD:  Regenia Skeeter, CHIEF COMPLAINT:  Found down   Brief History   65 year old w/ hx of DM, HTN, CKD, EtOH/tobacco/heroin abuse presenting from homeless shelter after being found down intubated in ER.  History of present illness   65 year old w/ hx of DM, HTN, CKD, EtOH/tobacco/heroin abuse presenting from homeless shelter after being found down foaming at mouth.  Intubated emergently in ER.  History per chart review as patient is intubated and sedated.  Seizure-like activity noted after intubation.  Ethanol/Salicylate/tylenol neg UDS pending  Past Medical History  HTN DM EtOH abuse Tobacco abuse Heroin abuse CKD  Significant Hospital Events   1/19 admitted  Consults:  Neurology  Procedures:  N/A  Significant Diagnostic Tests:  09/04/19 IMPRESSION: Subtle ill-defined hypoattenuation within the bilateral thalami. Findings may reflect progressive chronic small vessel ischemic disease. Subtle changes of hypoxic/ischemic injury cannot be excluded. Consider brain MRI for further evaluation, as clinically warranted. No acute intracranial hemorrhage or acute demarcated cortical infarction. Moderate cerebral white matter disease, nonspecific but most often secondary to chronic small vessel ischemia. Mild cerebral atrophy Paranasal sinus disease as described.    Micro Data:  COVID>>   Antimicrobials:  Vanc>> Zosyn>>   Interim history/subjective:  Consulted  Objective   Blood pressure 106/72, pulse (!) 118, temperature (!) 100.7 F (38.2 C), temperature source Core, resp. rate (!) 0, height 5\' 6"  (1.676 m), weight 100 kg, SpO2 97 %.    Vent Mode: PRVC FiO2 (%):  [100 %] 100 % Set Rate:  [15 bmp] 15 bmp Vt Set:  [510 mL] 510 mL PEEP:  [5 cmH20] 5 cmH20 Plateau Pressure:  [17 cmH20] 17 cmH20  No intake or output data in the 24  hours ending 09/03/20 1819 Filed Weights   09/03/20 1615 09/03/20 1649  Weight: 105 kg 100 kg    Examination: Constitutional: disheveled ill appearing man on vent  Eyes: pupils reactive, equal Ears, nose, mouth, and throat: ETT in place, small thick secretions, copious oral secretions pouring out of mouth Cardiovascular: tachycardic, ext warm Respiratory: Transmitted upper airway sounds, tachypneic on vent Gastrointestinal: soft, hypoactive BS Skin: No rashes, normal turgor Neurologic: heavily sedated, cannot get to withdraw Psychiatric: RASS -5  Cr 2.45 (1.51 back in Feb) AST/ALT up, UA +mod blood 0-5 RBC (rhabdo likely) ABG acidemic mixed resp insufficiency, acidemia  Resolved Hospital Problem list   n/a  Assessment & Plan:  Acute hypoxemic respiratory failure in setting of seizure like activity- fever from seizure itself vs. Aspiration - Usual vent support/ VAP prevention bundle - CXR in AM - Increased RR, recheck ABG around 20:00  Question sepsis - Vanc/zosyn, f/u culture data, narrow PRN  Acute metabolic encephalopathy question status epilepticus, question postictal.  Hx polysubstance abuse - EDP to try LP - Check CPK - Seizure precautions - f/u UDS - Neuro consulted, EEG pending - PRN benzo, propofol for suppression  Abnormal CT head- consider MRI if not waking up  Acute on chronic renal failure- suspect rhabdo from seizures, dehydration - Aggressive hydration, avoid nephrotoxins, trend CK/Cr  DM2- low currently, just trend for now  Best practice:  Diet: NPO Pain/Anxiety/Delirium protocol (if indicated): prop/fent VAP protocol (if indicated): in place DVT prophylaxis: heparin GI prophylaxis: ppi Glucose control: ssi Mobility: BR Code Status: full Family Communication: will reach out Disposition: ICU pending further workup   Medical  Decision Making    Diagnoses that are immediately life threatening include acute metabolic encephalopathy, respiratory  failure, renal failure Critical test findings: intubated Interventions today to address these diagnoses are mechanical ventilation titration, EEG, abx, fluids Likelihood of life-threatening deterioration without intervention is high.  Labs   CBC: Recent Labs  Lab 09/03/20 1623 09/03/20 1803  WBC 8.2  --   NEUTROABS 6.4  --   HGB 12.7* 13.3  HCT 41.8 39.0  MCV 105.3*  --   PLT 208  --     Basic Metabolic Panel: Recent Labs  Lab 09/03/20 1623 09/03/20 1803  NA 139 139  K 5.7* 5.4*  CL 108  --   CO2 13*  --   GLUCOSE 73  --   BUN 21  --   CREATININE 2.45*  --   CALCIUM 8.6*  --    GFR: Estimated Creatinine Clearance: 33.7 mL/min (A) (by C-G formula based on SCr of 2.45 mg/dL (H)). Recent Labs  Lab 09/03/20 1623  WBC 8.2    Liver Function Tests: Recent Labs  Lab 09/03/20 1623  AST 502*  ALT 147*  ALKPHOS 97  BILITOT 0.8  PROT 7.0  ALBUMIN 3.6   No results for input(s): LIPASE, AMYLASE in the last 168 hours. No results for input(s): AMMONIA in the last 168 hours.  ABG    Component Value Date/Time   PHART 7.133 (LL) 09/03/2020 1803   PCO2ART 54.3 (H) 09/03/2020 1803   PO2ART 108 09/03/2020 1803   HCO3 17.9 (L) 09/03/2020 1803   TCO2 19 (L) 09/03/2020 1803   ACIDBASEDEF 11.0 (H) 09/03/2020 1803   O2SAT 95.0 09/03/2020 1803     Coagulation Profile: No results for input(s): INR, PROTIME in the last 168 hours.  Cardiac Enzymes: No results for input(s): CKTOTAL, CKMB, CKMBINDEX, TROPONINI in the last 168 hours.  HbA1C: Hgb A1c MFr Bld  Date/Time Value Ref Range Status  01/10/2019 05:08 PM 6.4 (H) 4.8 - 5.6 % Final    Comment:    (NOTE) Pre diabetes:          5.7%-6.4% Diabetes:              >6.4% Glycemic control for   <7.0% adults with diabetes   07/14/2018 05:57 AM 8.8 (H) 4.8 - 5.6 % Final    Comment:    (NOTE) Pre diabetes:          5.7%-6.4% Diabetes:              >6.4% Glycemic control for   <7.0% adults with diabetes      CBG: Recent Labs  Lab 09/03/20 1619  GLUCAP 70    Review of Systems:   Cannot assess, comatose  Past Medical History  He,  has a past medical history of Arthritis, Depression, DVT (deep venous thrombosis) (HCC) (LLE), Hepatitis C, Hypercholesterolemia, Hypertension, Prostate cancer ([place]) (78yrs ago), Pulmonary embolism and infarction ([place]) (07/13/2018), Sleep apnea, and Type II diabetes mellitus ([place]).   Surgical History    Past Surgical History:  Procedure Laterality Date  . BIOPSY  01/12/2019   Procedure: BIOPSY;  Surgeon: Ronald Lobo, MD;  Location: [facility] ENDOSCOPY;  Service: Endoscopy;;  . BIOPSY  06/23/2019   Procedure: BIOPSY;  Surgeon: Arta Silence, MD;  Location: [facility];  Service: Endoscopy;;  . COLONOSCOPY WITH PROPOFOL N/A 02/19/2014   Procedure: COLONOSCOPY WITH PROPOFOL;  Surgeon: Garlan Fair, MD;  Location: [facility] ENDOSCOPY;  Service: Endoscopy;  Laterality: N/A;  . ENDOVENOUS ABLATION SAPHENOUS  VEIN W/ LASER Left 11/22/2017   endovenous laser ablation L SSV by Tinnie Gens MD   . ESOPHAGEAL BRUSHING  06/23/2019   Procedure: ESOPHAGEAL BRUSHING;  Surgeon: Arta Silence, MD;  Location: [facility];  Service: Endoscopy;;  . ESOPHAGOGASTRODUODENOSCOPY (EGD) WITH PROPOFOL N/A 01/12/2019   Procedure: ESOPHAGOGASTRODUODENOSCOPY (EGD) WITH PROPOFOL;  Surgeon: Ronald Lobo, MD;  Location: [facility];  Service: Endoscopy;  Laterality: N/A;  Patient is also scheduled for barium swallow; please notify radiology after patient's EGD is complete so that barium swallow follows the endoscopy, not vice versa  . ESOPHAGOGASTRODUODENOSCOPY (EGD) WITH PROPOFOL N/A 06/23/2019   Procedure: ESOPHAGOGASTRODUODENOSCOPY (EGD) WITH PROPOFOL;  Surgeon: Arta Silence, MD;  Location: [facility];  Service: Endoscopy;  Laterality: N/A;  . PROSTATECTOMY  2008  . REPAIR QUADRICEPS / HAMSTRING MUSCLE Right      Social History   reports that he has been smoking cigarettes. He has a  5.40 pack-year smoking history. He has never used smokeless tobacco. He reports current alcohol use. He reports current drug use.   Family History   His family history includes Cancer in his father.   Allergies No Known Allergies   Home Medications  Prior to Admission medications   Medication Sig Start Date End Date Taking? Authorizing Provider  amLODipine (NORVASC) 10 MG tablet Take 1 tablet (10 mg total) by mouth daily. 06/30/19 07/30/19  [place] Donnamarie Poag, DO  etodolac (LODINE) 300 MG capsule Take 1 capsule (300 mg total) by mouth every 8 (eight) hours. 06/16/20   Dorie Rank, MD  metoprolol tartrate (LOPRESSOR) 25 MG tablet Take 1 tablet (25 mg total) by mouth 2 (two) times daily. 06/30/19 07/30/19  [place] Eric J, DO  pantoprazole (PROTONIX) 40 MG tablet Take 1 tablet (40 mg total) by mouth 2 (two) times daily. 09/26/19 10/26/19  Caccavale, Sophia, PA-C  predniSONE (DELTASONE) 50 MG tablet Take 1 tablet (50 mg total) by mouth daily. 06/16/20   Dorie Rank, MD  sertraline (ZOLOFT) 25 MG tablet Take 1 tablet (25 mg total) by mouth daily. 06/30/19   [place] Eric J, DO  sucralfate (CARAFATE) 1 GM/10ML suspension Take 10 mLs (1 g total) by mouth 4 (four) times daily -  with meals and at bedtime. 09/26/19 10/26/19  Caccavale, Sophia, PA-C     Critical care time: 34 minutes

## 2020-09-03 NOTE — Consult Note (Addendum)
Neurology Consult H&P  CC: seizure  History is obtained from: chart as the patient was just intubated  HPI: Todd [name]. is a 65 y.o. male unknown handedness, homeless PMHx as reviewed below found unresponsive at shelter and EMS transported to [facility] ED Scr 2.45, elevated from baseline ~1.5. WBC 8.2, Tmax 100.7. Lactic acid pending and he met criteria for sepsis of unknown source. He was thought to be in status epilepticus and subsequently intubated.   Chart review indicates multiple prior admissions for EtOH intoxication, cocaine and opiate use.  The following information was taken from the chart: "The patient was last seen normal around 10 AM.  They were called to the homeless shelter for unresponsiveness.  He was unresponsive and it is unclear if he was completely apneic or having trouble breathing.  He was given 2 mg intranasal Narcan and did seem to wake up some.  However at some point he then had a seizure lasting a minute and a half.  Since then he has not regained normal mental status.  When he woke up from the Narcan it seem like he looked around a little bit but never fully recovered.  He has a history of opiate abuse.  Otherwise history is quite limited."  "Initially eyes deviated to left, seems to normalize during initial exam"; "Patient does not respond to pain or move extremities.  His left leg and arm seem a little stiff compared to the right side."  ROS: Unable to assess due to encephalopathy, intubated and sedated  Past Medical History:  Diagnosis Date  . Arthritis    "fingers" (07/14/2018)  . Depression   . DVT (deep venous thrombosis) (HCC) LLE  . Hepatitis C     finished harvoni tx ~ 2017  . Hypercholesterolemia   . Hypertension   . Prostate cancer ([place]) 56yr ago  . [place] 07[place]13[place]  . Sleep apnea    not currently using cpap, mask causing vertigo  . Type II diabetes mellitus (HCC)      Family History  Problem Relation Age of  Onset  . Cancer Father        PROSTATE    Social History:  reports that he has been smoking cigarettes. He has a 5.40 pack-year smoking history. He has never used smokeless tobacco. He reports current alcohol use. He reports current drug use.   Prior to Admission medications   Medication Sig Start Date End Date Taking? Authorizing Provider  amLODipine (NORVASC) 10 MG tablet Take 1 tablet (10 mg total) by mouth daily. 06/30/19 07/30/19  [place] EDonnamarie Poag DO  etodolac (LODINE) 300 MG capsule Take 1 capsule (300 mg total) by mouth every 8 (eight) hours. 06/16/20   KDorie Rank MD  metoprolol tartrate (LOPRESSOR) 25 MG tablet Take 1 tablet (25 mg total) by mouth 2 (two) times daily. 06/30/19 07/30/19  [place] Eric J, DO  pantoprazole (PROTONIX) 40 MG tablet Take 1 tablet (40 mg total) by mouth 2 (two) times daily. 09/26/19 10/26/19  Caccavale, Sophia, PA-C  predniSONE (DELTASONE) 50 MG tablet Take 1 tablet (50 mg total) by mouth daily. 06/16/20   KDorie Rank MD  sertraline (ZOLOFT) 25 MG tablet Take 1 tablet (25 mg total) by mouth daily. 06/30/19   [place] Eric J, DO  sucralfate (CARAFATE) 1 GM/10ML suspension Take 10 mLs (1 g total) by mouth 4 (four) times daily -  with meals and at bedtime. 09/26/19 10/26/19  CFranchot Heidelberg PA-C    Exam: Current vital  signs: BP (!) 81/71   Pulse 96   Temp (!) 100.9 F (38.3 C)   Resp (!) 24   Ht 5' 6"  (1.676 m)   Wt 100 kg   SpO2 97%   BMI 35.58 kg/m   Physical Exam  Constitutional: Appears well-developed and well-nourished.  Psych: Unable to assess due to encephalopathy, intubated and sedated Eyes: No scleral injection HENT: No OP obstruction. Head: Normocephalic.  Cardiovascular: Normal rate and regular rhythm.  Respiratory: intubated, symmetric excursions bilaterally, no audible wheezing. GI: Soft.  No distension. There is no tenderness.  Skin: WDI  Neuro: Exam is limited due to encephalopathy, intubated and sedated Mental Status: Unable to  assess due to encephalopathy, intubated and sedated Cranial Nerves: Pupils are equal, round, and sluggishly reactive to light. Doll's eye (+) Facial grossly symmetric ET tube in blace.  Motor: Tone is normal. Bulk is normal. Does not move extremities. Sensory: Mildly withdraws to pain symmetrically Deep Tendon Reflexes: unable to elicit Plantars: Mute Cerebellar: Unable to assess due to encephalopathy, intubated and sedated  I have reviewed labs in epic and the pertinent results are:   Ref. Range 09/03/2020 18:03  pH, Arterial Latest Ref Range: 7.350 - 7.450  7.133 (LL)  pCO2 arterial Latest Ref Range: 32.0 - 48.0 mmHg 54.3 (H)  pO2, Arterial Latest Ref Range: 83.0 - 108.0 mmHg 108  TCO2 Latest Ref Range: 22 - 32 mmol/L 19 (L)  Acid-base deficit Latest Ref Range: 0.0 - 2.0 mmol/L 11.0 (H)  Bicarbonate Latest Ref Range: 20.0 - 28.0 mmol/L 17.9 (L)  O2 Saturation Latest Units: % 95.0  Patient temperature Unknown 100.7 F    Ref. Range 09/03/2020 16:23  Troponin I (High Sensitivity) Latest Ref Range: <18 ng/L 396 (HH)    Ref. Range 09/03/2020 18:03  Potassium Latest Ref Range: 3.5 - 5.1 mmol/L 5.4 (H)    Ref. Range 09/03/2020 16:23  Acetaminophen (Tylenol), S Latest Ref Range: 10 - 30 ug/mL <17 (L)  Salicylate Lvl Latest Ref Range: 7.0 - 30.0 mg/dL <7.0 (L)  Glucose Latest Ref Range: 70 - 99 mg/dL 73    Ref. Range 09/03/2020 16:23  AST Latest Ref Range: 15 - 41 U/L 502 (H)  ALT Latest Ref Range: 0 - 44 U/L 147 (H)    I have reviewed the images obtained: NCT head showed subtle ill-defined hypoattenuation within the bilateral thalami which may reflect progressive chronic small vessel ischemic disease. Subtle changes of hypoxic/ischemic injury cannot be excluded.   Assessment: Todd [name]. is a 65 y.o. male homeless PMHx HTN, EtOH, polysubstance abuse found unresponsive for unknown period of time and in respiratory failure, mildly febrile likely septic from unknown source  presumed to be in nonconvulsive status epilepticus. Seizure was likely provoked and labs at this point suggest multifactorial due to metabolic derangement, respiratory failure with hemodynamic instability.  EEG is in progress. LP done results pending Started on empiric antibiotics.  Impression:  Provoked seizure Status epilepticus Acute encephalopathy Sepsis Elevated liver enzymes Hyperkalemia Hyperglycemia Unresponsive Intubated and sedated History of EtOH, cocaine/polysubstance abuse Homeless    Plan: - MRI brain without contrast. - Stat EEG in progress. - Continue antibiotics - Lab results and cultures pending  - Continue to correct metabolic derangement - Currently hypotensive - Maintain MAP >65/SBP >100 - No current evidence to support antiseizure medication at this time - Neurology will continue to follow  This patient is critically ill and at significant risk of neurological worsening, death and care requires constant monitoring  of vital signs, hemodynamics,respiratory and cardiac monitoring, neurological assessment, discussion with family, other specialists and medical decision making of high complexity. I spent 73 minutes of neurocritical care time  in the care of  this patient. This was time spent independent of any time provided by nurse practitioner or PA.  Electronically signed by:  Lynnae Sandhoff, MD Page: [phone] 09/03/2020, 7:15 PM

## 2020-09-03 NOTE — Progress Notes (Addendum)
[facility] Progress Note Patient Name: Todd [name]. DOB: [birth year] MRN: [id number]   Date of Service  09/03/2020  HPI/Events of Note  Homeless shelter patient with a history of polysubstance abuse admitted with seizures, and altered mental status, he required intubation and is currently on the ventilator, work up of the etiology of his seizures is ongoing, including an LP.  eICU Interventions  New Patient Evaluation completed. Bilateral soft wrist restraints ordered to prevent self extubation.        [name] [name] 09/03/2020, 10:25 PM

## 2020-09-03 NOTE — Procedures (Signed)
Patient Name: Markeis Allman.  MRN: [id number]  Epilepsy Attending: Lora Havens  Referring Physician/Provider: Dr Sherwood Gambler Date: 09/04/2020 Duration: 22.44 mins  Patient history: 65yo M with ams. EEG to evaluate for seizure  Level of alertness:  comatose  AEDs during EEG study: Propofol  Technical aspects: This EEG study was done with scalp electrodes positioned according to the 10-20 International system of electrode placement. Electrical activity was acquired at a sampling rate of 500Hz  and reviewed with a high frequency filter of 70Hz  and a low frequency filter of 1Hz . EEG data were recorded continuously and digitally stored.   Description: EEG showed continuous generalized 3 to 6 Hz theta-delta slowing admixed with intermittent 15-18hz  beta activity in frontocentral region.  Hyperventilation and photic stimulation were not performed.     ABNORMALITY -Continuous slow, generalized  IMPRESSION: This study is suggestive of severe diffuse encephalopathy, nonspecific etiology but likely related to sedation. No seizures or epileptiform discharges were seen throughout the recording.  Jeweldean Drohan Barbra Sarks

## 2020-09-04 ENCOUNTER — Inpatient Hospital Stay (HOSPITAL_COMMUNITY): Payer: Medicare HMO

## 2020-09-04 DIAGNOSIS — M7989 Other specified soft tissue disorders: Secondary | ICD-10-CM

## 2020-09-04 DIAGNOSIS — G9340 Encephalopathy, unspecified: Secondary | ICD-10-CM

## 2020-09-04 DIAGNOSIS — J9601 Acute respiratory failure with hypoxia: Secondary | ICD-10-CM | POA: Diagnosis not present

## 2020-09-04 DIAGNOSIS — U071 COVID-19: Secondary | ICD-10-CM

## 2020-09-04 DIAGNOSIS — N179 Acute kidney failure, unspecified: Secondary | ICD-10-CM | POA: Diagnosis not present

## 2020-09-04 DIAGNOSIS — R569 Unspecified convulsions: Secondary | ICD-10-CM | POA: Diagnosis not present

## 2020-09-04 LAB — TYPE AND SCREEN
ABO/RH(D): A POS
Antibody Screen: NEGATIVE

## 2020-09-04 LAB — CBC
HCT: 37.4 % — ABNORMAL LOW (ref 39.0–52.0)
Hemoglobin: 11.9 g/dL — ABNORMAL LOW (ref 13.0–17.0)
MCH: 32.1 pg (ref 26.0–34.0)
MCHC: 31.8 g/dL (ref 30.0–36.0)
MCV: 100.8 fL — ABNORMAL HIGH (ref 80.0–100.0)
Platelets: 135 10*3/uL — ABNORMAL LOW (ref 150–400)
RBC: 3.71 MIL/uL — ABNORMAL LOW (ref 4.22–5.81)
RDW: 15.9 % — ABNORMAL HIGH (ref 11.5–15.5)
WBC: 7.4 10*3/uL (ref 4.0–10.5)
nRBC: 0 % (ref 0.0–0.2)

## 2020-09-04 LAB — COMPREHENSIVE METABOLIC PANEL
ALT: 203 U/L — ABNORMAL HIGH (ref 0–44)
AST: 974 U/L — ABNORMAL HIGH (ref 15–41)
Albumin: 2.9 g/dL — ABNORMAL LOW (ref 3.5–5.0)
Alkaline Phosphatase: 66 U/L (ref 38–126)
Anion gap: 11 (ref 5–15)
BUN: 26 mg/dL — ABNORMAL HIGH (ref 8–23)
CO2: 17 mmol/L — ABNORMAL LOW (ref 22–32)
Calcium: 7.8 mg/dL — ABNORMAL LOW (ref 8.9–10.3)
Chloride: 112 mmol/L — ABNORMAL HIGH (ref 98–111)
Creatinine, Ser: 2.3 mg/dL — ABNORMAL HIGH (ref 0.61–1.24)
GFR, Estimated: 31 mL/min — ABNORMAL LOW (ref >60)
Glucose, Bld: 117 mg/dL — ABNORMAL HIGH (ref 70–99)
Potassium: 4.4 mmol/L (ref 3.5–5.1)
Sodium: 140 mmol/L (ref 135–145)
Total Bilirubin: 0.7 mg/dL (ref 0.3–1.2)
Total Protein: 5.6 g/dL — ABNORMAL LOW (ref 6.5–8.1)

## 2020-09-04 LAB — MAGNESIUM: Magnesium: 1.4 mg/dL — ABNORMAL LOW (ref 1.7–2.4)

## 2020-09-04 LAB — HEPATITIS PANEL, ACUTE
HCV Ab: REACTIVE — AB
Hep A IgM: NONREACTIVE
Hep B C IgM: NONREACTIVE
Hepatitis B Surface Ag: NONREACTIVE

## 2020-09-04 LAB — GLUCOSE, CAPILLARY
Glucose-Capillary: 66 mg/dL — ABNORMAL LOW (ref 70–99)
Glucose-Capillary: 118 mg/dL — ABNORMAL HIGH (ref 70–99)
Glucose-Capillary: 79 mg/dL (ref 70–99)
Glucose-Capillary: 108 mg/dL — ABNORMAL HIGH (ref 70–99)
Glucose-Capillary: 62 mg/dL — ABNORMAL LOW (ref 70–99)
Glucose-Capillary: 70 mg/dL (ref 70–99)

## 2020-09-04 LAB — PHOSPHORUS: Phosphorus: 3.7 mg/dL (ref 2.5–4.6)

## 2020-09-04 LAB — CK: Total CK: 674 U/L — ABNORMAL HIGH (ref 49–397)

## 2020-09-04 LAB — LACTIC ACID, PLASMA: Lactic Acid, Venous: 2.6 mmol/L (ref 0.5–1.9)

## 2020-09-04 MED ORDER — DEXTROSE 50 % IV SOLN: 12.5000 g | INTRAVENOUS | Status: AC

## 2020-09-04 MED ORDER — PROPOFOL 1000 MG/100ML IV EMUL: 5.0000 ug/kg/min | INTRAVENOUS | Status: DC

## 2020-09-04 MED ORDER — ACETAMINOPHEN 325 MG PO TABS
650.0000 mg | ORAL_TABLET | ORAL | Status: DC | PRN
Start: 2020-09-04 — End: 2020-09-06
  Administered 2020-09-05: 650 mg
  Filled 2020-09-04: qty 2

## 2020-09-04 MED ORDER — THIAMINE HCL 100 MG PO TABS
100.0000 mg | ORAL_TABLET | Freq: Every day | ORAL | Status: DC
  Administered 2020-09-04 – 2020-09-12 (×9): 100 mg
  Filled 2020-09-04 (×9): qty 1

## 2020-09-04 MED ORDER — MAGNESIUM SULFATE 4 GM/100ML IV SOLN
4.0000 g | Freq: Once | INTRAVENOUS | Status: AC
  Administered 2020-09-04: 4 g via INTRAVENOUS
  Filled 2020-09-04: qty 100

## 2020-09-04 MED ORDER — DEXTROSE 50 % IV SOLN
INTRAVENOUS | Status: AC
  Administered 2020-09-04: 12.5 g via INTRAVENOUS
  Filled 2020-09-04: qty 50

## 2020-09-04 MED ORDER — CHLORHEXIDINE GLUCONATE CLOTH 2 % EX PADS
6.0000 | MEDICATED_PAD | Freq: Every day | CUTANEOUS | Status: DC
  Administered 2020-09-04 – 2020-09-28 (×10): 6 via TOPICAL

## 2020-09-04 MED ORDER — ADULT MULTIVITAMIN W/MINERALS CH
1.0000 | ORAL_TABLET | Freq: Every day | ORAL | Status: DC
  Administered 2020-09-04 – 2020-09-12 (×9): 1
  Filled 2020-09-04 (×9): qty 1

## 2020-09-04 MED ORDER — FOLIC ACID 1 MG PO TABS
1.0000 mg | ORAL_TABLET | Freq: Every day | ORAL | Status: DC
  Administered 2020-09-04 – 2020-09-12 (×9): 1 mg
  Filled 2020-09-04 (×9): qty 1

## 2020-09-04 MED ORDER — POLYETHYLENE GLYCOL 3350 17 G PO PACK: 17.0000 g | PACK | Freq: Every day | ORAL | Status: DC | PRN

## 2020-09-04 MED ORDER — SODIUM CHLORIDE 0.9 % IV SOLN
3.0000 g | Freq: Three times a day (TID) | INTRAVENOUS | Status: DC
  Administered 2020-09-04 – 2020-09-08 (×13): 3 g via INTRAVENOUS
  Filled 2020-09-04 (×2): qty 3
  Filled 2020-09-04: qty 8
  Filled 2020-09-04 (×6): qty 3
  Filled 2020-09-04 (×5): qty 8
  Filled 2020-09-04: qty 3

## 2020-09-04 MED ORDER — PANTOPRAZOLE SODIUM 40 MG PO PACK
40.0000 mg | PACK | Freq: Every day | ORAL | Status: DC
  Administered 2020-09-04 – 2020-09-11 (×7): 40 mg
  Filled 2020-09-04 (×9): qty 20

## 2020-09-04 MED ORDER — DEXTROSE 5 % IV SOLN
10.0000 mg/kg | Freq: Two times a day (BID) | INTRAVENOUS | Status: DC
  Filled 2020-09-04: qty 12.8

## 2020-09-04 NOTE — [facility] Nurse Program (Signed)
  Dept: [facility] Nurse Program Note  Date of Encounter: 09/03/2020  Past Medical History: Past Medical History:  Diagnosis Date  . Arthritis    "fingers" (07/14/2018)  . Depression   . DVT (deep venous thrombosis) (HCC) LLE  . Hepatitis C     finished harvoni tx ~ 2017  . Hypercholesterolemia   . Hypertension   . Prostate cancer ([place]) 23yrs ago  . Pulmonary embolism and infarction ([place]) 07/13/2018  . Sleep apnea    not currently using cpap, mask causing vertigo  . Type II diabetes mellitus ([place])     Encounter Details:  CNP Questionnaire - 09/03/20 1500      Questionnaire   Do you give verbal consent to treat you today? No    Visit Setting Other    Location Patient Served At [facility]    Patient Status Homeless    Medical Provider No    Insurance Unknown    Intervention Support;Refer    Housing/Utilities No permanent housing    Transportation Need transportation assistance    Referrals Emergency [place] Yes         nurse was called to clients room to check on his status ,client foaming at the [facility] ,ask staff to call 911 crisis ,difficulty breathing ,no known history, EMS on the scene ,took over care and transported  client to ED

## 2020-09-04 NOTE — Progress Notes (Signed)
Left lower extremity venous study completed.   Results to RN  Please see CV Proc for preliminary results.   Vonzell Schlatter, RVT

## 2020-09-04 NOTE — Procedures (Signed)
Extubation Procedure Note  Patient Details:   Name: Todd [name]. DOB: 22-Jan-1956 MRN: [id number]   Airway Documentation:    Vent end date: 09/04/20 Vent end time: 1120   Evaluation  O2 sats: stable throughout Complications: No apparent complications Patient did tolerate procedure well. Bilateral Breath Sounds: Rhonchi,Diminished (POSTY EXTUBATION)   Yes   Pt extubated to 4L [place] per MD order. Pt had positive cuff leak prior to extubation. Pt had strong productive cough post extubation and able to speak. RT will continue to monitor  Jesse Sans 09/04/2020, 4:53 PM

## 2020-09-04 NOTE — Progress Notes (Signed)
NAME:  Todd [name]., MRN:  [id number], DOB:  03/29/1956, LOS: 1 ADMISSION DATE:  09/03/2020, CONSULTATION DATE:  09/03/20 REFERRING MD:  Regenia Skeeter, CHIEF COMPLAINT:  Found down   Brief History   66 year old w/ hx of DM, HTN, CKD, EtOH/tobacco/heroin abuse presenting from homeless shelter after being found down intubated in ER. Seizure-like activity noted after intubation  Ethanol/salicylate/Tylenol negative UDS negative  Past Medical History  HTN DM EtOH abuse Tobacco abuse Heroin abuse CKD  Significant Hospital Events   1/19 admitted  Consults:  Neurology  Procedures:  N/A  Significant Diagnostic Tests:  09/04/19 CT head IMPRESSION: Subtle ill-defined hypoattenuation within the bilateral thalami. Findings may reflect progressive chronic small vessel ischemic disease. Subtle changes of hypoxic/ischemic injury cannot be excluded. Consider brain MRI for further evaluation, as clinically warranted. No acute intracranial hemorrhage or acute demarcated cortical infarction. Moderate cerebral white matter disease, nonspecific but most often secondary to chronic small vessel ischemia. Mild cerebral atrophy Paranasal sinus disease as described.  1/19 EEG: Diffuse encephalopathy, no seizures  1/19 LP: WBC 1   Micro Data:  1/19 COVID PCR positive 1/19 urine culture > 1/19 blood cultures >   Antimicrobials:  Vanc>> 1/19 -1/20 Zosyn>> 1/19 -1/20 Acyclovir > 1/19 -1/20 Unasyn 1/20 >  Interim history/subjective:  Patient is tolerating pressure support trial, will watch for respiratory distress, if he tolerated for 30 minutes, will try to extubate him  Objective   Blood pressure 102/69, pulse 75, temperature (!) 100.7 F (38.2 C), temperature source Oral, resp. rate 18, height 5\' 6"  (1.676 m), weight 89 kg, SpO2 96 %.    Vent Mode: PRVC FiO2 (%):  [40 %-100 %] 40 % Set Rate:  [15 bmp-25 bmp] 25 bmp Vt Set:  [510 mL] 510 mL PEEP:  [5 cmH20] 5 cmH20 Plateau  Pressure:  [17 cmH20-24 cmH20] 22 cmH20   Intake/Output Summary (Last 24 hours) at 09/04/2020 1113 Last data filed at 09/04/2020 0900 Gross per 24 hour  Intake 1081.6 ml  Output 800 ml  Net 281.6 ml   Filed Weights   09/03/20 1649 09/03/20 2147 09/04/20 0339  Weight: 100 kg 89 kg 89 kg    Examination: Constitutional: Chronically ill-appearing male, orally intubated Eyes: pupils reactive, equal Ears, nose, mouth, and throat: ETT in place, small thick secretions, copious oral secretions pouring out of mouth Cardiovascular: Regular rate and rhythm Respiratory: Crackles at the bases bilaterally, no wheezes or rhonchi Gastrointestinal: soft, hypoactive BS Skin: No rashes, normal turgor Neurologic: Opens eyes with vocal stimuli, following simple commands, moving all 4 extremities  Resolved Hospital Problem list   n/a  Assessment & Plan:  Acute hypoxemic respiratory failure, multifactorial Aspiration pneumonia/COVID-19 infection  Patient is tolerating pressure support trial, will try to extubate him today Continue IV Unasyn  Sepsis/acute meningitis/encephalitis was ruled out LP showed only 1 WBC CSF culture is negative  Acute metabolic encephalopathy Patient had seizure-like activity postextubation, then he remained unresponsive Now he is waking up EEG was negative for active seizures but consistent with diffuse encephalopathy He is off sedation and following commands UDS is negative for toxic substances Appreciate neurology input  Abnormal CT head Patient's head CT was concerning for bilateral strokes and thalami, MRI brain is pending  AKI on CKD stage IIIa Patient serum creatinine is improving Continue to monitor  DM2- low currently, just trend for now  Best practice:  Diet: NPO for possible extubation Pain/Anxiety/Delirium protocol (if indicated): N/A VAP protocol (if indicated): in place DVT  prophylaxis: heparin GI prophylaxis: ppi Glucose control:  ssi Mobility: BR for now Code Status: full Disposition: ICU  Labs   CBC: Recent Labs  Lab 09/03/20 1623 09/03/20 1803 09/03/20 2009 09/04/20 0348  WBC 8.2  --  6.7 7.4  NEUTROABS 6.4  --   --   --   HGB 12.7* 13.3 11.5* 11.9*  HCT 41.8 39.0 38.5* 37.4*  MCV 105.3*  --  104.3* 100.8*  PLT 208  --  163 135*    Basic Metabolic Panel: Recent Labs  Lab 09/03/20 1623 09/03/20 1803 09/03/20 2009 09/04/20 0348  NA 139 139  --  140  K 5.7* 5.4*  --  4.4  CL 108  --   --  112*  CO2 13*  --   --  17*  GLUCOSE 73  --   --  117*  BUN 21  --   --  26*  CREATININE 2.45*  --   --  2.30*  CALCIUM 8.6*  --   --  7.8*  MG  --   --  1.6* 1.4*  PHOS  --   --  6.3* 3.7   GFR: Estimated Creatinine Clearance: 33.9 mL/min (A) (by C-G formula based on SCr of 2.3 mg/dL (H)). Recent Labs  Lab 09/03/20 1623 09/03/20 1705 09/03/20 2009 09/04/20 0348  PROCALCITON  --   --  3.60  --   WBC 8.2  --  6.7 7.4  LATICACIDVEN  --  3.0*  --  2.6*    Liver Function Tests: Recent Labs  Lab 09/03/20 1623 09/04/20 0348  AST 502* 974*  ALT 147* 203*  ALKPHOS 97 66  BILITOT 0.8 0.7  PROT 7.0 5.6*  ALBUMIN 3.6 2.9*   No results for input(s): LIPASE, AMYLASE in the last 168 hours. No results for input(s): AMMONIA in the last 168 hours.  ABG    Component Value Date/Time   PHART 7.133 (LL) 09/03/2020 1803   PCO2ART 54.3 (H) 09/03/2020 1803   PO2ART 108 09/03/2020 1803   HCO3 17.9 (L) 09/03/2020 1803   TCO2 19 (L) 09/03/2020 1803   ACIDBASEDEF 11.0 (H) 09/03/2020 1803   O2SAT 95.0 09/03/2020 1803     Coagulation Profile: Recent Labs  Lab 09/03/20 2009  INR 1.4*    Cardiac Enzymes: Recent Labs  Lab 09/03/20 2009 09/04/20 0348  CKTOTAL 367 674*    HbA1C: Hgb A1c MFr Bld  Date/Time Value Ref Range Status  09/03/2020 08:09 PM 5.5 4.8 - 5.6 % Final    Comment:    (NOTE) Pre diabetes:          5.7%-6.4%  Diabetes:              >6.4%  Glycemic control for   <7.0% adults  with diabetes   01/10/2019 05:08 PM 6.4 (H) 4.8 - 5.6 % Final    Comment:    (NOTE) Pre diabetes:          5.7%-6.4% Diabetes:              >6.4% Glycemic control for   <7.0% adults with diabetes     CBG: Recent Labs  Lab 09/03/20 2318 09/04/20 0316 09/04/20 0402 09/04/20 0813 09/04/20 1050  GLUCAP 86 66* 118* 79 62*    Past Medical History  He,  has a past medical history of Arthritis, Depression, DVT (deep venous thrombosis) (HCC) ([facility]), Hepatitis C, Hypercholesterolemia, Hypertension, Prostate cancer ([place]) (86yrs ago), Pulmonary embolism and infarction ([place]) (07/13/2018), Sleep apnea, and  Type II diabetes mellitus ([place]).   Surgical History    Past Surgical History:  Procedure Laterality Date  . BIOPSY  01/12/2019   Procedure: BIOPSY;  Surgeon: Ronald Lobo, MD;  Location[facility] ENDOSCOPY;  Service: Endoscopy;;  . BIOPSY  06/23/2019   Procedure: BIOPSY;  Surgeon: Arta Silence, MD;  Location: [facility];  Service: Endoscopy;;  . COLONOSCOPY WITH PROPOFOL N/A 02/19/2014   Procedure: COLONOSCOPY WITH PROPOFOL;  Surgeon: Garlan Fair, MD;  Location: [facility] ENDOSCOPY;  Service: Endoscopy;  Laterality: N/A;  . ENDOVENOUS ABLATION SAPHENOUS VEIN W/ LASER Left 11/22/2017   endovenous laser ablation L SSV by Tinnie Gens MD   . ESOPHAGEAL BRUSHING  06/23/2019   Procedure: ESOPHAGEAL BRUSHING;  Surgeon: Arta Silence, MD;  Location: [facility];  Service: Endoscopy;;  . ESOPHAGOGASTRODUODENOSCOPY (EGD) WITH PROPOFOL N/A 01/12/2019   Procedure: ESOPHAGOGASTRODUODENOSCOPY (EGD) WITH PROPOFOL;  Surgeon: Ronald Lobo, MD;  Location: [facility] [facility];  Service: Endoscopy;  Laterality: N/A;  Patient is also scheduled for barium swallow; please notify radiology after patient's EGD is complete so that barium swallow follows the endoscopy, not vice versa  . ESOPHAGOGASTRODUODENOSCOPY (EGD) WITH PROPOFOL N/A 06/23/2019   Procedure: ESOPHAGOGASTRODUODENOSCOPY (EGD) WITH PROPOFOL;  Surgeon:  Arta Silence, MD;  Location: [facility];  Service: Endoscopy;  Laterality: N/A;  . PROSTATECTOMY  2008  . REPAIR QUADRICEPS / HAMSTRING MUSCLE Right      Social History   reports that he has been smoking cigarettes. He has a 5.40 pack-year smoking history. He has never used smokeless tobacco. He reports current alcohol use. He reports current drug use.   Family History   His family history includes Cancer in his father.   Allergies No Known Allergies   Home Medications  Prior to Admission medications   Medication Sig Start Date End Date Taking? Authorizing Provider  amLODipine (NORVASC) 10 MG tablet Take 1 tablet (10 mg total) by mouth daily. 06/30/19 07/30/19  [place] Donnamarie Poag, DO  etodolac (LODINE) 300 MG capsule Take 1 capsule (300 mg total) by mouth every 8 (eight) hours. 06/16/20   Dorie Rank, MD  metoprolol tartrate (LOPRESSOR) 25 MG tablet Take 1 tablet (25 mg total) by mouth 2 (two) times daily. 06/30/19 07/30/19  [place] Eric J, DO  pantoprazole (PROTONIX) 40 MG tablet Take 1 tablet (40 mg total) by mouth 2 (two) times daily. 09/26/19 10/26/19  Caccavale, Sophia, PA-C  predniSONE (DELTASONE) 50 MG tablet Take 1 tablet (50 mg total) by mouth daily. 06/16/20   Dorie Rank, MD  sertraline (ZOLOFT) 25 MG tablet Take 1 tablet (25 mg total) by mouth daily. 06/30/19   [place] Eric J, DO  sucralfate (CARAFATE) 1 GM/10ML suspension Take 10 mLs (1 g total) by mouth 4 (four) times daily -  with meals and at bedtime. 09/26/19 10/26/19  Caccavale, Sophia, PA-C    Total critical care time: 40 minutes  Performed by: [place] care time was exclusive of separately billable procedures and treating other patients.   Critical care was necessary to treat or prevent imminent or life-threatening deterioration.   Critical care was time spent personally by me on the following activities: development of treatment plan with patient and/or surrogate as well as nursing, discussions with  consultants, evaluation of patient's response to treatment, examination of patient, obtaining history from patient or surrogate, ordering and performing treatments and interventions, ordering and review of laboratory studies, ordering and review of radiographic studies, pulse oximetry and re-evaluation of patient's condition.   Verma Grothaus  Tacy Learn MD [facility] Pager: [phone] Mobile: [phone]

## 2020-09-04 NOTE — Progress Notes (Signed)
Subjective: Much improved  Exam: Vitals:   09/04/20 1400 09/04/20 1500  BP: 131/74 (!) 156/96  Pulse: 75 81  Resp: (!) 21 19  Temp:    SpO2: 91% 90%   Gen: In bed, NAD Resp: non-labored breathing, no acute distress Abd: soft, nt  Neuro: MS: Awake, alert, knows he is in the hospital but not which one.  He is unable to give me the year.  He follows commands readily, but gets mildly agitated CN: Pupils equal round and reactive, extraocular movements intact, visual fields full Motor: Moves all extremities well Sensory: Intact to light touch  Pertinent Labs: CSF WBC-one CSF RBC-two CSF protein-42 UDS - cocaine + Cr 2.45  PH 7.133  Impression: 65 year old male with seizure in the setting of cocaine positivity, renal failure, severe acidosis.  I agree that this likely represents a provoked seizure.  No indications for antiepileptic therapy at this time.  I saw him shortly after extubation, and so his mild confusion may still be sedation effect.  Recommendations: 1) no AEDs at the current time unless he has further seizures 2) neurology will follow  Roland Rack, MD [facility] [phone]  If 7pm- 7am, please page neurology on call as listed in [place].

## 2020-09-04 NOTE — Progress Notes (Incomplete)
Initial Nutrition Assessment  DOCUMENTATION CODES:      INTERVENTION:  ***    NUTRITION DIAGNOSIS:     related to   as evidenced by  .  ***  GOAL:      ***  MONITOR:      REASON FOR ASSESSMENT:        ASSESSMENT:   65 yo male admitted after being found unresponsive with acute respiratory failure requiring intubation, possible sepsis, metabolic encephalopathy, AKI on CKD. Pt is from homeless shelter. PMH includes DM, HTN, CKD, EtOH/tobacco/heroin abuse   1/19 Admitted, Intubated  Noted hypoglycemic episode overnight  Labs: CBGs 66-118 Meds: ss novolog, MVI with Minerals ***   Diet Order:   Diet Order            Diet NPO time specified  Diet effective now                 EDUCATION NEEDS:      Skin:     Last BM:     Height:   Ht Readings from Last 1 Encounters:  09/03/20 5\' 6"  (1.676 m)    Weight:   Wt Readings from Last 1 Encounters:  09/04/20 89 kg    Ideal Body Weight:     BMI:  Body mass index is 31.67 kg/m.  Estimated Nutritional Needs:   Kcal:     Protein:     Fluid:      Kerman Passey MS, RDN, LDN, CNSC Registered Dietitian III Clinical Nutrition RD Pager and On-Call Pager Number Located in [place]

## 2020-09-05 ENCOUNTER — Inpatient Hospital Stay (HOSPITAL_COMMUNITY): Payer: Medicare HMO

## 2020-09-05 ENCOUNTER — Other Ambulatory Visit (HOSPITAL_COMMUNITY): Payer: Federal, State, Local not specified - PPO

## 2020-09-05 DIAGNOSIS — J9601 Acute respiratory failure with hypoxia: Secondary | ICD-10-CM | POA: Diagnosis not present

## 2020-09-05 DIAGNOSIS — M6282 Rhabdomyolysis: Secondary | ICD-10-CM

## 2020-09-05 DIAGNOSIS — R55 Syncope and collapse: Secondary | ICD-10-CM | POA: Diagnosis not present

## 2020-09-05 DIAGNOSIS — I340 Nonrheumatic mitral (valve) insufficiency: Secondary | ICD-10-CM | POA: Diagnosis not present

## 2020-09-05 DIAGNOSIS — R4189 Other symptoms and signs involving cognitive functions and awareness: Secondary | ICD-10-CM | POA: Diagnosis not present

## 2020-09-05 DIAGNOSIS — I361 Nonrheumatic tricuspid (valve) insufficiency: Secondary | ICD-10-CM

## 2020-09-05 DIAGNOSIS — I4510 Unspecified right bundle-branch block: Secondary | ICD-10-CM

## 2020-09-05 DIAGNOSIS — R7401 Elevation of levels of liver transaminase levels: Secondary | ICD-10-CM | POA: Diagnosis not present

## 2020-09-05 DIAGNOSIS — R778 Other specified abnormalities of plasma proteins: Secondary | ICD-10-CM | POA: Diagnosis not present

## 2020-09-05 DIAGNOSIS — N179 Acute kidney failure, unspecified: Secondary | ICD-10-CM | POA: Diagnosis not present

## 2020-09-05 DIAGNOSIS — I82512 Chronic embolism and thrombosis of left femoral vein: Secondary | ICD-10-CM

## 2020-09-05 LAB — IRON AND TIBC
Iron: 20 ug/dL — ABNORMAL LOW (ref 45–182)
Saturation Ratios: 8 % — ABNORMAL LOW (ref 17.9–39.5)
TIBC: 258 ug/dL (ref 250–450)
UIBC: 238 ug/dL

## 2020-09-05 LAB — C DIFFICILE QUICK SCREEN W PCR REFLEX
C Diff antigen: NEGATIVE
C Diff interpretation: NOT DETECTED
C Diff toxin: NEGATIVE

## 2020-09-05 LAB — GLUCOSE, CAPILLARY
Glucose-Capillary: 70 mg/dL (ref 70–99)
Glucose-Capillary: 76 mg/dL (ref 70–99)
Glucose-Capillary: 77 mg/dL (ref 70–99)
Glucose-Capillary: 80 mg/dL (ref 70–99)
Glucose-Capillary: 80 mg/dL (ref 70–99)
Glucose-Capillary: 96 mg/dL (ref 70–99)

## 2020-09-05 LAB — ECHOCARDIOGRAM LIMITED
Area-P 1/2: 3.03 cm2
Height: 66 in
S' Lateral: 3.3 cm
Weight: 3139.35 oz

## 2020-09-05 LAB — VITAMIN B12: Vitamin B-12: 1455 pg/mL — ABNORMAL HIGH (ref 180–914)

## 2020-09-05 LAB — TSH: TSH: 1.007 u[IU]/mL (ref 0.350–4.500)

## 2020-09-05 LAB — BRAIN NATRIURETIC PEPTIDE: B Natriuretic Peptide: 278 pg/mL — ABNORMAL HIGH (ref 0.0–100.0)

## 2020-09-05 LAB — FERRITIN: Ferritin: 4797 ng/mL — ABNORMAL HIGH (ref 24–336)

## 2020-09-05 LAB — URINE CULTURE: Culture: <10000 — AB

## 2020-09-05 LAB — TROPONIN I (HIGH SENSITIVITY): Troponin I (High Sensitivity): 688 ng/L (ref <18)

## 2020-09-05 LAB — CK: Total CK: 922 U/L — ABNORMAL HIGH (ref 49–397)

## 2020-09-05 MED ORDER — METOPROLOL TARTRATE 25 MG PO TABS
25.0000 mg | ORAL_TABLET | Freq: Two times a day (BID) | ORAL | Status: DC
  Administered 2020-09-05 – 2020-09-29 (×46): 25 mg via ORAL
  Filled 2020-09-05 (×49): qty 1

## 2020-09-05 MED ORDER — AMLODIPINE BESYLATE 5 MG PO TABS
5.0000 mg | ORAL_TABLET | Freq: Every day | ORAL | Status: DC
  Administered 2020-09-05 – 2020-09-29 (×25): 5 mg via ORAL
  Filled 2020-09-05 (×25): qty 1

## 2020-09-05 MED ORDER — SODIUM CHLORIDE 0.9 % IV SOLN: INTRAVENOUS | Status: AC

## 2020-09-05 MED ORDER — DM-GUAIFENESIN ER 30-600 MG PO TB12: 1.0000 | ORAL_TABLET | Freq: Two times a day (BID) | ORAL | Status: DC | PRN

## 2020-09-05 NOTE — TOC Progression Note (Signed)
Transition of Care (TOC) - Progression Note    Patient Details  Name: Todd [name]. MRN: [id number] Date of Birth: November 13, 1955  Transition of [facility] CM/SW Contact  Sharlet Salina Mila Homer, LCSW Phone Number: 09/05/2020, 11:44 AM  Clinical Narrative:  CSW received a secure chat from [place] Jess Barters on 1/20  regarding patient and his living arrangement. Patient has a bed at [facility] and RN was advised by [place], case worker at [facility] that patient just got a bed there and as long as someone at the hospital keeps in touch with them, they will hold the bed for him.  Patient will have to be COVID negative to return and will need a copy of his COVID results to show the [employer]. CSW also consulted with [facility] supervisor and was informed that if for some reason patient cannot return to [facility]. transitions of care may be able to assist patient with temporary lodging.     T[place]ania McDuffy's contact information: [phone], ext. [phone] or her [phone]        Expected Discharge Plan and Services                                               Social Determinants of Health (SDOH) Interventions  Patient is homeless and currently has a bed a [facility]   Readmission Risk Interventions No flowsheet data found.

## 2020-09-05 NOTE — Progress Notes (Signed)
PROGRESS NOTE    Todd [name].  OU:[id number] DOB: 1956-07-05 DOA: 09/03/2020 PCP: [name], MD   Brief Narrative:  65 year old with history of DM2, HTN, CKD, alcohol/tobacco and heroin abuse presented from homeless shelter when he was found down and intubated in the ER.  There was suspicion for seizure-like activity after intubation.  Also found to have COVID-19.  CT head showed bilateral chronic infarct, moderate cerebellar disease but nothing acute.  EEG showed diffuse encephalopathy.  LP was negative for meningitis.  Initially received vancomycin, Zosyn and acyclovir which was later transitioned to Unasyn for aspiration pneumonia.   Assessment & Plan:   Active Problems:   Acute respiratory failure with hypoxia (HCC)  Acute hypoxic respiratory failure secondary to COVID-19 infection and aspiration pneumonia - Extubated 1/20.  Continue bronchodilators - IV Unasyn for aspiration pneumonia - Procalcitonin 3.6 -Will need speech and swallow evaluation - Incentive spirometer, flutter valve -Currently on room air.  Unresponsive concerns of acute meningitis/encephalitis - LP negative.  Vancomycin, Zosyn and acyclovir discontinued.  CSF cultures negative. - CT head- negative for acute changes but does have chronic disease - MRI-pending - EEG-diffuse encephalopathy, no seizures - UDS-negative -Echocardiogram ordered  Elevated Troponins -Chest pain-free.  At baseline EKG has bundle branch block and old infarct?.  Echocardiogram done, results pending.  Troponins elevated.  We will officially consult cardiology for their input  AKI on CKD stage IIIa - Admission creatinine 2.4  Transaminitis, alcohol pattern.  Alcoholic hepatitis? History of hepatitis C - Hep C antibodies positive, check hep C viral load -HIV-negative - Abdominal ultrasound -Monitor for signs of withdrawal  Mild rhabdomyolysis - Continue IV fluids  Macrocytic anemia - Suspect from underlying alcohol  use - Check B12, folate, TSH, Iron studies.   History of esophagitis, ulcerative - Continue PPI  Polysubstance abuse - Will need counseling to quit using this  History of DVT PE LLE, Chronic - Used to be on anticoagulation?Marland Kitchen  Ultrasound Dopplers discussed that shows chronic DVT.  Pharmacy has been consulted for anticoagulation history assistance (tx with Coumadin?). - Has history of laser ablation of his DVT?Marland Kitchen  Followed [facility].  Hx of dilated aorta Seen by CT surgery October 2019.  Discussed blood pressure control and follow-up in 2 years with CTA chest.  This can be done outpatient upon discharge  DVT prophylaxis: Subcu heparin Code Status: Full code Family Communication:    Status is: Inpatient  Remains inpatient appropriate because:IV treatments appropriate due to intensity of illness or inability to take PO   Dispo: The patient is from: Home              Anticipated d/c is to: Home              Anticipated d/c date is: > 3 days              Patient currently is not medically stable to d/c.  Multiple ongoing issues to address as mentioned above including transaminitis, rhabdomyolysis, elevated troponin, aspiration pneumonia.   Body mass index is 31.67 kg/m.     Subjective: Patient seen and examined at bedside, denies any chest pain.  Denies any previous cardiac history.  Review of Systems Otherwise negative except as per HPI, including: General = no fevers, chills, dizziness,  fatigue HEENT/EYES = negative for loss of vision, double vision, blurred vision,  sore throa Cardiovascular= negative for chest pain, palpitation Respiratory/lungs= negative for shortness of breath, cough, wheezing; hemoptysis,  Gastrointestinal= negative for nausea, vomiting, abdominal pain  Genitourinary= negative for Dysuria MSK = Negative for arthralgia, myalgias Neurology= Negative for headache, numbness, tingling  Psychiatry= Negative for suicidal and homocidal ideation Skin= Negative for  Rash   Examination:  General exam: Appears calm and comfortable  Respiratory system: Clear to auscultation. Respiratory effort normal. Cardiovascular system: S1 & S2 heard, RRR. No JVD, murmurs, rubs, gallops or clicks. No pedal edema. Gastrointestinal system: Abdomen is nondistended, soft and nontender. No organomegaly or masses felt. Normal bowel sounds heard. Central nervous system: Alert and oriented. No focal neurological deficits. Extremities: Symmetric 5 x 5 power. Skin: No rashes, lesions or ulcers Psychiatry: Judgement and insight appear normal. Mood & affect appropriate.     Objective: Vitals:   09/04/20 1400 09/04/20 1500 09/04/20 1800 09/05/20 0130  BP: 131/74 (!) 156/96 (!) 151/97 (!) 145/69  Pulse: 75 81 83 80  Resp: (!) 21 19 (!) 21 20  Temp:    (!) 100.4 F (38 C)  TempSrc:    Oral  SpO2: 91% 90% 92% 92%  Weight:      Height:        Intake/Output Summary (Last 24 hours) at 09/05/2020 0727 Last data filed at 09/05/2020 0230 Gross per 24 hour  Intake 1236.73 ml  Output 750 ml  Net 486.73 ml   Filed Weights   09/03/20 1649 09/03/20 2147 09/04/20 0339  Weight: 100 kg 89 kg 89 kg     Data Reviewed:   CBC: Recent Labs  Lab 09/03/20 1623 09/03/20 1803 09/03/20 2009 09/04/20 0348  WBC 8.2  --  6.7 7.4  NEUTROABS 6.4  --   --   --   HGB 12.7* 13.3 11.5* 11.9*  HCT 41.8 39.0 38.5* 37.4*  MCV 105.3*  --  104.3* 100.8*  PLT 208  --  163 [id number]*   Basic Metabolic Panel: Recent Labs  Lab 09/03/20 1623 09/03/20 1803 09/03/20 2009 09/04/20 0348  NA 139 139  --  140  K 5.7* 5.4*  --  4.4  CL 108  --   --  112*  CO2 13*  --   --  17*  GLUCOSE 73  --   --  117*  BUN 21  --   --  26*  CREATININE 2.45*  --   --  2.30*  CALCIUM 8.6*  --   --  7.8*  MG  --   --  1.6* 1.4*  PHOS  --   --  6.3* 3.7   GFR: Estimated Creatinine Clearance: 33.9 mL/min (A) (by C-G formula based on SCr of 2.3 mg/dL (H)). Liver Function Tests: Recent Labs  Lab  09/03/20 1623 09/04/20 0348  AST 502* 974*  ALT 147* 203*  ALKPHOS 97 66  BILITOT 0.8 0.7  PROT 7.0 5.6*  ALBUMIN 3.6 2.9*   No results for input(s): LIPASE, AMYLASE in the last 168 hours. No results for input(s): AMMONIA in the last 168 hours. Coagulation Profile: Recent Labs  Lab 09/03/20 2009  INR 1.4*   Cardiac Enzymes: Recent Labs  Lab 09/03/20 2009 09/04/20 0348  CKTOTAL 367 674*   BNP (last 3 results) No results for input(s): PROBNP in the last 8760 hours. HbA1C: Recent Labs    09/03/20 2009  HGBA1C 5.5   CBG: Recent Labs  Lab 09/04/20 0813 09/04/20 1050 09/04/20 1115 09/04/20 2223 09/05/20 0336  GLUCAP 79 62* 108* 70 80   Lipid Profile: No results for input(s): CHOL, HDL, LDLCALC, TRIG, CHOLHDL, LDLDIRECT in the last 72 hours. Thyroid Function Tests:  No results for input(s): TSH, T4TOTAL, FREET4, T3FREE, THYROIDAB in the last 72 hours. Anemia Panel: No results for input(s): VITAMINB12, FOLATE, FERRITIN, TIBC, IRON, RETICCTPCT in the last 72 hours. Sepsis Labs: Recent Labs  Lab 09/03/20 1705 09/03/20 2009 09/04/20 0348  PROCALCITON  --  3.60  --   LATICACIDVEN 3.0*  --  2.6*    Recent Results (from the past 240 hour(s))  Resp Panel by RT-PCR (Flu A&B, Covid) Nasopharyngeal Swab     Status: Abnormal   Collection Time: 09/03/20  5:05 PM   Specimen: Nasopharyngeal Swab; Nasopharyngeal(NP) swabs in vial transport medium  Result Value Ref Range Status   SARS Coronavirus 2 by RT PCR POSITIVE (A) NEGATIVE Final    Comment: RESULT CALLED TO, READ BACK BY AND VERIFIED WITH: A BANKS RN 1841 09/03/20 A BROWNING (NOTE) SARS-CoV-2 target nucleic acids are DETECTED.  The SARS-CoV-2 RNA is generally detectable in upper respiratory specimens during the acute phase of infection. Positive results are indicative of the presence of the identified virus, but do not rule out bacterial infection or co-infection with other pathogens not detected by the test.  Clinical correlation with patient history and other diagnostic information is necessary to determine patient infection status. The expected result is Negative.  Fact Sheet for Patients: EntrepreneurPulse.com.au  Fact Sheet for Healthcare Providers: IncredibleEmployment.be  This test is not yet approved or cleared by the [place] FDA and  has been authorized for detection and/or diagnosis of SARS-CoV-2 by FDA under an Emergency Use Authorization (EUA).  This EUA will remain in effect (meaning this test can b e used) for the duration of  the COVID-19 declaration under Section 564(b)(1) of the Act, 21 U.S.C. section 360bbb-3(b)(1), unless the authorization is terminated or revoked sooner.     Influenza A by PCR NEGATIVE NEGATIVE Final   Influenza B by PCR NEGATIVE NEGATIVE Final    Comment: (NOTE) The Xpert Xpress SARS-CoV-2/FLU/RSV plus assay is intended as an aid in the diagnosis of influenza from Nasopharyngeal swab specimens and should not be used as a sole basis for treatment. Nasal washings and aspirates are unacceptable for Xpert Xpress SARS-CoV-2/FLU/RSV testing.  Fact Sheet for Patients: EntrepreneurPulse.com.au  Fact Sheet for Healthcare Providers: IncredibleEmployment.be  This test is not yet approved or cleared by the [place] FDA and has been authorized for detection and/or diagnosis of SARS-CoV-2 by FDA under an Emergency Use Authorization (EUA). This EUA will remain in effect (meaning this test can be used) for the duration of the COVID-19 declaration under Section 564(b)(1) of the Act, 21 U.S.C. section 360bbb-3(b)(1), unless the authorization is terminated or revoked.  Performed at [facility], [place] [address], [place], [place] [address]   CSF culture     Status: None (Preliminary result)   Collection Time: 09/03/20  6:11 PM   Specimen: CSF; Cerebrospinal Fluid  Result  Value Ref Range Status   Specimen Description CSF  Final   Special Requests NONE  Final   Gram Stain   Final    WBC PRESENT, PREDOMINANTLY MONONUCLEAR NO ORGANISMS SEEN CYTOSPIN SMEAR    Culture   Final    NO GROWTH < 24 HOURS Performed at [facility], [place] [address] [place],  [address]    Report Status PENDING  Incomplete  Culture, blood (routine x 2)     Status: None (Preliminary result)   Collection Time: 09/03/20  8:12 PM   Specimen: BLOOD RIGHT HAND  Result Value Ref Range Status  Specimen Description BLOOD RIGHT HAND  Final   Special Requests   Final    BOTTLES DRAWN AEROBIC AND ANAEROBIC Blood Culture results may not be optimal due to an inadequate volume of blood received in culture bottles   Culture   Final    NO GROWTH < 12 HOURS Performed at [place] [address], [place], [place] [address]    Report Status PENDING  Incomplete  Culture, blood (routine x 2)     Status: None (Preliminary result)   Collection Time: 09/04/20  3:48 AM   Specimen: BLOOD RIGHT HAND  Result Value Ref Range Status   Specimen Description BLOOD RIGHT HAND  Final   Special Requests   Final    BOTTLES DRAWN AEROBIC ONLY Blood Culture adequate volume Performed at [facility], [place] [address], [place], [place] [address]    Culture PENDING  Incomplete   Report Status PENDING  Incomplete         Radiology Studies: EEG  Result Date: 09/03/2020 Lora Havens, MD     09/03/2020  7:53 PM Patient Name: Tzvi Economou. MRN: [id number] Epilepsy Attending: Lora Havens Referring Physician/Provider: Dr Sherwood Gambler Date: 09/04/2020 Duration: 22.44 mins Patient history: 65yo M with ams. EEG to evaluate for seizure Level of alertness:  comatose AEDs during EEG study: Propofol Technical aspects: This EEG study was done with scalp electrodes positioned according to the 10-20 International system of electrode placement. Electrical activity was acquired at a  sampling rate of 500Hz  and reviewed with a high frequency filter of 70Hz  and a low frequency filter of 1Hz . EEG data were recorded continuously and digitally stored. Description: EEG showed continuous generalized 3 to 6 Hz theta-delta slowing admixed with intermittent 15-18hz  beta activity in frontocentral region.  Hyperventilation and photic stimulation were not performed.   ABNORMALITY -Continuous slow, generalized IMPRESSION: This study is suggestive of severe diffuse encephalopathy, nonspecific etiology but likely related to sedation. No seizures or epileptiform discharges were seen throughout the recording. Lora Havens   CT Head Wo Contrast  Result Date: 09/03/2020 CLINICAL DATA:  Cerebral hemorrhage suspected, mental status change, unknown cause. EXAM: CT HEAD WITHOUT CONTRAST TECHNIQUE: Contiguous axial images were obtained from the base of the skull through the vertex without intravenous contrast. COMPARISON:  Head CT 04/03/2019. FINDINGS: Brain: Mild cerebral atrophy. Subtle ill-defined hypoattenuation within the bilateral thalami (for instance as seen on series 3, image 15). Moderate ill-defined hypoattenuation within the cerebral white matter is nonspecific. There is no acute intracranial hemorrhage. No demarcated cortical infarct. No extra-axial fluid collection. No evidence of intracranial mass. No midline shift. Vascular: No hyperdense vessel.  Atherosclerotic calcifications Skull: Normal. Negative for fracture or focal lesion. Sinuses/Orbits: Visualized orbits show no acute finding. Chronic medially displaced fracture deformity of the left lamina papyracea. Mild bilateral ethmoid and sphenoid sinus mucosal thickening. Small left maxillary sinus mucous retention cyst. Secretions within the bilateral nasal passages. IMPRESSION: Subtle ill-defined hypoattenuation within the bilateral thalami. Findings may reflect progressive chronic small vessel ischemic disease. Subtle changes of  hypoxic/ischemic injury cannot be excluded. Consider brain MRI for further evaluation, as clinically warranted. No acute intracranial hemorrhage or acute demarcated cortical infarction. Moderate cerebral white matter disease, nonspecific but most often secondary to chronic small vessel ischemia. Mild cerebral atrophy Paranasal sinus disease as described. Electronically Signed   By: Kellie Simmering DO   On: 09/03/2020 17:59   DG Chest Port 1 View  Result Date: 09/04/2020 CLINICAL DATA:  Intubation. EXAM: PORTABLE CHEST 1 VIEW COMPARISON:  09/03/2020. FINDINGS: Patient is rotated to the right. Endotracheal tube 3 cm above the carina. NG tube in stable position. Heart size normal. Low lung volumes with bibasilar atelectasis. Bilateral interstitial prominence noted on today's exam. Interstitial edema and or pneumonitis could present this fashion. No prominent pleural effusion. No pneumothorax. IMPRESSION: 1. Endotracheal tube 3 cm above the carina. NG tube in stable position. 2. Low lung volumes with bibasilar atelectasis. Bilateral interstitial prominence noted on today's exam. Interstitial edema and or pneumonitis could present this fashion. Electronically Signed   By: Marcello Moores  Register   On: 09/04/2020 05:32   DG Chest Portable 1 View  Result Date: 09/03/2020 CLINICAL DATA:  Unresponsiveness EXAM: PORTABLE CHEST 1 VIEW COMPARISON:  08/01/2020 FINDINGS: Endotracheal tube terminates 1.8 cm above the carina. Enteric tube courses below the diaphragm with distal tip beyond the inferior margin of the film. Heart size is within normal limits. Slight prominence of the superior mediastinal contours favored secondary to AP portable technique and slight rotation. No focal airspace consolidation, pleural effusion, or pneumothorax. Degenerative changes of the bilateral shoulders. No acute osseous abnormality. IMPRESSION: 1. Endotracheal tube terminates 1.8 cm above the carina. Consider retraction 1-2 cm for more optimal  positioning. 2. No acute cardiopulmonary findings. Electronically Signed   By: Davina Poke D.O.   On: 09/03/2020 16:57   VAS [place] LOWER EXTREMITY VENOUS (DVT)  Result Date: 09/04/2020  Lower Venous DVT Study Indications: Swelling. Other Indications: Covid. Risk Factors: DVT Hx Chronic DVT left. Anticoagulation: Heparin. Comparison Study: Previous 07/14/2018 Chronic DVT left Performing Technologist: Vonzell Schlatter RVT  Examination Guidelines: A complete evaluation includes B-mode imaging, spectral Doppler, color Doppler, and power Doppler as needed of all accessible portions of each vessel. Bilateral testing is considered an integral part of a complete examination. Limited examinations for reoccurring indications may be performed as noted. The reflux portion of the exam is performed with the patient in reverse Trendelenburg.  +-----+---------------+---------+-----------+----------+--------------+ RIGHTCompressibilityPhasicitySpontaneityPropertiesThrombus Aging +-----+---------------+---------+-----------+----------+--------------+ CFV  Full           Yes      Yes                                 +-----+---------------+---------+-----------+----------+--------------+ SFJ  Full                                                        +-----+---------------+---------+-----------+----------+--------------+   +---------+---------------+---------+-----------+----------+--------------+ LEFT     CompressibilityPhasicitySpontaneityPropertiesThrombus Aging +---------+---------------+---------+-----------+----------+--------------+ CFV      Full           Yes      Yes                                 +---------+---------------+---------+-----------+----------+--------------+ SFJ      Full                                                        +---------+---------------+---------+-----------+----------+--------------+ FV Prox  Partial                                                      +---------+---------------+---------+-----------+----------+--------------+  FV Mid   Partial                                                     +---------+---------------+---------+-----------+----------+--------------+ FV DistalPartial                                                     +---------+---------------+---------+-----------+----------+--------------+ PFV      Partial                                                     +---------+---------------+---------+-----------+----------+--------------+ POP      Partial        Yes      Yes                                 +---------+---------------+---------+-----------+----------+--------------+ PTV      Full                                                        +---------+---------------+---------+-----------+----------+--------------+ PERO     Full                                                        +---------+---------------+---------+-----------+----------+--------------+     Summary: RIGHT: - No evidence of common femoral vein obstruction.  LEFT: - Findings consistent with chronic deep vein thrombosis involving the left femoral vein, and left popliteal vein. - No cystic structure found in the popliteal fossa.  *See table(s) above for measurements and observations. Electronically signed by Servando Snare MD on 09/04/2020 at 2:28:05 PM.    Final         Scheduled Meds: . Chlorhexidine Gluconate Cloth  6 each Topical Daily  . docusate  100 mg Per Tube BID  . folic acid  1 mg Per Tube Daily  . heparin  5,000 Units Subcutaneous Q8H  . insulin aspart  0-9 Units Subcutaneous Q4H  . multivitamin with minerals  1 tablet Per Tube Daily  . pantoprazole sodium  40 mg Per Tube QHS  . polyethylene glycol  17 g Per Tube Daily  . thiamine  100 mg Per Tube Daily   Continuous Infusions: . ampicillin-sulbactam (UNASYN) IV 3 g (09/05/20 0126)     LOS: 2 days   Time spent= 35 mins    Aurielle Slingerland Arsenio Loader,  MD [facility]  If 7PM-7AM, please contact night-coverage  09/05/2020, 7:27 AM

## 2020-09-05 NOTE — Consult Note (Addendum)
Cardiology Consultation:   Patient ID: Todd [name]. MRN: [id number]; DOB: 1955/09/07  Admit date: 09/03/2020 Date of Consult: 09/05/2020  Primary Care Provider: [name], MD [facility] Cardiologist: No primary care provider on file. new [place] Electrophysiologist:  None    Patient Profile:   Todd [name]. is a 65 y.o. male with a hx of HTN, DM, RBBB, ETOH/tobacco/heroin abuse, hx multiple PE 06/2018 who is being seen today for the evaluation of elevated troponins at the request of Dr. Reesa Chew.  History of Present Illness:   Mr. Mcminn with above hx was found unresponsive, apneic and foaming at the mouth. Pt responded to narcan with improved mental status but not verbal.  On arrival to ER emergently intubated.   He did have a seizure after Narcan lasted 1 min.  He had BP and pulse when found.  Hx of opiate abuse.   CT head without acute hemorrhage or infarction,  EEG diffuse encephalopathy no seizures. + COVID,  + aspiration PNA, COVID-19 infection.   Pt extubated 09/04/20.  Neuro feels like this was provoked seizure. UDS was neg.  .  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 Telemetry:  Telemetry was personally reviewed and demonstrates:  SR  Echo in 2019 EF 55-60%, G1DD, ascending aortic diameter 40 mm trivial MR LA moderately dilated PA pk pressure 21 mmHg, moderate LVH.   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   CXR no acute findings.  Echo pending  BP 181/89 P 75 R 20 now 98.2 has been 100.4    Past Medical History:  Diagnosis Date   Arthritis    "fingers" (07/14/2018)   Depression    DVT (deep venous thrombosis) (HCC) LLE    Hepatitis C     finished harvoni tx ~ 2017   Hypercholesterolemia    Hypertension    Prostate cancer ([place]) 58yrs ago   Pulmonary embolism and infarction ([place]) 07/13/2018   Sleep apnea    not currently using cpap, mask causing vertigo   Type II diabetes mellitus ([place])     Past Surgical History:  Procedure Laterality Date   BIOPSY  01/12/2019   Procedure: BIOPSY;  Surgeon: Ronald Lobo, MD;  Location: [employer];  Service: Endoscopy;;   BIOPSY  06/23/2019   Procedure: BIOPSY;  Surgeon: Arta Silence, MD;  Location: [place];  Service: Endoscopy;;   COLONOSCOPY WITH PROPOFOL N/A 02/19/2014   Procedure: COLONOSCOPY WITH PROPOFOL;  Surgeon: Garlan Fair, MD;  Location: [facility];  Service: Endoscopy;  Laterality: N/A;   ENDOVENOUS ABLATION SAPHENOUS VEIN W/ LASER Left 11/22/2017   endovenous laser ablation L SSV by Tinnie Gens MD    ESOPHAGEAL BRUSHING  06/23/2019   Procedure: ESOPHAGEAL BRUSHING;  Surgeon: Arta Silence, MD;  Location: [place];  Service: Endoscopy;;   ESOPHAGOGASTRODUODENOSCOPY (EGD) WITH PROPOFOL N/A 01/12/2019   Procedure: ESOPHAGOGASTRODUODENOSCOPY (EGD) WITH PROPOFOL;  Surgeon: Ronald Lobo, MD;  Location: [place];  Service: Endoscopy;  Laterality: N/A;  Patient is also scheduled for barium swallow; please notify radiology after patient's EGD is complete so that barium swallow follows the endoscopy, not  vice versa   ESOPHAGOGASTRODUODENOSCOPY (EGD) WITH PROPOFOL N/A 06/23/2019   Procedure: ESOPHAGOGASTRODUODENOSCOPY (EGD) WITH PROPOFOL;  Surgeon: Arta Silence, MD;  Location: [place];  Service: Endoscopy;  Laterality: N/A;   PROSTATECTOMY  2008   REPAIR QUADRICEPS / HAMSTRING MUSCLE Right      Home Medications:  Prior to Admission medications   Medication Sig Start Date End Date Taking? Authorizing Provider  amLODipine (NORVASC) 10 MG tablet Take 1 tablet (10 mg total) by mouth daily. 06/30/19 07/30/19  [place] Donnamarie Poag, DO  etodolac (LODINE) 300 MG capsule Take 1 capsule (300 mg total) by mouth every 8 (eight) hours. 06/16/20   Dorie Rank, MD  metoprolol tartrate (LOPRESSOR) 25 MG tablet Take 1 tablet (25 mg total) by mouth 2 (two) times daily. 06/30/19 07/30/19  [place] Eric J, DO  pantoprazole (PROTONIX) 40 MG tablet Take 1 tablet (40 mg total) by mouth 2 (two) times daily. 09/26/19 10/26/19  Caccavale, Sophia, PA-C  predniSONE (DELTASONE) 50 MG tablet Take 1 tablet (50 mg total) by mouth daily. 06/16/20   Dorie Rank, MD  sertraline (ZOLOFT) 25 MG tablet Take 1 tablet (25 mg total) by mouth daily. 06/30/19   [place] Eric J, DO  sucralfate (CARAFATE) 1 GM/10ML suspension Take 10 mLs (1 g total) by mouth 4 (four) times daily -  with meals and at bedtime. 09/26/19 10/26/19  Franchot Heidelberg, PA-C    Inpatient Medications: Scheduled Meds:  Chlorhexidine Gluconate Cloth  6 each Topical Daily   docusate  100 mg Per Tube BID   folic acid  1 mg Per Tube Daily   heparin  5,000 Units Subcutaneous Q8H   insulin aspart  0-9 Units Subcutaneous Q4H   multivitamin with minerals  1 tablet Per Tube Daily   pantoprazole sodium  40 mg Per Tube QHS   polyethylene glycol  17 g Per Tube Daily   thiamine  100 mg Per Tube Daily   Continuous Infusions:  sodium chloride 75 mL/hr at 09/05/20 1024   ampicillin-sulbactam (UNASYN) IV 3 g (09/05/20 1024)   PRN Meds: acetaminophen, albuterol, dextromethorphan-guaiFENesin, ondansetron (ZOFRAN) IV, polyethylene glycol  Allergies:   No Known Allergies  Social History:   Social History   Socioeconomic History   Marital status: Single    Spouse name: Not on file   Number of children: 2   Years of education: 12th   Highest education level: Not on file  Occupational History   Occupation: post office  Tobacco Use   Smoking status: Current Every Day Smoker    Packs/day: 0.12    Years: 45.00    Pack years: 5.40    Types: Cigarettes   Smokeless tobacco:  Never Used  Scientific laboratory technician Use: Never used  Substance and Sexual Activity   Alcohol use: Yes   Drug use: Yes   Sexual activity: Not Currently  Other Topics Concern   Not on file  Social History Narrative   ** Merged History Encounter **       Patient lives at home alone.Marland KitchenMarland KitchenDrinks coffee daily    Social Determinants of Radio broadcast assistant Strain: Not on file  Food Insecurity: Not on file  Transportation Needs: Not on file  Physical Activity: Not on file  Stress: Not on file  Social Connections: Not on file  Intimate Partner Violence: Not on file    Family History:    Family History  Problem Relation Age of Onset   Cancer Father  PROSTATE     ROS:  Please see the history of present illness.  General:+COVID 19 + fevers, no weight changes Skin:no rashes or ulcers HEENT:no blurred vision, no congestion CV:see HPI PUL:see HPI GI:no diarrhea constipation or melena, no indigestion GU:no hematuria, no dysuria MS:no joint pain, no claudication Neuro:no syncope, no lightheadedness Endo+ diabetes, no thyroid disease  All other ROS reviewed and negative.     Physical Exam/Data:   Vitals:   09/04/20 1500 09/04/20 1800 09/05/20 0130 09/05/20 1211  BP: (!) 156/96 (!) 151/97 (!) 145/69 (!) 181/89  Pulse: 81 83 80 75  Resp: 19 (!) 21 20 20   Temp:   (!) 100.4 F (38 C) 98.2 F (36.8 C)  TempSrc:   Oral   SpO2: 90% 92% 92% 95%  Weight:      Height:        Intake/Output Summary (Last 24 hours) at 09/05/2020 1315 Last data filed at 09/05/2020 1056 Gross per 24 hour  Intake 836.04 ml  Output 1450 ml  Net -613.96 ml   Last 3 Weights 09/04/2020 09/03/2020 09/03/2020  Weight (lbs) 196 lb 3.4 oz 196 lb 3.4 oz 220 lb 7.4 oz  Weight (kg) 89 kg 89 kg 100 kg  Some encounter information is confidential and restricted. Go to Review Flowsheets activity to see all data.     Body mass index is 31.67 kg/m.  EXAM per Dr. Claiborne Billings    Relevant CV  Studies: Echo 2019   EF 55-60%, G1DD, ascending aortic diameter 40 mm trivial MR LA moderately dilated PA pk pressure 21 mmHg, moderate LVH   Laboratory Data:  High Sensitivity Troponin:   Recent Labs  Lab 09/03/20 1623 09/03/20 2009 09/05/20 0815  TROPONINIHS 396* 650* 688*     Chemistry Recent Labs  Lab 09/03/20 1623 09/03/20 1803 09/04/20 0348  NA 139 139 140  K 5.7* 5.4* 4.4  CL 108  --  112*  CO2 13*  --  17*  GLUCOSE 73  --  117*  BUN 21  --  26*  CREATININE 2.45*  --  2.30*  CALCIUM 8.6*  --  7.8*  GFRNONAA 29*  --  31*  ANIONGAP 18*  --  11    Recent Labs  Lab 09/03/20 1623 09/04/20 0348  PROT 7.0 5.6*  ALBUMIN 3.6 2.9*  AST 502* 974*  ALT 147* 203*  ALKPHOS 97 66  BILITOT 0.8 0.7   Hematology Recent Labs  Lab 09/03/20 1623 09/03/20 1803 09/03/20 2009 09/04/20 0348  WBC 8.2  --  6.7 7.4  RBC 3.97*  --  3.69* 3.71*  HGB 12.7* 13.3 11.5* 11.9*  HCT 41.8 39.0 38.5* 37.4*  MCV 105.3*  --  104.3* 100.8*  MCH 32.0  --  31.2 32.1  MCHC 30.4  --  29.9* 31.8  RDW 16.0*  --  15.9* 15.9*  PLT 208  --  163 135*   BNP Recent Labs  Lab 09/05/20 0815  BNP 278.0*    DDimer No results for input(s): DDIMER in the last 168 hours.   Radiology/Studies:  EEG  Result Date: 09/03/2020 Lora Havens, MD     09/03/2020  7:53 PM Patient Name: Kyrese Rettke. MRN: [id number] Epilepsy Attending: Lora Havens Referring Physician/Provider: Dr Sherwood Gambler Date: 09/04/2020 Duration: 22.44 mins Patient history: 65yo M with ams. EEG to evaluate for seizure Level of alertness:  comatose AEDs during EEG study: Propofol Technical aspects: This EEG study was done with scalp electrodes positioned according  to the 10-20 International system of electrode placement. Electrical activity was acquired at a sampling rate of 500Hz  and reviewed with a high frequency filter of 70Hz  and a low frequency filter of 1Hz . EEG data were recorded continuously and digitally stored.  Description: EEG showed continuous generalized 3 to 6 Hz theta-delta slowing admixed with intermittent 15-18hz  beta activity in frontocentral region.  Hyperventilation and photic stimulation were not performed.   ABNORMALITY -Continuous slow, generalized IMPRESSION: This study is suggestive of severe diffuse encephalopathy, nonspecific etiology but likely related to sedation. No seizures or epileptiform discharges were seen throughout the recording. Lora Havens   CT Head Wo Contrast  Result Date: 09/03/2020 CLINICAL DATA:  Cerebral hemorrhage suspected, mental status change, unknown cause. EXAM: CT HEAD WITHOUT CONTRAST TECHNIQUE: Contiguous axial images were obtained from the base of the skull through the vertex without intravenous contrast. COMPARISON:  Head CT 04/03/2019. FINDINGS: Brain: Mild cerebral atrophy. Subtle ill-defined hypoattenuation within the bilateral thalami (for instance as seen on series 3, image 15). Moderate ill-defined hypoattenuation within the cerebral white matter is nonspecific. There is no acute intracranial hemorrhage. No demarcated cortical infarct. No extra-axial fluid collection. No evidence of intracranial mass. No midline shift. Vascular: No hyperdense vessel.  Atherosclerotic calcifications Skull: Normal. Negative for fracture or focal lesion. Sinuses/Orbits: Visualized orbits show no acute finding. Chronic medially displaced fracture deformity of the left lamina papyracea. Mild bilateral ethmoid and sphenoid sinus mucosal thickening. Small left maxillary sinus mucous retention cyst. Secretions within the bilateral nasal passages. IMPRESSION: Subtle ill-defined hypoattenuation within the bilateral thalami. Findings may reflect progressive chronic small vessel ischemic disease. Subtle changes of hypoxic/ischemic injury cannot be excluded. Consider brain MRI for further evaluation, as clinically warranted. No acute intracranial hemorrhage or acute demarcated cortical  infarction. Moderate cerebral white matter disease, nonspecific but most often secondary to chronic small vessel ischemia. Mild cerebral atrophy Paranasal sinus disease as described. Electronically Signed   By: Kellie Simmering DO   On: 09/03/2020 17:59   DG Chest Port 1 View  Result Date: 09/04/2020 CLINICAL DATA:  Intubation. EXAM: PORTABLE CHEST 1 VIEW COMPARISON:  09/03/2020. FINDINGS: Patient is rotated to the right. Endotracheal tube 3 cm above the carina. NG tube in stable position. Heart size normal. Low lung volumes with bibasilar atelectasis. Bilateral interstitial prominence noted on today's exam. Interstitial edema and or pneumonitis could present this fashion. No prominent pleural effusion. No pneumothorax. IMPRESSION: 1. Endotracheal tube 3 cm above the carina. NG tube in stable position. 2. Low lung volumes with bibasilar atelectasis. Bilateral interstitial prominence noted on today's exam. Interstitial edema and or pneumonitis could present this fashion. Electronically Signed   By: Marcello Moores  Register   On: 09/04/2020 05:32   DG Chest Portable 1 View  Result Date: 09/03/2020 CLINICAL DATA:  Unresponsiveness EXAM: PORTABLE CHEST 1 VIEW COMPARISON:  08/01/2020 FINDINGS: Endotracheal tube terminates 1.8 cm above the carina. Enteric tube courses below the diaphragm with distal tip beyond the inferior margin of the film. Heart size is within normal limits. Slight prominence of the superior mediastinal contours favored secondary to AP portable technique and slight rotation. No focal airspace consolidation, pleural effusion, or pneumothorax. Degenerative changes of the bilateral shoulders. No acute osseous abnormality. IMPRESSION: 1. Endotracheal tube terminates 1.8 cm above the carina. Consider retraction 1-2 cm for more optimal positioning. 2. No acute cardiopulmonary findings. Electronically Signed   By: Davina Poke D.O.   On: 09/03/2020 16:57   VAS [place] LOWER EXTREMITY VENOUS (DVT)  Result  Date:  09/04/2020  Lower Venous DVT Study Indications: Swelling. Other Indications: Covid. Risk Factors: DVT Hx Chronic DVT left. Anticoagulation: Heparin. Comparison Study: Previous 07/14/2018 Chronic DVT left Performing Technologist: Vonzell Schlatter RVT  Examination Guidelines: A complete evaluation includes B-mode imaging, spectral Doppler, color Doppler, and power Doppler as needed of all accessible portions of each vessel. Bilateral testing is considered an integral part of a complete examination. Limited examinations for reoccurring indications may be performed as noted. The reflux portion of the exam is performed with the patient in reverse Trendelenburg.  +-----+---------------+---------+-----------+----------+--------------+  RIGHT Compressibility Phasicity Spontaneity Properties Thrombus Aging  +-----+---------------+---------+-----------+----------+--------------+  CFV   Full            Yes       Yes                                    +-----+---------------+---------+-----------+----------+--------------+  SFJ   Full                                                             +-----+---------------+---------+-----------+----------+--------------+   +---------+---------------+---------+-----------+----------+--------------+  LEFT      Compressibility Phasicity Spontaneity Properties Thrombus Aging  +---------+---------------+---------+-----------+----------+--------------+  CFV       Full            Yes       Yes                                    +---------+---------------+---------+-----------+----------+--------------+  SFJ       Full                                                             +---------+---------------+---------+-----------+----------+--------------+  FV Prox   Partial                                                          +---------+---------------+---------+-----------+----------+--------------+  FV Mid    Partial                                                           +---------+---------------+---------+-----------+----------+--------------+  FV Distal Partial                                                          +---------+---------------+---------+-----------+----------+--------------+  PFV       Partial                                                          +---------+---------------+---------+-----------+----------+--------------+  POP       Partial         Yes       Yes                                    +---------+---------------+---------+-----------+----------+--------------+  PTV       Full                                                             +---------+---------------+---------+-----------+----------+--------------+  PERO      Full                                                             +---------+---------------+---------+-----------+----------+--------------+     Summary: RIGHT: - No evidence of common femoral vein obstruction.  LEFT: - Findings consistent with chronic deep vein thrombosis involving the left femoral vein, and left popliteal vein. - No cystic structure found in the popliteal fossa.  *See table(s) above for measurements and observations. Electronically signed by Lemar LivingsBrandon Cain MD on 09/04/2020 at 2:28:05 PM.    Final    US Abdomen Limited RUQ (LIVER/GB)  Result Date: 09/05/2020 CLINICAL DATA:  Transaminitis Hepatitis C Hypertension Type 2 diabetes EXAM: ULTRASOUND ABDOMEN LIMITED RIGHT UPPER QUADRANT COMPARISON:  CT abdomen pelvis 06/19/2019 FINDINGS: Gallbladder: Surgically absent Common bile duct: Diameter: 2 mm Liver: No focal lesion identified. Within normal limits in parenchymal echogenicity. Portal vein is patent on color Doppler imaging with normal direction of blood flow towards the liver. Other: None. IMPRESSION: No significant abnormality of the liver. Electronically Signed   By: Acquanetta BellingFarhaan  Mir M.D.   On: 09/05/2020 11:14     Assessment and Plan:   1. Elevated troponin to pk 688, mostly flat in pt found unresponsive with  pulse and BP. Unknown how long unresponsive, no drugs on drug screen. May be demand ischemia.  Hx of PE in 2019. 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   Echo pending - if abnormal may need to rethink plan but for now may need cardiac CTA once recovered or if still with elevated Cr then lexiscan myoview.  He is at risk for CAD with diabetes and HTN.  He is homeless so may be difficult to arrange outpt testing.   No chest pain.  He has order for CTA of chest - or VQ scan.  may be helpful with his hx of PE.  2. AKI with elevated Cr on admit (cr in 09/2019 was 1.51 to 1.21)  3. + COVID -19 low grade fever per IM 4. DM per IM 5. HTN elevated today no meds - if correct was on amlodipine 10 and lopressor 25 BID.  6. Aspiration PNA + COVID with hypoxic respiratory failure- extubated 1/20 on IV unasyn now on RA per IM  7. Seizure and unresponsive, LP neg. CSF cultures negative. EEG neg  8. Transaminitis  Per IM 9. Mild rhabdomyolysis rec'd IV fluids  10. 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 Agree with assessment and plan.  Mr. Romanos is a 65 year old African-American male who has a history of hypertension, diabetes mellitus, chronic right bundle branch block, tobacco, EtOH and heroin use who remotely had suffered pulmonary emboli in November 2019.  He was recently found unresponsive and apneic with foaming at the mouth which responded to Narcan September 03, 2020 and had a seizure after Narcan lasting approximately 1 minute.Marland Kitchen  He was initially intubated and subsequently extubated on September 04, 2020.  Patient currently is alert but not talkative.  He has a history of hypertension.  A prior echo Doppler study in 2019 showed normal LV  function with grade 1 diastolic dysfunction and moderate LVH with mild dilation of ascending aorta.  Venous Dopplers have shown chronic DVT of the left femoral vein and popliteal vein.  Presently, he denies chest pain.  He is hypertensive blood pressure at 181/89.  JVD was 7 mm.  He had scattered rhonchi without wheezing on lung exam.  Rhythm was regular with rates around 80 faint systolic murmur.  There was no S3 gallop.  Abdomen was soft nontender.  He did not have significant edema.  He had a flat affect.  ECG shows sinus rhythm with chronic right bundle branch block without ischemic ST-T abnormalities.  Initial laboratory was notable for CPK consistent with rhabdomyolysis.  BNP was mildly increased at 278 consistent with probable component of diastolic heart failure.  Transaminases are elevated and was hypomagnesemic medium of 1.4 with IV magnesium.  There is acute kidney injury most likely in the setting of rhabdomyolysis with a creatinine on admission at 2.45, improved to 2.30.  High-sensitivity troponin levels are increased but with a flat plateau with 650 increasing to 674 and 688.  He, he denies chest pain.  I do not believe troponins are due to acute coronary syndrome and most likely represent demand ischemia.  Presently, with his significant hypertension recommend reinstitution of amlodipine at 10 mg and will initiate metoprolol initially at 25 mg twice a day and titrate as needed.  We will recheck magnesium level.  Preliminary review of the echo that was just completed by me but not yet been formally read suggests LVH with fairly normal LV systolic function.  His PE history and chronic DVT, consider VQ scan for PE.  At present acute kidney injury precludes contrast use with chest CT.  Continue IV fluids with rhabdomyolysis.  We will have rounding team follow-up in a.m. for further recommendations.   Troy Sine, MD, [facility] 09/05/2020 3:01 PM

## 2020-09-05 NOTE — Evaluation (Signed)
Clinical/Bedside Swallow Evaluation Patient Details  Name: Todd [name]. MRN: [id number] Date of Birth: 11/13/1955  Today's Date: 09/05/2020 Time: SLP Start Time (ACUTE ONLY): 1148 SLP Stop Time (ACUTE ONLY): 1206 SLP Time Calculation (min) (ACUTE ONLY): 18 min  Past Medical History:  Past Medical History:  Diagnosis Date  . Arthritis    "fingers" (07/14/2018)  . Depression   . DVT (deep venous thrombosis) ([facility]) LLE  . Hepatitis C     finished harvoni tx ~ 2017  . Hypercholesterolemia   . Hypertension   . Prostate cancer ([place]) 7yrs ago  . Pulmonary embolism and infarction ([place]) 07/13/2018  . Sleep apnea    not currently using cpap, mask causing vertigo  . Type II diabetes mellitus ([place])    Past Surgical History:  Past Surgical History:  Procedure Laterality Date  . BIOPSY  01/12/2019   Procedure: BIOPSY;  Surgeon: Ronald Lobo, MD;  Location: [facility] ENDOSCOPY;  Service: Endoscopy;;  . BIOPSY  06/23/2019   Procedure: BIOPSY;  Surgeon: Arta Silence, MD;  Location: [place];  Service: Endoscopy;;  . COLONOSCOPY WITH PROPOFOL N/A 02/19/2014   Procedure: COLONOSCOPY WITH PROPOFOL;  Surgeon: Garlan Fair, MD;  Location: [facility] ENDOSCOPY;  Service: Endoscopy;  Laterality: N/A;  . ENDOVENOUS ABLATION SAPHENOUS VEIN W/ LASER Left 11/22/2017   endovenous laser ablation L SSV by Tinnie Gens MD   . ESOPHAGEAL BRUSHING  06/23/2019   Procedure: ESOPHAGEAL BRUSHING;  Surgeon: Arta Silence, MD;  Location: [place];  Service: Endoscopy;;  . ESOPHAGOGASTRODUODENOSCOPY (EGD) WITH PROPOFOL N/A 01/12/2019   Procedure: ESOPHAGOGASTRODUODENOSCOPY (EGD) WITH PROPOFOL;  Surgeon: Ronald Lobo, MD;  Location: [place];  Service: Endoscopy;  Laterality: N/A;  Patient is also scheduled for barium swallow; please notify radiology after patient's EGD is complete so that barium swallow follows the endoscopy, not vice versa  . ESOPHAGOGASTRODUODENOSCOPY (EGD) WITH PROPOFOL N/A 06/23/2019    Procedure: ESOPHAGOGASTRODUODENOSCOPY (EGD) WITH PROPOFOL;  Surgeon: Arta Silence, MD;  Location: [place];  Service: Endoscopy;  Laterality: N/A;  . PROSTATECTOMY  2008  . REPAIR QUADRICEPS / HAMSTRING MUSCLE Right    HPI:  Pt is a 65 year old with hx of DM, HTN, CKD, EtOH/tobacco/heroin abuse presenting from homeless shelter after being found down foaming at mouthHe was intubated in the ED on 1//19 and extubated 1/20. CT head: Subtle ill-defined hypoattenuation within the bilateral thalami. Findings may reflect progressive chronic small vessel ischemic disease. CXR 1/20: Low lung volumes with bibasilar atelectasis. Bilateral interstitial prominence noted on today's exam. Interstitial edema and or pneumonitis could present like this.   Assessment / Plan / Recommendation Clinical Impression  Pt was seen for bedside swallow evaluation and he denied a history of oropharyngeal dysphagia. However, he reported he had an "erupting esophagus" last month that was treated with grits and eggs. Oral mechanism exam was [facility]. Dentition was limited with only mandibular incisors. Pt stated that he owns dentures, but that they are not at the hospital at this time. He tolerated all solids and liquids without signs or symptoms of oropharyngeal dysphagia, but refused regular texture due to anticipated difficulty with mastication. A dysphagia 3 diet with thin liquids is recommended at this time and further skilled SLP services are not clinically indicated for swallowing. SLP Visit Diagnosis: Dysphagia, unspecified (R13.10)    Aspiration Risk  No limitations    Diet Recommendation Dysphagia 3 (Mech soft);Thin liquid   Liquid Administration via: Cup;Straw Medication Administration: Whole meds with liquid Supervision: Staff to assist with self  feeding Postural Changes: Seated upright at 90 degrees    Other  Recommendations Oral Care Recommendations: Oral care BID   Follow up Recommendations None       Frequency and Duration            Prognosis        Swallow Study   General Date of Onset: 09/04/20 HPI: Pt is a 65 year old with hx of DM, HTN, CKD, EtOH/tobacco/heroin abuse presenting from homeless shelter after being found down foaming at mouthHe was intubated in the ED on 1//19 and extubated 1/20. CT head: Subtle ill-defined hypoattenuation within the bilateral thalami. Findings may reflect progressive chronic small vessel ischemic disease. CXR 1/20: Low lung volumes with bibasilar atelectasis. Bilateral interstitial prominence noted on today's exam. Interstitial edema and or pneumonitis could present like this. Type of Study: Bedside Swallow Evaluation Previous Swallow Assessment: None Diet Prior to this Study: Thin liquids (clear liquids) Temperature Spikes Noted: No Respiratory Status: Room air History of Recent Intubation: No Behavior/Cognition: Alert;Pleasant mood;Cooperative Oral Cavity Assessment: Within Functional Limits Oral Care Completed by SLP: No Oral Cavity - Dentition: Dentures, not available (Pt presented with limited mandibular teeth without molar. He reported that he owns dentures which are not at the hospital) Vision: Functional for self-feeding Self-Feeding Abilities: Needs assist Patient Positioning: Upright in bed;Postural control adequate for testing Baseline Vocal Quality: Normal Volitional Cough: Strong Volitional Swallow: Able to elicit    Oral/Motor/Sensory Function Overall Oral Motor/Sensory Function: Within functional limits   Ice Chips Ice chips: Within functional limits Presentation: Spoon   Thin Liquid Thin Liquid: Within functional limits Presentation: Straw    Nectar Thick Nectar Thick Liquid: Not tested   Honey Thick Honey Thick Liquid: Not tested   Puree Puree: Within functional limits Presentation: Spoon   Solid     Solid: Within functional limits     Nikiah Goin I. Hardin Negus, [place], [place] Office number  [phone] Pager [phone]  Horton Marshall 09/05/2020,1:58 PM

## 2020-09-06 DIAGNOSIS — J9601 Acute respiratory failure with hypoxia: Secondary | ICD-10-CM | POA: Diagnosis not present

## 2020-09-06 DIAGNOSIS — R7401 Elevation of levels of liver transaminase levels: Secondary | ICD-10-CM | POA: Diagnosis not present

## 2020-09-06 DIAGNOSIS — M6282 Rhabdomyolysis: Secondary | ICD-10-CM | POA: Diagnosis not present

## 2020-09-06 DIAGNOSIS — N179 Acute kidney failure, unspecified: Secondary | ICD-10-CM | POA: Diagnosis not present

## 2020-09-06 DIAGNOSIS — I4510 Unspecified right bundle-branch block: Secondary | ICD-10-CM | POA: Diagnosis not present

## 2020-09-06 DIAGNOSIS — R778 Other specified abnormalities of plasma proteins: Secondary | ICD-10-CM | POA: Diagnosis not present

## 2020-09-06 LAB — CBC
HCT: 35.9 % — ABNORMAL LOW (ref 39.0–52.0)
Hemoglobin: 12.2 g/dL — ABNORMAL LOW (ref 13.0–17.0)
MCH: 31.7 pg (ref 26.0–34.0)
MCHC: 34 g/dL (ref 30.0–36.0)
MCV: 93.2 fL (ref 80.0–100.0)
Platelets: 100 10*3/uL — ABNORMAL LOW (ref 150–400)
RBC: 3.85 MIL/uL — ABNORMAL LOW (ref 4.22–5.81)
RDW: 15.4 % (ref 11.5–15.5)
WBC: 5 10*3/uL (ref 4.0–10.5)
nRBC: 0 % (ref 0.0–0.2)

## 2020-09-06 LAB — TROPONIN I (HIGH SENSITIVITY): Troponin I (High Sensitivity): 268 ng/L (ref <18)

## 2020-09-06 LAB — GLUCOSE, CAPILLARY
Glucose-Capillary: 126 mg/dL — ABNORMAL HIGH (ref 70–99)
Glucose-Capillary: 160 mg/dL — ABNORMAL HIGH (ref 70–99)
Glucose-Capillary: 116 mg/dL — ABNORMAL HIGH (ref 70–99)
Glucose-Capillary: 148 mg/dL — ABNORMAL HIGH (ref 70–99)
Glucose-Capillary: 113 mg/dL — ABNORMAL HIGH (ref 70–99)

## 2020-09-06 LAB — PROTIME-INR
INR: 1.2 (ref 0.8–1.2)
Prothrombin Time: 14.7 seconds (ref 11.4–15.2)

## 2020-09-06 LAB — COMPREHENSIVE METABOLIC PANEL
ALT: 2465 U/L — ABNORMAL HIGH (ref 0–44)
AST: 8396 U/L — ABNORMAL HIGH (ref 15–41)
Albumin: 2.9 g/dL — ABNORMAL LOW (ref 3.5–5.0)
Alkaline Phosphatase: 69 U/L (ref 38–126)
Anion gap: 12 (ref 5–15)
BUN: 16 mg/dL (ref 8–23)
CO2: 19 mmol/L — ABNORMAL LOW (ref 22–32)
Calcium: 8 mg/dL — ABNORMAL LOW (ref 8.9–10.3)
Chloride: 109 mmol/L (ref 98–111)
Creatinine, Ser: 2.02 mg/dL — ABNORMAL HIGH (ref 0.61–1.24)
GFR, Estimated: 36 mL/min — ABNORMAL LOW (ref >60)
Glucose, Bld: 91 mg/dL (ref 70–99)
Potassium: 3 mmol/L — ABNORMAL LOW (ref 3.5–5.1)
Sodium: 140 mmol/L (ref 135–145)
Total Bilirubin: 1.2 mg/dL (ref 0.3–1.2)
Total Protein: 6.2 g/dL — ABNORMAL LOW (ref 6.5–8.1)

## 2020-09-06 LAB — HSV DNA BY PCR (REFERENCE LAB)
HSV 1 DNA: NEGATIVE
HSV 2 DNA: NEGATIVE

## 2020-09-06 LAB — HCV RNA QUANT: HCV Quantitative: NOT DETECTED IU/mL (ref >50)

## 2020-09-06 LAB — CK: Total CK: 725 U/L — ABNORMAL HIGH (ref 49–397)

## 2020-09-06 LAB — MAGNESIUM: Magnesium: 1.7 mg/dL (ref 1.7–2.4)

## 2020-09-06 MED ORDER — ASPIRIN EC 81 MG PO TBEC
81.0000 mg | DELAYED_RELEASE_TABLET | Freq: Every day | ORAL | Status: DC
Start: 2020-09-06 — End: 2020-09-30
  Administered 2020-09-06 – 2020-09-29 (×24): 81 mg via ORAL
  Filled 2020-09-06 (×23): qty 1

## 2020-09-06 MED ORDER — POTASSIUM CHLORIDE CRYS ER 20 MEQ PO TBCR
40.0000 meq | EXTENDED_RELEASE_TABLET | ORAL | Status: AC
  Administered 2020-09-06 (×3): 40 meq via ORAL
  Filled 2020-09-06 (×3): qty 2

## 2020-09-06 MED ORDER — MAGNESIUM SULFATE 4 GM/100ML IV SOLN
4.0000 g | Freq: Once | INTRAVENOUS | Status: AC
  Administered 2020-09-06: 4 g via INTRAVENOUS
  Filled 2020-09-06: qty 100

## 2020-09-06 NOTE — Progress Notes (Signed)
     Dr Evette Georges rounding note reviewed, patient seen peripherally.   Patient Profile     Todd [name]. is a 65 y.o. male with a hx of HTN, DM, RBBB, ETOH/tobacco/heroin abuse, hx multiple PE 06/2018 who is being seen today for the evaluation of elevated troponins at the request of Dr. Reesa Chew.  Assessment & Plan    1. Elevated troponin - trop to  688, repeat to clarify peak - EKG without acute ischemic changes - echo LVEF 60-65%, no WMAs, Grade I diast dysfunc,  - elevated trop in setting of COVID 19 and aspiration pneumonia initially intubated now extubated, rhabdo, AKI  - very likely demand ischemia, no plans for inpatient ischemic testing in COVID + patient with ongoing issues with altered mental status, normal echo and EKG without ischemic changes. Can reconsider ischemic testing as outpatient pending symptoms at that time  We will sign off inpatient care. Started on ASA 81mg  daily, if anticoag started for PE history can stop ASA.    For questions or updates, please contact [place] Please consult [email or web address] for contact info under        Signed, Carlyle Dolly, MD  09/06/2020, 9:57 AM

## 2020-09-06 NOTE — Progress Notes (Signed)
Neurology Progress Note  S: Patient states he is feeling better. Denies any seizure like activity. Per RN, no seizure like activity overnight.   O: Current vital signs: BP 134/84 (BP Location: Left Arm)   Pulse 66   Temp 98.9 F (37.2 C)   Resp 16   Ht 5\' 6"  (1.676 m)   Wt 83.8 kg   SpO2 94%   BMI 29.82 kg/m  Vital signs in last 24 hours: Temp:  [98.2 F (36.8 C)-100.7 F (38.2 C)] 98.9 F (37.2 C) (01/22 0447) Pulse Rate:  [66-75] 66 (01/22 0447) Resp:  [16-20] 16 (01/22 0447) BP: (134-181)/(84-89) 134/84 (01/22 0447) SpO2:  [94 %-96 %] 94 % (01/22 0447) Weight:  [83.8 kg] 83.8 kg (01/22 0500)  GENERAL: Awake, alert in NAD HEENT: Normocephalic and atraumatic, dry mm LUNGS: Normal respiratory effort.  CV: RRR ABDOMEN: Soft, nontender Ext: warm  NEURO:  Mental Status: AA&Ox3  Speech/Language: speech is without dysarthria.  Naming, repetition, fluency, and comprehension intact.  Cranial Nerves:  II: PERRL. Visual fields full.  III, IV, VI: EOMI. Eyelids elevate symmetrically.  V: Sensation is intact to light touch and symmetrical to face.  VII: Smile is symmetrical. Able to puff cheeks and raise eyebrows.  VIII: hearing intact to voice. IX, X: Palate elevates symmetrically. Phonation is normal.  SA:[id number] shrug 5/5. XII: tongue is midline without fasciculations. Motor: 5/5 strength to all muscle groups tested.  Tone: is normal and bulk is normal Sensation- Intact to light touch bilaterally. Extinction intact.    Coordination: No drift. No tremors. No clonus. Gait- deferred  Medications  Current Facility-Administered Medications:  .  acetaminophen (TYLENOL) tablet 650 mg, 650 mg, Per Tube, Q4H PRN, Candee Furbish, MD, 650 mg at 09/05/20 2147 .  albuterol (PROVENTIL) (2.5 MG/3ML) 0.083% nebulizer solution 2.5 mg, 2.5 mg, Nebulization, Q2H PRN, Bowser, Grace E, NP .  amLODipine (NORVASC) tablet 5 mg, 5 mg, Oral, Daily, Cecilie Kicks R, NP, 5 mg at 09/05/20  1600 .  Ampicillin-Sulbactam (UNASYN) 3 g in sodium chloride 0.9 % 100 mL IVPB, 3 g, Intravenous, Q8H, Chand, Sudham, MD, Last Rate: 200 mL/hr at 09/06/20 0240, 3 g at 09/06/20 0240 .  Chlorhexidine Gluconate Cloth 2 % PADS 6 each, 6 each, Topical, Daily, Candee Furbish, MD, 6 each at 09/04/20 2100 .  dextromethorphan-guaiFENesin (MUCINEX DM) 30-600 MG per 12 hr tablet 1 tablet, 1 tablet, Oral, BID PRN, Amin, Ankit Chirag, MD .  docusate (COLACE) 50 MG/5ML liquid 100 mg, 100 mg, Per Tube, BID, Bowser, Laurel Dimmer, NP, 100 mg at 09/05/20 2146 .  folic acid (FOLVITE) tablet 1 mg, 1 mg, Per Tube, Daily, Candee Furbish, MD, 1 mg at 09/05/20 1031 .  heparin injection 5,000 Units, 5,000 Units, Subcutaneous, Q8H, Bowser, Laurel Dimmer, NP, 5,000 Units at 09/06/20 0511 .  insulin aspart (novoLOG) injection 0-9 Units, 0-9 Units, Subcutaneous, Q4H, Bowser, Laurel Dimmer, NP, 1 Units at 09/06/20 0444 .  magnesium sulfate IVPB 4 g 100 mL, 4 g, Intravenous, Once, Amin, Ankit Chirag, MD .  metoprolol tartrate (LOPRESSOR) tablet 25 mg, 25 mg, Oral, BID, Isaiah Serge, NP, 25 mg at 09/05/20 2146 .  multivitamin with minerals tablet 1 tablet, 1 tablet, Per Tube, Daily, Candee Furbish, MD, 1 tablet at 09/05/20 1031 .  ondansetron (ZOFRAN) injection 4 mg, 4 mg, Intravenous, Q6H PRN, Bowser, Grace E, NP .  pantoprazole sodium (PROTONIX) 40 mg/20 mL oral suspension 40 mg, 40 mg, Per Tube, QHS, Chand, Sudham,  MD, 40 mg at 09/05/20 2146 .  polyethylene glycol (MIRALAX / GLYCOLAX) packet 17 g, 17 g, Per Tube, Daily, Bowser, Grace E, NP, 17 g at 09/04/20 1032 .  polyethylene glycol (MIRALAX / GLYCOLAX) packet 17 g, 17 g, Per Tube, Daily PRN, Candee Furbish, MD .  thiamine tablet 100 mg, 100 mg, Per Tube, Daily, Candee Furbish, MD, 100 mg at 09/05/20 1033  CT Head showed Subtle ill-defined hypoattenuation within the bilateral thalami. Findings may reflect progressive chronic small vessel ischemic disease. Subtle changes of  hypoxic/ischemic injury cannot be excluded. Consider brain MRI for further evaluation, as clinically warranted. No acute intracranial hemorrhage or acute demarcated cortical infarction. Moderate cerebral white matter disease, nonspecific but most often secondary to chronic small vessel ischemia. Mild cerebral atrophy  EEG This study is suggestive of severe diffuse encephalopathy, nonspecific etiology but likely related to sedation. No seizures or epileptiform discharges were seen throughout the recording.   Assessment: 65 yo male with a hx of substance abuse including heroin, ETOH, and cocaine who was admitted on 09/03/20 after being found down in a homeless shelter. He responded to Narcan. He had a seizure at the scene. He was opioid neg on UDS, but received Narcan in the field. Since admission, he has had no further seizure like activity and it is thought that this seizure activity was provoked by illicit drug use, AKI, and severe acidosis. Today, on exam, he is easily arousable, calm, and oriented.   Impression:  1. Provoked seizure in the setting of polysubstance abuse with negative EEG for seizures or epileptiform discharges.  2. COVID +  Recommendations: -Suspect MRI brain will be low yield and not not recommend unless patient deteriorates. -No AEDs unless further seizure activity is noted -substance abuse counseling -continue seizure precautions  Neuro will be available for questions as needed.   -Proper PPE was worn during patient visit, including, N95, hair bonnet, gown, gloves, and shoe covers.    Pt seen by Clance Boll, MSN, APN-BC/Nurse Practitioner/Neuro and plan discussed with MD who agrees.  Pager: [phone]

## 2020-09-06 NOTE — Progress Notes (Signed)
PROGRESS NOTE    Todd [name].  NKN:[id number] DOB: [birth year] DOA: 09/03/2020 PCP: [name], MD   Brief Narrative:  65 year old with history of DM2, HTN, CKD, alcohol/tobacco and heroin abuse presented from homeless shelter when he was found down and intubated in the ER.  There was suspicion for seizure-like activity after intubation.  Also found to have COVID-19.  CT head showed bilateral chronic infarct, moderate cerebellar disease but nothing acute.  EEG showed diffuse encephalopathy.  LP was negative for meningitis.  Initially received vancomycin, Zosyn and acyclovir which was later transitioned to Unasyn for aspiration pneumonia.  Seen by cardiology due to elevated troponin.   Assessment & Plan:   Active Problems:   Acute respiratory failure with hypoxia (HCC)  Acute hypoxic respiratory failure secondary to COVID-19 infection and aspiration pneumonia - Currently on room air.  Extubated 1/20.  Continue bronchodilators - IV Unasyn for aspiration pneumonia.  Tentative last day 1/26 - Procalcitonin 3.6 -Speech and swallow-dysphagia 3 diet - Incentive spirometer, flutter valve  Unresponsive concerns of acute meningitis/encephalitis - LP negative.  Vancomycin, Zosyn and acyclovir discontinued.  CSF cultures negative. - CT head- negative for acute changes but does have chronic disease - MRI-pending - EEG-diffuse encephalopathy, no seizures - UDS-negative -Echocardiogram -echocardiogram EF 65%, grade 1 DD  Elevated Troponins -Chest pain-free.  EKG at baseline shows bundle branch block.  Echocardiogram EF 65%, grade 1 DD.  Troponins elevated but flat.  Cardiology following  AKI on CKD stage IIIa - Admission creatinine 2.4.  Improving.   Transaminitis, alcohol pattern.  Worsening History of hepatitis C - Hep C antibodies positive, hep C viral load-pending Concerns for Shock liver. Will consult GI ( seen by [facility] in the past). Repeat Coags.  -HIV-negative - Abdominal  ultrasound-unremarkable -Monitor for signs of withdrawal  Mild rhabdomyolysis - Continue IV fluids  Diarrhea, nonspecific - C. difficile-negative.  Could be related to COVID-19 infection  Macrocytic anemia - Suspect from underlying alcohol use - B12, TSH-normal.  Folate-pending - Iron studies shows low saturation, elevated ferritin.  History of esophagitis, ulcerative - Continue PPI  Polysubstance abuse - Will need counseling to quit using this  History of DVT PE LLE, Chronic - Used to be on anticoagulation?Marland Kitchen  Ultrasound Dopplers discussed that shows chronic DVT.  Pharmacy has been consulted for anticoagulation history assistance (tx with Coumadin?). - Has history of laser ablation of his DVT?Marland Kitchen  Followed [facility].  Hx of dilated aorta Seen by CT surgery October 2019.  Discussed blood pressure control and follow-up in 2 years with CTA chest.  This can be done outpatient upon discharge  DVT prophylaxis: Subcu heparin Code Status: Full code Family Communication:    Status is: Inpatient  Remains inpatient appropriate because:IV treatments appropriate due to intensity of illness or inability to take PO   Dispo: The patient is from: Home              Anticipated d/c is to: Home              Anticipated d/c date is: > 3 days              Patient currently is not medically stable to d/c.  Multiple ongoing issues to address as mentioned above including transaminitis, rhabdomyolysis, elevated troponin, aspiration pneumonia.   Body mass index is 29.82 kg/m.     Subjective: Feels ok this morning, no complaints. RN in the room with me.   Review of Systems Otherwise negative except as per HPI,  including: General = no fevers, chills, dizziness,  fatigue HEENT/EYES = negative for loss of vision, double vision, blurred vision,  sore throa Cardiovascular= negative for chest pain, palpitation Respiratory/lungs= negative for shortness of breath, cough, wheezing; hemoptysis,   Gastrointestinal= negative for nausea, vomiting, abdominal pain Genitourinary= negative for Dysuria MSK = Negative for arthralgia, myalgias Neurology= Negative for headache, numbness, tingling  Psychiatry= Negative for suicidal and homocidal ideation Skin= Negative for Rash    Examination: Constitutional: Not in acute distress Respiratory: Clear to auscultation bilaterally Cardiovascular: Normal sinus rhythm, no rubs Abdomen: Nontender nondistended good bowel sounds Musculoskeletal: No edema noted Skin: No rashes seen Neurologic: CN 2-12 grossly intact.  And nonfocal Psychiatric: Normal judgment and insight. Alert and oriented x 3. Normal mood.     Objective: Vitals:   09/05/20 1211 09/05/20 2133 09/06/20 0447 09/06/20 0500  BP: (!) 181/89 (!) 161/84 134/84   Pulse: 75 75 66   Resp: 20 18 16    Temp: 98.2 F (36.8 C) (!) 100.7 F (38.2 C) 98.9 F (37.2 C)   TempSrc:      SpO2: 95% 96% 94%   Weight:    83.8 kg  Height:        Intake/Output Summary (Last 24 hours) at 09/06/2020 0728 Last data filed at 09/06/2020 0449 Gross per 24 hour  Intake 620 ml  Output 4600 ml  Net -3980 ml   Filed Weights   09/03/20 2147 09/04/20 0339 09/06/20 0500  Weight: 89 kg 89 kg 83.8 kg     Data Reviewed:   CBC: Recent Labs  Lab 09/03/20 1623 09/03/20 1803 09/03/20 2009 09/04/20 0348  WBC 8.2  --  6.7 7.4  NEUTROABS 6.4  --   --   --   HGB 12.7* 13.3 11.5* 11.9*  HCT 41.8 39.0 38.5* 37.4*  MCV 105.3*  --  104.3* 100.8*  PLT 208  --  163 423*   Basic Metabolic Panel: Recent Labs  Lab 09/03/20 1623 09/03/20 1803 09/03/20 2009 09/04/20 0348  NA 139 139  --  140  K 5.7* 5.4*  --  4.4  CL 108  --   --  112*  CO2 13*  --   --  17*  GLUCOSE 73  --   --  117*  BUN 21  --   --  26*  CREATININE 2.45*  --   --  2.30*  CALCIUM 8.6*  --   --  7.8*  MG  --   --  1.6* 1.4*  PHOS  --   --  6.3* 3.7   GFR: Estimated Creatinine Clearance: 33 mL/min (A) (by C-G formula based  on SCr of 2.3 mg/dL (H)). Liver Function Tests: Recent Labs  Lab 09/03/20 1623 09/04/20 0348  AST 502* 974*  ALT 147* 203*  ALKPHOS 97 66  BILITOT 0.8 0.7  PROT 7.0 5.6*  ALBUMIN 3.6 2.9*   No results for input(s): LIPASE, AMYLASE in the last 168 hours. No results for input(s): AMMONIA in the last 168 hours. Coagulation Profile: Recent Labs  Lab 09/03/20 2009  INR 1.4*   Cardiac Enzymes: Recent Labs  Lab 09/03/20 2009 09/04/20 0348 09/05/20 0815  CKTOTAL 367 674* 922*   BNP (last 3 results) No results for input(s): PROBNP in the last 8760 hours. HbA1C: Recent Labs    09/03/20 2009  HGBA1C 5.5   CBG: Recent Labs  Lab 09/05/20 1210 09/05/20 1709 09/05/20 2018 09/05/20 2354 09/06/20 0444  GLUCAP 76 77 80 96 126*  Lipid Profile: No results for input(s): CHOL, HDL, LDLCALC, TRIG, CHOLHDL, LDLDIRECT in the last 72 hours. Thyroid Function Tests: Recent Labs    09/05/20 0815  TSH 1.007   Anemia Panel: Recent Labs    09/05/20 0815  VITAMINB12 1,455*  FERRITIN 4,797*  TIBC 258  IRON 20*   Sepsis Labs: Recent Labs  Lab 09/03/20 1705 09/03/20 2009 09/04/20 0348  PROCALCITON  --  3.60  --   LATICACIDVEN 3.0*  --  2.6*    Recent Results (from the past 240 hour(s))  Urine culture     Status: Abnormal   Collection Time: 09/03/20  5:05 PM   Specimen: Urine, Random  Result Value Ref Range Status   Specimen Description URINE, RANDOM  Final   Special Requests NONE  Final   Culture (A)  Final    <10,000 COLONIES/mL INSIGNIFICANT GROWTH Performed at [facility], [place] [address] [place], [place] [address]    Report Status 09/05/2020 FINAL  Final  Resp Panel by RT-PCR (Flu A&B, Covid) Nasopharyngeal Swab     Status: Abnormal   Collection Time: 09/03/20  5:05 PM   Specimen: Nasopharyngeal Swab; Nasopharyngeal(NP) swabs in vial transport medium  Result Value Ref Range Status   SARS Coronavirus 2 by RT PCR POSITIVE (A) NEGATIVE Final    Comment:  RESULT CALLED TO, READ BACK BY AND VERIFIED WITH: A BANKS RN 1841 09/03/20 A BROWNING (NOTE) SARS-CoV-2 target nucleic acids are DETECTED.  The SARS-CoV-2 RNA is generally detectable in upper respiratory specimens during the acute phase of infection. Positive results are indicative of the presence of the identified virus, but do not rule out bacterial infection or co-infection with other pathogens not detected by the test. Clinical correlation with patient history and other diagnostic information is necessary to determine patient infection status. The expected result is Negative.  Fact Sheet for Patients: EntrepreneurPulse.com.au  Fact Sheet for Healthcare Providers: IncredibleEmployment.be  This test is not yet approved or cleared by the [place] FDA and  has been authorized for detection and/or diagnosis of SARS-CoV-2 by FDA under an Emergency Use Authorization (EUA).  This EUA will remain in effect (meaning this test can b e used) for the duration of  the COVID-19 declaration under Section 564(b)(1) of the Act, 21 U.S.C. section 360bbb-3(b)(1), unless the authorization is terminated or revoked sooner.     Influenza A by PCR NEGATIVE NEGATIVE Final   Influenza B by PCR NEGATIVE NEGATIVE Final    Comment: (NOTE) The Xpert Xpress SARS-CoV-2/FLU/RSV plus assay is intended as an aid in the diagnosis of influenza from Nasopharyngeal swab specimens and should not be used as a sole basis for treatment. Nasal washings and aspirates are unacceptable for Xpert Xpress SARS-CoV-2/FLU/RSV testing.  Fact Sheet for Patients: EntrepreneurPulse.com.au  Fact Sheet for Healthcare Providers: IncredibleEmployment.be  This test is not yet approved or cleared by the [place] FDA and has been authorized for detection and/or diagnosis of SARS-CoV-2 by FDA under an Emergency Use Authorization (EUA). This EUA will  remain in effect (meaning this test can be used) for the duration of the COVID-19 declaration under Section 564(b)(1) of the Act, 21 U.S.C. section 360bbb-3(b)(1), unless the authorization is terminated or revoked.  Performed at [facility], [place] [address], [place], [place] [address]   CSF culture     Status: None (Preliminary result)   Collection Time: 09/03/20  6:11 PM   Specimen: CSF; Cerebrospinal Fluid  Result Value Ref Range Status  Specimen Description CSF  Final   Special Requests NONE  Final   Gram Stain   Final    WBC PRESENT, PREDOMINANTLY MONONUCLEAR NO ORGANISMS SEEN CYTOSPIN SMEAR    Culture   Final    NO GROWTH 2 DAYS Performed at [facility], [address] [place], [place] [address]    Report Status PENDING  Incomplete  Culture, blood (routine x 2)     Status: None (Preliminary result)   Collection Time: 09/03/20  8:12 PM   Specimen: BLOOD RIGHT HAND  Result Value Ref Range Status   Specimen Description BLOOD RIGHT HAND  Final   Special Requests   Final    BOTTLES DRAWN AEROBIC AND ANAEROBIC Blood Culture results may not be optimal due to an inadequate volume of blood received in culture bottles   Culture   Final    NO GROWTH 2 DAYS Performed at [facility], [place] [address] [place], [place] [address]    Report Status PENDING  Incomplete  Culture, blood (routine x 2)     Status: None (Preliminary result)   Collection Time: 09/04/20  3:48 AM   Specimen: BLOOD RIGHT HAND  Result Value Ref Range Status   Specimen Description BLOOD RIGHT HAND  Final   Special Requests   Final    BOTTLES DRAWN AEROBIC ONLY Blood Culture adequate volume   Culture   Final    NO GROWTH 1 DAY Performed at [facility], [place] [address] [place], [place] [address]    Report Status PENDING  Incomplete  C Difficile Quick Screen w PCR reflex     Status: None   Collection Time: 09/05/20  2:11 PM   Specimen: STOOL  Result Value Ref Range Status   C Diff  antigen NEGATIVE NEGATIVE Final   C Diff toxin NEGATIVE NEGATIVE Final   C Diff interpretation No C. difficile detected.  Final    Comment: Performed at [facility], [place] [address] [place], [place] [address]         Radiology Studies: VAS [place] LOWER EXTREMITY VENOUS (DVT)  Result Date: 09/04/2020  Lower Venous DVT Study Indications: Swelling. Other Indications: Covid. Risk Factors: DVT Hx Chronic DVT left. Anticoagulation: Heparin. Comparison Study: Previous 07/14/2018 Chronic DVT left Performing Technologist: Vonzell Schlatter RVT  Examination Guidelines: A complete evaluation includes B-mode imaging, spectral Doppler, color Doppler, and power Doppler as needed of all accessible portions of each vessel. Bilateral testing is considered an integral part of a complete examination. Limited examinations for reoccurring indications may be performed as noted. The reflux portion of the exam is performed with the patient in reverse Trendelenburg.  +-----+---------------+---------+-----------+----------+--------------+ RIGHTCompressibilityPhasicitySpontaneityPropertiesThrombus Aging +-----+---------------+---------+-----------+----------+--------------+ CFV  Full           Yes      Yes                                 +-----+---------------+---------+-----------+----------+--------------+ SFJ  Full                                                        +-----+---------------+---------+-----------+----------+--------------+   +---------+---------------+---------+-----------+----------+--------------+ LEFT     CompressibilityPhasicitySpontaneityPropertiesThrombus Aging +---------+---------------+---------+-----------+----------+--------------+ CFV      Full  Yes      Yes                                 +---------+---------------+---------+-----------+----------+--------------+ SFJ      Full                                                         +---------+---------------+---------+-----------+----------+--------------+ FV Prox  Partial                                                     +---------+---------------+---------+-----------+----------+--------------+ FV Mid   Partial                                                     +---------+---------------+---------+-----------+----------+--------------+ FV DistalPartial                                                     +---------+---------------+---------+-----------+----------+--------------+ PFV      Partial                                                     +---------+---------------+---------+-----------+----------+--------------+ POP      Partial        Yes      Yes                                 +---------+---------------+---------+-----------+----------+--------------+ PTV      Full                                                        +---------+---------------+---------+-----------+----------+--------------+ PERO     Full                                                        +---------+---------------+---------+-----------+----------+--------------+     Summary: RIGHT: - No evidence of common femoral vein obstruction.  LEFT: - Findings consistent with chronic deep vein thrombosis involving the left femoral vein, and left popliteal vein. - No cystic structure found in the popliteal fossa.  *See table(s) above for measurements and observations. Electronically signed by Servando Snare MD on 09/04/2020 at 2:28:05 PM.    Final    ECHOCARDIOGRAM LIMITED  Result Date: 09/05/2020    ECHOCARDIOGRAM LIMITED REPORT   Patient Name:   [facility]  Witczak Date of Exam: 09/05/2020 Medical Rec #:  [id number]      Height:       66.0 in Accession #:    [id number]     Weight:       196.2 lb Date of Birth:  [birth year]      BSA:          1.984 m Patient Age:    19 years       BP:           181/89 mmHg Patient Gender: M              HR:           77 bpm. Exam Location:   Inpatient Procedure: Limited Echo, Limited Color Doppler and Cardiac Doppler Indications:    syncope 780.2  History:        Patient has prior history of Echocardiogram examinations, most                 recent 07/14/2018. Covid, Arrythmias:RBBB; Risk                 Factors:Hypertension and Diabetes.  Sonographer:    Johny Chess Referring Phys: [id number] Krosby Ritchie CHIRAG Jinelle Butchko IMPRESSIONS  1. Left ventricular ejection fraction, by estimation, is 60 to 65%. The left ventricle has normal function. Left ventricular diastolic parameters are consistent with Grade I diastolic dysfunction (impaired relaxation).  2. The mitral valve is normal in structure. Mild mitral valve regurgitation. No evidence of mitral stenosis.  3. The aortic valve is tricuspid. Aortic valve regurgitation is not visualized. No aortic stenosis is present.  4. There is normal pulmonary artery systolic pressure.  5. The inferior vena cava is dilated in size with >50% respiratory variability, suggesting right atrial pressure of 8 mmHg. FINDINGS  Left Ventricle: Left ventricular ejection fraction, by estimation, is 60 to 65%. The left ventricle has normal function. Left ventricular diastolic parameters are consistent with Grade I diastolic dysfunction (impaired relaxation). Normal left ventricular filling pressure. Right Ventricle: There is normal pulmonary artery systolic pressure. The tricuspid regurgitant velocity is 2.50 m/s, and with an assumed right atrial pressure of 8 mmHg, the estimated right ventricular systolic pressure is 123456 mmHg. Mitral Valve: The mitral valve is normal in structure. Mild mitral valve regurgitation. No evidence of mitral valve stenosis. Tricuspid Valve: The tricuspid valve is normal in structure. Tricuspid valve regurgitation is mild . No evidence of tricuspid stenosis. Aortic Valve: The aortic valve is tricuspid. Aortic valve regurgitation is not visualized. No aortic stenosis is present. Pulmonic Valve: The pulmonic  valve was normal in structure. Pulmonic valve regurgitation is trivial. No evidence of pulmonic stenosis. Aorta: The aortic root is normal in size and structure. Venous: The inferior vena cava is dilated in size with greater than 50% respiratory variability, suggesting right atrial pressure of 8 mmHg. LEFT VENTRICLE PLAX 2D LVIDd:         4.90 cm  Diastology LVIDs:         3.30 cm  LV e' medial:    8.92 cm/s LV PW:         1.10 cm  LV E/e' medial:  8.2 LV IVS:        0.90 cm  LV e' lateral:   11.30 cm/s LVOT diam:     2.10 cm  LV E/e' lateral: 6.4 LV SV:         101 LV SV Index:   51 LVOT Area:  3.46 cm  IVC IVC diam: 2.50 cm LEFT ATRIUM         Index LA diam:    4.00 cm 2.02 cm/m  AORTIC VALVE LVOT Vmax:   146.00 cm/s LVOT Vmean:  96.800 cm/s LVOT VTI:    0.292 m  AORTA Ao Root diam: 3.30 cm Ao Asc diam:  3.30 cm MITRAL VALVE               TRICUSPID VALVE MV Area (PHT): 3.03 cm    TR Peak grad:   25.0 mmHg MV Decel Time: 250 msec    TR Vmax:        250.00 cm/s MV E velocity: 72.80 cm/s MV A velocity: 96.00 cm/s  SHUNTS MV E/A ratio:  0.76        Systemic VTI:  0.29 m                            Systemic Diam: 2.10 cm Skeet Latch MD Electronically signed by Skeet Latch MD Signature Date/Time: 09/05/2020/2:37:24 PM    Final    US Abdomen Limited RUQ (LIVER/GB)  Result Date: 09/05/2020 CLINICAL DATA:  Transaminitis Hepatitis C Hypertension Type 2 diabetes EXAM: ULTRASOUND ABDOMEN LIMITED RIGHT UPPER QUADRANT COMPARISON:  CT abdomen pelvis 06/19/2019 FINDINGS: Gallbladder: Surgically absent Common bile duct: Diameter: 2 mm Liver: No focal lesion identified. Within normal limits in parenchymal echogenicity. Portal vein is patent on color Doppler imaging with normal direction of blood flow towards the liver. Other: None. IMPRESSION: No significant abnormality of the liver. Electronically Signed   By: Miachel Roux M.D.   On: 09/05/2020 11:14        Scheduled Meds: . amLODipine  5 mg Oral Daily   . Chlorhexidine Gluconate Cloth  6 each Topical Daily  . docusate  100 mg Per Tube BID  . folic acid  1 mg Per Tube Daily  . heparin  5,000 Units Subcutaneous Q8H  . insulin aspart  0-9 Units Subcutaneous Q4H  . metoprolol tartrate  25 mg Oral BID  . multivitamin with minerals  1 tablet Per Tube Daily  . pantoprazole sodium  40 mg Per Tube QHS  . polyethylene glycol  17 g Per Tube Daily  . thiamine  100 mg Per Tube Daily   Continuous Infusions: . sodium chloride 75 mL/hr at 09/05/20 1024  . ampicillin-sulbactam (UNASYN) IV 3 g (09/06/20 0240)     LOS: 3 days   Time spent= 35 mins    Kaspar Albornoz Arsenio Loader, MD [facility]  If 7PM-7AM, please contact night-coverage  09/06/2020, 7:28 AM

## 2020-09-06 NOTE — Consult Note (Signed)
[facility] Consultation Note  Referring Provider: [facility] Primary Care Physician:  [name], MD  Reason for Consultation:  Elevated LFTs  HPI: Todd [name]. is a 65 y.o. male found down in homeless shelter.  Intubated in ED, found to have COVID and pneumonia.  Also with elevated troponins and LFTs.  Denies abdominal pain, nausea, vomiting.  Denies prior issues with liver disease, though per chart he was treated for HCV with Harvoni in 2017.  No hematemesis or hematochezia.   Past Medical History:  Diagnosis Date  . Arthritis    "fingers" (07/14/2018)  . Depression   . DVT (deep venous thrombosis) (HCC) LLE  . Hepatitis C     finished harvoni tx ~ 2017  . Hypercholesterolemia   . Hypertension   . Prostate cancer ([place]) 9yrs ago  . Pulmonary embolism and infarction ([place]) 07/13/2018  . Sleep apnea    not currently using cpap, mask causing vertigo  . Type II diabetes mellitus ([place])     Past Surgical History:  Procedure Laterality Date  . BIOPSY  01/12/2019   Procedure: BIOPSY;  Surgeon: Ronald Lobo, MD;  Location: [facility] ENDOSCOPY;  Service: Endoscopy;;  . BIOPSY  06/23/2019   Procedure: BIOPSY;  Surgeon: Arta Silence, MD;  Location: [facility];  Service: Endoscopy;;  . COLONOSCOPY WITH PROPOFOL N/A 02/19/2014   Procedure: COLONOSCOPY WITH PROPOFOL;  Surgeon: Garlan Fair, MD;  Location: [facility] ENDOSCOPY;  Service: Endoscopy;  Laterality: N/A;  . ENDOVENOUS ABLATION SAPHENOUS VEIN W/ LASER Left 11/22/2017   endovenous laser ablation L SSV by Tinnie Gens MD   . ESOPHAGEAL BRUSHING  06/23/2019   Procedure: ESOPHAGEAL BRUSHING;  Surgeon: Arta Silence, MD;  Location: [facility];  Service: Endoscopy;;  . ESOPHAGOGASTRODUODENOSCOPY (EGD) WITH PROPOFOL N/A 01/12/2019   Procedure: ESOPHAGOGASTRODUODENOSCOPY (EGD) WITH PROPOFOL;  Surgeon: Ronald Lobo, MD;  Location: [place];  Service: Endoscopy;  Laterality: N/A;  Patient is also scheduled for  barium swallow; please notify radiology after patient's EGD is complete so that barium swallow follows the endoscopy, not vice versa  . ESOPHAGOGASTRODUODENOSCOPY (EGD) WITH PROPOFOL N/A 06/23/2019   Procedure: ESOPHAGOGASTRODUODENOSCOPY (EGD) WITH PROPOFOL;  Surgeon: Arta Silence, MD;  Location: [place];  Service: Endoscopy;  Laterality: N/A;  . PROSTATECTOMY  2008  . REPAIR QUADRICEPS / HAMSTRING MUSCLE Right     Prior to Admission medications   Medication Sig Start Date End Date Taking? Authorizing Provider  aspirin EC 81 MG tablet Take 81 mg by mouth daily. Swallow whole.   Yes [provider]  Famotidine (ACID CONTROLLER PO) Take 1 tablet by mouth daily as needed (reflux).   Yes [provider]  amLODipine (NORVASC) 10 MG tablet Take 1 tablet (10 mg total) by mouth daily. Patient not taking: Reported on 09/05/2020 06/30/19 07/30/19  [place] Donnamarie Poag, DO  etodolac (LODINE) 300 MG capsule Take 1 capsule (300 mg total) by mouth every 8 (eight) hours. Patient not taking: No sig reported 06/16/20   Dorie Rank, MD  metoprolol tartrate (LOPRESSOR) 25 MG tablet Take 1 tablet (25 mg total) by mouth 2 (two) times daily. Patient not taking: Reported on 09/05/2020 06/30/19 07/30/19  [place] Donnamarie Poag, DO  pantoprazole (PROTONIX) 40 MG tablet Take 1 tablet (40 mg total) by mouth 2 (two) times daily. Patient not taking: Reported on 09/05/2020 09/26/19 10/26/19  Caccavale, Sophia, PA-C  predniSONE (DELTASONE) 50 MG tablet Take 1 tablet (50 mg total) by mouth daily. Patient not taking: No sig reported 06/16/20  Dorie Rank, MD  sertraline (ZOLOFT) 25 MG tablet Take 1 tablet (25 mg total) by mouth daily. Patient not taking: No sig reported 06/30/19   [place] Donnamarie Poag, DO  sucralfate (CARAFATE) 1 GM/10ML suspension Take 10 mLs (1 g total) by mouth 4 (four) times daily -  with meals and at bedtime. Patient not taking: Reported on 09/05/2020 09/26/19 10/26/19  Caccavale, Jillyn Ledger, PA-C     Current Facility-Administered Medications  Medication Dose Route Frequency Provider Last Rate Last Admin  . albuterol (PROVENTIL) (2.5 MG/3ML) 0.083% nebulizer solution 2.5 mg  2.5 mg Nebulization Q2H PRN Bowser, Laurel Dimmer, NP      . amLODipine (NORVASC) tablet 5 mg  5 mg Oral Daily Isaiah Serge, NP   5 mg at 09/06/20 1004  . Ampicillin-Sulbactam (UNASYN) 3 g in sodium chloride 0.9 % 100 mL IVPB  3 g Intravenous Q8H Chand, Sudham, MD 200 mL/hr at 09/06/20 1000 3 g at 09/06/20 1000  . aspirin EC tablet 81 mg  81 mg Oral Daily Arnoldo Lenis, MD   81 mg at 09/06/20 1024  . Chlorhexidine Gluconate Cloth 2 % PADS 6 each  6 each Topical Daily Candee Furbish, MD   6 each at 09/04/20 2100  . dextromethorphan-guaiFENesin (MUCINEX DM) 30-600 MG per 12 hr tablet 1 tablet  1 tablet Oral BID PRN Amin, Ankit Chirag, MD      . docusate (COLACE) 50 MG/5ML liquid 100 mg  100 mg Per Tube BID Cristal Generous, NP   100 mg at 09/06/20 1004  . folic acid (FOLVITE) tablet 1 mg  1 mg Per Tube Daily Candee Furbish, MD   1 mg at 09/06/20 1004  . heparin injection 5,000 Units  5,000 Units Subcutaneous Q8H Bowser, Laurel Dimmer, NP   5,000 Units at 09/06/20 0511  . insulin aspart (novoLOG) injection 0-9 Units  0-9 Units Subcutaneous Q4H Bowser, Laurel Dimmer, NP   2 Units at 09/06/20 1009  . magnesium sulfate IVPB 4 g 100 mL  4 g Intravenous Once Amin, Ankit Chirag, MD 50 mL/hr at 09/06/20 1136 4 g at 09/06/20 1136  . metoprolol tartrate (LOPRESSOR) tablet 25 mg  25 mg Oral BID Isaiah Serge, NP   25 mg at 09/06/20 1004  . multivitamin with minerals tablet 1 tablet  1 tablet Per Tube Daily Candee Furbish, MD   1 tablet at 09/06/20 1004  . ondansetron (ZOFRAN) injection 4 mg  4 mg Intravenous Q6H PRN Bowser, Laurel Dimmer, NP      . pantoprazole sodium (PROTONIX) 40 mg/20 mL oral suspension 40 mg  40 mg Per Tube QHS Jacky Kindle, MD   40 mg at 09/05/20 2146  . polyethylene glycol (MIRALAX / GLYCOLAX) packet 17 g  17 g Per Tube  Daily Bowser, Laurel Dimmer, NP   17 g at 09/06/20 1005  . polyethylene glycol (MIRALAX / GLYCOLAX) packet 17 g  17 g Per Tube Daily PRN Candee Furbish, MD      . potassium chloride SA (KLOR-CON) CR tablet 40 mEq  40 mEq Oral Q4H Amin, Ankit Chirag, MD      . thiamine tablet 100 mg  100 mg Per Tube Daily Candee Furbish, MD   100 mg at 09/06/20 1004    Allergies as of 09/03/2020  . (No Known Allergies)    Family History  Problem Relation Age of Onset  . Cancer Father        PROSTATE  Social History   Socioeconomic History  . Marital status: Single    Spouse name: Not on file  . Number of children: 2  . Years of education: 12th  . Highest education level: Not on file  Occupational History  . Occupation: post office  Tobacco Use  . Smoking status: Current Every Day Smoker    Packs/day: 0.12    Years: 45.00    Pack years: 5.40    Types: Cigarettes  . Smokeless tobacco: Never Used  Vaping Use  . Vaping Use: Never used  Substance and Sexual Activity  . Alcohol use: Yes  . Drug use: Yes  . Sexual activity: Not Currently  Other Topics Concern  . Not on file  Social History Narrative   ** Merged History Encounter **       Patient lives at home alone.Marland KitchenMarland KitchenDrinks coffee daily    Social Determinants of Health   Financial Resource Strain: Not on file  Food Insecurity: Not on file  Transportation Needs: Not on file  Physical Activity: Not on file  Stress: Not on file  Social Connections: Not on file  Intimate Partner Violence: Not on file    Review of Systems: As per HPI, all others negative  Physical Exam: Vital signs in last 24 hours: Temp:  [98 F (36.7 C)-100.7 F (38.2 C)] 98 F (36.7 C) (01/22 0957) Pulse Rate:  [65-75] 65 (01/22 0957) Resp:  [16-18] 18 (01/22 0957) BP: (134-161)/(76-84) 135/76 (01/22 0957) SpO2:  [93 %-96 %] 93 % (01/22 0957) Weight:  [83.8 kg] 83.8 kg (01/22 0500) Last BM Date: 09/06/20 General:   Overweight, chronically  ill-appearing Head:  Normocephalic and atraumatic. Eyes:  Sclera clear, no icterus.   Conjunctiva pink. Ears:  Normal auditory acuity. Nose:  No deformity, discharge,  or lesions. Mouth:  No deformity or lesions.  Oropharynx pink & moist. Neck:  Supple; no masses or thyromegaly. Abdomen:  Soft, protuberant, nontender and nondistended. No masses, hepatosplenomegaly or hernias noted. Normal bowel sounds, without guarding, and without rebound.     Msk:  Symmetrical without gross deformities. Normal posture. Pulses:  Normal pulses noted. Extremities:  Without clubbing or edema. Neurologic:  Alert and  oriented x4;  grossly normal neurologically. Skin:  Intact without significant lesions or rashes. Psych:  Alert and cooperative. Normal mood and affect.   Lab Results: Recent Labs    09/03/20 2009 09/04/20 0348 09/06/20 0500  WBC 6.7 7.4 5.0  HGB 11.5* 11.9* 12.2*  HCT 38.5* 37.4* 35.9*  PLT 163 135* 100*   BMET Recent Labs    09/03/20 1623 09/03/20 1803 09/04/20 0348 09/06/20 0500  NA 139 139 140 140  K 5.7* 5.4* 4.4 3.0*  CL 108  --  112* 109  CO2 13*  --  17* 19*  GLUCOSE 73  --  117* 91  BUN 21  --  26* 16  CREATININE 2.45*  --  2.30* 2.02*  CALCIUM 8.6*  --  7.8* 8.0*   LFT Recent Labs    09/06/20 0500  PROT 6.2*  ALBUMIN 2.9*  AST 8,396*  ALT 2,465*  ALKPHOS 77  BILITOT 1.2   PT/INR Recent Labs    09/03/20 2009 09/06/20 1158  LABPROT 16.5* 14.7  INR 1.4* 1.2    Studies/Results: ECHOCARDIOGRAM LIMITED  Result Date: 09/05/2020    ECHOCARDIOGRAM LIMITED REPORT   Patient Name:   KEELYN BARLEY Date of Exam: 09/05/2020 Medical Rec #:  [id number]      Height:  66.0 in Accession #:    [id number]     Weight:       196.2 lb Date of Birth:  09-21-1955      BSA:          1.984 m Patient Age:    64 years       BP:           181/89 mmHg Patient Gender: M              HR:           77 bpm. Exam Location:  Inpatient Procedure: Limited Echo, Limited Color Doppler  and Cardiac Doppler Indications:    syncope 780.2  History:        Patient has prior history of Echocardiogram examinations, most                 recent 07/14/2018. Covid, Arrythmias:RBBB; Risk                 Factors:Hypertension and Diabetes.  Sonographer:    Delcie RochLauren Pennington Referring Phys: [id number] ANKIT CHIRAG AMIN IMPRESSIONS  1. Left ventricular ejection fraction, by estimation, is 60 to 65%. The left ventricle has normal function. Left ventricular diastolic parameters are consistent with Grade I diastolic dysfunction (impaired relaxation).  2. The mitral valve is normal in structure. Mild mitral valve regurgitation. No evidence of mitral stenosis.  3. The aortic valve is tricuspid. Aortic valve regurgitation is not visualized. No aortic stenosis is present.  4. There is normal pulmonary artery systolic pressure.  5. The inferior vena cava is dilated in size with >50% respiratory variability, suggesting right atrial pressure of 8 mmHg. FINDINGS  Left Ventricle: Left ventricular ejection fraction, by estimation, is 60 to 65%. The left ventricle has normal function. Left ventricular diastolic parameters are consistent with Grade I diastolic dysfunction (impaired relaxation). Normal left ventricular filling pressure. Right Ventricle: There is normal pulmonary artery systolic pressure. The tricuspid regurgitant velocity is 2.50 m/s, and with an assumed right atrial pressure of 8 mmHg, the estimated right ventricular systolic pressure is 33.0 mmHg. Mitral Valve: The mitral valve is normal in structure. Mild mitral valve regurgitation. No evidence of mitral valve stenosis. Tricuspid Valve: The tricuspid valve is normal in structure. Tricuspid valve regurgitation is mild . No evidence of tricuspid stenosis. Aortic Valve: The aortic valve is tricuspid. Aortic valve regurgitation is not visualized. No aortic stenosis is present. Pulmonic Valve: The pulmonic valve was normal in structure. Pulmonic valve regurgitation  is trivial. No evidence of pulmonic stenosis. Aorta: The aortic root is normal in size and structure. Venous: The inferior vena cava is dilated in size with greater than 50% respiratory variability, suggesting right atrial pressure of 8 mmHg. LEFT VENTRICLE PLAX 2D LVIDd:         4.90 cm  Diastology LVIDs:         3.30 cm  LV e' medial:    8.92 cm/s LV PW:         1.10 cm  LV E/e' medial:  8.2 LV IVS:        0.90 cm  LV e' lateral:   11.30 cm/s LVOT diam:     2.10 cm  LV E/e' lateral: 6.4 LV SV:         101 LV SV Index:   51 LVOT Area:     3.46 cm  IVC IVC diam: 2.50 cm LEFT ATRIUM         Index LA diam:  4.00 cm 2.02 cm/m  AORTIC VALVE LVOT Vmax:   146.00 cm/s LVOT Vmean:  96.800 cm/s LVOT VTI:    0.292 m  AORTA Ao Root diam: 3.30 cm Ao Asc diam:  3.30 cm MITRAL VALVE               TRICUSPID VALVE MV Area (PHT): 3.03 cm    TR Peak grad:   25.0 mmHg MV Decel Time: 250 msec    TR Vmax:        250.00 cm/s MV E velocity: 72.80 cm/s MV A velocity: 96.00 cm/s  SHUNTS MV E/A ratio:  0.76        Systemic VTI:  0.29 m                            Systemic Diam: 2.10 cm Skeet Latch MD Electronically signed by Skeet Latch MD Signature Date/Time: 09/05/2020/2:37:24 PM    Final    US Abdomen Limited RUQ (LIVER/GB)  Result Date: 09/05/2020 CLINICAL DATA:  Transaminitis Hepatitis C Hypertension Type 2 diabetes EXAM: ULTRASOUND ABDOMEN LIMITED RIGHT UPPER QUADRANT COMPARISON:  CT abdomen pelvis 06/19/2019 FINDINGS: Gallbladder: Surgically absent Common bile duct: Diameter: 2 mm Liver: No focal lesion identified. Within normal limits in parenchymal echogenicity. Portal vein is patent on color Doppler imaging with normal direction of blood flow towards the liver. Other: None. IMPRESSION: No significant abnormality of the liver. Electronically Signed   By: Miachel Roux M.D.   On: 09/05/2020 11:14    Impression:  1.  COVID + 2.  Hypoxic respiratory failure. 3.  Elevated troponins. 4.  Elevated LFTs.  Ultrasound  unrevealing.  HCV + likely chronic from prior diagnosis; awaiting LFTs to see if has reacquired infection.. 5.  Seizures.  Plan:  1.  Elevated LFTs most likely ischemic hepatitis vs rhabdomyolysis vs both.  Mental status improving and patient does not have coagulopathy; thus doubt liver failure; does not need liver transplantation at this time (but even if he did would not be candidate for liver transplant at this time for multiple reasons). 2.  Follow LFTs and INR trend. 3.  Awaiting HCV viral load. 4.  Not candidate for immunosuppressant therapy for liver disease at this point, regardless of whether he has acute hepatitis C, given acute infection. 5.  [facility] will revisit Monday.   LOS: 3 days   Avaiyah Strubel M  09/06/2020, 1:20 PM  Cell [phone] If no answer or after 5 PM call [phone]

## 2020-09-06 NOTE — Plan of Care (Signed)

## 2020-09-07 ENCOUNTER — Other Ambulatory Visit: Payer: Self-pay

## 2020-09-07 DIAGNOSIS — R7401 Elevation of levels of liver transaminase levels: Secondary | ICD-10-CM | POA: Diagnosis not present

## 2020-09-07 DIAGNOSIS — U071 COVID-19: Secondary | ICD-10-CM | POA: Diagnosis not present

## 2020-09-07 DIAGNOSIS — J9601 Acute respiratory failure with hypoxia: Secondary | ICD-10-CM | POA: Diagnosis not present

## 2020-09-07 LAB — COMPREHENSIVE METABOLIC PANEL
ALT: 1584 U/L — ABNORMAL HIGH (ref 0–44)
AST: 1670 U/L — ABNORMAL HIGH (ref 15–41)
Albumin: 2.7 g/dL — ABNORMAL LOW (ref 3.5–5.0)
Alkaline Phosphatase: 61 U/L (ref 38–126)
Anion gap: 10 (ref 5–15)
BUN: 15 mg/dL (ref 8–23)
CO2: 18 mmol/L — ABNORMAL LOW (ref 22–32)
Calcium: 8.1 mg/dL — ABNORMAL LOW (ref 8.9–10.3)
Chloride: 110 mmol/L (ref 98–111)
Creatinine, Ser: 1.92 mg/dL — ABNORMAL HIGH (ref 0.61–1.24)
GFR, Estimated: 38 mL/min — ABNORMAL LOW (ref >60)
Glucose, Bld: 130 mg/dL — ABNORMAL HIGH (ref 70–99)
Potassium: 3.9 mmol/L (ref 3.5–5.1)
Sodium: 138 mmol/L (ref 135–145)
Total Bilirubin: 0.7 mg/dL (ref 0.3–1.2)
Total Protein: 6.2 g/dL — ABNORMAL LOW (ref 6.5–8.1)

## 2020-09-07 LAB — CBC
HCT: 34.8 % — ABNORMAL LOW (ref 39.0–52.0)
Hemoglobin: 11.8 g/dL — ABNORMAL LOW (ref 13.0–17.0)
MCH: 31.7 pg (ref 26.0–34.0)
MCHC: 33.9 g/dL (ref 30.0–36.0)
MCV: 93.5 fL (ref 80.0–100.0)
Platelets: 91 10*3/uL — ABNORMAL LOW (ref 150–400)
RBC: 3.72 MIL/uL — ABNORMAL LOW (ref 4.22–5.81)
RDW: 15.1 % (ref 11.5–15.5)
WBC: 3.3 10*3/uL — ABNORMAL LOW (ref 4.0–10.5)
nRBC: 0 % (ref 0.0–0.2)

## 2020-09-07 LAB — CK: Total CK: 347 U/L (ref 49–397)

## 2020-09-07 LAB — GLUCOSE, CAPILLARY
Glucose-Capillary: 97 mg/dL (ref 70–99)
Glucose-Capillary: 101 mg/dL — ABNORMAL HIGH (ref 70–99)
Glucose-Capillary: 101 mg/dL — ABNORMAL HIGH (ref 70–99)
Glucose-Capillary: 91 mg/dL (ref 70–99)

## 2020-09-07 LAB — CSF CULTURE W GRAM STAIN: Culture: NO GROWTH

## 2020-09-07 LAB — MAGNESIUM: Magnesium: 2.2 mg/dL (ref 1.7–2.4)

## 2020-09-07 NOTE — Progress Notes (Signed)
PROGRESS NOTE    Todd [name].  OU:[id number] DOB: [birth year] DOA: 09/03/2020 PCP: [name], MD   Brief Narrative:  65 year old with history of DM2, HTN, CKD, alcohol/tobacco and heroin abuse presented from homeless shelter when he was found down and intubated in the ER.  There was suspicion for seizure-like activity after intubation.  Also found to have COVID-19.  CT head showed bilateral chronic infarct, moderate cerebellar disease but nothing acute.  EEG showed diffuse encephalopathy.  LP was negative for meningitis.  Initially received vancomycin, Zosyn and acyclovir which was later transitioned to Unasyn for aspiration pneumonia.  Seen by cardiology due to elevated troponin.  Also seen by GI due to severe transaminitis, currently advising to monitor   Assessment & Plan:   Active Problems:   Acute respiratory failure with hypoxia (HCC)  Acute hypoxic respiratory failure secondary to COVID-19 infection and aspiration pneumonia - Currently on room air.  Extubated 1/20.  Continue bronchodilators - IV Unasyn for aspiration pneumonia.  Tentative last day 1/26 - Procalcitonin 3.6 -Speech and swallow-dysphagia 3 diet - Incentive spirometer, flutter valve  Unresponsive concerns of acute meningitis/encephalitis - LP negative.  Vancomycin, Zosyn and acyclovir discontinued.  CSF cultures negative. - CT head- negative for acute changes but does have chronic disease - MRI-pending - EEG-diffuse encephalopathy, no seizures - UDS-negative -Echocardiogram -echocardiogram EF 65%, grade 1 DD  Elevated Troponins -Chest pain-free.  EKG at baseline shows bundle branch block.  Echocardiogram EF 65%, grade 1 DD.  Troponins elevated but flat.  Seen by cardiology, outpatient ischemic eval  AKI on CKD stage IIIa - Admission creatinine 2.4.  Improving.  A.m. labs pending  Transaminitis, alcohol pattern.  Worsening History of hepatitis C - Hep C antibodies positive, hep C viral load-  undetected - Shock liver versus ischemic hepatitis versus alcoholic hepatitis. - Trend CMP, currently appears to be improving - Seen by [facility] -HIV-negative - Abdominal ultrasound-unremarkable -Monitor for signs of withdrawal  Mild rhabdomyolysis - Continue IV fluids  Diarrhea, nonspecific - C. difficile-negative.  Could be related to COVID-19 infection  Macrocytic anemia - Suspect from underlying alcohol use - B12, TSH-normal.  Folate-pending - Iron studies shows low saturation, elevated ferritin.  History of esophagitis, ulcerative - Continue PPI  Polysubstance abuse - Will need counseling to quit using this  History of DVT PE LLE, Chronic - Used to be on anticoagulation?Marland Kitchen  Ultrasound Dopplers discussed that shows chronic DVT.  Pharmacy has been consulted for anticoagulation history assistance (tx with Coumadin?). - Has history of laser ablation of his DVT?Marland Kitchen  Followed [facility].  Hx of dilated aorta Seen by CT surgery October 2019.  Discussed blood pressure control and follow-up in 2 years with CTA chest.  This can be done outpatient upon discharge  PT/OT- ordered  DVT prophylaxis: Subcu heparin Code Status: Full code Family Communication:    Status is: Inpatient  Remains inpatient appropriate because:IV treatments appropriate due to intensity of illness or inability to take PO   Dispo: The patient is from: Home              Anticipated d/c is to: Home              Anticipated d/c date is: > 3 days              Patient currently is not medically stable to d/c.  Has severe transaminitis.  Rhabdomyolysis improving.  Maintain hospital stay until LFTs have started improving and cleared by GI  Body mass  index is 29.82 kg/m.     Subjective: Feels okay no complaints this morning.  Review of Systems Otherwise negative except as per HPI, including: General: Denies fever, chills, night sweats or unintended weight loss. Resp: Denies cough, wheezing, shortness of  breath. Cardiac: Denies chest pain, palpitations, orthopnea, paroxysmal nocturnal dyspnea. GI: Denies abdominal pain, nausea, vomiting, diarrhea or constipation GU: Denies dysuria, frequency, hesitancy or incontinence MS: Denies muscle aches, joint pain or swelling Neuro: Denies headache, neurologic deficits (focal weakness, numbness, tingling), abnormal gait Psych: Denies anxiety, depression, SI/HI/AVH Skin: Denies new rashes or lesions ID: Denies sick contacts, exotic exposures, travel   Examination:  Constitutional: Not in acute distress Respiratory: Clear to auscultation bilaterally Cardiovascular: Normal sinus rhythm, no rubs Abdomen: Nontender nondistended good bowel sounds Musculoskeletal: No edema noted Skin: No rashes seen Neurologic: CN 2-12 grossly intact.  And nonfocal Psychiatric: Normal judgment and insight. Alert and oriented x 3. Normal mood.  Objective: Vitals:   09/06/20 0500 09/06/20 0957 09/06/20 2038 09/07/20 0457  BP:  135/76 (!) 141/85 (!) 154/91  Pulse:  65 74 77  Resp:  18 18 18   Temp:  98 F (36.7 C) 99.9 F (37.7 C) (!) 101.2 F (38.4 C)  TempSrc:  Oral Oral Oral  SpO2:  93% 98% 97%  Weight: 83.8 kg     Height:        Intake/Output Summary (Last 24 hours) at 09/07/2020 0723 Last data filed at 09/07/2020 0400 Gross per 24 hour  Intake 240 ml  Output 800 ml  Net -560 ml   Filed Weights   09/03/20 2147 09/04/20 0339 09/06/20 0500  Weight: 89 kg 89 kg 83.8 kg     Data Reviewed:   CBC: Recent Labs  Lab 09/03/20 1623 09/03/20 1803 09/03/20 2009 09/04/20 0348 09/06/20 0500 09/07/20 0336  WBC 8.2  --  6.7 7.4 5.0 3.3*  NEUTROABS 6.4  --   --   --   --   --   HGB 12.7* 13.3 11.5* 11.9* 12.2* 11.8*  HCT 41.8 39.0 38.5* 37.4* 35.9* 34.8*  MCV 105.3*  --  104.3* 100.8* 93.2 93.5  PLT 208  --  163 135* 100* 91*   Basic Metabolic Panel: Recent Labs  Lab 09/03/20 1623 09/03/20 1803 09/03/20 2009 09/04/20 0348 09/06/20 0500  NA 139  139  --  140 140  K 5.7* 5.4*  --  4.4 3.0*  CL 108  --   --  112* 109  CO2 13*  --   --  17* 19*  GLUCOSE 73  --   --  117* 91  BUN 21  --   --  26* 16  CREATININE 2.45*  --   --  2.30* 2.02*  CALCIUM 8.6*  --   --  7.8* 8.0*  MG  --   --  1.6* 1.4* 1.7  PHOS  --   --  6.3* 3.7  --    GFR: Estimated Creatinine Clearance: 37.5 mL/min (A) (by C-G formula based on SCr of 2.02 mg/dL (H)). Liver Function Tests: Recent Labs  Lab 09/03/20 1623 09/04/20 0348 09/06/20 0500  AST 502* 974* 8,396*  ALT 147* 203* 2,465*  ALKPHOS 97 66 69  BILITOT 0.8 0.7 1.2  PROT 7.0 5.6* 6.2*  ALBUMIN 3.6 2.9* 2.9*   No results for input(s): LIPASE, AMYLASE in the last 168 hours. No results for input(s): AMMONIA in the last 168 hours. Coagulation Profile: Recent Labs  Lab 09/03/20 2009 09/06/20 1158  INR 1.4* 1.2   Cardiac Enzymes: Recent Labs  Lab 09/03/20 2009 09/04/20 0348 09/05/20 0815 09/06/20 0500  CKTOTAL 367 674* 922* 725*   BNP (last 3 results) No results for input(s): PROBNP in the last 8760 hours. HbA1C: No results for input(s): HGBA1C in the last 72 hours. CBG: Recent Labs  Lab 09/06/20 0444 09/06/20 0954 09/06/20 1338 09/06/20 1637 09/06/20 2007  GLUCAP 126* 160* 116* 148* 113*   Lipid Profile: No results for input(s): CHOL, HDL, LDLCALC, TRIG, CHOLHDL, LDLDIRECT in the last 72 hours. Thyroid Function Tests: Recent Labs    09/05/20 0815  TSH 1.007   Anemia Panel: Recent Labs    09/05/20 0815  VITAMINB12 1,455*  FERRITIN 4,797*  TIBC 258  IRON 20*   Sepsis Labs: Recent Labs  Lab 09/03/20 1705 09/03/20 2009 09/04/20 0348  PROCALCITON  --  3.60  --   LATICACIDVEN 3.0*  --  2.6*    Recent Results (from the past 240 hour(s))  Urine culture     Status: Abnormal   Collection Time: 09/03/20  5:05 PM   Specimen: Urine, Random  Result Value Ref Range Status   Specimen Description URINE, RANDOM  Final   Special Requests NONE  Final   Culture (A)   Final    <10,000 COLONIES/mL INSIGNIFICANT GROWTH Performed at [facility], [place] [address], [place],  [address]    Report Status 09/05/2020 FINAL  Final  Resp Panel by RT-PCR (Flu A&B, Covid) Nasopharyngeal Swab     Status: Abnormal   Collection Time: 09/03/20  5:05 PM   Specimen: Nasopharyngeal Swab; Nasopharyngeal(NP) swabs in vial transport medium  Result Value Ref Range Status   SARS Coronavirus 2 by RT PCR POSITIVE (A) NEGATIVE Final    Comment: RESULT CALLED TO, READ BACK BY AND VERIFIED WITH: A BANKS RN 1841 09/03/20 A BROWNING (NOTE) SARS-CoV-2 target nucleic acids are DETECTED.  The SARS-CoV-2 RNA is generally detectable in upper respiratory specimens during the acute phase of infection. Positive results are indicative of the presence of the identified virus, but do not rule out bacterial infection or co-infection with other pathogens not detected by the test. Clinical correlation with patient history and other diagnostic information is necessary to determine patient infection status. The expected result is Negative.  Fact Sheet for Patients: EntrepreneurPulse.com.au  Fact Sheet for Healthcare Providers: IncredibleEmployment.be  This test is not yet approved or cleared by the [place] FDA and  has been authorized for detection and/or diagnosis of SARS-CoV-2 by FDA under an Emergency Use Authorization (EUA).  This EUA will remain in effect (meaning this test can b e used) for the duration of  the COVID-19 declaration under Section 564(b)(1) of the Act, 21 U.S.C. section 360bbb-3(b)(1), unless the authorization is terminated or revoked sooner.     Influenza A by PCR NEGATIVE NEGATIVE Final   Influenza B by PCR NEGATIVE NEGATIVE Final    Comment: (NOTE) The Xpert Xpress SARS-CoV-2/FLU/RSV plus assay is intended as an aid in the diagnosis of influenza from Nasopharyngeal swab specimens and should not be used as a  sole basis for treatment. Nasal washings and aspirates are unacceptable for Xpert Xpress SARS-CoV-2/FLU/RSV testing.  Fact Sheet for Patients: EntrepreneurPulse.com.au  Fact Sheet for Healthcare Providers: IncredibleEmployment.be  This test is not yet approved or cleared by the [place] FDA and has been authorized for detection and/or diagnosis of SARS-CoV-2 by FDA under an Emergency Use Authorization (EUA). This EUA will remain in effect (meaning  this test can be used) for the duration of the COVID-19 declaration under Section 564(b)(1) of the Act, 21 U.S.C. section 360bbb-3(b)(1), unless the authorization is terminated or revoked.  Performed at [facility], [place] [address], [place], [place] [address]   CSF culture     Status: None (Preliminary result)   Collection Time: 09/03/20  6:11 PM   Specimen: CSF; Cerebrospinal Fluid  Result Value Ref Range Status   Specimen Description CSF  Final   Special Requests NONE  Final   Gram Stain   Final    WBC PRESENT, PREDOMINANTLY MONONUCLEAR NO ORGANISMS SEEN CYTOSPIN SMEAR    Culture   Final    NO GROWTH 3 DAYS Performed at [facility], [place] [address] [place], [place] [address]    Report Status PENDING  Incomplete  Culture, blood (routine x 2)     Status: None (Preliminary result)   Collection Time: 09/03/20  8:12 PM   Specimen: BLOOD RIGHT HAND  Result Value Ref Range Status   Specimen Description BLOOD RIGHT HAND  Final   Special Requests   Final    BOTTLES DRAWN AEROBIC AND ANAEROBIC Blood Culture results may not be optimal due to an inadequate volume of blood received in culture bottles   Culture   Final    NO GROWTH 3 DAYS Performed at [facility], [place] [address], [place], [place] [address]    Report Status PENDING  Incomplete  Culture, blood (routine x 2)     Status: None (Preliminary result)   Collection Time: 09/04/20  3:48 AM   Specimen: BLOOD RIGHT HAND   Result Value Ref Range Status   Specimen Description BLOOD RIGHT HAND  Final   Special Requests   Final    BOTTLES DRAWN AEROBIC ONLY Blood Culture adequate volume   Culture   Final    NO GROWTH 2 DAYS Performed at [facility], [place] [address], [place], [place] [address]    Report Status PENDING  Incomplete  C Difficile Quick Screen w PCR reflex     Status: None   Collection Time: 09/05/20  2:11 PM   Specimen: STOOL  Result Value Ref Range Status   C Diff antigen NEGATIVE NEGATIVE Final   C Diff toxin NEGATIVE NEGATIVE Final   C Diff interpretation No C. difficile detected.  Final    Comment: Performed at [facility], [place] [address], [place], [place] [address]         Radiology Studies: ECHOCARDIOGRAM LIMITED  Result Date: 09/05/2020    ECHOCARDIOGRAM LIMITED REPORT   Patient Name:   MONTFORD BARSE Date of Exam: 09/05/2020 Medical Rec #:  [id number]      Height:       66.0 in Accession #:    [id number]     Weight:       196.2 lb Date of Birth:  07/11/1956      BSA:          1.984 m Patient Age:    64 years       BP:           181/89 mmHg Patient Gender: M              HR:           77 bpm. Exam Location:  Inpatient Procedure: Limited Echo, Limited Color Doppler and Cardiac Doppler Indications:    syncope 780.2  History:  Patient has prior history of Echocardiogram examinations, most                 recent 07/14/2018. Covid, Arrythmias:RBBB; Risk                 Factors:Hypertension and Diabetes.  Sonographer:    Johny Chess Referring Phys: [id number] Tasnim Balentine CHIRAG Aishani Kalis IMPRESSIONS  1. Left ventricular ejection fraction, by estimation, is 60 to 65%. The left ventricle has normal function. Left ventricular diastolic parameters are consistent with Grade I diastolic dysfunction (impaired relaxation).  2. The mitral valve is normal in structure. Mild mitral valve regurgitation. No evidence of mitral stenosis.  3. The aortic valve is tricuspid. Aortic valve regurgitation is  not visualized. No aortic stenosis is present.  4. There is normal pulmonary artery systolic pressure.  5. The inferior vena cava is dilated in size with >50% respiratory variability, suggesting right atrial pressure of 8 mmHg. FINDINGS  Left Ventricle: Left ventricular ejection fraction, by estimation, is 60 to 65%. The left ventricle has normal function. Left ventricular diastolic parameters are consistent with Grade I diastolic dysfunction (impaired relaxation). Normal left ventricular filling pressure. Right Ventricle: There is normal pulmonary artery systolic pressure. The tricuspid regurgitant velocity is 2.50 m/s, and with an assumed right atrial pressure of 8 mmHg, the estimated right ventricular systolic pressure is 123456 mmHg. Mitral Valve: The mitral valve is normal in structure. Mild mitral valve regurgitation. No evidence of mitral valve stenosis. Tricuspid Valve: The tricuspid valve is normal in structure. Tricuspid valve regurgitation is mild . No evidence of tricuspid stenosis. Aortic Valve: The aortic valve is tricuspid. Aortic valve regurgitation is not visualized. No aortic stenosis is present. Pulmonic Valve: The pulmonic valve was normal in structure. Pulmonic valve regurgitation is trivial. No evidence of pulmonic stenosis. Aorta: The aortic root is normal in size and structure. Venous: The inferior vena cava is dilated in size with greater than 50% respiratory variability, suggesting right atrial pressure of 8 mmHg. LEFT VENTRICLE PLAX 2D LVIDd:         4.90 cm  Diastology LVIDs:         3.30 cm  LV e' medial:    8.92 cm/s LV PW:         1.10 cm  LV E/e' medial:  8.2 LV IVS:        0.90 cm  LV e' lateral:   11.30 cm/s LVOT diam:     2.10 cm  LV E/e' lateral: 6.4 LV SV:         101 LV SV Index:   51 LVOT Area:     3.46 cm  IVC IVC diam: 2.50 cm LEFT ATRIUM         Index LA diam:    4.00 cm 2.02 cm/m  AORTIC VALVE LVOT Vmax:   146.00 cm/s LVOT Vmean:  96.800 cm/s LVOT VTI:    0.292 m  AORTA Ao  Root diam: 3.30 cm Ao Asc diam:  3.30 cm MITRAL VALVE               TRICUSPID VALVE MV Area (PHT): 3.03 cm    TR Peak grad:   25.0 mmHg MV Decel Time: 250 msec    TR Vmax:        250.00 cm/s MV E velocity: 72.80 cm/s MV A velocity: 96.00 cm/s  SHUNTS MV E/A ratio:  0.76        Systemic VTI:  0.29 m  Systemic Diam: 2.10 cm Skeet Latch MD Electronically signed by Skeet Latch MD Signature Date/Time: 09/05/2020/2:37:24 PM    Final    US Abdomen Limited RUQ (LIVER/GB)  Result Date: 09/05/2020 CLINICAL DATA:  Transaminitis Hepatitis C Hypertension Type 2 diabetes EXAM: ULTRASOUND ABDOMEN LIMITED RIGHT UPPER QUADRANT COMPARISON:  CT abdomen pelvis 06/19/2019 FINDINGS: Gallbladder: Surgically absent Common bile duct: Diameter: 2 mm Liver: No focal lesion identified. Within normal limits in parenchymal echogenicity. Portal vein is patent on color Doppler imaging with normal direction of blood flow towards the liver. Other: None. IMPRESSION: No significant abnormality of the liver. Electronically Signed   By: Miachel Roux M.D.   On: 09/05/2020 11:14        Scheduled Meds: . amLODipine  5 mg Oral Daily  . aspirin EC  81 mg Oral Daily  . Chlorhexidine Gluconate Cloth  6 each Topical Daily  . docusate  100 mg Per Tube BID  . folic acid  1 mg Per Tube Daily  . heparin  5,000 Units Subcutaneous Q8H  . insulin aspart  0-9 Units Subcutaneous Q4H  . metoprolol tartrate  25 mg Oral BID  . multivitamin with minerals  1 tablet Per Tube Daily  . pantoprazole sodium  40 mg Per Tube QHS  . polyethylene glycol  17 g Per Tube Daily  . thiamine  100 mg Per Tube Daily   Continuous Infusions: . ampicillin-sulbactam (UNASYN) IV 3 g (09/07/20 0459)     LOS: 4 days   Time spent= 35 mins    Anushree Dorsi Arsenio Loader, MD [facility]  If 7PM-7AM, please contact night-coverage  09/07/2020, 7:23 AM

## 2020-09-07 NOTE — Evaluation (Signed)
Physical Therapy Evaluation Patient Details Name: Todd [name]. MRN: [id number] DOB: 1955-12-23 Today's Date: 09/07/2020   History of Present Illness  65 year old with history of DM2, HTN, CKD, alcohol/tobacco and heroin abuse, Hepatitis C presented 09/03/20 from homeless shelter when he was found down and intubated in the ER.  There was suspicion for seizure-like activity after intubation. UDS negative.   CT head showed bilateral chronic infarct, moderate cerebellar disease but nothing acute.  EEG showed diffuse encephalopathy.  LP was negative for meningitis; Also found to have COVID-19.+aspiration pneumonia;  Clinical Impression   Pt admitted with above diagnosis. Per patient, staying at [facility] for ambulation PTA. Patient demonstrates overall good LE strength in supine, however refused to attempt sitting EOB or OOB due to pain (bil Knee pain). He clearly expressed he wished he had a rollator instead of a 2 wheel walker and agreed he would work with PT on whether he can be safe with a rollator.  Pt currently with functional limitations due to the deficits listed below (see PT Problem List). Pt will benefit from skilled PT to increase their independence and safety with mobility to allow discharge to the venue listed below.       Follow Up Recommendations Other (comment) (TBA as pt agrees to mobilize)    Equipment Recommendations  Other (comment) (possible rollator-TBA)    Recommendations for Other Services OT consult     Precautions / Restrictions Precautions Precautions: Fall      Mobility  Bed Mobility               General bed mobility comments: pt refused OOB, able to scoot to [facility] with bed flat i'ly    Transfers                 General transfer comment: pt refused  Ambulation/Gait                Stairs            Wheelchair Mobility    Modified Rankin (Stroke Patients Only)       Balance                                              Pertinent Vitals/Pain Pain Assessment: Faces Faces Pain Scale: Hurts whole lot Pain Location: bil knees Pain Descriptors / Indicators: Aching Pain Intervention(s): Limited activity within patient's tolerance    Home Living Family/patient expects to be discharged to:: Shelter/Homeless                 Additional Comments: [facility]--reports level entry to all areas including shower    Prior Function Level of Independence: Independent with assistive device(s)         Comments: uses RW but interested in a rollator     Hand Dominance        Extremity/Trunk Assessment   Upper Extremity Assessment Upper Extremity Assessment: Defer to OT evaluation    Lower Extremity Assessment Lower Extremity Assessment: Generalized weakness (can do a full bridge x 3 sec)       Communication   Communication: No difficulties  Cognition Arousal/Alertness: Awake/alert Behavior During Therapy: Agitated (easily ittitated) Overall Cognitive Status: No family/caregiver present to determine baseline cognitive functioning  General Comments: pt conversationally appears cognitively intact for current situation      General Comments General comments (skin integrity, edema, etc.): Pt refused to even sit EOB. Reports he wants to try a rollator as it would be more conducive to living in [employer].    Exercises     Assessment/Plan    PT Assessment Patient needs continued PT services  PT Problem List Decreased strength;Decreased activity tolerance;Decreased balance;Decreased mobility;Decreased knowledge of use of DME;Pain       PT Treatment Interventions DME instruction;Gait training;Functional mobility training;Therapeutic activities;Therapeutic exercise;Patient/family education    PT Goals (Current goals can be found in the Care Plan section)  Acute Rehab PT Goals Patient  Stated Goal: to get a rollator PT Goal Formulation: With patient Time For Goal Achievement: 09/21/20 Potential to Achieve Goals: Fair    Frequency Min 3X/week   Barriers to discharge Other (comment) ? still has a spot at [facility] "6 Clicks" Mobility  Outcome Measure Help needed turning from your back to your side while in a flat bed without using bedrails?: None Help needed moving from lying on your back to sitting on the side of a flat bed without using bedrails?: A Little Help needed moving to and from a bed to a chair (including a wheelchair)?: A Lot Help needed standing up from a chair using your arms (e.g., wheelchair or bedside chair)?: A Lot Help needed to walk in hospital room?: A Lot Help needed climbing 3-5 steps with a railing? : Total 6 Click Score: 14    End of Session   Activity Tolerance: Patient limited by pain Patient left: in bed;with call bell/phone within reach;with bed alarm set   PT Visit Diagnosis: Muscle weakness (generalized) (M62.81);Other abnormalities of gait and mobility (R26.89);Pain Pain - Right/Left:  (bil) Pain - part of body: Knee    Time: 9030-0923 PT Time Calculation (min) (ACUTE ONLY): 20 min   Charges:   PT Evaluation $PT Eval Low Complexity: 1 Low           Arby Barrette, PT Pager [phone]   [name] 09/07/2020, 3:30 PM

## 2020-09-08 DIAGNOSIS — J9601 Acute respiratory failure with hypoxia: Secondary | ICD-10-CM | POA: Diagnosis not present

## 2020-09-08 DIAGNOSIS — R7401 Elevation of levels of liver transaminase levels: Secondary | ICD-10-CM | POA: Diagnosis not present

## 2020-09-08 LAB — COMPREHENSIVE METABOLIC PANEL
ALT: 1025 U/L — ABNORMAL HIGH (ref 0–44)
AST: 511 U/L — ABNORMAL HIGH (ref 15–41)
Albumin: 2.9 g/dL — ABNORMAL LOW (ref 3.5–5.0)
Alkaline Phosphatase: 65 U/L (ref 38–126)
Anion gap: 11 (ref 5–15)
BUN: 13 mg/dL (ref 8–23)
CO2: 16 mmol/L — ABNORMAL LOW (ref 22–32)
Calcium: 8.1 mg/dL — ABNORMAL LOW (ref 8.9–10.3)
Chloride: 109 mmol/L (ref 98–111)
Creatinine, Ser: 1.7 mg/dL — ABNORMAL HIGH (ref 0.61–1.24)
GFR, Estimated: 44 mL/min — ABNORMAL LOW (ref >60)
Glucose, Bld: 101 mg/dL — ABNORMAL HIGH (ref 70–99)
Potassium: 4.1 mmol/L (ref 3.5–5.1)
Sodium: 136 mmol/L (ref 135–145)
Total Bilirubin: 1.2 mg/dL (ref 0.3–1.2)
Total Protein: 6.6 g/dL (ref 6.5–8.1)

## 2020-09-08 LAB — BRAIN NATRIURETIC PEPTIDE: B Natriuretic Peptide: 324.2 pg/mL — ABNORMAL HIGH (ref 0.0–100.0)

## 2020-09-08 LAB — CBC
HCT: 38.3 % — ABNORMAL LOW (ref 39.0–52.0)
Hemoglobin: 12.5 g/dL — ABNORMAL LOW (ref 13.0–17.0)
MCH: 30.6 pg (ref 26.0–34.0)
MCHC: 32.6 g/dL (ref 30.0–36.0)
MCV: 93.9 fL (ref 80.0–100.0)
Platelets: 79 10*3/uL — ABNORMAL LOW (ref 150–400)
RBC: 4.08 MIL/uL — ABNORMAL LOW (ref 4.22–5.81)
RDW: 14.7 % (ref 11.5–15.5)
WBC: 3.3 10*3/uL — ABNORMAL LOW (ref 4.0–10.5)
nRBC: 0 % (ref 0.0–0.2)

## 2020-09-08 LAB — MAGNESIUM: Magnesium: 1.8 mg/dL (ref 1.7–2.4)

## 2020-09-08 LAB — CULTURE, BLOOD (ROUTINE X 2): Culture: NO GROWTH

## 2020-09-08 LAB — CK: Total CK: 181 U/L (ref 49–397)

## 2020-09-08 LAB — FOLATE RBC
Folate, Hemolysate: 327 ng/mL
Folate, RBC: 962 ng/mL (ref >498)
Hematocrit: 34 % — ABNORMAL LOW (ref 37.5–51.0)

## 2020-09-08 LAB — GLUCOSE, CAPILLARY
Glucose-Capillary: 102 mg/dL — ABNORMAL HIGH (ref 70–99)
Glucose-Capillary: 103 mg/dL — ABNORMAL HIGH (ref 70–99)
Glucose-Capillary: 112 mg/dL — ABNORMAL HIGH (ref 70–99)
Glucose-Capillary: 114 mg/dL — ABNORMAL HIGH (ref 70–99)
Glucose-Capillary: 125 mg/dL — ABNORMAL HIGH (ref 70–99)
Glucose-Capillary: 80 mg/dL (ref 70–99)
Glucose-Capillary: 97 mg/dL (ref 70–99)
Glucose-Capillary: 99 mg/dL (ref 70–99)

## 2020-09-08 MED ORDER — SODIUM CHLORIDE 0.9 % IV SOLN
3.0000 g | Freq: Three times a day (TID) | INTRAVENOUS | Status: AC
  Administered 2020-09-08 – 2020-09-09 (×4): 3 g via INTRAVENOUS
  Filled 2020-09-08: qty 8
  Filled 2020-09-08 (×2): qty 3
  Filled 2020-09-08: qty 8

## 2020-09-08 MED ORDER — LOPERAMIDE HCL 2 MG PO CAPS
2.0000 mg | ORAL_CAPSULE | Freq: Two times a day (BID) | ORAL | Status: AC | PRN
  Administered 2020-09-08 (×2): 2 mg via ORAL
  Filled 2020-09-08 (×2): qty 1

## 2020-09-08 NOTE — Evaluation (Signed)
Occupational Therapy Evaluation Patient Details Name: Todd [name]. MRN: [id number] DOB: [birth year] Today's Date: 09/08/2020    History of Present Illness 65 year old with history of DM2, HTN, CKD, alcohol/tobacco and heroin abuse, Hepatitis C presented 09/03/20 from homeless shelter when he was found down and intubated in the ER.  There was suspicion for seizure-like activity after intubation. UDS negative.   CT head showed bilateral chronic infarct, moderate cerebellar disease but nothing acute.  EEG showed diffuse encephalopathy.  LP was negative for meningitis; Also found to have COVID-19.+aspiration pneumonia;   Clinical Impression   PTA, pt reports staying at [facility]. Pt tells this OT that he was Independent with ADLs and mobility without AD, but reported using RW on PT eval. Pt with overall decreased motivation for OOB/ADL activities but reports desire to trial Rollator. Pt able to return demo appropriate locking/unlocking of brakes with cues but needs continued training. Pt overall Min A for bed mobility, Min A for mobility in room with Rollator. Session limited due to urine incontinence and rectal tube becoming dislodged - pt Total A for cleanup. Pt requires Mod A for UB ADLs, up to Total A for LB ADLs due to deficits. Further cognitive assessment indicated as appropriate. Pt on RA, endorses fatigue but no dyspnea noted. At this time, recommend SNF for short term rehab. Will continue to follow and update accordingly.     Follow Up Recommendations  SNF;Supervision/Assistance - 24 hour    Equipment Recommendations  Other (comment) (Rollator - needs further training first)    Recommendations for Other Services       Precautions / Restrictions Precautions Precautions: Fall;Other (comment) Precaution Comments: rectal tube Restrictions Weight Bearing Restrictions: No      Mobility Bed Mobility Overal bed mobility: Needs Assistance Bed Mobility: Supine to Sit;Sit  to Supine     Supine to sit: Min assist;HOB elevated Sit to supine: Min guard   General bed mobility comments: Min A with handheld assist to sit up, Min guard for return back to bed to ensure LE clearance    Transfers Overall transfer level: Needs assistance Equipment used: 4-wheeled walker Transfers: Sit to/from Stand Sit to Stand: Min assist         General transfer comment: MIn A for power up/stability with Rollator. Educated on locking brakes when sitting/standing with pt able to return demo but will need reinforcement. Pt Min A for mobility in room with Rollator for stability and cues for directions    Balance Overall balance assessment: Needs assistance Sitting-balance support: No upper extremity supported;Feet supported Sitting balance-Leahy Scale: Fair     Standing balance support: Single extremity supported;Bilateral upper extremity supported Standing balance-Leahy Scale: Poor Standing balance comment: reliant on at least one UE support                           ADL either performed or assessed with clinical judgement   ADL Overall ADL's : Needs assistance/impaired Eating/Feeding: Set up;Sitting Eating/Feeding Details (indicate cue type and reason): setup for lunch in bed Grooming: Set up;Sitting   Upper Body Bathing: Sitting;Minimal assistance   Lower Body Bathing: Maximal assistance;Sit to/from stand   Upper Body Dressing : Moderate assistance;Bed level Upper Body Dressing Details (indicate cue type and reason): Mod A to don clean gown in bed, decreased initiation Lower Body Dressing: Maximal assistance;Sit to/from stand Lower Body Dressing Details (indicate cue type and reason): Max A for donning/doffing socks. Reports too much LE  pain and requested OT to do this task Toilet Transfer: Minimal assistance;Ambulation Toilet Transfer Details (indicate cue type and reason): simulated in room with rollator trial Toileting- Clothing Manipulation and  Hygiene: Total assistance;Sit to/from stand Toileting - Clothing Manipulation Details (indicate cue type and reason): Pt experiencing urine incontinence and rectal tube popped out during mobility. Total A for cleanup     Functional mobility during ADLs: Minimal assistance General ADL Comments: Pt with deficits in standing balance, endurance and reported pain in B LE. Pt also noted with decreased motivation for participation and episodes of incontinence limiting session     Vision Patient Visual Report: No change from baseline Vision Assessment?: No apparent visual deficits     Perception     Praxis      Pertinent Vitals/Pain Pain Assessment: Faces Faces Pain Scale: Hurts even more Pain Location: B LE, cramp in neck Pain Descriptors / Indicators: Aching Pain Intervention(s): Monitored during session;Repositioned     Hand Dominance Right   Extremity/Trunk Assessment Upper Extremity Assessment Upper Extremity Assessment: Generalized weakness   Lower Extremity Assessment Lower Extremity Assessment: Defer to PT evaluation   Cervical / Trunk Assessment Cervical / Trunk Assessment: Kyphotic   Communication Communication Communication: No difficulties   Cognition Arousal/Alertness: Awake/alert Behavior During Therapy: Flat affect Overall Cognitive Status: No family/caregiver present to determine baseline cognitive functioning Area of Impairment: Orientation;Attention;Memory;Following commands;Safety/judgement;Awareness;Problem solving                 Orientation Level: Situation Current Attention Level: Selective Memory: Decreased short-term memory Following Commands: Follows one step commands with increased time Safety/Judgement: Decreased awareness of deficits;Decreased awareness of safety Awareness: Emergent Problem Solving: Slow processing;Difficulty sequencing;Requires verbal cues General Comments: Pt able to follow commands, varying reports of PLOF for OT vs  with PT eval. Pt with decreased awareness of deficits and safety techniques. Agreeable to participate with coaxing but flat affect. Noted with urine incontinence (reports missing part of prostate) and inquired if he wore depends, pt reports "I will now". Unsure if cognition baseline, will need further assessment   General Comments  Pt received on RA, no O2 monitoring hooked up to pt but does endorse fatigue after mobility. Session limited by urine incontinence and rectal tube popping out. RN present at end of session and assisting    Exercises     Shoulder Instructions      Home Living Family/patient expects to be discharged to:: Shelter/Homeless                                 Additional Comments: [facility] shelter--reports level entry to all areas including shower      Prior Functioning/Environment Level of Independence: Independent        Comments: Pt reports Independent in ADLs and mobility without AD to OT. Noted he told PT that he used RW, but interested in Rollator        OT Problem List: Impaired balance (sitting and/or standing);Decreased activity tolerance;Decreased strength;Decreased safety awareness;Decreased knowledge of use of DME or AE      OT Treatment/Interventions: Self-care/ADL training;Therapeutic exercise;Energy conservation;DME and/or AE instruction;Therapeutic activities;Patient/family education;Balance training    OT Goals(Current goals can be found in the care plan section) Acute Rehab OT Goals Patient Stated Goal: to get a rollator OT Goal Formulation: With patient Time For Goal Achievement: 09/22/20 Potential to Achieve Goals: Good ADL Goals Pt Will Perform Grooming: with modified independence;standing Pt Will Perform  Lower Body Bathing: with modified independence;sitting/lateral leans;sit to/from stand Pt Will Perform Lower Body Dressing: with modified independence;sitting/lateral leans;sit to/from stand Pt Will Transfer to  Toilet: with modified independence;ambulating Pt Will Perform Toileting - Clothing Manipulation and hygiene: with modified independence;sitting/lateral leans;sit to/from stand  OT Frequency: Min 2X/week   Barriers to D/C:            Co-evaluation              AM-PAC OT "6 Clicks" Daily Activity     Outcome Measure Help from another person eating meals?: A Little Help from another person taking care of personal grooming?: A Little Help from another person toileting, which includes using toliet, bedpan, or urinal?: Total Help from another person bathing (including washing, rinsing, drying)?: A Lot Help from another person to put on and taking off regular upper body clothing?: A Lot Help from another person to put on and taking off regular lower body clothing?: A Lot 6 Click Score: 13   End of Session Equipment Utilized During Treatment: Other (comment) Agricultural consultant) Nurse Communication: Mobility status;Other (comment) (rectal tube)  Activity Tolerance: Patient limited by fatigue;Other (comment) (limited by incontinence) Patient left: in bed;with call bell/phone within reach;with bed alarm set  OT Visit Diagnosis: Unsteadiness on feet (R26.81);Other abnormalities of gait and mobility (R26.89);Muscle weakness (generalized) (M62.81);Other symptoms and signs involving cognitive function                Time: 6203-5597 OT Time Calculation (min): 34 min Charges:  OT General Charges $OT Visit: 1 Visit OT Evaluation $OT Eval Moderate Complexity: 1 Mod OT Treatments $Self Care/Home Management : 8-22 mins  Layla Maw, OTR/L  Layla Maw 09/08/2020, 2:12 PM

## 2020-09-08 NOTE — Progress Notes (Signed)
-  Chart reviewed.  Patient not seen.  Labs reviewed.  Discussed with hospitalist. -65 year old patient with multiple medical issues including COVID-19 and aspiration pneumonia, history of substance abuse, history of hepatitis C was seen by GI for evaluation of abnormal LFTs.  -HCV PCR negative 09/05/2020.  Hepatitis B surface antigen negative and hepatitis A IgM negative.   LFTs trending down.  Platelets also trending down.  Assessment ------------------ -Abnormal LFTs.  Most likely ischemic.  Improving. -COVID-19 and aspiration pneumonia -Altered mental status.  Improving -History of substance abuse -Acute kidney injury.  Improving -Diarrhea.  C. difficile negative.  Recommendations -------------------------- -Continue supportive care -Repeat labs in the morning/CBC, LFts and INR -GI will follow from distance.  Discussed with hospitalist  Otis Brace MD, [place] 09/08/2020, 8:33 AM  Contact #  [phone]

## 2020-09-08 NOTE — TOC Progression Note (Signed)
Transition of Care (TOC) - Progression Note    Patient Details  Name: Todd [name]. MRN: [id number] Date of Birth: January 31, 1956  Transition of Care [facility]) CM/SW [place], RN Phone Number: [phone]  09/08/2020, 2:10 PM  Clinical Narrative:    [facility] consulted for substance abuse counseling. CM has been unable to reach patient via cell phone or room phone. Message has been left on patients cell phone. Unable to completed [facility] at this time. Will attempt at a later when patient is available.          Expected Discharge Plan and Services                                                 Social Determinants of Health (SDOH) Interventions    Readmission Risk Interventions No flowsheet data found.

## 2020-09-08 NOTE — Care Management Important Message (Signed)
Important Message  Patient Details  Name: Todd [name]. MRN: [id number] Date of Birth: 10-23-1955   Medicare Important Message Given:  Yes - Important Message mailed due to current National Emergency   Verbal consent obtained due to current National Emergency  Relationship to patient: Self Contact Name: Todd [name] Call Date: 09/08/20  Time: 1448 Phone: [phone] Outcome: Spoke with contact Important Message mailed to: Patient address on file    [name] 09/08/2020, 2:49 PM

## 2020-09-08 NOTE — Progress Notes (Signed)
PROGRESS NOTE    Todd [name].  SS:[id number] DOB: Oct 31, 1955 DOA: 09/03/2020 PCP: [name], MD   Brief Narrative:  65 year old with history of DM2, HTN, CKD, alcohol/tobacco and heroin abuse presented from homeless shelter when he was found down and intubated in the ER.  There was suspicion for seizure-like activity after intubation.  Also found to have COVID-19.  CT head showed bilateral chronic infarct, moderate cerebellar disease but nothing acute.  EEG showed diffuse encephalopathy.  LP was negative for meningitis.  Initially received vancomycin, Zosyn and acyclovir which was later transitioned to Unasyn for aspiration pneumonia.  Seen by cardiology due to elevated troponin.  Also seen by GI due to severe transaminitis, currently advising to monitor.  LFTs are improving.  PT consulted.   Assessment & Plan:   Active Problems:   Acute respiratory failure with hypoxia (HCC)  Acute hypoxic respiratory failure secondary to COVID-19 infection and aspiration pneumonia - Currently on room air.  Extubated 1/20.  Continue bronchodilators -Continue IV Unasyn for aspiration pneumonia.  Tentative last day 1/26, changed to Augmentin at discharge sooner - Procalcitonin 3.6 -Speech and swallow-dysphagia 3 diet - Incentive spirometer, flutter valve  Unresponsive concerns of acute meningitis/encephalitis - LP negative.  Vancomycin, Zosyn and acyclovir discontinued.  CSF cultures negative. - CT head- negative for acute changes but does have chronic disease - EEG-diffuse encephalopathy, no seizures - UDS-negative -Echocardiogram -echocardiogram EF 65%, grade 1 DD  Elevated Troponins -Chest pain-free.  EKG at baseline shows bundle branch block.  Echocardiogram EF 65%, grade 1 DD.  Troponins elevated but flat.  Seen by cardiology, outpatient ischemic eval  AKI on CKD stage IIIa - Admission creatinine 2.4.  Improving.  Morning creatinine 1.7  Transaminitis, alcohol pattern.   Improving History of hepatitis C - Hep C antibodies positive, hep C viral load- undetected - Shock liver versus ischemic hepatitis versus alcoholic hepatitis. - Seen by [facility], continue to monitor -HIV-negative - Abdominal ultrasound-unremarkable -Monitor for signs of withdrawal  Mild rhabdomyolysis -Resolved with IV fluids  Diarrhea, nonspecific - C. difficile-negative.  Could be related to COVID-19 infection  Macrocytic anemia - Suspect from underlying alcohol use - B12, TSH-normal.   - Iron studies shows low saturation, elevated ferritin.  History of esophagitis, ulcerative - Continue PPI  Polysubstance abuse - Will need counseling to quit using this  History of DVT PE LLE, Chronic - Used to be on anticoagulation?Marland Kitchen  Ultrasound Dopplers discussed that shows chronic DVT.  Previously is completed course for Coumadin - Has history of laser ablation of his DVT?Marland Kitchen  Followed [facility].  Hx of dilated aorta Seen by CT surgery October 2019.  Discussed blood pressure control and follow-up in 2 years with CTA chest.  This can be done outpatient upon discharge  PT/OT-skilled PT  DVT prophylaxis: Subcu heparin Code Status: Full code Family Communication:    Status is: Inpatient  Remains inpatient appropriate because:IV treatments appropriate due to intensity of illness or inability to take PO   Dispo: The patient is from: Home              Anticipated d/c is to: Home              Anticipated d/c date is: 1-2 days              Patient currently is not medically stable to d/c.  Transaminitis is improving, PT continues to work with him.  TOC to help with safe disposition in the meantime  Body mass  index is 29.82 kg/m.     Subjective: Had fever very early morning yesterday. Feels okay no complaints  Review of Systems Otherwise negative except as per HPI, including:  General: Denies fever, chills, night sweats or unintended weight loss. Resp: Denies cough, wheezing, shortness  of breath. Cardiac: Denies chest pain, palpitations, orthopnea, paroxysmal nocturnal dyspnea. GI: Denies abdominal pain, nausea, vomiting, diarrhea or constipation GU: Denies dysuria, frequency, hesitancy or incontinence MS: Denies muscle aches, joint pain or swelling Neuro: Denies headache, neurologic deficits (focal weakness, numbness, tingling), abnormal gait Psych: Denies anxiety, depression, SI/HI/AVH Skin: Denies new rashes or lesions ID: Denies sick contacts, exotic exposures, travel  Examination: Constitutional: Not in acute distress Respiratory: Clear to auscultation bilaterally Cardiovascular: Normal sinus rhythm, no rubs Abdomen: Nontender nondistended good bowel sounds Musculoskeletal: No edema noted Skin: No rashes seen Neurologic: CN 2-12 grossly intact.  And nonfocal Psychiatric: Normal judgment and insight. Alert and oriented x 3. Normal mood.  Objective: Vitals:   09/07/20 0457 09/07/20 1034 09/07/20 2002 09/08/20 0500  BP: (!) 154/91 (!) 151/82 (!) 158/92 140/86  Pulse: 77 70 70 72  Resp: 18 18 18 16   Temp: (!) 101.2 F (38.4 C) 99 F (37.2 C) 98 F (36.7 C) 98 F (36.7 C)  TempSrc: Oral  Oral Oral  SpO2: 97% 100% 99% 99%  Weight:      Height:        Intake/Output Summary (Last 24 hours) at 09/08/2020 0724 Last data filed at 09/07/2020 1400 Gross per 24 hour  Intake --  Output 675 ml  Net -675 ml   Filed Weights   09/03/20 2147 09/04/20 0339 09/06/20 0500  Weight: 89 kg 89 kg 83.8 kg     Data Reviewed:   CBC: Recent Labs  Lab 09/03/20 1623 09/03/20 1803 09/03/20 2009 09/04/20 0348 09/06/20 0500 09/07/20 0336 09/08/20 0452  WBC 8.2  --  6.7 7.4 5.0 3.3* 3.3*  NEUTROABS 6.4  --   --   --   --   --   --   HGB 12.7*   < > 11.5* 11.9* 12.2* 11.8* 12.5*  HCT 41.8   < > 38.5* 37.4* 35.9* 34.8* 38.3*  MCV 105.3*  --  104.3* 100.8* 93.2 93.5 93.9  PLT 208  --  163 135* 100* 91* 79*   < > = values in this interval not displayed.   Basic  Metabolic Panel: Recent Labs  Lab 09/03/20 1623 09/03/20 1803 09/03/20 2009 09/04/20 0348 09/06/20 0500 09/07/20 0336 09/08/20 0452  NA 139 139  --  140 140 138 136  K 5.7* 5.4*  --  4.4 3.0* 3.9 4.1  CL 108  --   --  112* 109 110 109  CO2 13*  --   --  17* 19* 18* 16*  GLUCOSE 73  --   --  117* 91 130* 101*  BUN 21  --   --  26* 16 15 13   CREATININE 2.45*  --   --  2.30* 2.02* 1.92* 1.70*  CALCIUM 8.6*  --   --  7.8* 8.0* 8.1* 8.1*  MG  --   --  1.6* 1.4* 1.7 2.2 1.8  PHOS  --   --  6.3* 3.7  --   --   --    GFR: Estimated Creatinine Clearance: 44.6 mL/min (A) (by C-G formula based on SCr of 1.7 mg/dL (H)). Liver Function Tests: Recent Labs  Lab 09/03/20 1623 09/04/20 0348 09/06/20 0500 09/07/20 [id number] 09/08/20 [id number]  AST 502* 974* 8,396* 1,670* 511*  ALT 147* 203* 2,465* 1,584* 1,025*  ALKPHOS 97 66 69 61 65  BILITOT 0.8 0.7 1.2 0.7 1.2  PROT 7.0 5.6* 6.2* 6.2* 6.6  ALBUMIN 3.6 2.9* 2.9* 2.7* 2.9*   No results for input(s): LIPASE, AMYLASE in the last 168 hours. No results for input(s): AMMONIA in the last 168 hours. Coagulation Profile: Recent Labs  Lab 09/03/20 2009 09/06/20 1158  INR 1.4* 1.2   Cardiac Enzymes: Recent Labs  Lab 09/04/20 0348 09/05/20 0815 09/06/20 0500 09/07/20 0336 09/08/20 0452  CKTOTAL 674* 922* 725* 347 181   BNP (last 3 results) No results for input(s): PROBNP in the last 8760 hours. HbA1C: No results for input(s): HGBA1C in the last 72 hours. CBG: Recent Labs  Lab 09/07/20 1220 09/07/20 1736 09/07/20 2005 09/08/20 0030 09/08/20 0408  GLUCAP 101* 101* 91 80  97 102*   Lipid Profile: No results for input(s): CHOL, HDL, LDLCALC, TRIG, CHOLHDL, LDLDIRECT in the last 72 hours. Thyroid Function Tests: Recent Labs    09/05/20 0815  TSH 1.007   Anemia Panel: Recent Labs    09/05/20 0815  VITAMINB12 1,455*  FERRITIN 4,797*  TIBC 258  IRON 20*   Sepsis Labs: Recent Labs  Lab 09/03/20 1705 09/03/20 2009  09/04/20 0348  PROCALCITON  --  3.60  --   LATICACIDVEN 3.0*  --  2.6*    Recent Results (from the past 240 hour(s))  Urine culture     Status: Abnormal   Collection Time: 09/03/20  5:05 PM   Specimen: Urine, Random  Result Value Ref Range Status   Specimen Description URINE, RANDOM  Final   Special Requests NONE  Final   Culture (A)  Final    <10,000 COLONIES/mL INSIGNIFICANT GROWTH Performed at [facility], [place] [address], [place], [place] [address]    Report Status 09/05/2020 FINAL  Final  Resp Panel by RT-PCR (Flu A&B, Covid) Nasopharyngeal Swab     Status: Abnormal   Collection Time: 09/03/20  5:05 PM   Specimen: Nasopharyngeal Swab; Nasopharyngeal(NP) swabs in vial transport medium  Result Value Ref Range Status   SARS Coronavirus 2 by RT PCR POSITIVE (A) NEGATIVE Final    Comment: RESULT CALLED TO, READ BACK BY AND VERIFIED WITH: A BANKS RN 1841 09/03/20 A BROWNING (NOTE) SARS-CoV-2 target nucleic acids are DETECTED.  The SARS-CoV-2 RNA is generally detectable in upper respiratory specimens during the acute phase of infection. Positive results are indicative of the presence of the identified virus, but do not rule out bacterial infection or co-infection with other pathogens not detected by the test. Clinical correlation with patient history and other diagnostic information is necessary to determine patient infection status. The expected result is Negative.  Fact Sheet for Patients: EntrepreneurPulse.com.au  Fact Sheet for Healthcare Providers: IncredibleEmployment.be  This test is not yet approved or cleared by the [place] FDA and  has been authorized for detection and/or diagnosis of SARS-CoV-2 by FDA under an Emergency Use Authorization (EUA).  This EUA will remain in effect (meaning this test can b e used) for the duration of  the COVID-19 declaration under Section 564(b)(1) of the Act, 21 U.S.C. section  360bbb-3(b)(1), unless the authorization is terminated or revoked sooner.     Influenza A by PCR NEGATIVE NEGATIVE Final   Influenza B by PCR NEGATIVE NEGATIVE Final    Comment: (NOTE) The Xpert Xpress SARS-CoV-2/FLU/RSV plus assay is intended as an aid in the diagnosis  of influenza from Nasopharyngeal swab specimens and should not be used as a sole basis for treatment. Nasal washings and aspirates are unacceptable for Xpert Xpress SARS-CoV-2/FLU/RSV testing.  Fact Sheet for Patients: BloggerCourse.com  Fact Sheet for Healthcare Providers: SeriousBroker.it  This test is not yet approved or cleared by the [place] FDA and has been authorized for detection and/or diagnosis of SARS-CoV-2 by FDA under an Emergency Use Authorization (EUA). This EUA will remain in effect (meaning this test can be used) for the duration of the COVID-19 declaration under Section 564(b)(1) of the Act, 21 U.S.C. section 360bbb-3(b)(1), unless the authorization is terminated or revoked.  Performed at [facility], [address] [place], [place] [address]   CSF culture     Status: None   Collection Time: 09/03/20  6:11 PM   Specimen: CSF; Cerebrospinal Fluid  Result Value Ref Range Status   Specimen Description CSF  Final   Special Requests NONE  Final   Gram Stain   Final    WBC PRESENT, PREDOMINANTLY MONONUCLEAR NO ORGANISMS SEEN CYTOSPIN SMEAR    Culture   Final    NO GROWTH Performed at [facility], [address], [place], [place] [address]    Report Status 09/07/2020 FINAL  Final  Culture, blood (routine x 2)     Status: None (Preliminary result)   Collection Time: 09/03/20  8:12 PM   Specimen: BLOOD RIGHT HAND  Result Value Ref Range Status   Specimen Description BLOOD RIGHT HAND  Final   Special Requests   Final    BOTTLES DRAWN AEROBIC AND ANAEROBIC Blood Culture results may not be optimal due to an inadequate volume  of blood received in culture bottles   Culture   Final    NO GROWTH 4 DAYS Performed at [facility], [address], [place], [place] [address]    Report Status PENDING  Incomplete  Culture, blood (routine x 2)     Status: None (Preliminary result)   Collection Time: 09/04/20  3:48 AM   Specimen: BLOOD RIGHT HAND  Result Value Ref Range Status   Specimen Description BLOOD RIGHT HAND  Final   Special Requests   Final    BOTTLES DRAWN AEROBIC ONLY Blood Culture adequate volume   Culture   Final    NO GROWTH 3 DAYS Performed at [facility], [address] [place], [place] [address]    Report Status PENDING  Incomplete  C Difficile Quick Screen w PCR reflex     Status: None   Collection Time: 09/05/20  2:11 PM   Specimen: STOOL  Result Value Ref Range Status   C Diff antigen NEGATIVE NEGATIVE Final   C Diff toxin NEGATIVE NEGATIVE Final   C Diff interpretation No C. difficile detected.  Final    Comment: Performed at [facility], [address] [place], [place] [address]         Radiology Studies: No results found.      Scheduled Meds: . amLODipine  5 mg Oral Daily  . aspirin EC  81 mg Oral Daily  . Chlorhexidine Gluconate Cloth  6 each Topical Daily  . docusate  100 mg Per Tube BID  . folic acid  1 mg Per Tube Daily  . heparin  5,000 Units Subcutaneous Q8H  . insulin aspart  0-9 Units Subcutaneous Q4H  . metoprolol tartrate  25 mg Oral BID  . multivitamin with minerals  1  tablet Per Tube Daily  . pantoprazole sodium  40 mg Per Tube QHS  . polyethylene glycol  17 g Per Tube Daily  . thiamine  100 mg Per Tube Daily   Continuous Infusions: . ampicillin-sulbactam (UNASYN) IV 3 g (09/08/20 0147)     LOS: 5 days   Time spent= 35 mins    Cordel Drewes Arsenio Loader, MD [facility]  If 7PM-7AM, please contact night-coverage  09/08/2020, 7:24 AM

## 2020-09-08 NOTE — Progress Notes (Signed)
PT Cancellation Note  Patient Details Name: Todd [name]. MRN: [id number] DOB: 12-30-1955   Cancelled Treatment:    Reason Eval/Treat Not Completed: Other (comment).  Refusing therapy but may be open to tx later.  Follow up as time and pt allow.   Ramond Dial 09/08/2020, 12:40 PM   Mee Hives, PT MS Acute Rehab Dept. Number: [place] and [place]

## 2020-09-09 DIAGNOSIS — J9601 Acute respiratory failure with hypoxia: Secondary | ICD-10-CM | POA: Diagnosis not present

## 2020-09-09 DIAGNOSIS — M6282 Rhabdomyolysis: Secondary | ICD-10-CM | POA: Diagnosis not present

## 2020-09-09 DIAGNOSIS — R7401 Elevation of levels of liver transaminase levels: Secondary | ICD-10-CM | POA: Diagnosis not present

## 2020-09-09 LAB — HEPATIC FUNCTION PANEL
ALT: 655 U/L — ABNORMAL HIGH (ref 0–44)
AST: 195 U/L — ABNORMAL HIGH (ref 15–41)
Albumin: 2.7 g/dL — ABNORMAL LOW (ref 3.5–5.0)
Alkaline Phosphatase: 56 U/L (ref 38–126)
Bilirubin, Direct: 0.1 mg/dL (ref 0.0–0.2)
Indirect Bilirubin: 0.5 mg/dL (ref 0.3–0.9)
Total Bilirubin: 0.6 mg/dL (ref 0.3–1.2)
Total Protein: 6.8 g/dL (ref 6.5–8.1)

## 2020-09-09 LAB — PROTIME-INR
INR: 1.1 (ref 0.8–1.2)
Prothrombin Time: 13.4 seconds (ref 11.4–15.2)

## 2020-09-09 LAB — CBC
HCT: 39.7 % (ref 39.0–52.0)
Hemoglobin: 13 g/dL (ref 13.0–17.0)
MCH: 30.6 pg (ref 26.0–34.0)
MCHC: 32.7 g/dL (ref 30.0–36.0)
MCV: 93.4 fL (ref 80.0–100.0)
Platelets: 82 10*3/uL — ABNORMAL LOW (ref 150–400)
RBC: 4.25 MIL/uL (ref 4.22–5.81)
RDW: 14.9 % (ref 11.5–15.5)
WBC: 3.7 10*3/uL — ABNORMAL LOW (ref 4.0–10.5)
nRBC: 0 % (ref 0.0–0.2)

## 2020-09-09 LAB — MAGNESIUM: Magnesium: 1.8 mg/dL (ref 1.7–2.4)

## 2020-09-09 LAB — CULTURE, BLOOD (ROUTINE X 2)
Culture: NO GROWTH
Special Requests: ADEQUATE

## 2020-09-09 LAB — GLUCOSE, CAPILLARY
Glucose-Capillary: 112 mg/dL — ABNORMAL HIGH (ref 70–99)
Glucose-Capillary: 111 mg/dL — ABNORMAL HIGH (ref 70–99)
Glucose-Capillary: 108 mg/dL — ABNORMAL HIGH (ref 70–99)
Glucose-Capillary: 97 mg/dL (ref 70–99)
Glucose-Capillary: 109 mg/dL — ABNORMAL HIGH (ref 70–99)

## 2020-09-09 MED ORDER — ACETAMINOPHEN 325 MG PO TABS
650.0000 mg | ORAL_TABLET | Freq: Three times a day (TID) | ORAL | Status: DC | PRN
  Administered 2020-09-09 – 2020-09-18 (×9): 650 mg via ORAL
  Filled 2020-09-09 (×9): qty 2

## 2020-09-09 MED ORDER — INSULIN ASPART 100 UNIT/ML ~~LOC~~ SOLN
0.0000 [IU] | Freq: Three times a day (TID) | SUBCUTANEOUS | Status: DC
  Administered 2020-09-11 – 2020-09-21 (×5): 1 [IU] via SUBCUTANEOUS
  Administered 2020-09-24: 2 [IU] via SUBCUTANEOUS
  Administered 2020-09-25 (×2): 3 [IU] via SUBCUTANEOUS
  Administered 2020-09-25: 1 [IU] via SUBCUTANEOUS
  Administered 2020-09-26: 5 [IU] via SUBCUTANEOUS
  Administered 2020-09-26: 1 [IU] via SUBCUTANEOUS
  Administered 2020-09-26: 3 [IU] via SUBCUTANEOUS
  Administered 2020-09-27 (×3): 2 [IU] via SUBCUTANEOUS
  Administered 2020-09-27: 1 [IU] via SUBCUTANEOUS
  Administered 2020-09-28 – 2020-09-29 (×5): 2 [IU] via SUBCUTANEOUS

## 2020-09-09 NOTE — Progress Notes (Signed)
-  Patient not seen.  Chart reviewed.  Looks like patient has refused blood draws for today. -GI will follow from distance.  Call us back if needed  Otis Brace MD, [place] 09/09/2020, 11:31 AM  Contact #  [phone]

## 2020-09-09 NOTE — Progress Notes (Signed)
PROGRESS NOTE    Todd [name].  OU:[id number] DOB: 02-19-1956 DOA: 09/03/2020 PCP: [name], MD   Brief Narrative:  65 year old with history of DM2, HTN, CKD, alcohol/tobacco and heroin abuse presented from homeless shelter when he was found down and intubated in the ER.  There was suspicion for seizure-like activity after intubation.  Also found to have COVID-19.  CT head showed bilateral chronic infarct, moderate cerebellar disease but nothing acute.  EEG showed diffuse encephalopathy.  LP was negative for meningitis.  Initially received vancomycin, Zosyn and acyclovir which was later transitioned to Unasyn for aspiration pneumonia.  Seen by cardiology due to elevated troponin.  Also seen by GI due to severe transaminitis, currently advising to monitor.  LFTs are improving.  PT consulted.   Assessment & Plan:   Active Problems:   Acute respiratory failure with hypoxia (HCC)  Acute hypoxic respiratory failure secondary to COVID-19 infection and aspiration pneumonia -Remains on room air.  Extubated 1/20.  Continue bronchodilators -Continue IV Unasyn for aspiration pneumonia.  Tentative last day 1/26, changed to Augmentin at discharge sooner - Procalcitonin 3.6 -Speech and swallow-dysphagia 3 diet - Incentive spirometer, flutter valve  Unresponsive concerns of acute meningitis/encephalitis - LP negative.  Vancomycin, Zosyn and acyclovir discontinued.  CSF cultures negative. - CT head- negative for acute changes but does have chronic disease - EEG-diffuse encephalopathy, no seizures - UDS-negative -Echocardiogram -echocardiogram EF 65%, grade 1 DD  Elevated Troponins -Chest pain-free.  EKG at baseline shows bundle branch block.  Echocardiogram EF 65%, grade 1 DD.  Troponins elevated but flat.  Seen by cardiology, outpatient ischemic eval  AKI on CKD stage IIIa - Admission creatinine 2.4.  Improving.  Creatinine 1.7 on 1/24.  Refusing lab work today.  Transaminitis,  alcohol pattern.  Improving History of hepatitis C - Hep C antibodies positive, hep C viral load- undetected - Shock liver versus ischemic hepatitis versus alcoholic hepatitis. - Seen by [facility], continue to monitor -HIV-negative - Abdominal ultrasound-unremarkable -Monitor for signs of withdrawal -Refused lab work this morning  Mild rhabdomyolysis -Resolved with IV fluids  Diarrhea, nonspecific - C. difficile-negative.  Could be related to COVID-19 infection  Macrocytic anemia - Suspect from underlying alcohol use - B12, TSH-normal.   - Iron studies shows low saturation, elevated ferritin.  History of esophagitis, ulcerative - Continue PPI  Polysubstance abuse - Will need counseling to quit using this  History of DVT PE LLE, Chronic - Used to be on anticoagulation?Marland Kitchen  Ultrasound Dopplers discussed that shows chronic DVT.  Previously is completed course for Coumadin - Has history of laser ablation of his DVT?Marland Kitchen  Followed [facility].  No longer on anticoagulation.  Hx of dilated aorta Seen by CT surgery October 2019.  Discussed blood pressure control and follow-up in 2 years with CTA chest.  This can be done outpatient upon discharge  PT/OT-skilled PT  Patient is refusing any nursing care her blood draw this morning.  He is also being difficult about working with physical therapy.  DVT prophylaxis: Subcu heparin Code Status: Full code Family Communication:    Status is: Inpatient  Remains inpatient appropriate because:IV treatments appropriate due to intensity of illness or inability to take PO   Dispo: The patient is from: Home              Anticipated d/c is to: Home              Anticipated d/c date is: 1-2 days  Patient currently is not medically stable to d/c.  I have advised patient to let us obtain his blood draws so we can monitor CMP.  Also needs to participate with PT to have a safe disposition planning.  Body mass index is 29.82 kg/m.      Subjective: Seen and examined at bedside, no complaints.  Refused blood draw and working with physical therapy.  I explained to him the importance of working with physical therapy and he understands.  He tells me he was sleeping therefore he did not work with him.  Review of Systems Otherwise negative except as per HPI, including:  General: Denies fever, chills, night sweats or unintended weight loss. Resp: Denies cough, wheezing, shortness of breath. Cardiac: Denies chest pain, palpitations, orthopnea, paroxysmal nocturnal dyspnea. GI: Denies abdominal pain, nausea, vomiting, diarrhea or constipation GU: Denies dysuria, frequency, hesitancy or incontinence MS: Denies muscle aches, joint pain or swelling Neuro: Denies headache, neurologic deficits (focal weakness, numbness, tingling), abnormal gait Psych: Denies anxiety, depression, SI/HI/AVH Skin: Denies new rashes or lesions ID: Denies sick contacts, exotic exposures, travel  Examination: Constitutional: Not in acute distress Respiratory: Clear to auscultation bilaterally Cardiovascular: Normal sinus rhythm, no rubs Abdomen: Nontender nondistended good bowel sounds Musculoskeletal: No edema noted Skin: No rashes seen Neurologic: CN 2-12 grossly intact.  And nonfocal Psychiatric: Normal judgment and insight. Alert and oriented x 3. Normal mood.  Objective: Vitals:   09/08/20 0500 09/08/20 1604 09/08/20 1936 09/09/20 0435  BP: 140/86 (!) 143/89 128/85 128/78  Pulse: 72 67 76 77  Resp: 16 16 16 14   Temp: 98 F (36.7 C) 99 F (37.2 C) 99.1 F (37.3 C) 98 F (36.7 C)  TempSrc: Oral   Oral  SpO2: 99% 95% 96% 96%  Weight:      Height:        Intake/Output Summary (Last 24 hours) at 09/09/2020 1038 Last data filed at 09/09/2020 0616 Gross per 24 hour  Intake 108.49 ml  Output 220 ml  Net -111.51 ml   Filed Weights   09/03/20 2147 09/04/20 0339 09/06/20 0500  Weight: 89 kg 89 kg 83.8 kg     Data Reviewed:    CBC: Recent Labs  Lab 09/03/20 1623 09/03/20 1803 09/03/20 2009 09/04/20 0348 09/05/20 0815 09/06/20 0500 09/07/20 0336 09/08/20 0452  WBC 8.2  --  6.7 7.4  --  5.0 3.3* 3.3*  NEUTROABS 6.4  --   --   --   --   --   --   --   HGB 12.7*   < > 11.5* 11.9*  --  12.2* 11.8* 12.5*  HCT 41.8   < > 38.5* 37.4* 34.0* 35.9* 34.8* 38.3*  MCV 105.3*  --  104.3* 100.8*  --  93.2 93.5 93.9  PLT 208  --  163 135*  --  100* 91* 79*   < > = values in this interval not displayed.   Basic Metabolic Panel: Recent Labs  Lab 09/03/20 1623 09/03/20 1803 09/03/20 2009 09/04/20 0348 09/06/20 0500 09/07/20 0336 09/08/20 0452  NA 139 139  --  140 140 138 136  K 5.7* 5.4*  --  4.4 3.0* 3.9 4.1  CL 108  --   --  112* 109 110 109  CO2 13*  --   --  17* 19* 18* 16*  GLUCOSE 73  --   --  117* 91 130* 101*  BUN 21  --   --  26* 16 15 13   CREATININE  2.45*  --   --  2.30* 2.02* 1.92* 1.70*  CALCIUM 8.6*  --   --  7.8* 8.0* 8.1* 8.1*  MG  --   --  1.6* 1.4* 1.7 2.2 1.8  PHOS  --   --  6.3* 3.7  --   --   --    GFR: Estimated Creatinine Clearance: 44.6 mL/min (A) (by C-G formula based on SCr of 1.7 mg/dL (H)). Liver Function Tests: Recent Labs  Lab 09/03/20 1623 09/04/20 0348 09/06/20 0500 09/07/20 0336 09/08/20 0452  AST 502* 974* 8,396* 1,670* 511*  ALT 147* 203* 2,465* 1,584* 1,025*  ALKPHOS 97 66 69 61 65  BILITOT 0.8 0.7 1.2 0.7 1.2  PROT 7.0 5.6* 6.2* 6.2* 6.6  ALBUMIN 3.6 2.9* 2.9* 2.7* 2.9*   No results for input(s): LIPASE, AMYLASE in the last 168 hours. No results for input(s): AMMONIA in the last 168 hours. Coagulation Profile: Recent Labs  Lab 09/03/20 2009 09/06/20 1158  INR 1.4* 1.2   Cardiac Enzymes: Recent Labs  Lab 09/04/20 0348 09/05/20 0815 09/06/20 0500 09/07/20 0336 09/08/20 0452  CKTOTAL 674* 922* 725* 347 181   BNP (last 3 results) No results for input(s): PROBNP in the last 8760 hours. HbA1C: No results for input(s): HGBA1C in the last 72  hours. CBG: Recent Labs  Lab 09/08/20 1604 09/08/20 1935 09/08/20 2307 09/09/20 0259 09/09/20 0902  GLUCAP 103* 112* 125* 112* 111*   Lipid Profile: No results for input(s): CHOL, HDL, LDLCALC, TRIG, CHOLHDL, LDLDIRECT in the last 72 hours. Thyroid Function Tests: No results for input(s): TSH, T4TOTAL, FREET4, T3FREE, THYROIDAB in the last 72 hours. Anemia Panel: No results for input(s): VITAMINB12, FOLATE, FERRITIN, TIBC, IRON, RETICCTPCT in the last 72 hours. Sepsis Labs: Recent Labs  Lab 09/03/20 1705 09/03/20 2009 09/04/20 0348  PROCALCITON  --  3.60  --   LATICACIDVEN 3.0*  --  2.6*    Recent Results (from the past 240 hour(s))  Urine culture     Status: Abnormal   Collection Time: 09/03/20  5:05 PM   Specimen: Urine, Random  Result Value Ref Range Status   Specimen Description URINE, RANDOM  Final   Special Requests NONE  Final   Culture (A)  Final    <10,000 COLONIES/mL INSIGNIFICANT GROWTH Performed at [facility] [address], [place], [place] [address]    Report Status 09/05/2020 FINAL  Final  Resp Panel by RT-PCR (Flu A&B, Covid) Nasopharyngeal Swab     Status: Abnormal   Collection Time: 09/03/20  5:05 PM   Specimen: Nasopharyngeal Swab; Nasopharyngeal(NP) swabs in vial transport medium  Result Value Ref Range Status   SARS Coronavirus 2 by RT PCR POSITIVE (A) NEGATIVE Final    Comment: RESULT CALLED TO, READ BACK BY AND VERIFIED WITH: A BANKS RN 1841 09/03/20 A BROWNING (NOTE) SARS-CoV-2 target nucleic acids are DETECTED.  The SARS-CoV-2 RNA is generally detectable in upper respiratory specimens during the acute phase of infection. Positive results are indicative of the presence of the identified virus, but do not rule out bacterial infection or co-infection with other pathogens not detected by the test. Clinical correlation with patient history and other diagnostic information is necessary to determine patient infection status. The  expected result is Negative.  Fact Sheet for Patients: EntrepreneurPulse.com.au  Fact Sheet for Healthcare Providers: IncredibleEmployment.be  This test is not yet approved or cleared by the [place] FDA and  has been authorized for detection and/or diagnosis of SARS-CoV-2  by FDA under an Emergency Use Authorization (EUA).  This EUA will remain in effect (meaning this test can b e used) for the duration of  the COVID-19 declaration under Section 564(b)(1) of the Act, 21 U.S.C. section 360bbb-3(b)(1), unless the authorization is terminated or revoked sooner.     Influenza A by PCR NEGATIVE NEGATIVE Final   Influenza B by PCR NEGATIVE NEGATIVE Final    Comment: (NOTE) The Xpert Xpress SARS-CoV-2/FLU/RSV plus assay is intended as an aid in the diagnosis of influenza from Nasopharyngeal swab specimens and should not be used as a sole basis for treatment. Nasal washings and aspirates are unacceptable for Xpert Xpress SARS-CoV-2/FLU/RSV testing.  Fact Sheet for Patients: EntrepreneurPulse.com.au  Fact Sheet for Healthcare Providers: IncredibleEmployment.be  This test is not yet approved or cleared by the [place] FDA and has been authorized for detection and/or diagnosis of SARS-CoV-2 by FDA under an Emergency Use Authorization (EUA). This EUA will remain in effect (meaning this test can be used) for the duration of the COVID-19 declaration under Section 564(b)(1) of the Act, 21 U.S.C. section 360bbb-3(b)(1), unless the authorization is terminated or revoked.  Performed at [facility], [place] [address] [place], [place] [address]   CSF culture     Status: None   Collection Time: 09/03/20  6:11 PM   Specimen: CSF; Cerebrospinal Fluid  Result Value Ref Range Status   Specimen Description CSF  Final   Special Requests NONE  Final   Gram Stain   Final    WBC PRESENT, PREDOMINANTLY  MONONUCLEAR NO ORGANISMS SEEN CYTOSPIN SMEAR    Culture   Final    NO GROWTH Performed at [facility], [place] [address] [place], [place] [address]    Report Status 09/07/2020 FINAL  Final  Culture, blood (routine x 2)     Status: None   Collection Time: 09/03/20  8:12 PM   Specimen: BLOOD RIGHT HAND  Result Value Ref Range Status   Specimen Description BLOOD RIGHT HAND  Final   Special Requests   Final    BOTTLES DRAWN AEROBIC AND ANAEROBIC Blood Culture results may not be optimal due to an inadequate volume of blood received in culture bottles   Culture   Final    NO GROWTH 5 DAYS Performed at [facility], [place] [address], [place], [place] [address]    Report Status 09/08/2020 FINAL  Final  Culture, blood (routine x 2)     Status: None (Preliminary result)   Collection Time: 09/04/20  3:48 AM   Specimen: BLOOD RIGHT HAND  Result Value Ref Range Status   Specimen Description BLOOD RIGHT HAND  Final   Special Requests   Final    BOTTLES DRAWN AEROBIC ONLY Blood Culture adequate volume   Culture   Final    NO GROWTH 4 DAYS Performed at [facility], [place] [address] [place], [place] [address]    Report Status PENDING  Incomplete  C Difficile Quick Screen w PCR reflex     Status: None   Collection Time: 09/05/20  2:11 PM   Specimen: STOOL  Result Value Ref Range Status   C Diff antigen NEGATIVE NEGATIVE Final   C Diff toxin NEGATIVE NEGATIVE Final   C Diff interpretation No C. difficile detected.  Final    Comment: Performed at [facility], [place] [address] [place], [place] [address]         Radiology Studies: No results  found.      Scheduled Meds: . amLODipine  5 mg Oral Daily  . aspirin EC  81 mg Oral Daily  . Chlorhexidine Gluconate Cloth  6 each Topical Daily  . docusate  100 mg Per Tube BID  . folic acid  1 mg Per Tube Daily  . heparin  5,000 Units Subcutaneous Q8H  . insulin aspart  0-9 Units Subcutaneous Q4H  . metoprolol  tartrate  25 mg Oral BID  . multivitamin with minerals  1 tablet Per Tube Daily  . pantoprazole sodium  40 mg Per Tube QHS  . polyethylene glycol  17 g Per Tube Daily  . thiamine  100 mg Per Tube Daily   Continuous Infusions: . ampicillin-sulbactam (UNASYN) IV 3 g (09/09/20 0914)     LOS: 6 days   Time spent= 35 mins    Ankit Arsenio Loader, MD [facility]  If 7PM-7AM, please contact night-coverage  09/09/2020, 10:38 AM

## 2020-09-09 NOTE — Progress Notes (Signed)
Pt refused am lab work and heparin injection. Pt very ornery throughout the night and doesn't like to be bothered. Might be beneficial to change blood sugar checks from every 4 hours to ac/hs. Pt hasn't been requiring any coverage.

## 2020-09-09 NOTE — Progress Notes (Addendum)
Physical Therapy Treatment Patient Details Name: Todd [name]. MRN: [id number] DOB: 1955-09-23 Today's Date: 09/09/2020    History of Present Illness 65 year old with history of DM2, HTN, CKD, alcohol/tobacco and heroin abuse, Hepatitis C presented 09/03/20 from homeless shelter when he was found down. Intubated 1/19-1/20.  There was suspicion for seizure-like activity after intubation. UDS negative.   CT head showed bilateral chronic infarct, moderate cerebellar disease but nothing acute.  EEG showed diffuse encephalopathy.  LP was negative for meningitis; Also found to have COVID-19.+aspiration pneumonia;    PT Comments    Pt in chair on arrival with nurse present. Pt agreeable to in room gait with RW although not pleased that rollator not present. Pt educated for rollator not able to be present at all times on current unit and able to use RW to continue to progress mobility. Pt able to walk to door and back but denied all further mobility or HEP. Pt very self limiting stating he cannot don sock without attempting then able to complete task but stopping midway. Pt with highly fluctuant attitude between pleasant and agitated limiting ability to participate and progress function. Pt does state he would like to go to SNF and understanding of need to work with therapy.    Follow Up Recommendations  SNF     Equipment Recommendations  Other (comment) (rollator)    Recommendations for Other Services       Precautions / Restrictions Precautions Precautions: Fall Restrictions Weight Bearing Restrictions: No    Mobility  Bed Mobility   Bed Mobility: Sit to Supine       Sit to supine: Min guard   General bed mobility comments: pt sat on bed despite cues to return to chair. Sit to supine without physical assist despite cues to remove gait belt. pt able to unhook belt in supine. Pt slid to [facility] with cues and increased time to initiate  Transfers Overall transfer level: Needs  assistance   Transfers: Sit to/from Stand Sit to Stand: Min guard         General transfer comment: guarding to rise from recliner with cues for hand placement, abrupt sitting at bed end of gait  Ambulation/Gait Ambulation/Gait assistance: Min assist Gait Distance (Feet): 15 Feet Assistive device: Rolling walker (2 wheeled) Gait Pattern/deviations: Step-through pattern;Decreased stride length;Trunk flexed   Gait velocity interpretation: 1.31 - 2.62 ft/sec, indicative of limited community ambulator General Gait Details: pt with slow gait with reliance on RW for gait. Pt not pleased to use RW rather than rollator but agreeable for in room gait only. Pt with one LOB with tactile cues to recover, cues to step into RW. Pt declined further gait stating knee pain   Stairs             Wheelchair Mobility    Modified Rankin (Stroke Patients Only)       Balance Overall balance assessment: Needs assistance Sitting-balance support: No upper extremity supported;Feet supported Sitting balance-Leahy Scale: Fair     Standing balance support: Single extremity supported;Bilateral upper extremity supported Standing balance-Leahy Scale: Poor Standing balance comment: reliant on at least one UE support                            Cognition Arousal/Alertness: Awake/alert Behavior During Therapy: Flat affect;Agitated Overall Cognitive Status: No family/caregiver present to determine baseline cognitive functioning Area of Impairment: Safety/judgement;Following commands;Problem solving  Current Attention Level: Sustained Memory: Decreased short-term memory Following Commands: Follows one step commands with increased time;Follows one step commands inconsistently Safety/Judgement: Decreased awareness of deficits;Decreased awareness of safety   Problem Solving: Slow processing General Comments: pt able to follow commands but frequently states "I can't"  prior to attempting. When cued for way to don sock for example he gets agitated will try partially when fully capable of completing task then stop. Pt with quick and frequent demeanor changes between agitation and pleasant. Will complete tasks with increased time and prompting. Agitated with questions      Exercises      General Comments        Pertinent Vitals/Pain Faces Pain Scale: Hurts little more Pain Location: left knee and HA Pain Descriptors / Indicators: Aching Pain Intervention(s): Limited activity within patient's tolerance;Monitored during session;Repositioned;Patient requesting pain meds-RN notified    Home Living                      Prior Function            PT Goals (current goals can now be found in the care plan section) Progress towards PT goals: Progressing toward goals    Frequency    Min 2X/week      PT Plan Current plan remains appropriate;Frequency needs to be updated    Co-evaluation              AM-PAC PT "6 Clicks" Mobility   Outcome Measure  Help needed turning from your back to your side while in a flat bed without using bedrails?: None Help needed moving from lying on your back to sitting on the side of a flat bed without using bedrails?: A Little Help needed moving to and from a bed to a chair (including a wheelchair)?: A Little Help needed standing up from a chair using your arms (e.g., wheelchair or bedside chair)?: A Little Help needed to walk in hospital room?: A Little Help needed climbing 3-5 steps with a railing? : Total 6 Click Score: 17    End of Session Equipment Utilized During Treatment: Gait belt Activity Tolerance: Patient limited by pain Patient left: in bed;with call bell/phone within reach;with bed alarm set Nurse Communication: Mobility status PT Visit Diagnosis: Muscle weakness (generalized) (M62.81);Other abnormalities of gait and mobility (R26.89);Pain     Time: 1208-1227 PT Time Calculation  (min) (ACUTE ONLY): 19 min  Charges:  $Gait Training: 8-22 mins                     Bayard Males, PT Acute Rehabilitation Services Pager: [phone] Office: [place] 09/09/2020, 12:50 PM

## 2020-09-10 DIAGNOSIS — J9602 Acute respiratory failure with hypercapnia: Secondary | ICD-10-CM | POA: Diagnosis not present

## 2020-09-10 DIAGNOSIS — J9601 Acute respiratory failure with hypoxia: Secondary | ICD-10-CM | POA: Diagnosis not present

## 2020-09-10 LAB — GLUCOSE, CAPILLARY
Glucose-Capillary: 94 mg/dL (ref 70–99)
Glucose-Capillary: 95 mg/dL (ref 70–99)
Glucose-Capillary: 94 mg/dL (ref 70–99)
Glucose-Capillary: 97 mg/dL (ref 70–99)

## 2020-09-10 LAB — CBC
HCT: 39.6 % (ref 39.0–52.0)
Hemoglobin: 12.8 g/dL — ABNORMAL LOW (ref 13.0–17.0)
MCH: 30.3 pg (ref 26.0–34.0)
MCHC: 32.3 g/dL (ref 30.0–36.0)
MCV: 93.6 fL (ref 80.0–100.0)
Platelets: 95 10*3/uL — ABNORMAL LOW (ref 150–400)
RBC: 4.23 MIL/uL (ref 4.22–5.81)
RDW: 15.1 % (ref 11.5–15.5)
WBC: 4.4 10*3/uL (ref 4.0–10.5)
nRBC: 0 % (ref 0.0–0.2)

## 2020-09-10 LAB — MAGNESIUM: Magnesium: 1.8 mg/dL (ref 1.7–2.4)

## 2020-09-10 NOTE — Progress Notes (Signed)
PROGRESS NOTE    Todd [name].  SS:[id number] DOB: 1956/02/10 DOA: 09/03/2020 PCP: [name], MD    Brief Narrative:  65 year old with history of DM2, HTN, CKD, alcohol/tobacco and heroin abuse presented from homeless shelter when he was found down and intubated in the ER.  There was suspicion for seizure-like activity after intubation.  Also found to have COVID-19.  CT head showed bilateral chronic infarct, moderate cerebellar disease but nothing acute.  EEG showed diffuse encephalopathy.  LP was negative for meningitis.  Initially received vancomycin, Zosyn and acyclovir which was later transitioned to Unasyn for aspiration pneumonia.  Seen by cardiology due to elevated troponin.  Also seen by GI due to severe transaminitis, currently advising to monitor.  LFTs are improving.  PT consulted.  Assessment & Plan:   Active Problems:   Acute respiratory failure with hypoxia (HCC)   Acute hypoxic respiratory failure secondary to COVID-19 infection and aspiration pneumonia -Remains on room air.  Extubated 1/20.  Continue bronchodilators -Continue IV Unasyn for aspiration pneumonia.  Tentative last day 1/26, changed to Augmentin at discharge sooner - Procalcitonin 3.6 -Speech and swallow-dysphagia 3 diet - Incentive spirometer, flutter valve -Currently on minimal O2 support  Unresponsive concerns of acute meningitis/encephalitis - LP negative.  Vancomycin, Zosyn and acyclovir discontinued.  CSF cultures negative. - CT head- negative for acute changes but does have chronic disease - EEG-diffuse encephalopathy, no seizures - UDS-negative -Echocardiogram -echocardiogram EF 65%, grade 1 DD  Elevated Troponins -Chest pain-free.  EKG at baseline shows bundle branch block.  Echocardiogram EF 65%, grade 1 DD.  Troponins elevated but flat.  Seen by cardiology, outpatient ischemic eval  AKI on CKD stage IIIa - Admission creatinine 2.4.  Improving.  Creatinine 1.7 on 1/24.  Pt had  been refusing lab work  Transaminitis, alcohol pattern.  Improving History of hepatitis C - Hep C antibodies positive, hep C viral load- undetected - Shock liver versus ischemic hepatitis versus alcoholic hepatitis. - Seen by [facility],  -HIV-negative - Abdominal ultrasound-unremarkable -LFT's now trending down. GI has since signed off  Mild rhabdomyolysis -Resolved with IV fluids  Diarrhea, nonspecific - C. difficile-negative.  Could be related to COVID-19 infection  Macrocytic anemia - Suspect from underlying alcohol use - B12, TSH-normal.   - Iron studies shows low saturation, elevated ferritin.  History of esophagitis, ulcerative - Continue PPI as toelrated  Polysubstance abuse - Will need counseling to quit using this  History of DVT PE LLE, Chronic - Used to be on anticoagulation?Marland Kitchen  Ultrasound Dopplers discussed that shows chronic DVT.  Previously is completed course for Coumadin - Has history of laser ablation of his DVT  Hx of dilated aorta Seen by CT surgery October 2019.  Discussed blood pressure control and follow-up in 2 years with CTA chest.  Recommend f/u as outpatient  DVT prophylaxis: Heparin subq Code Status: Full Family Communication: Pt in room, family not at bedside  Status is: Inpatient  Remains inpatient appropriate because:Unsafe d/c plan and Inpatient level of care appropriate due to severity of illness   Dispo: The patient is from: Homeless              Anticipated d/c is to: Pending TOC work up              Anticipated d/c date is: 2 days              Patient currently is medically stable to d/c. Pending safe dispo   Difficult to place  patient Yes   Consultants:   GI  Procedures:     Antimicrobials: Anti-infectives (From admission, onward)   Start     Dose/Rate Route Frequency Ordered Stop   09/08/20 1800  Ampicillin-Sulbactam (UNASYN) 3 g in sodium chloride 0.9 % 100 mL IVPB        3 g 200 mL/hr over 30 Minutes  Intravenous Every 8 hours 09/08/20 1314 09/09/20 1748   09/04/20 2000  acyclovir (ZOVIRAX) 640 mg in dextrose 5 % 100 mL IVPB  Status:  Discontinued        10 mg/kg  63.8 kg (Ideal) 112.8 mL/hr over 60 Minutes Intravenous Every 12 hours 09/04/20 0826 09/04/20 0907   09/04/20 1000  Ampicillin-Sulbactam (UNASYN) 3 g in sodium chloride 0.9 % 100 mL IVPB  Status:  Discontinued        3 g 200 mL/hr over 30 Minutes Intravenous Every 8 hours 09/04/20 0907 09/08/20 1315   09/03/20 2200  piperacillin-tazobactam (ZOSYN) IVPB 3.375 g  Status:  Discontinued        3.375 g 12.5 mL/hr over 240 Minutes Intravenous Every 8 hours 09/03/20 1858 09/03/20 1928   09/03/20 2002  vancomycin variable dose per unstable renal function (pharmacist dosing)  Status:  Discontinued         Does not apply See admin instructions 09/03/20 2002 09/04/20 0907   09/03/20 2000  acyclovir (ZOVIRAX) 1,000 mg in dextrose 5 % 150 mL IVPB  Status:  Discontinued        10 mg/kg  100 kg 170 mL/hr over 60 Minutes Intravenous Every 12 hours 09/03/20 1937 09/04/20 0826   09/03/20 1930  piperacillin-tazobactam (ZOSYN) IVPB 3.375 g  Status:  Discontinued        3.375 g 12.5 mL/hr over 240 Minutes Intravenous Every 8 hours 09/03/20 1928 09/04/20 0907   09/03/20 1815  ceFEPIme (MAXIPIME) 2 g in sodium chloride 0.9 % 100 mL IVPB  Status:  Discontinued        2 g 200 mL/hr over 30 Minutes Intravenous  Once 09/03/20 1801 09/03/20 1828   09/03/20 1815  metroNIDAZOLE (FLAGYL) IVPB 500 mg  Status:  Discontinued        500 mg 100 mL/hr over 60 Minutes Intravenous  Once 09/03/20 1801 09/03/20 1828   09/03/20 1815  vancomycin (VANCOCIN) IVPB 1000 mg/200 mL premix  Status:  Discontinued        1,000 mg 200 mL/hr over 60 Minutes Intravenous  Once 09/03/20 1801 09/03/20 1812   09/03/20 1815  vancomycin (VANCOREADY) IVPB 2000 mg/400 mL        2,000 mg 200 mL/hr over 120 Minutes Intravenous  Once 09/03/20 1812 09/03/20 2326        Subjective: Without compaints  Objective: Vitals:   09/09/20 2115 09/10/20 0617 09/10/20 0855 09/10/20 1539  BP: 120/87 129/85 120/85 122/87  Pulse: 75 83 95 88  Resp: 18   20  Temp: 98.4 F (36.9 C) 98.2 F (36.8 C) 99.5 F (37.5 C) 97.9 F (36.6 C)  TempSrc: Oral Oral Oral   SpO2: 95% 98% 98% 95%  Weight:      Height:        Intake/Output Summary (Last 24 hours) at 09/10/2020 1913 Last data filed at 09/10/2020 1815 Gross per 24 hour  Intake -  Output 500 ml  Net -500 ml   Filed Weights   09/03/20 2147 09/04/20 0339 09/06/20 0500  Weight: 89 kg 89 kg 83.8 kg    Examination:  General  exam: Appears calm and comfortable  Respiratory system: Normal resp effort. Respiratory effort normal. Cardiovascular system: perfused, no notable JVD Gastrointestinal system: Abdomen is nondistended, soft and nontender. No organomegaly or masses felt. Normal bowel sounds heard. Central nervous system: Alert and oriented. No focal neurological deficits. Extremities: Symmetric 5 x 5 power. Skin: No rashes, lesions Psychiatry: Judgement and insight appear normal. Mood & affect appropriate.   Data Reviewed: I have personally reviewed following labs and imaging studies  CBC: Recent Labs  Lab 09/06/20 0500 09/07/20 0336 09/08/20 0452 09/09/20 1021 09/10/20 0411  WBC 5.0 3.3* 3.3* 3.7* 4.4  HGB 12.2* 11.8* 12.5* 13.0 12.8*  HCT 35.9* 34.8* 38.3* 39.7 39.6  MCV 93.2 93.5 93.9 93.4 93.6  PLT 100* 91* 79* 82* 95*   Basic Metabolic Panel: Recent Labs  Lab 09/03/20 2009 09/04/20 0348 09/06/20 0500 09/07/20 0336 09/08/20 0452 09/09/20 1021 09/10/20 0411  NA  --  140 140 138 136  --   --   K  --  4.4 3.0* 3.9 4.1  --   --   CL  --  112* 109 110 109  --   --   CO2  --  17* 19* 18* 16*  --   --   GLUCOSE  --  117* 91 130* 101*  --   --   BUN  --  26* 16 15 13   --   --   CREATININE  --  2.30* 2.02* 1.92* 1.70*  --   --   CALCIUM  --  7.8* 8.0* 8.1* 8.1*  --   --   MG  1.6* 1.4* 1.7 2.2 1.8 1.8 1.8  PHOS 6.3* 3.7  --   --   --   --   --    GFR: Estimated Creatinine Clearance: 44.6 mL/min (A) (by C-G formula based on SCr of 1.7 mg/dL (H)). Liver Function Tests: Recent Labs  Lab 09/04/20 0348 09/06/20 0500 09/07/20 0336 09/08/20 0452 09/09/20 1021  AST 974* 8,396* 1,670* 511* 195*  ALT 203* 2,465* 1,584* 1,025* 655*  ALKPHOS 66 69 61 65 56  BILITOT 0.7 1.2 0.7 1.2 0.6  PROT 5.6* 6.2* 6.2* 6.6 6.8  ALBUMIN 2.9* 2.9* 2.7* 2.9* 2.7*   No results for input(s): LIPASE, AMYLASE in the last 168 hours. No results for input(s): AMMONIA in the last 168 hours. Coagulation Profile: Recent Labs  Lab 09/03/20 2009 09/06/20 1158 09/09/20 1021  INR 1.4* 1.2 1.1   Cardiac Enzymes: Recent Labs  Lab 09/04/20 0348 09/05/20 0815 09/06/20 0500 09/07/20 0336 09/08/20 0452  CKTOTAL 674* 922* 725* 347 181   BNP (last 3 results) No results for input(s): PROBNP in the last 8760 hours. HbA1C: No results for input(s): HGBA1C in the last 72 hours. CBG: Recent Labs  Lab 09/09/20 1604 09/09/20 2048 09/10/20 0622 09/10/20 1151 09/10/20 1539  GLUCAP 97 109* 94 95 94   Lipid Profile: No results for input(s): CHOL, HDL, LDLCALC, TRIG, CHOLHDL, LDLDIRECT in the last 72 hours. Thyroid Function Tests: No results for input(s): TSH, T4TOTAL, FREET4, T3FREE, THYROIDAB in the last 72 hours. Anemia Panel: No results for input(s): VITAMINB12, FOLATE, FERRITIN, TIBC, IRON, RETICCTPCT in the last 72 hours. Sepsis Labs: Recent Labs  Lab 09/03/20 2009 09/04/20 0348  PROCALCITON 3.60  --   LATICACIDVEN  --  2.6*    Recent Results (from the past 240 hour(s))  Urine culture     Status: Abnormal   Collection Time: 09/03/20  5:05 PM   Specimen:  Urine, Random  Result Value Ref Range Status   Specimen Description URINE, RANDOM  Final   Special Requests NONE  Final   Culture (A)  Final    <10,000 COLONIES/mL INSIGNIFICANT GROWTH Performed at [facility], [address], [place], [place] [address]    Report Status 09/05/2020 FINAL  Final  Resp Panel by RT-PCR (Flu A&B, Covid) Nasopharyngeal Swab     Status: Abnormal   Collection Time: 09/03/20  5:05 PM   Specimen: Nasopharyngeal Swab; Nasopharyngeal(NP) swabs in vial transport medium  Result Value Ref Range Status   SARS Coronavirus 2 by RT PCR POSITIVE (A) NEGATIVE Final    Comment: RESULT CALLED TO, READ BACK BY AND VERIFIED WITH: A BANKS RN 1841 09/03/20 A BROWNING (NOTE) SARS-CoV-2 target nucleic acids are DETECTED.  The SARS-CoV-2 RNA is generally detectable in upper respiratory specimens during the acute phase of infection. Positive results are indicative of the presence of the identified virus, but do not rule out bacterial infection or co-infection with other pathogens not detected by the test. Clinical correlation with patient history and other diagnostic information is necessary to determine patient infection status. The expected result is Negative.  Fact Sheet for Patients: EntrepreneurPulse.com.au  Fact Sheet for Healthcare Providers: IncredibleEmployment.be  This test is not yet approved or cleared by the [place] FDA and  has been authorized for detection and/or diagnosis of SARS-CoV-2 by FDA under an Emergency Use Authorization (EUA).  This EUA will remain in effect (meaning this test can b e used) for the duration of  the COVID-19 declaration under Section 564(b)(1) of the Act, 21 U.S.C. section 360bbb-3(b)(1), unless the authorization is terminated or revoked sooner.     Influenza A by PCR NEGATIVE NEGATIVE Final   Influenza B by PCR NEGATIVE NEGATIVE Final    Comment: (NOTE) The Xpert Xpress SARS-CoV-2/FLU/RSV plus assay is intended as an aid in the diagnosis of influenza from Nasopharyngeal swab specimens and should not be used as a sole basis for treatment. Nasal washings and aspirates are unacceptable for Xpert  Xpress SARS-CoV-2/FLU/RSV testing.  Fact Sheet for Patients: EntrepreneurPulse.com.au  Fact Sheet for Healthcare Providers: IncredibleEmployment.be  This test is not yet approved or cleared by the [place] FDA and has been authorized for detection and/or diagnosis of SARS-CoV-2 by FDA under an Emergency Use Authorization (EUA). This EUA will remain in effect (meaning this test can be used) for the duration of the COVID-19 declaration under Section 564(b)(1) of the Act, 21 U.S.C. section 360bbb-3(b)(1), unless the authorization is terminated or revoked.  Performed at [facility], [place] [address], [place], [place] [address]   CSF culture     Status: None   Collection Time: 09/03/20  6:11 PM   Specimen: CSF; Cerebrospinal Fluid  Result Value Ref Range Status   Specimen Description CSF  Final   Special Requests NONE  Final   Gram Stain   Final    WBC PRESENT, PREDOMINANTLY MONONUCLEAR NO ORGANISMS SEEN CYTOSPIN SMEAR    Culture   Final    NO GROWTH Performed at [facility], [place] [address] [place], [place] [address]    Report Status 09/07/2020 FINAL  Final  Culture, blood (routine x 2)     Status: None   Collection Time: 09/03/20  8:12 PM   Specimen: BLOOD RIGHT HAND  Result Value Ref Range Status   Specimen Description BLOOD RIGHT HAND  Final   Special Requests   Final  BOTTLES DRAWN AEROBIC AND ANAEROBIC Blood Culture results may not be optimal due to an inadequate volume of blood received in culture bottles   Culture   Final    NO GROWTH 5 DAYS Performed at [facility], [place] [address] [place], [place] [address]    Report Status 09/08/2020 FINAL  Final  Culture, blood (routine x 2)     Status: None   Collection Time: 09/04/20  3:48 AM   Specimen: BLOOD RIGHT HAND  Result Value Ref Range Status   Specimen Description BLOOD RIGHT HAND  Final   Special Requests   Final    BOTTLES DRAWN AEROBIC ONLY Blood  Culture adequate volume   Culture   Final    NO GROWTH 5 DAYS Performed at [facility], [place] [address] [place], [place] [address]    Report Status 09/09/2020 FINAL  Final  C Difficile Quick Screen w PCR reflex     Status: None   Collection Time: 09/05/20  2:11 PM   Specimen: STOOL  Result Value Ref Range Status   C Diff antigen NEGATIVE NEGATIVE Final   C Diff toxin NEGATIVE NEGATIVE Final   C Diff interpretation No C. difficile detected.  Final    Comment: Performed at [facility], [place] [address], [place], [place] [address]     Radiology Studies: No results found.  Scheduled Meds: . amLODipine  5 mg Oral Daily  . aspirin EC  81 mg Oral Daily  . Chlorhexidine Gluconate Cloth  6 each Topical Daily  . docusate  100 mg Per Tube BID  . folic acid  1 mg Per Tube Daily  . heparin  5,000 Units Subcutaneous Q8H  . insulin aspart  0-9 Units Subcutaneous TID WC & HS  . metoprolol tartrate  25 mg Oral BID  . multivitamin with minerals  1 tablet Per Tube Daily  . pantoprazole sodium  40 mg Per Tube QHS  . polyethylene glycol  17 g Per Tube Daily  . thiamine  100 mg Per Tube Daily   Continuous Infusions:   LOS: 7 days   Marylu Lund, MD [facility] Pager On Amion  If 7PM-7AM, please contact night-coverage 09/10/2020, 7:13 PM

## 2020-09-10 NOTE — Progress Notes (Signed)
Pt very agitated, demanding and rude to staff. Pt cussing and refusing to be cleaned up. Pt's bed is soaked with urine and has been since I arrived for my shift. I brought clean linens into the rm and asked the pt if I could help him get cleaned up. He told me to "get the hell out."

## 2020-09-10 NOTE — Progress Notes (Signed)
Patient very agitated upon assessment, even after giving him a couple of hours to calm down, his agitation continued. Patient would not allow me to administer PM/nightly medications tonight. Patient is stable

## 2020-09-10 NOTE — Progress Notes (Signed)
-  Chart/Labs  reviewed.  Patient not seen.    -65 year old patient with multiple medical issues including COVID-19 and aspiration pneumonia, history of substance abuse, history of hepatitis C was seen by GI for evaluation of abnormal LFTs.  -HCV PCR negative 09/05/2020.  Hepatitis B surface antigen negative and hepatitis A IgM negative.   LFTs trending down.  Platelets stable. Normal INR.   Assessment ------------------ -Abnormal LFTs.  Most likely ischemic.  Improving. -COVID-19 and aspiration pneumonia -Altered mental status.  Improving -History of substance abuse -Acute kidney injury.  Improving -Diarrhea.  C. difficile negative.  Recommendations -------------------------- -Continue supportive care -Repeat labs periodically  -GI will follow from distance.  Call us if needed.   Otis Brace MD, [place] 09/10/2020, 8:25 AM  Contact #  [phone]

## 2020-09-11 DIAGNOSIS — U071 COVID-19: Secondary | ICD-10-CM | POA: Diagnosis not present

## 2020-09-11 DIAGNOSIS — J9602 Acute respiratory failure with hypercapnia: Secondary | ICD-10-CM | POA: Diagnosis not present

## 2020-09-11 LAB — CBC
HCT: 43.3 % (ref 39.0–52.0)
Hemoglobin: 14.5 g/dL (ref 13.0–17.0)
MCH: 31.5 pg (ref 26.0–34.0)
MCHC: 33.5 g/dL (ref 30.0–36.0)
MCV: 93.9 fL (ref 80.0–100.0)
Platelets: 137 10*3/uL — ABNORMAL LOW (ref 150–400)
RBC: 4.61 MIL/uL (ref 4.22–5.81)
RDW: 15.3 % (ref 11.5–15.5)
WBC: 4.2 10*3/uL (ref 4.0–10.5)
nRBC: 0 % (ref 0.0–0.2)

## 2020-09-11 LAB — COMPREHENSIVE METABOLIC PANEL
ALT: 358 U/L — ABNORMAL HIGH (ref 0–44)
AST: 106 U/L — ABNORMAL HIGH (ref 15–41)
Albumin: 2.9 g/dL — ABNORMAL LOW (ref 3.5–5.0)
Alkaline Phosphatase: 58 U/L (ref 38–126)
Anion gap: 12 (ref 5–15)
BUN: 19 mg/dL (ref 8–23)
CO2: 18 mmol/L — ABNORMAL LOW (ref 22–32)
Calcium: 9.1 mg/dL (ref 8.9–10.3)
Chloride: 113 mmol/L — ABNORMAL HIGH (ref 98–111)
Creatinine, Ser: 1.67 mg/dL — ABNORMAL HIGH (ref 0.61–1.24)
GFR, Estimated: 45 mL/min — ABNORMAL LOW (ref >60)
Glucose, Bld: 92 mg/dL (ref 70–99)
Potassium: 4.3 mmol/L (ref 3.5–5.1)
Sodium: 143 mmol/L (ref 135–145)
Total Bilirubin: 0.7 mg/dL (ref 0.3–1.2)
Total Protein: 7.8 g/dL (ref 6.5–8.1)

## 2020-09-11 LAB — BRAIN NATRIURETIC PEPTIDE: B Natriuretic Peptide: 67 pg/mL (ref 0.0–100.0)

## 2020-09-11 LAB — GLUCOSE, CAPILLARY
Glucose-Capillary: 74 mg/dL (ref 70–99)
Glucose-Capillary: 127 mg/dL — ABNORMAL HIGH (ref 70–99)
Glucose-Capillary: 91 mg/dL (ref 70–99)
Glucose-Capillary: 221 mg/dL — ABNORMAL HIGH (ref 70–99)

## 2020-09-11 NOTE — Progress Notes (Signed)
PROGRESS NOTE    Todd [name].  MWN:[id number] DOB: 1955-11-16 DOA: 09/03/2020 PCP: [name], MD    Brief Narrative:  65 year old with history of DM2, HTN, CKD, alcohol/tobacco and heroin abuse presented from homeless shelter when he was found down and intubated in the ER.  There was suspicion for seizure-like activity after intubation.  Also found to have COVID-19.  CT head showed bilateral chronic infarct, moderate cerebellar disease but nothing acute.  EEG showed diffuse encephalopathy.  LP was negative for meningitis.  Initially received vancomycin, Zosyn and acyclovir which was later transitioned to Unasyn for aspiration pneumonia.  Seen by cardiology due to elevated troponin.  Also seen by GI due to severe transaminitis, currently advising to monitor.  LFTs are improving.  PT consulted.  Assessment & Plan:   Active Problems:   Acute respiratory failure with hypoxia (HCC)   Acute hypoxic respiratory failure secondary to COVID-19 infection and aspiration pneumonia -Remains on room air.  Extubated 1/20.  Continue bronchodilators -Continue IV Unasyn for aspiration pneumonia.  Tentative last day 1/26, changed to Augmentin at discharge sooner - Procalcitonin 3.6 -Speech and swallow-on dysphagia 3 diet - Incentive spirometer, flutter valve -Currently on minimal O2 support  Unresponsive concerns of acute meningitis/encephalitis - LP negative.  Vancomycin, Zosyn and acyclovir discontinued.  CSF cultures negative. - CT head- negative for acute changes but does have chronic disease - EEG-diffuse encephalopathy, no seizures - UDS-negative -Echocardiogram -echocardiogram EF 65%, grade 1 DD -Currently with good mentation, conversing appropriately  Elevated Troponins -Chest pain-free.  EKG at baseline shows bundle branch block.  Echocardiogram EF 65%, grade 1 DD.  Troponins elevated but flat.  Seen by cardiology, recommend outpatient ischemic evaluation  AKI on CKD stage  IIIa - Admission creatinine 2.4.  Improving.  Creatinine 1.7 on 1/24.  -Cr is down to 1.67  Transaminitis, alcohol pattern.  Improving History of hepatitis C - Hep C antibodies positive, hep C viral load- undetected - Shock liver versus ischemic hepatitis versus alcoholic hepatitis. - Seen by [facility],  -HIV-negative - Abdominal ultrasound-unremarkable -LFT's now trending down. GI signed off  Mild rhabdomyolysis -Resolved with IV fluids  Diarrhea, nonspecific - C. difficile-negative.  Could be related to COVID-19 infection  Macrocytic anemia - Suspect from underlying alcohol use - B12, TSH-normal.   - Iron studies shows low saturation, elevated ferritin.  History of esophagitis, ulcerative - Continue PPI as toelrated  Polysubstance abuse - Will need cessation  History of DVT PE LLE, Chronic - Used to be on anticoagulation?Marland Kitchen  Ultrasound Dopplers discussed that shows chronic DVT.  Previously is completed course for Coumadin - Has history of laser ablation of his DVT  Hx of dilated aorta Seen by CT surgery October 2019.  Discussed blood pressure control and follow-up in 2 years with CTA chest.  Recommend f/u as outpatient  DVT prophylaxis: Heparin subq Code Status: Full Family Communication: Pt in room, family not at bedside  Status is: Inpatient  Remains inpatient appropriate because:Unsafe d/c plan and Inpatient level of care appropriate due to severity of illness   Dispo: The patient is from: Homeless              Anticipated d/c is to: Pending TOC work up              Anticipated d/c date is: 2 days              Patient currently is medically stable to d/c. Pending safe dispo   Difficult to  place patient Yes   Consultants:   GI  Procedures:     Antimicrobials: Anti-infectives (From admission, onward)   Start     Dose/Rate Route Frequency Ordered Stop   09/08/20 1800  Ampicillin-Sulbactam (UNASYN) 3 g in sodium chloride 0.9 % 100 mL IVPB         3 g 200 mL/hr over 30 Minutes Intravenous Every 8 hours 09/08/20 1314 09/09/20 1748   09/04/20 2000  acyclovir (ZOVIRAX) 640 mg in dextrose 5 % 100 mL IVPB  Status:  Discontinued        10 mg/kg  63.8 kg (Ideal) 112.8 mL/hr over 60 Minutes Intravenous Every 12 hours 09/04/20 0826 09/04/20 0907   09/04/20 1000  Ampicillin-Sulbactam (UNASYN) 3 g in sodium chloride 0.9 % 100 mL IVPB  Status:  Discontinued        3 g 200 mL/hr over 30 Minutes Intravenous Every 8 hours 09/04/20 0907 09/08/20 1315   09/03/20 2200  piperacillin-tazobactam (ZOSYN) IVPB 3.375 g  Status:  Discontinued        3.375 g 12.5 mL/hr over 240 Minutes Intravenous Every 8 hours 09/03/20 1858 09/03/20 1928   09/03/20 2002  vancomycin variable dose per unstable renal function (pharmacist dosing)  Status:  Discontinued         Does not apply See admin instructions 09/03/20 2002 09/04/20 0907   09/03/20 2000  acyclovir (ZOVIRAX) 1,000 mg in dextrose 5 % 150 mL IVPB  Status:  Discontinued        10 mg/kg  100 kg 170 mL/hr over 60 Minutes Intravenous Every 12 hours 09/03/20 1937 09/04/20 0826   09/03/20 1930  piperacillin-tazobactam (ZOSYN) IVPB 3.375 g  Status:  Discontinued        3.375 g 12.5 mL/hr over 240 Minutes Intravenous Every 8 hours 09/03/20 1928 09/04/20 0907   09/03/20 1815  ceFEPIme (MAXIPIME) 2 g in sodium chloride 0.9 % 100 mL IVPB  Status:  Discontinued        2 g 200 mL/hr over 30 Minutes Intravenous  Once 09/03/20 1801 09/03/20 1828   09/03/20 1815  metroNIDAZOLE (FLAGYL) IVPB 500 mg  Status:  Discontinued        500 mg 100 mL/hr over 60 Minutes Intravenous  Once 09/03/20 1801 09/03/20 1828   09/03/20 1815  vancomycin (VANCOCIN) IVPB 1000 mg/200 mL premix  Status:  Discontinued        1,000 mg 200 mL/hr over 60 Minutes Intravenous  Once 09/03/20 1801 09/03/20 1812   09/03/20 1815  vancomycin (VANCOREADY) IVPB 2000 mg/400 mL        2,000 mg 200 mL/hr over 120 Minutes Intravenous  Once 09/03/20 1812 09/03/20  2326      Subjective: No complaints at this time  Objective: Vitals:   09/10/20 2131 09/11/20 0500 09/11/20 0603 09/11/20 1416  BP: 135/87  116/76 (!) 126/100  Pulse: 76  80 76  Resp: 16  16 16   Temp:   99.3 F (37.4 C)   TempSrc:      SpO2: 93%  (!) 88% 91%  Weight:  77.9 kg    Height:        Intake/Output Summary (Last 24 hours) at 09/11/2020 1547 Last data filed at 09/11/2020 0604 Gross per 24 hour  Intake --  Output 600 ml  Net -600 ml   Filed Weights   09/04/20 0339 09/06/20 0500 09/11/20 0500  Weight: 89 kg 83.8 kg 77.9 kg    Examination: General exam: Awake, laying in  bed, in nad Respiratory system: Normal respiratory effort, no audible wheezing Cardiovascular system: perfused, no notable jvd Gastrointestinal system: Soft, nondistended, positive BS Central nervous system: CN2-12 grossly intact, strength intact Extremities: Perfused, no clubbing Skin: Normal skin turgor, no notable skin lesions seen Psychiatry: Mood normal // no visual hallucinations   Data Reviewed: I have personally reviewed following labs and imaging studies  CBC: Recent Labs  Lab 09/07/20 0336 09/08/20 0452 09/09/20 1021 09/10/20 0411 09/11/20 0658  WBC 3.3* 3.3* 3.7* 4.4 4.2  HGB 11.8* 12.5* 13.0 12.8* 14.5  HCT 34.8* 38.3* 39.7 39.6 43.3  MCV 93.5 93.9 93.4 93.6 93.9  PLT 91* 79* 82* 95* 272*   Basic Metabolic Panel: Recent Labs  Lab 09/06/20 0500 09/07/20 0336 09/08/20 0452 09/09/20 1021 09/10/20 0411 09/11/20 0658  NA 140 138 136  --   --  143  K 3.0* 3.9 4.1  --   --  4.3  CL 109 110 109  --   --  113*  CO2 19* 18* 16*  --   --  18*  GLUCOSE 91 130* 101*  --   --  92  BUN 16 15 13   --   --  19  CREATININE 2.02* 1.92* 1.70*  --   --  1.67*  CALCIUM 8.0* 8.1* 8.1*  --   --  9.1  MG 1.7 2.2 1.8 1.8 1.8  --    GFR: Estimated Creatinine Clearance: 43.9 mL/min (A) (by C-G formula based on SCr of 1.67 mg/dL (H)). Liver Function Tests: Recent Labs  Lab  09/06/20 0500 09/07/20 0336 09/08/20 0452 09/09/20 1021 09/11/20 0658  AST 8,396* 1,670* 511* 195* 106*  ALT 2,465* 1,584* 1,025* 655* 358*  ALKPHOS 69 61 65 56 58  BILITOT 1.2 0.7 1.2 0.6 0.7  PROT 6.2* 6.2* 6.6 6.8 7.8  ALBUMIN 2.9* 2.7* 2.9* 2.7* 2.9*   No results for input(s): LIPASE, AMYLASE in the last 168 hours. No results for input(s): AMMONIA in the last 168 hours. Coagulation Profile: Recent Labs  Lab 09/06/20 1158 09/09/20 1021  INR 1.2 1.1   Cardiac Enzymes: Recent Labs  Lab 09/05/20 0815 09/06/20 0500 09/07/20 0336 09/08/20 0452  CKTOTAL 922* 725* 347 181   BNP (last 3 results) No results for input(s): PROBNP in the last 8760 hours. HbA1C: No results for input(s): HGBA1C in the last 72 hours. CBG: Recent Labs  Lab 09/10/20 1151 09/10/20 1539 09/10/20 2149 09/11/20 0722 09/11/20 1135  GLUCAP 95 94 97 74 127*   Lipid Profile: No results for input(s): CHOL, HDL, LDLCALC, TRIG, CHOLHDL, LDLDIRECT in the last 72 hours. Thyroid Function Tests: No results for input(s): TSH, T4TOTAL, FREET4, T3FREE, THYROIDAB in the last 72 hours. Anemia Panel: No results for input(s): VITAMINB12, FOLATE, FERRITIN, TIBC, IRON, RETICCTPCT in the last 72 hours. Sepsis Labs: No results for input(s): PROCALCITON, LATICACIDVEN in the last 168 hours.  Recent Results (from the past 240 hour(s))  Urine culture     Status: Abnormal   Collection Time: 09/03/20  5:05 PM   Specimen: Urine, Random  Result Value Ref Range Status   Specimen Description URINE, RANDOM  Final   Special Requests NONE  Final   Culture (A)  Final    <10,000 COLONIES/mL INSIGNIFICANT GROWTH Performed at [facility], [address], [place], [place] [address]    Report Status 09/05/2020 FINAL  Final  Resp Panel by RT-PCR (Flu A&B, Covid) Nasopharyngeal Swab     Status: Abnormal   Collection  Time: 09/03/20  5:05 PM   Specimen: Nasopharyngeal Swab; Nasopharyngeal(NP) swabs in vial transport  medium  Result Value Ref Range Status   SARS Coronavirus 2 by RT PCR POSITIVE (A) NEGATIVE Final    Comment: RESULT CALLED TO, READ BACK BY AND VERIFIED WITH: A BANKS RN [id number] 09/03/20 A BROWNING (NOTE) SARS-CoV-2 target nucleic acids are DETECTED.  The SARS-CoV-2 RNA is generally detectable in upper respiratory specimens during the acute phase of infection. Positive results are indicative of the presence of the identified virus, but do not rule out bacterial infection or co-infection with other pathogens not detected by the test. Clinical correlation with patient history and other diagnostic information is necessary to determine patient infection status. The expected result is Negative.  Fact Sheet for Patients: EntrepreneurPulse.com.au  Fact Sheet for Healthcare Providers: IncredibleEmployment.be  This test is not yet approved or cleared by the [place] FDA and  has been authorized for detection and/or diagnosis of SARS-CoV-2 by FDA under an Emergency Use Authorization (EUA).  This EUA will remain in effect (meaning this test can b e used) for the duration of  the COVID-19 declaration under Section 564(b)(1) of the Act, 21 U.S.C. section 360bbb-3(b)(1), unless the authorization is terminated or revoked sooner.     Influenza A by PCR NEGATIVE NEGATIVE Final   Influenza B by PCR NEGATIVE NEGATIVE Final    Comment: (NOTE) The Xpert Xpress SARS-CoV-2/FLU/RSV plus assay is intended as an aid in the diagnosis of influenza from Nasopharyngeal swab specimens and should not be used as a sole basis for treatment. Nasal washings and aspirates are unacceptable for Xpert Xpress SARS-CoV-2/FLU/RSV testing.  Fact Sheet for Patients: EntrepreneurPulse.com.au  Fact Sheet for Healthcare Providers: IncredibleEmployment.be  This test is not yet approved or cleared by the [place] FDA and has been authorized for  detection and/or diagnosis of SARS-CoV-2 by FDA under an Emergency Use Authorization (EUA). This EUA will remain in effect (meaning this test can be used) for the duration of the COVID-19 declaration under Section 564(b)(1) of the Act, 21 U.S.C. section 360bbb-3(b)(1), unless the authorization is terminated or revoked.  Performed at [facility], [place] [address], [place], [place] [address]   CSF culture     Status: None   Collection Time: 09/03/20  6:11 PM   Specimen: CSF; Cerebrospinal Fluid  Result Value Ref Range Status   Specimen Description CSF  Final   Special Requests NONE  Final   Gram Stain   Final    WBC PRESENT, PREDOMINANTLY MONONUCLEAR NO ORGANISMS SEEN CYTOSPIN SMEAR    Culture   Final    NO GROWTH Performed at [facility], [place] [address] [place], [place] [address]    Report Status 09/07/2020 FINAL  Final  Culture, blood (routine x 2)     Status: None   Collection Time: 09/03/20  8:12 PM   Specimen: BLOOD RIGHT HAND  Result Value Ref Range Status   Specimen Description BLOOD RIGHT HAND  Final   Special Requests   Final    BOTTLES DRAWN AEROBIC AND ANAEROBIC Blood Culture results may not be optimal due to an inadequate volume of blood received in culture bottles   Culture   Final    NO GROWTH 5 DAYS Performed at [facility], [place] [address], [place], [place] [address]    Report Status 09/08/2020 FINAL  Final  Culture, blood (routine x 2)     Status: None   Collection Time: 09/04/20  3:48 AM   Specimen: BLOOD RIGHT HAND  Result Value Ref Range Status   Specimen Description BLOOD RIGHT HAND  Final   Special Requests   Final    BOTTLES DRAWN AEROBIC ONLY Blood Culture adequate volume   Culture   Final    NO GROWTH 5 DAYS Performed at [facility], [address], [place], [place] [address]    Report Status 09/09/2020 FINAL  Final  C Difficile Quick Screen w PCR reflex     Status: None   Collection Time: 09/05/20  2:11 PM    Specimen: STOOL  Result Value Ref Range Status   C Diff antigen NEGATIVE NEGATIVE Final   C Diff toxin NEGATIVE NEGATIVE Final   C Diff interpretation No C. difficile detected.  Final    Comment: Performed at [facility], [place] [address] [place], [place] [address]     Radiology Studies: No results found.  Scheduled Meds: . amLODipine  5 mg Oral Daily  . aspirin EC  81 mg Oral Daily  . Chlorhexidine Gluconate Cloth  6 each Topical Daily  . docusate  100 mg Per Tube BID  . folic acid  1 mg Per Tube Daily  . heparin  5,000 Units Subcutaneous Q8H  . insulin aspart  0-9 Units Subcutaneous TID WC & HS  . metoprolol tartrate  25 mg Oral BID  . multivitamin with minerals  1 tablet Per Tube Daily  . pantoprazole sodium  40 mg Per Tube QHS  . polyethylene glycol  17 g Per Tube Daily  . thiamine  100 mg Per Tube Daily   Continuous Infusions:   LOS: 8 days   Marylu Lund, MD [facility] Pager On Amion  If 7PM-7AM, please contact night-coverage 09/11/2020, 3:47 PM

## 2020-09-11 NOTE — Care Management Important Message (Signed)
Important Message  Patient Details  Name: Todd [name]. MRN: [id number] Date of Birth: 15-Feb-1956   Medicare Important Message Given:  Yes - Important Message mailed due to current National Emergency   Verbal consent obtained due to current National Emergency  Relationship to patient: Self Contact Name: Rafferty Postlewait Call Date: 09/11/20  Time: 3832 Phone: [phone] Outcome: No Answer/Busy Important Message mailed to: Patient address on file    [name] 09/11/2020, 1:46 PM

## 2020-09-12 DIAGNOSIS — J9602 Acute respiratory failure with hypercapnia: Secondary | ICD-10-CM | POA: Diagnosis not present

## 2020-09-12 DIAGNOSIS — R7401 Elevation of levels of liver transaminase levels: Secondary | ICD-10-CM | POA: Diagnosis not present

## 2020-09-12 DIAGNOSIS — U071 COVID-19: Secondary | ICD-10-CM | POA: Diagnosis not present

## 2020-09-12 DIAGNOSIS — J9601 Acute respiratory failure with hypoxia: Secondary | ICD-10-CM | POA: Diagnosis not present

## 2020-09-12 LAB — GLUCOSE, CAPILLARY
Glucose-Capillary: 97 mg/dL (ref 70–99)
Glucose-Capillary: 93 mg/dL (ref 70–99)
Glucose-Capillary: 99 mg/dL (ref 70–99)
Glucose-Capillary: 108 mg/dL — ABNORMAL HIGH (ref 70–99)

## 2020-09-12 MED ORDER — DOCUSATE SODIUM 100 MG PO CAPS
100.0000 mg | ORAL_CAPSULE | Freq: Two times a day (BID) | ORAL | Status: DC
  Administered 2020-09-12 – 2020-09-28 (×26): 100 mg via ORAL
  Filled 2020-09-12 (×28): qty 1

## 2020-09-12 MED ORDER — THIAMINE HCL 100 MG PO TABS
100.0000 mg | ORAL_TABLET | Freq: Every day | ORAL | Status: DC
  Administered 2020-09-13 – 2020-09-29 (×17): 100 mg via ORAL
  Filled 2020-09-12 (×17): qty 1

## 2020-09-12 MED ORDER — FOLIC ACID 1 MG PO TABS
1.0000 mg | ORAL_TABLET | Freq: Every day | ORAL | Status: DC
  Administered 2020-09-13 – 2020-09-29 (×17): 1 mg via ORAL
  Filled 2020-09-12 (×17): qty 1

## 2020-09-12 MED ORDER — ADULT MULTIVITAMIN W/MINERALS CH
1.0000 | ORAL_TABLET | Freq: Every day | ORAL | Status: DC
  Administered 2020-09-13 – 2020-09-29 (×17): 1 via ORAL
  Filled 2020-09-12 (×17): qty 1

## 2020-09-12 MED ORDER — POLYETHYLENE GLYCOL 3350 17 G PO PACK: 17.0000 g | PACK | Freq: Every day | ORAL | Status: DC | PRN

## 2020-09-12 MED ORDER — POLYETHYLENE GLYCOL 3350 17 G PO PACK
17.0000 g | PACK | Freq: Every day | ORAL | Status: DC
  Administered 2020-09-16 – 2020-09-24 (×2): 17 g via ORAL
  Filled 2020-09-12 (×6): qty 1

## 2020-09-12 MED ORDER — PANTOPRAZOLE SODIUM 40 MG PO TBEC
40.0000 mg | DELAYED_RELEASE_TABLET | Freq: Every day | ORAL | Status: DC
  Administered 2020-09-12 – 2020-09-28 (×16): 40 mg via ORAL
  Filled 2020-09-12 (×17): qty 1

## 2020-09-12 NOTE — Progress Notes (Signed)
Patient is refusing care this morning; morning medications and bathing. His bed and gown are soaked with urine. Three times he has refused to be cleaned and changed this morning. Nurse and nursing staff have both offered and insisted to change patient but he demands that we do not.

## 2020-09-12 NOTE — Progress Notes (Signed)
PROGRESS NOTE    Todd [name].  SS:[id number] DOB: 01/17/1956 DOA: 09/03/2020 PCP: [name], MD    Brief Narrative:  65 year old with history of DM2, HTN, CKD, alcohol/tobacco and heroin abuse presented from homeless shelter when he was found down and intubated in the ER.  There was suspicion for seizure-like activity after intubation.  Also found to have COVID-19.  CT head showed bilateral chronic infarct, moderate cerebellar disease but nothing acute.  EEG showed diffuse encephalopathy.  LP was negative for meningitis.  Initially received vancomycin, Zosyn and acyclovir which was later transitioned to Unasyn for aspiration pneumonia.  Seen by cardiology due to elevated troponin.  Also seen by GI due to severe transaminitis, currently advising to monitor.  LFTs are improving.  PT consulted.  Assessment & Plan:   Active Problems:   Acute respiratory failure with hypoxia (HCC)   Acute hypoxic respiratory failure secondary to COVID-19 infection and aspiration pneumonia -Remains on room air.  Extubated 1/20.  Continue bronchodilators -Continue IV Unasyn for aspiration pneumonia.  Tentative last day 1/26, changed to Augmentin at discharge  - Procalcitonin 3.6 on last check -Speech and swallow-on dysphagia 3 diet - Incentive spirometer, flutter valve -Remains on minimal O2 support  Unresponsive concerns of acute meningitis/encephalitis - LP negative.  Vancomycin, Zosyn and acyclovir discontinued.  CSF cultures negative. - CT head- negative for acute changes but does have chronic disease - EEG-diffuse encephalopathy, no seizures - UDS-negative -Echocardiogram -echocardiogram EF 65%, grade 1 DD -Remains with good mentation, conversing appropriately  Elevated Troponins -Chest pain-free.  EKG at baseline shows bundle branch block.  Echocardiogram EF 65%, grade 1 DD.  Troponins elevated but flat.  Seen by cardiology, recommend outpatient ischemic evaluation  AKI on CKD stage  IIIa - Admission creatinine 2.4.  Improving.  Creatinine 1.7 on 1/24.  -Cr is down to 1.67 on last check  Transaminitis, alcohol pattern.  Improving History of hepatitis C - Hep C antibodies positive, hep C viral load- undetected - Shock liver versus ischemic hepatitis versus alcoholic hepatitis. - Seen by [facility],  -HIV noted to be negative - Abdominal ultrasound-unremarkable -LFT's now trending down. GI signed off  Mild rhabdomyolysis -Resolved with IV fluids  Diarrhea, nonspecific - C. difficile-negative.  Could be related to COVID-19 infection  Macrocytic anemia - Suspect from underlying alcohol use - B12, TSH-normal.   - Iron studies shows low saturation, elevated ferritin.  History of esophagitis, ulcerative - Continue PPI as toelrated  Polysubstance abuse - Will need cessation  History of DVT PE LLE, Chronic - Used to be on anticoagulation?Marland Kitchen  Ultrasound Dopplers discussed that shows chronic DVT.  Previously is completed course for Coumadin  - Has history of laser ablation of his DVT  Hx of dilated aorta Seen by CT surgery October 2019.  Discussed blood pressure control and follow-up in 2 years with CTA chest.   -Recommend f/u as outpatient  DVT prophylaxis: Heparin subq Code Status: Full Family Communication: Pt in room, family not at bedside  Status is: Inpatient  Remains inpatient appropriate because:Unsafe d/c plan and Inpatient level of care appropriate due to severity of illness   Dispo: The patient is from: Homeless              Anticipated d/c is to: Pending TOC work up              Anticipated d/c date is: 2 days              Patient currently  is medically stable to d/c. Pending safe dispo   Difficult to place patient Yes   Consultants:   GI  Procedures:     Antimicrobials: Anti-infectives (From admission, onward)   Start     Dose/Rate Route Frequency Ordered Stop   09/08/20 1800  Ampicillin-Sulbactam (UNASYN) 3 g in sodium  chloride 0.9 % 100 mL IVPB        3 g 200 mL/hr over 30 Minutes Intravenous Every 8 hours 09/08/20 1314 09/09/20 1748   09/04/20 2000  acyclovir (ZOVIRAX) 640 mg in dextrose 5 % 100 mL IVPB  Status:  Discontinued        10 mg/kg  63.8 kg (Ideal) 112.8 mL/hr over 60 Minutes Intravenous Every 12 hours 09/04/20 0826 09/04/20 0907   09/04/20 1000  Ampicillin-Sulbactam (UNASYN) 3 g in sodium chloride 0.9 % 100 mL IVPB  Status:  Discontinued        3 g 200 mL/hr over 30 Minutes Intravenous Every 8 hours 09/04/20 0907 09/08/20 1315   09/03/20 2200  piperacillin-tazobactam (ZOSYN) IVPB 3.375 g  Status:  Discontinued        3.375 g 12.5 mL/hr over 240 Minutes Intravenous Every 8 hours 09/03/20 1858 09/03/20 1928   09/03/20 2002  vancomycin variable dose per unstable renal function (pharmacist dosing)  Status:  Discontinued         Does not apply See admin instructions 09/03/20 2002 09/04/20 0907   09/03/20 2000  acyclovir (ZOVIRAX) 1,000 mg in dextrose 5 % 150 mL IVPB  Status:  Discontinued        10 mg/kg  100 kg 170 mL/hr over 60 Minutes Intravenous Every 12 hours 09/03/20 1937 09/04/20 0826   09/03/20 1930  piperacillin-tazobactam (ZOSYN) IVPB 3.375 g  Status:  Discontinued        3.375 g 12.5 mL/hr over 240 Minutes Intravenous Every 8 hours 09/03/20 1928 09/04/20 0907   09/03/20 1815  ceFEPIme (MAXIPIME) 2 g in sodium chloride 0.9 % 100 mL IVPB  Status:  Discontinued        2 g 200 mL/hr over 30 Minutes Intravenous  Once 09/03/20 1801 09/03/20 1828   09/03/20 1815  metroNIDAZOLE (FLAGYL) IVPB 500 mg  Status:  Discontinued        500 mg 100 mL/hr over 60 Minutes Intravenous  Once 09/03/20 1801 09/03/20 1828   09/03/20 1815  vancomycin (VANCOCIN) IVPB 1000 mg/200 mL premix  Status:  Discontinued        1,000 mg 200 mL/hr over 60 Minutes Intravenous  Once 09/03/20 1801 09/03/20 1812   09/03/20 1815  vancomycin (VANCOREADY) IVPB 2000 mg/400 mL        2,000 mg 200 mL/hr over 120 Minutes  Intravenous  Once 09/03/20 1812 09/03/20 2326      Subjective: No complaints at this time  Objective: Vitals:   09/11/20 2045 09/12/20 0500 09/12/20 0608 09/12/20 1400  BP: 119/86  117/71 118/79  Pulse: 90  92 77  Resp: 16  16 16   Temp: 99.4 F (37.4 C)  98.9 F (37.2 C)   TempSrc:      SpO2: 90%  91% 91%  Weight:  81.4 kg    Height:        Intake/Output Summary (Last 24 hours) at 09/12/2020 1610 Last data filed at 09/12/2020 1200 Gross per 24 hour  Intake 120 ml  Output 200 ml  Net -80 ml   Filed Weights   09/06/20 0500 09/11/20 0500 09/12/20 0500  Weight: 83.8  kg 77.9 kg 81.4 kg    Examination: General exam: Conversant, in no acute distress Respiratory system: normal chest rise, clear, no audible wheezing Cardiovascular system: regular rhythm, s1-s2 Gastrointestinal system: Nondistended, nontender, pos BS Central nervous system: No seizures, no tremors Extremities: No cyanosis, no joint deformities Skin: No rashes, no pallor Psychiatry: Affect normal // no auditory hallucinations   Data Reviewed: I have personally reviewed following labs and imaging studies  CBC: Recent Labs  Lab 09/07/20 0336 09/08/20 0452 09/09/20 1021 09/10/20 0411 09/11/20 0658  WBC 3.3* 3.3* 3.7* 4.4 4.2  HGB 11.8* 12.5* 13.0 12.8* 14.5  HCT 34.8* 38.3* 39.7 39.6 43.3  MCV 93.5 93.9 93.4 93.6 93.9  PLT 91* 79* 82* 95* 956*   Basic Metabolic Panel: Recent Labs  Lab 09/06/20 0500 09/07/20 0336 09/08/20 0452 09/09/20 1021 09/10/20 0411 09/11/20 0658  NA 140 138 136  --   --  143  K 3.0* 3.9 4.1  --   --  4.3  CL 109 110 109  --   --  113*  CO2 19* 18* 16*  --   --  18*  GLUCOSE 91 130* 101*  --   --  92  BUN 16 15 13   --   --  19  CREATININE 2.02* 1.92* 1.70*  --   --  1.67*  CALCIUM 8.0* 8.1* 8.1*  --   --  9.1  MG 1.7 2.2 1.8 1.8 1.8  --    GFR: Estimated Creatinine Clearance: 44.8 mL/min (A) (by C-G formula based on SCr of 1.67 mg/dL (H)). Liver Function  Tests: Recent Labs  Lab 09/06/20 0500 09/07/20 0336 09/08/20 0452 09/09/20 1021 09/11/20 0658  AST 8,396* 1,670* 511* 195* 106*  ALT 2,465* 1,584* 1,025* 655* 358*  ALKPHOS 69 61 65 56 58  BILITOT 1.2 0.7 1.2 0.6 0.7  PROT 6.2* 6.2* 6.6 6.8 7.8  ALBUMIN 2.9* 2.7* 2.9* 2.7* 2.9*   No results for input(s): LIPASE, AMYLASE in the last 168 hours. No results for input(s): AMMONIA in the last 168 hours. Coagulation Profile: Recent Labs  Lab 09/06/20 1158 09/09/20 1021  INR 1.2 1.1   Cardiac Enzymes: Recent Labs  Lab 09/06/20 0500 09/07/20 0336 09/08/20 0452  CKTOTAL 725* 347 181   BNP (last 3 results) No results for input(s): PROBNP in the last 8760 hours. HbA1C: No results for input(s): HGBA1C in the last 72 hours. CBG: Recent Labs  Lab 09/11/20 1135 09/11/20 1619 09/11/20 2047 09/12/20 0740 09/12/20 1144  GLUCAP 127* 91 221* 97 93   Lipid Profile: No results for input(s): CHOL, HDL, LDLCALC, TRIG, CHOLHDL, LDLDIRECT in the last 72 hours. Thyroid Function Tests: No results for input(s): TSH, T4TOTAL, FREET4, T3FREE, THYROIDAB in the last 72 hours. Anemia Panel: No results for input(s): VITAMINB12, FOLATE, FERRITIN, TIBC, IRON, RETICCTPCT in the last 72 hours. Sepsis Labs: No results for input(s): PROCALCITON, LATICACIDVEN in the last 168 hours.  Recent Results (from the past 240 hour(s))  Urine culture     Status: Abnormal   Collection Time: 09/03/20  5:05 PM   Specimen: Urine, Random  Result Value Ref Range Status   Specimen Description URINE, RANDOM  Final   Special Requests NONE  Final   Culture (A)  Final    <10,000 COLONIES/mL INSIGNIFICANT GROWTH Performed at [facility], [address] [place], [place] [address]    Report Status 09/05/2020 FINAL  Final  Resp Panel by RT-PCR (Flu A&B, Covid) Nasopharyngeal Swab  Status: Abnormal   Collection Time: 09/03/20  5:05 PM   Specimen: Nasopharyngeal Swab; Nasopharyngeal(NP) swabs in vial  transport medium  Result Value Ref Range Status   SARS Coronavirus 2 by RT PCR POSITIVE (A) NEGATIVE Final    Comment: RESULT CALLED TO, READ BACK BY AND VERIFIED WITH: A BANKS RN [id number] 09/03/20 A BROWNING (NOTE) SARS-CoV-2 target nucleic acids are DETECTED.  The SARS-CoV-2 RNA is generally detectable in upper respiratory specimens during the acute phase of infection. Positive results are indicative of the presence of the identified virus, but do not rule out bacterial infection or co-infection with other pathogens not detected by the test. Clinical correlation with patient history and other diagnostic information is necessary to determine patient infection status. The expected result is Negative.  Fact Sheet for Patients: EntrepreneurPulse.com.au  Fact Sheet for Healthcare Providers: IncredibleEmployment.be  This test is not yet approved or cleared by the [place] FDA and  has been authorized for detection and/or diagnosis of SARS-CoV-2 by FDA under an Emergency Use Authorization (EUA).  This EUA will remain in effect (meaning this test can b e used) for the duration of  the COVID-19 declaration under Section 564(b)(1) of the Act, 21 U.S.C. section 360bbb-3(b)(1), unless the authorization is terminated or revoked sooner.     Influenza A by PCR NEGATIVE NEGATIVE Final   Influenza B by PCR NEGATIVE NEGATIVE Final    Comment: (NOTE) The Xpert Xpress SARS-CoV-2/FLU/RSV plus assay is intended as an aid in the diagnosis of influenza from Nasopharyngeal swab specimens and should not be used as a sole basis for treatment. Nasal washings and aspirates are unacceptable for Xpert Xpress SARS-CoV-2/FLU/RSV testing.  Fact Sheet for Patients: EntrepreneurPulse.com.au  Fact Sheet for Healthcare Providers: IncredibleEmployment.be  This test is not yet approved or cleared by the [place] FDA and has been  authorized for detection and/or diagnosis of SARS-CoV-2 by FDA under an Emergency Use Authorization (EUA). This EUA will remain in effect (meaning this test can be used) for the duration of the COVID-19 declaration under Section 564(b)(1) of the Act, 21 U.S.C. section 360bbb-3(b)(1), unless the authorization is terminated or revoked.  Performed at [facility], [place] [address] [place], [place] [address]   CSF culture     Status: None   Collection Time: 09/03/20  6:11 PM   Specimen: CSF; Cerebrospinal Fluid  Result Value Ref Range Status   Specimen Description CSF  Final   Special Requests NONE  Final   Gram Stain   Final    WBC PRESENT, PREDOMINANTLY MONONUCLEAR NO ORGANISMS SEEN CYTOSPIN SMEAR    Culture   Final    NO GROWTH Performed at [facility], [place] [address], [place], [place] [address]    Report Status 09/07/2020 FINAL  Final  Culture, blood (routine x 2)     Status: None   Collection Time: 09/03/20  8:12 PM   Specimen: BLOOD RIGHT HAND  Result Value Ref Range Status   Specimen Description BLOOD RIGHT HAND  Final   Special Requests   Final    BOTTLES DRAWN AEROBIC AND ANAEROBIC Blood Culture results may not be optimal due to an inadequate volume of blood received in culture bottles   Culture   Final    NO GROWTH 5 DAYS Performed at [facility], [place] [address] [place], [place] [address]    Report Status 09/08/2020 FINAL  Final  Culture, blood (routine x 2)     Status: None  Collection Time: 09/04/20  3:48 AM   Specimen: BLOOD RIGHT HAND  Result Value Ref Range Status   Specimen Description BLOOD RIGHT HAND  Final   Special Requests   Final    BOTTLES DRAWN AEROBIC ONLY Blood Culture adequate volume   Culture   Final    NO GROWTH 5 DAYS Performed at [facility], [address], [place], [place] [address]    Report Status 09/09/2020 FINAL  Final  C Difficile Quick Screen w PCR reflex     Status: None   Collection Time: 09/05/20   2:11 PM   Specimen: STOOL  Result Value Ref Range Status   C Diff antigen NEGATIVE NEGATIVE Final   C Diff toxin NEGATIVE NEGATIVE Final   C Diff interpretation No C. difficile detected.  Final    Comment: Performed at [facility], [place] [address] [place], [place] [address]     Radiology Studies: No results found.  Scheduled Meds: . amLODipine  5 mg Oral Daily  . aspirin EC  81 mg Oral Daily  . Chlorhexidine Gluconate Cloth  6 each Topical Daily  . docusate  100 mg Per Tube BID  . folic acid  1 mg Per Tube Daily  . heparin  5,000 Units Subcutaneous Q8H  . insulin aspart  0-9 Units Subcutaneous TID WC & HS  . metoprolol tartrate  25 mg Oral BID  . multivitamin with minerals  1 tablet Per Tube Daily  . pantoprazole sodium  40 mg Per Tube QHS  . polyethylene glycol  17 g Per Tube Daily  . thiamine  100 mg Per Tube Daily   Continuous Infusions:   LOS: 9 days   Marylu Lund, MD [facility] Pager On Amion  If 7PM-7AM, please contact night-coverage 09/12/2020, 4:10 PM

## 2020-09-12 NOTE — Progress Notes (Signed)
Physical Therapy Treatment Patient Details Name: Todd [name]. MRN: [id number] DOB: 1955-11-07 Today's Date: 09/12/2020    History of Present Illness 65 year old with history of DM2, HTN, CKD, alcohol/tobacco and heroin abuse, Hepatitis C presented 09/03/20 from homeless shelter when he was found down. Intubated 1/19-1/20.  There was suspicion for seizure-like activity after intubation. UDS negative.   CT head showed bilateral chronic infarct, moderate cerebellar disease but nothing acute.  EEG showed diffuse encephalopathy.  LP was negative for meningitis; Also found to have COVID-19.+aspiration pneumonia;    PT Comments    Pt remains confused with linens soaked in urine and ultimately agreeable to sit in chair and change all linens. Pt initially pleasant and agreeable to walk with rollator but then refused once EOB. Pt with noted nystagmus with right and left neck rotation in supine but unable to participate for further assessment. Will continue to attempt therapy as pt willing to participate.    Follow Up Recommendations  SNF     Equipment Recommendations  Rolling walker with 5" wheels    Recommendations for Other Services       Precautions / Restrictions Precautions Precautions: Fall Restrictions Weight Bearing Restrictions: No    Mobility  Bed Mobility Overal bed mobility: Needs Assistance Bed Mobility: Supine to Sit;Sit to Supine     Supine to sit: Min assist Sit to supine: Supervision   General bed mobility comments: bed flat with pt able to rise with min HHA, return to supine without assist x 2 trials. Withr return to bed on right side noted rotational beating nystagmus grossly 30 sec with subsided with head in neutral but also with presentation with left neck rotation. Pt refused further positional changes or assessment  Transfers Overall transfer level: Needs assistance   Transfers: Sit to/from Stand Sit to Stand: Min guard         General transfer  comment: pt able to stand from bed, pivot to and from recliner with and without UE support with noted unsteadiness but agitation with offering of support  Ambulation/Gait             General Gait Details: pt refused despite rollator present   Stairs             Wheelchair Mobility    Modified Rankin (Stroke Patients Only)       Balance Overall balance assessment: Needs assistance   Sitting balance-Leahy Scale: Fair       Standing balance-Leahy Scale: Poor                              Cognition Arousal/Alertness: Awake/alert Behavior During Therapy: Flat affect;Agitated Overall Cognitive Status: No family/caregiver present to determine baseline cognitive functioning Area of Impairment: Safety/judgement;Following commands;Problem solving                 Orientation Level: Situation Current Attention Level: Sustained Memory: Decreased short-term memory Following Commands: Follows one step commands with increased time;Follows one step commands inconsistently Safety/Judgement: Decreased awareness of deficits;Decreased awareness of safety   Problem Solving: Slow processing General Comments: pt initially agreeable to therapy with rollator present, once sitting EOB refused then agreed again. Pt more pleasant this session but continues to have frequent bursts of agitation and very limited willingness to answer questions or follow cues      Exercises      General Comments        Pertinent Vitals/Pain Faces Pain Scale: Hurts a  little bit Pain Location: left knee Pain Descriptors / Indicators: Aching Pain Intervention(s): Limited activity within patient's tolerance;Repositioned    Home Living                      Prior Function            PT Goals (current goals can now be found in the care plan section) Progress towards PT goals: Progressing toward goals    Frequency    Min 2X/week      PT Plan Current plan remains  appropriate    Co-evaluation              AM-PAC PT "6 Clicks" Mobility   Outcome Measure  Help needed turning from your back to your side while in a flat bed without using bedrails?: None Help needed moving from lying on your back to sitting on the side of a flat bed without using bedrails?: A Little Help needed moving to and from a bed to a chair (including a wheelchair)?: A Little Help needed standing up from a chair using your arms (e.g., wheelchair or bedside chair)?: A Little Help needed to walk in hospital room?: A Little Help needed climbing 3-5 steps with a railing? : Total 6 Click Score: 17    End of Session   Activity Tolerance: Treatment limited secondary to agitation Patient left: in bed;with call bell/phone within reach;with bed alarm set Nurse Communication: Mobility status PT Visit Diagnosis: Muscle weakness (generalized) (M62.81);Other abnormalities of gait and mobility (R26.89);Pain     Time: 2725-3664 PT Time Calculation (min) (ACUTE ONLY): 30 min  Charges:  $Therapeutic Activity: 23-37 mins                     Maija P, PT Acute Rehabilitation Services Pager: [phone] Office: [place] 09/12/2020, 1:42 PM

## 2020-09-13 DIAGNOSIS — J9601 Acute respiratory failure with hypoxia: Secondary | ICD-10-CM | POA: Diagnosis not present

## 2020-09-13 DIAGNOSIS — J9602 Acute respiratory failure with hypercapnia: Secondary | ICD-10-CM | POA: Diagnosis not present

## 2020-09-13 DIAGNOSIS — R7401 Elevation of levels of liver transaminase levels: Secondary | ICD-10-CM | POA: Diagnosis not present

## 2020-09-13 LAB — GLUCOSE, CAPILLARY
Glucose-Capillary: 97 mg/dL (ref 70–99)
Glucose-Capillary: 93 mg/dL (ref 70–99)
Glucose-Capillary: 88 mg/dL (ref 70–99)

## 2020-09-13 NOTE — Progress Notes (Signed)
PROGRESS NOTE    Todd [name].  SS:[id number] DOB: 1956/03/16 DOA: 09/03/2020 PCP: [name], MD    Brief Narrative:  65 year old with history of DM2, HTN, CKD, alcohol/tobacco and heroin abuse presented from homeless shelter when he was found down and intubated in the ER.  There was suspicion for seizure-like activity after intubation.  Also found to have COVID-19.  CT head showed bilateral chronic infarct, moderate cerebellar disease but nothing acute.  EEG showed diffuse encephalopathy.  LP was negative for meningitis.  Initially received vancomycin, Zosyn and acyclovir which was later transitioned to Unasyn for aspiration pneumonia.  Seen by cardiology due to elevated troponin.  Also seen by GI due to severe transaminitis, currently advising to monitor.  LFTs are improving.  PT consulted.  Assessment & Plan:   Active Problems:   Acute respiratory failure with hypoxia (HCC)   Acute hypoxic respiratory failure secondary to COVID-19 infection and aspiration pneumonia -Remains on room air.  Extubated 1/20.  Continue bronchodilators -Continue IV Unasyn for aspiration pneumonia.  Tentative last day 1/26, changed to Augmentin at discharge  - Procalcitonin 3.6 on last check -Speech and swallow-on dysphagia 3 diet - Incentive spirometer, flutter valve -Remains on minimal O2 support  Unresponsive concerns of acute meningitis/encephalitis - LP negative.  Vancomycin, Zosyn and acyclovir discontinued.  CSF cultures negative. - CT head- negative for acute changes but does have chronic disease - EEG-diffuse encephalopathy, no seizures - UDS noted to be neg -Echocardiogram -echocardiogram EF 65%, grade 1 DD -Remains with good mentation, conversing appropriately  Elevated Troponins -Chest pain-free.  EKG at baseline shows bundle branch block.  Echocardiogram EF 65%, grade 1 DD.  Troponins elevated but flat.  Seen by cardiology, recommendation for outpatient ischemic  evaluation  AKI on CKD stage IIIa - Admission creatinine 2.4.  Improving.  Creatinine 1.7 on 1/24.  - Cr noted to be 1.67 on last check  Transaminitis, alcohol pattern.  Improving History of hepatitis C - Hep C antibodies positive, hep C viral load- undetected - Shock liver versus ischemic hepatitis versus alcoholic hepatitis. - Seen by [facility],  -HIV noted to be negative - Abdominal ultrasound-unremarkable -LFT's now trending down. GI has signed off  Mild rhabdomyolysis -Resolved with IV fluids  Diarrhea, nonspecific - C. difficile-negative.  Could be related to COVID-19 infection  Macrocytic anemia - Suspect from underlying alcohol use - B12, TSH-normal.   - Iron studies shows low saturation, elevated ferritin.  History of esophagitis, ulcerative - Continue PPI as toelrated  Polysubstance abuse - Will need cessation  History of DVT PE LLE, Chronic - Used to be on anticoagulation?Marland Kitchen  Ultrasound Dopplers discussed that shows chronic DVT.  Previously is completed course for Coumadin  - Has history of laser ablation of his DVT  Hx of dilated aorta Seen by CT surgery October 2019.  Discussed blood pressure control and follow-up in 2 years with CTA chest.   -Recommend f/u as outpatient  DVT prophylaxis: Heparin subq Code Status: Full Family Communication: Pt in room, family not at bedside  Status is: Inpatient  Remains inpatient appropriate because:Unsafe d/c plan and Inpatient level of care appropriate due to severity of illness   Dispo: The patient is from: Homeless              Anticipated d/c is to: Pending TOC work up              Anticipated d/c date is: 2 days  Patient currently is medically stable to d/c. Pending safe dispo   Difficult to place patient Yes   Consultants:   GI  Procedures:     Antimicrobials: Anti-infectives (From admission, onward)   Start     Dose/Rate Route Frequency Ordered Stop   09/08/20 1800   Ampicillin-Sulbactam (UNASYN) 3 g in sodium chloride 0.9 % 100 mL IVPB        3 g 200 mL/hr over 30 Minutes Intravenous Every 8 hours 09/08/20 1314 09/09/20 1748   09/04/20 2000  acyclovir (ZOVIRAX) 640 mg in dextrose 5 % 100 mL IVPB  Status:  Discontinued        10 mg/kg  63.8 kg (Ideal) 112.8 mL/hr over 60 Minutes Intravenous Every 12 hours 09/04/20 0826 09/04/20 0907   09/04/20 1000  Ampicillin-Sulbactam (UNASYN) 3 g in sodium chloride 0.9 % 100 mL IVPB  Status:  Discontinued        3 g 200 mL/hr over 30 Minutes Intravenous Every 8 hours 09/04/20 0907 09/08/20 1315   09/03/20 2200  piperacillin-tazobactam (ZOSYN) IVPB 3.375 g  Status:  Discontinued        3.375 g 12.5 mL/hr over 240 Minutes Intravenous Every 8 hours 09/03/20 1858 09/03/20 1928   09/03/20 2002  vancomycin variable dose per unstable renal function (pharmacist dosing)  Status:  Discontinued         Does not apply See admin instructions 09/03/20 2002 09/04/20 0907   09/03/20 2000  acyclovir (ZOVIRAX) 1,000 mg in dextrose 5 % 150 mL IVPB  Status:  Discontinued        10 mg/kg  100 kg 170 mL/hr over 60 Minutes Intravenous Every 12 hours 09/03/20 1937 09/04/20 0826   09/03/20 1930  piperacillin-tazobactam (ZOSYN) IVPB 3.375 g  Status:  Discontinued        3.375 g 12.5 mL/hr over 240 Minutes Intravenous Every 8 hours 09/03/20 1928 09/04/20 0907   09/03/20 1815  ceFEPIme (MAXIPIME) 2 g in sodium chloride 0.9 % 100 mL IVPB  Status:  Discontinued        2 g 200 mL/hr over 30 Minutes Intravenous  Once 09/03/20 1801 09/03/20 1828   09/03/20 1815  metroNIDAZOLE (FLAGYL) IVPB 500 mg  Status:  Discontinued        500 mg 100 mL/hr over 60 Minutes Intravenous  Once 09/03/20 1801 09/03/20 1828   09/03/20 1815  vancomycin (VANCOCIN) IVPB 1000 mg/200 mL premix  Status:  Discontinued        1,000 mg 200 mL/hr over 60 Minutes Intravenous  Once 09/03/20 1801 09/03/20 1812   09/03/20 1815  vancomycin (VANCOREADY) IVPB 2000 mg/400 mL         2,000 mg 200 mL/hr over 120 Minutes Intravenous  Once 09/03/20 1812 09/03/20 2326      Subjective: Wants grape juice and ginger ale  Objective: Vitals:   09/12/20 1400 09/12/20 2145 09/13/20 0500 09/13/20 0613  BP: 118/79 129/75  110/77  Pulse: 77 88  92  Resp: 16 16  16   Temp:  (!) 101.1 F (38.4 C)  98.6 F (37 C)  TempSrc:      SpO2: 91% 90%  90%  Weight:   79.1 kg   Height:        Intake/Output Summary (Last 24 hours) at 09/13/2020 1617 Last data filed at 09/13/2020 0459 Gross per 24 hour  Intake 240 ml  Output 100 ml  Net 140 ml   Filed Weights   09/11/20 0500 09/12/20 0500 09/13/20  0500  Weight: 77.9 kg 81.4 kg 79.1 kg    Examination: General exam: Awake, laying in bed, in nad Respiratory system: Normal respiratory effort, no wheezing Cardiovascular system: regular rate, s1, s2 Gastrointestinal system: Soft, nondistended, positive BS Central nervous system: CN2-12 grossly intact, strength intact Extremities: Perfused, no clubbing Skin: Normal skin turgor, no notable skin lesions seen Psychiatry: Mood normal // no visual hallucinations   Data Reviewed: I have personally reviewed following labs and imaging studies  CBC: Recent Labs  Lab 09/07/20 0336 09/08/20 0452 09/09/20 1021 09/10/20 0411 09/11/20 0658  WBC 3.3* 3.3* 3.7* 4.4 4.2  HGB 11.8* 12.5* 13.0 12.8* 14.5  HCT 34.8* 38.3* 39.7 39.6 43.3  MCV 93.5 93.9 93.4 93.6 93.9  PLT 91* 79* 82* 95* 0000000*   Basic Metabolic Panel: Recent Labs  Lab 09/07/20 0336 09/08/20 0452 09/09/20 1021 09/10/20 0411 09/11/20 0658  NA 138 136  --   --  143  K 3.9 4.1  --   --  4.3  CL 110 109  --   --  113*  CO2 18* 16*  --   --  18*  GLUCOSE 130* 101*  --   --  92  BUN 15 13  --   --  19  CREATININE 1.92* 1.70*  --   --  1.67*  CALCIUM 8.1* 8.1*  --   --  9.1  MG 2.2 1.8 1.8 1.8  --    GFR: Estimated Creatinine Clearance: 44.2 mL/min (A) (by C-G formula based on SCr of 1.67 mg/dL (H)). Liver Function  Tests: Recent Labs  Lab 09/07/20 0336 09/08/20 0452 09/09/20 1021 09/11/20 0658  AST 1,670* 511* 195* 106*  ALT 1,584* 1,025* 655* 358*  ALKPHOS 61 65 56 58  BILITOT 0.7 1.2 0.6 0.7  PROT 6.2* 6.6 6.8 7.8  ALBUMIN 2.7* 2.9* 2.7* 2.9*   No results for input(s): LIPASE, AMYLASE in the last 168 hours. No results for input(s): AMMONIA in the last 168 hours. Coagulation Profile: Recent Labs  Lab 09/09/20 1021  INR 1.1   Cardiac Enzymes: Recent Labs  Lab 09/07/20 0336 09/08/20 0452  CKTOTAL 347 181   BNP (last 3 results) No results for input(s): PROBNP in the last 8760 hours. HbA1C: No results for input(s): HGBA1C in the last 72 hours. CBG: Recent Labs  Lab 09/12/20 1144 09/12/20 1624 09/12/20 2146 09/13/20 0819 09/13/20 1156  GLUCAP 93 99 108* 97 93   Lipid Profile: No results for input(s): CHOL, HDL, LDLCALC, TRIG, CHOLHDL, LDLDIRECT in the last 72 hours. Thyroid Function Tests: No results for input(s): TSH, T4TOTAL, FREET4, T3FREE, THYROIDAB in the last 72 hours. Anemia Panel: No results for input(s): VITAMINB12, FOLATE, FERRITIN, TIBC, IRON, RETICCTPCT in the last 72 hours. Sepsis Labs: No results for input(s): PROCALCITON, LATICACIDVEN in the last 168 hours.  Recent Results (from the past 240 hour(s))  Urine culture     Status: Abnormal   Collection Time: 09/03/20  5:05 PM   Specimen: Urine, Random  Result Value Ref Range Status   Specimen Description URINE, RANDOM  Final   Special Requests NONE  Final   Culture (A)  Final    <10,000 COLONIES/mL INSIGNIFICANT GROWTH Performed at [facility], [address] [place], [place] [address]    Report Status 09/05/2020 FINAL  Final  Resp Panel by RT-PCR (Flu A&B, Covid) Nasopharyngeal Swab     Status: Abnormal   Collection Time: 09/03/20  5:05 PM   Specimen: Nasopharyngeal Swab; Nasopharyngeal(NP)  swabs in vial transport medium  Result Value Ref Range Status   SARS Coronavirus 2 by RT PCR POSITIVE (A)  NEGATIVE Final    Comment: RESULT CALLED TO, READ BACK BY AND VERIFIED WITH: A BANKS RN [id number] 09/03/20 A BROWNING (NOTE) SARS-CoV-2 target nucleic acids are DETECTED.  The SARS-CoV-2 RNA is generally detectable in upper respiratory specimens during the acute phase of infection. Positive results are indicative of the presence of the identified virus, but do not rule out bacterial infection or co-infection with other pathogens not detected by the test. Clinical correlation with patient history and other diagnostic information is necessary to determine patient infection status. The expected result is Negative.  Fact Sheet for Patients: EntrepreneurPulse.com.au  Fact Sheet for Healthcare Providers: IncredibleEmployment.be  This test is not yet approved or cleared by the [place] FDA and  has been authorized for detection and/or diagnosis of SARS-CoV-2 by FDA under an Emergency Use Authorization (EUA).  This EUA will remain in effect (meaning this test can b e used) for the duration of  the COVID-19 declaration under Section 564(b)(1) of the Act, 21 U.S.C. section 360bbb-3(b)(1), unless the authorization is terminated or revoked sooner.     Influenza A by PCR NEGATIVE NEGATIVE Final   Influenza B by PCR NEGATIVE NEGATIVE Final    Comment: (NOTE) The Xpert Xpress SARS-CoV-2/FLU/RSV plus assay is intended as an aid in the diagnosis of influenza from Nasopharyngeal swab specimens and should not be used as a sole basis for treatment. Nasal washings and aspirates are unacceptable for Xpert Xpress SARS-CoV-2/FLU/RSV testing.  Fact Sheet for Patients: EntrepreneurPulse.com.au  Fact Sheet for Healthcare Providers: IncredibleEmployment.be  This test is not yet approved or cleared by the [place] FDA and has been authorized for detection and/or diagnosis of SARS-CoV-2 by FDA under an Emergency Use  Authorization (EUA). This EUA will remain in effect (meaning this test can be used) for the duration of the COVID-19 declaration under Section 564(b)(1) of the Act, 21 U.S.C. section 360bbb-3(b)(1), unless the authorization is terminated or revoked.  Performed at [facility], [place] [address], [place], [place] [address]   CSF culture     Status: None   Collection Time: 09/03/20  6:11 PM   Specimen: CSF; Cerebrospinal Fluid  Result Value Ref Range Status   Specimen Description CSF  Final   Special Requests NONE  Final   Gram Stain   Final    WBC PRESENT, PREDOMINANTLY MONONUCLEAR NO ORGANISMS SEEN CYTOSPIN SMEAR    Culture   Final    NO GROWTH Performed at [facility], [place] [address], [place], [place] [address]    Report Status 09/07/2020 FINAL  Final  Culture, blood (routine x 2)     Status: None   Collection Time: 09/03/20  8:12 PM   Specimen: BLOOD RIGHT HAND  Result Value Ref Range Status   Specimen Description BLOOD RIGHT HAND  Final   Special Requests   Final    BOTTLES DRAWN AEROBIC AND ANAEROBIC Blood Culture results may not be optimal due to an inadequate volume of blood received in culture bottles   Culture   Final    NO GROWTH 5 DAYS Performed at [facility], [place] [address], [place], [place] [address]    Report Status 09/08/2020 FINAL  Final  Culture, blood (routine x 2)     Status: None   Collection Time: 09/04/20  3:48 AM   Specimen: BLOOD RIGHT HAND  Result  Value Ref Range Status   Specimen Description BLOOD RIGHT HAND  Final   Special Requests   Final    BOTTLES DRAWN AEROBIC ONLY Blood Culture adequate volume   Culture   Final    NO GROWTH 5 DAYS Performed at [facility], [address], [place], [place] [address]    Report Status 09/09/2020 FINAL  Final  C Difficile Quick Screen w PCR reflex     Status: None   Collection Time: 09/05/20  2:11 PM   Specimen: STOOL  Result Value Ref Range Status   C Diff antigen NEGATIVE  NEGATIVE Final   C Diff toxin NEGATIVE NEGATIVE Final   C Diff interpretation No C. difficile detected.  Final    Comment: Performed at [facility], [place] [address], [place], [place] [address]     Radiology Studies: No results found.  Scheduled Meds: . amLODipine  5 mg Oral Daily  . aspirin EC  81 mg Oral Daily  . Chlorhexidine Gluconate Cloth  6 each Topical Daily  . docusate sodium  100 mg Oral BID  . folic acid  1 mg Oral Daily  . heparin  5,000 Units Subcutaneous Q8H  . insulin aspart  0-9 Units Subcutaneous TID WC & HS  . metoprolol tartrate  25 mg Oral BID  . multivitamin with minerals  1 tablet Oral Daily  . pantoprazole  40 mg Oral QHS  . polyethylene glycol  17 g Oral Daily  . thiamine  100 mg Oral Daily   Continuous Infusions:   LOS: 10 days   Marylu Lund, MD [facility] Pager On Amion  If 7PM-7AM, please contact night-coverage 09/13/2020, 4:17 PM

## 2020-09-13 NOTE — Progress Notes (Signed)
CNA attempted to check patient's blood sugar. Patient irritable and aggressive and refused.  Refused heparin when asked by RN.  Refused getting cleaned up today also.

## 2020-09-14 DIAGNOSIS — R7401 Elevation of levels of liver transaminase levels: Secondary | ICD-10-CM | POA: Diagnosis not present

## 2020-09-14 DIAGNOSIS — U071 COVID-19: Secondary | ICD-10-CM | POA: Diagnosis not present

## 2020-09-14 DIAGNOSIS — J9601 Acute respiratory failure with hypoxia: Secondary | ICD-10-CM | POA: Diagnosis not present

## 2020-09-14 LAB — GLUCOSE, CAPILLARY
Glucose-Capillary: 103 mg/dL — ABNORMAL HIGH (ref 70–99)
Glucose-Capillary: 106 mg/dL — ABNORMAL HIGH (ref 70–99)
Glucose-Capillary: 99 mg/dL (ref 70–99)
Glucose-Capillary: 90 mg/dL (ref 70–99)

## 2020-09-14 LAB — BRAIN NATRIURETIC PEPTIDE: B Natriuretic Peptide: 70.5 pg/mL (ref 0.0–100.0)

## 2020-09-14 NOTE — Progress Notes (Signed)
PROGRESS NOTE    Todd [name].  SS:[id number] DOB: Dec 19, 1955 DOA: 09/03/2020 PCP: [name], MD    Brief Narrative:  65 year old with history of DM2, HTN, CKD, alcohol/tobacco and heroin abuse presented from homeless shelter when he was found down and intubated in the ER.  There was suspicion for seizure-like activity after intubation.  Also found to have COVID-19.  CT head showed bilateral chronic infarct, moderate cerebellar disease but nothing acute.  EEG showed diffuse encephalopathy.  LP was negative for meningitis.  Initially received vancomycin, Zosyn and acyclovir which was later transitioned to Unasyn for aspiration pneumonia.  Seen by cardiology due to elevated troponin.  Also seen by GI due to severe transaminitis, currently advising to monitor.  LFTs are improving.  PT consulted.  Assessment & Plan:   Active Problems:   Acute respiratory failure with hypoxia (HCC)   Acute hypoxic respiratory failure secondary to COVID-19 infection and aspiration pneumonia -Remains on room air.  Extubated 1/20.  Continue bronchodilators -Completed course of IV Unasyn for aspiration pneumonia - Procalcitonin 3.6 on last check -Speech and swallow-on dysphagia 3 diet - Incentive spirometer, flutter valve -Currently remains on minimal O2 support -Covit test was pos on 1/19. Likely will remove isolation in the next 24hrs  Unresponsive concerns of acute meningitis/encephalitis - LP negative.  Vancomycin, Zosyn and acyclovir discontinued.  CSF cultures negative. - CT head- negative for acute changes but does have chronic disease - EEG-diffuse encephalopathy, no seizures - UDS noted to be neg -Echocardiogram -echocardiogram EF 65%, grade 1 DD -Remains with good mentation, conversing appropriately  Elevated Troponins -Chest pain-free.  EKG at baseline shows bundle branch block.  Echocardiogram EF 65%, grade 1 DD.  Troponins elevated but flat.  Seen by cardiology, recommendation for  outpatient ischemic evaluation on d/c  AKI on CKD stage IIIa - Admission creatinine 2.4.  Improving.  Creatinine 1.7 on 1/24.  - Cr noted to be 1.67 on last check  Transaminitis, alcohol pattern.  Improving History of hepatitis C - Hep C antibodies positive, hep C viral load- undetected - Shock liver versus ischemic hepatitis versus alcoholic hepatitis. - Seen by [facility],  -HIV noted to be negative - Abdominal ultrasound-unremarkable -LFT's noted to be trending down. GI has signed off  Mild rhabdomyolysis -Resolved with IV fluids  Diarrhea, nonspecific - C. difficile-negative.  Could be related to COVID-19 infection  Macrocytic anemia - Suspect from underlying alcohol use - B12, TSH-normal.   - Iron studies shows low saturation, elevated ferritin.  History of esophagitis, ulcerative - Continue PPI as toelrated  Polysubstance abuse - Will need cessation  History of DVT PE LLE, Chronic - Used to be on anticoagulation?Marland Kitchen  Ultrasound Dopplers discussed that shows chronic DVT.  Previously is completed course for Coumadin  - Has history of laser ablation of his DVT  Hx of dilated aorta Seen by CT surgery October 2019.  Discussed blood pressure control and follow-up in 2 years with CTA chest.   -Recommend f/u as outpatient on d/c   DVT prophylaxis: Heparin subq Code Status: Full Family Communication: Pt in room, family not at bedside  Status is: Inpatient  Remains inpatient appropriate because:Unsafe d/c plan and Inpatient level of care appropriate due to severity of illness   Dispo: The patient is from: Homeless              Anticipated d/c is to: Pending TOC work up              Anticipated  d/c date is: 2 days              Patient currently is medically stable to d/c. Pending safe dispo   Difficult to place patient Yes   Consultants:   GI  Procedures:     Antimicrobials: Anti-infectives (From admission, onward)   Start     Dose/Rate Route  Frequency Ordered Stop   09/08/20 1800  Ampicillin-Sulbactam (UNASYN) 3 g in sodium chloride 0.9 % 100 mL IVPB        3 g 200 mL/hr over 30 Minutes Intravenous Every 8 hours 09/08/20 1314 09/09/20 1748   09/04/20 2000  acyclovir (ZOVIRAX) 640 mg in dextrose 5 % 100 mL IVPB  Status:  Discontinued        10 mg/kg  63.8 kg (Ideal) 112.8 mL/hr over 60 Minutes Intravenous Every 12 hours 09/04/20 0826 09/04/20 0907   09/04/20 1000  Ampicillin-Sulbactam (UNASYN) 3 g in sodium chloride 0.9 % 100 mL IVPB  Status:  Discontinued        3 g 200 mL/hr over 30 Minutes Intravenous Every 8 hours 09/04/20 0907 09/08/20 1315   09/03/20 2200  piperacillin-tazobactam (ZOSYN) IVPB 3.375 g  Status:  Discontinued        3.375 g 12.5 mL/hr over 240 Minutes Intravenous Every 8 hours 09/03/20 1858 09/03/20 1928   09/03/20 2002  vancomycin variable dose per unstable renal function (pharmacist dosing)  Status:  Discontinued         Does not apply See admin instructions 09/03/20 2002 09/04/20 0907   09/03/20 2000  acyclovir (ZOVIRAX) 1,000 mg in dextrose 5 % 150 mL IVPB  Status:  Discontinued        10 mg/kg  100 kg 170 mL/hr over 60 Minutes Intravenous Every 12 hours 09/03/20 1937 09/04/20 0826   09/03/20 1930  piperacillin-tazobactam (ZOSYN) IVPB 3.375 g  Status:  Discontinued        3.375 g 12.5 mL/hr over 240 Minutes Intravenous Every 8 hours 09/03/20 1928 09/04/20 0907   09/03/20 1815  ceFEPIme (MAXIPIME) 2 g in sodium chloride 0.9 % 100 mL IVPB  Status:  Discontinued        2 g 200 mL/hr over 30 Minutes Intravenous  Once 09/03/20 1801 09/03/20 1828   09/03/20 1815  metroNIDAZOLE (FLAGYL) IVPB 500 mg  Status:  Discontinued        500 mg 100 mL/hr over 60 Minutes Intravenous  Once 09/03/20 1801 09/03/20 1828   09/03/20 1815  vancomycin (VANCOCIN) IVPB 1000 mg/200 mL premix  Status:  Discontinued        1,000 mg 200 mL/hr over 60 Minutes Intravenous  Once 09/03/20 1801 09/03/20 1812   09/03/20 1815  vancomycin  (VANCOREADY) IVPB 2000 mg/400 mL        2,000 mg 200 mL/hr over 120 Minutes Intravenous  Once 09/03/20 1812 09/03/20 2326      Subjective: Feeling weak  Objective: Vitals:   09/13/20 0500 09/13/20 0613 09/13/20 2013 09/14/20 0341  BP:  110/77 102/67 117/72  Pulse:  92 92 81  Resp:  16 14 16   Temp:  98.6 F (37 C) 98 F (36.7 C) 97.9 F (36.6 C)  TempSrc:   Axillary   SpO2:  90% 90% (!) 89%  Weight: 79.1 kg     Height:        Intake/Output Summary (Last 24 hours) at 09/14/2020 1614 Last data filed at 09/13/2020 2015 Gross per 24 hour  Intake 600 ml  Output  550 ml  Net 50 ml   Filed Weights   09/11/20 0500 09/12/20 0500 09/13/20 0500  Weight: 77.9 kg 81.4 kg 79.1 kg    Examination: General exam: Conversant, in no acute distress Respiratory system: normal chest rise, clear, no audible wheezing Cardiovascular system: regular rhythm, s1-s2 Gastrointestinal system: Nondistended, nontender, pos BS Central nervous system: No seizures, no tremors Extremities: No cyanosis, no joint deformities Skin: No rashes, no pallor Psychiatry: Affect normal // no auditory hallucinations   Data Reviewed: I have personally reviewed following labs and imaging studies  CBC: Recent Labs  Lab 09/08/20 0452 09/09/20 1021 09/10/20 0411 09/11/20 0658  WBC 3.3* 3.7* 4.4 4.2  HGB 12.5* 13.0 12.8* 14.5  HCT 38.3* 39.7 39.6 43.3  MCV 93.9 93.4 93.6 93.9  PLT 79* 82* 95* 269*   Basic Metabolic Panel: Recent Labs  Lab 09/08/20 0452 09/09/20 1021 09/10/20 0411 09/11/20 0658  NA 136  --   --  143  K 4.1  --   --  4.3  CL 109  --   --  113*  CO2 16*  --   --  18*  GLUCOSE 101*  --   --  92  BUN 13  --   --  19  CREATININE 1.70*  --   --  1.67*  CALCIUM 8.1*  --   --  9.1  MG 1.8 1.8 1.8  --    GFR: Estimated Creatinine Clearance: 44.2 mL/min (A) (by C-G formula based on SCr of 1.67 mg/dL (H)). Liver Function Tests: Recent Labs  Lab 09/08/20 0452 09/09/20 1021 09/11/20 0658   AST 511* 195* 106*  ALT 1,025* 655* 358*  ALKPHOS 65 56 58  BILITOT 1.2 0.6 0.7  PROT 6.6 6.8 7.8  ALBUMIN 2.9* 2.7* 2.9*   No results for input(s): LIPASE, AMYLASE in the last 168 hours. No results for input(s): AMMONIA in the last 168 hours. Coagulation Profile: Recent Labs  Lab 09/09/20 1021  INR 1.1   Cardiac Enzymes: Recent Labs  Lab 09/08/20 0452  CKTOTAL 181   BNP (last 3 results) No results for input(s): PROBNP in the last 8760 hours. HbA1C: No results for input(s): HGBA1C in the last 72 hours. CBG: Recent Labs  Lab 09/13/20 0819 09/13/20 1156 09/13/20 2056 09/14/20 0622 09/14/20 1215  GLUCAP 97 93 88 103* 106*   Lipid Profile: No results for input(s): CHOL, HDL, LDLCALC, TRIG, CHOLHDL, LDLDIRECT in the last 72 hours. Thyroid Function Tests: No results for input(s): TSH, T4TOTAL, FREET4, T3FREE, THYROIDAB in the last 72 hours. Anemia Panel: No results for input(s): VITAMINB12, FOLATE, FERRITIN, TIBC, IRON, RETICCTPCT in the last 72 hours. Sepsis Labs: No results for input(s): PROCALCITON, LATICACIDVEN in the last 168 hours.  Recent Results (from the past 240 hour(s))  C Difficile Quick Screen w PCR reflex     Status: None   Collection Time: 09/05/20  2:11 PM   Specimen: STOOL  Result Value Ref Range Status   C Diff antigen NEGATIVE NEGATIVE Final   C Diff toxin NEGATIVE NEGATIVE Final   C Diff interpretation No C. difficile detected.  Final    Comment: Performed at [facility], [place] [address] [place], [place] [address]     Radiology Studies: No results found.  Scheduled Meds: . amLODipine  5 mg Oral Daily  . aspirin EC  81 mg Oral Daily  . Chlorhexidine Gluconate Cloth  6 each Topical Daily  . docusate sodium  100 mg Oral BID  .  folic acid  1 mg Oral Daily  . heparin  5,000 Units Subcutaneous Q8H  . insulin aspart  0-9 Units Subcutaneous TID WC & HS  . metoprolol tartrate  25 mg Oral BID  . multivitamin with minerals  1 tablet Oral  Daily  . pantoprazole  40 mg Oral QHS  . polyethylene glycol  17 g Oral Daily  . thiamine  100 mg Oral Daily   Continuous Infusions:   LOS: 11 days   Marylu Lund, MD [facility] Pager On Amion  If 7PM-7AM, please contact night-coverage 09/14/2020, 4:14 PM

## 2020-09-14 NOTE — Plan of Care (Signed)

## 2020-09-15 ENCOUNTER — Inpatient Hospital Stay (HOSPITAL_COMMUNITY): Payer: Medicare HMO

## 2020-09-15 DIAGNOSIS — U071 COVID-19: Secondary | ICD-10-CM | POA: Diagnosis not present

## 2020-09-15 DIAGNOSIS — J9602 Acute respiratory failure with hypercapnia: Secondary | ICD-10-CM | POA: Diagnosis not present

## 2020-09-15 LAB — URINALYSIS, ROUTINE W REFLEX MICROSCOPIC
Bilirubin Urine: NEGATIVE
Glucose, UA: NEGATIVE mg/dL
Ketones, ur: NEGATIVE mg/dL
Nitrite: POSITIVE — AB
Protein, ur: 100 mg/dL — AB
Specific Gravity, Urine: 1.015 (ref 1.005–1.030)
pH: 5 (ref 5.0–8.0)

## 2020-09-15 LAB — GLUCOSE, CAPILLARY
Glucose-Capillary: 91 mg/dL (ref 70–99)
Glucose-Capillary: 117 mg/dL — ABNORMAL HIGH (ref 70–99)
Glucose-Capillary: 132 mg/dL — ABNORMAL HIGH (ref 70–99)
Glucose-Capillary: 84 mg/dL (ref 70–99)

## 2020-09-15 MED ORDER — SODIUM CHLORIDE 0.9 % IV SOLN
500.0000 mg | INTRAVENOUS | Status: DC
  Administered 2020-09-15 – 2020-09-16 (×2): 500 mg via INTRAVENOUS
  Filled 2020-09-15 (×3): qty 500

## 2020-09-15 MED ORDER — SODIUM CHLORIDE 0.9 % IV SOLN
2.0000 g | INTRAVENOUS | Status: AC
  Administered 2020-09-15 – 2020-09-19 (×5): 2 g via INTRAVENOUS
  Filled 2020-09-15: qty 20
  Filled 2020-09-15 (×2): qty 2
  Filled 2020-09-15: qty 20
  Filled 2020-09-15: qty 2

## 2020-09-15 MED ORDER — SODIUM CHLORIDE 0.9 % IV SOLN
1.0000 g | INTRAVENOUS | Status: DC
  Filled 2020-09-15: qty 10

## 2020-09-15 NOTE — TOC Progression Note (Addendum)
Transition of Care (TOC) - Progression Note    Patient Details  Name: Todd [name]. MRN: [id number] Date of Birth: 01/08/1956  Transition of Care [facility]) CM/SW Contact  Angelita Ingles, RN Phone Number: [phone]  09/15/2020, 1:20 PM  Clinical Narrative:    CM has made several attempts to contact patient. Bedside nurse made patient aware that CM was attempting to reach him by phone. Patient is current;ly refusing to talk on the phone. CM will continue to attempt to contact patient.   1600 CM at bedside to speak with patient about discharge disposition. Patient states that he does not want to go to rehab facility Patient verbalized understanding of rehab and the recommendation but states that he does not want to go somewhere where he cant coma and go as he pleases. Patient states that he does not want to go back to the [facility] because it is cold there and he has to sleep in his clothes. Patient states that [facility] but placement may take 1-4 months. Patient does not verbalize a realistic plan. Will make MD aware. TOC will continue to follow for other discharge needs.         Expected Discharge Plan and Services                                                 Social Determinants of Health (SDOH) Interventions    Readmission Risk Interventions No flowsheet data found.

## 2020-09-15 NOTE — Progress Notes (Signed)
OT Cancellation Note  Patient Details Name: Todd [name]. MRN: [id number] DOB: 1955/11/17   Cancelled Treatment:    Reason Eval/Treat Not Completed: Patient declined, no reason specified. Attempted x 2 but pt declining both times due to being sleepy. Unable to engage even with Rollator training (pt previously expressed interest in this). Will attempt to engage pt in OT session again as time allows.  Layla Maw 09/15/2020, 1:18 PM

## 2020-09-15 NOTE — Progress Notes (Signed)
PROGRESS NOTE    Todd [name].  FAO:[id number] DOB: Aug 07, 1956 DOA: 09/03/2020 PCP: [name], MD    Brief Narrative:  65 year old with history of DM2, HTN, CKD, alcohol/tobacco and heroin abuse presented from homeless shelter when he was found down and intubated in the ER.  There was suspicion for seizure-like activity after intubation.  Also found to have COVID-19.  CT head showed bilateral chronic infarct, moderate cerebellar disease but nothing acute.  EEG showed diffuse encephalopathy.  LP was negative for meningitis.  Initially received vancomycin, Zosyn and acyclovir which was later transitioned to Unasyn for aspiration pneumonia.  Seen by cardiology due to elevated troponin.  Also seen by GI due to severe transaminitis, currently advising to monitor.  LFTs are improving.  PT consulted.  Assessment & Plan:   Active Problems:   Acute respiratory failure with hypoxia (HCC)   Acute hypoxic respiratory failure secondary to COVID-19 infection and aspiration pneumonia -Remains on room air.  Extubated 1/20.  Continue bronchodilators -Completed course of IV Unasyn for aspiration pneumonia - Procalcitonin 3.6 on last check -Speech and swallow-on dysphagia 3 diet - Incentive spirometer, flutter valve -Currently remains on minimal O2 support -Covit test was pos on 1/19.   Unresponsive concerns of acute meningitis/encephalitis - LP negative.  Vancomycin, Zosyn and acyclovir discontinued.  CSF cultures negative. - CT head- negative for acute changes but does have chronic disease - EEG-diffuse encephalopathy, no seizures - UDS noted to be neg -Echocardiogram -echocardiogram EF 65%, grade 1 DD -mentation seems improved  Elevated Troponins -Chest pain-free.  EKG at baseline shows bundle branch block.  Echocardiogram EF 65%, grade 1 DD.  Troponins elevated but flat.  Seen by cardiology, recommendation for outpatient ischemic evaluation on d/c  AKI on CKD stage IIIa -  Admission creatinine 2.4.  Improving.  Creatinine 1.7 on 1/24.  - Cr noted to be 1.67 on last check  Transaminitis, alcohol pattern.  Improving History of hepatitis C - Hep C antibodies positive, hep C viral load- undetected - Shock liver versus ischemic hepatitis versus alcoholic hepatitis. - Seen by [facility],  -HIV noted to be negative - Abdominal ultrasound-unremarkable -LFT's recently noted to be trending down. GI has signed off  Mild rhabdomyolysis -Resolved with IV fluids  Diarrhea, nonspecific - C. difficile-negative.  Could be related to COVID-19 infection  Macrocytic anemia - Suspect from underlying alcohol use - B12, TSH-normal.   - Iron studies shows low saturation, elevated ferritin.  History of esophagitis, ulcerative - Continue PPI as toelrated  Polysubstance abuse - Will need cessation  History of DVT PE LLE, Chronic - Used to be on anticoagulation?Marland Kitchen  Ultrasound Dopplers discussed that shows chronic DVT.  Previously is completed course for Coumadin  - Has history of laser ablation of his DVT  Hx of dilated aorta Seen by CT surgery October 2019.  Discussed blood pressure control and follow-up in 2 years with CTA chest.   -Recommend f/u as outpatient on d/c   UTI with sepsis not present on admit -fever with increased lethargy with UA suggestive of UTI -Will check urine cx -Start empiric rocephin  DVT prophylaxis: Heparin subq Code Status: Full Family Communication: Pt in room, family not at bedside  Status is: Inpatient  Remains inpatient appropriate because:Unsafe d/c plan and Inpatient level of care appropriate due to severity of illness   Dispo: The patient is from: Homeless              Anticipated d/c is to: Pending TOC work  up              Anticipated d/c date is: 2 days              Patient currently is not medically stable to d/c.    Difficult to place patient Yes   Consultants:   GI  Procedures:      Antimicrobials: Anti-infectives (From admission, onward)   Start     Dose/Rate Route Frequency Ordered Stop   09/08/20 1800  Ampicillin-Sulbactam (UNASYN) 3 g in sodium chloride 0.9 % 100 mL IVPB        3 g 200 mL/hr over 30 Minutes Intravenous Every 8 hours 09/08/20 1314 09/09/20 1748   09/04/20 2000  acyclovir (ZOVIRAX) 640 mg in dextrose 5 % 100 mL IVPB  Status:  Discontinued        10 mg/kg  63.8 kg (Ideal) 112.8 mL/hr over 60 Minutes Intravenous Every 12 hours 09/04/20 0826 09/04/20 0907   09/04/20 1000  Ampicillin-Sulbactam (UNASYN) 3 g in sodium chloride 0.9 % 100 mL IVPB  Status:  Discontinued        3 g 200 mL/hr over 30 Minutes Intravenous Every 8 hours 09/04/20 0907 09/08/20 1315   09/03/20 2200  piperacillin-tazobactam (ZOSYN) IVPB 3.375 g  Status:  Discontinued        3.375 g 12.5 mL/hr over 240 Minutes Intravenous Every 8 hours 09/03/20 1858 09/03/20 1928   09/03/20 2002  vancomycin variable dose per unstable renal function (pharmacist dosing)  Status:  Discontinued         Does not apply See admin instructions 09/03/20 2002 09/04/20 0907   09/03/20 2000  acyclovir (ZOVIRAX) 1,000 mg in dextrose 5 % 150 mL IVPB  Status:  Discontinued        10 mg/kg  100 kg 170 mL/hr over 60 Minutes Intravenous Every 12 hours 09/03/20 1937 09/04/20 0826   09/03/20 1930  piperacillin-tazobactam (ZOSYN) IVPB 3.375 g  Status:  Discontinued        3.375 g 12.5 mL/hr over 240 Minutes Intravenous Every 8 hours 09/03/20 1928 09/04/20 0907   09/03/20 1815  ceFEPIme (MAXIPIME) 2 g in sodium chloride 0.9 % 100 mL IVPB  Status:  Discontinued        2 g 200 mL/hr over 30 Minutes Intravenous  Once 09/03/20 1801 09/03/20 1828   09/03/20 1815  metroNIDAZOLE (FLAGYL) IVPB 500 mg  Status:  Discontinued        500 mg 100 mL/hr over 60 Minutes Intravenous  Once 09/03/20 1801 09/03/20 1828   09/03/20 1815  vancomycin (VANCOCIN) IVPB 1000 mg/200 mL premix  Status:  Discontinued        1,000 mg 200  mL/hr over 60 Minutes Intravenous  Once 09/03/20 1801 09/03/20 1812   09/03/20 1815  vancomycin (VANCOREADY) IVPB 2000 mg/400 mL        2,000 mg 200 mL/hr over 120 Minutes Intravenous  Once 09/03/20 1812 09/03/20 2326      Subjective: Reports feeling weaker today  Objective: Vitals:   09/14/20 2041 09/15/20 0625 09/15/20 0838 09/15/20 1325  BP: 124/79 115/78 117/79 129/90  Pulse: 86 82 86 82  Resp: 19 16 20 20   Temp: 98 F (36.7 C) 98.4 F (36.9 C) 100.1 F (37.8 C) (!) 101.3 F (38.5 C)  TempSrc: Oral Oral    SpO2: 93% 92% 94% 91%  Weight:      Height:        Intake/Output Summary (Last 24 hours) at  09/15/2020 1618 Last data filed at 09/14/2020 1839 Gross per 24 hour  Intake 1200 ml  Output 500 ml  Net 700 ml   Filed Weights   09/11/20 0500 09/12/20 0500 09/13/20 0500  Weight: 77.9 kg 81.4 kg 79.1 kg    Examination: General exam: Asleep, arousable, laying in bed, in nad Respiratory system: Normal respiratory effort, no wheezing Cardiovascular system: regular rate, s1, s2 Gastrointestinal system: Soft, nondistended, positive BS Central nervous system: CN2-12 grossly intact, strength intact Extremities: Perfused, no clubbing Skin: Normal skin turgor, no notable skin lesions seen Psychiatry: Mood normal // no visual hallucinations   Data Reviewed: I have personally reviewed following labs and imaging studies  CBC: Recent Labs  Lab 09/09/20 1021 09/10/20 0411 09/11/20 0658  WBC 3.7* 4.4 4.2  HGB 13.0 12.8* 14.5  HCT 39.7 39.6 43.3  MCV 93.4 93.6 93.9  PLT 82* 95* 0000000*   Basic Metabolic Panel: Recent Labs  Lab 09/09/20 1021 09/10/20 0411 09/11/20 0658  NA  --   --  143  K  --   --  4.3  CL  --   --  113*  CO2  --   --  18*  GLUCOSE  --   --  92  BUN  --   --  19  CREATININE  --   --  1.67*  CALCIUM  --   --  9.1  MG 1.8 1.8  --    GFR: Estimated Creatinine Clearance: 44.2 mL/min (A) (by C-G formula based on SCr of 1.67 mg/dL (H)). Liver  Function Tests: Recent Labs  Lab 09/09/20 1021 09/11/20 0658  AST 195* 106*  ALT 655* 358*  ALKPHOS 56 58  BILITOT 0.6 0.7  PROT 6.8 7.8  ALBUMIN 2.7* 2.9*   No results for input(s): LIPASE, AMYLASE in the last 168 hours. No results for input(s): AMMONIA in the last 168 hours. Coagulation Profile: Recent Labs  Lab 09/09/20 1021  INR 1.1   Cardiac Enzymes: No results for input(s): CKTOTAL, CKMB, CKMBINDEX, TROPONINI in the last 168 hours. BNP (last 3 results) No results for input(s): PROBNP in the last 8760 hours. HbA1C: No results for input(s): HGBA1C in the last 72 hours. CBG: Recent Labs  Lab 09/14/20 1621 09/14/20 2038 09/15/20 0647 09/15/20 1159 09/15/20 1607  GLUCAP 99 90 91 117* 132*   Lipid Profile: No results for input(s): CHOL, HDL, LDLCALC, TRIG, CHOLHDL, LDLDIRECT in the last 72 hours. Thyroid Function Tests: No results for input(s): TSH, T4TOTAL, FREET4, T3FREE, THYROIDAB in the last 72 hours. Anemia Panel: No results for input(s): VITAMINB12, FOLATE, FERRITIN, TIBC, IRON, RETICCTPCT in the last 72 hours. Sepsis Labs: No results for input(s): PROCALCITON, LATICACIDVEN in the last 168 hours.  No results found for this or any previous visit (from the past 240 hour(s)).   Radiology Studies: DG Chest 1 View  Result Date: 09/15/2020 CLINICAL DATA:  Fever. EXAM: CHEST  1 VIEW COMPARISON:  09/04/2020 FINDINGS: Bilateral patchy and nodular airspace disease is evident. No substantial pleural effusion. Cardiopericardial silhouette is at upper limits of normal for size. The visualized bony structures of the thorax show no acute abnormality. IMPRESSION: Bilateral patchy and nodular airspace disease compatible with multifocal pneumonia. Electronically Signed   By: Misty Stanley M.D.   On: 09/15/2020 14:19    Scheduled Meds: . amLODipine  5 mg Oral Daily  . aspirin EC  81 mg Oral Daily  . Chlorhexidine Gluconate Cloth  6 each Topical Daily  . docusate sodium  100 mg Oral BID  . folic acid  1 mg Oral Daily  . heparin  5,000 Units Subcutaneous Q8H  . insulin aspart  0-9 Units Subcutaneous TID WC & HS  . metoprolol tartrate  25 mg Oral BID  . multivitamin with minerals  1 tablet Oral Daily  . pantoprazole  40 mg Oral QHS  . polyethylene glycol  17 g Oral Daily  . thiamine  100 mg Oral Daily   Continuous Infusions:   LOS: 12 days   Marylu Lund, MD [facility] Pager On Amion  If 7PM-7AM, please contact night-coverage 09/15/2020, 4:18 PM

## 2020-09-16 DIAGNOSIS — U071 COVID-19: Secondary | ICD-10-CM | POA: Diagnosis not present

## 2020-09-16 DIAGNOSIS — J9602 Acute respiratory failure with hypercapnia: Secondary | ICD-10-CM | POA: Diagnosis not present

## 2020-09-16 LAB — CBC
HCT: 37.6 % — ABNORMAL LOW (ref 39.0–52.0)
Hemoglobin: 12.7 g/dL — ABNORMAL LOW (ref 13.0–17.0)
MCH: 31.9 pg (ref 26.0–34.0)
MCHC: 33.8 g/dL (ref 30.0–36.0)
MCV: 94.5 fL (ref 80.0–100.0)
Platelets: 303 10*3/uL (ref 150–400)
RBC: 3.98 MIL/uL — ABNORMAL LOW (ref 4.22–5.81)
RDW: 14.8 % (ref 11.5–15.5)
WBC: 4.8 10*3/uL (ref 4.0–10.5)
nRBC: 0 % (ref 0.0–0.2)

## 2020-09-16 LAB — COMPREHENSIVE METABOLIC PANEL
ALT: 107 U/L — ABNORMAL HIGH (ref 0–44)
AST: 75 U/L — ABNORMAL HIGH (ref 15–41)
Albumin: 2.4 g/dL — ABNORMAL LOW (ref 3.5–5.0)
Alkaline Phosphatase: 45 U/L (ref 38–126)
Anion gap: 14 (ref 5–15)
BUN: 19 mg/dL (ref 8–23)
CO2: 17 mmol/L — ABNORMAL LOW (ref 22–32)
Calcium: 8.8 mg/dL — ABNORMAL LOW (ref 8.9–10.3)
Chloride: 109 mmol/L (ref 98–111)
Creatinine, Ser: 1.72 mg/dL — ABNORMAL HIGH (ref 0.61–1.24)
GFR, Estimated: 44 mL/min — ABNORMAL LOW (ref >60)
Glucose, Bld: 96 mg/dL (ref 70–99)
Potassium: 4.1 mmol/L (ref 3.5–5.1)
Sodium: 140 mmol/L (ref 135–145)
Total Bilirubin: 0.7 mg/dL (ref 0.3–1.2)
Total Protein: 7.4 g/dL (ref 6.5–8.1)

## 2020-09-16 LAB — GLUCOSE, CAPILLARY
Glucose-Capillary: 96 mg/dL (ref 70–99)
Glucose-Capillary: 88 mg/dL (ref 70–99)
Glucose-Capillary: 108 mg/dL — ABNORMAL HIGH (ref 70–99)
Glucose-Capillary: 92 mg/dL (ref 70–99)

## 2020-09-16 NOTE — Progress Notes (Signed)
Physical Therapy Treatment Patient Details Name: Todd [name]. MRN: [id number] DOB: [birth year] Today's Date: 09/16/2020    History of Present Illness 65 year old with history of DM2, HTN, CKD, alcohol/tobacco and heroin abuse, Hepatitis C presented 09/03/20 from homeless shelter when he was found down. Intubated 1/19-1/20.  There was suspicion for seizure-like activity after intubation. UDS negative.   CT head showed bilateral chronic infarct, moderate cerebellar disease but nothing acute.  EEG showed diffuse encephalopathy.  LP was negative for meningitis; Also found to have COVID-19.+aspiration pneumonia;    PT Comments    Patient continues to have quick swings in his demeanor (agitated to pleasant). Ultimately finally agreed to get up and practice with rollator to allow PT to assess his safety with this device. Based on the limited amount he did (30 ft; refused to practice sitting down onto rollator seat), he was safe with rollator.     Follow Up Recommendations  SNF     Equipment Recommendations  Other (comment) (rollator)    Recommendations for Other Services       Precautions / Restrictions Precautions Precautions: Fall    Mobility  Bed Mobility Overal bed mobility: Needs Assistance Bed Mobility: Supine to Sit;Sit to Supine     Supine to sit: Supervision Sit to supine: Supervision   General bed mobility comments: bed flat; no physical assist needed, however got upset with PT for not helping him "You suck"  Transfers Overall transfer level: Needs assistance Equipment used: 4-wheeled walker Transfers: Sit to/from Stand Sit to Stand: Min guard         General transfer comment: demonstrated to pt correct use during transfer from bed; pt return demonstratedwith proper technique;refused transfer to sitting on the seatof the rollator.  Ambulation/Gait Ambulation/Gait assistance: Min Web designer (Feet): 30 Feet Assistive device: 4-wheeled walker Gait  Pattern/deviations: Step-through pattern;Decreased stride length;Trunk flexed     General Gait Details: used rollator appropriately, including 180 degree turn   Stairs             Wheelchair Mobility    Modified Rankin (Stroke Patients Only)       Balance Overall balance assessment: Needs assistance Sitting-balance support: No upper extremity supported;Feet supported Sitting balance-Leahy Scale: Fair     Standing balance support: Single extremity supported;Bilateral upper extremity supported Standing balance-Leahy Scale: Poor Standing balance comment: reliant on at least one UE support                            Cognition Arousal/Alertness: Awake/alert Behavior During Therapy: Flat affect;Agitated Overall Cognitive Status: No family/caregiver present to determine baseline cognitive functioning Area of Impairment: Safety/judgement;Following commands;Problem solving;Awareness                 Orientation Level:  (NT) Current Attention Level: Sustained   Following Commands: Follows one step commands with increased time Safety/Judgement: Decreased awareness of deficits;Decreased awareness of safety Awareness: Emergent Problem Solving: Slow processing General Comments: Doesn't like too much talking. Required incr time for all decisions re: cooperating      Exercises General Exercises - Lower Extremity Toe Raises: AROM;Both;10 reps Heel Raises: AROM;Both;10 reps    General Comments General comments (skin integrity, edema, etc.): Patient desperately wants to get a rollator. He moves at his own pace (very slow) and likely uses it as a control tactic since so much is out of his control here in the hospital. While he was "waiting for the room to warm up"  I demonstrated and explained safe use of rollator and pro's/con's of rollator compared to RW.      Pertinent Vitals/Pain Pain Assessment: Faces Faces Pain Scale: Hurts little more Pain Location:  abdomen Pain Descriptors / Indicators: Cramping Pain Intervention(s): Limited activity within patient's tolerance;Monitored during session    Home Living                      Prior Function            PT Goals (current goals can now be found in the care plan section) Acute Rehab PT Goals Patient Stated Goal: to get a rollator Time For Goal Achievement: 09/21/20 Potential to Achieve Goals: Fair Progress towards PT goals: Progressing toward goals    Frequency    Min 2X/week      PT Plan Current plan remains appropriate    Co-evaluation              AM-PAC PT "6 Clicks" Mobility   Outcome Measure  Help needed turning from your back to your side while in a flat bed without using bedrails?: None Help needed moving from lying on your back to sitting on the side of a flat bed without using bedrails?: A Little Help needed moving to and from a bed to a chair (including a wheelchair)?: A Little Help needed standing up from a chair using your arms (e.g., wheelchair or bedside chair)?: A Little Help needed to walk in hospital room?: A Little Help needed climbing 3-5 steps with a railing? : Total 6 Click Score: 17    End of Session   Activity Tolerance: Treatment limited secondary to agitation Patient left: in bed;with call bell/phone within reach;with bed alarm set Nurse Communication: Mobility status PT Visit Diagnosis: Muscle weakness (generalized) (M62.81);Other abnormalities of gait and mobility (R26.89);Pain Pain - Right/Left:  (midline) Pain - part of body:  (abdomen)     Time: 1524-1600 PT Time Calculation (min) (ACUTE ONLY): 36 min  Charges:  $Gait Training: 23-37 mins                      Arby Barrette, PT Pager [phone]    [name] 09/16/2020, 5:06 PM

## 2020-09-16 NOTE — Plan of Care (Signed)

## 2020-09-16 NOTE — Progress Notes (Signed)
PROGRESS NOTE    Todd [name].  BSW:[id number] DOB: 1955/12/05 DOA: 09/03/2020 PCP: [name], MD    Brief Narrative:  65 year old with history of DM2, HTN, CKD, alcohol/tobacco and heroin abuse presented from homeless shelter when he was found down and intubated in the ER.  There was suspicion for seizure-like activity after intubation.  Also found to have COVID-19.  CT head showed bilateral chronic infarct, moderate cerebellar disease but nothing acute.  EEG showed diffuse encephalopathy.  LP was negative for meningitis.  Initially received vancomycin, Zosyn and acyclovir which was later transitioned to Unasyn for aspiration pneumonia.  Seen by cardiology due to elevated troponin.  Also seen by GI due to severe transaminitis, currently advising to monitor.  LFTs are improving.  PT consulted.  Assessment & Plan:   Active Problems:   Acute respiratory failure with hypoxia (HCC)   Acute hypoxic respiratory failure secondary to COVID-19 infection and aspiration pneumonia -Remains on room air.  Extubated 1/20.  Continue bronchodilators -Completed course of IV Unasyn for aspiration pneumonia - Procalcitonin 3.6 on last check -Speech and swallow-on dysphagia 3 diet - Incentive spirometer, flutter valve -Currently remains on minimal O2 support -Covid test was pos on 1/19.  -Recent fevers over weekend with CXR demonstrating bilateral patchy infiltrates. Now on azithromycin and rocephin  Unresponsive concerns of acute meningitis/encephalitis - LP negative.  Vancomycin, Zosyn and acyclovir discontinued.  CSF cultures negative. - CT head- negative for acute changes but does have chronic disease - EEG-diffuse encephalopathy, no seizures - UDS noted to be neg -Echocardiogram -echocardiogram EF 65%, grade 1 DD -mentation now seems improved  Elevated Troponins -Chest pain-free.  EKG at baseline shows bundle branch block.  Echocardiogram EF 65%, grade 1 DD.  Troponins elevated but  flat.  Seen by cardiology, recommendation for outpatient ischemic evaluation on d/c  AKI on CKD stage IIIa - Admission creatinine 2.4.  Improving.  Creatinine 1.7 on 1/24.  - Cr today 1.72 -repeat bmet in AM  Transaminitis, alcohol pattern.  Improving History of hepatitis C - Hep C antibodies positive, hep C viral load- undetected - Shock liver versus ischemic hepatitis versus alcoholic hepatitis. - Seen by [facility],  -HIV noted to be negative - Abdominal ultrasound-unremarkable -LFT's recently noted to be trending down. GI has since signed off  Mild rhabdomyolysis -Resolved with IV fluids  Diarrhea, nonspecific - C. difficile-negative.  Could be related to COVID-19 infection  Macrocytic anemia - Suspect from underlying alcohol use - B12, TSH-normal.   - Iron studies shows low saturation, elevated ferritin.  History of esophagitis, ulcerative - Continue PPI as toelrated  Polysubstance abuse - Will need cessation  History of DVT PE LLE, Chronic -  Ultrasound Dopplers discussed that shows chronic DVT.  Previously reportedly had completed course for Coumadin  - Has history of laser ablation of his DVT  Hx of dilated aorta Seen by CT surgery October 2019.  Discussed blood pressure control and follow-up in 2 years with CTA chest.   -Recommend f/u as outpatient on d/c   UTI with sepsis not present on admit -recent fever with increased lethargy with UA suggestive of UTI. Pt does complain of dysuria -Blood cx neg thus far. Urine cx pending -On empiric rocephin  DVT prophylaxis: Heparin subq Code Status: Full Family Communication: Pt in room, family not at bedside  Status is: Inpatient  Remains inpatient appropriate because:Unsafe d/c plan and Inpatient level of care appropriate due to severity of illness   Dispo: The patient is  from: Homeless              Anticipated d/c is to: Pending TOC work up              Anticipated d/c date is: 2 days               Patient currently is not medically stable to d/c.    Difficult to place patient Yes   Consultants:   GI  Procedures:     Antimicrobials: Anti-infectives (From admission, onward)   Start     Dose/Rate Route Frequency Ordered Stop   09/15/20 1800  azithromycin (ZITHROMAX) 500 mg in sodium chloride 0.9 % 250 mL IVPB        500 mg 250 mL/hr over 60 Minutes Intravenous Every 24 hours 09/15/20 1642 09/20/20 1759   09/15/20 1730  cefTRIAXone (ROCEPHIN) 2 g in sodium chloride 0.9 % 100 mL IVPB        2 g 200 mL/hr over 30 Minutes Intravenous Every 24 hours 09/15/20 1642 09/20/20 1729   09/15/20 1715  cefTRIAXone (ROCEPHIN) 1 g in sodium chloride 0.9 % 100 mL IVPB  Status:  Discontinued        1 g 200 mL/hr over 30 Minutes Intravenous Every 24 hours 09/15/20 1625 09/15/20 1642   09/08/20 1800  Ampicillin-Sulbactam (UNASYN) 3 g in sodium chloride 0.9 % 100 mL IVPB        3 g 200 mL/hr over 30 Minutes Intravenous Every 8 hours 09/08/20 1314 09/09/20 1748   09/04/20 2000  acyclovir (ZOVIRAX) 640 mg in dextrose 5 % 100 mL IVPB  Status:  Discontinued        10 mg/kg  63.8 kg (Ideal) 112.8 mL/hr over 60 Minutes Intravenous Every 12 hours 09/04/20 0826 09/04/20 0907   09/04/20 1000  Ampicillin-Sulbactam (UNASYN) 3 g in sodium chloride 0.9 % 100 mL IVPB  Status:  Discontinued        3 g 200 mL/hr over 30 Minutes Intravenous Every 8 hours 09/04/20 0907 09/08/20 1315   09/03/20 2200  piperacillin-tazobactam (ZOSYN) IVPB 3.375 g  Status:  Discontinued        3.375 g 12.5 mL/hr over 240 Minutes Intravenous Every 8 hours 09/03/20 1858 09/03/20 1928   09/03/20 2002  vancomycin variable dose per unstable renal function (pharmacist dosing)  Status:  Discontinued         Does not apply See admin instructions 09/03/20 2002 09/04/20 0907   09/03/20 2000  acyclovir (ZOVIRAX) 1,000 mg in dextrose 5 % 150 mL IVPB  Status:  Discontinued        10 mg/kg  100 kg 170 mL/hr over 60 Minutes Intravenous Every 12  hours 09/03/20 1937 09/04/20 0826   09/03/20 1930  piperacillin-tazobactam (ZOSYN) IVPB 3.375 g  Status:  Discontinued        3.375 g 12.5 mL/hr over 240 Minutes Intravenous Every 8 hours 09/03/20 1928 09/04/20 0907   09/03/20 1815  ceFEPIme (MAXIPIME) 2 g in sodium chloride 0.9 % 100 mL IVPB  Status:  Discontinued        2 g 200 mL/hr over 30 Minutes Intravenous  Once 09/03/20 1801 09/03/20 1828   09/03/20 1815  metroNIDAZOLE (FLAGYL) IVPB 500 mg  Status:  Discontinued        500 mg 100 mL/hr over 60 Minutes Intravenous  Once 09/03/20 1801 09/03/20 1828   09/03/20 1815  vancomycin (VANCOCIN) IVPB 1000 mg/200 mL premix  Status:  Discontinued  1,000 mg 200 mL/hr over 60 Minutes Intravenous  Once 09/03/20 1801 09/03/20 1812   09/03/20 1815  vancomycin (VANCOREADY) IVPB 2000 mg/400 mL        2,000 mg 200 mL/hr over 120 Minutes Intravenous  Once 09/03/20 1812 09/03/20 2326      Subjective: States feeling somewhat better today  Objective: Vitals:   09/15/20 1900 09/15/20 2053 09/15/20 2300 09/16/20 0323  BP:  105/70  123/79  Pulse:  84  81  Resp:  19  19  Temp: 99.1 F (37.3 C) (!) 101 F (38.3 C) 98.2 F (36.8 C) 98.7 F (37.1 C)  TempSrc: Oral Oral Oral Oral  SpO2:  94%  95%  Weight:      Height:        Intake/Output Summary (Last 24 hours) at 09/16/2020 1612 Last data filed at 09/16/2020 1100 Gross per 24 hour  Intake 930.13 ml  Output 1300 ml  Net -369.87 ml   Filed Weights   09/11/20 0500 09/12/20 0500 09/13/20 0500  Weight: 77.9 kg 81.4 kg 79.1 kg    Examination: General exam: Asleep, arousable, laying in bed, in nad Respiratory system: Normal respiratory effort, no wheezing Cardiovascular system: regular rate, s1, s2 Gastrointestinal system: Soft, nondistended, positive BS Central nervous system: CN2-12 grossly intact, strength intact Extremities: Perfused, no clubbing Skin: Normal skin turgor, no notable skin lesions seen Psychiatry: Mood normal // no  visual hallucinations   Data Reviewed: I have personally reviewed following labs and imaging studies  CBC: Recent Labs  Lab 09/10/20 0411 09/11/20 0658 09/16/20 0244  WBC 4.4 4.2 4.8  HGB 12.8* 14.5 12.7*  HCT 39.6 43.3 37.6*  MCV 93.6 93.9 94.5  PLT 95* 137* [id number]   Basic Metabolic Panel: Recent Labs  Lab 09/10/20 0411 09/11/20 0658 09/16/20 0244  NA  --  143 140  K  --  4.3 4.1  CL  --  113* 109  CO2  --  18* 17*  GLUCOSE  --  92 96  BUN  --  19 19  CREATININE  --  1.67* 1.72*  CALCIUM  --  9.1 8.8*  MG 1.8  --   --    GFR: Estimated Creatinine Clearance: 42.9 mL/min (A) (by C-G formula based on SCr of 1.72 mg/dL (H)). Liver Function Tests: Recent Labs  Lab 09/11/20 0658 09/16/20 0244  AST 106* 75*  ALT 358* 107*  ALKPHOS 58 45  BILITOT 0.7 0.7  PROT 7.8 7.4  ALBUMIN 2.9* 2.4*   No results for input(s): LIPASE, AMYLASE in the last 168 hours. No results for input(s): AMMONIA in the last 168 hours. Coagulation Profile: No results for input(s): INR, PROTIME in the last 168 hours. Cardiac Enzymes: No results for input(s): CKTOTAL, CKMB, CKMBINDEX, TROPONINI in the last 168 hours. BNP (last 3 results) No results for input(s): PROBNP in the last 8760 hours. HbA1C: No results for input(s): HGBA1C in the last 72 hours. CBG: Recent Labs  Lab 09/15/20 1159 09/15/20 1607 09/15/20 2051 09/16/20 0647 09/16/20 1229  GLUCAP 117* 132* 84 96 88   Lipid Profile: No results for input(s): CHOL, HDL, LDLCALC, TRIG, CHOLHDL, LDLDIRECT in the last 72 hours. Thyroid Function Tests: No results for input(s): TSH, T4TOTAL, FREET4, T3FREE, THYROIDAB in the last 72 hours. Anemia Panel: No results for input(s): VITAMINB12, FOLATE, FERRITIN, TIBC, IRON, RETICCTPCT in the last 72 hours. Sepsis Labs: No results for input(s): PROCALCITON, LATICACIDVEN in the last 168 hours.  Recent Results (from the past 240  hour(s))  Culture, blood (routine x 2)     Status: None  (Preliminary result)   Collection Time: 09/15/20  2:26 PM   Specimen: BLOOD  Result Value Ref Range Status   Specimen Description BLOOD RIGHT ANTECUBITAL  Final   Special Requests   Final    BOTTLES DRAWN AEROBIC ONLY Blood Culture adequate volume   Culture   Final    NO GROWTH 1 DAY Performed at [facility], [address], [place], [place] [address]    Report Status PENDING  Incomplete  Culture, blood (routine x 2)     Status: None (Preliminary result)   Collection Time: 09/15/20  2:56 PM   Specimen: BLOOD  Result Value Ref Range Status   Specimen Description BLOOD RIGHT ANTECUBITAL  Final   Special Requests   Final    BOTTLES DRAWN AEROBIC ONLY Blood Culture adequate volume   Culture   Final    NO GROWTH 1 DAY Performed at [facility], [place] [address] [place], [place] [address]    Report Status PENDING  Incomplete     Radiology Studies: DG Chest 1 View  Result Date: 09/15/2020 CLINICAL DATA:  Fever. EXAM: CHEST  1 VIEW COMPARISON:  09/04/2020 FINDINGS: Bilateral patchy and nodular airspace disease is evident. No substantial pleural effusion. Cardiopericardial silhouette is at upper limits of normal for size. The visualized bony structures of the thorax show no acute abnormality. IMPRESSION: Bilateral patchy and nodular airspace disease compatible with multifocal pneumonia. Electronically Signed   By: Misty Stanley M.D.   On: 09/15/2020 14:19    Scheduled Meds: . amLODipine  5 mg Oral Daily  . aspirin EC  81 mg Oral Daily  . Chlorhexidine Gluconate Cloth  6 each Topical Daily  . docusate sodium  100 mg Oral BID  . folic acid  1 mg Oral Daily  . heparin  5,000 Units Subcutaneous Q8H  . insulin aspart  0-9 Units Subcutaneous TID WC & HS  . metoprolol tartrate  25 mg Oral BID  . multivitamin with minerals  1 tablet Oral Daily  . pantoprazole  40 mg Oral QHS  . polyethylene glycol  17 g Oral Daily  . thiamine  100 mg Oral Daily   Continuous Infusions: .  azithromycin 500 mg (09/15/20 1826)  . cefTRIAXone (ROCEPHIN)  IV 2 g (09/15/20 1753)     LOS: 13 days   Marylu Lund, MD [facility] Pager On Amion  If 7PM-7AM, please contact night-coverage 09/16/2020, 4:12 PM

## 2020-09-17 DIAGNOSIS — J9602 Acute respiratory failure with hypercapnia: Secondary | ICD-10-CM | POA: Diagnosis not present

## 2020-09-17 DIAGNOSIS — J9601 Acute respiratory failure with hypoxia: Secondary | ICD-10-CM | POA: Diagnosis not present

## 2020-09-17 LAB — COMPREHENSIVE METABOLIC PANEL
ALT: 80 U/L — ABNORMAL HIGH (ref 0–44)
AST: 49 U/L — ABNORMAL HIGH (ref 15–41)
Albumin: 2.3 g/dL — ABNORMAL LOW (ref 3.5–5.0)
Alkaline Phosphatase: 50 U/L (ref 38–126)
Anion gap: 13 (ref 5–15)
BUN: 15 mg/dL (ref 8–23)
CO2: 17 mmol/L — ABNORMAL LOW (ref 22–32)
Calcium: 8.9 mg/dL (ref 8.9–10.3)
Chloride: 110 mmol/L (ref 98–111)
Creatinine, Ser: 1.62 mg/dL — ABNORMAL HIGH (ref 0.61–1.24)
GFR, Estimated: 47 mL/min — ABNORMAL LOW (ref >60)
Glucose, Bld: 120 mg/dL — ABNORMAL HIGH (ref 70–99)
Potassium: 3.9 mmol/L (ref 3.5–5.1)
Sodium: 140 mmol/L (ref 135–145)
Total Bilirubin: 0.6 mg/dL (ref 0.3–1.2)
Total Protein: 7.4 g/dL (ref 6.5–8.1)

## 2020-09-17 LAB — GLUCOSE, CAPILLARY
Glucose-Capillary: 92 mg/dL (ref 70–99)
Glucose-Capillary: 127 mg/dL — ABNORMAL HIGH (ref 70–99)
Glucose-Capillary: 87 mg/dL (ref 70–99)
Glucose-Capillary: 108 mg/dL — ABNORMAL HIGH (ref 70–99)

## 2020-09-17 LAB — CBC
HCT: 35.2 % — ABNORMAL LOW (ref 39.0–52.0)
Hemoglobin: 12.1 g/dL — ABNORMAL LOW (ref 13.0–17.0)
MCH: 33.2 pg (ref 26.0–34.0)
MCHC: 34.4 g/dL (ref 30.0–36.0)
MCV: 96.7 fL (ref 80.0–100.0)
Platelets: 335 10*3/uL (ref 150–400)
RBC: 3.64 MIL/uL — ABNORMAL LOW (ref 4.22–5.81)
RDW: 14.7 % (ref 11.5–15.5)
WBC: 4.6 10*3/uL (ref 4.0–10.5)
nRBC: 0 % (ref 0.0–0.2)

## 2020-09-17 MED ORDER — AZITHROMYCIN 250 MG PO TABS
500.0000 mg | ORAL_TABLET | Freq: Every day | ORAL | Status: AC
  Administered 2020-09-17 – 2020-09-19 (×3): 500 mg via ORAL
  Filled 2020-09-17 (×3): qty 2

## 2020-09-17 NOTE — TOC Progression Note (Signed)
Transition of Care (TOC) - Progression Note    Patient Details  Name: Trini Christiansen. MRN: [id number] Date of Birth: [birth year]  [facility] CM/SW Contact  Angelita Ingles, RN Phone Number: [phone]  09/17/2020, 12:07 PM  Clinical Narrative:    CM received message from the nurse stating that patient now wishes to return to the [facility] and the patient states that he is able to take care of himself. CM attempted to contact [place] at the [employer]. Message has been left. Will await return call.    Expected Discharge Plan: Homeless Shelter Barriers to Discharge: Active Substance Use - Placement,ED Unsafe disposition,Homeless with medical needs  Expected Discharge Plan and Services Expected Discharge Plan: Homeless Shelter In-house Referral: NA Discharge Planning Services: CM Consult   Living arrangements for the past 2 months: Homeless                 DME Arranged: N/A                     Social Determinants of Health (SDOH) Interventions    Readmission Risk Interventions Readmission Risk Prevention Plan 09/16/2020  Transportation Screening Complete  PCP or Specialist Appt within 5-7 Days Complete  Home Care Screening Complete  Medication Review (RN CM) Referral to Pharmacy  Some recent data might be hidden

## 2020-09-17 NOTE — Plan of Care (Signed)

## 2020-09-17 NOTE — Progress Notes (Signed)
PROGRESS NOTE  Todd [name]. OU:[id number] DOB: 13-Nov-1955 DOA: 09/03/2020 PCP: [name], MD   LOS: 14 days   Brief Narrative / Interim history: 65 year old with history of DM2, HTN, CKD, alcohol/tobacco and heroin abuse presented from homeless shelter when he was found down and intubated in the ER. There was suspicion for seizure-like activity after intubation. Also found to have COVID-19. CT head showed bilateral chronic infarct, moderate cerebellar disease but nothing acute. EEG showed diffuse encephalopathy. LP was negative for meningitis. Initially received vancomycin, Zosyn and acyclovir which was later transitioned to Unasyn for aspiration pneumonia. Seen by cardiology due to elevated troponin. Also seen by GI due to severe transaminitis, currently advising to monitor. LFTs are improving. PT consulted.  Subjective / 24h Interval events: He is doing well this morning, denies any shortness of breath, no chest pain, no abdominal pain, no nausea or vomiting  Assessment & Plan: Principal Problem Acute hypoxic respiratory failure due to COVID-19 infection, aspiration pneumonia -Initially admitted to the ICU and was intubated.  Successfully extubated on 1/20, currently on room air.  Due to bilateral patchy infiltrates and recent fevers over the past weekend, elevated procalcitonin he is on ceftriaxone and azithromycin.  Plan for total of 5 days  Active Problems Acute metabolic encephalopathy, unresponsive episode-there were initial concerns for acute meningitis/encephalitis.  He was initially on broad-spectrum antibiotics with vancomycin, Zosyn, acyclovir, underwent an LP which was negative.  Antibiotics were discontinued.  He underwent an EEG which showed diffuse encephalopathy without seizures, and UDS was negative.  Mental status appears to be at baseline currently  Elevated troponin-2D echo showed EF 123456, grade 1 diastolic dysfunction.  Cardiology saw him and recommended  for outpatient ischemic evaluation on discharge  Acute kidney injury on chronic kidney disease stage IIIa-creatinine on admission improving, currently at 1.6.  Transaminitis, alcohol pattern, history of hep C-significant elevated liver enzymes on admission, concern for shock liver versus ischemic hepatitis versus metabolic hepatitis.  GI consulted and evaluated patient.  LFTs improving  Mild rhabdomyolysis-resolved with IV fluids  Diarrhea-C. difficile negative, possibly due to COVID-19  Microcytic anemia-suspect from underlying alcohol use.  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  He was on Coumadin in the past  History of dilated aorta-outpatient follow-up with cardiothoracic surgery  UTI with sepsis-on Ceftriaxone   Scheduled Meds: . amLODipine  5 mg Oral Daily  . aspirin EC  81 mg Oral Daily  . azithromycin  500 mg Oral q1800  . Chlorhexidine Gluconate Cloth  6 each Topical Daily  . docusate sodium  100 mg Oral BID  . folic acid  1 mg Oral Daily  . heparin  5,000 Units Subcutaneous Q8H  . insulin aspart  0-9 Units Subcutaneous TID WC & HS  . metoprolol tartrate  25 mg Oral BID  . multivitamin with minerals  1 tablet Oral Daily  . pantoprazole  40 mg Oral QHS  . polyethylene glycol  17 g Oral Daily  . thiamine  100 mg Oral Daily   Continuous Infusions: . cefTRIAXone (ROCEPHIN)  IV Stopped (09/16/20 1835)   PRN Meds:.acetaminophen, albuterol, dextromethorphan-guaiFENesin, ondansetron (ZOFRAN) IV, polyethylene glycol  Diet Orders (From admission, onward)    Start     Ordered   09/05/20 1413  DIET DYS 3 Room service appropriate? Yes; Fluid consistency: Thin  Diet effective now       Question Answer Comment  Room service appropriate? Yes  Fluid consistency: Thin      09/05/20 1412          DVT prophylaxis: heparin injection 5,000 Units Start: 09/03/20 2200 SCDs  Start: 09/03/20 1821     Code Status: Full Code  Family Communication: no family at bedside   Status is: Inpatient  Remains inpatient appropriate because:Inpatient level of care appropriate due to severity of illness   Dispo: The patient is from: Home              Anticipated d/c is to: SNF              Anticipated d/c date is: 2 days              Patient currently is not medically stable to d/c.   Difficult to place patient No  Level of care: Med-Surg  Consultants:  none  Procedures:  None   Microbiology  None   Antimicrobials: Ceftriaxone / Azithromycin    Objective: Vitals:   09/15/20 2300 09/16/20 0323 09/16/20 2003 09/17/20 0301  BP:  123/79 122/80 116/78  Pulse:  81 85 82  Resp:  19 18 18   Temp: 98.2 F (36.8 C) 98.7 F (37.1 C) 98 F (36.7 C) 98 F (36.7 C)  TempSrc: Oral Oral  Axillary  SpO2:  95% 91% 92%  Weight:      Height:        Intake/Output Summary (Last 24 hours) at 09/17/2020 1346 Last data filed at 09/17/2020 1145 Gross per 24 hour  Intake 227.52 ml  Output 1000 ml  Net -772.48 ml   Filed Weights   09/11/20 0500 09/12/20 0500 09/13/20 0500  Weight: 77.9 kg 81.4 kg 79.1 kg    Examination:  Constitutional: NAD Eyes: no scleral icterus ENMT: Mucous membranes are moist.  Neck: normal, supple Respiratory: clear to auscultation bilaterally, no wheezing, no crackles. Normal respiratory effort. No accessory muscle use.  Cardiovascular: Regular rate and rhythm, no murmurs / rubs / gallops. No LE edema.  Abdomen: non distended, no tenderness. Bowel sounds positive.  Musculoskeletal: no clubbing / cyanosis.  Skin: no rashes Neurologic: CN 2-12 grossly intact. Strength 5/5 in all 4.   Data Reviewed: I have independently reviewed following labs and imaging studies   CBC: Recent Labs  Lab 09/11/20 0658 09/16/20 0244 09/17/20 0420  WBC 4.2 4.8 4.6  HGB 14.5 12.7* 12.1*  HCT 43.3 37.6* 35.2*  MCV 93.9 94.5 96.7  PLT 137* 303 756    Basic Metabolic Panel: Recent Labs  Lab 09/11/20 0658 09/16/20 0244 09/17/20 0420  NA 143 140 140  K 4.3 4.1 3.9  CL 113* 109 110  CO2 18* 17* 17*  GLUCOSE 92 96 120*  BUN 19 19 15   CREATININE 1.67* 1.72* 1.62*  CALCIUM 9.1 8.8* 8.9   Liver Function Tests: Recent Labs  Lab 09/11/20 0658 09/16/20 0244 09/17/20 0420  AST 106* 75* 49*  ALT 358* 107* 80*  ALKPHOS 58 45 50  BILITOT 0.7 0.7 0.6  PROT 7.8 7.4 7.4  ALBUMIN 2.9* 2.4* 2.3*   Coagulation Profile: No results for input(s): INR, PROTIME in the last 168 hours. HbA1C: No results for input(s): HGBA1C in the last 72 hours. CBG: Recent Labs  Lab 09/16/20 1229 09/16/20 1800 09/16/20 2000 09/17/20 0805 09/17/20 1205  GLUCAP 88 108* 92 92 127*    Recent Results (from the past 240 hour(s))  Culture, blood (routine x 2)     Status: None (Preliminary result)   Collection Time:  09/15/20  2:26 PM   Specimen: BLOOD  Result Value Ref Range Status   Specimen Description BLOOD RIGHT ANTECUBITAL  Final   Special Requests   Final    BOTTLES DRAWN AEROBIC ONLY Blood Culture adequate volume   Culture   Final    NO GROWTH 2 DAYS Performed at [facility], [address] [place], [place] [address]    Report Status PENDING  Incomplete  Culture, blood (routine x 2)     Status: None (Preliminary result)   Collection Time: 09/15/20  2:56 PM   Specimen: BLOOD  Result Value Ref Range Status   Specimen Description BLOOD RIGHT ANTECUBITAL  Final   Special Requests   Final    BOTTLES DRAWN AEROBIC ONLY Blood Culture adequate volume   Culture   Final    NO GROWTH 2 DAYS Performed at [facility], [place] [address], [place], [place] [address]    Report Status PENDING  Incomplete  Culture, Urine     Status: Abnormal (Preliminary result)   Collection Time: 09/15/20  4:25 PM   Specimen: Urine, Random  Result Value Ref Range Status   Specimen Description URINE, RANDOM  Final   Special Requests NONE  Final    Culture (A)  Final    >=100,000 COLONIES/mL ESCHERICHIA COLI SUSCEPTIBILITIES TO FOLLOW Performed at [facility], [place] [address] [place], [place] [address]    Report Status PENDING  Incomplete     Radiology Studies: No results found.   Marzetta Board, MD, PhD [facility]  Between 7 am - 7 pm I am available, please contact me via Amion or Securechat  Between 7 pm - 7 am I am not available, please contact night coverage MD/APP via Amion

## 2020-09-18 ENCOUNTER — Inpatient Hospital Stay (HOSPITAL_COMMUNITY): Payer: Medicare HMO

## 2020-09-18 DIAGNOSIS — J9601 Acute respiratory failure with hypoxia: Secondary | ICD-10-CM | POA: Diagnosis not present

## 2020-09-18 DIAGNOSIS — J9602 Acute respiratory failure with hypercapnia: Secondary | ICD-10-CM | POA: Diagnosis not present

## 2020-09-18 LAB — BASIC METABOLIC PANEL
Anion gap: 9 (ref 5–15)
BUN: 10 mg/dL (ref 8–23)
CO2: 20 mmol/L — ABNORMAL LOW (ref 22–32)
Calcium: 9.1 mg/dL (ref 8.9–10.3)
Chloride: 110 mmol/L (ref 98–111)
Creatinine, Ser: 1.3 mg/dL — ABNORMAL HIGH (ref 0.61–1.24)
GFR, Estimated: >60 mL/min (ref >60)
Glucose, Bld: 111 mg/dL — ABNORMAL HIGH (ref 70–99)
Potassium: 4 mmol/L (ref 3.5–5.1)
Sodium: 139 mmol/L (ref 135–145)

## 2020-09-18 LAB — CBC
HCT: 37.5 % — ABNORMAL LOW (ref 39.0–52.0)
Hemoglobin: 12.6 g/dL — ABNORMAL LOW (ref 13.0–17.0)
MCH: 32 pg (ref 26.0–34.0)
MCHC: 33.6 g/dL (ref 30.0–36.0)
MCV: 95.2 fL (ref 80.0–100.0)
Platelets: 365 10*3/uL (ref 150–400)
RBC: 3.94 MIL/uL — ABNORMAL LOW (ref 4.22–5.81)
RDW: 14.6 % (ref 11.5–15.5)
WBC: 4.6 10*3/uL (ref 4.0–10.5)
nRBC: 0 % (ref 0.0–0.2)

## 2020-09-18 LAB — GLUCOSE, CAPILLARY
Glucose-Capillary: 92 mg/dL (ref 70–99)
Glucose-Capillary: 106 mg/dL — ABNORMAL HIGH (ref 70–99)
Glucose-Capillary: 113 mg/dL — ABNORMAL HIGH (ref 70–99)
Glucose-Capillary: 88 mg/dL (ref 70–99)

## 2020-09-18 LAB — URINE CULTURE: Culture: >=100000 — AB

## 2020-09-18 NOTE — Plan of Care (Signed)

## 2020-09-18 NOTE — Progress Notes (Signed)
PROGRESS NOTE  Todd [name]. OU:[id number] DOB: 1956-06-03 DOA: 09/03/2020 PCP: [name], MD   LOS: 15 days   Brief Narrative / Interim history: 65 year old with history of DM2, HTN, CKD, alcohol/tobacco and heroin abuse presented from homeless shelter when he was found down and intubated in the ER. There was suspicion for seizure-like activity after intubation. Also found to have COVID-19. CT head showed bilateral chronic infarct, moderate cerebellar disease but nothing acute. EEG showed diffuse encephalopathy. LP was negative for meningitis. Initially received vancomycin, Zosyn and acyclovir which was later transitioned to Unasyn for aspiration pneumonia. Seen by cardiology due to elevated troponin. Also seen by GI due to severe transaminitis, currently advising to monitor. LFTs are improving. PT consulted.  Subjective / 24h Interval events: He tells me he is feeling slightly more short of breath today.  Assessment & Plan: Principal Problem Acute hypoxic respiratory failure due to COVID-19 infection, aspiration pneumonia -Initially admitted to the ICU and was intubated.  Successfully extubated on 1/20, currently on room air.  Due to bilateral patchy infiltrates and recent fevers over the past weekend, elevated procalcitonin he is on ceftriaxone and azithromycin.  Plan for total of 5 days, today day #4 and discontinue antibiotics after tomorrow's dose.  He initially tested positive for 1/19, given respiratory involvement will need to be isolated for 21 days.  Did not receive Remdesivir or steroids on admission  Active Problems Acute metabolic encephalopathy, unresponsive episode-there were initial concerns for acute meningitis/encephalitis.  He was initially on broad-spectrum antibiotics with vancomycin, Zosyn, acyclovir, underwent an LP which was negative.  Antibiotics were discontinued.  He underwent an EEG which showed diffuse encephalopathy without seizures, and UDS was  negative.  Mental status returned to baseline  Elevated troponin-2D echo showed EF 123456, grade 1 diastolic dysfunction.  Cardiology saw him and recommended for outpatient ischemic evaluation on discharge  Acute kidney injury on chronic kidney disease stage IIIa-creatinine on admission improving, currently at 1.3  Transaminitis, alcohol pattern, history of hep C-significant elevated liver enzymes on admission, concern for shock liver versus ischemic hepatitis versus metabolic hepatitis.  GI consulted and evaluated patient.  LFTs improving  Mild rhabdomyolysis-resolved with IV fluids  Diarrhea-C. difficile negative, possibly due to COVID-19  Microcytic anemia-suspect from underlying alcohol use.  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  He was on Coumadin in the past  History of dilated aorta-outpatient follow-up with cardiothoracic surgery  UTI with sepsis-on Ceftriaxone   Scheduled Meds: . amLODipine  5 mg Oral Daily  . aspirin EC  81 mg Oral Daily  . azithromycin  500 mg Oral q1800  . Chlorhexidine Gluconate Cloth  6 each Topical Daily  . docusate sodium  100 mg Oral BID  . folic acid  1 mg Oral Daily  . heparin  5,000 Units Subcutaneous Q8H  . insulin aspart  0-9 Units Subcutaneous TID WC & HS  . metoprolol tartrate  25 mg Oral BID  . multivitamin with minerals  1 tablet Oral Daily  . pantoprazole  40 mg Oral QHS  . polyethylene glycol  17 g Oral Daily  . thiamine  100 mg Oral Daily   Continuous Infusions: . cefTRIAXone (ROCEPHIN)  IV Stopped (09/17/20 1827)   PRN Meds:.acetaminophen, albuterol, dextromethorphan-guaiFENesin, ondansetron (ZOFRAN) IV, polyethylene glycol  Diet Orders (From admission, onward)    Start     Ordered   09/05/20 1413  DIET DYS 3 Room service appropriate?  Yes; Fluid consistency: Thin  Diet effective now       Question Answer Comment  Room service  appropriate? Yes   Fluid consistency: Thin      09/05/20 1412          DVT prophylaxis: heparin injection 5,000 Units Start: 09/03/20 2200 SCDs Start: 09/03/20 1821     Code Status: Full Code  Family Communication: no family at bedside   Status is: Inpatient  Remains inpatient appropriate because:Inpatient level of care appropriate due to severity of illness   Dispo: The patient is from: Home              Anticipated d/c is to: SNF              Anticipated d/c date is: 2 days              Patient currently is not medically stable to d/c.   Difficult to place patient No  Level of care: Med-Surg  Consultants:  none  Procedures:  None   Microbiology  None   Antimicrobials: Ceftriaxone / Azithromycin    Objective: Vitals:   09/17/20 0301 09/17/20 1525 09/17/20 1949 09/18/20 0415  BP: 116/78 113/68 125/80 116/76  Pulse: 82 76 84 78  Resp: 18 20 16 16   Temp: 98 F (36.7 C) 98.7 F (37.1 C) 98 F (36.7 C) 98 F (36.7 C)  TempSrc: Axillary  Oral Oral  SpO2: 92% 95% 91% 94%  Weight:      Height:        Intake/Output Summary (Last 24 hours) at 09/18/2020 1005 Last data filed at 09/17/2020 1837 Gross per 24 hour  Intake 700 ml  Output 600 ml  Net 100 ml   Filed Weights   09/11/20 0500 09/12/20 0500 09/13/20 0500  Weight: 77.9 kg 81.4 kg 79.1 kg    Examination:  Constitutional: No distress Eyes: No icterus ENMT: mmm Neck: normal, supple Respiratory: Bibasilar rhonchi, no wheezing, no crackles, slightly tachypneic Cardiovascular: Regular rate and rhythm, no murmurs, no peripheral edema Abdomen: Soft, NT, ND, bowel sounds positive Musculoskeletal: no clubbing / cyanosis.  Skin: No rashes seen Neurologic: Nonfocal  Data Reviewed: I have independently reviewed following labs and imaging studies   CBC: Recent Labs  Lab 09/16/20 0244 09/17/20 0420 09/18/20 0838  WBC 4.8 4.6 4.6  HGB 12.7* 12.1* 12.6*  HCT 37.6* 35.2* 37.5*  MCV 94.5 96.7 95.2   PLT 303 335 621   Basic Metabolic Panel: Recent Labs  Lab 09/16/20 0244 09/17/20 0420 09/18/20 0838  NA 140 140 139  K 4.1 3.9 4.0  CL 109 110 110  CO2 17* 17* 20*  GLUCOSE 96 120* 111*  BUN 19 15 10   CREATININE 1.72* 1.62* 1.30*  CALCIUM 8.8* 8.9 9.1   Liver Function Tests: Recent Labs  Lab 09/16/20 0244 09/17/20 0420  AST 75* 49*  ALT 107* 80*  ALKPHOS 45 50  BILITOT 0.7 0.6  PROT 7.4 7.4  ALBUMIN 2.4* 2.3*   Coagulation Profile: No results for input(s): INR, PROTIME in the last 168 hours. HbA1C: No results for input(s): HGBA1C in the last 72 hours. CBG: Recent Labs  Lab 09/17/20 0805 09/17/20 1205 09/17/20 1655 09/17/20 1947 09/18/20 0757  GLUCAP 92 127* 87 108* 92    Recent Results (from the past 240 hour(s))  Culture, blood (routine x 2)     Status: None (Preliminary result)   Collection Time: 09/15/20  2:26 PM   Specimen: BLOOD  Result Value  Ref Range Status   Specimen Description BLOOD RIGHT ANTECUBITAL  Final   Special Requests   Final    BOTTLES DRAWN AEROBIC ONLY Blood Culture adequate volume   Culture   Final    NO GROWTH 3 DAYS Performed at [facility], [address], [place], [place] [address]    Report Status PENDING  Incomplete  Culture, blood (routine x 2)     Status: None (Preliminary result)   Collection Time: 09/15/20  2:56 PM   Specimen: BLOOD  Result Value Ref Range Status   Specimen Description BLOOD RIGHT ANTECUBITAL  Final   Special Requests   Final    BOTTLES DRAWN AEROBIC ONLY Blood Culture adequate volume   Culture   Final    NO GROWTH 3 DAYS Performed at [facility], [address], [place], [place] [address]    Report Status PENDING  Incomplete  Culture, Urine     Status: Abnormal   Collection Time: 09/15/20  4:25 PM   Specimen: Urine, Random  Result Value Ref Range Status   Specimen Description URINE, RANDOM  Final   Special Requests   Final    NONE Performed at [facility], [place]  [address], [place], [place] [address]    Culture >=100,000 COLONIES/mL ESCHERICHIA COLI (A)  Final   Report Status 09/18/2020 FINAL  Final   Organism ID, Bacteria ESCHERICHIA COLI (A)  Final      Susceptibility   Escherichia coli - MIC*    AMPICILLIN >=32 RESISTANT Resistant     CEFAZOLIN <=4 SENSITIVE Sensitive     CEFEPIME <=0.12 SENSITIVE Sensitive     CEFTRIAXONE <=0.25 SENSITIVE Sensitive     CIPROFLOXACIN 0.5 SENSITIVE Sensitive     GENTAMICIN <=1 SENSITIVE Sensitive     IMIPENEM <=0.25 SENSITIVE Sensitive     NITROFURANTOIN <=16 SENSITIVE Sensitive     TRIMETH/SULFA >=320 RESISTANT Resistant     AMPICILLIN/SULBACTAM >=32 RESISTANT Resistant     PIP/TAZO <=4 SENSITIVE Sensitive     * >=100,000 COLONIES/mL ESCHERICHIA COLI     Radiology Studies: DG CHEST PORT 1 VIEW  Result Date: 09/18/2020 CLINICAL DATA:  Dyspnea EXAM: PORTABLE CHEST 1 VIEW COMPARISON:  Portable exam 0824 hours compared to 09/15/2020 FINDINGS: Normal heart size, mediastinal contours, and pulmonary vascularity. Persistent scattered BILATERAL pulmonary infiltrates consistent with multifocal pneumonia. No pleural effusion or pneumothorax. Osseous structures unremarkable. IMPRESSION: Patchy BILATERAL pulmonary infiltrates consistent with multifocal pneumonia, little changed. Electronically Signed   By: Lavonia Dana M.D.   On: 09/18/2020 08:44     Marzetta Board, MD, PhD [facility]  Between 7 am - 7 pm I am available, please contact me via Amion or Securechat  Between 7 pm - 7 am I am not available, please contact night coverage MD/APP via Amion

## 2020-09-18 NOTE — TOC Progression Note (Addendum)
Transition of Care (TOC) - Progression Note    Patient Details  Name: Todd [name]. MRN: [id number] Date of Birth: 08/18/1955  Transition of Care [facility] CM/SW [place], RN Phone Number: [phone]  09/18/2020, 10:48 AM  Clinical Narrative:    CM spoke with Ihor Gully at the [facility]. [place] Ar confirms that patient must be independent and able to complete bathing, washing clothes, toileting etc. independently. Patient will also need to complete a negative covid test prior to returning to the shelter. Patient must be reminded that this is a 90 day program and he must continue to work on active housing plan. Sydnee Cabal confirmed that patient must have a negative covid test prior to returning to shelter. CM will follow up with nurse to make sure patient has an understanding of the expectations for return to shelter. MD has been updated.  1500 Patient requesting to speak with CM. Cm called patient and patient verbalized concern for where he will go when discharged. Patient states that he does not want to loose his bed at shelter. CM explained to patient the he would need to be covid negative, completely independent with ADL's & managing his laundry and able to continue his plan to seek housing. Patient states that he is completely independent and can care for himself and wants to know how he can prove that he is independent. CM explained to patient that he would need to work with PT and show that he is able to independently function. Patient is agreeable to see PT. Message has been sent to PT. PT is currently not assigned but PT will pick this patient up on their patient load and assess him.    Expected Discharge Plan: Homeless Shelter Barriers to Discharge: Active Substance Use - Placement,ED Unsafe disposition,Homeless with medical needs  Expected Discharge Plan and Services Expected Discharge Plan: Homeless Shelter In-house Referral: NA Discharge Planning Services: CM  Consult   Living arrangements for the past 2 months: Homeless                 DME Arranged: N/A                     Social Determinants of Health (SDOH) Interventions    Readmission Risk Interventions Readmission Risk Prevention Plan 09/16/2020  Transportation Screening Complete  PCP or Specialist Appt within 5-7 Days Complete  Home Care Screening Complete  Medication Review (RN CM) Referral to Pharmacy  Some recent data might be hidden

## 2020-09-18 NOTE — Progress Notes (Signed)
Physical Therapy Treatment Patient Details Name: Todd [name]. MRN: [id number] DOB: 03/23/1956 Today's Date: 09/18/2020    History of Present Illness 65 year old with history of DM2, HTN, CKD, alcohol/tobacco and heroin abuse, Hepatitis C presented 09/03/20 from homeless shelter when he was found down. Intubated 1/19-1/20.  There was suspicion for seizure-like activity after intubation. UDS negative.   CT head showed bilateral chronic infarct, moderate cerebellar disease but nothing acute.  EEG showed diffuse encephalopathy.  LP was negative for meningitis; Also found to have COVID-19.+aspiration pneumonia;    PT Comments    Patient did recall most of the instruction he had on use of rollator from last visit. He demonstrated safe use of rollator until the end of his walk. He became fatigued and a bit "wheezy" and did not want to sit and rest on the rollator seat, he wanted to get back to his room. As approaching his bed to sit down, he "parked" the rollator off to the side and attempted to walk around it to reach the bed. Patient noted that he had been getting anxious to sit down due to fatigue and should have sat and rested on the rollator seat instead of rushing to get back to sit on the bed. He reports he hopes to feel ready to return to [facility] "in a day or so."    Follow Up Recommendations  Home health PT (at [facility])     Equipment Recommendations  Other (comment) (rollator)    Recommendations for Other Services       Precautions / Restrictions Precautions Precautions: Fall    Mobility  Bed Mobility Overal bed mobility: Needs Assistance Bed Mobility: Supine to Sit;Sit to Supine     Supine to sit: Supervision Sit to supine: Supervision   General bed mobility comments: bed flat; no physical assist needed,  Transfers Overall transfer level: Needs assistance Equipment used: 4-wheeled walker Transfers: Sit to/from Stand Sit to Stand: Min  guard         General transfer comment: demonstrated to pt correct use during transfer from bed; pt return demonstratedwith proper technique;return demonstrated transfer to sitting on the seat of the rollator; tried to "park" rollator off to the side as he approached the bed to sit down  Ambulation/Gait Ambulation/Gait assistance: Min guard;Supervision Gait Distance (Feet): 75 Feet Assistive device: 4-wheeled walker Gait Pattern/deviations: Step-through pattern;Decreased stride length;Trunk flexed Gait velocity: decr   General Gait Details: used rollator appropriately, including 180 degree turn, stepping backwards into bathroom and pulling walker over the threshold   Stairs             Wheelchair Mobility    Modified Rankin (Stroke Patients Only)       Balance Overall balance assessment: Needs assistance Sitting-balance support: No upper extremity supported;Feet supported Sitting balance-Leahy Scale: Fair     Standing balance support: Single extremity supported;Bilateral upper extremity supported Standing balance-Leahy Scale: Poor Standing balance comment: reliant on at least one UE support                            Cognition Arousal/Alertness: Awake/alert Behavior During Therapy: WFL for tasks assessed/performed Overall Cognitive Status: No family/caregiver present to determine baseline cognitive functioning Area of Impairment: Safety/judgement;Following commands;Problem solving;Awareness                 Orientation Level:  (NT) Current Attention Level: Sustained   Following Commands: Follows one step commands consistently Safety/Judgement: Decreased  awareness of deficits;Decreased awareness of safety Awareness: Emergent Problem Solving: Slow processing General Comments: Much quicker following commands this date.      Exercises      General Comments        Pertinent Vitals/Pain Pain Assessment: No/denies pain    Home Living                       Prior Function            PT Goals (current goals can now be found in the care plan section) Acute Rehab PT Goals Patient Stated Goal: to get a rollator Time For Goal Achievement: 09/21/20 Potential to Achieve Goals: Fair Progress towards PT goals: Progressing toward goals    Frequency    Min 2X/week      PT Plan Discharge plan needs to be updated;Frequency needs to be updated    Co-evaluation              AM-PAC PT "6 Clicks" Mobility   Outcome Measure  Help needed turning from your back to your side while in a flat bed without using bedrails?: None Help needed moving from lying on your back to sitting on the side of a flat bed without using bedrails?: None Help needed moving to and from a bed to a chair (including a wheelchair)?: A Little Help needed standing up from a chair using your arms (e.g., wheelchair or bedside chair)?: None Help needed to walk in hospital room?: A Little Help needed climbing 3-5 steps with a railing? : A Little 6 Click Score: 21    End of Session   Activity Tolerance: Patient tolerated treatment well Patient left: in bed;with call bell/phone within reach Nurse Communication: Mobility status;Other (comment) (ok to walk in room with RW for exercise) PT Visit Diagnosis: Muscle weakness (generalized) (M62.81);Other abnormalities of gait and mobility (R26.89);Pain     Time: 7673-4193 PT Time Calculation (min) (ACUTE ONLY): 19 min  Charges:  $Gait Training: 8-22 mins                      Arby Barrette, PT Pager [phone]    [name] 09/18/2020, 4:16 PM

## 2020-09-19 DIAGNOSIS — N179 Acute kidney failure, unspecified: Secondary | ICD-10-CM | POA: Diagnosis not present

## 2020-09-19 DIAGNOSIS — J9602 Acute respiratory failure with hypercapnia: Secondary | ICD-10-CM | POA: Diagnosis not present

## 2020-09-19 LAB — CBC
HCT: 35 % — ABNORMAL LOW (ref 39.0–52.0)
Hemoglobin: 11.7 g/dL — ABNORMAL LOW (ref 13.0–17.0)
MCH: 31.7 pg (ref 26.0–34.0)
MCHC: 33.4 g/dL (ref 30.0–36.0)
MCV: 94.9 fL (ref 80.0–100.0)
Platelets: 349 10*3/uL (ref 150–400)
RBC: 3.69 MIL/uL — ABNORMAL LOW (ref 4.22–5.81)
RDW: 14.5 % (ref 11.5–15.5)
WBC: 5 10*3/uL (ref 4.0–10.5)
nRBC: 0 % (ref 0.0–0.2)

## 2020-09-19 LAB — BASIC METABOLIC PANEL
Anion gap: 12 (ref 5–15)
BUN: 10 mg/dL (ref 8–23)
CO2: 18 mmol/L — ABNORMAL LOW (ref 22–32)
Calcium: 9.3 mg/dL (ref 8.9–10.3)
Chloride: 110 mmol/L (ref 98–111)
Creatinine, Ser: 1.3 mg/dL — ABNORMAL HIGH (ref 0.61–1.24)
GFR, Estimated: >60 mL/min (ref >60)
Glucose, Bld: 98 mg/dL (ref 70–99)
Potassium: 4.3 mmol/L (ref 3.5–5.1)
Sodium: 140 mmol/L (ref 135–145)

## 2020-09-19 LAB — GLUCOSE, CAPILLARY
Glucose-Capillary: 93 mg/dL (ref 70–99)
Glucose-Capillary: 122 mg/dL — ABNORMAL HIGH (ref 70–99)
Glucose-Capillary: 105 mg/dL — ABNORMAL HIGH (ref 70–99)
Glucose-Capillary: 126 mg/dL — ABNORMAL HIGH (ref 70–99)

## 2020-09-19 NOTE — Progress Notes (Signed)
Physical Therapy Treatment Patient Details Name: Todd [name]. MRN: [id number] DOB: 1956/07/03 Today's Date: 09/19/2020    History of Present Illness 65 year old with history of DM2, HTN, CKD, alcohol/tobacco and heroin abuse, Hepatitis C presented 09/03/20 from homeless shelter when he was found down. Intubated 1/19-1/20.  There was suspicion for seizure-like activity after intubation. UDS negative.   CT head showed bilateral chronic infarct, moderate cerebellar disease but nothing acute.  EEG showed diffuse encephalopathy.  LP was negative for meningitis; Also found to have COVID-19.+aspiration pneumonia;    PT Comments    Pt admitted with above diagnosis. Pt was able to ambulate with Rollator and incr distance with good safety awareness and stability. Upon entering room, pt asked PT if he could get a back brace so he could stand taller.  PT secure chatted MD to ask him to address this with pt.  Pt did well during treatment today and making good progress. Pt has met 0/3 goals and goals revised today.  Pt currently with functional limitations due to balance and endurance deficits. Pt will benefit from skilled PT to increase their independence and safety with mobility to allow discharge to the venue listed below.    Follow Up Recommendations  Home health PT (at [facility])     Equipment Recommendations  Other (comment) (rollator)    Recommendations for Other Services OT consult     Precautions / Restrictions Precautions Precautions: Fall Precaution Comments: _0 Restrictions Weight Bearing Restrictions: No    Mobility  Bed Mobility Overal bed mobility: Needs Assistance Bed Mobility: Supine to Sit;Sit to Supine     Supine to sit: Supervision Sit to supine: Supervision   General bed mobility comments: bed flat; no physical assist needed,  Transfers Overall transfer level: Needs assistance Equipment used: 4-wheeled walker Transfers: Sit to/from Stand Sit to  Stand: Supervision         General transfer comment: demonstrated to pt correct use during transfer from bed; pt return demonstratedwith proper technique;return demonstrated transfer to sitting on the seat of the rollator  Ambulation/Gait Ambulation/Gait assistance: Supervision Gait Distance (Feet): 150 Feet Assistive device: 4-wheeled walker Gait Pattern/deviations: Step-through pattern;Decreased stride length;Trunk flexed Gait velocity: decr Gait velocity interpretation: 1.31 - 2.62 ft/sec, indicative of limited community ambulator General Gait Details: used rollator appropriately, including 180 degree turn, stepping backwards into bathroom and pulling walker over the threshold   Stairs             Wheelchair Mobility    Modified Rankin (Stroke Patients Only)       Balance Overall balance assessment: Needs assistance Sitting-balance support: No upper extremity supported;Feet supported Sitting balance-Leahy Scale: Fair     Standing balance support: Single extremity supported;Bilateral upper extremity supported Standing balance-Leahy Scale: Poor Standing balance comment: reliant on at least one UE support                            Cognition Arousal/Alertness: Awake/alert Behavior During Therapy: WFL for tasks assessed/performed Overall Cognitive Status: No family/caregiver present to determine baseline cognitive functioning Area of Impairment: Safety/judgement;Following commands;Problem solving;Awareness                 Orientation Level:  (NT) Current Attention Level: Sustained Memory: Decreased short-term memory Following Commands: Follows one step commands consistently Safety/Judgement: Decreased awareness of deficits;Decreased awareness of safety Awareness: Emergent Problem Solving: Slow processing General Comments: Much quicker following commands this date.  Exercises General Exercises - Lower Extremity Long Arc Quad:  AROM;Both;10 reps;Seated Toe Raises: AROM;Both;10 reps Heel Raises: AROM;Both;10 reps    General Comments        Pertinent Vitals/Pain Pain Assessment: No/denies pain    Home Living                      Prior Function            PT Goals (current goals can now be found in the care plan section) Acute Rehab PT Goals Patient Stated Goal: to get a rollator PT Goal Formulation: With patient Time For Goal Achievement: 10/03/20 Potential to Achieve Goals: Fair Progress towards PT goals: Progressing toward goals    Frequency    Min 2X/week      PT Plan Current plan remains appropriate    Co-evaluation              AM-PAC PT "6 Clicks" Mobility   Outcome Measure  Help needed turning from your back to your side while in a flat bed without using bedrails?: None Help needed moving from lying on your back to sitting on the side of a flat bed without using bedrails?: None Help needed moving to and from a bed to a chair (including a wheelchair)?: A Little Help needed standing up from a chair using your arms (e.g., wheelchair or bedside chair)?: None Help needed to walk in hospital room?: A Little Help needed climbing 3-5 steps with a railing? : A Little 6 Click Score: 21    End of Session Equipment Utilized During Treatment: Gait belt Activity Tolerance: Patient tolerated treatment well Patient left: with call bell/phone within reach;in chair;with chair alarm set Nurse Communication: Mobility status;Other (comment) (ok to walk in room with RW for exercise) PT Visit Diagnosis: Muscle weakness (generalized) (M62.81);Other abnormalities of gait and mobility (R26.89);Pain Pain - Right/Left:  (midline) Pain - part of body:  (abdomen)     Time: 0737-1062 PT Time Calculation (min) (ACUTE ONLY): 26 min  Charges:  $Gait Training: 8-22 mins $Therapeutic Exercise: 8-22 mins                      W,PT Acute Rehabilitation Services Pager:  [phone]   Office:  [place] 09/19/2020, 2:08 PM

## 2020-09-19 NOTE — TOC Progression Note (Signed)
Transition of Care (TOC) - Progression Note    Patient Details  Name: Todd [name]. MRN: [id number] Date of Birth: [birth year]  Transition of Care [facility]) CM/SW [place], RN Phone Number: [phone]  09/19/2020, 9:33 AM  Clinical Narrative:    Cm spoke with Ihor Gully at [facility] to make her aware that therapy has cleared patient to return. The patient must also have a negative covid test and results will need to be submitted to the [facility] prior to patients readmission. [facility] is going to present patients information to management for assessment to determine if the [facility] shelter will be the best place for the patient after discharge. At this time the [facility] can not guarantee that the patient will have a bed at the shelter.   5 Cm received message from MD asking if patient would be able to return to shelter this weekend if covid test is negative. CM spoke with Ihor Gully at the [facility] and [facility] confirms that the patient is not able to return to [facility] over the weekend. [facility] also states that the patient must have a negative covid test and be cleared from the pneumonia by Monday in order to return to the shelter. If patient does not return to shelter by Monday he will need to submit a new application. CM to call Brianna on Monday with an update. CM has called patient and updated him. MD has been updated. TOC will continue to follow.    Expected Discharge Plan: Homeless Shelter Barriers to Discharge: Active Substance Use - Placement,ED Unsafe disposition,Homeless with medical needs  Expected Discharge Plan and Services Expected Discharge Plan: Homeless Shelter In-house Referral: NA Discharge Planning Services: CM Consult   Living arrangements for the past 2 months: Homeless                 DME Arranged: N/A                     Social Determinants of Health (SDOH) Interventions     Readmission Risk Interventions Readmission Risk Prevention Plan 09/16/2020  Transportation Screening Complete  PCP or Specialist Appt within 5-7 Days Complete  Home Care Screening Complete  Medication Review (RN CM) Referral to Pharmacy  Some recent data might be hidden

## 2020-09-19 NOTE — Progress Notes (Signed)
PROGRESS NOTE  Todd [name]. ZOX:[id number] DOB: 1955/10/21 DOA: 09/03/2020 PCP: [name], MD   LOS: 16 days   Brief Narrative / Interim history: 65 year old with history of DM2, HTN, CKD, alcohol/tobacco and heroin abuse presented from homeless shelter when he was found down and intubated in the ER. There was suspicion for seizure-like activity after intubation. Also found to have COVID-19. CT head showed bilateral chronic infarct, moderate cerebellar disease but nothing acute. EEG showed diffuse encephalopathy. LP was negative for meningitis. Initially received vancomycin, Zosyn and acyclovir which was later transitioned to Unasyn for aspiration pneumonia. Seen by cardiology due to elevated troponin. Also seen by GI due to severe transaminitis, currently advising to monitor. LFTs are improving. PT consulted.  Subjective / 24h Interval events: Feeling well, intermittent shortness of breath still persistent.  On room air.  Assessment & Plan: Principal Problem Acute hypoxic respiratory failure due to COVID-19 infection, aspiration pneumonia -Initially admitted to the ICU and was intubated.  Successfully extubated on 1/20, currently on room air.  Due to bilateral patchy infiltrates and recent fevers over the past weekend, elevated procalcitonin he is on ceftriaxone and azithromycin.  Plan for total of 5 days, today day #4 and discontinue antibiotics after tomorrow's dose.  He initially tested positive for 1/19, given respiratory involvement will need to be isolated for 21 days.  Did not receive Remdesivir or steroids on admission  Active Problems Acute metabolic encephalopathy, unresponsive episode-there were initial concerns for acute meningitis/encephalitis.  He was initially on broad-spectrum antibiotics with vancomycin, Zosyn, acyclovir, underwent an LP which was negative.  Antibiotics were discontinued.  He underwent an EEG which showed diffuse encephalopathy without  seizures, and UDS was negative.  Mental status returned to baseline  Elevated troponin-2D echo showed EF 81%, grade 1 diastolic dysfunction.  Cardiology saw him and recommended for outpatient ischemic evaluation on discharge  Acute kidney injury on chronic kidney disease stage IIIa-creatinine on admission improving, currently at 1.3  Transaminitis, alcohol pattern, history of hep C-significant elevated liver enzymes on admission, concern for shock liver versus ischemic hepatitis versus metabolic hepatitis.  GI consulted and evaluated patient.  LFTs improving  Mild rhabdomyolysis-resolved with IV fluids  Diarrhea-C. difficile negative, possibly due to COVID-19  Microcytic anemia-suspect from underlying alcohol use.  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  He was on Coumadin in the past  History of dilated aorta-outpatient follow-up with cardiothoracic surgery  UTI with sepsis-on Ceftriaxone  Disposition: Stable to be discharged, however denies SNF and wants to go back to shelter.  She also has requirement for Covid to be negative, will retest today  Scheduled Meds: . amLODipine  5 mg Oral Daily  . aspirin EC  81 mg Oral Daily  . azithromycin  500 mg Oral q1800  . Chlorhexidine Gluconate Cloth  6 each Topical Daily  . docusate sodium  100 mg Oral BID  . folic acid  1 mg Oral Daily  . heparin  5,000 Units Subcutaneous Q8H  . insulin aspart  0-9 Units Subcutaneous TID WC & HS  . metoprolol tartrate  25 mg Oral BID  . multivitamin with minerals  1 tablet Oral Daily  . pantoprazole  40 mg Oral QHS  . polyethylene glycol  17 g Oral Daily  . thiamine  100 mg Oral Daily   Continuous Infusions: . cefTRIAXone (ROCEPHIN)  IV 2 g (09/18/20 1734)   PRN Meds:.acetaminophen, albuterol, dextromethorphan-guaiFENesin, ondansetron (ZOFRAN)  IV, polyethylene glycol  Diet Orders (From admission,  onward)    Start     Ordered   09/05/20 1413  DIET DYS 3 Room service appropriate? Yes; Fluid consistency: Thin  Diet effective now       Question Answer Comment  Room service appropriate? Yes   Fluid consistency: Thin      09/05/20 1412          DVT prophylaxis: heparin injection 5,000 Units Start: 09/03/20 2200 SCDs Start: 09/03/20 1821     Code Status: Full Code  Family Communication: no family at bedside   Status is: Inpatient  Remains inpatient appropriate because:Inpatient level of care appropriate due to severity of illness   Dispo: The patient is from: Home              Anticipated d/c is to: SNF              Anticipated d/c date is: 2 days              Patient currently is not medically stable to d/c.   Difficult to place patient No  Level of care: Med-Surg  Consultants:  none  Procedures:  None   Microbiology  None   Antimicrobials: Ceftriaxone / Azithromycin    Objective: Vitals:   09/18/20 0415 09/18/20 1616 09/18/20 2100 09/19/20 0809  BP: 116/76 123/78 132/87 127/66  Pulse: 78 79 80 80  Resp: 16 20 20 20   Temp: 98 F (36.7 C)  99.3 F (37.4 C) 99.1 F (37.3 C)  TempSrc: Oral   Oral  SpO2: 94% 97% 99% 92%  Weight:      Height:        Intake/Output Summary (Last 24 hours) at 09/19/2020 1218 Last data filed at 09/19/2020 1100 Gross per 24 hour  Intake --  Output 450 ml  Net -450 ml   Filed Weights   09/11/20 0500 09/12/20 0500 09/13/20 0500  Weight: 77.9 kg 81.4 kg 79.1 kg    Examination:  Constitutional: NAD Respiratory: Faint rhonchi, no wheezing Cardiovascular: Regular rate and rhythm, no murmurs   Data Reviewed: I have independently reviewed following labs and imaging studies   CBC: Recent Labs  Lab 09/16/20 0244 09/17/20 0420 09/18/20 0838 09/19/20 0229  WBC 4.8 4.6 4.6 5.0  HGB 12.7* 12.1* 12.6* 11.7*  HCT 37.6* 35.2* 37.5* 35.0*  MCV 94.5 96.7 95.2 94.9  PLT 303 335 365 387   Basic Metabolic Panel: Recent  Labs  Lab 09/16/20 0244 09/17/20 0420 09/18/20 0838 09/19/20 0229  NA 140 140 139 140  K 4.1 3.9 4.0 4.3  CL 109 110 110 110  CO2 17* 17* 20* 18*  GLUCOSE 96 120* 111* 98  BUN 19 15 10 10   CREATININE 1.72* 1.62* 1.30* 1.30*  CALCIUM 8.8* 8.9 9.1 9.3   Liver Function Tests: Recent Labs  Lab 09/16/20 0244 09/17/20 0420  AST 75* 49*  ALT 107* 80*  ALKPHOS 45 50  BILITOT 0.7 0.6  PROT 7.4 7.4  ALBUMIN 2.4* 2.3*   Coagulation Profile: No results for input(s): INR, PROTIME in the last 168 hours. HbA1C: No results for input(s): HGBA1C in the last 72 hours. CBG: Recent Labs  Lab 09/18/20 1206 09/18/20 1616 09/18/20 2101 09/19/20 0807 09/19/20 1142  GLUCAP 106* 113* 88 93 122*    Recent Results (from the past 240 hour(s))  Culture, blood (routine x 2)     Status: None (Preliminary result)   Collection Time: 09/15/20  2:26  PM   Specimen: BLOOD  Result Value Ref Range Status   Specimen Description BLOOD RIGHT ANTECUBITAL  Final   Special Requests   Final    BOTTLES DRAWN AEROBIC ONLY Blood Culture adequate volume   Culture   Final    NO GROWTH 4 DAYS Performed at [facility], [address], [place], [place] [address]    Report Status PENDING  Incomplete  Culture, blood (routine x 2)     Status: None (Preliminary result)   Collection Time: 09/15/20  2:56 PM   Specimen: BLOOD  Result Value Ref Range Status   Specimen Description BLOOD RIGHT ANTECUBITAL  Final   Special Requests   Final    BOTTLES DRAWN AEROBIC ONLY Blood Culture adequate volume   Culture   Final    NO GROWTH 4 DAYS Performed at [facility], [place] [address], [place], [place] [address]    Report Status PENDING  Incomplete  Culture, Urine     Status: Abnormal   Collection Time: 09/15/20  4:25 PM   Specimen: Urine, Random  Result Value Ref Range Status   Specimen Description URINE, RANDOM  Final   Special Requests   Final    NONE Performed at [facility], [place] [address], [place], [place] [address]    Culture >=100,000 COLONIES/mL ESCHERICHIA COLI (A)  Final   Report Status 09/18/2020 FINAL  Final   Organism ID, Bacteria ESCHERICHIA COLI (A)  Final      Susceptibility   Escherichia coli - MIC*    AMPICILLIN >=32 RESISTANT Resistant     CEFAZOLIN <=4 SENSITIVE Sensitive     CEFEPIME <=0.12 SENSITIVE Sensitive     CEFTRIAXONE <=0.25 SENSITIVE Sensitive     CIPROFLOXACIN 0.5 SENSITIVE Sensitive     GENTAMICIN <=1 SENSITIVE Sensitive     IMIPENEM <=0.25 SENSITIVE Sensitive     NITROFURANTOIN <=16 SENSITIVE Sensitive     TRIMETH/SULFA >=320 RESISTANT Resistant     AMPICILLIN/SULBACTAM >=32 RESISTANT Resistant     PIP/TAZO <=4 SENSITIVE Sensitive     * >=100,000 COLONIES/mL ESCHERICHIA COLI     Radiology Studies: No results found.   Marzetta Board, MD, PhD [facility]  Between 7 am - 7 pm I am available, please contact me via Amion or Securechat  Between 7 pm - 7 am I am not available, please contact night coverage MD/APP via Amion

## 2020-09-20 DIAGNOSIS — J9601 Acute respiratory failure with hypoxia: Secondary | ICD-10-CM | POA: Diagnosis not present

## 2020-09-20 DIAGNOSIS — J9602 Acute respiratory failure with hypercapnia: Secondary | ICD-10-CM | POA: Diagnosis not present

## 2020-09-20 LAB — GLUCOSE, CAPILLARY
Glucose-Capillary: 90 mg/dL (ref 70–99)
Glucose-Capillary: 94 mg/dL (ref 70–99)
Glucose-Capillary: 99 mg/dL (ref 70–99)
Glucose-Capillary: 120 mg/dL — ABNORMAL HIGH (ref 70–99)
Glucose-Capillary: 93 mg/dL (ref 70–99)

## 2020-09-20 LAB — CULTURE, BLOOD (ROUTINE X 2)
Culture: NO GROWTH
Culture: NO GROWTH
Special Requests: ADEQUATE
Special Requests: ADEQUATE

## 2020-09-20 LAB — SARS CORONAVIRUS 2 (TAT 6-24 HRS): SARS Coronavirus 2: POSITIVE — AB

## 2020-09-20 MED ORDER — CLOTRIMAZOLE 1 % EX CREA
TOPICAL_CREAM | Freq: Two times a day (BID) | CUTANEOUS | Status: DC
  Filled 2020-09-20: qty 15

## 2020-09-20 NOTE — Progress Notes (Signed)
PROGRESS NOTE  [name] ZOX:[id number] DOB: 07-22-1956 DOA: 09/03/2020 PCP: [name], MD   LOS: 17 days   Brief Narrative / Interim history: 65 year old with history of DM2, HTN, CKD, alcohol/tobacco and heroin abuse presented from homeless shelter when he was found down and intubated in the ER. There was suspicion for seizure-like activity after intubation. Also found to have COVID-19. CT head showed bilateral chronic infarct, moderate cerebellar disease but nothing acute. EEG showed diffuse encephalopathy. LP was negative for meningitis. Initially received vancomycin, Zosyn and acyclovir which was later transitioned to Unasyn for aspiration pneumonia. Seen by cardiology due to elevated troponin. Also seen by GI due to severe transaminitis, currently advising to monitor. LFTs are improving. PT consulted.  Subjective / 24h Interval events: Complains of chronic back pain and chronic knee pain, complains of whitish discharge from his penis and discomfort  Assessment & Plan: Principal Problem Acute hypoxic respiratory failure due to COVID-19 infection, aspiration pneumonia -Initially admitted to the ICU and was intubated.  Successfully extubated on 1/20, currently on room air.  Due to bilateral patchy infiltrates and recent fevers, elevated procalcitonin he completed 5 days of ceftriaxone and azithromycin. He initially tested positive for 1/19, given respiratory involvement will need to be isolated for 21 days.  Did not receive Remdesivir or steroids on admission   Active Problems Acute metabolic encephalopathy, unresponsive episode-there were initial concerns for acute meningitis/encephalitis.  He was initially on broad-spectrum antibiotics with vancomycin, Zosyn, acyclovir, underwent an LP which was negative.  Antibiotics were discontinued.  He underwent an EEG which showed diffuse encephalopathy without seizures, and UDS was negative.  He is at baseline  Elevated  troponin-2D echo showed EF 81%, grade 1 diastolic dysfunction.  Cardiology saw him and recommended for outpatient ischemic evaluation on discharge  Possible balanitis, candidal infection -Start clotrimazole 1% twice daily for 7 days  Chronic back pain -Brace  Acute kidney injury on chronic kidney disease stage IIIa-creatinine has improved and returned to baseline  Transaminitis, alcohol pattern, history of hep C-significant elevated liver enzymes on admission, concern for shock liver versus ischemic hepatitis versus metabolic hepatitis.  GI consulted and evaluated patient.  LFTs improving  Mild rhabdomyolysis-resolved with IV fluids  Diarrhea-C. difficile negative, possibly due to COVID-19  Microcytic anemia-suspect from underlying alcohol use.  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  He was on Coumadin in the past  History of dilated aorta-outpatient follow-up with cardiothoracic surgery  UTI with sepsis-on Ceftriaxone  Disposition: Stable to be discharged, however denies SNF and wants to go back to shelter.  She also has requirement for Covid to be negative, will retest pending  Scheduled Meds: . amLODipine  5 mg Oral Daily  . aspirin EC  81 mg Oral Daily  . Chlorhexidine Gluconate Cloth  6 each Topical Daily  . clotrimazole   Topical BID  . docusate sodium  100 mg Oral BID  . folic acid  1 mg Oral Daily  . heparin  5,000 Units Subcutaneous Q8H  . insulin aspart  0-9 Units Subcutaneous TID WC & HS  . metoprolol tartrate  25 mg Oral BID  . multivitamin with minerals  1 tablet Oral Daily  . pantoprazole  40 mg Oral QHS  . polyethylene glycol  17 g Oral Daily  . thiamine  100 mg Oral Daily   Continuous Infusions:  PRN Meds:.acetaminophen, albuterol, dextromethorphan-guaiFENesin, ondansetron (ZOFRAN) IV, polyethylene glycol  Diet Orders (  From admission, onward)    Start      Ordered   09/05/20 1413  DIET DYS 3 Room service appropriate? Yes; Fluid consistency: Thin  Diet effective now       Question Answer Comment  Room service appropriate? Yes   Fluid consistency: Thin      09/05/20 1412          DVT prophylaxis: heparin injection 5,000 Units Start: 09/03/20 2200 SCDs Start: 09/03/20 1821     Code Status: Full Code  Family Communication: no family at bedside   Status is: Inpatient  Remains inpatient appropriate because:Inpatient level of care appropriate due to severity of illness   Dispo: The patient is from: Home              Anticipated d/c is to: SNF              Anticipated d/c date is: 2 days              Patient currently is not medically stable to d/c.   Difficult to place patient No  Level of care: Med-Surg  Consultants:  none  Procedures:  None   Microbiology  None   Antimicrobials: Ceftriaxone / Azithromycin    Objective: Vitals:   09/18/20 2100 09/19/20 0809 09/19/20 2140 09/20/20 0610  BP: 132/87 127/66 122/87 (!) 135/95  Pulse: 80 80 80 78  Resp: 20 20 16 16   Temp: 99.3 F (37.4 C) 99.1 F (37.3 C) 99.2 F (37.3 C) 98.4 F (36.9 C)  TempSrc:  Oral    SpO2: 99% 92% 94% 97%  Weight:      Height:        Intake/Output Summary (Last 24 hours) at 09/20/2020 1028 Last data filed at 09/20/2020 0616 Gross per 24 hour  Intake --  Output 900 ml  Net -900 ml   Filed Weights   09/11/20 0500 09/12/20 0500 09/13/20 0500  Weight: 77.9 kg 81.4 kg 79.1 kg    Examination:  Constitutional: NAD, calm, comfortable Eyes: PERRL, lids and conjunctivae normal ENMT: Mucous membranes are moist.  Neck: normal, supple Respiratory: clear to auscultation bilaterally, no wheezing, no crackles.  Cardiovascular: Regular rate and rhythm, no murmurs / rubs / gallops. No LE edema. Abdomen: no tenderness. Bowel sounds positive.  Skin: no rashes GU: Slightly whitish discharge noted Neurologic: non focal   Data Reviewed: I have  independently reviewed following labs and imaging studies   CBC: Recent Labs  Lab 09/16/20 0244 09/17/20 0420 09/18/20 0838 09/19/20 0229  WBC 4.8 4.6 4.6 5.0  HGB 12.7* 12.1* 12.6* 11.7*  HCT 37.6* 35.2* 37.5* 35.0*  MCV 94.5 96.7 95.2 94.9  PLT 303 335 365 562   Basic Metabolic Panel: Recent Labs  Lab 09/16/20 0244 09/17/20 0420 09/18/20 0838 09/19/20 0229  NA 140 140 139 140  K 4.1 3.9 4.0 4.3  CL 109 110 110 110  CO2 17* 17* 20* 18*  GLUCOSE 96 120* 111* 98  BUN 19 15 10 10   CREATININE 1.72* 1.62* 1.30* 1.30*  CALCIUM 8.8* 8.9 9.1 9.3   Liver Function Tests: Recent Labs  Lab 09/16/20 0244 09/17/20 0420  AST 75* 49*  ALT 107* 80*  ALKPHOS 45 50  BILITOT 0.7 0.6  PROT 7.4 7.4  ALBUMIN 2.4* 2.3*   Coagulation Profile: No results for input(s): INR, PROTIME in the last 168 hours. HbA1C: No results for input(s): HGBA1C in the last 72 hours. CBG: Recent Labs  Lab 09/19/20 1142 09/19/20  1700 09/19/20 2007 09/20/20 0617 09/20/20 0742  GLUCAP 122* 105* 126* 90 94    Recent Results (from the past 240 hour(s))  Culture, blood (routine x 2)     Status: None   Collection Time: 09/15/20  2:26 PM   Specimen: BLOOD  Result Value Ref Range Status   Specimen Description BLOOD RIGHT ANTECUBITAL  Final   Special Requests   Final    BOTTLES DRAWN AEROBIC ONLY Blood Culture adequate volume   Culture   Final    NO GROWTH 5 DAYS Performed at [facility], [place] [address], [place], [place] [address]    Report Status 09/20/2020 FINAL  Final  Culture, blood (routine x 2)     Status: None   Collection Time: 09/15/20  2:56 PM   Specimen: BLOOD  Result Value Ref Range Status   Specimen Description BLOOD RIGHT ANTECUBITAL  Final   Special Requests   Final    BOTTLES DRAWN AEROBIC ONLY Blood Culture adequate volume   Culture   Final    NO GROWTH 5 DAYS Performed at [facility], [place] [address] [place], [place] [address]    Report Status 09/20/2020  FINAL  Final  Culture, Urine     Status: Abnormal   Collection Time: 09/15/20  4:25 PM   Specimen: Urine, Random  Result Value Ref Range Status   Specimen Description URINE, RANDOM  Final   Special Requests   Final    NONE Performed at [facility], [place] [address] [place], [place] [address]    Culture >=100,000 COLONIES/mL ESCHERICHIA COLI (A)  Final   Report Status 09/18/2020 FINAL  Final   Organism ID, Bacteria ESCHERICHIA COLI (A)  Final      Susceptibility   Escherichia coli - MIC*    AMPICILLIN >=32 RESISTANT Resistant     CEFAZOLIN <=4 SENSITIVE Sensitive     CEFEPIME <=0.12 SENSITIVE Sensitive     CEFTRIAXONE <=0.25 SENSITIVE Sensitive     CIPROFLOXACIN 0.5 SENSITIVE Sensitive     GENTAMICIN <=1 SENSITIVE Sensitive     IMIPENEM <=0.25 SENSITIVE Sensitive     NITROFURANTOIN <=16 SENSITIVE Sensitive     TRIMETH/SULFA >=320 RESISTANT Resistant     AMPICILLIN/SULBACTAM >=32 RESISTANT Resistant     PIP/TAZO <=4 SENSITIVE Sensitive     * >=100,000 COLONIES/mL ESCHERICHIA COLI     Radiology Studies: No results found.   Marzetta Board, MD, PhD [facility]  Between 7 am - 7 pm I am available, please contact me via Amion or Securechat  Between 7 pm - 7 am I am not available, please contact night coverage MD/APP via Amion

## 2020-09-20 NOTE — Progress Notes (Signed)
Orthopedic Tech Progress Note Patient Details:  Todd [name] 1955-09-02 [id number] Called in order to [facility]. Patient ID: [name]., male   DOB: 1956/06/01, 65 y.o.   MRN: [id number]   Chip Boer 09/20/2020, 12:28 PM

## 2020-09-20 NOTE — Plan of Care (Signed)
  Problem: Safety: Goal: Non-violent Restraint(s) Outcome: Progressing   

## 2020-09-21 DIAGNOSIS — J9601 Acute respiratory failure with hypoxia: Secondary | ICD-10-CM | POA: Diagnosis not present

## 2020-09-21 DIAGNOSIS — J9602 Acute respiratory failure with hypercapnia: Secondary | ICD-10-CM | POA: Diagnosis not present

## 2020-09-21 LAB — GLUCOSE, CAPILLARY
Glucose-Capillary: 102 mg/dL — ABNORMAL HIGH (ref 70–99)
Glucose-Capillary: 95 mg/dL (ref 70–99)
Glucose-Capillary: 141 mg/dL — ABNORMAL HIGH (ref 70–99)
Glucose-Capillary: 131 mg/dL — ABNORMAL HIGH (ref 70–99)

## 2020-09-21 NOTE — Progress Notes (Addendum)
PROGRESS NOTE  Todd [name]. JTT:[id number] DOB: 1956-01-13 DOA: 09/03/2020 PCP: [name], MD   LOS: 18 days   Brief Narrative / Interim history: 65 year old with history of DM2, HTN, CKD, alcohol/tobacco and heroin abuse presented from homeless shelter when he was found down and intubated in the ER. There was suspicion for seizure-like activity after intubation. Also found to have COVID-19. CT head showed bilateral chronic infarct, moderate cerebellar disease but nothing acute. EEG showed diffuse encephalopathy. LP was negative for meningitis. Initially received vancomycin, Zosyn and acyclovir which was later transitioned to Unasyn for aspiration pneumonia. Seen by cardiology due to elevated troponin. Also seen by GI due to severe transaminitis, currently advising to monitor. LFTs are improving. PT consulted.  Subjective / 24h Interval events: He is very upset this morning that his repeat Covid test came back positive, really concerned that was can happen to his bed at the [employer], his belongings, his wallet and so on.  He is left knee is bothering him more, this is been a chronic issue but feels worse now.  Assessment & Plan: Principal Problem Acute hypoxic respiratory failure due to COVID-19 infection, aspiration pneumonia -Initially admitted to the ICU and was intubated.  Successfully extubated on 1/20, currently on room air.  Due to bilateral patchy infiltrates and recent fevers, elevated procalcitonin he completed 5 days of ceftriaxone and azithromycin. He initially tested positive for 1/19, given respiratory involvement will need to be isolated for 21 days up until 2/9.  Did not receive Remdesivir or steroids on admission. On room air now  Active Problems Acute metabolic encephalopathy, unresponsive episode-there were initial concerns for acute meningitis/encephalitis.  He was initially on broad-spectrum antibiotics with vancomycin, Zosyn, acyclovir, underwent  an LP which was negative.  Antibiotics were discontinued.  He underwent an EEG which showed diffuse encephalopathy without seizures, and UDS was negative.  Mental status returned to baseline  Elevated troponin-2D echo showed EF 00%, grade 1 diastolic dysfunction.  Cardiology saw him and recommended for outpatient ischemic evaluation on discharge  Possible balanitis, candidal infection -Started clotrimazole 1% twice daily for 7 days on 2/5.  Improving today  Osteoarthritis, chronic back pain, chronic left knee pain -Patient got a brace for his back, will order a left knee sleeve today  Acute kidney injury on chronic kidney disease stage IIIa-creatinine at baseline, repeat tomorrow morning  Transaminitis, alcohol pattern, history of hep C-significant elevated liver enzymes on admission, concern for shock liver versus ischemic hepatitis versus metabolic hepatitis.  GI consulted and evaluated patient.  LFTs improving  Mild rhabdomyolysis-resolved with IV fluids  Diarrhea-C. difficile negative, possibly due to COVID-19  Microcytic anemia-suspect from underlying alcohol use.  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  He was on Coumadin in the past  History of dilated aorta-outpatient follow-up with cardiothoracic surgery  UTI with sepsis-on Ceftriaxone  Disposition: Stable to be discharged, however denies SNF and wants to go back to shelter.  Unfortunately his Covid came back positive so per my prior discussion with social worker he absolutely cannot return to shelter until he tests negative.  He doesn't want to go to SNF.  Scheduled Meds: . amLODipine  5 mg Oral Daily  . aspirin EC  81 mg Oral Daily  . Chlorhexidine Gluconate Cloth  6 each Topical Daily  . clotrimazole   Topical BID  . docusate sodium  100 mg Oral BID  . folic acid  1  mg Oral Daily  . heparin  5,000 Units Subcutaneous  Q8H  . insulin aspart  0-9 Units Subcutaneous TID WC & HS  . metoprolol tartrate  25 mg Oral BID  . multivitamin with minerals  1 tablet Oral Daily  . pantoprazole  40 mg Oral QHS  . polyethylene glycol  17 g Oral Daily  . thiamine  100 mg Oral Daily   Continuous Infusions:  PRN Meds:.acetaminophen, albuterol, dextromethorphan-guaiFENesin, ondansetron (ZOFRAN) IV, polyethylene glycol  Diet Orders (From admission, onward)    Start     Ordered   09/05/20 1413  DIET DYS 3 Room service appropriate? Yes; Fluid consistency: Thin  Diet effective now       Question Answer Comment  Room service appropriate? Yes   Fluid consistency: Thin      09/05/20 1412          DVT prophylaxis: heparin injection 5,000 Units Start: 09/03/20 2200 SCDs Start: 09/03/20 1821     Code Status: Full Code  Family Communication: no family at bedside   Status is: Inpatient  Remains inpatient appropriate because:Inpatient level of care appropriate due to severity of illness   Dispo: The patient is from: Home              Anticipated d/c is to: Shelter              Anticipated d/c date is: Unclear              Patient currently is medically stable to d/c.  But has no placement   Difficult to place patient Yes  Level of care: Med-Surg  Consultants:  none  Procedures:  None   Microbiology  None   Antimicrobials: Ceftriaxone / Azithromycin    Objective: Vitals:   09/19/20 2140 09/20/20 0610 09/20/20 2149 09/21/20 0632  BP: 122/87 (!) 135/95 119/88 (!) 121/98  Pulse: 80 78 79 75  Resp: 16 16 16 16   Temp: 99.2 F (37.3 C) 98.4 F (36.9 C) 98.4 F (36.9 C) 98.4 F (36.9 C)  TempSrc:      SpO2: 94% 97% 98% 94%  Weight:      Height:        Intake/Output Summary (Last 24 hours) at 09/21/2020 1041 Last data filed at 09/20/2020 2157 Gross per 24 hour  Intake 120 ml  Output 180 ml  Net -60 ml   Filed Weights   09/11/20 0500 09/12/20 0500 09/13/20 0500  Weight: 77.9 kg 81.4 kg 79.1 kg     Examination:  Constitutional: No distress Eyes: No scleral icterus ENMT: mmm.  Neck: normal, supple Respiratory: Clear bilaterally without wheezing or crackles Cardiovascular: Regular rate and rhythm, no murmurs, no edema Abdomen: Nontender, nondistended, bowel sounds positive Skin: No rashes seen MSK: Left knee mildly tender to palpation on the medial aspect, good range of motion, no significant swelling or erythema noted Neurologic: Nonfocal   Data Reviewed: I have independently reviewed following labs and imaging studies   CBC: Recent Labs  Lab 09/16/20 0244 09/17/20 0420 09/18/20 0838 09/19/20 0229  WBC 4.8 4.6 4.6 5.0  HGB 12.7* 12.1* 12.6* 11.7*  HCT 37.6* 35.2* 37.5* 35.0*  MCV 94.5 96.7 95.2 94.9  PLT 303 335 365 0000000   Basic Metabolic Panel: Recent Labs  Lab 09/16/20 0244 09/17/20 0420 09/18/20 0838 09/19/20 0229  NA 140 140 139 140  K 4.1 3.9 4.0 4.3  CL 109 110 110 110  CO2 17* 17* 20* 18*  GLUCOSE 96 120* 111*  98  BUN 19 15 10 10   CREATININE 1.72* 1.62* 1.30* 1.30*  CALCIUM 8.8* 8.9 9.1 9.3   Liver Function Tests: Recent Labs  Lab 09/16/20 0244 09/17/20 0420  AST 75* 49*  ALT 107* 80*  ALKPHOS 45 50  BILITOT 0.7 0.6  PROT 7.4 7.4  ALBUMIN 2.4* 2.3*   Coagulation Profile: No results for input(s): INR, PROTIME in the last 168 hours. HbA1C: No results for input(s): HGBA1C in the last 72 hours. CBG: Recent Labs  Lab 09/20/20 0742 09/20/20 1211 09/20/20 1648 09/20/20 2149 09/21/20 0808  GLUCAP 94 99 120* 93 102*    Recent Results (from the past 240 hour(s))  Culture, blood (routine x 2)     Status: None   Collection Time: 09/15/20  2:26 PM   Specimen: BLOOD  Result Value Ref Range Status   Specimen Description BLOOD RIGHT ANTECUBITAL  Final   Special Requests   Final    BOTTLES DRAWN AEROBIC ONLY Blood Culture adequate volume   Culture   Final    NO GROWTH 5 DAYS Performed at [facility], [place] [address]  [place], [place] [address]    Report Status 09/20/2020 FINAL  Final  Culture, blood (routine x 2)     Status: None   Collection Time: 09/15/20  2:56 PM   Specimen: BLOOD  Result Value Ref Range Status   Specimen Description BLOOD RIGHT ANTECUBITAL  Final   Special Requests   Final    BOTTLES DRAWN AEROBIC ONLY Blood Culture adequate volume   Culture   Final    NO GROWTH 5 DAYS Performed at [facility], [place] [address] [place], [place] [address]    Report Status 09/20/2020 FINAL  Final  Culture, Urine     Status: Abnormal   Collection Time: 09/15/20  4:25 PM   Specimen: Urine, Random  Result Value Ref Range Status   Specimen Description URINE, RANDOM  Final   Special Requests   Final    NONE Performed at [facility], [place] [address], [place], [place] [address]    Culture >=100,000 COLONIES/mL ESCHERICHIA COLI (A)  Final   Report Status 09/18/2020 FINAL  Final   Organism ID, Bacteria ESCHERICHIA COLI (A)  Final      Susceptibility   Escherichia coli - MIC*    AMPICILLIN >=32 RESISTANT Resistant     CEFAZOLIN <=4 SENSITIVE Sensitive     CEFEPIME <=0.12 SENSITIVE Sensitive     CEFTRIAXONE <=0.25 SENSITIVE Sensitive     CIPROFLOXACIN 0.5 SENSITIVE Sensitive     GENTAMICIN <=1 SENSITIVE Sensitive     IMIPENEM <=0.25 SENSITIVE Sensitive     NITROFURANTOIN <=16 SENSITIVE Sensitive     TRIMETH/SULFA >=320 RESISTANT Resistant     AMPICILLIN/SULBACTAM >=32 RESISTANT Resistant     PIP/TAZO <=4 SENSITIVE Sensitive     * >=100,000 COLONIES/mL ESCHERICHIA COLI  SARS CORONAVIRUS 2 (TAT 6-24 HRS) Nasopharyngeal Nasopharyngeal Swab     Status: Abnormal   Collection Time: 09/20/20 10:19 AM   Specimen: Nasopharyngeal Swab  Result Value Ref Range Status   SARS Coronavirus 2 POSITIVE (A) NEGATIVE Final    Comment: (NOTE) SARS-CoV-2 target nucleic acids are DETECTED.  The SARS-CoV-2 RNA is generally detectable in upper and lower respiratory specimens during the acute phase of  infection. Positive results are indicative of the presence of SARS-CoV-2 RNA. Clinical correlation with patient history and other diagnostic information is  necessary to determine patient infection status. Positive results do  not rule out bacterial infection or co-infection with other viruses.  The expected result is Negative.  Fact Sheet for Patients: SugarRoll.be  Fact Sheet for Healthcare Providers: https://www.woods-mathews.com/  This test is not yet approved or cleared by the [place] FDA and  has been authorized for detection and/or diagnosis of SARS-CoV-2 by FDA under an Emergency Use Authorization (EUA). This EUA will remain  in effect (meaning this test can be used) for the duration of the COVID-19 declaration under Section 564(b)(1) of the Act, 21 U. S.C. section 360bbb-3(b)(1), unless the authorization is terminated or revoked sooner.   Performed at [facility], [place] [address] [place], [place] [address]      Radiology Studies: No results found.   Marzetta Board, MD, PhD [facility]  Between 7 am - 7 pm I am available, please contact me via Amion or Securechat  Between 7 pm - 7 am I am not available, please contact night coverage MD/APP via Amion

## 2020-09-21 NOTE — Progress Notes (Addendum)
Occupational Therapy Treatment Patient Details Name: Todd [name]. MRN: [id number] DOB: 1956/06/14 Today's Date: 09/21/2020    History of present illness 65 year old with history of DM2, HTN, CKD, alcohol/tobacco and heroin abuse, Hepatitis C presented 09/03/20 from homeless shelter when he was found down. Intubated 1/19-1/20.  There was suspicion for seizure-like activity after intubation. UDS negative.   CT head showed bilateral chronic infarct, moderate cerebellar disease but nothing acute.  EEG showed diffuse encephalopathy.  LP was negative for meningitis; Also found to have COVID-19.+aspiration pneumonia;   OT comments  Pt. Seen for skilled OT session. Expresses concerns upon arrival with his living situation and concerns for his belongings. Provided support. Pt. Agreeable to oob as he needed to use the b.room. mood shifted with use of gait belt. This triggered notable agitation and pt. Began to yell that "I know youre getting something from this, youre getting enjoyment from this belt why do I have to wear a belt just to take a sh#!".  Asked pt. If I was able to explain to him about the belt use. He nodded yes.  I reviewed that it was a rule to use for his safety as well as mine. I assured him we use the belts on everyone not just him.   He was able to calm and agreed to allow the belt use. Ambulated to/from b.room min guard a.  Seated eob at end of session.  Apologetic for his outburst and explained again he was concerned about his belongings and changes in living situations.  Cont. To provide reassurance.  Pt. In good spirits at end of session.  Follow Up Recommendations  SNF;Supervision/Assistance - 24 hour    Equipment Recommendations       Recommendations for Other Services      Precautions / Restrictions Precautions Precautions: Fall       Mobility Bed Mobility Overal bed mobility: Needs Assistance Bed Mobility: Supine to Sit     Supine to sit: Supervision     General  bed mobility comments: bed flat; no physical assist needed,  Transfers Overall transfer level: Needs assistance Equipment used: Rolling walker (2 wheeled) Transfers: Sit to/from [facility] Sit to Stand: Supervision Stand pivot transfers: Min guard            Balance                                           ADL either performed or assessed with clinical judgement   ADL Overall ADL's : Needs assistance/impaired  LB dressing: set up seated, don socks                         Toilet Transfer: Min guard;Ambulation;RW   Toileting- Water quality scientist and Hygiene: Min guard;Sit to/from stand       Functional mobility during ADLs: Min guard General ADL Comments: pt. did well with rw management able to perform peri care with no lob noted     Vision       Perception     Praxis      Cognition Arousal/Alertness: Awake/alert Behavior During Therapy: Agitated (sad)                                   General Comments: fluctuations between extreme agitation, sadness,  and regular communication        Exercises     Shoulder Instructions       General Comments      Pertinent Vitals/ Pain       Pain Assessment: No/denies pain  Home Living                                          Prior Functioning/Environment              Frequency  Min 2X/week        Progress Toward Goals  OT Goals(current goals can now be found in the care plan section)  Progress towards OT goals: Progressing toward goals     Plan Discharge plan remains appropriate    Co-evaluation                 AM-PAC OT "6 Clicks" Daily Activity     Outcome Measure   Help from another person eating meals?: A Little Help from another person taking care of personal grooming?: A Little Help from another person toileting, which includes using toliet, bedpan, or urinal?: Total Help from another person  bathing (including washing, rinsing, drying)?: A Lot Help from another person to put on and taking off regular upper body clothing?: A Lot Help from another person to put on and taking off regular lower body clothing?: A Lot 6 Click Score: 13    End of Session Equipment Utilized During Treatment: Gait belt;Rolling walker  OT Visit Diagnosis: Unsteadiness on feet (R26.81);Other abnormalities of gait and mobility (R26.89);Muscle weakness (generalized) (M62.81);Other symptoms and signs involving cognitive function   Activity Tolerance Patient tolerated treatment well   Patient Left in bed;with call bell/phone within reach   Nurse Communication Other (comment) (pt. requesting apple juice rn states okay for pt. to have)        Time: 8657-8469 OT Time Calculation (min): 23 min  Charges: OT General Charges $OT Visit: 1 Visit OT Treatments $Self Care/Home Management : 23-37 mins  Sonia Baller, COTA/L Acute Rehabilitation [phone]   Janice Coffin 09/21/2020, 11:21 AM

## 2020-09-22 DIAGNOSIS — J9601 Acute respiratory failure with hypoxia: Secondary | ICD-10-CM | POA: Diagnosis not present

## 2020-09-22 DIAGNOSIS — J9602 Acute respiratory failure with hypercapnia: Secondary | ICD-10-CM | POA: Diagnosis not present

## 2020-09-22 LAB — COMPREHENSIVE METABOLIC PANEL
ALT: 46 U/L — ABNORMAL HIGH (ref 0–44)
AST: 39 U/L (ref 15–41)
Albumin: 2.3 g/dL — ABNORMAL LOW (ref 3.5–5.0)
Alkaline Phosphatase: 47 U/L (ref 38–126)
Anion gap: 9 (ref 5–15)
BUN: 8 mg/dL (ref 8–23)
CO2: 20 mmol/L — ABNORMAL LOW (ref 22–32)
Calcium: 9.3 mg/dL (ref 8.9–10.3)
Chloride: 111 mmol/L (ref 98–111)
Creatinine, Ser: 1.34 mg/dL — ABNORMAL HIGH (ref 0.61–1.24)
GFR, Estimated: 59 mL/min — ABNORMAL LOW (ref >60)
Glucose, Bld: 107 mg/dL — ABNORMAL HIGH (ref 70–99)
Potassium: 4.2 mmol/L (ref 3.5–5.1)
Sodium: 140 mmol/L (ref 135–145)
Total Bilirubin: 0.6 mg/dL (ref 0.3–1.2)
Total Protein: 7.2 g/dL (ref 6.5–8.1)

## 2020-09-22 LAB — CBC
HCT: 37.2 % — ABNORMAL LOW (ref 39.0–52.0)
Hemoglobin: 11.7 g/dL — ABNORMAL LOW (ref 13.0–17.0)
MCH: 30.2 pg (ref 26.0–34.0)
MCHC: 31.5 g/dL (ref 30.0–36.0)
MCV: 95.9 fL (ref 80.0–100.0)
Platelets: 394 10*3/uL (ref 150–400)
RBC: 3.88 MIL/uL — ABNORMAL LOW (ref 4.22–5.81)
RDW: 14.4 % (ref 11.5–15.5)
WBC: 4.8 10*3/uL (ref 4.0–10.5)
nRBC: 0 % (ref 0.0–0.2)

## 2020-09-22 LAB — GLUCOSE, CAPILLARY
Glucose-Capillary: 101 mg/dL — ABNORMAL HIGH (ref 70–99)
Glucose-Capillary: 118 mg/dL — ABNORMAL HIGH (ref 70–99)
Glucose-Capillary: 120 mg/dL — ABNORMAL HIGH (ref 70–99)
Glucose-Capillary: 117 mg/dL — ABNORMAL HIGH (ref 70–99)

## 2020-09-22 MED ORDER — TRAMADOL HCL 50 MG PO TABS
50.0000 mg | ORAL_TABLET | Freq: Four times a day (QID) | ORAL | Status: DC | PRN
  Administered 2020-09-22 – 2020-09-25 (×3): 50 mg via ORAL
  Filled 2020-09-22 (×3): qty 1

## 2020-09-22 NOTE — Progress Notes (Signed)
PROGRESS NOTE  Todd [name]. WNU:[id number] DOB: 11-Nov-1955 DOA: 09/03/2020 PCP: [name], MD   LOS: 19 days   Brief Narrative / Interim history: 65 year old with history of DM2, HTN, CKD, alcohol/tobacco and heroin abuse presented from homeless shelter when he was found down and intubated in the ER. There was suspicion for seizure-like activity after intubation. Also found to have COVID-19. CT head showed bilateral chronic infarct, moderate cerebellar disease but nothing acute. EEG showed diffuse encephalopathy. LP was negative for meningitis. Initially received vancomycin, Zosyn and acyclovir which was later transitioned to Unasyn for aspiration pneumonia. Seen by cardiology due to elevated troponin. Also seen by GI due to severe transaminitis, currently advising to monitor. LFTs are improving. PT consulted.  Subjective / 24h Interval events: Remains upset that his repeat Covid test came back positive.  Really wants to go to [employer] but understands that they will not take him  Assessment & Plan: Principal Problem Acute hypoxic respiratory failure due to COVID-19 infection, aspiration pneumonia -Initially admitted to the ICU and was intubated.  Successfully extubated on 1/20, currently on room air.  Due to bilateral patchy infiltrates and recent fevers, elevated procalcitonin he completed 5 days of ceftriaxone and azithromycin. He initially tested positive for 1/19, given respiratory involvement will need to be isolated for 21 days up until 2/9.  Did not receive Remdesivir or steroids on admission.  -Remains on room air and respiratory status is stable  Active Problems Acute metabolic encephalopathy, unresponsive episode-there were initial concerns for acute meningitis/encephalitis.  He was initially on broad-spectrum antibiotics with vancomycin, Zosyn, acyclovir, underwent an LP which was negative.  Antibiotics were discontinued.  He underwent an EEG which showed  diffuse encephalopathy without seizures, and UDS was negative.  Mental status returned to baseline  Elevated troponin-2D echo showed EF 03%, grade 1 diastolic dysfunction.  Cardiology saw him and recommended for outpatient ischemic evaluation on discharge  Possible balanitis, candidal infection -Started clotrimazole 1% twice daily for 7 days on 2/5.  Improving  Osteoarthritis, chronic back pain, chronic left knee pain -Patient got a brace for his back, left knee sleeve  Acute kidney injury on chronic kidney disease stage IIIa-creatinine at baseline this morning  Transaminitis, alcohol pattern, history of hep C-significant elevated liver enzymes on admission, concern for shock liver versus ischemic hepatitis versus metabolic hepatitis.  GI consulted and evaluated patient.  LFTs almost normalized today  Mild rhabdomyolysis-resolved with IV fluids  Diarrhea-C. difficile negative, possibly due to COVID-19  Microcytic anemia-suspect from underlying alcohol use.  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  He was on Coumadin in the past  History of dilated aorta-outpatient follow-up with cardiothoracic surgery  UTI with sepsis-on Ceftriaxone  Disposition: Stable to be discharged, however denies SNF and wants to go back to shelter.  Unfortunately his Covid came back positive so per my prior discussion with social worker he absolutely cannot return to shelter until he tests negative.  He doesn't want to go to SNF.  Scheduled Meds: . amLODipine  5 mg Oral Daily  . aspirin EC  81 mg Oral Daily  . Chlorhexidine Gluconate Cloth  6 each Topical Daily  . clotrimazole   Topical BID  . docusate sodium  100 mg Oral BID  . folic acid  1 mg Oral Daily  . heparin  5,000 Units Subcutaneous Q8H  . insulin aspart  0-9 Units Subcutaneous TID WC & HS  .  metoprolol tartrate  25 mg Oral BID  . multivitamin  with minerals  1 tablet Oral Daily  . pantoprazole  40 mg Oral QHS  . polyethylene glycol  17 g Oral Daily  . thiamine  100 mg Oral Daily   Continuous Infusions:  PRN Meds:.acetaminophen, albuterol, dextromethorphan-guaiFENesin, ondansetron (ZOFRAN) IV, polyethylene glycol  Diet Orders (From admission, onward)    Start     Ordered   09/05/20 1413  DIET DYS 3 Room service appropriate? Yes; Fluid consistency: Thin  Diet effective now       Question Answer Comment  Room service appropriate? Yes   Fluid consistency: Thin      09/05/20 1412          DVT prophylaxis: heparin injection 5,000 Units Start: 09/03/20 2200 SCDs Start: 09/03/20 1821     Code Status: Full Code  Family Communication: no family at bedside   Status is: Inpatient  Remains inpatient appropriate because:Inpatient level of care appropriate due to severity of illness   Dispo: The patient is from: Home              Anticipated d/c is to: Shelter              Anticipated d/c date is: Unclear              Patient currently is medically stable to d/c.  But has no placement   Difficult to place patient Yes  Level of care: Med-Surg  Consultants:  none  Procedures:  None   Microbiology  None   Antimicrobials: Ceftriaxone / Azithromycin    Objective: Vitals:   09/21/20 0800 09/21/20 1607 09/21/20 2100 09/22/20 0500  BP:  129/79 109/79   Pulse:  73 75   Resp: 18 20 18    Temp:  98.1 F (36.7 C) 97.8 F (36.6 C)   TempSrc:   Axillary   SpO2:  95% 97%   Weight:    78.2 kg  Height:        Intake/Output Summary (Last 24 hours) at 09/22/2020 0958 Last data filed at 09/21/2020 1815 Gross per 24 hour  Intake --  Output 300 ml  Net -300 ml   Filed Weights   09/12/20 0500 09/13/20 0500 09/22/20 0500  Weight: 81.4 kg 79.1 kg 78.2 kg    Examination:  Constitutional: No distress Eyes: No icterus ENMT: mmm.  Neck: normal, supple Respiratory: Clear bilaterally, no wheezing, no  crackles Cardiovascular: Regular rate and rhythm, no murmurs, no edema Abdomen: Soft, nontender, nondistended, bowel sounds positive Skin: No rashes MSK: Left knee mildly tender to palpation on the medial aspect, good range of motion, no significant swelling or erythema noted, no new findings today Neurologic: No focal deficits   Data Reviewed: I have independently reviewed following labs and imaging studies   CBC: Recent Labs  Lab 09/16/20 0244 09/17/20 0420 09/18/20 0838 09/19/20 0229 09/22/20 0150  WBC 4.8 4.6 4.6 5.0 4.8  HGB 12.7* 12.1* 12.6* 11.7* 11.7*  HCT 37.6* 35.2* 37.5* 35.0* 37.2*  MCV 94.5 96.7 95.2 94.9 95.9  PLT 303 335 365 349 350   Basic Metabolic Panel: Recent Labs  Lab 09/16/20 0244 09/17/20 0420 09/18/20 0838 09/19/20 0229 09/22/20 0150  NA 140 140 139 140 140  K 4.1 3.9 4.0 4.3 4.2  CL 109 110 110 110 111  CO2 17* 17* 20* 18* 20*  GLUCOSE 96 120* 111* 98 107*  BUN 19 15 10 10 8   CREATININE 1.72* 1.62* 1.30* 1.30*  1.34*  CALCIUM 8.8* 8.9 9.1 9.3 9.3   Liver Function Tests: Recent Labs  Lab 09/16/20 0244 09/17/20 0420 09/22/20 0150  AST 75* 49* 39  ALT 107* 80* 46*  ALKPHOS 45 50 47  BILITOT 0.7 0.6 0.6  PROT 7.4 7.4 7.2  ALBUMIN 2.4* 2.3* 2.3*   Coagulation Profile: No results for input(s): INR, PROTIME in the last 168 hours. HbA1C: No results for input(s): HGBA1C in the last 72 hours. CBG: Recent Labs  Lab 09/21/20 0808 09/21/20 1154 09/21/20 1608 09/21/20 2123 09/22/20 0802  GLUCAP 102* 95 141* 131* 101*    Recent Results (from the past 240 hour(s))  Culture, blood (routine x 2)     Status: None   Collection Time: 09/15/20  2:26 PM   Specimen: BLOOD  Result Value Ref Range Status   Specimen Description BLOOD RIGHT ANTECUBITAL  Final   Special Requests   Final    BOTTLES DRAWN AEROBIC ONLY Blood Culture adequate volume   Culture   Final    NO GROWTH 5 DAYS Performed at [facility], [place] [address],  [place], [place] [address]    Report Status 09/20/2020 FINAL  Final  Culture, blood (routine x 2)     Status: None   Collection Time: 09/15/20  2:56 PM   Specimen: BLOOD  Result Value Ref Range Status   Specimen Description BLOOD RIGHT ANTECUBITAL  Final   Special Requests   Final    BOTTLES DRAWN AEROBIC ONLY Blood Culture adequate volume   Culture   Final    NO GROWTH 5 DAYS Performed at [facility], [place] [address], [place], [place] [address]    Report Status 09/20/2020 FINAL  Final  Culture, Urine     Status: Abnormal   Collection Time: 09/15/20  4:25 PM   Specimen: Urine, Random  Result Value Ref Range Status   Specimen Description URINE, RANDOM  Final   Special Requests   Final    NONE Performed at [facility], [place] [address], [place], [place] [address]    Culture >=100,000 COLONIES/mL ESCHERICHIA COLI (A)  Final   Report Status 09/18/2020 FINAL  Final   Organism ID, Bacteria ESCHERICHIA COLI (A)  Final      Susceptibility   Escherichia coli - MIC*    AMPICILLIN >=32 RESISTANT Resistant     CEFAZOLIN <=4 SENSITIVE Sensitive     CEFEPIME <=0.12 SENSITIVE Sensitive     CEFTRIAXONE <=0.25 SENSITIVE Sensitive     CIPROFLOXACIN 0.5 SENSITIVE Sensitive     GENTAMICIN <=1 SENSITIVE Sensitive     IMIPENEM <=0.25 SENSITIVE Sensitive     NITROFURANTOIN <=16 SENSITIVE Sensitive     TRIMETH/SULFA >=320 RESISTANT Resistant     AMPICILLIN/SULBACTAM >=32 RESISTANT Resistant     PIP/TAZO <=4 SENSITIVE Sensitive     * >=100,000 COLONIES/mL ESCHERICHIA COLI  SARS CORONAVIRUS 2 (TAT 6-24 HRS) Nasopharyngeal Nasopharyngeal Swab     Status: Abnormal   Collection Time: 09/20/20 10:19 AM   Specimen: Nasopharyngeal Swab  Result Value Ref Range Status   SARS Coronavirus 2 POSITIVE (A) NEGATIVE Final    Comment: (NOTE) SARS-CoV-2 target nucleic acids are DETECTED.  The SARS-CoV-2 RNA is generally detectable in upper and lower respiratory specimens during the acute phase of  infection. Positive results are indicative of the presence of SARS-CoV-2 RNA. Clinical correlation with patient history and other diagnostic information is  necessary to determine patient infection status. Positive results do not rule out  bacterial infection or co-infection with other viruses.  The expected result is Negative.  Fact Sheet for Patients: SugarRoll.be  Fact Sheet for Healthcare Providers: https://www.woods-mathews.com/  This test is not yet approved or cleared by the [place] FDA and  has been authorized for detection and/or diagnosis of SARS-CoV-2 by FDA under an Emergency Use Authorization (EUA). This EUA will remain  in effect (meaning this test can be used) for the duration of the COVID-19 declaration under Section 564(b)(1) of the Act, 21 U. S.C. section 360bbb-3(b)(1), unless the authorization is terminated or revoked sooner.   Performed at [name], [place] [address] [place], [place] [address]      Radiology Studies: No results found.   Todd Board, MD, PhD [facility]  Between 7 am - 7 pm I am available, please contact me via Amion or Securechat  Between 7 pm - 7 am I am not available, please contact night coverage MD/APP via Amion

## 2020-09-22 NOTE — Progress Notes (Signed)
OT Cancellation Note  Patient Details Name: Todd [name]. MRN: [id number] DOB: August 30, 1955   Cancelled Treatment:    Reason Eval/Treat Not Completed: Patient declined, no reason specified. Brought Rollator to pt room to engage in ADLs/OOB activities. Pt reports he wants to talk to case manager, also wants apple juice. Attempted to engage pt in mobility to get apple juice but pt adamantly declined and reports he is in a bad mood today. Requests OT to come back later.   Layla Maw 09/22/2020, 10:47 AM

## 2020-09-22 NOTE — TOC Progression Note (Signed)
Transition of Care (TOC) - Progression Note    Patient Details  Name: Todd [name]. MRN: [id number] Date of Birth: Sep 11, 1955  Transition of Care [facility]) CM/SW Contact  Angelita Ingles, RN Phone Number: [phone]  09/22/2020, 9:26 AM  Clinical Narrative:    Patients covid test on 2/5 remains positive. Per [employer] patient can not come back to the shelter with a positive covid test. CM has left message for [facility].    Expected Discharge Plan: Homeless Shelter Barriers to Discharge: Active Substance Use - Placement,ED Unsafe disposition,Homeless with medical needs  Expected Discharge Plan and Services Expected Discharge Plan: Homeless Shelter In-house Referral: NA Discharge Planning Services: CM Consult   Living arrangements for the past 2 months: Homeless                 DME Arranged: N/A                     Social Determinants of Health (SDOH) Interventions    Readmission Risk Interventions Readmission Risk Prevention Plan 09/16/2020  Transportation Screening Complete  PCP or Specialist Appt within 5-7 Days Complete  Home Care Screening Complete  Medication Review (RN CM) Referral to Pharmacy  Some recent data might be hidden

## 2020-09-23 DIAGNOSIS — J9602 Acute respiratory failure with hypercapnia: Secondary | ICD-10-CM | POA: Diagnosis not present

## 2020-09-23 DIAGNOSIS — J9601 Acute respiratory failure with hypoxia: Secondary | ICD-10-CM | POA: Diagnosis not present

## 2020-09-23 LAB — GLUCOSE, CAPILLARY
Glucose-Capillary: 95 mg/dL (ref 70–99)
Glucose-Capillary: 106 mg/dL — ABNORMAL HIGH (ref 70–99)
Glucose-Capillary: 93 mg/dL (ref 70–99)
Glucose-Capillary: 101 mg/dL — ABNORMAL HIGH (ref 70–99)

## 2020-09-23 NOTE — TOC Progression Note (Addendum)
Transition of Care (TOC) - Progression Note    Patient Details  Name: [name]. MRN: [id number] Date of Birth: Oct 28, 1955  Transition of Care [facility]) CM/SW [place], RN Phone Number: [phone]  09/23/2020, 9:34 AM  Clinical Narrative:    CM spoke with Denton Ar at [facility]. Denton Ar confirms that the [facility] will not be able to accept patient back due to being covid positive. Facility does not make any exceptions even for patients that are off quarantine. Denton Ar will bring patient belongings to the hospital. The belongings will be delivered to the main entrance and staff will be notified when belongings are delivered. CM attempted to update patient but patient doesn't answer the phone. TOC will continue to follow for disposition needs.  1100 CM spoke with patient to update him about not being eligible to return to [facility] and made him aware that his belongings will be delivered to hospital today. CM asked patient is he is willing to have his info faxed out to facilities for rehab. Patient is agreeable. SNF workup initiated.     1400 Patient belongings delivered to hospital per Denton Ar at [facility]. Belonging have been delivered to the patients room per staff. CM has made numerous attempts to call patient to update him but patient does not answer the phone.   1445 FL2 complete PASRR [id number]. Patient info has been faxed out for bed request. Insurance Josem Kaufmann has not been initiated due to [employer]. Facility must initiate auth.   Expected Discharge Plan: Homeless Shelter Barriers to Discharge: Active Substance Use - Placement,ED Unsafe disposition,Homeless with medical needs  Expected Discharge Plan and Services Expected Discharge Plan: Homeless Shelter In-house Referral: NA Discharge Planning Services: CM Consult   Living arrangements for the past 2 months: Homeless                 DME Arranged: N/A                      Social Determinants of Health (SDOH) Interventions    Readmission Risk Interventions Readmission Risk Prevention Plan 09/16/2020  Transportation Screening Complete  PCP or Specialist Appt within 5-7 Days Complete  Home Care Screening Complete  Medication Review (RN CM) Referral to Pharmacy  Some recent data might be hidden

## 2020-09-23 NOTE — Progress Notes (Signed)
PT Cancellation Note  Patient Details Name: Todd [name]. MRN: [id number] DOB: 1956-03-14   Cancelled Treatment:    Reason Eval/Treat Not Completed: Patient declined, no reason specified.  Pt refusing to mobilize with PT.  Reports he is preoccupied with "losing my spot at [employer]" re his current housing situation.   Thanks,  Verdene Lennert, PT, DPT  Acute Rehabilitation [phone] pager #[phone] office       [name] 09/23/2020, 11:18 AM

## 2020-09-23 NOTE — NC FL2 (Signed)
[facility] LEVEL OF CARE SCREENING TOOL     IDENTIFICATION  Patient Name: Todd [name]. Birthdate: 09/02/1955 Sex: male Admission Date (Current Location): 09/03/2020  [facility] and [place] Number:  Herbalist and Address:  [facility], [employer] [address], [place], [place] [address]      Provider Number:    Attending Physician Name and Address:  Caren Griffins, MD  Relative Name and Phone Number:  Gar Ponto    [phone]    Current Level of Care: Hospital Recommended Level of Care: [place] Prior Approval Number:    Date Approved/Denied:   PASRR Number: [id number]  Discharge Plan: SNF    Current Diagnoses: Patient Active Problem List   Diagnosis Date Noted  . Acute respiratory failure with hypoxia ([place]) 09/03/2020  . 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 AMS (altered mental status) 04/03/2019  . Acute kidney failure ([place]) 01/09/2019  . Pulmonary embolus and infarction ([place]) 07/13/2018  . Opioid use disorder, moderate, dependence ([place]) 06/29/2018  . Alcohol dependence ([place]) 06/27/2018  . MDD (major depressive disorder), recurrent severe, without psychosis ([place]) 06/26/2018  . Varicose veins of left lower extremity with complications 43/32/9518  . Benign paroxysmal positional vertigo 11/08/2013  . RBBB 06/02/2009  . 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 DM (diabetes mellitus), type 2 ([place]) 04/11/2009  . OBESITY 04/11/2009  . DEPRESSION 04/11/2009  . Essential hypertension 04/11/2009  . DIVERTICULAR DISEASE 04/11/2009  . CHOLECYSTITIS 04/11/2009  . CHEST PAIN 04/11/2009  . ABDOMINAL PAIN 04/11/2009    Orientation RESPIRATION BLADDER Height & Weight     Self,Time,Situation,Place  Normal Continent Weight: 81.3 kg Height:  5\' 6"  (167.6 cm)  BEHAVIORAL SYMPTOMS/MOOD NEUROLOGICAL BOWEL NUTRITION STATUS     (One episode of seizure like activity) Continent Diet  AMBULATORY STATUS COMMUNICATION  OF NEEDS Skin   Limited Assist (rolling walker/ supervision) Verbally Normal                       Personal Care Assistance Level of Assistance  Bathing,Feeding,Dressing,Total care Bathing Assistance: Limited assistance Feeding assistance: Limited assistance (set up only) Dressing Assistance: Limited assistance Total Care Assistance: Limited assistance   Functional Limitations Info  Sight,Hearing,Speech Sight Info: Adequate Hearing Info: Adequate Speech Info: Adequate    SPECIAL CARE FACTORS FREQUENCY  PT (By licensed PT),OT (By licensed OT)     PT Frequency: 5X OT Frequency: 5X            Contractures Contractures Info: Not present    Additional Factors Info  Code Status,Allergies,Psychotropic,Insulin Sliding Scale,Isolation Precautions,Suctioning Needs Code Status Info: Full Allergies Info: no known allergies Psychotropic Info: None Insulin Sliding Scale Info: see d/c summary for sliding scale info Isolation Precautions Info: Contact/ airborne   Covid positive but isolation ends 2/9 then patient will be covid recovery Suctioning Needs: none   Current Medications (09/23/2020):  This is the current hospital active medication list Current Facility-Administered Medications  Medication Dose Route Frequency Provider Last Rate Last Admin  . acetaminophen (TYLENOL) tablet 650 mg  650 mg Oral Q8H PRN Damita Lack, MD   650 mg at 09/18/20 2106  . albuterol (PROVENTIL) (2.5 MG/3ML) 0.083% nebulizer solution 2.5 mg  2.5 mg Nebulization Q2H PRN Bowser, Laurel Dimmer, NP      . amLODipine (NORVASC) tablet 5 mg  5 mg Oral Daily Isaiah Serge, NP   5 mg at 09/23/20 0906  . aspirin EC tablet 81 mg  81 mg Oral Daily Arnoldo Lenis, MD   81 mg at 09/23/20 1610  . Chlorhexidine Gluconate Cloth 2 % PADS 6 each  6 each Topical Daily Candee Furbish, MD   6 each at 09/23/20 [phone]  . clotrimazole (LOTRIMIN) 1 % cream   Topical BID Caren Griffins, MD   Given at 09/23/20 [phone]  .  dextromethorphan-guaiFENesin (MUCINEX DM) 30-600 MG per 12 hr tablet 1 tablet  1 tablet Oral BID PRN Amin, Ankit Chirag, MD      . docusate sodium (COLACE) capsule 100 mg  100 mg Oral BID Donne Hazel, MD   100 mg at 09/23/20 0906  . folic acid (FOLVITE) tablet 1 mg  1 mg Oral Daily Donne Hazel, MD   1 mg at 09/23/20 0906  . heparin injection 5,000 Units  5,000 Units Subcutaneous Q8H Bowser, Laurel Dimmer, NP   5,000 Units at 09/23/20 1346  . insulin aspart (novoLOG) injection 0-9 Units  0-9 Units Subcutaneous TID WC & HS Damita Lack, MD   1 Units at 09/21/20 2128  . metoprolol tartrate (LOPRESSOR) tablet 25 mg  25 mg Oral BID Isaiah Serge, NP   25 mg at 09/23/20 8119  . multivitamin with minerals tablet 1 tablet  1 tablet Oral Daily Donne Hazel, MD   1 tablet at 09/23/20 [phone]  . ondansetron (ZOFRAN) injection 4 mg  4 mg Intravenous Q6H PRN Bowser, Laurel Dimmer, NP      . pantoprazole (PROTONIX) EC tablet 40 mg  40 mg Oral QHS Donne Hazel, MD   40 mg at 09/22/20 2205  . polyethylene glycol (MIRALAX / GLYCOLAX) packet 17 g  17 g Oral Daily Donne Hazel, MD   17 g at 09/16/20 0859  . polyethylene glycol (MIRALAX / GLYCOLAX) packet 17 g  17 g Oral Daily PRN Donne Hazel, MD      . thiamine tablet 100 mg  100 mg Oral Daily Donne Hazel, MD   100 mg at 09/23/20 0906  . traMADol (ULTRAM) tablet 50 mg  50 mg Oral Q6H PRN Caren Griffins, MD   50 mg at 09/22/20 1334     Discharge Medications: Please see discharge summary for a list of discharge medications.  Relevant Imaging Results:  Relevant Lab Results:   Additional Information SS# [id number]  Angelita Ingles, RN

## 2020-09-23 NOTE — Progress Notes (Signed)
PROGRESS NOTE  Todd [name]. IWL:[id number] DOB: 1955/08/20 DOA: 09/03/2020 PCP: [name], MD   LOS: 20 days   Brief Narrative / Interim history: 65 year old with history of DM2, HTN, CKD, alcohol/tobacco and heroin abuse presented from homeless shelter when he was found down and intubated in the ER. There was suspicion for seizure-like activity after intubation. Also found to have COVID-19. CT head showed bilateral chronic infarct, moderate cerebellar disease but nothing acute. EEG showed diffuse encephalopathy. LP was negative for meningitis. Initially received vancomycin, Zosyn and acyclovir which was later transitioned to Unasyn for aspiration pneumonia. Seen by cardiology due to elevated troponin. Also seen by GI due to severe transaminitis, currently advising to monitor. LFTs are improving. PT consulted.  Subjective / 24h Interval events: Upset that his repeat Covid test came back positive. No other complaints but wondering where to go. He is also wondering about his belongings  Assessment & Plan: Principal Problem Acute hypoxic respiratory failure due to COVID-19 infection, aspiration pneumonia -Initially admitted to the ICU and was intubated.  Successfully extubated on 1/20, currently on room air.  Due to bilateral patchy infiltrates and recent fevers, elevated procalcitonin he completed 5 days of ceftriaxone and azithromycin. He initially tested positive for 1/19, given respiratory involvement will need to be isolated for 21 days up until 2/9.  Did not receive Remdesivir or steroids on admission.  -Remains on room air and respiratory status is stable  Active Problems Acute metabolic encephalopathy, unresponsive episode-there were initial concerns for acute meningitis/encephalitis.  He was initially on broad-spectrum antibiotics with vancomycin, Zosyn, acyclovir, underwent an LP which was negative.  Antibiotics were discontinued.  He underwent an EEG which showed  diffuse encephalopathy without seizures, and UDS was negative.  Mental status returned to baseline  Elevated troponin-2D echo showed EF 17%, grade 1 diastolic dysfunction.  Cardiology saw him and recommended for outpatient ischemic evaluation on discharge  Possible balanitis, candidal infection -Started clotrimazole 1% twice daily for 7 days on 2/5.  Improving  Osteoarthritis, chronic back pain, chronic left knee pain -Patient got a brace for his back, left knee sleeve  Acute kidney injury on chronic kidney disease stage IIIa-creatinine at baseline   Transaminitis, alcohol pattern, history of hep C-significant elevated liver enzymes on admission, concern for shock liver versus ischemic hepatitis versus metabolic hepatitis.  GI consulted and evaluated patient.  LFTs almost normalized today  Mild rhabdomyolysis-resolved with IV fluids  Diarrhea-C. difficile negative, possibly due to COVID-19  Microcytic anemia-suspect from underlying alcohol use.  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  He was on Coumadin in the past  History of dilated aorta-outpatient follow-up with cardiothoracic surgery  UTI with sepsis-on Ceftriaxone  Disposition-Stable to be discharged, however does not want SNF and wants to go back to [facility]. Unfortunately [employer] has an unreasonable request for patient to test negative prior to his return. He just tested positive again on 2/5. They are not going to hold his bed anymore, and per SW they will bring his belongings to the hospital today to be given to the patient  Scheduled Meds: . amLODipine  5 mg Oral Daily  . aspirin EC  81 mg Oral Daily  . Chlorhexidine Gluconate Cloth  6 each Topical Daily  . clotrimazole   Topical BID  . docusate sodium  100 mg Oral BID  . folic acid  1 mg Oral Daily  . heparin  5,000  Units Subcutaneous Q8H  .  insulin aspart  0-9 Units Subcutaneous TID WC & HS  . metoprolol tartrate  25 mg Oral BID  . multivitamin with minerals  1 tablet Oral Daily  . pantoprazole  40 mg Oral QHS  . polyethylene glycol  17 g Oral Daily  . thiamine  100 mg Oral Daily   Continuous Infusions:  PRN Meds:.acetaminophen, albuterol, dextromethorphan-guaiFENesin, ondansetron (ZOFRAN) IV, polyethylene glycol, traMADol  Diet Orders (From admission, onward)    Start     Ordered   09/05/20 1413  DIET DYS 3 Room service appropriate? Yes; Fluid consistency: Thin  Diet effective now       Question Answer Comment  Room service appropriate? Yes   Fluid consistency: Thin      09/05/20 1412          DVT prophylaxis: heparin injection 5,000 Units Start: 09/03/20 2200 SCDs Start: 09/03/20 1821     Code Status: Full Code  Family Communication: no family at bedside   Status is: Inpatient  Remains inpatient appropriate because:Inpatient level of care appropriate due to severity of illness   Dispo: The patient is from: Home              Anticipated d/c is to: Shelter              Anticipated d/c date is: Unclear              Patient currently is medically stable to d/c.  But has no placement   Difficult to place patient Yes  Level of care: Med-Surg  Consultants:  none  Procedures:  None   Microbiology  None   Antimicrobials: Ceftriaxone / Azithromycin    Objective: Vitals:   09/22/20 1643 09/22/20 2034 09/23/20 0500 09/23/20 0553  BP: 113/81 127/84  119/65  Pulse: 81 85    Resp: 20 20  20   Temp: 98.8 F (37.1 C) 98.2 F (36.8 C)  98.1 F (36.7 C)  TempSrc:    Oral  SpO2: 95% 94%    Weight:   81.3 kg   Height:       No intake or output data in the 24 hours ending 09/23/20 1039 Filed Weights   09/13/20 0500 09/22/20 0500 09/23/20 0500  Weight: 79.1 kg 78.2 kg 81.3 kg    Examination:  Constitutional: No distress Eyes: No icterus ENMT: Moist mucous membranes.  Neck: normal,  supple Respiratory: Clear bilaterally, no wheezing, no crackles Cardiovascular: Regular rate and rhythm, no murmurs, no edema Abdomen: Soft, nontender, nondistended, positive bowel sounds Skin: No rashes MSK: Left knee mildly tender to palpation on the medial aspect, good range of motion, no significant swelling or erythema noted, no new findings today Neurologic: Nonfocal   Data Reviewed: I have independently reviewed following labs and imaging studies   CBC: Recent Labs  Lab 09/17/20 0420 09/18/20 0838 09/19/20 0229 09/22/20 0150  WBC 4.6 4.6 5.0 4.8  HGB 12.1* 12.6* 11.7* 11.7*  HCT 35.2* 37.5* 35.0* 37.2*  MCV 96.7 95.2 94.9 95.9  PLT 335 365 349 466   Basic Metabolic Panel: Recent Labs  Lab 09/17/20 0420 09/18/20 0838 09/19/20 0229 09/22/20 0150  NA 140 139 140 140  K 3.9 4.0 4.3 4.2  CL 110 110 110 111  CO2 17* 20* 18* 20*  GLUCOSE 120* 111* 98 107*  BUN 15 10 10 8   CREATININE 1.62* 1.30* 1.30* 1.34*  CALCIUM 8.9 9.1 9.3 9.3   Liver Function Tests: Recent Labs  Lab 09/17/20 0420 09/22/20 0150  AST 49* 39  ALT 80* 46*  ALKPHOS 50 47  BILITOT 0.6 0.6  PROT 7.4 7.2  ALBUMIN 2.3* 2.3*   Coagulation Profile: No results for input(s): INR, PROTIME in the last 168 hours. HbA1C: No results for input(s): HGBA1C in the last 72 hours. CBG: Recent Labs  Lab 09/22/20 0802 09/22/20 1212 09/22/20 1644 09/22/20 2033 09/23/20 0744  GLUCAP 101* 118* 120* 117* 95    Recent Results (from the past 240 hour(s))  Culture, blood (routine x 2)     Status: None   Collection Time: 09/15/20  2:26 PM   Specimen: BLOOD  Result Value Ref Range Status   Specimen Description BLOOD RIGHT ANTECUBITAL  Final   Special Requests   Final    BOTTLES DRAWN AEROBIC ONLY Blood Culture adequate volume   Culture   Final    NO GROWTH 5 DAYS Performed at [facility], [place] [address] [place], [place] [address]    Report Status 09/20/2020 FINAL  Final  Culture, blood  (routine x 2)     Status: None   Collection Time: 09/15/20  2:56 PM   Specimen: BLOOD  Result Value Ref Range Status   Specimen Description BLOOD RIGHT ANTECUBITAL  Final   Special Requests   Final    BOTTLES DRAWN AEROBIC ONLY Blood Culture adequate volume   Culture   Final    NO GROWTH 5 DAYS Performed at [facility], [place] [address] [place], [place] [address]    Report Status 09/20/2020 FINAL  Final  Culture, Urine     Status: Abnormal   Collection Time: 09/15/20  4:25 PM   Specimen: Urine, Random  Result Value Ref Range Status   Specimen Description URINE, RANDOM  Final   Special Requests   Final    NONE Performed at [facility], [place] [address], [place], [place] [address]    Culture >=100,000 COLONIES/mL ESCHERICHIA COLI (A)  Final   Report Status 09/18/2020 FINAL  Final   Organism ID, Bacteria ESCHERICHIA COLI (A)  Final      Susceptibility   Escherichia coli - MIC*    AMPICILLIN >=32 RESISTANT Resistant     CEFAZOLIN <=4 SENSITIVE Sensitive     CEFEPIME <=0.12 SENSITIVE Sensitive     CEFTRIAXONE <=0.25 SENSITIVE Sensitive     CIPROFLOXACIN 0.5 SENSITIVE Sensitive     GENTAMICIN <=1 SENSITIVE Sensitive     IMIPENEM <=0.25 SENSITIVE Sensitive     NITROFURANTOIN <=16 SENSITIVE Sensitive     TRIMETH/SULFA >=320 RESISTANT Resistant     AMPICILLIN/SULBACTAM >=32 RESISTANT Resistant     PIP/TAZO <=4 SENSITIVE Sensitive     * >=100,000 COLONIES/mL ESCHERICHIA COLI  SARS CORONAVIRUS 2 (TAT 6-24 HRS) Nasopharyngeal Nasopharyngeal Swab     Status: Abnormal   Collection Time: 09/20/20 10:19 AM   Specimen: Nasopharyngeal Swab  Result Value Ref Range Status   SARS Coronavirus 2 POSITIVE (A) NEGATIVE Final    Comment: (NOTE) SARS-CoV-2 target nucleic acids are DETECTED.  The SARS-CoV-2 RNA is generally detectable in upper and lower respiratory specimens during the acute phase of infection. Positive results are indicative of the presence of SARS-CoV-2 RNA.  Clinical correlation with patient history and other diagnostic information is  necessary to determine patient infection status. Positive results do not rule out bacterial infection or co-infection with other viruses.  The expected result is Negative.  Fact Sheet for Patients: SugarRoll.be  Fact Sheet for Healthcare  Providers: https://www.woods-mathews.com/  This test is not yet approved or cleared by the [place] and  has been authorized for detection and/or diagnosis of SARS-CoV-2 by FDA under an Emergency Use Authorization (EUA). This EUA will remain  in effect (meaning this test can be used) for the duration of the COVID-19 declaration under Section 564(b)(1) of the Act, 21 U. S.C. section 360bbb-3(b)(1), unless the authorization is terminated or revoked sooner.   Performed at [facility], [place] [address], [place], [place] [address]      Radiology Studies: No results found.   Marzetta Board, MD, PhD [facility]  Between 7 am - 7 pm I am available, please contact me via Amion or Securechat  Between 7 pm - 7 am I am not available, please contact night coverage MD/APP via Amion

## 2020-09-24 ENCOUNTER — Inpatient Hospital Stay (HOSPITAL_COMMUNITY): Payer: Medicare HMO

## 2020-09-24 DIAGNOSIS — M25562 Pain in left knee: Secondary | ICD-10-CM

## 2020-09-24 DIAGNOSIS — J9601 Acute respiratory failure with hypoxia: Secondary | ICD-10-CM | POA: Diagnosis not present

## 2020-09-24 DIAGNOSIS — U071 COVID-19: Secondary | ICD-10-CM | POA: Diagnosis not present

## 2020-09-24 LAB — CBC
HCT: 36.7 % — ABNORMAL LOW (ref 39.0–52.0)
Hemoglobin: 12.2 g/dL — ABNORMAL LOW (ref 13.0–17.0)
MCH: 31.4 pg (ref 26.0–34.0)
MCHC: 33.2 g/dL (ref 30.0–36.0)
MCV: 94.3 fL (ref 80.0–100.0)
Platelets: 368 10*3/uL (ref 150–400)
RBC: 3.89 MIL/uL — ABNORMAL LOW (ref 4.22–5.81)
RDW: 14.3 % (ref 11.5–15.5)
WBC: 5.2 10*3/uL (ref 4.0–10.5)
nRBC: 0 % (ref 0.0–0.2)

## 2020-09-24 LAB — BASIC METABOLIC PANEL
Anion gap: 10 (ref 5–15)
BUN: 11 mg/dL (ref 8–23)
CO2: 22 mmol/L (ref 22–32)
Calcium: 9.3 mg/dL (ref 8.9–10.3)
Chloride: 107 mmol/L (ref 98–111)
Creatinine, Ser: 1.07 mg/dL (ref 0.61–1.24)
GFR, Estimated: >60 mL/min (ref >60)
Glucose, Bld: 94 mg/dL (ref 70–99)
Potassium: 4.3 mmol/L (ref 3.5–5.1)
Sodium: 139 mmol/L (ref 135–145)

## 2020-09-24 LAB — GLUCOSE, CAPILLARY
Glucose-Capillary: 97 mg/dL (ref 70–99)
Glucose-Capillary: 169 mg/dL — ABNORMAL HIGH (ref 70–99)
Glucose-Capillary: 82 mg/dL (ref 70–99)
Glucose-Capillary: 100 mg/dL — ABNORMAL HIGH (ref 70–99)

## 2020-09-24 MED ORDER — FENTANYL CITRATE (PF) 100 MCG/2ML IJ SOLN
25.0000 ug | Freq: Once | INTRAMUSCULAR | Status: AC
  Administered 2020-09-24: 25 ug via INTRAVENOUS
  Filled 2020-09-24: qty 2

## 2020-09-24 NOTE — Progress Notes (Signed)
PROGRESS NOTE    Todd [name].  PJA:[id number] DOB: December 10, 1955 DOA: 09/03/2020 PCP: Todd Carol, MD   Brief Narrative:  Patient is a 65 year old African-American male with a past medical history significant for but not limited to diabetes mellitus type 2, hypertension, CKD stage IIIa, alcohol and tobacco abuse overall history of heroin abuse who presented from his homeless shelter when he was found down.  He was intubated in the ER under suspicion for seizure-like activity after intubation.  He was also found to be COVID-19 positive but was not treated with remdesivir or steroids at that time as symptoms were felt to be from aspiration.  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  EEG was done and showed diffuse encephalopathy.  LP was also done and was negative for meningitis.  He initially received IV vancomycin, IV Zosyn and IV acyclovir which was later transitioned to Unasyn for aspiration pneumonia.  He was seen by cardiology due to elevated troponin and they recommended outpatient ischemic work-up.  Also seen by GI due to severe transaminitis and advised to continue to monitor.  LFTs have improved and almost resolved.  PT OT recommending SNF.  He is deemed stable to be discharged wanted to go back to the [facility] however they requested a negative Covid test which was unreasonable and because his repeat Covid test on Saturday was still positive they discharged the patient from [facility] and he will need to go to SNF now that he longer has a bed available at [facility].  Transitions of care assisting with placement.  Assessment & Plan:   Active Problems:   Acute respiratory failure with hypoxia (HCC)  Acute hypoxic respiratory failure due to COVID-19 infection, aspiration pneumonia -Initially admitted to the ICU and was intubated in the ED as he was found down.  Successfully extubated on 1/20,  currently on room air.  Due to bilateral patchy infiltrates and recent fevers, elevated procalcitonin he completed 5 days of ceftriaxone and azithromycin. He initially tested positive for 1/19, given respiratory involvement will need to be isolated for 21 days up until 2/9.  Did not receive Remdesivir or steroids on admission.  -Remains on room air and respiratory status is stable but has been having a slight cough -Repeat CXR ordered and showed "Stable cardiomediastinal silhouette. No pneumothorax or pleural effusion is noted. Stable patchy airspace opacities are noted consistent multifocal pneumonia. Bony thorax is unremarkable." -SpO2: 98 % FiO2 (%): 40 % -Continue to Monitor Respiratory Status carefully and is currently on room air  Acute metabolic encephalopathy, unresponsive episode -there were initial concerns for acute meningitis/encephalitis.  He was initially on broad-spectrum antibiotics with vancomycin, Zosyn, acyclovir, underwent an LP which was negative.   -Antibiotics were discontinued.  He underwent an EEG which showed diffuse encephalopathy without seizures, and UDS was negative.  Mental status returned to baseline  Elevated troponin -2D echo showed EF 34%, grade 1 diastolic dysfunction.   -Cardiology saw him and recommended for outpatient ischemic evaluation on discharge  Possible balanitis, candidal infection -Started clotrimazole 1% twice daily for 7 days on 2/5.  Improving  Osteoarthritis, chronic back pain, chronic left knee pain -Patient got a brace for his back, left knee sleeve -Repeat Knee X-Ray obtained Mild degenerative joint disease. No acute abnormality seen in the left knee -Will obtain Uric Acid Level to evaluate for Gout in the AM  -Given 1x of IV Fentanyl  Acute kidney injury on  chronic kidney disease stage IIIa -Creatinine at baseline and today his BUN/creatinine was 11/1.07 and is trended down from 8/1.34 -Avoid nephrotoxic medications, contrast  dyes, hypotension and renally dose medications  -repeat CMP in a.m.  Transaminitis, alcohol pattern, history of hep C -significant elevated liver enzymes on admission, concern for shock liver versus ischemic hepatitis versus metabolic hepatitis.  GI consulted and evaluated patient.  LFTs almost normalized on last check on 09/21/2020 where his AST was 39 and his ALT is 46  Mild rhabdomyolysis -resolved with IV fluids  Diarrhea -C. difficile negative, possibly due to COVID-19 -Continue to monitor and trend  Microcytic anemia -Suspect from underlying alcohol use. Hemoglobin stable and is actually improved.  Now hemoglobin/hematocrit is 12.2/36.7 -Repeat CBC in the a.m. 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  He was on Coumadin in the past -Continue to Monitor   History of dilated aorta -outpatient follow-up with cardiothoracic surgery  UTI with sepsis -Was on Ceftriaxone and stopped   Disposition-Stable to be discharged, however does not want SNF and wants to go back to [facility]. Unfortunately [facility] has an unreasonable request for patient to test negative prior to his return. He just tested positive again on 2/5. They are not going to hold his bed anymore, and per SW they will bring his belongings to the hospital today to be given to the patient  HTN -C/w Amlodipine 5 mg po Daily   DVT prophylaxis: Heparin 5,000 units sq q8h Code Status: FULL CODE  Family Communication: No family present at bedside  Disposition Plan: SNF when bed available   Status is: Inpatient  Remains inpatient appropriate because:Unsafe d/c plan, IV treatments appropriate due to intensity of illness or inability to take PO and Inpatient level of care appropriate due to severity of illness   Dispo: The patient  is from: Home              Anticipated d/c is to: SNF              Anticipated d/c date is: 2 days              Patient currently is medically stable to d/c.   Difficult to place patient No  Consultants:   None   Procedures: Intubated on 09/02/2020 and then extubated on 09/04/2020  Antimicrobials:  Anti-infectives (From admission, onward)   Start     Dose/Rate Route Frequency Ordered Stop   09/17/20 1800  azithromycin (ZITHROMAX) tablet 500 mg        500 mg Oral Daily-1800 09/17/20 0852 09/19/20 1758   09/15/20 1800  azithromycin (ZITHROMAX) 500 mg in sodium chloride 0.9 % 250 mL IVPB  Status:  Discontinued        500 mg 250 mL/hr over 60 Minutes Intravenous Every 24 hours 09/15/20 1642 09/17/20 0852   09/15/20 1730  cefTRIAXone (ROCEPHIN) 2 g in sodium chloride 0.9 % 100 mL IVPB        2 g 200 mL/hr over 30 Minutes Intravenous Every 24 hours 09/15/20 1642 09/19/20 1748   09/15/20 1715  cefTRIAXone (ROCEPHIN) 1 g in sodium chloride 0.9 % 100 mL IVPB  Status:  Discontinued        1 g 200 mL/hr over 30 Minutes Intravenous Every 24 hours 09/15/20 1625 09/15/20 1642   09/08/20 1800  Ampicillin-Sulbactam (  UNASYN) 3 g in sodium chloride 0.9 % 100 mL IVPB        3 g 200 mL/hr over 30 Minutes Intravenous Every 8 hours 09/08/20 1314 09/09/20 1748   09/04/20 2000  acyclovir (ZOVIRAX) 640 mg in dextrose 5 % 100 mL IVPB  Status:  Discontinued        10 mg/kg  63.8 kg (Ideal) 112.8 mL/hr over 60 Minutes Intravenous Every 12 hours 09/04/20 0826 09/04/20 0907   09/04/20 1000  Ampicillin-Sulbactam (UNASYN) 3 g in sodium chloride 0.9 % 100 mL IVPB  Status:  Discontinued        3 g 200 mL/hr over 30 Minutes Intravenous Every 8 hours 09/04/20 0907 09/08/20 1315   09/03/20 2200  piperacillin-tazobactam (ZOSYN) IVPB 3.375 g  Status:  Discontinued        3.375 g 12.5 mL/hr over 240 Minutes Intravenous Every 8 hours 09/03/20 1858 09/03/20 1928   09/03/20 2002  vancomycin variable dose per unstable  renal function (pharmacist dosing)  Status:  Discontinued         Does not apply See admin instructions 09/03/20 2002 09/04/20 0907   09/03/20 2000  acyclovir (ZOVIRAX) 1,000 mg in dextrose 5 % 150 mL IVPB  Status:  Discontinued        10 mg/kg  100 kg 170 mL/hr over 60 Minutes Intravenous Every 12 hours 09/03/20 1937 09/04/20 0826   09/03/20 1930  piperacillin-tazobactam (ZOSYN) IVPB 3.375 g  Status:  Discontinued        3.375 g 12.5 mL/hr over 240 Minutes Intravenous Every 8 hours 09/03/20 1928 09/04/20 0907   09/03/20 1815  ceFEPIme (MAXIPIME) 2 g in sodium chloride 0.9 % 100 mL IVPB  Status:  Discontinued        2 g 200 mL/hr over 30 Minutes Intravenous  Once 09/03/20 1801 09/03/20 1828   09/03/20 1815  metroNIDAZOLE (FLAGYL) IVPB 500 mg  Status:  Discontinued        500 mg 100 mL/hr over 60 Minutes Intravenous  Once 09/03/20 1801 09/03/20 1828   09/03/20 1815  vancomycin (VANCOCIN) IVPB 1000 mg/200 mL premix  Status:  Discontinued        1,000 mg 200 mL/hr over 60 Minutes Intravenous  Once 09/03/20 1801 09/03/20 1812   09/03/20 1815  vancomycin (VANCOREADY) IVPB 2000 mg/400 mL        2,000 mg 200 mL/hr over 120 Minutes Intravenous  Once 09/03/20 1812 09/03/20 2326        Subjective: Seen and examined at bedside and still had somewhat of a cough and felt mildly short of breath but not wearing any supplemental O2 via nasal cannula.  Complaining of significant left knee pain.  No chest pain, lightheadedness or dizziness.  No other concerns or complaint at this time.  Objective: Vitals:   09/23/20 0500 09/23/20 0553 09/23/20 2054 09/24/20 0500  BP:  119/65 (!) 131/92   Pulse:   72   Resp:  20 20   Temp:  98.1 F (36.7 C) 98.5 F (36.9 C)   TempSrc:  Oral    SpO2:   94%   Weight: 81.3 kg   78.6 kg  Height:        Intake/Output Summary (Last 24 hours) at 09/24/2020 0900 Last data filed at 09/24/2020 0600 Gross per 24 hour  Intake 200 ml  Output 775 ml  Net -575 ml   Filed  Weights   09/22/20 0500 09/23/20 0500 09/24/20 0500  Weight: 78.2 kg  81.3 kg 78.6 kg   Examination: Physical Exam:  Constitutional: WN/WD African-American male currently in no acute distress Eyes: Lids and conjunctivae normal, sclerae anicteric  ENMT: External Ears, Nose appear normal. Grossly normal hearing. Neck: Appears normal, supple, no cervical masses, normal ROM, no appreciable thyromegaly; no JVD Respiratory: Diminished to auscultation bilaterally with coarse breath sounds, no wheezing, rales, rhonchi or crackles. Normal respiratory effort and patient is not tachypenic. No accessory muscle use.  Cardiovascular: RRR, no murmurs / rubs / gallops. S1 and S2 auscultated.  Trace extremity edema Abdomen: Soft, non-tender, non-distended.Bowel sounds positive.  GU: Deferred. Musculoskeletal: No clubbing / cyanosis of digits/nails.  Has pain on left knee palpation but no appreciable swelling noted Skin: No rashes, lesions, ulcers on limited skin evaluation. No induration; Warm and dry.  Neurologic: CN 2-12 grossly intact with no focal deficits.  Romberg sign and cerebellar reflexes not assessed.  Psychiatric: Normal judgment and insight. Alert and oriented x 3. Normal mood and appropriate affect.   Data Reviewed: I have personally reviewed following labs and imaging studies  CBC: Recent Labs  Lab 09/18/20 0838 09/19/20 0229 09/22/20 0150 09/24/20 0257  WBC 4.6 5.0 4.8 5.2  HGB 12.6* 11.7* 11.7* 12.2*  HCT 37.5* 35.0* 37.2* 36.7*  MCV 95.2 94.9 95.9 94.3  PLT 365 349 394 161   Basic Metabolic Panel: Recent Labs  Lab 09/18/20 0838 09/19/20 0229 09/22/20 0150 09/24/20 0257  NA 139 140 140 139  K 4.0 4.3 4.2 4.3  CL 110 110 111 107  CO2 20* 18* 20* 22  GLUCOSE 111* 98 107* 94  BUN 10 10 8 11   CREATININE 1.30* 1.30* 1.34* 1.07  CALCIUM 9.1 9.3 9.3 9.3   GFR: Estimated Creatinine Clearance: 68.8 mL/min (by C-G formula based on SCr of 1.07 mg/dL). Liver Function  Tests: Recent Labs  Lab 09/22/20 0150  AST 39  ALT 46*  ALKPHOS 47  BILITOT 0.6  PROT 7.2  ALBUMIN 2.3*   No results for input(s): LIPASE, AMYLASE in the last 168 hours. No results for input(s): AMMONIA in the last 168 hours. Coagulation Profile: No results for input(s): INR, PROTIME in the last 168 hours. Cardiac Enzymes: No results for input(s): CKTOTAL, CKMB, CKMBINDEX, TROPONINI in the last 168 hours. BNP (last 3 results) No results for input(s): PROBNP in the last 8760 hours. HbA1C: No results for input(s): HGBA1C in the last 72 hours. CBG: Recent Labs  Lab 09/23/20 0744 09/23/20 1158 09/23/20 1553 09/23/20 2051 09/24/20 0734  GLUCAP 95 106* 93 101* 97   Lipid Profile: No results for input(s): CHOL, HDL, LDLCALC, TRIG, CHOLHDL, LDLDIRECT in the last 72 hours. Thyroid Function Tests: No results for input(s): TSH, T4TOTAL, FREET4, T3FREE, THYROIDAB in the last 72 hours. Anemia Panel: No results for input(s): VITAMINB12, FOLATE, FERRITIN, TIBC, IRON, RETICCTPCT in the last 72 hours. Sepsis Labs: No results for input(s): PROCALCITON, LATICACIDVEN in the last 168 hours.  Recent Results (from the past 240 hour(s))  Culture, blood (routine x 2)     Status: None   Collection Time: 09/15/20  2:26 PM   Specimen: BLOOD  Result Value Ref Range Status   Specimen Description BLOOD RIGHT ANTECUBITAL  Final   Special Requests   Final    BOTTLES DRAWN AEROBIC ONLY Blood Culture adequate volume   Culture   Final    NO GROWTH 5 DAYS Performed at [facility], [address], [place], [place] [address]    Report Status 09/20/2020 FINAL  Final  Culture, blood (routine x 2)     Status: None   Collection Time: 09/15/20  2:56 PM   Specimen: BLOOD  Result Value Ref Range Status   Specimen Description BLOOD RIGHT ANTECUBITAL  Final   Special Requests   Final    BOTTLES DRAWN AEROBIC ONLY Blood Culture adequate volume   Culture   Final    NO GROWTH 5 DAYS Performed at  [facility], [address] [place], [place] [address]    Report Status 09/20/2020 FINAL  Final  Culture, Urine     Status: Abnormal   Collection Time: 09/15/20  4:25 PM   Specimen: Urine, Random  Result Value Ref Range Status   Specimen Description URINE, RANDOM  Final   Special Requests   Final    NONE Performed at [facility], [place] [address] [place], [place] [address]    Culture >=100,000 COLONIES/mL ESCHERICHIA COLI (A)  Final   Report Status 09/18/2020 FINAL  Final   Organism ID, Bacteria ESCHERICHIA COLI (A)  Final      Susceptibility   Escherichia coli - MIC*    AMPICILLIN >=32 RESISTANT Resistant     CEFAZOLIN <=4 SENSITIVE Sensitive     CEFEPIME <=0.12 SENSITIVE Sensitive     CEFTRIAXONE <=0.25 SENSITIVE Sensitive     CIPROFLOXACIN 0.5 SENSITIVE Sensitive     GENTAMICIN <=1 SENSITIVE Sensitive     IMIPENEM <=0.25 SENSITIVE Sensitive     NITROFURANTOIN <=16 SENSITIVE Sensitive     TRIMETH/SULFA >=320 RESISTANT Resistant     AMPICILLIN/SULBACTAM >=32 RESISTANT Resistant     PIP/TAZO <=4 SENSITIVE Sensitive     * >=100,000 COLONIES/mL ESCHERICHIA COLI  SARS CORONAVIRUS 2 (TAT 6-24 HRS) Nasopharyngeal Nasopharyngeal Swab     Status: Abnormal   Collection Time: 09/20/20 10:19 AM   Specimen: Nasopharyngeal Swab  Result Value Ref Range Status   SARS Coronavirus 2 POSITIVE (A) NEGATIVE Final    Comment: (NOTE) SARS-CoV-2 target nucleic acids are DETECTED.  The SARS-CoV-2 RNA is generally detectable in upper and lower respiratory specimens during the acute phase of infection. Positive results are indicative of the presence of SARS-CoV-2 RNA. Clinical correlation with patient history and other diagnostic information is  necessary to determine patient infection status. Positive results do not rule out bacterial infection or co-infection with other viruses.  The expected result is Negative.  Fact Sheet for  Patients: SugarRoll.be  Fact Sheet for Healthcare Providers: https://www.woods-mathews.com/  This test is not yet approved or cleared by the [place] FDA and  has been authorized for detection and/or diagnosis of SARS-CoV-2 by FDA under an Emergency Use Authorization (EUA). This EUA will remain  in effect (meaning this test can be used) for the duration of the COVID-19 declaration under Section 564(b)(1) of the Act, 21 U. S.C. section 360bbb-3(b)(1), unless the authorization is terminated or revoked sooner.   Performed at [facility], [place] [address], [place], [place] [address]      RN Pressure Injury Documentation:     Estimated body mass index is 27.97 kg/m as calculated from the following:   Height as of this encounter: 5\' 6"  (1.676 m).   Weight as of this encounter: 78.6 kg.  Malnutrition Type:   Malnutrition Characteristics:   Nutrition Interventions:    Radiology Studies: No results found.  Scheduled Meds: . amLODipine  5 mg Oral Daily  . aspirin EC  81 mg Oral Daily  . Chlorhexidine Gluconate Cloth  6 each Topical Daily  . clotrimazole   Topical BID  . docusate sodium  100 mg Oral BID  . folic acid  1 mg Oral Daily  . heparin  5,000 Units Subcutaneous Q8H  . insulin aspart  0-9 Units Subcutaneous TID WC & HS  . metoprolol tartrate  25 mg Oral BID  . multivitamin with minerals  1 tablet Oral Daily  . pantoprazole  40 mg Oral QHS  . polyethylene glycol  17 g Oral Daily  . thiamine  100 mg Oral Daily   Continuous Infusions:   LOS: 21 days   Kerney Elbe, DO [facility] PAGER is on [place]  If 7PM-7AM, please contact night-coverage [email or web address]

## 2020-09-24 NOTE — Progress Notes (Signed)
Physical Therapy Treatment Patient Details Name: Todd [name]. MRN: [id number] DOB: Apr 07, 1956 Today's Date: 09/24/2020    History of Present Illness 65 year old with history of DM2, HTN, CKD, alcohol/tobacco and heroin abuse, Hepatitis C presented 09/03/20 from homeless shelter when he was found down. Intubated 1/19-1/20.  There was suspicion for seizure-like activity after intubation. UDS negative.   CT head showed bilateral chronic infarct, moderate cerebellar disease but nothing acute.  EEG showed diffuse encephalopathy.  LP was negative for meningitis; Also found to have COVID-19.+aspiration pneumonia;    PT Comments    Pt admitted with above diagnosis. Pt was limited today by left knee pain. Couldn't walk but a few steps initially. Applied heat while changing pts linens. Pt then asked to walk to bathroom. He stated that the knee did feel a bit better after heat applied but still asking nurse for pain meds.  Will continue to progress pt as able.  Pt currently with functional limitations due to balance and endurance deficits. Pt will benefit from skilled PT to increase their independence and safety with mobility to allow discharge to the venue listed below.     Follow Up Recommendations  Home health PT (at [facility])     Equipment Recommendations  Other (comment) (rollator)    Recommendations for Other Services OT consult     Precautions / Restrictions Precautions Precautions: Fall Precaution Comments: \ Restrictions Weight Bearing Restrictions: No    Mobility  Bed Mobility Overal bed mobility: Needs Assistance Bed Mobility: Supine to Sit     Supine to sit: Supervision Sit to supine: Supervision   General bed mobility comments: bed flat; no physical assist needed,  Transfers Overall transfer level: Needs assistance Equipment used: Rolling walker (2 wheeled) Transfers: Sit to/from [facility] Sit to Stand: Supervision         General  transfer comment: demonstrated to pt correct use during transfer from bed; pt return demonstratedwith proper technique;return demonstrated transfer to sitting on the seat of the rollator  Ambulation/Gait Ambulation/Gait assistance: Supervision Gait Distance (Feet): 26 Feet (6 feet then 20 feet) Assistive device: 4-wheeled walker Gait Pattern/deviations: Step-through pattern;Decreased stride length;Trunk flexed;Antalgic;Decreased weight shift to left;Decreased stance time - left Gait velocity: decr Gait velocity interpretation: 1.31 - 2.62 ft/sec, indicative of limited community ambulator General Gait Details: Pt ambulated a few steps and stated his left knee hurt so bad he couldnt do it.  Talked pt into getting into chair and applied heat so I could change his bed. Pt then stated he needed to use bathroom after in chair 5 min.  Pt ambulated to the bathroom and states that the heat did seem to ease the pain a bit but was still limping a little.  Pt used rollator appropriately, including 180 degree turn, stepping backwards into bathroom and pulling walker over the threshold.   Stairs             Wheelchair Mobility    Modified Rankin (Stroke Patients Only)       Balance Overall balance assessment: Needs assistance Sitting-balance support: No upper extremity supported;Feet supported Sitting balance-Leahy Scale: Fair     Standing balance support: Single extremity supported;Bilateral upper extremity supported Standing balance-Leahy Scale: Poor Standing balance comment: reliant on at least one UE support                            Cognition Arousal/Alertness: Awake/alert Behavior During Therapy: Agitated Overall Cognitive Status: No  family/caregiver present to determine baseline cognitive functioning Area of Impairment: Safety/judgement;Following commands;Problem solving;Awareness                 Orientation Level:  (NT) Current Attention Level:  Sustained Memory: Decreased short-term memory Following Commands: Follows one step commands consistently Safety/Judgement: Decreased awareness of deficits;Decreased awareness of safety Awareness: Emergent Problem Solving: Slow processing General Comments: fluctuations between extreme agitation, sadness, and regular communication      Exercises General Exercises - Lower Extremity Long Arc Quad: AROM;Both;10 reps;Seated    General Comments General comments (skin integrity, edema, etc.): Heat applied to left knee for 5 min and then pt had to go have BM.      Pertinent Vitals/Pain Pain Assessment: Faces Faces Pain Scale: Hurts even more Pain Location: left knee Pain Descriptors / Indicators: Cramping Pain Intervention(s): Limited activity within patient's tolerance;Monitored during session;Repositioned;Patient requesting pain meds-RN notified;Heat applied    Home Living                      Prior Function            PT Goals (current goals can now be found in the care plan section) Acute Rehab PT Goals Patient Stated Goal: to get a rollator Progress towards PT goals: Progressing toward goals    Frequency    Min 2X/week      PT Plan Current plan remains appropriate    Co-evaluation              AM-PAC PT "6 Clicks" Mobility   Outcome Measure  Help needed turning from your back to your side while in a flat bed without using bedrails?: None Help needed moving from lying on your back to sitting on the side of a flat bed without using bedrails?: None Help needed moving to and from a bed to a chair (including a wheelchair)?: A Little Help needed standing up from a chair using your arms (e.g., wheelchair or bedside chair)?: None Help needed to walk in hospital room?: A Little Help needed climbing 3-5 steps with a railing? : A Little 6 Click Score: 21    End of Session Equipment Utilized During Treatment: Gait belt Activity Tolerance: Patient tolerated  treatment well Patient left: with call bell/phone within reach (left on toilet and nurse made aware) Nurse Communication: Mobility status;Other (comment) (left on toilet and nurse made aware) PT Visit Diagnosis: Muscle weakness (generalized) (M62.81);Other abnormalities of gait and mobility (R26.89);Pain Pain - Right/Left:  (midline) Pain - part of body:  (abdomen)     Time: 4174-0814 PT Time Calculation (min) (ACUTE ONLY): 30 min  Charges:  $Gait Training: 8-22 mins $Self Care/Home Management: 8-22                     Keenan Dimitrov W,PT Acute Rehabilitation Services Pager:  [phone]  Office:  [place] 09/24/2020, 1:57 PM

## 2020-09-24 NOTE — Progress Notes (Signed)
OT Cancellation Note  Patient Details Name: Todd [name]. MRN: [id number] DOB: 1955-09-13   Cancelled Treatment:    Reason Eval/Treat Not Completed: Patient declined, no reason specified (Pt argumentative and unwiling to participate earlier today.)  OT to continue to follow.  Jefferey Pica, OTR/L Acute Rehabilitation Services Pager: [phone] Office: [phone]   Jefferey Pica 09/24/2020, 5:11 PM

## 2020-09-24 NOTE — TOC Progression Note (Signed)
Transition of Care (TOC) - Progression Note    Patient Details  Name: Todd [name]. MRN: [id number] Date of Birth: 03/13/1956  Transition of Care [facility]) CM/SW [place], RN Phone Number: [phone]  09/24/2020, 9:31 AM  Clinical Narrative:    CM received call from [facility] requesting that CM retrieve white bag of clothing that was delivered to the unit on 2/8 yesterday because it may belong to someone else. CM at bedside to speak with patient and patient states that he is not sure that he even has all of his belongings. Patient is not willing to give up the bag at this time. [facility] has been made aware.    Expected Discharge Plan: Homeless Shelter Barriers to Discharge: Active Substance Use - Placement,ED Unsafe disposition,Homeless with medical needs  Expected Discharge Plan and Services Expected Discharge Plan: Homeless Shelter In-house Referral: NA Discharge Planning Services: CM Consult   Living arrangements for the past 2 months: Homeless                 DME Arranged: N/A                     Social Determinants of Health (SDOH) Interventions    Readmission Risk Interventions Readmission Risk Prevention Plan 09/16/2020  Transportation Screening Complete  PCP or Specialist Appt within 5-7 Days Complete  Home Care Screening Complete  Medication Review (RN CM) Referral to Pharmacy  Some recent data might be hidden

## 2020-09-24 NOTE — Plan of Care (Signed)
  Problem: Safety: Goal: Non-violent Restraint(s) 09/24/2020 1051 by Durwin Glaze, LPN Outcome: Progressing 09/24/2020 1046 by Durwin Glaze, LPN Outcome: Progressing

## 2020-09-25 DIAGNOSIS — M109 Gout, unspecified: Secondary | ICD-10-CM

## 2020-09-25 LAB — CBC WITH DIFFERENTIAL/PLATELET
Abs Immature Granulocytes: 0.08 10*3/uL — ABNORMAL HIGH (ref 0.00–0.07)
Basophils Absolute: 0 10*3/uL (ref 0.0–0.1)
Basophils Relative: 0 %
Eosinophils Absolute: 0.1 10*3/uL (ref 0.0–0.5)
Eosinophils Relative: 2 %
HCT: 38.2 % — ABNORMAL LOW (ref 39.0–52.0)
Hemoglobin: 11.9 g/dL — ABNORMAL LOW (ref 13.0–17.0)
Immature Granulocytes: 2 %
Lymphocytes Relative: 39 %
Lymphs Abs: 1.9 10*3/uL (ref 0.7–4.0)
MCH: 30.2 pg (ref 26.0–34.0)
MCHC: 31.2 g/dL (ref 30.0–36.0)
MCV: 97 fL (ref 80.0–100.0)
Monocytes Absolute: 0.7 10*3/uL (ref 0.1–1.0)
Monocytes Relative: 14 %
Neutro Abs: 2.2 10*3/uL (ref 1.7–7.7)
Neutrophils Relative %: 43 %
Platelets: 368 10*3/uL (ref 150–400)
RBC: 3.94 MIL/uL — ABNORMAL LOW (ref 4.22–5.81)
RDW: 14.4 % (ref 11.5–15.5)
WBC: 5 10*3/uL (ref 4.0–10.5)
nRBC: 0 % (ref 0.0–0.2)

## 2020-09-25 LAB — COMPREHENSIVE METABOLIC PANEL
ALT: 36 U/L (ref 0–44)
AST: 36 U/L (ref 15–41)
Albumin: 2.5 g/dL — ABNORMAL LOW (ref 3.5–5.0)
Alkaline Phosphatase: 55 U/L (ref 38–126)
Anion gap: 10 (ref 5–15)
BUN: 13 mg/dL (ref 8–23)
CO2: 24 mmol/L (ref 22–32)
Calcium: 9.3 mg/dL (ref 8.9–10.3)
Chloride: 107 mmol/L (ref 98–111)
Creatinine, Ser: 1.21 mg/dL (ref 0.61–1.24)
GFR, Estimated: >60 mL/min (ref >60)
Glucose, Bld: 106 mg/dL — ABNORMAL HIGH (ref 70–99)
Potassium: 4.5 mmol/L (ref 3.5–5.1)
Sodium: 141 mmol/L (ref 135–145)
Total Bilirubin: 0.5 mg/dL (ref 0.3–1.2)
Total Protein: 7 g/dL (ref 6.5–8.1)

## 2020-09-25 LAB — GLUCOSE, CAPILLARY
Glucose-Capillary: 104 mg/dL — ABNORMAL HIGH (ref 70–99)
Glucose-Capillary: 143 mg/dL — ABNORMAL HIGH (ref 70–99)
Glucose-Capillary: 236 mg/dL — ABNORMAL HIGH (ref 70–99)
Glucose-Capillary: 205 mg/dL — ABNORMAL HIGH (ref 70–99)

## 2020-09-25 LAB — MAGNESIUM: Magnesium: 1.6 mg/dL — ABNORMAL LOW (ref 1.7–2.4)

## 2020-09-25 LAB — URIC ACID: Uric Acid, Serum: 9.1 mg/dL — ABNORMAL HIGH (ref 3.7–8.6)

## 2020-09-25 LAB — PHOSPHORUS: Phosphorus: 3.5 mg/dL (ref 2.5–4.6)

## 2020-09-25 MED ORDER — KETOROLAC TROMETHAMINE 15 MG/ML IJ SOLN
15.0000 mg | Freq: Once | INTRAMUSCULAR | Status: AC
  Administered 2020-09-25: 15 mg via INTRAVENOUS
  Filled 2020-09-25: qty 1

## 2020-09-25 MED ORDER — MAGNESIUM SULFATE 2 GM/50ML IV SOLN
2.0000 g | Freq: Once | INTRAVENOUS | Status: AC
  Administered 2020-09-25: 2 g via INTRAVENOUS
  Filled 2020-09-25: qty 50

## 2020-09-25 MED ORDER — COLCHICINE 0.6 MG PO TABS
0.6000 mg | ORAL_TABLET | Freq: Two times a day (BID) | ORAL | Status: DC
  Administered 2020-09-25 – 2020-09-29 (×9): 0.6 mg via ORAL
  Filled 2020-09-25 (×9): qty 1

## 2020-09-25 MED ORDER — PREDNISONE 20 MG PO TABS
40.0000 mg | ORAL_TABLET | Freq: Every day | ORAL | Status: DC
  Administered 2020-09-25 – 2020-09-27 (×3): 40 mg via ORAL
  Filled 2020-09-25 (×3): qty 2

## 2020-09-25 NOTE — Progress Notes (Signed)
PROGRESS NOTE    Todd [name].  GHW:[id number] DOB: [birth year] DOA: 09/03/2020 PCP: [name], MD   Brief Narrative:  Patient is a 65 year old African-American male with a past medical history significant for but not limited to diabetes mellitus type 2, hypertension, CKD stage IIIa, alcohol and tobacco abuse overall history of heroin abuse who presented from his homeless shelter when he was found down.  He was intubated in the ER under suspicion for seizure-like activity after intubation.  He was also found to be COVID-19 positive but was not treated with remdesivir or steroids at that time as symptoms were felt to be from aspiration.  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  EEG was done and showed diffuse encephalopathy.  LP was also done and was negative for meningitis.  He initially received IV vancomycin, IV Zosyn and IV acyclovir which was later transitioned to Unasyn for aspiration pneumonia.  He was seen by cardiology due to elevated troponin and they recommended outpatient ischemic work-up.  Also seen by GI due to severe transaminitis and advised to continue to monitor.  LFTs have improved and almost resolved.  PT OT recommending SNF.  He is deemed stable to be discharged wanted to go back to the [facility] however they requested a negative Covid test which was unreasonable and because his repeat Covid test on Saturday was still positive they discharged the patient from [facility] and he will need to go to SNF now that he longer has a bed available at [facility].  Transitions of care assisting with placement.  Patient continues to complain of left knee pain and apparently did not have a brace with this was ordered.  He likely has gout given his elevated uric acid level so we will start him on prednisone as well as colchicine.  Is given a one-time dose of IV ketorolac today.   Assessment & Plan:    Active Problems:   Acute respiratory failure with hypoxia (HCC)  Acute hypoxic respiratory failure due to COVID-19 infection, aspiration pneumonia -Initially admitted to the ICU and was intubated in the ED as he was found down.  Successfully extubated on 1/20, currently on room air.  Due to bilateral patchy infiltrates and recent fevers, elevated procalcitonin he completed 5 days of ceftriaxone and azithromycin. He initially tested positive for 1/19, given respiratory involvement will need to be isolated for 21 days up until 2/9.  Did not receive Remdesivir or steroids on admission.  -Remains on room air and respiratory status is stable but has been having a slight cough -Repeat CXR ordered and showed "Stable cardiomediastinal silhouette. No pneumothorax or pleural effusion is noted. Stable patchy airspace opacities are noted consistent multifocal pneumonia. Bony thorax is unremarkable." -SpO2: 96 % FiO2 (%): 40 % -Continue to Monitor Respiratory Status carefully and is currently on room air -We will need an ambulatory home O2 screen prior to discharge  Acute metabolic encephalopathy, unresponsive episode -there were initial concerns for acute meningitis/encephalitis.  He was initially on broad-spectrum antibiotics with vancomycin, Zosyn, acyclovir, underwent an LP which was negative.   -Antibiotics were discontinued.  He underwent an EEG which showed diffuse encephalopathy without seizures, and UDS was negative.  Mental status returned to baseline  Elevated troponin -2D echo showed EF 78%, grade 1 diastolic dysfunction.   -Cardiology saw him and recommended for outpatient ischemic evaluation on discharge  Possible balanitis, candidal infection -Started clotrimazole 1% twice daily for  7 days on 2/5.  Improving  Osteoarthritis, chronic back pain, chronic left knee pain and now acute pain in the setting of likely gout -Patient got a brace for his back, left knee sleeve -Repeat Knee  X-Ray obtained Mild degenerative joint disease. No acute abnormality seen in the left knee -Will obtain Uric Acid Level to evaluate for Gout in the AM; uric acid level was elevated at 9.1 -Given 1x of IV Fentanyl yesterday but will start him on gout treatment with prednisone 40 mill daily, colchicine 0.6 mg twice daily as well as a one-time dose of IV ketorolac 15 mg  Acute kidney injury on chronic kidney disease stage IIIa -Creatinine at baseline and today his BUN/creatinine was 11/1.07 yesterday and today it is 13/1.21 \ -Given a one-time dose of IV ketorolac 15 mg x 1 so we will need to monitor renal function carefully -Avoid nephrotoxic medications, contrast dyes, hypotension and renally dose medications  -repeat CMP in a.m.  Transaminitis, alcohol pattern, history of hep C -significant elevated liver enzymes on admission, concern for shock liver versus ischemic hepatitis versus metabolic hepatitis.  GI consulted and evaluated patient.  LFTs almost normalized on last check on 09/21/2020 where his AST was 39 and his ALT is 3636  Mild rhabdomyolysis -resolved with IV fluids  Diarrhea -C. difficile negative, possibly due to COVID-19 -Continue to monitor and trend  Microcytic anemia -Suspect from underlying alcohol use. Hemoglobin stable and is actually improved.  Now hemoglobin/hematocrit is 12.2/36.7 yesterday and today it is 11.9/30.2 -Repeat CBC in the a.m. 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  He was on Coumadin in the past -Continue to Monitor   History of dilated aorta -outpatient follow-up with cardiothoracic surgery  UTI with sepsis -Was on  Ceftriaxone and stopped   Disposition-Stable to be discharged, however does not want SNF and wants to go back to [facility]. Unfortunately [facility] has an unreasonable request for patient to test negative prior to his return. He just tested positive again on 2/5. They are not going to hold his bed anymore, and per SW they will bring his belongings to the hospital today to be given to the patient  HTN -C/w Amlodipine 5 mg po Daily   DVT prophylaxis: Heparin 5,000 units sq q8h Code Status: FULL CODE  Family Communication: No family present at bedside  Disposition Plan: SNF when bed available as currently has no bed offers any is homeless  Status is: Inpatient  Remains inpatient appropriate because:Unsafe d/c plan, IV treatments appropriate due to intensity of illness or inability to take PO and Inpatient level of care appropriate due to severity of illness   Dispo: The patient is from: Home              Anticipated d/c is to: SNF              Anticipated d/c date is: 2 days              Patient currently is medically stable to d/c.   Difficult to place patient No not at the moment  Consultants:   None   Procedures: Intubated on 09/02/2020 and then extubated on 09/04/2020  Antimicrobials:  Anti-infectives (From admission, onward)   Start     Dose/Rate Route Frequency Ordered Stop   09/17/20 1800  azithromycin (ZITHROMAX) tablet 500 mg        500 mg Oral Daily-1800 09/17/20 0852 09/19/20 1758   09/15/20 1800  azithromycin (ZITHROMAX) 500 mg in sodium chloride 0.9 % 250 mL IVPB  Status:  Discontinued        500 mg 250 mL/hr over 60 Minutes Intravenous Every 24 hours 09/15/20 1642 09/17/20 0852   09/15/20 1730  cefTRIAXone (ROCEPHIN) 2 g in sodium chloride 0.9 % 100 mL IVPB        2 g 200 mL/hr over 30 Minutes Intravenous Every 24 hours 09/15/20 1642 09/19/20 1748   09/15/20 1715  cefTRIAXone (ROCEPHIN) 1 g in sodium chloride 0.9 % 100 mL IVPB  Status:   Discontinued        1 g 200 mL/hr over 30 Minutes Intravenous Every 24 hours 09/15/20 1625 09/15/20 1642   09/08/20 1800  Ampicillin-Sulbactam (UNASYN) 3 g in sodium chloride 0.9 % 100 mL IVPB        3 g 200 mL/hr over 30 Minutes Intravenous Every 8 hours 09/08/20 1314 09/09/20 1748   09/04/20 2000  acyclovir (ZOVIRAX) 640 mg in dextrose 5 % 100 mL IVPB  Status:  Discontinued        10 mg/kg  63.8 kg (Ideal) 112.8 mL/hr over 60 Minutes Intravenous Every 12 hours 09/04/20 0826 09/04/20 0907   09/04/20 1000  Ampicillin-Sulbactam (UNASYN) 3 g in sodium chloride 0.9 % 100 mL IVPB  Status:  Discontinued        3 g 200 mL/hr over 30 Minutes Intravenous Every 8 hours 09/04/20 0907 09/08/20 1315   09/03/20 2200  piperacillin-tazobactam (ZOSYN) IVPB 3.375 g  Status:  Discontinued        3.375 g 12.5 mL/hr over 240 Minutes Intravenous Every 8 hours 09/03/20 1858 09/03/20 1928   09/03/20 2002  vancomycin variable dose per unstable renal function (pharmacist dosing)  Status:  Discontinued         Does not apply See admin instructions 09/03/20 2002 09/04/20 0907   09/03/20 2000  acyclovir (ZOVIRAX) 1,000 mg in dextrose 5 % 150 mL IVPB  Status:  Discontinued        10 mg/kg  100 kg 170 mL/hr over 60 Minutes Intravenous Every 12 hours 09/03/20 1937 09/04/20 0826   09/03/20 1930  piperacillin-tazobactam (ZOSYN) IVPB 3.375 g  Status:  Discontinued        3.375 g 12.5 mL/hr over 240 Minutes Intravenous Every 8 hours 09/03/20 1928 09/04/20 0907   09/03/20 1815  ceFEPIme (MAXIPIME) 2 g in sodium chloride 0.9 % 100 mL IVPB  Status:  Discontinued        2 g 200 mL/hr over 30 Minutes Intravenous  Once 09/03/20 1801 09/03/20 1828   09/03/20 1815  metroNIDAZOLE (FLAGYL) IVPB 500 mg  Status:  Discontinued        500 mg 100 mL/hr over 60 Minutes Intravenous  Once 09/03/20 1801 09/03/20 1828   09/03/20 1815  vancomycin (VANCOCIN) IVPB 1000 mg/200 mL premix  Status:  Discontinued        1,000 mg 200 mL/hr over  60 Minutes Intravenous  Once 09/03/20 1801 09/03/20 1812   09/03/20 1815  vancomycin (VANCOREADY) IVPB 2000 mg/400 mL        2,000 mg 200 mL/hr over 120 Minutes Intravenous  Once 09/03/20 1812 09/03/20 2326  Subjective: Seen and examined at bedside and and still was complaining of significant left knee pain and was complained that he did not have his knee brace.  Slightly nauseous.  Denies chest pain, shortness of breath, lightheadedness or dizziness.  No other concerns or complaints at this time.  Objective: Vitals:   09/24/20 0921 09/24/20 2039 09/25/20 0548 09/25/20 1350  BP: (!) 127/96 102/75 132/87 126/85  Pulse: 91 79 79 77  Resp: 19 20 16 16   Temp: 98.7 F (37.1 C) 98.3 F (36.8 C) 97.8 F (36.6 C) 98 F (36.7 C)  TempSrc: Oral  Oral   SpO2: 98% 99% 100% 96%  Weight:      Height:        Intake/Output Summary (Last 24 hours) at 09/25/2020 1659 Last data filed at 09/25/2020 0548 Gross per 24 hour  Intake 240 ml  Output 475 ml  Net -235 ml   Filed Weights   09/22/20 0500 09/23/20 0500 09/24/20 0500  Weight: 78.2 kg 81.3 kg 78.6 kg   Examination: Physical Exam:  Constitutional: WN/WD African-American male, in no acute distress sitting in the chair bedside still complaining of some left knee pain Eyes: Lids and conjunctivae normal, sclerae anicteric  ENMT: External Ears, Nose appear normal. Grossly normal hearing Neck: Appears normal, supple, no cervical masses, normal ROM, no appreciable thyromegaly; no JVD Respiratory: Diminished to auscultation bilaterally with coarse breath sounds, no wheezing, rales, rhonchi or crackles. Normal respiratory effort and patient is not tachypenic. No accessory muscle use.  Unlabored breathing Cardiovascular: RRR, no murmurs / rubs / gallops. S1 and S2 auscultated.  Minimal extremity edema Abdomen: Soft, non-tender, mildly distended secondary to body habitus. Bowel sounds positive.  GU: Deferred. Musculoskeletal: No clubbing /  cyanosis of digits/nails. No joint deformity upper and lower extremities.  Skin: No rashes, lesions, ulcers on limited skin evaluation. No induration; Warm and dry.  Neurologic: CN 2-12 grossly intact with no focal deficits. Romberg sign and cerebellar reflexes not assessed.  Psychiatric: Normal judgment and insight. Alert and oriented x 3. Normal mood and appropriate affect.   Data Reviewed: I have personally reviewed following labs and imaging studies  CBC: Recent Labs  Lab 09/19/20 0229 09/22/20 0150 09/24/20 0257 09/25/20 0233  WBC 5.0 4.8 5.2 5.0  NEUTROABS  --   --   --  2.2  HGB 11.7* 11.7* 12.2* 11.9*  HCT 35.0* 37.2* 36.7* 38.2*  MCV 94.9 95.9 94.3 97.0  PLT 349 394 368 951   Basic Metabolic Panel: Recent Labs  Lab 09/19/20 0229 09/22/20 0150 09/24/20 0257 09/25/20 0233  NA 140 140 139 141  K 4.3 4.2 4.3 4.5  CL 110 111 107 107  CO2 18* 20* 22 24  GLUCOSE 98 107* 94 106*  BUN 10 8 11 13   CREATININE 1.30* 1.34* 1.07 1.21  CALCIUM 9.3 9.3 9.3 9.3  MG  --   --   --  1.6*  PHOS  --   --   --  3.5   GFR: Estimated Creatinine Clearance: 60.8 mL/min (by C-G formula based on SCr of 1.21 mg/dL). Liver Function Tests: Recent Labs  Lab 09/22/20 0150 09/25/20 0233  AST 39 36  ALT 46* 36  ALKPHOS 47 55  BILITOT 0.6 0.5  PROT 7.2 7.0  ALBUMIN 2.3* 2.5*   No results for input(s): LIPASE, AMYLASE in the last 168 hours. No results for input(s): AMMONIA in the last 168 hours. Coagulation Profile: No results for input(s): INR, PROTIME in the  last 168 hours. Cardiac Enzymes: No results for input(s): CKTOTAL, CKMB, CKMBINDEX, TROPONINI in the last 168 hours. BNP (last 3 results) No results for input(s): PROBNP in the last 8760 hours. HbA1C: No results for input(s): HGBA1C in the last 72 hours. CBG: Recent Labs  Lab 09/24/20 1609 09/24/20 2042 09/25/20 0732 09/25/20 1121 09/25/20 1630  GLUCAP 82 100* 104* 143* 236*   Lipid Profile: No results for input(s):  CHOL, HDL, LDLCALC, TRIG, CHOLHDL, LDLDIRECT in the last 72 hours. Thyroid Function Tests: No results for input(s): TSH, T4TOTAL, FREET4, T3FREE, THYROIDAB in the last 72 hours. Anemia Panel: No results for input(s): VITAMINB12, FOLATE, FERRITIN, TIBC, IRON, RETICCTPCT in the last 72 hours. Sepsis Labs: No results for input(s): PROCALCITON, LATICACIDVEN in the last 168 hours.  Recent Results (from the past 240 hour(s))  SARS CORONAVIRUS 2 (TAT 6-24 HRS) Nasopharyngeal Nasopharyngeal Swab     Status: Abnormal   Collection Time: 09/20/20 10:19 AM   Specimen: Nasopharyngeal Swab  Result Value Ref Range Status   SARS Coronavirus 2 POSITIVE (A) NEGATIVE Final    Comment: (NOTE) SARS-CoV-2 target nucleic acids are DETECTED.  The SARS-CoV-2 RNA is generally detectable in upper and lower respiratory specimens during the acute phase of infection. Positive results are indicative of the presence of SARS-CoV-2 RNA. Clinical correlation with patient history and other diagnostic information is  necessary to determine patient infection status. Positive results do not rule out bacterial infection or co-infection with other viruses.  The expected result is Negative.  Fact Sheet for Patients: SugarRoll.be  Fact Sheet for Healthcare Providers: https://www.woods-mathews.com/  This test is not yet approved or cleared by the [place] FDA and  has been authorized for detection and/or diagnosis of SARS-CoV-2 by FDA under an Emergency Use Authorization (EUA). This EUA will remain  in effect (meaning this test can be used) for the duration of the COVID-19 declaration under Section 564(b)(1) of the Act, 21 U. S.C. section 360bbb-3(b)(1), unless the authorization is terminated or revoked sooner.   Performed at [facility], [place] [address] [place], [place] [address]      RN Pressure Injury Documentation:     Estimated body mass index is 27.97  kg/m as calculated from the following:   Height as of this encounter: 5\' 6"  (1.676 m).   Weight as of this encounter: 78.6 kg.  Malnutrition Type:   Malnutrition Characteristics:   Nutrition Interventions:    Radiology Studies: DG Knee 1-2 Views Left  Result Date: 09/24/2020 CLINICAL DATA:  Chronic left knee pain without known injury. EXAM: LEFT KNEE - 1-2 VIEW COMPARISON:  None. FINDINGS: No evidence of fracture, dislocation, or joint effusion. Mild narrowing of medial and lateral joint spaces are noted with osteophyte formation. Soft tissues are unremarkable. IMPRESSION: Mild degenerative joint disease. No acute abnormality seen in the left knee. Electronically Signed   By: Marijo Conception M.D.   On: 09/24/2020 11:01   DG CHEST PORT 1 VIEW  Result Date: 09/24/2020 CLINICAL DATA:  Shortness of breath. EXAM: PORTABLE CHEST 1 VIEW COMPARISON:  September 18, 2020. FINDINGS: Stable cardiomediastinal silhouette. No pneumothorax or pleural effusion is noted. Stable patchy airspace opacities are noted consistent multifocal pneumonia. Bony thorax is unremarkable. IMPRESSION: Stable bilateral multifocal pneumonia. Electronically Signed   By: Marijo Conception M.D.   On: 09/24/2020 11:02    Scheduled Meds: . amLODipine  5 mg Oral Daily  . aspirin EC  81 mg Oral Daily  . Chlorhexidine Gluconate Cloth  6 each Topical Daily  . clotrimazole   Topical BID  . colchicine  0.6 mg Oral BID  . docusate sodium  100 mg Oral BID  . folic acid  1 mg Oral Daily  . heparin  5,000 Units Subcutaneous Q8H  . insulin aspart  0-9 Units Subcutaneous TID WC & HS  . metoprolol tartrate  25 mg Oral BID  . multivitamin with minerals  1 tablet Oral Daily  . pantoprazole  40 mg Oral QHS  . polyethylene glycol  17 g Oral Daily  . predniSONE  40 mg Oral Q breakfast  . thiamine  100 mg Oral Daily   Continuous Infusions:   LOS: 22 days   Kerney Elbe, DO [facility] PAGER is on AMION  If 7PM-7AM, please  contact night-coverage [email or web address]

## 2020-09-25 NOTE — TOC Progression Note (Signed)
Transition of Care (TOC) - Progression Note    Patient Details  Name: Todd [name]. MRN: [id number] Date of Birth: 08-02-1956  Transition of Care [facility] CM/SW Contact  Pollie Friar, RN Phone Number: 09/25/2020, 12:50 PM  Clinical Narrative:    Pt currently without bed offers. Cm has asked [facility] and [facility] to pleae review referral as they are in network with  pts insurance.  CM has also faxed him out further for potential bed offer.  TOC following.  Expected Discharge Plan: Homeless Shelter Barriers to Discharge: Active Substance Use - Placement,ED Unsafe disposition,Homeless with medical needs  Expected Discharge Plan and Services Expected Discharge Plan: Homeless Shelter In-house Referral: NA Discharge Planning Services: CM Consult   Living arrangements for the past 2 months: Homeless                 DME Arranged: N/A                     Social Determinants of Health (SDOH) Interventions    Readmission Risk Interventions Readmission Risk Prevention Plan 09/16/2020  Transportation Screening Complete  PCP or Specialist Appt within 5-7 Days Complete  Home Care Screening Complete  Medication Review (RN CM) Referral to Pharmacy  Some recent data might be hidden

## 2020-09-25 NOTE — Progress Notes (Signed)
Orthopedic Tech Progress Note Patient Details:  Todd [name] 16-Jul-1956 [id number]  Ortho Devices Type of Ortho Device: Knee Sleeve Ortho Device/Splint Location: LLE Ortho Device/Splint Interventions: Ordered,Application   Post Interventions Patient Tolerated: Well Instructions Provided: Care of device,Adjustment of device,Poper ambulation with device   Todd [name] 09/25/2020, 3:15 PM

## 2020-09-25 NOTE — Progress Notes (Signed)
OT Cancellation Note  Patient Details Name: Alante Tolan. MRN: [id number] DOB: 1955-09-08   Cancelled Treatment:    Reason Eval/Treat Not Completed: Patient declined, no reason specified. On entry, pt laying flat in bed eating lunch. Offered to assist pt to chair to eat lunch to maximize safe positioning for eating. Pt adamantly declined all OOB/EOB activities due to knee pain. Pt requested OT to come back tomorrow. Educated pt that this was 3rd consecutive OT refusal and would have to discharge services if pt refused again today, but PT may be able to attempt tomorrow. Pt reports "it doesn't matter to me either way". OT to sign off. Please reconsult if pt more willing to consistently participate in OT services.   Layla Maw 09/25/2020, 2:33 PM

## 2020-09-26 LAB — CBC WITH DIFFERENTIAL/PLATELET
Abs Immature Granulocytes: 0.12 10*3/uL — ABNORMAL HIGH (ref 0.00–0.07)
Basophils Absolute: 0 10*3/uL (ref 0.0–0.1)
Basophils Relative: 0 %
Eosinophils Absolute: 0 10*3/uL (ref 0.0–0.5)
Eosinophils Relative: 0 %
HCT: 36.5 % — ABNORMAL LOW (ref 39.0–52.0)
Hemoglobin: 11.9 g/dL — ABNORMAL LOW (ref 13.0–17.0)
Immature Granulocytes: 1 %
Lymphocytes Relative: 21 %
Lymphs Abs: 2.5 10*3/uL (ref 0.7–4.0)
MCH: 31.1 pg (ref 26.0–34.0)
MCHC: 32.6 g/dL (ref 30.0–36.0)
MCV: 95.3 fL (ref 80.0–100.0)
Monocytes Absolute: 1.1 10*3/uL — ABNORMAL HIGH (ref 0.1–1.0)
Monocytes Relative: 10 %
Neutro Abs: 7.7 10*3/uL (ref 1.7–7.7)
Neutrophils Relative %: 68 %
Platelets: 351 10*3/uL (ref 150–400)
RBC: 3.83 MIL/uL — ABNORMAL LOW (ref 4.22–5.81)
RDW: 14.3 % (ref 11.5–15.5)
WBC: 11.5 10*3/uL — ABNORMAL HIGH (ref 4.0–10.5)
nRBC: 0 % (ref 0.0–0.2)

## 2020-09-26 LAB — COMPREHENSIVE METABOLIC PANEL
ALT: 30 U/L (ref 0–44)
AST: 27 U/L (ref 15–41)
Albumin: 2.5 g/dL — ABNORMAL LOW (ref 3.5–5.0)
Alkaline Phosphatase: 51 U/L (ref 38–126)
Anion gap: 9 (ref 5–15)
BUN: 17 mg/dL (ref 8–23)
CO2: 23 mmol/L (ref 22–32)
Calcium: 9.6 mg/dL (ref 8.9–10.3)
Chloride: 107 mmol/L (ref 98–111)
Creatinine, Ser: 1.19 mg/dL (ref 0.61–1.24)
GFR, Estimated: >60 mL/min (ref >60)
Glucose, Bld: 166 mg/dL — ABNORMAL HIGH (ref 70–99)
Potassium: 5 mmol/L (ref 3.5–5.1)
Sodium: 139 mmol/L (ref 135–145)
Total Bilirubin: 0.4 mg/dL (ref 0.3–1.2)
Total Protein: 6.9 g/dL (ref 6.5–8.1)

## 2020-09-26 LAB — GLUCOSE, CAPILLARY
Glucose-Capillary: 101 mg/dL — ABNORMAL HIGH (ref 70–99)
Glucose-Capillary: 209 mg/dL — ABNORMAL HIGH (ref 70–99)
Glucose-Capillary: 254 mg/dL — ABNORMAL HIGH (ref 70–99)
Glucose-Capillary: 141 mg/dL — ABNORMAL HIGH (ref 70–99)

## 2020-09-26 LAB — URIC ACID: Uric Acid, Serum: 9 mg/dL — ABNORMAL HIGH (ref 3.7–8.6)

## 2020-09-26 LAB — MAGNESIUM: Magnesium: 1.9 mg/dL (ref 1.7–2.4)

## 2020-09-26 LAB — PHOSPHORUS: Phosphorus: 2.1 mg/dL — ABNORMAL LOW (ref 2.5–4.6)

## 2020-09-26 MED ORDER — POTASSIUM & SODIUM PHOSPHATES 280-160-250 MG PO PACK: 1.0000 | PACK | Freq: Three times a day (TID) | ORAL | Status: DC

## 2020-09-26 MED ORDER — K PHOS MONO-SOD PHOS DI & MONO 155-852-130 MG PO TABS
250.0000 mg | ORAL_TABLET | Freq: Three times a day (TID) | ORAL | Status: DC
  Administered 2020-09-26 – 2020-09-27 (×5): 250 mg via ORAL
  Filled 2020-09-26 (×4): qty 1

## 2020-09-26 MED ORDER — KETOROLAC TROMETHAMINE 15 MG/ML IJ SOLN
15.0000 mg | Freq: Once | INTRAMUSCULAR | Status: AC
  Administered 2020-09-26: 15 mg via INTRAVENOUS
  Filled 2020-09-26: qty 1

## 2020-09-26 MED ORDER — SERTRALINE HCL 25 MG PO TABS
25.0000 mg | ORAL_TABLET | Freq: Every day | ORAL | Status: DC
  Administered 2020-09-26 – 2020-09-29 (×4): 25 mg via ORAL
  Filled 2020-09-26 (×4): qty 1

## 2020-09-26 MED ORDER — FENTANYL CITRATE (PF) 100 MCG/2ML IJ SOLN: 12.5000 ug | Freq: Four times a day (QID) | INTRAMUSCULAR | Status: DC | PRN

## 2020-09-26 NOTE — TOC Progression Note (Signed)
Transition of Care (TOC) - Progression Note    Patient Details  Name: Todd [name]. MRN: [id number] Date of Birth: 1956-07-07  Transition [facility] Care [facility]) CM/SW Contact  Pollie Friar, RN Phone Number: 09/26/2020, 4:55 PM  Clinical Narrative:    Pt has been offered a bed at [facility] for tomorrow. CM has faxed clinical information to [place] per [place] with Raylene Everts request: [phone].  Pt informed of bed offer and he needs to talk with his aunt in the am. He also asked to see if other offers come in. CM encouraged him to accept as this is his only offer. He asked that CSW follow up with him in am.  TOC following.   Expected Discharge Plan: Homeless Shelter Barriers to Discharge: Active Substance Use - Placement,ED Unsafe disposition,Homeless with medical needs  Expected Discharge Plan and Services Expected Discharge Plan: Homeless Shelter In-house Referral: NA Discharge Planning Services: CM Consult   Living arrangements for the past 2 months: Homeless                 DME Arranged: N/A                     Social Determinants of Health (SDOH) Interventions    Readmission Risk Interventions Readmission Risk Prevention Plan 09/16/2020  Transportation Screening Complete  PCP or Specialist Appt within 5-7 Days Complete  Home Care Screening Complete  Medication Review (RN CM) Referral to Pharmacy  Some recent data might be hidden

## 2020-09-26 NOTE — Progress Notes (Signed)
PT Cancellation Note  Patient Details Name: Todd [name]. MRN: [id number] DOB: 25-Dec-1955   Cancelled Treatment:    Reason Eval/Treat Not Completed: Other (comment) (Refused stating he just got a shot in his left knee.)   Denice Paradise 09/26/2020, 12:59 PM Christyanna Mckeon W,PT Acute Rehabilitation Services Pager:  [phone]  Office:  [phone]

## 2020-09-26 NOTE — Plan of Care (Signed)

## 2020-09-26 NOTE — Progress Notes (Signed)
PROGRESS NOTE    Todd [name].  KNL:[id number] DOB: 06/05/1956 DOA: 09/03/2020 PCP: [name], MD   Brief Narrative:  Patient is a 65 year old African-American male with a past medical history significant for but not limited to diabetes mellitus type 2, hypertension, CKD stage IIIa, alcohol and tobacco abuse overall history of heroin abuse who presented from his homeless shelter when he was found down.  He was intubated in the ER under suspicion for seizure-like activity after intubation.  He was also found to be COVID-19 positive but was not treated with remdesivir or steroids at that time as symptoms were felt to be from aspiration.  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  EEG was done and showed diffuse encephalopathy.  LP was also done and was negative for meningitis.  He initially received IV vancomycin, IV Zosyn and IV acyclovir which was later transitioned to Unasyn for aspiration pneumonia.  He was seen by cardiology due to elevated troponin and they recommended outpatient ischemic work-up.  Also seen by GI due to severe transaminitis and advised to continue to monitor.  LFTs have improved and almost resolved.  PT OT recommending SNF.  He is deemed stable to be discharged wanted to go back to the [facility] however they requested a negative Covid test which was unreasonable and because his repeat Covid test on Saturday was still positive they discharged the patient from [facility] and he will need to go to SNF now that he longer has a bed available at [facility].  Transitions of care assisting with placement.  Patient continues to complain of left knee pain and apparently did not have a brace with this was ordered.  He likely has gout given his elevated uric acid level so we will start him on prednisone as well as colchicine.  Is given a one-time dose of IV ketorolac yesterday and will be given again  today.  Since his pain is uncontrolled we will start low-dose fentanyl IV as needed.   Assessment & Plan:   Active Problems:   Acute respiratory failure with hypoxia (HCC)  Acute hypoxic respiratory failure due to COVID-19 infection, aspiration pneumonia -Initially admitted to the ICU and was intubated in the ED as he was found down.  Successfully extubated on 1/20, currently on room air.  Due to bilateral patchy infiltrates and recent fevers, elevated procalcitonin he completed 5 days of ceftriaxone and azithromycin. He initially tested positive for 1/19, given respiratory involvement will need to be isolated for 21 days up until 2/9.  Did not receive Remdesivir or steroids on admission.  -Remains on room air and respiratory status is stable but has been having a slight cough -Repeat CXR ordered and showed "Stable cardiomediastinal silhouette. No pneumothorax or pleural effusion is noted. Stable patchy airspace opacities are noted consistent multifocal pneumonia. Bony thorax is unremarkable." -SpO2: 96 % FiO2 (%): 40 % -Continue to Monitor Respiratory Status carefully and is currently on room air -We will need an ambulatory home O2 screen prior to discharge;   Acute Metabolic encephalopathy, unresponsive episode, improved  -there were initial concerns for acute meningitis/encephalitis.  He was initially on broad-spectrum antibiotics with vancomycin, Zosyn, acyclovir, underwent an LP which was negative.   -Antibiotics were discontinued.  He underwent an EEG which showed diffuse encephalopathy without seizures, and UDS was negative.  Mental status returned to baseline  Elevated Troponin -2D echo showed EF 79%, grade 1 diastolic dysfunction.   -  Cardiology saw him and recommended for outpatient ischemic evaluation on discharge  Possible Balanitis, candidal infection -Started clotrimazole 1% twice daily for 7 days on 2/5.  Improving  Osteoarthritis, chronic back pain, chronic left knee  pain and now acute pain in the setting of likely gout -Patient got a brace for his back, left knee sleeve -Repeat Knee X-Ray obtained Mild degenerative joint disease. No acute abnormality seen in the left knee -Will obtain Uric Acid Level to evaluate for Gout in the AM; uric acid level was elevated at 9.1 and today was 9.0 -Given 1x of IV Fentanyl the day before yesterday but will start 12.5 mcg q6hprn  -Start him on gout treatment with prednisone 40 mill daily, colchicine 0.6 mg twice daily as well as a one-time dose of IV Ketorolac 15 mg yesterday and will give it again today   Acute kidney injury on chronic kidney disease stage IIIa -Creatinine at baseline and today his BUN/creatinine is now 17/1.19 -Given a one-time dose of IV ketorolac 15 mg x 1 so we will need to monitor renal function carefully -Avoid nephrotoxic medications, contrast dyes, hypotension and renally dose medications  -repeat CMP in a.m.  Transaminitis, alcohol pattern, history of hep C -significant elevated liver enzymes on admission, concern for shock liver versus ischemic hepatitis versus metabolic hepatitis.  GI consulted and evaluated patient.  LFTs normalized as AST is now 27 and ALT is 30  Mild Rhabdomyolysis -Resolved with IV fluids  Diarrhea -C. difficile negative, possibly due to COVID-19 -Continue to monitor and trend as this is improving   Microcytic Anemia -Suspect from underlying alcohol use. Hemoglobin stable and is actually improved.  Now hemoglobin/hematocrit is 12.2/36.7 yesterday and today it is 11.9/36.5 -Repeat CBC in the a.m. 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  He was on Coumadin in the past -Continue to Monitor   History of Dilated Aorta -Outpatient follow-up with cardiothoracic surgery  Depression -Longstanding -Resume Sertraline 25 mg po Daily   UTI with sepsis -Was on Ceftriaxone and stopped   Disposition-Was Stable to be discharged, however does not want SNF and wants to go back to [facility]. Unfortunately [facility] has an unreasonable request for patient to test negative prior to his return. He just tested positive again on 2/5. They are not going to hold his bed anymore, and they brought his belongings to the Hospital  HTN -C/w Amlodipine 5 mg po Daily   DVT prophylaxis: Heparin 5,000 units sq q8h Code Status: FULL CODE  Family Communication: No family present at bedside  Disposition Plan: SNF when bed available as currently has no bed offers any is homeless  Status is: Inpatient  Remains inpatient appropriate because:Unsafe d/c plan, IV treatments appropriate due to intensity of illness or inability to take PO and Inpatient level of care appropriate due to severity of illness   Dispo: The patient is from: Home  Anticipated d/c is to: SNF              Anticipated d/c date is: 2 days              Patient currently is medically stable to d/c.   Difficult to place patient No not at the moment  Consultants:   None   Procedures: Intubated on 09/02/2020 and then extubated on 09/04/2020  Antimicrobials:  Anti-infectives (From admission, onward)   Start     Dose/Rate Route Frequency Ordered Stop   09/17/20 1800  azithromycin (ZITHROMAX) tablet 500 mg        500 mg Oral Daily-1800 09/17/20 0852 09/19/20 1758   09/15/20 1800  azithromycin  (ZITHROMAX) 500 mg in sodium chloride 0.9 % 250 mL IVPB  Status:  Discontinued        500 mg 250 mL/hr over 60 Minutes Intravenous Every 24 hours 09/15/20 1642 09/17/20 0852   09/15/20 1730  cefTRIAXone (ROCEPHIN) 2 g in sodium chloride 0.9 % 100 mL IVPB        2 g 200 mL/hr over 30 Minutes Intravenous Every 24 hours 09/15/20 1642 09/19/20 1748   09/15/20 1715  cefTRIAXone (ROCEPHIN) 1 g in sodium chloride 0.9 % 100 mL IVPB  Status:  Discontinued        1 g 200 mL/hr over 30 Minutes Intravenous Every 24 hours 09/15/20 1625 09/15/20 1642   09/08/20 1800  Ampicillin-Sulbactam (UNASYN) 3 g in sodium chloride 0.9 % 100 mL IVPB        3 g 200 mL/hr over 30 Minutes Intravenous Every 8 hours 09/08/20 1314 09/09/20 1748   09/04/20 2000  acyclovir (ZOVIRAX) 640 mg in dextrose 5 % 100 mL IVPB  Status:  Discontinued        10 mg/kg  63.8 kg (Ideal) 112.8 mL/hr over 60 Minutes Intravenous Every 12 hours 09/04/20 0826 09/04/20 0907   09/04/20 1000  Ampicillin-Sulbactam (UNASYN) 3 g in sodium chloride 0.9 % 100 mL IVPB  Status:  Discontinued        3 g 200 mL/hr over 30 Minutes Intravenous Every 8 hours 09/04/20 0907 09/08/20 1315   09/03/20 2200  piperacillin-tazobactam (ZOSYN) IVPB 3.375 g  Status:  Discontinued        3.375 g 12.5 mL/hr over 240 Minutes Intravenous Every 8 hours 09/03/20 1858 09/03/20 1928   09/03/20 2002  vancomycin variable dose per unstable renal function (pharmacist dosing)  Status:  Discontinued         Does not apply See admin instructions 09/03/20 2002 09/04/20 0907   09/03/20 2000  acyclovir (ZOVIRAX) 1,000 mg in dextrose 5 % 150 mL IVPB  Status:  Discontinued        10 mg/kg  100 kg 170 mL/hr over 60 Minutes Intravenous Every 12 hours 09/03/20 1937 09/04/20 0826   09/03/20 1930  piperacillin-tazobactam (ZOSYN) IVPB 3.375 g  Status:  Discontinued        3.375 g 12.5 mL/hr over 240 Minutes Intravenous Every 8 hours 09/03/20 1928 09/04/20 0907   09/03/20 1815  ceFEPIme  (MAXIPIME) 2 g in sodium chloride 0.9 % 100 mL IVPB  Status:  Discontinued        2 g 200 mL/hr over 30 Minutes Intravenous  Once 09/03/20 1801 09/03/20 1828   09/03/20 1815  metroNIDAZOLE (FLAGYL) IVPB 500 mg  Status:  Discontinued        500 mg 100 mL/hr over 60 Minutes Intravenous  Once 09/03/20 1801 09/03/20 1828  09/03/20 1815  vancomycin (VANCOCIN) IVPB 1000 mg/200 mL premix  Status:  Discontinued        1,000 mg 200 mL/hr over 60 Minutes Intravenous  Once 09/03/20 1801 09/03/20 1812   09/03/20 1815  vancomycin (VANCOREADY) IVPB 2000 mg/400 mL        2,000 mg 200 mL/hr over 120 Minutes Intravenous  Once 09/03/20 1812 09/03/20 2326        Subjective: Seen and examined at bedside and his main complaint was significant left-sided knee pain still.  Was little irritated as his knee brace was too big.  Also complaining about his pain medication regimen given that he continues to have significant amount of pain.  States his pain is a little bit improved from yesterday.  Has not been ambulating very much per nursing.  Also admits to having longstanding depression and wanting his depression medications reinitiated.  No other concerns or complaints at this time  Objective: Vitals:   09/25/20 2135 09/25/20 2136 09/26/20 0628 09/26/20 1409  BP: 136/86 136/86 (!) 138/91 132/82  Pulse:  80 72 82  Resp:  20 20 16   Temp:  98.7 F (37.1 C) 98.3 F (36.8 C) 97.9 F (36.6 C)  TempSrc:      SpO2:  98% 92% 96%  Weight:      Height:       No intake or output data in the 24 hours ending 09/26/20 1542 Filed Weights   09/22/20 0500 09/23/20 0500 09/24/20 0500  Weight: 78.2 kg 81.3 kg 78.6 kg   Examination: Physical Exam:  Constitutional: WN/WD African-American male currently in mild distress, still complaining of some knee pain and appears frustrated Eyes: Lids and conjunctivae normal, sclerae anicteric  ENMT: External Ears, Nose appear normal. Grossly normal hearing.  Neck: Appears  normal, supple, no cervical masses, normal ROM, no appreciable thyromegaly; no JVD Respiratory: Diminished to auscultation bilaterally, no wheezing, rales, rhonchi or crackles. Normal respiratory effort and patient is not tachypenic. No accessory muscle use.  Unlabored Cardiovascular: RRR, no murmurs / rubs / gallops. S1 and S2 auscultated.  17.  Minimal extremity edema Abdomen: Soft, non-tender, non-distended. No masses palpated. No appreciable hepatosplenomegaly. Bowel sounds positive.  GU: Deferred. Musculoskeletal: No clubbing / cyanosis of digits/nails. No joint deformity upper and lower extremities. Skin: No rashes, lesions, ulcers on a limited skin evaluation. No induration; Warm and dry.  Neurologic: CN 2-12 grossly intact with no focal deficits. Romberg sign and cerebellar reflexes not assessed.  Psychiatric: Normal judgment and insight. Alert and oriented x 3. Frustrated and slightly anxious mood and appropriate affect.   Data Reviewed: I have personally reviewed following labs and imaging studies  CBC: Recent Labs  Lab 09/22/20 0150 09/24/20 0257 09/25/20 0233 09/26/20 0305  WBC 4.8 5.2 5.0 11.5*  NEUTROABS  --   --  2.2 7.7  HGB 11.7* 12.2* 11.9* 11.9*  HCT 37.2* 36.7* 38.2* 36.5*  MCV 95.9 94.3 97.0 95.3  PLT 394 368 368 253   Basic Metabolic Panel: Recent Labs  Lab 09/22/20 0150 09/24/20 0257 09/25/20 0233 09/26/20 0305  NA 140 139 141 139  K 4.2 4.3 4.5 5.0  CL 111 107 107 107  CO2 20* 22 24 23   GLUCOSE 107* 94 106* 166*  BUN 8 11 13 17   CREATININE 1.34* 1.07 1.21 1.19  CALCIUM 9.3 9.3 9.3 9.6  MG  --   --  1.6* 1.9  PHOS  --   --  3.5 2.1*   GFR: Estimated  Creatinine Clearance: 61.8 mL/min (by C-G formula based on SCr of 1.19 mg/dL). Liver Function Tests: Recent Labs  Lab 09/22/20 0150 09/25/20 0233 09/26/20 0305  AST 39 36 27  ALT 46* 36 30  ALKPHOS 47 55 51  BILITOT 0.6 0.5 0.4  PROT 7.2 7.0 6.9  ALBUMIN 2.3* 2.5* 2.5*   No results for  input(s): LIPASE, AMYLASE in the last 168 hours. No results for input(s): AMMONIA in the last 168 hours. Coagulation Profile: No results for input(s): INR, PROTIME in the last 168 hours. Cardiac Enzymes: No results for input(s): CKTOTAL, CKMB, CKMBINDEX, TROPONINI in the last 168 hours. BNP (last 3 results) No results for input(s): PROBNP in the last 8760 hours. HbA1C: No results for input(s): HGBA1C in the last 72 hours. CBG: Recent Labs  Lab 09/25/20 1121 09/25/20 1630 09/25/20 2135 09/26/20 0723 09/26/20 1141  GLUCAP 143* 236* 205* 101* 209*   Lipid Profile: No results for input(s): CHOL, HDL, LDLCALC, TRIG, CHOLHDL, LDLDIRECT in the last 72 hours. Thyroid Function Tests: No results for input(s): TSH, T4TOTAL, FREET4, T3FREE, THYROIDAB in the last 72 hours. Anemia Panel: No results for input(s): VITAMINB12, FOLATE, FERRITIN, TIBC, IRON, RETICCTPCT in the last 72 hours. Sepsis Labs: No results for input(s): PROCALCITON, LATICACIDVEN in the last 168 hours.  Recent Results (from the past 240 hour(s))  SARS CORONAVIRUS 2 (TAT 6-24 HRS) Nasopharyngeal Nasopharyngeal Swab     Status: Abnormal   Collection Time: 09/20/20 10:19 AM   Specimen: Nasopharyngeal Swab  Result Value Ref Range Status   SARS Coronavirus 2 POSITIVE (A) NEGATIVE Final    Comment: (NOTE) SARS-CoV-2 target nucleic acids are DETECTED.  The SARS-CoV-2 RNA is generally detectable in upper and lower respiratory specimens during the acute phase of infection. Positive results are indicative of the presence of SARS-CoV-2 RNA. Clinical correlation with patient history and other diagnostic information is  necessary to determine patient infection status. Positive results do not rule out bacterial infection or co-infection with other viruses.  The expected result is Negative.  Fact Sheet for Patients: SugarRoll.be  Fact Sheet for Healthcare  Providers: https://www.woods-mathews.com/  This test is not yet approved or cleared by the [place] FDA and  has been authorized for detection and/or diagnosis of SARS-CoV-2 by FDA under an Emergency Use Authorization (EUA). This EUA will remain  in effect (meaning this test can be used) for the duration of the COVID-19 declaration under Section 564(b)(1) of the Act, 21 U. S.C. section 360bbb-3(b)(1), unless the authorization is terminated or revoked sooner.   Performed at [facility], [place] [address] [place], [place] [address]      RN Pressure Injury Documentation:     Estimated body mass index is 27.97 kg/m as calculated from the following:   Height as of this encounter: 5\' 6"  (1.676 m).   Weight as of this encounter: 78.6 kg.  Malnutrition Type:   Malnutrition Characteristics:   Nutrition Interventions:    Radiology Studies: No results found.  Scheduled Meds: . amLODipine  5 mg Oral Daily  . aspirin EC  81 mg Oral Daily  . Chlorhexidine Gluconate Cloth  6 each Topical Daily  . clotrimazole   Topical BID  . colchicine  0.6 mg Oral BID  . docusate sodium  100 mg Oral BID  . folic acid  1 mg Oral Daily  . heparin  5,000 Units Subcutaneous Q8H  . insulin aspart  0-9 Units Subcutaneous TID WC & HS  . metoprolol tartrate  25 mg Oral BID  . multivitamin with minerals  1 tablet Oral Daily  . pantoprazole  40 mg Oral QHS  . phosphorus  250 mg Oral TID AC & HS  . polyethylene glycol  17 g Oral Daily  . predniSONE  40 mg Oral Q breakfast  . sertraline  25 mg Oral Daily  . thiamine  100 mg Oral Daily   Continuous Infusions:   LOS: 23 days   Kerney Elbe, DO [facility] PAGER is on AMION  If 7PM-7AM, please contact night-coverage [email or web address]

## 2020-09-27 LAB — CBC WITH DIFFERENTIAL/PLATELET
Abs Immature Granulocytes: 0.14 10*3/uL — ABNORMAL HIGH (ref 0.00–0.07)
Basophils Absolute: 0.1 10*3/uL (ref 0.0–0.1)
Basophils Relative: 0 %
Eosinophils Absolute: 0.1 10*3/uL (ref 0.0–0.5)
Eosinophils Relative: 1 %
HCT: 36.5 % — ABNORMAL LOW (ref 39.0–52.0)
Hemoglobin: 11.8 g/dL — ABNORMAL LOW (ref 13.0–17.0)
Immature Granulocytes: 1 %
Lymphocytes Relative: 24 %
Lymphs Abs: 3.1 10*3/uL (ref 0.7–4.0)
MCH: 31.1 pg (ref 26.0–34.0)
MCHC: 32.3 g/dL (ref 30.0–36.0)
MCV: 96.3 fL (ref 80.0–100.0)
Monocytes Absolute: 1.1 10*3/uL — ABNORMAL HIGH (ref 0.1–1.0)
Monocytes Relative: 9 %
Neutro Abs: 8.5 10*3/uL — ABNORMAL HIGH (ref 1.7–7.7)
Neutrophils Relative %: 65 %
Platelets: 348 10*3/uL (ref 150–400)
RBC: 3.79 MIL/uL — ABNORMAL LOW (ref 4.22–5.81)
RDW: 14.6 % (ref 11.5–15.5)
WBC: 13 10*3/uL — ABNORMAL HIGH (ref 4.0–10.5)
nRBC: 0 % (ref 0.0–0.2)

## 2020-09-27 LAB — COMPREHENSIVE METABOLIC PANEL
ALT: 33 U/L (ref 0–44)
AST: 32 U/L (ref 15–41)
Albumin: 2.6 g/dL — ABNORMAL LOW (ref 3.5–5.0)
Alkaline Phosphatase: 47 U/L (ref 38–126)
Anion gap: 11 (ref 5–15)
BUN: 20 mg/dL (ref 8–23)
CO2: 24 mmol/L (ref 22–32)
Calcium: 9.4 mg/dL (ref 8.9–10.3)
Chloride: 105 mmol/L (ref 98–111)
Creatinine, Ser: 1.17 mg/dL (ref 0.61–1.24)
GFR, Estimated: >60 mL/min (ref >60)
Glucose, Bld: 123 mg/dL — ABNORMAL HIGH (ref 70–99)
Potassium: 4.4 mmol/L (ref 3.5–5.1)
Sodium: 140 mmol/L (ref 135–145)
Total Bilirubin: 0.4 mg/dL (ref 0.3–1.2)
Total Protein: 6.8 g/dL (ref 6.5–8.1)

## 2020-09-27 LAB — URIC ACID: Uric Acid, Serum: 9.2 mg/dL — ABNORMAL HIGH (ref 3.7–8.6)

## 2020-09-27 LAB — GLUCOSE, CAPILLARY
Glucose-Capillary: 129 mg/dL — ABNORMAL HIGH (ref 70–99)
Glucose-Capillary: 160 mg/dL — ABNORMAL HIGH (ref 70–99)
Glucose-Capillary: 196 mg/dL — ABNORMAL HIGH (ref 70–99)
Glucose-Capillary: 161 mg/dL — ABNORMAL HIGH (ref 70–99)

## 2020-09-27 LAB — PHOSPHORUS: Phosphorus: 3.2 mg/dL (ref 2.5–4.6)

## 2020-09-27 LAB — MAGNESIUM: Magnesium: 1.6 mg/dL — ABNORMAL LOW (ref 1.7–2.4)

## 2020-09-27 MED ORDER — MAGNESIUM SULFATE 2 GM/50ML IV SOLN
2.0000 g | Freq: Once | INTRAVENOUS | Status: AC
  Administered 2020-09-27: 2 g via INTRAVENOUS
  Filled 2020-09-27: qty 50

## 2020-09-27 MED ORDER — PREDNISONE 20 MG PO TABS
60.0000 mg | ORAL_TABLET | Freq: Every day | ORAL | Status: DC
  Administered 2020-09-28 – 2020-09-29 (×2): 60 mg via ORAL
  Filled 2020-09-27 (×2): qty 3

## 2020-09-27 NOTE — Progress Notes (Signed)
Orthopedic Tech Progress Note Patient Details:  Todd [name] 04-Sep-1955 [id number]  Ortho Devices Type of Ortho Device: Knee Sleeve Ortho Device/Splint Location: LLE Ortho Device/Splint Interventions: Ordered,Application   Post Interventions Patient Tolerated: Well Instructions Provided: Care of device,Adjustment of device,Poper ambulation with device   Pilot Prindle 09/27/2020, 3:04 PM

## 2020-09-27 NOTE — TOC Progression Note (Signed)
Transition of Care (TOC) - Progression Note    Patient Details  Name: Jadier Rockers. MRN: [id number] Date of Birth: 1956/03/19  Transition of Care [facility]) CM/SW [place], [place] Phone Number: 09/27/2020, 4:50 PM  Clinical Narrative:    CSW spoke with pt at bedside regarding SNF placement. Pt only has one bed offer and this has been explained to him many times. He gets mad when we have to tell him more and more firmly there are no other options. He wanted facility to call him, but they were not able, so CSW asked about their smoking and day trip policy. CSW informed him there was absolutely no smoking on the premises, and that they need a 48 hour notice for leaving for the day. Pt stated he did not like these parameters and that CSW couldn't possibly know all of his questions. He then said we could not DC him on Sunday as it was "not righteous". At this time pt cannot go back to the shelter, but is refusing to make any DC decisions. MD was notified, and SW will address again in the morning. SW will continue to follow for DC.  Expected Discharge Plan: Homeless Shelter Barriers to Discharge: Active Substance Use - Placement,ED Unsafe disposition,Homeless with medical needs  Expected Discharge Plan and Services Expected Discharge Plan: Homeless Shelter In-house Referral: NA Discharge Planning Services: CM Consult   Living arrangements for the past 2 months: Homeless                 DME Arranged: N/A                     Social Determinants of Health (SDOH) Interventions    Readmission Risk Interventions Readmission Risk Prevention Plan 09/16/2020  Transportation Screening Complete  PCP or Specialist Appt within 5-7 Days Complete  Home Care Screening Complete  Medication Review (RN CM) Referral to Pharmacy  Some recent data might be hidden

## 2020-09-27 NOTE — Progress Notes (Signed)
PROGRESS NOTE    Todd [name].  CBS:[id number] DOB: 11-Jul-1956 DOA: 09/03/2020 PCP: [name], MD   Brief Narrative:  Todd [name] is a 65 year old African-American male with a past medical history significant for but not limited to diabetes mellitus type 2, hypertension, CKD stage IIIa, alcohol and tobacco abuse overall history of heroin abuse who presented from his homeless shelter when he was found down.  He was intubated in the ER under suspicion for seizure-like activity after intubation.  He was also found to be COVID-19 positive but was not treated with remdesivir or steroids at that time as symptoms were felt to be from aspiration.  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  EEG was done and showed diffuse encephalopathy.  LP was also done and was negative for meningitis.  He initially received IV vancomycin, IV Zosyn and IV acyclovir which was later transitioned to Unasyn for aspiration pneumonia.  He was seen by cardiology due to elevated troponin and they recommended outpatient ischemic work-up.  Also seen by GI due to severe transaminitis and advised to continue to monitor.  LFTs have improved and almost resolved.  PT OT recommending SNF.  He is deemed stable to be discharged wanted to go back to the [facility] however they requested a negative Covid test which was unreasonable and because his repeat Covid test on Saturday was still positive they discharged the Todd [name] from [facility] and he will need to go to SNF now that he longer has a bed available at [facility].  Transitions of care assisting with placement.  Todd [name] continues to complain of left knee pain and apparently did not have a brace with this was ordered.  He likely has gout given his elevated uric acid level so we will start him on prednisone as well as colchicine.  Is given a one-time dose of IV ketorolac yesterday and will be given again  today.  Since his pain is uncontrolled we will start low-dose fentanyl IV as needed.   Assessment & Plan:   Active Problems:   Acute respiratory failure with hypoxia (HCC)  Acute hypoxic respiratory failure due to COVID-19 infection, aspiration pneumonia -Initially admitted to the ICU and was intubated in the ED as he was found down.  Successfully extubated on 1/20, currently on room air.  Due to bilateral patchy infiltrates and recent fevers, elevated procalcitonin he completed 5 days of ceftriaxone and azithromycin. He initially tested positive for 1/19, given respiratory involvement will need to be isolated for 21 days up until 2/9.  Did not receive Remdesivir or steroids on admission.  -Remains on room air and respiratory status is stable but has been having a slight cough -Repeat CXR ordered and showed "Stable cardiomediastinal silhouette. No pneumothorax or pleural effusion is noted. Stable patchy airspace opacities are noted consistent multifocal pneumonia. Bony thorax is unremarkable." -SpO2: 97 % FiO2 (%): 40 % -Continue to Monitor Respiratory Status carefully and is currently on room air -We will need an ambulatory home O2 screen prior to discharge;   Acute Metabolic encephalopathy, unresponsive episode, improved  -there were initial concerns for acute meningitis/encephalitis.  He was initially on broad-spectrum antibiotics with vancomycin, Zosyn, acyclovir, underwent an LP which was negative.   -Antibiotics were discontinued.  He underwent an EEG which showed diffuse encephalopathy without seizures, and UDS was negative.  Mental status returned to baseline  Elevated Troponin -2D echo showed EF 84%, grade 1 diastolic dysfunction.   -  Cardiology saw him and recommended for outpatient ischemic evaluation on discharge  Possible Balanitis, candidal infection -Started clotrimazole 1% twice daily for 7 days on 2/5.  Improving  Osteoarthritis, chronic back pain, chronic left knee  pain and now acute pain in the setting of likely gout -Todd [name] got a brace for his back, left knee sleeve -Repeat Knee X-Ray obtained Mild degenerative joint disease. No acute abnormality seen in the left knee -Will obtain Uric Acid Level to evaluate for Gout in the AM; uric acid level was elevated at 9.1 and yestrerday was 9.0 and today was 9.2 -Given 1x of IV Fentanyl the day before yesterday but will start 12.5 mcg q6hprn  -Start him on gout treatment with prednisone 40 mill daily, colchicine 0.6 mg twice daily as well as a one-time dose of IV Ketorolac 15 mg the day before yesterday and yesterday -Increase Prednisone to 60 mg Daily -Obtain new Knee Sleeve   Acute kidney injury on chronic kidney disease stage IIIa -Creatinine at baseline and today his BUN/creatinine is now 20/1.17 -Given a one-time dose of IV ketorolac 15 mg x 1 so we will need to monitor renal function carefully -Avoid nephrotoxic medications, contrast dyes, hypotension and renally dose medications  -repeat CMP in a.m.  Transaminitis, alcohol pattern, history of hep C -significant elevated liver enzymes on admission, concern for shock liver versus ischemic hepatitis versus metabolic hepatitis.  GI consulted and evaluated Todd [name].  LFTs normalized as AST is now 32 and ALT is 33  Mild Rhabdomyolysis -Resolved with IV fluids  Diarrhea -C. difficile negative, possibly due to COVID-19 -Continue to monitor and trend as this is improving   Microcytic Anemia -Suspect from underlying alcohol use. Hemoglobin stable and is actually improved.  Now hemoglobin/hematocrit is 11.8/36.5 -Repeat CBC in the a.m. 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  He was on Coumadin in the past -Continue to Monitor   History of Dilated Aorta -Outpatient follow-up with cardiothoracic surgery  Depression -Longstanding -Resume Sertraline 25 mg po Daily   UTI with sepsis -Was on Ceftriaxone and stopped   Disposition-Was Stable to be discharged, however did not want SNF and wants to go back to [facility]. Unfortunately [facility] has an unreasonable request for Todd [name] to test negative prior to his return. He just tested positive again on 2/5. They are not going to hold his bed anymore, and they brought his belongings to the Hospital. He is now agreeable to going to SNF and he only has 1 bed offer available however he wants to discuss with other bed offers but this is not possible as no one else is willing to accept the Todd [name]. He only has 1 choice to go and they have not started insurance authorization. Discharge will be likely Monday  HTN -C/w Amlodipine 5 mg po Daily   DVT prophylaxis: Heparin 5,000 units sq q8h Code Status: FULL CODE  Family Communication: No family present  at bedside  Disposition Plan: SNF when bed available as currently has no bed offers any is homeless  Status is: Inpatient  Remains inpatient appropriate because:Unsafe d/c plan, IV treatments appropriate due to intensity of illness or inability to take PO and Inpatient level of care appropriate due to severity of illness   Dispo: The Todd [name] is from: Home              Anticipated d/c is to: SNF              Anticipated d/c date is: 1 day              Todd [name] currently is medically stable to d/c.   Difficult to place Todd [name] No not at the  moment  Consultants:   None   Procedures: Intubated on 09/02/2020 and then extubated on 09/04/2020  Antimicrobials:  Anti-infectives (From admission, onward)   Start     Dose/Rate Route Frequency Ordered Stop   09/17/20 1800  azithromycin (ZITHROMAX) tablet 500 mg        500 mg Oral Daily-1800 09/17/20 0852 09/19/20 1758   09/15/20 1800  azithromycin (ZITHROMAX) 500 mg in sodium chloride 0.9 % 250 mL IVPB  Status:  Discontinued        500 mg 250 mL/hr over 60 Minutes Intravenous Every 24 hours 09/15/20 1642 09/17/20 0852   09/15/20 1730  cefTRIAXone (ROCEPHIN) 2 g in sodium chloride 0.9 % 100 mL IVPB        2 g 200 mL/hr over 30 Minutes Intravenous Every 24 hours 09/15/20 1642 09/19/20 1748   09/15/20 1715  cefTRIAXone (ROCEPHIN) 1 g in sodium chloride 0.9 % 100 mL IVPB  Status:  Discontinued        1 g 200 mL/hr over 30 Minutes Intravenous Every 24 hours 09/15/20 1625 09/15/20 1642   09/08/20 1800  Ampicillin-Sulbactam (UNASYN) 3 g in sodium chloride 0.9 % 100 mL IVPB        3 g 200 mL/hr over 30 Minutes Intravenous Every 8 hours 09/08/20 1314 09/09/20 1748   09/04/20 2000  acyclovir (ZOVIRAX) 640 mg in dextrose 5 % 100 mL IVPB  Status:  Discontinued        10 mg/kg  63.8 kg (Ideal) 112.8 mL/hr over 60 Minutes Intravenous Every 12 hours 09/04/20 0826 09/04/20 0907   09/04/20 1000  Ampicillin-Sulbactam (UNASYN) 3 g in sodium chloride 0.9 % 100 mL IVPB  Status:  Discontinued        3 g 200 mL/hr over 30 Minutes Intravenous Every 8 hours 09/04/20 0907 09/08/20 1315   09/03/20 2200  piperacillin-tazobactam (ZOSYN) IVPB 3.375 g  Status:  Discontinued        3.375 g 12.5 mL/hr over 240 Minutes Intravenous Every 8 hours 09/03/20 1858 09/03/20 1928   09/03/20 2002  vancomycin variable dose per unstable renal function (pharmacist dosing)  Status:  Discontinued         Does not apply See admin instructions 09/03/20 2002 09/04/20 0907   09/03/20 2000  acyclovir (ZOVIRAX) 1,000 mg in  dextrose 5 % 150 mL IVPB  Status:  Discontinued        10 mg/kg  100 kg 170 mL/hr over 60 Minutes Intravenous Every 12 hours 09/03/20 1937 09/04/20 0826   09/03/20 1930  piperacillin-tazobactam (ZOSYN) IVPB 3.375 g  Status:  Discontinued        3.375 g 12.5 mL/hr over 240 Minutes Intravenous Every 8 hours 09/03/20 1928 09/04/20 0907   09/03/20  1815  ceFEPIme (MAXIPIME) 2 g in sodium chloride 0.9 % 100 mL IVPB  Status:  Discontinued        2 g 200 mL/hr over 30 Minutes Intravenous  Once 09/03/20 1801 09/03/20 1828   09/03/20 1815  metroNIDAZOLE (FLAGYL) IVPB 500 mg  Status:  Discontinued        500 mg 100 mL/hr over 60 Minutes Intravenous  Once 09/03/20 1801 09/03/20 1828   09/03/20 1815  vancomycin (VANCOCIN) IVPB 1000 mg/200 mL premix  Status:  Discontinued        1,000 mg 200 mL/hr over 60 Minutes Intravenous  Once 09/03/20 1801 09/03/20 1812   09/03/20 1815  vancomycin (VANCOREADY) IVPB 2000 mg/400 mL        2,000 mg 200 mL/hr over 120 Minutes Intravenous  Once 09/03/20 1812 09/03/20 2326        Subjective: Seen and examined at bedside and he continues to complain about his left-sided knee pain and upset still that he did not get a proper fitting knee sleeve. No nausea or vomiting. States that his knee still hurts but states is not as bad. No chest pain, lightheadedness or dizziness. No other concerns or complaints at this time. I discussed with them about him being medically stable and he wanted to discuss with the social worker about further SNF options  Objective: Vitals:   09/26/20 2114 09/27/20 0537 09/27/20 0539 09/27/20 1457  BP: 117/79 (!) 150/102 (!) 154/99 130/85  Pulse: 88 89 90 82  Resp: 18 19  17   Temp: 98.6 F (37 C) 98.6 F (37 C)  98.6 F (37 C)  TempSrc:      SpO2: 99% 98%  97%  Weight:      Height:        Intake/Output Summary (Last 24 hours) at 09/27/2020 1540 Last data filed at 09/26/2020 1800 Gross per 24 hour  Intake 1120 ml  Output 700 ml  Net  420 ml   Filed Weights   09/22/20 0500 09/23/20 0500 09/24/20 0500  Weight: 78.2 kg 81.3 kg 78.6 kg   Examination: Physical Exam:  Constitutional: WN/WD African-American male currently in mild distress appears very frustrated Eyes: Lids and conjunctivae normal, sclerae anicteric  ENMT: External Ears, Nose appear normal. Grossly normal hearing.  Neck: Appears normal, supple, no cervical masses, normal ROM, no appreciable thyromegaly: No JVD Respiratory: Diminished to auscultation bilaterally, no wheezing, rales, rhonchi or crackles. Normal respiratory effort and Todd [name] is not tachypenic. No accessory muscle use. Unlabored breathing Cardiovascular: RRR, no murmurs / rubs / gallops. S1 and S2 auscultated. Trace extremity edema Abdomen: Soft, non-tender, distended secondary body habitus. No masses palpated. No appreciable hepatosplenomegaly. Bowel sounds positive x4.   GU: Deferred. Musculoskeletal: No clubbing / cyanosis of digits/nails. No joint deformity upper and lower extremities and left knee is mildly swollen and not as tender to palpate.  Skin: No rashes, lesions, ulcers on limited skin evaluation. No induration; Warm and dry.  Neurologic: CN 2-12 grossly intact with no focal deficits.  Romberg sign and cerebellar reflexes not assessed.  Psychiatric: Normal judgment and insight. Alert and oriented x 3. Frustrated and agitated mood and appropriate affect.   Data Reviewed: I have personally reviewed following labs and imaging studies  CBC: Recent Labs  Lab 09/22/20 0150 09/24/20 0257 09/25/20 0233 09/26/20 0305 09/27/20 0359  WBC 4.8 5.2 5.0 11.5* 13.0*  NEUTROABS  --   --  2.2 7.7 8.5*  HGB 11.7* 12.2* 11.9* 11.9* 11.8*  HCT  37.2* 36.7* 38.2* 36.5* 36.5*  MCV 95.9 94.3 97.0 95.3 96.3  PLT 394 368 368 351 263   Basic Metabolic Panel: Recent Labs  Lab 09/22/20 0150 09/24/20 0257 09/25/20 0233 09/26/20 0305 09/27/20 0359  NA 140 139 141 139 140  K 4.2 4.3 4.5 5.0 4.4   CL 111 107 107 107 105  CO2 20* 22 24 23 24   GLUCOSE 107* 94 106* 166* 123*  BUN 8 11 13 17 20   CREATININE 1.34* 1.07 1.21 1.19 1.17  CALCIUM 9.3 9.3 9.3 9.6 9.4  MG  --   --  1.6* 1.9 1.6*  PHOS  --   --  3.5 2.1* 3.2   GFR: Estimated Creatinine Clearance: 62.9 mL/min (by C-G formula based on SCr of 1.17 mg/dL). Liver Function Tests: Recent Labs  Lab 09/22/20 0150 09/25/20 0233 09/26/20 0305 09/27/20 0359  AST 39 36 27 32  ALT 46* 36 30 33  ALKPHOS 47 55 51 47  BILITOT 0.6 0.5 0.4 0.4  PROT 7.2 7.0 6.9 6.8  ALBUMIN 2.3* 2.5* 2.5* 2.6*   No results for input(s): LIPASE, AMYLASE in the last 168 hours. No results for input(s): AMMONIA in the last 168 hours. Coagulation Profile: No results for input(s): INR, PROTIME in the last 168 hours. Cardiac Enzymes: No results for input(s): CKTOTAL, CKMB, CKMBINDEX, TROPONINI in the last 168 hours. BNP (last 3 results) No results for input(s): PROBNP in the last 8760 hours. HbA1C: No results for input(s): HGBA1C in the last 72 hours. CBG: Recent Labs  Lab 09/26/20 1141 09/26/20 1628 09/26/20 2116 09/27/20 0811 09/27/20 1145  GLUCAP 209* 254* 141* 129* 160*   Lipid Profile: No results for input(s): CHOL, HDL, LDLCALC, TRIG, CHOLHDL, LDLDIRECT in the last 72 hours. Thyroid Function Tests: No results for input(s): TSH, T4TOTAL, FREET4, T3FREE, THYROIDAB in the last 72 hours. Anemia Panel: No results for input(s): VITAMINB12, FOLATE, FERRITIN, TIBC, IRON, RETICCTPCT in the last 72 hours. Sepsis Labs: No results for input(s): PROCALCITON, LATICACIDVEN in the last 168 hours.  Recent Results (from the past 240 hour(s))  SARS CORONAVIRUS 2 (TAT 6-24 HRS) Nasopharyngeal Nasopharyngeal Swab     Status: Abnormal   Collection Time: 09/20/20 10:19 AM   Specimen: Nasopharyngeal Swab  Result Value Ref Range Status   SARS Coronavirus 2 POSITIVE (A) NEGATIVE Final    Comment: (NOTE) SARS-CoV-2 target nucleic acids are DETECTED.  The  SARS-CoV-2 RNA is generally detectable in upper and lower respiratory specimens during the acute phase of infection. Positive results are indicative of the presence of SARS-CoV-2 RNA. Clinical correlation with Todd [name] history and other diagnostic information is  necessary to determine Todd [name] infection status. Positive results do not rule out bacterial infection or co-infection with other viruses.  The expected result is Negative.  Fact Sheet for Patients: SugarRoll.be  Fact Sheet for Healthcare Providers: https://www.woods-mathews.com/  This test is not yet approved or cleared by the [place] FDA and  has been authorized for detection and/or diagnosis of SARS-CoV-2 by FDA under an Emergency Use Authorization (EUA). This EUA will remain  in effect (meaning this test can be used) for the duration of the COVID-19 declaration under Section 564(b)(1) of the Act, 21 U. S.C. section 360bbb-3(b)(1), unless the authorization is terminated or revoked sooner.   Performed at [facility], [place] [address] [place], [place] [address]      RN Pressure Injury Documentation:     Estimated body mass index is 27.97 kg/m as calculated from  the following:   Height as of this encounter: 5\' 6"  (1.676 m).   Weight as of this encounter: 78.6 kg.  Malnutrition Type:   Malnutrition Characteristics:   Nutrition Interventions:    Radiology Studies: No results found.  Scheduled Meds: . amLODipine  5 mg Oral Daily  . aspirin EC  81 mg Oral Daily  . Chlorhexidine Gluconate Cloth  6 each Topical Daily  . clotrimazole   Topical BID  . colchicine  0.6 mg Oral BID  . docusate sodium  100 mg Oral BID  . folic acid  1 mg Oral Daily  . heparin  5,000 Units Subcutaneous Q8H  . insulin aspart  0-9 Units Subcutaneous TID WC & HS  . metoprolol tartrate  25 mg Oral BID  . multivitamin with minerals  1 tablet Oral Daily  . pantoprazole  40 mg Oral QHS   . polyethylene glycol  17 g Oral Daily  . [START ON 09/28/2020] predniSONE  60 mg Oral Q breakfast  . sertraline  25 mg Oral Daily  . thiamine  100 mg Oral Daily   Continuous Infusions:   LOS: 24 days   Kerney Elbe, DO [facility] PAGER is on AMION  If 7PM-7AM, please contact night-coverage [email or web address]

## 2020-09-28 LAB — CBC WITH DIFFERENTIAL/PLATELET
Abs Immature Granulocytes: 0.19 10*3/uL — ABNORMAL HIGH (ref 0.00–0.07)
Basophils Absolute: 0.1 10*3/uL (ref 0.0–0.1)
Basophils Relative: 0 %
Eosinophils Absolute: 0.1 10*3/uL (ref 0.0–0.5)
Eosinophils Relative: 1 %
HCT: 38.5 % — ABNORMAL LOW (ref 39.0–52.0)
Hemoglobin: 12 g/dL — ABNORMAL LOW (ref 13.0–17.0)
Immature Granulocytes: 2 %
Lymphocytes Relative: 31 %
Lymphs Abs: 3.6 10*3/uL (ref 0.7–4.0)
MCH: 30.3 pg (ref 26.0–34.0)
MCHC: 31.2 g/dL (ref 30.0–36.0)
MCV: 97.2 fL (ref 80.0–100.0)
Monocytes Absolute: 1.1 10*3/uL — ABNORMAL HIGH (ref 0.1–1.0)
Monocytes Relative: 9 %
Neutro Abs: 6.7 10*3/uL (ref 1.7–7.7)
Neutrophils Relative %: 57 %
Platelets: 348 10*3/uL (ref 150–400)
RBC: 3.96 MIL/uL — ABNORMAL LOW (ref 4.22–5.81)
RDW: 14.6 % (ref 11.5–15.5)
WBC: 11.6 10*3/uL — ABNORMAL HIGH (ref 4.0–10.5)
nRBC: 0 % (ref 0.0–0.2)

## 2020-09-28 LAB — COMPREHENSIVE METABOLIC PANEL
ALT: 37 U/L (ref 0–44)
AST: 39 U/L (ref 15–41)
Albumin: 2.6 g/dL — ABNORMAL LOW (ref 3.5–5.0)
Alkaline Phosphatase: 49 U/L (ref 38–126)
Anion gap: 10 (ref 5–15)
BUN: 17 mg/dL (ref 8–23)
CO2: 26 mmol/L (ref 22–32)
Calcium: 9.4 mg/dL (ref 8.9–10.3)
Chloride: 105 mmol/L (ref 98–111)
Creatinine, Ser: 1.14 mg/dL (ref 0.61–1.24)
GFR, Estimated: >60 mL/min (ref >60)
Glucose, Bld: 107 mg/dL — ABNORMAL HIGH (ref 70–99)
Potassium: 4.3 mmol/L (ref 3.5–5.1)
Sodium: 141 mmol/L (ref 135–145)
Total Bilirubin: 0.6 mg/dL (ref 0.3–1.2)
Total Protein: 6.8 g/dL (ref 6.5–8.1)

## 2020-09-28 LAB — GLUCOSE, CAPILLARY
Glucose-Capillary: 120 mg/dL — ABNORMAL HIGH (ref 70–99)
Glucose-Capillary: 158 mg/dL — ABNORMAL HIGH (ref 70–99)
Glucose-Capillary: 154 mg/dL — ABNORMAL HIGH (ref 70–99)
Glucose-Capillary: 166 mg/dL — ABNORMAL HIGH (ref 70–99)

## 2020-09-28 LAB — PHOSPHORUS: Phosphorus: 4.2 mg/dL (ref 2.5–4.6)

## 2020-09-28 LAB — MAGNESIUM: Magnesium: 1.8 mg/dL (ref 1.7–2.4)

## 2020-09-28 LAB — URIC ACID: Uric Acid, Serum: 9 mg/dL — ABNORMAL HIGH (ref 3.7–8.6)

## 2020-09-28 MED ORDER — KETOROLAC TROMETHAMINE 15 MG/ML IJ SOLN
15.0000 mg | Freq: Four times a day (QID) | INTRAMUSCULAR | Status: DC | PRN
  Administered 2020-09-28: 15 mg via INTRAVENOUS
  Filled 2020-09-28: qty 1

## 2020-09-28 MED ORDER — MAGNESIUM SULFATE 2 GM/50ML IV SOLN
2.0000 g | Freq: Once | INTRAVENOUS | Status: AC
  Administered 2020-09-28: 2 g via INTRAVENOUS
  Filled 2020-09-28: qty 50

## 2020-09-28 NOTE — Progress Notes (Signed)
PROGRESS NOTE    Todd [name].  XIP:[id number] DOB: 01-16-1956 DOA: 09/03/2020 PCP: [name], MD   Brief Narrative:  Patient is a 65 year old African-American male with a past medical history significant for but not limited to diabetes mellitus type 2, hypertension, CKD stage IIIa, alcohol and tobacco abuse overall history of heroin abuse who presented from his homeless shelter when he was found down.  He was intubated in the ER under suspicion for seizure-like activity after intubation.  He was also found to be COVID-19 positive but was not treated with remdesivir or steroids at that time as symptoms were felt to be from aspiration.  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  EEG was done and showed diffuse encephalopathy.  LP was also done and was negative for meningitis.  He initially received IV vancomycin, IV Zosyn and IV acyclovir which was later transitioned to Unasyn for aspiration pneumonia.  He was seen by cardiology due to elevated troponin and they recommended outpatient ischemic work-up.  Also seen by GI due to severe transaminitis and advised to continue to monitor.  LFTs have improved and almost resolved.  PT OT recommending SNF.  He is deemed stable to be discharged wanted to go back to the [facility] however they requested a negative Covid test which was unreasonable and because his repeat Covid test on Saturday was still positive they discharged the patient from [facility] and he will need to go to SNF now that he longer has a bed available at [facility].  Transitions of care assisting with placement.  Patient continues to complain of left knee pain and apparently did not have a brace with this was ordered.  He likely has gout given his elevated uric acid level so we will start him on prednisone as well as colchicine.  His pain continues to be uncontrolled but is improving and so we will start  IV ketorolac as needed continue IV fentanyl as needed as well.  Orthopedic surgery was consulted given his continued knee pain for further evaluation and recommendations.  If Orthopedic Surgery clears the patient anticipating discharging to SNF.  Assessment & Plan:   Active Problems:   Acute respiratory failure with hypoxia (HCC)  Acute hypoxic respiratory failure due to COVID-19 infection, aspiration pneumonia -Initially admitted to the ICU and was intubated in the ED as he was found down.  Successfully extubated on 1/20, currently on room air.  Due to bilateral patchy infiltrates and recent fevers, elevated procalcitonin he completed 5 days of ceftriaxone and azithromycin. He initially tested positive for 1/19, given respiratory involvement will need to be isolated for 21 days up until 2/9.  Did not receive Remdesivir or steroids on admission.  -Remains on room air and respiratory status is stable but has been having a slight cough -Repeat CXR ordered and showed "Stable cardiomediastinal silhouette. No pneumothorax or pleural effusion is noted. Stable patchy airspace opacities are noted consistent multifocal pneumonia. Bony thorax is unremarkable." -SpO2: 96 % FiO2 (%): 40 % -Continue to Monitor Respiratory Status carefully and is currently on room air -We will need an ambulatory home O2 screen prior to discharge  Acute Metabolic encephalopathy, unresponsive episode, improved  -there were initial concerns for acute meningitis/encephalitis.  He was initially on broad-spectrum antibiotics with vancomycin, Zosyn, acyclovir, underwent an LP which was negative.   -Antibiotics were discontinued.  He underwent an EEG which showed diffuse encephalopathy without seizures, and UDS was  negative.  Mental status returned to baseline  Elevated Troponin -2D echo showed EF 10%, grade 1 diastolic dysfunction.   -Cardiology saw him and recommended for outpatient ischemic evaluation on discharge  Possible  Balanitis, candidal infection -Started clotrimazole 1% twice daily for 7 days on 2/5.  Improving  Osteoarthritis, chronic back pain, chronic left knee pain and now acute pain in the setting of likely gout -Patient got a brace for his back, left knee sleeve -Repeat Knee X-Ray obtained Mild degenerative joint disease. No acute abnormality seen in the left knee -Will obtain Uric Acid Level to evaluate for Gout in the AM; uric acid level was elevated at 9.1 and improved to 9.0 but then yesterday went to 9.2 and then today is 9.0 again -Given 1x of IV Fentanyl the day before yesterday but will start 12.5 mcg q6hprn  -Start him on gout treatment with prednisone 40 mill daily, colchicine 0.6 mg twice daily as well as a now starting IV ketorolac 50 mg as needed -Increase Prednisone to 60 mg Daily and will continue -Obtain new Knee Sleeve and has now fitting well -Orthopedic surgery was consulted for further evaluation given continued his knee pain  Acute kidney injury on chronic kidney disease stage IIIa -Creatinine at baseline and today his BUN/creatinine is now 20/1.17 and is now 17/1.14 -Avoid nephrotoxic medications, contrast dyes, hypotension and renally dose medications  -repeat CMP in a.m.  Transaminitis, alcohol pattern, history of hep C -significant elevated liver enzymes on admission, concern for shock liver versus ischemic hepatitis versus metabolic hepatitis.  GI consulted and evaluated patient.  LFTs normalized as AST is now 32 and ALT is 33  Mild Rhabdomyolysis -Resolved with IV fluids  Diarrhea -C. difficile negative, possibly due to COVID-19 -Continue to monitor and trend as this is improving   Microcytic Anemia -Suspect from underlying alcohol use. Hemoglobin stable and is actually improved.  Now hemoglobin/hematocrit is 12.0/38.5 -Repeat CBC in the a.m. and continue to monitor for signs or symptoms of bleeding; no overt bleeding noted  Hypophosphatemia -Phos Level  was 4.2 -Continue to Monitor and Trend -Repeat Phos Level in the AM  Hypomagnesemia -Patient magnesium level 1.8 -Repelte with IV Mag Sulfate 2 g again -continue monitor replete as necessary -Repeat Mag Level in the AM   Leukocytosis -2/2 To Steroid Demargination -WBC went from 5.0 -> 11.5 -> 13.0 and is now trended down to 11.6 -Continue to Monitor and Trend and evaluate for S/Sx of Infection -Repeat CBC in the AM   History of esophagitis, ulcerative -Continue PPI  History of DVT, PE -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  He was on Coumadin in the past -Continue to Monitor   History of Dilated Aorta -Outpatient follow-up with cardiothoracic surgery  Depression -Longstanding -Resume Sertraline 25 mg po Daily   UTI with sepsis, not present on admission -Had a recent fever with increased lethargy and UA suggestive of UTI.  He did complain of dysuria -Was on Ceftriaxone and received 5 days and stopped as sepsis physiology resolved  Disposition-Was Stable to be discharged, however did not want SNF and wants to go back to [facility]. Unfortunately [facility] has an unreasonable request for patient to test negative prior to his return. He just tested positive again on 2/5. They are not going to hold his bed anymore, and they brought his belongings to the Hospital. He is now agreeable to going to SNF and he only has 1 bed offer available however he wants to  discuss with other bed offers but this is not possible as no one else is willing to accept the patient. He only has 1 choice to go and they have not started insurance authorization. Discharge will be likely Monday  HTN -C/w Amlodipine 5 mg po Daily   DVT prophylaxis: Heparin 5,000 units sq q8h Code Status: FULL CODE  Family Communication: No family present at bedside  Disposition Plan: SNF when bed available as currently has no bed offers any is homeless  Status is: Inpatient  Remains  inpatient appropriate because:Unsafe d/c plan, IV treatments appropriate due to intensity of illness or inability to take PO and Inpatient level of care appropriate due to severity of illness   Dispo: The patient is from: Home              Anticipated d/c is to: SNF              Anticipated d/c date is: 1 day              Patient currently is medically stable to d/c.   Difficult to place patient No not at the moment  Consultants:   None   Procedures: Intubated on 09/02/2020 and then extubated on 09/04/2020  Antimicrobials:  Anti-infectives (From admission, onward)   Start     Dose/Rate Route Frequency Ordered Stop   09/17/20 1800  azithromycin (ZITHROMAX) tablet 500 mg        500 mg Oral Daily-1800 09/17/20 0852 09/19/20 1758   09/15/20 1800  azithromycin (ZITHROMAX) 500 mg in sodium chloride 0.9 % 250 mL IVPB  Status:  Discontinued        500 mg 250 mL/hr over 60 Minutes Intravenous Every 24 hours 09/15/20 1642 09/17/20 0852   09/15/20 1730  cefTRIAXone (ROCEPHIN) 2 g in sodium chloride 0.9 % 100 mL IVPB        2 g 200 mL/hr over 30 Minutes Intravenous Every 24 hours 09/15/20 1642 09/19/20 1748   09/15/20 1715  cefTRIAXone (ROCEPHIN) 1 g in sodium chloride 0.9 % 100 mL IVPB  Status:  Discontinued        1 g 200 mL/hr over 30 Minutes Intravenous Every 24 hours 09/15/20 1625 09/15/20 1642   09/08/20 1800  Ampicillin-Sulbactam (UNASYN) 3 g in sodium chloride 0.9 % 100 mL IVPB        3 g 200 mL/hr over 30 Minutes Intravenous Every 8 hours 09/08/20 1314 09/09/20 1748   09/04/20 2000  acyclovir (ZOVIRAX) 640 mg in dextrose 5 % 100 mL IVPB  Status:  Discontinued        10 mg/kg  63.8 kg (Ideal) 112.8 mL/hr over 60 Minutes Intravenous Every 12 hours 09/04/20 0826 09/04/20 0907   09/04/20 1000  Ampicillin-Sulbactam (UNASYN) 3 g in sodium chloride 0.9 % 100 mL IVPB  Status:  Discontinued        3 g 200 mL/hr over 30 Minutes Intravenous Every 8 hours 09/04/20 0907 09/08/20 1315   09/03/20  2200  piperacillin-tazobactam (ZOSYN) IVPB 3.375 g  Status:  Discontinued        3.375 g 12.5 mL/hr over 240 Minutes Intravenous Every 8 hours 09/03/20 1858 09/03/20 1928   09/03/20 2002  vancomycin variable dose per unstable renal function (pharmacist dosing)  Status:  Discontinued         Does not apply See admin instructions 09/03/20 2002 09/04/20 0907   09/03/20 2000  acyclovir (ZOVIRAX) 1,000 mg in dextrose 5 % 150 mL IVPB  Status:  Discontinued        10 mg/kg  100 kg 170 mL/hr over 60 Minutes Intravenous Every 12 hours 09/03/20 1937 09/04/20 0826   09/03/20 1930  piperacillin-tazobactam (ZOSYN) IVPB 3.375 g  Status:  Discontinued        3.375 g 12.5 mL/hr over 240 Minutes Intravenous Every 8 hours 09/03/20 1928 09/04/20 0907   09/03/20 1815  ceFEPIme (MAXIPIME) 2 g in sodium chloride 0.9 % 100 mL IVPB  Status:  Discontinued        2 g 200 mL/hr over 30 Minutes Intravenous  Once 09/03/20 1801 09/03/20 1828   09/03/20 1815  metroNIDAZOLE (FLAGYL) IVPB 500 mg  Status:  Discontinued        500 mg 100 mL/hr over 60 Minutes Intravenous  Once 09/03/20 1801 09/03/20 1828   09/03/20 1815  vancomycin (VANCOCIN) IVPB 1000 mg/200 mL premix  Status:  Discontinued        1,000 mg 200 mL/hr over 60 Minutes Intravenous  Once 09/03/20 1801 09/03/20 1812   09/03/20 1815  vancomycin (VANCOREADY) IVPB 2000 mg/400 mL        2,000 mg 200 mL/hr over 120 Minutes Intravenous  Once 09/03/20 1812 09/03/20 2326       Subjective: Seen and examined at bedside and he is doing okay but states his knee is agitating him today.  No nausea or vomiting.  Feels okay and denies any chest pain or shortness of breath.  No other concerns or complaints at this time.  Objective: Vitals:   09/27/20 1457 09/27/20 2100 09/28/20 0500 09/28/20 1422  BP: 130/85 133/80 (!) 137/95 (!) 142/91  Pulse: 82 73 82 82  Resp: 17 18  17   Temp: 98.6 F (37 C) 98.1 F (36.7 C) 98.4 F (36.9 C) 98.5 F (36.9 C)  TempSrc:  Oral Oral    SpO2: 97% 96% 98% 96%  Weight:      Height:        Intake/Output Summary (Last 24 hours) at 09/28/2020 1718 Last data filed at 09/28/2020 1500 Gross per 24 hour  Intake 780 ml  Output 1700 ml  Net -920 ml   Filed Weights   09/22/20 0500 09/23/20 0500 09/24/20 0500  Weight: 78.2 kg 81.3 kg 78.6 kg   Examination: Physical Exam:  Constitutional: WN/WD African-American male currently in no acute distress appears agitated again complaining of some left knee pain. Eyes: Lids and conjunctivae normal, sclerae anicteric  ENMT: External Ears, Nose appear normal. Grossly normal hearing.  Neck: Appears normal, supple, no cervical masses, normal ROM, no appreciable thyromegaly; no JVD Respiratory: Diminished to auscultation bilaterally, no wheezing, rales, rhonchi or crackles. Normal respiratory effort and patient is not tachypenic. No accessory muscle use.  Unlabored breathing Cardiovascular: RRR, no murmurs / rubs / gallops. S1 and S2 auscultated.  Minimal extremity edema Abdomen: Soft, non-tender, non-distended. Bowel sounds positive.  GU: Deferred. Musculoskeletal: No clubbing / cyanosis of digits/nails. No joint deformity upper and lower extremities has mild tenderness on palpation of his left knee.   Skin: No rashes, lesions, ulcers on limited skin evaluation. No induration; Warm and dry.  Neurologic: CN 2-12 grossly intact with no focal deficits. Romberg sign and cerebellar reflexes not assessed.  Psychiatric: Normal judgment and insight. Alert and oriented x 3.  Slightly frustrated mood and appropriate affect.   Data Reviewed: I have personally reviewed following labs and imaging studies  CBC: Recent Labs  Lab 09/24/20 0257 09/25/20 0233 09/26/20 0305 09/27/20 0359 09/28/20 3235  WBC 5.2 5.0 11.5* 13.0* 11.6*  NEUTROABS  --  2.2 7.7 8.5* 6.7  HGB 12.2* 11.9* 11.9* 11.8* 12.0*  HCT 36.7* 38.2* 36.5* 36.5* 38.5*  MCV 94.3 97.0 95.3 96.3 97.2  PLT 368 368 351 348 606   Basic  Metabolic Panel: Recent Labs  Lab 09/24/20 0257 09/25/20 0233 09/26/20 0305 09/27/20 0359 09/28/20 0338  NA 139 141 139 140 141  K 4.3 4.5 5.0 4.4 4.3  CL 107 107 107 105 105  CO2 22 24 23 24 26   GLUCOSE 94 106* 166* 123* 107*  BUN 11 13 17 20 17   CREATININE 1.07 1.21 1.19 1.17 1.14  CALCIUM 9.3 9.3 9.6 9.4 9.4  MG  --  1.6* 1.9 1.6* 1.8  PHOS  --  3.5 2.1* 3.2 4.2   GFR: Estimated Creatinine Clearance: 64.5 mL/min (by C-G formula based on SCr of 1.14 mg/dL). Liver Function Tests: Recent Labs  Lab 09/22/20 0150 09/25/20 0233 09/26/20 0305 09/27/20 0359 09/28/20 0338  AST 39 36 27 32 39  ALT 46* 36 30 33 37  ALKPHOS 47 55 51 47 49  BILITOT 0.6 0.5 0.4 0.4 0.6  PROT 7.2 7.0 6.9 6.8 6.8  ALBUMIN 2.3* 2.5* 2.5* 2.6* 2.6*   No results for input(s): LIPASE, AMYLASE in the last 168 hours. No results for input(s): AMMONIA in the last 168 hours. Coagulation Profile: No results for input(s): INR, PROTIME in the last 168 hours. Cardiac Enzymes: No results for input(s): CKTOTAL, CKMB, CKMBINDEX, TROPONINI in the last 168 hours. BNP (last 3 results) No results for input(s): PROBNP in the last 8760 hours. HbA1C: No results for input(s): HGBA1C in the last 72 hours. CBG: Recent Labs  Lab 09/27/20 1608 09/27/20 2137 09/28/20 0746 09/28/20 1156 09/28/20 1549  GLUCAP 196* 161* 120* 158* 154*   Lipid Profile: No results for input(s): CHOL, HDL, LDLCALC, TRIG, CHOLHDL, LDLDIRECT in the last 72 hours. Thyroid Function Tests: No results for input(s): TSH, T4TOTAL, FREET4, T3FREE, THYROIDAB in the last 72 hours. Anemia Panel: No results for input(s): VITAMINB12, FOLATE, FERRITIN, TIBC, IRON, RETICCTPCT in the last 72 hours. Sepsis Labs: No results for input(s): PROCALCITON, LATICACIDVEN in the last 168 hours.  Recent Results (from the past 240 hour(s))  SARS CORONAVIRUS 2 (TAT 6-24 HRS) Nasopharyngeal Nasopharyngeal Swab     Status: Abnormal   Collection Time: 09/20/20  10:19 AM   Specimen: Nasopharyngeal Swab  Result Value Ref Range Status   SARS Coronavirus 2 POSITIVE (A) NEGATIVE Final    Comment: (NOTE) SARS-CoV-2 target nucleic acids are DETECTED.  The SARS-CoV-2 RNA is generally detectable in upper and lower respiratory specimens during the acute phase of infection. Positive results are indicative of the presence of SARS-CoV-2 RNA. Clinical correlation with patient history and other diagnostic information is  necessary to determine patient infection status. Positive results do not rule out bacterial infection or co-infection with other viruses.  The expected result is Negative.  Fact Sheet for Patients: SugarRoll.be  Fact Sheet for Healthcare Providers: https://www.woods-mathews.com/  This test is not yet approved or cleared by the [place] FDA and  has been authorized for detection and/or diagnosis of SARS-CoV-2 by FDA under an Emergency Use Authorization (EUA). This EUA will remain  in effect (meaning this test can be used) for the duration of the COVID-19 declaration under Section 564(b)(1) of the Act, 21 U. S.C. section 360bbb-3(b)(1), unless the authorization is terminated or revoked sooner.   Performed at [facility], [place]  [address], [place], [place] [address]      RN Pressure Injury Documentation:     Estimated body mass index is 27.97 kg/m as calculated from the following:   Height as of this encounter: 5\' 6"  (1.676 m).   Weight as of this encounter: 78.6 kg.  Malnutrition Type:   Malnutrition Characteristics:   Nutrition Interventions:    Radiology Studies: No results found.  Scheduled Meds: . amLODipine  5 mg Oral Daily  . aspirin EC  81 mg Oral Daily  . Chlorhexidine Gluconate Cloth  6 each Topical Daily  . clotrimazole   Topical BID  . colchicine  0.6 mg Oral BID  . docusate sodium  100 mg Oral BID  . folic acid  1 mg Oral Daily  . heparin  5,000 Units  Subcutaneous Q8H  . insulin aspart  0-9 Units Subcutaneous TID WC & HS  . metoprolol tartrate  25 mg Oral BID  . multivitamin with minerals  1 tablet Oral Daily  . pantoprazole  40 mg Oral QHS  . polyethylene glycol  17 g Oral Daily  . predniSONE  60 mg Oral Q breakfast  . sertraline  25 mg Oral Daily  . thiamine  100 mg Oral Daily   Continuous Infusions:   LOS: 25 days   Kerney Elbe, DO [facility] PAGER is on [place]  If 7PM-7AM, please contact night-coverage [email or web address]

## 2020-09-29 DIAGNOSIS — M109 Gout, unspecified: Secondary | ICD-10-CM | POA: Diagnosis not present

## 2020-09-29 DIAGNOSIS — R52 Pain, unspecified: Secondary | ICD-10-CM | POA: Diagnosis not present

## 2020-09-29 DIAGNOSIS — R69 Illness, unspecified: Secondary | ICD-10-CM | POA: Diagnosis not present

## 2020-09-29 DIAGNOSIS — M2550 Pain in unspecified joint: Secondary | ICD-10-CM | POA: Diagnosis not present

## 2020-09-29 DIAGNOSIS — I10 Essential (primary) hypertension: Secondary | ICD-10-CM | POA: Diagnosis not present

## 2020-09-29 DIAGNOSIS — Z743 Need for continuous supervision: Secondary | ICD-10-CM | POA: Diagnosis not present

## 2020-09-29 DIAGNOSIS — U071 COVID-19: Secondary | ICD-10-CM | POA: Diagnosis not present

## 2020-09-29 DIAGNOSIS — Z8616 Personal history of COVID-19: Secondary | ICD-10-CM | POA: Diagnosis not present

## 2020-09-29 DIAGNOSIS — N179 Acute kidney failure, unspecified: Secondary | ICD-10-CM | POA: Diagnosis not present

## 2020-09-29 DIAGNOSIS — T17908D Unspecified foreign body in respiratory tract, part unspecified causing other injury, subsequent encounter: Secondary | ICD-10-CM | POA: Diagnosis not present

## 2020-09-29 DIAGNOSIS — R0602 Shortness of breath: Secondary | ICD-10-CM | POA: Diagnosis not present

## 2020-09-29 DIAGNOSIS — J9602 Acute respiratory failure with hypercapnia: Secondary | ICD-10-CM | POA: Diagnosis not present

## 2020-09-29 DIAGNOSIS — M25562 Pain in left knee: Secondary | ICD-10-CM | POA: Diagnosis not present

## 2020-09-29 DIAGNOSIS — R0600 Dyspnea, unspecified: Secondary | ICD-10-CM | POA: Diagnosis not present

## 2020-09-29 DIAGNOSIS — R7401 Elevation of levels of liver transaminase levels: Secondary | ICD-10-CM | POA: Diagnosis not present

## 2020-09-29 DIAGNOSIS — J9600 Acute respiratory failure, unspecified whether with hypoxia or hypercapnia: Secondary | ICD-10-CM | POA: Diagnosis not present

## 2020-09-29 DIAGNOSIS — J9601 Acute respiratory failure with hypoxia: Secondary | ICD-10-CM | POA: Diagnosis not present

## 2020-09-29 DIAGNOSIS — Z7401 Bed confinement status: Secondary | ICD-10-CM | POA: Diagnosis not present

## 2020-09-29 DIAGNOSIS — R569 Unspecified convulsions: Secondary | ICD-10-CM | POA: Diagnosis not present

## 2020-09-29 LAB — CBC WITH DIFFERENTIAL/PLATELET
Abs Immature Granulocytes: 0.17 10*3/uL — ABNORMAL HIGH (ref 0.00–0.07)
Basophils Absolute: 0 10*3/uL (ref 0.0–0.1)
Basophils Relative: 0 %
Eosinophils Absolute: 0.1 10*3/uL (ref 0.0–0.5)
Eosinophils Relative: 1 %
HCT: 37.5 % — ABNORMAL LOW (ref 39.0–52.0)
Hemoglobin: 11.9 g/dL — ABNORMAL LOW (ref 13.0–17.0)
Immature Granulocytes: 1 %
Lymphocytes Relative: 28 %
Lymphs Abs: 3.8 10*3/uL (ref 0.7–4.0)
MCH: 30.3 pg (ref 26.0–34.0)
MCHC: 31.7 g/dL (ref 30.0–36.0)
MCV: 95.4 fL (ref 80.0–100.0)
Monocytes Absolute: 1.2 10*3/uL — ABNORMAL HIGH (ref 0.1–1.0)
Monocytes Relative: 9 %
Neutro Abs: 8.5 10*3/uL — ABNORMAL HIGH (ref 1.7–7.7)
Neutrophils Relative %: 61 %
Platelets: 346 10*3/uL (ref 150–400)
RBC: 3.93 MIL/uL — ABNORMAL LOW (ref 4.22–5.81)
RDW: 14.5 % (ref 11.5–15.5)
WBC: 13.8 10*3/uL — ABNORMAL HIGH (ref 4.0–10.5)
nRBC: 0 % (ref 0.0–0.2)

## 2020-09-29 LAB — URIC ACID: Uric Acid, Serum: 9.2 mg/dL — ABNORMAL HIGH (ref 3.7–8.6)

## 2020-09-29 LAB — GLUCOSE, CAPILLARY
Glucose-Capillary: 85 mg/dL (ref 70–99)
Glucose-Capillary: 182 mg/dL — ABNORMAL HIGH (ref 70–99)
Glucose-Capillary: 217 mg/dL — ABNORMAL HIGH (ref 70–99)

## 2020-09-29 LAB — COMPREHENSIVE METABOLIC PANEL
ALT: 34 U/L (ref 0–44)
AST: 29 U/L (ref 15–41)
Albumin: 2.6 g/dL — ABNORMAL LOW (ref 3.5–5.0)
Alkaline Phosphatase: 48 U/L (ref 38–126)
Anion gap: 11 (ref 5–15)
BUN: 25 mg/dL — ABNORMAL HIGH (ref 8–23)
CO2: 24 mmol/L (ref 22–32)
Calcium: 9.3 mg/dL (ref 8.9–10.3)
Chloride: 105 mmol/L (ref 98–111)
Creatinine, Ser: 1.15 mg/dL (ref 0.61–1.24)
GFR, Estimated: >60 mL/min (ref >60)
Glucose, Bld: 117 mg/dL — ABNORMAL HIGH (ref 70–99)
Potassium: 4.7 mmol/L (ref 3.5–5.1)
Sodium: 140 mmol/L (ref 135–145)
Total Bilirubin: 0.6 mg/dL (ref 0.3–1.2)
Total Protein: 6.7 g/dL (ref 6.5–8.1)

## 2020-09-29 LAB — PHOSPHORUS: Phosphorus: 4.6 mg/dL (ref 2.5–4.6)

## 2020-09-29 LAB — MAGNESIUM: Magnesium: 2.1 mg/dL (ref 1.7–2.4)

## 2020-09-29 MED ORDER — KETOROLAC TROMETHAMINE 30 MG/ML IJ SOLN
30.0000 mg | Freq: Once | INTRAMUSCULAR | Status: AC
  Administered 2020-09-29: 30 mg via INTRAVENOUS
  Filled 2020-09-29: qty 1

## 2020-09-29 MED ORDER — SUCRALFATE 1 GM/10ML PO SUSP
1.0000 g | Freq: Three times a day (TID) | ORAL | 0 refills | Status: DC
Start: 2020-09-29 — End: 2021-09-04

## 2020-09-29 MED ORDER — FOLIC ACID 1 MG PO TABS: 1.0000 mg | ORAL_TABLET | Freq: Every day | ORAL | 0 refills | Status: DC

## 2020-09-29 MED ORDER — SERTRALINE HCL 25 MG PO TABS: 25.0000 mg | ORAL_TABLET | Freq: Every day | ORAL | 0 refills | Status: DC

## 2020-09-29 MED ORDER — BUPIVACAINE HCL (PF) 0.5 % IJ SOLN
10.0000 mL | Freq: Once | INTRAMUSCULAR | Status: DC
  Filled 2020-09-29 (×2): qty 10

## 2020-09-29 MED ORDER — PREDNISONE 10 MG (21) PO TBPK: ORAL_TABLET | ORAL | 0 refills | Status: DC

## 2020-09-29 MED ORDER — METHYLPREDNISOLONE ACETATE 80 MG/ML IJ SUSP
80.0000 mg | Freq: Once | INTRAMUSCULAR | Status: DC
  Filled 2020-09-29: qty 1

## 2020-09-29 MED ORDER — CLOTRIMAZOLE 1 % EX CREA: TOPICAL_CREAM | Freq: Two times a day (BID) | CUTANEOUS | 0 refills | Status: DC

## 2020-09-29 MED ORDER — ACETAMINOPHEN 325 MG PO TABS: 650.0000 mg | ORAL_TABLET | Freq: Three times a day (TID) | ORAL | 0 refills | Status: DC | PRN

## 2020-09-29 MED ORDER — TRAMADOL HCL 50 MG PO TABS: 50.0000 mg | ORAL_TABLET | Freq: Four times a day (QID) | ORAL | 0 refills | Status: DC | PRN

## 2020-09-29 MED ORDER — METOPROLOL TARTRATE 25 MG PO TABS: 25.0000 mg | ORAL_TABLET | Freq: Two times a day (BID) | ORAL | 0 refills | Status: DC

## 2020-09-29 MED ORDER — ASPIRIN EC 81 MG PO TBEC: 81.0000 mg | DELAYED_RELEASE_TABLET | Freq: Every day | ORAL | 0 refills | Status: DC

## 2020-09-29 MED ORDER — ADULT MULTIVITAMIN W/MINERALS CH: 1.0000 | ORAL_TABLET | Freq: Every day | ORAL | 0 refills | Status: DC

## 2020-09-29 MED ORDER — COLCHICINE 0.6 MG PO TABS: 0.6000 mg | ORAL_TABLET | Freq: Every day | ORAL | 0 refills | Status: DC

## 2020-09-29 MED ORDER — PANTOPRAZOLE SODIUM 40 MG PO TBEC: 40.0000 mg | DELAYED_RELEASE_TABLET | Freq: Every day | ORAL | 0 refills | Status: DC

## 2020-09-29 MED ORDER — POLYETHYLENE GLYCOL 3350 17 G PO PACK: 17.0000 g | PACK | Freq: Every day | ORAL | 0 refills | Status: DC

## 2020-09-29 MED ORDER — AMLODIPINE BESYLATE 5 MG PO TABS: 5.0000 mg | ORAL_TABLET | Freq: Every day | ORAL | 0 refills | Status: DC

## 2020-09-29 MED ORDER — DOCUSATE SODIUM 100 MG PO CAPS: 100.0000 mg | ORAL_CAPSULE | Freq: Two times a day (BID) | ORAL | 0 refills | Status: DC

## 2020-09-29 MED ORDER — THIAMINE HCL 100 MG PO TABS: 100.0000 mg | ORAL_TABLET | Freq: Every day | ORAL | 0 refills | Status: DC

## 2020-09-29 MED ORDER — ALBUTEROL SULFATE (2.5 MG/3ML) 0.083% IN NEBU: 2.5000 mg | INHALATION_SOLUTION | RESPIRATORY_TRACT | 12 refills | Status: DC | PRN

## 2020-09-29 MED ORDER — DM-GUAIFENESIN ER 30-600 MG PO TB12: 1.0000 | ORAL_TABLET | Freq: Two times a day (BID) | ORAL | 0 refills | Status: DC | PRN

## 2020-09-29 NOTE — Progress Notes (Signed)
Patient discharging to [facility]. Attempted to call report x 1. Nursing station did not answer. Left call back number with receptionist.

## 2020-09-29 NOTE — Consult Note (Signed)
Reason for Consult:Left knee pain Referring Physician: Claybon Jabs Time called: 0730 Time at bedside: Todd [name]. is an 65 y.o. male.  HPI: Todd [name] was admitted nearly a month ago after he was found down at the homeless shelter where he was residing. During his stay he has c/o left knee pain. He states this has been going on for months if not longer than a year. He denies any short-term change in the way it feels. He denies known hx/o gout. He denies fevers, chills, sweats, N/V. He states the knee never really gave him trouble until it started sometime last year. He has been able to bear weight and ambulate here but with significant difficulty.  Past Medical History:  Diagnosis Date  . Arthritis    "fingers" (07/14/2018)  . Depression   . DVT (deep venous thrombosis) (HCC) LLE  . Hepatitis C     finished [facility] tx ~ 2017  . Hypercholesterolemia   . Hypertension   . Prostate cancer ([place]) 5yr ago  . 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 Sleep apnea    not currently using cpap, mask causing vertigo  . Type II diabetes mellitus ([facility]     Past Surgical History:  Procedure Laterality Date  . BIOPSY  01/12/2019   Procedure: BIOPSY;  Surgeon: BRonald Lobo MD;  [facility];  Service: Endoscopy;;  . BIOPSY  06/23/2019   Procedure: BIOPSY;  Surgeon: OArta Silence MD;  [facility]: [facility]  Service: Endoscopy;;  . COLONOSCOPY WITH PROPOFOL N/A 02/19/2014   Procedure: COLONOSCOPY WITH PROPOFOL;  Surgeon: MGarlan Fair MD;  [facility];  Service: Endoscopy;  Laterality: N/A;  . ENDOVENOUS ABLATION SAPHENOUS VEIN W/ LASER Left 11/22/2017   endovenous laser ablation L SSV by JTinnie GensMD   . ESOPHAGEAL BRUSHING  06/23/2019   Procedure: ESOPHAGEAL BRUSHING;  Surgeon: OArta Silence MD;  [facility]  Service: Endoscopy;;  . ESOPHAGOGASTRODUODENOSCOPY (EGD) WITH PROPOFOL N/A 01/12/2019   Procedure: ESOPHAGOGASTRODUODENOSCOPY  (EGD) WITH PROPOFOL;  Surgeon: BRonald Lobo MD;  [place] [facility]  Service: Endoscopy;  Laterality: N/A;  Patient is also scheduled for barium swallow; please notify radiology after patient's EGD is complete so that barium swallow follows the endoscopy, not vice versa  . ESOPHAGOGASTRODUODENOSCOPY (EGD) WITH PROPOFOL N/A 06/23/2019   Procedure: ESOPHAGOGASTRODUODENOSCOPY (EGD) WITH PROPOFOL;  Surgeon: OArta Silence MD;  [facility] [facility]  Service: Endoscopy;  Laterality: N/A;  . PROSTATECTOMY  2008  . REPAIR QUADRICEPS / HAMSTRING MUSCLE Right     Family History  Problem Relation Age of Onset  . Cancer Father        PROSTATE    Social History:  reports that he has been smoking cigarettes. He has a 5.40 pack-year smoking history. He has never used smokeless tobacco. He reports current alcohol use. He reports current drug use.  Allergies: No Known Allergies  Medications: I have reviewed the patient's current medications.  Results for orders placed or performed during the hospital encounter of 09/03/20 (from the past 48 hour(s))  Glucose, capillary     Status: Abnormal   Collection Time: 09/27/20 11:45 AM  Result Value Ref Range   Glucose-Capillary 160 (H) 70 - 99 mg/dL    Comment: Glucose reference range applies only to samples taken after fasting for at least 8 hours.  Glucose, capillary     Status: Abnormal   Collection Time: 09/27/20  4:08 PM  Result Value Ref Range   Glucose-Capillary 196 (  H) 70 - 99 mg/dL    Comment: Glucose reference range applies only to samples taken after fasting for at least 8 hours.  Glucose, capillary     Status: Abnormal   Collection Time: 09/27/20  9:37 PM  Result Value Ref Range   Glucose-Capillary 161 (H) 70 - 99 mg/dL    Comment: Glucose reference range applies only to samples taken after fasting for at least 8 hours.  CBC with Differential/Platelet     Status: Abnormal   Collection Time: 09/28/20  3:38 AM  Result Value Ref Range    WBC 11.6 (H) 4.0 - 10.5 K/uL   RBC 3.96 (L) 4.22 - 5.81 MIL/uL   Hemoglobin 12.0 (L) 13.0 - 17.0 g/dL   HCT 38.5 (L) 39.0 - 52.0 %   MCV 97.2 80.0 - 100.0 fL   MCH 30.3 26.0 - 34.0 pg   MCHC 31.2 30.0 - 36.0 g/dL   RDW 14.6 11.5 - 15.5 %   Platelets 348 150 - 400 K/uL   nRBC 0.0 0.0 - 0.2 %   Neutrophils Relative % 57 %   Neutro Abs 6.7 1.7 - 7.7 K/uL   Lymphocytes Relative 31 %   Lymphs Abs 3.6 0.7 - 4.0 K/uL   Monocytes Relative 9 %   Monocytes Absolute 1.1 (H) 0.1 - 1.0 K/uL   Eosinophils Relative 1 %   Eosinophils Absolute 0.1 0.0 - 0.5 K/uL   Basophils Relative 0 %   Basophils Absolute 0.1 0.0 - 0.1 K/uL   Immature Granulocytes 2 %   Abs Immature Granulocytes 0.19 (H) 0.00 - 0.07 K/uL    Comment: Performed at [facility], [address], [place], [place] [address]  Comprehensive metabolic panel     Status: Abnormal   Collection Time: 09/28/20  3:38 AM  Result Value Ref Range   Sodium 141 135 - 145 mmol/L   Potassium 4.3 3.5 - 5.1 mmol/L   Chloride 105 98 - 111 mmol/L   CO2 26 22 - 32 mmol/L   Glucose, Bld 107 (H) 70 - 99 mg/dL    Comment: Glucose reference range applies only to samples taken after fasting for at least 8 hours.   BUN 17 8 - 23 mg/dL   Creatinine, Ser 1.14 0.61 - 1.24 mg/dL   Calcium 9.4 8.9 - 10.3 mg/dL   Total Protein 6.8 6.5 - 8.1 g/dL   Albumin 2.6 (L) 3.5 - 5.0 g/dL   AST 39 15 - 41 U/L   ALT 37 0 - 44 U/L   Alkaline Phosphatase 49 38 - 126 U/L   Total Bilirubin 0.6 0.3 - 1.2 mg/dL   GFR, Estimated >60 >60 mL/min    Comment: (NOTE) Calculated using the CKD-EPI Creatinine Equation (2021)    Anion gap 10 5 - 15    Comment: Performed at [facility] [address] [place], [place] [address]  Phosphorus     Status: None   Collection Time: 09/28/20  3:38 AM  Result Value Ref Range   Phosphorus 4.2 2.5 - 4.6 mg/dL    Comment: Performed at [facility] [address] [place], [place] [address]  Magnesium     Status: None    Collection Time: 09/28/20  3:38 AM  Result Value Ref Range   Magnesium 1.8 1.7 - 2.4 mg/dL    Comment: Performed at [facility] [address], [place], [place] [address]  Uric acid     Status:  Abnormal   Collection Time: 09/28/20  3:38 AM  Result Value Ref Range   Uric Acid, Serum 9.0 (H) 3.7 - 8.6 mg/dL    Comment: Performed at [facility] [address] [place], [place] [address]  Glucose, capillary     Status: Abnormal   Collection Time: 09/28/20  7:46 AM  Result Value Ref Range   Glucose-Capillary 120 (H) 70 - 99 mg/dL    Comment: Glucose reference range applies only to samples taken after fasting for at least 8 hours.  Glucose, capillary     Status: Abnormal   Collection Time: 09/28/20 11:56 AM  Result Value Ref Range   Glucose-Capillary 158 (H) 70 - 99 mg/dL    Comment: Glucose reference range applies only to samples taken after fasting for at least 8 hours.  Glucose, capillary     Status: Abnormal   Collection Time: 09/28/20  3:49 PM  Result Value Ref Range   Glucose-Capillary 154 (H) 70 - 99 mg/dL    Comment: Glucose reference range applies only to samples taken after fasting for at least 8 hours.  Glucose, capillary     Status: Abnormal   Collection Time: 09/28/20  7:44 PM  Result Value Ref Range   Glucose-Capillary 166 (H) 70 - 99 mg/dL    Comment: Glucose reference range applies only to samples taken after fasting for at least 8 hours.  CBC with Differential/Platelet     Status: Abnormal   Collection Time: 09/29/20  1:06 AM  Result Value Ref Range   WBC 13.8 (H) 4.0 - 10.5 K/uL   RBC 3.93 (L) 4.22 - 5.81 MIL/uL   Hemoglobin 11.9 (L) 13.0 - 17.0 g/dL   HCT 37.5 (L) 39.0 - 52.0 %   MCV 95.4 80.0 - 100.0 fL   MCH 30.3 26.0 - 34.0 pg   MCHC 31.7 30.0 - 36.0 g/dL   RDW 14.5 11.5 - 15.5 %   Platelets 346 150 - 400 K/uL   nRBC 0.0 0.0 - 0.2 %   Neutrophils Relative % 61 %   Neutro Abs 8.5 (H) 1.7 - 7.7 K/uL   Lymphocytes Relative 28 %   Lymphs Abs  3.8 0.7 - 4.0 K/uL   Monocytes Relative 9 %   Monocytes Absolute 1.2 (H) 0.1 - 1.0 K/uL   Eosinophils Relative 1 %   Eosinophils Absolute 0.1 0.0 - 0.5 K/uL   Basophils Relative 0 %   Basophils Absolute 0.0 0.0 - 0.1 K/uL   Immature Granulocytes 1 %   Abs Immature Granulocytes 0.17 (H) 0.00 - 0.07 K/uL    Comment: Performed at [facility], [address] [place], [place] [address]  Comprehensive metabolic panel     Status: Abnormal   Collection Time: 09/29/20  1:06 AM  Result Value Ref Range   Sodium 140 135 - 145 mmol/L   Potassium 4.7 3.5 - 5.1 mmol/L   Chloride 105 98 - 111 mmol/L   CO2 24 22 - 32 mmol/L   Glucose, Bld 117 (H) 70 - 99 mg/dL    Comment: Glucose reference range applies only to samples taken after fasting for at least 8 hours.   BUN 25 (H) 8 - 23 mg/dL   Creatinine, Ser 1.15 0.61 - 1.24 mg/dL   Calcium 9.3 8.9 - 10.3 mg/dL   Total Protein 6.7 6.5 - 8.1 g/dL   Albumin 2.6 (L) 3.5 - 5.0 g/dL   AST 29 15 - 41 U/L   ALT  34 0 - 44 U/L   Alkaline Phosphatase 48 38 - 126 U/L   Total Bilirubin 0.6 0.3 - 1.2 mg/dL   GFR, Estimated >60 >60 mL/min    Comment: (NOTE) Calculated using the CKD-EPI Creatinine Equation (2021)    Anion gap 11 5 - 15    Comment: Performed at [facility] [address], [place], [place] [address]  Magnesium     Status: None   Collection Time: 09/29/20  1:06 AM  Result Value Ref Range   Magnesium 2.1 1.7 - 2.4 mg/dL    Comment: Performed at [facility] [address], [place], [place] [address]  Phosphorus     Status: None   Collection Time: 09/29/20  1:06 AM  Result Value Ref Range   Phosphorus 4.6 2.5 - 4.6 mg/dL    Comment: Performed at [facility] [address] [place], [place] [address]  Uric acid     Status: Abnormal   Collection Time: 09/29/20  1:06 AM  Result Value Ref Range   Uric Acid, Serum 9.2 (H) 3.7 - 8.6 mg/dL    Comment: Performed at [facility] [address] [place], [place]  [address]  Glucose, capillary     Status: None   Collection Time: 09/29/20  7:45 AM  Result Value Ref Range   Glucose-Capillary 85 70 - 99 mg/dL    Comment: Glucose reference range applies only to samples taken after fasting for at least 8 hours.    No results found.  Review of Systems  HENT: Negative for ear discharge, ear pain, hearing loss and tinnitus.   Eyes: Negative for photophobia and pain.  Respiratory: Negative for cough and shortness of breath.   Cardiovascular: Negative for chest pain.  Gastrointestinal: Negative for abdominal pain, nausea and vomiting.  Genitourinary: Negative for dysuria, flank pain, frequency and urgency.  Musculoskeletal: Positive for arthralgias (Bilateral knees L>R). Negative for back pain, myalgias and neck pain.  Neurological: Negative for dizziness and headaches.  Hematological: Does not bruise/bleed easily.  Psychiatric/Behavioral: The patient is not nervous/anxious.    Blood pressure 129/84, pulse 85, temperature 98 F (36.7 C), temperature source Oral, resp. rate 19, height _0  (1.676 m), weight 78.6 kg, SpO2 97 %. Physical Exam Constitutional:      General: He is not in acute distress.    Appearance: He is well-developed and well-nourished. He is not diaphoretic.  HENT:     Head: Normocephalic and atraumatic.  Eyes:     General: No scleral icterus.       Right eye: No discharge.        Left eye: No discharge.     Conjunctiva/sclera: Conjunctivae normal.  Cardiovascular:     Rate and Rhythm: Normal rate and regular rhythm.  Pulmonary:     Effort: Pulmonary effort is normal. No respiratory distress.  Musculoskeletal:     Cervical back: Normal range of motion.     Comments: LLE No traumatic wounds, ecchymosis, or rash  Nontender  No ankle effusion, poss mild effusion on left  Knee stable to varus/ valgus and anterior/posterior stress  Sens DPN, SPN, TN intact  Motor EHL, ext, flex, evers 5/5  DP 2+, PT 0, No significant edema    Skin:    General: Skin is warm and dry.  Neurological:     Mental Status: He is alert.  Psychiatric:        Mood and Affect: Mood and affect normal.  Behavior: Behavior normal.     Assessment/Plan: Left knee pain -- OA vs gout. Was unable to obtain fluid with first attempt, pt refused any further attempts. He is cleared for discharge from ortho standpoint and can f/u with Dr. Erlinda Hong as an outpatient.    Lisette Abu, PA-C Orthopedic Surgery [phone] 09/29/2020, 10:39 AM

## 2020-09-29 NOTE — TOC Transition Note (Signed)
Transition of Care [facility]) - CM/SW Discharge Note   Patient Details  Name: Todd [name]. MRN: [id number] Date of Birth: [birth year]  [facility] CM/SW Contact:  Angelita Ingles, RN Phone Number: [phone]  09/29/2020, 12:39 PM   Clinical Narrative:    Patient to be discharged to [facility]. Discharge summary has been faxed to facility. Patient is aware and has no contact person that he wants notified of discharge. Bedside nurse has been notified. Transport has been set up with [facility]. D/c packet is at nurses station. TOC will sign off.   Please call report to  [facility] Room # 121A     Final next level of care: Skilled Nursing Facility Barriers to Discharge: No Barriers Identified   Patient Goals and CMS Choice Patient states their goals for this hospitalization and ongoing recovery are:: Patient wants to get independent living apartment CMS Medicare.gov Compare Post Acute Care list provided to:: Patient Choice offered to / list presented to : Patient  Discharge Placement              Patient chooses bed at:  [facility]) Patient to be transferred to facility by: [place] Name of family member notified: none per patient request    Discharge Plan and Services In-house Referral: NA Discharge Planning Services: CM Consult            DME Arranged: N/A DME Agency: NA       HH Arranged: NA HH Agency: NA        Social Determinants of Health ([place]) Interventions     Readmission Risk Interventions Readmission Risk Prevention Plan 09/16/2020  Transportation Screening Complete  PCP or Specialist Appt within 5-7 Days Complete  Home Care Screening Complete  Medication Review (RN CM) Referral to Pharmacy  Some recent data might be hidden

## 2020-09-29 NOTE — Procedures (Signed)
Procedure: Left knee aspiration and injection  Indication: Left knee effusion(s)  Surgeon: Silvestre Gunner, PA-C  Assist: None  Anesthesia: Topical refrigerant  EBL: None  Complications: Dry tap  Findings: After risks/benefits explained patient desires to undergo procedure. Consent obtained and time out performed. The left knee was sterilely prepped and aspirated. Unable to aspirate any fluid, pt terminated procedure 2/2 pain.     Lisette Abu, PA-C Orthopedic Surgery [phone]

## 2020-09-29 NOTE — Discharge Summary (Addendum)
Physician Discharge Summary  Todd [name]. UXL:[id number] DOB: March 18, 1956 DOA: 09/03/2020  PCP: [name], MD  Admit date: 09/03/2020 Discharge date: 09/29/2020  Admitted From: Homeless Shelter Disposition: SNF   Recommendations for Outpatient Follow-up:  1. Follow up with PCP in 1-2 weeks 2. Follow up as an outpatient with Cardiothoracic Surgery for Dilated Aorta 3. Follow up with Cardiology as an outpatient of ischemic Testing 4. No Driving for 6 months per  Law 5. Follow up with Neurology as an outpatient setting for Seizure Follow up as no AEDs were recommended  6. Follow up with Gastroenterology as an outpatient setting  7. Follow up with Orthopedic Surgery for Left Knee within 1-2 weeks 8. Repeat CXR in 3-6 weeks 9. Please obtain CMP/CBC, Mag, Phos in one week 10. Please follow up on the following pending results:  Home Health: No Equipment/Devices: None    Discharge Condition: Stable CODE STATUS: FULL CODE Diet recommendation: Heart Healthy Diet   Brief/Interim Summary: Patient is a 65 year old African-American male with a past medical history significant for but not limited to diabetes mellitus type 2, hypertension, CKD stage IIIa, alcohol and tobacco abuse overall history of heroin abuse who presented from his homeless shelter when he was found down.  He was intubated in the ER under suspicion for seizure-like activity after intubation.  He was also found to be COVID-19 positive but was not treated with remdesivir or steroids at that time as symptoms were felt to be from aspiration.  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  EEG was done and showed diffuse encephalopathy.  LP was also done and was negative for meningitis.  He initially received IV vancomycin, IV Zosyn and IV acyclovir which was later transitioned to Unasyn for aspiration pneumonia.  He was seen by cardiology due to elevated troponin and they  recommended outpatient ischemic work-up.  Also seen by GI due to severe transaminitis and advised to continue to monitor.  LFTs have improved and almost resolved.  PT OT recommending SNF.  He is deemed stable to be discharged wanted to go back to the [facility] however they requested a negative Covid test which was unreasonable and because his repeat Covid test on Saturday was still positive they discharged the patient from [facility] and he will need to go to SNF now that he longer has a bed available at [facility].  Transitions of care assisting with placement.  Patient continues to complain of left knee pain and apparently did not have a brace with this was ordered.  He likely has gout given his elevated uric acid level so we will start him on prednisone as well as colchicine.  His pain continues to be uncontrolled but is improving and so we will start IV ketorolac as needed continue IV fentanyl as needed as well.  Orthopedic surgery was consulted given his continued knee pain for further evaluation and recommendations and attempted to aspirate and inject the knee with steroids but patient had too much pain so it was aborted. Ortho has cleared the patient. Will need to follow up with Cardiology, PCP, Neurology, Gastroenterology, Cardiothoracic Surgery, as well as an Orthopedic Surgery as he is stable to D/C   Discharge Diagnoses:  Active Problems:   Acute respiratory failure with hypoxia (HCC)  Acute hypoxic respiratory failure due to COVID-19 infection, aspiration pneumonia -Initially admitted to the ICU and was intubated in the ED as he was found down. Successfully  extubated on 1/20, currently on room air. Due to bilateral patchy infiltrates and recent fevers, elevated procalcitonin he completed 5 days of ceftriaxone and azithromycin. He initially tested positive for 1/19, given respiratory involvement will need to be isolated for 21 days up until 2/9. Did not receive  Remdesivir or steroids on admission.  -Remains on room air and respiratory status is stable but has been having a slight cough -Repeat CXR ordered and showed "Stable cardiomediastinal silhouette. No pneumothorax or pleural effusion is noted. Stable patchy airspace opacities are noted consistent multifocal pneumonia. Bony thorax is unremarkable." -SpO2: 96 % FiO2 (%): 40 % -Continue to Monitor Respiratory Status carefully and is currently on room air -We will need an ambulatory home O2 screen prior to discharge  Acute Metabolic encephalopathy, unresponsive episode, Acute Seizure improved  -there were initial concerns for acute meningitis/encephalitis. He was initially on broad-spectrum antibiotics with vancomycin, Zosyn, acyclovir, underwent an LP which was negative.  -Antibiotics were discontinued. He underwent an EEG which showed diffuse encephalopathy without seizures, and UDS was negative. Mental status returned to baseline and not on AEDs -Follow up with Neurology as an outpatient   Elevated Troponin -2D echo showed EF 63%, grade 1 diastolic dysfunction.  -Cardiology saw him and recommended for outpatient ischemic evaluation on discharge  Possible Balanitis, candidal infection -Started clotrimazole 1% twice daily for 7 days on 2/5. Improving  Osteoarthritis, chronic back pain, chronic left knee pain and now acute pain in the setting of likely gout -Patient got a brace for his back, left knee sleeve -Repeat Knee X-Ray obtained Mild degenerative joint disease. No acute abnormality seen in the left knee -Will obtain Uric Acid Level to evaluate for Gout in the AM; uric acid level was elevated at 9.1 and improved to 9.0 but then yesterday went to 9.2 and then today is 9.0 again -Given 1x of IV Fentanyl the day before yesterday but will start 12.5 mcg q6hprn and write for Tramadol as an outpatient  -Start him on gout treatment with prednison, colchicine 0.6 mg twice daily  And  cutting down to 0.6 Daily and will need to go on Alloupurinol  -C/w IV Ketorolac 15 mg as needed and will give a 1x of IV 30 mg x1 -Increase Prednisone to 60 mg Daily and will continue and start Prednisone Taper prior to D/C  -Obtain new Knee Sleeve and has now fitting well -Orthopedic surgery was consulted for further evaluation given continued his knee pain and they did a joint aspiration and were going to do an injection but unable to obtain fluid with the first attempt and patient refused other attempts. Ortho has signed off and recommending outpatient follow up  Acute kidney injury on chronic kidney disease stage IIIa, improved  -Creatinine at baseline and today his BUN/creatinine 25/1.15 -Avoid nephrotoxic medications, contrast dyes, hypotension and renally dose medications  -repeat CMP in a.m.  Transaminitis, alcohol pattern, history of hep C -significant elevated liver enzymes on admission, concern for shock liver versus ischemic hepatitis versus metabolic hepatitis. GI consulted and evaluated patient. LFTs normalized as AST is now 29 and ALT is 34  Mild Rhabdomyolysis -Resolved with IV fluids  Diarrhea -C. difficile negative, possibly due to COVID-19 -Continue to monitor and trend as this is improving  -Follow up as an outpatient   Microcytic Anemia -Suspect from underlying alcohol use. Hemoglobin stable and is actually improved.  Now hemoglobin/hematocrit is stable at 11.9/37.5 -Repeat CBC in the a.m. and continue to monitor for signs or symptoms  of bleeding; no overt bleeding noted  Hypophosphatemia -Phos Level was 4.6 -Continue to Monitor and Trend -Repeat Phos Level in the AM  Hypomagnesemia -Patient magnesium level was 2.1 -continue monitor replete as necessary -Repeat Mag Level in the AM   Leukocytosis -2/2 To Steroid Demargination -WBC went from 5.0 -> 11.5 -> 13.0 and is now trended down to 11.6 yesterday and today is 13.8 -Continue to Monitor and  Trend and evaluate for S/Sx of Infection -Repeat CBC in the AM   History of esophagitis, ulcerative -Continue PPI 40 mg po Daily   History of DVT, PE -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 He was on Coumadin in the past and no longer taking it  -Continue to Monitor as an outpatient   History of Dilated Aorta -Outpatient follow-up with cardiothoracic surgery  Depression -Longstanding; No SI or HI -Resume Sertraline 25 mg po Daily  -Follow up with outpatient Psychiatry   UTI with sepsis, not present on admission -Had a recent fever with increased lethargy and UA suggestive of UTI.  He did complain of dysuria -Was on Ceftriaxone and received 5 days and stopped as sepsis physiology resolved  Disposition-Was Stable to be discharged, howeverdid not [facility]. He just tested positive again on 2/5. They are not going to hold his bed anymore, and they brought his belongings to the Hospital. He is now agreeable to going to SNF and he only has 1 bed offer available however he wants to discuss with other bed offers but this is not possible as no one else is willing to accept the patient. Stable to D/C to SNF  HTN -C/w Amlodipine 5 mg po Daily   Discharge Instructions  Discharge Instructions    Call MD for:  difficulty breathing, headache or visual disturbances   Complete by: As directed    Call MD for:  extreme fatigue   Complete by: As directed    Call MD for:  hives   Complete by: As directed    Call MD for:  persistant dizziness or light-headedness   Complete by: As directed    Call MD for:  persistant nausea and vomiting   Complete by: As directed    Call MD for:  redness, tenderness, or signs of infection (pain, swelling, redness, odor or green/yellow discharge around incision site)   Complete by: As  directed    Call MD for:  severe uncontrolled pain   Complete by: As directed    Call MD for:  temperature >100.4   Complete by: As directed    Diet - low sodium heart healthy   Complete by: As directed    Discharge instructions   Complete by: As directed    You were cared for by a hospitalist during your hospital stay. If you have any questions about your discharge medications or the care you received while you were in the hospital after you are discharged, you can call the unit and ask to speak with the hospitalist on call if the hospitalist that took care of you is not available. Once you are discharged, your primary care physician will handle any further medical issues. Please note that NO REFILLS for any discharge medications will be authorized once you are discharged, as it is imperative that you return to your primary care physician (or establish a relationship with a primary care physician if you do not  have one) for your aftercare needs so that they can reassess your need for medications and monitor your lab values.  Follow up with PCP and Orthopedic Surgery. Take all medications as prescribed. If symptoms change or worsen please return to the ED for evaluation   Increase activity slowly   Complete by: As directed      Allergies as of 09/29/2020   No Known Allergies     Medication List    STOP taking these medications   etodolac 300 MG capsule Commonly known as: LODINE   predniSONE 50 MG tablet Commonly known as: DELTASONE Replaced by: predniSONE 10 MG (21) Tbpk tablet     TAKE these medications   acetaminophen 325 MG tablet Commonly known as: TYLENOL Take 2 tablets (650 mg total) by mouth every 8 (eight) hours as needed for mild pain, fever or headache.   ACID CONTROLLER PO Take 1 tablet by mouth daily as needed (reflux).   albuterol (2.5 MG/3ML) 0.083% nebulizer solution Commonly known as: PROVENTIL Take 3 mLs (2.5 mg total) by nebulization every 2 (two) hours as  needed for wheezing.   amLODipine 5 MG tablet Commonly known as: NORVASC Take 1 tablet (5 mg total) by mouth daily. Start taking on: September 30, 2020 What changed:   medication strength  how much to take   aspirin EC 81 MG tablet Take 1 tablet (81 mg total) by mouth daily. Swallow whole.   clotrimazole 1 % cream Commonly known as: LOTRIMIN Apply topically 2 (two) times daily.   colchicine 0.6 MG tablet Take 1 tablet (0.6 mg total) by mouth daily.   dextromethorphan-guaiFENesin 30-600 MG 12hr tablet Commonly known as: MUCINEX DM Take 1 tablet by mouth 2 (two) times daily as needed for cough.   docusate sodium 100 MG capsule Commonly known as: COLACE Take 1 capsule (100 mg total) by mouth 2 (two) times daily.   folic acid 1 MG tablet Commonly known as: FOLVITE Take 1 tablet (1 mg total) by mouth daily. Start taking on: September 30, 2020   metoprolol tartrate 25 MG tablet Commonly known as: LOPRESSOR Take 1 tablet (25 mg total) by mouth 2 (two) times daily.   multivitamin with minerals Tabs tablet Take 1 tablet by mouth daily. Start taking on: September 30, 2020   pantoprazole 40 MG tablet Commonly known as: PROTONIX Take 1 tablet (40 mg total) by mouth at bedtime. What changed: when to take this   polyethylene glycol 17 g packet Commonly known as: MIRALAX / GLYCOLAX Take 17 g by mouth daily. Start taking on: September 30, 2020   predniSONE 10 MG (21) Tbpk tablet Commonly known as: STERAPRED UNI-PAK 21 TAB Take 6 pills on day 1, 5 pills on day 2, 4 pills on day 3, 3 pills on day 4, 2 pills on day 5, 1 pill on day 6 and then stop on day 7 Replaces: predniSONE 50 MG tablet   sertraline 25 MG tablet Commonly known as: ZOLOFT Take 1 tablet (25 mg total) by mouth daily.   sucralfate 1 GM/10ML suspension Commonly known as: CARAFATE Take 10 mLs (1 g total) by mouth 4 (four) times daily -  with meals and at bedtime.   thiamine 100 MG tablet Take 1 tablet (100 mg  total) by mouth daily. Start taking on: September 30, 2020   traMADol 50 MG tablet Commonly known as: ULTRAM Take 1 tablet (50 mg total) by mouth every 6 (six) hours as needed for moderate pain or severe pain.  Follow-up Information    [name], MD. Call.   Specialty: Internal Medicine Why: Follow up within 1-2 weeks  Contact information: [address] [place] [address] [phone]        Leandrew Koyanagi, MD. Call.   Specialty: Orthopedic Surgery Why: Follow up within 1-2 weeks Contact information: [address] [place] [address] [phone]              No Known Allergies  Consultations:  PCCM Transfer  Orthopedic Surgery  Cardiology  Neurology  Gastroenterology   Procedures/Studies: EEG  Result Date: 09/03/2020 Lora Havens, MD     09/03/2020  7:53 PM Patient Name: Todd [name]. MRN: [id number] Epilepsy Attending: Lora Havens Referring Physician/Provider: Dr Sherwood Gambler Date: 09/04/2020 Duration: 22.44 mins Patient history: 65yo M with ams. EEG to evaluate for seizure Level of alertness:  comatose AEDs during EEG study: Propofol Technical aspects: This EEG study was done with scalp electrodes positioned according to the 10-20 International system of electrode placement. Electrical activity was acquired at a sampling rate of 500Hz  and reviewed with a high frequency filter of 70Hz  and a low frequency filter of 1Hz . EEG data were recorded continuously and digitally stored. Description: EEG showed continuous generalized 3 to 6 Hz theta-delta slowing admixed with intermittent 15-18hz  beta activity in frontocentral region.  Hyperventilation and photic stimulation were not performed.   ABNORMALITY -Continuous slow, generalized IMPRESSION: This study is suggestive of severe diffuse encephalopathy, nonspecific etiology but likely related to sedation. No seizures or epileptiform discharges were seen throughout the  recording. Lora Havens   DG Chest 1 View  Result Date: 09/15/2020 CLINICAL DATA:  Fever. EXAM: CHEST  1 VIEW COMPARISON:  09/04/2020 FINDINGS: Bilateral patchy and nodular airspace disease is evident. No substantial pleural effusion. Cardiopericardial silhouette is at upper limits of normal for size. The visualized bony structures of the thorax show no acute abnormality. IMPRESSION: Bilateral patchy and nodular airspace disease compatible with multifocal pneumonia. Electronically Signed   By: Misty Stanley M.D.   On: 09/15/2020 14:19   DG Knee 1-2 Views Left  Result Date: 09/24/2020 CLINICAL DATA:  Chronic left knee pain without known injury. EXAM: LEFT KNEE - 1-2 VIEW COMPARISON:  None. FINDINGS: No evidence of fracture, dislocation, or joint effusion. Mild narrowing of medial and lateral joint spaces are noted with osteophyte formation. Soft tissues are unremarkable. IMPRESSION: Mild degenerative joint disease. No acute abnormality seen in the left knee. Electronically Signed   By: Marijo Conception M.D.   On: 09/24/2020 11:01   CT Head Wo Contrast  Result Date: 09/03/2020 CLINICAL DATA:  Cerebral hemorrhage suspected, mental status change, unknown cause. EXAM: CT HEAD WITHOUT CONTRAST TECHNIQUE: Contiguous axial images were obtained from the base of the skull through the vertex without intravenous contrast. COMPARISON:  Head CT 04/03/2019. FINDINGS: Brain: Mild cerebral atrophy. Subtle ill-defined hypoattenuation within the bilateral thalami (for instance as seen on series 3, image 15). Moderate ill-defined hypoattenuation within the cerebral white matter is nonspecific. There is no acute intracranial hemorrhage. No demarcated cortical infarct. No extra-axial fluid collection. No evidence of intracranial mass. No midline shift. Vascular: No hyperdense vessel.  Atherosclerotic calcifications Skull: Normal. Negative for fracture or focal lesion. Sinuses/Orbits: Visualized orbits show no acute finding.  Chronic medially displaced fracture deformity of the left lamina papyracea. Mild bilateral ethmoid and sphenoid sinus mucosal thickening. Small left maxillary sinus mucous retention cyst. Secretions within the bilateral nasal passages. IMPRESSION: Subtle ill-defined  hypoattenuation within the bilateral thalami. Findings may reflect progressive chronic small vessel ischemic disease. Subtle changes of hypoxic/ischemic injury cannot be excluded. Consider brain MRI for further evaluation, as clinically warranted. No acute intracranial hemorrhage or acute demarcated cortical infarction. Moderate cerebral white matter disease, nonspecific but most often secondary to chronic small vessel ischemia. Mild cerebral atrophy Paranasal sinus disease as described. Electronically Signed   By: Kellie Simmering DO   On: 09/03/2020 17:59   DG CHEST PORT 1 VIEW  Result Date: 09/24/2020 CLINICAL DATA:  Shortness of breath. EXAM: PORTABLE CHEST 1 VIEW COMPARISON:  September 18, 2020. FINDINGS: Stable cardiomediastinal silhouette. No pneumothorax or pleural effusion is noted. Stable patchy airspace opacities are noted consistent multifocal pneumonia. Bony thorax is unremarkable. IMPRESSION: Stable bilateral multifocal pneumonia. Electronically Signed   By: Marijo Conception M.D.   On: 09/24/2020 11:02   DG CHEST PORT 1 VIEW  Result Date: 09/18/2020 CLINICAL DATA:  Dyspnea EXAM: PORTABLE CHEST 1 VIEW COMPARISON:  Portable exam 0824 hours compared to 09/15/2020 FINDINGS: Normal heart size, mediastinal contours, and pulmonary vascularity. Persistent scattered BILATERAL pulmonary infiltrates consistent with multifocal pneumonia. No pleural effusion or pneumothorax. Osseous structures unremarkable. IMPRESSION: Patchy BILATERAL pulmonary infiltrates consistent with multifocal pneumonia, little changed. Electronically Signed   By: Lavonia Dana M.D.   On: 09/18/2020 08:44   DG Chest Port 1 View  Result Date: 09/04/2020 CLINICAL DATA:   Intubation. EXAM: PORTABLE CHEST 1 VIEW COMPARISON:  09/03/2020. FINDINGS: Patient is rotated to the right. Endotracheal tube 3 cm above the carina. NG tube in stable position. Heart size normal. Low lung volumes with bibasilar atelectasis. Bilateral interstitial prominence noted on today's exam. Interstitial edema and or pneumonitis could present this fashion. No prominent pleural effusion. No pneumothorax. IMPRESSION: 1. Endotracheal tube 3 cm above the carina. NG tube in stable position. 2. Low lung volumes with bibasilar atelectasis. Bilateral interstitial prominence noted on today's exam. Interstitial edema and or pneumonitis could present this fashion. Electronically Signed   By: Marcello Moores  Register   On: 09/04/2020 05:32   DG Chest Portable 1 View  Result Date: 09/03/2020 CLINICAL DATA:  Unresponsiveness EXAM: PORTABLE CHEST 1 VIEW COMPARISON:  08/01/2020 FINDINGS: Endotracheal tube terminates 1.8 cm above the carina. Enteric tube courses below the diaphragm with distal tip beyond the inferior margin of the film. Heart size is within normal limits. Slight prominence of the superior mediastinal contours favored secondary to AP portable technique and slight rotation. No focal airspace consolidation, pleural effusion, or pneumothorax. Degenerative changes of the bilateral shoulders. No acute osseous abnormality. IMPRESSION: 1. Endotracheal tube terminates 1.8 cm above the carina. Consider retraction 1-2 cm for more optimal positioning. 2. No acute cardiopulmonary findings. Electronically Signed   By: Davina Poke D.O.   On: 09/03/2020 16:57   VAS [place] LOWER EXTREMITY VENOUS (DVT)  Result Date: 09/04/2020  Lower Venous DVT Study Indications: Swelling. Other Indications: Covid. Risk Factors: DVT Hx Chronic DVT left. Anticoagulation: Heparin. Comparison Study: Previous 07/14/2018 Chronic DVT left Performing Technologist: Vonzell Schlatter RVT  Examination Guidelines: A complete evaluation includes B-mode imaging,  spectral Doppler, color Doppler, and power Doppler as needed of all accessible portions of each vessel. Bilateral testing is considered an integral part of a complete examination. Limited examinations for reoccurring indications may be performed as noted. The reflux portion of the exam is performed with the patient in reverse Trendelenburg.  +-----+---------------+---------+-----------+----------+--------------+ RIGHTCompressibilityPhasicitySpontaneityPropertiesThrombus Aging +-----+---------------+---------+-----------+----------+--------------+ CFV  Full           Yes  Yes                                 +-----+---------------+---------+-----------+----------+--------------+ SFJ  Full                                                        +-----+---------------+---------+-----------+----------+--------------+   +---------+---------------+---------+-----------+----------+--------------+ LEFT     CompressibilityPhasicitySpontaneityPropertiesThrombus Aging +---------+---------------+---------+-----------+----------+--------------+ CFV      Full           Yes      Yes                                 +---------+---------------+---------+-----------+----------+--------------+ SFJ      Full                                                        +---------+---------------+---------+-----------+----------+--------------+ FV Prox  Partial                                                     +---------+---------------+---------+-----------+----------+--------------+ FV Mid   Partial                                                     +---------+---------------+---------+-----------+----------+--------------+ FV DistalPartial                                                     +---------+---------------+---------+-----------+----------+--------------+ PFV      Partial                                                      +---------+---------------+---------+-----------+----------+--------------+ POP      Partial        Yes      Yes                                 +---------+---------------+---------+-----------+----------+--------------+ PTV      Full                                                        +---------+---------------+---------+-----------+----------+--------------+ PERO     Full                                                        +---------+---------------+---------+-----------+----------+--------------+  Summary: RIGHT: - No evidence of common femoral vein obstruction.  LEFT: - Findings consistent with chronic deep vein thrombosis involving the left femoral vein, and left popliteal vein. - No cystic structure found in the popliteal fossa.  *See table(s) above for measurements and observations. Electronically signed by Servando Snare MD on 09/04/2020 at 2:28:05 PM.    Final    ECHOCARDIOGRAM LIMITED  Result Date: 09/05/2020    ECHOCARDIOGRAM LIMITED REPORT   Patient Name:   CECILE GUEVARA Date of Exam: 09/05/2020 Medical Rec #:  [id number]      Height:       66.0 in Accession #:    [id number]     Weight:       196.2 lb Date of Birth:  [birth year]      BSA:          1.984 m Patient Age:    65 years       BP:           181/89 mmHg Patient Gender: M              HR:           77 bpm. Exam Location:  Inpatient Procedure: Limited Echo, Limited Color Doppler and Cardiac Doppler Indications:    syncope 780.2  History:        Patient has prior history of Echocardiogram examinations, most                 recent 07/14/2018. Covid, Arrythmias:RBBB; Risk                 Factors:Hypertension and Diabetes.  Sonographer:    Johny Chess Referring Phys: [id number] ANKIT CHIRAG AMIN IMPRESSIONS  1. Left ventricular ejection fraction, by estimation, is 60 to 65%. The left ventricle has normal function. Left ventricular diastolic parameters are consistent with Grade I diastolic dysfunction (impaired  relaxation).  2. The mitral valve is normal in structure. Mild mitral valve regurgitation. No evidence of mitral stenosis.  3. The aortic valve is tricuspid. Aortic valve regurgitation is not visualized. No aortic stenosis is present.  4. There is normal pulmonary artery systolic pressure.  5. The inferior vena cava is dilated in size with >50% respiratory variability, suggesting right atrial pressure of 8 mmHg. FINDINGS  Left Ventricle: Left ventricular ejection fraction, by estimation, is 60 to 65%. The left ventricle has normal function. Left ventricular diastolic parameters are consistent with Grade I diastolic dysfunction (impaired relaxation). Normal left ventricular filling pressure. Right Ventricle: There is normal pulmonary artery systolic pressure. The tricuspid regurgitant velocity is 2.50 m/s, and with an assumed right atrial pressure of 8 mmHg, the estimated right ventricular systolic pressure is 37.6 mmHg. Mitral Valve: The mitral valve is normal in structure. Mild mitral valve regurgitation. No evidence of mitral valve stenosis. Tricuspid Valve: The tricuspid valve is normal in structure. Tricuspid valve regurgitation is mild . No evidence of tricuspid stenosis. Aortic Valve: The aortic valve is tricuspid. Aortic valve regurgitation is not visualized. No aortic stenosis is present. Pulmonic Valve: The pulmonic valve was normal in structure. Pulmonic valve regurgitation is trivial. No evidence of pulmonic stenosis. Aorta: The aortic root is normal in size and structure. Venous: The inferior vena cava is dilated in size with greater than 50% respiratory variability, suggesting right atrial pressure of 8 mmHg. LEFT VENTRICLE PLAX 2D LVIDd:         4.90 cm  Diastology LVIDs:         3.30 cm  LV e' medial:    8.92 cm/s LV PW:         1.10 cm  LV E/e' medial:  8.2 LV IVS:        0.90 cm  LV e' lateral:   11.30 cm/s LVOT diam:     2.10 cm  LV E/e' lateral: 6.4 LV SV:         101 LV SV Index:   51 LVOT Area:      3.46 cm  IVC IVC diam: 2.50 cm LEFT ATRIUM         Index LA diam:    4.00 cm 2.02 cm/m  AORTIC VALVE LVOT Vmax:   146.00 cm/s LVOT Vmean:  96.800 cm/s LVOT VTI:    0.292 m  AORTA Ao Root diam: 3.30 cm Ao Asc diam:  3.30 cm MITRAL VALVE               TRICUSPID VALVE MV Area (PHT): 3.03 cm    TR Peak grad:   25.0 mmHg MV Decel Time: 250 msec    TR Vmax:        250.00 cm/s MV E velocity: 72.80 cm/s MV A velocity: 96.00 cm/s  SHUNTS MV E/A ratio:  0.76        Systemic VTI:  0.29 m                            Systemic Diam: 2.10 cm Skeet Latch MD Electronically signed by Skeet Latch MD Signature Date/Time: 09/05/2020/2:37:24 PM    Final    US Abdomen Limited RUQ (LIVER/GB)  Result Date: 09/05/2020 CLINICAL DATA:  Transaminitis Hepatitis C Hypertension Type 2 diabetes EXAM: ULTRASOUND ABDOMEN LIMITED RIGHT UPPER QUADRANT COMPARISON:  CT abdomen pelvis 06/19/2019 FINDINGS: Gallbladder: Surgically absent Common bile duct: Diameter: 2 mm Liver: No focal lesion identified. Within normal limits in parenchymal echogenicity. Portal vein is patent on color Doppler imaging with normal direction of blood flow towards the liver. Other: None. IMPRESSION: No significant abnormality of the liver. Electronically Signed   By: Miachel Roux M.D.   On: 09/05/2020 11:14    Subjective: Seen and frustrated about his knee but states is better.  No nausea or vomiting.  No lightheadedness or dizziness.  No other concerns or complaints at this time and is stable for discharge to SNF.  Discharge Exam: Vitals:   09/29/20 0933 09/29/20 0940  BP: 129/84   Pulse:  85  Resp:    Temp:    SpO2:     Vitals:   09/28/20 1422 09/28/20 2048 09/29/20 0933 09/29/20 0940  BP: (!) 142/91 128/84 129/84   Pulse: 82 78  85  Resp: 17 19    Temp: 98.5 F (36.9 C) 98 F (36.7 C)    TempSrc:  Oral    SpO2: 96% 97%    Weight:      Height:       General: Pt is alert, awake, not in acute distress Cardiovascular: RRR, S1/S2 +, no  rubs, no gallops Respiratory: CTA bilaterally, no wheezing, no rhonchi diminished; unlabored breathing Abdominal: Soft, NT, nondistended, bowel sounds + Extremities: No appreciable edema, no cyanosis  The results of significant diagnostics from this hospitalization (including imaging, microbiology, ancillary and laboratory) are listed below for reference.    Microbiology: Recent Results (from the past 240 hour(s))  SARS CORONAVIRUS 2 (TAT 6-24 HRS) Nasopharyngeal Nasopharyngeal Swab     Status: Abnormal   Collection  Time: 09/20/20 10:19 AM   Specimen: Nasopharyngeal Swab  Result Value Ref Range Status   SARS Coronavirus 2 POSITIVE (A) NEGATIVE Final    Comment: (NOTE) SARS-CoV-2 target nucleic acids are DETECTED.  The SARS-CoV-2 RNA is generally detectable in upper and lower respiratory specimens during the acute phase of infection. Positive results are indicative of the presence of SARS-CoV-2 RNA. Clinical correlation with patient history and other diagnostic information is  necessary to determine patient infection status. Positive results do not rule out bacterial infection or co-infection with other viruses.  The expected result is Negative.  Fact Sheet for Patients: SugarRoll.be  Fact Sheet for Healthcare Providers: https://www.woods-mathews.com/  This test is not yet approved or cleared by the [place] FDA and  has been authorized for detection and/or diagnosis of SARS-CoV-2 by FDA under an Emergency Use Authorization (EUA). This EUA will remain  in effect (meaning this test can be used) for the duration of the COVID-19 declaration under Section 564(b)(1) of the Act, 21 U. S.C. section 360bbb-3(b)(1), unless the authorization is terminated or revoked sooner.   Performed at [facility], [place] [address], [place], [place] [address]      Labs: BNP (last 3 results) Recent Labs    09/08/20 0453 09/11/20 0658  09/14/20 0418  BNP 324.2* 67.0 10.6   Basic Metabolic Panel: Recent Labs  Lab 09/25/20 0233 09/26/20 0305 09/27/20 0359 09/28/20 0338 09/29/20 0106  NA 141 139 140 141 140  K 4.5 5.0 4.4 4.3 4.7  CL 107 107 105 105 105  CO2 24 23 24 26 24   GLUCOSE 106* 166* 123* 107* 117*  BUN 13 17 20 17  25*  CREATININE 1.21 1.19 1.17 1.14 1.15  CALCIUM 9.3 9.6 9.4 9.4 9.3  MG 1.6* 1.9 1.6* 1.8 2.1  PHOS 3.5 2.1* 3.2 4.2 4.6   Liver Function Tests: Recent Labs  Lab 09/25/20 0233 09/26/20 0305 09/27/20 0359 09/28/20 0338 09/29/20 0106  AST 36 27 32 39 29  ALT 36 30 33 37 34  ALKPHOS 55 51 47 49 48  BILITOT 0.5 0.4 0.4 0.6 0.6  PROT 7.0 6.9 6.8 6.8 6.7  ALBUMIN 2.5* 2.5* 2.6* 2.6* 2.6*   No results for input(s): LIPASE, AMYLASE in the last 168 hours. No results for input(s): AMMONIA in the last 168 hours. CBC: Recent Labs  Lab 09/25/20 0233 09/26/20 0305 09/27/20 0359 09/28/20 0338 09/29/20 0106  WBC 5.0 11.5* 13.0* 11.6* 13.8*  NEUTROABS 2.2 7.7 8.5* 6.7 8.5*  HGB 11.9* 11.9* 11.8* 12.0* 11.9*  HCT 38.2* 36.5* 36.5* 38.5* 37.5*  MCV 97.0 95.3 96.3 97.2 95.4  PLT 368 351 348 348 346   Cardiac Enzymes: No results for input(s): CKTOTAL, CKMB, CKMBINDEX, TROPONINI in the last 168 hours. BNP: Invalid input(s): POCBNP CBG: Recent Labs  Lab 09/28/20 0746 09/28/20 1156 09/28/20 1549 09/28/20 1944 09/29/20 0745  GLUCAP 120* 158* 154* 166* 85   D-Dimer No results for input(s): DDIMER in the last 72 hours. Hgb A1c No results for input(s): HGBA1C in the last 72 hours. Lipid Profile No results for input(s): CHOL, HDL, LDLCALC, TRIG, CHOLHDL, LDLDIRECT in the last 72 hours. Thyroid function studies No results for input(s): TSH, T4TOTAL, T3FREE, THYROIDAB in the last 72 hours.  Invalid input(s): FREET3 Anemia work up No results for input(s): VITAMINB12, FOLATE, FERRITIN, TIBC, IRON, RETICCTPCT in the last 72 hours. Urinalysis    Component Value Date/Time    COLORURINE YELLOW 09/15/2020 1335   APPEARANCEUR HAZY (A) 09/15/2020  1335   LABSPEC 1.015 09/15/2020 1335   PHURINE 5.0 09/15/2020 1335   GLUCOSEU NEGATIVE 09/15/2020 1335   HGBUR SMALL (A) 09/15/2020 1335   BILIRUBINUR NEGATIVE 09/15/2020 1335   KETONESUR NEGATIVE 09/15/2020 1335   PROTEINUR 100 (A) 09/15/2020 1335   UROBILINOGEN 0.2 12/24/2009 1944   NITRITE POSITIVE (A) 09/15/2020 1335   LEUKOCYTESUR TRACE (A) 09/15/2020 1335   Sepsis Labs Invalid input(s): PROCALCITONIN,  WBC,  LACTICIDVEN Microbiology Recent Results (from the past 240 hour(s))  SARS CORONAVIRUS 2 (TAT 6-24 HRS) Nasopharyngeal Nasopharyngeal Swab     Status: Abnormal   Collection Time: 09/20/20 10:19 AM   Specimen: Nasopharyngeal Swab  Result Value Ref Range Status   SARS Coronavirus 2 POSITIVE (A) NEGATIVE Final    Comment: (NOTE) SARS-CoV-2 target nucleic acids are DETECTED.  The SARS-CoV-2 RNA is generally detectable in upper and lower respiratory specimens during the acute phase of infection. Positive results are indicative of the presence of SARS-CoV-2 RNA. Clinical correlation with patient history and other diagnostic information is  necessary to determine patient infection status. Positive results do not rule out bacterial infection or co-infection with other viruses.  The expected result is Negative.  Fact Sheet for Patients: SugarRoll.be  Fact Sheet for Healthcare Providers: https://www.woods-mathews.com/  This test is not yet approved or cleared by the [place] FDA and  has been authorized for detection and/or diagnosis of SARS-CoV-2 by FDA under an Emergency Use Authorization (EUA). This EUA will remain  in effect (meaning this test can be used) for the duration of the COVID-19 declaration under Section 564(b)(1) of the Act, 21 U. S.C. section 360bbb-3(b)(1), unless the authorization is terminated or revoked sooner.   Performed at [facility], [place] [address], [place], [place] [address]    Time coordinating discharge: 35 minutes  SIGNED:  Kerney Elbe, DO [facility] 09/29/2020, 11:51 AM Pager is on [place]  If 7PM-7AM, please contact night-coverage [email or web address]

## 2020-09-29 NOTE — TOC Progression Note (Addendum)
Transition of Care (TOC) - Progression Note    Patient Details  Name: Todd [name]. MRN: [id number] Date of Birth: [birth year]  Transition of Care [facility]) CM/SW Contact  Angelita Ingles, RN Phone Number: [phone]  09/29/2020, 10:06 AM  Clinical Narrative:    CM at bedside to discuss discharge with patient. Patient states that he is not sure about the facility that has extended bed offer due to rules about leaving facility. Patient states that's not going to work for him.Patient states that he is waiting on independent housing which will take 1-4 months. CM has made patient aware that he is medically ready and should be making a decision about where he will go. Patient states that he wants to wait to see what will happen with a procedure for his knees. CM has made patient aware that when medically cleared he will be discharged. Patient states that he is not emotionally ready for discharge. CM has explained to patient that being emotionally ready has nothing to do with being medically ready. Patient states that he is not going and he will ride it out to see what happens with his knee. MD to be notified.   1100 CM at bedside with patient and MD. Patient is now agreeable to go to [facility]. CM to proceed with discharge.    Expected Discharge Plan: Homeless Shelter Barriers to Discharge: Active Substance Use - Placement,ED Unsafe disposition,Homeless with medical needs  Expected Discharge Plan and Services Expected Discharge Plan: Homeless Shelter In-house Referral: NA Discharge Planning Services: CM Consult   Living arrangements for the past 2 months: Homeless                 DME Arranged: N/A                     Social Determinants of Health (SDOH) Interventions    Readmission Risk Interventions Readmission Risk Prevention Plan 09/16/2020  Transportation Screening Complete  PCP or Specialist Appt within 5-7 Days Complete  Home Care Screening Complete  Medication  Review (RN CM) Referral to Pharmacy  Some recent data might be hidden

## 2020-09-30 ENCOUNTER — Other Ambulatory Visit: Payer: Self-pay

## 2020-09-30 ENCOUNTER — Inpatient Hospital Stay (HOSPITAL_COMMUNITY)
Admission: EM | Admit: 2020-09-30 | Discharge: 2020-10-07 | DRG: 918 | Disposition: A | Discharge status: Home / Self Care | Payer: Medicare HMO | Source: Skilled Nursing Facility | Attending: Family Medicine | Admitting: Family Medicine

## 2020-09-30 ENCOUNTER — Encounter (HOSPITAL_COMMUNITY): Payer: Self-pay | Admitting: Emergency Medicine

## 2020-09-30 ENCOUNTER — Emergency Department (HOSPITAL_COMMUNITY): Payer: Medicare HMO

## 2020-09-30 DIAGNOSIS — F17210 Nicotine dependence, cigarettes, uncomplicated: Secondary | ICD-10-CM | POA: Diagnosis present

## 2020-09-30 DIAGNOSIS — Z20822 Contact with and (suspected) exposure to covid-19: Secondary | ICD-10-CM | POA: Diagnosis present

## 2020-09-30 DIAGNOSIS — N182 Chronic kidney disease, stage 2 (mild): Secondary | ICD-10-CM | POA: Diagnosis present

## 2020-09-30 DIAGNOSIS — J9601 Acute respiratory failure with hypoxia: Secondary | ICD-10-CM

## 2020-09-30 DIAGNOSIS — J690 Pneumonitis due to inhalation of food and vomit: Secondary | ICD-10-CM | POA: Diagnosis not present

## 2020-09-30 DIAGNOSIS — R000 Tachycardia, unspecified: Secondary | ICD-10-CM | POA: Diagnosis not present

## 2020-09-30 DIAGNOSIS — K7010 Alcoholic hepatitis without ascites: Secondary | ICD-10-CM | POA: Diagnosis present

## 2020-09-30 DIAGNOSIS — Z86718 Personal history of other venous thrombosis and embolism: Secondary | ICD-10-CM

## 2020-09-30 DIAGNOSIS — T401X1A Poisoning by heroin, accidental (unintentional), initial encounter: Principal | ICD-10-CM | POA: Diagnosis present

## 2020-09-30 DIAGNOSIS — T50901A Poisoning by unspecified drugs, medicaments and biological substances, accidental (unintentional), initial encounter: Secondary | ICD-10-CM | POA: Diagnosis not present

## 2020-09-30 DIAGNOSIS — R404 Transient alteration of awareness: Secondary | ICD-10-CM | POA: Diagnosis not present

## 2020-09-30 DIAGNOSIS — Z7982 Long term (current) use of aspirin: Secondary | ICD-10-CM

## 2020-09-30 DIAGNOSIS — R0602 Shortness of breath: Secondary | ICD-10-CM | POA: Diagnosis not present

## 2020-09-30 DIAGNOSIS — Z743 Need for continuous supervision: Secondary | ICD-10-CM | POA: Diagnosis not present

## 2020-09-30 DIAGNOSIS — Z9119 Patient's noncompliance with other medical treatment and regimen: Secondary | ICD-10-CM

## 2020-09-30 DIAGNOSIS — U071 COVID-19: Secondary | ICD-10-CM | POA: Diagnosis not present

## 2020-09-30 DIAGNOSIS — Z79899 Other long term (current) drug therapy: Secondary | ICD-10-CM

## 2020-09-30 DIAGNOSIS — R4182 Altered mental status, unspecified: Secondary | ICD-10-CM | POA: Diagnosis not present

## 2020-09-30 DIAGNOSIS — E669 Obesity, unspecified: Secondary | ICD-10-CM | POA: Diagnosis present

## 2020-09-30 DIAGNOSIS — R0902 Hypoxemia: Secondary | ICD-10-CM | POA: Diagnosis not present

## 2020-09-30 DIAGNOSIS — Z86711 Personal history of pulmonary embolism: Secondary | ICD-10-CM

## 2020-09-30 DIAGNOSIS — F32A Depression, unspecified: Secondary | ICD-10-CM | POA: Diagnosis present

## 2020-09-30 DIAGNOSIS — Z8616 Personal history of COVID-19: Secondary | ICD-10-CM

## 2020-09-30 DIAGNOSIS — M109 Gout, unspecified: Secondary | ICD-10-CM | POA: Diagnosis not present

## 2020-09-30 DIAGNOSIS — G9341 Metabolic encephalopathy: Secondary | ICD-10-CM | POA: Diagnosis not present

## 2020-09-30 DIAGNOSIS — N179 Acute kidney failure, unspecified: Secondary | ICD-10-CM | POA: Diagnosis present

## 2020-09-30 DIAGNOSIS — G8929 Other chronic pain: Secondary | ICD-10-CM | POA: Diagnosis present

## 2020-09-30 DIAGNOSIS — Z809 Family history of malignant neoplasm, unspecified: Secondary | ICD-10-CM

## 2020-09-30 DIAGNOSIS — G4730 Sleep apnea, unspecified: Secondary | ICD-10-CM | POA: Diagnosis present

## 2020-09-30 DIAGNOSIS — Z8719 Personal history of other diseases of the digestive system: Secondary | ICD-10-CM

## 2020-09-30 DIAGNOSIS — Z79891 Long term (current) use of opiate analgesic: Secondary | ICD-10-CM

## 2020-09-30 DIAGNOSIS — Z8546 Personal history of malignant neoplasm of prostate: Secondary | ICD-10-CM

## 2020-09-30 DIAGNOSIS — M1909 Primary osteoarthritis, other specified site: Secondary | ICD-10-CM | POA: Diagnosis present

## 2020-09-30 DIAGNOSIS — E869 Volume depletion, unspecified: Secondary | ICD-10-CM | POA: Diagnosis present

## 2020-09-30 DIAGNOSIS — R509 Fever, unspecified: Secondary | ICD-10-CM | POA: Diagnosis not present

## 2020-09-30 DIAGNOSIS — R69 Illness, unspecified: Secondary | ICD-10-CM | POA: Diagnosis not present

## 2020-09-30 DIAGNOSIS — E1122 Type 2 diabetes mellitus with diabetic chronic kidney disease: Secondary | ICD-10-CM | POA: Diagnosis present

## 2020-09-30 DIAGNOSIS — Z6830 Body mass index (BMI) 30.0-30.9, adult: Secondary | ICD-10-CM

## 2020-09-30 DIAGNOSIS — T40601A Poisoning by unspecified narcotics, accidental (unintentional), initial encounter: Secondary | ICD-10-CM | POA: Diagnosis present

## 2020-09-30 DIAGNOSIS — Z9079 Acquired absence of other genital organ(s): Secondary | ICD-10-CM

## 2020-09-30 DIAGNOSIS — M1712 Unilateral primary osteoarthritis, left knee: Secondary | ICD-10-CM | POA: Diagnosis present

## 2020-09-30 LAB — CBC WITH DIFFERENTIAL/PLATELET
Abs Immature Granulocytes: 0.3 10*3/uL — ABNORMAL HIGH (ref 0.00–0.07)
Basophils Absolute: 0.1 10*3/uL (ref 0.0–0.1)
Basophils Relative: 1 %
Eosinophils Absolute: 0 10*3/uL (ref 0.0–0.5)
Eosinophils Relative: 0 %
HCT: 44.4 % (ref 39.0–52.0)
Hemoglobin: 13.2 g/dL (ref 13.0–17.0)
Immature Granulocytes: 2 %
Lymphocytes Relative: 15 %
Lymphs Abs: 2.5 10*3/uL (ref 0.7–4.0)
MCH: 30.7 pg (ref 26.0–34.0)
MCHC: 29.7 g/dL — ABNORMAL LOW (ref 30.0–36.0)
MCV: 103.3 fL — ABNORMAL HIGH (ref 80.0–100.0)
Monocytes Absolute: 1.5 10*3/uL — ABNORMAL HIGH (ref 0.1–1.0)
Monocytes Relative: 9 %
Neutro Abs: 11.7 10*3/uL — ABNORMAL HIGH (ref 1.7–7.7)
Neutrophils Relative %: 73 %
Platelets: 333 10*3/uL (ref 150–400)
RBC: 4.3 MIL/uL (ref 4.22–5.81)
RDW: 15.2 % (ref 11.5–15.5)
WBC: 16.1 10*3/uL — ABNORMAL HIGH (ref 4.0–10.5)
nRBC: 0 % (ref 0.0–0.2)

## 2020-09-30 LAB — COMPREHENSIVE METABOLIC PANEL
ALT: 56 U/L — ABNORMAL HIGH (ref 0–44)
AST: 53 U/L — ABNORMAL HIGH (ref 15–41)
Albumin: 3.4 g/dL — ABNORMAL LOW (ref 3.5–5.0)
Alkaline Phosphatase: 51 U/L (ref 38–126)
Anion gap: 14 (ref 5–15)
BUN: 39 mg/dL — ABNORMAL HIGH (ref 8–23)
CO2: 20 mmol/L — ABNORMAL LOW (ref 22–32)
Calcium: 9.1 mg/dL (ref 8.9–10.3)
Chloride: 109 mmol/L (ref 98–111)
Creatinine, Ser: 2.12 mg/dL — ABNORMAL HIGH (ref 0.61–1.24)
GFR, Estimated: 34 mL/min — ABNORMAL LOW (ref >60)
Glucose, Bld: 110 mg/dL — ABNORMAL HIGH (ref 70–99)
Potassium: 5.3 mmol/L — ABNORMAL HIGH (ref 3.5–5.1)
Sodium: 143 mmol/L (ref 135–145)
Total Bilirubin: 0.3 mg/dL (ref 0.3–1.2)
Total Protein: 7.6 g/dL (ref 6.5–8.1)

## 2020-09-30 MED ORDER — NALOXONE HCL 4 MG/10ML IJ SOLN
0.7500 mg/h | INTRAVENOUS | Status: DC
  Administered 2020-10-01 (×2): 0.75 mg/h via INTRAVENOUS
  Filled 2020-09-30 (×4): qty 10

## 2020-09-30 MED ORDER — NALOXONE HCL 0.4 MG/ML IJ SOLN
0.4000 mg | INTRAMUSCULAR | Status: AC | PRN
  Administered 2020-09-30 (×2): 0.4 mg via INTRAVENOUS
  Filled 2020-09-30 (×2): qty 1

## 2020-09-30 MED ORDER — LACTATED RINGERS IV BOLUS
1000.0000 mL | Freq: Once | INTRAVENOUS | Status: AC
  Administered 2020-09-30: 1000 mL via INTRAVENOUS

## 2020-09-30 MED ORDER — NALOXONE HCL 2 MG/2ML IJ SOSY
1.0000 mg | PREFILLED_SYRINGE | INTRAMUSCULAR | Status: DC | PRN
  Administered 2020-09-30: 1 mg via INTRAVENOUS
  Filled 2020-09-30 (×2): qty 2

## 2020-09-30 NOTE — ED Triage Notes (Addendum)
Pt arrived via EMS from [place], where he is at rehab. Pt overdosed on heroin while in rehab. Pt given 1mg  IV narcan total en route by EMS. Pt had periods of apnea per EMS. Pt is alert at this time. It is unsure of how much heroin pt used. Pt was sent out to the hospital earlier today and discharged home. Pt has hx of hypertension, diabetes, respiratory failure.

## 2020-09-30 NOTE — ED Provider Notes (Signed)
Todd [name]   CSN: [id number] Arrival date & time: 09/30/20  2044     History Chief Complaint  Patient presents with  . Drug Overdose    Heroin overdose    Todd [name]. is a 65 y.o. male.  The history is provided by the patient. No language interpreter was used.  Drug Overdose This is a new problem. The current episode started less than 1 hour ago. The treatment provided moderate relief.  Pt discharged from Hospital yesterday/today . Pt at [facility] today and overdosed on heroin.  Ems has given narcan x 2.     Past Medical History:  Diagnosis Date  . Arthritis    "fingers" (07/14/2018)  . Depression   . DVT (deep venous thrombosis) (HCC) LLE  . Hepatitis C     finished harvoni tx ~ 2017  . Hypercholesterolemia   . Hypertension   . Prostate cancer ([place]) 36yrs ago  . Pulmonary embolism and infarction ([place]) 07/13/2018  . Sleep apnea    not currently using cpap, mask causing vertigo  . Type II diabetes mellitus [facility])     Patient Active Problem List   Diagnosis Date Noted  . Acute respiratory failure with hypoxia ([place]) 09/03/2020  . 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 AMS (altered mental status) 04/03/2019  . Acute kidney failure ([place]) 01/09/2019  . Pulmonary embolus and infarction ([place]) 07/13/2018  . Opioid use disorder, moderate, dependence ([place]) 06/29/2018  . Alcohol dependence ([place]) 06/27/2018  . 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 Varicose veins of left lower extremity with complications 27/01/2375  . Benign paroxysmal positional vertigo 11/08/2013  . RBBB 06/02/2009  . 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 DM (diabetes mellitus), type 2 ([place]) 04/11/2009  . OBESITY 04/11/2009  . DEPRESSION 04/11/2009  . Essential hypertension 04/11/2009  . DIVERTICULAR DISEASE 04/11/2009  . CHOLECYSTITIS 04/11/2009  . CHEST PAIN 04/11/2009  . ABDOMINAL PAIN  04/11/2009    Past Surgical History:  Procedure Laterality Date  . BIOPSY  01/12/2019   Procedure: BIOPSY;  Surgeon: Ronald Lobo, MD;  Location: [facility] ENDOSCOPY;  Service: Endoscopy;;  . BIOPSY  06/23/2019   Procedure: BIOPSY;  Surgeon: Arta Silence, MD;  Location: [place];  Service: Endoscopy;;  . COLONOSCOPY WITH PROPOFOL N/A 02/19/2014   Procedure: COLONOSCOPY WITH PROPOFOL;  Surgeon: Garlan Fair, MD;  Location: [facility] ENDOSCOPY;  Service: Endoscopy;  Laterality: N/A;  . ENDOVENOUS ABLATION SAPHENOUS VEIN W/ LASER Left 11/22/2017   endovenous laser ablation L SSV by Tinnie Gens MD   . ESOPHAGEAL BRUSHING  06/23/2019   Procedure: ESOPHAGEAL BRUSHING;  Surgeon: Arta Silence, MD;  Location: [place];  Service: Endoscopy;;  . ESOPHAGOGASTRODUODENOSCOPY (EGD) WITH PROPOFOL N/A 01/12/2019   Procedure: ESOPHAGOGASTRODUODENOSCOPY (EGD) WITH PROPOFOL;  Surgeon: Ronald Lobo, MD;  Location: [place];  Service: Endoscopy;  Laterality: N/A;  Patient is also scheduled for barium swallow; please notify radiology after patient's EGD is complete so that barium swallow follows the endoscopy, not vice versa  . ESOPHAGOGASTRODUODENOSCOPY (EGD) WITH PROPOFOL N/A 06/23/2019   Procedure: ESOPHAGOGASTRODUODENOSCOPY (EGD) WITH PROPOFOL;  Surgeon: Arta Silence, MD;  Location: [place];  Service: Endoscopy;  Laterality: N/A;  . PROSTATECTOMY  2008  . REPAIR QUADRICEPS / HAMSTRING MUSCLE Right        Family History  Problem Relation Age of Onset  . Cancer Father        PROSTATE    Social History   Tobacco  Use  . Smoking status: Current Every Day Smoker    Packs/day: 0.12    Years: 45.00    Pack years: 5.40    Types: Cigarettes  . Smokeless tobacco: Never Used  Vaping Use  . Vaping Use: Never used  Substance Use Topics  . Alcohol use: Yes  . Drug use: Yes    Home Medications Prior to Admission medications   Medication Sig Start Date End Date Taking? Authorizing  Provider  acetaminophen (TYLENOL) 325 MG tablet Take 2 tablets (650 mg total) by mouth every 8 (eight) hours as needed for mild pain, fever or headache. 09/29/20   Raiford Noble Latif, DO  albuterol (PROVENTIL) (2.5 MG/3ML) 0.083% nebulizer solution Take 3 mLs (2.5 mg total) by nebulization every 2 (two) hours as needed for wheezing. 09/29/20   Raiford Noble Latif, DO  amLODipine (NORVASC) 5 MG tablet Take 1 tablet (5 mg total) by mouth daily. 09/30/20   Raiford Noble Latif, DO  aspirin EC 81 MG tablet Take 1 tablet (81 mg total) by mouth daily. Swallow whole. 09/29/20   Raiford Noble Latif, DO  clotrimazole (LOTRIMIN) 1 % cream Apply topically 2 (two) times daily. 09/29/20   Raiford Noble Latif, DO  colchicine 0.6 MG tablet Take 1 tablet (0.6 mg total) by mouth daily. 09/29/20   Raiford Noble Latif, DO  dextromethorphan-guaiFENesin [facility] DM) 30-600 MG 12hr tablet Take 1 tablet by mouth 2 (two) times daily as needed for cough. 09/29/20   Raiford Noble Latif, DO  docusate sodium (COLACE) 100 MG capsule Take 1 capsule (100 mg total) by mouth 2 (two) times daily. 09/29/20   Raiford Noble Latif, DO  Famotidine (ACID CONTROLLER PO) Take 1 tablet by mouth daily as needed (reflux).    [provider]  folic acid (FOLVITE) 1 MG tablet Take 1 tablet (1 mg total) by mouth daily. 09/30/20   Raiford Noble Latif, DO  metoprolol tartrate (LOPRESSOR) 25 MG tablet Take 1 tablet (25 mg total) by mouth 2 (two) times daily. 09/29/20   Raiford Noble Latif, DO  Multiple Vitamin (MULTIVITAMIN WITH MINERALS) TABS tablet Take 1 tablet by mouth daily. 09/30/20   Raiford Noble Latif, DO  pantoprazole (PROTONIX) 40 MG tablet Take 1 tablet (40 mg total) by mouth at bedtime. 09/29/20   Sheikh, Georgina Quint Latif, DO  polyethylene glycol (MIRALAX / GLYCOLAX) 17 g packet Take 17 g by mouth daily. 09/30/20   Sheikh, Omair Latif, DO  predniSONE (STERAPRED UNI-PAK 21 TAB) 10 MG (21) TBPK tablet Take 6 pills on day 1, 5 pills on day 2, 4 pills  on day 3, 3 pills on day 4, 2 pills on day 5, 1 pill on day 6 and then stop on day 7 09/29/20   Raiford Noble Latif, DO  sertraline (ZOLOFT) 25 MG tablet Take 1 tablet (25 mg total) by mouth daily. 09/29/20   Raiford Noble Latif, DO  sucralfate (CARAFATE) 1 GM/10ML suspension Take 10 mLs (1 g total) by mouth 4 (four) times daily -  with meals and at bedtime. 09/29/20 10/29/20  Raiford Noble Latif, DO  thiamine 100 MG tablet Take 1 tablet (100 mg total) by mouth daily. 09/30/20   Raiford Noble Latif, DO  traMADol (ULTRAM) 50 MG tablet Take 1 tablet (50 mg total) by mouth every 6 (six) hours as needed for moderate pain or severe pain. 09/29/20   Kerney Elbe, DO    Allergies    Patient has no known allergies.  Review of Systems  Review of Systems  Unable to perform ROS: Mental status change    Physical Exam Updated Vital Signs BP (!) 120/96   Pulse (!) 104   Temp (!) 97.5 F (36.4 C) (Oral)   Resp 18   Ht 5\' 6"  (1.676 m)   Wt 78.6 kg   SpO2 96%   BMI 27.97 kg/m   Physical Exam Vitals and nursing [name] reviewed.  Constitutional:      Appearance: Normal appearance. He is well-developed and well-nourished.     Comments: Wakes to verbal    HENT:     Head: Normocephalic.  Eyes:     Extraocular Movements: EOM normal.  Cardiovascular:     Rate and Rhythm: Normal rate.  Pulmonary:     Effort: Pulmonary effort is normal.  Abdominal:     General: Abdomen is flat. There is no distension.  Musculoskeletal:        General: Normal range of motion.     Cervical back: Normal range of motion.  Skin:    General: Skin is warm.  Neurological:     General: No focal deficit present.     Mental Status: He is oriented to person, place, and time.  Psychiatric:        Mood and Affect: Mood and affect and mood normal.     ED Results / Procedures / Treatments   Labs (all labs ordered are listed, but only abnormal results are displayed) Labs Reviewed - No data to  display  EKG None  Radiology No results found.  Procedures Procedures   Medications Ordered in ED Medications - No data to display  ED Course  I have reviewed the triage vital signs and the nursing notes.  Pertinent labs & imaging results that were available during my care of the patient were reviewed by me and considered in my medical decision making (see chart for details).    MDM Rules/Calculators/A&P                          MDM: Pt's care turned over to [place] PA Final Clinical Impression(s) / ED Diagnoses Final diagnoses:  Accidental drug overdose, initial encounter    Rx / DC Orders ED Discharge Orders    None       Sidney Ace 09/30/20 2203    Quintella Reichert, MD 09/30/20 2206

## 2020-09-30 NOTE — ED Provider Notes (Signed)
65 year old male received at sign out from [place] pending re-evaluation:   "Cass Vandermeulen. is a 65 y.o. male.  The history is provided by the patient. No language interpreter was used.  Drug Overdose This is a new problem. The current episode started less than 1 hour ago. The treatment provided moderate relief.  Pt discharged from Hospital yesterday/today . Pt at [facility] today and overdosed on heroin.  Ems has given narcan x 2."  ----------------------------  Patient has a history of alcohol use disorder, diabetes mellitus type 2, opioid use disorder, PE and infarction.   Per chart review, patient was discharged yesterday on corticosteroids for gout.  He had had acute metabolic encephalopathy and a seizure that has since resolved.  He had acute hypoxic respiratory failure due to COVID-19 and aspiration pneumonia and was treated with IV Zosyn and vancomycin. He was later was transitioned to Unasyn.  Level 5 caveat secondary to altered mental status.   Physical Exam  BP (!) 157/89   Pulse 87   Temp (!) 97.5 F (36.4 C) (Oral)   Resp 16   Ht 5\' 6"  (1.676 m)   Wt 78.6 kg   SpO2 96%   BMI 27.97 kg/m   Physical Exam Vitals and nursing note reviewed.  Constitutional:      Appearance: He is well-developed and well-nourished. He is obese. He is not toxic-appearing.     Comments: Nasal cannula in place.  HENT:     Head: Normocephalic and atraumatic.  Eyes:     Comments: Pupils are pinpoint, but reactive.   Cardiovascular:     Rate and Rhythm: Normal rate.     Pulses: Normal pulses.  Pulmonary:     Effort: Pulmonary effort is normal.     Breath sounds: Wheezing present.     Comments: Scattered end expiratory wheezes in the bilateral bases. Abdominal:     General: There is no distension.     Palpations: There is no mass.     Tenderness: There is no abdominal tenderness. There is no right CVA tenderness, left CVA tenderness, guarding or rebound.     Hernia: No  hernia is present.  Musculoskeletal:     Cervical back: Neck supple.  Neurological:     Comments: Somnolent, but arouses to loud voice.  Psychiatric:        Mood and Affect: Mood and affect normal.        Behavior: Behavior normal.     ED Course/Procedures   Clinical Course as of 10/01/20 0100  Tue Sep 30, 2020  [id number] Notified by RN that patient has received 2 doses of as needed Narcan by previous clinician.  Second dose of Narcan was administered approximately 20 minutes ago after she noticed that the patient's respiration rate had significantly decreased and on reevaluation he had snoring respirations.  However, he continues to be somnolent and hypoxic with an oxygen requirement.  On my evaluation, patient is drowsy and hypoxic. He continues to remove [place]. Will give narcan bolus and infusion and admit.  [MM]    Clinical Course User Index [MM] Shimshon Narula A, PA-C    .Critical Care Performed by: Joanne Gavel, PA-C Authorized by: Joanne Gavel, PA-C   Critical care provider statement:    Critical care time (minutes):  35   Critical care time was exclusive of:  Separately billable procedures and treating other patients and teaching time   Critical care was necessary to treat or prevent imminent or life-threatening  deterioration of the following conditions:  Respiratory failure and CNS failure or compromise   Critical care was time spent personally by me on the following activities:  Ordering and performing treatments and interventions, ordering and review of laboratory studies, ordering and review of radiographic studies, pulse oximetry, re-evaluation of patient's condition, examination of patient, evaluation of patient's response to treatment, development of treatment plan with patient or surrogate, discussions with consultants, obtaining history from patient or surrogate and review of old charts   I assumed direction of critical care for this patient from another provider in my  specialty: yes     Care discussed with: admitting provider     Care discussed with comment:  Critical care team for admission    MDM   65 year old male received at sign out from [place] pending re-evaluation. Please see her note for further work up and medical decision making.   Mild tachycardia on arrival, but this has since resolved.  He is mildly hypertensive.  Afebrile.  He is requiring supplemental oxygen via nasal cannula after being brought in on a nonrebreather for agonal breathing.  He was given 1 mg of Narcan in route with EMS.  Prior to assuming care of the patient, he was also given 0.4 mg of Narcan I V.  However, despite these 2 doses, the patient has become increasingly somnolent and continues to remain hypoxic despite having been observed in the ER for at least 2 to 3 hours at this point.  Patient was given 1 mg of Narcan and started on a 0.75 mg infusion.  Given continued hypoxia, will also check chest x-ray since he recently had acute respiratory failure secondary to aspiration pneumonia and COVID-19.  We will add on ethanol level given his history of alcohol use disorder and recheck metabolic panel given significant changes from labs 24 hours ago.  Labs and imaging have been reviewed and independently interpreted by me.  He has a new AKI. Initial Cr level today 2.1 (1.15 yesterday). Repeat today is 2.44.  He has been given a 1 L bolus of LR.  He does have some mild hyperkalemia (initially 5.3 today, now 5.7).  We will add on EKG to assess for changes.  However, hyperkalemia will also be treated by IV fluids.  Leukocytosis of 16 today, but this is likely secondary to prednisone course that the patient was discharged with from the hospital.  Ethanol level is less than 10.  Will order CIWA score.  UDS positive for opioids.  Chest x-ray with residual mild right basilar atelectasis/infiltrate, but improved from previous.  I spoke with Dr. Earlie Server, critical care, who will accept  the patient for admission..  Patient was discussed with Dr. Florina Ou, attending physician.  The patient appears reasonably stabilized for admission considering the current resources, flow, and capabilities available in the ED at this time, and I doubt any other [facility] requiring further screening and/or treatment in the ED prior to admission.         Joline Maxcy A, PA-C 10/01/20 0321    Shanon Rosser, MD 10/01/20 [phone]

## 2020-10-01 DIAGNOSIS — K7010 Alcoholic hepatitis without ascites: Secondary | ICD-10-CM | POA: Diagnosis present

## 2020-10-01 DIAGNOSIS — Z8616 Personal history of COVID-19: Secondary | ICD-10-CM | POA: Diagnosis not present

## 2020-10-01 DIAGNOSIS — T401X1D Poisoning by heroin, accidental (unintentional), subsequent encounter: Secondary | ICD-10-CM | POA: Diagnosis not present

## 2020-10-01 DIAGNOSIS — Z8546 Personal history of malignant neoplasm of prostate: Secondary | ICD-10-CM | POA: Diagnosis not present

## 2020-10-01 DIAGNOSIS — F17210 Nicotine dependence, cigarettes, uncomplicated: Secondary | ICD-10-CM | POA: Diagnosis not present

## 2020-10-01 DIAGNOSIS — T401X1A Poisoning by heroin, accidental (unintentional), initial encounter: Secondary | ICD-10-CM | POA: Diagnosis present

## 2020-10-01 DIAGNOSIS — M1909 Primary osteoarthritis, other specified site: Secondary | ICD-10-CM | POA: Diagnosis not present

## 2020-10-01 DIAGNOSIS — N182 Chronic kidney disease, stage 2 (mild): Secondary | ICD-10-CM | POA: Diagnosis not present

## 2020-10-01 DIAGNOSIS — G8929 Other chronic pain: Secondary | ICD-10-CM | POA: Diagnosis present

## 2020-10-01 DIAGNOSIS — Z79899 Other long term (current) drug therapy: Secondary | ICD-10-CM | POA: Diagnosis not present

## 2020-10-01 DIAGNOSIS — E869 Volume depletion, unspecified: Secondary | ICD-10-CM | POA: Diagnosis present

## 2020-10-01 DIAGNOSIS — E669 Obesity, unspecified: Secondary | ICD-10-CM | POA: Diagnosis present

## 2020-10-01 DIAGNOSIS — Z9079 Acquired absence of other genital organ(s): Secondary | ICD-10-CM | POA: Diagnosis not present

## 2020-10-01 DIAGNOSIS — Z7982 Long term (current) use of aspirin: Secondary | ICD-10-CM | POA: Diagnosis not present

## 2020-10-01 DIAGNOSIS — Z86711 Personal history of pulmonary embolism: Secondary | ICD-10-CM | POA: Diagnosis not present

## 2020-10-01 DIAGNOSIS — T50901A Poisoning by unspecified drugs, medicaments and biological substances, accidental (unintentional), initial encounter: Secondary | ICD-10-CM | POA: Diagnosis not present

## 2020-10-01 DIAGNOSIS — R0602 Shortness of breath: Secondary | ICD-10-CM | POA: Diagnosis not present

## 2020-10-01 DIAGNOSIS — Z79891 Long term (current) use of opiate analgesic: Secondary | ICD-10-CM | POA: Diagnosis not present

## 2020-10-01 DIAGNOSIS — M1712 Unilateral primary osteoarthritis, left knee: Secondary | ICD-10-CM | POA: Diagnosis not present

## 2020-10-01 DIAGNOSIS — N179 Acute kidney failure, unspecified: Secondary | ICD-10-CM | POA: Diagnosis not present

## 2020-10-01 DIAGNOSIS — R69 Illness, unspecified: Secondary | ICD-10-CM | POA: Diagnosis not present

## 2020-10-01 DIAGNOSIS — E1122 Type 2 diabetes mellitus with diabetic chronic kidney disease: Secondary | ICD-10-CM | POA: Diagnosis not present

## 2020-10-01 DIAGNOSIS — Z9119 Patient's noncompliance with other medical treatment and regimen: Secondary | ICD-10-CM | POA: Diagnosis not present

## 2020-10-01 DIAGNOSIS — T40601A Poisoning by unspecified narcotics, accidental (unintentional), initial encounter: Secondary | ICD-10-CM | POA: Diagnosis present

## 2020-10-01 DIAGNOSIS — Z20822 Contact with and (suspected) exposure to covid-19: Secondary | ICD-10-CM | POA: Diagnosis not present

## 2020-10-01 DIAGNOSIS — Z86718 Personal history of other venous thrombosis and embolism: Secondary | ICD-10-CM | POA: Diagnosis not present

## 2020-10-01 DIAGNOSIS — F32A Depression, unspecified: Secondary | ICD-10-CM | POA: Diagnosis present

## 2020-10-01 DIAGNOSIS — Z809 Family history of malignant neoplasm, unspecified: Secondary | ICD-10-CM | POA: Diagnosis not present

## 2020-10-01 DIAGNOSIS — G4730 Sleep apnea, unspecified: Secondary | ICD-10-CM | POA: Diagnosis present

## 2020-10-01 LAB — GLUCOSE, CAPILLARY
Glucose-Capillary: 84 mg/dL (ref 70–99)
Glucose-Capillary: 98 mg/dL (ref 70–99)
Glucose-Capillary: 106 mg/dL — ABNORMAL HIGH (ref 70–99)

## 2020-10-01 LAB — RAPID URINE DRUG SCREEN, HOSP PERFORMED
Amphetamines: NOT DETECTED
Barbiturates: NOT DETECTED
Benzodiazepines: NOT DETECTED
Cocaine: NOT DETECTED
Opiates: POSITIVE — AB
Tetrahydrocannabinol: NOT DETECTED

## 2020-10-01 LAB — BASIC METABOLIC PANEL
Anion gap: 13 (ref 5–15)
Anion gap: 10 (ref 5–15)
BUN: 42 mg/dL — ABNORMAL HIGH (ref 8–23)
BUN: 34 mg/dL — ABNORMAL HIGH (ref 8–23)
CO2: 21 mmol/L — ABNORMAL LOW (ref 22–32)
CO2: 23 mmol/L (ref 22–32)
Calcium: 9 mg/dL (ref 8.9–10.3)
Calcium: 8.7 mg/dL — ABNORMAL LOW (ref 8.9–10.3)
Chloride: 108 mmol/L (ref 98–111)
Chloride: 104 mmol/L (ref 98–111)
Creatinine, Ser: 2.09 mg/dL — ABNORMAL HIGH (ref 0.61–1.24)
Creatinine, Ser: 1.52 mg/dL — ABNORMAL HIGH (ref 0.61–1.24)
GFR, Estimated: 35 mL/min — ABNORMAL LOW (ref >60)
GFR, Estimated: 51 mL/min — ABNORMAL LOW (ref >60)
Glucose, Bld: 118 mg/dL — ABNORMAL HIGH (ref 70–99)
Glucose, Bld: 114 mg/dL — ABNORMAL HIGH (ref 70–99)
Potassium: 5.2 mmol/L — ABNORMAL HIGH (ref 3.5–5.1)
Potassium: 4.1 mmol/L (ref 3.5–5.1)
Sodium: 142 mmol/L (ref 135–145)
Sodium: 137 mmol/L (ref 135–145)

## 2020-10-01 LAB — COMPREHENSIVE METABOLIC PANEL
ALT: 58 U/L — ABNORMAL HIGH (ref 0–44)
AST: 55 U/L — ABNORMAL HIGH (ref 15–41)
Albumin: 3.6 g/dL (ref 3.5–5.0)
Alkaline Phosphatase: 53 U/L (ref 38–126)
Anion gap: 15 (ref 5–15)
BUN: 43 mg/dL — ABNORMAL HIGH (ref 8–23)
CO2: 22 mmol/L (ref 22–32)
Calcium: 9.3 mg/dL (ref 8.9–10.3)
Chloride: 106 mmol/L (ref 98–111)
Creatinine, Ser: 2.44 mg/dL — ABNORMAL HIGH (ref 0.61–1.24)
GFR, Estimated: 29 mL/min — ABNORMAL LOW (ref >60)
Glucose, Bld: 110 mg/dL — ABNORMAL HIGH (ref 70–99)
Potassium: 5.7 mmol/L — ABNORMAL HIGH (ref 3.5–5.1)
Sodium: 143 mmol/L (ref 135–145)
Total Bilirubin: 0.5 mg/dL (ref 0.3–1.2)
Total Protein: 8.1 g/dL (ref 6.5–8.1)

## 2020-10-01 LAB — ETHANOL: Alcohol, Ethyl (B): <10 mg/dL (ref <10)

## 2020-10-01 LAB — CBC
HCT: 39.5 % (ref 39.0–52.0)
Hemoglobin: 12 g/dL — ABNORMAL LOW (ref 13.0–17.0)
MCH: 31.1 pg (ref 26.0–34.0)
MCHC: 30.4 g/dL (ref 30.0–36.0)
MCV: 102.3 fL — ABNORMAL HIGH (ref 80.0–100.0)
Platelets: 308 10*3/uL (ref 150–400)
RBC: 3.86 MIL/uL — ABNORMAL LOW (ref 4.22–5.81)
RDW: 15.5 % (ref 11.5–15.5)
WBC: 14.1 10*3/uL — ABNORMAL HIGH (ref 4.0–10.5)
nRBC: 0 % (ref 0.0–0.2)

## 2020-10-01 LAB — MAGNESIUM: Magnesium: 2 mg/dL (ref 1.7–2.4)

## 2020-10-01 LAB — SARS CORONAVIRUS 2 (TAT 6-24 HRS): SARS Coronavirus 2: NEGATIVE

## 2020-10-01 LAB — MRSA PCR SCREENING: MRSA by PCR: NEGATIVE

## 2020-10-01 LAB — PHOSPHORUS: Phosphorus: 7.3 mg/dL — ABNORMAL HIGH (ref 2.5–4.6)

## 2020-10-01 MED ORDER — THIAMINE HCL 100 MG PO TABS
100.0000 mg | ORAL_TABLET | Freq: Every day | ORAL | Status: DC
  Administered 2020-10-01 – 2020-10-07 (×7): 100 mg via ORAL
  Filled 2020-10-01 (×7): qty 1

## 2020-10-01 MED ORDER — METOPROLOL TARTRATE 25 MG PO TABS
25.0000 mg | ORAL_TABLET | Freq: Two times a day (BID) | ORAL | Status: DC
  Administered 2020-10-01 – 2020-10-07 (×14): 25 mg via ORAL
  Filled 2020-10-01 (×14): qty 1

## 2020-10-01 MED ORDER — ENOXAPARIN SODIUM 40 MG/0.4ML ~~LOC~~ SOLN
40.0000 mg | SUBCUTANEOUS | Status: DC
  Administered 2020-10-01 – 2020-10-03 (×3): 40 mg via SUBCUTANEOUS
  Filled 2020-10-01 (×3): qty 0.4

## 2020-10-01 MED ORDER — ALBUTEROL SULFATE (2.5 MG/3ML) 0.083% IN NEBU: 2.5000 mg | INHALATION_SOLUTION | RESPIRATORY_TRACT | Status: DC | PRN

## 2020-10-01 MED ORDER — ONDANSETRON HCL 4 MG/2ML IJ SOLN: 4.0000 mg | Freq: Four times a day (QID) | INTRAMUSCULAR | Status: DC | PRN

## 2020-10-01 MED ORDER — LACTATED RINGERS IV SOLN: INTRAVENOUS | Status: DC

## 2020-10-01 MED ORDER — PANTOPRAZOLE SODIUM 40 MG PO TBEC
40.0000 mg | DELAYED_RELEASE_TABLET | Freq: Every day | ORAL | Status: DC
  Administered 2020-10-01 – 2020-10-07 (×7): 40 mg via ORAL
  Filled 2020-10-01 (×7): qty 1

## 2020-10-01 MED ORDER — ASPIRIN EC 81 MG PO TBEC
81.0000 mg | DELAYED_RELEASE_TABLET | Freq: Every day | ORAL | Status: DC
  Administered 2020-10-01 – 2020-10-07 (×7): 81 mg via ORAL
  Filled 2020-10-01 (×7): qty 1

## 2020-10-01 MED ORDER — ORAL CARE MOUTH RINSE
15.0000 mL | Freq: Two times a day (BID) | OROMUCOSAL | Status: DC
  Administered 2020-10-01 – 2020-10-05 (×7): 15 mL via OROMUCOSAL

## 2020-10-01 MED ORDER — SERTRALINE HCL 25 MG PO TABS
25.0000 mg | ORAL_TABLET | Freq: Every day | ORAL | Status: DC
  Administered 2020-10-01 – 2020-10-07 (×7): 25 mg via ORAL
  Filled 2020-10-01 (×7): qty 1

## 2020-10-01 MED ORDER — AMLODIPINE BESYLATE 5 MG PO TABS
5.0000 mg | ORAL_TABLET | Freq: Every day | ORAL | Status: DC
  Administered 2020-10-01 – 2020-10-07 (×7): 5 mg via ORAL
  Filled 2020-10-01 (×7): qty 1

## 2020-10-01 MED ORDER — DOCUSATE SODIUM 100 MG PO CAPS: 100.0000 mg | ORAL_CAPSULE | Freq: Two times a day (BID) | ORAL | Status: DC | PRN

## 2020-10-01 MED ORDER — CHLORHEXIDINE GLUCONATE CLOTH 2 % EX PADS
6.0000 | MEDICATED_PAD | Freq: Every day | CUTANEOUS | Status: DC
  Administered 2020-10-01 – 2020-10-06 (×4): 6 via TOPICAL

## 2020-10-01 MED ORDER — HALOPERIDOL LACTATE 5 MG/ML IJ SOLN
2.0000 mg | Freq: Four times a day (QID) | INTRAMUSCULAR | Status: DC | PRN
  Administered 2020-10-01: 2 mg via INTRAVENOUS
  Filled 2020-10-01: qty 1

## 2020-10-01 MED ORDER — INSULIN ASPART 100 UNIT/ML ~~LOC~~ SOLN: 0.0000 [IU] | Freq: Every day | SUBCUTANEOUS | Status: DC

## 2020-10-01 MED ORDER — ACETAMINOPHEN 325 MG PO TABS
650.0000 mg | ORAL_TABLET | Freq: Four times a day (QID) | ORAL | Status: DC | PRN
  Administered 2020-10-05 – 2020-10-07 (×4): 650 mg via ORAL
  Filled 2020-10-01 (×4): qty 2

## 2020-10-01 MED ORDER — INSULIN ASPART 100 UNIT/ML ~~LOC~~ SOLN
0.0000 [IU] | Freq: Three times a day (TID) | SUBCUTANEOUS | Status: DC
  Administered 2020-10-02 – 2020-10-05 (×5): 1 [IU] via SUBCUTANEOUS
  Administered 2020-10-05 – 2020-10-06 (×2): 2 [IU] via SUBCUTANEOUS

## 2020-10-01 MED ORDER — POLYETHYLENE GLYCOL 3350 17 G PO PACK: 17.0000 g | PACK | Freq: Every day | ORAL | Status: DC | PRN

## 2020-10-01 MED ORDER — COLCHICINE 0.6 MG PO TABS
0.6000 mg | ORAL_TABLET | Freq: Every day | ORAL | Status: DC
  Administered 2020-10-01 – 2020-10-07 (×7): 0.6 mg via ORAL
  Filled 2020-10-01 (×7): qty 1

## 2020-10-01 MED ORDER — HYDROXYZINE HCL 25 MG PO TABS
25.0000 mg | ORAL_TABLET | Freq: Three times a day (TID) | ORAL | Status: DC | PRN
  Administered 2020-10-01: 25 mg via ORAL
  Filled 2020-10-01: qty 1

## 2020-10-01 NOTE — TOC Initial Note (Signed)
Transition of Care (TOC) - Initial/Assessment Note    Patient Details  Name: Todd [name]. MRN: [id number] Date of Birth: 1955-09-15  Transition of Care [facility]) CM/SW Contact:    Leeroy Cha, RN Phone Number: 10/01/2020, 8:12 AM  Clinical Narrative:                 65 year old black male that presented to the emergency room from the SNF.  The patient had overdosed on heroin while at the SNF.  He states he did approximately $40 worth of heroin by snorting it.  Patient had been discharged on 09/29/2020 from the hospital and sent to the SNF.  During that hospital stay he was originally admitted and intubated for encephalopathy, Covid pneumonia, new onset seizures, gout.  Patient states that he went to the SNF was not have a new symptoms he took the heroin at approximately 1600 hrs. today.  Patient was given Narcan by EMS as well as in the emergency room.  It did not last very long before he became somnolent again and ultimately required a Narcan infusion.  Currently he is awake and appropriate.  Following commands.  He denies EtOH abuse in the past 24 hours.  He has been using heroin consistently for the past 3 years he denies any new physical symptoms.  No chest pain/chest pressure/shortness of breath/headache/dizziness/nausea/vomiting/diarrhea/fever/chills PLAN TO RETURN TO [facility] Expected Discharge Plan: [place] Barriers to Discharge: Continued Medical Work up   Patient Goals and CMS Choice Patient states their goals for this hospitalization and ongoing recovery are:: NEED TO GO TO [place] CMS Medicare.gov Compare Post Acute Care list provided to:: Patient    Expected Discharge Plan and Services Expected Discharge Plan: [place]   Discharge Planning Services: CM Consult Post Acute Care Choice: [place] Living arrangements for the past 2 months: [place] Expected Discharge Date:   (unknown)                                    Prior Living Arrangements/Services Living arrangements for the past 2 months: [place] Lives with:: Facility Resident Patient language and need for interpreter reviewed:: Yes Do you feel safe going back to the place where you live?: Yes      Need for Family Participation in Patient Care: Yes (Comment) Care giver support system in place?: Yes (comment)   Criminal Activity/Legal Involvement Pertinent to Current Situation/Hospitalization: No - Comment as needed  Activities of Daily Living Home Assistive Devices/Equipment: [place] (specify type) ADL Screening (condition at time of admission) Patient's cognitive ability adequate to safely complete daily activities?: Yes Is the patient deaf or have difficulty hearing?: No Does the patient have difficulty seeing, even when wearing glasses/contacts?: No Does the patient have difficulty concentrating, remembering, or making decisions?: No Patient able to express need for assistance with ADLs?: Yes Does the patient have difficulty dressing or bathing?: Yes Independently performs ADLs?: No Communication: Independent Dressing (OT): Needs assistance Is this a change from baseline?: Pre-admission baseline Grooming: Independent Feeding: Independent Bathing: Needs assistance Is this a change from baseline?: Pre-admission baseline Toileting: Needs assistance Is this a change from baseline?: Pre-admission baseline In/Out Bed: Needs assistance Is this a change from baseline?: Pre-admission baseline Walks in Home: Needs assistance Is this a change from baseline?: Pre-admission baseline Does the patient have difficulty walking or climbing stairs?: Yes (secondary  to weakness) Weakness of Legs: Both Weakness of Arms/Hands: None  Permission Sought/Granted                  Emotional Assessment Appearance:: Appears stated age Attitude/Demeanor/Rapport: Engaged Affect  (typically observed): Constricted Orientation: : Oriented to Self,Oriented to Place,Oriented to  Time,Oriented to Situation Alcohol / Substance Use: Illicit Drugs Psych Involvement: No (comment)  Admission diagnosis:  Heroin overdose ([place]) [T40.1X1A] Acute respiratory failure with hypoxia ([place]) [J96.01] AKI (acute kidney injury) ([place]) [N17.9] Accidental overdose of heroin, initial encounter [facility]) [T40.1X1A] Patient Active Problem List   Diagnosis Date Noted  . Heroin overdose ([place]) 10/01/2020  . Acute respiratory failure with hypoxia ([place]) 09/03/2020  . 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 AMS (altered mental status) 04/03/2019  . Acute kidney failure ([place]) 01/09/2019  . Pulmonary embolus and infarction ([place]) 07/13/2018  . Opioid use disorder, moderate, dependence ([place]) 06/29/2018  . Alcohol dependence ([place]) 06/27/2018  . MDD (major depressive disorder), recurrent severe, without psychosis ([place]) 06/26/2018  . Varicose veins of left lower extremity with complications 37/11/8887  . Benign paroxysmal positional vertigo 11/08/2013  . RBBB 06/02/2009  . 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 DM (diabetes mellitus), type 2 ([place]) 04/11/2009  . OBESITY 04/11/2009  . DEPRESSION 04/11/2009  . Essential hypertension 04/11/2009  . DIVERTICULAR DISEASE 04/11/2009  . CHOLECYSTITIS 04/11/2009  . CHEST PAIN 04/11/2009  . ABDOMINAL PAIN 04/11/2009   PCP:  [name], MD Pharmacy:   [place]), [place] - [address] [place]) [place] [address] Phone: [phone] Fax: [place], [place] - [address] [place] [address] Phone: [phone] Fax: [phone]     Social Determinants of Health (SDOH) Interventions    Readmission Risk Interventions Readmission Risk Prevention Plan 09/16/2020  Transportation Screening Complete  PCP or Specialist  Appt within 5-7 Days Complete  Home Care Screening Complete  Medication Review (RN CM) Referral to Pharmacy  Some recent data might be hidden

## 2020-10-01 NOTE — Progress Notes (Signed)
Pt is alert and oriented x4 when asked orientation questions. Pt responds appropriately to nurse in the room. When staff is out of the room, pt is groaning and seeming to talk to himself or others when no one is there and he is not on the phone. He is fidgety and pulled out a PIV unintentionally. His tele leads are frequently removed unintentionally. RN will continue to monitor.

## 2020-10-01 NOTE — H&P (Signed)
NAME:  Todd [name]., MRN:  [id number], DOB:  [birth year], LOS: 0 ADMISSION DATE:  09/30/2020,   Brief History:  65 year old male that overdosed on heroin  History of Present Illness:  This is a 65 year old black male that presented to the emergency room from the SNF.  The patient had overdosed on heroin while at the SNF.  He states he did approximately $40 worth of heroin by snorting it.  Patient had been discharged on 09/29/2020 from the hospital and sent to the SNF.  During that hospital stay he was originally admitted and intubated for encephalopathy, Covid pneumonia, new onset seizures, gout.  Patient states that he went to the SNF was not have a new symptoms he took the heroin at approximately 1600 hrs. today.  Patient was given Narcan by EMS as well as in the emergency room.  It did not last very long before he became somnolent again and ultimately required a Narcan infusion.  Currently he is awake and appropriate.  Following commands.  He denies EtOH abuse in the past 24 hours.  He has been using heroin consistently for the past 3 years he denies any new physical symptoms.  No chest pain/chest pressure/shortness of breath/headache/dizziness/nausea/vomiting/diarrhea/fever/chills  .  Past Medical History:  Encephalopathy CKD 3 Transaminitis Hepatitis C EtOH abuse Microcytic anemia Ulcerative esophagitis Depression DVT/PE Hypertension   Significant Diagnostic Tests:  Chest x-ray slowly improving  Micro Data:  Covid pending  Antimicrobials:  None    Objective   Blood pressure (!) 157/89, pulse 87, temperature (!) 97.5 F (36.4 C), temperature source Oral, resp. rate 16, height 5\' 6"  (1.676 m), weight 78.6 kg, SpO2 96 %.       No intake or output data in the 24 hours ending 10/01/20 0100 Filed Weights   09/30/20 2058  Weight: 78.6 kg    Examination: General: No acute distress HENT: Atraumatic/normocephalic mucous membranes are dry Lungs: Clear to auscultation  no wheezing rales or rhonchi noted Cardiovascular: Regular rate 1/6 systolic ejection murmur Abdomen: Soft, nontender, nondistended, no rebound/rigidity/guarding Extremities: Positive clubbing no cyanosis no edema Neuro: Awake and alert.  Motors 5 out of 5 x 4 without focal neurologic deficit.   Assessment & Plan:  Acute heroin overdose-accidental Acute on chronic kidney disease Intravascular volume depletion Recent Covid pneumonia Hepatitis C and  EtOH hepatitis    Plan: Admit to the intensive care unit for further work-up. Patient's been started on Narcan infusion and neurologically is back to baseline. Lactated Ringer's 100 mL/h Monitor I's/O's Resume p.o. diet Avoid all sedative type medications Continue outpatient medications for hypertension Patient was diagnosed with Covid on 09/04/2020.  His 20-day quarantine ended on 09/24/2020.  Best practice (evaluated daily)  Diet: Full Pain/Anxiety/Delirium protocol (if indicated): None VAP protocol (if indicated): Does not apply DVT prophylaxis: Lovenox GI prophylaxis: Protonix Glucose control: Monitor blood sugar Mobility: Bedrest until back to neurologic baseline Disposition: Admit to ICU  Goals of Care:   Code Status: Full code  Labs   CBC: Recent Labs  Lab 09/26/20 0305 09/27/20 0359 09/28/20 0338 09/29/20 0106 09/30/20 2120  WBC 11.5* 13.0* 11.6* 13.8* 16.1*  NEUTROABS 7.7 8.5* 6.7 8.5* 11.7*  HGB 11.9* 11.8* 12.0* 11.9* 13.2  HCT 36.5* 36.5* 38.5* 37.5* 44.4  MCV 95.3 96.3 97.2 95.4 103.3*  PLT 351 348 348 346 244    Basic Metabolic Panel: Recent Labs  Lab 09/25/20 0233 09/26/20 0305 09/27/20 0359 09/28/20 0338 09/29/20 0106 09/30/20 0000 09/30/20 2120  NA 141 139  140 141 140 143 143  K 4.5 5.0 4.4 4.3 4.7 5.7* 5.3*  CL 107 107 105 105 105 106 109  CO2 24 23 24 26 24 22  20*  GLUCOSE 106* 166* 123* 107* 117* 110* 110*  BUN 13 17 20 17  25* 43* 39*  CREATININE 1.21 1.19 1.17 1.14 1.15 2.44*  2.12*  CALCIUM 9.3 9.6 9.4 9.4 9.3 9.3 9.1  MG 1.6* 1.9 1.6* 1.8 2.1  --   --   PHOS 3.5 2.1* 3.2 4.2 4.6  --   --    GFR: Estimated Creatinine Clearance: 34.7 mL/min (A) (by C-G formula based on SCr of 2.12 mg/dL (H)). Recent Labs  Lab 09/27/20 0359 09/28/20 0338 09/29/20 0106 09/30/20 2120  WBC 13.0* 11.6* 13.8* 16.1*    Liver Function Tests: Recent Labs  Lab 09/27/20 0359 09/28/20 0338 09/29/20 0106 09/30/20 0000 09/30/20 2120  AST 32 39 29 55* 53*  ALT 33 37 34 58* 56*  ALKPHOS 47 49 48 53 51  BILITOT 0.4 0.6 0.6 0.5 0.3  PROT 6.8 6.8 6.7 8.1 7.6  ALBUMIN 2.6* 2.6* 2.6* 3.6 3.4*   No results for input(s): LIPASE, AMYLASE in the last 168 hours. No results for input(s): AMMONIA in the last 168 hours.  ABG    Component Value Date/Time   PHART 7.133 (LL) 09/03/2020 1803   PCO2ART 54.3 (H) 09/03/2020 1803   PO2ART 108 09/03/2020 1803   HCO3 17.9 (L) 09/03/2020 1803   TCO2 19 (L) 09/03/2020 1803   ACIDBASEDEF 11.0 (H) 09/03/2020 1803   O2SAT 95.0 09/03/2020 1803     Coagulation Profile: No results for input(s): INR, PROTIME in the last 168 hours.  Cardiac Enzymes: No results for input(s): CKTOTAL, CKMB, CKMBINDEX, TROPONINI in the last 168 hours.  HbA1C: Hgb A1c MFr Bld  Date/Time Value Ref Range Status  09/03/2020 08:09 PM 5.5 4.8 - 5.6 % Final    Comment:    (NOTE) Pre diabetes:          5.7%-6.4%  Diabetes:              >6.4%  Glycemic control for   <7.0% adults with diabetes   01/10/2019 05:08 PM 6.4 (H) 4.8 - 5.6 % Final    Comment:    (NOTE) Pre diabetes:          5.7%-6.4% Diabetes:              >6.4% Glycemic control for   <7.0% adults with diabetes     CBG: Recent Labs  Lab 09/28/20 1549 09/28/20 1944 09/29/20 0745 09/29/20 1225 09/29/20 1631  GLUCAP 154* 166* 85 182* 217*    Review of Systems:   See HPI  Past Medical History:  He,  has a past medical history of Arthritis, Depression, DVT (deep venous thrombosis) (HCC)  (LLE), Hepatitis C, Hypercholesterolemia, Hypertension, Prostate cancer ([place]) (55yrs ago), Pulmonary embolism and infarction ([place]) (07/13/2018), Sleep apnea, and Type II diabetes mellitus ([place]).   Surgical History:   Past Surgical History:  Procedure Laterality Date  . BIOPSY  01/12/2019   Procedure: BIOPSY;  Surgeon: Ronald Lobo, MD;  Location: [facility] ENDOSCOPY;  Service: Endoscopy;;  . BIOPSY  06/23/2019   Procedure: BIOPSY;  Surgeon: Arta Silence, MD;  Location: [facility];  Service: Endoscopy;;  . COLONOSCOPY WITH PROPOFOL N/A 02/19/2014   Procedure: COLONOSCOPY WITH PROPOFOL;  Surgeon: Garlan Fair, MD;  Location: [facility] ENDOSCOPY;  Service: Endoscopy;  Laterality: N/A;  . ENDOVENOUS ABLATION SAPHENOUS VEIN  W/ LASER Left 11/22/2017   endovenous laser ablation L SSV by Tinnie Gens MD   . ESOPHAGEAL BRUSHING  06/23/2019   Procedure: ESOPHAGEAL BRUSHING;  Surgeon: Arta Silence, MD;  Location: [facility];  Service: Endoscopy;;  . ESOPHAGOGASTRODUODENOSCOPY (EGD) WITH PROPOFOL N/A 01/12/2019   Procedure: ESOPHAGOGASTRODUODENOSCOPY (EGD) WITH PROPOFOL;  Surgeon: Ronald Lobo, MD;  Location: [facility];  Service: Endoscopy;  Laterality: N/A;  Patient is also scheduled for barium swallow; please notify radiology after patient's EGD is complete so that barium swallow follows the endoscopy, not vice versa  . ESOPHAGOGASTRODUODENOSCOPY (EGD) WITH PROPOFOL N/A 06/23/2019   Procedure: ESOPHAGOGASTRODUODENOSCOPY (EGD) WITH PROPOFOL;  Surgeon: Arta Silence, MD;  Location: [facility];  Service: Endoscopy;  Laterality: N/A;  . PROSTATECTOMY  2008  . REPAIR QUADRICEPS / HAMSTRING MUSCLE Right      Social History:   reports that he has been smoking cigarettes. He has a 5.40 pack-year smoking history. He has never used smokeless tobacco. He reports current alcohol use. He reports current drug use.   Family History:  His family history includes Cancer in his father.   Allergies No Known  Allergies   Home Medications  Prior to Admission medications   Medication Sig Start Date End Date Taking? Authorizing Provider  acetaminophen (TYLENOL) 325 MG tablet Take 2 tablets (650 mg total) by mouth every 8 (eight) hours as needed for mild pain, fever or headache. 09/29/20   Raiford Noble Latif, DO  albuterol (PROVENTIL) (2.5 MG/3ML) 0.083% nebulizer solution Take 3 mLs (2.5 mg total) by nebulization every 2 (two) hours as needed for wheezing. 09/29/20   Raiford Noble Latif, DO  amLODipine (NORVASC) 5 MG tablet Take 1 tablet (5 mg total) by mouth daily. 09/30/20   Raiford Noble Latif, DO  aspirin EC 81 MG tablet Take 1 tablet (81 mg total) by mouth daily. Swallow whole. 09/29/20   Raiford Noble Latif, DO  clotrimazole (LOTRIMIN) 1 % cream Apply topically 2 (two) times daily. 09/29/20   Raiford Noble Latif, DO  colchicine 0.6 MG tablet Take 1 tablet (0.6 mg total) by mouth daily. 09/29/20   Raiford Noble Latif, DO  dextromethorphan-guaiFENesin [facility] DM) 30-600 MG 12hr tablet Take 1 tablet by mouth 2 (two) times daily as needed for cough. 09/29/20   Raiford Noble Latif, DO  docusate sodium (COLACE) 100 MG capsule Take 1 capsule (100 mg total) by mouth 2 (two) times daily. 09/29/20   Raiford Noble Latif, DO  Famotidine (ACID CONTROLLER PO) Take 1 tablet by mouth daily as needed (reflux).    [provider]  folic acid (FOLVITE) 1 MG tablet Take 1 tablet (1 mg total) by mouth daily. 09/30/20   Raiford Noble Latif, DO  metoprolol tartrate (LOPRESSOR) 25 MG tablet Take 1 tablet (25 mg total) by mouth 2 (two) times daily. 09/29/20   Raiford Noble Latif, DO  Multiple Vitamin (MULTIVITAMIN WITH MINERALS) TABS tablet Take 1 tablet by mouth daily. 09/30/20   Raiford Noble Latif, DO  pantoprazole (PROTONIX) 40 MG tablet Take 1 tablet (40 mg total) by mouth at bedtime. 09/29/20   Sheikh, Georgina Quint Latif, DO  polyethylene glycol (MIRALAX / GLYCOLAX) 17 g packet Take 17 g by mouth daily. 09/30/20   Sheikh, Omair  Latif, DO  predniSONE (STERAPRED UNI-PAK 21 TAB) 10 MG (21) TBPK tablet Take 6 pills on day 1, 5 pills on day 2, 4 pills on day 3, 3 pills on day 4, 2 pills on day 5, 1 pill on day 6 and  then stop on day 7 09/29/20   Raiford Noble Latif, DO  sertraline (ZOLOFT) 25 MG tablet Take 1 tablet (25 mg total) by mouth daily. 09/29/20   Raiford Noble Latif, DO  sucralfate (CARAFATE) 1 GM/10ML suspension Take 10 mLs (1 g total) by mouth 4 (four) times daily -  with meals and at bedtime. 09/29/20 10/29/20  Raiford Noble Latif, DO  thiamine 100 MG tablet Take 1 tablet (100 mg total) by mouth daily. 09/30/20   Raiford Noble Latif, DO  traMADol (ULTRAM) 50 MG tablet Take 1 tablet (50 mg total) by mouth every 6 (six) hours as needed for moderate pain or severe pain. 09/29/20   Kerney Elbe, DO     Critical care time: 45 minutes

## 2020-10-01 NOTE — Progress Notes (Signed)
NAME:  Todd [name]., MRN:  [id number], DOB:  1956-03-29, LOS: 0 ADMISSION DATE:  09/30/2020,   Brief History:  65 year old male that presented to the emergency room on 2/15 from the SNF.  The patient had overdosed on heroin while at the SNF.  He states he did approximately $40 worth of heroin by snorting it.  Patient had been discharged on 09/29/2020 from the hospital and sent to the SNF.  During that hospital stay he was originally admitted and intubated for encephalopathy, Covid pneumonia, new onset seizures, gout.  Patient states that he went to the SNF and was having issues with left knee pain, that is a chronic issue, and took the heroin to self medicate at approximately 1600 hrs. He was found to be somnolent. Patient was given Narcan by EMS with good effect but he later became somnolent again in the ER afer a second dose and required a Narcan infusion.  He was admitted to the ICU for further care.  Past Medical History:  Encephalopathy CKD 3 Transaminitis Hepatitis C EtOH abuse Microcytic anemia Ulcerative esophagitis Depression DVT/PE Hypertension   Significant Diagnostic Tests:  Chest x-ray slowly improving  Micro Data:  Covid negative  Antimicrobials:  None  Interim History / Subjective:  Patient is complaining of left knee pain and headache this morning. He denies shortness of breath, chest pain, nausea or vomiting. He is hungry.    Objective   Blood pressure 131/79, pulse 69, temperature 98.3 F (36.8 C), temperature source Oral, resp. rate 17, height 5\' 6"  (1.676 m), weight 85.7 kg, SpO2 93 %.        Intake/Output Summary (Last 24 hours) at 10/01/2020 0834 Last data filed at 10/01/2020 0653 Gross per 24 hour  Intake 1840.52 ml  Output 200 ml  Net 1640.52 ml   Filed Weights   09/30/20 2058 10/01/20 0220  Weight: 78.6 kg 85.7 kg    Examination: General: No acute distress HENT: [place]/AT, PERRL, moist mucous membranes Lungs: Clear to auscultation no  wheezing rales or rhonchi noted Cardiovascular: RRR, no murmurs Abdomen: Soft, nontender, nondistended, no rebound/rigidity/guarding Extremities: no edema, warm. Tender left knee - no erythema, warmth or swelling Neuro: A&O x3, moving all extremities.   Assessment & Plan:  Acute heroin overdose-accidental Acute on CKD II Intravascular volume depletion Recent Covid pneumonia Hepatitis C and  EtOH hepatitis Depression Hypertension  Plan: Stop narcan drip and monitor for somnolence Continue IV fluids for now Re-check labs this PM Start PO diet Tylenol PRN for headache and knee pain, will resume tramadol once stable off narcan drip. Resume home zoloft.  Resume home amlodipine and metoprolol for hypertension  Best practice (evaluated daily)  Diet: Full Pain/Anxiety/Delirium protocol (if indicated): None VAP protocol (if indicated): Does not apply DVT prophylaxis: Lovenox GI prophylaxis: Protonix Glucose control: Monitor blood sugar Mobility: OOB to chair once off narcan drip Disposition: ICU, will transfer once off narcan drip  Goals of Care:   Code Status: Full code  Labs   CBC: Recent Labs  Lab 09/26/20 0305 09/27/20 0359 09/28/20 0338 09/29/20 0106 09/30/20 2120 10/01/20 0249  WBC 11.5* 13.0* 11.6* 13.8* 16.1* 14.1*  NEUTROABS 7.7 8.5* 6.7 8.5* 11.7*  --   HGB 11.9* 11.8* 12.0* 11.9* 13.2 12.0*  HCT 36.5* 36.5* 38.5* 37.5* 44.4 39.5  MCV 95.3 96.3 97.2 95.4 103.3* 102.3*  PLT 351 348 348 346 333 659    Basic Metabolic Panel: Recent Labs  Lab 09/26/20 0305 09/27/20 0359 09/28/20 0338 09/29/20 0106  09/30/20 0000 09/30/20 2120 10/01/20 0249  NA 139 140 141 140 143 143 142  K 5.0 4.4 4.3 4.7 5.7* 5.3* 5.2*  CL 107 105 105 105 106 109 108  CO2 23 24 26 24 22  20* 21*  GLUCOSE 166* 123* 107* 117* 110* 110* 118*  BUN 17 20 17  25* 43* 39* 42*  CREATININE 1.19 1.17 1.14 1.15 2.44* 2.12* 2.09*  CALCIUM 9.6 9.4 9.4 9.3 9.3 9.1 9.0  MG 1.9 1.6* 1.8 2.1  --    --  2.0  PHOS 2.1* 3.2 4.2 4.6  --   --  7.3*   GFR: Estimated Creatinine Clearance: 36.7 mL/min (A) (by C-G formula based on SCr of 2.09 mg/dL (H)). Recent Labs  Lab 09/28/20 0338 09/29/20 0106 09/30/20 2120 10/01/20 0249  WBC 11.6* 13.8* 16.1* 14.1*    Liver Function Tests: Recent Labs  Lab 09/27/20 0359 09/28/20 0338 09/29/20 0106 09/30/20 0000 09/30/20 2120  AST 32 39 29 55* 53*  ALT 33 37 34 58* 56*  ALKPHOS 47 49 48 53 51  BILITOT 0.4 0.6 0.6 0.5 0.3  PROT 6.8 6.8 6.7 8.1 7.6  ALBUMIN 2.6* 2.6* 2.6* 3.6 3.4*   No results for input(s): LIPASE, AMYLASE in the last 168 hours. No results for input(s): AMMONIA in the last 168 hours.  ABG    Component Value Date/Time   PHART 7.133 (LL) 09/03/2020 1803   PCO2ART 54.3 (H) 09/03/2020 1803   PO2ART 108 09/03/2020 1803   HCO3 17.9 (L) 09/03/2020 1803   TCO2 19 (L) 09/03/2020 1803   ACIDBASEDEF 11.0 (H) 09/03/2020 1803   O2SAT 95.0 09/03/2020 1803     Coagulation Profile: No results for input(s): INR, PROTIME in the last 168 hours.  Cardiac Enzymes: No results for input(s): CKTOTAL, CKMB, CKMBINDEX, TROPONINI in the last 168 hours.  HbA1C: Hgb A1c MFr Bld  Date/Time Value Ref Range Status  09/03/2020 08:09 PM 5.5 4.8 - 5.6 % Final    Comment:    (NOTE) Pre diabetes:          5.7%-6.4%  Diabetes:              >6.4%  Glycemic control for   <7.0% adults with diabetes   01/10/2019 05:08 PM 6.4 (H) 4.8 - 5.6 % Final    Comment:    (NOTE) Pre diabetes:          5.7%-6.4% Diabetes:              >6.4% Glycemic control for   <7.0% adults with diabetes     CBG: Recent Labs  Lab 09/28/20 1944 09/29/20 0745 09/29/20 1225 09/29/20 1631 10/01/20 0811  GLUCAP 166* 85 182* 217* 84    Critical care time: 40 minutes   Freda Jackson, MD [facility] Office: [phone]   See Amion for Pager Details  \

## 2020-10-02 DIAGNOSIS — T401X1D Poisoning by heroin, accidental (unintentional), subsequent encounter: Secondary | ICD-10-CM | POA: Diagnosis not present

## 2020-10-02 LAB — URINALYSIS, ROUTINE W REFLEX MICROSCOPIC
Bilirubin Urine: NEGATIVE
Glucose, UA: NEGATIVE mg/dL
Hgb urine dipstick: NEGATIVE
Ketones, ur: NEGATIVE mg/dL
Leukocytes,Ua: NEGATIVE
Nitrite: NEGATIVE
Protein, ur: NEGATIVE mg/dL
Specific Gravity, Urine: 1.005 (ref 1.005–1.030)
pH: 6 (ref 5.0–8.0)

## 2020-10-02 LAB — GLUCOSE, CAPILLARY
Glucose-Capillary: 78 mg/dL (ref 70–99)
Glucose-Capillary: 139 mg/dL — ABNORMAL HIGH (ref 70–99)
Glucose-Capillary: 108 mg/dL — ABNORMAL HIGH (ref 70–99)
Glucose-Capillary: 113 mg/dL — ABNORMAL HIGH (ref 70–99)

## 2020-10-02 NOTE — Progress Notes (Signed)
PROGRESS NOTE  Todd [name].  DOB: 1956-03-06  PCP: [name], MD PNT:[id number]  DOA: 09/30/2020  LOS: 1 day   Chief Complaint  Patient presents with  . Drug Overdose    Heroin overdose    Brief narrative: Ronaldo Crilly. is a 65 y.o. male with PMH significant for DM2, HTN, multisubstance abuse including alcohol, tobacco, heroin CKD 3, DVT/PE, ulcerative esophagitis, hepatitis C, depression. Patient had a long hospitalization from 09/03/2020 to 09/29/2020 at [facility].  He was homeless prior to that, brought in unconscious from a shelter, intubated, subsequently extubated, found to be Covid positive.  He could not be discharged back to [facility] because of Covid test positivity.  He was hence sent to [facility], [place]. The very next day on 2/15 patient was brought to the ED from the rehab where he overdosed on heroine.  EMS noted periods of apnea and gave him IV Narcan in route to the hospital which brought him back to alertness but it did not last long and hence patient was started on Narcan infusion in the ED.  With Narcan infusion, patient was awake and following commands appropriately.  He was admitted to ICU and gradually weaned down from Narcan infusion. Transferred out to hospitalist service on 2/17.  Subjective: Patient was seen and examined this morning. Lying down in bed.  Not in distress.  Not on supplemental oxygen. Chart reviewed. No fever, heart rate in 50s, breathing on room air Pending labs this morning  Assessment/Plan: Acute heroin overdose-accidental -Patient states he was trying to self medicate himself for chronic knee pain. -Initially required Narcan infusion.  Currently off. -Mental status intact at this time.  AKI on CKD 2 -Creatinine normal at baseline, presented at 2.44, trending down 1.5 today.  Continue LR at 100 mill per hour. Recent Labs    09/24/20 0257 09/25/20 0233 09/26/20 0305 09/27/20 0359 09/28/20 0338  09/29/20 0106 09/30/20 0000 09/30/20 2120 10/01/20 0249 10/01/20 1305  BUN 11 13 17 20 17  25* 43* 39* 42* 34*  CREATININE 1.07 1.21 1.19 1.17 1.14 1.15 2.44* 2.12* 2.09* 1.52*   Essential hypertension -Continue Lopressor 25 mg twice daily amlodipine 5 mg daily  Chronic alcohol abuse History of Hepatitis C  History of ulcerative esophagitis -Continue Protonix  Depression -Continue Zoloft  Chronic knee pain -Continue tramadol and colchicine  Recent Covid infection -Covid positive on 1/19.  Off isolation now  Mobility: PT eval ordered Code Status:   Code Status: Full Code  Nutritional status: Body mass index is 30.85 kg/m.     Diet Order            Diet Carb Modified Fluid consistency: Thin; Room service appropriate? Yes  Diet effective now                 DVT prophylaxis: enoxaparin (LOVENOX) injection 40 mg Start: 10/01/20 1000 SCDs Start: 10/01/20 0048   Antimicrobials:  None Fluid: LR at 100 mill per hour Consultants: None Family Communication:  None at bedside  Status is: Inpatient  Remains inpatient appropriate because: Continue IV fluid  Dispo: The patient is from: SNF              Anticipated d/c is to: SNF               Anticipated d/c date is: 2 days              Patient currently is not medically stable to d/c.   Difficult  to place patient No       Infusions:  . lactated ringers 100 mL/hr at 10/02/20 1502  . naLOXone [facility]) adult infusion for OVERDOSE Stopped (10/01/20 1250)    Scheduled Meds: . amLODipine  5 mg Oral Daily  . aspirin EC  81 mg Oral Daily  . Chlorhexidine Gluconate Cloth  6 each Topical Daily  . colchicine  0.6 mg Oral Daily  . enoxaparin (LOVENOX) injection  40 mg Subcutaneous Q24H  . insulin aspart  0-5 Units Subcutaneous QHS  . insulin aspart  0-9 Units Subcutaneous TID WC  . mouth rinse  15 mL Mouth Rinse BID  . metoprolol tartrate  25 mg Oral BID  . pantoprazole  40 mg Oral Daily  . sertraline  25 mg Oral  Daily  . thiamine  100 mg Oral Daily    Antimicrobials: Anti-infectives (From admission, onward)   None      PRN meds: acetaminophen, albuterol, docusate sodium, haloperidol lactate, hydrOXYzine, ondansetron (ZOFRAN) IV, polyethylene glycol   Objective: Vitals:   10/02/20 1300 10/02/20 1400  BP: (!) 158/144 (!) 142/86  Pulse: 72 83  Resp:  14  Temp:    SpO2: 94% 96%    Intake/Output Summary (Last 24 hours) at 10/02/2020 1523 Last data filed at 10/02/2020 1502 Gross per 24 hour  Intake 2863 ml  Output 2425 ml  Net 438 ml   Filed Weights   09/30/20 2058 10/01/20 0220 10/02/20 0354  Weight: 78.6 kg 85.7 kg 86.7 kg   Weight change: 8.1 kg Body mass index is 30.85 kg/m.   Physical Exam: General exam: Pleasant middle-aged male not in distress Skin: No rashes, lesions or ulcers. HEENT: Atraumatic, normocephalic, no obvious bleeding Lungs: Clear to auscultation bilaterally CVS: Regular rate and rhythm, no murmur GI/Abd soft, nontender, nondistended, bowel sound present CNS: Alert, awake, oriented x3 Psychiatry: Mood appropriate Extremities: No pedal edema, no calf tenderness  Data Review: I have personally reviewed the laboratory data and studies available.  Recent Labs  Lab 09/26/20 0305 09/27/20 0359 09/28/20 0338 09/29/20 0106 09/30/20 2120 10/01/20 0249  WBC 11.5* 13.0* 11.6* 13.8* 16.1* 14.1*  NEUTROABS 7.7 8.5* 6.7 8.5* 11.7*  --   HGB 11.9* 11.8* 12.0* 11.9* 13.2 12.0*  HCT 36.5* 36.5* 38.5* 37.5* 44.4 39.5  MCV 95.3 96.3 97.2 95.4 103.3* 102.3*  PLT 351 348 348 346 333 308   Recent Labs  Lab 09/26/20 0305 09/27/20 0359 09/28/20 0338 09/29/20 0106 09/30/20 0000 09/30/20 2120 10/01/20 0249 10/01/20 1305  NA 139 140 141 140 143 143 142 137  K 5.0 4.4 4.3 4.7 5.7* 5.3* 5.2* 4.1  CL 107 105 105 105 106 109 108 104  CO2 23 24 26 24 22  20* 21* 23  GLUCOSE 166* 123* 107* 117* 110* 110* 118* 114*  BUN 17 20 17  25* 43* 39* 42* 34*  CREATININE 1.19  1.17 1.14 1.15 2.44* 2.12* 2.09* 1.52*  CALCIUM 9.6 9.4 9.4 9.3 9.3 9.1 9.0 8.7*  MG 1.9 1.6* 1.8 2.1  --   --  2.0  --   PHOS 2.1* 3.2 4.2 4.6  --   --  7.3*  --     F/u labs ordered Unresulted Labs (From admission, onward)          Start     Ordered   10/08/20 0500  Creatinine, serum  (enoxaparin (LOVENOX)    CrCl < 30 ml/min)  Weekly,   R     Comments: while on enoxaparin therapy.  10/01/20 0058   10/03/20 0500  CBC with Differential/Platelet  Daily,   R     Question:  Specimen collection method  Answer:  Lab=Lab collect   10/02/20 1523   10/03/20 6728  Basic metabolic panel  Daily,   R     Question:  Specimen collection method  Answer:  Lab=Lab collect   10/02/20 1523          Signed, Terrilee Croak, MD [facility] 10/02/2020

## 2020-10-02 NOTE — Consult Note (Signed)
   [facility] CM Inpatient Consult   10/02/2020  Todd [name] [birth year] [id number]   Patient chart has been reviewed for readmissions less than 30 days and for extreme risk score, 42%, for unplanned readmissions. Patient assessed for community [place] [facility] follow up needs. Per chart review, current disposition plan not set.  Will continue to follow for progression and disposition plans.  Of note, [facility] Care Management services does not replace or interfere with any services that are arranged by inpatient case management or social work.  Netta Cedars, MSN, [facility] Liaison Nurse Mobile Phone [phone]  Toll free office [phone]

## 2020-10-03 DIAGNOSIS — T401X1D Poisoning by heroin, accidental (unintentional), subsequent encounter: Secondary | ICD-10-CM | POA: Diagnosis not present

## 2020-10-03 LAB — CBC WITH DIFFERENTIAL/PLATELET
Abs Immature Granulocytes: 0.05 10*3/uL (ref 0.00–0.07)
Basophils Absolute: 0 10*3/uL (ref 0.0–0.1)
Basophils Relative: 1 %
Eosinophils Absolute: 0.3 10*3/uL (ref 0.0–0.5)
Eosinophils Relative: 4 %
HCT: 34.8 % — ABNORMAL LOW (ref 39.0–52.0)
Hemoglobin: 10.7 g/dL — ABNORMAL LOW (ref 13.0–17.0)
Immature Granulocytes: 1 %
Lymphocytes Relative: 34 %
Lymphs Abs: 2.3 10*3/uL (ref 0.7–4.0)
MCH: 30.8 pg (ref 26.0–34.0)
MCHC: 30.7 g/dL (ref 30.0–36.0)
MCV: 100.3 fL — ABNORMAL HIGH (ref 80.0–100.0)
Monocytes Absolute: 0.8 10*3/uL (ref 0.1–1.0)
Monocytes Relative: 12 %
Neutro Abs: 3.2 10*3/uL (ref 1.7–7.7)
Neutrophils Relative %: 48 %
Platelets: 202 10*3/uL (ref 150–400)
RBC: 3.47 MIL/uL — ABNORMAL LOW (ref 4.22–5.81)
RDW: 15.3 % (ref 11.5–15.5)
WBC: 6.6 10*3/uL (ref 4.0–10.5)
nRBC: 0 % (ref 0.0–0.2)

## 2020-10-03 LAB — GLUCOSE, CAPILLARY
Glucose-Capillary: 145 mg/dL — ABNORMAL HIGH (ref 70–99)
Glucose-Capillary: 148 mg/dL — ABNORMAL HIGH (ref 70–99)
Glucose-Capillary: 93 mg/dL (ref 70–99)
Glucose-Capillary: 106 mg/dL — ABNORMAL HIGH (ref 70–99)

## 2020-10-03 LAB — BASIC METABOLIC PANEL
Anion gap: 10 (ref 5–15)
BUN: 22 mg/dL (ref 8–23)
CO2: 25 mmol/L (ref 22–32)
Calcium: 9.1 mg/dL (ref 8.9–10.3)
Chloride: 109 mmol/L (ref 98–111)
Creatinine, Ser: 1.18 mg/dL (ref 0.61–1.24)
GFR, Estimated: >60 mL/min (ref >60)
Glucose, Bld: 141 mg/dL — ABNORMAL HIGH (ref 70–99)
Potassium: 4.3 mmol/L (ref 3.5–5.1)
Sodium: 144 mmol/L (ref 135–145)

## 2020-10-03 NOTE — TOC Progression Note (Addendum)
Transition of Care (TOC) - Progression Note    Patient Details  Name: Todd [name]. MRN: [id number] Date of Birth: 01-Jun-1956  Transition of Care [facility]) CM/SW Contact  Earlie Arciga, Marjie Skiff, RN Phone Number: 10/03/2020, 1:08 PM  Clinical Narrative:    Spoke with Loma Boston at [facility] to see if pt can return there at dc. Loma Boston states that they have no male beds. Due to pt reason for admission, most likely pt will not be able to go to any snfs. Spoke with pt at bedside for dc planning. Pt planning to go to homeless shelter at dc. He states he wants to go to the [employer]. Pt given number for [facility] and he was informed that he needed to call them to find a shelter bed for himself. He was informed that we could transport him where he needs to go via our transport service. Pt has had multiple family members call him at the hospital. Pt states that he can't stay with any of his family. Unsure if pt has actually expressed to his family that he needs a place to stay.   Social Determinants of Health (SDOH) Interventions    Readmission Risk Interventions Readmission Risk Prevention Plan 09/16/2020  Transportation Screening Complete  PCP or Specialist Appt within 5-7 Days Complete  Home Care Screening Complete  Medication Review (RN CM) Referral to Pharmacy  Some recent data might be hidden

## 2020-10-03 NOTE — Progress Notes (Signed)
PROGRESS NOTE  Todd [name].  DOB: 1955-11-28  PCP: [name], MD QJJ:[id number]  DOA: 09/30/2020  LOS: 2 days   Chief Complaint  Patient presents with  . Drug Overdose    Heroin overdose    Brief narrative: Todd Vences. is a 65 y.o. male with PMH significant for DM2, HTN, multisubstance abuse including alcohol, tobacco, heroin CKD 3, DVT/PE, ulcerative esophagitis, hepatitis C, depression. Patient had a long hospitalization from 09/03/2020 to 09/29/2020 at [facility].  He was homeless prior to that, brought in unconscious from a shelter, intubated, subsequently extubated, found to be Covid positive.  He could not be discharged back to [facility] because of Covid test positivity.  He was hence sent to [facility], [place]. The very next day on 2/15 patient was brought to the ED from the rehab where he overdosed on heroine.  EMS noted periods of apnea and gave him IV Narcan in route to the hospital which brought him back to alertness but it did not last long and hence patient was started on Narcan infusion in the ED.  With Narcan infusion, patient was awake and following commands appropriately.  He was admitted to ICU and gradually weaned down from Narcan infusion. Transferred out to hospitalist service on 2/17.  Subjective: Patient seen and examined.  He has no complaint other than left knee pain which is chronic for him.  He is alert and oriented.  He does not want to go back to the SNF facility and instead wants to go to [facility].  He wants to talk to the Education officer, museum.  I have conveyed his message.  He does not feel like going there today though.   Assessment/Plan: Acute heroin overdose-accidental -Patient states he was trying to self medicate himself for chronic knee pain. -Initially required Narcan infusion.  Currently off. -Mental status intact at this time.  He is fully alert and oriented.  AKI on CKD 2 Resolved. Recent Labs     09/25/20 0233 09/26/20 0305 09/27/20 0359 09/28/20 0338 09/29/20 0106 09/30/20 0000 09/30/20 2120 10/01/20 0249 10/01/20 1305 10/03/20 0503  BUN 13 17 20 17  25* 43* 39* 42* 34* 22  CREATININE 1.21 1.19 1.17 1.14 1.15 2.44* 2.12* 2.09* 1.52* 1.18   Essential hypertension -Controlled, continue Lopressor 25 mg twice daily amlodipine 5 mg daily  Chronic alcohol abuse History of Hepatitis C  History of ulcerative esophagitis -Continue Protonix  Depression -Continue Zoloft  Chronic knee pain -Continue tramadol and colchicine  Recent Covid infection -Covid positive on 1/19.  Off isolation now  Mobility: PT eval ordered Code Status:   Code Status: Full Code  Nutritional status: Body mass index is 30.85 kg/m.     Diet Order            Diet Carb Modified Fluid consistency: Thin; Room service appropriate? Yes  Diet effective now                 DVT prophylaxis: enoxaparin (LOVENOX) injection 40 mg Start: 10/01/20 1000 SCDs Start: 10/01/20 0048   Antimicrobials:  None Fluid: LR at 100 mill per hour Consultants: None Family Communication:  None at bedside  Status is: Inpatient  Remains inpatient appropriate because: Continue IV fluid  Dispo: The patient is from: SNF              Anticipated d/c is to: SNF vs shelter              Anticipated d/c date  is: 1 days              Patient currently is medically stable to d/c.   Difficult to place patient yes       Infusions:  . lactated ringers 100 mL/hr at 10/03/20 0058    Scheduled Meds: . amLODipine  5 mg Oral Daily  . aspirin EC  81 mg Oral Daily  . Chlorhexidine Gluconate Cloth  6 each Topical Daily  . colchicine  0.6 mg Oral Daily  . enoxaparin (LOVENOX) injection  40 mg Subcutaneous Q24H  . insulin aspart  0-5 Units Subcutaneous QHS  . insulin aspart  0-9 Units Subcutaneous TID WC  . mouth rinse  15 mL Mouth Rinse BID  . metoprolol tartrate  25 mg Oral BID  . pantoprazole  40 mg Oral Daily  .  sertraline  25 mg Oral Daily  . thiamine  100 mg Oral Daily    Antimicrobials: Anti-infectives (From admission, onward)   None      PRN meds: acetaminophen, albuterol, docusate sodium, haloperidol lactate, hydrOXYzine, ondansetron (ZOFRAN) IV, polyethylene glycol   Objective: Vitals:   10/03/20 0153 10/03/20 0548  BP: 130/80 140/88  Pulse: 84 81  Resp: 16 14  Temp: 98.7 F (37.1 C) 98.3 F (36.8 C)  SpO2: 94% 94%    Intake/Output Summary (Last 24 hours) at 10/03/2020 1053 Last data filed at 10/03/2020 0743 Gross per 24 hour  Intake 1143 ml  Output 3025 ml  Net -1882 ml   Filed Weights   09/30/20 2058 10/01/20 0220 10/02/20 0354  Weight: 78.6 kg 85.7 kg 86.7 kg   Weight change:  Body mass index is 30.85 kg/m.   Physical Exam: General exam: Appears calm and comfortable  Respiratory system: Clear to auscultation. Respiratory effort normal. Cardiovascular system: S1 & S2 heard, RRR. No JVD, murmurs, rubs, gallops or clicks. No pedal edema. Gastrointestinal system: Abdomen is nondistended, soft and nontender. No organomegaly or masses felt. Normal bowel sounds heard. Central nervous system: Alert and oriented. No focal neurological deficits. Extremities: Symmetric 5 x 5 power. Skin: No rashes, lesions or ulcers.  Psychiatry: Judgement and insight appear normal. Mood & affect appropriate.    Data Review: I have personally reviewed the laboratory data and studies available.  Recent Labs  Lab 09/27/20 0359 09/28/20 0338 09/29/20 0106 09/30/20 2120 10/01/20 0249 10/03/20 0503  WBC 13.0* 11.6* 13.8* 16.1* 14.1* 6.6  NEUTROABS 8.5* 6.7 8.5* 11.7*  --  3.2  HGB 11.8* 12.0* 11.9* 13.2 12.0* 10.7*  HCT 36.5* 38.5* 37.5* 44.4 39.5 34.8*  MCV 96.3 97.2 95.4 103.3* 102.3* 100.3*  PLT 348 348 346 333 308 202   Recent Labs  Lab 09/27/20 0359 09/28/20 0338 09/29/20 0106 09/30/20 0000 09/30/20 2120 10/01/20 0249 10/01/20 1305 10/03/20 0503  NA 140 141 140 143  143 142 137 144  K 4.4 4.3 4.7 5.7* 5.3* 5.2* 4.1 4.3  CL 105 105 105 106 109 108 104 109  CO2 24 26 24 22  20* 21* 23 25  GLUCOSE 123* 107* 117* 110* 110* 118* 114* 141*  BUN 20 17 25* 43* 39* 42* 34* 22  CREATININE 1.17 1.14 1.15 2.44* 2.12* 2.09* 1.52* 1.18  CALCIUM 9.4 9.4 9.3 9.3 9.1 9.0 8.7* 9.1  MG 1.6* 1.8 2.1  --   --  2.0  --   --   PHOS 3.2 4.2 4.6  --   --  7.3*  --   --     F/u labs  ordered Unresulted Labs (From admission, onward)          Start     Ordered   10/08/20 0500  Creatinine, serum  (enoxaparin (LOVENOX)    CrCl < 30 ml/min)  Weekly,   R     Comments: while on enoxaparin therapy.    10/01/20 0058   10/03/20 0500  CBC with Differential/Platelet  Daily,   R     Question:  Specimen collection method  Answer:  Lab=Lab collect   10/02/20 1523   10/03/20 4008  Basic metabolic panel  Daily,   R     Question:  Specimen collection method  Answer:  Lab=Lab collect   10/02/20 1523          Signed, Darliss Cheney, MD [facility] 10/03/2020

## 2020-10-03 NOTE — Care Management Important Message (Signed)
Important Message  Patient Details IM Letter given to the Patient. Name: Todd [name]. MRN: [id number] Date of Birth: May 18, 1956   Medicare Important Message Given:  Yes     Kerin Salen 10/03/2020, 9:57 AM

## 2020-10-03 NOTE — Evaluation (Signed)
Physical Therapy Evaluation Patient Details Name: Todd [name]. MRN: [id number] DOB: 26-Jul-1956 Today's Date: 10/03/2020   History of Present Illness  65 year old with history of DM2, HTN, CKD, alcohol/tobacco, heroin abuse, Hepatitis C presents due to overdosed on heroin while at the SNF.  Clinical Impression  Pt admitted with above diagnosis. Pt previously independent and living at [facility], ambulating without AD until going to SNF recently. Pt currently varies with transfers, very inconsistent and impulsive, requiring constant cues for safety and assistance to avoid falls. Pt wishes to return to shelter and states he has a RW to assist him. Due to inconsistencies in mobility, currently recommending SNF due to increased risk for falls with possible d/c to HHPT with improvements. Pt currently with functional limitations due to the deficits listed below (see PT Problem List). Pt will benefit from skilled PT to increase their independence and safety with mobility to allow discharge to the venue listed below.       Follow Up Recommendations SNF    Equipment Recommendations  None recommended by PT    Recommendations for Other Services       Precautions / Restrictions Precautions Precautions: Fall Restrictions Weight Bearing Restrictions: No      Mobility  Bed Mobility Overal bed mobility: Needs Assistance;Modified Independent  General bed mobility comments: supine to sit, no physical assist to come to sitting EOB only cues for safety with condom catheter line    Transfers Overall transfer level: Needs assistance Equipment used: Rolling walker (2 wheeled) Transfers: Sit to/from Stand Sit to Stand: Min guard;Mod assist Stand pivot transfers: Min guard    General transfer comment: pt very impulsive, and transfers vary; initial STS rep with RW and min G assist as pt is rushing to get to restroom. After toileting, pt requiring mod A to power up from elevated BSC,  multiple attempts and very unsteady. Pt completes stand pivot from [facility] to recliner with min G assist then rolled back into room  Ambulation/Gait Ambulation/Gait assistance: Min assist;+2 safety/equipment  Assistive device: Rolling walker (2 wheeled) Gait Pattern/deviations: Step-through pattern;Decreased stride length;Trunk flexed Gait velocity: decreased   General Gait Details: pt very impulsive and unsteady, +2 assist for safety, pt rushing to get to restroom despite commands for safety and sequencing. Pt unable to ambulate back to bed after toileting due to L knee pain and requires stand pivot to recliner  Stairs            Wheelchair Mobility    Modified Rankin (Stroke Patients Only)       Balance Overall balance assessment: Needs assistance Sitting-balance support: Feet supported;Bilateral upper extremity supported Sitting balance-Leahy Scale: Good Sitting balance - Comments: seated EOB   Standing balance support: During functional activity;Bilateral upper extremity supported Standing balance-Leahy Scale: Poor Standing balance comment: reliant on single or bil UE support                Pertinent Vitals/Pain Pain Assessment: 0-10 Pain Score: 6  Pain Location: left knee Pain Descriptors / Indicators: Aching;Sore Pain Intervention(s): Limited activity within patient's tolerance;Monitored during session;Repositioned    Home Living Family/patient expects to be discharged to:: Shelter/Homeless                 Additional Comments: Pt reports wanting to return to [employer] with RW.    Prior Function Level of Independence: Independent         Comments: Pt reports previously independent wtih mobility, now has a RW. Pt reports recently  going to SNF and now "I can't do much"     Hand Dominance        Extremity/Trunk Assessment   Upper Extremity Assessment Upper Extremity Assessment: Generalized weakness    Lower Extremity Assessment Lower  Extremity Assessment: Generalized weakness (AROM WNL, strength grossly 3+/5, denies numbness/tingling)    Cervical / Trunk Assessment Cervical / Trunk Assessment: Kyphotic  Communication   Communication: No difficulties  Cognition Arousal/Alertness: Awake/alert Behavior During Therapy: Agitated;Impulsive Overall Cognitive Status: No family/caregiver present to determine baseline cognitive functioning Area of Impairment: Following commands;Safety/judgement  Following Commands: Follows one step commands inconsistently;Follows multi-step commands inconsistently Safety/Judgement: Decreased awareness of deficits;Decreased awareness of safety   Problem Solving: Slow processing General Comments: Pt very impulsive with mobility, disregards cues from therapist for sequencing and safety. Pt inconsistently follows commands, unsure if impulsive or cognition.      General Comments      Exercises     Assessment/Plan    PT Assessment Patient needs continued PT services  PT Problem List Decreased strength;Decreased range of motion;Decreased activity tolerance;Decreased balance;Decreased mobility;Decreased cognition;Decreased knowledge of use of DME;Decreased safety awareness;Obesity;Pain       PT Treatment Interventions DME instruction;Gait training;Functional mobility training;Therapeutic activities;Therapeutic exercise;Balance training;Neuromuscular re-education;Patient/family education    PT Goals (Current goals can be found in the Care Plan section)  Acute Rehab PT Goals Patient Stated Goal: [facility] to [facility]W PT Goal Formulation: With patient Time For Goal Achievement: 10/17/20 Potential to Achieve Goals: Fair    Frequency Min 2X/week   Barriers to discharge   Technical sales engineer    Co-evaluation               AM-PAC PT "6 Clicks" Mobility  Outcome Measure Help needed turning from your back to your side while in a flat bed without  using bedrails?: None Help needed moving from lying on your back to sitting on the side of a flat bed without using bedrails?: None Help needed moving to and from a bed to a chair (including a wheelchair)?: A Little Help needed standing up from a chair using your arms (e.g., wheelchair or bedside chair)?: A Little Help needed to walk in hospital room?: A Little Help needed climbing 3-5 steps with a railing? : A Lot 6 Click Score: 19    End of Session Equipment Utilized During Treatment: Gait belt Activity Tolerance: Patient tolerated treatment well Patient left: in chair;with call bell/phone within reach;with chair alarm set Nurse Communication: Mobility status PT Visit Diagnosis: Unsteadiness on feet (R26.81);Other abnormalities of gait and mobility (R26.89);Muscle weakness (generalized) (M62.81);Pain Pain - Right/Left: Left Pain - part of body: Knee    Time: 1050-1110 PT Time Calculation (min) (ACUTE ONLY): 20 min   Charges:   PT Evaluation $PT Eval Moderate Complexity: 1 Mod           Tori Jackqulyn Mendel PT, DPT 10/03/20, 1:01 PM

## 2020-10-04 DIAGNOSIS — T401X1D Poisoning by heroin, accidental (unintentional), subsequent encounter: Secondary | ICD-10-CM | POA: Diagnosis not present

## 2020-10-04 LAB — GLUCOSE, CAPILLARY
Glucose-Capillary: 95 mg/dL (ref 70–99)
Glucose-Capillary: 147 mg/dL — ABNORMAL HIGH (ref 70–99)
Glucose-Capillary: 112 mg/dL — ABNORMAL HIGH (ref 70–99)

## 2020-10-04 LAB — CBC WITH DIFFERENTIAL/PLATELET
Abs Immature Granulocytes: 0.03 10*3/uL (ref 0.00–0.07)
Basophils Absolute: 0 10*3/uL (ref 0.0–0.1)
Basophils Relative: 1 %
Eosinophils Absolute: 0.3 10*3/uL (ref 0.0–0.5)
Eosinophils Relative: 7 %
HCT: 35.7 % — ABNORMAL LOW (ref 39.0–52.0)
Hemoglobin: 11.2 g/dL — ABNORMAL LOW (ref 13.0–17.0)
Immature Granulocytes: 1 %
Lymphocytes Relative: 40 %
Lymphs Abs: 1.9 10*3/uL (ref 0.7–4.0)
MCH: 31 pg (ref 26.0–34.0)
MCHC: 31.4 g/dL (ref 30.0–36.0)
MCV: 98.9 fL (ref 80.0–100.0)
Monocytes Absolute: 0.8 10*3/uL (ref 0.1–1.0)
Monocytes Relative: 16 %
Neutro Abs: 1.6 10*3/uL — ABNORMAL LOW (ref 1.7–7.7)
Neutrophils Relative %: 35 %
Platelets: 217 10*3/uL (ref 150–400)
RBC: 3.61 MIL/uL — ABNORMAL LOW (ref 4.22–5.81)
RDW: 15 % (ref 11.5–15.5)
WBC: 4.7 10*3/uL (ref 4.0–10.5)
nRBC: 0 % (ref 0.0–0.2)

## 2020-10-04 LAB — BASIC METABOLIC PANEL
Anion gap: 9 (ref 5–15)
BUN: 17 mg/dL (ref 8–23)
CO2: 26 mmol/L (ref 22–32)
Calcium: 9.3 mg/dL (ref 8.9–10.3)
Chloride: 105 mmol/L (ref 98–111)
Creatinine, Ser: 1.2 mg/dL (ref 0.61–1.24)
GFR, Estimated: >60 mL/min (ref >60)
Glucose, Bld: 92 mg/dL (ref 70–99)
Potassium: 4.7 mmol/L (ref 3.5–5.1)
Sodium: 140 mmol/L (ref 135–145)
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
## 2020-10-05 DIAGNOSIS — T401X1D Poisoning by heroin, accidental (unintentional), subsequent encounter: Secondary | ICD-10-CM | POA: Diagnosis not present

## 2020-10-05 LAB — BASIC METABOLIC PANEL
Anion gap: 10 (ref 5–15)
BUN: 24 mg/dL — ABNORMAL HIGH (ref 8–23)
CO2: 25 mmol/L (ref 22–32)
Calcium: 9.6 mg/dL (ref 8.9–10.3)
Chloride: 104 mmol/L (ref 98–111)
Creatinine, Ser: 1.4 mg/dL — ABNORMAL HIGH (ref 0.61–1.24)
GFR, Estimated: 56 mL/min — ABNORMAL LOW (ref >60)
Glucose, Bld: 117 mg/dL — ABNORMAL HIGH (ref 70–99)
Potassium: 4.4 mmol/L (ref 3.5–5.1)
Sodium: 139 mmol/L (ref 135–145)

## 2020-10-05 LAB — GLUCOSE, CAPILLARY
Glucose-Capillary: 120 mg/dL — ABNORMAL HIGH (ref 70–99)
Glucose-Capillary: 133 mg/dL — ABNORMAL HIGH (ref 70–99)
Glucose-Capillary: 107 mg/dL — ABNORMAL HIGH (ref 70–99)
Glucose-Capillary: 155 mg/dL — ABNORMAL HIGH (ref 70–99)
Glucose-Capillary: 110 mg/dL — ABNORMAL HIGH (ref 70–99)

## 2020-10-05 LAB — CBC WITH DIFFERENTIAL/PLATELET
Abs Immature Granulocytes: 0.02 10*3/uL (ref 0.00–0.07)
Basophils Absolute: 0 10*3/uL (ref 0.0–0.1)
Basophils Relative: 1 %
Eosinophils Absolute: 0.3 10*3/uL (ref 0.0–0.5)
Eosinophils Relative: 6 %
HCT: 37.6 % — ABNORMAL LOW (ref 39.0–52.0)
Hemoglobin: 11.8 g/dL — ABNORMAL LOW (ref 13.0–17.0)
Immature Granulocytes: 0 %
Lymphocytes Relative: 35 %
Lymphs Abs: 1.8 10*3/uL (ref 0.7–4.0)
MCH: 30.9 pg (ref 26.0–34.0)
MCHC: 31.4 g/dL (ref 30.0–36.0)
MCV: 98.4 fL (ref 80.0–100.0)
Monocytes Absolute: 0.7 10*3/uL (ref 0.1–1.0)
Monocytes Relative: 14 %
Neutro Abs: 2.2 10*3/uL (ref 1.7–7.7)
Neutrophils Relative %: 44 %
Platelets: 228 10*3/uL (ref 150–400)
RBC: 3.82 MIL/uL — ABNORMAL LOW (ref 4.22–5.81)
RDW: 15.1 % (ref 11.5–15.5)
WBC: 5 10*3/uL (ref 4.0–10.5)
nRBC: 0 % (ref 0.0–0.2)
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
## 2020-10-06 DIAGNOSIS — T401X1D Poisoning by heroin, accidental (unintentional), subsequent encounter: Secondary | ICD-10-CM | POA: Diagnosis not present

## 2020-10-06 LAB — GLUCOSE, CAPILLARY
Glucose-Capillary: 96 mg/dL (ref 70–99)
Glucose-Capillary: 179 mg/dL — ABNORMAL HIGH (ref 70–99)
Glucose-Capillary: 86 mg/dL (ref 70–99)
Glucose-Capillary: 101 mg/dL — ABNORMAL HIGH (ref 70–99)

## 2020-10-06 LAB — BASIC METABOLIC PANEL
Anion gap: 10 (ref 5–15)
BUN: 23 mg/dL (ref 8–23)
CO2: 24 mmol/L (ref 22–32)
Calcium: 9.5 mg/dL (ref 8.9–10.3)
Chloride: 105 mmol/L (ref 98–111)
Creatinine, Ser: 1.09 mg/dL (ref 0.61–1.24)
GFR, Estimated: >60 mL/min (ref >60)
Glucose, Bld: 102 mg/dL — ABNORMAL HIGH (ref 70–99)
Potassium: 4.5 mmol/L (ref 3.5–5.1)
Sodium: 139 mmol/L (ref 135–145)

## 2020-10-06 NOTE — Progress Notes (Signed)
Physical Therapy Treatment Patient Details Name: Todd [name]. MRN: [id number] DOB: 1956-06-02 Today's Date: 10/06/2020    History of Present Illness 65 year old with history of DM2, HTN, CKD, alcohol/tobacco, heroin abuse, Hepatitis C presents due to overdosed on heroin while at the SNF.    PT Comments    Pt appears agitated this session, refusing therapist to utilize safety belt despite education on purpose and safety with mobility. Pt requests to use restroom prior to walking in hallway, continues to vocalize frustration with therapist and unable to soothe or appease pt. Pt ambulates to restroom with RW and good steadiness, completes toileting independently with door closed per pt's request, exits restroom and takes steps in room before receiving RW from therapist. Pt mobilizing around room without near falls or unsteadiness noted. Once pt returns to sitting EOB, therapist educates pt on end of treatment session due to agitation and pt returns to supine with call bell in hand. Updated d/c recs due to improvement, also now recommending RW due to pt now stating he doesn't have one. Will continue to offer acute PT services.   Follow Up Recommendations  No PT follow up     Equipment Recommendations  Rolling walker with 5" wheels    Recommendations for Other Services       Precautions / Restrictions Precautions Precautions: Fall Restrictions Weight Bearing Restrictions: No    Mobility  Bed Mobility Overal bed mobility: Modified Independent  General bed mobility comments: no physical assist, able to upright trunk and sit EOB with bed positioned in flat position    Transfers Overall transfer level: Needs assistance Equipment used: Rolling walker (2 wheeled) Transfers: Sit to/from Stand Sit to Stand: Supervision  General transfer comment: SUPV for safety, pt impulsive with transfers, therapist attempts to educate pt on RW positioning with transfers with poor  carryover  Ambulation/Gait Ambulation/Gait assistance: Supervision Gait Distance (Feet): 24 Feet (12x2) Assistive device: Rolling walker (2 wheeled) Gait Pattern/deviations: Step-through pattern;Decreased stride length;Trunk flexed  General Gait Details: pt impulsive with RW and speed changes, refuses gait belt to be utilitzed during session, pt ambulates into restroom with RW then walks halfway back to bed withour RW before requesting it from therapist, poor carryover with cues for safety, further ambulation not attempted due to pt's agitation   Stairs             Wheelchair Mobility    Modified Rankin (Stroke Patients Only)       Balance Overall balance assessment: Needs assistance Sitting-balance support: Feet supported Sitting balance-Leahy Scale: Good Sitting balance - Comments: seated EOB   Standing balance support: During functional activity Standing balance-Leahy Scale: Fair Standing balance comment: able to take steps without AD, improved steadiness with RW     Cognition Arousal/Alertness: Awake/alert Behavior During Therapy: Agitated;Impulsive Overall Cognitive Status: No family/caregiver present to determine baseline cognitive functioning  General Comments: Pt agitated throughout, unable to soothe or appease pt, session ended early due to agitation.      Exercises      General Comments        Pertinent Vitals/Pain Pain Assessment: Faces Faces Pain Scale: Hurts a little bit Pain Location: left knee Pain Descriptors / Indicators: Aching;Sore Pain Intervention(s): Limited activity within patient's tolerance;Monitored during session;Premedicated before session    Home Living                      Prior Function            PT Goals (  current goals can now be found in the care plan section) Acute Rehab PT Goals Patient Stated Goal: [facility] PT Goal Formulation: With patient Time For Goal Achievement:  10/17/20 Potential to Achieve Goals: Fair Progress towards PT goals: Progressing toward goals    Frequency    Min 2X/week      PT Plan Discharge plan needs to be updated;Equipment recommendations need to be updated    Co-evaluation              AM-PAC PT "6 Clicks" Mobility   Outcome Measure  Help needed turning from your back to your side while in a flat bed without using bedrails?: None Help needed moving from lying on your back to sitting on the side of a flat bed without using bedrails?: None Help needed moving to and from a bed to a chair (including a wheelchair)?: None Help needed standing up from a chair using your arms (e.g., wheelchair or bedside chair)?: None Help needed to walk in hospital room?: None Help needed climbing 3-5 steps with a railing? : A Little 6 Click Score: 23    End of Session   Activity Tolerance: Treatment limited secondary to agitation Patient left: in bed;with call bell/phone within reach;with bed alarm set Nurse Communication: Mobility status;Other (comment) (agitation) PT Visit Diagnosis: Unsteadiness on feet (R26.81);Other abnormalities of gait and mobility (R26.89);Muscle weakness (generalized) (M62.81);Pain Pain - Right/Left: Left Pain - part of body: Knee     Time: 4585-9292 PT Time Calculation (min) (ACUTE ONLY): 11 min  Charges:  $Gait Training: 8-22 mins                      Tori Booker Bhatnagar PT, DPT 10/06/20, 2:25 PM

## 2020-10-06 NOTE — Care Management Important Message (Signed)
Important Message  Patient Details IM Letter given to the Patient. Name: Todd [name]. MRN: [id number] Date of Birth: Mar 07, 1956   Medicare Important Message Given:  Yes     Kerin Salen 10/06/2020, 10:57 AM

## 2020-10-06 NOTE — Progress Notes (Signed)
PROGRESS NOTE  Todd [name].  DOB: 1955/10/05  PCP: [name], MD GBT:[id number]  DOA: 09/30/2020  LOS: 5 days   Chief Complaint  Patient presents with  . Drug Overdose    Heroin overdose    Brief narrative: Todd [name]. is a 65 y.o. male with PMH significant for DM2, HTN, multisubstance abuse including alcohol, tobacco, heroin CKD 3, DVT/PE, ulcerative esophagitis, hepatitis C, depression. Patient had a long hospitalization from 09/03/2020 to 09/29/2020 at [facility].  He was homeless prior to that, brought in unconscious from a shelter, intubated, subsequently extubated, found to be Covid positive.  He could not be discharged back to [facility] because of Covid test positivity.  He was hence sent to [facility], [place]. The very next day on 2/15 patient was brought to the ED from the rehab where he overdosed on heroine.  EMS noted periods of apnea and gave him IV Narcan in route to the hospital which brought him back to alertness but it did not last long and hence patient was started on Narcan infusion in the ED.  With Narcan infusion, patient was awake and following commands appropriately.  He was admitted to ICU and gradually weaned down from Narcan infusion. Transferred out to hospitalist service on 2/17.  Subjective: Seen and examined.  No complaints. Assessment/Plan: Acute heroin overdose-accidental -Patient states he was trying to self medicate himself for chronic knee pain. -Initially required Narcan infusion.  Currently off. -Mental status intact at this time.  He is fully alert and oriented.  AKI on CKD 2 Resolved. Recent Labs    09/28/20 0338 09/29/20 0106 09/30/20 0000 09/30/20 2120 10/01/20 0249 10/01/20 1305 10/03/20 0503 10/04/20 0550 10/05/20 0602 10/06/20 0533  BUN 17 25* 43* 39* 42* 34* 22 17 24* 23  CREATININE 1.14 1.15 2.44* 2.12* 2.09* 1.52* 1.18 1.20 1.40* 1.09   Essential hypertension -Controlled, continue Lopressor 25  mg twice daily amlodipine 5 mg daily  Chronic alcohol abuse History of Hepatitis C  History of ulcerative esophagitis -Continue Protonix  Depression -Continue Zoloft  Chronic knee pain -He is concerned about gout.  On examination, it is nontender with no effusion and no redness.  Not consistent with gout.  Likely chronic arthritis.  Continue tramadol and colchicine  Recent Covid infection -Covid positive on 1/19.  Off isolation now  Mobility: PT eval ordered Code Status:   Code Status: Full Code  Nutritional status: Body mass index is 30.85 kg/m.     Diet Order            Diet Carb Modified Fluid consistency: Thin; Room service appropriate? Yes  Diet effective now                 DVT prophylaxis: SCDs Start: 10/01/20 0048   Antimicrobials:  None Fluid: LR at 100 mill per hour Consultants: None Family Communication:  None at bedside  Status is: Inpatient  Remains inpatient appropriate because: Continue IV fluid  Dispo: The patient is from: SNF              Anticipated d/c is to: SNF vs shelter.  Unfortunately, he has nowhere to go.  No SNF will offer a bed due to his history of drug abuse.  No shelter available for him.  TOC working on finding a paced solution for displacement on this patient.              Anticipated d/c date is: 1 to 2 days.  Patient currently is medically stable to d/c.   Difficult to place patient yes       Infusions:    Scheduled Meds: . amLODipine  5 mg Oral Daily  . aspirin EC  81 mg Oral Daily  . Chlorhexidine Gluconate Cloth  6 each Topical Daily  . colchicine  0.6 mg Oral Daily  . insulin aspart  0-5 Units Subcutaneous QHS  . insulin aspart  0-9 Units Subcutaneous TID WC  . mouth rinse  15 mL Mouth Rinse BID  . metoprolol tartrate  25 mg Oral BID  . pantoprazole  40 mg Oral Daily  . sertraline  25 mg Oral Daily  . thiamine  100 mg Oral Daily    Antimicrobials: Anti-infectives (From admission, onward)   None       PRN meds: acetaminophen, albuterol, docusate sodium, haloperidol lactate, hydrOXYzine, ondansetron (ZOFRAN) IV, polyethylene glycol   Objective: Vitals:   10/06/20 0500 10/06/20 1010  BP: (!) 138/96   Pulse:  72  Resp: 19   Temp:    SpO2: 94%     Intake/Output Summary (Last 24 hours) at 10/06/2020 1109 Last data filed at 10/06/2020 1000 Gross per 24 hour  Intake 3299.13 ml  Output 2690 ml  Net 609.13 ml   Filed Weights   09/30/20 2058 10/01/20 0220 10/02/20 0354  Weight: 78.6 kg 85.7 kg 86.7 kg   Weight change:  Body mass index is 30.85 kg/m.   Physical Exam:  General exam: Appears calm and comfortable  Respiratory system: Clear to auscultation. Respiratory effort normal. Cardiovascular system: S1 & S2 heard, RRR. No JVD, murmurs, rubs, gallops or clicks. No pedal edema. Gastrointestinal system: Abdomen is nondistended, soft and nontender. No organomegaly or masses felt. Normal bowel sounds heard. Central nervous system: Alert and oriented. No focal neurological deficits. Extremities: Symmetric 5 x 5 power. Skin: No rashes, lesions or ulcers.  Psychiatry: Judgement and insight appear normal. Mood & affect appropriate.    Data Review: I have personally reviewed the laboratory data and studies available.  Recent Labs  Lab 09/30/20 2120 10/01/20 0249 10/03/20 0503 10/04/20 0550 10/05/20 0602  WBC 16.1* 14.1* 6.6 4.7 5.0  NEUTROABS 11.7*  --  3.2 1.6* 2.2  HGB 13.2 12.0* 10.7* 11.2* 11.8*  HCT 44.4 39.5 34.8* 35.7* 37.6*  MCV 103.3* 102.3* 100.3* 98.9 98.4  PLT 333 308 202 217 228   Recent Labs  Lab 10/01/20 0249 10/01/20 1305 10/03/20 0503 10/04/20 0550 10/05/20 0602 10/06/20 0533  NA 142 137 144 140 139 139  K 5.2* 4.1 4.3 4.7 4.4 4.5  CL 108 104 109 105 104 105  CO2 21* 23 25 26 25 24   GLUCOSE 118* 114* 141* 92 117* 102*  BUN 42* 34* 22 17 24* 23  CREATININE 2.09* 1.52* 1.18 1.20 1.40* 1.09  CALCIUM 9.0 8.7* 9.1 9.3 9.6 9.5  MG 2.0  --    --   --   --   --   PHOS 7.3*  --   --   --   --   --     F/u labs ordered Unresulted Labs (From admission, onward)         None      Signed, Darliss Cheney, MD [facility] 10/06/2020

## 2020-10-07 DIAGNOSIS — X58XXXA Exposure to other specified factors, initial encounter: Secondary | ICD-10-CM | POA: Diagnosis not present

## 2020-10-07 DIAGNOSIS — R69 Illness, unspecified: Secondary | ICD-10-CM | POA: Diagnosis not present

## 2020-10-07 DIAGNOSIS — T50901A Poisoning by unspecified drugs, medicaments and biological substances, accidental (unintentional), initial encounter: Secondary | ICD-10-CM | POA: Diagnosis not present

## 2020-10-07 DIAGNOSIS — S80211A Abrasion, right knee, initial encounter: Secondary | ICD-10-CM | POA: Diagnosis not present

## 2020-10-07 DIAGNOSIS — F1120 Opioid dependence, uncomplicated: Secondary | ICD-10-CM | POA: Diagnosis not present

## 2020-10-07 DIAGNOSIS — F3132 Bipolar disorder, current episode depressed, moderate: Secondary | ICD-10-CM | POA: Diagnosis not present

## 2020-10-07 DIAGNOSIS — T401X1D Poisoning by heroin, accidental (unintentional), subsequent encounter: Secondary | ICD-10-CM | POA: Diagnosis not present

## 2020-10-07 DIAGNOSIS — W19XXXA Unspecified fall, initial encounter: Secondary | ICD-10-CM | POA: Diagnosis not present

## 2020-10-07 DIAGNOSIS — I452 Bifascicular block: Secondary | ICD-10-CM | POA: Diagnosis not present

## 2020-10-07 DIAGNOSIS — T401X4A Poisoning by heroin, undetermined, initial encounter: Secondary | ICD-10-CM | POA: Diagnosis not present

## 2020-10-07 DIAGNOSIS — R4182 Altered mental status, unspecified: Secondary | ICD-10-CM | POA: Diagnosis not present

## 2020-10-07 DIAGNOSIS — Y9289 Other specified places as the place of occurrence of the external cause: Secondary | ICD-10-CM | POA: Diagnosis not present

## 2020-10-07 DIAGNOSIS — Z20822 Contact with and (suspected) exposure to covid-19: Secondary | ICD-10-CM | POA: Diagnosis not present

## 2020-10-07 DIAGNOSIS — Y999 Unspecified external cause status: Secondary | ICD-10-CM | POA: Diagnosis not present

## 2020-10-07 LAB — GLUCOSE, CAPILLARY
Glucose-Capillary: 101 mg/dL — ABNORMAL HIGH (ref 70–99)
Glucose-Capillary: 164 mg/dL — ABNORMAL HIGH (ref 70–99)

## 2020-10-07 NOTE — Discharge Instructions (Signed)
Accidental Drug Poisoning, Adult Accidental drug poisoning happens when a person accidentally takes too much of a substance, such as a prescription medicine, an over-the-counter medicine, a vitamin, a supplement, or an illegal drug. The effects of drug poisoning can be mild, dangerous, or even deadly. What are the causes? This condition is caused by taking too much of a medicine, illegal drug, or other substance. It often results from:  Lack of knowledge about a substance.  Using more than one substance at the same time.  An error made by the health care provider who prescribed the substance.  An error made by the pharmacist who filled the prescription.  A lapse in memory, such as forgetting that you have already taken a dose of the medicine.  Suddenly using a substance after a long period of not using it. The following substances and medicines are more likely to cause an accidental drug poisoning:  Medicines that treat mental problems (psychotropic medicines).  Pain medicines.  Cocaine.  Heroin.  Multivitamins that contain iron.  Over-the-counter cold and cough medicines. What increases the risk? This condition is more likely to occur in:  Elderly adults. Elderly adults are at risk because they may: ? Be taking many different medicines. ? Have difficulty reading labels. ? Forget when they last took their medicine.  People who use illegal drugs.  People who drink alcohol while using illegal drugs or certain medicines.  People with certain mental health conditions. What are the signs or symptoms? Symptoms of this condition depend on the substance and the amount that was taken. Common symptoms include:  Behavior changes, such as confusion.  Sleepiness.  Weakness.  Slowed breathing.  Nausea and vomiting.  Seizures.  Very large or small eye pupil size. A drug poisoning can cause a very serious condition in which your blood pressure drops to a low level (shock).  Symptoms of shock include:  Cold and clammy skin.  Pale skin.  Blue lips.  Very slow breathing.  Extreme sleepiness.  Severe confusion.  Dizziness or fainting. How is this diagnosed? This condition is diagnosed based on:  Your symptoms. You will be asked about the substances you took and when you took them.  A physical exam. You may also have other tests, including:  Urine tests.  Blood tests.  An electrocardiogram (ECG). How is this treated? This condition may need to be treated right away at the hospital. Treatment may involve:  Getting fluids and electrolytes through an IV.  Having a breathing tube inserted in your airway (endotracheal tube) to help you breathe.  Taking medicines. These may include medicines that: ? Absorb any substance that is in your digestive system. ? Block or reverse the effect of the substance that caused the drug poisoning.  Having your blood filtered through an artificial kidney machine (hemodialysis).  Ongoing counseling and mental health support. This may be provided if you used an illegal drug. Follow these instructions at home: Medicines  Take over-the-counter and prescription medicines only as told by your health care provider.  Before taking a new medicine, ask your health care provider whether the medicine: ? May cause side effects. ? Might react with other medicines.  Keep a list of all the medicines that you take, including over-the-counter medicines, vitamins, supplements, and herbs. Bring this list with you to all of your medical visits.   General instructions  Drink enough fluid to keep your urine pale yellow.  If you are working with a Social worker or Education officer, community, make  sure to follow his or her instructions.  Do not drink alcohol if: ? Your health care provider tells you not to drink. ? You are pregnant, may be pregnant, or are planning to become pregnant.  If you drink alcohol, limit how much you  have: ? 0-1 drink a day for women. ? 0-2 drinks a day for men.  Be aware of how much alcohol is in your drink. In the [place] one drink equals one typical bottle of beer (12 oz), one-half glass of wine (5 oz), or one shot of hard liquor (1 oz).  Keep all follow-up visits as told by your health care provider. This is important.   How is this prevented?  Get help if you are struggling with: ? Alcohol or drug use. ? Depression or another mental health problem.  Keep the phone number of your local poison control center near your phone or on your cell phone. The hotline of the [employer] is (800336-443-9206.  Store all medicines in safety containers that are out of the reach of children.  Read the drug inserts that come with your medicines.  Create a system for taking your medicine, such as a pillbox, that will help you avoid taking too much of the medicine.  Do not drink alcohol while taking medicines unless your health care provider approves.  Do not use illegal drugs.  Do not take medicines that are not prescribed for you.   Contact a health care provider if:  Your symptoms return.  You develop new symptoms or side effects after taking a medicine.  You have questions about possible drug poisoning. Call your local poison control center at (800) (641)411-5892. Get help right away if:  You think that you or someone else may have taken too much of a substance.  You or someone else is having symptoms of drug poisoning. Summary  Accidental drug poisoning happens when a person accidentally takes too much of a substance, such as a prescription medicine, an over-the-counter medicine, a vitamin, a supplement, or an illegal drug.  The effects of drug poisoning can be mild, dangerous, or even deadly.  This condition is diagnosed based on your symptoms and a physical exam. You will be asked to tell your health care provider which substances you took and when  you took them.  This condition may need to be treated right away at the hospital. This information is not intended to replace advice given to you by your health care provider. Make sure you discuss any questions you have with your health care provider. Document Revised: 07/15/2017 Document Reviewed: 07/04/2017 Elsevier Patient Education  2021 [employer].

## 2020-10-07 NOTE — Discharge Summary (Signed)
Physician Discharge Summary  Todd [name]. QAS:[id number] DOB: [birth year] DOA: 09/30/2020  PCP: [name], MD  Admit date: 09/30/2020 Discharge date: 10/07/2020 30 Day Unplanned Readmission Risk Score   Flowsheet Row ED to Hosp-Admission (Current) from 09/30/2020 in [facility]  30 Day Unplanned Readmission Risk Score (%) 30.01 Filed at 10/07/2020 0801     This score is the patient's risk of an unplanned readmission within 30 days of being discharged (0 -100%). The score is based on dignosis, age, lab data, medications, orders, and past utilization.   Low:  0-14.9   Medium: 15-21.9   High: 22-29.9   Extreme: 30 and above         Admitted From: SNF Disposition: Home  Recommendations for Outpatient Follow-up:  1. Follow up with PCP in 1-2 weeks 2. Please obtain BMP/CBC in one week 3. Please follow up with your PCP on the following pending results: Unresulted Labs (From admission, onward)         None        Home Health: None Equipment/Devices: Rolling walker  Discharge Condition: Stable CODE STATUS: Full code Diet recommendation: Cardiac  Subjective: Seen and examined.  No complaints other than chronic left knee pain which is improving.  Brief/Interim Summary: Todd Skoda. is a 65 y.o. male with PMH significant for DM2, HTN, multisubstance abuse including alcohol, tobacco, heroin CKD 3, DVT/PE, ulcerative esophagitis, hepatitis C, depression. Patient had a long hospitalization from 09/03/2020 to 09/29/2020 at [facility].  He was homeless prior to that, brought in unconscious from a shelter, intubated, subsequently extubated, found to be Covid positive.  He could not be discharged back to [facility] because of Covid test positivity.  He was hence sent to [facility], [place]. The very next day on 2/15 patient was brought to the ED from the rehab where he overdosed on heroine.  EMS noted periods of apnea and gave him IV Narcan in route to  the hospital which brought him back to alertness but it did not last long and hence patient was started on Narcan infusion in the ED.  With Narcan infusion, patient was awake and following commands appropriately.  He was admitted to ICU and gradually weaned down from Narcan infusion. Transferred out to hospitalist service on 2/17.  He was evaluated by PT OT and initially they had recommended discharging back to SNF however due to his history of drug abuse, no SNF had offered any bed to him.  Patient did not have any place to go.  Patient also complained of some left knee pain which was thought to be gout by previous hospitalist and he was started on colchicine.  However upon my examination, I did not think he had any gout.  This was his chronic arthritis.  He also came in with mild AKI on CKD stage II which resolved.  Rest of his medical issues remained stable.  He was subsequently seen by PT and they thought that patient is back to his baseline ability which is independent but he does need rolling walker.  They cleared the patient for discharge home.  We have been asking patient to call his family members or call multiple shelters to find a place for him but patient did not do any of those.  Reportedly, patient has several friends or family members who have been calling for him.  Patient tends to come up with excuses to prolong his hospitalization.  Now that patient has been cleared from  PT to be discharged home so he is going to be discharged today.  He has been told clearly by [facility] and myself that patient is responsible for finding a place for him to stay.  Discharge Diagnoses:  Active Problems:   Heroin overdose ([place])   Opiate overdose [facility])    Discharge Instructions   Allergies as of 10/07/2020   No Known Allergies     Medication List    STOP taking these medications   predniSONE 10 MG (21) Tbpk tablet Commonly known as: STERAPRED UNI-PAK 21 TAB   traMADol 50 MG tablet Commonly known  as: ULTRAM     TAKE these medications   acetaminophen 325 MG tablet Commonly known as: TYLENOL Take 2 tablets (650 mg total) by mouth every 8 (eight) hours as needed for mild pain, fever or headache.   ACID CONTROLLER PO Take 1 tablet by mouth daily as needed (reflux).   albuterol (2.5 MG/3ML) 0.083% nebulizer solution Commonly known as: PROVENTIL Take 3 mLs (2.5 mg total) by nebulization every 2 (two) hours as needed for wheezing.   amLODipine 5 MG tablet Commonly known as: NORVASC Take 1 tablet (5 mg total) by mouth daily.   aspirin EC 81 MG tablet Take 1 tablet (81 mg total) by mouth daily. Swallow whole.   clotrimazole 1 % cream Commonly known as: LOTRIMIN Apply topically 2 (two) times daily.   colchicine 0.6 MG tablet Take 1 tablet (0.6 mg total) by mouth daily.   dextromethorphan-guaiFENesin 30-600 MG 12hr tablet Commonly known as: MUCINEX DM Take 1 tablet by mouth 2 (two) times daily as needed for cough.   docusate sodium 100 MG capsule Commonly known as: COLACE Take 1 capsule (100 mg total) by mouth 2 (two) times daily.   folic acid 1 MG tablet Commonly known as: FOLVITE Take 1 tablet (1 mg total) by mouth daily.   metoprolol tartrate 25 MG tablet Commonly known as: LOPRESSOR Take 1 tablet (25 mg total) by mouth 2 (two) times daily.   multivitamin with minerals Tabs tablet Take 1 tablet by mouth daily.   pantoprazole 40 MG tablet Commonly known as: PROTONIX Take 1 tablet (40 mg total) by mouth at bedtime.   polyethylene glycol 17 g packet Commonly known as: MIRALAX / GLYCOLAX Take 17 g by mouth daily.   sertraline 25 MG tablet Commonly known as: ZOLOFT Take 1 tablet (25 mg total) by mouth daily.   sucralfate 1 GM/10ML suspension Commonly known as: CARAFATE Take 10 mLs (1 g total) by mouth 4 (four) times daily -  with meals and at bedtime.   thiamine 100 MG tablet Take 1 tablet (100 mg total) by mouth daily.            Durable Medical  Equipment  (From admission, onward)         Start     Ordered   10/06/20 1504  For home use only DME Walker rolling  Once       Question Answer Comment  Walker: With 5 Inch Wheels   Patient needs a walker to treat with the following condition Weakness      10/06/20 1503          Follow-up Information    [name], MD Follow up in 1 week(s).   Specialty: Internal Medicine Contact information: [address] [place] [address] [phone]              No Known Allergies  Consultations: None  Procedures/Studies: DG Chest 1 View  Result Date: 09/15/2020 CLINICAL DATA:  Fever. EXAM: CHEST  1 VIEW COMPARISON:  09/04/2020 FINDINGS: Bilateral patchy and nodular airspace disease is evident. No substantial pleural effusion. Cardiopericardial silhouette is at upper limits of normal for size. The visualized bony structures of the thorax show no acute abnormality. IMPRESSION: Bilateral patchy and nodular airspace disease compatible with multifocal pneumonia. Electronically Signed   By: Misty Stanley M.D.   On: 09/15/2020 14:19   DG Knee 1-2 Views Left  Result Date: 09/24/2020 CLINICAL DATA:  Chronic left knee pain without known injury. EXAM: LEFT KNEE - 1-2 VIEW COMPARISON:  None. FINDINGS: No evidence of fracture, dislocation, or joint effusion. Mild narrowing of medial and lateral joint spaces are noted with osteophyte formation. Soft tissues are unremarkable. IMPRESSION: Mild degenerative joint disease. No acute abnormality seen in the left knee. Electronically Signed   By: Marijo Conception M.D.   On: 09/24/2020 11:01   DG Chest Portable 1 View  Result Date: 10/01/2020 CLINICAL DATA:  Heroin overdose.  Shortness of breath. EXAM: PORTABLE CHEST 1 VIEW COMPARISON:  September 24, 2018 FINDINGS: Decreased lung volumes are seen which is likely, in part, secondary to the degree of patient inspiration. Mild atelectasis and/or infiltrate is noted within the right lung  base. There is no evidence of a pleural effusion or pneumothorax. The heart size and mediastinal contours are within normal limits. The visualized skeletal structures are unremarkable. IMPRESSION: Mild right basilar atelectasis and/or infiltrate. Electronically Signed   By: Virgina Norfolk M.D.   On: 10/01/2020 00:24   DG CHEST PORT 1 VIEW  Result Date: 09/24/2020 CLINICAL DATA:  Shortness of breath. EXAM: PORTABLE CHEST 1 VIEW COMPARISON:  September 18, 2020. FINDINGS: Stable cardiomediastinal silhouette. No pneumothorax or pleural effusion is noted. Stable patchy airspace opacities are noted consistent multifocal pneumonia. Bony thorax is unremarkable. IMPRESSION: Stable bilateral multifocal pneumonia. Electronically Signed   By: Marijo Conception M.D.   On: 09/24/2020 11:02   DG CHEST PORT 1 VIEW  Result Date: 09/18/2020 CLINICAL DATA:  Dyspnea EXAM: PORTABLE CHEST 1 VIEW COMPARISON:  Portable exam 0824 hours compared to 09/15/2020 FINDINGS: Normal heart size, mediastinal contours, and pulmonary vascularity. Persistent scattered BILATERAL pulmonary infiltrates consistent with multifocal pneumonia. No pleural effusion or pneumothorax. Osseous structures unremarkable. IMPRESSION: Patchy BILATERAL pulmonary infiltrates consistent with multifocal pneumonia, little changed. Electronically Signed   By: Lavonia Dana M.D.   On: 09/18/2020 08:44      Discharge Exam: Vitals:   10/06/20 2235 10/07/20 0631  BP: (!) 138/97 (!) 143/90  Pulse: 67 73  Resp: 18 18  Temp: 98.1 F (36.7 C) 98.3 F (36.8 C)  SpO2: 97% 95%   Vitals:   10/06/20 1451 10/06/20 2235 10/07/20 0631 10/07/20 0700  BP: 134/82 (!) 138/97 (!) 143/90   Pulse: 62 67 73   Resp: 15 18 18    Temp: 98.6 F (37 C) 98.1 F (36.7 C) 98.3 F (36.8 C)   TempSrc: Oral Oral Oral   SpO2: 96% 97% 95%   Weight:    81.3 kg  Height:        General: Pt is alert, awake, not in acute distress Cardiovascular: RRR, S1/S2 +, no rubs, no  gallops Respiratory: CTA bilaterally, no wheezing, no rhonchi Abdominal: Soft, NT, ND, bowel sounds + Extremities: no edema, no cyanosis    The results of significant diagnostics from this hospitalization (including imaging, microbiology, ancillary and laboratory) are listed below for reference.  Microbiology: Recent Results (from the past 240 hour(s))  SARS CORONAVIRUS 2 (TAT 6-24 HRS) Nasopharyngeal Nasopharyngeal Swab     Status: None   Collection Time: 10/01/20  1:18 AM   Specimen: Nasopharyngeal Swab  Result Value Ref Range Status   SARS Coronavirus 2 NEGATIVE NEGATIVE Final    Comment: (NOTE) SARS-CoV-2 target nucleic acids are NOT DETECTED.  The SARS-CoV-2 RNA is generally detectable in upper and lower respiratory specimens during the acute phase of infection. Negative results do not preclude SARS-CoV-2 infection, do not rule out co-infections with other pathogens, and should not be used as the sole basis for treatment or other patient management decisions. Negative results must be combined with clinical observations, patient history, and epidemiological information. The expected result is Negative.  Fact Sheet for Patients: SugarRoll.be  Fact Sheet for Healthcare Providers: https://www.woods-mathews.com/  This test is not yet approved or cleared by the [place] FDA and  has been authorized for detection and/or diagnosis of SARS-CoV-2 by FDA under an Emergency Use Authorization (EUA). This EUA will remain  in effect (meaning this test can be used) for the duration of the COVID-19 declaration under Se ction 564(b)(1) of the Act, 21 U.S.C. section 360bbb-3(b)(1), unless the authorization is terminated or revoked sooner.  Performed at [facility], [place] [address] [place], [place] [address]   MRSA PCR Screening     Status: None   Collection Time: 10/01/20  2:32 AM   Specimen: Nasopharyngeal  Result Value Ref  Range Status   MRSA by PCR NEGATIVE NEGATIVE Final    Comment:        The GeneXpert MRSA Assay (FDA approved for NASAL specimens only), is one component of a comprehensive MRSA colonization surveillance program. It is not intended to diagnose MRSA infection nor to guide or monitor treatment for MRSA infections. Performed at [facility], [place] [address] [place],  [address]      Labs: BNP (last 3 results) Recent Labs    09/08/20 0453 09/11/20 0658 09/14/20 0418  BNP 324.2* 67.0 86.7   Basic Metabolic Panel: Recent Labs  Lab 10/01/20 0249 10/01/20 1305 10/03/20 0503 10/04/20 0550 10/05/20 0602 10/06/20 0533  NA 142 137 144 140 139 139  K 5.2* 4.1 4.3 4.7 4.4 4.5  CL 108 104 109 105 104 105  CO2 21* 23 25 26 25 24   GLUCOSE 118* 114* 141* 92 117* 102*  BUN 42* 34* 22 17 24* 23  CREATININE 2.09* 1.52* 1.18 1.20 1.40* 1.09  CALCIUM 9.0 8.7* 9.1 9.3 9.6 9.5  MG 2.0  --   --   --   --   --   PHOS 7.3*  --   --   --   --   --    Liver Function Tests: Recent Labs  Lab 09/30/20 2120  AST 53*  ALT 56*  ALKPHOS 51  BILITOT 0.3  PROT 7.6  ALBUMIN 3.4*   No results for input(s): LIPASE, AMYLASE in the last 168 hours. No results for input(s): AMMONIA in the last 168 hours. CBC: Recent Labs  Lab 09/30/20 2120 10/01/20 0249 10/03/20 0503 10/04/20 0550 10/05/20 0602  WBC 16.1* 14.1* 6.6 4.7 5.0  NEUTROABS 11.7*  --  3.2 1.6* 2.2  HGB 13.2 12.0* 10.7* 11.2* 11.8*  HCT 44.4 39.5 34.8* 35.7* 37.6*  MCV 103.3* 102.3* 100.3* 98.9 98.4  PLT 333 308 202 217 228   Cardiac Enzymes: No results for input(s): CKTOTAL, CKMB, CKMBINDEX, TROPONINI in  the last 168 hours. BNP: Invalid input(s): POCBNP CBG: Recent Labs  Lab 10/06/20 0737 10/06/20 1150 10/06/20 1701 10/06/20 2233 10/07/20 0738  GLUCAP 96 179* 86 101* 101*   D-Dimer No results for input(s): DDIMER in the last 72 hours. Hgb A1c No results for input(s): HGBA1C in the last 72  hours. Lipid Profile No results for input(s): CHOL, HDL, LDLCALC, TRIG, CHOLHDL, LDLDIRECT in the last 72 hours. Thyroid function studies No results for input(s): TSH, T4TOTAL, T3FREE, THYROIDAB in the last 72 hours.  Invalid input(s): FREET3 Anemia work up No results for input(s): VITAMINB12, FOLATE, FERRITIN, TIBC, IRON, RETICCTPCT in the last 72 hours. Urinalysis    Component Value Date/Time   COLORURINE STRAW (A) 10/02/2020 0245   APPEARANCEUR CLEAR 10/02/2020 0245   LABSPEC 1.005 10/02/2020 0245   PHURINE 6.0 10/02/2020 0245   GLUCOSEU NEGATIVE 10/02/2020 0245   HGBUR NEGATIVE 10/02/2020 0245   BILIRUBINUR NEGATIVE 10/02/2020 0245   KETONESUR NEGATIVE 10/02/2020 0245   PROTEINUR NEGATIVE 10/02/2020 0245   UROBILINOGEN 0.2 12/24/2009 1944   NITRITE NEGATIVE 10/02/2020 0245   LEUKOCYTESUR NEGATIVE 10/02/2020 0245   Sepsis Labs Invalid input(s): PROCALCITONIN,  WBC,  LACTICIDVEN Microbiology Recent Results (from the past 240 hour(s))  SARS CORONAVIRUS 2 (TAT 6-24 HRS) Nasopharyngeal Nasopharyngeal Swab     Status: None   Collection Time: 10/01/20  1:18 AM   Specimen: Nasopharyngeal Swab  Result Value Ref Range Status   SARS Coronavirus 2 NEGATIVE NEGATIVE Final    Comment: (NOTE) SARS-CoV-2 target nucleic acids are NOT DETECTED.  The SARS-CoV-2 RNA is generally detectable in upper and lower respiratory specimens during the acute phase of infection. Negative results do not preclude SARS-CoV-2 infection, do not rule out co-infections with other pathogens, and should not be used as the sole basis for treatment or other patient management decisions. Negative results must be combined with clinical observations, patient history, and epidemiological information. The expected result is Negative.  Fact Sheet for Patients: SugarRoll.be  Fact Sheet for Healthcare Providers: https://www.woods-mathews.com/  This test is not yet approved  or cleared by the [place] FDA and  has been authorized for detection and/or diagnosis of SARS-CoV-2 by FDA under an Emergency Use Authorization (EUA). This EUA will remain  in effect (meaning this test can be used) for the duration of the COVID-19 declaration under Se ction 564(b)(1) of the Act, 21 U.S.C. section 360bbb-3(b)(1), unless the authorization is terminated or revoked sooner.  Performed at [facility], [place] [address], [place], [place] [address]   MRSA PCR Screening     Status: None   Collection Time: 10/01/20  2:32 AM   Specimen: Nasopharyngeal  Result Value Ref Range Status   MRSA by PCR NEGATIVE NEGATIVE Final    Comment:        The GeneXpert MRSA Assay (FDA approved for NASAL specimens only), is one component of a comprehensive MRSA colonization surveillance program. It is not intended to diagnose MRSA infection nor to guide or monitor treatment for MRSA infections. Performed at [facility], [place] [address], [place], [place] [address]      Time coordinating discharge: Over 30 minutes  SIGNED:   Darliss Cheney, MD  [facility] 10/07/2020, 9:58 AM  If 7PM-7AM, please contact night-coverage [email or web address]

## 2020-10-08 DIAGNOSIS — I491 Atrial premature depolarization: Secondary | ICD-10-CM | POA: Diagnosis not present

## 2020-10-08 DIAGNOSIS — F1010 Alcohol abuse, uncomplicated: Secondary | ICD-10-CM | POA: Diagnosis not present

## 2020-10-08 DIAGNOSIS — X58XXXA Exposure to other specified factors, initial encounter: Secondary | ICD-10-CM | POA: Diagnosis not present

## 2020-10-08 DIAGNOSIS — R69 Illness, unspecified: Secondary | ICD-10-CM | POA: Diagnosis not present

## 2020-10-08 DIAGNOSIS — I452 Bifascicular block: Secondary | ICD-10-CM | POA: Diagnosis not present

## 2020-10-08 DIAGNOSIS — Y999 Unspecified external cause status: Secondary | ICD-10-CM | POA: Diagnosis not present

## 2020-12-12 DIAGNOSIS — R404 Transient alteration of awareness: Secondary | ICD-10-CM | POA: Diagnosis not present

## 2020-12-12 DIAGNOSIS — R569 Unspecified convulsions: Secondary | ICD-10-CM | POA: Diagnosis not present

## 2020-12-12 DIAGNOSIS — R4020 Unspecified coma: Secondary | ICD-10-CM | POA: Diagnosis not present

## 2020-12-12 DIAGNOSIS — R0902 Hypoxemia: Secondary | ICD-10-CM | POA: Diagnosis not present

## 2020-12-12 DIAGNOSIS — R0602 Shortness of breath: Secondary | ICD-10-CM | POA: Diagnosis not present

## 2020-12-12 DIAGNOSIS — R000 Tachycardia, unspecified: Secondary | ICD-10-CM | POA: Diagnosis not present

## 2020-12-13 DIAGNOSIS — R0902 Hypoxemia: Secondary | ICD-10-CM | POA: Diagnosis not present

## 2020-12-13 DIAGNOSIS — T887XXA Unspecified adverse effect of drug or medicament, initial encounter: Secondary | ICD-10-CM | POA: Diagnosis not present

## 2020-12-13 DIAGNOSIS — R569 Unspecified convulsions: Secondary | ICD-10-CM | POA: Diagnosis not present

## 2020-12-13 DIAGNOSIS — T50904A Poisoning by unspecified drugs, medicaments and biological substances, undetermined, initial encounter: Secondary | ICD-10-CM | POA: Diagnosis not present

## 2020-12-13 DIAGNOSIS — R0689 Other abnormalities of breathing: Secondary | ICD-10-CM | POA: Diagnosis not present

## 2021-08-30 ENCOUNTER — Emergency Department (HOSPITAL_COMMUNITY): Payer: Medicare HMO

## 2021-08-30 ENCOUNTER — Inpatient Hospital Stay (HOSPITAL_COMMUNITY)
Admission: EM | Admit: 2021-08-30 | Discharge: 2021-09-04 | DRG: 308 | Disposition: A | Discharge status: Home / Self Care | Payer: Medicare HMO | Attending: Internal Medicine | Admitting: Internal Medicine

## 2021-08-30 ENCOUNTER — Other Ambulatory Visit: Payer: Self-pay

## 2021-08-30 DIAGNOSIS — Z86718 Personal history of other venous thrombosis and embolism: Secondary | ICD-10-CM

## 2021-08-30 DIAGNOSIS — J439 Emphysema, unspecified: Secondary | ICD-10-CM | POA: Diagnosis not present

## 2021-08-30 DIAGNOSIS — K219 Gastro-esophageal reflux disease without esophagitis: Secondary | ICD-10-CM | POA: Diagnosis present

## 2021-08-30 DIAGNOSIS — Z743 Need for continuous supervision: Secondary | ICD-10-CM | POA: Diagnosis not present

## 2021-08-30 DIAGNOSIS — I4891 Unspecified atrial fibrillation: Secondary | ICD-10-CM | POA: Diagnosis not present

## 2021-08-30 DIAGNOSIS — Z79899 Other long term (current) drug therapy: Secondary | ICD-10-CM

## 2021-08-30 DIAGNOSIS — R778 Other specified abnormalities of plasma proteins: Secondary | ICD-10-CM

## 2021-08-30 DIAGNOSIS — Z7982 Long term (current) use of aspirin: Secondary | ICD-10-CM

## 2021-08-30 DIAGNOSIS — N390 Urinary tract infection, site not specified: Secondary | ICD-10-CM

## 2021-08-30 DIAGNOSIS — M1711 Unilateral primary osteoarthritis, right knee: Secondary | ICD-10-CM | POA: Diagnosis not present

## 2021-08-30 DIAGNOSIS — Z20822 Contact with and (suspected) exposure to covid-19: Secondary | ICD-10-CM | POA: Diagnosis not present

## 2021-08-30 DIAGNOSIS — E861 Hypovolemia: Secondary | ICD-10-CM | POA: Diagnosis present

## 2021-08-30 DIAGNOSIS — F17210 Nicotine dependence, cigarettes, uncomplicated: Secondary | ICD-10-CM | POA: Diagnosis present

## 2021-08-30 DIAGNOSIS — R0902 Hypoxemia: Secondary | ICD-10-CM | POA: Diagnosis not present

## 2021-08-30 DIAGNOSIS — G4730 Sleep apnea, unspecified: Secondary | ICD-10-CM | POA: Diagnosis present

## 2021-08-30 DIAGNOSIS — E7800 Pure hypercholesterolemia, unspecified: Secondary | ICD-10-CM | POA: Diagnosis present

## 2021-08-30 DIAGNOSIS — S2239XA Fracture of one rib, unspecified side, initial encounter for closed fracture: Secondary | ICD-10-CM

## 2021-08-30 DIAGNOSIS — E872 Acidosis, unspecified: Secondary | ICD-10-CM | POA: Diagnosis not present

## 2021-08-30 DIAGNOSIS — E871 Hypo-osmolality and hyponatremia: Secondary | ICD-10-CM

## 2021-08-30 DIAGNOSIS — S2232XA Fracture of one rib, left side, initial encounter for closed fracture: Secondary | ICD-10-CM | POA: Diagnosis not present

## 2021-08-30 DIAGNOSIS — M19041 Primary osteoarthritis, right hand: Secondary | ICD-10-CM | POA: Diagnosis present

## 2021-08-30 DIAGNOSIS — R0602 Shortness of breath: Secondary | ICD-10-CM | POA: Diagnosis not present

## 2021-08-30 DIAGNOSIS — M1712 Unilateral primary osteoarthritis, left knee: Secondary | ICD-10-CM | POA: Diagnosis not present

## 2021-08-30 DIAGNOSIS — M19042 Primary osteoarthritis, left hand: Secondary | ICD-10-CM | POA: Diagnosis present

## 2021-08-30 DIAGNOSIS — Z8619 Personal history of other infectious and parasitic diseases: Secondary | ICD-10-CM

## 2021-08-30 DIAGNOSIS — M25569 Pain in unspecified knee: Secondary | ICD-10-CM

## 2021-08-30 DIAGNOSIS — Z5901 Sheltered homelessness: Secondary | ICD-10-CM | POA: Diagnosis not present

## 2021-08-30 DIAGNOSIS — I484 Atypical atrial flutter: Secondary | ICD-10-CM

## 2021-08-30 DIAGNOSIS — Z8042 Family history of malignant neoplasm of prostate: Secondary | ICD-10-CM

## 2021-08-30 DIAGNOSIS — W19XXXA Unspecified fall, initial encounter: Secondary | ICD-10-CM | POA: Diagnosis not present

## 2021-08-30 DIAGNOSIS — E119 Type 2 diabetes mellitus without complications: Secondary | ICD-10-CM | POA: Diagnosis present

## 2021-08-30 DIAGNOSIS — F1010 Alcohol abuse, uncomplicated: Secondary | ICD-10-CM | POA: Diagnosis present

## 2021-08-30 DIAGNOSIS — S22000A Wedge compression fracture of unspecified thoracic vertebra, initial encounter for closed fracture: Secondary | ICD-10-CM | POA: Diagnosis not present

## 2021-08-30 DIAGNOSIS — R000 Tachycardia, unspecified: Secondary | ICD-10-CM | POA: Diagnosis not present

## 2021-08-30 DIAGNOSIS — F32A Depression, unspecified: Secondary | ICD-10-CM | POA: Diagnosis present

## 2021-08-30 DIAGNOSIS — I4892 Unspecified atrial flutter: Secondary | ICD-10-CM | POA: Diagnosis not present

## 2021-08-30 DIAGNOSIS — R0789 Other chest pain: Secondary | ICD-10-CM | POA: Diagnosis not present

## 2021-08-30 DIAGNOSIS — J189 Pneumonia, unspecified organism: Secondary | ICD-10-CM

## 2021-08-30 DIAGNOSIS — F1410 Cocaine abuse, uncomplicated: Secondary | ICD-10-CM | POA: Diagnosis present

## 2021-08-30 DIAGNOSIS — A419 Sepsis, unspecified organism: Secondary | ICD-10-CM | POA: Diagnosis not present

## 2021-08-30 DIAGNOSIS — I10 Essential (primary) hypertension: Secondary | ICD-10-CM | POA: Diagnosis present

## 2021-08-30 DIAGNOSIS — E860 Dehydration: Secondary | ICD-10-CM | POA: Diagnosis present

## 2021-08-30 DIAGNOSIS — I959 Hypotension, unspecified: Secondary | ICD-10-CM | POA: Diagnosis present

## 2021-08-30 DIAGNOSIS — N179 Acute kidney failure, unspecified: Secondary | ICD-10-CM | POA: Diagnosis not present

## 2021-08-30 DIAGNOSIS — Z8546 Personal history of malignant neoplasm of prostate: Secondary | ICD-10-CM

## 2021-08-30 DIAGNOSIS — R0689 Other abnormalities of breathing: Secondary | ICD-10-CM | POA: Diagnosis not present

## 2021-08-30 DIAGNOSIS — R69 Illness, unspecified: Secondary | ICD-10-CM | POA: Diagnosis not present

## 2021-08-30 DIAGNOSIS — Z86711 Personal history of pulmonary embolism: Secondary | ICD-10-CM

## 2021-08-30 DIAGNOSIS — R911 Solitary pulmonary nodule: Secondary | ICD-10-CM | POA: Diagnosis not present

## 2021-08-30 DIAGNOSIS — R109 Unspecified abdominal pain: Secondary | ICD-10-CM | POA: Diagnosis not present

## 2021-08-30 DIAGNOSIS — S3991XA Unspecified injury of abdomen, initial encounter: Secondary | ICD-10-CM | POA: Diagnosis not present

## 2021-08-30 DIAGNOSIS — F1490 Cocaine use, unspecified, uncomplicated: Secondary | ICD-10-CM

## 2021-08-30 DIAGNOSIS — R079 Chest pain, unspecified: Secondary | ICD-10-CM | POA: Diagnosis not present

## 2021-08-30 DIAGNOSIS — I452 Bifascicular block: Secondary | ICD-10-CM | POA: Diagnosis present

## 2021-08-30 DIAGNOSIS — M4854XA Collapsed vertebra, not elsewhere classified, thoracic region, initial encounter for fracture: Secondary | ICD-10-CM | POA: Diagnosis not present

## 2021-08-30 DIAGNOSIS — Z9079 Acquired absence of other genital organ(s): Secondary | ICD-10-CM

## 2021-08-30 LAB — LACTIC ACID, PLASMA
Lactic Acid, Venous: >9 mmol/L (ref 0.5–1.9)
Lactic Acid, Venous: 1.3 mmol/L (ref 0.5–1.9)

## 2021-08-30 LAB — I-STAT VENOUS BLOOD GAS, ED
Acid-base deficit: 7 mmol/L — ABNORMAL HIGH (ref 0.0–2.0)
Bicarbonate: 18.6 mmol/L — ABNORMAL LOW (ref 20.0–28.0)
Calcium, Ion: 1.09 mmol/L — ABNORMAL LOW (ref 1.15–1.40)
HCT: 39 % (ref 39.0–52.0)
Hemoglobin: 13.3 g/dL (ref 13.0–17.0)
O2 Saturation: 78 %
Potassium: 4.7 mmol/L (ref 3.5–5.1)
Sodium: 129 mmol/L — ABNORMAL LOW (ref 135–145)
TCO2: 20 mmol/L — ABNORMAL LOW (ref 22–32)
pCO2, Ven: 36.2 mmHg — ABNORMAL LOW (ref 44.0–60.0)
pH, Ven: 7.318 (ref 7.250–7.430)
pO2, Ven: 45 mmHg (ref 32.0–45.0)

## 2021-08-30 LAB — CBC WITH DIFFERENTIAL/PLATELET
Abs Immature Granulocytes: 0.06 10*3/uL (ref 0.00–0.07)
Basophils Absolute: 0 10*3/uL (ref 0.0–0.1)
Basophils Relative: 0 %
Eosinophils Absolute: 0 10*3/uL (ref 0.0–0.5)
Eosinophils Relative: 0 %
HCT: 33.4 % — ABNORMAL LOW (ref 39.0–52.0)
Hemoglobin: 11.3 g/dL — ABNORMAL LOW (ref 13.0–17.0)
Immature Granulocytes: 1 %
Lymphocytes Relative: 9 %
Lymphs Abs: 0.9 10*3/uL (ref 0.7–4.0)
MCH: 32.5 pg (ref 26.0–34.0)
MCHC: 33.8 g/dL (ref 30.0–36.0)
MCV: 96 fL (ref 80.0–100.0)
Monocytes Absolute: 0.9 10*3/uL (ref 0.1–1.0)
Monocytes Relative: 10 %
Neutro Abs: 7.5 10*3/uL (ref 1.7–7.7)
Neutrophils Relative %: 80 %
Platelets: 127 10*3/uL — ABNORMAL LOW (ref 150–400)
RBC: 3.48 MIL/uL — ABNORMAL LOW (ref 4.22–5.81)
RDW: 12.7 % (ref 11.5–15.5)
WBC: 9.4 10*3/uL (ref 4.0–10.5)
nRBC: 0 % (ref 0.0–0.2)

## 2021-08-30 LAB — COMPREHENSIVE METABOLIC PANEL
ALT: 9 U/L (ref 0–44)
AST: 13 U/L — ABNORMAL LOW (ref 15–41)
Albumin: 1.6 g/dL — ABNORMAL LOW (ref 3.5–5.0)
Alkaline Phosphatase: 30 U/L — ABNORMAL LOW (ref 38–126)
Anion gap: 14 (ref 5–15)
BUN: 26 mg/dL — ABNORMAL HIGH (ref 8–23)
CO2: 13 mmol/L — ABNORMAL LOW (ref 22–32)
Calcium: 7.3 mg/dL — ABNORMAL LOW (ref 8.9–10.3)
Chloride: 100 mmol/L (ref 98–111)
Creatinine, Ser: 1.35 mg/dL — ABNORMAL HIGH (ref 0.61–1.24)
GFR, Estimated: 58 mL/min — ABNORMAL LOW (ref >60)
Glucose, Bld: 98 mg/dL (ref 70–99)
Potassium: 4.8 mmol/L (ref 3.5–5.1)
Sodium: 127 mmol/L — ABNORMAL LOW (ref 135–145)
Total Bilirubin: 0.8 mg/dL (ref 0.3–1.2)
Total Protein: 4 g/dL — ABNORMAL LOW (ref 6.5–8.1)

## 2021-08-30 LAB — RESP PANEL BY RT-PCR (FLU A&B, COVID) ARPGX2
Influenza A by PCR: NEGATIVE
Influenza B by PCR: NEGATIVE
SARS Coronavirus 2 by RT PCR: NEGATIVE

## 2021-08-30 LAB — LIPASE, BLOOD: Lipase: 32 U/L (ref 11–51)

## 2021-08-30 LAB — RAPID URINE DRUG SCREEN, HOSP PERFORMED
Amphetamines: NOT DETECTED
Barbiturates: NOT DETECTED
Benzodiazepines: NOT DETECTED
Cocaine: POSITIVE — AB
Opiates: NOT DETECTED
Tetrahydrocannabinol: NOT DETECTED

## 2021-08-30 LAB — URINALYSIS, ROUTINE W REFLEX MICROSCOPIC
Bilirubin Urine: NEGATIVE
Glucose, UA: NEGATIVE mg/dL
Ketones, ur: NEGATIVE mg/dL
Nitrite: POSITIVE — AB
Protein, ur: 100 mg/dL — AB
Specific Gravity, Urine: 1.02 (ref 1.005–1.030)
pH: 6 (ref 5.0–8.0)

## 2021-08-30 LAB — URINALYSIS, MICROSCOPIC (REFLEX): WBC, UA: >50 WBC/hpf (ref 0–5)

## 2021-08-30 LAB — ETHANOL: Alcohol, Ethyl (B): <10 mg/dL (ref <10)

## 2021-08-30 LAB — TROPONIN I (HIGH SENSITIVITY)
Troponin I (High Sensitivity): 36 ng/L — ABNORMAL HIGH (ref <18)
Troponin I (High Sensitivity): 59 ng/L — ABNORMAL HIGH (ref <18)

## 2021-08-30 MED ORDER — AMIODARONE HCL IN DEXTROSE 360-4.14 MG/200ML-% IV SOLN
60.0000 mg/h | INTRAVENOUS | Status: AC
  Administered 2021-08-31: 60 mg/h via INTRAVENOUS
  Filled 2021-08-30 (×2): qty 200

## 2021-08-30 MED ORDER — SODIUM CHLORIDE 0.9 % IV BOLUS: 500.0000 mL | Freq: Once | INTRAVENOUS | Status: DC

## 2021-08-30 MED ORDER — ONDANSETRON HCL 4 MG/2ML IJ SOLN
4.0000 mg | Freq: Once | INTRAMUSCULAR | Status: AC
  Administered 2021-08-30: 4 mg via INTRAVENOUS
  Filled 2021-08-30: qty 2

## 2021-08-30 MED ORDER — SODIUM CHLORIDE 0.9 % IV SOLN
500.0000 mg | INTRAVENOUS | Status: DC
  Administered 2021-08-31 – 2021-09-01 (×3): 500 mg via INTRAVENOUS
  Filled 2021-08-30 (×3): qty 5

## 2021-08-30 MED ORDER — LACTATED RINGERS IV BOLUS
1000.0000 mL | Freq: Once | INTRAVENOUS | Status: AC
Start: 2021-08-30 — End: 2021-08-30
  Administered 2021-08-30: 1000 mL via INTRAVENOUS

## 2021-08-30 MED ORDER — AMIODARONE LOAD VIA INFUSION
150.0000 mg | Freq: Once | INTRAVENOUS | Status: AC
  Administered 2021-08-31: 150 mg via INTRAVENOUS
  Filled 2021-08-30: qty 83.34

## 2021-08-30 MED ORDER — SODIUM CHLORIDE 0.9 % IV SOLN
2.0000 g | INTRAVENOUS | Status: DC
  Administered 2021-08-30 – 2021-09-01 (×3): 2 g via INTRAVENOUS
  Filled 2021-08-30 (×4): qty 20

## 2021-08-30 MED ORDER — AMIODARONE HCL IN DEXTROSE 360-4.14 MG/200ML-% IV SOLN
30.0000 mg/h | INTRAVENOUS | Status: DC
  Administered 2021-08-31 – 2021-09-01 (×4): 30 mg/h via INTRAVENOUS
  Filled 2021-08-30 (×3): qty 200

## 2021-08-30 MED ORDER — IOHEXOL 300 MG/ML  SOLN
80.0000 mL | Freq: Once | INTRAMUSCULAR | Status: AC | PRN
  Administered 2021-08-30: 80 mL via INTRAVENOUS

## 2021-08-30 MED ORDER — HEPARIN BOLUS VIA INFUSION
4000.0000 [IU] | Freq: Once | INTRAVENOUS | Status: AC
  Administered 2021-08-30: 4000 [IU] via INTRAVENOUS
  Filled 2021-08-30: qty 4000

## 2021-08-30 MED ORDER — IOHEXOL 350 MG/ML SOLN
100.0000 mL | Freq: Once | INTRAVENOUS | Status: AC | PRN
  Administered 2021-08-30: 100 mL via INTRAVENOUS

## 2021-08-30 MED ORDER — PIPERACILLIN-TAZOBACTAM 3.375 G IVPB 30 MIN
3.3750 g | Freq: Once | INTRAVENOUS | Status: AC
Start: 2021-08-30 — End: 2021-08-30
  Administered 2021-08-30: 3.375 g via INTRAVENOUS
  Filled 2021-08-30: qty 50

## 2021-08-30 MED ORDER — HYDROCODONE-ACETAMINOPHEN 5-325 MG PO TABS
1.0000 | ORAL_TABLET | Freq: Four times a day (QID) | ORAL | Status: DC | PRN
  Administered 2021-08-31 – 2021-09-03 (×6): 1 via ORAL
  Filled 2021-08-30 (×7): qty 1

## 2021-08-30 MED ORDER — HEPARIN (PORCINE) 25000 UT/250ML-% IV SOLN
2000.0000 [IU]/h | INTRAVENOUS | Status: DC
  Administered 2021-08-30: 22:00:00 1400 [IU]/h via INTRAVENOUS
  Administered 2021-08-31: 12:00:00 1600 [IU]/h via INTRAVENOUS
  Administered 2021-09-01: 1900 [IU]/h via INTRAVENOUS
  Administered 2021-09-01: 2000 [IU]/h via INTRAVENOUS
  Filled 2021-08-30 (×4): qty 250

## 2021-08-30 MED ORDER — SODIUM CHLORIDE 0.9 % IV SOLN
1.0000 g | Freq: Once | INTRAVENOUS | Status: DC
  Filled 2021-08-30: qty 10

## 2021-08-30 MED ORDER — ACETAMINOPHEN 325 MG PO TABS
650.0000 mg | ORAL_TABLET | Freq: Four times a day (QID) | ORAL | Status: DC | PRN
  Administered 2021-08-31: 650 mg via ORAL
  Filled 2021-08-30: qty 2

## 2021-08-30 MED ORDER — HYDROMORPHONE HCL 1 MG/ML IJ SOLN
1.0000 mg | Freq: Once | INTRAMUSCULAR | Status: AC
Start: 2021-08-30 — End: 2021-08-30
  Administered 2021-08-30: 1 mg via INTRAVENOUS
  Filled 2021-08-30: qty 1

## 2021-08-30 MED ORDER — IPRATROPIUM BROMIDE 0.02 % IN SOLN: 0.5000 mg | Freq: Four times a day (QID) | RESPIRATORY_TRACT | Status: DC | PRN

## 2021-08-30 MED ORDER — LACTATED RINGERS IV BOLUS
1000.0000 mL | Freq: Once | INTRAVENOUS | Status: AC
  Administered 2021-08-30: 1000 mL via INTRAVENOUS

## 2021-08-30 MED ORDER — LORAZEPAM 2 MG/ML IJ SOLN
2.0000 mg | Freq: Once | INTRAMUSCULAR | Status: AC
Start: 2021-08-30 — End: 2021-08-30
  Administered 2021-08-30: 2 mg via INTRAVENOUS
  Filled 2021-08-30: qty 1

## 2021-08-30 MED ORDER — SODIUM CHLORIDE 0.9 % IV BOLUS
500.0000 mL | Freq: Once | INTRAVENOUS | Status: AC
  Administered 2021-08-31: 500 mL via INTRAVENOUS

## 2021-08-30 MED ORDER — HYDROMORPHONE HCL 1 MG/ML IJ SOLN
0.5000 mg | Freq: Once | INTRAMUSCULAR | Status: AC
  Administered 2021-08-30: 0.5 mg via INTRAVENOUS
  Filled 2021-08-30: qty 1

## 2021-08-30 MED ORDER — VERAPAMIL HCL 80 MG PO TABS
80.0000 mg | ORAL_TABLET | Freq: Once | ORAL | Status: DC
  Filled 2021-08-30: qty 1

## 2021-08-30 MED ORDER — LEVALBUTEROL HCL 1.25 MG/0.5ML IN NEBU
1.2500 mg | INHALATION_SOLUTION | Freq: Four times a day (QID) | RESPIRATORY_TRACT | Status: DC | PRN
  Filled 2021-08-30: qty 0.5

## 2021-08-30 MED ORDER — HYDROMORPHONE HCL 1 MG/ML IJ SOLN
1.0000 mg | Freq: Once | INTRAMUSCULAR | Status: AC
  Administered 2021-08-30: 1 mg via INTRAVENOUS
  Filled 2021-08-30: qty 1

## 2021-08-30 MED ORDER — LIDOCAINE 5 % EX PTCH
1.0000 | MEDICATED_PATCH | CUTANEOUS | Status: DC
  Administered 2021-08-30 – 2021-09-03 (×5): 1 via TRANSDERMAL
  Filled 2021-08-30 (×5): qty 1

## 2021-08-30 NOTE — ED Provider Notes (Signed)
[facility] EMERGENCY DEPARTMENT Provider Note   CSN: [id number] Arrival date & time: 08/30/21  1146     History  No chief complaint on file.   Per Todd [name]. is a 66 y.o. male.  Patient is a 66 year old male with a history of diabetes, PE/DVT, hypertension, prior opiate abuse who is presenting today with EMS with a complaint of cough, left-sided chest pain, shortness of breath.  Patient reports that he has been feeling unwell for the last 10 days and is just gradually been getting worse.  It started as some pain in the left rib cage which is worse with movement, lifting his arm and taking a deep breath.  It is gradually become worse he is now coughing up some white sputum, for the last 2 to 3 days he reports he has not been able to eat anything because he just has not been hungry.  He has not had any nausea vomiting or diarrhea.  He denies any abdominal pain.  He denies taking any anticoagulation at this time.  He has not noticed any pain in his legs or swelling.  EMS reported upon their arrival patient has been persistently tachycardic and tachypneic.  He was placed on 2 L of oxygen due to his tachypnea but did not have any hypoxia.  Patient reports he has not been taking anything for pain denies any recent opiate use.  He does not use any inhalers at home regularly but does admit to smoking cigarettes.  The history is provided by the patient and the EMS personnel.      Home Medications Prior to Admission medications   Medication Sig Start Date End Date Taking? Authorizing Provider  acetaminophen (TYLENOL) 325 MG tablet Take 2 tablets (650 mg total) by mouth every 8 (eight) hours as needed for mild pain, fever or headache. 09/29/20   Raiford Noble Latif, DO  albuterol (PROVENTIL) (2.5 MG/3ML) 0.083% nebulizer solution Take 3 mLs (2.5 mg total) by nebulization every 2 (two) hours as needed for wheezing. 09/29/20   Raiford Noble Latif, DO  amLODipine (NORVASC) 5 MG  tablet Take 1 tablet (5 mg total) by mouth daily. 09/30/20   Raiford Noble Latif, DO  aspirin EC 81 MG tablet Take 1 tablet (81 mg total) by mouth daily. Swallow whole. 09/29/20   Raiford Noble Latif, DO  clotrimazole (LOTRIMIN) 1 % cream Apply topically 2 (two) times daily. 09/29/20   Raiford Noble Latif, DO  colchicine 0.6 MG tablet Take 1 tablet (0.6 mg total) by mouth daily. 09/29/20   Raiford Noble Latif, DO  dextromethorphan-guaiFENesin [facility] DM) 30-600 MG 12hr tablet Take 1 tablet by mouth 2 (two) times daily as needed for cough. 09/29/20   Raiford Noble Latif, DO  docusate sodium (COLACE) 100 MG capsule Take 1 capsule (100 mg total) by mouth 2 (two) times daily. 09/29/20   Raiford Noble Latif, DO  Famotidine (ACID CONTROLLER PO) Take 1 tablet by mouth daily as needed (reflux).    [provider]  folic acid (FOLVITE) 1 MG tablet Take 1 tablet (1 mg total) by mouth daily. 09/30/20   Raiford Noble Latif, DO  metoprolol tartrate (LOPRESSOR) 25 MG tablet Take 1 tablet (25 mg total) by mouth 2 (two) times daily. 09/29/20   Raiford Noble Latif, DO  Multiple Vitamin (MULTIVITAMIN WITH MINERALS) TABS tablet Take 1 tablet by mouth daily. 09/30/20   Raiford Noble Latif, DO  pantoprazole (PROTONIX) 40 MG tablet Take 1 tablet (40 mg total) by mouth  at bedtime. 09/29/20   Sheikh, Georgina Quint Latif, DO  polyethylene glycol (MIRALAX / GLYCOLAX) 17 g packet Take 17 g by mouth daily. 09/30/20   Raiford Noble Latif, DO  sertraline (ZOLOFT) 25 MG tablet Take 1 tablet (25 mg total) by mouth daily. 09/29/20   Raiford Noble Latif, DO  sucralfate (CARAFATE) 1 GM/10ML suspension Take 10 mLs (1 g total) by mouth 4 (four) times daily -  with meals and at bedtime. 09/29/20 10/29/20  Raiford Noble Latif, DO  thiamine 100 MG tablet Take 1 tablet (100 mg total) by mouth daily. 09/30/20   Kerney Elbe, DO      Allergies    Patient has no known allergies.    Review of Systems   Review of Systems  Physical Exam Updated  Vital Signs BP 113/90    Pulse (!) 153    Temp 98.6 F (37 C) (Oral)    Resp 19    Ht 5\' 9"  (1.753 m)    Wt 99.8 kg    SpO2 96%    BMI 32.49 kg/m  Physical Exam Vitals and nursing note reviewed.  Constitutional:      General: He is in acute distress.     Appearance: He is well-developed.     Comments: Patient is moaning and rolling around in the bed but is able to give a history  HENT:     Head: Normocephalic and atraumatic.     Mouth/Throat:     Mouth: Mucous membranes are dry.  Eyes:     Conjunctiva/sclera: Conjunctivae normal.     Pupils: Pupils are equal, round, and reactive to light.  Cardiovascular:     Rate and Rhythm: Regular rhythm. Tachycardia present.     Pulses: Normal pulses.     Heart sounds: No murmur heard. Pulmonary:     Effort: Pulmonary effort is normal. Tachypnea present. No respiratory distress.     Breath sounds: Rhonchi present. No wheezing or rales.  Chest:     Chest wall: Tenderness present.  Abdominal:     General: There is no distension.     Palpations: Abdomen is soft.     Tenderness: There is abdominal tenderness in the left upper quadrant. There is no right CVA tenderness, left CVA tenderness, guarding or rebound.  Musculoskeletal:        General: No tenderness. Normal range of motion.     Cervical back: Normal range of motion and neck supple.     Right lower leg: No edema.     Left lower leg: No edema.  Skin:    General: Skin is warm and dry.     Findings: No erythema or rash.  Neurological:     Mental Status: He is alert and oriented to person, place, and time. Mental status is at baseline.  Psychiatric:        Behavior: Behavior normal.     Comments: anxious    ED Results / Procedures / Treatments   Labs (all labs ordered are listed, but only abnormal results are displayed) Labs Reviewed  CBC WITH DIFFERENTIAL/PLATELET - Abnormal; Notable for the following components:      Result Value   RBC 3.48 (*)    Hemoglobin 11.3 (*)    HCT  33.4 (*)    Platelets 127 (*)    All other components within normal limits  COMPREHENSIVE METABOLIC PANEL - Abnormal; Notable for the following components:   Sodium 127 (*)    CO2 13 (*)  BUN 26 (*)    Creatinine, Ser 1.35 (*)    Calcium 7.3 (*)    Total Protein 4.0 (*)    Albumin 1.6 (*)    AST 13 (*)    Alkaline Phosphatase 30 (*)    GFR, Estimated 58 (*)    All other components within normal limits  LACTIC ACID, PLASMA - Abnormal; Notable for the following components:   Lactic Acid, Venous >9.0 (*)    All other components within normal limits  TROPONIN I (HIGH SENSITIVITY) - Abnormal; Notable for the following components:   Troponin I (High Sensitivity) 36 (*)    All other components within normal limits  LIPASE, BLOOD  URINALYSIS, ROUTINE W REFLEX MICROSCOPIC  TROPONIN I (HIGH SENSITIVITY)    EKG EKG Interpretation  Date/Time:  Sunday August 30 2021 13:33:24 EST Ventricular Rate:  155 PR Interval:  81 QRS Duration: 130 QT Interval:  289 QTC Calculation: 465 R Axis:   -83 Text Interpretation: Sinus tachycardia RBBB and LAFB Confirmed by Blanchie Dessert ([id number]) on 08/30/2021 1:43:15 PM  Radiology DG Chest Port 1 View  Result Date: 08/30/2021 CLINICAL DATA:  Short of breath.  Left-sided chest wall pain. EXAM: PORTABLE CHEST 1 VIEW COMPARISON:  10/07/2020. FINDINGS: Cardiac silhouette is normal in size. No mediastinal or hilar masses. Chronic interstitial thickening, most evident in the left mid and lower lung, stable. No evidence of pneumonia or pulmonary edema. No convincing pleural effusion and no pneumothorax. Skeletal structures are grossly intact. IMPRESSION: No acute cardiopulmonary disease. Electronically Signed   By: Lajean Manes M.D.   On: 08/30/2021 12:21    Procedures Procedures    Medications Ordered in ED Medications  lactated ringers bolus 1,000 mL (has no administration in time range)  HYDROmorphone (DILAUDID) injection 1 mg (1 mg Intravenous  Given 08/30/21 1201)  lactated ringers bolus 1,000 mL (1,000 mLs Intravenous New Bag/Given 08/30/21 1205)  ondansetron (ZOFRAN) injection 4 mg (4 mg Intravenous Given 08/30/21 1203)  HYDROmorphone (DILAUDID) injection 1 mg (1 mg Intravenous Given 08/30/21 1355)    ED Course/ Medical Decision Making/ A&P                           Medical Decision Making  Patient is a 66 year old male with multiple medical problems today who significant pain in the left side of his chest, shortness of breath and tachycardia.  Patient has no focal abdominal findings except for some minimal left upper quadrant tenderness which seems to be more related to the lungs.  He has no flank pain or urinary complaints.  At this time high suspicion for PE given prior history and no longer anticoagulated versus MI versus pneumonia given abnormal lung sounds and productive cough versus COVID versus an acute abdominal process such as a kidney stone or atypical diverticulitis or hepatitis.  Patient was given IV fluids as he does appear dehydrated and concern as he has not had anything to eat or drink for the last few days.  He was given pain control antiemetics.  Labs and imaging are pending.  Based on external medical records patient was last seen approximately 1 year ago and at that time had an accidental opiate overdose.  He denies taking any opiates today he does not have fresh track marks.  2:05 PM I independently interpreted patient's labs today and lipase is within normal limits, CMP with mild AKI with creatinine of 1.35 from baseline of less than 1, mild hyponatremia today  of 127 and CO2 of 13 but normal anion gap of 14, CBC today with stable hemoglobin of 11 and normal white count, troponin mildly elevated at 36 unclear what patient's baseline is.  I independently interpreted patient's chest x-ray and there are no acute findings today.  Also interpreted his EKG which he has a known history of a right bundle branch block and has  sinus tachycardia today with a right bundle branch block with rates at 155.  Patient's lactic acid read greater than 9 however with no evidence of an anion gap on CMP concerned that the sample may have been hemolyzed.  He was given 2 L of fluid total.  Repeat lactate pending.  CTA to rule out PE pending.        Final Clinical Impression(s) / ED Diagnoses Final diagnoses:  None    Rx / DC Orders ED Discharge Orders     None         Blanchie Dessert, MD 08/30/21 1529

## 2021-08-30 NOTE — ED Notes (Signed)
MD [place] made aware of pt's recent BP and told this RN to validate the pressures and to continue monitoring them. LR bolus still infusing.

## 2021-08-30 NOTE — ED Notes (Signed)
Patient transported to CT 

## 2021-08-30 NOTE — ED Provider Notes (Signed)
Patient signed out to me.  Patient is episodes of tachycardic appear intermittent.  Discussed with cardiology, agree that appears more consistent with atrial flutter.  Recommending verapamil.  Vital signs appear stabilized although he did have a few episodes of hypotension does appear resolved.  Patient to be admitted to the hospitalist team.   Luna Fuse, MD 08/30/21 2001

## 2021-08-30 NOTE — H&P (Addendum)
History and Physical    Todd [name]. VOJ:[id number] DOB: October 13, 1955 DOA: 08/30/2021  PCP: [name], MD  Patient coming from: Home  I have personally briefly reviewed patient's old medical records in [place]  Chief Complaint: shortness of breath   HPI: Todd [name]. is a 66 y.o. male with medical history significant for hypertension, PE/DVT, type 2 diabetes, polysubstance abuse who presents with concerns of increasing shortness of breath.  Patient is a limited historian and his speech is likely from lack of dentition.  He was mostly lying in bed grunting complaining of left-sided crampy chest pain.  Reports he has been feeling progressively worsening shortness of breath for least a month.  Denies any nausea, vomiting or abdominal pain.  Denies any dysuria. Reports cocaine use 2 days ago. No IV drug use.   ED Course: He was afebrile and was intermittently tachycardic up to 150 and also tachypneic without hypoxia requiring 2 L via nasal cannula. UDS was positive for cocaine.  Patient initially given IV 2 mg of Ativan for rate control in the ED but became hypotensive down to systolic 99B.  EKG unchanged from prior showing known RBBB and LAFB.  Troponin at 36.  ED physician Dr. Almyra Free consulted cardiology Dr. Zigmund Daniel who believes patient is going into intermittent atrial fibrillation with 2-1 block and recommend giving a dose of verapamil.  CBC showed no leukocytosis, mild anemia with hemoglobin of 11.3.  Hyponatremic at 127, BG of 98, creatinine at 1.35 from a prior of 1.09.  No anion gap.  Initial lactate of greater than 9 but with repeat at 1.3.  UA with positive nitrate, large leukocyte, protein   CTA chest was negative for PE, had mildly displaced fracture of the left third rib, new superior endplate compression fracture of T4 and T5, there is also pronounced left-sided groundglass opacity.  CT abdomen pelvis negative for acute findings.   Review of  Systems: Pertinent positive negatives above as he is limited historian.  Social Pt is homeless and currently living out of a hotel. Not taking any of his prescribed medications. Denies tobacco and alcohol. Reports smoking cocaine.     Past Medical History:  Diagnosis Date   Arthritis    "fingers" (07/14/2018)   Depression    DVT (deep venous thrombosis) (HCC) LLE   Hepatitis C     finished harvoni tx ~ 2017   Hypercholesterolemia    Hypertension    Prostate cancer ([place]) 88yrs ago   Pulmonary embolism and infarction ([place]) 07/13/2018   Sleep apnea    not currently using cpap, mask causing vertigo   Type II diabetes mellitus ([place])     Past Surgical History:  Procedure Laterality Date   BIOPSY  01/12/2019   Procedure: BIOPSY;  Surgeon: Ronald Lobo, MD;  Location: [facility] ENDOSCOPY;  Service: Endoscopy;;   BIOPSY  06/23/2019   Procedure: BIOPSY;  Surgeon: Arta Silence, MD;  Location: [place];  Service: Endoscopy;;   COLONOSCOPY WITH PROPOFOL N/A 02/19/2014   Procedure: COLONOSCOPY WITH PROPOFOL;  Surgeon: Garlan Fair, MD;  Location: [facility] ENDOSCOPY;  Service: Endoscopy;  Laterality: N/A;   ENDOVENOUS ABLATION SAPHENOUS VEIN W/ LASER Left 11/22/2017   endovenous laser ablation L SSV by Tinnie Gens MD    ESOPHAGEAL BRUSHING  06/23/2019   Procedure: ESOPHAGEAL BRUSHING;  Surgeon: Arta Silence, MD;  Location: [place];  Service: Endoscopy;;   ESOPHAGOGASTRODUODENOSCOPY (EGD) WITH PROPOFOL N/A 01/12/2019   Procedure: ESOPHAGOGASTRODUODENOSCOPY (EGD) WITH PROPOFOL;  Surgeon: Buccini,  Herbie Baltimore, MD;  Location: [place];  Service: Endoscopy;  Laterality: N/A;  Patient is also scheduled for barium swallow; please notify radiology after patient's EGD is complete so that barium swallow follows the endoscopy, not vice versa   ESOPHAGOGASTRODUODENOSCOPY (EGD) WITH PROPOFOL N/A 06/23/2019   Procedure: ESOPHAGOGASTRODUODENOSCOPY (EGD) WITH PROPOFOL;  Surgeon: Arta Silence, MD;   Location: [facility] ENDOSCOPY;  Service: Endoscopy;  Laterality: N/A;   PROSTATECTOMY  2008   REPAIR QUADRICEPS / HAMSTRING MUSCLE Right      reports that he has been smoking cigarettes. He has a 5.40 pack-year smoking history. He has never used smokeless tobacco. He reports current alcohol use. He reports current drug use. Social History  No Known Allergies  Family History  Problem Relation Age of Onset   Cancer Father        PROSTATE     Prior to Admission medications   Medication Sig Start Date End Date Taking? Authorizing Provider  acetaminophen (TYLENOL) 325 MG tablet Take 2 tablets (650 mg total) by mouth every 8 (eight) hours as needed for mild pain, fever or headache. 09/29/20   Raiford Noble Latif, DO  albuterol (PROVENTIL) (2.5 MG/3ML) 0.083% nebulizer solution Take 3 mLs (2.5 mg total) by nebulization every 2 (two) hours as needed for wheezing. 09/29/20   Raiford Noble Latif, DO  amLODipine (NORVASC) 5 MG tablet Take 1 tablet (5 mg total) by mouth daily. 09/30/20   Raiford Noble Latif, DO  aspirin EC 81 MG tablet Take 1 tablet (81 mg total) by mouth daily. Swallow whole. 09/29/20   Raiford Noble Latif, DO  clotrimazole (LOTRIMIN) 1 % cream Apply topically 2 (two) times daily. 09/29/20   Raiford Noble Latif, DO  colchicine 0.6 MG tablet Take 1 tablet (0.6 mg total) by mouth daily. 09/29/20   Raiford Noble Latif, DO  dextromethorphan-guaiFENesin [facility] DM) 30-600 MG 12hr tablet Take 1 tablet by mouth 2 (two) times daily as needed for cough. 09/29/20   Raiford Noble Latif, DO  docusate sodium (COLACE) 100 MG capsule Take 1 capsule (100 mg total) by mouth 2 (two) times daily. 09/29/20   Raiford Noble Latif, DO  Famotidine (ACID CONTROLLER PO) Take 1 tablet by mouth daily as needed (reflux).    [provider]  folic acid (FOLVITE) 1 MG tablet Take 1 tablet (1 mg total) by mouth daily. 09/30/20   Raiford Noble Latif, DO  metoprolol tartrate (LOPRESSOR) 25 MG tablet Take 1 tablet (25 mg  total) by mouth 2 (two) times daily. 09/29/20   Raiford Noble Latif, DO  Multiple Vitamin (MULTIVITAMIN WITH MINERALS) TABS tablet Take 1 tablet by mouth daily. 09/30/20   Raiford Noble Latif, DO  pantoprazole (PROTONIX) 40 MG tablet Take 1 tablet (40 mg total) by mouth at bedtime. 09/29/20   Sheikh, Georgina Quint Latif, DO  polyethylene glycol (MIRALAX / GLYCOLAX) 17 g packet Take 17 g by mouth daily. 09/30/20   Raiford Noble Latif, DO  sertraline (ZOLOFT) 25 MG tablet Take 1 tablet (25 mg total) by mouth daily. 09/29/20   Raiford Noble Latif, DO  sucralfate (CARAFATE) 1 GM/10ML suspension Take 10 mLs (1 g total) by mouth 4 (four) times daily -  with meals and at bedtime. 09/29/20 10/29/20  Raiford Noble Latif, DO  thiamine 100 MG tablet Take 1 tablet (100 mg total) by mouth daily. 09/30/20   Raiford Noble Atkinson, DO    Physical Exam: Vitals:   08/30/21 1645 08/30/21 1700 08/30/21 1715 08/30/21 1815  BP: 101/63 (!) 104/92 Marland Kitchen)  83/65 (!) 149/97  Pulse: (!) 148 (!) 154 91 93  Resp: (!) 25 (!) 23 (!) 21 (!) 21  Temp:      TempSrc:      SpO2: 90% 96% 94% 97%  Weight:      Height:        Constitutional: Chronically ill-appearing, cachectic, disheveled male laying in bed holding chest and grunting and discomfort Vitals:   08/30/21 1645 08/30/21 1700 08/30/21 1715 08/30/21 1815  BP: 101/63 (!) 104/92 (!) 83/65 (!) 149/97  Pulse: (!) 148 (!) 154 91 93  Resp: (!) 25 (!) 23 (!) 21 (!) 21  Temp:      TempSrc:      SpO2: 90% 96% 94% 97%  Weight:      Height:       Eyes: lids and conjunctivae normal ENMT: Mucous membranes are moist.  No dentition.   Neck: normal, supple Respiratory: Diffuse rhonchi but no wheezing or crackle.  Deep labored respiration at times when crying and pain.  No accessory muscle use.  Cardiovascular: Regular rate and rhythm, 3 out of 6 murmur heard best at the left sternal border.  No rubs or gallops.  No extremity edema.  Abdomen: no tenderness,  Bowel sounds positive.   Musculoskeletal: no clubbing / cyanosis. No joint deformity upper and lower extremities.  Skin: no rashes, lesions, ulcers.  Brittle, thickened and discolored nailbeds on all toes. Neurologic: CN 2-12 grossly intact.  Patient is frail and had difficulty maneuvering requiring assistance to use urinal bottle in bed.  Patient also urinated on himself before bottle could be placed.  Speech is garbled although clear at times when he speaks slower. Psychiatric: Alert but limited history.    Labs on Admission: I have personally reviewed following labs and imaging studies  CBC: Recent Labs  Lab 08/30/21 1223 08/30/21 1427  WBC 9.4  --   NEUTROABS 7.5  --   HGB 11.3* 13.3  HCT 33.4* 39.0  MCV 96.0  --   PLT 127*  --    Basic Metabolic Panel: Recent Labs  Lab 08/30/21 1223 08/30/21 1427  NA 127* 129*  K 4.8 4.7  CL 100  --   CO2 13*  --   GLUCOSE 98  --   BUN 26*  --   CREATININE 1.35*  --   CALCIUM 7.3*  --    GFR: Estimated Creatinine Clearance: 63.5 mL/min (A) (by C-G formula based on SCr of 1.35 mg/dL (H)). Liver Function Tests: Recent Labs  Lab 08/30/21 1223  AST 13*  ALT 9  ALKPHOS 30*  BILITOT 0.8  PROT 4.0*  ALBUMIN 1.6*   Recent Labs  Lab 08/30/21 1223  LIPASE 32   No results for input(s): AMMONIA in the last 168 hours. Coagulation Profile: No results for input(s): INR, PROTIME in the last 168 hours. Cardiac Enzymes: No results for input(s): CKTOTAL, CKMB, CKMBINDEX, TROPONINI in the last 168 hours. BNP (last 3 results) No results for input(s): PROBNP in the last 8760 hours. HbA1C: No results for input(s): HGBA1C in the last 72 hours. CBG: No results for input(s): GLUCAP in the last 168 hours. Lipid Profile: No results for input(s): CHOL, HDL, LDLCALC, TRIG, CHOLHDL, LDLDIRECT in the last 72 hours. Thyroid Function Tests: No results for input(s): TSH, T4TOTAL, FREET4, T3FREE, THYROIDAB in the last 72 hours. Anemia Panel: No results for input(s):  VITAMINB12, FOLATE, FERRITIN, TIBC, IRON, RETICCTPCT in the last 72 hours. Urine analysis:    Component Value Date/Time  COLORURINE YELLOW 08/30/2021 1420   APPEARANCEUR CLOUDY (A) 08/30/2021 1420   LABSPEC 1.020 08/30/2021 1420   PHURINE 6.0 08/30/2021 1420   GLUCOSEU NEGATIVE 08/30/2021 1420   HGBUR LARGE (A) 08/30/2021 1420   BILIRUBINUR NEGATIVE 08/30/2021 1420   KETONESUR NEGATIVE 08/30/2021 1420   PROTEINUR 100 (A) 08/30/2021 1420   UROBILINOGEN 0.2 12/24/2009 1944   NITRITE POSITIVE (A) 08/30/2021 1420   LEUKOCYTESUR LARGE (A) 08/30/2021 1420    Radiological Exams on Admission: CT Angio Chest PE W and/or Wo Contrast  Result Date: 08/30/2021 CLINICAL DATA:  Pulmonary embolism (PE) suspected, high prob. Left rib pain EXAM: CT ANGIOGRAPHY CHEST WITH CONTRAST TECHNIQUE: Multidetector CT imaging of the chest was performed using the standard protocol during bolus administration of intravenous contrast. Multiplanar CT image reconstructions and MIPs were obtained to evaluate the vascular anatomy. RADIATION DOSE REDUCTION: This exam was performed according to the departmental dose-optimization program which includes automated exposure control, adjustment of the mA and/or kV according to patient size and/or use of iterative reconstruction technique. CONTRAST:  113mL OMNIPAQUE IOHEXOL 350 MG/ML SOLN COMPARISON:  CT 09/08/2018, 12/03/2009 FINDINGS: Cardiovascular: Satisfactory opacification of the pulmonary arteries to the segmental level. No evidence of pulmonary embolism. Pulmonary trunk measures 3.6 cm in diameter. Ascending thoracic aorta measures up to 3.9 cm in diameter, stable. Scattered atherosclerotic calcification of the aorta and coronary arteries. Normal heart size. No pericardial effusion. Mediastinum/Nodes: No enlarged mediastinal, hilar, or axillary lymph nodes. Thyroid gland and trachea demonstrate no significant findings. Mild circumferential wall thickening of the distal esophagus.  Lungs/Pleura: Interlobular septal thickening within the lower lobes, more pronounced on the left with there is mild hazy ground-glass opacity. Subpleural reticulation within the upper lobes. Minimal centrilobular and paraseptal emphysema. New 4 mm noncalcified pulmonary nodule in the right lower lobe (series 6, image 97). No pleural effusion or pneumothorax. Upper Abdomen: Reflux of contrast into the IVC and hepatic veins. No acute findings within the visualized upper abdomen. Unchanged 2.3 cm right adrenal gland nodule, stable since 2011 and benign. Musculoskeletal: New mild superior endplate compression fractures of T4 and T5. No traumatic listhesis. Acute mildly displaced fracture of the left third rib laterally with adjacent soft tissue stranding and ill-defined hematoma (series 5, image 47). Multiple additional healed nondisplaced bilateral rib fractures. Review of the MIP images confirms the above findings. IMPRESSION: 1. No evidence of acute pulmonary embolism. 2. Acute mildly displaced fracture of the left third rib laterally with adjacent soft tissue stranding and ill-defined hematoma. 3. New mild superior endplate compression fractures of T4 and T5. 4. Interlobular septal thickening within the lower lobes, more pronounced on the left with there is mild hazy ground-glass opacity. Findings may represent mild edema versus atypical/viral infection. 5. Mild circumferential wall thickening of the distal esophagus, which may represent esophagitis. 6. Reflux of contrast into the IVC and hepatic veins, suggesting right heart dysfunction. 7. New 4 mm noncalcified pulmonary nodule in the right lower lobe. No follow-up needed if patient is low-risk. Non-contrast chest CT can be considered in 12 months if patient is high-risk. This recommendation follows the consensus statement: Guidelines for Management of Incidental Pulmonary Nodules Detected on CT Images: From the Fleischner Society 2017; Radiology 2017;  284:228-243. Aortic and coronary artery atherosclerosis (ICD10-I70.0). Electronically Signed   By: Davina Poke D.O.   On: 08/30/2021 15:19   CT Abdomen Pelvis W Contrast  Result Date: 08/30/2021 CLINICAL DATA:  Blunt abdominal trauma. Pain in the left rib cage and left upper abdomen. EXAM:  CT ABDOMEN AND PELVIS WITH CONTRAST TECHNIQUE: Multidetector CT imaging of the abdomen and pelvis was performed using the standard protocol following bolus administration of intravenous contrast. RADIATION DOSE REDUCTION: This exam was performed according to the departmental dose-optimization program which includes automated exposure control, adjustment of the mA and/or kV according to patient size and/or use of iterative reconstruction technique. CONTRAST:  168mL OMNIPAQUE IOHEXOL 350 MG/ML SOLN, 22mL OMNIPAQUE IOHEXOL 300 MG/ML SOLN COMPARISON:  June 19, 2019 FINDINGS: Lower chest: Healed or healing left eleventh rib fracture. No acute findings. Hepatobiliary: No focal liver abnormality is seen. Status post cholecystectomy. No biliary dilatation. Pancreas: Unremarkable. No pancreatic ductal dilatation or surrounding inflammatory changes. Spleen: No splenic injury or perisplenic hematoma. Adrenals/Urinary Tract: Stable in size 2.3 cm right adrenal mass. Bilateral renal cysts, as well as some hypoattenuated masses in bilateral kidneys which are too small to be actually characterize by CT. Stomach/Bowel: Small hiatal hernia. Stomach is within normal limits. Appendix appears normal. No evidence of bowel wall thickening, distention, or inflammatory changes. Scattered left colonic diverticulosis without evidence of diverticulitis. Vascular/Lymphatic: No significant vascular findings are present. No enlarged abdominal or pelvic lymph nodes. Reproductive: Surgically absent prostate gland. Other: Anterior abdominal wall repair. Musculoskeletal: No acute fracture is seen. IMPRESSION: 1. No evidence of acute traumatic injury to  the abdomen or pelvis. 2. Healed or healing left eleventh rib fracture. 3. Bilateral renal cysts, as well as some hypoattenuated masses in bilateral kidneys which are too small to be actually characterize by CT. 4. Small hiatal hernia. 5. Stable in size 2.3 cm right adrenal mass, likely benign. Electronically Signed   By: Fidela Salisbury M.D.   On: 08/30/2021 16:49   DG Chest Port 1 View  Result Date: 08/30/2021 CLINICAL DATA:  Short of breath.  Left-sided chest wall pain. EXAM: PORTABLE CHEST 1 VIEW COMPARISON:  10/07/2020. FINDINGS: Cardiac silhouette is normal in size. No mediastinal or hilar masses. Chronic interstitial thickening, most evident in the left mid and lower lung, stable. No evidence of pneumonia or pulmonary edema. No convincing pleural effusion and no pneumothorax. Skeletal structures are grossly intact. IMPRESSION: No acute cardiopulmonary disease. Electronically Signed   By: Lajean Manes M.D.   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 Juliane Lack initially recommended dose of verapamil but due to persistent hypotension discuss again with cardiology to initiate amiodarone. -CHA2DS2-VASc of 3 (age, HTN, DM)- given intermittent atrial flutter will start IV heparin infusion. Althought he is poor candidate for long-term anticoagulation since he is non-compliant due to homelessness and a fall risk  -Obtaining echocardiogram in the morning  Suspected left side pneumonia -hazy ground glass opacity seen on CT chest -negative flu/COVID PCR -start IV rocephin and Azithromycin -blood cultures was not obtained until after antibiotics   UTI -on IV Rocephin. Culture pending  Left third rib fracture with hematoma T4-T5 mild superior  endplate compression fracture  -unclear what mechanism of trauma pt had. Suspect his complain of severe chest pain is coming from rib fracture -Lidocaine patch  -Norco PRN for moderate and severe pain  -incentive spirometry   Hyponatremia  127-likely hypovolemic.  Repeat sodium now after 2 L of fluid in the ED  AKI creatinine of 1.35 from prior of about 1 has received  2L of fluids avoid nephrotoxic agent  Pulmonary nodule  85mm in RLL. Need further follow up imaging in 12 months.   DVT prophylaxis:.IV heparin infusion Code Status: Full Family Communication: Plan discussed with patient at bedside  disposition Plan: Home with at least 2 midnight stays  Consults called: Cardiology Admission status: inpatient  Level of care: Telemetry Cardiac  Status is: Inpatient  Remains inpatient appropriate because: Admit - It is my clinical opinion that admission to INPATIENT is reasonable and necessary because this patient will require at least 2 midnights in the hospital to treat this condition based on the medical complexity of the problems presented.  Given the aforementioned information, the predictability of an adverse outcome is felt to be significant.         Orene Desanctis DO [facility]   If 7PM-7AM, please contact night-coverage [email or web address]   08/30/2021, 8:27 PM

## 2021-08-30 NOTE — ED Triage Notes (Signed)
Pt BIB [facility] from home. Pt feeling sick for about 10 days having pain to the L side rib cage upper abd area. Pt writhing in pain upon arrival. HR sinus tach in the 150s. All other VSS. Dr. Maryan Rued at bedside evaluating.

## 2021-08-30 NOTE — Progress Notes (Signed)
ANTICOAGULATION CONSULT NOTE - Initial Consult  Pharmacy Consult for Heparin Indication: atrial fibrillation  No Known Allergies  Patient Measurements: Height: 5\' 9"  (175.3 cm) Weight: 99.8 kg (220 lb) IBW/kg (Calculated) : 70.7 Heparin Dosing Weight: 91..8 kg  Vital Signs: Temp: 98.6 F (37 C) (01/15 1209) Temp Source: Oral (01/15 1209) BP: 149/97 (01/15 1815) Pulse Rate: 93 (01/15 1815)  Labs: Recent Labs    08/30/21 1223 08/30/21 1358 08/30/21 1427  HGB 11.3*  --  13.3  HCT 33.4*  --  39.0  PLT 127*  --   --   CREATININE 1.35*  --   --   TROPONINIHS 36* 59*  --     Estimated Creatinine Clearance: 63.5 mL/min (A) (by C-G formula based on SCr of 1.35 mg/dL (H)).   Medical History: Past Medical History:  Diagnosis Date   Arthritis    "fingers" (07/14/2018)   Depression    DVT (deep venous thrombosis) (HCC) LLE   Hepatitis C     finished harvoni tx ~ 2017   Hypercholesterolemia    Hypertension    Prostate cancer ([place]) 68yrs ago   Pulmonary embolism and infarction ([place]) 07/13/2018   Sleep apnea    not currently using cpap, mask causing vertigo   Type II diabetes mellitus (HCC)     Medications:  (Not in a hospital admission)  Scheduled:   lidocaine  1 patch Transdermal Q24H   verapamil  80 mg Oral Once   Infusions:   azithromycin     cefTRIAXone (ROCEPHIN)  IV     PRN: acetaminophen, HYDROcodone-acetaminophen  Assessment: 22 yom with a history of diabetes, PE/DVT, hypertension, prior opiate abuse . Patient is presenting with SOB. Heparin per pharmacy consult placed for atrial fibrillation.  Patient is not on anticoagulation prior to arrival.  Hgb 13.3; plt 127  Goal of Therapy:  Heparin level 0.3-0.7 units/ml Monitor platelets by anticoagulation protocol: Yes   Plan:  Give 4000 units bolus x 1 Start heparin infusion at 1400 units/hr Check anti-Xa level in 6-8 hours and daily while on heparin Continue to monitor H&H and  platelets  Lorelei Pont, PharmD, BCPS 08/30/2021 8:31 PM ED Clinical Pharmacist -  [phone]

## 2021-08-31 ENCOUNTER — Inpatient Hospital Stay (HOSPITAL_COMMUNITY): Payer: Medicare HMO

## 2021-08-31 DIAGNOSIS — I4892 Unspecified atrial flutter: Secondary | ICD-10-CM

## 2021-08-31 LAB — ECHOCARDIOGRAM COMPLETE
AR max vel: 3.3 cm2
AV Area VTI: 3.57 cm2
AV Area mean vel: 3.19 cm2
AV Mean grad: 6 mmHg
AV Peak grad: 9.2 mmHg
Ao pk vel: 1.52 m/s
Area-P 1/2: 2.88 cm2
Calc EF: 50.6 %
Height: 69 in
MV VTI: 1.14 cm2
S' Lateral: 2.8 cm
Single Plane A2C EF: 45.2 %
Single Plane A4C EF: 53.1 %
Weight: 3520 oz

## 2021-08-31 LAB — CBC
HCT: 45.3 % (ref 39.0–52.0)
Hemoglobin: 14.6 g/dL (ref 13.0–17.0)
MCH: 30.9 pg (ref 26.0–34.0)
MCHC: 32.2 g/dL (ref 30.0–36.0)
MCV: 95.8 fL (ref 80.0–100.0)
Platelets: 163 10*3/uL (ref 150–400)
RBC: 4.73 MIL/uL (ref 4.22–5.81)
RDW: 13 % (ref 11.5–15.5)
WBC: 9.4 10*3/uL (ref 4.0–10.5)
nRBC: 0 % (ref 0.0–0.2)

## 2021-08-31 LAB — CBG MONITORING, ED
Glucose-Capillary: 141 mg/dL — ABNORMAL HIGH (ref 70–99)
Glucose-Capillary: 235 mg/dL — ABNORMAL HIGH (ref 70–99)

## 2021-08-31 LAB — BASIC METABOLIC PANEL
Anion gap: 7 (ref 5–15)
BUN: 40 mg/dL — ABNORMAL HIGH (ref 8–23)
CO2: 19 mmol/L — ABNORMAL LOW (ref 22–32)
Calcium: 8 mg/dL — ABNORMAL LOW (ref 8.9–10.3)
Chloride: 103 mmol/L (ref 98–111)
Creatinine, Ser: 2.27 mg/dL — ABNORMAL HIGH (ref 0.61–1.24)
GFR, Estimated: 31 mL/min — ABNORMAL LOW (ref >60)
Glucose, Bld: 94 mg/dL (ref 70–99)
Potassium: 4.7 mmol/L (ref 3.5–5.1)
Sodium: 129 mmol/L — ABNORMAL LOW (ref 135–145)

## 2021-08-31 LAB — HEPARIN LEVEL (UNFRACTIONATED)
Heparin Unfractionated: <0.1 IU/mL — ABNORMAL LOW (ref 0.30–0.70)
Heparin Unfractionated: <0.1 IU/mL — ABNORMAL LOW (ref 0.30–0.70)
Heparin Unfractionated: <0.1 IU/mL — ABNORMAL LOW (ref 0.30–0.70)
Heparin Unfractionated: 0.27 IU/mL — ABNORMAL LOW (ref 0.30–0.70)

## 2021-08-31 LAB — TSH: TSH: 1.452 u[IU]/mL (ref 0.350–4.500)

## 2021-08-31 LAB — LACTIC ACID, PLASMA: Lactic Acid, Venous: 1.6 mmol/L (ref 0.5–1.9)

## 2021-08-31 MED ORDER — LACTATED RINGERS IV SOLN: INTRAVENOUS | Status: DC

## 2021-08-31 MED ORDER — GUAIFENESIN ER 600 MG PO TB12
600.0000 mg | ORAL_TABLET | Freq: Two times a day (BID) | ORAL | Status: DC
  Administered 2021-08-31 – 2021-09-04 (×9): 600 mg via ORAL
  Filled 2021-08-31 (×9): qty 1

## 2021-08-31 MED ORDER — HEPARIN BOLUS VIA INFUSION
2000.0000 [IU] | Freq: Once | INTRAVENOUS | Status: AC
  Administered 2021-08-31: 2000 [IU] via INTRAVENOUS
  Filled 2021-08-31: qty 2000

## 2021-08-31 MED ORDER — HEPARIN BOLUS VIA INFUSION
3000.0000 [IU] | Freq: Once | INTRAVENOUS | Status: AC
  Administered 2021-08-31: 3000 [IU] via INTRAVENOUS
  Filled 2021-08-31: qty 3000

## 2021-08-31 MED ORDER — TRAMADOL HCL 50 MG PO TABS
50.0000 mg | ORAL_TABLET | Freq: Four times a day (QID) | ORAL | Status: DC | PRN
Start: 2021-08-31 — End: 2021-09-04
  Administered 2021-08-31 – 2021-09-03 (×3): 50 mg via ORAL
  Filled 2021-08-31 (×4): qty 1

## 2021-08-31 MED ORDER — TRAMADOL HCL 50 MG PO TABS: 50.0000 mg | ORAL_TABLET | Freq: Four times a day (QID) | ORAL | Status: DC | PRN

## 2021-08-31 NOTE — ED Notes (Signed)
Breakfast orders placed 

## 2021-08-31 NOTE — Progress Notes (Signed)
ANTICOAGULATION CONSULT NOTE - Initial Consult  Pharmacy Consult for Heparin Indication: atrial fibrillation  No Known Allergies  Patient Measurements: Height: 5\' 9"  (175.3 cm) Weight: 99.8 kg (220 lb) IBW/kg (Calculated) : 70.7 Heparin Dosing Weight: 91.8 kg  Vital Signs: BP: 125/82 (01/16 1215) Pulse Rate: 75 (01/16 1215)  Labs: Recent Labs    08/30/21 1223 08/30/21 1358 08/30/21 1427 08/31/21 0241 08/31/21 0553 08/31/21 0959  HGB 11.3*  --  13.3  --  14.6  --   HCT 33.4*  --  39.0  --  45.3  --   PLT 127*  --   --   --  163  --   HEPARINUNFRC  --   --   --  <0.10* <0.10* <0.10*  CREATININE 1.35*  --   --   --  2.27*  --   TROPONINIHS 36* 59*  --   --   --   --      Estimated Creatinine Clearance: 37.8 mL/min (A) (by C-G formula based on SCr of 2.27 mg/dL (H)).   Medical History: Past Medical History:  Diagnosis Date   Arthritis    "fingers" (07/14/2018)   Depression    DVT (deep venous thrombosis) (HCC) LLE   Hepatitis C     finished harvoni tx ~ 2017   Hypercholesterolemia    Hypertension    Prostate cancer ([place]) 70yrs ago   Pulmonary embolism and infarction ([place]) 07/13/2018   Sleep apnea    not currently using cpap, mask causing vertigo   Type II diabetes mellitus (HCC)     Medications:  (Not in a hospital admission)  Scheduled:   guaiFENesin  600 mg Oral BID   heparin  3,000 Units Intravenous Once   lidocaine  1 patch Transdermal Q24H   Infusions:   amiodarone 30 mg/hr (08/31/21 1219)   azithromycin Stopped (08/31/21 0203)   cefTRIAXone (ROCEPHIN)  IV Stopped (08/30/21 2348)   heparin 1,600 Units/hr (08/31/21 1209)   PRN: acetaminophen, HYDROcodone-acetaminophen, ipratropium, levalbuterol  Assessment: Todd [name] with a history of diabetes, PE/DVT, hypertension, prior opiate abuse . Patient is presenting with SOB. Heparin per pharmacy consult placed for atrial fibrillation.  Heparin level <0.1 x2 (on heparin 1600 units/hr) Line is  functioning well and able to draw back per nurse No signs/symptoms of bleed  Goal of Therapy:  Heparin level 0.3-0.7 units/ml Monitor platelets by anticoagulation protocol: Yes   Plan:  Give 3000 units bolus x 1 Increase heparin infusion to 1800 units/hr Check anti-Xa level in 6-8 hours and daily while on heparin Continue to monitor H&H and platelets  Thank you for allowing pharmacy to be a part of this patients care.  Donnald Garre, PharmD Clinical Pharmacist  Please check AMION for all [place] numbers After 10:00 PM, call [place] [phone]

## 2021-08-31 NOTE — Progress Notes (Signed)
[place] for Heparin Indication: atrial fibrillation Brief A/P: Heparin level subtherapeutic Rebolus, Increase Heparin rate  No Known Allergies  Patient Measurements: Height: 5\' 9"  (175.3 cm) Weight: 99.8 kg (220 lb) IBW/kg (Calculated) : 70.7 Heparin Dosing Weight: 91..8 kg  Vital Signs: BP: 111/87 (01/16 0245) Pulse Rate: 82 (01/16 0245)  Labs: Recent Labs    08/30/21 1223 08/30/21 1358 08/30/21 1427 08/31/21 0241  HGB 11.3*  --  13.3  --   HCT 33.4*  --  39.0  --   PLT 127*  --   --   --   HEPARINUNFRC  --   --   --  <0.10*  CREATININE 1.35*  --   --   --   TROPONINIHS 36* 59*  --   --      Estimated Creatinine Clearance: 63.5 mL/min (A) (by C-G formula based on SCr of 1.35 mg/dL (H)).   Assessment: 66 y.o. male with new onset Afib for heparin  Goal of Therapy:  Heparin level 0.3-0.7 units/ml Monitor platelets by anticoagulation protocol: Yes   Plan:  Heparin 2000 units IV bolus, then increase heparin  1600 units/hr Check heparin level in 6 hours.  Phillis Knack, PharmD, BCPS

## 2021-08-31 NOTE — Progress Notes (Signed)
ANTICOAGULATION CONSULT NOTE   Pharmacy Consult for Heparin Indication: atrial fibrillation  No Known Allergies  Patient Measurements: Height: 5\' 9"  (175.3 cm) Weight: 99.8 kg (220 lb) IBW/kg (Calculated) : 70.7 Heparin Dosing Weight: 91.8 kg  Vital Signs: Temp: 98.6 F (37 C) (01/16 2215) Temp Source: Oral (01/16 2215) BP: 109/68 (01/16 2215) Pulse Rate: 85 (01/16 2215)  Labs: Recent Labs    08/30/21 1223 08/30/21 1358 08/30/21 1427 08/31/21 0241 08/31/21 0553 08/31/21 0959 08/31/21 2100  HGB 11.3*  --  13.3  --  14.6  --   --   HCT 33.4*  --  39.0  --  45.3  --   --   PLT 127*  --   --   --  163  --   --   HEPARINUNFRC  --   --   --    < > <0.10* <0.10* 0.27*  CREATININE 1.35*  --   --   --  2.27*  --   --   TROPONINIHS 36* 59*  --   --   --   --   --    < > = values in this interval not displayed.     Estimated Creatinine Clearance: 37.8 mL/min (A) (by C-G formula based on SCr of 2.27 mg/dL (H)).   Medical History: Past Medical History:  Diagnosis Date   Arthritis    "fingers" (07/14/2018)   Depression    DVT (deep venous thrombosis) (HCC) LLE   Hepatitis C     finished [facility] tx ~ 2017   Hypercholesterolemia    Hypertension    Prostate cancer ([place]) 61yrs ago   Pulmonary embolism and infarction ([place]) 07/13/2018   Sleep apnea    not currently using cpap, mask causing vertigo   Type II diabetes mellitus ([place])     Medications:  (Not in a hospital admission)  Scheduled:   guaiFENesin  600 mg Oral BID   lidocaine  1 patch Transdermal Q24H   Infusions:   amiodarone 30 mg/hr (08/31/21 1532)   azithromycin Stopped (08/31/21 2227)   cefTRIAXone (ROCEPHIN)  IV 2 g (08/31/21 2241)   heparin 1,800 Units/hr (08/31/21 2140)   lactated ringers 75 mL/hr at 08/31/21 2057   PRN: acetaminophen, HYDROcodone-acetaminophen, ipratropium, levalbuterol, traMADol  Assessment: 41 yom with a history of diabetes, PE/DVT, hypertension, prior opiate abuse . Patient  is presenting with SOB. Heparin per pharmacy consult placed for atrial fibrillation.  Heparin level uptrend from undetectable to slightly subtherapeutic s/p rate increase to 1800 units/hr  Goal of Therapy:  Heparin level 0.3-0.7 units/ml Monitor platelets by anticoagulation protocol: Yes   Plan:  Increase heparin gtt to 1900 units/hr F/u 6 hour heparin level with AM labs  Bertis Ruddy, PharmD Clinical Pharmacist ED Pharmacist Phone # [phone] 08/31/2021 11:08 PM

## 2021-08-31 NOTE — Progress Notes (Signed)
° °  Echocardiogram 2D Echocardiogram has been performed.  Beryle Beams 08/31/2021, 9:52 AM

## 2021-08-31 NOTE — Progress Notes (Signed)
PROGRESS NOTE    Todd [name].  RDE:[id number] DOB: 1956/03/15 DOA: 08/30/2021 PCP: [name], MD   No chief complaint on file.   Brief Narrative:  Todd Steuber. is a 66 y.o. male with medical history significant for hypertension, PE/DVT, type 2 diabetes, polysubstance abuse who presents with concerns of increasing shortness of breath. CTA chest was negative for PE, had mildly displaced fracture of the left third rib, new superior endplate compression fracture of T4 and T5, there is also pronounced left-sided groundglass opacity. UDS is positive for cocaine.    Assessment & Plan:   Principal Problem:   Tachycardia Active Problems:   AKI (acute kidney injury) ([place])   Atrial flutter (HCC)   Acute lower UTI   Community acquired pneumonia   Hyponatremia   Rib fracture   Compression fracture of body of thoracic vertebra (HCC)   Pulmonary nodule   Atrial flutter with rapid ventricular response (HCC)   Atrial flutter with intermittent hypotension:  - rate controlled on IV amiodarone and IV heparin. Cardiology consulted by EDP/admitting physician.  - on IV heparin for anti coagulation.  - get echocardiogram and TSH.  -  Sob has improved.    LEFT third rib fracture with hematoma and T4-T5 Mild sup end plate compression fracture:  Pain control, incentive spirometry.  Physical  therapy evaluation.    UTI Urine cultures to be sent, on IV antibiotics.   Suspected left sided pneumonia:  Resume IV antibiotics.  Follow up blood cultures.  Sputum cultures if able.  Follow strep and legionella antigen.  New 4 mm noncalcified pulmonary nodule in the right lower lobe. Non-contrast chest CT can be considered in 12 months if patient is high-risk   AKI:  Probably from dehydration.  No hydronephrosis on CT abd and pelvis.  Hydrate and repeat renal parameters ina.    Polysubstance abuse:  TOC will be consulted for resources.     Hyponatremia:  From  dehydration.     DVT prophylaxis: Heparin.  Code Status: Full code.  Family Communication: none at bedside.  Disposition:   Status is: Inpatient  Remains inpatient appropriate because: IV heparin.        Consultants:  Cardiology   Procedures: echocardiogram.   Antimicrobials: rocephin or zithromax.   Subjective: Wants pain meds.   Objective: Vitals:   08/31/21 1200 08/31/21 1215 08/31/21 1230 08/31/21 1245  BP: 96/72 125/82 109/78 126/85  Pulse: 73 75 72 77  Resp: 18 17 (!) 24 20  Temp:      TempSrc:      SpO2: 92% 91% 91% 93%  Weight:      Height:        Intake/Output Summary (Last 24 hours) at 08/31/2021 1400 Last data filed at 08/31/2021 0559 Gross per 24 hour  Intake 2993.73 ml  Output --  Net 2993.73 ml   Filed Weights   08/30/21 1210  Weight: 99.8 kg    Examination:  General exam: well developed gentleman in mild distress from left lateral chest wall pain.  Respiratory system: Clear to auscultation. Respiratory effort normal. Cardiovascular system: S1 & S2 heard, irregularly irregular. No pedal edema.  Gastrointestinal system: Abdomen is nondistended, soft and nontender. Normal bowel sounds heard. Central nervous system: Alert and oriented. No focal neurological deficits. Extremities: Symmetric 5 x 5 power. Skin: No rashes, lesions or ulcers Psychiatry:  Mood & affect appropriate.     Data Reviewed: I have personally reviewed following labs and imaging studies  CBC: Recent  Labs  Lab 08/30/21 1223 08/30/21 1427 08/31/21 0553  WBC 9.4  --  9.4  NEUTROABS 7.5  --   --   HGB 11.3* 13.3 14.6  HCT 33.4* 39.0 45.3  MCV 96.0  --  95.8  PLT 127*  --  834    Basic Metabolic Panel: Recent Labs  Lab 08/30/21 1223 08/30/21 1427 08/31/21 0553  NA 127* 129* 129*  K 4.8 4.7 4.7  CL 100  --  103  CO2 13*  --  19*  GLUCOSE 98  --  94  BUN 26*  --  40*  CREATININE 1.35*  --  2.27*  CALCIUM 7.3*  --  8.0*    GFR: Estimated Creatinine  Clearance: 37.8 mL/min (A) (by C-G formula based on SCr of 2.27 mg/dL (H)).  Liver Function Tests: Recent Labs  Lab 08/30/21 1223  AST 13*  ALT 9  ALKPHOS 30*  BILITOT 0.8  PROT 4.0*  ALBUMIN 1.6*    CBG: No results for input(s): GLUCAP in the last 168 hours.   Recent Results (from the past 240 hour(s))  Resp Panel by RT-PCR (Flu A&B, Covid) Nasopharyngeal Swab     Status: None   Collection Time: 08/30/21  2:23 PM   Specimen: Nasopharyngeal Swab; Nasopharyngeal(NP) swabs in vial transport medium  Result Value Ref Range Status   SARS Coronavirus 2 by RT PCR NEGATIVE NEGATIVE Final    Comment: (NOTE) SARS-CoV-2 target nucleic acids are NOT DETECTED.  The SARS-CoV-2 RNA is generally detectable in upper respiratory specimens during the acute phase of infection. The lowest concentration of SARS-CoV-2 viral copies this assay can detect is 138 copies/mL. A negative result does not preclude SARS-Cov-2 infection and should not be used as the sole basis for treatment or other patient management decisions. A negative result may occur with  improper specimen collection/handling, submission of specimen other than nasopharyngeal swab, presence of viral mutation(s) within the areas targeted by this assay, and inadequate number of viral copies(<138 copies/mL). A negative result must be combined with clinical observations, patient history, and epidemiological information. The expected result is Negative.  Fact Sheet for Patients:  EntrepreneurPulse.com.au  Fact Sheet for Healthcare Providers:  IncredibleEmployment.be  This test is no t yet approved or cleared by the [place] FDA and  has been authorized for detection and/or diagnosis of SARS-CoV-2 by FDA under an Emergency Use Authorization (EUA). This EUA will remain  in effect (meaning this test can be used) for the duration of the COVID-19 declaration under Section 564(b)(1) of the Act,  21 U.S.C.section 360bbb-3(b)(1), unless the authorization is terminated  or revoked sooner.       Influenza A by PCR NEGATIVE NEGATIVE Final   Influenza B by PCR NEGATIVE NEGATIVE Final    Comment: (NOTE) The Xpert Xpress SARS-CoV-2/FLU/RSV plus assay is intended as an aid in the diagnosis of influenza from Nasopharyngeal swab specimens and should not be used as a sole basis for treatment. Nasal washings and aspirates are unacceptable for Xpert Xpress SARS-CoV-2/FLU/RSV testing.  Fact Sheet for Patients: EntrepreneurPulse.com.au  Fact Sheet for Healthcare Providers: IncredibleEmployment.be  This test is not yet approved or cleared by the [place] FDA and has been authorized for detection and/or diagnosis of SARS-CoV-2 by FDA under an Emergency Use Authorization (EUA). This EUA will remain in effect (meaning this test can be used) for the duration of the COVID-19 declaration under Section 564(b)(1) of the Act, 21 U.S.C. section 360bbb-3(b)(1), unless the authorization is terminated or revoked.  Performed at [facility], [place] [address] [place], [place] [address]   Culture, blood (routine x 2)     Status: None (Preliminary result)   Collection Time: 08/30/21  9:20 PM   Specimen: BLOOD  Result Value Ref Range Status   Specimen Description BLOOD LEFT ARM  Final   Special Requests   Final    BOTTLES DRAWN AEROBIC AND ANAEROBIC Blood Culture results may not be optimal due to an inadequate volume of blood received in culture bottles   Culture   Final    NO GROWTH < 12 HOURS Performed at [facility], [place] [address] [place], [place] [address]    Report Status PENDING  Incomplete         Radiology Studies: CT Angio Chest PE W and/or Wo Contrast  Result Date: 08/30/2021 CLINICAL DATA:  Pulmonary embolism (PE) suspected, high prob. Left rib pain EXAM: CT ANGIOGRAPHY CHEST WITH CONTRAST TECHNIQUE: Multidetector CT imaging  of the chest was performed using the standard protocol during bolus administration of intravenous contrast. Multiplanar CT image reconstructions and MIPs were obtained to evaluate the vascular anatomy. RADIATION DOSE REDUCTION: This exam was performed according to the departmental dose-optimization program which includes automated exposure control, adjustment of the mA and/or kV according to patient size and/or use of iterative reconstruction technique. CONTRAST:  175mL OMNIPAQUE IOHEXOL 350 MG/ML SOLN COMPARISON:  CT 09/08/2018, 12/03/2009 FINDINGS: Cardiovascular: Satisfactory opacification of the pulmonary arteries to the segmental level. No evidence of pulmonary embolism. Pulmonary trunk measures 3.6 cm in diameter. Ascending thoracic aorta measures up to 3.9 cm in diameter, stable. Scattered atherosclerotic calcification of the aorta and coronary arteries. Normal heart size. No pericardial effusion. Mediastinum/Nodes: No enlarged mediastinal, hilar, or axillary lymph nodes. Thyroid gland and trachea demonstrate no significant findings. Mild circumferential wall thickening of the distal esophagus. Lungs/Pleura: Interlobular septal thickening within the lower lobes, more pronounced on the left with there is mild hazy ground-glass opacity. Subpleural reticulation within the upper lobes. Minimal centrilobular and paraseptal emphysema. New 4 mm noncalcified pulmonary nodule in the right lower lobe (series 6, image 97). No pleural effusion or pneumothorax. Upper Abdomen: Reflux of contrast into the IVC and hepatic veins. No acute findings within the visualized upper abdomen. Unchanged 2.3 cm right adrenal gland nodule, stable since 2011 and benign. Musculoskeletal: New mild superior endplate compression fractures of T4 and T5. No traumatic listhesis. Acute mildly displaced fracture of the left third rib laterally with adjacent soft tissue stranding and ill-defined hematoma (series 5, image 47). Multiple additional  healed nondisplaced bilateral rib fractures. Review of the MIP images confirms the above findings. IMPRESSION: 1. No evidence of acute pulmonary embolism. 2. Acute mildly displaced fracture of the left third rib laterally with adjacent soft tissue stranding and ill-defined hematoma. 3. New mild superior endplate compression fractures of T4 and T5. 4. Interlobular septal thickening within the lower lobes, more pronounced on the left with there is mild hazy ground-glass opacity. Findings may represent mild edema versus atypical/viral infection. 5. Mild circumferential wall thickening of the distal esophagus, which may represent esophagitis. 6. Reflux of contrast into the IVC and hepatic veins, suggesting right heart dysfunction. 7. New 4 mm noncalcified pulmonary nodule in the right lower lobe. No follow-up needed if patient is low-risk. Non-contrast chest CT can be considered in 12 months if patient is high-risk. This recommendation follows the consensus statement: Guidelines for Management of Incidental Pulmonary Nodules Detected on CT Images: From the Fleischner Society 2017;  Radiology 2017; [id number]. Aortic and coronary artery atherosclerosis (ICD10-I70.0). Electronically Signed   By: Davina Poke D.O.   On: 08/30/2021 15:19   CT Abdomen Pelvis W Contrast  Result Date: 08/30/2021 CLINICAL DATA:  Blunt abdominal trauma. Pain in the left rib cage and left upper abdomen. EXAM: CT ABDOMEN AND PELVIS WITH CONTRAST TECHNIQUE: Multidetector CT imaging of the abdomen and pelvis was performed using the standard protocol following bolus administration of intravenous contrast. RADIATION DOSE REDUCTION: This exam was performed according to the departmental dose-optimization program which includes automated exposure control, adjustment of the mA and/or kV according to patient size and/or use of iterative reconstruction technique. CONTRAST:  178mL OMNIPAQUE IOHEXOL 350 MG/ML SOLN, 60mL OMNIPAQUE IOHEXOL 300 MG/ML  SOLN COMPARISON:  June 19, 2019 FINDINGS: Lower chest: Healed or healing left eleventh rib fracture. No acute findings. Hepatobiliary: No focal liver abnormality is seen. Status post cholecystectomy. No biliary dilatation. Pancreas: Unremarkable. No pancreatic ductal dilatation or surrounding inflammatory changes. Spleen: No splenic injury or perisplenic hematoma. Adrenals/Urinary Tract: Stable in size 2.3 cm right adrenal mass. Bilateral renal cysts, as well as some hypoattenuated masses in bilateral kidneys which are too small to be actually characterize by CT. Stomach/Bowel: Small hiatal hernia. Stomach is within normal limits. Appendix appears normal. No evidence of bowel wall thickening, distention, or inflammatory changes. Scattered left colonic diverticulosis without evidence of diverticulitis. Vascular/Lymphatic: No significant vascular findings are present. No enlarged abdominal or pelvic lymph nodes. Reproductive: Surgically absent prostate gland. Other: Anterior abdominal wall repair. Musculoskeletal: No acute fracture is seen. IMPRESSION: 1. No evidence of acute traumatic injury to the abdomen or pelvis. 2. Healed or healing left eleventh rib fracture. 3. Bilateral renal cysts, as well as some hypoattenuated masses in bilateral kidneys which are too small to be actually characterize by CT. 4. Small hiatal hernia. 5. Stable in size 2.3 cm right adrenal mass, likely benign. Electronically Signed   By: Fidela Salisbury M.D.   On: 08/30/2021 16:49   DG Chest Port 1 View  Result Date: 08/30/2021 CLINICAL DATA:  Short of breath.  Left-sided chest wall pain. EXAM: PORTABLE CHEST 1 VIEW COMPARISON:  10/07/2020. FINDINGS: Cardiac silhouette is normal in size. No mediastinal or hilar masses. Chronic interstitial thickening, most evident in the left mid and lower lung, stable. No evidence of pneumonia or pulmonary edema. No convincing pleural effusion and no pneumothorax. Skeletal structures are grossly  intact. IMPRESSION: No acute cardiopulmonary disease. Electronically Signed   By: Lajean Manes M.D.   On: 08/30/2021 12:21   ECHOCARDIOGRAM COMPLETE  Result Date: 08/31/2021    ECHOCARDIOGRAM REPORT   Patient Name:   Todd Mcneel. Date of Exam: 08/31/2021 Medical Rec #:  [id number]          Height:       69.0 in Accession #:    [id number]         Weight:       220.0 lb Date of Birth:  Sep 19, 1955          BSA:          2.151 m Patient Age:    42 years           BP:           104/75 mmHg Patient Gender: M                  HR:           80 bpm. Exam Location:  Inpatient Procedure:  2D Echo, Cardiac Doppler and Color Doppler Indications:    AFLUTTER  History:        Patient has prior history of Echocardiogram examinations, most                 recent 09/05/2020. Risk Factors:Hypertension and Diabetes. PULM.                 EMBOLI / HYPERCHOLESTEROLEMIA.  Sonographer:    Beryle Beams Referring Phys: [id number] [place] T TU IMPRESSIONS  1. Left ventricular ejection fraction, by estimation, is 60 to 65%. The left ventricle has normal function. The left ventricle has no regional wall motion abnormalities. There is mild left ventricular hypertrophy. Left ventricular diastolic parameters are consistent with Grade I diastolic dysfunction (impaired relaxation).  2. Right ventricular systolic function is normal. The right ventricular size is normal. The estimated right ventricular systolic pressure is 17.4 mmHg.  3. Left atrial size was moderately dilated.  4. The mitral valve is abnormal. Trivial mitral valve regurgitation.  5. The aortic valve is tricuspid. Aortic valve regurgitation is not visualized.  6. The inferior vena cava is dilated in size with >50% respiratory variability, suggesting right atrial pressure of 8 mmHg.  7. Rhythm strip during this exam demostrated normal sinus rhythm. Comparison(s): No significant change from prior study. 09/05/2020: LVEF 60-65%, grade 1 DD, mild MR. FINDINGS  Left Ventricle: Left  ventricular ejection fraction, by estimation, is 60 to 65%. The left ventricle has normal function. The left ventricle has no regional wall motion abnormalities. The left ventricular internal cavity size was normal in size. There is  mild left ventricular hypertrophy. Left ventricular diastolic parameters are consistent with Grade I diastolic dysfunction (impaired relaxation). Indeterminate filling pressures. Right Ventricle: The right ventricular size is normal. No increase in right ventricular wall thickness. Right ventricular systolic function is normal. The tricuspid regurgitant velocity is 2.43 m/s, and with an assumed right atrial pressure of 8 mmHg, the estimated right ventricular systolic pressure is 08.1 mmHg. Left Atrium: Left atrial size was moderately dilated. Right Atrium: Right atrial size was normal in size. Pericardium: There is no evidence of pericardial effusion. Mitral Valve: The mitral valve is abnormal. There is mild thickening of the mitral valve leaflet(s). Trivial mitral valve regurgitation. MV peak gradient, 68.9 mmHg. The mean mitral valve gradient is 41.0 mmHg. Tricuspid Valve: The tricuspid valve is grossly normal. Tricuspid valve regurgitation is trivial. Aortic Valve: The aortic valve is tricuspid. Aortic valve regurgitation is not visualized. Aortic valve mean gradient measures 6.0 mmHg. Aortic valve peak gradient measures 9.2 mmHg. Aortic valve area, by VTI measures 3.57 cm. Pulmonic Valve: The pulmonic valve was normal in structure. Pulmonic valve regurgitation is not visualized. Aorta: The aortic root and ascending aorta are structurally normal, with no evidence of dilitation. Venous: The inferior vena cava is dilated in size with greater than 50% respiratory variability, suggesting right atrial pressure of 8 mmHg. IAS/Shunts: No atrial level shunt detected by color flow Doppler. EKG: Rhythm strip during this exam demostrated normal sinus rhythm.  LEFT VENTRICLE PLAX 2D LVIDd:          4.40 cm     Diastology LVIDs:         2.80 cm     LV e' medial:    6.20 cm/s LV PW:         1.60 cm     LV E/e' medial:  11.7 LV IVS:        0.80 cm  LV e' lateral:   11.00 cm/s LVOT diam:     2.10 cm     LV E/e' lateral: 6.6 LV SV:         102 LV SV Index:   47 LVOT Area:     3.46 cm  LV Volumes (MOD) LV vol d, MOD A2C: 65.3 ml LV vol d, MOD A4C: 96.4 ml LV vol s, MOD A2C: 35.8 ml LV vol s, MOD A4C: 45.2 ml LV SV MOD A2C:     29.5 ml LV SV MOD A4C:     96.4 ml LV SV MOD BP:      41.7 ml RIGHT VENTRICLE             IVC RV S prime:     12.70 cm/s  IVC diam: 2.10 cm RVOT diam:      2.70 cm TAPSE (M-mode): 1.8 cm LEFT ATRIUM             Index        RIGHT ATRIUM           Index LA diam:        4.10 cm 1.91 cm/m   RA Area:     18.90 cm LA Vol (A2C):   59.8 ml 27.80 ml/m  RA Volume:   56.00 ml  26.03 ml/m LA Vol (A4C):   92.1 ml 42.81 ml/m LA Biplane Vol: 76.6 ml 35.60 ml/m  AORTIC VALVE                     PULMONIC VALVE AV Area (Vmax):    3.30 cm      PV Vmax:       0.63 m/s AV Area (Vmean):   3.19 cm      PV Peak grad:  1.6 mmHg AV Area (VTI):     3.57 cm AV Vmax:           152.00 cm/s AV Vmean:          114.000 cm/s AV VTI:            0.286 m AV Peak Grad:      9.2 mmHg AV Mean Grad:      6.0 mmHg LVOT Vmax:         145.00 cm/s LVOT Vmean:        105.000 cm/s LVOT VTI:          0.295 m LVOT/AV VTI ratio: 1.03  AORTA Ao Root diam: 3.10 cm Ao Asc diam:  3.00 cm MITRAL VALVE               TRICUSPID VALVE MV Area (PHT): 2.88 cm    TV Peak grad:   23.8 mmHg MV Area VTI:   1.14 cm    TV Mean grad:   16.0 mmHg MV Peak grad:  68.9 mmHg   TV Vmax:        2.44 m/s MV Mean grad:  41.0 mmHg   TV Vmean:       193.0 cm/s MV Vmax:       4.15 m/s    TV VTI:         0.66 msec MV Vmean:      287.0 cm/s  TR Peak grad:   23.6 mmHg MV Decel Time: 263 msec    TR Vmax:        243.00 cm/s MV E velocity: 72.80 cm/s MV A velocity: 75.00 cm/s  SHUNTS MV E/A ratio:  0.97        Systemic VTI:  0.30 m                             Systemic Diam: 2.10 cm                            Pulmonic Diam: 2.70 cm Lyman Bishop MD Electronically signed by Lyman Bishop MD Signature Date/Time: 08/31/2021/11:34:00 AM    Final         Scheduled Meds:  guaiFENesin  600 mg Oral BID   lidocaine  1 patch Transdermal Q24H   Continuous Infusions:  amiodarone 30 mg/hr (08/31/21 3557)   azithromycin Stopped (08/31/21 0203)   cefTRIAXone (ROCEPHIN)  IV Stopped (08/30/21 2348)   heparin 1,800 Units/hr (08/31/21 1250)     LOS: 1 day        Hosie Poisson, MD [facility]   To contact the attending provider between 7A-7P or the covering provider during after hours 7P-7A, please log into the web site [email or web address] and access using universal  password for that web site. If you do not have the password, please call the hospital operator.  08/31/2021, 2:00 PM

## 2021-09-01 ENCOUNTER — Other Ambulatory Visit (HOSPITAL_COMMUNITY): Payer: Self-pay

## 2021-09-01 ENCOUNTER — Encounter (HOSPITAL_COMMUNITY): Payer: Self-pay | Admitting: Internal Medicine

## 2021-09-01 DIAGNOSIS — I4892 Unspecified atrial flutter: Secondary | ICD-10-CM

## 2021-09-01 DIAGNOSIS — F1410 Cocaine abuse, uncomplicated: Secondary | ICD-10-CM

## 2021-09-01 DIAGNOSIS — R000 Tachycardia, unspecified: Secondary | ICD-10-CM

## 2021-09-01 LAB — BASIC METABOLIC PANEL
Anion gap: 16 — ABNORMAL HIGH (ref 5–15)
BUN: 22 mg/dL (ref 8–23)
CO2: 20 mmol/L — ABNORMAL LOW (ref 22–32)
Calcium: 7.7 mg/dL — ABNORMAL LOW (ref 8.9–10.3)
Chloride: 104 mmol/L (ref 98–111)
Creatinine, Ser: 1.61 mg/dL — ABNORMAL HIGH (ref 0.61–1.24)
GFR, Estimated: 47 mL/min — ABNORMAL LOW (ref >60)
Glucose, Bld: 117 mg/dL — ABNORMAL HIGH (ref 70–99)
Potassium: 3.8 mmol/L (ref 3.5–5.1)
Sodium: 140 mmol/L (ref 135–145)

## 2021-09-01 LAB — URINE CULTURE: Culture: NO GROWTH

## 2021-09-01 LAB — CBG MONITORING, ED: Glucose-Capillary: 100 mg/dL — ABNORMAL HIGH (ref 70–99)

## 2021-09-01 LAB — HEPARIN LEVEL (UNFRACTIONATED): Heparin Unfractionated: 0.39 [IU]/mL (ref 0.30–0.70)

## 2021-09-01 MED ORDER — METOPROLOL TARTRATE 25 MG PO TABS
25.0000 mg | ORAL_TABLET | Freq: Two times a day (BID) | ORAL | Status: DC
  Administered 2021-09-01 – 2021-09-04 (×6): 25 mg via ORAL
  Filled 2021-09-01 (×6): qty 1

## 2021-09-01 MED ORDER — APIXABAN 5 MG PO TABS
5.0000 mg | ORAL_TABLET | Freq: Two times a day (BID) | ORAL | Status: DC
  Administered 2021-09-01 – 2021-09-04 (×6): 5 mg via ORAL
  Filled 2021-09-01 (×6): qty 1

## 2021-09-01 NOTE — Progress Notes (Signed)
Todd [name] for Apixaban Indication: atrial fibrillation  No Known Allergies  Patient Measurements: Height: 5\' 9"  (175.3 cm) Weight: 99.8 kg (220 lb) IBW/kg (Calculated) : 70.7  Vital Signs: Temp: 99.6 F (37.6 C) (01/17 1612) Temp Source: Oral (01/17 1612) BP: 144/80 (01/17 1612) Pulse Rate: 72 (01/17 1400)  Labs: Recent Labs    08/30/21 1223 08/30/21 1358 08/30/21 1427 08/31/21 0241 08/31/21 0553 08/31/21 0959 08/31/21 2100 09/01/21 0943  HGB 11.3*  --  13.3  --  14.6  --   --   --   HCT 33.4*  --  39.0  --  45.3  --   --   --   PLT 127*  --   --   --  163  --   --   --   HEPARINUNFRC  --   --   --    < > <0.10* <0.10* 0.27* 0.39  CREATININE 1.35*  --   --   --  2.27*  --   --  1.61*  TROPONINIHS 36* 59*  --   --   --   --   --   --    < > = values in this interval not displayed.    Estimated Creatinine Clearance: 53.2 mL/min (A) (by C-G formula based on SCr of 1.61 mg/dL (H)).   Medical History: Past Medical History:  Diagnosis Date   Arthritis    "fingers" (07/14/2018)   Depression    DVT (deep venous thrombosis) (HCC) LLE   Hepatitis C     finished [facility] tx ~ 2017   Hypercholesterolemia    Hypertension    Prostate cancer ([place]) 53yrs ago   Pulmonary embolism and infarction ([place]) 07/13/2018   Sleep apnea    not currently using cpap, mask causing vertigo   Type II diabetes mellitus (HCC)     Medications:  Medications Prior to Admission  Medication Sig Dispense Refill Last Dose   acetaminophen (TYLENOL) 325 MG tablet Take 2 tablets (650 mg total) by mouth every 8 (eight) hours as needed for mild pain, fever or headache. (Patient not taking: Reported on 08/31/2021) 30 tablet 0 Not Taking   albuterol (PROVENTIL) (2.5 MG/3ML) 0.083% nebulizer solution Take 3 mLs (2.5 mg total) by nebulization every 2 (two) hours as needed for wheezing. (Patient not taking: Reported on 08/31/2021) 75 mL 12 Not Taking   amLODipine  (NORVASC) 5 MG tablet Take 1 tablet (5 mg total) by mouth daily. (Patient not taking: Reported on 08/31/2021) 30 tablet 0 Not Taking   aspirin EC 81 MG tablet Take 1 tablet (81 mg total) by mouth daily. Swallow whole. (Patient not taking: Reported on 08/31/2021) 30 tablet 0 Not Taking   clotrimazole (LOTRIMIN) 1 % cream Apply topically 2 (two) times daily. (Patient not taking: Reported on 08/31/2021) 30 g 0 Not Taking   colchicine 0.6 MG tablet Take 1 tablet (0.6 mg total) by mouth daily. (Patient not taking: Reported on 08/31/2021) 30 tablet 0 Not Taking   dextromethorphan-guaiFENesin (MUCINEX DM) 30-600 MG 12hr tablet Take 1 tablet by mouth 2 (two) times daily as needed for cough. (Patient not taking: Reported on 08/31/2021) 20 tablet 0 Not Taking   docusate sodium (COLACE) 100 MG capsule Take 1 capsule (100 mg total) by mouth 2 (two) times daily. (Patient not taking: Reported on 08/31/2021) 10 capsule 0 Not Taking   folic acid (FOLVITE) 1 MG tablet Take 1 tablet (1 mg total) by mouth daily. (Patient  not taking: Reported on 08/31/2021) 30 tablet 0 Not Taking   metoprolol tartrate (LOPRESSOR) 25 MG tablet Take 1 tablet (25 mg total) by mouth 2 (two) times daily. (Patient not taking: Reported on 08/31/2021) 60 tablet 0 Not Taking   Multiple Vitamin (MULTIVITAMIN WITH MINERALS) TABS tablet Take 1 tablet by mouth daily. (Patient not taking: Reported on 08/31/2021) 30 tablet 0 Not Taking   pantoprazole (PROTONIX) 40 MG tablet Take 1 tablet (40 mg total) by mouth at bedtime. (Patient not taking: Reported on 08/31/2021) 30 tablet 0 Not Taking   polyethylene glycol (MIRALAX / GLYCOLAX) 17 g packet Take 17 g by mouth daily. (Patient not taking: Reported on 08/31/2021) 14 each 0 Not Taking   sertraline (ZOLOFT) 25 MG tablet Take 1 tablet (25 mg total) by mouth daily. (Patient not taking: Reported on 08/31/2021) 30 tablet 0 Not Taking   sucralfate (CARAFATE) 1 GM/10ML suspension Take 10 mLs (1 g total) by mouth 4 (four)  times daily -  with meals and at bedtime. (Patient not taking: Reported on 08/31/2021) 1200 mL 0 Not Taking   thiamine 100 MG tablet Take 1 tablet (100 mg total) by mouth daily. (Patient not taking: Reported on 08/31/2021) 30 tablet 0 Not Taking    Scheduled:   guaiFENesin  600 mg Oral BID   lidocaine  1 patch Transdermal Q24H   metoprolol tartrate  25 mg Oral BID   Infusions:   azithromycin Stopped (08/31/21 2227)   cefTRIAXone (ROCEPHIN)  IV Stopped (08/31/21 2318)   heparin 2,000 Units/hr (09/01/21 1451)   lactated ringers 75 mL/hr at 09/01/21 1623   PRN: acetaminophen, HYDROcodone-acetaminophen, ipratropium, levalbuterol, traMADol  Assessment: 33 yom with a history of diabetes, PE/DVT, hypertension, prior opiate abuse . Patient is presenting with SOB. Initially, pharmacy was consulted for IV Heparin dosing for atrial fibrillation. Now consulted to change IV Heparin to Apixaban therapy.   Heparin has been therapeutic. CBC has been stable. SCr is improving at 1.61. LFTs were normal on admission. No bleeding reported.  Age 66 yo, wt >60kg, and SCr <1.5.   Goal of Therapy:  Heparin level 0.3-0.7 units/ml Monitor platelets by anticoagulation protocol: Yes   Plan:  Start Apixaban 5mg  po BID.  Discontinue IV Heparin at the same time the first dose of Apixaban is given.  Monitor for signs/symptoms of bleed  Thank you for allowing pharmacy to be a part of this patients care.  Sloan Leiter, PharmD, BCPS, BCCCP Clinical Pharmacist Please refer to [facility] for [place] numbers 09/01/2021 5:48 PM

## 2021-09-01 NOTE — Consult Note (Addendum)
Cardiology Consultation:   Patient ID: Todd [name]. MRN: [id number]; DOB: 02-Jan-1956  Admit date: 08/30/2021 Date of Consult: 09/01/2021  PCP:  [name], MD   [place] Providers Cardiologist:  Shelva Majestic, MD   {  Patient Profile:   Todd [name]. is a 66 y.o. male with a hx of  HTN, RBBB, ETOH/tobacco/heroin/cocaine abuse, hx of PE, DVT, HLD and prostate cancer  who is being seen 09/01/2021 for the evaluation of atrial flutter at the request of Dr. Cyd Silence.  Seen by Dr. Claiborne Billings when admitted 08/2020  for elevated troponin after found unresponsive and apneic with foaming at the mouth which responded to Narcan and had a seizure after Narcan lasting approximately 1 minute. Felt finding consistent with rhabdomyolysis and demand ischemia. Echo with LVEF 60-65%, no WMAs, Grade I diast dysfunction.   History of Present Illness:   Todd [name] presented to [facility] emergency room January 15 for evaluation of worsening shortness of breath.  CT angiogram without pulmonary embolism.  Has suspected left-sided pneumonia and treated with broad-spectrum antibiotic.  Also found of UTI and left 3rd rib fracture with hematoma.  Patient noted tachycardic upon arrival with heart rate in 150s.  Case discussed with on-call cardiology Dr. Rodman Key who felt patient has 2-1 atrial flutter.  Initial plan was to give verapamil but given hypotension he was started on IV amiodarone with improved heart rate.  Started on IV heparin for anticoagulation.  Urine drug screen was positive for cocaine.  Patient has remained on IV amiodarone.  Cardiology is asked for further evaluation  Scr 1.35>>2.27>>1.61 TSH normal K 3.8  Patient reported significant left-sided chest pain during evaluation.  His pain is reproducible with palpation and deep breath.  Reports half pack of tobacco smoking.   Echo 08/31/2020  1. Left ventricular ejection fraction, by estimation, is 60 to 65%. The  left ventricle has  normal function. The left ventricle has no regional  wall motion abnormalities. There is mild left ventricular hypertrophy.  Left ventricular diastolic parameters  are consistent with Grade I diastolic dysfunction (impaired relaxation).   2. Right ventricular systolic function is normal. The right ventricular  size is normal. The estimated right ventricular systolic pressure is 56.2  mmHg.   3. Left atrial size was moderately dilated.   4. The mitral valve is abnormal. Trivial mitral valve regurgitation.   5. The aortic valve is tricuspid. Aortic valve regurgitation is not  visualized.   6. The inferior vena cava is dilated in size with >50% respiratory  variability, suggesting right atrial pressure of 8 mmHg.   7. Rhythm strip during this exam demostrated normal sinus rhythm.   Comparison(s): No significant change from prior study. 09/05/2020: LVEF  60-65%, grade 1 DD, mild MR. Past Medical History:  Diagnosis Date   Arthritis    "fingers" (07/14/2018)   Depression    DVT (deep venous thrombosis) (HCC) LLE   Hepatitis C     finished harvoni tx ~ 2017   Hypercholesterolemia    Hypertension    Prostate cancer ([place]) 30yrs ago   Pulmonary embolism and infarction ([place]) 07/13/2018   Sleep apnea    not currently using cpap, mask causing vertigo   Type II diabetes mellitus ([place])     Past Surgical History:  Procedure Laterality Date   BIOPSY  01/12/2019   Procedure: BIOPSY;  Surgeon: Ronald Lobo, MD;  Location: [place];  Service: Endoscopy;;   BIOPSY  06/23/2019   Procedure: BIOPSY;  Surgeon:  Arta Silence, MD;  Location: [place];  Service: Endoscopy;;   COLONOSCOPY WITH PROPOFOL N/A 02/19/2014   Procedure: COLONOSCOPY WITH PROPOFOL;  Surgeon: Garlan Fair, MD;  Location: [facility] ENDOSCOPY;  Service: Endoscopy;  Laterality: N/A;   ENDOVENOUS ABLATION SAPHENOUS VEIN W/ LASER Left 11/22/2017   endovenous laser ablation L SSV by Tinnie Gens MD    ESOPHAGEAL BRUSHING   06/23/2019   Procedure: ESOPHAGEAL BRUSHING;  Surgeon: Arta Silence, MD;  Location: [place];  Service: Endoscopy;;   ESOPHAGOGASTRODUODENOSCOPY (EGD) WITH PROPOFOL N/A 01/12/2019   Procedure: ESOPHAGOGASTRODUODENOSCOPY (EGD) WITH PROPOFOL;  Surgeon: Ronald Lobo, MD;  Location: [place];  Service: Endoscopy;  Laterality: N/A;  Patient is also scheduled for barium swallow; please notify radiology after patient's EGD is complete so that barium swallow follows the endoscopy, not vice versa   ESOPHAGOGASTRODUODENOSCOPY (EGD) WITH PROPOFOL N/A 06/23/2019   Procedure: ESOPHAGOGASTRODUODENOSCOPY (EGD) WITH PROPOFOL;  Surgeon: Arta Silence, MD;  Location: [facility] ENDOSCOPY;  Service: Endoscopy;  Laterality: N/A;   PROSTATECTOMY  2008   REPAIR QUADRICEPS / HAMSTRING MUSCLE Right     Inpatient Medications: Scheduled Meds:  guaiFENesin  600 mg Oral BID   lidocaine  1 patch Transdermal Q24H   Continuous Infusions:  amiodarone 30 mg/hr (09/01/21 1451)   azithromycin Stopped (08/31/21 2227)   cefTRIAXone (ROCEPHIN)  IV Stopped (08/31/21 2318)   heparin 2,000 Units/hr (09/01/21 1451)   lactated ringers 75 mL/hr at 08/31/21 2057   PRN Meds: acetaminophen, HYDROcodone-acetaminophen, ipratropium, levalbuterol, traMADol  Allergies:   No Known Allergies  Social History:   Social History   Socioeconomic History   Marital status: Single    Spouse name: Not on file   Number of children: 2   Years of education: 12th   Highest education level: Not on file  Occupational History   Occupation: post office  Tobacco Use   Smoking status: Every Day    Packs/day: 0.12    Years: 45.00    Pack years: 5.40    Types: Cigarettes   Smokeless tobacco: Never  Vaping Use   Vaping Use: Never used  Substance and Sexual Activity   Alcohol use: Yes   Drug use: Yes   Sexual activity: Not Currently  Other Topics Concern   Not on file  Social History Narrative   ** Merged History Encounter **        Patient lives at home alone.Marland KitchenMarland KitchenDrinks coffee daily    Social Determinants of Radio broadcast assistant Strain: Not on file  Food Insecurity: Not on file  Transportation Needs: Not on file  Physical Activity: Not on file  Stress: Not on file  Social Connections: Not on file  Intimate Partner Violence: Not on file    Family History:   Family History  Problem Relation Age of Onset   Cancer Father        PROSTATE     ROS:  Please see the history of present illness.  All other ROS reviewed and negative.     Physical Exam/Data:   Vitals:   09/01/21 1300 09/01/21 1305 09/01/21 1329 09/01/21 1400  BP: (!) 145/86 (!) 143/95 (!) 143/92 (!) 136/99  Pulse: (!) 56 69 69 72  Resp: (!) 23 (!) 23 20 20   Temp:   99 F (37.2 C)   TempSrc:   Oral   SpO2: 100% 97% 99% 93%  Weight:      Height:        Intake/Output Summary (Last 24 hours) at 09/01/2021  [place] filed at 09/01/2021 0900 Gross per 24 hour  Intake 488.94 ml  Output 1100 ml  Net -611.06 ml   Last 3 Weights 08/30/2021 10/07/2020 10/02/2020  Weight (lbs) 220 lb 179 lb 3.7 oz 191 lb 2.2 oz  Weight (kg) 99.791 kg 81.3 kg 86.7 kg  Some encounter information is confidential and restricted. Go to Review Flowsheets activity to see all data.     Body mass index is 32.49 kg/m.  General:  Well nourished, well developed, in no acute distress HEENT: normal Neck: no JVD Vascular: No carotid bruits; Distal pulses 2+ bilaterally Cardiac:  normal S1, S2; RRR; no murmur  Lungs:  clear to auscultation bilaterally, no wheezing, rhonchi or rales  Abd: soft, nontender, no hepatomegaly  Ext: no edema Musculoskeletal:  No deformities, BUE and BLE strength normal and equal Skin: warm and dry  Neuro:  CNs 2-12 intact, no focal abnormalities noted Psych:  Normal affect   EKG:  The EKG was personally reviewed and demonstrates: Sinus rhythm, right bundle branch block Telemetry:  Telemetry was personally reviewed and demonstrates: Sinus  rhythm at controlled rate  Relevant CV Studies:  Echo 09/05/2020 1. Left ventricular ejection fraction, by estimation, is 60 to 65%. The  left ventricle has normal function. Left ventricular diastolic parameters  are consistent with Grade I diastolic dysfunction (impaired relaxation).   2. The mitral valve is normal in structure. Mild mitral valve  regurgitation. No evidence of mitral stenosis.   3. The aortic valve is tricuspid. Aortic valve regurgitation is not  visualized. No aortic stenosis is present.   4. There is normal pulmonary artery systolic pressure.   5. The inferior vena cava is dilated in size with >50% respiratory  variability, suggesting right atrial pressure of 8 mmHg.   Laboratory Data:  High Sensitivity Troponin:   Recent Labs  Lab 08/30/21 1223 08/30/21 1358  TROPONINIHS 36* 59*     Chemistry Recent Labs  Lab 08/30/21 1223 08/30/21 1427 08/31/21 0553 09/01/21 0943  NA 127* 129* 129* 140  K 4.8 4.7 4.7 3.8  CL 100  --  103 104  CO2 13*  --  19* 20*  GLUCOSE 98  --  94 117*  BUN 26*  --  40* 22  CREATININE 1.35*  --  2.27* 1.61*  CALCIUM 7.3*  --  8.0* 7.7*  GFRNONAA 58*  --  31* 47*  ANIONGAP 14  --  7 16*    Recent Labs  Lab 08/30/21 1223  PROT 4.0*  ALBUMIN 1.6*  AST 13*  ALT 9  ALKPHOS 30*  BILITOT 0.8   Lipids No results for input(s): CHOL, TRIG, HDL, LABVLDL, LDLCALC, CHOLHDL in the last 168 hours.  Hematology Recent Labs  Lab 08/30/21 1223 08/30/21 1427 08/31/21 0553  WBC 9.4  --  9.4  RBC 3.48*  --  4.73  HGB 11.3* 13.3 14.6  HCT 33.4* 39.0 45.3  MCV 96.0  --  95.8  MCH 32.5  --  30.9  MCHC 33.8  --  32.2  RDW 12.7  --  13.0  PLT 127*  --  163   Thyroid  Recent Labs  Lab 08/31/21 0553  TSH 1.452    BNPNo results for input(s): BNP, PROBNP in the last 168 hours.  DDimer No results for input(s): DDIMER in the last 168 hours.   Radiology/Studies:  CT Angio Chest PE W and/or Wo Contrast  Result Date:  08/30/2021 CLINICAL DATA:  Pulmonary embolism (PE) suspected,  high prob. Left rib pain EXAM: CT ANGIOGRAPHY CHEST WITH CONTRAST TECHNIQUE: Multidetector CT imaging of the chest was performed using the standard protocol during bolus administration of intravenous contrast. Multiplanar CT image reconstructions and MIPs were obtained to evaluate the vascular anatomy. RADIATION DOSE REDUCTION: This exam was performed according to the departmental dose-optimization program which includes automated exposure control, adjustment of the mA and/or kV according to patient size and/or use of iterative reconstruction technique. CONTRAST:  116mL OMNIPAQUE IOHEXOL 350 MG/ML SOLN COMPARISON:  CT 09/08/2018, 12/03/2009 FINDINGS: Cardiovascular: Satisfactory opacification of the pulmonary arteries to the segmental level. No evidence of pulmonary embolism. Pulmonary trunk measures 3.6 cm in diameter. Ascending thoracic aorta measures up to 3.9 cm in diameter, stable. Scattered atherosclerotic calcification of the aorta and coronary arteries. Normal heart size. No pericardial effusion. Mediastinum/Nodes: No enlarged mediastinal, hilar, or axillary lymph nodes. Thyroid gland and trachea demonstrate no significant findings. Mild circumferential wall thickening of the distal esophagus. Lungs/Pleura: Interlobular septal thickening within the lower lobes, more pronounced on the left with there is mild hazy ground-glass opacity. Subpleural reticulation within the upper lobes. Minimal centrilobular and paraseptal emphysema. New 4 mm noncalcified pulmonary nodule in the right lower lobe (series 6, image 97). No pleural effusion or pneumothorax. Upper Abdomen: Reflux of contrast into the IVC and hepatic veins. No acute findings within the visualized upper abdomen. Unchanged 2.3 cm right adrenal gland nodule, stable since 2011 and benign. Musculoskeletal: New mild superior endplate compression fractures of T4 and T5. No traumatic listhesis. Acute  mildly displaced fracture of the left third rib laterally with adjacent soft tissue stranding and ill-defined hematoma (series 5, image 47). Multiple additional healed nondisplaced bilateral rib fractures. Review of the MIP images confirms the above findings. IMPRESSION: 1. No evidence of acute pulmonary embolism. 2. Acute mildly displaced fracture of the left third rib laterally with adjacent soft tissue stranding and ill-defined hematoma. 3. New mild superior endplate compression fractures of T4 and T5. 4. Interlobular septal thickening within the lower lobes, more pronounced on the left with there is mild hazy ground-glass opacity. Findings may represent mild edema versus atypical/viral infection. 5. Mild circumferential wall thickening of the distal esophagus, which may represent esophagitis. 6. Reflux of contrast into the IVC and hepatic veins, suggesting right heart dysfunction. 7. New 4 mm noncalcified pulmonary nodule in the right lower lobe. No follow-up needed if patient is low-risk. Non-contrast chest CT can be considered in 12 months if patient is high-risk. This recommendation follows the consensus statement: Guidelines for Management of Incidental Pulmonary Nodules Detected on CT Images: From the Fleischner Society 2017; Radiology 2017; 284:228-243. Aortic and coronary artery atherosclerosis (ICD10-I70.0). Electronically Signed   By: Davina Poke D.O.   On: 08/30/2021 15:19   CT Abdomen Pelvis W Contrast  Result Date: 08/30/2021 CLINICAL DATA:  Blunt abdominal trauma. Pain in the left rib cage and left upper abdomen. EXAM: CT ABDOMEN AND PELVIS WITH CONTRAST TECHNIQUE: Multidetector CT imaging of the abdomen and pelvis was performed using the standard protocol following bolus administration of intravenous contrast. RADIATION DOSE REDUCTION: This exam was performed according to the departmental dose-optimization program which includes automated exposure control, adjustment of the mA and/or kV  according to patient size and/or use of iterative reconstruction technique. CONTRAST:  122mL OMNIPAQUE IOHEXOL 350 MG/ML SOLN, 71mL OMNIPAQUE IOHEXOL 300 MG/ML SOLN COMPARISON:  June 19, 2019 FINDINGS: Lower chest: Healed or healing left eleventh rib fracture. No acute findings. Hepatobiliary: No focal liver abnormality is seen. Status  post cholecystectomy. No biliary dilatation. Pancreas: Unremarkable. No pancreatic ductal dilatation or surrounding inflammatory changes. Spleen: No splenic injury or perisplenic hematoma. Adrenals/Urinary Tract: Stable in size 2.3 cm right adrenal mass. Bilateral renal cysts, as well as some hypoattenuated masses in bilateral kidneys which are too small to be actually characterize by CT. Stomach/Bowel: Small hiatal hernia. Stomach is within normal limits. Appendix appears normal. No evidence of bowel wall thickening, distention, or inflammatory changes. Scattered left colonic diverticulosis without evidence of diverticulitis. Vascular/Lymphatic: No significant vascular findings are present. No enlarged abdominal or pelvic lymph nodes. Reproductive: Surgically absent prostate gland. Other: Anterior abdominal wall repair. Musculoskeletal: No acute fracture is seen. IMPRESSION: 1. No evidence of acute traumatic injury to the abdomen or pelvis. 2. Healed or healing left eleventh rib fracture. 3. Bilateral renal cysts, as well as some hypoattenuated masses in bilateral kidneys which are too small to be actually characterize by CT. 4. Small hiatal hernia. 5. Stable in size 2.3 cm right adrenal mass, likely benign. Electronically Signed   By: Fidela Salisbury M.D.   On: 08/30/2021 16:49   DG Chest Port 1 View  Result Date: 08/30/2021 CLINICAL DATA:  Short of breath.  Left-sided chest wall pain. EXAM: PORTABLE CHEST 1 VIEW COMPARISON:  10/07/2020. FINDINGS: Cardiac silhouette is normal in size. No mediastinal or hilar masses. Chronic interstitial thickening, most evident in the  left mid and lower lung, stable. No evidence of pneumonia or pulmonary edema. No convincing pleural effusion and no pneumothorax. Skeletal structures are grossly intact. IMPRESSION: No acute cardiopulmonary disease. Electronically Signed   By: Lajean Manes M.D.   On: 08/30/2021 12:21   ECHOCARDIOGRAM COMPLETE  Result Date: 08/31/2021    ECHOCARDIOGRAM REPORT   Patient Name:   Todd [name]. Date of Exam: 08/31/2021 Medical Rec #:  [id number]          Height:       69.0 in Accession #:    [id number]         Weight:       220.0 lb Date of Birth:  [birth year]          BSA:          2.151 m Patient Age:    23 years           BP:           104/75 mmHg Patient Gender: M                  HR:           80 bpm. Exam Location:  Inpatient Procedure: 2D Echo, Cardiac Doppler and Color Doppler Indications:    AFLUTTER  History:        Patient has prior history of Echocardiogram examinations, most                 recent 09/05/2020. Risk Factors:Hypertension and Diabetes. PULM.                 EMBOLI / HYPERCHOLESTEROLEMIA.  Sonographer:    Beryle Beams Referring Phys: [id number] [place] T TU IMPRESSIONS  1. Left ventricular ejection fraction, by estimation, is 60 to 65%. The left ventricle has normal function. The left ventricle has no regional wall motion abnormalities. There is mild left ventricular hypertrophy. Left ventricular diastolic parameters are consistent with Grade I diastolic dysfunction (impaired relaxation).  2. Right ventricular systolic function is normal. The right ventricular size is normal. The estimated right ventricular systolic pressure is 31.6  mmHg.  3. Left atrial size was moderately dilated.  4. The mitral valve is abnormal. Trivial mitral valve regurgitation.  5. The aortic valve is tricuspid. Aortic valve regurgitation is not visualized.  6. The inferior vena cava is dilated in size with >50% respiratory variability, suggesting right atrial pressure of 8 mmHg.  7. Rhythm strip during this exam  demostrated normal sinus rhythm. Comparison(s): No significant change from prior study. 09/05/2020: LVEF 60-65%, grade 1 DD, mild MR. FINDINGS  Left Ventricle: Left ventricular ejection fraction, by estimation, is 60 to 65%. The left ventricle has normal function. The left ventricle has no regional wall motion abnormalities. The left ventricular internal cavity size was normal in size. There is  mild left ventricular hypertrophy. Left ventricular diastolic parameters are consistent with Grade I diastolic dysfunction (impaired relaxation). Indeterminate filling pressures. Right Ventricle: The right ventricular size is normal. No increase in right ventricular wall thickness. Right ventricular systolic function is normal. The tricuspid regurgitant velocity is 2.43 m/s, and with an assumed right atrial pressure of 8 mmHg, the estimated right ventricular systolic pressure is 60.1 mmHg. Left Atrium: Left atrial size was moderately dilated. Right Atrium: Right atrial size was normal in size. Pericardium: There is no evidence of pericardial effusion. Mitral Valve: The mitral valve is abnormal. There is mild thickening of the mitral valve leaflet(s). Trivial mitral valve regurgitation. MV peak gradient, 68.9 mmHg. The mean mitral valve gradient is 41.0 mmHg. Tricuspid Valve: The tricuspid valve is grossly normal. Tricuspid valve regurgitation is trivial. Aortic Valve: The aortic valve is tricuspid. Aortic valve regurgitation is not visualized. Aortic valve mean gradient measures 6.0 mmHg. Aortic valve peak gradient measures 9.2 mmHg. Aortic valve area, by VTI measures 3.57 cm. Pulmonic Valve: The pulmonic valve was normal in structure. Pulmonic valve regurgitation is not visualized. Aorta: The aortic root and ascending aorta are structurally normal, with no evidence of dilitation. Venous: The inferior vena cava is dilated in size with greater than 50% respiratory variability, suggesting right atrial pressure of 8 mmHg.  IAS/Shunts: No atrial level shunt detected by color flow Doppler. EKG: Rhythm strip during this exam demostrated normal sinus rhythm.  LEFT VENTRICLE PLAX 2D LVIDd:         4.40 cm     Diastology LVIDs:         2.80 cm     LV e' medial:    6.20 cm/s LV PW:         1.60 cm     LV E/e' medial:  11.7 LV IVS:        0.80 cm     LV e' lateral:   11.00 cm/s LVOT diam:     2.10 cm     LV E/e' lateral: 6.6 LV SV:         102 LV SV Index:   47 LVOT Area:     3.46 cm  LV Volumes (MOD) LV vol d, MOD A2C: 65.3 ml LV vol d, MOD A4C: 96.4 ml LV vol s, MOD A2C: 35.8 ml LV vol s, MOD A4C: 45.2 ml LV SV MOD A2C:     29.5 ml LV SV MOD A4C:     96.4 ml LV SV MOD BP:      41.7 ml RIGHT VENTRICLE             IVC RV S prime:     12.70 cm/s  IVC diam: 2.10 cm RVOT diam:      2.70 cm TAPSE (M-mode):  1.8 cm LEFT ATRIUM             Index        RIGHT ATRIUM           Index LA diam:        4.10 cm 1.91 cm/m   RA Area:     18.90 cm LA Vol (A2C):   59.8 ml 27.80 ml/m  RA Volume:   56.00 ml  26.03 ml/m LA Vol (A4C):   92.1 ml 42.81 ml/m LA Biplane Vol: 76.6 ml 35.60 ml/m  AORTIC VALVE                     PULMONIC VALVE AV Area (Vmax):    3.30 cm      PV Vmax:       0.63 m/s AV Area (Vmean):   3.19 cm      PV Peak grad:  1.6 mmHg AV Area (VTI):     3.57 cm AV Vmax:           152.00 cm/s AV Vmean:          114.000 cm/s AV VTI:            0.286 m AV Peak Grad:      9.2 mmHg AV Mean Grad:      6.0 mmHg LVOT Vmax:         145.00 cm/s LVOT Vmean:        105.000 cm/s LVOT VTI:          0.295 m LVOT/AV VTI ratio: 1.03  AORTA Ao Root diam: 3.10 cm Ao Asc diam:  3.00 cm MITRAL VALVE               TRICUSPID VALVE MV Area (PHT): 2.88 cm    TV Peak grad:   23.8 mmHg MV Area VTI:   1.14 cm    TV Mean grad:   16.0 mmHg MV Peak grad:  68.9 mmHg   TV Vmax:        2.44 m/s MV Mean grad:  41.0 mmHg   TV Vmean:       193.0 cm/s MV Vmax:       4.15 m/s    TV VTI:         0.66 msec MV Vmean:      287.0 cm/s  TR Peak grad:   23.6 mmHg MV Decel Time: 263  msec    TR Vmax:        243.00 cm/s MV E velocity: 72.80 cm/s MV A velocity: 75.00 cm/s  SHUNTS MV E/A ratio:  0.97        Systemic VTI:  0.30 m                            Systemic Diam: 2.10 cm                            Pulmonic Diam: 2.70 cm Todd Bishop MD Electronically signed by Todd Bishop MD Signature Date/Time: 08/31/2021/11:34:00 AM    Final      Assessment and Plan:   New onset atrial flutter with rapid ventricular rate -Patient was noted in 2-1 atrial flutter with rapid ventricular rate in 150s up on arrival on 1/15.  On-call cardiology recommended IV amiodarone due to soft blood pressure.  Patient was spontaneously converted and maintaining sinus rhythm for past  36 hours or so.   -TSH normal -Echocardiogram showed preserved LV function without valvular abnormality -Patient is unaware of rapid heart rate.  Current episode likely induced by UTI, and possible pneumonia. -Currently on IV heparin for anticoagulation and IV amiodarone -Will change IV amiodarone  -Anticoagulation anticoagulation as below   2.  Left-sided chest pain -Patient reporting left-sided chest pain upon entering the room.  This is ongoing since admission.  Her chest pain reproducible with palpation.  Worse with deep breath.  He was found to have mildly displaced fracture at the third rib with ill-defined hematoma.  He is anticoagulated with heparin since then. -His chest pain not consistent with angina -More musculoskeletal and pleuritic in nature.  Recommended management per primary team.  Questionable needs repeat CT/chest x-ray to rule out worsening hematoma as being on anticoagulation.  3.  Polysubstance abuse -Recommended cessation  4. Hx of DM -Not currently on any medication.  Last hemoglobin A1c checked was 5.5 in January 2022.  Will recheck.  5.  AKI - Scr 1.35>>2.27>>1.61 -Avoid nephrotoxic agent  6.  Hypertension -Initially hypotensive on arrival.  Blood pressure has improved, now in 140s/90s  range. - Per primary team   Dr. Gwenlyn Found to see.   Risk Assessment/Risk Scores:   CHA2DS2-VASc Score = 5  The patient's score is based upon: CHF History: 0 HTN History: 1 Diabetes History: 1 (not currently on meds) Stroke History: 2 (hx of PE and DVT) Vascular Disease History: 0 Age Score: 1 Gender Score: 0   For questions or updates, please contact [place] Please consult [email or web address] for contact info under    Jarrett Soho, PA  09/01/2021 2:57 PM   Agree with note by Robbie Lis PA-C  Asked to evaluate patient with a flutter with 2-1 block.  He has since converted to sinus rhythm on IV amiodarone.  He has normal LV function by 2D echo.  His UDS was positive for cocaine which he endorses taking every other day.  His exam is benign.  I believe he would benefit from being on an oral anticoagulant such as Eliquis as well as a beta-blocker.  I do not think he needs to be on long-term amiodarone.  I question his future compliance with medical therapy.  No further evaluation is necessary at this time.  We will sign off.   Lorretta Harp, M.D., [place], [facility], [place] [address]. [place], [place]  [address]  [phone] 09/01/2021 3:35 PM

## 2021-09-01 NOTE — ED Notes (Signed)
Placed Breakfast Order 

## 2021-09-01 NOTE — ED Notes (Signed)
Lunch Ordered °

## 2021-09-01 NOTE — Progress Notes (Signed)
PROGRESS NOTE    Todd [name].  OFB:[id number] DOB: Apr 11, 1956 DOA: 08/30/2021 PCP: [name], MD   Chief Complaint  Patient presents with   Shortness of Breath     Brief Narrative:  Todd [name]. is a 66 y.o. male with medical history significant for hypertension, PE/DVT, type 2 diabetes, polysubstance abuse who presents with concerns of increasing shortness of breath. CTA chest was negative for PE, had mildly displaced fracture of the left third rib, new superior endplate compression fracture of T4 and T5, there is also pronounced left-sided groundglass opacity. UDS is positive for cocaine.  Cardiology consulted for atrial fibrillation with RVR.   Assessment & Plan:   Principal Problem:   Tachycardia Active Problems:   AKI (acute kidney injury) ([place])   Atrial flutter (HCC)   Acute lower UTI   Community acquired pneumonia   Hyponatremia   Rib fracture   Compression fracture of body of thoracic vertebra (HCC)   Pulmonary nodule   Atrial flutter with rapid ventricular response (HCC)   Atrial flutter with intermittent hypotension:  - rate controlled on IV amiodarone and IV heparin. Cardiology consulted by EDP/admitting physician.  - transitioned off IV amiodarone and oral eliquis.  Started on metoprolol 25 mg BID.  TSH wnl.  Echocardiogram showed Left ventricular ejection fraction, by estimation, is 60 to 65%, has normal function. The left ventricle has no regional  wall motion abnormalities. There is mild left ventricular hypertrophy.  Left ventricular diastolic parameters  are consistent with Grade I diastolic dysfunction (impaired relaxation).  LEFT third rib fracture with hematoma and T4-T5 Mild sup end plate compression fracture:  Pain control, incentive spirometry.  Physical  therapy evaluation.    UTI Urine cultures to be sent, on IV antibiotics.   Suspected left sided pneumonia:  Resume IV antibiotics.  Follow up blood cultures.  Sputum  cultures if able.    New 4 mm noncalcified pulmonary nodule in the right lower lobe. Non-contrast chest CT can be considered in 12 months if patient is high-risk   AKI:  Probably from dehydration.  No hydronephrosis on CT abd and pelvis.  Much improved with IV hydration.    Polysubstance abuse:  TOC will be consulted for resources.     Hyponatremia:  From dehydration.   Mildly elevated troponin's probably from the cocaine use.   DVT prophylaxis: Heparin.  Code Status: Full code.  Family Communication: none at bedside.  Disposition:   Status is: Inpatient  Remains inpatient appropriate because: IV heparin.        Consultants:  Cardiology   Procedures: echocardiogram.   Antimicrobials: rocephin or zithromax.   Subjective: Pain better controlled.   Objective: Vitals:   09/01/21 1245 09/01/21 1255 09/01/21 1300 09/01/21 1305  BP: (!) 142/81 (!) 141/93 (!) 145/86 (!) 143/95  Pulse: 71 67 (!) 56 69  Resp: 19 19 (!) 23 (!) 23  Temp:      TempSrc:      SpO2: 95% 98% 100% 97%  Weight:      Height:        Intake/Output Summary (Last 24 hours) at 09/01/2021 1345 Last data filed at 09/01/2021 0900 Gross per 24 hour  Intake 488.94 ml  Output 1100 ml  Net -611.06 ml    Filed Weights   08/30/21 1210  Weight: 99.8 kg    Examination:  General exam: Appears calm and comfortable  Respiratory system: Clear to auscultation. Respiratory effort normal. Cardiovascular system: S1 & S2 heard, RRR. No  JVD, . No pedal edema. Gastrointestinal system: Abdomen is nondistended, soft and nontender. Normal bowel sounds heard. Central nervous system: Alert and oriented. No focal neurological deficits. Extremities: Symmetric 5 x 5 power. Skin: No rashes, lesions or ulcers Psychiatry: Mood & affect appropriate.      Data Reviewed: I have personally reviewed following labs and imaging studies  CBC: Recent Labs  Lab 08/30/21 1223 08/30/21 1427 08/31/21 0553  WBC  9.4  --  9.4  NEUTROABS 7.5  --   --   HGB 11.3* 13.3 14.6  HCT 33.4* 39.0 45.3  MCV 96.0  --  95.8  PLT 127*  --  163     Basic Metabolic Panel: Recent Labs  Lab 08/30/21 1223 08/30/21 1427 08/31/21 0553 09/01/21 0943  NA 127* 129* 129* 140  K 4.8 4.7 4.7 3.8  CL 100  --  103 104  CO2 13*  --  19* 20*  GLUCOSE 98  --  94 117*  BUN 26*  --  40* 22  CREATININE 1.35*  --  2.27* 1.61*  CALCIUM 7.3*  --  8.0* 7.7*     GFR: Estimated Creatinine Clearance: 53.2 mL/min (A) (by C-G formula based on SCr of 1.61 mg/dL (H)).  Liver Function Tests: Recent Labs  Lab 08/30/21 1223  AST 13*  ALT 9  ALKPHOS 30*  BILITOT 0.8  PROT 4.0*  ALBUMIN 1.6*     CBG: Recent Labs  Lab 08/31/21 1956 08/31/21 2200 09/01/21 0226  GLUCAP 141* 235* 100*     Recent Results (from the past 240 hour(s))  Resp Panel by RT-PCR (Flu A&B, Covid) Nasopharyngeal Swab     Status: None   Collection Time: 08/30/21  2:23 PM   Specimen: Nasopharyngeal Swab; Nasopharyngeal(NP) swabs in vial transport medium  Result Value Ref Range Status   SARS Coronavirus 2 by RT PCR NEGATIVE NEGATIVE Final    Comment: (NOTE) SARS-CoV-2 target nucleic acids are NOT DETECTED.  The SARS-CoV-2 RNA is generally detectable in upper respiratory specimens during the acute phase of infection. The lowest concentration of SARS-CoV-2 viral copies this assay can detect is 138 copies/mL. A negative result does not preclude SARS-Cov-2 infection and should not be used as the sole basis for treatment or other patient management decisions. A negative result may occur with  improper specimen collection/handling, submission of specimen other than nasopharyngeal swab, presence of viral mutation(s) within the areas targeted by this assay, and inadequate number of viral copies(<138 copies/mL). A negative result must be combined with clinical observations, patient history, and epidemiological information. The expected result is  Negative.  Fact Sheet for Patients:  EntrepreneurPulse.com.au  Fact Sheet for Healthcare Providers:  IncredibleEmployment.be  This test is no t yet approved or cleared by the [place] FDA and  has been authorized for detection and/or diagnosis of SARS-CoV-2 by FDA under an Emergency Use Authorization (EUA). This EUA will remain  in effect (meaning this test can be used) for the duration of the COVID-19 declaration under Section 564(b)(1) of the Act, 21 U.S.C.section 360bbb-3(b)(1), unless the authorization is terminated  or revoked sooner.       Influenza A by PCR NEGATIVE NEGATIVE Final   Influenza B by PCR NEGATIVE NEGATIVE Final    Comment: (NOTE) The Xpert Xpress SARS-CoV-2/FLU/RSV plus assay is intended as an aid in the diagnosis of influenza from Nasopharyngeal swab specimens and should not be used as a sole basis for treatment. Nasal washings and aspirates are unacceptable for Xpert Xpress  SARS-CoV-2/FLU/RSV testing.  Fact Sheet for Patients: EntrepreneurPulse.com.au  Fact Sheet for Healthcare Providers: IncredibleEmployment.be  This test is not yet approved or cleared by the [place] FDA and has been authorized for detection and/or diagnosis of SARS-CoV-2 by FDA under an Emergency Use Authorization (EUA). This EUA will remain in effect (meaning this test can be used) for the duration of the COVID-19 declaration under Section 564(b)(1) of the Act, 21 U.S.C. section 360bbb-3(b)(1), unless the authorization is terminated or revoked.  Performed at [facility], [place] [address] [place], [place] [address]   Urine Culture     Status: None   Collection Time: 08/30/21  3:25 PM   Specimen: Urine, Clean Catch  Result Value Ref Range Status   Specimen Description URINE, CLEAN CATCH  Final   Special Requests NONE  Final   Culture   Final    NO GROWTH Performed at [place], [place] [address] [place], [place] [address]    Report Status 09/01/2021 FINAL  Final  Culture, blood (routine x 2)     Status: None (Preliminary result)   Collection Time: 08/30/21  9:20 PM   Specimen: BLOOD  Result Value Ref Range Status   Specimen Description BLOOD LEFT ARM  Final   Special Requests   Final    BOTTLES DRAWN AEROBIC AND ANAEROBIC Blood Culture results may not be optimal due to an inadequate volume of blood received in culture bottles   Culture   Final    NO GROWTH 2 DAYS Performed at [facility], [place] [address] [place], [place] [address]    Report Status PENDING  Incomplete  Culture, blood (routine x 2)     Status: None (Preliminary result)   Collection Time: 08/30/21 10:03 PM   Specimen: BLOOD LEFT HAND  Result Value Ref Range Status   Specimen Description BLOOD LEFT HAND  Final   Special Requests   Final    BOTTLES DRAWN AEROBIC AND ANAEROBIC Blood Culture adequate volume   Culture   Final    NO GROWTH < 12 HOURS Performed at [facility], [place] [address] [place],  [address]    Report Status PENDING  Incomplete          Radiology Studies: CT Angio Chest PE W and/or Wo Contrast  Result Date: 08/30/2021 CLINICAL DATA:  Pulmonary embolism (PE) suspected, high prob. Left rib pain EXAM: CT ANGIOGRAPHY CHEST WITH CONTRAST TECHNIQUE: Multidetector CT imaging of the chest was performed using the standard protocol during bolus administration of intravenous contrast. Multiplanar CT image reconstructions and MIPs were obtained to evaluate the vascular anatomy. RADIATION DOSE REDUCTION: This exam was performed according to the departmental dose-optimization program which includes automated exposure control, adjustment of the mA and/or kV according to patient size and/or use of iterative reconstruction technique. CONTRAST:  160mL OMNIPAQUE IOHEXOL 350 MG/ML SOLN COMPARISON:  CT 09/08/2018, 12/03/2009 FINDINGS: Cardiovascular: Satisfactory opacification  of the pulmonary arteries to the segmental level. No evidence of pulmonary embolism. Pulmonary trunk measures 3.6 cm in diameter. Ascending thoracic aorta measures up to 3.9 cm in diameter, stable. Scattered atherosclerotic calcification of the aorta and coronary arteries. Normal heart size. No pericardial effusion. Mediastinum/Nodes: No enlarged mediastinal, hilar, or axillary lymph nodes. Thyroid gland and trachea demonstrate no significant findings. Mild circumferential wall thickening of the distal esophagus. Lungs/Pleura: Interlobular septal thickening within the lower lobes, more pronounced on the left with there is mild hazy ground-glass opacity. Subpleural reticulation within  the upper lobes. Minimal centrilobular and paraseptal emphysema. New 4 mm noncalcified pulmonary nodule in the right lower lobe (series 6, image 97). No pleural effusion or pneumothorax. Upper Abdomen: Reflux of contrast into the IVC and hepatic veins. No acute findings within the visualized upper abdomen. Unchanged 2.3 cm right adrenal gland nodule, stable since 2011 and benign. Musculoskeletal: New mild superior endplate compression fractures of T4 and T5. No traumatic listhesis. Acute mildly displaced fracture of the left third rib laterally with adjacent soft tissue stranding and ill-defined hematoma (series 5, image 47). Multiple additional healed nondisplaced bilateral rib fractures. Review of the MIP images confirms the above findings. IMPRESSION: 1. No evidence of acute pulmonary embolism. 2. Acute mildly displaced fracture of the left third rib laterally with adjacent soft tissue stranding and ill-defined hematoma. 3. New mild superior endplate compression fractures of T4 and T5. 4. Interlobular septal thickening within the lower lobes, more pronounced on the left with there is mild hazy ground-glass opacity. Findings may represent mild edema versus atypical/viral infection. 5. Mild circumferential wall thickening of the distal  esophagus, which may represent esophagitis. 6. Reflux of contrast into the IVC and hepatic veins, suggesting right heart dysfunction. 7. New 4 mm noncalcified pulmonary nodule in the right lower lobe. No follow-up needed if patient is low-risk. Non-contrast chest CT can be considered in 12 months if patient is high-risk. This recommendation follows the consensus statement: Guidelines for Management of Incidental Pulmonary Nodules Detected on CT Images: From the Fleischner Society 2017; Radiology 2017; 284:228-243. Aortic and coronary artery atherosclerosis (ICD10-I70.0). Electronically Signed   By: Davina Poke D.O.   On: 08/30/2021 15:19   CT Abdomen Pelvis W Contrast  Result Date: 08/30/2021 CLINICAL DATA:  Blunt abdominal trauma. Pain in the left rib cage and left upper abdomen. EXAM: CT ABDOMEN AND PELVIS WITH CONTRAST TECHNIQUE: Multidetector CT imaging of the abdomen and pelvis was performed using the standard protocol following bolus administration of intravenous contrast. RADIATION DOSE REDUCTION: This exam was performed according to the departmental dose-optimization program which includes automated exposure control, adjustment of the mA and/or kV according to patient size and/or use of iterative reconstruction technique. CONTRAST:  167mL OMNIPAQUE IOHEXOL 350 MG/ML SOLN, 54mL OMNIPAQUE IOHEXOL 300 MG/ML SOLN COMPARISON:  June 19, 2019 FINDINGS: Lower chest: Healed or healing left eleventh rib fracture. No acute findings. Hepatobiliary: No focal liver abnormality is seen. Status post cholecystectomy. No biliary dilatation. Pancreas: Unremarkable. No pancreatic ductal dilatation or surrounding inflammatory changes. Spleen: No splenic injury or perisplenic hematoma. Adrenals/Urinary Tract: Stable in size 2.3 cm right adrenal mass. Bilateral renal cysts, as well as some hypoattenuated masses in bilateral kidneys which are too small to be actually characterize by CT. Stomach/Bowel: Small hiatal  hernia. Stomach is within normal limits. Appendix appears normal. No evidence of bowel wall thickening, distention, or inflammatory changes. Scattered left colonic diverticulosis without evidence of diverticulitis. Vascular/Lymphatic: No significant vascular findings are present. No enlarged abdominal or pelvic lymph nodes. Reproductive: Surgically absent prostate gland. Other: Anterior abdominal wall repair. Musculoskeletal: No acute fracture is seen. IMPRESSION: 1. No evidence of acute traumatic injury to the abdomen or pelvis. 2. Healed or healing left eleventh rib fracture. 3. Bilateral renal cysts, as well as some hypoattenuated masses in bilateral kidneys which are too small to be actually characterize by CT. 4. Small hiatal hernia. 5. Stable in size 2.3 cm right adrenal mass, likely benign. Electronically Signed   By: Fidela Salisbury M.D.   On: 08/30/2021 16:49  ECHOCARDIOGRAM COMPLETE  Result Date: 08/31/2021    ECHOCARDIOGRAM REPORT   Patient Name:   Jamail Cullers. Date of Exam: 08/31/2021 Medical Rec #:  [id number]          Height:       69.0 in Accession #:    [id number]         Weight:       220.0 lb Date of Birth:  [birth year]          BSA:          2.151 m Patient Age:    80 years           BP:           104/75 mmHg Patient Gender: M                  HR:           80 bpm. Exam Location:  Inpatient Procedure: 2D Echo, Cardiac Doppler and Color Doppler Indications:    AFLUTTER  History:        Patient has prior history of Echocardiogram examinations, most                 recent 09/05/2020. Risk Factors:Hypertension and Diabetes. PULM.                 EMBOLI / HYPERCHOLESTEROLEMIA.  Sonographer:    Beryle Beams Referring Phys: [id number] Wheeler T TU IMPRESSIONS  1. Left ventricular ejection fraction, by estimation, is 60 to 65%. The left ventricle has normal function. The left ventricle has no regional wall motion abnormalities. There is mild left ventricular hypertrophy. Left ventricular diastolic  parameters are consistent with Grade I diastolic dysfunction (impaired relaxation).  2. Right ventricular systolic function is normal. The right ventricular size is normal. The estimated right ventricular systolic pressure is 59.9 mmHg.  3. Left atrial size was moderately dilated.  4. The mitral valve is abnormal. Trivial mitral valve regurgitation.  5. The aortic valve is tricuspid. Aortic valve regurgitation is not visualized.  6. The inferior vena cava is dilated in size with >50% respiratory variability, suggesting right atrial pressure of 8 mmHg.  7. Rhythm strip during this exam demostrated normal sinus rhythm. Comparison(s): No significant change from prior study. 09/05/2020: LVEF 60-65%, grade 1 DD, mild MR. FINDINGS  Left Ventricle: Left ventricular ejection fraction, by estimation, is 60 to 65%. The left ventricle has normal function. The left ventricle has no regional wall motion abnormalities. The left ventricular internal cavity size was normal in size. There is  mild left ventricular hypertrophy. Left ventricular diastolic parameters are consistent with Grade I diastolic dysfunction (impaired relaxation). Indeterminate filling pressures. Right Ventricle: The right ventricular size is normal. No increase in right ventricular wall thickness. Right ventricular systolic function is normal. The tricuspid regurgitant velocity is 2.43 m/s, and with an assumed right atrial pressure of 8 mmHg, the estimated right ventricular systolic pressure is 35.7 mmHg. Left Atrium: Left atrial size was moderately dilated. Right Atrium: Right atrial size was normal in size. Pericardium: There is no evidence of pericardial effusion. Mitral Valve: The mitral valve is abnormal. There is mild thickening of the mitral valve leaflet(s). Trivial mitral valve regurgitation. MV peak gradient, 68.9 mmHg. The mean mitral valve gradient is 41.0 mmHg. Tricuspid Valve: The tricuspid valve is grossly normal. Tricuspid valve regurgitation is  trivial. Aortic Valve: The aortic valve is tricuspid. Aortic valve regurgitation is not visualized. Aortic valve mean gradient measures 6.0 mmHg. Aortic  valve peak gradient measures 9.2 mmHg. Aortic valve area, by VTI measures 3.57 cm. Pulmonic Valve: The pulmonic valve was normal in structure. Pulmonic valve regurgitation is not visualized. Aorta: The aortic root and ascending aorta are structurally normal, with no evidence of dilitation. Venous: The inferior vena cava is dilated in size with greater than 50% respiratory variability, suggesting right atrial pressure of 8 mmHg. IAS/Shunts: No atrial level shunt detected by color flow Doppler. EKG: Rhythm strip during this exam demostrated normal sinus rhythm.  LEFT VENTRICLE PLAX 2D LVIDd:         4.40 cm     Diastology LVIDs:         2.80 cm     LV e' medial:    6.20 cm/s LV PW:         1.60 cm     LV E/e' medial:  11.7 LV IVS:        0.80 cm     LV e' lateral:   11.00 cm/s LVOT diam:     2.10 cm     LV E/e' lateral: 6.6 LV SV:         102 LV SV Index:   47 LVOT Area:     3.46 cm  LV Volumes (MOD) LV vol d, MOD A2C: 65.3 ml LV vol d, MOD A4C: 96.4 ml LV vol s, MOD A2C: 35.8 ml LV vol s, MOD A4C: 45.2 ml LV SV MOD A2C:     29.5 ml LV SV MOD A4C:     96.4 ml LV SV MOD BP:      41.7 ml RIGHT VENTRICLE             IVC RV S prime:     12.70 cm/s  IVC diam: 2.10 cm RVOT diam:      2.70 cm TAPSE (M-mode): 1.8 cm LEFT ATRIUM             Index        RIGHT ATRIUM           Index LA diam:        4.10 cm 1.91 cm/m   RA Area:     18.90 cm LA Vol (A2C):   59.8 ml 27.80 ml/m  RA Volume:   56.00 ml  26.03 ml/m LA Vol (A4C):   92.1 ml 42.81 ml/m LA Biplane Vol: 76.6 ml 35.60 ml/m  AORTIC VALVE                     PULMONIC VALVE AV Area (Vmax):    3.30 cm      PV Vmax:       0.63 m/s AV Area (Vmean):   3.19 cm      PV Peak grad:  1.6 mmHg AV Area (VTI):     3.57 cm AV Vmax:           152.00 cm/s AV Vmean:          114.000 cm/s AV VTI:            0.286 m AV Peak Grad:       9.2 mmHg AV Mean Grad:      6.0 mmHg LVOT Vmax:         145.00 cm/s LVOT Vmean:        105.000 cm/s LVOT VTI:          0.295 m LVOT/AV VTI ratio: 1.03  AORTA Ao Root diam: 3.10 cm Ao Asc diam:  3.00 cm MITRAL VALVE               TRICUSPID VALVE MV Area (PHT): 2.88 cm    TV Peak grad:   23.8 mmHg MV Area VTI:   1.14 cm    TV Mean grad:   16.0 mmHg MV Peak grad:  68.9 mmHg   TV Vmax:        2.44 m/s MV Mean grad:  41.0 mmHg   TV Vmean:       193.0 cm/s MV Vmax:       4.15 m/s    TV VTI:         0.66 msec MV Vmean:      287.0 cm/s  TR Peak grad:   23.6 mmHg MV Decel Time: 263 msec    TR Vmax:        243.00 cm/s MV E velocity: 72.80 cm/s MV A velocity: 75.00 cm/s  SHUNTS MV E/A ratio:  0.97        Systemic VTI:  0.30 m                            Systemic Diam: 2.10 cm                            Pulmonic Diam: 2.70 cm Lyman Bishop MD Electronically signed by Lyman Bishop MD Signature Date/Time: 08/31/2021/11:34:00 AM    Final         Scheduled Meds:  guaiFENesin  600 mg Oral BID   lidocaine  1 patch Transdermal Q24H   Continuous Infusions:  amiodarone 30 mg/hr (09/01/21 0236)   azithromycin Stopped (08/31/21 2227)   cefTRIAXone (ROCEPHIN)  IV Stopped (08/31/21 2318)   heparin 2,000 Units/hr (09/01/21 4158)   lactated ringers 75 mL/hr at 08/31/21 2057     LOS: 2 days        Hosie Poisson, MD [facility]   To contact the attending provider between 7A-7P or the covering provider during after hours 7P-7A, please log into the web site [email or web address] and access using universal [place] password for that web site. If you do not have the password, please call the hospital operator.  09/01/2021, 1:45 PM

## 2021-09-01 NOTE — Progress Notes (Signed)
ANTICOAGULATION CONSULT NOTE   Pharmacy Consult for Heparin Indication: atrial fibrillation  No Known Allergies  Patient Measurements: Height: 5\' 9"  (175.3 cm) Weight: 99.8 kg (220 lb) IBW/kg (Calculated) : 70.7 Heparin Dosing Weight: 91.8 kg  Vital Signs: Temp: 98.6 F (37 C) (01/16 2215) Temp Source: Oral (01/16 2215) BP: 120/76 (01/17 0500) Pulse Rate: 69 (01/17 0500)  Labs: Recent Labs    08/30/21 1223 08/30/21 1358 08/30/21 1427 08/31/21 0241 08/31/21 0553 08/31/21 0959 08/31/21 2100  HGB 11.3*  --  13.3  --  14.6  --   --   HCT 33.4*  --  39.0  --  45.3  --   --   PLT 127*  --   --   --  163  --   --   HEPARINUNFRC  --   --   --    < > <0.10* <0.10* 0.27*  CREATININE 1.35*  --   --   --  2.27*  --   --   TROPONINIHS 36* 59*  --   --   --   --   --    < > = values in this interval not displayed.     Estimated Creatinine Clearance: 37.8 mL/min (A) (by C-G formula based on SCr of 2.27 mg/dL (H)).   Medical History: Past Medical History:  Diagnosis Date   Arthritis    "fingers" (07/14/2018)   Depression    DVT (deep venous thrombosis) (HCC) LLE   Hepatitis C     finished harvoni tx ~ 2017   Hypercholesterolemia    Hypertension    Prostate cancer ([place]) 80yrs ago   Pulmonary embolism and infarction ([place]) 07/13/2018   Sleep apnea    not currently using cpap, mask causing vertigo   Type II diabetes mellitus ([place])     Medications:  (Not in a hospital admission)  Scheduled:   guaiFENesin  600 mg Oral BID   lidocaine  1 patch Transdermal Q24H   Infusions:   amiodarone 30 mg/hr (09/01/21 0236)   azithromycin Stopped (08/31/21 2227)   cefTRIAXone (ROCEPHIN)  IV Stopped (08/31/21 2318)   heparin 1,900 Units/hr (09/01/21 0047)   lactated ringers 75 mL/hr at 08/31/21 2057   PRN: acetaminophen, HYDROcodone-acetaminophen, ipratropium, levalbuterol, traMADol  Assessment: 7 yom with a history of diabetes, PE/DVT, hypertension, prior opiate abuse .  Patient is presenting with SOB. Heparin per pharmacy consult placed for atrial fibrillation.  Heparin level 0.27 (on 1900 units/hr) No signs/symptoms of bleed  Goal of Therapy:  Heparin level 0.3-0.7 units/ml Monitor platelets by anticoagulation protocol: Yes   Plan:  Increase heparin drip to 2000 units/hr Heparin level @1400  Daily heparin level and CBC Monitor for signs/symptoms of bleed  Thank you for allowing pharmacy to be a part of this patients care.  Donnald Garre, PharmD Clinical Pharmacist  Please check AMION for all [place] numbers After 10:00 PM, call [place] [phone]

## 2021-09-01 NOTE — TOC Benefit Eligibility Note (Signed)
Patient Advocate Encounter ° °Insurance verification completed.   ° °The patient is currently admitted and upon discharge could be taking Eliquis 5 mg. ° °The current 30 day co-pay is, $47.00.  ° °The patient is insured through [employer] Medicare Part D  ° ° ° °Darick Fetters, CPhT °Pharmacy Patient Advocate Specialist °[facility] Patient Advocate Team °Direct Number: [phone]  Fax: [phone] ° ° ° ° ° °  °

## 2021-09-01 NOTE — Progress Notes (Signed)
ANTICOAGULATION CONSULT NOTE   Pharmacy Consult for Heparin Indication: atrial fibrillation  No Known Allergies  Patient Measurements: Height: 5\' 9"  (175.3 cm) Weight: 99.8 kg (220 lb) IBW/kg (Calculated) : 70.7 Heparin Dosing Weight: 91.8 kg  Vital Signs: Temp: 99 F (37.2 C) (01/17 1329) Temp Source: Oral (01/17 1329) BP: 143/92 (01/17 1329) Pulse Rate: 69 (01/17 1329)  Labs: Recent Labs    08/30/21 1223 08/30/21 1358 08/30/21 1427 08/31/21 0241 08/31/21 0553 08/31/21 0959 08/31/21 2100 09/01/21 0943  HGB 11.3*  --  13.3  --  14.6  --   --   --   HCT 33.4*  --  39.0  --  45.3  --   --   --   PLT 127*  --   --   --  163  --   --   --   HEPARINUNFRC  --   --   --    < > <0.10* <0.10* 0.27* 0.39  CREATININE 1.35*  --   --   --  2.27*  --   --  1.61*  TROPONINIHS 36* 59*  --   --   --   --   --   --    < > = values in this interval not displayed.     Estimated Creatinine Clearance: 53.2 mL/min (A) (by C-G formula based on SCr of 1.61 mg/dL (H)).   Medical History: Past Medical History:  Diagnosis Date   Arthritis    "fingers" (07/14/2018)   Depression    DVT (deep venous thrombosis) (HCC) LLE   Hepatitis C     finished [facility] tx ~ 2017   Hypercholesterolemia    Hypertension    Prostate cancer ([place]) 40yrs ago   Pulmonary embolism and infarction ([place]) 07/13/2018   Sleep apnea    not currently using cpap, mask causing vertigo   Type II diabetes mellitus (HCC)     Medications:  Medications Prior to Admission  Medication Sig Dispense Refill Last Dose   acetaminophen (TYLENOL) 325 MG tablet Take 2 tablets (650 mg total) by mouth every 8 (eight) hours as needed for mild pain, fever or headache. (Patient not taking: Reported on 08/31/2021) 30 tablet 0 Not Taking   albuterol (PROVENTIL) (2.5 MG/3ML) 0.083% nebulizer solution Take 3 mLs (2.5 mg total) by nebulization every 2 (two) hours as needed for wheezing. (Patient not taking: Reported on 08/31/2021) 75 mL 12  Not Taking   amLODipine (NORVASC) 5 MG tablet Take 1 tablet (5 mg total) by mouth daily. (Patient not taking: Reported on 08/31/2021) 30 tablet 0 Not Taking   aspirin EC 81 MG tablet Take 1 tablet (81 mg total) by mouth daily. Swallow whole. (Patient not taking: Reported on 08/31/2021) 30 tablet 0 Not Taking   clotrimazole (LOTRIMIN) 1 % cream Apply topically 2 (two) times daily. (Patient not taking: Reported on 08/31/2021) 30 g 0 Not Taking   colchicine 0.6 MG tablet Take 1 tablet (0.6 mg total) by mouth daily. (Patient not taking: Reported on 08/31/2021) 30 tablet 0 Not Taking   dextromethorphan-guaiFENesin (MUCINEX DM) 30-600 MG 12hr tablet Take 1 tablet by mouth 2 (two) times daily as needed for cough. (Patient not taking: Reported on 08/31/2021) 20 tablet 0 Not Taking   docusate sodium (COLACE) 100 MG capsule Take 1 capsule (100 mg total) by mouth 2 (two) times daily. (Patient not taking: Reported on 08/31/2021) 10 capsule 0 Not Taking   folic acid (FOLVITE) 1 MG tablet Take 1 tablet (1  mg total) by mouth daily. (Patient not taking: Reported on 08/31/2021) 30 tablet 0 Not Taking   metoprolol tartrate (LOPRESSOR) 25 MG tablet Take 1 tablet (25 mg total) by mouth 2 (two) times daily. (Patient not taking: Reported on 08/31/2021) 60 tablet 0 Not Taking   Multiple Vitamin (MULTIVITAMIN WITH MINERALS) TABS tablet Take 1 tablet by mouth daily. (Patient not taking: Reported on 08/31/2021) 30 tablet 0 Not Taking   pantoprazole (PROTONIX) 40 MG tablet Take 1 tablet (40 mg total) by mouth at bedtime. (Patient not taking: Reported on 08/31/2021) 30 tablet 0 Not Taking   polyethylene glycol (MIRALAX / GLYCOLAX) 17 g packet Take 17 g by mouth daily. (Patient not taking: Reported on 08/31/2021) 14 each 0 Not Taking   sertraline (ZOLOFT) 25 MG tablet Take 1 tablet (25 mg total) by mouth daily. (Patient not taking: Reported on 08/31/2021) 30 tablet 0 Not Taking   sucralfate (CARAFATE) 1 GM/10ML suspension Take 10 mLs (1 g  total) by mouth 4 (four) times daily -  with meals and at bedtime. (Patient not taking: Reported on 08/31/2021) 1200 mL 0 Not Taking   thiamine 100 MG tablet Take 1 tablet (100 mg total) by mouth daily. (Patient not taking: Reported on 08/31/2021) 30 tablet 0 Not Taking    Scheduled:   guaiFENesin  600 mg Oral BID   lidocaine  1 patch Transdermal Q24H   Infusions:   amiodarone 30 mg/hr (09/01/21 0236)   azithromycin Stopped (08/31/21 2227)   cefTRIAXone (ROCEPHIN)  IV Stopped (08/31/21 2318)   heparin 2,000 Units/hr (09/01/21 1517)   lactated ringers 75 mL/hr at 08/31/21 2057   PRN: acetaminophen, HYDROcodone-acetaminophen, ipratropium, levalbuterol, traMADol  Assessment: Todd [name] with a history of diabetes, PE/DVT, hypertension, prior opiate abuse . Patient is presenting with SOB. Heparin per pharmacy consult placed for atrial fibrillation.  Heparin level 0.39 (on 2000 units/hr) No signs/symptoms of bleeding.   Goal of Therapy:  Heparin level 0.3-0.7 units/ml Monitor platelets by anticoagulation protocol: Yes   Plan:  Continue heparin drip at 2000 units/hr Daily heparin level and CBC Monitor for signs/symptoms of bleed  Thank you for allowing pharmacy to be a part of this patients care.  Erin Hearing PharmD., BCPS Clinical Pharmacist 09/01/2021 2:30 PM

## 2021-09-02 ENCOUNTER — Other Ambulatory Visit (HOSPITAL_COMMUNITY): Payer: Self-pay

## 2021-09-02 LAB — BASIC METABOLIC PANEL
Anion gap: 9 (ref 5–15)
BUN: 17 mg/dL (ref 8–23)
CO2: 20 mmol/L — ABNORMAL LOW (ref 22–32)
Calcium: 8.2 mg/dL — ABNORMAL LOW (ref 8.9–10.3)
Chloride: 109 mmol/L (ref 98–111)
Creatinine, Ser: 1.38 mg/dL — ABNORMAL HIGH (ref 0.61–1.24)
GFR, Estimated: 57 mL/min — ABNORMAL LOW (ref >60)
Glucose, Bld: 99 mg/dL (ref 70–99)
Potassium: 3.9 mmol/L (ref 3.5–5.1)
Sodium: 138 mmol/L (ref 135–145)

## 2021-09-02 LAB — CBC
HCT: 36.2 % — ABNORMAL LOW (ref 39.0–52.0)
Hemoglobin: 12.3 g/dL — ABNORMAL LOW (ref 13.0–17.0)
MCH: 32 pg (ref 26.0–34.0)
MCHC: 34 g/dL (ref 30.0–36.0)
MCV: 94.3 fL (ref 80.0–100.0)
Platelets: 190 10*3/uL (ref 150–400)
RBC: 3.84 MIL/uL — ABNORMAL LOW (ref 4.22–5.81)
RDW: 13.2 % (ref 11.5–15.5)
WBC: 7.7 10*3/uL (ref 4.0–10.5)
nRBC: 0 % (ref 0.0–0.2)

## 2021-09-02 LAB — GLUCOSE, CAPILLARY: Glucose-Capillary: 90 mg/dL (ref 70–99)

## 2021-09-02 LAB — HEMOGLOBIN A1C
Hgb A1c MFr Bld: 5.8 % — ABNORMAL HIGH (ref 4.8–5.6)
Mean Plasma Glucose: 119.76 mg/dL

## 2021-09-02 MED ORDER — APIXABAN 5 MG PO TABS
5.0000 mg | ORAL_TABLET | Freq: Two times a day (BID) | ORAL | 0 refills | Status: DC
  Filled 2021-09-02: qty 60, 30d supply, fill #0

## 2021-09-02 MED ORDER — ACETAMINOPHEN 500 MG PO TABS
1000.0000 mg | ORAL_TABLET | Freq: Four times a day (QID) | ORAL | 0 refills | Status: DC | PRN
  Filled 2021-09-02: qty 30, 4d supply, fill #0

## 2021-09-02 MED ORDER — CYCLOBENZAPRINE HCL 10 MG PO TABS: 5.0000 mg | ORAL_TABLET | Freq: Three times a day (TID) | ORAL | Status: DC | PRN

## 2021-09-02 MED ORDER — CYCLOBENZAPRINE HCL 5 MG PO TABS
5.0000 mg | ORAL_TABLET | Freq: Three times a day (TID) | ORAL | 0 refills | Status: DC | PRN
  Filled 2021-09-02: qty 30, 10d supply, fill #0

## 2021-09-02 MED ORDER — AMLODIPINE BESYLATE 5 MG PO TABS
5.0000 mg | ORAL_TABLET | Freq: Every day | ORAL | 0 refills | Status: DC
  Filled 2021-09-02: qty 30, 30d supply, fill #0

## 2021-09-02 MED ORDER — PANTOPRAZOLE SODIUM 40 MG PO TBEC
40.0000 mg | DELAYED_RELEASE_TABLET | Freq: Every day | ORAL | 0 refills | Status: DC
  Filled 2021-09-02: qty 30, 30d supply, fill #0

## 2021-09-02 MED ORDER — CEPHALEXIN 500 MG PO CAPS
500.0000 mg | ORAL_CAPSULE | Freq: Three times a day (TID) | ORAL | 0 refills | Status: AC
  Filled 2021-09-02: qty 9, 3d supply, fill #0

## 2021-09-02 MED ORDER — METOPROLOL TARTRATE 25 MG PO TABS
25.0000 mg | ORAL_TABLET | Freq: Two times a day (BID) | ORAL | 0 refills | Status: DC
  Filled 2021-09-02: qty 60, 30d supply, fill #0

## 2021-09-02 MED ORDER — CEPHALEXIN 500 MG PO CAPS
500.0000 mg | ORAL_CAPSULE | Freq: Three times a day (TID) | ORAL | Status: DC
  Administered 2021-09-03 – 2021-09-04 (×4): 500 mg via ORAL
  Filled 2021-09-02 (×4): qty 1

## 2021-09-02 MED ORDER — ACETAMINOPHEN 500 MG PO TABS
1000.0000 mg | ORAL_TABLET | Freq: Four times a day (QID) | ORAL | Status: DC
  Administered 2021-09-02 – 2021-09-03 (×4): 1000 mg via ORAL
  Filled 2021-09-02 (×6): qty 2

## 2021-09-02 MED ORDER — SODIUM CHLORIDE 0.9 % IV SOLN
2.0000 g | Freq: Once | INTRAVENOUS | Status: AC
  Administered 2021-09-02: 2 g via INTRAVENOUS
  Filled 2021-09-02: qty 20

## 2021-09-02 NOTE — Care Management (Addendum)
1247 09-02-21 Case Manager received a consult for medication assistance. Patient has [employer] Medicare- Case Manager will not be able to assist with medications/co pays. Patient has a source of income. Eliquis co pay will be $47.00 after the 30 day free coupon. All Rx's need to be sent to the [facility] for medication delivery. No further needs identified by Case Manager at this time.    1500 09-02-21 Case Manager received notification from the provider that patient feels that he is not ready to be discharged from the hospital. Case Manager spoke with the patient and he states he does not want to return to the [facility] on [address]. Patient reports ETOH and cocaine abuse and that he wants inpatient detox treatment at [facility]. Patient provided Case Manager a number for [phone]. Case Manager called [facility] and spoke with a representative Raquel Sarna, the location was for [facility], [place]. Case Manager provided the information to the CSW to see if she can assist with getting the patient to this location vs [facility] location. CSW stated that the patient will need to call and provide his information. Patient reported that he was having difficulty breathing. Incentive Spirometer provided to the patient. MD will hold the patient overnight to see if the patient can enter [facility]. No further needs from Case Manager at this time.

## 2021-09-02 NOTE — Discharge Instructions (Signed)

## 2021-09-02 NOTE — Progress Notes (Addendum)
Update- CSW received a call from Case manager who informed CSW patient requesting information for [facility] in [place]. CSW provided patient with address, telephone number and who to contact. CSW informed patient that CSW spoke with Raquel Sarna with [facility] who informed CSW that he will need to call to do initial assessment. If they decide to accept patient they can provide transportation to pick up patient from address given by patient.CSW left information with patient to call. Patient thanked CSW.  CSW received consult for substance use and homeless resources. CSW spoke with patient at bedside. Patient reports he comes from motel. Patient reports he does have an income. Patients plan is to return to motel. Patient does not want to go to a shelter. Patient accepted resources from [place] for area shelters and [place] living options for seniors. Patient confirmed he will need [facility] transportation when medically ready for dc.All questions answered. No further questions reported at this time.

## 2021-09-02 NOTE — Progress Notes (Signed)
PROGRESS NOTE    Todd [name].  QVZ:[id number] DOB: 12/31/1955 DOA: 08/30/2021 PCP: [name], MD    Brief Narrative:  66 year old gentleman with history of hypertension, history of PE and DVT, type 2 diabetes that is diet controlled, polysubstance abuse including alcohol and cocaine and homelessness presented to the hospital with increasing shortness of breath.  On arrival, CTA of the chest was negative for pulmonary embolism, it showed multiple new and old stable rib fractures, new superior endplate compression fracture T4 and T5 as well as possible left lower lobe atelectasis/bronchitis.  UDS positive for cocaine.  EKG consistent with A. fib with RVR.   Assessment & Plan:   A flutter/A. fib with RVR, intermittent hypotension: Initially treated with IV amiodarone and IV heparin.  Converted to sinus rhythm. Previously on metoprolol 25 mg twice daily, that was resumed.  Patient was not taking at home.  TSH within normal limits. Echocardiogram with normal ejection fraction, grade 1 diastolic dysfunction. As per cardiology recommendation, continue rate control with metoprolol, started on Eliquis.  Counseled and education provided on use of anticoagulation.  Chest pain secondary to multiple old rib fractures, acute left third rib fracture with hematoma: Patient had multiple episodes of fall related to intoxication and substance abuse.  His fractures are stable.  There is no evidence of lung contusion or pulmonary complications. Symptomatic treatment with -Lidocaine patch -Tylenol 1 g every 6 hours as needed -Flexeril 5 mg 3 times a day as needed. Not prescribing opiates in a patient with known drug abuse.  Sepsis present on admission due to: Lactic acidosis/suspected UTI present on admission/suspected pneumonia present on admission: Lactic acid normalized with IV fluid treatment.  Sepsis physiology resolved. Blood cultures negative.  Urine culture with no growth.  Pulmonary  symptoms mostly improved. Rocephin day 4 today.  Discontinue azithromycin.  Keflex 500 mg 3 times a day for next 3 days.  Essential hypertension: Blood pressure is stable now.  Will prescribe amlodipine along with metoprolol.  GERD/reflux disease: On PPI that will be continued.  Will prescribe.  Polysubstance abuse: Counseled to quit.  Interested on inpatient drug and alcohol rehab.  Social worker helping.   DVT prophylaxis:  apixaban (ELIQUIS) tablet 5 mg   Code Status: Full code Family Communication: None Disposition Plan: Status is: Inpatient  Remains inpatient appropriate because: Wanting to go to inpatient rehab.    Consultants:  Cardiology  Procedures:  None  Antimicrobials:  Rocephin and azithromycin 1/15--- continued   Subjective: Patient seen and examined.  Early morning rounds, patient has not mobilized.  He tells me that he is having a lot of pain and difficulty eating.  Provided him with lidocaine patch and high-dose Tylenol, went to examine patient.  He was sleeping comfortably. When he was resting comfortably, I discussed with him about discharge planning, prescribing blood thinner, blood pressure medications and muscle relaxants with lidocaine patch and he is to be discharged, patient started talking very rude and also told me that he cannot go home, he has no place to go and wants to go to inpatient drug and alcohol rehab.  I discussed case with case management team, will do our best, try to control his pain and refer him to inpatient drug and alcohol rehab so that he can get more help. Will monitor him tonight in the hospital given his rib fracture related pain and also no place to go today.  If unable to go to inpatient drug rehab, he may have to be discharged  to shelters.  Objective: Vitals:   09/02/21 0510 09/02/21 0915 09/02/21 0918 09/02/21 1214  BP: (!) 142/85 (!) 153/91  132/83  Pulse: 71 71 71 69  Resp:  20 (!) 24 (!) 21  Temp: 99.5 F (37.5 C)    98.9 F (37.2 C)  TempSrc: Oral   Oral  SpO2: 95% 94% 96%   Weight:      Height:        Intake/Output Summary (Last 24 hours) at 09/02/2021 1510 Last data filed at 09/02/2021 1400 Gross per 24 hour  Intake 4068.75 ml  Output 1925 ml  Net 2143.75 ml   Filed Weights   08/30/21 1210  Weight: 99.8 kg    Examination:  General: Calm and comfortable while laying in the bed, anxious and irritated on discharge discussions. Cardiovascular: S1-S2 normal. Respiratory: Bilateral equal air entry.  On room air.  He does have some palpable tenderness on the left lower rib cage. Gastrointestinal: Soft.  Nontender.  Bowel sound present. Ext: No swelling or deformity.     Data Reviewed: I have personally reviewed following labs and imaging studies  CBC: Recent Labs  Lab 08/30/21 1223 08/30/21 1427 08/31/21 0553 09/02/21 0322  WBC 9.4  --  9.4 7.7  NEUTROABS 7.5  --   --   --   HGB 11.3* 13.3 14.6 12.3*  HCT 33.4* 39.0 45.3 36.2*  MCV 96.0  --  95.8 94.3  PLT 127*  --  163 220   Basic Metabolic Panel: Recent Labs  Lab 08/30/21 1223 08/30/21 1427 08/31/21 0553 09/01/21 0943 09/02/21 0322  NA 127* 129* 129* 140 138  K 4.8 4.7 4.7 3.8 3.9  CL 100  --  103 104 109  CO2 13*  --  19* 20* 20*  GLUCOSE 98  --  94 117* 99  BUN 26*  --  40* 22 17  CREATININE 1.35*  --  2.27* 1.61* 1.38*  CALCIUM 7.3*  --  8.0* 7.7* 8.2*   GFR: Estimated Creatinine Clearance: 62.1 mL/min (A) (by C-G formula based on SCr of 1.38 mg/dL (H)). Liver Function Tests: Recent Labs  Lab 08/30/21 1223  AST 13*  ALT 9  ALKPHOS 30*  BILITOT 0.8  PROT 4.0*  ALBUMIN 1.6*   Recent Labs  Lab 08/30/21 1223  LIPASE 32   No results for input(s): AMMONIA in the last 168 hours. Coagulation Profile: No results for input(s): INR, PROTIME in the last 168 hours. Cardiac Enzymes: No results for input(s): CKTOTAL, CKMB, CKMBINDEX, TROPONINI in the last 168 hours. BNP (last 3 results) No results for  input(s): PROBNP in the last 8760 hours. HbA1C: Recent Labs    09/02/21 0322  HGBA1C 5.8*   CBG: Recent Labs  Lab 08/31/21 1956 08/31/21 2200 09/01/21 0226 09/02/21 0732  GLUCAP 141* 235* 100* 90   Lipid Profile: No results for input(s): CHOL, HDL, LDLCALC, TRIG, CHOLHDL, LDLDIRECT in the last 72 hours. Thyroid Function Tests: Recent Labs    08/31/21 0553  TSH 1.452   Anemia Panel: No results for input(s): VITAMINB12, FOLATE, FERRITIN, TIBC, IRON, RETICCTPCT in the last 72 hours. Sepsis Labs: Recent Labs  Lab 08/30/21 1223 08/30/21 1529 08/31/21 0241  LATICACIDVEN >9.0* 1.3 1.6    Recent Results (from the past 240 hour(s))  Resp Panel by RT-PCR (Flu A&B, Covid) Nasopharyngeal Swab     Status: None   Collection Time: 08/30/21  2:23 PM   Specimen: Nasopharyngeal Swab; Nasopharyngeal(NP) swabs in vial transport medium  Result  Value Ref Range Status   SARS Coronavirus 2 by RT PCR NEGATIVE NEGATIVE Final    Comment: (NOTE) SARS-CoV-2 target nucleic acids are NOT DETECTED.  The SARS-CoV-2 RNA is generally detectable in upper respiratory specimens during the acute phase of infection. The lowest concentration of SARS-CoV-2 viral copies this assay can detect is 138 copies/mL. A negative result does not preclude SARS-Cov-2 infection and should not be used as the sole basis for treatment or other patient management decisions. A negative result may occur with  improper specimen collection/handling, submission of specimen other than nasopharyngeal swab, presence of viral mutation(s) within the areas targeted by this assay, and inadequate number of viral copies(<138 copies/mL). A negative result must be combined with clinical observations, patient history, and epidemiological information. The expected result is Negative.  Fact Sheet for Patients:  EntrepreneurPulse.com.au  Fact Sheet for Healthcare Providers:   IncredibleEmployment.be  This test is no t yet approved or cleared by the [place] FDA and  has been authorized for detection and/or diagnosis of SARS-CoV-2 by FDA under an Emergency Use Authorization (EUA). This EUA will remain  in effect (meaning this test can be used) for the duration of the COVID-19 declaration under Section 564(b)(1) of the Act, 21 U.S.C.section 360bbb-3(b)(1), unless the authorization is terminated  or revoked sooner.       Influenza A by PCR NEGATIVE NEGATIVE Final   Influenza B by PCR NEGATIVE NEGATIVE Final    Comment: (NOTE) The Xpert Xpress SARS-CoV-2/FLU/RSV plus assay is intended as an aid in the diagnosis of influenza from Nasopharyngeal swab specimens and should not be used as a sole basis for treatment. Nasal washings and aspirates are unacceptable for Xpert Xpress SARS-CoV-2/FLU/RSV testing.  Fact Sheet for Patients: EntrepreneurPulse.com.au  Fact Sheet for Healthcare Providers: IncredibleEmployment.be  This test is not yet approved or cleared by the [place] FDA and has been authorized for detection and/or diagnosis of SARS-CoV-2 by FDA under an Emergency Use Authorization (EUA). This EUA will remain in effect (meaning this test can be used) for the duration of the COVID-19 declaration under Section 564(b)(1) of the Act, 21 U.S.C. section 360bbb-3(b)(1), unless the authorization is terminated or revoked.  Performed at [facility], [place] [address], [place], [place] [address]   Urine Culture     Status: None   Collection Time: 08/30/21  3:25 PM   Specimen: Urine, Clean Catch  Result Value Ref Range Status   Specimen Description URINE, CLEAN CATCH  Final   Special Requests NONE  Final   Culture   Final    NO GROWTH Performed at [facility], [place] [address], [place], [place] [address]    Report Status 09/01/2021 FINAL  Final  Culture, blood (routine x 2)      Status: None (Preliminary result)   Collection Time: 08/30/21  9:20 PM   Specimen: BLOOD  Result Value Ref Range Status   Specimen Description BLOOD LEFT ARM  Final   Special Requests   Final    BOTTLES DRAWN AEROBIC AND ANAEROBIC Blood Culture results may not be optimal due to an inadequate volume of blood received in culture bottles   Culture   Final    NO GROWTH 3 DAYS Performed at [facility], [place] [address], [place], [place] [address]    Report Status PENDING  Incomplete  Culture, blood (routine x 2)     Status: None (Preliminary result)   Collection Time: 08/30/21 10:03 PM   Specimen:  BLOOD LEFT HAND  Result Value Ref Range Status   Specimen Description BLOOD LEFT HAND  Final   Special Requests   Final    BOTTLES DRAWN AEROBIC AND ANAEROBIC Blood Culture adequate volume   Culture   Final    NO GROWTH 2 DAYS Performed at [facility], [address], [place], [place] [address]    Report Status PENDING  Incomplete         Radiology Studies: No results found.      Scheduled Meds:  acetaminophen  1,000 mg Oral Q6H   apixaban  5 mg Oral BID   [START ON 09/03/2021] cephALEXin  500 mg Oral Q8H   guaiFENesin  600 mg Oral BID   lidocaine  1 patch Transdermal Q24H   metoprolol tartrate  25 mg Oral BID   Continuous Infusions:  cefTRIAXone (ROCEPHIN)  IV       LOS: 3 days    Time spent: 35 minutes    Barb Merino, MD [facility] Pager [phone]

## 2021-09-03 ENCOUNTER — Other Ambulatory Visit (HOSPITAL_COMMUNITY): Payer: Self-pay

## 2021-09-03 ENCOUNTER — Inpatient Hospital Stay (HOSPITAL_COMMUNITY): Payer: Medicare HMO

## 2021-09-03 LAB — CBC
HCT: 37.8 % — ABNORMAL LOW (ref 39.0–52.0)
Hemoglobin: 12.8 g/dL — ABNORMAL LOW (ref 13.0–17.0)
MCH: 32 pg (ref 26.0–34.0)
MCHC: 33.9 g/dL (ref 30.0–36.0)
MCV: 94.5 fL (ref 80.0–100.0)
Platelets: 189 10*3/uL (ref 150–400)
RBC: 4 MIL/uL — ABNORMAL LOW (ref 4.22–5.81)
RDW: 13.1 % (ref 11.5–15.5)
WBC: 7.3 10*3/uL (ref 4.0–10.5)
nRBC: 0 % (ref 0.0–0.2)

## 2021-09-03 MED ORDER — TRAMADOL HCL 50 MG PO TABS
50.0000 mg | ORAL_TABLET | Freq: Once | ORAL | Status: AC
  Administered 2021-09-03: 50 mg via ORAL

## 2021-09-03 NOTE — Progress Notes (Signed)
CSW spoke with patient regarding dc plan. Patient reports he plans on returning back to his motel. Patient request to dc back to motel before 11am. Patient informed CSW he will need [facility] transportation at dc. CSW informed MD and CM.

## 2021-09-03 NOTE — Care Management Important Message (Signed)
Important Message  Patient Details  Name: Todd [name]. MRN: [id number] Date of Birth: 01-08-1956   Medicare Important Message Given:  Yes     Shelda Altes 09/03/2021, 7:29 AM

## 2021-09-03 NOTE — Progress Notes (Signed)
PROGRESS NOTE    Todd [name].  SWH:[id number] DOB: 25-Sep-1955 DOA: 08/30/2021 PCP: [name], MD   Chief Complaint  Patient presents with   Shortness of Breath  Brief Narrative/Hospital Course: Todd [name]., 66 y.o. male with PMH of history of PE and DVT, type 2 diabetes that is diet controlled, polysubstance abuse including alcohol and cocaine and homelessness presented to the hospital with increasing shortness of breath.  On arrival, CTA of the chest was negative for pulmonary embolism, it showed multiple new and old stable rib fractures, new superior endplate compression fracture T4 and T5 as well as possible left lower lobe atelectasis/bronchitis.  UDS positive for cocaine.  EKG consistent with A. fib with RVR. Patient was admitted treated for a flutter and A. fib with RVR with intermittent hypotension seen by cardiology.  He will continue with metoprolol anticoagulation with Eliquis. Presents complaining of chest pain due to multiple old fractures continue pain control with lidocaine patch Tylenol Flexeril.  Minimizing opiates due to his history.   Subjective: Patient complains of shoulder pain and neck pain and feels unsafe for discharge.  Assessment & Plan:  AFlutter/ A FIB with RVR Intermittent hypotension: Treated with IV amiodarone, converted to NSR,was admittable previously resumed.TSH is stable TTE W/ preserved EF.  Continue metoprolol, Eliquis.  Patient has been educated on Eliquis use.  Pain secondary to multiple old rib fractures acute left third rib fracture with hematoma:  Continue pain management lidocaine patch Tylenol Flexeril, avoid opiates due to known drug abuse history.  Patient had multiple episodes of fall related to intoxication and substance abuse likely contributed to the fractures.  Sepsis POA with lactic acidosis suspected UTI pneumonia: At this time vitals are stable urine culture no growth sepsis physiology resolved.  S/P Rocephin and  azithromycin.  Keflex to be continued orally  Essential hypertension blood pressure stable continue amlodipine metoprolol GERD continue PPI Polysubstance abuse counseled to quit he is interested in detox, Homelessness: At this time patient wants feels unsafe for discharge today reports he is making few calls.  Toc involved provided list of shelter.  He is interested to go to inpatient detox-reports he is waiting to hear from them and has made a call this morning\  Bilateral knee pain history of right knee muscle injury.  X-ray done arthritis chronic changes nontender and no effusion exam  DVT prophylaxis: Eliquis Code Status:   Code Status: Full Code Family Communication: plan of care discussed with patient at bedside. Status is: Inpatient Disposition: Wanting to go to inpatient rehab if not able to find he may have TO [place].TOC ON BOARD.  Total time spent in the care of this patient 35 MINUTES Objective: Vitals last 24 hrs: Vitals:   09/02/21 1652 09/02/21 2216 09/03/21 0500 09/03/21 1008  BP:  (!) 147/86 (!) 149/74 (!) 144/79  Pulse:  68 63 63  Resp: 20 (!) 21 (!) 21 18  Temp:  98.8 F (37.1 C) 99.3 F (37.4 C) 98.6 F (37 C)  TempSrc:  Oral Oral Oral  SpO2:  95% 98% 98%  Weight:   86.3 kg   Height:       Weight change:   Intake/Output Summary (Last 24 hours) at 09/03/2021 1557 Last data filed at 09/02/2021 2215 Gross per 24 hour  Intake 360 ml  Output 950 ml  Net -590 ml   Net IO Since Admission: 3,936.42 mL [09/03/21 1557]   Physical Examination: General exam: AA0x3, weak,older than stated age. HEENT:Oral mucosa  moist, Ear/Nose WNL grossly,dentition normal. Respiratory system: B/l clear BS, no use of accessory muscle, non tender. Cardiovascular system: S1 & S2 +,No JVD. Gastrointestinal system: Abdomen soft, NT,ND, BS+. Nervous System:Alert, awake, moving extremities. Extremities: edema none, distal peripheral pulses palpable.  Skin: No rashes, no  icterus. MSK: Normal muscle bulk, tone, power.  Medications reviewed:  Scheduled Meds:  acetaminophen  1,000 mg Oral Q6H   apixaban  5 mg Oral BID   cephALEXin  500 mg Oral Q8H   guaiFENesin  600 mg Oral BID   lidocaine  1 patch Transdermal Q24H   metoprolol tartrate  25 mg Oral BID   Continuous Infusions: Diet Order             Diet - low sodium heart healthy           DIET SOFT Room service appropriate? Yes; Fluid consistency: Thin  Diet effective now                          Weight change:   Wt Readings from Last 3 Encounters:  09/03/21 86.3 kg  10/07/20 81.3 kg  09/24/20 78.6 kg     Consultants:see note  Procedures:see note Antimicrobials: Anti-infectives (From admission, onward)    Start     Dose/Rate Route Frequency Ordered Stop   09/03/21 0600  cephALEXin (KEFLEX) capsule 500 mg        500 mg Oral Every 8 hours 09/02/21 1451 09/06/21 0559   09/03/21 0000  cephALEXin (KEFLEX) 500 MG capsule        500 mg Oral Every 8 hours 09/02/21 1509 09/06/21 2359   09/02/21 1500  cefTRIAXone (ROCEPHIN) 2 g in sodium chloride 0.9 % 100 mL IVPB        2 g 200 mL/hr over 30 Minutes Intravenous  Once 09/02/21 1447 09/02/21 1652   08/30/21 2130  cefTRIAXone (ROCEPHIN) 2 g in sodium chloride 0.9 % 100 mL IVPB  Status:  Discontinued        2 g 200 mL/hr over 30 Minutes Intravenous Every 24 hours 08/30/21 2020 09/02/21 1447   08/30/21 2030  azithromycin (ZITHROMAX) 500 mg in sodium chloride 0.9 % 250 mL IVPB  Status:  Discontinued        500 mg 250 mL/hr over 60 Minutes Intravenous Every 24 hours 08/30/21 2020 09/02/21 1027   08/30/21 1545  piperacillin-tazobactam (ZOSYN) IVPB 3.375 g        3.375 g 100 mL/hr over 30 Minutes Intravenous  Once 08/30/21 1542 08/30/21 1700   08/30/21 1530  cefTRIAXone (ROCEPHIN) 1 g in sodium chloride 0.9 % 100 mL IVPB  Status:  Discontinued        1 g 200 mL/hr over 30 Minutes Intravenous  Once 08/30/21 1525 08/30/21 1542       Culture/Microbiology    Component Value Date/Time   SDES BLOOD LEFT HAND 08/30/2021 2203   SPECREQUEST  08/30/2021 2203    BOTTLES DRAWN AEROBIC AND ANAEROBIC Blood Culture adequate volume   CULT  08/30/2021 2203    NO GROWTH 3 DAYS Performed at [facility], [place] [address] [place], [place] [address]    REPTSTATUS PENDING 08/30/2021 2203    Other culture-see note  Unresulted Labs (From admission, onward)     Start     Ordered   09/02/21 0500  CBC  Daily,   R      09/01/21 1433  Data Reviewed: I have personally reviewed following labs and imaging studies CBC: Recent Labs  Lab 08/30/21 1223 08/30/21 1427 08/31/21 0553 09/02/21 0322 09/03/21 0205  WBC 9.4  --  9.4 7.7 7.3  NEUTROABS 7.5  --   --   --   --   HGB 11.3* 13.3 14.6 12.3* 12.8*  HCT 33.4* 39.0 45.3 36.2* 37.8*  MCV 96.0  --  95.8 94.3 94.5  PLT 127*  --  163 190 161   Basic Metabolic Panel: Recent Labs  Lab 08/30/21 1223 08/30/21 1427 08/31/21 0553 09/01/21 0943 09/02/21 0322  NA 127* 129* 129* 140 138  K 4.8 4.7 4.7 3.8 3.9  CL 100  --  103 104 109  CO2 13*  --  19* 20* 20*  GLUCOSE 98  --  94 117* 99  BUN 26*  --  40* 22 17  CREATININE 1.35*  --  2.27* 1.61* 1.38*  CALCIUM 7.3*  --  8.0* 7.7* 8.2*   GFR: Estimated Creatinine Clearance: 58 mL/min (A) (by C-G formula based on SCr of 1.38 mg/dL (H)). Liver Function Tests: Recent Labs  Lab 08/30/21 1223  AST 13*  ALT 9  ALKPHOS 30*  BILITOT 0.8  PROT 4.0*  ALBUMIN 1.6*   Recent Labs  Lab 08/30/21 1223  LIPASE 32   No results for input(s): AMMONIA in the last 168 hours. Coagulation Profile: No results for input(s): INR, PROTIME in the last 168 hours. Cardiac Enzymes: No results for input(s): CKTOTAL, CKMB, CKMBINDEX, TROPONINI in the last 168 hours. BNP (last 3 results) No results for input(s): PROBNP in the last 8760 hours. HbA1C: Recent Labs    09/02/21 0322  HGBA1C 5.8*   CBG: Recent Labs  Lab  08/31/21 1956 08/31/21 2200 09/01/21 0226 09/02/21 0732  GLUCAP 141* 235* 100* 90   Lipid Profile: No results for input(s): CHOL, HDL, LDLCALC, TRIG, CHOLHDL, LDLDIRECT in the last 72 hours. Thyroid Function Tests: No results for input(s): TSH, T4TOTAL, FREET4, T3FREE, THYROIDAB in the last 72 hours. Anemia Panel: No results for input(s): VITAMINB12, FOLATE, FERRITIN, TIBC, IRON, RETICCTPCT in the last 72 hours. Sepsis Labs: Recent Labs  Lab 08/30/21 1223 08/30/21 1529 08/31/21 0241  LATICACIDVEN >9.0* 1.3 1.6    Recent Results (from the past 240 hour(s))  Resp Panel by RT-PCR (Flu A&B, Covid) Nasopharyngeal Swab     Status: None   Collection Time: 08/30/21  2:23 PM   Specimen: Nasopharyngeal Swab; Nasopharyngeal(NP) swabs in vial transport medium  Result Value Ref Range Status   SARS Coronavirus 2 by RT PCR NEGATIVE NEGATIVE Final    Comment: (NOTE) SARS-CoV-2 target nucleic acids are NOT DETECTED.  The SARS-CoV-2 RNA is generally detectable in upper respiratory specimens during the acute phase of infection. The lowest concentration of SARS-CoV-2 viral copies this assay can detect is 138 copies/mL. A negative result does not preclude SARS-Cov-2 infection and should not be used as the sole basis for treatment or other patient management decisions. A negative result may occur with  improper specimen collection/handling, submission of specimen other than nasopharyngeal swab, presence of viral mutation(s) within the areas targeted by this assay, and inadequate number of viral copies(<138 copies/mL). A negative result must be combined with clinical observations, patient history, and epidemiological information. The expected result is Negative.  Fact Sheet for Patients:  EntrepreneurPulse.com.au  Fact Sheet for Healthcare Providers:  IncredibleEmployment.be  This test is no t yet approved or cleared by the [place] FDA and  has  been authorized for detection and/or diagnosis of SARS-CoV-2 by FDA under an Emergency Use Authorization (EUA). This EUA will remain  in effect (meaning this test can be used) for the duration of the COVID-19 declaration under Section 564(b)(1) of the Act, 21 U.S.C.section 360bbb-3(b)(1), unless the authorization is terminated  or revoked sooner.       Influenza A by PCR NEGATIVE NEGATIVE Final   Influenza B by PCR NEGATIVE NEGATIVE Final    Comment: (NOTE) The Xpert Xpress SARS-CoV-2/FLU/RSV plus assay is intended as an aid in the diagnosis of influenza from Nasopharyngeal swab specimens and should not be used as a sole basis for treatment. Nasal washings and aspirates are unacceptable for Xpert Xpress SARS-CoV-2/FLU/RSV testing.  Fact Sheet for Patients: EntrepreneurPulse.com.au  Fact Sheet for Healthcare Providers: IncredibleEmployment.be  This test is not yet approved or cleared by the [place] FDA and has been authorized for detection and/or diagnosis of SARS-CoV-2 by FDA under an Emergency Use Authorization (EUA). This EUA will remain in effect (meaning this test can be used) for the duration of the COVID-19 declaration under Section 564(b)(1) of the Act, 21 U.S.C. section 360bbb-3(b)(1), unless the authorization is terminated or revoked.  Performed at [facility], [place] [address] [place], [place] [address]   Urine Culture     Status: None   Collection Time: 08/30/21  3:25 PM   Specimen: Urine, Clean Catch  Result Value Ref Range Status   Specimen Description URINE, CLEAN CATCH  Final   Special Requests NONE  Final   Culture   Final    NO GROWTH Performed at [facility], [place] [address] [place], [place] [address]    Report Status 09/01/2021 FINAL  Final  Culture, blood (routine x 2)     Status: None (Preliminary result)   Collection Time: 08/30/21  9:20 PM   Specimen: BLOOD  Result Value Ref Range Status    Specimen Description BLOOD LEFT ARM  Final   Special Requests   Final    BOTTLES DRAWN AEROBIC AND ANAEROBIC Blood Culture results may not be optimal due to an inadequate volume of blood received in culture bottles   Culture   Final    NO GROWTH 4 DAYS Performed at [facility], [place] [address], [place], [place] [address]    Report Status PENDING  Incomplete  Culture, blood (routine x 2)     Status: None (Preliminary result)   Collection Time: 08/30/21 10:03 PM   Specimen: BLOOD LEFT HAND  Result Value Ref Range Status   Specimen Description BLOOD LEFT HAND  Final   Special Requests   Final    BOTTLES DRAWN AEROBIC AND ANAEROBIC Blood Culture adequate volume   Culture   Final    NO GROWTH 3 DAYS Performed at [facility], [place] [address] [place], [place] [address]    Report Status PENDING  Incomplete     Radiology Studies: DG Knee 1-2 Views Left  Result Date: 09/03/2021 CLINICAL DATA:  Bilateral knee pain and swelling for months Unable to walk in the morning due to severe pain No known injury EXAM: LEFT KNEE - 1-2 VIEW; RIGHT KNEE - 1-2 VIEW COMPARISON:  04/24/2018 12/11/2015 FINDINGS: Right knee: Chondrocalcinosis noted in the medial and lateral compartments. No significant joint space loss. No fracture or dislocation. Soft tissue calcifications anterior to the distal femur likely sequelae of remote trauma. The distal quadriceps tendon is thickened. Left knee: Mild spurring and joint space  loss of the medial compartment. Chondrocalcinosis of the medial and lateral compartments. Mild spurring of the patellofemoral compartment. Enthesopathic changes seen at the upper pole of the patella at the insertion of the quadriceps tendon. The distal quadriceps tendon is thickened. IMPRESSION: 1. Mild degenerative changes of the knees, greater on the left. 2. Thickening of the distal quadriceps tendon suggestive of tendinosis. 3. Soft tissue calcifications noted within the quadriceps muscle  and tendon, greater on the right. Findings have increased since prior radiographs of the left knee from 12/11/2015 and right knee from 04/24/2018. This may be sequelae of trauma. Alternatively myositis may also produce similar findings. Electronically Signed   By: Miachel Roux M.D.   On: 09/03/2021 10:59   DG Knee 1-2 Views Right  Result Date: 09/03/2021 CLINICAL DATA:  Bilateral knee pain and swelling for months Unable to walk in the morning due to severe pain No known injury EXAM: LEFT KNEE - 1-2 VIEW; RIGHT KNEE - 1-2 VIEW COMPARISON:  04/24/2018 12/11/2015 FINDINGS: Right knee: Chondrocalcinosis noted in the medial and lateral compartments. No significant joint space loss. No fracture or dislocation. Soft tissue calcifications anterior to the distal femur likely sequelae of remote trauma. The distal quadriceps tendon is thickened. Left knee: Mild spurring and joint space loss of the medial compartment. Chondrocalcinosis of the medial and lateral compartments. Mild spurring of the patellofemoral compartment. Enthesopathic changes seen at the upper pole of the patella at the insertion of the quadriceps tendon. The distal quadriceps tendon is thickened. IMPRESSION: 1. Mild degenerative changes of the knees, greater on the left. 2. Thickening of the distal quadriceps tendon suggestive of tendinosis. 3. Soft tissue calcifications noted within the quadriceps muscle and tendon, greater on the right. Findings have increased since prior radiographs of the left knee from 12/11/2015 and right knee from 04/24/2018. This may be sequelae of trauma. Alternatively myositis may also produce similar findings. Electronically Signed   By: Miachel Roux M.D.   On: 09/03/2021 10:59     LOS: 4 days   Antonieta Pert, MD [facility]  09/03/2021, 3:57 PM

## 2021-09-04 LAB — CBC
HCT: 39.1 % (ref 39.0–52.0)
Hemoglobin: 12.7 g/dL — ABNORMAL LOW (ref 13.0–17.0)
MCH: 30.8 pg (ref 26.0–34.0)
MCHC: 32.5 g/dL (ref 30.0–36.0)
MCV: 94.7 fL (ref 80.0–100.0)
Platelets: 224 10*3/uL (ref 150–400)
RBC: 4.13 MIL/uL — ABNORMAL LOW (ref 4.22–5.81)
RDW: 13.2 % (ref 11.5–15.5)
WBC: 6.1 10*3/uL (ref 4.0–10.5)
nRBC: 0 % (ref 0.0–0.2)

## 2021-09-04 LAB — CULTURE, BLOOD (ROUTINE X 2): Culture: NO GROWTH

## 2021-09-04 NOTE — TOC Transition Note (Signed)
Transition of Care [facility]) - CM/SW Discharge Note   Patient Details  Name: Todd [name]. MRN: [id number] Date of Birth: [birth year]  [facility] of [facility] CM/SW Contact:  Milas Gain, [place] Phone Number: 09/04/2021, 10:01 AM   Clinical Narrative:     Patient will DC to: [facility] [address]  Anticipated DC date: 09/04/2021  Family notified: Patient Declined   Transport by: [facility] to Door   ?  Per MD patient ready for DC to [place] [facility] [id number] . RN, patient, notified of DC.[facility] Door requested for patient.  CSW signing off.    Final next level of care: Home/Self Care Barriers to Discharge: No Barriers Identified   Patient Goals and CMS Choice Patient states their goals for this hospitalization and ongoing recovery are:: to go home/motel CMS Medicare.gov Compare Post Acute Care list provided to:: Patient Choice offered to / list presented to : Patient  Discharge Placement                Patient to be transferred to facility by: [facility] door to door Name of family member notified: Patient declined Patient and family notified of of transfer: 09/04/21  Discharge Plan and Services                                     Social Determinants of Health (SDOH) Interventions     Readmission Risk Interventions Readmission Risk Prevention Plan 09/02/2021 09/16/2020  Transportation Screening Complete Complete  PCP or Specialist Appt within 5-7 Days - Complete  Home Care Screening - Complete  Medication Review (RN CM) - Referral to Pharmacy  [place] or Home Care Consult Complete -  Social Work Consult for [place] Planning/Counseling Complete -  Palliative Care Screening Not Applicable -  Some recent data might be hidden

## 2021-09-04 NOTE — Discharge Summary (Signed)
Physician Discharge Summary  Todd [name]. GNO:[id number] DOB: 02-Aug-1956 DOA: 08/30/2021  PCP: Todd Carol, MD  Admit date: 08/30/2021 Discharge date: 09/04/2021  Admitted From: hotel Disposition:  hotel  Recommendations for Outpatient Follow-up:  Follow up with PCP in 1-2 weeks Please obtain BMP/CBC in one week   Home Health:no  Equipment/Devices: none  Discharge Condition: Stable Code Status:   Code Status: Full Code Diet recommendation:  Diet Order             Diet - low sodium heart healthy           DIET SOFT Room service appropriate? Yes; Fluid consistency: Thin  Diet effective now                    Brief/Interim Summary:  66 y.o. male with PMH of history of PE and DVT, type 2 diabetes that is diet controlled, polysubstance abuse including alcohol and cocaine and homelessness presented to the hospital with increasing shortness of breath.  On arrival, CTA of the chest was negative for pulmonary embolism, it showed multiple new and old stable rib fractures, new superior endplate compression fracture T4 and T5 as well as possible left lower lobe atelectasis/bronchitis.  UDS positive for cocaine.  EKG consistent with A. fib with RVR. Patient was admitted treated for a flutter and A. fib with RVR with intermittent hypotension seen by cardiology.  He will continue with metoprolol anticoagulation with Eliquis. Presents complaining of chest pain due to multiple old fractures continue pain control with lidocaine patch Tylenol Flexeril.  Minimizing opiates due to his history. He remains stable for discharge home today. He is going back to hotel and transport is being set up.  Discharge Diagnoses:  AFlutter/ A FIB with RVR Intermittent hypotension: Treated with IV amiodarone, converted to NSR,was admittable previously resumed.TSH is stable TTE W/ preserved EF.  Continue metoprolol, Eliquis.  Patient has been educated on Eliquis use.   Pain secondary to multiple old rib  fractures acute left third rib fracture with hematoma:  Continue pain management lidocaine patch Tylenol Flexeril, avoid opiates due to known drug abuse history.  Patient had multiple episodes of fall related to intoxication and substance abuse likely contributed to the fractures.   Sepsis POA with lactic acidosis suspected UTI pneumonia: At this time vitals are stable urine culture no growth sepsis physiology resolved.  S/P Rocephin and azithromycin.  Keflex to be continued orally   Essential hypertension blood pressure stable continue amlodipine metoprolol GERD continue PPI Polysubstance abuse counseled to quit he is interested in detox, Homelessness: At this time patient wants feels unsafe for discharge today reports he is making few calls.  Toc involved provided list of shelter.  He is interested to go to inpatient detox-reports he is waiting to hear from them and has made a call this morning\   Bilateral knee pain history of right knee muscle injury.  X-ray done arthritis chronic changes nontender and no effusion exam, advised to fu with orthopedics, no provided.    Consults: cardiology  Subjective: Aaox3, no new complaints Wants to get out before 10 am today  Discharge Exam: Vitals:   09/03/21 2030 09/04/21 0501  BP: 133/90 139/89  Pulse: (!) 113 (!) 57  Resp: (!) 23 16  Temp: 99.1 F (37.3 C) 98.5 F (36.9 C)  SpO2: 95% 97%   General: Pt is alert, awake, not in acute distress Cardiovascular: RRR, S1/S2 +, no rubs, no gallops Respiratory: CTA bilaterally, no wheezing, no rhonchi  Abdominal: Soft, NT, ND, bowel sounds + Extremities: no edema, no cyanosis  Discharge Instructions  Discharge Instructions     (HEART FAILURE PATIENTS) Call MD:  Anytime you have any of the following symptoms: 1) 3 pound weight gain in 24 hours or 5 pounds in 1 week 2) shortness of breath, with or without a dry hacking cough 3) swelling in the hands, feet or stomach 4) if you have to sleep on  extra pillows at night in order to breathe.   Complete by: As directed    Call MD for:  difficulty breathing, headache or visual disturbances   Complete by: As directed    Call MD for:  severe uncontrolled pain   Complete by: As directed    Diet - low sodium heart healthy   Complete by: As directed    Discharge instructions   Complete by: As directed    Please call call MD or return to ER for similar or worsening recurring problem that brought you to hospital or if any fever,nausea/vomiting,abdominal pain, uncontrolled pain, chest pain,  shortness of breath or any other alarming symptoms.  Please follow-up your doctor as instructed in a week time and call the office for appointment.  Please avoid alcohol, smoking, or any other illicit substance and maintain healthy habits including taking your regular medications as prescribed.  You were cared for by a hospitalist during your hospital stay. If you have any questions about your discharge medications or the care you received while you were in the hospital after you are discharged, you can call the unit and ask to speak with the hospitalist on call if the hospitalist that took care of you is not available.  Once you are discharged, your primary care physician will handle any further medical issues. Please note that NO REFILLS for any discharge medications will be authorized once you are discharged, as it is imperative that you return to your primary care physician (or establish a relationship with a primary care physician if you do not have one) for your aftercare needs so that they can reassess your need for medications and monitor your lab values   Increase activity slowly   Complete by: As directed    Increase activity slowly   Complete by: As directed       Allergies as of 09/04/2021   No Known Allergies      Medication List     STOP taking these medications    albuterol (2.5 MG/3ML) 0.083% nebulizer solution Commonly known as:  PROVENTIL   aspirin EC 81 MG tablet   clotrimazole 1 % cream Commonly known as: LOTRIMIN   colchicine 0.6 MG tablet   dextromethorphan-guaiFENesin 30-600 MG 12hr tablet Commonly known as: MUCINEX DM   docusate sodium 100 MG capsule Commonly known as: COLACE   folic acid 1 MG tablet Commonly known as: FOLVITE   multivitamin with minerals Tabs tablet   polyethylene glycol 17 g packet Commonly known as: MIRALAX / GLYCOLAX   sertraline 25 MG tablet Commonly known as: ZOLOFT   sucralfate 1 GM/10ML suspension Commonly known as: CARAFATE   thiamine 100 MG tablet       TAKE these medications    Acetaminophen Extra Strength 500 MG tablet Generic drug: acetaminophen Take 2 tablets (1,000 mg total) by mouth every 6 (six) hours as needed for moderate pain, headache or mild pain. What changed:  medication strength how much to take when to take this reasons to take this   amLODipine 5  MG tablet Commonly known as: NORVASC Take 1 tablet (5 mg total) by mouth daily.   cephALEXin 500 MG capsule Commonly known as: KEFLEX Take 1 capsule (500 mg total) by mouth every 8 (eight) hours for 3 days.   cyclobenzaprine 5 MG tablet Commonly known as: FLEXERIL Take 1 tablet (5 mg total) by mouth 3 (three) times daily as needed for muscle spasms.   Eliquis 5 MG Tabs tablet Generic drug: apixaban Take 1 tablet (5 mg total) by mouth 2 (two) times daily.   metoprolol tartrate 25 MG tablet Commonly known as: LOPRESSOR Take 1 tablet (25 mg total) by mouth 2 (two) times daily.   pantoprazole 40 MG tablet Commonly known as: PROTONIX Take 1 tablet (40 mg total) by mouth at bedtime.        Follow-up Information     Todd Carol, MD Follow up in 1 week(s).   Specialty: Internal Medicine Contact information: [address] [place] [address] [phone]         Troy Sine, MD .   Specialty: Cardiology Contact information: [address] [place] [address] [phone]                No Known Allergies  The results of significant diagnostics from this hospitalization (including imaging, microbiology, ancillary and laboratory) are listed below for reference.    Microbiology: Recent Results (from the past 240 hour(s))  Resp Panel by RT-PCR (Flu A&B, Covid) Nasopharyngeal Swab     Status: None   Collection Time: 08/30/21  2:23 PM   Specimen: Nasopharyngeal Swab; Nasopharyngeal(NP) swabs in vial transport medium  Result Value Ref Range Status   SARS Coronavirus 2 by RT PCR NEGATIVE NEGATIVE Final    Comment: (NOTE) SARS-CoV-2 target nucleic acids are NOT DETECTED.  The SARS-CoV-2 RNA is generally detectable in upper respiratory specimens during the acute phase of infection. The lowest concentration of SARS-CoV-2 viral copies this assay can detect is 138 copies/mL. A negative result does not preclude SARS-Cov-2 infection and should not be used as the sole basis for treatment or other patient management decisions. A negative result may occur with  improper specimen collection/handling, submission of specimen other than nasopharyngeal swab, presence of viral mutation(s) within the areas targeted by this assay, and inadequate number of viral copies(<138 copies/mL). A negative result must be combined with clinical observations, patient history, and epidemiological information. The expected result is Negative.  Fact Sheet for Patients:  EntrepreneurPulse.com.au  Fact Sheet for Healthcare Providers:  IncredibleEmployment.be  This test is no t yet approved or cleared by the [place] FDA and  has been authorized for detection and/or diagnosis of SARS-CoV-2 by FDA under an Emergency Use Authorization (EUA). This EUA will remain  in effect (meaning this test can be used) for the duration of the COVID-19 declaration under Section 564(b)(1) of the Act,  21 U.S.C.section 360bbb-3(b)(1), unless the authorization is terminated  or revoked sooner.       Influenza A by PCR NEGATIVE NEGATIVE Final   Influenza B by PCR NEGATIVE NEGATIVE Final    Comment: (NOTE) The Xpert Xpress SARS-CoV-2/FLU/RSV plus assay is intended as an aid in the diagnosis of influenza from Nasopharyngeal swab specimens and should not be used as a sole basis for treatment. Nasal washings and aspirates are unacceptable for Xpert Xpress SARS-CoV-2/FLU/RSV testing.  Fact Sheet for Patients: EntrepreneurPulse.com.au  Fact Sheet for Healthcare Providers: IncredibleEmployment.be  This test is not yet approved or cleared  by the [place] and has been authorized for detection and/or diagnosis of SARS-CoV-2 by FDA under an Emergency Use Authorization (EUA). This EUA will remain in effect (meaning this test can be used) for the duration of the COVID-19 declaration under Section 564(b)(1) of the Act, 21 U.S.C. section 360bbb-3(b)(1), unless the authorization is terminated or revoked.  Performed at [facility], [place] [address] [place], [place] [address]   Urine Culture     Status: None   Collection Time: 08/30/21  3:25 PM   Specimen: Urine, Clean Catch  Result Value Ref Range Status   Specimen Description URINE, CLEAN CATCH  Final   Special Requests NONE  Final   Culture   Final    NO GROWTH Performed at [facility], [place] [address] [place], [place] [address]    Report Status 09/01/2021 FINAL  Final  Culture, blood (routine x 2)     Status: None (Preliminary result)   Collection Time: 08/30/21  9:20 PM   Specimen: BLOOD  Result Value Ref Range Status   Specimen Description BLOOD LEFT ARM  Final   Special Requests   Final    BOTTLES DRAWN AEROBIC AND ANAEROBIC Blood Culture results may not be optimal due to an inadequate volume of blood received in culture bottles   Culture   Final    NO GROWTH 4  DAYS Performed at [facility], [place] [address] [place], [place] [address]    Report Status PENDING  Incomplete  Culture, blood (routine x 2)     Status: None (Preliminary result)   Collection Time: 08/30/21 10:03 PM   Specimen: BLOOD LEFT HAND  Result Value Ref Range Status   Specimen Description BLOOD LEFT HAND  Final   Special Requests   Final    BOTTLES DRAWN AEROBIC AND ANAEROBIC Blood Culture adequate volume   Culture   Final    NO GROWTH 3 DAYS Performed at [facility], [place] [address] [place],  [address]    Report Status PENDING  Incomplete    Procedures/Studies: DG Knee 1-2 Views Left  Result Date: 09/03/2021 CLINICAL DATA:  Bilateral knee pain and swelling for months Unable to walk in the morning due to severe pain No known injury EXAM: LEFT KNEE - 1-2 VIEW; RIGHT KNEE - 1-2 VIEW COMPARISON:  04/24/2018 12/11/2015 FINDINGS: Right knee: Chondrocalcinosis noted in the medial and lateral compartments. No significant joint space loss. No fracture or dislocation. Soft tissue calcifications anterior to the distal femur likely sequelae of remote trauma. The distal quadriceps tendon is thickened. Left knee: Mild spurring and joint space loss of the medial compartment. Chondrocalcinosis of the medial and lateral compartments. Mild spurring of the patellofemoral compartment. Enthesopathic changes seen at the upper pole of the patella at the insertion of the quadriceps tendon. The distal quadriceps tendon is thickened. IMPRESSION: 1. Mild degenerative changes of the knees, greater on the left. 2. Thickening of the distal quadriceps tendon suggestive of tendinosis. 3. Soft tissue calcifications noted within the quadriceps muscle and tendon, greater on the right. Findings have increased since prior radiographs of the left knee from 12/11/2015 and right knee from 04/24/2018. This may be sequelae of trauma. Alternatively myositis may also produce similar findings. Electronically  Signed   By: Miachel Roux M.D.   On: 09/03/2021 10:59   DG Knee 1-2 Views Right  Result Date: 09/03/2021 CLINICAL DATA:  Bilateral knee pain and swelling for months Unable to walk  in the morning due to severe pain No known injury EXAM: LEFT KNEE - 1-2 VIEW; RIGHT KNEE - 1-2 VIEW COMPARISON:  04/24/2018 12/11/2015 FINDINGS: Right knee: Chondrocalcinosis noted in the medial and lateral compartments. No significant joint space loss. No fracture or dislocation. Soft tissue calcifications anterior to the distal femur likely sequelae of remote trauma. The distal quadriceps tendon is thickened. Left knee: Mild spurring and joint space loss of the medial compartment. Chondrocalcinosis of the medial and lateral compartments. Mild spurring of the patellofemoral compartment. Enthesopathic changes seen at the upper pole of the patella at the insertion of the quadriceps tendon. The distal quadriceps tendon is thickened. IMPRESSION: 1. Mild degenerative changes of the knees, greater on the left. 2. Thickening of the distal quadriceps tendon suggestive of tendinosis. 3. Soft tissue calcifications noted within the quadriceps muscle and tendon, greater on the right. Findings have increased since prior radiographs of the left knee from 12/11/2015 and right knee from 04/24/2018. This may be sequelae of trauma. Alternatively myositis may also produce similar findings. Electronically Signed   By: Miachel Roux M.D.   On: 09/03/2021 10:59   CT Angio Chest PE W and/or Wo Contrast  Result Date: 08/30/2021 CLINICAL DATA:  Pulmonary embolism (PE) suspected, high prob. Left rib pain EXAM: CT ANGIOGRAPHY CHEST WITH CONTRAST TECHNIQUE: Multidetector CT imaging of the chest was performed using the standard protocol during bolus administration of intravenous contrast. Multiplanar CT image reconstructions and MIPs were obtained to evaluate the vascular anatomy. RADIATION DOSE REDUCTION: This exam was performed according to the departmental  dose-optimization program which includes automated exposure control, adjustment of the mA and/or kV according to patient size and/or use of iterative reconstruction technique. CONTRAST:  158mL OMNIPAQUE IOHEXOL 350 MG/ML SOLN COMPARISON:  CT 09/08/2018, 12/03/2009 FINDINGS: Cardiovascular: Satisfactory opacification of the pulmonary arteries to the segmental level. No evidence of pulmonary embolism. Pulmonary trunk measures 3.6 cm in diameter. Ascending thoracic aorta measures up to 3.9 cm in diameter, stable. Scattered atherosclerotic calcification of the aorta and coronary arteries. Normal heart size. No pericardial effusion. Mediastinum/Nodes: No enlarged mediastinal, hilar, or axillary lymph nodes. Thyroid gland and trachea demonstrate no significant findings. Mild circumferential wall thickening of the distal esophagus. Lungs/Pleura: Interlobular septal thickening within the lower lobes, more pronounced on the left with there is mild hazy ground-glass opacity. Subpleural reticulation within the upper lobes. Minimal centrilobular and paraseptal emphysema. New 4 mm noncalcified pulmonary nodule in the right lower lobe (series 6, image 97). No pleural effusion or pneumothorax. Upper Abdomen: Reflux of contrast into the IVC and hepatic veins. No acute findings within the visualized upper abdomen. Unchanged 2.3 cm right adrenal gland nodule, stable since 2011 and benign. Musculoskeletal: New mild superior endplate compression fractures of T4 and T5. No traumatic listhesis. Acute mildly displaced fracture of the left third rib laterally with adjacent soft tissue stranding and ill-defined hematoma (series 5, image 47). Multiple additional healed nondisplaced bilateral rib fractures. Review of the MIP images confirms the above findings. IMPRESSION: 1. No evidence of acute pulmonary embolism. 2. Acute mildly displaced fracture of the left third rib laterally with adjacent soft tissue stranding and ill-defined hematoma.  3. New mild superior endplate compression fractures of T4 and T5. 4. Interlobular septal thickening within the lower lobes, more pronounced on the left with there is mild hazy ground-glass opacity. Findings may represent mild edema versus atypical/viral infection. 5. Mild circumferential wall thickening of the distal esophagus, which may represent esophagitis. 6. Reflux of contrast into the  IVC and hepatic veins, suggesting right heart dysfunction. 7. New 4 mm noncalcified pulmonary nodule in the right lower lobe. No follow-up needed if patient is low-risk. Non-contrast chest CT can be considered in 12 months if patient is high-risk. This recommendation follows the consensus statement: Guidelines for Management of Incidental Pulmonary Nodules Detected on CT Images: From the Fleischner Society 2017; Radiology 2017; 284:228-243. Aortic and coronary artery atherosclerosis (ICD10-I70.0). Electronically Signed   By: Davina Poke D.O.   On: 08/30/2021 15:19   CT Abdomen Pelvis W Contrast  Result Date: 08/30/2021 CLINICAL DATA:  Blunt abdominal trauma. Pain in the left rib cage and left upper abdomen. EXAM: CT ABDOMEN AND PELVIS WITH CONTRAST TECHNIQUE: Multidetector CT imaging of the abdomen and pelvis was performed using the standard protocol following bolus administration of intravenous contrast. RADIATION DOSE REDUCTION: This exam was performed according to the departmental dose-optimization program which includes automated exposure control, adjustment of the mA and/or kV according to patient size and/or use of iterative reconstruction technique. CONTRAST:  110mL OMNIPAQUE IOHEXOL 350 MG/ML SOLN, 62mL OMNIPAQUE IOHEXOL 300 MG/ML SOLN COMPARISON:  June 19, 2019 FINDINGS: Lower chest: Healed or healing left eleventh rib fracture. No acute findings. Hepatobiliary: No focal liver abnormality is seen. Status post cholecystectomy. No biliary dilatation. Pancreas: Unremarkable. No pancreatic ductal dilatation or  surrounding inflammatory changes. Spleen: No splenic injury or perisplenic hematoma. Adrenals/Urinary Tract: Stable in size 2.3 cm right adrenal mass. Bilateral renal cysts, as well as some hypoattenuated masses in bilateral kidneys which are too small to be actually characterize by CT. Stomach/Bowel: Small hiatal hernia. Stomach is within normal limits. Appendix appears normal. No evidence of bowel wall thickening, distention, or inflammatory changes. Scattered left colonic diverticulosis without evidence of diverticulitis. Vascular/Lymphatic: No significant vascular findings are present. No enlarged abdominal or pelvic lymph nodes. Reproductive: Surgically absent prostate gland. Other: Anterior abdominal wall repair. Musculoskeletal: No acute fracture is seen. IMPRESSION: 1. No evidence of acute traumatic injury to the abdomen or pelvis. 2. Healed or healing left eleventh rib fracture. 3. Bilateral renal cysts, as well as some hypoattenuated masses in bilateral kidneys which are too small to be actually characterize by CT. 4. Small hiatal hernia. 5. Stable in size 2.3 cm right adrenal mass, likely benign. Electronically Signed   By: Fidela Salisbury M.D.   On: 08/30/2021 16:49   DG Chest Port 1 View  Result Date: 08/30/2021 CLINICAL DATA:  Short of breath.  Left-sided chest wall pain. EXAM: PORTABLE CHEST 1 VIEW COMPARISON:  10/07/2020. FINDINGS: Cardiac silhouette is normal in size. No mediastinal or hilar masses. Chronic interstitial thickening, most evident in the left mid and lower lung, stable. No evidence of pneumonia or pulmonary edema. No convincing pleural effusion and no pneumothorax. Skeletal structures are grossly intact. IMPRESSION: No acute cardiopulmonary disease. Electronically Signed   By: Lajean Manes M.D.   On: 08/30/2021 12:21   ECHOCARDIOGRAM COMPLETE  Result Date: 08/31/2021    ECHOCARDIOGRAM REPORT   Patient Name:   Reed Dady. Date of Exam: 08/31/2021 Medical Rec #:   [id number]          Height:       69.0 in Accession #:    [id number]         Weight:       220.0 lb Date of Birth:  [birth year]          BSA:          2.151 m Patient Age:    8  years           BP:           104/75 mmHg Patient Gender: M                  HR:           80 bpm. Exam Location:  Inpatient Procedure: 2D Echo, Cardiac Doppler and Color Doppler Indications:    AFLUTTER  History:        Patient has prior history of Echocardiogram examinations, most                 recent 09/05/2020. Risk Factors:Hypertension and Diabetes. PULM.                 EMBOLI / HYPERCHOLESTEROLEMIA.  Sonographer:    Beryle Beams Referring Phys: [id number] [place] T TU IMPRESSIONS  1. Left ventricular ejection fraction, by estimation, is 60 to 65%. The left ventricle has normal function. The left ventricle has no regional wall motion abnormalities. There is mild left ventricular hypertrophy. Left ventricular diastolic parameters are consistent with Grade I diastolic dysfunction (impaired relaxation).  2. Right ventricular systolic function is normal. The right ventricular size is normal. The estimated right ventricular systolic pressure is 16.6 mmHg.  3. Left atrial size was moderately dilated.  4. The mitral valve is abnormal. Trivial mitral valve regurgitation.  5. The aortic valve is tricuspid. Aortic valve regurgitation is not visualized.  6. The inferior vena cava is dilated in size with >50% respiratory variability, suggesting right atrial pressure of 8 mmHg.  7. Rhythm strip during this exam demostrated normal sinus rhythm. Comparison(s): No significant change from prior study. 09/05/2020: LVEF 60-65%, grade 1 DD, mild MR. FINDINGS  Left Ventricle: Left ventricular ejection fraction, by estimation, is 60 to 65%. The left ventricle has normal function. The left ventricle has no regional wall motion abnormalities. The left ventricular internal cavity size was normal in size. There is  mild left ventricular hypertrophy. Left ventricular  diastolic parameters are consistent with Grade I diastolic dysfunction (impaired relaxation). Indeterminate filling pressures. Right Ventricle: The right ventricular size is normal. No increase in right ventricular wall thickness. Right ventricular systolic function is normal. The tricuspid regurgitant velocity is 2.43 m/s, and with an assumed right atrial pressure of 8 mmHg, the estimated right ventricular systolic pressure is 06.3 mmHg. Left Atrium: Left atrial size was moderately dilated. Right Atrium: Right atrial size was normal in size. Pericardium: There is no evidence of pericardial effusion. Mitral Valve: The mitral valve is abnormal. There is mild thickening of the mitral valve leaflet(s). Trivial mitral valve regurgitation. MV peak gradient, 68.9 mmHg. The mean mitral valve gradient is 41.0 mmHg. Tricuspid Valve: The tricuspid valve is grossly normal. Tricuspid valve regurgitation is trivial. Aortic Valve: The aortic valve is tricuspid. Aortic valve regurgitation is not visualized. Aortic valve mean gradient measures 6.0 mmHg. Aortic valve peak gradient measures 9.2 mmHg. Aortic valve area, by VTI measures 3.57 cm. Pulmonic Valve: The pulmonic valve was normal in structure. Pulmonic valve regurgitation is not visualized. Aorta: The aortic root and ascending aorta are structurally normal, with no evidence of dilitation. Venous: The inferior vena cava is dilated in size with greater than 50% respiratory variability, suggesting right atrial pressure of 8 mmHg. IAS/Shunts: No atrial level shunt detected by color flow Doppler. EKG: Rhythm strip during this exam demostrated normal sinus rhythm.  LEFT VENTRICLE PLAX 2D LVIDd:         4.40 cm  Diastology LVIDs:         2.80 cm     LV e' medial:    6.20 cm/s LV PW:         1.60 cm     LV E/e' medial:  11.7 LV IVS:        0.80 cm     LV e' lateral:   11.00 cm/s LVOT diam:     2.10 cm     LV E/e' lateral: 6.6 LV SV:         102 LV SV Index:   47 LVOT Area:      3.46 cm  LV Volumes (MOD) LV vol d, MOD A2C: 65.3 ml LV vol d, MOD A4C: 96.4 ml LV vol s, MOD A2C: 35.8 ml LV vol s, MOD A4C: 45.2 ml LV SV MOD A2C:     29.5 ml LV SV MOD A4C:     96.4 ml LV SV MOD BP:      41.7 ml RIGHT VENTRICLE             IVC RV S prime:     12.70 cm/s  IVC diam: 2.10 cm RVOT diam:      2.70 cm TAPSE (M-mode): 1.8 cm LEFT ATRIUM             Index        RIGHT ATRIUM           Index LA diam:        4.10 cm 1.91 cm/m   RA Area:     18.90 cm LA Vol (A2C):   59.8 ml 27.80 ml/m  RA Volume:   56.00 ml  26.03 ml/m LA Vol (A4C):   92.1 ml 42.81 ml/m LA Biplane Vol: 76.6 ml 35.60 ml/m  AORTIC VALVE                     PULMONIC VALVE AV Area (Vmax):    3.30 cm      PV Vmax:       0.63 m/s AV Area (Vmean):   3.19 cm      PV Peak grad:  1.6 mmHg AV Area (VTI):     3.57 cm AV Vmax:           152.00 cm/s AV Vmean:          114.000 cm/s AV VTI:            0.286 m AV Peak Grad:      9.2 mmHg AV Mean Grad:      6.0 mmHg LVOT Vmax:         145.00 cm/s LVOT Vmean:        105.000 cm/s LVOT VTI:          0.295 m LVOT/AV VTI ratio: 1.03  AORTA Ao Root diam: 3.10 cm Ao Asc diam:  3.00 cm MITRAL VALVE               TRICUSPID VALVE MV Area (PHT): 2.88 cm    TV Peak grad:   23.8 mmHg MV Area VTI:   1.14 cm    TV Mean grad:   16.0 mmHg MV Peak grad:  68.9 mmHg   TV Vmax:        2.44 m/s MV Mean grad:  41.0 mmHg   TV Vmean:       193.0 cm/s MV Vmax:       4.15 m/s    TV VTI:  0.66 msec MV Vmean:      287.0 cm/s  TR Peak grad:   23.6 mmHg MV Decel Time: 263 msec    TR Vmax:        243.00 cm/s MV E velocity: 72.80 cm/s MV A velocity: 75.00 cm/s  SHUNTS MV E/A ratio:  0.97        Systemic VTI:  0.30 m                            Systemic Diam: 2.10 cm                            Pulmonic Diam: 2.70 cm Lyman Bishop MD Electronically signed by Lyman Bishop MD Signature Date/Time: 08/31/2021/11:34:00 AM    Final     Labs: BNP (last 3 results) Recent Labs    09/08/20 0453 09/11/20 0658 09/14/20 0418   BNP 324.2* 67.0 80.0   Basic Metabolic Panel: Recent Labs  Lab 08/30/21 1223 08/30/21 1427 08/31/21 0553 09/01/21 0943 09/02/21 0322  NA 127* 129* 129* 140 138  K 4.8 4.7 4.7 3.8 3.9  CL 100  --  103 104 109  CO2 13*  --  19* 20* 20*  GLUCOSE 98  --  94 117* 99  BUN 26*  --  40* 22 17  CREATININE 1.35*  --  2.27* 1.61* 1.38*  CALCIUM 7.3*  --  8.0* 7.7* 8.2*   Liver Function Tests: Recent Labs  Lab 08/30/21 1223  AST 13*  ALT 9  ALKPHOS 30*  BILITOT 0.8  PROT 4.0*  ALBUMIN 1.6*   Recent Labs  Lab 08/30/21 1223  LIPASE 32   No results for input(s): AMMONIA in the last 168 hours. CBC: Recent Labs  Lab 08/30/21 1223 08/30/21 1427 08/31/21 0553 09/02/21 0322 09/03/21 0205 09/04/21 0246  WBC 9.4  --  9.4 7.7 7.3 6.1  NEUTROABS 7.5  --   --   --   --   --   HGB 11.3* 13.3 14.6 12.3* 12.8* 12.7*  HCT 33.4* 39.0 45.3 36.2* 37.8* 39.1  MCV 96.0  --  95.8 94.3 94.5 94.7  PLT 127*  --  163 190 189 224   Cardiac Enzymes: No results for input(s): CKTOTAL, CKMB, CKMBINDEX, TROPONINI in the last 168 hours. BNP: Invalid input(s): POCBNP CBG: Recent Labs  Lab 08/31/21 1956 08/31/21 2200 09/01/21 0226 09/02/21 0732  GLUCAP 141* 235* 100* 90   D-Dimer No results for input(s): DDIMER in the last 72 hours. Hgb A1c Recent Labs    09/02/21 0322  HGBA1C 5.8*   Lipid Profile No results for input(s): CHOL, HDL, LDLCALC, TRIG, CHOLHDL, LDLDIRECT in the last 72 hours. Thyroid function studies No results for input(s): TSH, T4TOTAL, T3FREE, THYROIDAB in the last 72 hours.  Invalid input(s): FREET3 Anemia work up No results for input(s): VITAMINB12, FOLATE, FERRITIN, TIBC, IRON, RETICCTPCT in the last 72 hours. Urinalysis    Component Value Date/Time   COLORURINE YELLOW 08/30/2021 1420   APPEARANCEUR CLOUDY (A) 08/30/2021 1420   LABSPEC 1.020 08/30/2021 1420   PHURINE 6.0 08/30/2021 1420   GLUCOSEU NEGATIVE 08/30/2021 1420   HGBUR LARGE (A) 08/30/2021 1420    BILIRUBINUR NEGATIVE 08/30/2021 1420   KETONESUR NEGATIVE 08/30/2021 1420   PROTEINUR 100 (A) 08/30/2021 1420   UROBILINOGEN 0.2 12/24/2009 1944   NITRITE POSITIVE (A) 08/30/2021 1420   LEUKOCYTESUR LARGE (A) 08/30/2021 1420  Sepsis Labs Invalid input(s): PROCALCITONIN,  WBC,  LACTICIDVEN Microbiology Recent Results (from the past 240 hour(s))  Resp Panel by RT-PCR (Flu A&B, Covid) Nasopharyngeal Swab     Status: None   Collection Time: 08/30/21  2:23 PM   Specimen: Nasopharyngeal Swab; Nasopharyngeal(NP) swabs in vial transport medium  Result Value Ref Range Status   SARS Coronavirus 2 by RT PCR NEGATIVE NEGATIVE Final    Comment: (NOTE) SARS-CoV-2 target nucleic acids are NOT DETECTED.  The SARS-CoV-2 RNA is generally detectable in upper respiratory specimens during the acute phase of infection. The lowest concentration of SARS-CoV-2 viral copies this assay can detect is 138 copies/mL. A negative result does not preclude SARS-Cov-2 infection and should not be used as the sole basis for treatment or other patient management decisions. A negative result may occur with  improper specimen collection/handling, submission of specimen other than nasopharyngeal swab, presence of viral mutation(s) within the areas targeted by this assay, and inadequate number of viral copies(<138 copies/mL). A negative result must be combined with clinical observations, patient history, and epidemiological information. The expected result is Negative.  Fact Sheet for Patients:  EntrepreneurPulse.com.au  Fact Sheet for Healthcare Providers:  IncredibleEmployment.be  This test is no t yet approved or cleared by the [place] FDA and  has been authorized for detection and/or diagnosis of SARS-CoV-2 by FDA under an Emergency Use Authorization (EUA). This EUA will remain  in effect (meaning this test can be used) for the duration of the COVID-19 declaration  under Section 564(b)(1) of the Act, 21 U.S.C.section 360bbb-3(b)(1), unless the authorization is terminated  or revoked sooner.       Influenza A by PCR NEGATIVE NEGATIVE Final   Influenza B by PCR NEGATIVE NEGATIVE Final    Comment: (NOTE) The Xpert Xpress SARS-CoV-2/FLU/RSV plus assay is intended as an aid in the diagnosis of influenza from Nasopharyngeal swab specimens and should not be used as a sole basis for treatment. Nasal washings and aspirates are unacceptable for Xpert Xpress SARS-CoV-2/FLU/RSV testing.  Fact Sheet for Patients: EntrepreneurPulse.com.au  Fact Sheet for Healthcare Providers: IncredibleEmployment.be  This test is not yet approved or cleared by the [place] FDA and has been authorized for detection and/or diagnosis of SARS-CoV-2 by FDA under an Emergency Use Authorization (EUA). This EUA will remain in effect (meaning this test can be used) for the duration of the COVID-19 declaration under Section 564(b)(1) of the Act, 21 U.S.C. section 360bbb-3(b)(1), unless the authorization is terminated or revoked.  Performed at [facility], [place] [address], [place], [place] [address]   Urine Culture     Status: None   Collection Time: 08/30/21  3:25 PM   Specimen: Urine, Clean Catch  Result Value Ref Range Status   Specimen Description URINE, CLEAN CATCH  Final   Special Requests NONE  Final   Culture   Final    NO GROWTH Performed at [facility], [place] [address], [place], [place] [address]    Report Status 09/01/2021 FINAL  Final  Culture, blood (routine x 2)     Status: None (Preliminary result)   Collection Time: 08/30/21  9:20 PM   Specimen: BLOOD  Result Value Ref Range Status   Specimen Description BLOOD LEFT ARM  Final   Special Requests   Final    BOTTLES DRAWN AEROBIC AND ANAEROBIC Blood Culture results may not be optimal due to an inadequate volume of blood received in culture bottles    Culture  Final    NO GROWTH 4 DAYS Performed at [facility], [place] [address], [place], [place] [address]    Report Status PENDING  Incomplete  Culture, blood (routine x 2)     Status: None (Preliminary result)   Collection Time: 08/30/21 10:03 PM   Specimen: BLOOD LEFT HAND  Result Value Ref Range Status   Specimen Description BLOOD LEFT HAND  Final   Special Requests   Final    BOTTLES DRAWN AEROBIC AND ANAEROBIC Blood Culture adequate volume   Culture   Final    NO GROWTH 3 DAYS Performed at [facility], [place] [address] [place], [place] [address]    Report Status PENDING  Incomplete     Time coordinating discharge: 25 minutes  SIGNED: Antonieta Pert, MD  [facility] 09/04/2021, 9:04 AM  If 7PM-7AM, please contact night-coverage [email or web address]

## 2021-09-05 LAB — CULTURE, BLOOD (ROUTINE X 2)
Culture: NO GROWTH
Special Requests: ADEQUATE

## 2021-11-26 ENCOUNTER — Encounter (HOSPITAL_COMMUNITY): Payer: Self-pay | Admitting: Radiology

## 2021-11-26 ENCOUNTER — Emergency Department (HOSPITAL_COMMUNITY)
Admission: EM | Admit: 2021-11-26 | Discharge: 2021-11-26 | Disposition: A | Discharge status: Home / Self Care | Payer: Medicare Other | Attending: Emergency Medicine | Admitting: Emergency Medicine

## 2021-11-26 ENCOUNTER — Emergency Department (HOSPITAL_COMMUNITY): Payer: Medicare Other

## 2021-11-26 ENCOUNTER — Other Ambulatory Visit: Payer: Self-pay

## 2021-11-26 DIAGNOSIS — F17200 Nicotine dependence, unspecified, uncomplicated: Secondary | ICD-10-CM | POA: Diagnosis not present

## 2021-11-26 DIAGNOSIS — Y900 Blood alcohol level of less than 20 mg/100 ml: Secondary | ICD-10-CM | POA: Diagnosis not present

## 2021-11-26 DIAGNOSIS — Z7901 Long term (current) use of anticoagulants: Secondary | ICD-10-CM | POA: Insufficient documentation

## 2021-11-26 DIAGNOSIS — J029 Acute pharyngitis, unspecified: Secondary | ICD-10-CM | POA: Diagnosis present

## 2021-11-26 DIAGNOSIS — F419 Anxiety disorder, unspecified: Secondary | ICD-10-CM | POA: Diagnosis not present

## 2021-11-26 DIAGNOSIS — J385 Laryngeal spasm: Secondary | ICD-10-CM | POA: Diagnosis not present

## 2021-11-26 DIAGNOSIS — K122 Cellulitis and abscess of mouth: Secondary | ICD-10-CM | POA: Diagnosis not present

## 2021-11-26 DIAGNOSIS — R0603 Acute respiratory distress: Secondary | ICD-10-CM | POA: Diagnosis present

## 2021-11-26 LAB — GROUP A STREP BY PCR: Group A Strep by PCR: NOT DETECTED

## 2021-11-26 LAB — BASIC METABOLIC PANEL
Anion gap: 10 (ref 5–15)
BUN: 11 mg/dL (ref 8–23)
CO2: 20 mmol/L — ABNORMAL LOW (ref 22–32)
Calcium: 9.3 mg/dL (ref 8.9–10.3)
Chloride: 107 mmol/L (ref 98–111)
Creatinine, Ser: 1.3 mg/dL — ABNORMAL HIGH (ref 0.61–1.24)
GFR, Estimated: >60 mL/min (ref >60)
Glucose, Bld: 125 mg/dL — ABNORMAL HIGH (ref 70–99)
Potassium: 4.3 mmol/L (ref 3.5–5.1)
Sodium: 137 mmol/L (ref 135–145)

## 2021-11-26 LAB — CBC
HCT: 46.1 % (ref 39.0–52.0)
Hemoglobin: 15.1 g/dL (ref 13.0–17.0)
MCH: 32 pg (ref 26.0–34.0)
MCHC: 32.8 g/dL (ref 30.0–36.0)
MCV: 97.7 fL (ref 80.0–100.0)
Platelets: 217 10*3/uL (ref 150–400)
RBC: 4.72 MIL/uL (ref 4.22–5.81)
RDW: 12.9 % (ref 11.5–15.5)
WBC: 4.8 10*3/uL (ref 4.0–10.5)
nRBC: 0 % (ref 0.0–0.2)

## 2021-11-26 LAB — ETHANOL: Alcohol, Ethyl (B): <10 mg/dL (ref <10)

## 2021-11-26 MED ORDER — KETOROLAC TROMETHAMINE 30 MG/ML IJ SOLN
30.0000 mg | Freq: Once | INTRAMUSCULAR | Status: AC
  Administered 2021-11-26: 30 mg via INTRAVENOUS
  Filled 2021-11-26: qty 1

## 2021-11-26 MED ORDER — IOHEXOL 300 MG/ML  SOLN
100.0000 mL | Freq: Once | INTRAMUSCULAR | Status: AC | PRN
Start: 2021-11-26 — End: 2021-11-26
  Administered 2021-11-26: 75 mL via INTRAVENOUS

## 2021-11-26 MED ORDER — METHYLPREDNISOLONE SODIUM SUCC 125 MG IJ SOLR
125.0000 mg | Freq: Once | INTRAMUSCULAR | Status: AC
Start: 2021-11-26 — End: 2021-11-26
  Administered 2021-11-26: 125 mg via INTRAVENOUS
  Filled 2021-11-26: qty 2

## 2021-11-26 MED ORDER — RACEPINEPHRINE HCL 2.25 % IN NEBU
0.5000 mL | INHALATION_SOLUTION | Freq: Once | RESPIRATORY_TRACT | Status: DC
Start: 2021-11-26 — End: 2021-11-27

## 2021-11-26 MED ORDER — SODIUM CHLORIDE 0.9 % IV SOLN: INTRAVENOUS | Status: DC

## 2021-11-26 MED ORDER — PREDNISONE 20 MG PO TABS: ORAL_TABLET | ORAL | 0 refills | Status: DC

## 2021-11-26 MED ORDER — RACEPINEPHRINE HCL 2.25 % IN NEBU
INHALATION_SOLUTION | RESPIRATORY_TRACT | Status: AC
  Filled 2021-11-26: qty 0.5

## 2021-11-26 MED ORDER — DIPHENHYDRAMINE HCL 50 MG/ML IJ SOLN
50.0000 mg | Freq: Once | INTRAMUSCULAR | Status: AC
  Administered 2021-11-26: 50 mg via INTRAVENOUS
  Filled 2021-11-26: qty 1

## 2021-11-26 NOTE — ED Provider Notes (Signed)
?[facility] ?Provider Note ? ? ?CSN: [id number] ?Arrival date & time: 11/26/21  1426 ? ?  ? ?History ? ?Chief Complaint  ?Patient presents with  ? Respiratory Distress  ? ? ?Todd [name]. is a 66 y.o. male. ? ?HPI ?Reports he woke up with tightness and swelling in his throat.  He reports he felt like he had a hard time breathing and was coughing and spitting out phlegm.  He reports he went to bed last night about 1 AM without any symptoms.  He denies he has had anything to eat or drink yet today.  He reports he smokes Black and mild last night before he went to bed but denies any other smoking or inhalation today.  He denies being around any cleaning chemicals.  He lives in a hotel and has never had a similar experience.  Reports he has been feeling well without sore throat fever chills or neck pain.  No other associated symptoms. ? ?EMS reports patient was anxious and would not allow for nasal cannula oxygen or a racemic epi mask as treatment.  He stated it made things worse.  transport was brief no other interventions. ?  ? ?Home Medications ?Prior to Admission medications   ?Medication Sig Start Date End Date Taking? Authorizing Provider  ?acetaminophen (TYLENOL) 500 MG tablet Take 2 tablets (1,000 mg total) by mouth every 6 (six) hours as needed for moderate pain, headache or mild pain. 09/02/21   Barb Merino, MD  ?amLODipine (NORVASC) 5 MG tablet Take 1 tablet (5 mg total) by mouth daily. 09/02/21 10/03/21  Barb Merino, MD  ?apixaban (ELIQUIS) 5 MG TABS tablet Take 1 tablet (5 mg total) by mouth 2 (two) times daily. 09/02/21 10/03/21  Barb Merino, MD  ?cyclobenzaprine (FLEXERIL) 5 MG tablet Take 1 tablet (5 mg total) by mouth 3 (three) times daily as needed for muscle spasms. 09/02/21   Barb Merino, MD  ?metoprolol tartrate (LOPRESSOR) 25 MG tablet Take 1 tablet (25 mg total) by mouth 2 (two) times daily. 09/02/21   Barb Merino, MD  ?pantoprazole (PROTONIX) 40  MG tablet Take 1 tablet (40 mg total) by mouth at bedtime. 09/02/21 10/03/21  Barb Merino, MD  ?   ? ?Allergies    ?Patient has no known allergies.   ? ?Review of Systems   ?Review of Systems ?10 Systems reviewed negative except as per HPI ?Physical Exam ?Updated Vital Signs ?BP 123/90   Pulse 89   Temp 98.3 ?F (36.8 ?C) (Oral)   Resp 17   SpO2 94%  ?Physical Exam ?Constitutional:   ?   Comments: Patient is alert and frequently coughing with a harsh cough.  Voice sounds hoarse.  However, he is swallowing his own secretions and can stop coughing in order to talk and explain.  ?HENT:  ?   Head: Normocephalic and atraumatic.  ?   Mouth/Throat:  ?   Comments: Soft palate and uvula appear generally beefy and moderately inflamed.  Tip of the epiglottis is visible with wide opening but does not appear enlarged.  No significant tonsillar enlargement.  No tongue swelling.  Voice is hoarse.  Patient does not have any inspiratory stridor but has some expiratory coarseness. ?Eyes:  ?   Extraocular Movements: Extraocular movements intact.  ?Cardiovascular:  ?   Rate and Rhythm: Normal rate and regular rhythm.  ?Pulmonary:  ?   Comments: Lungs are grossly clear. ?Abdominal:  ?   General: There is no distension.  ?  Palpations: Abdomen is soft.  ?Skin: ?   General: Skin is warm and dry.  ?   Comments: No appearance of rashes  ?Neurological:  ?   General: No focal deficit present.  ?Psychiatric:  ?   Comments: Patient is very anxious  ? ? ?ED Results / Procedures / Treatments   ?Labs ?(all labs ordered are listed, but only abnormal results are displayed) ?Labs Reviewed  ?BASIC METABOLIC PANEL - Abnormal; Notable for the following components:  ?    Result Value  ? CO2 20 (*)   ? Glucose, Bld 125 (*)   ? Creatinine, Ser 1.30 (*)   ? All other components within normal limits  ?GROUP A STREP BY PCR  ?CBC  ?ETHANOL  ?RAPID URINE DRUG SCREEN, HOSP PERFORMED  ? ? ?EKG ?None ? ?Radiology ?No results found. ? ?Procedures ?Procedures   ? ?CRITICAL CARE ?Performed by: Charlesetta Shanks ? ? ?Total critical care time: 30 minutes ? ?Critical care time was exclusive of separately billable procedures and treating other patients. ? ?Critical care was necessary to treat or prevent imminent or life-threatening deterioration. ? ?Critical care was time spent personally by me on the following activities: development of treatment plan with patient and/or surrogate as well as nursing, discussions with consultants, evaluation of patient's response to treatment, examination of patient, obtaining history from patient or surrogate, ordering and performing treatments and interventions, ordering and review of laboratory studies, ordering and review of radiographic studies, pulse oximetry and re-evaluation of patient's condition.  ?Medications Ordered in ED ?Medications  ?Racepinephrine HCl 2.25 % nebulizer solution 0.5 mL (0 mLs Nebulization Hold 11/26/21 1435)  ?0.9 %  sodium chloride infusion (has no administration in time range)  ?methylPREDNISolone sodium succinate (SOLU-MEDROL) 125 mg/2 mL injection 125 mg (125 mg Intravenous Given 11/26/21 1459)  ?diphenhydrAMINE (BENADRYL) injection 50 mg (50 mg Intravenous Given 11/26/21 1500)  ?Racepinephrine HCl 2.25 % nebulizer solution (  Given 11/26/21 1435)  ?ketorolac (TORADOL) 30 MG/ML injection 30 mg (30 mg Intravenous Given 11/26/21 1503)  ? ? ?ED Course/ Medical Decision Making/ A&P ?  ?                        ?Medical Decision Making ?Amount and/or Complexity of Data Reviewed ?Labs: ordered. ?Radiology: ordered. ? ?Risk ?OTC drugs. ?Prescription drug management. ? ? ?Patient presents with sudden onset of upper airway perception of swelling and constriction.  Denies prior history of similar.  [place] diagnosis includes tonsillitis, uvulitis, epiglottitis, angioedema, allergic reaction, chemical irritant. ? ?On physical examination the airway is patent although the uvula and soft palate appear moderately inflamed.   With wide opening the tip of the epiglottis is visible but does not appear enlarged.  Patient is not have inspiratory stridor has some expiratory coarseness.  Lower suspicion for angioedema with no prior history and patient is not on ACE inhibitor.  No lip or tongue swelling visible. ? ?For possible allergic reaction will initiate Solu-Medrol and Benadryl.  Toradol for pain.  We will try a racemic epinephrine for upper airway swelling to see if any symptomatic relief is obtained.  Cetacaine spray applied as well.  Plan to proceed with basic lab work and CT soft tissue neck. ? ?15: 40 patient is much improved.  He is able to rest now.  Eyes closed in breathing without difficulty.  Vital signs stable.  We will continue with diagnostic evaluation. ? ?Pending CT result, patient may be appropriate for discharge with a  short course of prednisone. ? ? ? ? ? ? ? ?Final Clinical Impression(s) / ED Diagnoses ?Final diagnoses:  ?Uvulitis  ?Pharyngitis, unspecified etiology  ? ? ?Rx / DC Orders ?ED Discharge Orders   ? ? None  ? ?  ? ? ?  ?Charlesetta Shanks, MD ?11/27/21 1648 ? ?

## 2021-11-26 NOTE — ED Provider Notes (Signed)
Patient signed to me by Dr. Vallery Ridge pending results of CT of the neck.  CT of the neck per my review and interpretation showed no masses.  Possible vocal cord abnormality noted and will refer patient to ENT on-call.  Patient examined here and his voice is normal.  He has no shortness of breath.  States everything began when he started coughing.  Could have some laryngospasm.  Will discharge home ?  ?Lacretia Leigh, MD ?11/26/21 1847 ? ?

## 2021-11-26 NOTE — ED Notes (Signed)
Patient reports feeling a little better. Patient having less frequent coughing fits and lower work of breathing. ?

## 2021-11-26 NOTE — ED Triage Notes (Signed)
Pt BIB [facility]  with trouble breathing and swallowing since this am. O2 85% upon EMS arrival. ?

## 2022-02-15 ENCOUNTER — Emergency Department (HOSPITAL_COMMUNITY): Payer: Medicare Other

## 2022-02-15 ENCOUNTER — Other Ambulatory Visit: Payer: Self-pay

## 2022-02-15 ENCOUNTER — Emergency Department (HOSPITAL_COMMUNITY)
Admission: EM | Admit: 2022-02-15 | Discharge: 2022-02-18 | Disposition: A | Discharge status: Home / Self Care | Payer: Medicare Other | Attending: Emergency Medicine | Admitting: Emergency Medicine

## 2022-02-15 ENCOUNTER — Encounter (HOSPITAL_COMMUNITY): Payer: Self-pay | Admitting: Emergency Medicine

## 2022-02-15 DIAGNOSIS — Z7901 Long term (current) use of anticoagulants: Secondary | ICD-10-CM | POA: Insufficient documentation

## 2022-02-15 DIAGNOSIS — Z79899 Other long term (current) drug therapy: Secondary | ICD-10-CM | POA: Diagnosis not present

## 2022-02-15 DIAGNOSIS — F333 Major depressive disorder, recurrent, severe with psychotic symptoms: Secondary | ICD-10-CM

## 2022-02-15 DIAGNOSIS — F1420 Cocaine dependence, uncomplicated: Secondary | ICD-10-CM | POA: Diagnosis not present

## 2022-02-15 DIAGNOSIS — F1020 Alcohol dependence, uncomplicated: Secondary | ICD-10-CM | POA: Diagnosis present

## 2022-02-15 DIAGNOSIS — W19XXXA Unspecified fall, initial encounter: Secondary | ICD-10-CM | POA: Insufficient documentation

## 2022-02-15 DIAGNOSIS — R440 Auditory hallucinations: Secondary | ICD-10-CM | POA: Diagnosis not present

## 2022-02-15 DIAGNOSIS — F1010 Alcohol abuse, uncomplicated: Secondary | ICD-10-CM | POA: Diagnosis present

## 2022-02-15 DIAGNOSIS — R45851 Suicidal ideations: Secondary | ICD-10-CM | POA: Insufficient documentation

## 2022-02-15 DIAGNOSIS — M25562 Pain in left knee: Secondary | ICD-10-CM | POA: Insufficient documentation

## 2022-02-15 DIAGNOSIS — M25512 Pain in left shoulder: Secondary | ICD-10-CM | POA: Diagnosis not present

## 2022-02-15 DIAGNOSIS — F10288 Alcohol dependence with other alcohol-induced disorder: Secondary | ICD-10-CM | POA: Insufficient documentation

## 2022-02-15 LAB — CBC
HCT: 53.8 % — ABNORMAL HIGH (ref 39.0–52.0)
Hemoglobin: 17 g/dL (ref 13.0–17.0)
MCH: 30.7 pg (ref 26.0–34.0)
MCHC: 31.6 g/dL (ref 30.0–36.0)
MCV: 97.1 fL (ref 80.0–100.0)
Platelets: 215 10*3/uL (ref 150–400)
RBC: 5.54 MIL/uL (ref 4.22–5.81)
RDW: 13.7 % (ref 11.5–15.5)
WBC: 4.2 10*3/uL (ref 4.0–10.5)
nRBC: 0 % (ref 0.0–0.2)

## 2022-02-15 LAB — RAPID URINE DRUG SCREEN, HOSP PERFORMED
Amphetamines: NOT DETECTED
Barbiturates: NOT DETECTED
Benzodiazepines: NOT DETECTED
Cocaine: POSITIVE — AB
Opiates: NOT DETECTED
Tetrahydrocannabinol: NOT DETECTED

## 2022-02-15 LAB — I-STAT CHEM 8, ED
BUN: 25 mg/dL — ABNORMAL HIGH (ref 8–23)
Calcium, Ion: 1.29 mmol/L (ref 1.15–1.40)
Chloride: 105 mmol/L (ref 98–111)
Creatinine, Ser: 1.1 mg/dL (ref 0.61–1.24)
Glucose, Bld: 94 mg/dL (ref 70–99)
HCT: 54 % — ABNORMAL HIGH (ref 39.0–52.0)
Hemoglobin: 18.4 g/dL — ABNORMAL HIGH (ref 13.0–17.0)
Potassium: 5.1 mmol/L (ref 3.5–5.1)
Sodium: 139 mmol/L (ref 135–145)
TCO2: 25 mmol/L (ref 22–32)

## 2022-02-15 LAB — COMPREHENSIVE METABOLIC PANEL
ALT: 17 U/L (ref 0–44)
AST: 26 U/L (ref 15–41)
Albumin: 4.6 g/dL (ref 3.5–5.0)
Alkaline Phosphatase: 46 U/L (ref 38–126)
Anion gap: 7 (ref 5–15)
BUN: 24 mg/dL — ABNORMAL HIGH (ref 8–23)
CO2: 25 mmol/L (ref 22–32)
Calcium: 10.3 mg/dL (ref 8.9–10.3)
Chloride: 107 mmol/L (ref 98–111)
Creatinine, Ser: 1.21 mg/dL (ref 0.61–1.24)
GFR, Estimated: >60 mL/min (ref >60)
Glucose, Bld: 99 mg/dL (ref 70–99)
Potassium: 5.3 mmol/L — ABNORMAL HIGH (ref 3.5–5.1)
Sodium: 139 mmol/L (ref 135–145)
Total Bilirubin: 0.6 mg/dL (ref 0.3–1.2)
Total Protein: 8.8 g/dL — ABNORMAL HIGH (ref 6.5–8.1)

## 2022-02-15 LAB — SALICYLATE LEVEL: Salicylate Lvl: <7 mg/dL — ABNORMAL LOW (ref 7.0–30.0)

## 2022-02-15 LAB — ETHANOL: Alcohol, Ethyl (B): <10 mg/dL (ref <10)

## 2022-02-15 LAB — ACETAMINOPHEN LEVEL: Acetaminophen (Tylenol), Serum: <10 ug/mL — ABNORMAL LOW (ref 10–30)

## 2022-02-15 NOTE — ED Provider Notes (Signed)
[facility] DEPT Provider Note   CSN: [id number] Arrival date & time: 02/15/22  1149     History  Chief Complaint  Patient presents with  . Fall  . Knee Pain  . Shoulder Pain  . Suicidal    Todd [name]. is a 66 y.o. male with a past medical history of major depressive disorder, cocaine dependence, diabetes, hypertension, who presents to the emergency department complaining of fall onset prior to arrival.  Patient notes that he was walking in [place] with his walker.  He notes that his walker slipped from under him which caused the fall.  No meds tried prior to arrival.  Denies dizziness, lightheadedness, neck pain, back pain.   Patient also complains of auditory hallucinations and SI. During discussion with patient, patient begins crying and states "ask me the question go ahead and ask me."  Patient notes that I am he has a plan of running out in front of traffic or going to jail or getting shot in the head.  Has associated auditory hallucinations that tell him to harm himself.  Denies visual hallucinations or HI at this time.  The history is provided by the patient. No language interpreter was used.       Home Medications Prior to Admission medications   Medication Sig Start Date End Date Taking? Authorizing Provider  acetaminophen (TYLENOL) 500 MG tablet Take 2 tablets (1,000 mg total) by mouth every 6 (six) hours as needed for moderate pain, headache or mild pain. 09/02/21   Barb Merino, MD  amLODipine (NORVASC) 5 MG tablet Take 1 tablet (5 mg total) by mouth daily. 09/02/21 10/03/21  Barb Merino, MD  apixaban (ELIQUIS) 5 MG TABS tablet Take 1 tablet (5 mg total) by mouth 2 (two) times daily. 09/02/21 10/03/21  Barb Merino, MD  cyclobenzaprine (FLEXERIL) 5 MG tablet Take 1 tablet (5 mg total) by mouth 3 (three) times daily as needed for muscle spasms. 09/02/21   Barb Merino, MD  metoprolol tartrate (LOPRESSOR) 25 MG tablet Take 1 tablet  (25 mg total) by mouth 2 (two) times daily. 09/02/21   Barb Merino, MD  pantoprazole (PROTONIX) 40 MG tablet Take 1 tablet (40 mg total) by mouth at bedtime. 09/02/21 10/03/21  Barb Merino, MD  predniSONE (DELTASONE) 20 MG tablet 2 tabs po daily x 4 days 11/26/21   Charlesetta Shanks, MD      Allergies    Patient has no known allergies.    Review of Systems   Review of Systems  Physical Exam Updated Vital Signs BP (!) 155/106   Pulse 76   Temp 97.7 F (36.5 C) (Oral)   Resp 20   SpO2 98%  Physical Exam Vitals and nursing note reviewed.  Constitutional:      General: He is not in acute distress.    Appearance: He is not diaphoretic.  HENT:     Head: Normocephalic and atraumatic.     Comments: TTP noted to crown of scalp. No overlying skin chagnes.     Mouth/Throat:     Pharynx: No oropharyngeal exudate.  Eyes:     General: No scleral icterus.    Conjunctiva/sclera: Conjunctivae normal.  Cardiovascular:     Rate and Rhythm: Normal rate and regular rhythm.     Pulses: Normal pulses.     Heart sounds: Normal heart sounds.  Pulmonary:     Effort: Pulmonary effort is normal. No respiratory distress.     Breath sounds: Normal breath sounds. No  wheezing.  Abdominal:     General: Bowel sounds are normal.     Palpations: Abdomen is soft. There is no mass.     Tenderness: There is no abdominal tenderness. There is no guarding or rebound.  Musculoskeletal:        General: Normal range of motion.     Cervical back: Normal range of motion and neck supple.     Comments: Mild TTP noted to left medial aspect of left knee. No overlying skin changes. TTP noted to anterior left shoulder without overlying skin changes. No spinal TTP. Strength and sensation intact to bilateral upper and lower extremities.  Skin:    General: Skin is warm and dry.  Neurological:     Mental Status: He is alert.  Psychiatric:        Behavior: Behavior normal.     ED Results / Procedures / Treatments    Labs (all labs ordered are listed, but only abnormal results are displayed) Labs Reviewed  COMPREHENSIVE METABOLIC PANEL  ETHANOL  SALICYLATE LEVEL  ACETAMINOPHEN LEVEL  CBC  RAPID URINE DRUG SCREEN, HOSP PERFORMED    EKG None  Radiology No results found.  Procedures Procedures    Medications Ordered in ED Medications - No data to display  ED Course/ Medical Decision Making/ A&P                            Medical Decision Making Amount and/or Complexity of Data Reviewed Labs: ordered. Radiology: ordered.   Patient brought in by EMS with concerns for a fall onset prior to arrival.  Patient notes that he may have hit his head.  Denies LOC or vomiting.  Has left shoulder and left knee pain.  No meds tried prior to arrival.  Vital signs stable, patient afebrile, not tachycardic or hypoxic.  On exam patient with mild tenderness to palpation noted to left knee and left shoulder.  No overlying skin changes.  No spinal tenderness to palpation. Patient also notes suicidal ideation and auditory hallucinations at this time.  Denies HI or visual hallucinations at this time.  Differential diagnosis includes intracranial abnormality, arrhythmia, anemia, electrolyte abnormality, SI.   Co morbidities that complicate the patient evaluation: Diabetes Hypertension AKI   Labs:  I ordered, and personally interpreted labs.  The pertinent results include:   UDS notable positive for cocaine. Acetaminophen, salicylate, ethanol levels undetectable. CBC without leukocytosis. CMP with slightly elevated potassium at 5.3, slightly elevated BUN at 24, otherwise unremarkable. I-STAT Chem-8 with potassium improved to 5.1 otherwise unremarkable.  Imaging: I ordered imaging studies including CT head, CT cervical spine, bilateral shoulder and knee x-ray I independently visualized and interpreted imaging which showed: No acute findings on bilateral shoulder or knee x-ray imaging.  No acute  intracranial abnormality or abnormalities noted to cervical spine. I agree with the radiologist interpretation   Disposition: Presentation concerning for suicidal ideation.  Also concerning for fall today.  Doubt fracture, dislocation, herniation at this time.  Doubt intracranial abnormality at this time.  Doubt electrolyte abnormality, anemia, arrhythmia at this time.  Patient medically cleared at this time, TTS consult placed at this time.  Dispo pending TTS consult.     This chart was dictated using voice recognition software, Dragon. Despite the best efforts of this provider to proofread and correct errors, errors may still occur which can change documentation meaning.  Final Clinical Impression(s) / ED Diagnoses Final diagnoses:  Fall, initial encounter  Suicidal ideation    Rx / DC Orders ED Discharge Orders     None         Chesnee Floren A, PA-C 02/15/22 2233    Regan Lemming, MD 02/16/22 [phone]

## 2022-02-15 NOTE — ED Triage Notes (Signed)
Per EMS- patient reports that he tripped while at [facility].  Patient c/o left knee and left shoulder pain.  Patient refused a c-collar and refused for EMS to exam his knee and shoulder.  Patient also told EMS thta he was a burden and when he gets discharged he is going to kill himself.
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
## 2022-02-16 DIAGNOSIS — F333 Major depressive disorder, recurrent, severe with psychotic symptoms: Secondary | ICD-10-CM | POA: Diagnosis not present

## 2022-02-16 MED ORDER — HYDROXYZINE HCL 25 MG PO TABS
25.0000 mg | ORAL_TABLET | Freq: Three times a day (TID) | ORAL | Status: DC | PRN
  Administered 2022-02-16 – 2022-02-17 (×2): 25 mg via ORAL
  Filled 2022-02-16 (×2): qty 1

## 2022-02-16 MED ORDER — SERTRALINE HCL 50 MG PO TABS
25.0000 mg | ORAL_TABLET | Freq: Every day | ORAL | Status: DC
  Administered 2022-02-16 – 2022-02-18 (×3): 25 mg via ORAL
  Filled 2022-02-16 (×3): qty 1

## 2022-02-16 MED ORDER — ACETAMINOPHEN 325 MG PO TABS
650.0000 mg | ORAL_TABLET | Freq: Once | ORAL | Status: AC
  Administered 2022-02-17: 650 mg via ORAL
  Filled 2022-02-16: qty 2

## 2022-02-16 MED ORDER — ACETAMINOPHEN 325 MG PO TABS
650.0000 mg | ORAL_TABLET | Freq: Once | ORAL | Status: AC
  Administered 2022-02-16: 650 mg via ORAL
  Filled 2022-02-16: qty 2

## 2022-02-16 NOTE — Progress Notes (Signed)
Pt dressed out in scrubs  

## 2022-02-16 NOTE — ED Notes (Signed)
Walker and personal belonging bags X 2 moved to [employer]. Bags placed in locker 29.

## 2022-02-16 NOTE — Consult Note (Signed)
Patient has been referred out to area Geropsychiatry unit for admission for treatment and stabilization.  He rated depression 8/10 with 10 being severe depression.  His stressors are bilateral knee pain, inability to mobilize freely without falling due to knee pain.  Patient was tearful this morning.  We will continue to manage here while we wait for bed.  He is Sertraline 25 mg due to hx of Atrial Flutter we will not escalate dose too fast.  Plan is to increase Sertraline to 50 mg Thursday morning.  He continues to need inpatient hospitalization at this time due his endorsing suicidal ideation and hearing voices.

## 2022-02-16 NOTE — BH Assessment (Signed)
Per Larene [place], declined at [facility], due to no Geri beds.

## 2022-02-16 NOTE — BH Assessment (Signed)
[place] Assessment Progress Note   Per Charmaine Downs, NP, this pt requires psychiatric hospitalization at a facility providing specialty care for geriatric patients this time.  The following facilities have been contacted to seek placement for this pt, with results as noted:  Beds available, information sent, decision pending: [place]: [place]   If this voluntary pt is accepted to a facility, please discuss disposition with pt to be sure that he agrees to the plan.  If a facility agrees to accept pt and the plan changes in any way please call the facility to inform them of the change.  Final disposition is pending as of this writing.  Jalene Mullet, [place] Coordinator [phone]

## 2022-02-16 NOTE — ED Notes (Signed)
Pt requesting to speak to "a medical doctor" regarding knee injury yesterday and pain. MD notified.

## 2022-02-17 DIAGNOSIS — F333 Major depressive disorder, recurrent, severe with psychotic symptoms: Secondary | ICD-10-CM | POA: Diagnosis not present

## 2022-02-17 MED ORDER — LIDOCAINE 5 % EX PTCH
1.0000 | MEDICATED_PATCH | CUTANEOUS | Status: DC
Start: 2022-02-17 — End: 2022-02-18
  Administered 2022-02-17: 1 via TRANSDERMAL
  Filled 2022-02-17 (×2): qty 1

## 2022-02-17 MED ORDER — ACETAMINOPHEN 325 MG PO TABS
650.0000 mg | ORAL_TABLET | Freq: Three times a day (TID) | ORAL | Status: DC | PRN
  Administered 2022-02-17 – 2022-02-18 (×2): 650 mg via ORAL
  Filled 2022-02-17 (×2): qty 2

## 2022-02-17 NOTE — Consult Note (Signed)
Telepsych Consultation   Reason for Consult: Psych consult Referring Physician: Dr. Francia Greaves Location of Patient: Todd [name] Location of Provider: [facility]  Patient Identification: Todd [name].  MRN:  [id number] Principal Diagnosis: Major depressive disorder, recurrent episode, severe, with psychosis ([place])  Diagnosis:  Principal Problem:   Major depressive disorder, recurrent episode, severe, with psychosis ([place]) Active Problems:   Alcohol dependence ([place])   Cocaine dependence ([place])   Total Time spent with patient: 1 hour  Subjective:   Todd [name]. is a 66 y.o. male patient.  HPI:  As per [facility] intake notes: Todd [name]. is a 66 y.o. male with a past medical history of major depressive disorder, cocaine dependence, diabetes, hypertension, who presents to the emergency department complaining of fall onset prior to arrival.  Patient notes that he was walking in [place] with his walker.  He notes that his walker slipped from under him which caused the fall.  No meds tried prior to arrival.  Denies dizziness, lightheadedness, neck pain, back pain.   Patient also complains of auditory hallucinations and SI. During discussion with patient, patient begins crying and states "ask me the question go ahead and ask me."  Patient notes that I am he has a plan of running out in front of traffic or going to jail or getting shot in the head.  Has associated auditory hallucinations that tell him to harm himself.  Denies visual hallucinations or HI at this time  On assessment today via telepsych, patient presents laying on his bed.  Chart reviewed and findings shared with the treatment team and discussed with Dr. Dwyane Dee.  Alert and oriented x 4 to person, place, time, and situation.  Speech clear and fluent.  Able to maintain good eye contact with the provider throughout the encounter.  Mood labile, anxious, depressed, and dysphoric.  Affect congruent and depressed.  Thought  process coherent and linear with logical thought content.  Memory good, judgment fair and insight good.  When asked patient what brought him to the hospital, stated, "Hopelessness, no family member, knee pain, married for 1-1/2 years and she left, very angry about a lot of stuff, my thoughts are disorganized, what can I do to face the day."  Endorses suicidal ideation with a plan of running into an oncoming car, going into jail, or getting someone shot him in the head. Endorses auditory hallucination and states, "I am hearing voices saying, "you better of dead than being alive." Denies homicidal ideation, paranoia, delusions, or visual hallucinations. Endorses sleeping for 3 hours last night, and having fair appetite, added, "I cannot chew to my food because I have no teeth." Reports anxiety and rates as 8/10 with 10 being severe anxiety.  Reports depression as severe and rates as 9/10, with 10 being severe depression. Denies being safe at home due to pain and weakness to his bilateral knees and denies any access to firearms. Denies self-injurious behavior or taking any medication prior to admission. Denied being followed by a therapist or a psychiatrist.  And denies history of family member with mental illness. Endorses use of crack cocaine about 3 gm daily, alcohol drinks (4) 40 ounces daily and smoking 1 pack of cigarette per day.  Instruction provided on polysubstance and tobacco cessation due to the adverse consequences on the body system.  Patient nodded in agreement. Patient reiterated at the end crying," I am tired of living, I just want to give up.  I do not have anything to live for  and I do not have anybody to go home to."  Disposition: Patient is not psych cleared, and I recommend geropsych psychiatric placement at this time.  [name] treatment team and Elvina Sidle, ED physician made aware of patient's disposition.  Past Psychiatric History: Alcohol dependence, cocaine dependence,  major depressive disorder, recurrent episode, severe with psychosis, opioid overdose, and opioid use disorder, moderate dependence.  Risk to Self: Yes Risk to Others: No Prior Inpatient Therapy: Unknown Prior Outpatient Therapy: Unknown  Past Medical History:  Past Medical History:  Diagnosis Date  . Arthritis    "fingers" (07/14/2018)  . Depression   . DVT (deep venous thrombosis) (HCC) LLE  . Hepatitis C     finished [facility] tx ~ 2017  . Hypercholesterolemia   . Hypertension   . Prostate cancer ([place]) 33yr ago  . Pulmonary embolism and infarction ([facility] 07/13/2018  . Sleep apnea    not currently using cpap, mask causing vertigo  . Type II diabetes mellitus ([place]     Past Surgical History:  Procedure Laterality Date  . BIOPSY  01/12/2019   Procedure: BIOPSY;  Surgeon: BRonald Lobo MD;  [facility]  Service: Endoscopy;;  . BIOPSY  06/23/2019   Procedure: BIOPSY;  Surgeon: OArta Silence MD;  [facility]  Service: Endoscopy;;  . COLONOSCOPY WITH PROPOFOL N/A 02/19/2014   Procedure: COLONOSCOPY WITH PROPOFOL;  Surgeon: MGarlan Fair MD;  [facility]: [facility];  Service: Endoscopy;  Laterality: N/A;  . ENDOVENOUS ABLATION SAPHENOUS VEIN W/ LASER Left 11/22/2017   endovenous laser ablation L SSV by JTinnie GensMD   . ESOPHAGEAL BRUSHING  06/23/2019   Procedure: ESOPHAGEAL BRUSHING;  Surgeon: OArta Silence MD;  [facility]  Service: Endoscopy;;  . ESOPHAGOGASTRODUODENOSCOPY (EGD) WITH PROPOFOL N/A 01/12/2019   Procedure: ESOPHAGOGASTRODUODENOSCOPY (EGD) WITH PROPOFOL;  Surgeon: BRonald Lobo MD;  [facility]  Service: Endoscopy;  Laterality: N/A;  Patient is also scheduled for barium swallow; please notify radiology after patient's EGD is complete so that barium swallow follows the endoscopy, not vice versa  . ESOPHAGOGASTRODUODENOSCOPY (EGD) WITH PROPOFOL N/A 06/23/2019   Procedure: ESOPHAGOGASTRODUODENOSCOPY (EGD) WITH  PROPOFOL;  Surgeon: OArta Silence MD;  [facility]  Service: Endoscopy;  Laterality: N/A;  . PROSTATECTOMY  2008  . REPAIR QUADRICEPS / HAMSTRING MUSCLE Right    Family History:  Family History  Problem Relation Age of Onset  . Cancer Father        PROSTATE   Family Psychiatric  History: None indicated  Social History:  Social History   Substance and Sexual Activity  Alcohol Use Yes     Social History   Substance and Sexual Activity  Drug Use Yes    Social History   Socioeconomic History  . Marital status: Single    Spouse name: Not on file  . Number of children: 2  . Years of education: 12th  . Highest education level: Not on file  Occupational History  . Occupation: post office  Tobacco Use  . Smoking status: Every Day    Packs/day: 0.12    Years: 45.00    Total pack years: 5.40    Types: Cigarettes  . Smokeless tobacco: Never  Vaping Use  . Vaping Use: Never used  Substance and Sexual Activity  . Alcohol use: Yes  . Drug use: Yes  . Sexual activity: Not Currently  Other Topics Concern  . Not on file  Social History Narrative   ** Merged History Encounter **  Patient lives at home alone.Marland KitchenMarland KitchenDrinks coffee daily    Social Determinants of Radio broadcast assistant Strain: Not on file  Food Insecurity: Not on file  Transportation Needs: Not on file  Physical Activity: Not on file  Stress: Not on file  Social Connections: Not on file   Additional Social History:    Allergies:  No Known Allergies  Labs:  Results for orders placed or performed during the hospital encounter of 02/15/22 (from the past 48 hour(s))  I-stat chem 8, ED (not at [facility] or [facility])     Status: Abnormal   Collection Time: 02/15/22  4:29 PM  Result Value Ref Range   Sodium 139 135 - 145 mmol/L   Potassium 5.1 3.5 - 5.1 mmol/L   Chloride 105 98 - 111 mmol/L   BUN 25 (H) 8 - 23 mg/dL   Creatinine, Ser 1.10 0.61 - 1.24 mg/dL   Glucose, Bld 94 70 - 99 mg/dL     Comment: Glucose reference range applies only to samples taken after fasting for at least 8 hours.   Calcium, Ion 1.29 1.15 - 1.40 mmol/L   TCO2 25 22 - 32 mmol/L   Hemoglobin 18.4 (H) 13.0 - 17.0 g/dL   HCT 54.0 (H) 39.0 - 52.0 %    Medications:  Current Facility-Administered Medications  Medication Dose Route Frequency Provider Last Rate Last Admin  . hydrOXYzine (ATARAX) tablet 25 mg  25 mg Oral TID PRN Charmaine Downs C, NP   25 mg at 02/17/22 0724  . sertraline (ZOLOFT) tablet 25 mg  25 mg Oral Daily Charmaine Downs C, NP   25 mg at 02/17/22 3295   Current Outpatient Medications  Medication Sig Dispense Refill  . acetaminophen (TYLENOL) 500 MG tablet Take 2 tablets (1,000 mg total) by mouth every 6 (six) hours as needed for moderate pain, headache or mild pain. (Patient not taking: Reported on 02/15/2022) 30 tablet 0  . amLODipine (NORVASC) 5 MG tablet Take 1 tablet (5 mg total) by mouth daily. 30 tablet 0  . apixaban (ELIQUIS) 5 MG TABS tablet Take 1 tablet (5 mg total) by mouth 2 (two) times daily. 60 tablet 0  . cyclobenzaprine (FLEXERIL) 5 MG tablet Take 1 tablet (5 mg total) by mouth 3 (three) times daily as needed for muscle spasms. (Patient not taking: Reported on 02/15/2022) 30 tablet 0  . metoprolol tartrate (LOPRESSOR) 25 MG tablet Take 1 tablet (25 mg total) by mouth 2 (two) times daily. (Patient not taking: Reported on 02/15/2022) 60 tablet 0  . pantoprazole (PROTONIX) 40 MG tablet Take 1 tablet (40 mg total) by mouth at bedtime. 30 tablet 0  . predniSONE (DELTASONE) 20 MG tablet 2 tabs po daily x 4 days (Patient not taking: Reported on 02/15/2022) 8 tablet 0    Musculoskeletal: Strength & Muscle Tone: within normal limits Gait & Station: unsteady, unable to stand Patient leans: N/A  Psychiatric Specialty Exam:  Presentation  General Appearance: Appropriate for Environment; Casual; Fairly Groomed  Eye Contact:Good  Speech:Clear and Coherent; Normal Rate  Speech  Volume:Normal  Handedness:Right  Mood and Affect  Mood:Labile; Anxious; Depressed; Dysphoric  Affect:Congruent; Depressed  Thought Process  Thought Processes:Coherent; Linear  Descriptions of Associations:Intact  Orientation:Full (Time, Place and Person)  Thought Content:Logical; WDL  History of Schizophrenia/Schizoaffective disorder:No data recorded Duration of Psychotic Symptoms:No data recorded Hallucinations:Hallucinations: Auditory Description of Command Hallucinations: Voices saying you are not worthy to live, you are better off dead. Description of Auditory Hallucinations: Voices saying  you are not worthy to live, you are better off dead.  Ideas of Reference:None  Suicidal Thoughts:Suicidal Thoughts: Yes, Passive SI Passive Intent and/or Plan: With Intent; With Plan; With Means to [place]  Homicidal Thoughts:Homicidal Thoughts: No  Sensorium  Memory:Immediate Good; Recent Good; Remote Good  Judgment:Fair  Insight:Good  Executive Functions  Concentration:Good  Attention Span:Good  [place]  Language:Good  Psychomotor Activity  Psychomotor Activity:Psychomotor Activity: Normal  Assets  Assets:Communication Skills; Physical Health  Sleep  Sleep:Sleep: Fair Number of Hours of Sleep: 3  Physical Exam: Physical Exam Vitals and nursing note reviewed.  Constitutional:      Appearance: Normal appearance.  HENT:     Head: Normocephalic.     Right Ear: External ear normal.     Left Ear: External ear normal.     Nose: Nose normal.     Mouth/Throat:     Mouth: Mucous membranes are moist.     Pharynx: Oropharynx is clear.  Eyes:     Extraocular Movements: Extraocular movements intact.     Conjunctiva/sclera: Conjunctivae normal.     Pupils: Pupils are equal, round, and reactive to light.  Cardiovascular:     Rate and Rhythm: Normal rate.     Pulses: Normal pulses.  Pulmonary:     Effort: Pulmonary effort is normal.   Abdominal:     Palpations: Abdomen is soft.  Genitourinary:    Comments: deferred Musculoskeletal:        General: Normal range of motion.     Cervical back: Normal range of motion and neck supple.  Skin:    General: Skin is warm.  Neurological:     General: No focal deficit present.     Mental Status: He is alert and oriented to person, place, and time.  Psychiatric:        Behavior: Behavior normal.     Comments: Behavior normal but labile.   Review of Systems  Constitutional: Negative.  Negative for chills and fever.  HENT: Negative.  Negative for hearing loss and tinnitus.   Eyes: Negative.  Negative for blurred vision and double vision.  Respiratory: Negative.  Negative for cough and hemoptysis.   Cardiovascular: Negative.  Negative for chest pain and palpitations.  Gastrointestinal: Negative.  Negative for heartburn and nausea.  Genitourinary: Negative.  Negative for dysuria, frequency and urgency.  Musculoskeletal:  Positive for joint pain (Knee pain, ambulates with a walker). Negative for myalgias and neck pain.  Skin: Negative.  Negative for rash.  Neurological: Negative.  Negative for dizziness, tingling, tremors, sensory change, speech change, focal weakness, seizures, loss of consciousness, weakness and headaches.  Endo/Heme/Allergies: Negative.  Negative for environmental allergies and polydipsia. Does not bruise/bleed easily.  Psychiatric/Behavioral:  Positive for depression, hallucinations, substance abuse and suicidal ideas. The patient is nervous/anxious and has insomnia.    Blood pressure 129/86, pulse 76, temperature (!) 97.4 F (36.3 C), temperature source Oral, resp. rate 18, SpO2 96 %. There is no height or weight on file to calculate BMI.  Treatment Plan Summary: Daily contact with patient to assess and evaluate symptoms and progress in treatment and Medication management  Disposition: Recommend psychiatric Inpatient admission when medically  cleared.  This service was provided via telemedicine using a 2-way, interactive audio and video technology.  Names of all persons participating in this telemedicine service and their role in this encounter. Name: Lum Stillinger. Role: Patient  Name: Garrison Columbus, NP Role: Provider  Name: Dr. Dwyane Dee Role:  Medical Director  Name: Dr. Francia Greaves Role: Elisabeth Pigeon    Laretta Bolster, [place] 02/17/2022 12:28 PM

## 2022-02-17 NOTE — ED Notes (Signed)
Patient alert. Uncooperative  care and redirection.   Will not cooperative with door being open.  Agitated and yelling at nurse.

## 2022-02-17 NOTE — ED Notes (Signed)
Patient paranoid.   Patient becomes upset when try to ask questions or talk to patient .

## 2022-02-17 NOTE — ED Provider Notes (Signed)
Emergency Medicine Observation Re-evaluation Note  Todd [name]. is a 66 y.o. male, seen on rounds today.  Pt initially presented to the ED for complaints of Fall, Knee Pain, Shoulder Pain, and Suicidal Currently, the patient is resting comfortably.  Physical Exam  BP 129/86 (BP Location: Right Arm)   Pulse 76   Temp (!) 97.4 F (36.3 C) (Oral)   Resp 18   SpO2 96%  Physical Exam General: NAD   ED Course / MDM  EKG:   I have reviewed the labs performed to date as well as medications administered while in observation.  Recent changes in the last 24 hours include no acute events reported.  Plan  Current plan is for placement.  Todd [name]. is not under involuntary commitment.     Todd Merino, MD 02/17/22 [phone]

## 2022-02-17 NOTE — ED Notes (Signed)
Patient alert this shift. Patient labile and irritable.  Patient endorsed suicidal ideation, plans to run into traffic.   Patient endorsed hallucinations, vague description

## 2022-02-17 NOTE — ED Notes (Signed)
Pt resting denies any complaints at this time.

## 2022-02-17 NOTE — ED Notes (Signed)
Pt rested comfortably throughout the night without incident.

## 2022-02-18 DIAGNOSIS — F333 Major depressive disorder, recurrent, severe with psychotic symptoms: Secondary | ICD-10-CM | POA: Diagnosis not present

## 2022-02-18 NOTE — ED Notes (Addendum)
Pt accepted to :    Pt was accept to [facility] 02/18/22; Bed Assignment [facility]    Pt meets inpatient criteria per Charmaine Downs, NP   Attending Physician will be Angie Capone. NP    Report can be called to: [phone]   Pt can arrive after 10:00am 02/18/22

## 2022-02-18 NOTE — Progress Notes (Signed)
Addendum: Pt is under review at [facility].  Inpatient Behavioral Health Placement  Meets inpatient criteria per Charmaine Downs, NP. Referral was sent to the following facilities;   Destination Service Provider Address Phone Fax Patient Preferred  [facility]  [address] [place], [place] [address] [phone] --  [facility]  [address] [place] [address] [phone] --  [facility]  [address] [place] [address] [phone] --  [facility]  [address] [place] [address] [phone] --  [facility]  [address], [place] [address] [phone] --  [facility]  [address] [place] [address] [phone] --  [facility]  [place], [place] [address] [phone] --  [facility]  [address] [place] [address] [phone] --  [facility]  [address] [place] [address] [phone] --  [facility]  [address], [place] [address] [phone] --  [facility]  [address] [place] [address] [phone] --  [facility]  [place] [address] [phone] --  [facility]  [address] [place] [address] [phone] --  [facility]  [address] [place] [address] [phone] --  [facility]  [address], [place] [address] [phone] --  [facility]  [place], [place] [address] [phone] --    Situation  ongoing,  CSW will follow up.   Benjaman Kindler, MSW, LCSWA 02/18/2022  @ 12:36 AM;

## 2022-02-18 NOTE — ED Notes (Signed)
Patient DC d off unit to behavorial facility  Patient alert, cooperative, no s/s of distress. Patient voluntary to facility.  DC information and belongings given to [employer] for transportation.   [employer] transporting patient .

## 2022-02-18 NOTE — ED Provider Notes (Signed)
Emergency Medicine Observation Re-evaluation Note  Todd [name]. is a 66 y.o. male, seen on rounds today.  Pt initially presented to the ED for complaints of Fall, Knee Pain, Shoulder Pain, and Suicidal Currently, the patient is resting, no distress.  Physical Exam  BP 127/83 (BP Location: Left Arm)   Pulse 65   Temp 98.6 F (37 C) (Oral)   Resp 18   SpO2 95%  Physical Exam General: No distress Cardiac: Well-perfused Lungs: Clear lungs Psych: Calm  ED Course / MDM  EKG:EKG Interpretation  Date/Time:  Monday February 15 2022 16:26:47 EDT Ventricular Rate:  54 PR Interval:  178 QRS Duration: 135 QT Interval:  475 QTC Calculation: 451 R Axis:   -62 Text Interpretation: Sinus rhythm RBBB and LAFB Confirmed by Gerlene Fee [phone]) on 02/17/2022 3:02:40 PM  I have reviewed the labs performed to date as well as medications administered while in observation.  Recent changes in the last 24 hours include placement this morning to [facility]..  Plan  Current plan is for placement this morning to [facility].[name] is not under involuntary commitment.     Ezequiel Essex, MD 02/18/22 [phone]

## 2022-02-18 NOTE — BH Assessment (Signed)
[place] Assessment Progress Note   This voluntary pt has been accepted to [facility].  For details please see note authored by Shelton Silvas this morning (02/18/22 at 01:01).  This Probation officer has spoken to pt and he agrees to go voluntarily.  Pt is to be transported via [employer] [facility].  EDP Ezequiel Essex, MD and pt's nurse, Eustaquio Maize, have been notified.  Jalene Mullet, [place] Coordinator [phone]

## 2022-02-18 NOTE — Progress Notes (Signed)
Pt was accept to [facility] 02/18/22; Bed Assignment [facility]   Pt meets inpatient criteria per Charmaine Downs, NP  Attending Physician will be Angie Capone. NP   Report can be called to: [phone]  Pt can arrive after 10:00am 02/18/22  Nursing notified: Harlin Rain, RN   Nadara Mode, [place] 02/18/2022 @ 1:01 AM

## 2022-05-19 DIAGNOSIS — M25561 Pain in right knee: Secondary | ICD-10-CM | POA: Diagnosis present

## 2022-05-19 DIAGNOSIS — N50812 Left testicular pain: Secondary | ICD-10-CM | POA: Diagnosis present

## 2022-05-19 DIAGNOSIS — Z8546 Personal history of malignant neoplasm of prostate: Secondary | ICD-10-CM

## 2022-05-19 DIAGNOSIS — I7789 Other specified disorders of arteries and arterioles: Secondary | ICD-10-CM | POA: Insufficient documentation

## 2022-05-19 DIAGNOSIS — R1319 Other dysphagia: Secondary | ICD-10-CM | POA: Insufficient documentation

## 2023-04-20 ENCOUNTER — Emergency Department (HOSPITAL_COMMUNITY)
Admission: EM | Admit: 2023-04-20 | Discharge: 2023-04-20 | Disposition: A | Discharge status: Other Acute Inpatient Hospital | Payer: Medicare HMO | Attending: Emergency Medicine | Admitting: Emergency Medicine

## 2023-04-20 ENCOUNTER — Inpatient Hospital Stay
Admission: AD | Admit: 2023-04-20 | Discharge: 2023-05-02 | DRG: 885 | Disposition: A | Discharge status: Home / Self Care | Payer: Medicare HMO | Source: Intra-hospital | Attending: Psychiatry | Admitting: Psychiatry

## 2023-04-20 ENCOUNTER — Other Ambulatory Visit: Payer: Self-pay

## 2023-04-20 ENCOUNTER — Encounter: Payer: Self-pay | Admitting: Psychiatry

## 2023-04-20 DIAGNOSIS — Z20822 Contact with and (suspected) exposure to covid-19: Secondary | ICD-10-CM | POA: Diagnosis not present

## 2023-04-20 DIAGNOSIS — Z7901 Long term (current) use of anticoagulants: Secondary | ICD-10-CM | POA: Diagnosis not present

## 2023-04-20 DIAGNOSIS — Z86711 Personal history of pulmonary embolism: Secondary | ICD-10-CM | POA: Diagnosis not present

## 2023-04-20 DIAGNOSIS — F1020 Alcohol dependence, uncomplicated: Secondary | ICD-10-CM | POA: Diagnosis not present

## 2023-04-20 DIAGNOSIS — M25561 Pain in right knee: Secondary | ICD-10-CM | POA: Insufficient documentation

## 2023-04-20 DIAGNOSIS — G8929 Other chronic pain: Secondary | ICD-10-CM | POA: Insufficient documentation

## 2023-04-20 DIAGNOSIS — F3130 Bipolar disorder, current episode depressed, mild or moderate severity, unspecified: Principal | ICD-10-CM | POA: Diagnosis present

## 2023-04-20 DIAGNOSIS — Z79899 Other long term (current) drug therapy: Secondary | ICD-10-CM | POA: Diagnosis not present

## 2023-04-20 DIAGNOSIS — Z1152 Encounter for screening for COVID-19: Secondary | ICD-10-CM | POA: Diagnosis not present

## 2023-04-20 DIAGNOSIS — R45851 Suicidal ideations: Secondary | ICD-10-CM

## 2023-04-20 DIAGNOSIS — F32A Depression, unspecified: Secondary | ICD-10-CM | POA: Diagnosis present

## 2023-04-20 DIAGNOSIS — I10 Essential (primary) hypertension: Secondary | ICD-10-CM | POA: Diagnosis present

## 2023-04-20 DIAGNOSIS — Z5900 Homelessness unspecified: Secondary | ICD-10-CM | POA: Diagnosis not present

## 2023-04-20 DIAGNOSIS — F329 Major depressive disorder, single episode, unspecified: Secondary | ICD-10-CM

## 2023-04-20 DIAGNOSIS — F1010 Alcohol abuse, uncomplicated: Secondary | ICD-10-CM | POA: Diagnosis present

## 2023-04-20 DIAGNOSIS — F419 Anxiety disorder, unspecified: Secondary | ICD-10-CM | POA: Diagnosis present

## 2023-04-20 DIAGNOSIS — F17210 Nicotine dependence, cigarettes, uncomplicated: Secondary | ICD-10-CM | POA: Diagnosis present

## 2023-04-20 DIAGNOSIS — Z8546 Personal history of malignant neoplasm of prostate: Secondary | ICD-10-CM

## 2023-04-20 DIAGNOSIS — Z9079 Acquired absence of other genital organ(s): Secondary | ICD-10-CM

## 2023-04-20 DIAGNOSIS — E119 Type 2 diabetes mellitus without complications: Secondary | ICD-10-CM | POA: Diagnosis present

## 2023-04-20 DIAGNOSIS — M25562 Pain in left knee: Secondary | ICD-10-CM | POA: Insufficient documentation

## 2023-04-20 LAB — CBC WITH DIFFERENTIAL/PLATELET
Abs Immature Granulocytes: 0 10*3/uL (ref 0.00–0.07)
Basophils Absolute: 0 10*3/uL (ref 0.0–0.1)
Basophils Relative: 0 %
Eosinophils Absolute: 0.1 10*3/uL (ref 0.0–0.5)
Eosinophils Relative: 3 %
HCT: 47.9 % (ref 39.0–52.0)
Hemoglobin: 15.2 g/dL (ref 13.0–17.0)
Immature Granulocytes: 0 %
Lymphocytes Relative: 37 %
Lymphs Abs: 1.7 10*3/uL (ref 0.7–4.0)
MCH: 31.2 pg (ref 26.0–34.0)
MCHC: 31.7 g/dL (ref 30.0–36.0)
MCV: 98.4 fL (ref 80.0–100.0)
Monocytes Absolute: 0.4 10*3/uL (ref 0.1–1.0)
Monocytes Relative: 10 %
Neutro Abs: 2.3 10*3/uL (ref 1.7–7.7)
Neutrophils Relative %: 50 %
Platelets: 133 10*3/uL — ABNORMAL LOW (ref 150–400)
RBC: 4.87 MIL/uL (ref 4.22–5.81)
RDW: 14.2 % (ref 11.5–15.5)
WBC: 4.5 10*3/uL (ref 4.0–10.5)
nRBC: 0 % (ref 0.0–0.2)

## 2023-04-20 LAB — ETHANOL: Alcohol, Ethyl (B): <10 mg/dL (ref <10)

## 2023-04-20 LAB — COMPREHENSIVE METABOLIC PANEL
ALT: 23 U/L (ref 0–44)
AST: 29 U/L (ref 15–41)
Albumin: 3.6 g/dL (ref 3.5–5.0)
Alkaline Phosphatase: 70 U/L (ref 38–126)
Anion gap: 9 (ref 5–15)
BUN: 6 mg/dL — ABNORMAL LOW (ref 8–23)
CO2: 22 mmol/L (ref 22–32)
Calcium: 9 mg/dL (ref 8.9–10.3)
Chloride: 110 mmol/L (ref 98–111)
Creatinine, Ser: 1.15 mg/dL (ref 0.61–1.24)
GFR, Estimated: >60 mL/min (ref >60)
Glucose, Bld: 112 mg/dL — ABNORMAL HIGH (ref 70–99)
Potassium: 3.4 mmol/L — ABNORMAL LOW (ref 3.5–5.1)
Sodium: 141 mmol/L (ref 135–145)
Total Bilirubin: 0.7 mg/dL (ref 0.3–1.2)
Total Protein: 7.4 g/dL (ref 6.5–8.1)

## 2023-04-20 LAB — SARS CORONAVIRUS 2 BY RT PCR: SARS Coronavirus 2 by RT PCR: NEGATIVE

## 2023-04-20 MED ORDER — ALUM & MAG HYDROXIDE-SIMETH 200-200-20 MG/5ML PO SUSP: 30.0000 mL | ORAL | Status: DC | PRN

## 2023-04-20 MED ORDER — SERTRALINE HCL 50 MG PO TABS
50.0000 mg | ORAL_TABLET | Freq: Every day | ORAL | Status: DC
  Administered 2023-04-21: 50 mg via ORAL
  Filled 2023-04-20: qty 1

## 2023-04-20 MED ORDER — LORAZEPAM 2 MG/ML IJ SOLN: 2.0000 mg | Freq: Three times a day (TID) | INTRAMUSCULAR | Status: DC | PRN

## 2023-04-20 MED ORDER — SERTRALINE HCL 50 MG PO TABS
50.0000 mg | ORAL_TABLET | Freq: Every day | ORAL | Status: DC
  Administered 2023-04-20: 50 mg via ORAL
  Filled 2023-04-20: qty 1

## 2023-04-20 MED ORDER — PNEUMOCOCCAL 20-VAL CONJ VACC 0.5 ML IM SUSY
0.5000 mL | PREFILLED_SYRINGE | INTRAMUSCULAR | Status: DC
  Filled 2023-04-20: qty 0.5

## 2023-04-20 MED ORDER — ACETAMINOPHEN 500 MG PO TABS: 1000.0000 mg | ORAL_TABLET | Freq: Four times a day (QID) | ORAL | Status: DC | PRN

## 2023-04-20 MED ORDER — TRAZODONE HCL 100 MG PO TABS: 150.0000 mg | ORAL_TABLET | Freq: Every day | ORAL | Status: DC

## 2023-04-20 MED ORDER — ACETAMINOPHEN 325 MG PO TABS
650.0000 mg | ORAL_TABLET | Freq: Four times a day (QID) | ORAL | Status: DC | PRN
  Administered 2023-04-20 – 2023-04-21 (×2): 650 mg via ORAL
  Filled 2023-04-20 (×2): qty 2

## 2023-04-20 MED ORDER — INFLUENZA VAC A&B SURF ANT ADJ 0.5 ML IM SUSY
0.5000 mL | PREFILLED_SYRINGE | INTRAMUSCULAR | Status: DC
  Filled 2023-04-20: qty 0.5

## 2023-04-20 MED ORDER — AMLODIPINE BESYLATE 5 MG PO TABS
5.0000 mg | ORAL_TABLET | Freq: Every day | ORAL | Status: DC
  Administered 2023-04-21 – 2023-05-02 (×12): 5 mg via ORAL
  Filled 2023-04-20 (×12): qty 1

## 2023-04-20 MED ORDER — DIPHENHYDRAMINE HCL 50 MG/ML IJ SOLN: 50.0000 mg | Freq: Three times a day (TID) | INTRAMUSCULAR | Status: DC | PRN

## 2023-04-20 MED ORDER — HALOPERIDOL LACTATE 5 MG/ML IJ SOLN: 5.0000 mg | Freq: Three times a day (TID) | INTRAMUSCULAR | Status: DC | PRN

## 2023-04-20 MED ORDER — BUPROPION HCL ER (XL) 150 MG PO TB24
150.0000 mg | ORAL_TABLET | Freq: Every day | ORAL | Status: DC
  Administered 2023-04-21: 150 mg via ORAL
  Filled 2023-04-20: qty 1

## 2023-04-20 MED ORDER — HALOPERIDOL 5 MG PO TABS: 5.0000 mg | ORAL_TABLET | Freq: Three times a day (TID) | ORAL | Status: DC | PRN

## 2023-04-20 MED ORDER — TRAZODONE HCL 100 MG PO TABS: 100.0000 mg | ORAL_TABLET | Freq: Every day | ORAL | Status: DC

## 2023-04-20 MED ORDER — BUPROPION HCL ER (XL) 150 MG PO TB24
150.0000 mg | ORAL_TABLET | Freq: Every day | ORAL | Status: DC
  Administered 2023-04-20: 150 mg via ORAL
  Filled 2023-04-20: qty 1

## 2023-04-20 MED ORDER — TRAZODONE HCL 50 MG PO TABS
150.0000 mg | ORAL_TABLET | Freq: Every day | ORAL | Status: DC
  Administered 2023-04-20 – 2023-04-25 (×6): 150 mg via ORAL
  Filled 2023-04-20 (×6): qty 1

## 2023-04-20 MED ORDER — AMLODIPINE BESYLATE 5 MG PO TABS
5.0000 mg | ORAL_TABLET | Freq: Every day | ORAL | Status: DC
  Administered 2023-04-20: 5 mg via ORAL
  Filled 2023-04-20: qty 1

## 2023-04-20 MED ORDER — LORAZEPAM 1 MG PO TABS: 2.0000 mg | ORAL_TABLET | Freq: Three times a day (TID) | ORAL | Status: DC | PRN

## 2023-04-20 MED ORDER — DIPHENHYDRAMINE HCL 25 MG PO CAPS: 50.0000 mg | ORAL_CAPSULE | Freq: Three times a day (TID) | ORAL | Status: DC | PRN

## 2023-04-20 MED ORDER — MAGNESIUM HYDROXIDE 400 MG/5ML PO SUSP
30.0000 mL | Freq: Every day | ORAL | Status: DC | PRN
  Administered 2023-04-24: 30 mL via ORAL
  Filled 2023-04-20: qty 30

## 2023-04-20 NOTE — Consult Note (Signed)
BH ED ASSESSMENT   Reason for Consult:  SI Referring Physician:  Gerhard Munch Patient Identification: Todd [name]. MRN:  [id number] ED Chief Complaint: Suicidal ideation  Diagnosis:  Principal Problem:   Suicidal ideation Active Problems:   Alcohol dependence [facility])   ED Assessment Time Calculation: Start Time: 1300 Stop Time: 1410 Total Time in Minutes (Assessment Completion): 70   HPI:  Todd Hemsworth. is a 67 y.o. male patient presents to the [facility] emergency department complaining of bilateral knee pain and depression.  He has a history of MDD, Alcohol dependence, AMS, heroin overdose, opiate overdose, cocaine dependence, and suicidal ideation.  Subjective:   Todd Dabdoub. is a 67 y.o. male patient presents to the [facility] emergency department complaining of bilateral knee pain and depression.  He has a history of MDD, Alcohol dependence, AMS, heroin overdose, opiate overdose, cocaine dependence, and suicidal ideation.  Todd [name]., 67 y.o., male patient seen face to face by this provider, consulted with Dr. Lucianne Muss; and chart reviewed on 04/20/23.  On evaluation Todd [name]. Reports that he was previously diagnosed with "manic depressive" and needs back on medications.  He complains of feelings of depression, hopelessness and overwhelm.  He says he can't sleep unless he drinks and then he is only able to sleep two or three hours.  Patient says he lost his father and sister last year and "Dad was the rock. Now I'm the oldest and that does something to you."  Patient says he is having "racing thoughts and I'm paranoid."  He was unable to say how he is paranoid.  He states he has no appetite and went for nine days not eating food; he was only drinking during that time.  Patient states that he is drinking daily "mostly beer, but sometimes wine."  The patient said "I want my medicine back" and "I need a reboot."  Patient ambulates with a walker.  During  evaluation Todd [name]. is sitting in bed and he is intermittently tearful.  He is alert, oriented x 4, calm, cooperative and attentive.  His mood is depressed and labile with congruent affect.  He has normal speech, and behavior.  Objectively there is no evidence of psychosis/mania or delusional thinking.  Patient is able to converse coherently, goal directed thoughts, no distractibility, or pre-occupation.  He also denies homicidal ideation, psychosis, and paranoia.  He endorses suicidal ideation.  Patient answered questions appropriately.    Patient case reviewed and discussed with Dr Lucianne Muss.  Patient is a danger to himself and requires inpatient psychiatric hospitalization for stabilization and treatment.    Past Psychiatric History: MDD, Alcohol dependence, AMS, heroin overdose, opiate overdose, cocaine dependence, and suicidal ideation  Risk to Self or Others: Is the patient at risk to self? Yes Has the patient been a risk to self in the past 6 months? Yes Has the patient been a risk to self within the distant past? Yes Is the patient a risk to others? No Has the patient been a risk to others in the past 6 months? No Has the patient been a risk to others within the distant past? No  [place] Scale:  Flowsheet Row ED from 04/20/2023 in [facility] Emergency Department at [facility] ED from 02/15/2022 in [facility] Emergency Department at [facility] ED from 11/26/2021 in [facility] Emergency Department at [facility]  C-SSRS RISK CATEGORY No Risk High Risk No Risk      Substance  Abuse:   Daily alcohol use, HX of cocaine, heroin and opiate abuse  Past Medical History:  Past Medical History:  Diagnosis Date   Arthritis    "fingers" (07/14/2018)   Depression    DVT (deep venous thrombosis) (HCC) LLE   Hepatitis C     finished harvoni tx ~ 2017   Hypercholesterolemia    Hypertension    Prostate cancer (HCC) 55yrs ago   Pulmonary embolism and infarction  (HCC) 07/13/2018   Sleep apnea    not currently using cpap, mask causing vertigo   Type II diabetes mellitus (HCC)     Past Surgical History:  Procedure Laterality Date   BIOPSY  01/12/2019   Procedure: BIOPSY;  Surgeon: Bernette Redbird, MD;  Location: [facility] ENDOSCOPY;  Service: Endoscopy;;   BIOPSY  06/23/2019   Procedure: BIOPSY;  Surgeon: Willis Modena, MD;  Location: [facility] ENDOSCOPY;  Service: Endoscopy;;   COLONOSCOPY WITH PROPOFOL N/A 02/19/2014   Procedure: COLONOSCOPY WITH PROPOFOL;  Surgeon: Charolett Bumpers, MD;  Location: [facility] ENDOSCOPY;  Service: Endoscopy;  Laterality: N/A;   ENDOVENOUS ABLATION SAPHENOUS VEIN W/ LASER Left 11/22/2017   endovenous laser ablation L SSV by Josephina Gip MD    ESOPHAGEAL BRUSHING  06/23/2019   Procedure: ESOPHAGEAL BRUSHING;  Surgeon: Willis Modena, MD;  Location: [facility] ENDOSCOPY;  Service: Endoscopy;;   ESOPHAGOGASTRODUODENOSCOPY (EGD) WITH PROPOFOL N/A 01/12/2019   Procedure: ESOPHAGOGASTRODUODENOSCOPY (EGD) WITH PROPOFOL;  Surgeon: Bernette Redbird, MD;  Location: [facility] ENDOSCOPY;  Service: Endoscopy;  Laterality: N/A;  Patient is also scheduled for barium swallow; please notify radiology after patient's EGD is complete so that barium swallow follows the endoscopy, not vice versa   ESOPHAGOGASTRODUODENOSCOPY (EGD) WITH PROPOFOL N/A 06/23/2019   Procedure: ESOPHAGOGASTRODUODENOSCOPY (EGD) WITH PROPOFOL;  Surgeon: Willis Modena, MD;  Location: [facility] ENDOSCOPY;  Service: Endoscopy;  Laterality: N/A;   PROSTATECTOMY  2008   REPAIR QUADRICEPS / HAMSTRING MUSCLE Right    Family History:  Family History  Problem Relation Age of Onset   Cancer Father        PROSTATE   Family Psychiatric  History: None noted Social History:  Social History   Substance and Sexual Activity  Alcohol Use Yes     Social History   Substance and Sexual Activity  Drug Use Yes    Social History   Socioeconomic History   Marital status: Single    Spouse name: Not on file    Number of children: 2   Years of education: 12th   Highest education level: Not on file  Occupational History   Occupation: post office  Tobacco Use   Smoking status: Every Day    Current packs/day: 0.12    Average packs/day: 0.1 packs/day for 45.0 years (5.4 ttl pk-yrs)    Types: Cigarettes   Smokeless tobacco: Never  Vaping Use   Vaping status: Never Used  Substance and Sexual Activity   Alcohol use: Yes   Drug use: Yes   Sexual activity: Not Currently  Other Topics Concern   Not on file  Social History Narrative   ** Merged History Encounter **       Patient lives at home alone.Marland KitchenMarland KitchenDrinks coffee daily    Social Determinants of Health   Financial Resource Strain: Low Risk  (10/27/2022)   Received from [facility]   Overall Financial Resource Strain (CARDIA)    Difficulty of Paying Living Expenses: Not very hard  Food Insecurity: No Food Insecurity (10/27/2022)   Received from [facility]  Hunger Vital Sign    Worried About Running Out of Food in the Last Year: Never true    Ran Out of Food in the Last Year: Never true  Transportation Needs: No Transportation Needs (10/27/2022)   Received from [facility] - Transportation    Lack of Transportation (Medical): No    Lack of Transportation (Non-Medical): No  Physical Activity: Inactive (10/27/2022)   Received from [facility]   Exercise Vital Sign    Days of Exercise per Week: 0 days    Minutes of Exercise per Session: 0 min  Stress: Stress Concern Present (10/27/2022)   Received from [facility] - Occupational Stress Questionnaire    Feeling of Stress : To some extent  Social Connections: Socially Isolated (10/27/2022)   Received from [facility]   Social Connection and Isolation Panel [NHANES]    Frequency of Communication with Friends and Family: More than three times a week    Frequency of Social Gatherings with Friends and Family: More than  three times a week    Attends Religious Services: Never    Database administrator or Organizations: No    Attends Banker Meetings: Never    Marital Status: Divorced   Additional Social History: Patient has moved 3 times in the last year.      Allergies:  No Known Allergies  Labs:  Results for orders placed or performed during the hospital encounter of 04/20/23 (from the past 48 hour(s))  Comprehensive metabolic panel     Status: Abnormal   Collection Time: 04/20/23 11:38 AM  Result Value Ref Range   Sodium 141 135 - 145 mmol/L   Potassium 3.4 (L) 3.5 - 5.1 mmol/L   Chloride 110 98 - 111 mmol/L   CO2 22 22 - 32 mmol/L   Glucose, Bld 112 (H) 70 - 99 mg/dL    Comment: Glucose reference range applies only to samples taken after fasting for at least 8 hours.   BUN 6 (L) 8 - 23 mg/dL   Creatinine, Ser 1.61 0.61 - 1.24 mg/dL   Calcium 9.0 8.9 - 09.6 mg/dL   Total Protein 7.4 6.5 - 8.1 g/dL   Albumin 3.6 3.5 - 5.0 g/dL   AST 29 15 - 41 U/L   ALT 23 0 - 44 U/L   Alkaline Phosphatase 70 38 - 126 U/L   Total Bilirubin 0.7 0.3 - 1.2 mg/dL   GFR, Estimated >04 >54 mL/min    Comment: (NOTE) Calculated using the CKD-EPI Creatinine Equation (2021)    Anion gap 9 5 - 15    Comment: Performed at [facility], [address] [place], [place] [address]  Ethanol     Status: None   Collection Time: 04/20/23 11:38 AM  Result Value Ref Range   Alcohol, Ethyl (B) <10 <10 mg/dL    Comment: (NOTE) Lowest detectable limit for serum alcohol is 10 mg/dL.  For medical purposes only. Performed at [facility], [address], [place], [place] [address]   CBC with Diff     Status: Abnormal   Collection Time: 04/20/23 11:38 AM  Result Value Ref Range   WBC 4.5 4.0 - 10.5 K/uL   RBC 4.87 4.22 - 5.81 MIL/uL   Hemoglobin 15.2 13.0 - 17.0 g/dL   HCT 29.5 62.1 - 30.8 %   MCV 98.4 80.0 - 100.0 fL   MCH  31.2 26.0 - 34.0 pg   MCHC 31.7 30.0 - 36.0  g/dL   RDW 56.3 87.5 - 64.3 %   Platelets 133 (L) 150 - 400 K/uL    Comment: SPECIMEN CHECKED FOR CLOTS REPEATED TO VERIFY PLATELET COUNT CONFIRMED BY SMEAR    nRBC 0.0 0.0 - 0.2 %   Neutrophils Relative % 50 %   Neutro Abs 2.3 1.7 - 7.7 K/uL   Lymphocytes Relative 37 %   Lymphs Abs 1.7 0.7 - 4.0 K/uL   Monocytes Relative 10 %   Monocytes Absolute 0.4 0.1 - 1.0 K/uL   Eosinophils Relative 3 %   Eosinophils Absolute 0.1 0.0 - 0.5 K/uL   Basophils Relative 0 %   Basophils Absolute 0.0 0.0 - 0.1 K/uL   Immature Granulocytes 0 %   Abs Immature Granulocytes 0.00 0.00 - 0.07 K/uL    Comment: Performed at [facility], [address] [place], [place] [address]    Current Facility-Administered Medications  Medication Dose Route Frequency Provider Last Rate Last Admin   acetaminophen (TYLENOL) tablet 1,000 mg  1,000 mg Oral Q6H PRN Gerhard Munch, MD       amLODipine (NORVASC) tablet 5 mg  5 mg Oral Daily Gerhard Munch, MD   5 mg at 04/20/23 1137   Current Outpatient Medications  Medication Sig Dispense Refill   HYDROcodone-acetaminophen (NORCO) 10-325 MG tablet Take 1 tablet by mouth every 6 (six) hours as needed for severe pain (knees).     hydrOXYzine (ATARAX) 50 MG tablet Take 50 mg by mouth every 6 (six) hours as needed for anxiety.     sertraline (ZOLOFT) 100 MG tablet Take 100 mg by mouth daily.     traZODone (DESYREL) 100 MG tablet Take 100 mg by mouth at bedtime.     acetaminophen (TYLENOL) 500 MG tablet Take 2 tablets (1,000 mg total) by mouth every 6 (six) hours as needed for moderate pain, headache or mild pain. (Patient not taking: Reported on 02/15/2022) 30 tablet 0   amLODipine (NORVASC) 5 MG tablet Take 1 tablet (5 mg total) by mouth daily. (Patient not taking: Reported on 04/20/2023) 30 tablet 0    Musculoskeletal: Strength & Muscle Tone: within normal limits Gait & Station:  Uses a walker Patient leans: Front   Psychiatric Specialty  Exam: Presentation  General Appearance:  Disheveled  Eye Contact: Good  Speech: Clear and Coherent  Speech Volume: Normal  Handedness: Right   Mood and Affect  Mood: Labile; Depressed  Affect: Congruent   Thought Process  Thought Processes: Coherent  Descriptions of Associations:Intact  Orientation:Full (Time, Place and Person)  Thought Content:WDL  History of Schizophrenia/Schizoaffective disorder:No data recorded Duration of Psychotic Symptoms:No data recorded Hallucinations:Hallucinations: None  Ideas of Reference:None  Suicidal Thoughts:Suicidal Thoughts: Yes, Active SI Active Intent and/or Plan: With Intent; With Plan  Homicidal Thoughts:Homicidal Thoughts: No   Sensorium  Memory: Immediate Good; Recent Good; Remote Good  Judgment: Impaired  Insight: Poor   Executive Functions  Concentration: Fair  Attention Span: Fair  Recall: Fair  Fund of Knowledge: Fair  Language: Good   Psychomotor Activity  Psychomotor Activity: Psychomotor Activity: Normal   Assets  Assets: Communication Skills; Desire for Improvement    Sleep  Sleep: Sleep: Poor Number of Hours of Sleep: 3   Physical Exam: Physical Exam Vitals and nursing note reviewed.  Eyes:     Pupils: Pupils are equal, round, and reactive to light.  Pulmonary:     Effort: Pulmonary  effort is normal.  Skin:    General: Skin is dry.  Neurological:     Mental Status: He is alert and oriented to person, place, and time.    Review of Systems  Musculoskeletal:  Positive for joint pain.       Bilateral knee pain - chronic  Psychiatric/Behavioral:  Positive for depression, substance abuse and suicidal ideas.   All other systems reviewed and are negative.  Blood pressure (!) 146/105, pulse (!) 104, temperature 98.3 F (36.8 C), temperature source Oral, resp. rate 18, height 5\' 9"  (1.753 m), weight 87 kg, SpO2 99%. Body mass index is 28.32 kg/m.  Medical  Decision Making: Patient case reviewed and discussed with Dr Lucianne Muss.  Patient is a danger to himself and requires inpatient psychiatric hospitalization for stabilization and treatment.   Problem 1: Suicidal Ideation -Zoloft 50mg  PO Q day -Trazodone 150mg  PO Q HS -Bupropion 150mg  PO Q day  Disposition:  Recommend inpatient psychiatric hospitalization for stabilization and treatment.   Thomes Lolling, NP 04/20/2023 3:17 PM

## 2023-04-20 NOTE — ED Notes (Signed)
Patient to room 27.  Patient ambulated with a walker to room. Patient oriented to unit and room.

## 2023-04-20 NOTE — ED Notes (Signed)
Patient transported by safe transport to [facility] inpatient [facility]ero[facility] at this time.  All personal belongings transported with patient.  Patient alert and oriented.  Remains calm and cooperative.

## 2023-04-20 NOTE — ED Notes (Signed)
Pt. Was given sandwich, spite, and graham crackers

## 2023-04-20 NOTE — ED Notes (Signed)
Report given to Keyser, Charity fundraiser.  All questions answered.  Made aware of negative COVID test

## 2023-04-20 NOTE — Group Note (Unsigned)
Date:  04/21/2023 Time:  12:22 AM  Group Topic/Focus:  Making Healthy Choices:   The focus of this group is to help patients identify negative/unhealthy choices they were using prior to admission and identify positive/healthier coping strategies to replace them upon discharge.    Participation Level:  Did not attend  Participation Quality:  none  Affect:  none  Cognitive:  none  Insight: none  Engagement in Group:  None  Modes of Intervention:      Additional Comments:    Maeola Harman 04/21/2023, 12:22 AM

## 2023-04-20 NOTE — Progress Notes (Signed)
   04/20/23 2311  Psych Admission Type (Psych Patients Only)  Admission Status Voluntary  Psychosocial Assessment  Patient Complaints Depression  Eye Contact Fair  Facial Expression Animated  Affect Appropriate to circumstance  Speech Logical/coherent  Interaction Assertive  Motor Activity Slow;Unsteady  Appearance/Hygiene In scrubs  Behavior Characteristics Cooperative;Appropriate to situation  Mood Depressed;Apprehensive  Thought Process  Coherency WDL  Content WDL  Delusions WDL  Perception WDL  Hallucination None reported or observed  Judgment Impaired  Confusion WDL  Danger to Self  Current suicidal ideation? Denies  Danger to Others  Danger to Others None reported or observed

## 2023-04-20 NOTE — ED Notes (Signed)
 Pt. Was given dinner tray.

## 2023-04-20 NOTE — Progress Notes (Signed)
Patient arrived from [facility]. Alert and oriented. Patient uses a walker for chronic right knee pain and bilateral lower extremity weakness. Patient denies SI during assessment. Reports depression and would like to get back on his medication. Denies AVH. Patient has a large round scar/birthmark to left buttocks. Patient is pleasant. Patients home walker locked up and given walker from the unit. Patient received trazodone for sleep and tylenol for pain. Patient is resting quietly. No distress noted.

## 2023-04-20 NOTE — ED Notes (Signed)
[facility] unit called and made aware that patient is currently on the way

## 2023-04-20 NOTE — Progress Notes (Signed)
Pt was accepted to [facility] TODAY 04/20/2023, pending negative covid, EKG, and VS. Bed assignment: 36  Pt meets inpatient criteria per Phebe Colla, NP  Attending Physician will be Elane Fritz, DO  Report can be called to: [phone]  Pt can arrive after pending items are received  Care Team Notified: Rona Ravens, RN, Phebe Colla, NP, Caleen Essex, RN, and Lum Babe, RN  [place], [place]  04/20/2023 4:17 PM

## 2023-04-20 NOTE — ED Provider Notes (Signed)
[place] EMERGENCY DEPARTMENT AT [facility] Provider Note   CSN: [id number] Arrival date & time: 04/20/23  1032     History  Chief Complaint  Patient presents with   Knee Pain   Depression    Todd [name]. is a 67 y.o. male.  HPI Patient presents with concern of suicidal ideation, depression and knee pain. He has a long history of knee pain bilaterally, has no fall, trauma, recent injury, notes that his pain is essentially the same, not improved with OTC medication. He has a greater concern about worsening depression, suicidal ideation, and despondency. He notes he has previously had psychiatric hospitalization, recently relapsed into drinking alcohol due to his despondency.     Home Medications Prior to Admission medications   Medication Sig Start Date End Date Taking? Authorizing Provider  acetaminophen (TYLENOL) 500 MG tablet Take 2 tablets (1,000 mg total) by mouth every 6 (six) hours as needed for moderate pain, headache or mild pain. Patient not taking: Reported on 02/15/2022 09/02/21   Dorcas Carrow, MD  amLODipine (NORVASC) 5 MG tablet Take 1 tablet (5 mg total) by mouth daily. 09/02/21 10/03/21  Dorcas Carrow, MD  apixaban (ELIQUIS) 5 MG TABS tablet Take 1 tablet (5 mg total) by mouth 2 (two) times daily. 09/02/21 10/03/21  Dorcas Carrow, MD  cyclobenzaprine (FLEXERIL) 5 MG tablet Take 1 tablet (5 mg total) by mouth 3 (three) times daily as needed for muscle spasms. Patient not taking: Reported on 02/15/2022 09/02/21   Dorcas Carrow, MD  metoprolol tartrate (LOPRESSOR) 25 MG tablet Take 1 tablet (25 mg total) by mouth 2 (two) times daily. Patient not taking: Reported on 02/15/2022 09/02/21   Dorcas Carrow, MD  pantoprazole (PROTONIX) 40 MG tablet Take 1 tablet (40 mg total) by mouth at bedtime. 09/02/21 10/03/21  Dorcas Carrow, MD  predniSONE (DELTASONE) 20 MG tablet 2 tabs po daily x 4 days Patient not taking: Reported on 02/15/2022 11/26/21   Arby Barrette,  MD      Allergies    Patient has no known allergies.    Review of Systems   Review of Systems  All other systems reviewed and are negative.   Physical Exam Updated Vital Signs BP (!) 146/105 (BP Location: Left Arm)   Pulse (!) 104   Temp 98.3 F (36.8 C) (Oral)   Resp 18   Ht 5\' 9"  (1.753 m)   Wt 87 kg   SpO2 99%   BMI 28.32 kg/m  Physical Exam Vitals and nursing note reviewed.  Constitutional:      General: He is not in acute distress.    Appearance: He is well-developed.  HENT:     Head: Normocephalic and atraumatic.  Eyes:     Conjunctiva/sclera: Conjunctivae normal.  Pulmonary:     Effort: Pulmonary effort is normal. No respiratory distress.     Breath sounds: No stridor.  Abdominal:     General: There is no distension.  Musculoskeletal:     Comments: Patient moves each knee freely, spontaneously and to command, no appreciable swelling bilaterally.  Skin:    General: Skin is warm and dry.  Neurological:     Mental Status: He is alert and oriented to person, place, and time.     ED Results / Procedures / Treatments   Labs (all labs ordered are listed, but only abnormal results are displayed) Labs Reviewed  COMPREHENSIVE METABOLIC PANEL - Abnormal; Notable for the following components:      Result Value  Potassium 3.4 (*)    Glucose, Bld 112 (*)    BUN 6 (*)    All other components within normal limits  CBC WITH DIFFERENTIAL/PLATELET - Abnormal; Notable for the following components:   Platelets 133 (*)    All other components within normal limits  ETHANOL    EKG None  Radiology No results found.  Procedures Procedures    Medications Ordered in ED Medications  acetaminophen (TYLENOL) tablet 1,000 mg (has no administration in time range)  amLODipine (NORVASC) tablet 5 mg (5 mg Oral Given 04/20/23 1137)    ED Course/ Medical Decision Making/ A&P                                 Medical Decision Making Adult male with history of  depression, substance abuse, presents with worsening despondency and suicidal ideation as well as knee pain.  Knee pain unremarkable, chronic, without concern for acute new injuries.  X-rays from last year reviewed, the patient notes that he had recent studies performed that were similar, no indication for additional x-rays currently. He is distally neurovascularly intact.  Greater concern for the patient's despondency/depression and suicidal ideation given his loss of multiple family members, substance abuse. Patient had labs performed, chart reviewed and was medically cleared for behavior health evaluation.  Amount and/or Complexity of Data Reviewed External Data Reviewed: notes.    Details: Primary care walk-in clinic from 2 days ago reviewed Labs: ordered. Decision-making details documented in ED Course.  Risk OTC drugs. Prescription drug management. Decision regarding hospitalization. Diagnosis or treatment significantly limited by social determinants of health.    Final Clinical Impression(s) / ED Diagnoses Final diagnoses:  Suicidal ideation  Despondency  Chronic pain of both knees     Gerhard Munch, MD 04/20/23 1257

## 2023-04-20 NOTE — ED Triage Notes (Addendum)
Pt reports chronic knee pain x 11 years, worse today. Pt hx depression states has been out of medication x 1 month. Denies si/hi

## 2023-04-21 ENCOUNTER — Encounter: Payer: Self-pay | Admitting: Psychiatry

## 2023-04-21 DIAGNOSIS — F3130 Bipolar disorder, current episode depressed, mild or moderate severity, unspecified: Secondary | ICD-10-CM | POA: Diagnosis not present

## 2023-04-21 MED ORDER — DIVALPROEX SODIUM 250 MG PO DR TAB
250.0000 mg | DELAYED_RELEASE_TABLET | Freq: Two times a day (BID) | ORAL | Status: DC
  Administered 2023-04-21 – 2023-04-22 (×3): 250 mg via ORAL
  Filled 2023-04-21 (×3): qty 1

## 2023-04-21 MED ORDER — BUSPIRONE HCL 5 MG PO TABS
5.0000 mg | ORAL_TABLET | Freq: Two times a day (BID) | ORAL | Status: DC
  Administered 2023-04-21 – 2023-04-23 (×5): 5 mg via ORAL
  Filled 2023-04-21 (×5): qty 1

## 2023-04-21 MED ORDER — NICOTINE 14 MG/24HR TD PT24
14.0000 mg | MEDICATED_PATCH | Freq: Every day | TRANSDERMAL | Status: DC
  Administered 2023-04-22 – 2023-05-02 (×10): 14 mg via TRANSDERMAL
  Filled 2023-04-21 (×10): qty 1

## 2023-04-21 MED ORDER — HYDROCODONE-ACETAMINOPHEN 10-325 MG PO TABS
1.0000 | ORAL_TABLET | Freq: Three times a day (TID) | ORAL | Status: DC | PRN
  Administered 2023-04-21 – 2023-05-02 (×24): 1 via ORAL
  Filled 2023-04-21 (×25): qty 1

## 2023-04-21 MED ORDER — SERTRALINE HCL 25 MG PO TABS
25.0000 mg | ORAL_TABLET | Freq: Every day | ORAL | Status: DC
  Administered 2023-04-22 – 2023-04-23 (×2): 25 mg via ORAL
  Filled 2023-04-21 (×3): qty 1

## 2023-04-21 MED ORDER — HYDROXYZINE HCL 25 MG PO TABS: 25.0000 mg | ORAL_TABLET | ORAL | Status: DC | PRN

## 2023-04-21 NOTE — [facility] Suicide Risk Assessment (Signed)
[facility] Admission Suicide Risk Assessment   Nursing information obtained from:  Patient Demographic factors:  Male, Age 67 or older, Living alone Current Mental Status:  NA Loss Factors:  Decline in physical health, Loss of significant relationship Historical Factors:  NA Risk Reduction Factors:  Positive social support   Principal Problem: Suicidal ideations Diagnosis:  Principal Problem:   Suicidal ideations  Subjective Data:  Todd [name]. is a 67 y.o. male patient presents to the [facility] emergency department complaining of bilateral knee pain and depression.  He has a history of MDD, Alcohol dependence, AMS, heroin overdose, opiate overdose, cocaine dependence, and suicidal ideation.   Continued Clinical Symptoms:  Alcohol Use Disorder Identification Test Final Score (AUDIT): 2 The "Alcohol Use Disorders Identification Test", Guidelines for Use in Primary Care, Second Edition.  World Science writer [facility]). Score between 0-7:  no or low risk or alcohol related problems. Score between 8-15:  moderate risk of alcohol related problems. Score between 16-19:  high risk of alcohol related problems. Score 20 or above:  warrants further diagnostic evaluation for alcohol dependence and treatment.   CLINICAL FACTORS:   Bipolar Disorder:   Depressive phase Depression:   Anhedonia Hopelessness Insomnia Alcohol/Substance Abuse/Dependencies   Musculoskeletal: Strength & Muscle Tone: within normal limits Gait & Station:  Uses wheel chair Patient leans: N/A  Psychiatric Specialty Exam:  Presentation  General Appearance:  Appropriate for Environment  Eye Contact: Fair  Speech: Slow; Clear and Coherent  Speech Volume: Decreased  Handedness: Right   Mood and Affect  Mood: Anxious; Depressed; Hopeless  Affect: Congruent; Constricted; Restricted; Tearful   Thought Process  Thought Processes: Coherent; Goal Directed  Descriptions of  Associations:Intact  Orientation:Full (Time, Place and Person)  Thought Content:Abstract Reasoning  History of Schizophrenia/Schizoaffective disorder:No  Hallucinations:Hallucinations: None  Ideas of Reference:None  Suicidal Thoughts:Suicidal Thoughts: Yes, Active SI Active Intent and/or Plan: With Plan  Homicidal Thoughts:Homicidal Thoughts: No   Sensorium  Memory: Immediate Fair; Recent Fair  Judgment: Impaired  Insight: Shallow   Executive Functions  Concentration: Fair  Attention Span: Fair  Recall: [employer] of Knowledge: Fair  Language: Fair   Psychomotor Activity  Psychomotor Activity: Psychomotor Activity: Decreased   Assets  Assets: Communication Skills; Desire for Improvement; Housing   Sleep  Sleep: Sleep: Poor Number of Hours of Sleep: 3    Physical Exam: Physical Exam Constitutional:      Appearance: Normal appearance.  HENT:     Head: Normocephalic and atraumatic.     Nose: Nose normal.  Eyes:     Pupils: Pupils are equal, round, and reactive to light.  Cardiovascular:     Rate and Rhythm: Normal rate.  Pulmonary:     Effort: Pulmonary effort is normal.  Skin:    General: Skin is warm.  Neurological:     General: No focal deficit present.     Mental Status: He is alert and oriented to person, place, and time.    Review of Systems  Constitutional:  Positive for malaise/fatigue. Negative for fever.  HENT:  Negative for congestion and hearing loss.   Eyes:  Negative for blurred vision and double vision.  Respiratory:  Negative for cough and shortness of breath.   Cardiovascular:  Negative for chest pain and palpitations.  Gastrointestinal:  Negative for heartburn, nausea and vomiting.  Musculoskeletal:  Positive for back pain and joint pain.  Neurological:  Negative for dizziness and speech change.  Psychiatric/Behavioral:  Positive for depression, substance abuse and  suicidal ideas. The patient is nervous/anxious  and has insomnia.    Blood pressure 137/84, pulse 74, temperature 99.1 F (37.3 C), resp. rate 20, height 5\' 9"  (1.753 m), weight 92.5 kg, SpO2 98%. Body mass index is 30.13 kg/m.   COGNITIVE FEATURES THAT CONTRIBUTE TO RISK:  Thought constriction (tunnel vision)    SUICIDE RISK:   Moderate:  Frequent suicidal ideation with limited intensity, and duration, some specificity in terms of plans, no associated intent, good self-control,  PLAN OF CARE: Per H&P  I certify that inpatient services furnished can reasonably be expected to improve the patient's condition.   Lewanda Rife, MD

## 2023-04-21 NOTE — Plan of Care (Signed)
?  Problem: Education: ?Goal: Knowledge of the prescribed therapeutic regimen will improve ?Outcome: Progressing ?  ?Problem: Coping: ?Goal: Coping ability will improve ?Outcome: Progressing ?Goal: Will verbalize feelings ?Outcome: Progressing ?  ?

## 2023-04-21 NOTE — Group Note (Signed)
[facility] LCSW Group Therapy Note   Group Date: 04/21/2023 Start Time: 1315 End Time: 1400  Type of Therapy/Topic:  Group Therapy:  Feelings about Diagnosis  Participation Level:  Active   Mood: Flat    Description of Group:    This group will allow patients to explore their thoughts and feelings about diagnoses they have received. Patients will be guided to explore their level of understanding and acceptance of these diagnoses. Facilitator will encourage patients to process their thoughts and feelings about the reactions of others to their diagnosis, and will guide patients in identifying ways to discuss their diagnosis with significant others in their lives. This group will be process-oriented, with patients participating in exploration of their own experiences as well as giving and receiving support and challenge from other group members.   Therapeutic Goals: 1. Patient will demonstrate understanding of diagnosis as evidence by identifying two or more symptoms of the disorder:  2. Patient will be able to express two feelings regarding the diagnosis 3. Patient will demonstrate ability to communicate their needs through discussion and/or role plays  Summary of Patient Progress:    Pt discussed his childhood and the ways in which it shaped his feelings about the stage in life in which he is in     Therapeutic Modalities:   Cognitive Behavioral Therapy Brief Therapy Feelings Identification    Elza Rafter, LCSWA

## 2023-04-21 NOTE — H&P (Signed)
Psychiatric Admission Assessment Adult  Patient Identification: Todd [name]. MRN:  [id number] Date of Evaluation:  04/21/2023 Chief Complaint:  Suicidal ideations [R45.851] Principal Diagnosis: Bipolar I disorder, most recent episode depressed (HCC) Diagnosis:  Principal Problem:   Bipolar I disorder, most recent episode depressed (HCC) Active Problems:   Suicidal ideations  History of Present Illness: Todd [name]. is a 67 y.o. male patient presents to the [facility] emergency department complaining of bilateral knee pain and depression Today during assessment patient was tearful.  Patient reports history of bipolar disorder.  He is not able to recall the name of mood stabilizer he has been on.  Noted that patient was transferred from [facility] on 3 antidepressant.  Will discontinue Wellbutrin secondary to diagnosis of bipolar disorder.  Will continue on Zoloft low-dose and trazodone.  Patient also reports of anxiety.  Patient reports that he has been feeling helpless and hopeless, he has anhedonia, his sleep and appetite has been poor.  Patient reports that he has been getting into conflicts with his brother which is "aggravating".  Patient said that at times he feel like "running into a car". Patient also shared that he lost his father and sister.  Patient said that they passed away 2 weeks apart.  Patient reports that since he lost his father and sister his life has been "not right".  atient was provided with support and reassurance.Patient has experienced manic symptoms in the past.  He denies any manic symptoms today.  He denies auditory visual hallucination.  He denies any intention to harm himself on the unit.  He denies homicidal ideations.  Patient was encouraged to attend group and work on a safe discharge plan.  Past Psychiatric History: Patient reports history of bipolar disorder reportedly patient has history of alcohol dependence, opiate and cocaine dependence.  UDS not done  at [facility] ER.  Is the patient at risk to self? Yes.    Has the patient been a risk to self in the past 6 months? No.  Has the patient been a risk to self within the distant past? No.  Is the patient a risk to others? No.  Has the patient been a risk to others in the past 6 months? No.  Has the patient been a risk to others within the distant past? No.   [place] Scale:  Flowsheet Row Admission (Current) from 04/20/2023 in [facility] BEHAVIORAL MEDICINE Most recent reading at 04/20/2023 11:11 PM ED from 04/20/2023 in [facility] Emergency Department at [facility] Most recent reading at 04/20/2023 10:40 AM ED from 02/15/2022 in [facility] Emergency Department at [facility] Most recent reading at 02/15/2022 12:08 PM  C-SSRS RISK CATEGORY No Risk No Risk High Risk        Prior Inpatient Therapy: Yes.    Prior Outpatient Therapy: Yes.     Alcohol Screening: 1. How often do you have a drink containing alcohol?: Monthly or less 2. How many drinks containing alcohol do you have on a typical day when you are drinking?: 1 or 2 3. How often do you have six or more drinks on one occasion?: Less than monthly AUDIT-C Score: 2 4. How often during the last year have you found that you were not able to stop drinking once you had started?: Never 5. How often during the last year have you failed to do what was normally expected from you because of drinking?: Never 6. How often during the last year have  you needed a first drink in the morning to get yourself going after a heavy drinking session?: Never 7. How often during the last year have you had a feeling of guilt of remorse after drinking?: Never 8. How often during the last year have you been unable to remember what happened the night before because you had been drinking?: Never 9. Have you or someone else been injured as a result of your drinking?: No 10. Has a relative or friend or a doctor or another health worker been concerned  about your drinking or suggested you cut down?: No Alcohol Use Disorder Identification Test Final Score (AUDIT): 2 Substance Abuse History in the last 12 months:   Patient reports use of beer 40 almost couple of times per week He denies current use of illicit drug UDS not done, will order 1 today Reports he smokes half pack of cigarettes per day  Previous Psychotropic Medications: Yes  Psychological Evaluations: Yes  Past Medical History:  Past Medical History:  Diagnosis Date   Arthritis    "fingers" (07/14/2018)   Depression    DVT (deep venous thrombosis) (HCC) LLE   Hepatitis C     finished harvoni tx ~ 2017   Hypercholesterolemia    Hypertension    Prostate cancer (HCC) 36yrs ago   Pulmonary embolism and infarction (HCC) 07/13/2018   Sleep apnea    not currently using cpap, mask causing vertigo   Type II diabetes mellitus (HCC)     Past Surgical History:  Procedure Laterality Date   BIOPSY  01/12/2019   Procedure: BIOPSY;  Surgeon: Bernette Redbird, MD;  Location: [facility] ENDOSCOPY;  Service: Endoscopy;;   BIOPSY  06/23/2019   Procedure: BIOPSY;  Surgeon: Willis Modena, MD;  Location: [facility] ENDOSCOPY;  Service: Endoscopy;;   COLONOSCOPY WITH PROPOFOL N/A 02/19/2014   Procedure: COLONOSCOPY WITH PROPOFOL;  Surgeon: Charolett Bumpers, MD;  Location: [facility] ENDOSCOPY;  Service: Endoscopy;  Laterality: N/A;   ENDOVENOUS ABLATION SAPHENOUS VEIN W/ LASER Left 11/22/2017   endovenous laser ablation L SSV by Josephina Gip MD    ESOPHAGEAL BRUSHING  06/23/2019   Procedure: ESOPHAGEAL BRUSHING;  Surgeon: Willis Modena, MD;  Location: [facility] ENDOSCOPY;  Service: Endoscopy;;   ESOPHAGOGASTRODUODENOSCOPY (EGD) WITH PROPOFOL N/A 01/12/2019   Procedure: ESOPHAGOGASTRODUODENOSCOPY (EGD) WITH PROPOFOL;  Surgeon: Bernette Redbird, MD;  Location: [facility] ENDOSCOPY;  Service: Endoscopy;  Laterality: N/A;  Patient is also scheduled for barium swallow; please notify radiology after patient's EGD is complete so that barium  swallow follows the endoscopy, not vice versa   ESOPHAGOGASTRODUODENOSCOPY (EGD) WITH PROPOFOL N/A 06/23/2019   Procedure: ESOPHAGOGASTRODUODENOSCOPY (EGD) WITH PROPOFOL;  Surgeon: Willis Modena, MD;  Location: [facility] ENDOSCOPY;  Service: Endoscopy;  Laterality: N/A;   PROSTATECTOMY  2008   REPAIR QUADRICEPS / HAMSTRING MUSCLE Right    Family History:  Family History  Problem Relation Age of Onset   Cancer Father        PROSTATE    Tobacco Screening:  Social History   Tobacco Use  Smoking Status Every Day   Current packs/day: 0.12   Average packs/day: 0.1 packs/day for 45.0 years (5.4 ttl pk-yrs)   Types: Cigarettes  Smokeless Tobacco Never    BH Tobacco Counseling     Are you interested in Tobacco Cessation Medications?  No value filed. Counseled patient on smoking cessation:  No value filed. Reason Tobacco Screening Not Completed: No value filed.       Social History:  Social History   Substance and  Sexual Activity  Alcohol Use Yes     Social History   Substance and Sexual Activity  Drug Use Yes    Additional Social History:                           Allergies:  No Known Allergies Lab Results:  Results for orders placed or performed during the hospital encounter of 04/20/23 (from the past 48 hour(s))  Comprehensive metabolic panel     Status: Abnormal   Collection Time: 04/20/23 11:38 AM  Result Value Ref Range   Sodium 141 135 - 145 mmol/L   Potassium 3.4 (L) 3.5 - 5.1 mmol/L   Chloride 110 98 - 111 mmol/L   CO2 22 22 - 32 mmol/L   Glucose, Bld 112 (H) 70 - 99 mg/dL    Comment: Glucose reference range applies only to samples taken after fasting for at least 8 hours.   BUN 6 (L) 8 - 23 mg/dL   Creatinine, Ser 1.61 0.61 - 1.24 mg/dL   Calcium 9.0 8.9 - 09.6 mg/dL   Total Protein 7.4 6.5 - 8.1 g/dL   Albumin 3.6 3.5 - 5.0 g/dL   AST 29 15 - 41 U/L   ALT 23 0 - 44 U/L   Alkaline Phosphatase 70 38 - 126 U/L   Total Bilirubin 0.7 0.3 - 1.2 mg/dL    GFR, Estimated >04 >54 mL/min    Comment: (NOTE) Calculated using the CKD-EPI Creatinine Equation (2021)    Anion gap 9 5 - 15    Comment: Performed at [facility], [address], [place], [place] [address]  Ethanol     Status: None   Collection Time: 04/20/23 11:38 AM  Result Value Ref Range   Alcohol, Ethyl (B) <10 <10 mg/dL    Comment: (NOTE) Lowest detectable limit for serum alcohol is 10 mg/dL.  For medical purposes only. Performed at [facility], [address], [place], [place] [address]   CBC with Diff     Status: Abnormal   Collection Time: 04/20/23 11:38 AM  Result Value Ref Range   WBC 4.5 4.0 - 10.5 K/uL   RBC 4.87 4.22 - 5.81 MIL/uL   Hemoglobin 15.2 13.0 - 17.0 g/dL   HCT 29.5 62.1 - 30.8 %   MCV 98.4 80.0 - 100.0 fL   MCH 31.2 26.0 - 34.0 pg   MCHC 31.7 30.0 - 36.0 g/dL   RDW 65.7 84.6 - 96.2 %   Platelets 133 (L) 150 - 400 K/uL    Comment: SPECIMEN CHECKED FOR CLOTS REPEATED TO VERIFY PLATELET COUNT CONFIRMED BY SMEAR    nRBC 0.0 0.0 - 0.2 %   Neutrophils Relative % 50 %   Neutro Abs 2.3 1.7 - 7.7 K/uL   Lymphocytes Relative 37 %   Lymphs Abs 1.7 0.7 - 4.0 K/uL   Monocytes Relative 10 %   Monocytes Absolute 0.4 0.1 - 1.0 K/uL   Eosinophils Relative 3 %   Eosinophils Absolute 0.1 0.0 - 0.5 K/uL   Basophils Relative 0 %   Basophils Absolute 0.0 0.0 - 0.1 K/uL   Immature Granulocytes 0 %   Abs Immature Granulocytes 0.00 0.00 - 0.07 K/uL    Comment: Performed at [facility], [address] [place], [place] [address]  SARS Coronavirus 2 by RT PCR (hospital order, performed in [facility] hospital lab) *cepheid single result test*     Status:  None   Collection Time: 04/20/23  7:00 PM  Result Value Ref Range   SARS Coronavirus 2 by RT PCR NEGATIVE NEGATIVE    Comment: (NOTE) SARS-CoV-2 target nucleic acids are NOT DETECTED.  The SARS-CoV-2 RNA is generally detectable in upper and  lower respiratory specimens during the acute phase of infection. The lowest concentration of SARS-CoV-2 viral copies this assay can detect is 250 copies / mL. A negative result does not preclude SARS-CoV-2 infection and should not be used as the sole basis for treatment or other patient management decisions.  A negative result may occur with improper specimen collection / handling, submission of specimen other than nasopharyngeal swab, presence of viral mutation(s) within the areas targeted by this assay, and inadequate number of viral copies (<250 copies / mL). A negative result must be combined with clinical observations, patient history, and epidemiological information.  Fact Sheet for Patients:   RoadLapTop.co.za  Fact Sheet for Healthcare Providers: http://kim-miller.com/  This test is not yet approved or  cleared by the [place] FDA and has been authorized for detection and/or diagnosis of SARS-CoV-2 by FDA under an Emergency Use Authorization (EUA).  This EUA will remain in effect (meaning this test can be used) for the duration of the COVID-19 declaration under Section 564(b)(1) of the Act, 21 U.S.C. section 360bbb-3(b)(1), unless the authorization is terminated or revoked sooner.  Performed at [facility], [address], [place], [place] [address]     Blood Alcohol level:  Lab Results  Component Value Date   [facility] <10 04/20/2023   ETH <10 02/15/2022    Metabolic Disorder Labs:  Lab Results  Component Value Date   HGBA1C 5.8 (H) 09/02/2021   MPG 119.76 09/02/2021   MPG 111.15 09/03/2020   No results found for: "PROLACTIN" Lab Results  Component Value Date   CHOL  03/11/2009    128        ATP III CLASSIFICATION:  <200     mg/dL   Desirable  562-130  mg/dL   Borderline High  >=865    mg/dL   High          TRIG 59 03/11/2009   HDL 45 03/11/2009   CHOLHDL 2.8 03/11/2009   VLDL 12  03/11/2009   LDLCALC  03/11/2009    71        Total Cholesterol/HDL:CHD Risk Coronary Heart Disease Risk Table                     Men   Women  1/2 Average Risk   3.4   3.3  Average Risk       5.0   4.4  2 X Average Risk   9.6   7.1  3 X Average Risk  23.4   11.0        Use the calculated Patient Ratio above and the CHD Risk Table to determine the patient's CHD Risk.        ATP III CLASSIFICATION (LDL):  <100     mg/dL   Optimal  784-696  mg/dL   Near or Above                    Optimal  130-159  mg/dL   Borderline  295-284  mg/dL   High  >132     mg/dL   Very High    Current Medications: Current Facility-Administered Medications  Medication Dose Route Frequency Provider Last Rate Last Admin  acetaminophen (TYLENOL) tablet 650 mg  650 mg Oral Q6H PRN Weber, Kyra A, NP   650 mg at 04/21/23 1014   alum & mag hydroxide-simeth (MAALOX/MYLANTA) 200-200-20 MG/5ML suspension 30 mL  30 mL Oral Q4H PRN Weber, Kyra A, NP       amLODipine (NORVASC) tablet 5 mg  5 mg Oral Daily Weber, Kyra A, NP   5 mg at 04/21/23 1013   busPIRone (BUSPAR) tablet 5 mg  5 mg Oral BID Lewanda Rife, MD       diphenhydrAMINE (BENADRYL) capsule 50 mg  50 mg Oral TID PRN Weber, Bella Kennedy A, NP       Or   diphenhydrAMINE (BENADRYL) injection 50 mg  50 mg Intramuscular TID PRN Weber, Kyra A, NP       divalproex (DEPAKOTE) DR tablet 250 mg  250 mg Oral BID Lewanda Rife, MD       haloperidol (HALDOL) tablet 5 mg  5 mg Oral TID PRN Weber, Bella Kennedy A, NP       Or   haloperidol lactate (HALDOL) injection 5 mg  5 mg Intramuscular TID PRN Weber, Bella Kennedy A, NP       hydrOXYzine (ATARAX) tablet 25 mg  25 mg Oral Q4H PRN Lewanda Rife, MD       influenza vaccine adjuvanted (FLUAD) injection 0.5 mL  0.5 mL Intramuscular Tomorrow-1000 Herrick, Richard Edward, DO       LORazepam (ATIVAN) tablet 2 mg  2 mg Oral TID PRN Weber, Bella Kennedy A, NP       Or   LORazepam (ATIVAN) injection 2 mg  2 mg Intramuscular TID PRN Weber, Bella Kennedy  A, NP       magnesium hydroxide (MILK OF MAGNESIA) suspension 30 mL  30 mL Oral Daily PRN Weber, Kyra A, NP       pneumococcal 20-valent conjugate vaccine (PREVNAR 20) injection 0.5 mL  0.5 mL Intramuscular Tomorrow-1000 Sarina Ill, DO       sertraline (ZOLOFT) tablet 25 mg  25 mg Oral Daily Lewanda Rife, MD       traZODone (DESYREL) tablet 150 mg  150 mg Oral QHS Weber, Kyra A, NP   150 mg at 04/20/23 2215   PTA Medications: Medications Prior to Admission  Medication Sig Dispense Refill Last Dose   acetaminophen (TYLENOL) 500 MG tablet Take 2 tablets (1,000 mg total) by mouth every 6 (six) hours as needed for moderate pain, headache or mild pain. (Patient not taking: Reported on 02/15/2022) 30 tablet 0    amLODipine (NORVASC) 5 MG tablet Take 1 tablet (5 mg total) by mouth daily. (Patient not taking: Reported on 04/20/2023) 30 tablet 0    HYDROcodone-acetaminophen (NORCO) 10-325 MG tablet Take 1 tablet by mouth every 6 (six) hours as needed for severe pain (knees).      hydrOXYzine (ATARAX) 50 MG tablet Take 50 mg by mouth every 6 (six) hours as needed for anxiety.      sertraline (ZOLOFT) 100 MG tablet Take 100 mg by mouth daily.      traZODone (DESYREL) 100 MG tablet Take 100 mg by mouth at bedtime.       Musculoskeletal: Strength & Muscle Tone: within normal limits Gait & Station:  Uses wheel chair Patient leans: N/A   Psychiatric Specialty Exam:   Presentation  General Appearance:  Appropriate for Environment   Eye Contact: Fair   Speech: Slow; Clear and Coherent   Speech Volume: Decreased   Handedness: Right  Mood and Affect  Mood: Anxious; Depressed; Hopeless   Affect: Congruent; Constricted; Restricted; Tearful     Thought Process  Thought Processes: Coherent; Goal Directed   Descriptions of Associations:Intact   Orientation:Full (Time, Place and Person)   Thought Content:Abstract Reasoning   History of Schizophrenia/Schizoaffective  disorder:No   Hallucinations:Hallucinations: None   Ideas of Reference:None   Suicidal Thoughts:Suicidal Thoughts: Yes, Active SI Active Intent and/or Plan: With Plan   Homicidal Thoughts:Homicidal Thoughts: No     Sensorium  Memory: Immediate Fair; Recent Fair   Judgment: Impaired   Insight: Shallow     Executive Functions  Concentration: Fair   Attention Span: Fair   Recall: [employer] of Knowledge: Fair   Language: Fair     Psychomotor Activity  Psychomotor Activity: Psychomotor Activity: Decreased     Assets  Assets: Communication Skills; Desire for Improvement; Housing     Sleep  Sleep: Sleep: Poor Number of Hours of Sleep: 3       Physical Exam: Physical Exam Constitutional:      Appearance: Normal appearance.  HENT:     Head: Normocephalic and atraumatic.     Nose: Nose normal.  Eyes:     Pupils: Pupils are equal, round, and reactive to light.  Cardiovascular:     Rate and Rhythm: Normal rate.  Pulmonary:     Effort: Pulmonary effort is normal.  Skin:    General: Skin is warm.  Neurological:     General: No focal deficit present.     Mental Status: He is alert and oriented to person, place, and time.      Review of Systems  Constitutional:  Positive for malaise/fatigue. Negative for fever.  HENT:  Negative for congestion and hearing loss.   Eyes:  Negative for blurred vision and double vision.  Respiratory:  Negative for cough and shortness of breath.   Cardiovascular:  Negative for chest pain and palpitations.  Gastrointestinal:  Negative for heartburn, nausea and vomiting.  Musculoskeletal:  Positive for back pain and joint pain.  Neurological:  Negative for dizziness and speech change.  Psychiatric/Behavioral:  Positive for depression, substance abuse and suicidal ideas. The patient is nervous/anxious and has insomnia.    Blood pressure 137/84, pulse 74, temperature 99.1 F (37.3 C), resp. rate 20, height 5\' 9"  (1.753  m), weight 92.5 kg, SpO2 98%. Body mass index is 30.13 kg/m.  Treatment Plan Summary: Daily contact with patient to assess and evaluate symptoms and progress in treatment and Medication management  Observation Level/Precautions:  15 minute checks  Laboratory:   Psychotherapy:    Medications:  Per MAR  Consultations:    Discharge Concerns:    Estimated LOS: 5-7 days  Other:     Physician Treatment Plan for Primary Diagnosis: Bipolar I disorder, most recent episode depressed (HCC) Long Term Goal(s): Improvement in symptoms so as ready for discharge  Short Term Goals: Ability to identify changes in lifestyle to reduce recurrence of condition will improve, Ability to verbalize feelings will improve, Ability to disclose and discuss suicidal ideas, Ability to demonstrate self-control will improve, Ability to identify and develop effective coping behaviors will improve, Ability to maintain clinical measurements within normal limits will improve, Compliance with prescribed medications will improve, and Ability to identify triggers associated with substance abuse/mental health issues will improve  1.  Patient has been admitted to locked unit under safety precautions 2.  Patient has discontinued Wellbutrin to get due to diagnosis of bipolar disorder, will continue  Zoloft 50 mg by mouth daily 3.  Will start patient on Depakote 250 mg twice daily for mood stabilization and gradually increase the dose 4.  Will start BuSpar 5 mg by mouth twice daily for anxiety 5.  Patient was encouraged to attend group and work on a safe discharge plan 6.  Will get UDS 7.  Will consult social worker to get collateral and help with a safe discharge plan  I certify that inpatient services furnished can reasonably be expected to improve the patient's condition.    Lewanda Rife, MD

## 2023-04-21 NOTE — Group Note (Signed)
Date:  04/21/2023 Time:  6:32 PM  Group Topic/Focus:  Activity Group:  The focus of the group is to encourage patients to go outside and get some fresh air and some exercise as well.    Participation Level:  Did Not Attend   Todd [name] 04/21/2023, 6:32 PM

## 2023-04-21 NOTE — Progress Notes (Signed)
Patient present in the dayroom for breakfast.  Endorses passive SI, but is able to contract for safety.  Patient became tearful while speaking with this Clinical research associate. Endorses anxiety, depression and racing thoughts.  Requested medication for anxiety and knee pain (other than Tylenol).  Dr. Marval Regal made aware.  Orders placed.  Can be demanding of staff, but is re-directable.   Compliant with scheduled medications.  15 min checks in place for safety.  Participated in SW group.  Present in the milieu.  Appropriate interaction with peers.

## 2023-04-21 NOTE — Plan of Care (Signed)
  Problem: Education: Goal: Knowledge of General Education information will improve Description: Including pain rating scale, medication(s)/side effects and non-pharmacologic comfort measures Outcome: Progressing   Problem: Health Behavior/Discharge Planning: Goal: Ability to manage health-related needs will improve Outcome: Progressing   Problem: Clinical Measurements: Goal: Ability to maintain clinical measurements within normal limits will improve Outcome: Progressing Goal: Will remain free from infection Outcome: Progressing Goal: Respiratory complications will improve Outcome: Progressing   Problem: Activity: Goal: Risk for activity intolerance will decrease Outcome: Progressing   Problem: Nutrition: Goal: Adequate nutrition will be maintained Outcome: Progressing   Problem: Coping: Goal: Level of anxiety will decrease Outcome: Progressing   

## 2023-04-21 NOTE — Group Note (Signed)
Recreation Therapy Group Note   Group Topic:Emotion Expression  Group Date: 04/21/2023 Start Time: 1400 End Time: 1440 Facilitators: Rosina Lowenstein, LRT, CTRS Location: Courtyard  Group Description: Music Reminisce. LRT encouraged patients to think of their favorite song(s) that reminded them of a positive memory or time in their life. LRT encouraged patient to talk about that memory aloud to the group. LRT played the song through a speaker for all to hear. LRT and patients discussed how thinking of a positive memory or time in their life can be used as a coping skill in everyday life post discharge.    Goal Area(s) Addressed: Patient will increase verbal communication by conversing with peers. Patient will contribute to group discussion with minimal prompting. Patient will reminisce a positive memory or moment in their life.    Affect/Mood: N/A   Participation Level: Did not attend    Clinical Observations/Individualized Feedback: Todd [name] did not attend group.  Plan: Continue to engage patient in RT group sessions 2-3x/week.   Rosina Lowenstein, LRT, CTRS 04/21/2023 3:24 PM

## 2023-04-22 DIAGNOSIS — F3130 Bipolar disorder, current episode depressed, mild or moderate severity, unspecified: Secondary | ICD-10-CM | POA: Diagnosis not present

## 2023-04-22 LAB — URINE DRUG SCREEN, QUALITATIVE (ARMC ONLY)
Amphetamines, Ur Screen: NOT DETECTED
Barbiturates, Ur Screen: NOT DETECTED
Benzodiazepine, Ur Scrn: NOT DETECTED
Cannabinoid 50 Ng, Ur ~~LOC~~: NOT DETECTED
Cocaine Metabolite,Ur ~~LOC~~: NOT DETECTED
MDMA (Ecstasy)Ur Screen: NOT DETECTED
Methadone Scn, Ur: NOT DETECTED
Opiate, Ur Screen: POSITIVE — AB
Phencyclidine (PCP) Ur S: NOT DETECTED
Tricyclic, Ur Screen: NOT DETECTED

## 2023-04-22 MED ORDER — DIVALPROEX SODIUM 250 MG PO DR TAB
500.0000 mg | DELAYED_RELEASE_TABLET | Freq: Two times a day (BID) | ORAL | Status: DC
  Administered 2023-04-22 – 2023-05-02 (×20): 500 mg via ORAL
  Filled 2023-04-22 (×15): qty 2
  Filled 2023-04-22: qty 4
  Filled 2023-04-22 (×4): qty 2

## 2023-04-22 MED ORDER — IBUPROFEN 200 MG PO TABS
400.0000 mg | ORAL_TABLET | Freq: Four times a day (QID) | ORAL | Status: DC | PRN
  Administered 2023-04-22 – 2023-04-28 (×7): 400 mg via ORAL
  Filled 2023-04-22 (×7): qty 2

## 2023-04-22 NOTE — Group Note (Signed)
Date:  04/22/2023 Time:  8:49 PM  Group Topic/Focus:  Making Healthy Choices:   The focus of this group is to help patients identify negative/unhealthy choices they were using prior to admission and identify positive/healthier coping strategies to replace them upon discharge.    Participation Level:  Active  Participation Quality:  Appropriate  Affect:  Appropriate  Cognitive:  Appropriate  Insight: Appropriate  Engagement in Group:  Engaged  Modes of Intervention:  Discussion  Additional Comments:    Burt Ek 04/22/2023, 8:49 PM

## 2023-04-22 NOTE — Group Note (Signed)
Date:  04/22/2023 Time:  1:47 PM  Group Topic/Focus:  Making Healthy Choices:   The focus of this group is to help patients identify negative/unhealthy choices they were using prior to admission and identify positive/healthier coping strategies to replace them upon discharge.    Participation Level:  None  Participation Quality:    Affect:    Cognitive:    Insight:   Engagement in Group:    Modes of Intervention:    Additional Comments:    Corley Kohls 04/22/2023, 1:47 PM
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
## 2023-04-22 NOTE — BH IP Treatment Plan (Signed)
Interdisciplinary Treatment and Diagnostic Plan Update  04/22/2023 Time of Session: 9:47 AM  Todd [name]. MRN: [id number]  Principal Diagnosis: Bipolar I disorder, most recent episode depressed (HCC)  Secondary Diagnoses: Principal Problem:   Bipolar I disorder, most recent episode depressed (HCC) Active Problems:   Suicidal ideations   Current Medications:  Current Facility-Administered Medications  Medication Dose Route Frequency Provider Last Rate Last Admin   alum & mag hydroxide-simeth (MAALOX/MYLANTA) 200-200-20 MG/5ML suspension 30 mL  30 mL Oral Q4H PRN Weber, Kyra A, NP       amLODipine (NORVASC) tablet 5 mg  5 mg Oral Daily Weber, Kyra A, NP   5 mg at 04/22/23 0911   busPIRone (BUSPAR) tablet 5 mg  5 mg Oral BID Lewanda Rife, MD   5 mg at 04/22/23 4098   diphenhydrAMINE (BENADRYL) capsule 50 mg  50 mg Oral TID PRN Phebe Colla A, NP       Or   diphenhydrAMINE (BENADRYL) injection 50 mg  50 mg Intramuscular TID PRN Weber, Kyra A, NP       divalproex (DEPAKOTE) DR tablet 250 mg  250 mg Oral BID Lewanda Rife, MD   250 mg at 04/22/23 1191   haloperidol (HALDOL) tablet 5 mg  5 mg Oral TID PRN Weber, Kyra A, NP       Or   haloperidol lactate (HALDOL) injection 5 mg  5 mg Intramuscular TID PRN Weber, Bella Kennedy A, NP       HYDROcodone-acetaminophen (NORCO) 10-325 MG per tablet 1 tablet  1 tablet Oral Q8H PRN Lewanda Rife, MD   1 tablet at 04/22/23 0926   hydrOXYzine (ATARAX) tablet 25 mg  25 mg Oral Q4H PRN Lewanda Rife, MD       influenza vaccine adjuvanted (FLUAD) injection 0.5 mL  0.5 mL Intramuscular Tomorrow-1000 Herrick, Richard Edward, DO       LORazepam (ATIVAN) tablet 2 mg  2 mg Oral TID PRN Weber, Bella Kennedy A, NP       Or   LORazepam (ATIVAN) injection 2 mg  2 mg Intramuscular TID PRN Weber, Kyra A, NP       magnesium hydroxide (MILK OF MAGNESIA) suspension 30 mL  30 mL Oral Daily PRN Weber, Kyra A, NP       nicotine (NICODERM CQ - dosed in mg/24 hours)  patch 14 mg  14 mg Transdermal Daily Lewanda Rife, MD   14 mg at 04/22/23 0912   pneumococcal 20-valent conjugate vaccine (PREVNAR 20) injection 0.5 mL  0.5 mL Intramuscular Tomorrow-1000 Sarina Ill, DO       sertraline (ZOLOFT) tablet 25 mg  25 mg Oral Daily Lewanda Rife, MD   25 mg at 04/22/23 0912   traZODone (DESYREL) tablet 150 mg  150 mg Oral QHS Weber, Kyra A, NP   150 mg at 04/21/23 2132   PTA Medications: Medications Prior to Admission  Medication Sig Dispense Refill Last Dose   acetaminophen (TYLENOL) 500 MG tablet Take 2 tablets (1,000 mg total) by mouth every 6 (six) hours as needed for moderate pain, headache or mild pain. (Patient not taking: Reported on 02/15/2022) 30 tablet 0    amLODipine (NORVASC) 5 MG tablet Take 1 tablet (5 mg total) by mouth daily. (Patient not taking: Reported on 04/20/2023) 30 tablet 0    HYDROcodone-acetaminophen (NORCO) 10-325 MG tablet Take 1 tablet by mouth every 6 (six) hours as needed for severe pain (knees).      hydrOXYzine (ATARAX) 50 MG tablet  Take 50 mg by mouth every 6 (six) hours as needed for anxiety.      sertraline (ZOLOFT) 100 MG tablet Take 100 mg by mouth daily.      traZODone (DESYREL) 100 MG tablet Take 100 mg by mouth at bedtime.       Patient Stressors:    Patient Strengths:    Treatment Modalities: Medication Management, Group therapy, Case management,  1 to 1 session with clinician, Psychoeducation, Recreational therapy.   Physician Treatment Plan for Primary Diagnosis: Bipolar I disorder, most recent episode depressed (HCC) Long Term Goal(s): Improvement in symptoms so as ready for discharge   Short Term Goals: Ability to identify changes in lifestyle to reduce recurrence of condition will improve Ability to verbalize feelings will improve Ability to disclose and discuss suicidal ideas Ability to demonstrate self-control will improve Ability to identify and develop effective coping behaviors will  improve Ability to maintain clinical measurements within normal limits will improve Compliance with prescribed medications will improve Ability to identify triggers associated with substance abuse/mental health issues will improve  Medication Management: Evaluate patient's response, side effects, and tolerance of medication regimen.  Therapeutic Interventions: 1 to 1 sessions, Unit Group sessions and Medication administration.  Evaluation of Outcomes: Progressing  Physician Treatment Plan for Secondary Diagnosis: Principal Problem:   Bipolar I disorder, most recent episode depressed (HCC) Active Problems:   Suicidal ideations  Long Term Goal(s): Improvement in symptoms so as ready for discharge   Short Term Goals: Ability to identify changes in lifestyle to reduce recurrence of condition will improve Ability to verbalize feelings will improve Ability to disclose and discuss suicidal ideas Ability to demonstrate self-control will improve Ability to identify and develop effective coping behaviors will improve Ability to maintain clinical measurements within normal limits will improve Compliance with prescribed medications will improve Ability to identify triggers associated with substance abuse/mental health issues will improve     Medication Management: Evaluate patient's response, side effects, and tolerance of medication regimen.  Therapeutic Interventions: 1 to 1 sessions, Unit Group sessions and Medication administration.  Evaluation of Outcomes: Progressing   RN Treatment Plan for Primary Diagnosis: Bipolar I disorder, most recent episode depressed (HCC) Long Term Goal(s): Knowledge of disease and therapeutic regimen to maintain health will improve  Short Term Goals: Ability to remain free from injury will improve, Ability to verbalize frustration and anger appropriately will improve, Ability to demonstrate self-control, Ability to participate in decision making will improve,  Ability to verbalize feelings will improve, Ability to disclose and discuss suicidal ideas, Ability to identify and develop effective coping behaviors will improve, and Compliance with prescribed medications will improve  Medication Management: RN will administer medications as ordered by provider, will assess and evaluate patient's response and provide education to patient for prescribed medication. RN will report any adverse and/or side effects to prescribing provider.  Therapeutic Interventions: 1 on 1 counseling sessions, Psychoeducation, Medication administration, Evaluate responses to treatment, Monitor vital signs and CBGs as ordered, Perform/monitor CIWA, COWS, AIMS and Fall Risk screenings as ordered, Perform wound care treatments as ordered.  Evaluation of Outcomes: Progressing   LCSW Treatment Plan for Primary Diagnosis: Bipolar I disorder, most recent episode depressed (HCC) Long Term Goal(s): Safe transition to appropriate next level of care at discharge, Engage patient in therapeutic group addressing interpersonal concerns.  Short Term Goals: Engage patient in aftercare planning with referrals and resources, Increase social support, Increase ability to appropriately verbalize feelings, Increase emotional regulation, Facilitate acceptance of mental health  diagnosis and concerns, and Increase skills for wellness and recovery  Therapeutic Interventions: Assess for all discharge needs, 1 to 1 time with Social worker, Explore available resources and support systems, Assess for adequacy in community support network, Educate family and significant other(s) on suicide prevention, Complete Psychosocial Assessment, Interpersonal group therapy.  Evaluation of Outcomes: Progressing   Progress in Treatment: Attending groups: Yes. and No. Participating in groups: Yes. and No. Taking medication as prescribed: Yes. Toleration medication: Yes. Family/Significant other contact made: No, will  contact:  CSW will contact if given permission b Patient understands diagnosis: Yes. Discussing patient identified problems/goals with staff: Yes. Medical problems stabilized or resolved: Yes. Denies suicidal/homicidal ideation: Yes. Issues/concerns per patient self-inventory: No. Other: None   New problem(s) identified: No, Describe:  None identified   New Short Term/Long Term Goal(s): elimination of symptoms of psychosis, medication management for mood stabilization; elimination of SI thoughts; development of comprehensive mental wellness/sobriety plan.   Patient Goals:  " I want to get back on my medication... get back on my medicine, get it in my system, be productive again"   Discharge Plan or Barriers:  CSW will assist with appropriate discharge planning   Reason for Continuation of Hospitalization: Depression Medication stabilization  Estimated Length of Stay: 1 to 7 days  Last 3 [place] Suicide Severity Risk Score: Flowsheet Row Admission (Current) from 04/20/2023 in [facility] BE[facility]AL MEDICINE Most recent reading at 04/20/2023 11:11 PM ED from 04/20/2023 in [facility] Emergency Department at [facility] Most recent reading at 04/20/2023 10:40 AM ED from 02/15/2022 in [facility] Emergency Department at [facility] Most recent reading at 02/15/2022 12:08 PM  C-SSRS RISK CATEGORY No Risk No Risk High Risk       Last PHQ 2/9 Scores:     No data to display          Scribe for Treatment Team: Elza Rafter, Theresia Majors 04/22/2023 12:34 PM

## 2023-04-22 NOTE — Plan of Care (Signed)

## 2023-04-22 NOTE — Group Note (Signed)
Recreation Therapy Group Note   Group Topic:Team Building  Group Date: 04/22/2023 Start Time: 1400 End Time: 1445 Facilitators: Rosina Lowenstein, LRT, CTRS Location: Courtyard  Group Description: Apples to Apples. LRT and patients played the card game "Apples to Apples" outside in the courtyard while getting fresh air and sunlight. Light music was being played in the background. LRT facilitated post-game discussion on the importance of working well with others, communicating effectively, active listening to others and being a part of a team. LRT and pts discussed how this can apply to life post-discharge.  Goal Area(s) Addressed: Patient will communicate with peers and LRT. Patient will build on frustration tolerance skills.  Patient will practice active listening skills.   Affect/Mood: N/A   Participation Level: Did not attend    Clinical Observations/Individualized Feedback: Todd [name] did not attend group.  Plan: Continue to engage patient in RT group sessions 2-3x/week.   Rosina Lowenstein, LRT, CTRS 04/22/2023 3:06 PM

## 2023-04-22 NOTE — Plan of Care (Signed)
  Problem: Education: Goal: Knowledge of the prescribed therapeutic regimen will improve Outcome: Progressing   Problem: Coping: Goal: Coping ability will improve Outcome: Progressing   Problem: Health Behavior/Discharge Planning: Goal: Compliance with therapeutic regimen will improve Outcome: Progressing   Problem: Self-Concept: Goal: Level of anxiety will decrease Outcome: Progressing

## 2023-04-22 NOTE — Progress Notes (Signed)
Patient pleasant and cooperative.  Animated affect.  Endorses passive SI, racing thoughts and sadness. Contracts for safety on the unit. Pain rated 7/10 in both knees.  PRN pain medication given as ordered.    Compliant with scheduled medications.  15 min checks in place for safety.  Present in the milieu with appropriate interaction with peers.  Demanding with staff at times.

## 2023-04-22 NOTE — Progress Notes (Signed)
   04/22/23 1500  Spiritual Encounters  Type of Visit Initial  Care provided to: Patient  Conversation partners present during encounter Social worker/Care management/TOC  Referral source Social worker/Care management/TOC  Reason for visit Routine spiritual support  OnCall Visit No  Spiritual Framework  Presenting Themes Courage hope and growth;Impactful experiences and emotions  Community/Connection Family  Patient Stress Factors Health changes;Family relationships  Family Stress Factors None identified  Interventions  Spiritual Care Interventions Made Established relationship of care and support;Compassionate presence;Reflective listening;Encouragement;Narrative/life review  Intervention Outcomes  Outcomes Awareness around self/spiritual resourses;Connection to spiritual care;Autonomy/agency  Spiritual Care Plan  Spiritual Care Issues Still Outstanding No further spiritual care needs at this time (see row info)   Social worker asked the chaplain to speak with the patient. 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 Chaplain spoke with the patient about the importance of medication and faith. Patient also shared with the chaplain about his familial issues and his desire for having a companion. Chaplain also gave the patient a Bible at his request and will continue to follow up with him.
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
## 2023-04-22 NOTE — Progress Notes (Signed)
D- Patient alert and oriented. C/o b/l knee 10/10 pain. Denies SI, HI, AVH.  A- Scheduled medications administered to patient, per MD orders. Norco 1 tab given PRN for pain. Support and encouragement provided.  Routine safety checks conducted every 15 minutes.  Patient informed to notify staff with problems or concerns. R- No adverse drug reactions noted. Patient contracts for safety at this time. Patient compliant with medications and treatment plan. Patient receptive, calm, and cooperative. Patient interacts well with others on the unit.  Patient remains safe at this time.

## 2023-04-23 DIAGNOSIS — F3130 Bipolar disorder, current episode depressed, mild or moderate severity, unspecified: Secondary | ICD-10-CM | POA: Diagnosis not present

## 2023-04-23 MED ORDER — BUSPIRONE HCL 5 MG PO TABS
7.5000 mg | ORAL_TABLET | Freq: Two times a day (BID) | ORAL | Status: DC
  Administered 2023-04-23 – 2023-04-28 (×10): 7.5 mg via ORAL
  Filled 2023-04-23 (×10): qty 2

## 2023-04-23 MED ORDER — MELATONIN 5 MG PO TABS
5.0000 mg | ORAL_TABLET | Freq: Every day | ORAL | Status: DC
  Administered 2023-04-23 – 2023-05-01 (×9): 5 mg via ORAL
  Filled 2023-04-23 (×9): qty 1

## 2023-04-23 MED ORDER — SERTRALINE HCL 50 MG PO TABS
50.0000 mg | ORAL_TABLET | Freq: Every day | ORAL | Status: DC
  Administered 2023-04-24 – 2023-04-26 (×3): 50 mg via ORAL
  Filled 2023-04-23 (×3): qty 1

## 2023-04-23 NOTE — Progress Notes (Signed)
[facility] MD Progress Note  04/23/2023 12:53 PM Garron Stacey Drain.  MRN:  [id number]  Subjective:   Abel Zaruba. is a 67 y.o. male patient presents to the [facility] emergency department complaining of bilateral knee pain and depression. Case discussed with staff, chart reviewed, patient seen today during rounds and in treatment team meeting.  Not much change in mental status.  Patient continues to feel depressed.  Patient reports that he feels lonely.  He also reports that his sleep has been an "up-and-down".  He agrees to try melatonin in addition to trazodone.  Patient continues to report suicidal thoughts with desire to get hit by a car.  Patient is in need of constant reassurance and support.  He was encouraged to attend groups and work on coping strategies.  He denies any intention to harm himself on the unit.  He denies auditory or visual hallucinations.   Principal Problem: Bipolar I disorder, most recent episode depressed (HCC) Diagnosis: Principal Problem:   Bipolar I disorder, most recent episode depressed (HCC) Active Problems:   Suicidal ideations   Past Psychiatric History: Patient reports history of bipolar disorder reportedly patient has history of alcohol dependence, opiate and cocaine dependence.     Past Medical History:  Past Medical History:  Diagnosis Date   Arthritis    "fingers" (07/14/2018)   Depression    DVT (deep venous thrombosis) (HCC) LLE   Hepatitis C     finished harvoni tx ~ 2017   Hypercholesterolemia    Hypertension    Prostate cancer (HCC) 59yrs ago   Pulmonary embolism and infarction (HCC) 07/13/2018   Sleep apnea    not currently using cpap, mask causing vertigo   Type II diabetes mellitus (HCC)     Past Surgical History:  Procedure Laterality Date   BIOPSY  01/12/2019   Procedure: BIOPSY;  Surgeon: Bernette Redbird, MD;  Location: [facility] ENDOSCOPY;  Service: Endoscopy;;   BIOPSY  06/23/2019   Procedure: BIOPSY;  Surgeon: Willis Modena, MD;   Location[facility] ENDOSCOPY;  Service: Endoscopy;;   COLONOSCOPY WITH PROPOFOL N/A 02/19/2014   Procedure: COLONOSCOPY WITH PROPOFOL;  Surgeon: Charolett Bumpers, MD;  Location: [facility] ENDOSCOPY;  Service: Endoscopy;  Laterality: N/A;   ENDOVENOUS ABLATION SAPHENOUS VEIN W/ LASER Left 11/22/2017   endovenous laser ablation L SSV by Josephina Gip MD    ESOPHAGEAL BRUSHING  06/23/2019   Procedure: ESOPHAGEAL BRUSHING;  Surgeon: Willis Modena, MD;  Location: [facility] ENDOSCOPY;  Service: Endoscopy;;   ESOPHAGOGASTRODUODENOSCOPY (EGD) WITH PROPOFOL N/A 01/12/2019   Procedure: ESOPHAGOGASTRODUODENOSCOPY (EGD) WITH PROPOFOL;  Surgeon: Bernette Redbird, MD;  Location: [facility] ENDOSCOPY;  Service: Endoscopy;  Laterality: N/A;  Patient is also scheduled for barium swallow; please notify radiology after patient's EGD is complete so that barium swallow follows the endoscopy, not vice versa   ESOPHAGOGASTRODUODENOSCOPY (EGD) WITH PROPOFOL N/A 06/23/2019   Procedure: ESOPHAGOGASTRODUODENOSCOPY (EGD) WITH PROPOFOL;  Surgeon: Willis Modena, MD;  Location: [facility] ENDOSCOPY;  Service: Endoscopy;  Laterality: N/A;   PROSTATECTOMY  2008   REPAIR QUADRICEPS / HAMSTRING MUSCLE Right    Family History:  Family History  Problem Relation Age of Onset   Cancer Father        PROSTATE    Social History:  Social History   Substance and Sexual Activity  Alcohol Use Yes     Social History   Substance and Sexual Activity  Drug Use Yes    Social History   Socioeconomic History   Marital status: Single  Spouse name: Not on file   Number of children: 2   Years of education: 12th   Highest education level: Not on file  Occupational History   Occupation: post office  Tobacco Use   Smoking status: Every Day    Current packs/day: 0.12    Average packs/day: 0.1 packs/day for 45.0 years (5.4 ttl pk-yrs)    Types: Cigarettes   Smokeless tobacco: Never  Vaping Use   Vaping status: Never Used  Substance and Sexual Activity   Alcohol  use: Yes   Drug use: Yes   Sexual activity: Not Currently  Other Topics Concern   Not on file  Social History Narrative   ** Merged History Encounter **       Patient lives at home alone.Marland KitchenMarland KitchenDrinks coffee daily    Social Determinants of Health   Financial Resource Strain: Low Risk  (10/27/2022)   Received from [facility]   Overall Financial Resource Strain (CARDIA)    Difficulty of Paying Living Expenses: Not very hard  Food Insecurity: No Food Insecurity (04/20/2023)   Hunger Vital Sign    Worried About Running Out of Food in the Last Year: Never true    Ran Out of Food in the Last Year: Never true  Transportation Needs: No Transportation Needs (04/20/2023)   PRAPARE - Administrator, Civil Service (Medical): No    Lack of Transportation (Non-Medical): No  Physical Activity: Inactive (10/27/2022)   Received from [facility]   Exercise Vital Sign    Days of Exercise per Week: 0 days    Minutes of Exercise per Session: 0 min  Stress: Stress Concern Present (10/27/2022)   Received from [facility] of Occupational Health - Occupational Stress Questionnaire    Feeling of Stress : To some extent  Social Connections: Socially Isolated (10/27/2022)   Received from [facility]   Social Connection and Isolation Panel [NHANES]    Frequency of Communication with Friends and Family: More than three times a week    Frequency of Social Gatherings with Friends and Family: More than three times a week    Attends Religious Services: Never    Database administrator or Organizations: No    Attends Engineer, structural: Never    Marital Status: Divorced   Additional Social History:                         Sleep: Poor  Appetite:  Fair  Current Medications: Current Facility-Administered Medications  Medication Dose Route Frequency Provider Last Rate Last Admin   alum & mag hydroxide-simeth (MAALOX/MYLANTA) 200-200-20 MG/5ML  suspension 30 mL  30 mL Oral Q4H PRN Weber, Kyra A, NP       amLODipine (NORVASC) tablet 5 mg  5 mg Oral Daily Weber, Kyra A, NP   5 mg at 04/23/23 0853   busPIRone (BUSPAR) tablet 7.5 mg  7.5 mg Oral BID Lewanda Rife, MD       diphenhydrAMINE (BENADRYL) capsule 50 mg  50 mg Oral TID PRN Weber, Kyra A, NP       Or   diphenhydrAMINE (BENADRYL) injection 50 mg  50 mg Intramuscular TID PRN Weber, Kyra A, NP       divalproex (DEPAKOTE) DR tablet 500 mg  500 mg Oral BID Lewanda Rife, MD   500 mg at 04/23/23 0853   haloperidol (HALDOL) tablet 5 mg  5 mg Oral  TID PRN Phebe Colla A, NP       Or   haloperidol lactate (HALDOL) injection 5 mg  5 mg Intramuscular TID PRN Weber, Bella Kennedy A, NP       HYDROcodone-acetaminophen (NORCO) 10-325 MG per tablet 1 tablet  1 tablet Oral Q8H PRN Lewanda Rife, MD   1 tablet at 04/23/23 0853   hydrOXYzine (ATARAX) tablet 25 mg  25 mg Oral Q4H PRN Lewanda Rife, MD       ibuprofen (ADVIL) tablet 400 mg  400 mg Oral Q6H PRN Lewanda Rife, MD   400 mg at 04/22/23 1954   influenza vaccine adjuvanted (FLUAD) injection 0.5 mL  0.5 mL Intramuscular Tomorrow-1000 Sarina Ill, DO       LORazepam (ATIVAN) tablet 2 mg  2 mg Oral TID PRN Weber, Bella Kennedy A, NP       Or   LORazepam (ATIVAN) injection 2 mg  2 mg Intramuscular TID PRN Weber, Bella Kennedy A, NP       magnesium hydroxide (MILK OF MAGNESIA) suspension 30 mL  30 mL Oral Daily PRN Weber, Kyra A, NP       nicotine (NICODERM CQ - dosed in mg/24 hours) patch 14 mg  14 mg Transdermal Daily Lewanda Rife, MD   14 mg at 04/23/23 0900   pneumococcal 20-valent conjugate vaccine (PREVNAR 20) injection 0.5 mL  0.5 mL Intramuscular Tomorrow-1000 Sarina Ill, DO       [START ON 04/24/2023] sertraline (ZOLOFT) tablet 50 mg  50 mg Oral Daily Lewanda Rife, MD       traZODone (DESYREL) tablet 150 mg  150 mg Oral QHS Weber, Kyra A, NP   150 mg at 04/22/23 2111    Lab Results:  No results found for  this or any previous visit (from the past 48 hour(s)).   Blood Alcohol level:  Lab Results  Component Value Date   ETH <10 04/20/2023   ETH <10 02/15/2022    Metabolic Disorder Labs: Lab Results  Component Value Date   HGBA1C 5.8 (H) 09/02/2021   MPG 119.76 09/02/2021   MPG 111.15 09/03/2020   No results found for: "PROLACTIN" Lab Results  Component Value Date   CHOL  03/11/2009    128        ATP III CLASSIFICATION:  <200     mg/dL   Desirable  098-119  mg/dL   Borderline High  >=147    mg/dL   High          TRIG 59 03/11/2009   HDL 45 03/11/2009   CHOLHDL 2.8 03/11/2009   VLDL 12 03/11/2009   LDLCALC  03/11/2009    71        Total Cholesterol/HDL:CHD Risk Coronary Heart Disease Risk Table                     Men   Women  1/2 Average Risk   3.4   3.3  Average Risk       5.0   4.4  2 X Average Risk   9.6   7.1  3 X Average Risk  23.4   11.0        Use the calculated Patient Ratio above and the CHD Risk Table to determine the patient's CHD Risk.        ATP III CLASSIFICATION (LDL):  <100     mg/dL   Optimal  829-562  mg/dL   Near or Above  Optimal  130-159  mg/dL   Borderline  161-096  mg/dL   High  >045     mg/dL   Very High    Physical Findings: AIMS:  , ,  ,  ,    CIWA:    COWS:     Musculoskeletal: Strength & Muscle Tone: within normal limits Gait & Station:  Uses wheel chair Patient leans: N/A   Psychiatric Specialty Exam:   Presentation  General Appearance:  Appropriate for Environment   Eye Contact: Fair   Speech: Slow; Clear and Coherent   Speech Volume: Decreased   Handedness: Right     Mood and Affect  Mood: "Depressed"   Affect: Congruent; Constricted; Restricted; Tearful     Thought Process  Thought Processes: Coherent; Goal Directed   Descriptions of Associations:Intact   Orientation:Full (Time, Place and Person)   Thought Content:Abstract Reasoning   History of  Schizophrenia/Schizoaffective disorder:No   Hallucinations:Hallucinations: None   Ideas of Reference:None   Suicidal Thoughts:Suicidal Thoughts: Yes, Active SI Active Intent and/or Plan: With Plan   Homicidal Thoughts:Homicidal Thoughts: No     Sensorium  Memory: Immediate Fair; Recent Fair   Judgment: Impaired   Insight: Shallow     Executive Functions  Concentration: Fair   Attention Span: Fair   Recall: [employer] of Knowledge: Fair   Language: Fair     Psychomotor Activity  Psychomotor Activity: Psychomotor Activity: Decreased     Assets  Assets: Communication Skills; Desire for Improvement; Housing     Sleep  Sleep: Sleep: Poor Number of Hours of Sleep: 3       Physical Exam: Physical Exam Constitutional:      Appearance: Normal appearance.  HENT:     Head: Normocephalic and atraumatic.     Nose: Nose normal.  Eyes:     Pupils: Pupils are equal, round, and reactive to light.  Cardiovascular:     Rate and Rhythm: Normal rate.  Pulmonary:     Effort: Pulmonary effort is normal.  Skin:    General: Skin is warm.  Neurological:     General: No focal deficit present.     Mental Status: He is alert and oriented to person, place, and time.      Review of Systems  Constitutional:  Positive for malaise/fatigue. Negative for fever.  HENT:  Negative for congestion and hearing loss.   Eyes:  Negative for blurred vision and double vision.  Respiratory:  Negative for cough and shortness of breath.   Cardiovascular:  Negative for chest pain and palpitations.  Gastrointestinal:  Negative for heartburn, nausea and vomiting.  Musculoskeletal:  Positive for back pain and joint pain.  Neurological:  Negative for dizziness and speech change.  Psychiatric/Behavioral:  Positive for depression, substance abuse and suicidal ideas. The patient is nervous/anxious and has insomnia.    Blood pressure (!) 125/92, pulse 74, temperature 98.1 F (36.7 C),  resp. rate 18, height 5\' 9"  (1.753 m), weight 92.5 kg, SpO2 97%. Body mass index is 30.13 kg/m.   Treatment Plan Summary: Daily contact with patient to assess and evaluate symptoms and progress in treatment and Medication management  1.  Patient has been admitted to locked unit under safety precautions 2.  Patient has discontinued Wellbutrin to get due to diagnosis of bipolar disorder,  Continue Zoloft 50 mg by mouth daily 3.  Increased the dose of Depakote to 500 mg twice daily for mood stabilization 04/22/2023 4.   Increase the dose of BuSpar  to 7.5 mg by mouth twice daily for anxiety 04/23/2023 5.  Patient was encouraged to attend group and work on a safe discharge plan 6.  UDS positive for opiats 7.  Will consult social worker to get collateral and help with a safe discharge plan 8.  Patient is on trazodone 150 mg as needed at bedtime to help with depression and insomnia,  will add melatonin 5 mg at bedtime for insomnia  04/23/2023  Lewanda Rife, MD

## 2023-04-23 NOTE — Group Note (Signed)
Date:  04/23/2023 Time:  8:27 PM  Group Topic/Focus:  Healthy Communication:   The focus of this group is to discuss communication, barriers to communication, as well as healthy ways to communicate with others.    Participation Level:  Active  Participation Quality:  Appropriate  Affect:  Appropriate  Cognitive:  Appropriate  Insight: Appropriate  Engagement in Group:  Engaged  Modes of Intervention:  Discussion  Additional Comments:    Burt Ek 04/23/2023, 8:27 PM

## 2023-04-23 NOTE — Plan of Care (Signed)

## 2023-04-23 NOTE — Progress Notes (Signed)
   04/23/23 0800  Psych Admission Type (Psych Patients Only)  Admission Status Voluntary  Psychosocial Assessment  Patient Complaints Anxiety;Depression  Eye Contact Fair  Facial Expression Animated  Affect Appropriate to circumstance  Speech Logical/coherent  Interaction Assertive  Motor Activity Slow  Appearance/Hygiene In scrubs  Behavior Characteristics Calm  Mood Pleasant  Thought Process  Coherency WDL  Content WDL  Delusions None reported or observed  Perception WDL  Hallucination None reported or observed  Judgment Impaired  Confusion None  Danger to Self  Current suicidal ideation? Denies

## 2023-04-23 NOTE — Progress Notes (Signed)
D- Patient alert and oriented. Irritable and demanding.  C/o knee pain. Denies SI, HI, AVH.  A- Scheduled medications administered to patient, per MD orders. Pain mgmt regime maintained. Support and encouragement provided.  Routine safety checks conducted every 15 minutes.  Patient informed to notify staff with problems or concerns. R- No adverse drug reactions noted. Patient contracts for safety at this time. Patient compliant with medications and treatment plan. Patient receptive to care.  Patient interacts well with others on the unit.  Patient remains safe at this time.

## 2023-04-23 NOTE — Group Note (Signed)
Date:  04/23/2023 Time:  10:56 AM  Group Topic/Focus:  Emotional Education:   The focus of this group is to discuss what feelings/emotions are, and how they are experienced. Spirituality:   The focus of this group is to discuss how one's spirituality can aide in recovery.    Participation Level:  Active  Participation Quality:  Appropriate, Attentive, Sharing, and Supportive  Affect:  Appropriate  Cognitive:  Alert, Appropriate, and Oriented  Insight: Appropriate and Good  Engagement in Group:  Engaged and Supportive  Modes of Intervention:  Activity  Additional Comments:    Alexis Frock 04/23/2023, 10:56 AM

## 2023-04-24 DIAGNOSIS — F3130 Bipolar disorder, current episode depressed, mild or moderate severity, unspecified: Secondary | ICD-10-CM | POA: Diagnosis not present

## 2023-04-24 NOTE — Plan of Care (Signed)

## 2023-04-24 NOTE — [facility] Group Notes (Signed)
[facility] Group Notes:  (Nursing/MHT/Case Management/Adjunct)  Date:  04/24/2023  Time: 1000  Type of Therapy:  Psychoeducational Skills  Participation Level:  Did Not Attend  Participation Quality:  na  Affect:  na  Cognitive:  na  Insight:  None  Engagement in Group:  na  Modes of Intervention:  na  Summary of Progress/Problems: Pt did not attend AM Psychoeducational group.   Malva Limes 04/24/2023, 12:06 PM

## 2023-04-24 NOTE — [facility] Group Notes (Signed)
Patients were given recreation activity of listening to music. Pt attended and was appropriate.

## 2023-04-24 NOTE — Progress Notes (Signed)
D- Patient alert and oriented. Flat affect. Endorses anxiety.  Denies SI, HI, AVH, and pain.  A- Scheduled medications administered to patient, per MD orders. Support and encouragement provided.  Routine safety checks conducted every 15 minutes.  Patient informed to notify staff with problems or concerns. R- No adverse drug reactions noted. Patient contracts for safety at this time. Patient compliant with medications and treatment plan. Patient receptive, calm, and cooperative. Patient interacts well with others on the unit.  Patient remains safe at this time.

## 2023-04-24 NOTE — Plan of Care (Signed)

## 2023-04-24 NOTE — Group Note (Signed)
Date:  04/24/2023 Time:  10:23 PM  Group Topic/Focus:  Making Healthy Choices:   The focus of this group is to help patients identify negative/unhealthy choices they were using prior to admission and identify positive/healthier coping strategies to replace them upon discharge.    Participation Level:  Active  Participation Quality:  Appropriate  Affect:  Appropriate  Cognitive:  Appropriate  Insight: Good  Engagement in Group:  Engaged  Modes of Intervention:  Discussion  Additional Comments:    Maeola Harman 04/24/2023, 10:23 PM

## 2023-04-24 NOTE — Group Note (Signed)
LCSW Group Therapy Note   Group Date: 04/24/2023 Start Time: 1320 End Time: 1400   Type of Therapy and Topic:  Group Therapy: 8 Dimensions of Wellness  Participation Level:  Active    Summary of Patient Progress:  The patient attended group. Patient proved open to input from peers and feedback from [facility]. The patient was respectful of peers. The patient participated during today's icebreaker questions. The patient stated that social wellness is challenging for him and that he needs to work at it.     Marshell Levan, LCSWA 04/24/2023  3:12 PM

## 2023-04-24 NOTE — Progress Notes (Signed)
Patient ID: Todd [name]., male   DOB: 05/04/1956, 67 y.o.   MRN: [id number] Pt presents with depressed mood, but brightens upon approach. [name] reports he is doing '' okay overall just dealing with my knee pain that really bothers me and having to get up and down to the bathroom last night didn't help. I really need a urinal '' [name] denies any SI HI or AV Hallucinations. No acute physical concerns and did request prn pain medication with noted good response. A urinal was also provided per pt request to aid in going to the bathroom at night. Pt has been visible in the dayroom for meals but has been isolative throughout shift except meal times. Pt is safe, able to make his needs known. He is mobilizing on the unit with his wheelchair, remains high fall risk and education given, pt remains close to nurses station. Pt is safe. Above discussed with MD this am. Will con't to monitor.

## 2023-04-24 NOTE — Progress Notes (Signed)
[facility] MD Progress Note  04/24/2023  Todd [name].  MRN:  [id number]  Subjective:   Todd [name]. is a 67 y.o. male patient presents to the [facility] emergency department complaining of bilateral knee pain and depression. Case discussed with staff, chart reviewed, patient seen today during rounds and in treatment team meeting.  Not much change in mental status.  Patient continues to feel depressed. He said he is working on his emotions, reports he wants to be "hopeful". Patient is in need of constant reassurance and support.  He was encouraged to attend groups and work on coping strategies.  He denies any intention to harm himself on the unit.  He denies auditory or visual hallucinations.   Principal Problem: Bipolar I disorder, most recent episode depressed (HCC) Diagnosis: Principal Problem:   Bipolar I disorder, most recent episode depressed (HCC) Active Problems:   Suicidal ideations   Past Psychiatric History: Patient reports history of bipolar disorder reportedly patient has history of alcohol dependence, opiate and cocaine dependence.     Past Medical History:  Past Medical History:  Diagnosis Date   Arthritis    "fingers" (07/14/2018)   Depression    DVT (deep venous thrombosis) (HCC) LLE   Hepatitis C     finished harvoni tx ~ 2017   Hypercholesterolemia    Hypertension    Prostate cancer (HCC) 3yrs ago   Pulmonary embolism and infarction (HCC) 07/13/2018   Sleep apnea    not currently using cpap, mask causing vertigo   Type II diabetes mellitus (HCC)     Past Surgical History:  Procedure Laterality Date   BIOPSY  01/12/2019   Procedure: BIOPSY;  Surgeon: Bernette Redbird, MD;  Location: [facility] ENDOSCOPY;  Service: Endoscopy;;   BIOPSY  06/23/2019   Procedure: BIOPSY;  Surgeon: Willis Modena, MD;  Location: [facility] ENDOSCOPY;  Service: Endoscopy;;   COLONOSCOPY WITH PROPOFOL N/A 02/19/2014   Procedure: COLONOSCOPY WITH PROPOFOL;  Surgeon: Charolett Bumpers, MD;  Location:  [facility] ENDOSCOPY;  Service: Endoscopy;  Laterality: N/A;   ENDOVENOUS ABLATION SAPHENOUS VEIN W/ LASER Left 11/22/2017   endovenous laser ablation L SSV by Josephina Gip MD    ESOPHAGEAL BRUSHING  06/23/2019   Procedure: ESOPHAGEAL BRUSHING;  Surgeon: Willis Modena, MD;  Location: [facility] ENDOSCOPY;  Service: Endoscopy;;   ESOPHAGOGASTRODUODENOSCOPY (EGD) WITH PROPOFOL N/A 01/12/2019   Procedure: ESOPHAGOGASTRODUODENOSCOPY (EGD) WITH PROPOFOL;  Surgeon: Bernette Redbird, MD;  Location: [facility] ENDOSCOPY;  Service: Endoscopy;  Laterality: N/A;  Patient is also scheduled for barium swallow; please notify radiology after patient's EGD is complete so that barium swallow follows the endoscopy, not vice versa   ESOPHAGOGASTRODUODENOSCOPY (EGD) WITH PROPOFOL N/A 06/23/2019   Procedure: ESOPHAGOGASTRODUODENOSCOPY (EGD) WITH PROPOFOL;  Surgeon: Willis Modena, MD;  Location: [facility] ENDOSCOPY;  Service: Endoscopy;  Laterality: N/A;   PROSTATECTOMY  2008   REPAIR QUADRICEPS / HAMSTRING MUSCLE Right    Family History:  Family History  Problem Relation Age of Onset   Cancer Father        PROSTATE    Social History:  Social History   Substance and Sexual Activity  Alcohol Use Yes     Social History   Substance and Sexual Activity  Drug Use Yes    Social History   Socioeconomic History   Marital status: Single    Spouse name: Not on file   Number of children: 2   Years of education: 12th   Highest education level: Not on file  Occupational History  Occupation: post office  Tobacco Use   Smoking status: Every Day    Current packs/day: 0.12    Average packs/day: 0.1 packs/day for 45.0 years (5.4 ttl pk-yrs)    Types: Cigarettes   Smokeless tobacco: Never  Vaping Use   Vaping status: Never Used  Substance and Sexual Activity   Alcohol use: Yes   Drug use: Yes   Sexual activity: Not Currently  Other Topics Concern   Not on file  Social History Narrative   ** Merged History Encounter **        Patient lives at home alone.Marland KitchenMarland KitchenDrinks coffee daily    Social Determinants of Health   Financial Resource Strain: Low Risk  (10/27/2022)   Received from [facility]   Overall Financial Resource Strain (CARDIA)    Difficulty of Paying Living Expenses: Not very hard  Food Insecurity: No Food Insecurity (04/20/2023)   Hunger Vital Sign    Worried About Running Out of Food in the Last Year: Never true    Ran Out of Food in the Last Year: Never true  Transportation Needs: No Transportation Needs (04/20/2023)   PRAPARE - Administrator, Civil Service (Medical): No    Lack of Transportation (Non-Medical): No  Physical Activity: Inactive (10/27/2022)   Received from [facility]   Exercise Vital Sign    Days of Exercise per Week: 0 days    Minutes of Exercise per Session: 0 min  Stress: Stress Concern Present (10/27/2022)   Received from [facility] - Occupational Stress Questionnaire    Feeling of Stress : To some extent  Social Connections: Socially Isolated (10/27/2022)   Received from [facility]   Social Connection and Isolation Panel [NHANES]    Frequency of Communication with Friends and Family: More than three times a week    Frequency of Social Gatherings with Friends and Family: More than three times a week    Attends Religious Services: Never    Database administrator or Organizations: No    Attends Engineer, structural: Never    Marital Status: Divorced   Additional Social History:                         Sleep: Poor  Appetite:  Fair  Current Medications: Current Facility-Administered Medications  Medication Dose Route Frequency Provider Last Rate Last Admin   alum & mag hydroxide-simeth (MAALOX/MYLANTA) 200-200-20 MG/5ML suspension 30 mL  30 mL Oral Q4H PRN Weber, Kyra A, NP       amLODipine (NORVASC) tablet 5 mg  5 mg Oral Daily Weber, Kyra A, NP   5 mg at 04/24/23 0847   busPIRone  (BUSPAR) tablet 7.5 mg  7.5 mg Oral BID Lewanda Rife, MD   7.5 mg at 04/24/23 0847   diphenhydrAMINE (BENADRYL) capsule 50 mg  50 mg Oral TID PRN Weber, Bella Kennedy A, NP       Or   diphenhydrAMINE (BENADRYL) injection 50 mg  50 mg Intramuscular TID PRN Weber, Kyra A, NP       divalproex (DEPAKOTE) DR tablet 500 mg  500 mg Oral BID Lewanda Rife, MD   500 mg at 04/24/23 0846   haloperidol (HALDOL) tablet 5 mg  5 mg Oral TID PRN Weber, Kyra A, NP       Or   haloperidol lactate (HALDOL) injection 5 mg  5 mg Intramuscular TID PRN  Weber, Leavy Cella, NP       HYDROcodone-acetaminophen (NORCO) 10-325 MG per tablet 1 tablet  1 tablet Oral Q8H PRN Lewanda Rife, MD   1 tablet at 04/24/23 0854   hydrOXYzine (ATARAX) tablet 25 mg  25 mg Oral Q4H PRN Lewanda Rife, MD       ibuprofen (ADVIL) tablet 400 mg  400 mg Oral Q6H PRN Lewanda Rife, MD   400 mg at 04/22/23 1954   influenza vaccine adjuvanted (FLUAD) injection 0.5 mL  0.5 mL Intramuscular Tomorrow-1000 Sarina Ill, DO       LORazepam (ATIVAN) tablet 2 mg  2 mg Oral TID PRN Weber, Bella Kennedy A, NP       Or   LORazepam (ATIVAN) injection 2 mg  2 mg Intramuscular TID PRN Weber, Bella Kennedy A, NP       magnesium hydroxide (MILK OF MAGNESIA) suspension 30 mL  30 mL Oral Daily PRN Weber, Kyra A, NP       melatonin tablet 5 mg  5 mg Oral QHS Lewanda Rife, MD   5 mg at 04/23/23 2121   nicotine (NICODERM CQ - dosed in mg/24 hours) patch 14 mg  14 mg Transdermal Daily Lewanda Rife, MD   14 mg at 04/24/23 0847   pneumococcal 20-valent conjugate vaccine (PREVNAR 20) injection 0.5 mL  0.5 mL Intramuscular Tomorrow-1000 Sarina Ill, DO       sertraline (ZOLOFT) tablet 50 mg  50 mg Oral Daily Lewanda Rife, MD   50 mg at 04/24/23 0846   traZODone (DESYREL) tablet 150 mg  150 mg Oral QHS Weber, Kyra A, NP   150 mg at 04/23/23 2120    Lab Results:  No results found for this or any previous visit (from the past 48  hour(s)).   Blood Alcohol level:  Lab Results  Component Value Date   ETH <10 04/20/2023   ETH <10 02/15/2022    Metabolic Disorder Labs: Lab Results  Component Value Date   HGBA1C 5.8 (H) 09/02/2021   MPG 119.76 09/02/2021   MPG 111.15 09/03/2020   No results found for: "PROLACTIN" Lab Results  Component Value Date   CHOL  03/11/2009    128        ATP III CLASSIFICATION:  <200     mg/dL   Desirable  161-096  mg/dL   Borderline High  >=045    mg/dL   High          TRIG 59 03/11/2009   HDL 45 03/11/2009   CHOLHDL 2.8 03/11/2009   VLDL 12 03/11/2009   LDLCALC  03/11/2009    71        Total Cholesterol/HDL:CHD Risk Coronary Heart Disease Risk Table                     Men   Women  1/2 Average Risk   3.4   3.3  Average Risk       5.0   4.4  2 X Average Risk   9.6   7.1  3 X Average Risk  23.4   11.0        Use the calculated Patient Ratio above and the CHD Risk Table to determine the patient's CHD Risk.        ATP III CLASSIFICATION (LDL):  <100     mg/dL   Optimal  409-811  mg/dL   Near or Above  Optimal  130-159  mg/dL   Borderline  540-981  mg/dL   High  >191     mg/dL   Very High    Physical Findings: AIMS:  , ,  ,  ,    CIWA:    COWS:     Musculoskeletal: Strength & Muscle Tone: within normal limits Gait & Station:  Uses wheel chair Patient leans: N/A   Psychiatric Specialty Exam:   Presentation  General Appearance:  Appropriate for Environment   Eye Contact: Fair   Speech: Slow; Clear and Coherent   Speech Volume: Decreased   Handedness: Right     Mood and Affect  Mood: "Depressed"   Affect: Congruent; Constricted; Restricted; Tearful     Thought Process  Thought Processes: Coherent; Goal Directed   Descriptions of Associations:Intact   Orientation:Full (Time, Place and Person)   Thought Content:Abstract Reasoning   History of Schizophrenia/Schizoaffective disorder:No    Hallucinations:Hallucinations: None   Ideas of Reference:None   Suicidal Thoughts:Suicidal Thoughts: Yes, Active SI Active Intent and/or Plan: With Plan   Homicidal Thoughts:Homicidal Thoughts: No     Sensorium  Memory: Immediate Fair; Recent Fair   Judgment: Impaired   Insight: Shallow     Executive Functions  Concentration: Fair   Attention Span: Fair   Recall: [employer] of Knowledge: Fair   Language: Fair     Psychomotor Activity  Psychomotor Activity: Psychomotor Activity: Decreased     Assets  Assets: Communication Skills; Desire for Improvement; Housing     Sleep  Sleep: Sleep: Poor Number of Hours of Sleep: 3       Physical Exam: Physical Exam Constitutional:      Appearance: Normal appearance.  HENT:     Head: Normocephalic and atraumatic.     Nose: Nose normal.  Eyes:     Pupils: Pupils are equal, round, and reactive to light.  Cardiovascular:     Rate and Rhythm: Normal rate.  Pulmonary:     Effort: Pulmonary effort is normal.  Skin:    General: Skin is warm.  Neurological:     General: No focal deficit present.     Mental Status: He is alert and oriented to person, place, and time.      Review of Systems  Constitutional:  Positive for malaise/fatigue. Negative for fever.  HENT:  Negative for congestion and hearing loss.   Eyes:  Negative for blurred vision and double vision.  Respiratory:  Negative for cough and shortness of breath.   Cardiovascular:  Negative for chest pain and palpitations.  Gastrointestinal:  Negative for heartburn, nausea and vomiting.  Musculoskeletal:  Positive for back pain and joint pain.  Neurological:  Negative for dizziness and speech change.  Psychiatric/Behavioral:  Positive for depression, substance abuse and suicidal ideas. The patient is nervous/anxious and has insomnia.    Blood pressure 137/82, pulse 66, temperature 98.2 F (36.8 C), resp. rate 16, height 5\' 9"  (1.753 m), weight  92.5 kg, SpO2 95%. Body mass index is 30.13 kg/m.   Treatment Plan Summary: Daily contact with patient to assess and evaluate symptoms and progress in treatment and Medication management  1.  Patient has been admitted to locked unit under safety precautions 2.  Patient has discontinued Wellbutrin to get due to diagnosis of bipolar disorder,  Continue Zoloft 50 mg by mouth daily 3.  Increased the dose of Depakote to 500 mg twice daily for mood stabilization 04/22/2023 4.   Increase the dose of BuSpar to  7.5 mg by mouth twice daily for anxiety 04/23/2023 5.  Patient was encouraged to attend group and work on a safe discharge plan 6.  UDS positive for opiats 7.  Will consult social worker to get collateral and help with a safe discharge plan 8.  Patient is on trazodone 150 mg as needed at bedtime to help with depression and insomnia,  Melatonin 5 mg at bedtime for insomnia  04/23/2023  Lewanda Rife, MD

## 2023-04-25 DIAGNOSIS — F3130 Bipolar disorder, current episode depressed, mild or moderate severity, unspecified: Secondary | ICD-10-CM | POA: Diagnosis not present

## 2023-04-25 NOTE — Plan of Care (Signed)
Problem: Education: Goal: Knowledge of General Education information will improve Description: Including pain rating scale, medication(s)/side effects and non-pharmacologic comfort measures Outcome: Progressing   Problem: Health Behavior/Discharge Planning: Goal: Ability to manage health-related needs will improve Outcome: Progressing   Problem: Clinical Measurements: Goal: Ability to maintain clinical measurements within normal limits will improve Outcome: Progressing Goal: Will remain free from infection Outcome: Progressing Goal: Diagnostic test results will improve Outcome: Progressing Goal: Respiratory complications will improve Outcome: Progressing Goal: Cardiovascular complication will be avoided Outcome: Progressing   Problem: Activity: Goal: Risk for activity intolerance will decrease Outcome: Progressing   Problem: Nutrition: Goal: Adequate nutrition will be maintained Outcome: Progressing   Problem: Coping: Goal: Level of anxiety will decrease Outcome: Progressing   Problem: Elimination: Goal: Will not experience complications related to bowel motility Outcome: Progressing Goal: Will not experience complications related to urinary retention Outcome: Progressing   Problem: Pain Managment: Goal: General experience of comfort will improve Outcome: Progressing   Problem: Safety: Goal: Ability to remain free from injury will improve Outcome: Progressing   Problem: Skin Integrity: Goal: Risk for impaired skin integrity will decrease Outcome: Progressing   Problem: Education: Goal: Knowledge of [place] General Education information/materials will improve Outcome: Progressing Goal: Emotional status will improve Outcome: Progressing Goal: Mental status will improve Outcome: Progressing Goal: Verbalization of understanding the information provided will improve Outcome: Progressing   Problem: Activity: Goal: Interest or engagement in activities will  improve Outcome: Progressing Goal: Sleeping patterns will improve Outcome: Progressing   Problem: Coping: Goal: Ability to verbalize frustrations and anger appropriately will improve Outcome: Progressing Goal: Ability to demonstrate self-control will improve Outcome: Progressing   Problem: Health Behavior/Discharge Planning: Goal: Identification of resources available to assist in meeting health care needs will improve Outcome: Progressing Goal: Compliance with treatment plan for underlying cause of condition will improve Outcome: Progressing   Problem: Physical Regulation: Goal: Ability to maintain clinical measurements within normal limits will improve Outcome: Progressing   Problem: Safety: Goal: Periods of time without injury will increase Outcome: Progressing   Problem: Education: Goal: Ability to make informed decisions regarding treatment will improve Outcome: Progressing   Problem: Coping: Goal: Coping ability will improve Outcome: Progressing   Problem: Health Behavior/Discharge Planning: Goal: Identification of resources available to assist in meeting health care needs will improve Outcome: Progressing   Problem: Medication: Goal: Compliance with prescribed medication regimen will improve Outcome: Progressing   Problem: Self-Concept: Goal: Ability to disclose and discuss suicidal ideas will improve Outcome: Progressing Goal: Will verbalize positive feelings about self Outcome: Progressing   Problem: Education: Goal: Utilization of techniques to improve thought processes will improve Outcome: Progressing Goal: Knowledge of the prescribed therapeutic regimen will improve Outcome: Progressing   Problem: Activity: Goal: Interest or engagement in leisure activities will improve Outcome: Progressing Goal: Imbalance in normal sleep/wake cycle will improve Outcome: Progressing   Problem: Coping: Goal: Coping ability will improve Outcome:  Progressing Goal: Will verbalize feelings Outcome: Progressing   Problem: Health Behavior/Discharge Planning: Goal: Ability to make decisions will improve Outcome: Progressing Goal: Compliance with therapeutic regimen will improve Outcome: Progressing   Problem: Role Relationship: Goal: Will demonstrate positive changes in social behaviors and relationships Outcome: Progressing   Problem: Safety: Goal: Ability to disclose and discuss suicidal ideas will improve Outcome: Progressing Goal: Ability to identify and utilize support systems that promote safety will improve Outcome: Progressing   Problem: Self-Concept: Goal: Will verbalize positive feelings about self Outcome: Progressing Goal: Level of anxiety will decrease   Outcome: Progressing   

## 2023-04-25 NOTE — BHH Counselor (Signed)
CSW gave pt list of boarding homes and privately owned apartment complexes per his request.    Reynaldo Minium, MSW, [facility] 04/25/2023 11:58 AM

## 2023-04-25 NOTE — Plan of Care (Signed)

## 2023-04-25 NOTE — Progress Notes (Signed)
   04/25/23 0900  Psych Admission Type (Psych Patients Only)  Admission Status Voluntary  Psychosocial Assessment  Patient Complaints Anxiety  Eye Contact Fair  Facial Expression Animated  Affect Irritable;Labile  Speech Logical/coherent  Interaction Needy;Demanding  Motor Activity Slow  Appearance/Hygiene Disheveled  Behavior Characteristics Irritable  Mood Irritable;Angry  Thought Process  Coherency WDL  Content WDL  Delusions None reported or observed  Perception WDL  Hallucination None reported or observed  Judgment Impaired  Confusion None  Danger to Self  Current suicidal ideation? Denies  Danger to Others  Danger to Others None reported or observed

## 2023-04-25 NOTE — Progress Notes (Signed)
D- Patient alert and oriented. Bright affect. Amb via W/C. Self propels. C/o knee. Denies SI, HI, AVH. A- Scheduled medications administered to patient, per MD orders. PRN given for pain as ordered.  Support and encouragement provided.  Routine safety checks conducted every 15 minutes.  Patient informed to notify staff with problems or concerns. R- No adverse drug reactions noted. Patient contracts for safety at this time. Patient compliant with medications and treatment plan. Visible in dayroom. Watching TV and attends group. Patient interacts well with others on the unit.  Patient remains safe at this time.

## 2023-04-25 NOTE — Group Note (Signed)
Recreation Therapy Group Note   Group Topic:General Recreation  Group Date: 04/25/2023 Start Time: 1400 End Time: 1445 Facilitators: Rosina Lowenstein, LRT, CTRS Location: Courtyard  Group Description: Emotional Check in. Patient sat and talked with LRT about how they are doing and whatever else is on their mind. LRT provided active listening, reassurance and encouragement. Pts were given the opportunity to listen to music or play cornhole while getting fresh air and sunlight in the courtyard.    Goal Area(s) Addressed: Patient will engage in conversation with LRT. Patient will communicate their wants, needs, or questions.  Patient will practice a new coping skill of "talking to someone".   Affect/Mood: N/A   Participation Level: None    Clinical Observations/Individualized Feedback: [name] came outside for the last 3 minutes remaninig of group. Pt sat in the doorway of the unit and the courtyard. Pt did not interact with LRT or peers.  Plan: Continue to engage patient in RT group sessions 2-3x/week.   Rosina Lowenstein, LRT, CTRS 04/25/2023 3:34 PM

## 2023-04-25 NOTE — Progress Notes (Signed)
[facility] MD Progress Note  04/25/2023  [name].  MRN:  [id number]  Subjective:   Todd [name]. is a 67 y.o. male patient presents to the [facility] emergency department complaining of bilateral knee pain and depression. Case discussed with staff, chart reviewed, patient seen today during rounds.  Patient reports better today.  He feels hopeful.  He said he is working on his emotions. Patient is in need of constant reassurance and support.  He was encouraged to attend groups and work on coping strategies.  No intention to harm himself on the unit.  He denies auditory or visual hallucinations.   Principal Problem: Bipolar I disorder, most recent episode depressed (HCC) Diagnosis: Principal Problem:   Bipolar I disorder, most recent episode depressed (HCC) Active Problems:   Suicidal ideations   Past Psychiatric History: Patient reports history of bipolar disorder reportedly patient has history of alcohol dependence, opiate and cocaine dependence.     Past Medical History:  Past Medical History:  Diagnosis Date   Arthritis    "fingers" (07/14/2018)   Depression    DVT (deep venous thrombosis) (HCC) LLE   Hepatitis C     finished harvoni tx ~ 2017   Hypercholesterolemia    Hypertension    Prostate cancer (HCC) 73yrs ago   Pulmonary embolism and infarction (HCC) 07/13/2018   Sleep apnea    not currently using cpap, mask causing vertigo   Type II diabetes mellitus (HCC)     Past Surgical History:  Procedure Laterality Date   BIOPSY  01/12/2019   Procedure: BIOPSY;  Surgeon: Bernette Redbird, MD;  Location: [facility] ENDOSCOPY;  Service: Endoscopy;;   BIOPSY  06/23/2019   Procedure: BIOPSY;  Surgeon: Willis Modena, MD;  Location: [facility] ENDOSCOPY;  Service: Endoscopy;;   COLONOSCOPY WITH PROPOFOL N/A 02/19/2014   Procedure: COLONOSCOPY WITH PROPOFOL;  Surgeon: Charolett Bumpers, MD;  Location: [facility] ENDOSCOPY;  Service: Endoscopy;  Laterality: N/A;   ENDOVENOUS ABLATION SAPHENOUS VEIN W/  LASER Left 11/22/2017   endovenous laser ablation L SSV by Josephina Gip MD    ESOPHAGEAL BRUSHING  06/23/2019   Procedure: ESOPHAGEAL BRUSHING;  Surgeon: Willis Modena, MD;  Location: [facility] ENDOSCOPY;  Service: Endoscopy;;   ESOPHAGOGASTRODUODENOSCOPY (EGD) WITH PROPOFOL N/A 01/12/2019   Procedure: ESOPHAGOGASTRODUODENOSCOPY (EGD) WITH PROPOFOL;  Surgeon: Bernette Redbird, MD;  Location: [facility] ENDOSCOPY;  Service: Endoscopy;  Laterality: N/A;  Patient is also scheduled for barium swallow; please notify radiology after patient's EGD is complete so that barium swallow follows the endoscopy, not vice versa   ESOPHAGOGASTRODUODENOSCOPY (EGD) WITH PROPOFOL N/A 06/23/2019   Procedure: ESOPHAGOGASTRODUODENOSCOPY (EGD) WITH PROPOFOL;  Surgeon: Willis Modena, MD;  Location: [facility] ENDOSCOPY;  Service: Endoscopy;  Laterality: N/A;   PROSTATECTOMY  2008   REPAIR QUADRICEPS / HAMSTRING MUSCLE Right    Family History:  Family History  Problem Relation Age of Onset   Cancer Father        PROSTATE    Social History:  Social History   Substance and Sexual Activity  Alcohol Use Yes     Social History   Substance and Sexual Activity  Drug Use Yes    Social History   Socioeconomic History   Marital status: Single    Spouse name: Not on file   Number of children: 2   Years of education: 12th   Highest education level: Not on file  Occupational History   Occupation: post office  Tobacco Use   Smoking status: Every Day  Current packs/day: 0.12    Average packs/day: 0.1 packs/day for 45.0 years (5.4 ttl pk-yrs)    Types: Cigarettes   Smokeless tobacco: Never  Vaping Use   Vaping status: Never Used  Substance and Sexual Activity   Alcohol use: Yes   Drug use: Yes   Sexual activity: Not Currently  Other Topics Concern   Not on file  Social History Narrative   ** Merged History Encounter **       Patient lives at home alone.Marland KitchenMarland KitchenDrinks coffee daily    Social Determinants of Health   Financial  Resource Strain: Low Risk  (10/27/2022)   Received from [facility]   Overall Financial Resource Strain (CARDIA)    Difficulty of Paying Living Expenses: Not very hard  Food Insecurity: No Food Insecurity (04/20/2023)   Hunger Vital Sign    Worried About Running Out of Food in the Last Year: Never true    Ran Out of Food in the Last Year: Never true  Transportation Needs: No Transportation Needs (04/20/2023)   PRAPARE - Administrator, Civil Service (Medical): No    Lack of Transportation (Non-Medical): No  Physical Activity: Inactive (10/27/2022)   Received from [facility]   Exercise Vital Sign    Days of Exercise per Week: 0 days    Minutes of Exercise per Session: 0 min  Stress: Stress Concern Present (10/27/2022)   Received from [facility] of Occupational Health - Occupational Stress Questionnaire    Feeling of Stress : To some extent  Social Connections: Socially Isolated (10/27/2022)   Received from [facility]   Social Connection and Isolation Panel [NHANES]    Frequency of Communication with Friends and Family: More than three times a week    Frequency of Social Gatherings with Friends and Family: More than three times a week    Attends Religious Services: Never    Database administrator or Organizations: No    Attends Engineer, structural: Never    Marital Status: Divorced   Additional Social History:                         Sleep: Poor  Appetite:  Fair  Current Medications: Current Facility-Administered Medications  Medication Dose Route Frequency Provider Last Rate Last Admin   alum & mag hydroxide-simeth (MAALOX/MYLANTA) 200-200-20 MG/5ML suspension 30 mL  30 mL Oral Q4H PRN Weber, Kyra A, NP       amLODipine (NORVASC) tablet 5 mg  5 mg Oral Daily Weber, Kyra A, NP   5 mg at 04/25/23 0832   busPIRone (BUSPAR) tablet 7.5 mg  7.5 mg Oral BID Lewanda Rife, MD   7.5 mg at 04/25/23 1610    diphenhydrAMINE (BENADRYL) capsule 50 mg  50 mg Oral TID PRN Weber, Bella Kennedy A, NP       Or   diphenhydrAMINE (BENADRYL) injection 50 mg  50 mg Intramuscular TID PRN Weber, Kyra A, NP       divalproex (DEPAKOTE) DR tablet 500 mg  500 mg Oral BID Lewanda Rife, MD   500 mg at 04/25/23 9604   haloperidol (HALDOL) tablet 5 mg  5 mg Oral TID PRN Weber, Kyra A, NP       Or   haloperidol lactate (HALDOL) injection 5 mg  5 mg Intramuscular TID PRN Weber, Kyra A, NP       HYDROcodone-acetaminophen (NORCO) 10-325 MG per  tablet 1 tablet  1 tablet Oral Q8H PRN Lewanda Rife, MD   1 tablet at 04/25/23 2725   hydrOXYzine (ATARAX) tablet 25 mg  25 mg Oral Q4H PRN Lewanda Rife, MD       ibuprofen (ADVIL) tablet 400 mg  400 mg Oral Q6H PRN Lewanda Rife, MD   400 mg at 04/24/23 1353   influenza vaccine adjuvanted (FLUAD) injection 0.5 mL  0.5 mL Intramuscular Tomorrow-1000 Sarina Ill, DO       LORazepam (ATIVAN) tablet 2 mg  2 mg Oral TID PRN Weber, Bella Kennedy A, NP       Or   LORazepam (ATIVAN) injection 2 mg  2 mg Intramuscular TID PRN Weber, Kyra A, NP       magnesium hydroxide (MILK OF MAGNESIA) suspension 30 mL  30 mL Oral Daily PRN Weber, Kyra A, NP   30 mL at 04/24/23 1609   melatonin tablet 5 mg  5 mg Oral QHS Lewanda Rife, MD   5 mg at 04/24/23 2116   nicotine (NICODERM CQ - dosed in mg/24 hours) patch 14 mg  14 mg Transdermal Daily Lewanda Rife, MD   14 mg at 04/25/23 0841   pneumococcal 20-valent conjugate vaccine (PREVNAR 20) injection 0.5 mL  0.5 mL Intramuscular Tomorrow-1000 Sarina Ill, DO       sertraline (ZOLOFT) tablet 50 mg  50 mg Oral Daily Lewanda Rife, MD   50 mg at 04/25/23 3664   traZODone (DESYREL) tablet 150 mg  150 mg Oral QHS Weber, Kyra A, NP   150 mg at 04/24/23 2116    Lab Results:  No results found for this or any previous visit (from the past 48 hour(s)).   Blood Alcohol level:  Lab Results  Component Value Date   ETH <10  04/20/2023   ETH <10 02/15/2022    Metabolic Disorder Labs: Lab Results  Component Value Date   HGBA1C 5.8 (H) 09/02/2021   MPG 119.76 09/02/2021   MPG 111.15 09/03/2020   No results found for: "PROLACTIN" Lab Results  Component Value Date   CHOL  03/11/2009    128        ATP III CLASSIFICATION:  <200     mg/dL   Desirable  403-474  mg/dL   Borderline High  >=259    mg/dL   High          TRIG 59 03/11/2009   HDL 45 03/11/2009   CHOLHDL 2.8 03/11/2009   VLDL 12 03/11/2009   LDLCALC  03/11/2009    71        Total Cholesterol/HDL:CHD Risk Coronary Heart Disease Risk Table                     Men   Women  1/2 Average Risk   3.4   3.3  Average Risk       5.0   4.4  2 X Average Risk   9.6   7.1  3 X Average Risk  23.4   11.0        Use the calculated Patient Ratio above and the CHD Risk Table to determine the patient's CHD Risk.        ATP III CLASSIFICATION (LDL):  <100     mg/dL   Optimal  563-875  mg/dL   Near or Above                    Optimal  130-159  mg/dL   Borderline  161-096  mg/dL   High  >045     mg/dL   Very High    Physical Findings: AIMS:  , ,  ,  ,    CIWA:    COWS:     Musculoskeletal: Strength & Muscle Tone: within normal limits Gait & Station:  Uses wheel chair Patient leans: N/A   Psychiatric Specialty Exam:   Presentation  General Appearance:  Appropriate for Environment   Eye Contact: Fair   Speech: Slow; Clear and Coherent   Speech Volume: Decreased   Handedness: Right     Mood and Affect  Mood: "Little better"   Affect: Congruent;  less Constricted    Thought Process  Thought Processes: Coherent; Goal Directed   Descriptions of Associations:Intact   Orientation:Full (Time, Place and Person)   Thought Content:Abstract Reasoning   History of Schizophrenia/Schizoaffective disorder:No   Hallucinations:Hallucinations: None   Ideas of Reference:None   Suicidal Thoughts:Suicidal Thoughts: None reported    Homicidal Thoughts:Homicidal Thoughts: No     Sensorium  Memory: Immediate Fair; Recent Fair   Judgment: Improving   Insight: Improving     Executive Functions  Concentration: Fair   Attention Span: Fair   Recall: [employer] of Knowledge: Fair   Language: Fair     Psychomotor Activity  Psychomotor Activity: Psychomotor Activity: Decreased     Assets  Assets: Communication Skills; Desire for Improvement; Housing     Sleep  Sleep: Sleep: Poor Number of Hours of Sleep: 3       Physical Exam: Physical Exam Constitutional:      Appearance: Normal appearance.  HENT:     Head: Normocephalic and atraumatic.     Nose: Nose normal.  Eyes:     Pupils: Pupils are equal, round, and reactive to light.  Cardiovascular:     Rate and Rhythm: Normal rate.  Pulmonary:     Effort: Pulmonary effort is normal.  Skin:    General: Skin is warm.  Neurological:     General: No focal deficit present.     Mental Status: He is alert and oriented to person, place, and time.      Review of Systems  Constitutional:  Positive for malaise/fatigue. Negative for fever.  HENT:  Negative for congestion and hearing loss.   Eyes:  Negative for blurred vision and double vision.  Respiratory:  Negative for cough and shortness of breath.   Cardiovascular:  Negative for chest pain and palpitations.  Gastrointestinal:  Negative for heartburn, nausea and vomiting.  Musculoskeletal:  Positive for back pain and joint pain.  Neurological:  Negative for dizziness and speech change.  Psychiatric/Behavioral:  Positive for depression, substance abuse and suicidal ideas. The patient is nervous/anxious and has insomnia.    Blood pressure 124/78, pulse 70, temperature 97.9 F (36.6 C), resp. rate 18, height 5\' 9"  (1.753 m), weight 92.5 kg, SpO2 94%. Body mass index is 30.13 kg/m.   Treatment Plan Summary: Daily contact with patient to assess and evaluate symptoms and progress in  treatment and Medication management  1.  Patient has been admitted to locked unit under safety precautions 2.  Patient has discontinued Wellbutrin to get due to diagnosis of bipolar disorder,  Continue Zoloft 50 mg by mouth daily 3.  Increased the dose of Depakote to 500 mg twice daily for mood stabilization 04/22/2023 4.   Increased the dose of BuSpar to 7.5 mg by mouth twice daily for anxiety 04/23/2023 5.  Patient was encouraged to attend group and work on a safe discharge plan 6.  UDS positive for opiats 7.  Will consult social worker to get collateral and help with a safe discharge plan 8.  Patient is on trazodone 150 mg as needed at bedtime to help with depression and insomnia,  Melatonin 5 mg at bedtime for insomnia  04/23/2023  Lewanda Rife, MD

## 2023-04-25 NOTE — Group Note (Signed)
Date:  04/25/2023 Time:  10:42 PM  Group Topic/Focus:  Developing a Wellness Toolbox:   The focus of this group is to help patients develop a "wellness toolbox" with skills and strategies to promote recovery upon discharge.    Participation Level:  Active  Participation Quality:  Appropriate  Affect:  Appropriate  Cognitive:  Appropriate  Insight: Improving  Engagement in Group:  Engaged  Modes of Intervention:  Discussion  Additional Comments:    Maeola Harman 04/25/2023, 10:42 PM

## 2023-04-26 DIAGNOSIS — F3130 Bipolar disorder, current episode depressed, mild or moderate severity, unspecified: Secondary | ICD-10-CM | POA: Diagnosis not present

## 2023-04-26 MED ORDER — TRAZODONE HCL 100 MG PO TABS
200.0000 mg | ORAL_TABLET | Freq: Every day | ORAL | Status: DC
  Administered 2023-04-26 – 2023-05-01 (×6): 200 mg via ORAL
  Filled 2023-04-26 (×6): qty 2

## 2023-04-26 MED ORDER — SERTRALINE HCL 50 MG PO TABS
100.0000 mg | ORAL_TABLET | Freq: Every day | ORAL | Status: DC
  Administered 2023-04-27 – 2023-04-29 (×3): 100 mg via ORAL
  Filled 2023-04-26 (×3): qty 2

## 2023-04-26 NOTE — Group Note (Signed)
Date:  04/26/2023 Time:  8:19 PM  Group Topic/Focus:  Wrap-Up Group:   The focus of this group is to help patients review their daily goal of treatment and discuss progress on daily workbooks.    Participation Level:  Active  Participation Quality:  Appropriate  Affect:  Appropriate  Cognitive:  Appropriate  Insight: Appropriate  Engagement in Group:  Engaged  Modes of Intervention:  Discussion  Additional Comments:    Osker Mason 04/26/2023, 8:19 PM

## 2023-04-26 NOTE — Progress Notes (Signed)
Progress note   D: Pt seen in his room. Pt denies SI, HI, AVH. Pt rates pain  10/10 as acute headache and also as chronic bilateral knee pain. Pt endorses anxiety and depression. "This is not my first rodeo. I'm 67 so I've been around the block. I don't like repeatedly being asked if I'm suicidal like I can turn it on and off. Circumstances may change you know and things are different. I got a lot on me right now. I've had deaths in the family and things to take care of." Pt states that he does get up and use the wheelchair because of his chronic knee pain. He says he has no cartilage anymore. Says that his first knee surgery is scheduled and that his doctor is ordering a better walker for him so he can ambulate more. Pt is irritable at times. No other concerns noted at this time.  A: Pt provided support and encouragement. Pt given scheduled medication as prescribed. PRNs as appropriate. Q15 min checks for safety.   R: Pt safe on the unit. Will continue to monitor.

## 2023-04-26 NOTE — Progress Notes (Signed)
Todd [name] aka "[name]" is alert / oriented x 4.  He is pleasant, cooperative, and flirtatious.  At 20:00 he propelled himself to the nurse's station without difficulty.  This Clinical research associate observed no s/sx. of acute distress.  He requested his "pain medicine".  This Clinical research associate explained that he was  approximately 5 hours early -  he said "okay" and went to the dayroom.  He participated in group along with one other patient.  He was engaged & talkative.  He took all meds. without difficulty, and reported that he was going to "wait up for my pain medicine".  He dozed several times in his wheelchair while waiting for 1am dose of pain medicine.  He reported that his pain was a 4/10.  He joked and laughed at the nurse's station when he was not dozing.  He was given his PRN PO dose of Norco as ordered.  Q82m safety checks completed.  Plan of Care in place and being followed.

## 2023-04-26 NOTE — Group Note (Signed)
Recreation Therapy Group Note   Group Topic:Coping Skills  Group Date: 04/26/2023 Start Time: 1400 End Time: 1455 Facilitators: Rosina Lowenstein, LRT, CTRS Location:  Day Room  Group Description: Chair Yoga. LRT and patients discussed the benefits of yoga and how it differs from strength exercises. LRT educated patients on the mental and physical benefits of yoga and deep breathing and how it can be used as a Associate Professor. LRT and patients followed along to a guided yoga session on the television that focused on all parts of the body, as well as deep breathing. Pt encouraged to stop movement at any time if they feel discomfort or pain.   Goal Area(s) Addressed: Patient will practice using relaxation technique. Patient will identify a new coping skill.  Patient will follow multistep directions to reduce anxiety and stress.   Affect/Mood: Appropriate and Flat   Participation Level: Moderate   Participation Quality: Independent   Behavior: Calm and Cooperative   Speech/Thought Process: Coherent   Insight: Fair   Judgement: Fair    Modes of Intervention: Activity   Patient Response to Interventions:  Receptive   Education Outcome:  Acknowledges education   Clinical Observations/Individualized Feedback: Todd [name] was mostly active in their participation of session activities and group discussion. Pt completed most of the demonstrated exercises appropriately as shown. Pt did not want to go outside after the yoga session was complete and stayed in the dayroom, persistent that the TV be cut back on so he could watch.   Plan: Continue to engage patient in RT group sessions 2-3x/week.   Rosina Lowenstein, LRT, CTRS 04/26/2023 3:15 PM

## 2023-04-26 NOTE — Progress Notes (Signed)
[facility] MD Progress Note  04/26/2023 1:01 PM Todd [name].  MRN:  [id number] Subjective: Todd [name] is seen on rounds.  He is currently homeless and stays in a boardinghouse in [place].  He has a history of alcohol abuse.  Nurses report no issues with him.  He was having suicidal ideation when he presented to the hospital.  He is having, crack for safety.  His biggest problem is not sleeping very well.  We talked about going up on his medications.  He says he has a lot of knee pain.  He does not have an outpatient provider. Principal Problem: Bipolar I disorder, most recent episode depressed (HCC) Diagnosis: Principal Problem:   Bipolar I disorder, most recent episode depressed (HCC) Active Problems:   Suicidal ideations  Total Time spent with patient: 15 minutes  Past Psychiatric History:  History of depression and alcohol abuse Past Medical History:  Past Medical History:  Diagnosis Date   Arthritis    "fingers" (07/14/2018)   Depression    DVT (deep venous thrombosis) (HCC) LLE   Hepatitis C     finished harvoni tx ~ 2017   Hypercholesterolemia    Hypertension    Prostate cancer (HCC) 73yrs ago   Pulmonary embolism and infarction (HCC) 07/13/2018   Sleep apnea    not currently using cpap, mask causing vertigo   Type II diabetes mellitus (HCC)     Past Surgical History:  Procedure Laterality Date   BIOPSY  01/12/2019   Procedure: BIOPSY;  Surgeon: Bernette Redbird, MD;  Location: [facility] ENDOSCOPY;  Service: Endoscopy;;   BIOPSY  06/23/2019   Procedure: BIOPSY;  Surgeon: Willis Modena, MD;  Location: [facility] ENDOSCOPY;  Service: Endoscopy;;   COLONOSCOPY WITH PROPOFOL N/A 02/19/2014   Procedure: COLONOSCOPY WITH PROPOFOL;  Surgeon: Charolett Bumpers, MD;  Location: [facility] ENDOSCOPY;  Service: Endoscopy;  Laterality: N/A;   ENDOVENOUS ABLATION SAPHENOUS VEIN W/ LASER Left 11/22/2017   endovenous laser ablation L SSV by Josephina Gip MD    ESOPHAGEAL BRUSHING  06/23/2019   Procedure:  ESOPHAGEAL BRUSHING;  Surgeon: Willis Modena, MD;  Location: [facility] ENDOSCOPY;  Service: Endoscopy;;   ESOPHAGOGASTRODUODENOSCOPY (EGD) WITH PROPOFOL N/A 01/12/2019   Procedure: ESOPHAGOGASTRODUODENOSCOPY (EGD) WITH PROPOFOL;  Surgeon: Bernette Redbird, MD;  Location: [facility] ENDOSCOPY;  Service: Endoscopy;  Laterality: N/A;  Patient is also scheduled for barium swallow; please notify radiology after patient's EGD is complete so that barium swallow follows the endoscopy, not vice versa   ESOPHAGOGASTRODUODENOSCOPY (EGD) WITH PROPOFOL N/A 06/23/2019   Procedure: ESOPHAGOGASTRODUODENOSCOPY (EGD) WITH PROPOFOL;  Surgeon: Willis Modena, MD;  Location: [facility] ENDOSCOPY;  Service: Endoscopy;  Laterality: N/A;   PROSTATECTOMY  2008   REPAIR QUADRICEPS / HAMSTRING MUSCLE Right    Family History:  Family History  Problem Relation Age of Onset   Cancer Father        PROSTATE   Family Psychiatric  History: Unremarkable Social History:  Social History   Substance and Sexual Activity  Alcohol Use Yes     Social History   Substance and Sexual Activity  Drug Use Yes    Social History   Socioeconomic History   Marital status: Single    Spouse name: Not on file   Number of children: 2   Years of education: 12th   Highest education level: Not on file  Occupational History   Occupation: post office  Tobacco Use   Smoking status: Every Day    Current packs/day: 0.12    Average packs/day:  0.1 packs/day for 45.0 years (5.4 ttl pk-yrs)    Types: Cigarettes   Smokeless tobacco: Never  Vaping Use   Vaping status: Never Used  Substance and Sexual Activity   Alcohol use: Yes   Drug use: Yes   Sexual activity: Not Currently  Other Topics Concern   Not on file  Social History Narrative   ** Merged History Encounter **       Patient lives at home alone.Marland KitchenMarland KitchenDrinks coffee daily    Social Determinants of Health   Financial Resource Strain: Low Risk  (10/27/2022)   Received from [facility]   Overall  Financial Resource Strain (CARDIA)    Difficulty of Paying Living Expenses: Not very hard  Food Insecurity: No Food Insecurity (04/20/2023)   Hunger Vital Sign    Worried About Running Out of Food in the Last Year: Never true    Ran Out of Food in the Last Year: Never true  Transportation Needs: No Transportation Needs (04/20/2023)   PRAPARE - Administrator, Civil Service (Medical): No    Lack of Transportation (Non-Medical): No  Physical Activity: Inactive (10/27/2022)   Received from [facility]   Exercise Vital Sign    Days of Exercise per Week: 0 days    Minutes of Exercise per Session: 0 min  Stress: Stress Concern Present (10/27/2022)   Received from [facility] of Occupational Health - Occupational Stress Questionnaire    Feeling of Stress : To some extent  Social Connections: Socially Isolated (10/27/2022)   Received from [facility]   Social Connection and Isolation Panel [NHANES]    Frequency of Communication with Friends and Family: More than three times a week    Frequency of Social Gatherings with Friends and Family: More than three times a week    Attends Religious Services: Never    Database administrator or Organizations: No    Attends Engineer, structural: Never    Marital Status: Divorced   Additional Social History:                         Sleep: Poor  Appetite:  Fair  Current Medications: Current Facility-Administered Medications  Medication Dose Route Frequency Provider Last Rate Last Admin   alum & mag hydroxide-simeth (MAALOX/MYLANTA) 200-200-20 MG/5ML suspension 30 mL  30 mL Oral Q4H PRN Weber, Kyra A, NP       amLODipine (NORVASC) tablet 5 mg  5 mg Oral Daily Weber, Kyra A, NP   5 mg at 04/26/23 0901   busPIRone (BUSPAR) tablet 7.5 mg  7.5 mg Oral BID Lewanda Rife, MD   7.5 mg at 04/26/23 0901   diphenhydrAMINE (BENADRYL) capsule 50 mg  50 mg Oral TID PRN Phebe Colla A, NP       Or    diphenhydrAMINE (BENADRYL) injection 50 mg  50 mg Intramuscular TID PRN Weber, Kyra A, NP       divalproex (DEPAKOTE) DR tablet 500 mg  500 mg Oral BID Lewanda Rife, MD   500 mg at 04/26/23 0900   haloperidol (HALDOL) tablet 5 mg  5 mg Oral TID PRN Weber, Kyra A, NP       Or   haloperidol lactate (HALDOL) injection 5 mg  5 mg Intramuscular TID PRN Weber, Kyra A, NP       HYDROcodone-acetaminophen (NORCO) 10-325 MG per tablet 1 tablet  1 tablet Oral Q8H  PRN Lewanda Rife, MD   1 tablet at 04/26/23 0901   hydrOXYzine (ATARAX) tablet 25 mg  25 mg Oral Q4H PRN Lewanda Rife, MD       ibuprofen (ADVIL) tablet 400 mg  400 mg Oral Q6H PRN Lewanda Rife, MD   400 mg at 04/24/23 1353   influenza vaccine adjuvanted (FLUAD) injection 0.5 mL  0.5 mL Intramuscular Tomorrow-1000 Sarina Ill, DO       LORazepam (ATIVAN) tablet 2 mg  2 mg Oral TID PRN Weber, Bella Kennedy A, NP       Or   LORazepam (ATIVAN) injection 2 mg  2 mg Intramuscular TID PRN Weber, Bella Kennedy A, NP       magnesium hydroxide (MILK OF MAGNESIA) suspension 30 mL  30 mL Oral Daily PRN Weber, Kyra A, NP   30 mL at 04/24/23 1609   melatonin tablet 5 mg  5 mg Oral QHS Lewanda Rife, MD   5 mg at 04/25/23 2133   nicotine (NICODERM CQ - dosed in mg/24 hours) patch 14 mg  14 mg Transdermal Daily Lewanda Rife, MD   14 mg at 04/26/23 0900   pneumococcal 20-valent conjugate vaccine (PREVNAR 20) injection 0.5 mL  0.5 mL Intramuscular Tomorrow-1000 Sarina Ill, DO       [START ON 04/27/2023] sertraline (ZOLOFT) tablet 100 mg  100 mg Oral Daily Sarina Ill, DO       traZODone (DESYREL) tablet 200 mg  200 mg Oral QHS Sarina Ill, DO        Lab Results: No results found for this or any previous visit (from the past 48 hour(s)).  Blood Alcohol level:  Lab Results  Component Value Date   ETH <10 04/20/2023   ETH <10 02/15/2022    Metabolic Disorder Labs: Lab Results  Component Value Date    HGBA1C 5.8 (H) 09/02/2021   MPG 119.76 09/02/2021   MPG 111.15 09/03/2020   No results found for: "PROLACTIN" Lab Results  Component Value Date   CHOL  03/11/2009    128        ATP III CLASSIFICATION:  <200     mg/dL   Desirable  161-096  mg/dL   Borderline High  >=045    mg/dL   High          TRIG 59 03/11/2009   HDL 45 03/11/2009   CHOLHDL 2.8 03/11/2009   VLDL 12 03/11/2009   LDLCALC  03/11/2009    71        Total Cholesterol/HDL:CHD Risk Coronary Heart Disease Risk Table                     Men   Women  1/2 Average Risk   3.4   3.3  Average Risk       5.0   4.4  2 X Average Risk   9.6   7.1  3 X Average Risk  23.4   11.0        Use the calculated Patient Ratio above and the CHD Risk Table to determine the patient's CHD Risk.        ATP III CLASSIFICATION (LDL):  <100     mg/dL   Optimal  409-811  mg/dL   Near or Above                    Optimal  130-159  mg/dL   Borderline  914-782  mg/dL   High  >  190     mg/dL   Very High    Physical Findings: AIMS:  , ,  ,  ,    CIWA:    COWS:     Musculoskeletal: Strength & Muscle Tone: within normal limits Gait & Station: normal Patient leans: N/A  Psychiatric Specialty Exam:  Presentation  General Appearance:  Appropriate for Environment  Eye Contact: Fair  Speech: Slow; Clear and Coherent  Speech Volume: Decreased  Handedness: Right   Mood and Affect  Mood: Anxious; Depressed; Hopeless  Affect: Congruent; Constricted; Restricted; Tearful   Thought Process  Thought Processes: Coherent; Goal Directed  Descriptions of Associations:Intact  Orientation:Full (Time, Place and Person)  Thought Content:Abstract Reasoning  History of Schizophrenia/Schizoaffective disorder:No data recorded Duration of Psychotic Symptoms:No data recorded Hallucinations:No data recorded Ideas of Reference:None  Suicidal Thoughts:No data recorded Homicidal Thoughts:No data recorded  Sensorium   Memory: Immediate Fair; Recent Fair  Judgment: Impaired  Insight: Shallow   Executive Functions  Concentration: Fair  Attention Span: Fair  Recall: [employer] of Knowledge: Fair  Language: Fair   Psychomotor Activity  Psychomotor Activity:No data recorded  Assets  Assets: Communication Skills; Desire for Improvement; Housing   Sleep  Sleep:No data recorded    Blood pressure (!) 135/93, pulse 63, temperature 98.1 F (36.7 C), resp. rate 20, height 5\' 9"  (1.753 m), weight 92.5 kg, SpO2 96%. Body mass index is 30.13 kg/m.   Treatment Plan Summary: Daily contact with patient to assess and evaluate symptoms and progress in treatment, Medication management, and Plan increase trazodone to 200 mg at bedtime and Zoloft 200 mg in the morning.  Pink Maye Tresea Mall, DO 04/26/2023, 1:01 PM

## 2023-04-26 NOTE — Progress Notes (Signed)
Patient is a voluntary admit for depression to [facility] who is A&Ox3, cooperative with staff and pleasant with peers. Prefers to spend his day watching tv in the day room. Sits in group but doesn't really participate. Received his norco for chronic pain 2x during the shift along with prn ibuprofen c/o headache. Spoke to him briefly regarding using a wheelchair vs his home walker. He advises he is seeing ortho regarding his chronic knee pain.  Will continue to monitor.

## 2023-04-27 DIAGNOSIS — F3130 Bipolar disorder, current episode depressed, mild or moderate severity, unspecified: Secondary | ICD-10-CM | POA: Diagnosis not present

## 2023-04-27 NOTE — Group Note (Signed)

## 2023-04-27 NOTE — Progress Notes (Signed)
   04/27/23 1930  Psych Admission Type (Psych Patients Only)  Admission Status Voluntary  Psychosocial Assessment  Patient Complaints Anxiety;Depression;Irritability  Eye Contact Fair  Facial Expression Animated  Affect Anxious  Speech Logical/coherent  Interaction Assertive  Motor Activity Slow (moves around in a wheelchair)  Appearance/Hygiene In scrubs  Behavior Characteristics Cooperative;Appropriate to situation;Anxious  Mood Anxious;Depressed  Thought Process  Coherency WDL  Content WDL;Blaming others  Delusions None reported or observed  Perception WDL  Hallucination None reported or observed  Judgment Impaired  Confusion None  Danger to Self  Current suicidal ideation? Denies  Danger to Others  Danger to Others None reported or observed   Progress note   D: Pt seen in dayroom interacting with peers. Pt denies SI, HI, AVH. Pt rates pain  8/10 as chronic pain in bilateral knees. Pt endorses anxiety and depression. Pt also says that he "can't get my thoughts together. I'm not ready to go home. I need more time." Pt states that he lost his father and his sister last summer. He has 2 grown sons that he is proud of. Pt states that he gets those feelings that he is doing well and will stop taking his medications. He is okay for a couple of days and then he starts to drink again. "I know I need to stay on my medication." Pt states that his anxiety is still high. Encouraged pt to intentionally pay attention to his anxiety level throughout the day and report this to the providers so they can more accurately adjust his medications. He said that some medication he was given this morning led to him sleeping until 2 pm. "They adjusted my medications today."No other concerns noted at this time.  A: Pt provided support and encouragement. Pt given scheduled medication as prescribed. PRNs as appropriate. Q15 min checks for safety.   R: Pt safe on the unit. Will continue to monitor.

## 2023-04-27 NOTE — BH IP Treatment Plan (Signed)
Interdisciplinary Treatment and Diagnostic Plan Update  04/27/2023 Time of Session: 9:00 AM  Todd [name]. MRN: [id number]  Principal Diagnosis: Bipolar I disorder, most recent episode depressed (HCC)  Secondary Diagnoses: Principal Problem:   Bipolar I disorder, most recent episode depressed (HCC) Active Problems:   Suicidal ideations   Current Medications:  Current Facility-Administered Medications  Medication Dose Route Frequency Provider Last Rate Last Admin   alum & mag hydroxide-simeth (MAALOX/MYLANTA) 200-200-20 MG/5ML suspension 30 mL  30 mL Oral Q4H PRN Weber, Kyra A, NP       amLODipine (NORVASC) tablet 5 mg  5 mg Oral Daily Weber, Kyra A, NP   5 mg at 04/27/23 0923   busPIRone (BUSPAR) tablet 7.5 mg  7.5 mg Oral BID Lewanda Rife, MD   7.5 mg at 04/27/23 8295   diphenhydrAMINE (BENADRYL) capsule 50 mg  50 mg Oral TID PRN Phebe Colla A, NP       Or   diphenhydrAMINE (BENADRYL) injection 50 mg  50 mg Intramuscular TID PRN Weber, Bella Kennedy A, NP       divalproex (DEPAKOTE) DR tablet 500 mg  500 mg Oral BID Lewanda Rife, MD   500 mg at 04/27/23 6213   haloperidol (HALDOL) tablet 5 mg  5 mg Oral TID PRN Weber, Kyra A, NP       Or   haloperidol lactate (HALDOL) injection 5 mg  5 mg Intramuscular TID PRN Weber, Bella Kennedy A, NP       HYDROcodone-acetaminophen (NORCO) 10-325 MG per tablet 1 tablet  1 tablet Oral Q8H PRN Lewanda Rife, MD   1 tablet at 04/27/23 0735   hydrOXYzine (ATARAX) tablet 25 mg  25 mg Oral Q4H PRN Lewanda Rife, MD       ibuprofen (ADVIL) tablet 400 mg  400 mg Oral Q6H PRN Lewanda Rife, MD   400 mg at 04/26/23 2033   influenza vaccine adjuvanted (FLUAD) injection 0.5 mL  0.5 mL Intramuscular Tomorrow-1000 Sarina Ill, DO       LORazepam (ATIVAN) tablet 2 mg  2 mg Oral TID PRN Weber, Bella Kennedy A, NP       Or   LORazepam (ATIVAN) injection 2 mg  2 mg Intramuscular TID PRN Weber, Bella Kennedy A, NP       magnesium hydroxide (MILK OF MAGNESIA)  suspension 30 mL  30 mL Oral Daily PRN Weber, Kyra A, NP   30 mL at 04/24/23 1609   melatonin tablet 5 mg  5 mg Oral QHS Lewanda Rife, MD   5 mg at 04/26/23 2117   nicotine (NICODERM CQ - dosed in mg/24 hours) patch 14 mg  14 mg Transdermal Daily Lewanda Rife, MD   14 mg at 04/27/23 0926   pneumococcal 20-valent conjugate vaccine (PREVNAR 20) injection 0.5 mL  0.5 mL Intramuscular Tomorrow-1000 Sarina Ill, DO       sertraline (ZOLOFT) tablet 100 mg  100 mg Oral Daily Sarina Ill, DO   100 mg at 04/27/23 0865   traZODone (DESYREL) tablet 200 mg  200 mg Oral QHS Sarina Ill, DO   200 mg at 04/26/23 2116   PTA Medications: Medications Prior to Admission  Medication Sig Dispense Refill Last Dose   acetaminophen (TYLENOL) 500 MG tablet Take 2 tablets (1,000 mg total) by mouth every 6 (six) hours as needed for moderate pain, headache or mild pain. (Patient not taking: Reported on 02/15/2022) 30 tablet 0    amLODipine (NORVASC) 5 MG tablet Take 1 tablet (  5 mg total) by mouth daily. (Patient not taking: Reported on 04/20/2023) 30 tablet 0    HYDROcodone-acetaminophen (NORCO) 10-325 MG tablet Take 1 tablet by mouth every 6 (six) hours as needed for severe pain (knees).      hydrOXYzine (ATARAX) 50 MG tablet Take 50 mg by mouth every 6 (six) hours as needed for anxiety.      sertraline (ZOLOFT) 100 MG tablet Take 100 mg by mouth daily.      traZODone (DESYREL) 100 MG tablet Take 100 mg by mouth at bedtime.       Patient Stressors:    Patient Strengths:    Treatment Modalities: Medication Management, Group therapy, Case management,  1 to 1 session with clinician, Psychoeducation, Recreational therapy.   Physician Treatment Plan for Primary Diagnosis: Bipolar I disorder, most recent episode depressed (HCC) Long Term Goal(s): Improvement in symptoms so as ready for discharge   Short Term Goals: Ability to identify changes in lifestyle to reduce recurrence of  condition will improve Ability to verbalize feelings will improve Ability to disclose and discuss suicidal ideas Ability to demonstrate self-control will improve Ability to identify and develop effective coping behaviors will improve Ability to maintain clinical measurements within normal limits will improve Compliance with prescribed medications will improve Ability to identify triggers associated with substance abuse/mental health issues will improve  Medication Management: Evaluate patient's response, side effects, and tolerance of medication regimen.  Therapeutic Interventions: 1 to 1 sessions, Unit Group sessions and Medication administration.  Evaluation of Outcomes: Progressing  Physician Treatment Plan for Secondary Diagnosis: Principal Problem:   Bipolar I disorder, most recent episode depressed (HCC) Active Problems:   Suicidal ideations  Long Term Goal(s): Improvement in symptoms so as ready for discharge   Short Term Goals: Ability to identify changes in lifestyle to reduce recurrence of condition will improve Ability to verbalize feelings will improve Ability to disclose and discuss suicidal ideas Ability to demonstrate self-control will improve Ability to identify and develop effective coping behaviors will improve Ability to maintain clinical measurements within normal limits will improve Compliance with prescribed medications will improve Ability to identify triggers associated with substance abuse/mental health issues will improve     Medication Management: Evaluate patient's response, side effects, and tolerance of medication regimen.  Therapeutic Interventions: 1 to 1 sessions, Unit Group sessions and Medication administration.  Evaluation of Outcomes: Progressing   RN Treatment Plan for Primary Diagnosis: Bipolar I disorder, most recent episode depressed (HCC) Long Term Goal(s): Knowledge of disease and therapeutic regimen to maintain health will  improve  Short Term Goals: Ability to remain free from injury will improve, Ability to verbalize frustration and anger appropriately will improve, Ability to demonstrate self-control, Ability to participate in decision making will improve, Ability to verbalize feelings will improve, Ability to disclose and discuss suicidal ideas, Ability to identify and develop effective coping behaviors will improve, and Compliance with prescribed medications will improve  Medication Management: RN will administer medications as ordered by provider, will assess and evaluate patient's response and provide education to patient for prescribed medication. RN will report any adverse and/or side effects to prescribing provider.  Therapeutic Interventions: 1 on 1 counseling sessions, Psychoeducation, Medication administration, Evaluate responses to treatment, Monitor vital signs and CBGs as ordered, Perform/monitor CIWA, COWS, AIMS and Fall Risk screenings as ordered, Perform wound care treatments as ordered.  Evaluation of Outcomes: Progressing   LCSW Treatment Plan for Primary Diagnosis: Bipolar I disorder, most recent episode depressed (HCC) Long Term Goal(s):  Safe transition to appropriate next level of care at discharge, Engage patient in therapeutic group addressing interpersonal concerns.  Short Term Goals: Engage patient in aftercare planning with referrals and resources, Increase social support, Increase ability to appropriately verbalize feelings, Increase emotional regulation, Facilitate acceptance of mental health diagnosis and concerns, and Increase skills for wellness and recovery  Therapeutic Interventions: Assess for all discharge needs, 1 to 1 time with Social worker, Explore available resources and support systems, Assess for adequacy in community support network, Educate family and significant other(s) on suicide prevention, Complete Psychosocial Assessment, Interpersonal group therapy.  Evaluation of  Outcomes: Progressing   Progress in Treatment: Attending groups: Yes. and No. Participating in groups: Yes. and No. Taking medication as prescribed: Yes. Toleration medication: Yes. Family/Significant other contact made: No, will contact:  CSW will contact if given permission  Patient understands diagnosis: Yes. Discussing patient identified problems/goals with staff: Yes. Medical problems stabilized or resolved: Yes. Denies suicidal/homicidal ideation: Yes. Issues/concerns per patient self-inventory: No. Other: None   New problem(s) identified: No, Describe:  None identified  Update 04/27/23: No changes at this time    New Short Term/Long Term Goal(s): elimination of symptoms of psychosis, medication management for mood stabilization; elimination of SI thoughts; development of comprehensive mental wellness/sobriety plan. Update 04/27/23: No changes at this time    Patient Goals:  " I want to get back on my medication... get back on my medicine, get it in my system, be productive again" Update 04/27/23: No changes at this time    Discharge Plan or Barriers:  CSW will assist with appropriate discharge planning Update 04/27/23: No changes at this time    Reason for Continuation of Hospitalization: Depression Medication stabilization   Estimated Length of Stay: 1 to 7 daysUpdate 04/27/23: No changes at this time   Last 3 [place] Suicide Severity Risk Score: Flowsheet Row Admission (Current) from 04/20/2023 in [facility] BEHAVIORAL MEDICINE Most recent reading at 04/20/2023 11:11 PM ED from 04/20/2023 in [facility] Emergency Department at [facility] Most recent reading at 04/20/2023 10:40 AM ED from 02/15/2022 in [facility] Emergency Department at [facility] Most recent reading at 02/15/2022 12:08 PM  C-SSRS RISK CATEGORY No Risk No Risk High Risk       Last PHQ 2/9 Scores:     No data to display          Scribe for Treatment Team: Elza Rafter,  Theresia Majors 04/27/2023 10:16 AM

## 2023-04-27 NOTE — Group Note (Unsigned)
Date:  04/28/2023 Time:  12:22 AM  Group Topic/Focus:  Healthy Communication:   The focus of this group is to discuss communication, barriers to communication, as well as healthy ways to communicate with others.    Participation Level:  Active  Participation Quality:  Appropriate  Affect:  Appropriate  Cognitive:  Appropriate  Insight: Improving  Engagement in Group:  Improving  Modes of Intervention:  Discussion  Additional Comments:    Maeola Harman 04/28/2023, 12:22 AM

## 2023-04-27 NOTE — Progress Notes (Signed)
   04/27/23 1200  Psych Admission Type (Psych Patients Only)  Admission Status Voluntary  Psychosocial Assessment  Patient Complaints Depression  Eye Contact Brief  Facial Expression Flat  Affect Flat  Speech Logical/coherent  Interaction Minimal  Motor Activity Slow  Appearance/Hygiene In scrubs  Behavior Characteristics Cooperative  Mood Pleasant  Thought Process  Coherency WDL  Content WDL  Delusions None reported or observed  Perception WDL  Hallucination None reported or observed  Judgment Impaired  Confusion None  Danger to Self  Current suicidal ideation? Denies  Danger to Others  Danger to Others None reported or observed   Patient tolerated all meals and medications. Medicated for pain as ordered. Denies SI/HI/AVH

## 2023-04-27 NOTE — Progress Notes (Signed)
   04/27/23 0603  15 Minute Checks  Location Bedroom  Visual Appearance Calm  Behavior Sleeping  Sleep (Behavioral Health Patients Only)  Calculate sleep? (Click Yes once per 24 hr at 0600 safety check) Yes  Documented sleep last 24 hours 9.75

## 2023-04-27 NOTE — Progress Notes (Signed)
[facility] MD Progress Note  04/27/2023 1:40 PM Emmanuelle Stacey Drain.  MRN:  [id number] Subjective: Todd [name] is seen on rounds.  I went up on his Zoloft yesterday and he states that he is doing fine.  His knees are hurting so he has been lying down.  He currently resides in a boardinghouse in [place].  Nurses report no issues with him.  He has no complaints.  No side effects from his medications.  He says that he is not ready for discharge and is afraid that he might do something stupid but would not qualify. Principal Problem: Bipolar I disorder, most recent episode depressed (HCC) Diagnosis: Principal Problem:   Bipolar I disorder, most recent episode depressed (HCC) Active Problems:   Suicidal ideations  Total Time spent with patient: 15 minutes  Past Psychiatric History: Depression  Past Medical History:  Past Medical History:  Diagnosis Date   Arthritis    "fingers" (07/14/2018)   Depression    DVT (deep venous thrombosis) (HCC) LLE   Hepatitis C     finished harvoni tx ~ 2017   Hypercholesterolemia    Hypertension    Prostate cancer (HCC) 21yrs ago   Pulmonary embolism and infarction (HCC) 07/13/2018   Sleep apnea    not currently using cpap, mask causing vertigo   Type II diabetes mellitus (HCC)     Past Surgical History:  Procedure Laterality Date   BIOPSY  01/12/2019   Procedure: BIOPSY;  Surgeon: Bernette Redbird, MD;  Location: [facility] ENDOSCOPY;  Service: Endoscopy;;   BIOPSY  06/23/2019   Procedure: BIOPSY;  Surgeon: Willis Modena, MD;  Location: [facility] ENDOSCOPY;  Service: Endoscopy;;   COLONOSCOPY WITH PROPOFOL N/A 02/19/2014   Procedure: COLONOSCOPY WITH PROPOFOL;  Surgeon: Charolett Bumpers, MD;  Location: [facility] ENDOSCOPY;  Service: Endoscopy;  Laterality: N/A;   ENDOVENOUS ABLATION SAPHENOUS VEIN W/ LASER Left 11/22/2017   endovenous laser ablation L SSV by Josephina Gip MD    ESOPHAGEAL BRUSHING  06/23/2019   Procedure: ESOPHAGEAL BRUSHING;  Surgeon: Willis Modena, MD;  Location: [facility]  ENDOSCOPY;  Service: Endoscopy;;   ESOPHAGOGASTRODUODENOSCOPY (EGD) WITH PROPOFOL N/A 01/12/2019   Procedure: ESOPHAGOGASTRODUODENOSCOPY (EGD) WITH PROPOFOL;  Surgeon: Bernette Redbird, MD;  Location: [facility] ENDOSCOPY;  Service: Endoscopy;  Laterality: N/A;  Patient is also scheduled for barium swallow; please notify radiology after patient's EGD is complete so that barium swallow follows the endoscopy, not vice versa   ESOPHAGOGASTRODUODENOSCOPY (EGD) WITH PROPOFOL N/A 06/23/2019   Procedure: ESOPHAGOGASTRODUODENOSCOPY (EGD) WITH PROPOFOL;  Surgeon: Willis Modena, MD;  Location[facility] ENDOSCOPY;  Service: Endoscopy;  Laterality: N/A;   PROSTATECTOMY  2008   REPAIR QUADRICEPS / HAMSTRING MUSCLE Right    Family History:  Family History  Problem Relation Age of Onset   Cancer Father        PROSTATE   Family Psychiatric  History: Unremarkable Social History:  Social History   Substance and Sexual Activity  Alcohol Use Yes     Social History   Substance and Sexual Activity  Drug Use Yes    Social History   Socioeconomic History   Marital status: Single    Spouse name: Not on file   Number of children: 2   Years of education: 12th   Highest education level: Not on file  Occupational History   Occupation: post office  Tobacco Use   Smoking status: Every Day    Current packs/day: 0.12    Average packs/day: 0.1 packs/day for 45.0 years (5.4 ttl pk-yrs)  Types: Cigarettes   Smokeless tobacco: Never  Vaping Use   Vaping status: Never Used  Substance and Sexual Activity   Alcohol use: Yes   Drug use: Yes   Sexual activity: Not Currently  Other Topics Concern   Not on file  Social History Narrative   ** Merged History Encounter **       Patient lives at home alone.Marland KitchenMarland KitchenDrinks coffee daily    Social Determinants of Health   Financial Resource Strain: Low Risk  (10/27/2022)   Received from [facility]   Overall Financial Resource Strain (CARDIA)    Difficulty of Paying Living  Expenses: Not very hard  Food Insecurity: No Food Insecurity (04/20/2023)   Hunger Vital Sign    Worried About Running Out of Food in the Last Year: Never true    Ran Out of Food in the Last Year: Never true  Transportation Needs: No Transportation Needs (04/20/2023)   PRAPARE - Administrator, Civil Service (Medical): No    Lack of Transportation (Non-Medical): No  Physical Activity: Inactive (10/27/2022)   Received from [facility]   Exercise Vital Sign    Days of Exercise per Week: 0 days    Minutes of Exercise per Session: 0 min  Stress: Stress Concern Present (10/27/2022)   Received from [facility] - Occupational Stress Questionnaire    Feeling of Stress : To some extent  Social Connections: Socially Isolated (10/27/2022)   Received from [facility]   Social Connection and Isolation Panel [NHANES]    Frequency of Communication with Friends and Family: More than three times a week    Frequency of Social Gatherings with Friends and Family: More than three times a week    Attends Religious Services: Never    Database administrator or Organizations: No    Attends Engineer, structural: Never    Marital Status: Divorced   Additional Social History:                         Sleep: Good  Appetite:  Good  Current Medications: Current Facility-Administered Medications  Medication Dose Route Frequency Provider Last Rate Last Admin   alum & mag hydroxide-simeth (MAALOX/MYLANTA) 200-200-20 MG/5ML suspension 30 mL  30 mL Oral Q4H PRN Weber, Kyra A, NP       amLODipine (NORVASC) tablet 5 mg  5 mg Oral Daily Weber, Kyra A, NP   5 mg at 04/27/23 0923   busPIRone (BUSPAR) tablet 7.5 mg  7.5 mg Oral BID Lewanda Rife, MD   7.5 mg at 04/27/23 1610   diphenhydrAMINE (BENADRYL) capsule 50 mg  50 mg Oral TID PRN Weber, Bella Kennedy A, NP       Or   diphenhydrAMINE (BENADRYL) injection 50 mg  50 mg Intramuscular TID PRN  Weber, Kyra A, NP       divalproex (DEPAKOTE) DR tablet 500 mg  500 mg Oral BID Lewanda Rife, MD   500 mg at 04/27/23 9604   haloperidol (HALDOL) tablet 5 mg  5 mg Oral TID PRN Weber, Kyra A, NP       Or   haloperidol lactate (HALDOL) injection 5 mg  5 mg Intramuscular TID PRN Weber, Kyra A, NP       HYDROcodone-acetaminophen (NORCO) 10-325 MG per tablet 1 tablet  1 tablet Oral Q8H PRN Lewanda Rife, MD   1 tablet at 04/27/23 [phone]  hydrOXYzine (ATARAX) tablet 25 mg  25 mg Oral Q4H PRN Lewanda Rife, MD       ibuprofen (ADVIL) tablet 400 mg  400 mg Oral Q6H PRN Lewanda Rife, MD   400 mg at 04/26/23 2033   influenza vaccine adjuvanted (FLUAD) injection 0.5 mL  0.5 mL Intramuscular Tomorrow-1000 Sarina Ill, DO       LORazepam (ATIVAN) tablet 2 mg  2 mg Oral TID PRN Weber, Bella Kennedy A, NP       Or   LORazepam (ATIVAN) injection 2 mg  2 mg Intramuscular TID PRN Weber, Bella Kennedy A, NP       magnesium hydroxide (MILK OF MAGNESIA) suspension 30 mL  30 mL Oral Daily PRN Weber, Kyra A, NP   30 mL at 04/24/23 1609   melatonin tablet 5 mg  5 mg Oral QHS Lewanda Rife, MD   5 mg at 04/26/23 2117   nicotine (NICODERM CQ - dosed in mg/24 hours) patch 14 mg  14 mg Transdermal Daily Lewanda Rife, MD   14 mg at 04/27/23 0926   pneumococcal 20-valent conjugate vaccine (PREVNAR 20) injection 0.5 mL  0.5 mL Intramuscular Tomorrow-1000 Sarina Ill, DO       sertraline (ZOLOFT) tablet 100 mg  100 mg Oral Daily Sarina Ill, DO   100 mg at 04/27/23 8657   traZODone (DESYREL) tablet 200 mg  200 mg Oral QHS Sarina Ill, DO   200 mg at 04/26/23 2116    Lab Results: No results found for this or any previous visit (from the past 48 hour(s)).  Blood Alcohol level:  Lab Results  Component Value Date   ETH <10 04/20/2023   ETH <10 02/15/2022    Metabolic Disorder Labs: Lab Results  Component Value Date   HGBA1C 5.8 (H) 09/02/2021   MPG 119.76  09/02/2021   MPG 111.15 09/03/2020   No results found for: "PROLACTIN" Lab Results  Component Value Date   CHOL  03/11/2009    128        ATP III CLASSIFICATION:  <200     mg/dL   Desirable  846-962  mg/dL   Borderline High  >=952    mg/dL   High          TRIG 59 03/11/2009   HDL 45 03/11/2009   CHOLHDL 2.8 03/11/2009   VLDL 12 03/11/2009   LDLCALC  03/11/2009    71        Total Cholesterol/HDL:CHD Risk Coronary Heart Disease Risk Table                     Men   Women  1/2 Average Risk   3.4   3.3  Average Risk       5.0   4.4  2 X Average Risk   9.6   7.1  3 X Average Risk  23.4   11.0        Use the calculated Patient Ratio above and the CHD Risk Table to determine the patient's CHD Risk.        ATP III CLASSIFICATION (LDL):  <100     mg/dL   Optimal  841-324  mg/dL   Near or Above                    Optimal  130-159  mg/dL   Borderline  401-027  mg/dL   High  >253     mg/dL   Very High  Physical Findings: AIMS:  , ,  ,  ,    CIWA:    COWS:     Musculoskeletal: Strength & Muscle Tone: within normal limits Gait & Station: normal Patient leans: N/A  Psychiatric Specialty Exam:  Presentation  General Appearance:  Appropriate for Environment  Eye Contact: Fair  Speech: Slow; Clear and Coherent  Speech Volume: Decreased  Handedness: Right   Mood and Affect  Mood: Anxious; Depressed; Hopeless  Affect: Congruent; Constricted; Restricted; Tearful   Thought Process  Thought Processes: Coherent; Goal Directed  Descriptions of Associations:Intact  Orientation:Full (Time, Place and Person)  Thought Content:Abstract Reasoning  History of Schizophrenia/Schizoaffective disorder:No data recorded Duration of Psychotic Symptoms:No data recorded Hallucinations:No data recorded Ideas of Reference:None  Suicidal Thoughts:No data recorded Homicidal Thoughts:No data recorded  Sensorium  Memory: Immediate Fair; Recent  Fair  Judgment: Impaired  Insight: Shallow   Executive Functions  Concentration: Fair  Attention Span: Fair  Recall: [employer] of Knowledge: Fair  Language: Fair   Psychomotor Activity  Psychomotor Activity:No data recorded  Assets  Assets: Communication Skills; Desire for Improvement; Housing   Sleep  Sleep:No data recorded    Blood pressure 135/88, pulse 75, temperature 97.9 F (36.6 C), resp. rate 16, height 5\' 9"  (1.753 m), weight 92.5 kg, SpO2 93%. Body mass index is 30.13 kg/m.   Treatment Plan Summary: Daily contact with patient to assess and evaluate symptoms and progress in treatment, Medication management, and Plan continue current medications.  Iretta Mangrum Tresea Mall, DO 04/27/2023, 1:40 PM

## 2023-04-27 NOTE — Plan of Care (Signed)
  Problem: Health Behavior/Discharge Planning: Goal: Ability to manage health-related needs will improve Outcome: Progressing   Problem: Clinical Measurements: Goal: Ability to maintain clinical measurements within normal limits will improve Outcome: Progressing Goal: Will remain free from infection Outcome: Progressing   Problem: Activity: Goal: Risk for activity intolerance will decrease Outcome: Progressing   Problem: Nutrition: Goal: Adequate nutrition will be maintained Outcome: Progressing   Problem: Pain Managment: Goal: General experience of comfort will improve Outcome: Progressing   Problem: Safety: Goal: Ability to remain free from injury will improve Outcome: Progressing   Problem: Activity: Goal: Sleeping patterns will improve Outcome: Progressing   Problem: Coping: Goal: Ability to demonstrate self-control will improve Outcome: Progressing

## 2023-04-27 NOTE — Group Note (Signed)
Recreation Therapy Group Note   Group Topic:Other  Group Date: 04/27/2023 Start Time: 1400 End Time: 1455 Facilitators: Rosina Lowenstein, LRT, CTRS Location:  [facility]   Group Description: Bingo. LRT and patients played multiple games of Bingo with music playing in the background. LRT and pts discussed how this could be a leisure interest and the importance of doing things they enjoy post-discharge.   Goal Area(s) Addressed: Patient will identify leisure interests.  Patient will practice healthy decision making. Patient will engage in recreation activity.    Affect/Mood: Appropriate   Participation Level: Moderate   Participation Quality: Independent   Behavior: Appropriate and Calm   Speech/Thought Process: Coherent   Insight: Fair   Judgement: Fair    Modes of Intervention: Activity   Patient Response to Interventions:  Receptive   Education Outcome:  In group clarification offered    Clinical Observations/Individualized Feedback: Mas was somewhat active in their participation of session activities and group discussion. Pt did not play bingo with peers, he said he was going to watch and that "it's nice to get out of the house" in reference to being out of his room. Pt joined the group outside after watching peers play games of bingo. Pt interacted well with LRT and peers duration of session.   Plan: Continue to engage patient in RT group sessions 2-3x/week.   Rosina Lowenstein, LRT, CTRS 04/27/2023 3:09 PM

## 2023-04-28 DIAGNOSIS — F3130 Bipolar disorder, current episode depressed, mild or moderate severity, unspecified: Secondary | ICD-10-CM | POA: Diagnosis not present

## 2023-04-28 MED ORDER — BUSPIRONE HCL 5 MG PO TABS
10.0000 mg | ORAL_TABLET | Freq: Two times a day (BID) | ORAL | Status: DC
  Administered 2023-04-28 – 2023-05-02 (×8): 10 mg via ORAL
  Filled 2023-04-28 (×8): qty 2

## 2023-04-28 NOTE — Group Note (Signed)
Date:  04/28/2023 Time:  9:14 PM  Group Topic/Focus:  Identifying Needs:   The focus of this group is to help patients identify their personal needs that have been historically problematic and identify healthy behaviors to address their needs.    Participation Level:  Active  Participation Quality:  Appropriate  Affect:  Appropriate  Cognitive:  Appropriate  Insight: Appropriate  Engagement in Group:  Engaged  Modes of Intervention:  Education  Additional Comments:    Garry Heater 04/28/2023, 9:14 PM

## 2023-04-28 NOTE — Progress Notes (Signed)
[facility] MD Progress Note  04/28/2023 11:28 AM Todd [name].  MRN:  [id number] Subjective: Todd [name] is seen on rounds.  Nurses report he had a good night and slept throughout the night.  He is tolerating his medications without any side effects.  He is complaining of anxiety so I told him we will go up on his BuSpar.  We went up on his Zoloft couple days ago.  He is pleasant and cooperative and interacting well with staff and peers. Principal Problem: Bipolar I disorder, most recent episode depressed (HCC) Diagnosis: Principal Problem:   Bipolar I disorder, most recent episode depressed (HCC) Active Problems:   Suicidal ideations  Total Time spent with patient: 15 minutes  Past Psychiatric History: Bipolar depression  Past Medical History:  Past Medical History:  Diagnosis Date   Arthritis    "fingers" (07/14/2018)   Depression    DVT (deep venous thrombosis) (HCC) LLE   Hepatitis C     finished harvoni tx ~ 2017   Hypercholesterolemia    Hypertension    Prostate cancer (HCC) 34yrs ago   Pulmonary embolism and infarction (HCC) 07/13/2018   Sleep apnea    not currently using cpap, mask causing vertigo   Type II diabetes mellitus (HCC)     Past Surgical History:  Procedure Laterality Date   BIOPSY  01/12/2019   Procedure: BIOPSY;  Surgeon: Bernette Redbird, MD;  Location: [facility] ENDOSCOPY;  Service: Endoscopy;;   BIOPSY  06/23/2019   Procedure: BIOPSY;  Surgeon: Willis Modena, MD;  Location: [facility] ENDOSCOPY;  Service: Endoscopy;;   COLONOSCOPY WITH PROPOFOL N/A 02/19/2014   Procedure: COLONOSCOPY WITH PROPOFOL;  Surgeon: Charolett Bumpers, MD;  Location: [facility] ENDOSCOPY;  Service: Endoscopy;  Laterality: N/A;   ENDOVENOUS ABLATION SAPHENOUS VEIN W/ LASER Left 11/22/2017   endovenous laser ablation L SSV by Josephina Gip MD    ESOPHAGEAL BRUSHING  06/23/2019   Procedure: ESOPHAGEAL BRUSHING;  Surgeon: Willis Modena, MD;  Location: [facility] ENDOSCOPY;  Service: Endoscopy;;   ESOPHAGOGASTRODUODENOSCOPY  (EGD) WITH PROPOFOL N/A 01/12/2019   Procedure: ESOPHAGOGASTRODUODENOSCOPY (EGD) WITH PROPOFOL;  Surgeon: Bernette Redbird, MD;  Location: [facility] ENDOSCOPY;  Service: Endoscopy;  Laterality: N/A;  Patient is also scheduled for barium swallow; please notify radiology after patient's EGD is complete so that barium swallow follows the endoscopy, not vice versa   ESOPHAGOGASTRODUODENOSCOPY (EGD) WITH PROPOFOL N/A 06/23/2019   Procedure: ESOPHAGOGASTRODUODENOSCOPY (EGD) WITH PROPOFOL;  Surgeon: Willis Modena, MD;  Location: [facility] ENDOSCOPY;  Service: Endoscopy;  Laterality: N/A;   PROSTATECTOMY  2008   REPAIR QUADRICEPS / HAMSTRING MUSCLE Right    Family History:  Family History  Problem Relation Age of Onset   Cancer Father        PROSTATE   Family Psychiatric  History: Unremarkable Social History:  Social History   Substance and Sexual Activity  Alcohol Use Yes     Social History   Substance and Sexual Activity  Drug Use Yes    Social History   Socioeconomic History   Marital status: Single    Spouse name: Not on file   Number of children: 2   Years of education: 12th   Highest education level: Not on file  Occupational History   Occupation: post office  Tobacco Use   Smoking status: Every Day    Current packs/day: 0.12    Average packs/day: 0.1 packs/day for 45.0 years (5.4 ttl pk-yrs)    Types: Cigarettes   Smokeless tobacco: Never  Vaping Use   Vaping  status: Never Used  Substance and Sexual Activity   Alcohol use: Yes   Drug use: Yes   Sexual activity: Not Currently  Other Topics Concern   Not on file  Social History Narrative   ** Merged History Encounter **       Patient lives at home alone.Marland KitchenMarland KitchenDrinks coffee daily    Social Determinants of Health   Financial Resource Strain: Low Risk  (10/27/2022)   Received from [facility]   Overall Financial Resource Strain (CARDIA)    Difficulty of Paying Living Expenses: Not very hard  Food Insecurity: No Food Insecurity  (04/20/2023)   Hunger Vital Sign    Worried About Running Out of Food in the Last Year: Never true    Ran Out of Food in the Last Year: Never true  Transportation Needs: No Transportation Needs (04/20/2023)   PRAPARE - Administrator, Civil Service (Medical): No    Lack of Transportation (Non-Medical): No  Physical Activity: Inactive (10/27/2022)   Received from [facility]   Exercise Vital Sign    Days of Exercise per Week: 0 days    Minutes of Exercise per Session: 0 min  Stress: Stress Concern Present (10/27/2022)   Received from [facility] - Occupational Stress Questionnaire    Feeling of Stress : To some extent  Social Connections: Socially Isolated (10/27/2022)   Received from [facility]   Social Connection and Isolation Panel [NHANES]    Frequency of Communication with Friends and Family: More than three times a week    Frequency of Social Gatherings with Friends and Family: More than three times a week    Attends Religious Services: Never    Database administrator or Organizations: No    Attends Engineer, structural: Never    Marital Status: Divorced   Additional Social History:                         Sleep: Good  Appetite:  Good  Current Medications: Current Facility-Administered Medications  Medication Dose Route Frequency Provider Last Rate Last Admin   alum & mag hydroxide-simeth (MAALOX/MYLANTA) 200-200-20 MG/5ML suspension 30 mL  30 mL Oral Q4H PRN Weber, Kyra A, NP       amLODipine (NORVASC) tablet 5 mg  5 mg Oral Daily Weber, Kyra A, NP   5 mg at 04/28/23 0907   busPIRone (BUSPAR) tablet 10 mg  10 mg Oral BID Sarina Ill, DO       diphenhydrAMINE (BENADRYL) capsule 50 mg  50 mg Oral TID PRN Weber, Kyra A, NP       Or   diphenhydrAMINE (BENADRYL) injection 50 mg  50 mg Intramuscular TID PRN Weber, Kyra A, NP       divalproex (DEPAKOTE) DR tablet 500 mg  500 mg Oral  BID Lewanda Rife, MD   500 mg at 04/28/23 0905   haloperidol (HALDOL) tablet 5 mg  5 mg Oral TID PRN Weber, Kyra A, NP       Or   haloperidol lactate (HALDOL) injection 5 mg  5 mg Intramuscular TID PRN Weber, Kyra A, NP       HYDROcodone-acetaminophen (NORCO) 10-325 MG per tablet 1 tablet  1 tablet Oral Q8H PRN Lewanda Rife, MD   1 tablet at 04/28/23 0655   hydrOXYzine (ATARAX) tablet 25 mg  25 mg Oral Q4H PRN Lewanda Rife,  MD       ibuprofen (ADVIL) tablet 400 mg  400 mg Oral Q6H PRN Lewanda Rife, MD   400 mg at 04/28/23 0917   influenza vaccine adjuvanted (FLUAD) injection 0.5 mL  0.5 mL Intramuscular Tomorrow-1000 Sarina Ill, DO       LORazepam (ATIVAN) tablet 2 mg  2 mg Oral TID PRN Weber, Bella Kennedy A, NP       Or   LORazepam (ATIVAN) injection 2 mg  2 mg Intramuscular TID PRN Weber, Kyra A, NP       magnesium hydroxide (MILK OF MAGNESIA) suspension 30 mL  30 mL Oral Daily PRN Weber, Kyra A, NP   30 mL at 04/24/23 1609   melatonin tablet 5 mg  5 mg Oral QHS Lewanda Rife, MD   5 mg at 04/27/23 2133   nicotine (NICODERM CQ - dosed in mg/24 hours) patch 14 mg  14 mg Transdermal Daily Lewanda Rife, MD   14 mg at 04/28/23 0907   pneumococcal 20-valent conjugate vaccine (PREVNAR 20) injection 0.5 mL  0.5 mL Intramuscular Tomorrow-1000 Sarina Ill, DO       sertraline (ZOLOFT) tablet 100 mg  100 mg Oral Daily Sarina Ill, DO   100 mg at 04/28/23 0906   traZODone (DESYREL) tablet 200 mg  200 mg Oral QHS Sarina Ill, DO   200 mg at 04/27/23 2132    Lab Results: No results found for this or any previous visit (from the past 48 hour(s)).  Blood Alcohol level:  Lab Results  Component Value Date   ETH <10 04/20/2023   ETH <10 02/15/2022    Metabolic Disorder Labs: Lab Results  Component Value Date   HGBA1C 5.8 (H) 09/02/2021   MPG 119.76 09/02/2021   MPG 111.15 09/03/2020   No results found for: "PROLACTIN" Lab  Results  Component Value Date   CHOL  03/11/2009    128        ATP III CLASSIFICATION:  <200     mg/dL   Desirable  831-517  mg/dL   Borderline High  >=616    mg/dL   High          TRIG 59 03/11/2009   HDL 45 03/11/2009   CHOLHDL 2.8 03/11/2009   VLDL 12 03/11/2009   LDLCALC  03/11/2009    71        Total Cholesterol/HDL:CHD Risk Coronary Heart Disease Risk Table                     Men   Women  1/2 Average Risk   3.4   3.3  Average Risk       5.0   4.4  2 X Average Risk   9.6   7.1  3 X Average Risk  23.4   11.0        Use the calculated Patient Ratio above and the CHD Risk Table to determine the patient's CHD Risk.        ATP III CLASSIFICATION (LDL):  <100     mg/dL   Optimal  073-710  mg/dL   Near or Above                    Optimal  130-159  mg/dL   Borderline  626-948  mg/dL   High  >546     mg/dL   Very High    Physical Findings: AIMS:  , ,  ,  ,  CIWA:    COWS:     Musculoskeletal: Strength & Muscle Tone: within normal limits Gait & Station: normal Patient leans: N/A  Psychiatric Specialty Exam:  Presentation  General Appearance:  Appropriate for Environment  Eye Contact: Fair  Speech: Slow; Clear and Coherent  Speech Volume: Decreased  Handedness: Right   Mood and Affect  Mood: Anxious; Depressed; Hopeless  Affect: Congruent; Constricted; Restricted; Tearful   Thought Process  Thought Processes: Coherent; Goal Directed  Descriptions of Associations:Intact  Orientation:Full (Time, Place and Person)  Thought Content:Abstract Reasoning  History of Schizophrenia/Schizoaffective disorder:No data recorded Duration of Psychotic Symptoms:No data recorded Hallucinations:No data recorded Ideas of Reference:None  Suicidal Thoughts:No data recorded Homicidal Thoughts:No data recorded  Sensorium  Memory: Immediate Fair; Recent Fair  Judgment: Impaired  Insight: Shallow   Executive Functions   Concentration: Fair  Attention Span: Fair  Recall: [employer] of Knowledge: Fair  Language: Fair   Psychomotor Activity  Psychomotor Activity:No data recorded  Assets  Assets: Communication Skills; Desire for Improvement; Housing   Sleep  Sleep:No data recorded    Blood pressure 116/83, pulse 60, temperature 98.5 F (36.9 C), resp. rate 18, height 5\' 9"  (1.753 m), weight 92.5 kg, SpO2 95%. Body mass index is 30.13 kg/m.   Treatment Plan Summary: Daily contact with patient to assess and evaluate symptoms and progress in treatment, Medication management, and Plan increase BuSpar to 10 mg twice a day.  Consider Lamictal.  Sarina Ill, DO 04/28/2023, 11:28 AM

## 2023-04-28 NOTE — Progress Notes (Signed)
   04/28/23 0556  15 Minute Checks  Location Bedroom  Visual Appearance Calm  Behavior Sleeping  Sleep (Behavioral Health Patients Only)  Calculate sleep? (Click Yes once per 24 hr at 0600 safety check) Yes  Documented sleep last 24 hours 10.5

## 2023-04-28 NOTE — Progress Notes (Signed)
   04/28/23 1700  Psych Admission Type (Psych Patients Only)  Admission Status Voluntary  Psychosocial Assessment  Patient Complaints Anxiety;Depression;Irritability  Eye Contact Fair  Facial Expression Animated  Affect Anxious  Speech Logical/coherent  Interaction Assertive  Motor Activity Slow  Appearance/Hygiene In scrubs  Behavior Characteristics Cooperative;Appropriate to situation  Mood Anxious;Depressed  Thought Process  Coherency WDL  Content WDL  Delusions None reported or observed  Perception Depersonalization  Hallucination None reported or observed  Judgment Impaired  Confusion None  Danger to Self  Current suicidal ideation? Denies  Danger to Others  Danger to Others None reported or observed

## 2023-04-28 NOTE — Group Note (Signed)
Recreation Therapy Group Note   Group Topic:Leisure Education  Group Date: 04/28/2023 Start Time: 1400 End Time: 1450 Facilitators: Rosina Lowenstein, LRT, CTRS Location: Courtyard  Group Description: Leisure. Patients were given the opportunity to play ring toss, play corn hole, or listen to music while sitting in the courtyard getting fresh air and sunlight. Pt identified and conversated about things they enjoy doing in their free time and how they can continue to do that outside of the hospital.   Goal Area(s) Addressed: Patient will learn the definition of "leisure". Patient will practice making a positive decision. Patient will have the opportunity to try a new leisure activity. Patient will communicate with peers and LRT.   Affect/Mood: N/A   Participation Level: Did not attend    Clinical Observations/Individualized Feedback: [name] did not attend group.  Plan: Continue to engage patient in RT group sessions 2-3x/week.   Rosina Lowenstein, LRT, CTRS 04/28/2023 3:01 PM

## 2023-04-28 NOTE — Plan of Care (Signed)
  Problem: Health Behavior/Discharge Planning: Goal: Ability to manage health-related needs will improve Outcome: Progressing   Problem: Clinical Measurements: Goal: Will remain free from infection Outcome: Progressing   Problem: Nutrition: Goal: Adequate nutrition will be maintained Outcome: Progressing   Problem: Coping: Goal: Level of anxiety will decrease Outcome: Progressing   Problem: Safety: Goal: Ability to remain free from injury will improve Outcome: Progressing   Problem: Skin Integrity: Goal: Risk for impaired skin integrity will decrease Outcome: Progressing   Problem: Education: Goal: Emotional status will improve Outcome: Progressing Goal: Mental status will improve Outcome: Progressing   Problem: Activity: Goal: Interest or engagement in activities will improve Outcome: Progressing Goal: Sleeping patterns will improve Outcome: Progressing   Problem: Coping: Goal: Ability to verbalize frustrations and anger appropriately will improve Outcome: Progressing

## 2023-04-29 DIAGNOSIS — F3130 Bipolar disorder, current episode depressed, mild or moderate severity, unspecified: Secondary | ICD-10-CM | POA: Diagnosis not present

## 2023-04-29 LAB — GLUCOSE, CAPILLARY: Glucose-Capillary: 126 mg/dL — ABNORMAL HIGH (ref 70–99)

## 2023-04-29 MED ORDER — SERTRALINE HCL 50 MG PO TABS
150.0000 mg | ORAL_TABLET | Freq: Every day | ORAL | Status: DC
  Administered 2023-04-30 – 2023-05-02 (×3): 150 mg via ORAL
  Filled 2023-04-29 (×3): qty 3

## 2023-04-29 NOTE — Group Note (Signed)
Date:  04/29/2023 Time:  8:55 PM  Group Topic/Focus:  Healthy Communication:   The focus of this group is to discuss communication, barriers to communication, as well as healthy ways to communicate with others.    Participation Level:  Active  Participation Quality:  Appropriate  Affect:  Appropriate  Cognitive:  Appropriate  Insight: Appropriate  Engagement in Group:  Engaged  Modes of Intervention:  Education  Additional Comments:    Garry Heater 04/29/2023, 8:55 PM

## 2023-04-29 NOTE — Group Note (Signed)
Recreation Therapy Group Note   Group Topic:Coping Skills  Group Date: 04/29/2023 Start Time: 1330 End Time: 1415 Facilitators: Rosina Lowenstein, LRT, CTRS Location: Courtyard  Group Description: Music Reminisce. LRT encouraged patients to think of their favorite song(s) that reminded them of a positive memory or time in their life. LRT encouraged patient to talk about that memory aloud to the group. LRT played the song through a speaker for all to hear. LRT and patients discussed how thinking of a positive memory or time in their life can be used as a coping skill in everyday life post discharge.   Goal Area(s) Addressed: Patient will increase verbal communication by conversing with peers. Patient will contribute to group discussion with minimal prompting. Patient will reminisce a positive memory or moment in their life.    Affect/Mood: N/A   Participation Level: Did not attend    Clinical Observations/Individualized Feedback: Todd [name] did not attend group.  Plan: Continue to engage patient in RT group sessions 2-3x/week.   Rosina Lowenstein, LRT, CTRS 04/29/2023 2:36 PM

## 2023-04-29 NOTE — Progress Notes (Signed)
D- Patient alert and oriented. Flat affect. Endorses anxiety. c/o knee pain. Pain scale 10/10. Denies SI, HI, AVH. A- Scheduled medications administered to patient, per MD orders. Norco 1 tab given as ordered PRN at 2130. Support and encouragement provided.  Routine safety checks conducted every 15 minutes.  Patient informed to notify staff with problems or concerns. R- No adverse drug reactions noted. Patient contracts for safety at this time. Patient compliant with medications and treatment plan. Patient receptive, calm, and cooperative. Patient interacts well with others on the unit.  Patient remains safe at this time.

## 2023-04-29 NOTE — Plan of Care (Signed)

## 2023-04-29 NOTE — Group Note (Signed)
LCSW Group Therapy Note  Group Date: 04/29/2023 Start Time: 1400 End Time: 1445   Type of Therapy and Topic:  Group Therapy - Healthy vs Unhealthy Coping Skills  Participation Level:  Did Not Attend   Description of Group The focus of this group was to determine what unhealthy coping techniques typically are used by group members and what healthy coping techniques would be helpful in coping with various problems. Patients were guided in becoming aware of the differences between healthy and unhealthy coping techniques. Patients were asked to identify 2-3 healthy coping skills they would like to learn to use more effectively.  Therapeutic Goals Patients learned that coping is what human beings do all day long to deal with various situations in their lives Patients defined and discussed healthy vs unhealthy coping techniques Patients identified their preferred coping techniques and identified whether these were healthy or unhealthy Patients determined 2-3 healthy coping skills they would like to become more familiar with and use more often. Patients provided support and ideas to each other   Summary of Patient Progress: X  Therapeutic Modalities Cognitive Behavioral Therapy Motivational Interviewing  Elza Rafter, [place] 04/29/2023  3:12 PM

## 2023-04-29 NOTE — Progress Notes (Signed)
Patient is a voluntary admission for Depression and anxiety who ambulates in a wheelchair d/t not being allowed to use his rollator here at the hospital. Dolores Lory norco for his chronic knee pain. Discharge back to his home planned on Monday with patient being aware. Denies H/I, S/I, AVH, endorses anxiety and depression. Took his medication with no issues. Is currently napping in his room and declined snack or to participate in therapy.  Will continue to monitor.

## 2023-04-29 NOTE — Progress Notes (Signed)
[facility] MD Progress Note  04/29/2023 1:59 PM Todd [name].  MRN:  [id number] Subjective: Todd [name] is seen on rounds.  He states that he is depressed and homeless and not ready to go.  He is hopeless and helpless.  He continues to ask for his medications to be addressed.  He has been tolerating his medicines so far so I told him I can go up on his Zoloft. Principal Problem: Bipolar I disorder, most recent episode depressed (HCC) Diagnosis: Principal Problem:   Bipolar I disorder, most recent episode depressed (HCC) Active Problems:   Suicidal ideations  Total Time spent with patient: 15 minutes  Past Psychiatric History: Homeless and a history of bipolar depression  Past Medical History:  Past Medical History:  Diagnosis Date   Arthritis    "fingers" (07/14/2018)   Depression    DVT (deep venous thrombosis) (HCC) LLE   Hepatitis C     finished harvoni tx ~ 2017   Hypercholesterolemia    Hypertension    Prostate cancer (HCC) 65yrs ago   Pulmonary embolism and infarction (HCC) 07/13/2018   Sleep apnea    not currently using cpap, mask causing vertigo   Type II diabetes mellitus (HCC)     Past Surgical History:  Procedure Laterality Date   BIOPSY  01/12/2019   Procedure: BIOPSY;  Surgeon: Bernette Redbird, MD;  Location: [facility] ENDOSCOPY;  Service: Endoscopy;;   BIOPSY  06/23/2019   Procedure: BIOPSY;  Surgeon: Willis Modena, MD;  Location: [facility] ENDOSCOPY;  Service: Endoscopy;;   COLONOSCOPY WITH PROPOFOL N/A 02/19/2014   Procedure: COLONOSCOPY WITH PROPOFOL;  Surgeon: Charolett Bumpers, MD;  Location: [facility] ENDOSCOPY;  Service: Endoscopy;  Laterality: N/A;   ENDOVENOUS ABLATION SAPHENOUS VEIN W/ LASER Left 11/22/2017   endovenous laser ablation L SSV by Josephina Gip MD    ESOPHAGEAL BRUSHING  06/23/2019   Procedure: ESOPHAGEAL BRUSHING;  Surgeon: Willis Modena, MD;  Location: [facility] ENDOSCOPY;  Service: Endoscopy;;   ESOPHAGOGASTRODUODENOSCOPY (EGD) WITH PROPOFOL N/A 01/12/2019   Procedure:  ESOPHAGOGASTRODUODENOSCOPY (EGD) WITH PROPOFOL;  Surgeon: Bernette Redbird, MD;  Location: [facility] ENDOSCOPY;  Service: Endoscopy;  Laterality: N/A;  Patient is also scheduled for barium swallow; please notify radiology after patient's EGD is complete so that barium swallow follows the endoscopy, not vice versa   ESOPHAGOGASTRODUODENOSCOPY (EGD) WITH PROPOFOL N/A 06/23/2019   Procedure: ESOPHAGOGASTRODUODENOSCOPY (EGD) WITH PROPOFOL;  Surgeon: Willis Modena, MD;  Location: [facility] ENDOSCOPY;  Service: Endoscopy;  Laterality: N/A;   PROSTATECTOMY  2008   REPAIR QUADRICEPS / HAMSTRING MUSCLE Right    Family History:  Family History  Problem Relation Age of Onset   Cancer Father        PROSTATE   Family Psychiatric  History: Unremarkable Social History:  Social History   Substance and Sexual Activity  Alcohol Use Yes     Social History   Substance and Sexual Activity  Drug Use Yes    Social History   Socioeconomic History   Marital status: Single    Spouse name: Not on file   Number of children: 2   Years of education: 12th   Highest education level: Not on file  Occupational History   Occupation: post office  Tobacco Use   Smoking status: Every Day    Current packs/day: 0.12    Average packs/day: 0.1 packs/day for 45.0 years (5.4 ttl pk-yrs)    Types: Cigarettes   Smokeless tobacco: Never  Vaping Use   Vaping status: Never Used  Substance and Sexual  Activity   Alcohol use: Yes   Drug use: Yes   Sexual activity: Not Currently  Other Topics Concern   Not on file  Social History Narrative   ** Merged History Encounter **       Patient lives at home alone.Marland KitchenMarland KitchenDrinks coffee daily    Social Determinants of Health   Financial Resource Strain: Low Risk  (10/27/2022)   Received from [facility]   Overall Financial Resource Strain (CARDIA)    Difficulty of Paying Living Expenses: Not very hard  Food Insecurity: No Food Insecurity (04/20/2023)   Hunger Vital Sign    Worried About  Running Out of Food in the Last Year: Never true    Ran Out of Food in the Last Year: Never true  Transportation Needs: No Transportation Needs (04/20/2023)   PRAPARE - Administrator, Civil Service (Medical): No    Lack of Transportation (Non-Medical): No  Physical Activity: Inactive (10/27/2022)   Received from [facility]   Exercise Vital Sign    Days of Exercise per Week: 0 days    Minutes of Exercise per Session: 0 min  Stress: Stress Concern Present (10/27/2022)   Received from [facility] - Occupational Stress Questionnaire    Feeling of Stress : To some extent  Social Connections: Socially Isolated (10/27/2022)   Received from [facility]   Social Connection and Isolation Panel [NHANES]    Frequency of Communication with Friends and Family: More than three times a week    Frequency of Social Gatherings with Friends and Family: More than three times a week    Attends Religious Services: Never    Database administrator or Organizations: No    Attends Engineer, structural: Never    Marital Status: Divorced   Additional Social History:                         Sleep: Good  Appetite:  Good  Current Medications: Current Facility-Administered Medications  Medication Dose Route Frequency Provider Last Rate Last Admin   alum & mag hydroxide-simeth (MAALOX/MYLANTA) 200-200-20 MG/5ML suspension 30 mL  30 mL Oral Q4H PRN Weber, Kyra A, NP       amLODipine (NORVASC) tablet 5 mg  5 mg Oral Daily Weber, Kyra A, NP   5 mg at 04/29/23 1001   busPIRone (BUSPAR) tablet 10 mg  10 mg Oral BID Sarina Ill, DO   10 mg at 04/29/23 1001   diphenhydrAMINE (BENADRYL) capsule 50 mg  50 mg Oral TID PRN Phebe Colla A, NP       Or   diphenhydrAMINE (BENADRYL) injection 50 mg  50 mg Intramuscular TID PRN Weber, Kyra A, NP       divalproex (DEPAKOTE) DR tablet 500 mg  500 mg Oral BID Lewanda Rife, MD   500  mg at 04/29/23 1000   haloperidol (HALDOL) tablet 5 mg  5 mg Oral TID PRN Weber, Kyra A, NP       Or   haloperidol lactate (HALDOL) injection 5 mg  5 mg Intramuscular TID PRN Weber, Kyra A, NP       HYDROcodone-acetaminophen (NORCO) 10-325 MG per tablet 1 tablet  1 tablet Oral Q8H PRN Lewanda Rife, MD   1 tablet at 04/29/23 0530   hydrOXYzine (ATARAX) tablet 25 mg  25 mg Oral Q4H PRN Lewanda Rife, MD  ibuprofen (ADVIL) tablet 400 mg  400 mg Oral Q6H PRN Lewanda Rife, MD   400 mg at 04/28/23 1835   influenza vaccine adjuvanted (FLUAD) injection 0.5 mL  0.5 mL Intramuscular Tomorrow-1000 Sarina Ill, DO       LORazepam (ATIVAN) tablet 2 mg  2 mg Oral TID PRN Weber, Bella Kennedy A, NP       Or   LORazepam (ATIVAN) injection 2 mg  2 mg Intramuscular TID PRN Weber, Kyra A, NP       magnesium hydroxide (MILK OF MAGNESIA) suspension 30 mL  30 mL Oral Daily PRN Weber, Kyra A, NP   30 mL at 04/24/23 1609   melatonin tablet 5 mg  5 mg Oral QHS Lewanda Rife, MD   5 mg at 04/28/23 2128   nicotine (NICODERM CQ - dosed in mg/24 hours) patch 14 mg  14 mg Transdermal Daily Lewanda Rife, MD   14 mg at 04/29/23 1002   pneumococcal 20-valent conjugate vaccine (PREVNAR 20) injection 0.5 mL  0.5 mL Intramuscular Tomorrow-1000 Sarina Ill, DO       sertraline (ZOLOFT) tablet 100 mg  100 mg Oral Daily Sarina Ill, DO   100 mg at 04/29/23 1001   traZODone (DESYREL) tablet 200 mg  200 mg Oral QHS Sarina Ill, DO   200 mg at 04/28/23 2128    Lab Results: No results found for this or any previous visit (from the past 48 hour(s)).  Blood Alcohol level:  Lab Results  Component Value Date   ETH <10 04/20/2023   ETH <10 02/15/2022    Metabolic Disorder Labs: Lab Results  Component Value Date   HGBA1C 5.8 (H) 09/02/2021   MPG 119.76 09/02/2021   MPG 111.15 09/03/2020   No results found for: "PROLACTIN" Lab Results  Component Value Date    CHOL  03/11/2009    128        ATP III CLASSIFICATION:  <200     mg/dL   Desirable  161-096  mg/dL   Borderline High  >=045    mg/dL   High          TRIG 59 03/11/2009   HDL 45 03/11/2009   CHOLHDL 2.8 03/11/2009   VLDL 12 03/11/2009   LDLCALC  03/11/2009    71        Total Cholesterol/HDL:CHD Risk Coronary Heart Disease Risk Table                     Men   Women  1/2 Average Risk   3.4   3.3  Average Risk       5.0   4.4  2 X Average Risk   9.6   7.1  3 X Average Risk  23.4   11.0        Use the calculated Patient Ratio above and the CHD Risk Table to determine the patient's CHD Risk.        ATP III CLASSIFICATION (LDL):  <100     mg/dL   Optimal  409-811  mg/dL   Near or Above                    Optimal  130-159  mg/dL   Borderline  914-782  mg/dL   High  >956     mg/dL   Very High    Physical Findings: AIMS:  , ,  ,  ,    CIWA:  COWS:     Musculoskeletal: Strength & Muscle Tone: within normal limits Gait & Station: normal Patient leans: N/A  Psychiatric Specialty Exam:  Presentation  General Appearance:  Appropriate for Environment  Eye Contact: Fair  Speech: Slow; Clear and Coherent  Speech Volume: Decreased  Handedness: Right   Mood and Affect  Mood: Anxious; Depressed; Hopeless  Affect: Congruent; Constricted; Restricted; Tearful   Thought Process  Thought Processes: Coherent; Goal Directed  Descriptions of Associations:Intact  Orientation:Full (Time, Place and Person)  Thought Content:Abstract Reasoning  History of Schizophrenia/Schizoaffective disorder:No data recorded Duration of Psychotic Symptoms:No data recorded Hallucinations:No data recorded Ideas of Reference:None  Suicidal Thoughts:No data recorded Homicidal Thoughts:No data recorded  Sensorium  Memory: Immediate Fair; Recent Fair  Judgment: Impaired  Insight: Shallow   Executive Functions  Concentration: Fair  Attention  Span: Fair  Recall: [employer] of Knowledge: Fair  Language: Fair   Psychomotor Activity  Psychomotor Activity:No data recorded  Assets  Assets: Communication Skills; Desire for Improvement; Housing   Sleep  Sleep:No data recorded    Blood pressure 122/80, pulse 71, temperature 98.3 F (36.8 C), resp. rate 18, height 5\' 9"  (1.753 m), weight 92.5 kg, SpO2 (!) 88%. Body mass index is 30.13 kg/m.   Treatment Plan Summary: Daily contact with patient to assess and evaluate symptoms and progress in treatment, Medication management, and Plan increase Zoloft to 150 mg/day.  Sarina Ill, DO 04/29/2023, 1:59 PM

## 2023-04-30 DIAGNOSIS — F3130 Bipolar disorder, current episode depressed, mild or moderate severity, unspecified: Secondary | ICD-10-CM | POA: Diagnosis not present

## 2023-04-30 NOTE — Progress Notes (Signed)
D- Patient alert and oriented. Endorses anxiety. C/o knee pain 8/10.  Denies SI, HI, AVH, and pain.  A- Scheduled medications administered to patient, per MD orders. PRN given for pain/discomfort. Support and encouragement provided.  Routine safety checks conducted every 15 minutes.  Patient informed to notify staff with problems or concerns. R- No adverse drug reactions noted. Patient contracts for safety at this time. Patient compliant with medications and treatment plan. Patient receptive, calm, and cooperative. Patient interacts well with others on the unit.  Slept well throughout the night. Patient remains safe at this time.

## 2023-04-30 NOTE — Progress Notes (Signed)
[facility] MD Progress Note  04/30/2023 12:16 PM Todd [name].  MRN:  [id number] Subjective: Todd [name] is seen on rounds.  His been in bed a lot.  He states he feels pretty good.  He went up on his Zoloft yesterday.  Nurses report no issues.  He denies any side effects from his medications. Principal Problem: Bipolar I disorder, most recent episode depressed (HCC) Diagnosis: Principal Problem:   Bipolar I disorder, most recent episode depressed (HCC) Active Problems:   Suicidal ideations  Total Time spent with patient: 15 minutes  Past Psychiatric History: Bipolar depression  Past Medical History:  Past Medical History:  Diagnosis Date   Arthritis    "fingers" (07/14/2018)   Depression    DVT (deep venous thrombosis) (HCC) LLE   Hepatitis C     finished harvoni tx ~ 2017   Hypercholesterolemia    Hypertension    Prostate cancer (HCC) 53yrs ago   Pulmonary embolism and infarction (HCC) 07/13/2018   Sleep apnea    not currently using cpap, mask causing vertigo   Type II diabetes mellitus (HCC)     Past Surgical History:  Procedure Laterality Date   BIOPSY  01/12/2019   Procedure: BIOPSY;  Surgeon: Bernette Redbird, MD;  Location: [facility] ENDOSCOPY;  Service: Endoscopy;;   BIOPSY  06/23/2019   Procedure: BIOPSY;  Surgeon: Willis Modena, MD;  Location: [facility] ENDOSCOPY;  Service: Endoscopy;;   COLONOSCOPY WITH PROPOFOL N/A 02/19/2014   Procedure: COLONOSCOPY WITH PROPOFOL;  Surgeon: Charolett Bumpers, MD;  Location: [facility] ENDOSCOPY;  Service: Endoscopy;  Laterality: N/A;   ENDOVENOUS ABLATION SAPHENOUS VEIN W/ LASER Left 11/22/2017   endovenous laser ablation L SSV by Josephina Gip MD    ESOPHAGEAL BRUSHING  06/23/2019   Procedure: ESOPHAGEAL BRUSHING;  Surgeon: Willis Modena, MD;  Location: [facility] ENDOSCOPY;  Service: Endoscopy;;   ESOPHAGOGASTRODUODENOSCOPY (EGD) WITH PROPOFOL N/A 01/12/2019   Procedure: ESOPHAGOGASTRODUODENOSCOPY (EGD) WITH PROPOFOL;  Surgeon: Bernette Redbird, MD;  Location: [facility]  ENDOSCOPY;  Service: Endoscopy;  Laterality: N/A;  Patient is also scheduled for barium swallow; please notify radiology after patient's EGD is complete so that barium swallow follows the endoscopy, not vice versa   ESOPHAGOGASTRODUODENOSCOPY (EGD) WITH PROPOFOL N/A 06/23/2019   Procedure: ESOPHAGOGASTRODUODENOSCOPY (EGD) WITH PROPOFOL;  Surgeon: Willis Modena, MD;  Location: [facility] ENDOSCOPY;  Service: Endoscopy;  Laterality: N/A;   PROSTATECTOMY  2008   REPAIR QUADRICEPS / HAMSTRING MUSCLE Right    Family History:  Family History  Problem Relation Age of Onset   Cancer Father        PROSTATE   Family Psychiatric  History: Unremarkable Social History:  Social History   Substance and Sexual Activity  Alcohol Use Yes     Social History   Substance and Sexual Activity  Drug Use Yes    Social History   Socioeconomic History   Marital status: Single    Spouse name: Not on file   Number of children: 2   Years of education: 12th   Highest education level: Not on file  Occupational History   Occupation: post office  Tobacco Use   Smoking status: Every Day    Current packs/day: 0.12    Average packs/day: 0.1 packs/day for 45.0 years (5.4 ttl pk-yrs)    Types: Cigarettes   Smokeless tobacco: Never  Vaping Use   Vaping status: Never Used  Substance and Sexual Activity   Alcohol use: Yes   Drug use: Yes   Sexual activity: Not Currently  Other Topics  Concern   Not on file  Social History Narrative   ** Merged History Encounter **       Patient lives at home alone.Marland KitchenMarland KitchenDrinks coffee daily    Social Determinants of Health   Financial Resource Strain: Low Risk  (10/27/2022)   Received from [facility]   Overall Financial Resource Strain (CARDIA)    Difficulty of Paying Living Expenses: Not very hard  Food Insecurity: No Food Insecurity (04/20/2023)   Hunger Vital Sign    Worried About Running Out of Food in the Last Year: Never true    Ran Out of Food in the Last Year: Never  true  Transportation Needs: No Transportation Needs (04/20/2023)   PRAPARE - Administrator, Civil Service (Medical): No    Lack of Transportation (Non-Medical): No  Physical Activity: Inactive (10/27/2022)   Received from [facility]   Exercise Vital Sign    Days of Exercise per Week: 0 days    Minutes of Exercise per Session: 0 min  Stress: Stress Concern Present (10/27/2022)   Received from [facility] of Occupational Health - Occupational Stress Questionnaire    Feeling of Stress : To some extent  Social Connections: Socially Isolated (10/27/2022)   Received from [facility]   Social Connection and Isolation Panel [NHANES]    Frequency of Communication with Friends and Family: More than three times a week    Frequency of Social Gatherings with Friends and Family: More than three times a week    Attends Religious Services: Never    Database administrator or Organizations: No    Attends Engineer, structural: Never    Marital Status: Divorced   Additional Social History:                         Sleep: Good  Appetite:  Good  Current Medications: Current Facility-Administered Medications  Medication Dose Route Frequency Provider Last Rate Last Admin   alum & mag hydroxide-simeth (MAALOX/MYLANTA) 200-200-20 MG/5ML suspension 30 mL  30 mL Oral Q4H PRN Weber, Kyra A, NP       amLODipine (NORVASC) tablet 5 mg  5 mg Oral Daily Weber, Kyra A, NP   5 mg at 04/30/23 0947   busPIRone (BUSPAR) tablet 10 mg  10 mg Oral BID Sarina Ill, DO   10 mg at 04/30/23 8295   diphenhydrAMINE (BENADRYL) capsule 50 mg  50 mg Oral TID PRN Phebe Colla A, NP       Or   diphenhydrAMINE (BENADRYL) injection 50 mg  50 mg Intramuscular TID PRN Weber, Kyra A, NP       divalproex (DEPAKOTE) DR tablet 500 mg  500 mg Oral BID Lewanda Rife, MD   500 mg at 04/30/23 0947   haloperidol (HALDOL) tablet 5 mg  5 mg Oral TID PRN Weber, Kyra A, NP        Or   haloperidol lactate (HALDOL) injection 5 mg  5 mg Intramuscular TID PRN Weber, Kyra A, NP       HYDROcodone-acetaminophen (NORCO) 10-325 MG per tablet 1 tablet  1 tablet Oral Q8H PRN Lewanda Rife, MD   1 tablet at 04/29/23 1554   hydrOXYzine (ATARAX) tablet 25 mg  25 mg Oral Q4H PRN Lewanda Rife, MD       ibuprofen (ADVIL) tablet 400 mg  400 mg Oral Q6H PRN Lewanda Rife, MD   400  mg at 04/28/23 1835   influenza vaccine adjuvanted (FLUAD) injection 0.5 mL  0.5 mL Intramuscular Tomorrow-1000 Sarina Ill, DO       LORazepam (ATIVAN) tablet 2 mg  2 mg Oral TID PRN Weber, Bella Kennedy A, NP       Or   LORazepam (ATIVAN) injection 2 mg  2 mg Intramuscular TID PRN Weber, Kyra A, NP       magnesium hydroxide (MILK OF MAGNESIA) suspension 30 mL  30 mL Oral Daily PRN Weber, Kyra A, NP   30 mL at 04/24/23 1609   melatonin tablet 5 mg  5 mg Oral QHS Lewanda Rife, MD   5 mg at 04/29/23 2119   nicotine (NICODERM CQ - dosed in mg/24 hours) patch 14 mg  14 mg Transdermal Daily Lewanda Rife, MD   14 mg at 04/29/23 1002   pneumococcal 20-valent conjugate vaccine (PREVNAR 20) injection 0.5 mL  0.5 mL Intramuscular Tomorrow-1000 Sarina Ill, DO       sertraline (ZOLOFT) tablet 150 mg  150 mg Oral Daily Sarina Ill, DO   150 mg at 04/30/23 1610   traZODone (DESYREL) tablet 200 mg  200 mg Oral QHS Sarina Ill, DO   200 mg at 04/29/23 2119    Lab Results:  Results for orders placed or performed during the hospital encounter of 04/20/23 (from the past 48 hour(s))  Glucose, capillary     Status: Abnormal   Collection Time: 04/29/23  6:20 PM  Result Value Ref Range   Glucose-Capillary 126 (H) 70 - 99 mg/dL    Comment: Glucose reference range applies only to samples taken after fasting for at least 8 hours.    Blood Alcohol level:  Lab Results  Component Value Date   ETH <10 04/20/2023   ETH <10 02/15/2022    Metabolic Disorder  Labs: Lab Results  Component Value Date   HGBA1C 5.8 (H) 09/02/2021   MPG 119.76 09/02/2021   MPG 111.15 09/03/2020   No results found for: "PROLACTIN" Lab Results  Component Value Date   CHOL  03/11/2009    128        ATP III CLASSIFICATION:  <200     mg/dL   Desirable  960-454  mg/dL   Borderline High  >=098    mg/dL   High          TRIG 59 03/11/2009   HDL 45 03/11/2009   CHOLHDL 2.8 03/11/2009   VLDL 12 03/11/2009   LDLCALC  03/11/2009    71        Total Cholesterol/HDL:CHD Risk Coronary Heart Disease Risk Table                     Men   Women  1/2 Average Risk   3.4   3.3  Average Risk       5.0   4.4  2 X Average Risk   9.6   7.1  3 X Average Risk  23.4   11.0        Use the calculated Patient Ratio above and the CHD Risk Table to determine the patient's CHD Risk.        ATP III CLASSIFICATION (LDL):  <100     mg/dL   Optimal  119-147  mg/dL   Near or Above                    Optimal  130-159  mg/dL  Borderline  160-189  mg/dL   High  >865     mg/dL   Very High    Physical Findings: AIMS:  , ,  ,  ,    CIWA:    COWS:     Musculoskeletal: Strength & Muscle Tone: within normal limits Gait & Station: normal Patient leans: N/A  Psychiatric Specialty Exam:  Presentation  General Appearance:  Appropriate for Environment  Eye Contact: Fair  Speech: Slow; Clear and Coherent  Speech Volume: Decreased  Handedness: Right   Mood and Affect  Mood: Anxious; Depressed; Hopeless  Affect: Congruent; Constricted; Restricted; Tearful   Thought Process  Thought Processes: Coherent; Goal Directed  Descriptions of Associations:Intact  Orientation:Full (Time, Place and Person)  Thought Content:Abstract Reasoning  History of Schizophrenia/Schizoaffective disorder:No data recorded Duration of Psychotic Symptoms:No data recorded Hallucinations:No data recorded Ideas of Reference:None  Suicidal Thoughts:No data recorded Homicidal  Thoughts:No data recorded  Sensorium  Memory: Immediate Fair; Recent Fair  Judgment: Impaired  Insight: Shallow   Executive Functions  Concentration: Fair  Attention Span: Fair  Recall: [employer] of Knowledge: Fair  Language: Fair   Psychomotor Activity  Psychomotor Activity:No data recorded  Assets  Assets: Communication Skills; Desire for Improvement; Housing   Sleep  Sleep:No data recorded    Blood pressure 127/78, pulse 72, temperature 97.9 F (36.6 C), resp. rate 18, height 5\' 9"  (1.753 m), weight 92.5 kg, SpO2 96%. Body mass index is 30.13 kg/m.   Treatment Plan Summary: Daily contact with patient to assess and evaluate symptoms and progress in treatment, Medication management, and Plan continue current medications.  Sarina Ill, DO 04/30/2023, 12:16 PM

## 2023-04-30 NOTE — Plan of Care (Signed)
  Problem: Education: Goal: Knowledge of General Education information will improve Description: Including pain rating scale, medication(s)/side effects and non-pharmacologic comfort measures Outcome: Progressing   Problem: Health Behavior/Discharge Planning: Goal: Compliance with therapeutic regimen will improve Outcome: Progressing   Problem: Role Relationship: Goal: Will demonstrate positive changes in social behaviors and relationships Outcome: Progressing   Problem: Self-Concept: Goal: Level of anxiety will decrease Outcome: Progressing

## 2023-04-30 NOTE — Group Note (Signed)
Date:  04/30/2023 Time:  12:36 PM  Group Topic/Focus:  Outdoor recreation. Sales executive.    Participation Level:  Did Not Attend   Todd [name] 04/30/2023, 12:36 PM

## 2023-04-30 NOTE — Plan of Care (Signed)

## 2023-04-30 NOTE — Group Note (Signed)
Date:  04/30/2023 Time:  5:57 PM  Group Topic/Focus:  Goals Group:   The focus of this group is to help patients establish daily goals to achieve during treatment and discuss how the patient can incorporate goal setting into their daily lives to aide in recovery.    Participation Level:  Active  Participation Quality:  Appropriate and Attentive  Affect:  Appropriate  Cognitive:  Alert, Appropriate, and Oriented  Insight: Appropriate  Engagement in Group:  Developing/Improving and Engaged  Modes of Intervention:  Activity, Discussion, and Socialization  Additional Comments:    [name] 04/30/2023, 5:57 PM

## 2023-04-30 NOTE — Progress Notes (Signed)
Patient cooperative, slightly irritable.  Irritable affect. Returned to bed after breakfast.  Endorses anxiety and feeling tired.  Denies SI/HI and AVH.   Compliant with scheduled medications.  15 min checks in place for safety.  Patient present in the milieu intermittently.  Appropriate interaction with peers.  Demanding of staff at times, but re-directable.  Pain rated 10/10 in knees.  PRN pain medication given x 1 as ordered.

## 2023-04-30 NOTE — Group Note (Signed)
[facility] LCSW Group Therapy Note   Group Date: 04/30/2023 Start Time: 1320 End Time: 1405   Type of Therapy/Topic:  Group Therapy:  Balance in Life  Participation Level:  Did Not Attend   Description of Group:    This group will address the concept of balance and how it feels and looks when one is unbalanced. Patients will be encouraged to process areas in their lives that are out of balance, and identify reasons for remaining unbalanced. Facilitators will guide patients utilizing problem- solving interventions to address and correct the stressor making their life unbalanced. Understanding and applying boundaries will be explored and addressed for obtaining  and maintaining a balanced life. Patients will be encouraged to explore ways to assertively make their unbalanced needs known to significant others in their lives, using other group members and facilitator for support and feedback.  Therapeutic Goals: Patient will identify two or more emotions or situations they have that consume much of in their lives. Patient will identify signs/triggers that life has become out of balance:  Patient will identify two ways to set boundaries in order to achieve balance in their lives:  Patient will demonstrate ability to communicate their needs through discussion and/or role plays  Summary of Patient Progress:    Patient did not attend group    Azucena Kuba, LCSWA

## 2023-05-01 DIAGNOSIS — F3130 Bipolar disorder, current episode depressed, mild or moderate severity, unspecified: Secondary | ICD-10-CM | POA: Diagnosis not present

## 2023-05-01 NOTE — Plan of Care (Signed)
Problem: Education: Goal: Knowledge of General Education information will improve Description: Including pain rating scale, medication(s)/side effects and non-pharmacologic comfort measures Outcome: Progressing   Problem: Health Behavior/Discharge Planning: Goal: Ability to manage health-related needs will improve Outcome: Progressing   Problem: Clinical Measurements: Goal: Ability to maintain clinical measurements within normal limits will improve Outcome: Progressing Goal: Will remain free from infection Outcome: Progressing Goal: Diagnostic test results will improve Outcome: Progressing Goal: Respiratory complications will improve Outcome: Progressing Goal: Cardiovascular complication will be avoided Outcome: Progressing   Problem: Activity: Goal: Risk for activity intolerance will decrease Outcome: Progressing   Problem: Nutrition: Goal: Adequate nutrition will be maintained Outcome: Progressing   Problem: Coping: Goal: Level of anxiety will decrease Outcome: Progressing   Problem: Elimination: Goal: Will not experience complications related to bowel motility Outcome: Progressing Goal: Will not experience complications related to urinary retention Outcome: Progressing   Problem: Pain Managment: Goal: General experience of comfort will improve Outcome: Progressing   Problem: Safety: Goal: Ability to remain free from injury will improve Outcome: Progressing   Problem: Skin Integrity: Goal: Risk for impaired skin integrity will decrease Outcome: Progressing   Problem: Education: Goal: Knowledge of [place] [facility] Education information/materials will improve Outcome: Progressing Goal: Emotional status will improve Outcome: Progressing Goal: Mental status will improve Outcome: Progressing Goal: Verbalization of understanding the information provided will improve Outcome: Progressing   Problem: Activity: Goal: Interest or engagement in activities will  improve Outcome: Progressing Goal: Sleeping patterns will improve Outcome: Progressing   Problem: Coping: Goal: Ability to verbalize frustrations and anger appropriately will improve Outcome: Progressing Goal: Ability to demonstrate self-control will improve Outcome: Progressing   Problem: Health Behavior/Discharge Planning: Goal: Identification of resources available to assist in meeting health care needs will improve Outcome: Progressing Goal: Compliance with treatment plan for underlying cause of condition will improve Outcome: Progressing   Problem: Physical Regulation: Goal: Ability to maintain clinical measurements within normal limits will improve Outcome: Progressing   Problem: Safety: Goal: Periods of time without injury will increase Outcome: Progressing   Problem: Education: Goal: Ability to make informed decisions regarding treatment will improve Outcome: Progressing   Problem: Coping: Goal: Coping ability will improve Outcome: Progressing   Problem: Health Behavior/Discharge Planning: Goal: Identification of resources available to assist in meeting health care needs will improve Outcome: Progressing   Problem: Medication: Goal: Compliance with prescribed medication regimen will improve Outcome: Progressing   Problem: Self-Concept: Goal: Ability to disclose and discuss suicidal ideas will improve Outcome: Progressing Goal: Will verbalize positive feelings about self Outcome: Progressing   Problem: Education: Goal: Utilization of techniques to improve thought processes will improve Outcome: Progressing Goal: Knowledge of the prescribed therapeutic regimen will improve Outcome: Progressing   Problem: Activity: Goal: Interest or engagement in leisure activities will improve Outcome: Progressing Goal: Imbalance in normal sleep/wake cycle will improve Outcome: Progressing   Problem: Coping: Goal: Coping ability will improve Outcome:  Progressing Goal: Will verbalize feelings Outcome: Progressing   Problem: Health Behavior/Discharge Planning: Goal: Ability to make decisions will improve Outcome: Progressing Goal: Compliance with therapeutic regimen will improve Outcome: Progressing   Problem: Role Relationship: Goal: Will demonstrate positive changes in social behaviors and relationships Outcome: Progressing   Problem: Safety: Goal: Ability to disclose and discuss suicidal ideas will improve Outcome: Progressing Goal: Ability to identify and utilize support systems that promote safety will improve Outcome: Progressing   Problem: Self-Concept: Goal: Will verbalize positive feelings about self Outcome: Progressing Goal: Level of anxiety will decrease  Outcome: Progressing

## 2023-05-01 NOTE — Progress Notes (Signed)
[facility] MD Progress Note  05/01/2023 11:57 AM Todd [name].  MRN:  [id number] Subjective: Todd [name] seen on rounds.  He has no complaints.  He has been compliant with medications and denies any side effects.  Nurses report no issues.  We had a discussion about being discharged soon and he understands.  He is homeless.  He denies any suicidal ideation at this point in time.  I need to speak with social work tomorrow to see what his outpatient possibilities are. Principal Problem: Bipolar I disorder, most recent episode depressed (HCC) Diagnosis: Principal Problem:   Bipolar I disorder, most recent episode depressed (HCC) Active Problems:   Suicidal ideations  Total Time spent with patient: 15 minutes  Past Psychiatric History: Depression  Past Medical History:  Past Medical History:  Diagnosis Date   Arthritis    "fingers" (07/14/2018)   Depression    DVT (deep venous thrombosis) (HCC) LLE   Hepatitis C     finished [place] tx ~ 2017   Hypercholesterolemia    Hypertension    Prostate cancer (HCC) 73yrs ago   Pulmonary embolism and infarction (HCC) 07/13/2018   Sleep apnea    not currently using cpap, mask causing vertigo   Type II diabetes mellitus (HCC)     Past Surgical History:  Procedure Laterality Date   BIOPSY  01/12/2019   Procedure: BIOPSY;  Surgeon: Bernette Redbird, MD;  Location: [facility] ENDOSCOPY;  Service: Endoscopy;;   BIOPSY  06/23/2019   Procedure: BIOPSY;  Surgeon: Willis Modena, MD;  Location: [facility] ENDOSCOPY;  Service: Endoscopy;;   COLONOSCOPY WITH PROPOFOL N/A 02/19/2014   Procedure: COLONOSCOPY WITH PROPOFOL;  Surgeon: Charolett Bumpers, MD;  Location: [facility] ENDOSCOPY;  Service: Endoscopy;  Laterality: N/A;   ENDOVENOUS ABLATION SAPHENOUS VEIN W/ LASER Left 11/22/2017   endovenous laser ablation L SSV by Josephina Gip MD    ESOPHAGEAL BRUSHING  06/23/2019   Procedure: ESOPHAGEAL BRUSHING;  Surgeon: Willis Modena, MD;  Location: [facility] ENDOSCOPY;  Service: Endoscopy;;    ESOPHAGOGASTRODUODENOSCOPY (EGD) WITH PROPOFOL N/A 01/12/2019   Procedure: ESOPHAGOGASTRODUODENOSCOPY (EGD) WITH PROPOFOL;  Surgeon: Bernette Redbird, MD;  Location: [facility] ENDOSCOPY;  Service: Endoscopy;  Laterality: N/A;  Patient is also scheduled for barium swallow; please notify radiology after patient's EGD is complete so that barium swallow follows the endoscopy, not vice versa   ESOPHAGOGASTRODUODENOSCOPY (EGD) WITH PROPOFOL N/A 06/23/2019   Procedure: ESOPHAGOGASTRODUODENOSCOPY (EGD) WITH PROPOFOL;  Surgeon: Willis Modena, MD;  Location: [facility] ENDOSCOPY;  Service: Endoscopy;  Laterality: N/A;   PROSTATECTOMY  2008   REPAIR QUADRICEPS / HAMSTRING MUSCLE Right    Family History:  Family History  Problem Relation Age of Onset   Cancer Father        PROSTATE   Family Psychiatric  History: Unremarkable Social History:  Social History   Substance and Sexual Activity  Alcohol Use Yes     Social History   Substance and Sexual Activity  Drug Use Yes    Social History   Socioeconomic History   Marital status: Single    Spouse name: Not on file   Number of children: 2   Years of education: 12th   Highest education level: Not on file  Occupational History   Occupation: post office  Tobacco Use   Smoking status: Every Day    Current packs/day: 0.12    Average packs/day: 0.1 packs/day for 45.0 years (5.4 ttl pk-yrs)    Types: Cigarettes   Smokeless tobacco: Never  Vaping Use   Vaping  status: Never Used  Substance and Sexual Activity   Alcohol use: Yes   Drug use: Yes   Sexual activity: Not Currently  Other Topics Concern   Not on file  Social History Narrative   ** Merged History Encounter **       Patient lives at home alone.Marland KitchenMarland KitchenDrinks coffee daily    Social Determinants of Health   Financial Resource Strain: Low Risk  (10/27/2022)   Received from [facility]   Overall Financial Resource Strain (CARDIA)    Difficulty of Paying Living Expenses: Not very hard  Food  Insecurity: No Food Insecurity (04/20/2023)   Hunger Vital Sign    Worried About Running Out of Food in the Last Year: Never true    Ran Out of Food in the Last Year: Never true  Transportation Needs: No Transportation Needs (04/20/2023)   PRAPARE - Administrator, Civil Service (Medical): No    Lack of Transportation (Non-Medical): No  Physical Activity: Inactive (10/27/2022)   Received from [facility]   Exercise Vital Sign    Days of Exercise per Week: 0 days    Minutes of Exercise per Session: 0 min  Stress: Stress Concern Present (10/27/2022)   Received from [facility] of [facility] - Occupational Stress Questionnaire    Feeling of Stress : To some extent  Social Connections: Socially Isolated (10/27/2022)   Received from [facility]   Social Connection and Isolation Panel [NHANES]    Frequency of Communication with Friends and Family: More than three times a week    Frequency of Social Gatherings with Friends and Family: More than three times a week    Attends Religious Services: Never    Database administrator or Organizations: No    Attends Engineer, structural: Never    Marital Status: Divorced   Additional Social History:                         Sleep: Good  Appetite:  Good  Current Medications: Current Facility-Administered Medications  Medication Dose Route Frequency Provider Last Rate Last Admin   alum & mag hydroxide-simeth (MAALOX/MYLANTA) 200-200-20 MG/5ML suspension 30 mL  30 mL Oral Q4H PRN Weber, Kyra A, NP       amLODipine (NORVASC) tablet 5 mg  5 mg Oral Daily Weber, Kyra A, NP   5 mg at 05/01/23 0981   busPIRone (BUSPAR) tablet 10 mg  10 mg Oral BID Sarina Ill, DO   10 mg at 05/01/23 1914   diphenhydrAMINE (BENADRYL) capsule 50 mg  50 mg Oral TID PRN Phebe Colla A, NP       Or   diphenhydrAMINE (BENADRYL) injection 50 mg  50 mg Intramuscular TID PRN Weber, Kyra A, NP        divalproex (DEPAKOTE) DR tablet 500 mg  500 mg Oral BID Lewanda Rife, MD   500 mg at 05/01/23 7829   haloperidol (HALDOL) tablet 5 mg  5 mg Oral TID PRN Weber, Kyra A, NP       Or   haloperidol lactate (HALDOL) injection 5 mg  5 mg Intramuscular TID PRN Weber, Kyra A, NP       HYDROcodone-acetaminophen (NORCO) 10-325 MG per tablet 1 tablet  1 tablet Oral Q8H PRN Lewanda Rife, MD   1 tablet at 05/01/23 1152   hydrOXYzine (ATARAX) tablet 25 mg  25 mg Oral Q4H  PRN Lewanda Rife, MD       ibuprofen (ADVIL) tablet 400 mg  400 mg Oral Q6H PRN Lewanda Rife, MD   400 mg at 04/28/23 1835   influenza vaccine adjuvanted (FLUAD) injection 0.5 mL  0.5 mL Intramuscular Tomorrow-1000 Sarina Ill, DO       LORazepam (ATIVAN) tablet 2 mg  2 mg Oral TID PRN Weber, Bella Kennedy A, NP       Or   LORazepam (ATIVAN) injection 2 mg  2 mg Intramuscular TID PRN Weber, Bella Kennedy A, NP       magnesium hydroxide (MILK OF MAGNESIA) suspension 30 mL  30 mL Oral Daily PRN Weber, Kyra A, NP   30 mL at 04/24/23 1609   melatonin tablet 5 mg  5 mg Oral QHS Lewanda Rife, MD   5 mg at 04/30/23 2116   nicotine (NICODERM CQ - dosed in mg/24 hours) patch 14 mg  14 mg Transdermal Daily Lewanda Rife, MD   14 mg at 05/01/23 0926   pneumococcal 20-valent conjugate vaccine (PREVNAR 20) injection 0.5 mL  0.5 mL Intramuscular Tomorrow-1000 Sarina Ill, DO       sertraline (ZOLOFT) tablet 150 mg  150 mg Oral Daily Sarina Ill, DO   150 mg at 05/01/23 1610   traZODone (DESYREL) tablet 200 mg  200 mg Oral QHS Sarina Ill, DO   200 mg at 04/30/23 2116    Lab Results:  Results for orders placed or performed during the hospital encounter of 04/20/23 (from the past 48 hour(s))  Glucose, capillary     Status: Abnormal   Collection Time: 04/29/23  6:20 PM  Result Value Ref Range   Glucose-Capillary 126 (H) 70 - 99 mg/dL    Comment: Glucose reference range applies only to samples  taken after fasting for at least 8 hours.    Blood Alcohol level:  Lab Results  Component Value Date   ETH <10 04/20/2023   ETH <10 02/15/2022    Metabolic Disorder Labs: Lab Results  Component Value Date   HGBA1C 5.8 (H) 09/02/2021   MPG 119.76 09/02/2021   MPG 111.15 09/03/2020   No results found for: "PROLACTIN" Lab Results  Component Value Date   CHOL  03/11/2009    128        ATP III CLASSIFICATION:  <200     mg/dL   Desirable  960-454  mg/dL   Borderline High  >=098    mg/dL   High          TRIG 59 03/11/2009   HDL 45 03/11/2009   CHOLHDL 2.8 03/11/2009   VLDL 12 03/11/2009   LDLCALC  03/11/2009    71        Total Cholesterol/HDL:CHD Risk Coronary Heart Disease Risk Table                     Men   Women  1/2 Average Risk   3.4   3.3  Average Risk       5.0   4.4  2 X Average Risk   9.6   7.1  3 X Average Risk  23.4   11.0        Use the calculated Patient Ratio above and the CHD Risk Table to determine the patient's CHD Risk.        ATP III CLASSIFICATION (LDL):  <100     mg/dL   Optimal  119-147  mg/dL   Near or  Above                    Optimal  130-159  mg/dL   Borderline  284-132  mg/dL   High  >440     mg/dL   Very High    Physical Findings: AIMS:  , ,  ,  ,    CIWA:    COWS:     Musculoskeletal: Strength & Muscle Tone: within normal limits Gait & Station: normal Patient leans: N/A  Psychiatric Specialty Exam:  Presentation  General Appearance:  Appropriate for Environment  Eye Contact: Fair  Speech: Slow; Clear and Coherent  Speech Volume: Decreased  Handedness: Right   Mood and Affect  Mood: Anxious; Depressed; Hopeless  Affect: Congruent; Constricted; Restricted; Tearful   Thought Process  Thought Processes: Coherent; Goal Directed  Descriptions of Associations:Intact  Orientation:Full (Time, Place and Person)  Thought Content:Abstract Reasoning  History of Schizophrenia/Schizoaffective disorder:No data  recorded Duration of Psychotic Symptoms:No data recorded Hallucinations:No data recorded Ideas of Reference:None  Suicidal Thoughts:No data recorded Homicidal Thoughts:No data recorded  Sensorium  Memory: Immediate Fair; Recent Fair  Judgment: Impaired  Insight: Shallow   Executive Functions  Concentration: Fair  Attention Span: Fair  Recall: [employer] of Knowledge: Fair  Language: Fair   Psychomotor Activity  Psychomotor Activity:No data recorded  Assets  Assets: Communication Skills; Desire for Improvement; Housing   Sleep  Sleep:No data recorded   Physical Exam: Physical Exam Vitals and nursing note reviewed.  Constitutional:      Appearance: Normal appearance. He is normal weight.  Neurological:     General: No focal deficit present.     Mental Status: He is alert and oriented to person, place, and time.  Psychiatric:        Attention and Perception: Attention and perception normal.        Mood and Affect: Mood and affect normal.        Speech: Speech normal.        Behavior: Behavior normal. Behavior is cooperative.        Thought Content: Thought content normal.        Cognition and Memory: Cognition and memory normal.        Judgment: Judgment normal.    Review of Systems  Constitutional: Negative.   HENT: Negative.    Eyes: Negative.   Respiratory: Negative.    Cardiovascular: Negative.   Gastrointestinal: Negative.   Genitourinary: Negative.   Musculoskeletal: Negative.   Skin: Negative.   Neurological: Negative.   Endo/Heme/Allergies: Negative.   Psychiatric/Behavioral: Negative.     Blood pressure 120/79, pulse 65, temperature (!) 97.5 F (36.4 C), resp. rate 16, height 5\' 9"  (1.753 m), weight 92.5 kg, SpO2 93%. Body mass index is 30.13 kg/m.   Treatment Plan Summary: Daily contact with patient to assess and evaluate symptoms and progress in treatment, Medication management, and Plan continue current  medications.  Sarina Ill, DO 05/01/2023, 11:57 AM

## 2023-05-01 NOTE — Progress Notes (Signed)
Patient present in the dayroom for breakfast.  Flat affect.  Endorses anxiety and worrying about discharge in the morning.  Denies SI/HI and AVH.  Poor sleep.  Pain 10/10 in knees.   Compliant with scheduled medications.  15 min checks in place for safety.  Present in the milieu. Attempts to control dayroom TV.  Appropriate interaction with peers,  but can be demanding with staff.   PRN medication given for pain x 1.

## 2023-05-01 NOTE — Group Note (Signed)
Date:  05/01/2023 Time:  11:53 PM  Group Topic/Focus:  Making Healthy Choices:   The focus of this group is to help patients identify negative/unhealthy choices they were using prior to admission and identify positive/healthier coping strategies to replace them upon discharge.    Participation Level:  Active  Participation Quality:  Appropriate  Affect:  Appropriate  Cognitive:  Appropriate  Insight: Appropriate  Engagement in Group:  Engaged  Modes of Intervention:  Discussion  Additional Comments:    Maeola Harman 05/01/2023, 11:53 PM

## 2023-05-01 NOTE — Group Note (Signed)
Date:  05/01/2023 Time:  12:06 AM  Group Topic/Focus:  Overcoming Stress:   The focus of this group is to define stress and help patients assess their triggers.    Participation Level:  Active  Participation Quality:  Appropriate  Affect:  Appropriate  Cognitive:  Appropriate  Insight: Appropriate  Engagement in Group:  Engaged  Modes of Intervention:  Exploration  Additional Comments:    Garry Heater 05/01/2023, 12:06 AM

## 2023-05-01 NOTE — Group Note (Signed)
Date:  05/01/2023 Time:  11:02 AM  Group Topic/Focus:  Outside Rec/Music Therapy This group allows paitents to get out and get fresh air while listening to their favorite music   Participation Level:  Did Not Attend  Participation Quality:    Affect:    Cognitive:    Insight:   Engagement in Group:    Modes of Intervention:    Additional Comments:  Did not attend   Trevelle Mcgurn T Bond Grieshop 05/01/2023, 11:02 AM

## 2023-05-01 NOTE — Plan of Care (Signed)
  Problem: Education: Goal: Knowledge of General Education information will improve Description: Including pain rating scale, medication(s)/side effects and non-pharmacologic comfort measures Outcome: Progressing   Problem: Education: Goal: Mental status will improve Outcome: Progressing   Problem: Coping: Goal: Coping ability will improve Outcome: Progressing   Problem: Health Behavior/Discharge Planning: Goal: Compliance with therapeutic regimen will improve Outcome: Progressing

## 2023-05-01 NOTE — Progress Notes (Signed)
Patient disrespectful, demanding and raising his voice at staff.  Patient told this behavior was not acceptable.

## 2023-05-01 NOTE — Progress Notes (Signed)
Patient was cooperative with treatment, He denies SI , HI & AVH, he was compliant with medication regime on the shift. Patient was visible in the milieu  during the evening, he seemed to sleep well through out the night.

## 2023-05-02 DIAGNOSIS — F3130 Bipolar disorder, current episode depressed, mild or moderate severity, unspecified: Secondary | ICD-10-CM | POA: Diagnosis not present

## 2023-05-02 MED ORDER — BUSPIRONE HCL 10 MG PO TABS: 10.0000 mg | ORAL_TABLET | Freq: Two times a day (BID) | ORAL | 3 refills | Status: DC

## 2023-05-02 MED ORDER — AMLODIPINE BESYLATE 5 MG PO TABS: 5.0000 mg | ORAL_TABLET | Freq: Every day | ORAL | 3 refills | Status: DC

## 2023-05-02 MED ORDER — TRAZODONE HCL 100 MG PO TABS: 200.0000 mg | ORAL_TABLET | Freq: Every day | ORAL | 3 refills | Status: DC

## 2023-05-02 MED ORDER — SERTRALINE HCL 50 MG PO TABS: 150.0000 mg | ORAL_TABLET | Freq: Every day | ORAL | 3 refills | Status: DC

## 2023-05-02 MED ORDER — DIVALPROEX SODIUM 500 MG PO DR TAB: 500.0000 mg | DELAYED_RELEASE_TABLET | Freq: Two times a day (BID) | ORAL | 3 refills | Status: DC

## 2023-05-02 NOTE — Care Management Important Message (Signed)
Important Message  Patient Details  Name: Todd [name]. MRN: [id number] Date of Birth: 1955/10/20   Medicare Important Message Given:  Yes     Laretta Alstrom 05/02/2023, 10:19 AM

## 2023-05-02 NOTE — [facility] Suicide Risk Assessment (Signed)
[facility] Discharge Suicide Risk Assessment   Principal Problem: Bipolar I disorder, most recent episode depressed (HCC) Discharge Diagnoses: Principal Problem:   Bipolar I disorder, most recent episode depressed (HCC) Active Problems:   Suicidal ideations   Total Time spent with patient: 1 hour  Musculoskeletal: Strength & Muscle Tone: within normal limits Gait & Station: normal Patient leans: N/A  Psychiatric Specialty Exam  Presentation  General Appearance:  Appropriate for Environment  Eye Contact: Fair  Speech: Slow; Clear and Coherent  Speech Volume: Decreased  Handedness: Right   Mood and Affect  Mood: Anxious; Depressed; Hopeless  Duration of Depression Symptoms: No data recorded Affect: Congruent; Constricted; Restricted; Tearful   Thought Process  Thought Processes: Coherent; Goal Directed  Descriptions of Associations:Intact  Orientation:Full (Time, Place and Person)  Thought Content:Abstract Reasoning  History of Schizophrenia/Schizoaffective disorder:No data recorded Duration of Psychotic Symptoms:No data recorded Hallucinations:No data recorded Ideas of Reference:None  Suicidal Thoughts:No data recorded Homicidal Thoughts:No data recorded  Sensorium  Memory: Immediate Fair; Recent Fair  Judgment: Impaired  Insight: Shallow   Executive Functions  Concentration: Fair  Attention Span: Fair  Recall: [employer] of Knowledge: Fair  Language: Fair   Psychomotor Activity  Psychomotor Activity:No data recorded  Assets  Assets: Communication Skills; Desire for Improvement; Housing   Sleep  Sleep:No data recorded  Physical Exam: Physical Exam ROS Blood pressure 106/67, pulse 69, temperature 97.9 F (36.6 C), resp. rate 17, height 5\' 9"  (1.753 m), weight 92.5 kg, SpO2 93%. Body mass index is 30.13 kg/m.  Mental Status Per Nursing Assessment::   On Admission:  NA  Demographic Factors:  Male and Age 67 or  older  Loss Factors: NA  Historical Factors: NA  Risk Reduction Factors:   NA   Cognitive Features That Contribute To Risk:  None    Suicide Risk:  Minimal: No identifiable suicidal ideation.  Patients presenting with no risk factors but with morbid ruminations; may be classified as minimal risk based on the severity of the depressive symptoms   Follow-up Information     [facility]. Call on 05/06/2023.   Why: You are scheduled for a hospital follow-up appointment on 05/06/23 at 1:45pm and it will be a virtual appointment. Contact information: [address]  [address] [place] [address] [phone]                 Plan Of Care/Follow-up recommendations: Vianne Bulls, DO 05/02/2023, 10:56 AM

## 2023-05-02 NOTE — [facility] Suicide Risk Assessment (Signed)
[facility] INPATIENT:  Family/Significant Other Suicide Prevention Education  Suicide Prevention Education:  Patient Refusal for Family/Significant Other Suicide Prevention Education: The patient Todd [name]. has refused to provide written consent for family/significant other to be provided Family/Significant Other Suicide Prevention Education during admission and/or prior to discharge.  Physician notified.  Elza Rafter 05/02/2023, 10:19 AM

## 2023-05-02 NOTE — Progress Notes (Signed)
   05/02/23 1100  Spiritual Encounters  Reason for visit Routine spiritual support  OnCall Visit No  Spiritual Framework  Presenting Themes Caregiving needs;Coping tools;Courage hope and growth  Community/Connection Family  Patient Stress Factors Health changes;Family relationships  Family Stress Factors None identified  Interventions  Spiritual Care Interventions Made Self-care teaching;Encouragement  Intervention Outcomes  Outcomes Awareness of support;Awareness of health;Reduced fear  Spiritual Care Plan  Spiritual Care Issues Still Outstanding No further spiritual care needs at this time (see row info)   Patient visited patient while rounding. Patient was sitting in the dayroom and told the chaplain he was being discharged. Patient stated that he is happy to be leaving but that he is nervous about being on his own again. Patient thinks as long as he stays on his medication and keep praying that he will be fine. Chaplain offered the patient words of encouragement and hope.

## 2023-05-02 NOTE — Progress Notes (Signed)
  [facility] Adult Case Management Discharge Plan :  Will you be returning to the same living situation after discharge:  Yes,  pt will be returning home  At discharge, do you have transportation home?: Yes,  CSW will provide taxi  Do you have the ability to pay for your medications: Yes,  HU[employer] MEDICARE / HUMANA MEDICARE HMO  Release of information consent forms completed and in the chart;  Patient's signature needed at discharge.  Patient to Follow up at:  Follow-up Information     [facility]. Call on 05/06/2023.   Why: You are scheduled for a hospital follow-up appointment on 05/06/23 at 1:45pm and it will be a virtual appointment. Contact information: [address]  [address] [place] [address] [phone]                 Next level of care provider has access to [facility] Link:no  Safety Planning and Suicide Prevention discussed: Yes,  although pt declined SPE with family member/friend, SPE brochure gone over with pt     Has patient been referred to the Quitline?: Patient refused referral for treatment  Patient has been referred for addiction treatment: No known substance use disorder.  [address], LCSWA 05/02/2023, 10:40 AM

## 2023-05-02 NOTE — Progress Notes (Signed)
   05/01/23 2100  Psych Admission Type (Psych Patients Only)  Admission Status Voluntary  Psychosocial Assessment  Patient Complaints Anxiety;Worrying;Depression  Eye Contact Fair  Facial Expression Anxious  Affect Anxious  Speech Logical/coherent  Interaction Assertive;Needy  Motor Activity Slow;Other (Comment) (uses wheelchair to get around d/t chronic pain in knees)  Appearance/Hygiene Disheveled  Behavior Characteristics Cooperative;Appropriate to situation;Anxious  Mood Anxious;Pleasant  Thought Process  Coherency WDL  Content WDL  Delusions None reported or observed  Perception WDL  Hallucination None reported or observed  Judgment WDL  Confusion None  Danger to Self  Current suicidal ideation? Denies  Danger to Others  Danger to Others None reported or observed   Progress note   D: Pt seen in dayroom interacting with other patients. Pt denies SI, HI, AVH. Pt rates pain  10/10 as chronic pain in both knees. 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 It's bone on bone. I have an appointment in November with the surgeon to have my left knee replaced." Pt rates anxiety and depression are high. "I know I am leaving tomorrow. Do I think I'm ready? No. But, I have to put my big boy pants on. I've got bills to pay and stuff to handle. I'm scared, I admit that. I know if I stay on my medicine then I have a fighting chance." Pt eating and sleeping well. Attending groups. No other concerns noted at this time.  A: Pt provided support and encouragement. Pt given scheduled medication as prescribed. PRNs as appropriate. Q15 min checks for safety.   R: Pt safe on the unit. Will continue to monitor.

## 2023-05-02 NOTE — Discharge Summary (Signed)
Physician Discharge Summary Note  Patient:  Todd [name]. is an 67 y.o., male MRN:  [id number] DOB:  July 18, 1956 Patient phone:  [phone] (home)  Patient address:   [address] [place] [address],  Total Time spent with patient: 1 hour  Date of Admission:  04/20/2023 Date of Discharge: 05/02/2023  Reason for Admission:   Todd [name]. is a 67 y.o. male patient presents to the [facility] emergency department complaining of bilateral knee pain and depression Today during assessment patient was tearful.  Patient reports history of bipolar disorder.  He is not able to recall the name of mood stabilizer he has been on.  Noted that patient was transferred from [facility] on 3 antidepressant.  Will discontinue Wellbutrin secondary to diagnosis of bipolar disorder.  Will continue on Zoloft low-dose and trazodone.  Patient also reports of anxiety.  Patient reports that he has been feeling helpless and hopeless, he has anhedonia, his sleep and appetite has been poor.  Patient reports that he has been getting into conflicts with his brother which is "aggravating".  Patient said that at times he feel like "running into a car". Patient also shared that he lost his father and sister.  Patient said that they passed away 2 weeks apart.  Patient reports that since he lost his father and sister his life has been "not right".  atient was provided with support and reassurance.Patient has experienced manic symptoms in the past.  He denies any manic symptoms today.  He denies auditory visual hallucination.  He denies any intention to harm himself on the unit.  He denies homicidal ideations.  Patient was encouraged to attend group and work on a safe discharge plan.  Principal Problem: Bipolar I disorder, most recent episode depressed [facility]) Discharge Diagnoses: Principal Problem:   Bipolar I disorder, most recent episode depressed (HCC) Active Problems:   Suicidal ideations   Past Psychiatric  History: Patient reports history of bipolar disorder reportedly patient has history of alcohol dependence, opiate and cocaine dependence.  UDS not done at [facility] ER.   Past Medical History:  Past Medical History:  Diagnosis Date   Arthritis    "fingers" (07/14/2018)   Depression    DVT (deep venous thrombosis) (HCC) LLE   Hepatitis C     finished harvoni tx ~ 2017   Hypercholesterolemia    Hypertension    Prostate cancer (HCC) 24yrs ago   Pulmonary embolism and infarction (HCC) 07/13/2018   Sleep apnea    not currently using cpap, mask causing vertigo   Type II diabetes mellitus (HCC)     Past Surgical History:  Procedure Laterality Date   BIOPSY  01/12/2019   Procedure: BIOPSY;  Surgeon: Bernette Redbird, MD;  Location: [facility] ENDOSCOPY;  Service: Endoscopy;;   BIOPSY  06/23/2019   Procedure: BIOPSY;  Surgeon: Willis Modena, MD;  Location: [facility] ENDOSCOPY;  Service: Endoscopy;;   COLONOSCOPY WITH PROPOFOL N/A 02/19/2014   Procedure: COLONOSCOPY WITH PROPOFOL;  Surgeon: Charolett Bumpers, MD;  Location: [facility] ENDOSCOPY;  Service: Endoscopy;  Laterality: N/A;   ENDOVENOUS ABLATION SAPHENOUS VEIN W/ LASER Left 11/22/2017   endovenous laser ablation L SSV by Josephina Gip MD    ESOPHAGEAL BRUSHING  06/23/2019   Procedure: ESOPHAGEAL BRUSHING;  Surgeon: Willis Modena, MD;  Location: [facility] ENDOSCOPY;  Service: Endoscopy;;   ESOPHAGOGASTRODUODENOSCOPY (EGD) WITH PROPOFOL N/A 01/12/2019   Procedure: ESOPHAGOGASTRODUODENOSCOPY (EGD) WITH PROPOFOL;  Surgeon: Bernette Redbird, MD;  Location: [facility] ENDOSCOPY;  Service: Endoscopy;  Laterality: N/A;  Patient is also scheduled for barium swallow; please notify radiology after patient's EGD is complete so that barium swallow follows the endoscopy, not vice versa   ESOPHAGOGASTRODUODENOSCOPY (EGD) WITH PROPOFOL N/A 06/23/2019   Procedure: ESOPHAGOGASTRODUODENOSCOPY (EGD) WITH PROPOFOL;  Surgeon: Willis Modena, MD;  Location: [facility] ENDOSCOPY;  Service: Endoscopy;   Laterality: N/A;   PROSTATECTOMY  2008   REPAIR QUADRICEPS / HAMSTRING MUSCLE Right    Family History:  Family History  Problem Relation Age of Onset   Cancer Father        PROSTATE   Family Psychiatric  History: Unremarkable Social History:  Social History   Substance and Sexual Activity  Alcohol Use Yes     Social History   Substance and Sexual Activity  Drug Use Yes    Social History   Socioeconomic History   Marital status: Single    Spouse name: Not on file   Number of children: 2   Years of education: 12th   Highest education level: Not on file  Occupational History   Occupation: post office  Tobacco Use   Smoking status: Every Day    Current packs/day: 0.12    Average packs/day: 0.1 packs/day for 45.0 years (5.4 ttl pk-yrs)    Types: Cigarettes   Smokeless tobacco: Never  Vaping Use   Vaping status: Never Used  Substance and Sexual Activity   Alcohol use: Yes   Drug use: Yes   Sexual activity: Not Currently  Other Topics Concern   Not on file  Social History Narrative   ** Merged History Encounter **       Patient lives at home alone.Marland KitchenMarland KitchenDrinks coffee daily    Social Determinants of Health   Financial Resource Strain: Low Risk  (10/27/2022)   Received from [facility]   Overall Financial Resource Strain (CARDIA)    Difficulty of Paying Living Expenses: Not very hard  Food Insecurity: No Food Insecurity (04/20/2023)   Hunger Vital Sign    Worried About Running Out of Food in the Last Year: Never true    Ran Out of Food in the Last Year: Never true  Transportation Needs: No Transportation Needs (04/20/2023)   PRAPARE - Administrator, Civil Service (Medical): No    Lack of Transportation (Non-Medical): No  Physical Activity: Inactive (10/27/2022)   Received from [facility]   Exercise Vital Sign    Days of Exercise per Week: 0 days    Minutes of Exercise per Session: 0 min  Stress: Stress Concern Present (10/27/2022)   Received  from [facility] of Occupational Health - Occupational Stress Questionnaire    Feeling of Stress : To some extent  Social Connections: Socially Isolated (10/27/2022)   Received from [facility]   Social Connection and Isolation Panel [NHANES]    Frequency of Communication with Friends and Family: More than three times a week    Frequency of Social Gatherings with Friends and Family: More than three times a week    Attends Religious Services: Never    Database administrator or Organizations: No    Attends Banker Meetings: Never    Marital Status: Divorced    Hospital Course: Cherre Huger is a 67 year old African-American male who is voluntarily admitted to inpatient psychiatry for worsening depression and suicidal ideation.  He was in a boardinghouse with 2 other roommates in [place].  He has not been on his medications and he  was placed back on his mood stabilizer, Depakote.  Zoloft was added and titrated up to 150 mg/day.  He did well with the medications.  He was placed back on his blood pressure medicine.  He was pleasant and cooperative throughout his hospitalization.  He participated in groups.  He denied any side effects from his medications.  He was no longer suicidal and was felt that he maximized hospitalization he was discharged home.  On the day of discharge he denied suicidal ideation, homicidal ideation, auditory or visual hallucinations.  His judgment and insight were good.  Physical Findings: AIMS:  , ,  ,  ,    CIWA:    COWS:     Musculoskeletal: Strength & Muscle Tone: within normal limits Gait & Station: unable to stand Patient leans: N/A   Psychiatric Specialty Exam:  Presentation  General Appearance:  Appropriate for Environment  Eye Contact: Fair  Speech: Slow; Clear and Coherent  Speech Volume: Decreased  Handedness: Right   Mood and Affect  Mood: Anxious; Depressed; Hopeless  Affect: Congruent; Constricted;  Restricted; Tearful   Thought Process  Thought Processes: Coherent; Goal Directed  Descriptions of Associations:Intact  Orientation:Full (Time, Place and Person)  Thought Content:Abstract Reasoning  History of Schizophrenia/Schizoaffective disorder:No data recorded Duration of Psychotic Symptoms:No data recorded Hallucinations:No data recorded Ideas of Reference:None  Suicidal Thoughts:No data recorded Homicidal Thoughts:No data recorded  Sensorium  Memory: Immediate Fair; Recent Fair  Judgment: Impaired  Insight: Shallow   Executive Functions  Concentration: Fair  Attention Span: Fair  Recall: [employer] of Knowledge: Fair  Language: Fair   Psychomotor Activity  Psychomotor Activity:No data recorded  Assets  Assets: Communication Skills; Desire for Improvement; Housing   Sleep  Sleep:No data recorded   Physical Exam: Physical Exam Vitals and nursing note reviewed.  Constitutional:      Appearance: Normal appearance. He is normal weight.  Neurological:     General: No focal deficit present.     Mental Status: He is alert and oriented to person, place, and time.  Psychiatric:        Attention and Perception: Attention and perception normal.        Mood and Affect: Mood and affect normal.        Speech: Speech normal.        Behavior: Behavior normal. Behavior is cooperative.        Thought Content: Thought content normal.        Cognition and Memory: Cognition and memory normal.        Judgment: Judgment normal.    Review of Systems  Constitutional: Negative.   HENT: Negative.    Eyes: Negative.   Respiratory: Negative.    Cardiovascular: Negative.   Gastrointestinal: Negative.   Genitourinary: Negative.   Musculoskeletal: Negative.   Skin: Negative.   Neurological: Negative.   Endo/Heme/Allergies: Negative.   Psychiatric/Behavioral: Negative.     Blood pressure 106/67, pulse 69, temperature 97.9 F (36.6 C), resp. rate 17,  height 5\' 9"  (1.753 m), weight 92.5 kg, SpO2 93%. Body mass index is 30.13 kg/m.   Social History   Tobacco Use  Smoking Status Every Day   Current packs/day: 0.12   Average packs/day: 0.1 packs/day for 45.0 years (5.4 ttl pk-yrs)   Types: Cigarettes  Smokeless Tobacco Never   Tobacco Cessation:  A prescription for an FDA-approved tobacco cessation medication was offered at discharge and the patient refused   Blood Alcohol level:  Lab Results  Component Value  Date   ETH <10 04/20/2023   ETH <10 02/15/2022    Metabolic Disorder Labs:  Lab Results  Component Value Date   HGBA1C 5.8 (H) 09/02/2021   MPG 119.76 09/02/2021   MPG 111.15 09/03/2020   No results found for: "PROLACTIN" Lab Results  Component Value Date   CHOL  03/11/2009    128        ATP III CLASSIFICATION:  <200     mg/dL   Desirable  454-098  mg/dL   Borderline High  >=119    mg/dL   High          TRIG 59 03/11/2009   HDL 45 03/11/2009   CHOLHDL 2.8 03/11/2009   VLDL 12 03/11/2009   LDLCALC  03/11/2009    71        Total Cholesterol/HDL:CHD Risk Coronary Heart Disease Risk Table                     Men   Women  1/2 Average Risk   3.4   3.3  Average Risk       5.0   4.4  2 X Average Risk   9.6   7.1  3 X Average Risk  23.4   11.0        Use the calculated Patient Ratio above and the CHD Risk Table to determine the patient's CHD Risk.        ATP III CLASSIFICATION (LDL):  <100     mg/dL   Optimal  147-829  mg/dL   Near or Above                    Optimal  130-159  mg/dL   Borderline  562-130  mg/dL   High  >865     mg/dL   Very High    See Psychiatric Specialty Exam and Suicide Risk Assessment completed by Attending Physician prior to discharge.  Discharge destination:  Home  Is patient on multiple antipsychotic therapies at discharge:  No   Has Patient had three or more failed trials of antipsychotic monotherapy by history:  No  Recommended Plan for Multiple Antipsychotic  Therapies: NA   Allergies as of 05/02/2023   No Known Allergies      Medication List     STOP taking these medications    Acetaminophen Extra Strength 500 MG Tabs   HYDROcodone-acetaminophen 10-325 MG tablet Commonly known as: NORCO   hydrOXYzine 50 MG tablet Commonly known as: ATARAX       TAKE these medications      Indication  amLODipine 5 MG tablet Commonly known as: NORVASC Take 1 tablet (5 mg total) by mouth daily.  Indication: High Blood Pressure   busPIRone 10 MG tablet Commonly known as: BUSPAR Take 1 tablet (10 mg total) by mouth 2 (two) times daily.  Indication: Anxiety Disorder, Major Depressive Disorder   divalproex 500 MG DR tablet Commonly known as: DEPAKOTE Take 1 tablet (500 mg total) by mouth 2 (two) times daily.  Indication: Depressive Phase of Manic-Depression   sertraline 50 MG tablet Commonly known as: ZOLOFT Take 3 tablets (150 mg total) by mouth daily. Start taking on: May 03, 2023 What changed:  medication strength how much to take  Indication: Major Depressive Disorder   traZODone 100 MG tablet Commonly known as: DESYREL Take 2 tablets (200 mg total) by mouth at bedtime. What changed: how much to take  Indication: Trouble Sleeping, Major Depressive Disorder  Follow-up Information     [facility]. Call on 05/06/2023.   Why: You are scheduled for a hospital follow-up appointment on 05/06/23 at 1:45pm and it will be a virtual appointment. Contact information: [address]  [address] [place] [address] [phone]                 Follow-up recommendations:  [facility]    Signed: Sarina Ill, DO 05/02/2023, 11:03 AM

## 2023-05-02 NOTE — Plan of Care (Signed)
Problem: Education: Goal: Knowledge of General Education information will improve Description: Including pain rating scale, medication(s)/side effects and non-pharmacologic comfort measures Outcome: Adequate for Discharge   Problem: Health Behavior/Discharge Planning: Goal: Ability to manage health-related needs will improve Outcome: Adequate for Discharge   Problem: Clinical Measurements: Goal: Ability to maintain clinical measurements within normal limits will improve Outcome: Adequate for Discharge Goal: Will remain free from infection Outcome: Adequate for Discharge Goal: Diagnostic test results will improve Outcome: Adequate for Discharge Goal: Respiratory complications will improve Outcome: Adequate for Discharge Goal: Cardiovascular complication will be avoided Outcome: Adequate for Discharge   Problem: Activity: Goal: Risk for activity intolerance will decrease Outcome: Adequate for Discharge   Problem: Nutrition: Goal: Adequate nutrition will be maintained Outcome: Adequate for Discharge   Problem: Coping: Goal: Level of anxiety will decrease Outcome: Adequate for Discharge   Problem: Elimination: Goal: Will not experience complications related to bowel motility Outcome: Adequate for Discharge Goal: Will not experience complications related to urinary retention Outcome: Adequate for Discharge   Problem: Pain Managment: Goal: General experience of comfort will improve Outcome: Adequate for Discharge   Problem: Safety: Goal: Ability to remain free from injury will improve Outcome: Adequate for Discharge   Problem: Skin Integrity: Goal: Risk for impaired skin integrity will decrease Outcome: Adequate for Discharge   Problem: Education: Goal: Knowledge of  General Education information/materials will improve Outcome: Adequate for Discharge Goal: Emotional status will improve Outcome: Adequate for Discharge Goal: Mental status will  improve Outcome: Adequate for Discharge Goal: Verbalization of understanding the information provided will improve Outcome: Adequate for Discharge   Problem: Activity: Goal: Interest or engagement in activities will improve Outcome: Adequate for Discharge Goal: Sleeping patterns will improve Outcome: Adequate for Discharge   Problem: Coping: Goal: Ability to verbalize frustrations and anger appropriately will improve Outcome: Adequate for Discharge Goal: Ability to demonstrate self-control will improve Outcome: Adequate for Discharge   Problem: Health Behavior/Discharge Planning: Goal: Identification of resources available to assist in meeting health care needs will improve Outcome: Adequate for Discharge Goal: Compliance with treatment plan for underlying cause of condition will improve Outcome: Adequate for Discharge   Problem: Physical Regulation: Goal: Ability to maintain clinical measurements within normal limits will improve Outcome: Adequate for Discharge   Problem: Safety: Goal: Periods of time without injury will increase Outcome: Adequate for Discharge   Problem: Education: Goal: Ability to make informed decisions regarding treatment will improve Outcome: Adequate for Discharge   Problem: Coping: Goal: Coping ability will improve Outcome: Adequate for Discharge   Problem: Health Behavior/Discharge Planning: Goal: Identification of resources available to assist in meeting health care needs will improve Outcome: Adequate for Discharge   Problem: Medication: Goal: Compliance with prescribed medication regimen will improve Outcome: Adequate for Discharge   Problem: Self-Concept: Goal: Ability to disclose and discuss suicidal ideas will improve Outcome: Adequate for Discharge Goal: Will verbalize positive feelings about self Outcome: Adequate for Discharge   Problem: Education: Goal: Utilization of techniques to improve thought processes will  improve Outcome: Adequate for Discharge Goal: Knowledge of the prescribed therapeutic regimen will improve Outcome: Adequate for Discharge   Problem: Activity: Goal: Interest or engagement in leisure activities will improve Outcome: Adequate for Discharge Goal: Imbalance in normal sleep/wake cycle will improve Outcome: Adequate for Discharge   Problem: Coping: Goal: Coping ability will improve Outcome: Adequate for Discharge Goal: Will verbalize feelings Outcome: Adequate for Discharge   Problem: Health Behavior/Discharge Planning: Goal: Ability to make decisions will improve  Outcome: Adequate for Discharge Goal: Compliance with therapeutic regimen will improve Outcome: Adequate for Discharge   Problem: Role Relationship: Goal: Will demonstrate positive changes in social behaviors and relationships Outcome: Adequate for Discharge   Problem: Safety: Goal: Ability to disclose and discuss suicidal ideas will improve Outcome: Adequate for Discharge Goal: Ability to identify and utilize support systems that promote safety will improve Outcome: Adequate for Discharge   Problem: Self-Concept: Goal: Will verbalize positive feelings about self Outcome: Adequate for Discharge Goal: Level of anxiety will decrease Outcome: Adequate for Discharge

## 2023-05-02 NOTE — Progress Notes (Signed)
   05/02/23 0554  15 Minute Checks  Location Bedroom  Visual Appearance Calm  Behavior Sleeping  Sleep (Behavioral Health Patients Only)  Calculate sleep? (Click Yes once per 24 hr at 0600 safety check) Yes  Documented sleep last 24 hours 6

## 2023-05-02 NOTE — Plan of Care (Signed)
  Problem: Health Behavior/Discharge Planning: Goal: Ability to manage health-related needs will improve Outcome: Progressing   Problem: Clinical Measurements: Goal: Ability to maintain clinical measurements within normal limits will improve Outcome: Progressing Goal: Will remain free from infection Outcome: Progressing Goal: Diagnostic test results will improve Outcome: Progressing Goal: Respiratory complications will improve Outcome: Progressing Goal: Cardiovascular complication will be avoided Outcome: Progressing   Problem: Nutrition: Goal: Adequate nutrition will be maintained Outcome: Progressing   Problem: Coping: Goal: Level of anxiety will decrease Outcome: Progressing   Problem: Safety: Goal: Ability to remain free from injury will improve Outcome: Progressing   Problem: Skin Integrity: Goal: Risk for impaired skin integrity will decrease Outcome: Progressing

## 2023-05-02 NOTE — Group Note (Signed)
Date:  05/02/2023 Time:  9:58 AM  Group Topic/Focus:  Making Healthy Choices:   The focus of this group is to help patients identify negative/unhealthy choices they were using prior to admission and identify positive/healthier coping strategies to replace them upon discharge.    Participation Level:  Did Not Attend    Rodena Goldmann 05/02/2023, 9:58 AM

## 2023-05-02 NOTE — Progress Notes (Signed)
   05/02/23 0732  Psych Admission Type (Psych Patients Only)  Admission Status Voluntary  Psychosocial Assessment  Patient Complaints None  Eye Contact Fair  Facial Expression Animated  Affect Silly  Speech Logical/coherent  Interaction Assertive;Needy  Motor Activity Slow  Appearance/Hygiene Body odor;Disheveled  Behavior Characteristics Cooperative  Mood Helpless  Thought Process  Coherency WDL  Content WDL  Delusions None reported or observed  Perception WDL  Hallucination None reported or observed  Judgment WDL  Confusion None  Danger to Self  Current suicidal ideation? Denies

## 2023-05-02 NOTE — Progress Notes (Signed)
Patient alert and oriented x 4. Affect is pleasant and calm. Denies anxiety, SI/HI or AVH.  Patient states they will try to keep themselves safe when they return home.  Reviewed discharge instructions with patient including follow up appointment with provider, medication and prescriptions.  Questions answered and understanding verbalized.  Discharge packet given.  All belongings returned to patient after verification completed by staff.    Patient escorted by staff off unit at this time, upon staff return, MHT states that patient was rude and aggressive trying to grab his collar on the way to the car.

## 2023-05-11 ENCOUNTER — Other Ambulatory Visit: Payer: Self-pay

## 2023-05-11 ENCOUNTER — Emergency Department (HOSPITAL_COMMUNITY): Payer: Medicare HMO

## 2023-05-11 ENCOUNTER — Emergency Department (HOSPITAL_COMMUNITY)
Admission: EM | Admit: 2023-05-11 | Discharge: 2023-05-11 | Disposition: A | Discharge status: Home / Self Care | Payer: Medicare HMO | Attending: Emergency Medicine | Admitting: Emergency Medicine

## 2023-05-11 DIAGNOSIS — E119 Type 2 diabetes mellitus without complications: Secondary | ICD-10-CM | POA: Insufficient documentation

## 2023-05-11 DIAGNOSIS — Z79899 Other long term (current) drug therapy: Secondary | ICD-10-CM | POA: Diagnosis not present

## 2023-05-11 DIAGNOSIS — Z8546 Personal history of malignant neoplasm of prostate: Secondary | ICD-10-CM | POA: Insufficient documentation

## 2023-05-11 DIAGNOSIS — M25561 Pain in right knee: Secondary | ICD-10-CM | POA: Insufficient documentation

## 2023-05-11 DIAGNOSIS — I10 Essential (primary) hypertension: Secondary | ICD-10-CM | POA: Insufficient documentation

## 2023-05-11 LAB — CBC WITH DIFFERENTIAL/PLATELET
Abs Immature Granulocytes: 0.01 10*3/uL (ref 0.00–0.07)
Basophils Absolute: 0.1 10*3/uL (ref 0.0–0.1)
Basophils Relative: 1 %
Eosinophils Absolute: 0.1 10*3/uL (ref 0.0–0.5)
Eosinophils Relative: 2 %
HCT: 46.6 % (ref 39.0–52.0)
Hemoglobin: 14.9 g/dL (ref 13.0–17.0)
Immature Granulocytes: 0 %
Lymphocytes Relative: 27 %
Lymphs Abs: 1.9 10*3/uL (ref 0.7–4.0)
MCH: 31.4 pg (ref 26.0–34.0)
MCHC: 32 g/dL (ref 30.0–36.0)
MCV: 98.3 fL (ref 80.0–100.0)
Monocytes Absolute: 0.7 10*3/uL (ref 0.1–1.0)
Monocytes Relative: 10 %
Neutro Abs: 4.3 10*3/uL (ref 1.7–7.7)
Neutrophils Relative %: 60 %
Platelets: 220 10*3/uL (ref 150–400)
RBC: 4.74 MIL/uL (ref 4.22–5.81)
RDW: 13.4 % (ref 11.5–15.5)
WBC: 7.1 10*3/uL (ref 4.0–10.5)
nRBC: 0 % (ref 0.0–0.2)

## 2023-05-11 LAB — BASIC METABOLIC PANEL
Anion gap: 10 (ref 5–15)
BUN: 20 mg/dL (ref 8–23)
CO2: 22 mmol/L (ref 22–32)
Calcium: 9.4 mg/dL (ref 8.9–10.3)
Chloride: 105 mmol/L (ref 98–111)
Creatinine, Ser: 1.09 mg/dL (ref 0.61–1.24)
GFR, Estimated: >60 mL/min (ref >60)
Glucose, Bld: 81 mg/dL (ref 70–99)
Potassium: 4.2 mmol/L (ref 3.5–5.1)
Sodium: 137 mmol/L (ref 135–145)

## 2023-05-11 LAB — SYNOVIAL CELL COUNT + DIFF, W/ CRYSTALS
Eosinophils-Synovial: 0 % (ref 0–1)
Lymphocytes-Synovial Fld: 0 % (ref 0–20)
Monocyte-Macrophage-Synovial Fluid: 8 % — ABNORMAL LOW (ref 50–90)
Neutrophil, Synovial: 92 % — ABNORMAL HIGH (ref 0–25)
WBC, Synovial: 758 /mm3 — ABNORMAL HIGH (ref 0–200)

## 2023-05-11 MED ORDER — MORPHINE SULFATE (PF) 4 MG/ML IV SOLN: 4.0000 mg | Freq: Once | INTRAVENOUS | Status: DC

## 2023-05-11 MED ORDER — HYDROCODONE-ACETAMINOPHEN 5-325 MG PO TABS: 1.0000 | ORAL_TABLET | Freq: Four times a day (QID) | ORAL | 0 refills | Status: DC | PRN

## 2023-05-11 MED ORDER — NAPROXEN 500 MG PO TABS
500.0000 mg | ORAL_TABLET | Freq: Once | ORAL | Status: AC
  Administered 2023-05-11: 500 mg via ORAL
  Filled 2023-05-11: qty 1

## 2023-05-11 MED ORDER — LIDOCAINE HCL (PF) 1 % IJ SOLN
5.0000 mL | Freq: Once | INTRAMUSCULAR | Status: AC
  Administered 2023-05-11: 5 mL
  Filled 2023-05-11: qty 30

## 2023-05-11 MED ORDER — MORPHINE SULFATE (PF) 4 MG/ML IV SOLN
4.0000 mg | Freq: Once | INTRAVENOUS | Status: AC
  Administered 2023-05-11: 4 mg via INTRAVENOUS
  Filled 2023-05-11: qty 1

## 2023-05-11 MED ORDER — NAPROXEN 500 MG PO TABS: 500.0000 mg | ORAL_TABLET | Freq: Two times a day (BID) | ORAL | 0 refills | Status: AC

## 2023-05-11 MED ORDER — HYDROCODONE-ACETAMINOPHEN 5-325 MG PO TABS
1.0000 | ORAL_TABLET | Freq: Once | ORAL | Status: AC
  Administered 2023-05-11: 1 via ORAL
  Filled 2023-05-11: qty 1

## 2023-05-11 MED ORDER — NAPROXEN 500 MG PO TABS: 500.0000 mg | ORAL_TABLET | Freq: Two times a day (BID) | ORAL | 0 refills | Status: DC

## 2023-05-11 NOTE — ED Provider Notes (Signed)
[place] EMERGENCY DEPARTMENT AT [facility] Provider Note   CSN: [id number] Arrival date & time: 05/11/23  1552     History  Chief Complaint  Patient presents with   Knee Pain    Todd [name]. is a 67 y.o. male with a past medical history significant for hypertension, history of DVT, history of prostate cancer, diabetes, depression, history of hepatitis C who presents to the ED due to right knee pain x 1 day.  Patient states pain started yesterday.  He admits to significant edema and erythema this morning.  No injury.  Has chronic bilateral knee pain.  Notes it feels different than his chronic pain.  No fever or chills.  No concern for STIs.  Has not been sexually active in numerous years.  No penile discharge.  He has a scheduled appointment with orthopedics in a few weeks for injection in left knee due to chronic pain.  No history of gout.  History obtained from patient and past medical records. No interpreter used during encounter.       Home Medications Prior to Admission medications   Medication Sig Start Date End Date Taking? Authorizing Provider  HYDROcodone-acetaminophen (NORCO/VICODIN) 5-325 MG tablet Take 1 tablet by mouth every 6 (six) hours as needed. 05/11/23  Yes Madge Therrien C, PA-C  naproxen (NAPROSYN) 500 MG tablet Take 1 tablet (500 mg total) by mouth 2 (two) times daily for 3 days. 05/11/23 05/14/23 Yes Mozella Rexrode, Merla Riches, PA-C  amLODipine (NORVASC) 5 MG tablet Take 1 tablet (5 mg total) by mouth daily. 05/02/23 06/01/23  Sarina Ill, DO  busPIRone (BUSPAR) 10 MG tablet Take 1 tablet (10 mg total) by mouth 2 (two) times daily. 05/02/23   Sarina Ill, DO  divalproex (DEPAKOTE) 500 MG DR tablet Take 1 tablet (500 mg total) by mouth 2 (two) times daily. 05/02/23   Sarina Ill, DO  sertraline (ZOLOFT) 50 MG tablet Take 3 tablets (150 mg total) by mouth daily. 05/03/23   Sarina Ill, DO  traZODone (DESYREL)  100 MG tablet Take 2 tablets (200 mg total) by mouth at bedtime. 05/02/23   Sarina Ill, DO      Allergies    Patient has no known allergies.    Review of Systems   Review of Systems  Constitutional:  Negative for chills and fever.  Musculoskeletal:  Positive for arthralgias, gait problem and joint swelling.    Physical Exam Updated Vital Signs BP 129/87   Pulse 80   Temp 98 F (36.7 C)   Resp 20   Ht 5\' 9"  (1.753 m)   Wt 92.5 kg   SpO2 97%   BMI 30.11 kg/m  Physical Exam Vitals and nursing note reviewed.  Constitutional:      General: He is not in acute distress.    Appearance: He is not ill-appearing.  HENT:     Head: Normocephalic.  Eyes:     Pupils: Pupils are equal, round, and reactive to light.  Cardiovascular:     Rate and Rhythm: Normal rate and regular rhythm.     Pulses: Normal pulses.     Heart sounds: Normal heart sounds. No murmur heard.    No friction rub. No gallop.  Pulmonary:     Effort: Pulmonary effort is normal.     Breath sounds: Normal breath sounds.  Abdominal:     General: Abdomen is flat. There is no distension.     Palpations: Abdomen is soft.  Tenderness: There is no abdominal tenderness. There is no guarding or rebound.  Musculoskeletal:        General: Normal range of motion.     Cervical back: Neck supple.     Comments: RLE: no hip tenderness. Erythema and edema to right knee. Decreased flexion. Full extension. TTP throughout anterior aspect of right knee. Pedal pulses palpable.   Skin:    General: Skin is warm and dry.  Neurological:     General: No focal deficit present.     Mental Status: He is alert.  Psychiatric:        Mood and Affect: Mood normal.        Behavior: Behavior normal.     ED Results / Procedures / Treatments   Labs (all labs ordered are listed, but only abnormal results are displayed) Labs Reviewed  SYNOVIAL CELL COUNT + DIFF, W/ CRYSTALS - Abnormal; Notable for the following components:       Result Value   Color, Synovial YELLOW (*)    Appearance-Synovial TURBID (*)    WBC, Synovial 758 (*)    Neutrophil, Synovial 92 (*)    Monocyte-Macrophage-Synovial Fluid 8 (*)    All other components within normal limits  BODY FLUID CULTURE W GRAM STAIN  CBC WITH DIFFERENTIAL/PLATELET  BASIC METABOLIC PANEL  GLUCOSE, BODY FLUID OTHER            PROTEIN, BODY FLUID (OTHER)  URIC ACID, BODY FLUID    EKG None  Radiology DG Knee Complete 4 Views Right  Result Date: 05/11/2023 CLINICAL DATA:  Worsening knee pain EXAM: RIGHT KNEE - COMPLETE 4+ VIEW COMPARISON:  02/15/2022, 7 10/2021 FINDINGS: No fracture or malalignment. 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 Moderate to large knee effusion. Multiple linearly oriented calcifications which are chronic, previously thought to represent heterotopic ossification along the quadriceps tendon. IMPRESSION: 1. 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 2. At least moderate knee effusion Electronically Signed   By: Jasmine Pang M.D.   On: 05/11/2023 18:13    Procedures Procedures    Medications Ordered in ED Medications  HYDROcodone-acetaminophen (NORCO/VICODIN) 5-325 MG per tablet 1 tablet (1 tablet Oral Given 05/11/23 1656)  morphine (PF) 4 MG/ML injection 4 mg (4 mg Intravenous Given 05/11/23 1844)  lidocaine (PF) (XYLOCAINE) 1 % injection 5 mL (5 mLs Infiltration Given 05/11/23 1857)    ED Course/ Medical Decision Making/ A&P Clinical Course as of 05/11/23 2045  Wed May 11, 2023  1720 WBC: 7.1 [CA]  1720 Hemoglobin: 14.9 [CA]    Clinical Course User Index [CA] Mannie Stabile, PA-C                                 Medical Decision Making Amount and/or Complexity of Data Reviewed External Data Reviewed: notes. Labs: ordered. Decision-making details documented in ED Course. Radiology: ordered and independent interpretation performed. Decision-making details documented in ED  Course.  Risk Prescription drug management.   This patient presents to the ED for concern of knee pain, this involves an extensive number of treatment options, and is a complaint that carries with it a high risk of complications and morbidity.  The differential diagnosis includes arthritis, gout, septic joint, bursitis, etc  67 year old male presents to the ED due to right knee pain, erythema, and edema x 1 day.  History of chronic bilateral knee pain.  No fever or chills.  No penile discharge.  No concern for STIs.  Upon arrival patient afebrile, not tachycardic or hypoxic.  Patient in no acute distress.  Erythema and edema to right knee with tenderness throughout anterior right knee in suprapatellar region.  Full extension.  Decreased flexion.  Right lower extremity neurovascularly intact with soft compartments.  Low suspicion for compartment syndrome.  No calf tenderness or edema.  Low suspicion for DVT.  Routine labs ordered to check for significant leukocytosis.  X-ray ordered. Possible infected prepatellar bursa?? Lower suspicion for septic joint because most edema above joint.  CBC unremarkable.  No leukocytosis.  Normal hemoglobin.  BMP unremarkable.  Normal renal function.  No major electrolyte derangements.  Knee x-ray personally reviewed and interpreted which demonstrates tricompartment arthritis with chondrocalcinosis. Moderate knee effusion.  Aspirated bursa with help from Dr. Particia Nearing which demonstrated cloudy fluid. Fluid sent to lab for analysis to rule out infection  Synovial cell count with 758 white blood cells and urate crystals.  Suspect symptoms likely related to gout.  Patient discharged with naproxen x 3 days.  Hydrocodone as needed for severe pain.  Low suspicion for septic joint or infection. Patient stable for discharge. Strict ED precautions discussed with patient. Patient states understanding and agrees to plan. Patient discharged home in no acute distress and stable  vitals  Lives at home Has PCP Hx DM       Final Clinical Impression(s) / ED Diagnoses Final diagnoses:  Acute pain of right knee    Rx / DC Orders ED Discharge Orders          Ordered    HYDROcodone-acetaminophen (NORCO/VICODIN) 5-325 MG tablet  Every 6 hours PRN        05/11/23 2042    naproxen (NAPROSYN) 500 MG tablet  2 times daily        05/11/23 2042              Jesusita Oka 05/11/23 2052    Jacalyn Lefevre, MD 05/11/23 2337

## 2023-05-11 NOTE — ED Provider Notes (Signed)
  Physical Exam  BP 114/87   Pulse 87   Temp 97.8 F (36.6 C)   Resp 18   Ht 5\' 9"  (1.753 m)   Wt 92.5 kg   SpO2 96%   BMI 30.11 kg/m   Physical Exam  Procedures  .Joint Aspiration/Arthrocentesis  Date/Time: 05/11/2023 11:37 PM  Performed by: Jacalyn Lefevre, MD Authorized by: Jacalyn Lefevre, MD   Consent:    Consent obtained:  Verbal   Consent given by:  Patient   Risks discussed:  Bleeding and infection Universal protocol:    Patient identity confirmed:  Verbally with patient Location:    Location:  Knee Anesthesia:    Anesthesia method:  Local infiltration   Local anesthetic:  Lidocaine 2% WITH epi Procedure details:    Preparation: Patient was prepped and draped in usual sterile fashion     Approach:  Superior   Aspirate characteristics:  Cloudy   Steroid injected: no     Specimen collected: yes   Post-procedure details:    Dressing:  Adhesive bandage   Procedure completion:  Tolerated well, no immediate complications        Jacalyn Lefevre, MD 05/11/23 2338

## 2023-05-11 NOTE — ED Triage Notes (Signed)
Patient brought in by EMS due to 2 days of right knee pain. Swelling, tender to touch and red. Has had some issues in the past with this knee, but this is worse than his baseline. Unable to walk with his assistive devices today. OTC meds and trazodone with no relief.

## 2023-05-11 NOTE — Discharge Instructions (Addendum)
It was a pleasure taking care of you today. As discussed, your symptoms are likely related to gout based on your labs. I am sending you home with naproxen. Take for the next 3 days. I am also sending you home with hydrocodone. Take for severe pain.  Pain medication can cause drowsiness so do not drive or operate machinery while the medication.  Please follow-up with PCP in 2 to 3 days for a recheck.  Return to the ER for any new or worsening symptoms.

## 2023-05-12 LAB — BODY FLUID CULTURE W GRAM STAIN

## 2023-05-13 LAB — GLUCOSE, BODY FLUID OTHER: Glucose, Body Fluid Other: 62 mg/dL

## 2023-05-15 LAB — BODY FLUID CULTURE W GRAM STAIN: Culture: NO GROWTH

## 2023-05-16 LAB — PROTEIN, BODY FLUID (OTHER): Total Protein, Body Fluid Other: 2.9 g/dL

## 2023-05-17 LAB — URIC ACID, BODY FLUID: Uric Acid Body Fluid: 8.5 mg/dL

## 2023-08-08 ENCOUNTER — Emergency Department (HOSPITAL_COMMUNITY)
Admission: EM | Admit: 2023-08-08 | Discharge: 2023-08-11 | Disposition: A | Discharge status: Other Acute Inpatient Hospital | Payer: Medicare HMO | Attending: Emergency Medicine | Admitting: Emergency Medicine

## 2023-08-08 ENCOUNTER — Other Ambulatory Visit: Payer: Self-pay

## 2023-08-08 ENCOUNTER — Encounter (HOSPITAL_COMMUNITY): Payer: Self-pay

## 2023-08-08 DIAGNOSIS — R45851 Suicidal ideations: Secondary | ICD-10-CM | POA: Insufficient documentation

## 2023-08-08 DIAGNOSIS — I10 Essential (primary) hypertension: Secondary | ICD-10-CM | POA: Diagnosis not present

## 2023-08-08 DIAGNOSIS — Z8546 Personal history of malignant neoplasm of prostate: Secondary | ICD-10-CM | POA: Insufficient documentation

## 2023-08-08 DIAGNOSIS — E119 Type 2 diabetes mellitus without complications: Secondary | ICD-10-CM | POA: Insufficient documentation

## 2023-08-08 DIAGNOSIS — F3130 Bipolar disorder, current episode depressed, mild or moderate severity, unspecified: Secondary | ICD-10-CM | POA: Insufficient documentation

## 2023-08-08 DIAGNOSIS — F1920 Other psychoactive substance dependence, uncomplicated: Secondary | ICD-10-CM | POA: Insufficient documentation

## 2023-08-08 DIAGNOSIS — F32A Depression, unspecified: Secondary | ICD-10-CM | POA: Insufficient documentation

## 2023-08-08 DIAGNOSIS — F17210 Nicotine dependence, cigarettes, uncomplicated: Secondary | ICD-10-CM | POA: Diagnosis not present

## 2023-08-08 DIAGNOSIS — F1914 Other psychoactive substance abuse with psychoactive substance-induced mood disorder: Secondary | ICD-10-CM | POA: Insufficient documentation

## 2023-08-08 DIAGNOSIS — F332 Major depressive disorder, recurrent severe without psychotic features: Secondary | ICD-10-CM | POA: Diagnosis present

## 2023-08-08 LAB — RAPID URINE DRUG SCREEN, HOSP PERFORMED
Amphetamines: NOT DETECTED
Barbiturates: NOT DETECTED
Benzodiazepines: NOT DETECTED
Cocaine: POSITIVE — AB
Opiates: NOT DETECTED
Tetrahydrocannabinol: NOT DETECTED

## 2023-08-08 LAB — COMPREHENSIVE METABOLIC PANEL
ALT: 13 U/L (ref 0–44)
AST: 21 U/L (ref 15–41)
Albumin: 3.8 g/dL (ref 3.5–5.0)
Alkaline Phosphatase: 60 U/L (ref 38–126)
Anion gap: 11 (ref 5–15)
BUN: 15 mg/dL (ref 8–23)
CO2: 21 mmol/L — ABNORMAL LOW (ref 22–32)
Calcium: 9.3 mg/dL (ref 8.9–10.3)
Chloride: 107 mmol/L (ref 98–111)
Creatinine, Ser: 0.97 mg/dL (ref 0.61–1.24)
GFR, Estimated: >60 mL/min (ref >60)
Glucose, Bld: 81 mg/dL (ref 70–99)
Potassium: 3.7 mmol/L (ref 3.5–5.1)
Sodium: 139 mmol/L (ref 135–145)
Total Bilirubin: 1.2 mg/dL — ABNORMAL HIGH (ref <1.2)
Total Protein: 7.7 g/dL (ref 6.5–8.1)

## 2023-08-08 LAB — ACETAMINOPHEN LEVEL: Acetaminophen (Tylenol), Serum: <10 ug/mL — ABNORMAL LOW (ref 10–30)

## 2023-08-08 LAB — CBC
HCT: 48.5 % (ref 39.0–52.0)
Hemoglobin: 16 g/dL (ref 13.0–17.0)
MCH: 33.5 pg (ref 26.0–34.0)
MCHC: 33 g/dL (ref 30.0–36.0)
MCV: 101.5 fL — ABNORMAL HIGH (ref 80.0–100.0)
Platelets: 170 10*3/uL (ref 150–400)
RBC: 4.78 MIL/uL (ref 4.22–5.81)
RDW: 15 % (ref 11.5–15.5)
WBC: 4.5 10*3/uL (ref 4.0–10.5)
nRBC: 0 % (ref 0.0–0.2)

## 2023-08-08 LAB — ETHANOL: Alcohol, Ethyl (B): <10 mg/dL (ref <10)

## 2023-08-08 LAB — SALICYLATE LEVEL: Salicylate Lvl: <7 mg/dL — ABNORMAL LOW (ref 7.0–30.0)

## 2023-08-08 MED ORDER — BUSPIRONE HCL 10 MG PO TABS
10.0000 mg | ORAL_TABLET | Freq: Two times a day (BID) | ORAL | Status: DC
  Administered 2023-08-08 – 2023-08-11 (×6): 10 mg via ORAL
  Filled 2023-08-08 (×5): qty 1

## 2023-08-08 MED ORDER — SERTRALINE HCL 50 MG PO TABS
150.0000 mg | ORAL_TABLET | Freq: Every day | ORAL | Status: DC
  Administered 2023-08-09 – 2023-08-11 (×3): 150 mg via ORAL
  Filled 2023-08-08 (×2): qty 3

## 2023-08-08 MED ORDER — TRAZODONE HCL 100 MG PO TABS
200.0000 mg | ORAL_TABLET | Freq: Every day | ORAL | Status: DC
  Administered 2023-08-08 – 2023-08-10 (×3): 200 mg via ORAL
  Filled 2023-08-08 (×3): qty 2

## 2023-08-08 MED ORDER — IBUPROFEN 200 MG PO TABS
400.0000 mg | ORAL_TABLET | Freq: Once | ORAL | Status: AC
  Administered 2023-08-08: 400 mg via ORAL
  Filled 2023-08-08: qty 2

## 2023-08-08 MED ORDER — DIVALPROEX SODIUM 500 MG PO DR TAB
500.0000 mg | DELAYED_RELEASE_TABLET | Freq: Two times a day (BID) | ORAL | Status: DC
  Administered 2023-08-08 – 2023-08-11 (×6): 500 mg via ORAL
  Filled 2023-08-08 (×5): qty 1

## 2023-08-08 MED ORDER — AMLODIPINE BESYLATE 5 MG PO TABS
5.0000 mg | ORAL_TABLET | Freq: Every day | ORAL | Status: DC
  Administered 2023-08-08 – 2023-08-11 (×4): 5 mg via ORAL
  Filled 2023-08-08 (×3): qty 1

## 2023-08-08 MED ORDER — ACETAMINOPHEN 325 MG PO TABS
650.0000 mg | ORAL_TABLET | Freq: Once | ORAL | Status: AC
  Administered 2023-08-08: 650 mg via ORAL
  Filled 2023-08-08: qty 2

## 2023-08-08 NOTE — BH Assessment (Signed)
TTS Consult to be completed by IRIS. IRIS Coordinator will communicate in established secure chat provider name and assessment time.

## 2023-08-08 NOTE — ED Triage Notes (Addendum)
Patient BIB [facility] from home. Patient began having left knee pain similar to arthritis for 8 months. Unable to move around today. Now feels suicidal when EMS transported. Patient said he would walk in front of a car to end his life. Said the house he lives in has bed bugs and they dont feed the patient enough food. Ambulates with walker. Feels lonely and depressed.

## 2023-08-08 NOTE — ED Notes (Signed)
Pt was changed out into burgundy scrubs w/o incident and was assisted back to the stretcher from the wheelchair. Belongings were placed in pt belonging bag x2, labeled and placed in the cabinet at the triage rn station.

## 2023-08-08 NOTE — ED Provider Notes (Signed)
[name] AT [facility] Provider Note   CSN: [id number] Arrival date & time: 08/08/23  1616     History  Chief Complaint  Patient presents with   Suicidal    Todd [name]. is a 67 y.o. male.  The history is provided by the patient.   Presents to the ED with complaints of SI with plan.  He states that this has been going on for 4 months ever since he moved into a new assisted living home.  Previous medical history of type 2 diabetes, diverticulosis, opioid use disorder,heroin overdose, cocaine dependence, alcohol dependence, bipolar 1 disorder, SI, PE, ulcerative esophagitis.  However today he says he is at his "breaking point."  Previously on antianxiety, depression medication however has not taken medication in over 4 months due to the inability to find transportation.Endorses having done cocaine 4 days ago which she says he has not done for years indicating that he is at his lowest point.  Also endorses alcohol use 2 days ago where he drinks wine."  He states his plan is to jump in front of a car which he will do when he leaves here if not seeing by behavioral health.   Home Medications Prior to Admission medications   Medication Sig Start Date End Date Taking? Authorizing Provider  allopurinol (ZYLOPRIM) 100 MG tablet Take 200 mg by mouth daily. 07/06/23  Yes [provider]  busPIRone (BUSPAR) 10 MG tablet Take 1 tablet (10 mg total) by mouth 2 (two) times daily. 05/02/23  Yes Sarina Ill, DO  divalproex (DEPAKOTE) 500 MG DR tablet Take 1 tablet (500 mg total) by mouth 2 (two) times daily. 05/02/23  Yes Sarina Ill, DO  methocarbamol (ROBAXIN) 750 MG tablet Take 750 mg by mouth 3 (three) times daily. 07/06/23  Yes [provider]  oxyCODONE (OXY IR/ROXICODONE) 5 MG immediate release tablet Take 5 mg by mouth 4 (four) times daily as needed. 05/23/23  Yes [provider]  sertraline (ZOLOFT) 50 MG  tablet Take 3 tablets (150 mg total) by mouth daily. 05/03/23  Yes Sarina Ill, DO  traZODone (DESYREL) 100 MG tablet Take 2 tablets (200 mg total) by mouth at bedtime. 05/02/23  Yes Sarina Ill, DO  Vitamin D, Ergocalciferol, (DRISDOL) 1.25 MG (50000 UNIT) CAPS capsule Take 50,000 Units by mouth every 7 (seven) days. 07/06/23  Yes [provider]  XTAMPZA ER 9 MG C12A Take 1 capsule by mouth 2 (two) times daily. 07/06/23  Yes [provider]  amLODipine (NORVASC) 5 MG tablet Take 1 tablet (5 mg total) by mouth daily. 05/02/23 06/01/23  Sarina Ill, DO  HYDROcodone-acetaminophen (NORCO/VICODIN) 5-325 MG tablet Take 1 tablet by mouth every 6 (six) hours as needed. 05/11/23   Mannie Stabile, PA-C      Allergies    Patient has no known allergies.    Review of Systems   Review of Systems  Psychiatric/Behavioral:  Positive for behavioral problems, dysphoric mood, self-injury and suicidal ideas.   All other systems reviewed and are negative.   Physical Exam Updated Vital Signs BP (!) 141/96   Pulse 90   Temp 98 F (36.7 C) (Oral)   Resp 18   Ht 5\' 9"  (1.753 m)   Wt 94.3 kg   SpO2 92%   BMI 30.72 kg/m  Physical Exam Vitals and nursing note reviewed.  Constitutional:      General: He is not in acute distress.  Appearance: Normal appearance.  HENT:     Head: Normocephalic and atraumatic.  Eyes:     Extraocular Movements: Extraocular movements intact.     Conjunctiva/sclera: Conjunctivae normal.  Cardiovascular:     Rate and Rhythm: Normal rate.     Pulses: Normal pulses.     Heart sounds: Normal heart sounds. No murmur heard.    No friction rub. No gallop.  Pulmonary:     Effort: Pulmonary effort is normal. No respiratory distress.     Breath sounds: Normal breath sounds.  Abdominal:     General: Abdomen is flat.     Palpations: Abdomen is soft.     Tenderness: There is no abdominal tenderness.  Skin:    General: Skin  is warm and dry.     Findings: Lesion (Bites too numerous to count on upper and lower extremities that resemble bedbug bites.  No infection, erythema present at this time.) present.  Neurological:     General: No focal deficit present.     Mental Status: He is alert. Mental status is at baseline.  Psychiatric:     Comments: Patient appeared irritated and dysthymic. States he cannot go back to his group home.  He states that he is step in front of a car if he leaves here without talking to the psychiatry or behavioral health.     ED Results / Procedures / Treatments   Labs (all labs ordered are listed, but only abnormal results are displayed) Labs Reviewed  COMPREHENSIVE METABOLIC PANEL - Abnormal; Notable for the following components:      Result Value   CO2 21 (*)    Total Bilirubin 1.2 (*)    All other components within normal limits  SALICYLATE LEVEL - Abnormal; Notable for the following components:   Salicylate Lvl <7.0 (*)    All other components within normal limits  ACETAMINOPHEN LEVEL - Abnormal; Notable for the following components:   Acetaminophen (Tylenol), Serum <10 (*)    All other components within normal limits  CBC - Abnormal; Notable for the following components:   MCV 101.5 (*)    All other components within normal limits  RAPID URINE DRUG SCREEN, HOSP PERFORMED - Abnormal; Notable for the following components:   Cocaine POSITIVE (*)    All other components within normal limits  ETHANOL    EKG None  Radiology No results found.  Procedures Procedures    Medications Ordered in ED Medications  amLODipine (NORVASC) tablet 5 mg (has no administration in time range)  busPIRone (BUSPAR) tablet 10 mg (has no administration in time range)  divalproex (DEPAKOTE) DR tablet 500 mg (has no administration in time range)  sertraline (ZOLOFT) tablet 150 mg (has no administration in time range)  traZODone (DESYREL) tablet 200 mg (has no administration in time range)   acetaminophen (TYLENOL) tablet 650 mg (650 mg Oral Given 08/08/23 1942)  ibuprofen (ADVIL) tablet 400 mg (400 mg Oral Given 08/08/23 1942)    ED Course/ Medical Decision Making/ A&P                                 Medical Decision Making Amount and/or Complexity of Data Reviewed Labs: ordered.   This patient is a 67 year old male who presents to the ED for concern of suicidal ideations with plan.   Differential diagnoses prior to evaluation: The emergent differential diagnosis includes, but is not limited to, depression, anxiety, hypothyroidism,. This  is not an exhaustive differential.   Past Medical History / Co-morbidities / Social History: Drug intoxication, depression, anxiety, bipolar, adjustment disorder   Additional history: Chart reviewed. Pertinent results include: Previously admitted for bipolar 1 disorder on 04/20/2023.  Has recently lost father and sister.  Previously on mood stabilizer but did not know what it was.  Discontinued Wellbutrin then and continued with low-dose Zoloft and trazodone at that time.  Patient complained of the same complaint today with threatening to run into a car.  Lab Tests/Imaging studies: I personally interpreted labs/imaging and the pertinent results include:   CBC unremarkable Ethanol negative Salicylate negative Acetaminophen negative CMP unremarkable UDS positive for cocaine     Medications: I ordered medication including Tylenol and ibuprofen for her knee pain.  I have reviewed the patients home medicines and have made adjustments as needed.  ED Course:  Patient is a 67 year old male presents to the ED for planing of SI with plan.  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  He states that he will step out of from a car if he is admitting due to him being upset about his current life situation.   Recently seen September and admitted for bipolar citing then that he had just lost his dad and sister.  Today states that he is threatening to step in front of a car.  Says that he has had cocaine 4 days ago which he had not had for some time which she says thus demonstrates how low he is.  Also endorses alcohol use 2 days ago drinking wine.   He also states that he has many bites over his body from bedbugs.  Bites are present on physical exam on upper and lower extremities.  No sign of infection currently.  Patient's home medications were ordered.  Patient's labs came back and patient is medically cleared to be evaluated by TTS.  TTS was consulted however due to extensive psych patient load in ED, extended wait time to be seen.    Disposition: Patient is medically cleared to be evaluated by TTS.  10:02 PM Care of [name]. transferred TTS on psych hold for further evaluation at the end of my shift as the patient will require assessment from psych services before discharge or admission.  Plan at time of handoff is evaluation by TTS for further management of SI with plan. This may be altered or completely changed at the discretion of the oncoming team pending results of further workup.  Final Clinical Impression(s) / ED Diagnoses Final diagnoses:  Suicidal ideation    Rx / DC Orders ED Discharge Orders     None         Lavonia Drafts 08/08/23 2204    Derwood Kaplan, MD 08/09/23 1529

## 2023-08-09 DIAGNOSIS — F332 Major depressive disorder, recurrent severe without psychotic features: Secondary | ICD-10-CM | POA: Diagnosis not present

## 2023-08-09 DIAGNOSIS — F32A Depression, unspecified: Secondary | ICD-10-CM | POA: Diagnosis not present

## 2023-08-09 MED ORDER — ACETAMINOPHEN 325 MG PO TABS
650.0000 mg | ORAL_TABLET | Freq: Once | ORAL | Status: AC
  Administered 2023-08-09: 650 mg via ORAL
  Filled 2023-08-09: qty 2

## 2023-08-09 NOTE — ED Notes (Signed)
Pt given breakfast meal tray. 

## 2023-08-09 NOTE — Consult Note (Signed)
[facility] Consult Note  Patient Name: Todd [name]. MRN: [id number] DOB: Oct 19, 1955 DATE OF Consult: 08/09/2023  PRIMARY PSYCHIATRIC DIAGNOSES Unspecified depressive disorder; Rule out substance induced depressive disorder; Polysubstance use disorder by present history; Rule out neurocognitive disorder; Bipolar disorder type I with most recent episode depressed per present history; Rule out delirium due to general medical condition  Based on my current evaluation and assessment of the patient, he is a 67 y.o. male who presents with suicidal ideations with plan to walk into traffic in the context of noncompliance with outpatient mental health services given patient is unable to find transportation to pick up his psychiatric medications and attend outpatient mental health appointments. Moreover, his depression is further complicated by relapse on cocaine and resulting withdrawal symptoms. The patient's presentation is consistent with Unspecified depressive disorder; Rule out substance induced depressive disorder; Polysubstance use disorder by present history; Rule out neurocognitive disorder; Bipolar disorder type I with most recent episode depressed per present history; Rule out delirium due to general medical condition. Therefore, patient does meet criteria for an intensive inpatient psychiatric hospitalization.  RECOMMENDATIONS   Medication recommendations:  Risks, benefits, side effects and alternatives to treatments reviewed:   As needed medications to manage patient's acute symptoms while in hospital care:   -Maximize utilization of verbal de-escalation techniques, if attempts are unsuccessful and patient poses a threat to self and others: Consider olanzapine (Zyprexa) 2.5 mg PO/IM every 8 hours as needed for severe agitation. Would offer patient the option of taking PO medication first, but if patient refuses then may administer IM medication as a last resort. Would not exceed 10 mg  of olanzapine within a 24-hour period. Avoid co-administering intramuscular olanzapine with intravenous benzodiazepine, as giving both medications concurrently is associated with respiratory depression.   Non-Medication recommendations:  -Recommend obtaining EKG if not already obtained to guide clinical management of patient. Please stop all antipsychotic and QTc prolonging medications if patient's QTc is greater than 480 ms. Of note, to decrease the risk of prolonged QTc, please maintain potassium and magnesium levels within normal ranges. -Agree with work up for organic causes of altered mentation and mood dysregulation, consider the following if not already performed: CT of the head, CBC and differential, basic metabolic profile, liver function tests (if abnormal consider ammonia level), urinalysis, urine toxicology screen, vitamin B12 level, vitamin D level, TSH with reflex free T4  Observation recommendations:  per unit protocol for management of suicidal patinet    Is inpatient psychiatric hospitalization recommended for this patient? Yes (Explain why): patient is at high risk to self and othrs  Follow-Up Telepsychiatry C/L services: We will sign off for now. Please re-consult our service if needed for any concerning changes in the patient's condition, discharge planning, or questions.  Communication: Treatment team members (and family members if applicable) who were involved in treatment/care discussions and planning, and with whom we spoke or engaged with via secure text/chat, include the following: primary team  Thank you for involving [place] in the care of this patient. If you have any additional questions or concerns, please call [phone] and ask for me or the provider on-call.  Total time spent in this encounter was 60 minutes with greater than 50% of time spent in counseling and coordination of care.  TELEPSYCHIATRY ATTESTATION & CONSENT  As the provider for this telehealth consult, I  attest that I verified the patient's identity using two separate identifiers, introduced myself to the patient, provided my credentials, disclosed my location, and  performed this encounter via a HIPAA-compliant, real-time, face-to-face, two-way, interactive audio and video platform and with the full consent and agreement of the patient (or guardian as applicable.)  Patient physical location: [place]. Telehealth provider physical location: home office in state of [place].  Video start time: 0850 (Central Time) Video end time: 0910 [facility] Time)  IDENTIFYING DATA  Todd [name]. is a 67 y.o. year-old male for whom a psychiatric consultation has been ordered by the primary provider. The patient was identified using two separate identifiers.  CHIEF COMPLAINT/REASON FOR CONSULT  Suicidal ideations with plan  HISTORY OF PRESENT ILLNESS (HPI)  I evaluated the patient today face-to-face via secure, HIPAA-compliant telepsychiatric connection, and at the request of the primary treatment team. The reason for the telepsychiatric consultation is that the patient is a 67 year old male with a history of diabetes mellitus type II, essential hypertension, atrial flutter with RVR, pulmonary nodule, major depressive disorder, and polysubstance use disorder who presents for psychiatric evaluation given suicidal ideations with plan "to jump in front of a car..". Of note, urine toxicology screen with presumptive positive for cocaine. Primary team is seeking psychotropic medication recommendations, safety evaluation to determine appropriateness for more intensive psychiatric services and diagnostic clarity as to the patient's presentation.   During one-on-one evaluation with this provider, patient was alert and oriented to self and generally to location and situation. The patient did not appear to be inappropriately internally preoccupied; patient's thought process was linear and concrete. Patient admits that he recently  relapsed on cocaine with the past several days. He reports that in addition to this, his mood has been deteriorating given that he has not been able to have access to any of his prescribed psychotropic medications given barriers to transportation. He reported that his current living situation is not ideal as well, given that they keep the home cold and he has very little to no assistance with his medical and mental health needs. Patient reported that all these stressors together, in addition to his progressive depression and anxiety, are causing him to seriously consider suicide by purposefully going into traffic. Patient was unable to contract for safety; however, he is amenable to engage in intensive inpatient psychiatric treatment.   PAST PSYCHIATRIC HISTORY  Inpatient psychiatric treatment: per chart documentation, yes; patient with previous inpatient admission in 04/2023 in the context of decompensated depression wherein he as having similar suicide plan to go "running into a car." Outpatient mental health treatment: per chart documentation, patient was recommended to [facility] for [facility] health services following discharge in 04/2023; however, transportation barriers have prevented this Current home psychotropic medications: per patient, denies  Previous mental health diagnoses: per chart documentation, bipolar disorder type I with most recent episode depressed  Suicide attempts: per patient - he asserts that he is unsure how to answer this prompt  Trauma history: patient did not assert current concerns for abuse/trauma/ exploitation Otherwise as per HPI above.  PAST MEDICAL HISTORY  Past Medical History:  Diagnosis Date   Arthritis    "fingers" (07/14/2018)   Depression    DVT (deep venous thrombosis) (HCC) LLE   Hepatitis C     finished [facility] tx ~ 2017   Hypercholesterolemia    Hypertension    Prostate cancer (HCC) 71yrs ago   Pulmonary embolism and infarction  (HCC) 07/13/2018   Sleep apnea    not currently using cpap, mask causing vertigo   Type II diabetes mellitus (HCC)  HOME MEDICATIONS  Facility Ordered Medications  Medication   [COMPLETED] acetaminophen (TYLENOL) tablet 650 mg   [COMPLETED] ibuprofen (ADVIL) tablet 400 mg   amLODipine (NORVASC) tablet 5 mg   busPIRone (BUSPAR) tablet 10 mg   divalproex (DEPAKOTE) DR tablet 500 mg   sertraline (ZOLOFT) tablet 150 mg   traZODone (DESYREL) tablet 200 mg   PTA Medications  Medication Sig   busPIRone (BUSPAR) 10 MG tablet Take 1 tablet (10 mg total) by mouth 2 (two) times daily.   sertraline (ZOLOFT) 50 MG tablet Take 3 tablets (150 mg total) by mouth daily. (Patient taking differently: Take 100 mg by mouth daily.)   traZODone (DESYREL) 100 MG tablet Take 2 tablets (200 mg total) by mouth at bedtime. (Patient taking differently: Take 100 mg by mouth at bedtime.)   HYDROcodone-acetaminophen (NORCO/VICODIN) 5-325 MG tablet Take 1 tablet by mouth every 6 (six) hours as needed.   methocarbamol (ROBAXIN) 750 MG tablet Take 750 mg by mouth 3 (three) times daily.   oxyCODONE (OXY IR/ROXICODONE) 5 MG immediate release tablet Take 5 mg by mouth 4 (four) times daily as needed.   XTAMPZA ER 9 MG C12A Take 1 capsule by mouth 2 (two) times daily.   divalproex (DEPAKOTE) 500 MG DR tablet Take 1 tablet (500 mg total) by mouth 2 (two) times daily. (Patient not taking: Reported on 08/08/2023)   allopurinol (ZYLOPRIM) 100 MG tablet Take 200 mg by mouth daily. (Patient not taking: Reported on 08/08/2023)   Vitamin D, Ergocalciferol, (DRISDOL) 1.25 MG (50000 UNIT) CAPS capsule Take 50,000 Units by mouth every 7 (seven) days. (Patient not taking: Reported on 08/08/2023)    ALLERGIES  No Known Allergies  SOCIAL & SUBSTANCE USE HISTORY  Social History   Socioeconomic History   Marital status: Single    Spouse name: Not on file   Number of children: 2   Years of education: 12th   Highest education  level: Not on file  Occupational History   Occupation: post office  Tobacco Use   Smoking status: Every Day    Current packs/day: 0.12    Average packs/day: 0.1 packs/day for 45.0 years (5.4 ttl pk-yrs)    Types: Cigarettes   Smokeless tobacco: Never  Vaping Use   Vaping status: Never Used  Substance and Sexual Activity   Alcohol use: Yes   Drug use: Yes   Sexual activity: Not Currently  Other Topics Concern   Not on file  Social History Narrative   ** Merged History Encounter **       Patient lives at home alone.Marland KitchenMarland KitchenDrinks coffee daily    Social Drivers of Corporate investment banker Strain: Low Risk  (10/27/2022)   Received from [facility]   Overall Financial Resource Strain (CARDIA)    Difficulty of Paying Living Expenses: Not very hard  Food Insecurity: No Food Insecurity (04/20/2023)   Hunger Vital Sign    Worried About Running Out of Food in the Last Year: Never true    Ran Out of Food in the Last Year: Never true  Transportation Needs: No Transportation Needs (04/20/2023)   PRAPARE - Administrator, Civil Service (Medical): No    Lack of Transportation (Non-Medical): No  Physical Activity: Inactive (10/27/2022)   Received from [facility]   Exercise Vital Sign    Days of Exercise per Week: 0 days    Minutes of Exercise per Session: 0 min  Stress: Stress Concern Present (10/27/2022)   Received  from [facility] Occupational [facility] - Occupational Stress Questionnaire    Feeling of Stress : To some extent  Social Connections: Socially Isolated (10/27/2022)   [facility]   Social Connection and Isolation Panel [NHANES]    Frequency of Communication with Friends and Family: More than three times a week    Frequency of Social Gatherings with Friends and Family: More than three times a week    Attends Religious Services: Never    Database administrator or Organizations: No    Attends Hospital doctor: Never    Marital Status: Divorced   Social History   Tobacco Use  Smoking Status Every Day   Current packs/day: 0.12   Average packs/day: 0.1 packs/day for 45.0 years (5.4 ttl pk-yrs)   Types: Cigarettes  Smokeless Tobacco Never   Social History   Substance and Sexual Activity  Alcohol Use Yes   Social History   Substance and Sexual Activity  Drug Use Yes    Additional pertinent information as per HPI.  FAMILY HISTORY  Family History  Problem Relation Age of Onset   Cancer Father        PROSTATE   Family Psychiatric History (if known):  none disclosed   MENTAL STATUS EXAM (MSE)  Mental Status Exam: General Appearance: Disheveled  Orientation:  Full (Time, Place, and Person)  Memory:  Immediate;   Fair Recent;   Fair Remote;   Fair  Concentration:  Concentration: Fair and Attention Span: Fair  Recall:  Fair  Attention  Fair  Eye Contact:  Fair  Speech:   fair articulation  Language:  Fair  Volume:  Normal  Mood: depressed  Affect:  Congruent  Thought Process:  Coherent  Thought Content:  Logical  Suicidal Thoughts:  Yes.  with intent/plan  Homicidal Thoughts:  No  Judgement:  Fair  Insight:  Fair  Psychomotor Activity:  Decreased  Akathisia:  No  Fund of Knowledge:  Fair    Assets:  Desire for Improvement  Cognition:  WNL  ADL's:  Impaired  AIMS (if indicated):       VITALS  Blood pressure 115/74, pulse 66, temperature 98.1 F (36.7 C), temperature source Oral, resp. rate 18, height 5\' 9"  (1.753 m), weight 94.3 kg, SpO2 94%.  LABS  Admission on 08/08/2023  Component Date Value Ref Range Status   Sodium 08/08/2023 139  135 - 145 mmol/L Final   Potassium 08/08/2023 3.7  3.5 - 5.1 mmol/L Final   Chloride 08/08/2023 107  98 - 111 mmol/L Final   CO2 08/08/2023 21 (L)  22 - 32 mmol/L Final   Glucose, Bld 08/08/2023 81  70 - 99 mg/dL Final   Glucose reference range applies only to samples taken after fasting for at least 8 hours.   BUN  08/08/2023 15  8 - 23 mg/dL Final   Creatinine, Ser 08/08/2023 0.97  0.61 - 1.24 mg/dL Final   Calcium 16/05/9603 9.3  8.9 - 10.3 mg/dL Final   Total Protein 54/04/8118 7.7  6.5 - 8.1 g/dL Final   Albumin 14/78/2956 3.8  3.5 - 5.0 g/dL Final   AST 21/30/8657 21  15 - 41 U/L Final   ALT 08/08/2023 13  0 - 44 U/L Final   Alkaline Phosphatase 08/08/2023 60  38 - 126 U/L Final   Total Bilirubin 08/08/2023 1.2 (H)  <1.2 mg/dL Final   GFR, Estimated 08/08/2023 >60  >60 mL/min Final  Comment: (NOTE) Calculated using the CKD-EPI Creatinine Equation (2021)    Anion gap 08/08/2023 11  5 - 15 Final   Performed at [facility], [address], [place], [place] [address]   Alcohol, Ethyl (B) 08/08/2023 <10  <10 mg/dL Final   Comment: (NOTE) Lowest detectable limit for serum alcohol is 10 mg/dL.  For medical purposes only. Performed at [facility], [address] [place], [place] [address]    Salicylate Lvl 08/08/2023 <7.0 (L)  7.0 - 30.0 mg/dL Final   Performed at [facility], [address], [place], [place] [address]   Acetaminophen (Tylenol), Serum 08/08/2023 <10 (L)  10 - 30 ug/mL Final   Comment: (NOTE) Therapeutic concentrations vary significantly. A range of 10-30 ug/mL  may be an effective concentration for many patients. However, some  are best treated at concentrations outside of this range. Acetaminophen concentrations >150 ug/mL at 4 hours after ingestion  and >50 ug/mL at 12 hours after ingestion are often associated with  toxic reactions.  Performed at [facility], [address] [place], [place] [address]    WBC 08/08/2023 4.5  4.0 - 10.5 K/uL Final   RBC 08/08/2023 4.78  4.22 - 5.81 MIL/uL Final   Hemoglobin 08/08/2023 16.0  13.0 - 17.0 g/dL Final   HCT 57/32/2025 48.5  39.0 - 52.0 % Final   MCV 08/08/2023 101.5 (H)  80.0 - 100.0 fL Final   MCH 08/08/2023 33.5  26.0 - 34.0 pg Final   MCHC  08/08/2023 33.0  30.0 - 36.0 g/dL Final   RDW 42/70/6237 15.0  11.5 - 15.5 % Final   Platelets 08/08/2023 170  150 - 400 K/uL Final   nRBC 08/08/2023 0.0  0.0 - 0.2 % Final   Performed at [facility], [address] [place], [place] [address]   Opiates 08/08/2023 NONE DETECTED  NONE DETECTED Final   Cocaine 08/08/2023 POSITIVE (A)  NONE DETECTED Final   Benzodiazepines 08/08/2023 NONE DETECTED  NONE DETECTED Final   Amphetamines 08/08/2023 NONE DETECTED  NONE DETECTED Final   Tetrahydrocannabinol 08/08/2023 NONE DETECTED  NONE DETECTED Final   Barbiturates 08/08/2023 NONE DETECTED  NONE DETECTED Final   Comment: (NOTE) DRUG SCREEN FOR MEDICAL PURPOSES ONLY.  IF CONFIRMATION IS NEEDED FOR ANY PURPOSE, NOTIFY LAB WITHIN 5 DAYS.  LOWEST DETECTABLE LIMITS FOR URINE DRUG SCREEN Drug Class                     Cutoff (ng/mL) Amphetamine and metabolites    1000 Barbiturate and metabolites    200 Benzodiazepine                 200 Opiates and metabolites        300 Cocaine and metabolites        300 THC                            50 Performed at [facility], [address], [place], [place] [address]     PSYCHIATRIC REVIEW OF SYSTEMS (ROS)  ROS: Notable for the following relevant positive findings: Review of Systems  Psychiatric/Behavioral:  Positive for depression, substance abuse and suicidal ideas. Negative for hallucinations and memory loss. The patient is nervous/anxious and has insomnia.     Additional findings:      Musculoskeletal: No abnormal movements observed      Gait &  Station: Laying/Sitting      Pain Screening: Present - mild to moderate      Nutrition & Dental Concerns: Decrease in food intake and/or loss of appetite  RISK FORMULATION/ASSESSMENT  Is the patient experiencing any suicidal or homicidal ideations: Yes       Explain if yes: with suicidal ideations with plan to walk into traffic  Protective factors considered for  safety management: current care in a highly monitored health care setting  Risk factors/concerns considered for safety management:  Depression Substance abuse/dependence Physical illness/chronic pain Age over 61 Hopelessness Barriers to accessing treatment Male gender Unmarried  Is there a safety management plan with the patient and treatment team to minimize risk factors and promote protective factors: Yes           Explain: psychiatric hospitalization Is crisis care placement or psychiatric hospitalization recommended: Yes     Based on my current evaluation and risk assessment, patient is determined at this time to be at:  High risk  *RISK ASSESSMENT Risk assessment is a dynamic process; it is possible that this patient's condition, and risk level, may change. This should be re-evaluated and managed over time as appropriate. Please re-consult psychiatric consult services if additional assistance is needed in terms of risk assessment and management. If your team decides to discharge this patient, please advise the patient how to best access emergency psychiatric services, or to call 911, if their condition worsens or they feel unsafe in any way.   Rodena Medin, MD Telepsychiatry Consult Services

## 2023-08-10 DIAGNOSIS — F32A Depression, unspecified: Secondary | ICD-10-CM | POA: Diagnosis not present

## 2023-08-10 MED ORDER — ACETAMINOPHEN 500 MG PO TABS
1000.0000 mg | ORAL_TABLET | Freq: Three times a day (TID) | ORAL | Status: DC | PRN
  Administered 2023-08-10 (×2): 1000 mg via ORAL
  Filled 2023-08-10 (×2): qty 2

## 2023-08-10 NOTE — Progress Notes (Signed)
Patient has been denied by [facility] due to no appropriate beds available. Patient meets BH inpatient criteria per Phebe Colla, NP. Patient has been faxed out to the following facilities:   [facility] [address] [place] [address] [phone]  [facility] Patient Placement [facility], [place] [phone]  [facility] [address] [place] [address] [phone]  [facility] [address], [place] [address] [phone]  [facility] [address] [place], [place] [address] [phone]  [facility] [address] [place] [address] [phone]  [facility] [address] [place] [address] [phone]  [facility] [address], [place] [address] [phone]  [facility] [address] [place] [address] [phone]  [facility] [address], [place] [address] [phone]  [facility] BED Management Behavioral [facility] [place] [phone]  [facility] [address], [place] [address] [phone]  [facility] [address], [place] [address] [phone]  [facility] [address] [place] [address] [phone]  [facility] EFAX [address] [place], [place] [phone]  [facility] [address], [place] [address] [phone]  [facility] [address] [place] [address] [phone]  [facility] [address], [place] [address] [phone]  [facility] Inpatient [facility] [place] [phone]   Damita Dunnings, MSW, LCSW-A  7:38 PM 08/10/2023

## 2023-08-10 NOTE — Progress Notes (Signed)
LCSW Progress Note  [id number]   Todd [name].  08/10/2023  2:56 PM  Description:   Inpatient Psychiatric Referral  Patient was recommended inpatient per Phebe Colla NP. There are [facility], per [facility] RN Patient was referred to the following out of network facilities:    [facility] Provider Address Phone Fax  [facility] [address] [place] [address] [phone]  [facility], [place] [phone]  [facility] [address] [place] [address] [phone]  [facility] [address], [place] [address] [phone]  [facility] [address] [place], [place] [address] [phone]  [facility] [address] [place] [address] [phone]  [facility] [address] [place] [address] [phone]  [facility] [address] [place] [address] [phone]  [facility] [address] [place] [address] [phone]  [facility] [address] [place] [address] [phone]  [facility] BED Management [facility] [place] [phone]  [facility] [address] [place] [address] [phone]  [facility] [address], [place] [address] [phone]  [facility] [address], [place] [address] [phone]  [facility] EFAX [address] [place], [place] [phone]  [facility] [address] [place] [address]  [phone]  [facility] [address] [place] [address] [phone]  [facility] [address], [place] [address] [phone]  [facility] Inpatient EFAX [place] [phone]      Situation ongoing, CSW to continue following and update chart as more information becomes available.      [place] Kaylianna Detert, MSW, LCSW  08/10/2023 2:56 PM

## 2023-08-10 NOTE — ED Notes (Signed)
Patient has been alert this shift. Patient medication compliant. Patient appropriate with staff and cooperative. Patient endorsed still feeling low energy, anxiety, unable to complete thoughts and depressed.

## 2023-08-10 NOTE — Progress Notes (Signed)
[facility] LCSW Progress Note   08/10/2023    8:38 PM  Todd [name]   [id number]   Type of Contact and Topic:  Psychiatric Bed Placement   Pt accepted to [facility]     Patient meets inpatient criteria per Phebe Colla, NP   The attending provider will be Dr. Loni Beckwith   Call report to [phone]  Ronna Polio, RN @ [facility] ED notified.     Pt scheduled  to arrive at [facility] AFTER 0900.    Damita Dunnings, MSW, LCSW-A  8:39 PM 08/10/2023

## 2023-08-10 NOTE — ED Notes (Signed)
Pt offered and refused copy of unit rules by Mr Denigris and appear agitated when staff attempt to review them.

## 2023-08-10 NOTE — ED Provider Notes (Signed)
Emergency Medicine Observation Re-evaluation Note  Todd [name]. is a 67 y.o. male, seen on rounds today.  Pt initially presented to the ED for complaints of Suicidal Currently, the patient is resting.  No acute distress.  Physical Exam  BP (!) 131/90 (BP Location: Left Arm)   Pulse 69   Temp 99 F (37.2 C) (Oral)   Resp 15   Ht 1.753 m (5\' 9" )   Wt 94.3 kg   SpO2 94%   BMI 30.72 kg/m  Physical Exam   ED Course / MDM  EKG:   I have reviewed the labs performed to date as well as medications administered while in observation.  Recent changes in the last 24 hours include none.  Plan  Current plan is for placement.    Lorre Nick, MD 08/10/23 [phone]

## 2023-08-10 NOTE — ED Notes (Addendum)
Call from [place] at [facility] who is currently reviewing pt for possible admisson.

## 2023-08-11 DIAGNOSIS — F32A Depression, unspecified: Secondary | ICD-10-CM | POA: Diagnosis not present

## 2023-08-11 NOTE — ED Notes (Signed)
[facility] called to take

## 2023-08-11 NOTE — ED Notes (Signed)
Pt accepted at [facility] waiting call to give report.

## 2023-08-11 NOTE — ED Notes (Signed)
Mr Todd [name] informed of [facility] placement and agrees [place].

## 2023-08-11 NOTE — ED Provider Notes (Addendum)
Emergency Medicine Observation Re-evaluation Note  Todd [name]. is a 67 y.o. male, seen on rounds today.  Pt initially presented to the ED for complaints of Suicidal Currently, the patient is resting.  Physical Exam  BP (!) 135/94 (BP Location: Left Arm)   Pulse 76   Temp 98.3 F (36.8 C) (Oral)   Resp 14   Ht 5\' 9"  (1.753 m)   Wt 94.3 kg   SpO2 94%   BMI 30.72 kg/m  Physical Exam General: resting comfortably, NAD Lungs: normal WOB Psych: currently calm and resting  ED Course / MDM  EKG:   I have reviewed the labs performed to date as well as medications administered while in observation.  Recent changes in the last 24 hours include none.  Plan  Current plan is for placement to [facility].  1106: patient accepted to [facility], emtala filled out and patient stable at time of transfer.    Rozelle Logan, DO 08/11/23 0730    Rozelle Logan, DO 08/11/23 1106

## 2023-08-15 ENCOUNTER — Ambulatory Visit: Payer: Medicare HMO | Admitting: Internal Medicine

## 2023-10-08 ENCOUNTER — Encounter (HOSPITAL_COMMUNITY): Payer: Self-pay | Admitting: Emergency Medicine

## 2023-10-08 ENCOUNTER — Emergency Department (HOSPITAL_COMMUNITY)
Admission: EM | Admit: 2023-10-08 | Discharge: 2023-10-08 | Disposition: A | Discharge status: Home / Self Care | Payer: Medicare HMO | Attending: Emergency Medicine | Admitting: Emergency Medicine

## 2023-10-08 ENCOUNTER — Other Ambulatory Visit: Payer: Self-pay

## 2023-10-08 ENCOUNTER — Emergency Department (HOSPITAL_COMMUNITY): Payer: Medicare HMO

## 2023-10-08 DIAGNOSIS — E119 Type 2 diabetes mellitus without complications: Secondary | ICD-10-CM | POA: Diagnosis not present

## 2023-10-08 DIAGNOSIS — J439 Emphysema, unspecified: Secondary | ICD-10-CM | POA: Diagnosis not present

## 2023-10-08 DIAGNOSIS — M25561 Pain in right knee: Secondary | ICD-10-CM | POA: Diagnosis present

## 2023-10-08 DIAGNOSIS — M19011 Primary osteoarthritis, right shoulder: Secondary | ICD-10-CM | POA: Insufficient documentation

## 2023-10-08 DIAGNOSIS — M19012 Primary osteoarthritis, left shoulder: Secondary | ICD-10-CM | POA: Diagnosis not present

## 2023-10-08 DIAGNOSIS — M778 Other enthesopathies, not elsewhere classified: Secondary | ICD-10-CM | POA: Insufficient documentation

## 2023-10-08 DIAGNOSIS — Z86718 Personal history of other venous thrombosis and embolism: Secondary | ICD-10-CM | POA: Diagnosis not present

## 2023-10-08 DIAGNOSIS — Z86711 Personal history of pulmonary embolism: Secondary | ICD-10-CM | POA: Insufficient documentation

## 2023-10-08 DIAGNOSIS — Z79899 Other long term (current) drug therapy: Secondary | ICD-10-CM | POA: Insufficient documentation

## 2023-10-08 DIAGNOSIS — M170 Bilateral primary osteoarthritis of knee: Secondary | ICD-10-CM | POA: Insufficient documentation

## 2023-10-08 DIAGNOSIS — R059 Cough, unspecified: Secondary | ICD-10-CM | POA: Diagnosis not present

## 2023-10-08 DIAGNOSIS — M85811 Other specified disorders of bone density and structure, right shoulder: Secondary | ICD-10-CM | POA: Diagnosis not present

## 2023-10-08 DIAGNOSIS — M1990 Unspecified osteoarthritis, unspecified site: Secondary | ICD-10-CM

## 2023-10-08 DIAGNOSIS — I10 Essential (primary) hypertension: Secondary | ICD-10-CM | POA: Insufficient documentation

## 2023-10-08 DIAGNOSIS — M7580 Other shoulder lesions, unspecified shoulder: Secondary | ICD-10-CM

## 2023-10-08 LAB — RESP PANEL BY RT-PCR (RSV, FLU A&B, COVID)  RVPGX2
Influenza A by PCR: NEGATIVE
Influenza B by PCR: NEGATIVE
Resp Syncytial Virus by PCR: NEGATIVE
SARS Coronavirus 2 by RT PCR: NEGATIVE

## 2023-10-08 MED ORDER — DOXYCYCLINE HYCLATE 100 MG PO TABS: 100.0000 mg | ORAL_TABLET | Freq: Two times a day (BID) | ORAL | 0 refills | Status: DC

## 2023-10-08 MED ORDER — OXYCODONE HCL 5 MG PO CAPS: 10.0000 mg | ORAL_CAPSULE | Freq: Four times a day (QID) | ORAL | 0 refills | Status: DC | PRN

## 2023-10-08 MED ORDER — PREDNISONE 50 MG PO TABS: 50.0000 mg | ORAL_TABLET | Freq: Every day | ORAL | 0 refills | Status: DC

## 2023-10-08 MED ORDER — HYDROMORPHONE HCL 1 MG/ML IJ SOLN
1.0000 mg | Freq: Once | INTRAMUSCULAR | Status: AC
  Administered 2023-10-08: 1 mg via INTRAMUSCULAR
  Filled 2023-10-08: qty 1

## 2023-10-08 MED ORDER — DOXYCYCLINE HYCLATE 100 MG PO TABS
100.0000 mg | ORAL_TABLET | Freq: Once | ORAL | Status: AC
  Administered 2023-10-08: 100 mg via ORAL
  Filled 2023-10-08: qty 1

## 2023-10-08 MED ORDER — OXYCODONE HCL 5 MG PO TABS
5.0000 mg | ORAL_TABLET | Freq: Once | ORAL | Status: AC
  Administered 2023-10-08: 5 mg via ORAL
  Filled 2023-10-08: qty 1

## 2023-10-08 MED ORDER — PREDNISONE 50 MG PO TABS
50.0000 mg | ORAL_TABLET | Freq: Once | ORAL | Status: AC
  Administered 2023-10-08: 50 mg via ORAL
  Filled 2023-10-08: qty 1

## 2023-10-08 NOTE — Discharge Instructions (Addendum)
 Take the medications to help with your pain and discomfort.  Follow-up with an orthopedic doctor for further evaluation.  We did place a consult to our social workers who were not available at night.  They should reach out to you to try to assist you with your housing concerns

## 2023-10-08 NOTE — ED Provider Notes (Addendum)
 [facility] AT [facility] Provider Note   CSN: [id number] Arrival date & time: 10/08/23  1757     History  Chief Complaint  Patient presents with   Knee Pain   Cough    Todd [name]. is a 68 y.o. male.   Knee Pain Cough    Patient has a history of hypertension DVT hypercholesterolemia hepatitis diabetes PE depression.  He states he has been living in a boardinghouse.  Patient does not think it is the best living situation.  Patient states he has been having trouble with pain in his knees as well as his shoulders.  He also feels some in his ankles.  He has been having a cough.  No fevers.  No recent falls.  Patient is also hoping he can speak with the social worker  Home Medications Prior to Admission medications   Medication Sig Start Date End Date Taking? Authorizing Provider  doxycycline (VIBRA-TABS) 100 MG tablet Take 1 tablet (100 mg total) by mouth 2 (two) times daily. 10/08/23  Yes Linwood Dibbles, MD  oxycodone (OXY-IR) 5 MG capsule Take 2 capsules (10 mg total) by mouth every 6 (six) hours as needed. 10/08/23  Yes Linwood Dibbles, MD  predniSONE (DELTASONE) 50 MG tablet Take 1 tablet (50 mg total) by mouth daily. 10/08/23  Yes Linwood Dibbles, MD  allopurinol (ZYLOPRIM) 100 MG tablet Take 200 mg by mouth daily. Patient not taking: Reported on 08/08/2023 07/06/23   [provider]  amLODipine (NORVASC) 5 MG tablet Take 1 tablet (5 mg total) by mouth daily. Patient not taking: Reported on 08/08/2023 05/02/23 06/01/23  Sarina Ill, DO  busPIRone (BUSPAR) 10 MG tablet Take 1 tablet (10 mg total) by mouth 2 (two) times daily. 05/02/23   Sarina Ill, DO  divalproex (DEPAKOTE) 500 MG DR tablet Take 1 tablet (500 mg total) by mouth 2 (two) times daily. Patient not taking: Reported on 08/08/2023 05/02/23   Sarina Ill, DO  methocarbamol (ROBAXIN) 750 MG tablet Take 750 mg by mouth 3 (three) times daily. 07/06/23    [provider]  sertraline (ZOLOFT) 50 MG tablet Take 3 tablets (150 mg total) by mouth daily. Patient taking differently: Take 100 mg by mouth daily. 05/03/23   Sarina Ill, DO  traZODone (DESYREL) 100 MG tablet Take 2 tablets (200 mg total) by mouth at bedtime. Patient taking differently: Take 100 mg by mouth at bedtime. 05/02/23   Sarina Ill, DO  Vitamin D, Ergocalciferol, (DRISDOL) 1.25 MG (50000 UNIT) CAPS capsule Take 50,000 Units by mouth every 7 (seven) days. Patient not taking: Reported on 08/08/2023 07/06/23   [provider]  XTAMPZA ER 9 MG C12A Take 1 capsule by mouth 2 (two) times daily. 07/06/23   [provider]      Allergies    Patient has no known allergies.    Review of Systems   Review of Systems  Respiratory:  Positive for cough.     Physical Exam Updated Vital Signs BP (!) 126/93   Pulse 87   Temp 98.2 F (36.8 C) (Oral)   Resp 18   SpO2 97%  Physical Exam Vitals and nursing note reviewed.  Constitutional:      Appearance: He is well-developed. He is not diaphoretic.  HENT:     Head: Normocephalic and atraumatic.     Right Ear: External ear normal.     Left Ear: External ear normal.  Eyes:  General: No scleral icterus.       Right eye: No discharge.        Left eye: No discharge.     Conjunctiva/sclera: Conjunctivae normal.  Neck:     Trachea: No tracheal deviation.  Cardiovascular:     Rate and Rhythm: Normal rate and regular rhythm.  Pulmonary:     Effort: Pulmonary effort is normal. No respiratory distress.     Breath sounds: Normal breath sounds. No stridor. No wheezing or rales.  Abdominal:     General: Bowel sounds are normal. There is no distension.     Palpations: Abdomen is soft.     Tenderness: There is no abdominal tenderness. There is no guarding or rebound.  Musculoskeletal:        General: Tenderness present. No deformity.     Cervical back: Neck supple.     Comments:  Tenderness palpation bilateral shoulders, pain with range of motion, no effusions noted, tender to palpation bilateral knees and ankles, extremities are warm and well-perfused, normal pulses, no erythema or edema  Skin:    General: Skin is warm and dry.     Findings: No rash.  Neurological:     General: No focal deficit present.     Mental Status: He is alert.     Cranial Nerves: No cranial nerve deficit, dysarthria or facial asymmetry.     Sensory: No sensory deficit.     Motor: No abnormal muscle tone or seizure activity.     Coordination: Coordination normal.  Psychiatric:        Mood and Affect: Mood normal.     ED Results / Procedures / Treatments   Labs (all labs ordered are listed, but only abnormal results are displayed) Labs Reviewed  RESP PANEL BY RT-PCR (RSV, FLU A&B, COVID)  [id number]    EKG None  Radiology DG Shoulder Right Result Date: 10/08/2023 CLINICAL DATA:  Cough and joint aches. EXAM: RIGHT SHOULDER - 2+ VIEW COMPARISON:  Right shoulder x-ray 02/15/2022 FINDINGS: There is no acute fracture or dislocation. There severe subacromial joint space narrowing. Bones are osteopenic. Soft tissues are within normal limits. IMPRESSION: 1. No acute fracture or dislocation. 2. Severe subacromial joint space narrowing can be seen with chronic rotator cuff tendinopathy. Electronically Signed   By: Darliss Cheney M.D.   On: 10/08/2023 20:05   DG Knee 2 Views Right Result Date: 10/08/2023 CLINICAL DATA:  Right knee pain EXAM: RIGHT KNEE - 1-2 VIEW COMPARISON:  05/11/2023 FINDINGS: Frontal and lateral views of the right knee demonstrate moderate 3 compartmental osteoarthritis, with chondrocalcinosis again noted in the lateral compartment. Patella baja again noted, with continued calcifications in the distal quadriceps tendon region. No acute fracture. Small joint effusion. Soft tissues are unremarkable. IMPRESSION: 1. Stable 3 compartmental osteoarthritis and small reactive joint  effusion. 2. Stable patella baja, with calcifications within the distal quadriceps tendon unchanged. Electronically Signed   By: Sharlet Salina M.D.   On: 10/08/2023 20:02   DG Knee 2 Views Left Result Date: 10/08/2023 CLINICAL DATA:  Pain EXAM: LEFT KNEE - 1-2 VIEW COMPARISON:  02/15/2022 FINDINGS: Frontal and lateral views of the left knee are obtained. No fracture, subluxation, or dislocation. There is severe 3 compartmental osteoarthritis, most pronounced in the lateral compartment. Prominent enthesopathic changes along the upper pole of the patella. Small joint effusion. Soft tissues are unremarkable. IMPRESSION: 1. Severe 3 compartmental osteoarthritis and small reactive joint effusion. 2. No acute fracture. Electronically Signed   By: Casimiro Needle  Manson Passey M.D.   On: 10/08/2023 20:01   DG Shoulder Left Result Date: 10/08/2023 CLINICAL DATA:  Shoulder pain EXAM: LEFT SHOULDER - 2+ VIEW COMPARISON:  02/15/2022 FINDINGS: Frontal and transscapular views of the left shoulder are obtained. No acute displaced fracture. Prominent spurring along the undersurface of the acromion process. Moderate acromioclavicular joint osteoarthritis. Glenohumeral joint space is well preserved. Soft tissues are unremarkable. Left chest is clear. IMPRESSION: 1. Stable acromioclavicular joint osteoarthritis and spurring of the acromion process. 2. No acute displaced fracture. Electronically Signed   By: Sharlet Salina M.D.   On: 10/08/2023 20:00   DG Chest 2 View Result Date: 10/08/2023 CLINICAL DATA:  Cough EXAM: CHEST - 2 VIEW COMPARISON:  08/30/2021 FINDINGS: Frontal and lateral views of the chest demonstrate a stable cardiac silhouette. Background emphysema and parenchymal lung scarring again noted. There is increased density in the left suprahilar region which may reflect superimposed airspace disease. No effusion or pneumothorax. No acute bony abnormalities. IMPRESSION: 1. Increased density in the left suprahilar region,  consistent with airspace disease superimposed upon chronic scarring and emphysema. Electronically Signed   By: Sharlet Salina M.D.   On: 10/08/2023 19:56    Procedures Procedures    Medications Ordered in ED Medications  HYDROmorphone (DILAUDID) injection 1 mg (has no administration in time range)  doxycycline (VIBRA-TABS) tablet 100 mg (has no administration in time range)  predniSONE (DELTASONE) tablet 50 mg (has no administration in time range)  oxyCODONE (Oxy IR/ROXICODONE) immediate release tablet 5 mg (5 mg Oral Given 10/08/23 1828)    ED Course/ Medical Decision Making/ A&P Clinical Course as of 10/08/23 2044  Sat Oct 08, 2023  2030 Resp panel by RT-PCR (RSV, Flu A&B, Covid) Anterior Nasal Swab COVID flu RSV negative [JK]  2030 Shoulder x-ray shows findings suggestive of chronic rotator cuff tendinopathy on the right [JK]  2030 Arthritis noted in the right knee as well as left knee [JK]  2031 Arthritis noted in the left shoulder [JK]  2031 Chest x-ray with possible early pneumonia. [JK]    Clinical Course User Index [JK] Linwood Dibbles, MD                                 Medical Decision Making Problems Addressed: Arthritis: chronic illness or injury with exacerbation, progression, or side effects of treatment that poses a threat to life or bodily functions Rotator cuff tendinitis, unspecified laterality: chronic illness or injury with exacerbation, progression, or side effects of treatment  Amount and/or Complexity of Data Reviewed Labs:  Decision-making details documented in ED Course. Radiology: ordered and independent interpretation performed.  Risk Prescription drug management.   Patient presented to the ED for evaluation of pain in his joints.  Patient also concerned about his housing situation.  He lives in a halfway house and does not want to continue to live there.  Does not have any focal swelling noted.  Patient does have evidence of tendinitis in his  shoulders.  He also has evidence of arthritis in his knees.  This certainly could be causing his joint aches.  I do not see any findings to suggest acute infection.  Patient mention some cough.  He is afebrile he does not have an oxygen requirement.  His lungs are clear.  X-ray shows possible infiltrate we will start him on a course of antibiotics.  Patient frustrated about his pain.  I explained to him that we are  giving him medications for pain and I will give him prescriptions.  Patient states he cannot return to his halfway house in this condition.  Explained to the patient that we will be giving him medications for that.  He asked to see the social worker unfortunately not available at this time night.  There is no indication for hospitalization at this time.  Patient is stable for discharge.  Explained to him that I will give him prescriptions to help manage his pain as an outpatient recommend outpatient follow-up with orthopedics   Prior records have been reviewed.  Patient has been seen for similar complaints in the past.  He was seen in [place] last month for similar concerns.     Final Clinical Impression(s) / ED Diagnoses Final diagnoses:  Arthritis  Rotator cuff tendinitis, unspecified laterality    Rx / DC Orders ED Discharge Orders          Ordered    oxycodone (OXY-IR) 5 MG capsule  Every 6 hours PRN        10/08/23 2041    predniSONE (DELTASONE) 50 MG tablet  Daily        10/08/23 2041    doxycycline (VIBRA-TABS) 100 MG tablet  2 times daily        10/08/23 2041              Linwood Dibbles, MD 10/08/23 2044    Linwood Dibbles, MD 10/08/23 2045

## 2023-10-08 NOTE — ED Notes (Signed)
 Pt elected to call a cab home

## 2023-10-08 NOTE — ED Triage Notes (Signed)
 BIB [facility] from boarding house.  Bed bugs present.  None noted on patient at this time.  Pt called out EMS for bilateral knee pain, right knee has surgical hx.  Left knee has swelling.  Uses rollator normally but states he cannot walk at all at this time.  Bilateral shoulder pain as well. Cough present as well.

## 2023-10-08 NOTE — ED Notes (Signed)
 Pt initially refusing to leave department.  All needs addressed and many options offered on discharge.  Pt continued to decline leaving.  Security and [employer] at bedside speaking with the patient.

## 2023-10-10 ENCOUNTER — Observation Stay (HOSPITAL_COMMUNITY)
Admission: EM | Admit: 2023-10-10 | Discharge: 2023-10-28 | Disposition: A | Discharge status: Home / Self Care | Payer: Medicare HMO | Attending: Family Medicine | Admitting: Family Medicine

## 2023-10-10 ENCOUNTER — Encounter (HOSPITAL_COMMUNITY): Payer: Self-pay

## 2023-10-10 ENCOUNTER — Inpatient Hospital Stay (HOSPITAL_COMMUNITY): Payer: Medicare HMO

## 2023-10-10 ENCOUNTER — Other Ambulatory Visit: Payer: Self-pay

## 2023-10-10 DIAGNOSIS — J189 Pneumonia, unspecified organism: Secondary | ICD-10-CM | POA: Diagnosis not present

## 2023-10-10 DIAGNOSIS — I471 Supraventricular tachycardia, unspecified: Principal | ICD-10-CM | POA: Diagnosis present

## 2023-10-10 DIAGNOSIS — I5032 Chronic diastolic (congestive) heart failure: Secondary | ICD-10-CM | POA: Diagnosis not present

## 2023-10-10 DIAGNOSIS — R0602 Shortness of breath: Secondary | ICD-10-CM

## 2023-10-10 DIAGNOSIS — F419 Anxiety disorder, unspecified: Secondary | ICD-10-CM | POA: Insufficient documentation

## 2023-10-10 DIAGNOSIS — Z8546 Personal history of malignant neoplasm of prostate: Secondary | ICD-10-CM | POA: Insufficient documentation

## 2023-10-10 DIAGNOSIS — I10 Essential (primary) hypertension: Secondary | ICD-10-CM | POA: Diagnosis present

## 2023-10-10 DIAGNOSIS — N182 Chronic kidney disease, stage 2 (mild): Secondary | ICD-10-CM | POA: Insufficient documentation

## 2023-10-10 DIAGNOSIS — I483 Typical atrial flutter: Secondary | ICD-10-CM | POA: Insufficient documentation

## 2023-10-10 DIAGNOSIS — Z86711 Personal history of pulmonary embolism: Secondary | ICD-10-CM | POA: Insufficient documentation

## 2023-10-10 DIAGNOSIS — Z79899 Other long term (current) drug therapy: Secondary | ICD-10-CM | POA: Diagnosis not present

## 2023-10-10 DIAGNOSIS — I4892 Unspecified atrial flutter: Secondary | ICD-10-CM | POA: Diagnosis present

## 2023-10-10 DIAGNOSIS — M25561 Pain in right knee: Secondary | ICD-10-CM | POA: Diagnosis not present

## 2023-10-10 DIAGNOSIS — E1122 Type 2 diabetes mellitus with diabetic chronic kidney disease: Secondary | ICD-10-CM | POA: Diagnosis not present

## 2023-10-10 DIAGNOSIS — M25562 Pain in left knee: Secondary | ICD-10-CM | POA: Diagnosis not present

## 2023-10-10 DIAGNOSIS — F17210 Nicotine dependence, cigarettes, uncomplicated: Secondary | ICD-10-CM | POA: Diagnosis not present

## 2023-10-10 DIAGNOSIS — R000 Tachycardia, unspecified: Secondary | ICD-10-CM | POA: Diagnosis present

## 2023-10-10 DIAGNOSIS — I130 Hypertensive heart and chronic kidney disease with heart failure and stage 1 through stage 4 chronic kidney disease, or unspecified chronic kidney disease: Secondary | ICD-10-CM | POA: Insufficient documentation

## 2023-10-10 DIAGNOSIS — M179 Osteoarthritis of knee, unspecified: Secondary | ICD-10-CM | POA: Insufficient documentation

## 2023-10-10 DIAGNOSIS — J40 Bronchitis, not specified as acute or chronic: Secondary | ICD-10-CM | POA: Insufficient documentation

## 2023-10-10 DIAGNOSIS — D539 Nutritional anemia, unspecified: Secondary | ICD-10-CM | POA: Diagnosis present

## 2023-10-10 DIAGNOSIS — N179 Acute kidney failure, unspecified: Secondary | ICD-10-CM | POA: Diagnosis not present

## 2023-10-10 DIAGNOSIS — Z86718 Personal history of other venous thrombosis and embolism: Secondary | ICD-10-CM | POA: Insufficient documentation

## 2023-10-10 DIAGNOSIS — D509 Iron deficiency anemia, unspecified: Secondary | ICD-10-CM | POA: Diagnosis not present

## 2023-10-10 DIAGNOSIS — R079 Chest pain, unspecified: Secondary | ICD-10-CM | POA: Diagnosis present

## 2023-10-10 DIAGNOSIS — E119 Type 2 diabetes mellitus without complications: Secondary | ICD-10-CM

## 2023-10-10 DIAGNOSIS — R1310 Dysphagia, unspecified: Secondary | ICD-10-CM | POA: Diagnosis not present

## 2023-10-10 DIAGNOSIS — R1312 Dysphagia, oropharyngeal phase: Secondary | ICD-10-CM | POA: Diagnosis not present

## 2023-10-10 DIAGNOSIS — J209 Acute bronchitis, unspecified: Secondary | ICD-10-CM | POA: Diagnosis not present

## 2023-10-10 LAB — CBC
HCT: 38.9 % — ABNORMAL LOW (ref 39.0–52.0)
Hemoglobin: 12.5 g/dL — ABNORMAL LOW (ref 13.0–17.0)
MCH: 32.7 pg (ref 26.0–34.0)
MCHC: 32.1 g/dL (ref 30.0–36.0)
MCV: 101.8 fL — ABNORMAL HIGH (ref 80.0–100.0)
Platelets: 188 10*3/uL (ref 150–400)
RBC: 3.82 MIL/uL — ABNORMAL LOW (ref 4.22–5.81)
RDW: 16.7 % — ABNORMAL HIGH (ref 11.5–15.5)
WBC: 5.3 10*3/uL (ref 4.0–10.5)
nRBC: 0 % (ref 0.0–0.2)

## 2023-10-10 LAB — RAPID URINE DRUG SCREEN, HOSP PERFORMED
Amphetamines: NOT DETECTED
Barbiturates: NOT DETECTED
Benzodiazepines: NOT DETECTED
Cocaine: NOT DETECTED
Opiates: NOT DETECTED
Tetrahydrocannabinol: NOT DETECTED

## 2023-10-10 LAB — IRON AND TIBC
Iron: 93 ug/dL (ref 45–182)
Saturation Ratios: 34 % (ref 17.9–39.5)
TIBC: 273 ug/dL (ref 250–450)
UIBC: 180 ug/dL

## 2023-10-10 LAB — COMPREHENSIVE METABOLIC PANEL
ALT: 14 U/L (ref 0–44)
AST: 20 U/L (ref 15–41)
Albumin: 2.9 g/dL — ABNORMAL LOW (ref 3.5–5.0)
Alkaline Phosphatase: 59 U/L (ref 38–126)
Anion gap: 11 (ref 5–15)
BUN: 15 mg/dL (ref 8–23)
CO2: 19 mmol/L — ABNORMAL LOW (ref 22–32)
Calcium: 8.5 mg/dL — ABNORMAL LOW (ref 8.9–10.3)
Chloride: 109 mmol/L (ref 98–111)
Creatinine, Ser: 1.32 mg/dL — ABNORMAL HIGH (ref 0.61–1.24)
GFR, Estimated: 59 mL/min — ABNORMAL LOW (ref >60)
Glucose, Bld: 93 mg/dL (ref 70–99)
Potassium: 4.3 mmol/L (ref 3.5–5.1)
Sodium: 139 mmol/L (ref 135–145)
Total Bilirubin: 0.6 mg/dL (ref 0.0–1.2)
Total Protein: 6.7 g/dL (ref 6.5–8.1)

## 2023-10-10 LAB — URINALYSIS, COMPLETE (UACMP) WITH MICROSCOPIC
Bacteria, UA: NONE SEEN
Bilirubin Urine: NEGATIVE
Glucose, UA: NEGATIVE mg/dL
Hgb urine dipstick: NEGATIVE
Ketones, ur: NEGATIVE mg/dL
Leukocytes,Ua: NEGATIVE
Nitrite: NEGATIVE
Protein, ur: NEGATIVE mg/dL
Specific Gravity, Urine: 1.017 (ref 1.005–1.030)
pH: 5 (ref 5.0–8.0)

## 2023-10-10 LAB — PROCALCITONIN: Procalcitonin: <0.1 ng/mL

## 2023-10-10 LAB — BRAIN NATRIURETIC PEPTIDE: B Natriuretic Peptide: 812.3 pg/mL — ABNORMAL HIGH (ref 0.0–100.0)

## 2023-10-10 LAB — PROTIME-INR
INR: 1.1 (ref 0.8–1.2)
Prothrombin Time: 14.5 s (ref 11.4–15.2)

## 2023-10-10 LAB — MAGNESIUM: Magnesium: 1.6 mg/dL — ABNORMAL LOW (ref 1.7–2.4)

## 2023-10-10 LAB — FOLATE: Folate: 5.9 ng/mL — ABNORMAL LOW (ref >5.9)

## 2023-10-10 LAB — FERRITIN: Ferritin: 92 ng/mL (ref 24–336)

## 2023-10-10 LAB — SODIUM, URINE, RANDOM: Sodium, Ur: 72 mmol/L

## 2023-10-10 LAB — VALPROIC ACID LEVEL: Valproic Acid Lvl: <10 ug/mL — ABNORMAL LOW (ref 50.0–100.0)

## 2023-10-10 LAB — VITAMIN B12: Vitamin B-12: 149 pg/mL — ABNORMAL LOW (ref 180–914)

## 2023-10-10 LAB — TSH: TSH: 1.226 u[IU]/mL (ref 0.350–4.500)

## 2023-10-10 LAB — ETHANOL: Alcohol, Ethyl (B): <10 mg/dL (ref <10)

## 2023-10-10 LAB — CBG MONITORING, ED: Glucose-Capillary: 149 mg/dL — ABNORMAL HIGH (ref 70–99)

## 2023-10-10 LAB — D-DIMER, QUANTITATIVE: D-Dimer, Quant: 0.51 ug{FEU}/mL — ABNORMAL HIGH (ref 0.00–0.50)

## 2023-10-10 LAB — CREATININE, URINE, RANDOM: Creatinine, Urine: 234 mg/dL

## 2023-10-10 MED ORDER — MELATONIN 3 MG PO TABS: 3.0000 mg | ORAL_TABLET | Freq: Every evening | ORAL | Status: DC | PRN

## 2023-10-10 MED ORDER — ACETAMINOPHEN 500 MG PO TABS
1000.0000 mg | ORAL_TABLET | Freq: Four times a day (QID) | ORAL | Status: DC | PRN
  Administered 2023-10-12: 1000 mg via ORAL
  Filled 2023-10-10: qty 2

## 2023-10-10 MED ORDER — ADENOSINE 6 MG/2ML IV SOLN
INTRAVENOUS | Status: AC
  Administered 2023-10-10: 6 mg
  Filled 2023-10-10: qty 4

## 2023-10-10 MED ORDER — SODIUM CHLORIDE 0.9 % IV SOLN
1.0000 g | INTRAVENOUS | Status: DC
  Administered 2023-10-10 – 2023-10-12 (×3): 1 g via INTRAVENOUS
  Filled 2023-10-10 (×3): qty 10

## 2023-10-10 MED ORDER — ADENOSINE 6 MG/2ML IV SOLN
INTRAVENOUS | Status: AC
  Administered 2023-10-10: 12 mg
  Filled 2023-10-10: qty 2

## 2023-10-10 MED ORDER — DILTIAZEM LOAD VIA INFUSION
10.0000 mg | Freq: Once | INTRAVENOUS | Status: AC
  Administered 2023-10-10: 10 mg via INTRAVENOUS
  Filled 2023-10-10: qty 10

## 2023-10-10 MED ORDER — ONDANSETRON HCL 4 MG/2ML IJ SOLN: 4.0000 mg | Freq: Four times a day (QID) | INTRAMUSCULAR | Status: DC | PRN

## 2023-10-10 MED ORDER — SODIUM CHLORIDE 0.9 % IV SOLN
100.0000 mg | Freq: Two times a day (BID) | INTRAVENOUS | Status: DC
  Administered 2023-10-10 – 2023-10-13 (×6): 100 mg via INTRAVENOUS
  Filled 2023-10-10 (×6): qty 100

## 2023-10-10 MED ORDER — DILTIAZEM HCL-DEXTROSE 125-5 MG/125ML-% IV SOLN (PREMIX)
5.0000 mg/h | INTRAVENOUS | Status: DC
  Administered 2023-10-10: 5 mg/h via INTRAVENOUS
  Filled 2023-10-10: qty 125

## 2023-10-10 MED ORDER — LACTATED RINGERS IV BOLUS
500.0000 mL | Freq: Once | INTRAVENOUS | Status: AC
  Administered 2023-10-10: 500 mL via INTRAVENOUS

## 2023-10-10 MED ORDER — MAGNESIUM SULFATE 2 GM/50ML IV SOLN
2.0000 g | Freq: Once | INTRAVENOUS | Status: AC
  Administered 2023-10-10: 2 g via INTRAVENOUS
  Filled 2023-10-10: qty 50

## 2023-10-10 MED ORDER — INSULIN ASPART 100 UNIT/ML IJ SOLN
0.0000 [IU] | Freq: Three times a day (TID) | INTRAMUSCULAR | Status: DC
  Administered 2023-10-19 (×2): 2 [IU] via SUBCUTANEOUS
  Administered 2023-10-21 – 2023-10-25 (×2): 1 [IU] via SUBCUTANEOUS

## 2023-10-10 MED ORDER — ETOMIDATE 2 MG/ML IV SOLN
5.0000 mg | Freq: Once | INTRAVENOUS | Status: AC
  Administered 2023-10-10: 5 mg via INTRAVENOUS

## 2023-10-10 MED ORDER — ONDANSETRON HCL 4 MG/2ML IJ SOLN
4.0000 mg | Freq: Once | INTRAMUSCULAR | Status: AC
  Administered 2023-10-10: 4 mg via INTRAVENOUS
  Filled 2023-10-10: qty 2

## 2023-10-10 MED ORDER — FENTANYL CITRATE PF 50 MCG/ML IJ SOSY
25.0000 ug | PREFILLED_SYRINGE | Freq: Once | INTRAMUSCULAR | Status: AC
  Administered 2023-10-10: 25 ug via INTRAVENOUS
  Filled 2023-10-10: qty 1

## 2023-10-10 MED ORDER — LORAZEPAM 2 MG/ML IJ SOLN
1.0000 mg | Freq: Once | INTRAMUSCULAR | Status: AC
  Administered 2023-10-10: 1 mg via INTRAVENOUS
  Filled 2023-10-10: qty 1

## 2023-10-10 MED ORDER — ACETAMINOPHEN 650 MG RE SUPP: 650.0000 mg | Freq: Four times a day (QID) | RECTAL | Status: DC | PRN

## 2023-10-10 MED ORDER — BENZONATATE 100 MG PO CAPS: 200.0000 mg | ORAL_CAPSULE | Freq: Three times a day (TID) | ORAL | Status: DC | PRN

## 2023-10-10 MED ORDER — ACETAMINOPHEN 325 MG PO TABS: 650.0000 mg | ORAL_TABLET | Freq: Four times a day (QID) | ORAL | Status: DC | PRN

## 2023-10-10 MED ORDER — SODIUM CHLORIDE 0.9 % IV BOLUS
1000.0000 mL | Freq: Once | INTRAVENOUS | Status: AC
  Administered 2023-10-10: 1000 mL via INTRAVENOUS

## 2023-10-10 NOTE — Consult Note (Signed)
 Cardiology Consultation   Patient ID: Todd [name]. MRN: [id number]; DOB: 12/31/1955  Admit date: 10/10/2023 Date of Consult: 10/10/2023  PCP:  Blima Singer, PA   [facility] Providers Cardiologist:  Nicki Guadalajara, MD        Patient Profile:   Todd [name]. is a 68 y.o. male with a hx of atrial flutter, HTN, RBBB, ETOH/tobacco/heroin/cocaine abuse, hx of PE, DVT, HLD and prostate cancer who is being seen 10/10/2023 for the evaluation of SVT, refractory at the request of Dr. Karene Fry.  History of Present Illness:   Todd [name] is a 68 year old male with above-noted history who presents with probable SVT.  Patient was at a preoperative appointment for his knee when he was noted to be tachycardic.  Upon notification of arrhythmia, patient did notice that he has had some palpitations and lightheadedness.  In the ER he was given adenosine which briefly slowed his rhythm, but this recurred and he was cardioverted with 120 J shock which converted his rhythm to sinus for approximately 30 seconds per ED report.  He again returned to supraventricular tachycardia at which point a second shock was attempted however patient did not tolerate etomidate and procedure was aborted.  He was started on IV diltiazem and spontaneously converted to sinus rhythm.  UDS + for cocaine as recently as 08/08/23. Current drug screen pending.   Previously has a history of atrial flutter for which he was given amiodarone and anticoagulation and a prior hospital consult.  He was also felt to have musculoskeletal chest pain.  Patient is denying significant chest pain today but notes that he hurts all over particularly the right knee.  At home the patient takes amlodipine 5 mg daily.  No other notable cardiovascular medications.  Of note patient was previously recommended to take anticoagulation, I do not see this on his current medication list.  Past Medical History:  Diagnosis Date   Arthritis     "fingers" (07/14/2018)   Depression    DVT (deep venous thrombosis) (HCC) LLE   Hepatitis C     finished harvoni tx ~ 2017   Hypercholesterolemia    Hypertension    Prostate cancer (HCC) 16yrs ago   Pulmonary embolism and infarction (HCC) 07/13/2018   Sleep apnea    not currently using cpap, mask causing vertigo   Type II diabetes mellitus (HCC)     Past Surgical History:  Procedure Laterality Date   BIOPSY  01/12/2019   Procedure: BIOPSY;  Surgeon: Bernette Redbird, MD;  Location: [facility] ENDOSCOPY;  Service: Endoscopy;;   BIOPSY  06/23/2019   Procedure: BIOPSY;  Surgeon: Willis Modena, MD;  Location[facility] ENDOSCOPY;  Service: Endoscopy;;   COLONOSCOPY WITH PROPOFOL N/A 02/19/2014   Procedure: COLONOSCOPY WITH PROPOFOL;  Surgeon: Charolett Bumpers, MD;  Location: [facility] ENDOSCOPY;  Service: Endoscopy;  Laterality: N/A;   ENDOVENOUS ABLATION SAPHENOUS VEIN W/ LASER Left 11/22/2017   endovenous laser ablation L SSV by Josephina Gip MD    ESOPHAGEAL BRUSHING  06/23/2019   Procedure: ESOPHAGEAL BRUSHING;  Surgeon: Willis Modena, MD;  Location: [facility] ENDOSCOPY;  Service: Endoscopy;;   ESOPHAGOGASTRODUODENOSCOPY (EGD) WITH PROPOFOL N/A 01/12/2019   Procedure: ESOPHAGOGASTRODUODENOSCOPY (EGD) WITH PROPOFOL;  Surgeon: Bernette Redbird, MD;  Location: [facility] ENDOSCOPY;  Service: Endoscopy;  Laterality: N/A;  Patient is also scheduled for barium swallow; please notify radiology after patient's EGD is complete so that barium swallow follows the endoscopy, not vice versa   ESOPHAGOGASTRODUODENOSCOPY (EGD) WITH PROPOFOL N/A 06/23/2019  Procedure: ESOPHAGOGASTRODUODENOSCOPY (EGD) WITH PROPOFOL;  Surgeon: Willis Modena, MD;  Location: [facility] ENDOSCOPY;  Service: Endoscopy;  Laterality: N/A;   PROSTATECTOMY  2008   REPAIR QUADRICEPS / HAMSTRING MUSCLE Right      Home Medications:  Prior to Admission medications   Medication Sig Start Date End Date Taking? Authorizing Provider  allopurinol (ZYLOPRIM) 100 MG tablet Take  200 mg by mouth daily. Patient not taking: Reported on 08/08/2023 07/06/23   [provider]  amLODipine (NORVASC) 5 MG tablet Take 1 tablet (5 mg total) by mouth daily. 05/02/23 10/10/23  Sarina Ill, DO  busPIRone (BUSPAR) 10 MG tablet Take 1 tablet (10 mg total) by mouth 2 (two) times daily. 05/02/23   Sarina Ill, DO  divalproex (DEPAKOTE) 500 MG DR tablet Take 1 tablet (500 mg total) by mouth 2 (two) times daily. Patient not taking: Reported on 08/08/2023 05/02/23   Sarina Ill, DO  doxycycline (VIBRA-TABS) 100 MG tablet Take 1 tablet (100 mg total) by mouth 2 (two) times daily. 10/08/23   Linwood Dibbles, MD  methocarbamol (ROBAXIN) 750 MG tablet Take 750 mg by mouth 3 (three) times daily. 07/06/23   [provider]  naproxen (NAPROSYN) 250 MG tablet Take 250 mg by mouth. 10/06/23   [provider]  oxycodone (OXY-IR) 5 MG capsule Take 2 capsules (10 mg total) by mouth every 6 (six) hours as needed. 10/08/23   Linwood Dibbles, MD  predniSONE (DELTASONE) 50 MG tablet Take 1 tablet (50 mg total) by mouth daily. 10/08/23   Linwood Dibbles, MD  sertraline (ZOLOFT) 50 MG tablet Take 3 tablets (150 mg total) by mouth daily. Patient taking differently: Take 100 mg by mouth daily. 05/03/23   Sarina Ill, DO  traZODone (DESYREL) 100 MG tablet Take 2 tablets (200 mg total) by mouth at bedtime. Patient taking differently: Take 100 mg by mouth at bedtime. 05/02/23   Sarina Ill, DO  Vitamin D, Ergocalciferol, (DRISDOL) 1.25 MG (50000 UNIT) CAPS capsule Take 50,000 Units by mouth every 7 (seven) days. Patient not taking: Reported on 08/08/2023 07/06/23   [provider]  XTAMPZA ER 9 MG C12A Take 1 capsule by mouth 2 (two) times daily. 07/06/23   [provider]    Inpatient Medications: Scheduled Meds:   Continuous Infusions:  diltiazem (CARDIZEM) infusion 5 mg/hr (10/10/23 1955)   magnesium sulfate bolus IVPB      PRN Meds: acetaminophen **OR** acetaminophen, melatonin, ondansetron (ZOFRAN) IV  Allergies:   No Known Allergies  Social History:   Social History   Socioeconomic History   Marital status: Single    Spouse name: Not on file   Number of children: 2   Years of education: 12th   Highest education level: Not on file  Occupational History   Occupation: post office  Tobacco Use   Smoking status: Every Day    Current packs/day: 0.12    Average packs/day: 0.1 packs/day for 45.0 years (5.4 ttl pk-yrs)    Types: Cigarettes   Smokeless tobacco: Never  Vaping Use   Vaping status: Never Used  Substance and Sexual Activity   Alcohol use: Yes   Drug use: Yes   Sexual activity: Not Currently  Other Topics Concern   Not on file  Social History Narrative   ** Merged History Encounter **       Patient lives at home alone.Marland KitchenMarland KitchenDrinks coffee daily    Social Drivers of Corporate investment banker Strain: Low Risk  (  10/27/2022)   Received from [facility]   Overall Financial Resource Strain (CARDIA)    Difficulty of Paying Living Expenses: Not very hard  Food Insecurity: High Risk (08/24/2023)   Received from [facility]   Food Insecurity    Within the past 12 months, did the food you bought just not last and you didn't have money to get more?: Yes    Within the past 12 months, did you worry that your food would run out before you got money to buy more?: Yes  Transportation Needs: High Risk (08/24/2023)   Received from [facility]   Transportation Needs    Within the past 12 months, has a lack of transportation kept you from medical appointments or from doing things needed for daily living?: Yes  Physical Activity: Inactive (10/27/2022)   Received from [facility]   Exercise Vital Sign    Days of Exercise per Week: 0 days    Minutes of Exercise per Session: 0 min  Stress: Stress Concern Present (10/27/2022)   Received from [facility] of Occupational Health - Occupational Stress Questionnaire    Feeling of Stress : To some extent  Social Connections: Socially Isolated (10/27/2022)   Received from [facility]   Social Connection and Isolation Panel [NHANES]    Frequency of Communication with Friends and Family: More than three times a week    Frequency of Social Gatherings with Friends and Family: More than three times a week    Attends Religious Services: Never    Database administrator or Organizations: No    Attends Banker Meetings: Never    Marital Status: Divorced  Catering manager Violence: Not At Risk (04/20/2023)   Humiliation, Afraid, Rape, and Kick questionnaire    Fear of Current or Ex-Partner: No    Emotionally Abused: No    Physically Abused: No    Sexually Abused: No    Family History:    Family History  Problem Relation Age of Onset   Cancer Father        PROSTATE     ROS:  Please see the history of present illness.   All other ROS reviewed and negative.     Physical Exam/Data:   Vitals:   10/10/23 1851 10/10/23 1855 10/10/23 1902 10/10/23 1956  BP: 104/83 102/70 109/87   Pulse: (!) 148 (!) 140 (!) 144   Resp: 16 17 12    Temp: 98 F (36.7 C)  98.1 F (36.7 C)   TempSrc: Oral  Oral   SpO2: 97% 98% 98% 97%  Weight:      Height:        Intake/Output Summary (Last 24 hours) at 10/10/2023 2037 Last data filed at 10/10/2023 1600 Gross per 24 hour  Intake 1500 ml  Output --  Net 1500 ml      10/10/2023    1:59 PM 08/08/2023    4:28 PM 05/11/2023    4:05 PM  Last 3 Weights  Weight (lbs) 200 lb 208 lb 203 lb 14.8 oz  Weight (kg) 90.719 kg 94.348 kg 92.5 kg     Body mass index is 29.53 kg/m.  Constitutional: Mildly distressed with knee pain appears drowsy. ENMT: moist mucous membranes Cardiovascular: regular rhythm, normal rate, no murmur. S1 and S2 normal. No jugular venous distention.  Respiratory: clear to auscultation bilaterally. GI : normal bowel  sounds, soft and nontender. No distention.  MSK: extremities warm, well perfused. No edema.  NEURO: grossly nonfocal exam, moves all extremities. PSYCH: Drowsy but responds to questions, unable to assess orientation   EKG:  The EKG was personally reviewed and demonstrates: Supraventricular tachycardia Telemetry:  Telemetry was personally reviewed and demonstrates: Sinus rhythm  Relevant CV Studies: None  Laboratory Data:  High Sensitivity Troponin:  No results for input(s): "TROPONINIHS" in the last 720 hours.   Chemistry Recent Labs  Lab 10/10/23 1425 10/10/23 1428  NA  --  139  K  --  4.3  CL  --  109  CO2  --  19*  GLUCOSE  --  93  BUN  --  15  CREATININE  --  1.32*  CALCIUM  --  8.5*  MG 1.6*  --   GFRNONAA  --  59*  ANIONGAP  --  11    Recent Labs  Lab 10/10/23 1428  PROT 6.7  ALBUMIN 2.9*  AST 20  ALT 14  ALKPHOS 59  BILITOT 0.6   Lipids No results for input(s): "CHOL", "TRIG", "HDL", "LABVLDL", "LDLCALC", "CHOLHDL" in the last 168 hours.  Hematology Recent Labs  Lab 10/10/23 1428  WBC 5.3  RBC 3.82*  HGB 12.5*  HCT 38.9*  MCV 101.8*  MCH 32.7  MCHC 32.1  RDW 16.7*  PLT 188   Thyroid No results for input(s): "TSH", "FREET4" in the last 168 hours.  BNPNo results for input(s): "BNP", "PROBNP" in the last 168 hours.  DDimer  Recent Labs  Lab 10/10/23 1428  DDIMER 0.51*     Radiology/Studies:  DG Shoulder Right Result Date: 10/08/2023 CLINICAL DATA:  Cough and joint aches. EXAM: RIGHT SHOULDER - 2+ VIEW COMPARISON:  Right shoulder x-ray 02/15/2022 FINDINGS: There is no acute fracture or dislocation. There severe subacromial joint space narrowing. Bones are osteopenic. Soft tissues are within normal limits. IMPRESSION: 1. No acute fracture or dislocation. 2. Severe subacromial joint space narrowing can be seen with chronic rotator cuff tendinopathy. Electronically Signed   By: Darliss Cheney M.D.   On: 10/08/2023 20:05   DG Knee 2 Views  Right Result Date: 10/08/2023 CLINICAL DATA:  Right knee pain EXAM: RIGHT KNEE - 1-2 VIEW COMPARISON:  05/11/2023 FINDINGS: Frontal and lateral views of the right knee demonstrate moderate 3 compartmental osteoarthritis, with chondrocalcinosis again noted in the lateral compartment. Patella baja again noted, with continued calcifications in the distal quadriceps tendon region. No acute fracture. Small joint effusion. Soft tissues are unremarkable. IMPRESSION: 1. Stable 3 compartmental osteoarthritis and small reactive joint effusion. 2. Stable patella baja, with calcifications within the distal quadriceps tendon unchanged. Electronically Signed   By: Sharlet Salina M.D.   On: 10/08/2023 20:02   DG Knee 2 Views Left Result Date: 10/08/2023 CLINICAL DATA:  Pain EXAM: LEFT KNEE - 1-2 VIEW COMPARISON:  02/15/2022 FINDINGS: Frontal and lateral views of the left knee are obtained. No fracture, subluxation, or dislocation. There is severe 3 compartmental osteoarthritis, most pronounced in the lateral compartment. Prominent enthesopathic changes along the upper pole of the patella. Small joint effusion. Soft tissues are unremarkable. IMPRESSION: 1. Severe 3 compartmental osteoarthritis and small reactive joint effusion. 2. No acute fracture. Electronically Signed   By: Sharlet Salina M.D.   On: 10/08/2023 20:01   DG Shoulder Left Result Date: 10/08/2023 CLINICAL DATA:  Shoulder pain EXAM: LEFT SHOULDER - 2+ VIEW COMPARISON:  02/15/2022 FINDINGS: Frontal and transscapular views of the left shoulder are obtained. No acute displaced fracture. Prominent spurring along  the undersurface of the acromion process. Moderate acromioclavicular joint osteoarthritis. Glenohumeral joint space is well preserved. Soft tissues are unremarkable. Left chest is clear. IMPRESSION: 1. Stable acromioclavicular joint osteoarthritis and spurring of the acromion process. 2. No acute displaced fracture. Electronically Signed   By: Sharlet Salina M.D.   On: 10/08/2023 20:00   DG Chest 2 View Result Date: 10/08/2023 CLINICAL DATA:  Cough EXAM: CHEST - 2 VIEW COMPARISON:  08/30/2021 FINDINGS: Frontal and lateral views of the chest demonstrate a stable cardiac silhouette. Background emphysema and parenchymal lung scarring again noted. There is increased density in the left suprahilar region which may reflect superimposed airspace disease. No effusion or pneumothorax. No acute bony abnormalities. IMPRESSION: 1. Increased density in the left suprahilar region, consistent with airspace disease superimposed upon chronic scarring and emphysema. Electronically Signed   By: Sharlet Salina M.D.   On: 10/08/2023 19:56     Assessment and Plan:   Principal Problem:   SVT (supraventricular tachycardia) (HCC)  SVT-patient does have a history of atrial flutter, I do not feel that he is currently anticoagulated, nevertheless presenting rhythm was felt to be more consistent with SVT given response to adenosine.  Possible that this represents recurrence of atrial arrhythmia, patient may benefit from cardiac monitor home-going.  Patient may benefit from AV nodal blocking therapy but would await results of drug screen prior to medication selection and may want to await results of echocardiogram to ensure her EF is normal.  At this time, patient is in sinus rhythm and no additional medical therapy is required.  Patient continues on IV diltiazem infusion, okay to continue given no clear signs of decompensated heart failure.  TSH is pending.  Most recent echocardiogram from January 2023 demonstrates EF 60 to 65% mild LVH, grade 1 diastolic dysfunction, moderate LA dilation, abnormal mitral valve with trivial regurgitation.  Acute kidney injury-patient has been volume resuscitated, recheck in a.m.    Risk Assessment/Risk Scores:          CHA2DS2-VASc Score = 3   This indicates a 3.2% annual risk of stroke. The patient's score is based upon: CHF  History: 0 HTN History: 1 Diabetes History: 1 Stroke History: 0 Vascular Disease History: 0 Age Score: 1 Gender Score: 0         For questions or updates, please contact [facility] Please consult [email or web address] for contact info under    Signed, Parke Poisson, MD  10/10/2023 8:37 PM

## 2023-10-10 NOTE — ED Provider Notes (Addendum)
 Physical Exam  BP 104/80   Pulse (!) 147   Temp 98 F (36.7 C) (Oral)   Resp 17   Ht 5\' 9"  (1.753 m)   Wt 90.7 kg   SpO2 95%   BMI 29.53 kg/m   Physical Exam  Procedures  .Cardioversion  Date/Time: 10/10/2023 6:41 PM  Performed by: Ernie Avena, MD Authorized by: Ernie Avena, MD   Consent:    Consent obtained:  Written   Consent given by:  Patient   Risks discussed:  Cutaneous burn, death, induced arrhythmia and pain Pre-procedure details:    Cardioversion basis:  Emergent   Rhythm:  Supraventricular tachycardia   Electrode placement:  Anterior-posterior Patient sedated: Yes. Refer to sedation procedure documentation for details of sedation.  Attempt one:    Cardioversion mode:  Synchronous   Waveform:  Biphasic   Shock (Joules):  120   Shock outcome:  No change in rhythm Comments:     Procedure unsuccessful, terminated after initial shock of 120J due to significant myoclonus, increased secretions requiring suctioning and airway repositioning. No hypoxic events and the patient subsequently recovered to his previous baseline   .Sedation  Date/Time: 10/10/2023 6:41 PM  Performed by: Ernie Avena, MD Authorized by: Ernie Avena, MD   Consent:    Consent obtained:  Written   Consent given by:  Patient   Risks discussed:  Allergic reaction, prolonged hypoxia resulting in organ damage, dysrhythmia, prolonged sedation necessitating reversal, inadequate sedation, respiratory compromise necessitating ventilatory assistance and intubation, nausea and vomiting Universal protocol:    Immediately prior to procedure, a time out was called: yes     Patient identity confirmed:  Arm band and verbally with patient Indications:    Procedure performed:  Cardioversion   Procedure necessitating sedation performed by:  Physician performing sedation Pre-sedation assessment:    Time since last food or drink:  24 hrs   ASA classification: class 3 - patient with severe  systemic disease     Mouth opening:  2 finger widths   Thyromental distance:  3 finger widths   Mallampati score:  IV - only hard palate visible   Neck mobility: normal     Pre-sedation assessments completed and reviewed: airway patency, cardiovascular function, hydration status, mental status, nausea/vomiting, pain level, respiratory function and temperature   A pre-sedation assessment was completed prior to the start of the procedure Immediate pre-procedure details:    Reassessment: Patient reassessed immediately prior to procedure     Reviewed: vital signs, relevant labs/tests and NPO status     Verified: bag valve mask available, emergency equipment available, intubation equipment available, IV patency confirmed, oxygen available and reversal medications available   Procedure details (see MAR for exact dosages):    Preoxygenation:  Nasal cannula   Sedation:  Etomidate   Intended level of sedation: deep   Intra-procedure monitoring:  Blood pressure monitoring, cardiac monitor, continuous capnometry, continuous pulse oximetry, frequent LOC assessments and frequent vital sign checks   Intra-procedure events comment:  Significant myoclonus   Intra-procedure management:  Airway repositioning   Total Provider sedation time (minutes):  10 Post-procedure details:   A post-sedation assessment was completed following the completion of the procedure.   Attendance: Constant attendance by certified staff until patient recovered     Recovery: Patient returned to pre-procedure baseline     Post-sedation assessments completed and reviewed: airway patency, cardiovascular function, hydration status, mental status, nausea/vomiting, pain level, respiratory function and temperature   Comments:     Procedure  unsuccessful, terminated after initial shock of 120J due to significant myoclonus, increased secretions requiring suctioning and airway repositioning. No hypoxic events and the patient subsequently  recovered to his previous baseline .Critical Care  Performed by: Ernie Avena, MD Authorized by: Ernie Avena, MD   Critical care provider statement:    Critical care time (minutes):  80   Critical care was time spent personally by me on the following activities:  Development of treatment plan with patient or surrogate, discussions with consultants, evaluation of patient's response to treatment, examination of patient, ordering and review of laboratory studies, ordering and review of radiographic studies, ordering and performing treatments and interventions, pulse oximetry, re-evaluation of patient's condition and review of old charts   Care discussed with: admitting provider     ED Course / MDM    Medical Decision Making Amount and/or Complexity of Data Reviewed Labs: ordered.  Risk Prescription drug management. Decision regarding hospitalization.   79M presenting from pre-op clinic with SOB for 2 months, found to be in SVT. Has hx of PE/DVT, hx of AC. Initially received 6mg  Adenosine and converted to NSR. Currently hemodynamically stable.    Laboratory evaluation significant for Depakote level less than 10, D-dimer negative after adjustment for age at 0.51, CBC without a leukocytosis, ethanol level normal, mild anemia to 12.5, CMP with mild non-anion gap acidosis with a bicarbonate of 19, anion gap 11, serum creatinine mildly elevated concerning for AKI with a creatinine of 1.32 from baseline of 1.  Patient was fluid resuscitated with 1.5 L of both LR and NaCl.    I spoke with on-call cardiology regarding the patient's rhythm.  Dr. Antoine Poche of cardiology agrees that the rhythm is likely SVT.  Safe for cardioversion at this time.  Initial attempts were made with modified vagal maneuvers which were unsuccessful.  The patient was then administered 6 mg and subsequently 12 mg of etomidate.  This resulted in brief conversion to normal sinus rhythm however the patient subsequently  within 30 seconds went back into SVT.  Cardioversion was attempted under procedural sedation with etomidate and after shock of 120 J procedure was aborted as the patient had a reaction/intolerance to the etomidate with significant myoclonus after only 5mg . Procedure unsuccessful, terminated after initial shock of 120J due to significant myoclonus, increased secretions requiring suctioning and airway repositioning. No hypoxic events and the patient subsequently recovered to his previous baseline  The initial shock briefly converted the patient to NSR however within around 10 seconds after a pause and brief run of NSR the patient went back into SVT.  I spoke with on-call cardiology with plan for inpatient admission, spoke with Dr. Jacques Navy who recommended medicine admission, agreed with Dily, check TSH, will see in consultation. Dr. Arlean Hopping was consulted and accepted the patient in admission.    Ernie Avena, MD 10/10/23 Eldred Manges    Ernie Avena, MD 10/10/23 2055

## 2023-10-10 NOTE — ED Notes (Signed)
 CCMD called.

## 2023-10-10 NOTE — H&P (Incomplete)
 History and Physical      Todd [name]. GLO:[id number] DOB: 06-Aug-1956 DOA: 10/10/2023; DOS: 10/10/2023  PCP: Blima Singer, PA *** Patient coming from: home ***  I have personally briefly reviewed patient's old medical records in [facility]  Chief Complaint: ***  HPI: Todd [name]. is a 68 y.o. male with medical history significant for *** who is admitted to [facility] on 10/10/2023 with *** after presenting from home*** to [facility] ED complaining of ***.    ***       ***   ED Course:  Vital signs in the ED were notable for the following: ***  Labs were notable for the following: ***  Per my interpretation, EKG in ED demonstrated the following:  ***  Imaging in the ED, per corresponding formal radiology read, was notable for the following:  ***  EDP has discussed case with on-call cardiology, Dr. Jacques Navy of [facility] cardiology, who will formally consult, and recommends [facility] admission, diltiazem drip, and to check TSH.   While in the ED, the following were administered: ***  Subsequently, the patient was admitted  ***  ***red    Review of Systems: As per HPI otherwise 10 point review of systems negative.   Past Medical History:  Diagnosis Date   Arthritis    "fingers" (07/14/2018)   Depression    DVT (deep venous thrombosis) (HCC) LLE   Hepatitis C     finished harvoni tx ~ 2017   Hypercholesterolemia    Hypertension    Prostate cancer (HCC) 53yrs ago   Pulmonary embolism and infarction (HCC) 07/13/2018   Sleep apnea    not currently using cpap, mask causing vertigo   Type II diabetes mellitus (HCC)     Past Surgical History:  Procedure Laterality Date   BIOPSY  01/12/2019   Procedure: BIOPSY;  Surgeon: Bernette Redbird, MD;  Location: [facility] ENDOSCOPY;  Service: Endoscopy;;   BIOPSY  06/23/2019   Procedure: BIOPSY;  Surgeon: Willis Modena, MD;  Location: [facility] ENDOSCOPY;  Service: Endoscopy;;   COLONOSCOPY WITH PROPOFOL N/A 02/19/2014    Procedure: COLONOSCOPY WITH PROPOFOL;  Surgeon: Charolett Bumpers, MD;  Location: [facility] ENDOSCOPY;  Service: Endoscopy;  Laterality: N/A;   ENDOVENOUS ABLATION SAPHENOUS VEIN W/ LASER Left 11/22/2017   endovenous laser ablation L SSV by Josephina Gip MD    ESOPHAGEAL BRUSHING  06/23/2019   Procedure: ESOPHAGEAL BRUSHING;  Surgeon: Willis Modena, MD;  Location: [facility] ENDOSCOPY;  Service: Endoscopy;;   ESOPHAGOGASTRODUODENOSCOPY (EGD) WITH PROPOFOL N/A 01/12/2019   Procedure: ESOPHAGOGASTRODUODENOSCOPY (EGD) WITH PROPOFOL;  Surgeon: Bernette Redbird, MD;  Location: [facility] ENDOSCOPY;  Service: Endoscopy;  Laterality: N/A;  Patient is also scheduled for barium swallow; please notify radiology after patient's EGD is complete so that barium swallow follows the endoscopy, not vice versa   ESOPHAGOGASTRODUODENOSCOPY (EGD) WITH PROPOFOL N/A 06/23/2019   Procedure: ESOPHAGOGASTRODUODENOSCOPY (EGD) WITH PROPOFOL;  Surgeon: Willis Modena, MD;  Location: [facility] ENDOSCOPY;  Service: Endoscopy;  Laterality: N/A;   PROSTATECTOMY  2008   REPAIR QUADRICEPS / HAMSTRING MUSCLE Right     Social History:  reports that he has been smoking cigarettes. He has a 5.4 pack-year smoking history. He has never used smokeless tobacco. He reports current alcohol use. He reports current drug use.   No Known Allergies  Family History  Problem Relation Age of Onset   Cancer Father        PROSTATE    Family history reviewed and not pertinent ***   Prior  to Admission medications   Medication Sig Start Date End Date Taking? Authorizing Provider  allopurinol (ZYLOPRIM) 100 MG tablet Take 200 mg by mouth daily. Patient not taking: Reported on 08/08/2023 07/06/23   [provider]  amLODipine (NORVASC) 5 MG tablet Take 1 tablet (5 mg total) by mouth daily. Patient not taking: Reported on 08/08/2023 05/02/23 06/01/23  Sarina Ill, DO  busPIRone (BUSPAR) 10 MG tablet Take 1 tablet (10 mg total) by mouth 2 (two) times  daily. 05/02/23   Sarina Ill, DO  divalproex (DEPAKOTE) 500 MG DR tablet Take 1 tablet (500 mg total) by mouth 2 (two) times daily. Patient not taking: Reported on 08/08/2023 05/02/23   Sarina Ill, DO  doxycycline (VIBRA-TABS) 100 MG tablet Take 1 tablet (100 mg total) by mouth 2 (two) times daily. 10/08/23   Linwood Dibbles, MD  methocarbamol (ROBAXIN) 750 MG tablet Take 750 mg by mouth 3 (three) times daily. 07/06/23   [provider]  oxycodone (OXY-IR) 5 MG capsule Take 2 capsules (10 mg total) by mouth every 6 (six) hours as needed. 10/08/23   Linwood Dibbles, MD  predniSONE (DELTASONE) 50 MG tablet Take 1 tablet (50 mg total) by mouth daily. 10/08/23   Linwood Dibbles, MD  sertraline (ZOLOFT) 50 MG tablet Take 3 tablets (150 mg total) by mouth daily. Patient taking differently: Take 100 mg by mouth daily. 05/03/23   Sarina Ill, DO  traZODone (DESYREL) 100 MG tablet Take 2 tablets (200 mg total) by mouth at bedtime. Patient taking differently: Take 100 mg by mouth at bedtime. 05/02/23   Sarina Ill, DO  Vitamin D, Ergocalciferol, (DRISDOL) 1.25 MG (50000 UNIT) CAPS capsule Take 50,000 Units by mouth every 7 (seven) days. Patient not taking: Reported on 08/08/2023 07/06/23   [provider]  XTAMPZA ER 9 MG C12A Take 1 capsule by mouth 2 (two) times daily. 07/06/23   [provider]     Objective    Physical Exam: Vitals:   10/10/23 1851 10/10/23 1855 10/10/23 1902 10/10/23 1956  BP: 104/83 102/70 109/87   Pulse: (!) 148 (!) 140 (!) 144   Resp: 16 17 12    Temp: 98 F (36.7 C)  98.1 F (36.7 C)   TempSrc: Oral  Oral   SpO2: 97% 98% 98% 97%  Weight:      Height:        General: appears to be stated age; alert, oriented Skin: warm, dry, no rash Head:  AT/[place] Mouth:  Oral mucosa membranes appear moist, normal dentition Neck: supple; trachea midline Heart:  RRR; did not appreciate any M/R/G Lungs: CTAB, did not appreciate  any wheezes, rales, or rhonchi Abdomen: + BS; soft, ND, NT Vascular: 2+ pedal pulses b/l; 2+ radial pulses b/l Extremities: no peripheral edema, no muscle wasting Neuro: strength and sensation intact in upper and lower extremities b/l ***   *** Neuro: 5/5 strength of the proximal and distal flexors and extensors of the upper and lower extremities bilaterally; sensation intact in upper and lower extremities b/l; cranial nerves II through XII grossly intact; no pronator drift; no evidence suggestive of slurred speech, dysarthria, or facial droop; Normal muscle tone. No tremors.  *** Neuro: In the setting of the patient's current mental status and associated inability to follow instructions, unable to perform full neurologic exam at this time.  As such, assessment of strength, sensation, and cranial nerves is limited at this time. Patient noted to spontaneously move all 4 extremities.  No tremors.  ***    Labs on Admission: I have personally reviewed following labs and imaging studies  CBC: Recent Labs  Lab 10/10/23 1428  WBC 5.3  HGB 12.5*  HCT 38.9*  MCV 101.8*  PLT 188   Basic Metabolic Panel: Recent Labs  Lab 10/10/23 1428  NA 139  K 4.3  CL 109  CO2 19*  GLUCOSE 93  BUN 15  CREATININE 1.32*  CALCIUM 8.5*   GFR: Estimated Creatinine Clearance: 60.4 mL/min (A) (by C-G formula based on SCr of 1.32 mg/dL (H)). Liver Function Tests: Recent Labs  Lab 10/10/23 1428  AST 20  ALT 14  ALKPHOS 59  BILITOT 0.6  PROT 6.7  ALBUMIN 2.9*   No results for input(s): "LIPASE", "AMYLASE" in the last 168 hours. No results for input(s): "AMMONIA" in the last 168 hours. Coagulation Profile: No results for input(s): "INR", "PROTIME" in the last 168 hours. Cardiac Enzymes: No results for input(s): "CKTOTAL", "CKMB", "CKMBINDEX", "TROPONINI" in the last 168 hours. BNP (last 3 results) No results for input(s): "PROBNP" in the last 8760 hours. HbA1C: No results for input(s):  "HGBA1C" in the last 72 hours. CBG: No results for input(s): "GLUCAP" in the last 168 hours. Lipid Profile: No results for input(s): "CHOL", "HDL", "LDLCALC", "TRIG", "CHOLHDL", "LDLDIRECT" in the last 72 hours. Thyroid Function Tests: No results for input(s): "TSH", "T4TOTAL", "FREET4", "T3FREE", "THYROIDAB" in the last 72 hours. Anemia Panel: No results for input(s): "VITAMINB12", "FOLATE", "FERRITIN", "TIBC", "IRON", "RETICCTPCT" in the last 72 hours. Urine analysis:    Component Value Date/Time   COLORURINE YELLOW 08/30/2021 1420   APPEARANCEUR CLOUDY (A) 08/30/2021 1420   LABSPEC 1.020 08/30/2021 1420   PHURINE 6.0 08/30/2021 1420   GLUCOSEU NEGATIVE 08/30/2021 1420   HGBUR LARGE (A) 08/30/2021 1420   BILIRUBINUR NEGATIVE 08/30/2021 1420   KETONESUR NEGATIVE 08/30/2021 1420   PROTEINUR 100 (A) 08/30/2021 1420   UROBILINOGEN 0.2 12/24/2009 1944   NITRITE POSITIVE (A) 08/30/2021 1420   LEUKOCYTESUR LARGE (A) 08/30/2021 1420    Radiological Exams on Admission: No results found.    Assessment/Plan   Principal Problem:   SVT (supraventricular tachycardia) (HCC)   ***            ***                ***                 ***               ***               ***               ***                ***               ***               ***               ***               ***              ***          ***  DVT prophylaxis: SCD's ***  Code Status: Full code*** Family Communication: none*** Disposition Plan: Per Rounding Team Consults called: none***;  Admission status: ***     I SPENT GREATER THAN 75 *** MINUTES  IN CLINICAL CARE TIME/MEDICAL DECISION-MAKING IN COMPLETING THIS ADMISSION.      Chaney Born Tessah Patchen DO [facility]  From 7PM - 7AM   10/10/2023, 8:02 PM   ***

## 2023-10-10 NOTE — ED Triage Notes (Signed)
 Pt bib ems from doctor office for bilateral knee pain. Pt HR 170 SVT. Pt received 6 mg adenosine which converted NSR.   BP 116/76 HR 88 RR 18 SPO2 98%

## 2023-10-10 NOTE — ED Provider Notes (Signed)
 [facility] EMERGENCY DEPARTMENT AT [facility] Provider Note   CSN: [id number] Arrival date & time: 10/10/23  1322     History  Chief Complaint  Patient presents with   Tachycardia    Todd [name]. is a 68 y.o. male.  HPI 68 year old male history of diabetes, PE, depression, presents today from doctor's office with heart rate of 170.  He reports that he went into see a doctor there to be cleared for knee surgery.  He states that he did not know his heart rate was high but has been having some palpitations, generalized weakness, lightheadedness for months.  He does not know that he has any similar symptoms in the past.     Home Medications Prior to Admission medications   Medication Sig Start Date End Date Taking? Authorizing Provider  allopurinol (ZYLOPRIM) 100 MG tablet Take 200 mg by mouth daily. Patient not taking: Reported on 08/08/2023 07/06/23   [provider]  amLODipine (NORVASC) 5 MG tablet Take 1 tablet (5 mg total) by mouth daily. Patient not taking: Reported on 08/08/2023 05/02/23 06/01/23  Sarina Ill, DO  busPIRone (BUSPAR) 10 MG tablet Take 1 tablet (10 mg total) by mouth 2 (two) times daily. 05/02/23   Sarina Ill, DO  divalproex (DEPAKOTE) 500 MG DR tablet Take 1 tablet (500 mg total) by mouth 2 (two) times daily. Patient not taking: Reported on 08/08/2023 05/02/23   Sarina Ill, DO  doxycycline (VIBRA-TABS) 100 MG tablet Take 1 tablet (100 mg total) by mouth 2 (two) times daily. 10/08/23   Linwood Dibbles, MD  methocarbamol (ROBAXIN) 750 MG tablet Take 750 mg by mouth 3 (three) times daily. 07/06/23   [provider]  oxycodone (OXY-IR) 5 MG capsule Take 2 capsules (10 mg total) by mouth every 6 (six) hours as needed. 10/08/23   Linwood Dibbles, MD  predniSONE (DELTASONE) 50 MG tablet Take 1 tablet (50 mg total) by mouth daily. 10/08/23   Linwood Dibbles, MD  sertraline (ZOLOFT) 50 MG tablet Take 3 tablets (150 mg  total) by mouth daily. Patient taking differently: Take 100 mg by mouth daily. 05/03/23   Sarina Ill, DO  traZODone (DESYREL) 100 MG tablet Take 2 tablets (200 mg total) by mouth at bedtime. Patient taking differently: Take 100 mg by mouth at bedtime. 05/02/23   Sarina Ill, DO  Vitamin D, Ergocalciferol, (DRISDOL) 1.25 MG (50000 UNIT) CAPS capsule Take 50,000 Units by mouth every 7 (seven) days. Patient not taking: Reported on 08/08/2023 07/06/23   [provider]  XTAMPZA ER 9 MG C12A Take 1 capsule by mouth 2 (two) times daily. 07/06/23   [provider]      Allergies    Patient has no known allergies.    Review of Systems   Review of Systems  Physical Exam Updated Vital Signs BP 104/80   Pulse (!) 147   Temp 98 F (36.7 C) (Oral)   Resp 17   Ht 1.753 m (5\' 9" )   Wt 90.7 kg   SpO2 95%   BMI 29.53 kg/m  Physical Exam Vitals and nursing note reviewed.  Constitutional:      Appearance: Normal appearance.  HENT:     Head: Normocephalic.     Right Ear: External ear normal.     Left Ear: External ear normal.     Nose: Nose normal.     Mouth/Throat:     Mouth: Mucous membranes are moist.  Pharynx: Oropharynx is clear.  Eyes:     Pupils: Pupils are equal, round, and reactive to light.  Cardiovascular:     Rate and Rhythm: Regular rhythm. Tachycardia present.     Pulses: Normal pulses.  Pulmonary:     Effort: Pulmonary effort is normal.     Breath sounds: Normal breath sounds.  Abdominal:     General: Abdomen is flat.     Palpations: Abdomen is soft.  Musculoskeletal:        General: Normal range of motion.     Cervical back: Normal range of motion.     Comments: Left knee with pressure stocking in place No swelling of calf or thigh  Skin:    Capillary Refill: Capillary refill takes less than 2 seconds.  Neurological:     General: No focal deficit present.     Mental Status: He is alert and oriented to person, place,  and time.     Cranial Nerves: No cranial nerve deficit.     Motor: No weakness.     Gait: Gait normal.  Psychiatric:        Mood and Affect: Mood normal.     ED Results / Procedures / Treatments   Labs (all labs ordered are listed, but only abnormal results are displayed) Labs Reviewed  CBC - Abnormal; Notable for the following components:      Result Value   RBC 3.82 (*)    Hemoglobin 12.5 (*)    HCT 38.9 (*)    MCV 101.8 (*)    RDW 16.7 (*)    All other components within normal limits  D-DIMER, QUANTITATIVE - Abnormal; Notable for the following components:   D-Dimer, Quant 0.51 (*)    All other components within normal limits  ETHANOL  COMPREHENSIVE METABOLIC PANEL  VALPROIC ACID LEVEL  RAPID URINE DRUG SCREEN, HOSP PERFORMED  I-STAT CHEM 8, ED    EKG EKG Interpretation Date/Time:  Monday October 10 2023 13:47:15 EST Ventricular Rate:  160 PR Interval:  232 QRS Duration:  118 QT Interval:  316 QTC Calculation: 516 R Axis:   -59  Text Interpretation: Supraventricular tachycardia RBBB and LAFB Confirmed by Margarita Grizzle [phone]) on 10/10/2023 2:44:11 PM  Radiology DG Shoulder Right Result Date: 10/08/2023 CLINICAL DATA:  Cough and joint aches. EXAM: RIGHT SHOULDER - 2+ VIEW COMPARISON:  Right shoulder x-Ceejay Kegley 02/15/2022 FINDINGS: There is no acute fracture or dislocation. There severe subacromial joint space narrowing. Bones are osteopenic. Soft tissues are within normal limits. IMPRESSION: 1. No acute fracture or dislocation. 2. Severe subacromial joint space narrowing can be seen with chronic rotator cuff tendinopathy. Electronically Signed   By: Darliss Cheney M.D.   On: 10/08/2023 20:05   DG Knee 2 Views Right Result Date: 10/08/2023 CLINICAL DATA:  Right knee pain EXAM: RIGHT KNEE - 1-2 VIEW COMPARISON:  05/11/2023 FINDINGS: Frontal and lateral views of the right knee demonstrate moderate 3 compartmental osteoarthritis, with chondrocalcinosis again noted in the  lateral compartment. Patella baja again noted, with continued calcifications in the distal quadriceps tendon region. No acute fracture. Small joint effusion. Soft tissues are unremarkable. IMPRESSION: 1. Stable 3 compartmental osteoarthritis and small reactive joint effusion. 2. Stable patella baja, with calcifications within the distal quadriceps tendon unchanged. Electronically Signed   By: Sharlet Salina M.D.   On: 10/08/2023 20:02   DG Knee 2 Views Left Result Date: 10/08/2023 CLINICAL DATA:  Pain EXAM: LEFT KNEE - 1-2 VIEW COMPARISON:  02/15/2022 FINDINGS: Frontal and  lateral views of the left knee are obtained. No fracture, subluxation, or dislocation. There is severe 3 compartmental osteoarthritis, most pronounced in the lateral compartment. Prominent enthesopathic changes along the upper pole of the patella. Small joint effusion. Soft tissues are unremarkable. IMPRESSION: 1. Severe 3 compartmental osteoarthritis and small reactive joint effusion. 2. No acute fracture. Electronically Signed   By: Sharlet Salina M.D.   On: 10/08/2023 20:01   DG Shoulder Left Result Date: 10/08/2023 CLINICAL DATA:  Shoulder pain EXAM: LEFT SHOULDER - 2+ VIEW COMPARISON:  02/15/2022 FINDINGS: Frontal and transscapular views of the left shoulder are obtained. No acute displaced fracture. Prominent spurring along the undersurface of the acromion process. Moderate acromioclavicular joint osteoarthritis. Glenohumeral joint space is well preserved. Soft tissues are unremarkable. Left chest is clear. IMPRESSION: 1. Stable acromioclavicular joint osteoarthritis and spurring of the acromion process. 2. No acute displaced fracture. Electronically Signed   By: Sharlet Salina M.D.   On: 10/08/2023 20:00   DG Chest 2 View Result Date: 10/08/2023 CLINICAL DATA:  Cough EXAM: CHEST - 2 VIEW COMPARISON:  08/30/2021 FINDINGS: Frontal and lateral views of the chest demonstrate a stable cardiac silhouette. Background emphysema and  parenchymal lung scarring again noted. There is increased density in the left suprahilar region which may reflect superimposed airspace disease. No effusion or pneumothorax. No acute bony abnormalities. IMPRESSION: 1. Increased density in the left suprahilar region, consistent with airspace disease superimposed upon chronic scarring and emphysema. Electronically Signed   By: Sharlet Salina M.D.   On: 10/08/2023 19:56    Procedures Procedures    Medications Ordered in ED Medications  sodium chloride 0.9 % bolus 1,000 mL (has no administration in time range)  lactated ringers bolus 500 mL (500 mLs Intravenous New Bag/Given 10/10/23 1431)  LORazepam (ATIVAN) injection 1 mg (1 mg Intravenous Given 10/10/23 1517)    ED Course/ Medical Decision Making/ A&P                                 Medical Decision Making Amount and/or Complexity of Data Reviewed Labs: ordered.  Risk Prescription drug management.   Denies history of prior A-fib, flutter, or SVT.  However, on review of records reveals that patient had atrial flutter in the area of 2023.  At that time he was admitted with atrial flutter and elevated troponin.  He had been found unresponsive and apneic with foaming at the mouth was responded to Narcan and had a seizure that lasted approximately 1 minute.  Was thought to be consistent with rhabdomyolysis and demand ischemia.  Echo at that time showed left ventricular ejection fracture of 60 to 65% with grade 1 diastolic dysfunction.  Patient currently denies cocaine for the past 2 years and states he has not had alcohol for 2 days. Differential diagnosis includes but is not limited to pneumonia, COPD exacerbation, atrial flutter, SVT, A-fib with RVR, anemia, alcohol withdrawal, other substance use/abuse, Patient denies any cocaine for the past 2 years.  His last alcohol was 2 days ago. He denies any fever, chills, recent productive cough. Review of records reveal previous episode of atrial  flutter. He is currently not anticoagulated.  EKG is significant for tachycardia with a rate around 170.  Difficult to tell what the underlying rhythm is.  May be SVT versus atrial fibrillation. Awaiting laboratory values to return.  Patient is given IV fluid bolus. Pending patient to have labs returned, patient is  signed out to [place].        Final Clinical Impression(s) / ED Diagnoses Final diagnoses:  Tachycardia    Rx / DC Orders ED Discharge Orders     None         Margarita Grizzle, MD 10/10/23 [phone]

## 2023-10-10 NOTE — ED Notes (Signed)
 Cardioversion at 120 joules by Dr. Karene Fry unsuccessful. Patient rolling on side and appears in pain and rigid with reaction to etomidate. Etomidate to be added to allergy list due to reaction. Repeat shock not attempted.

## 2023-10-11 ENCOUNTER — Other Ambulatory Visit: Payer: Self-pay | Admitting: Cardiology

## 2023-10-11 ENCOUNTER — Inpatient Hospital Stay (HOSPITAL_BASED_OUTPATIENT_CLINIC_OR_DEPARTMENT_OTHER): Payer: Medicare HMO

## 2023-10-11 DIAGNOSIS — D539 Nutritional anemia, unspecified: Secondary | ICD-10-CM | POA: Diagnosis present

## 2023-10-11 DIAGNOSIS — R0602 Shortness of breath: Secondary | ICD-10-CM

## 2023-10-11 DIAGNOSIS — R079 Chest pain, unspecified: Secondary | ICD-10-CM | POA: Diagnosis not present

## 2023-10-11 DIAGNOSIS — I5032 Chronic diastolic (congestive) heart failure: Secondary | ICD-10-CM | POA: Diagnosis present

## 2023-10-11 DIAGNOSIS — I4892 Unspecified atrial flutter: Secondary | ICD-10-CM | POA: Diagnosis not present

## 2023-10-11 DIAGNOSIS — I471 Supraventricular tachycardia, unspecified: Secondary | ICD-10-CM

## 2023-10-11 DIAGNOSIS — N179 Acute kidney failure, unspecified: Secondary | ICD-10-CM | POA: Diagnosis not present

## 2023-10-11 LAB — ECHOCARDIOGRAM COMPLETE
AR max vel: 2.99 cm2
AV Area VTI: 3.06 cm2
AV Area mean vel: 3.01 cm2
AV Mean grad: 4.5 mmHg
AV Peak grad: 7.7 mmHg
Ao pk vel: 1.39 m/s
Area-P 1/2: 4.21 cm2
Height: 69 in
S' Lateral: 2.9 cm
Weight: 3200 [oz_av]

## 2023-10-11 LAB — CBC WITH DIFFERENTIAL/PLATELET
Abs Immature Granulocytes: 0.01 10*3/uL (ref 0.00–0.07)
Basophils Absolute: 0 10*3/uL (ref 0.0–0.1)
Basophils Relative: 1 %
Eosinophils Absolute: 0.1 10*3/uL (ref 0.0–0.5)
Eosinophils Relative: 3 %
HCT: 34 % — ABNORMAL LOW (ref 39.0–52.0)
Hemoglobin: 10.9 g/dL — ABNORMAL LOW (ref 13.0–17.0)
Immature Granulocytes: 0 %
Lymphocytes Relative: 49 %
Lymphs Abs: 1.8 10*3/uL (ref 0.7–4.0)
MCH: 33.1 pg (ref 26.0–34.0)
MCHC: 32.1 g/dL (ref 30.0–36.0)
MCV: 103.3 fL — ABNORMAL HIGH (ref 80.0–100.0)
Monocytes Absolute: 0.3 10*3/uL (ref 0.1–1.0)
Monocytes Relative: 9 %
Neutro Abs: 1.4 10*3/uL — ABNORMAL LOW (ref 1.7–7.7)
Neutrophils Relative %: 38 %
Platelets: 140 10*3/uL — ABNORMAL LOW (ref 150–400)
RBC: 3.29 MIL/uL — ABNORMAL LOW (ref 4.22–5.81)
RDW: 16.5 % — ABNORMAL HIGH (ref 11.5–15.5)
WBC: 3.7 10*3/uL — ABNORMAL LOW (ref 4.0–10.5)
nRBC: 0 % (ref 0.0–0.2)

## 2023-10-11 LAB — COMPREHENSIVE METABOLIC PANEL
ALT: 11 U/L (ref 0–44)
AST: 20 U/L (ref 15–41)
Albumin: 2.6 g/dL — ABNORMAL LOW (ref 3.5–5.0)
Alkaline Phosphatase: 50 U/L (ref 38–126)
Anion gap: 5 (ref 5–15)
BUN: 14 mg/dL (ref 8–23)
CO2: 22 mmol/L (ref 22–32)
Calcium: 8.1 mg/dL — ABNORMAL LOW (ref 8.9–10.3)
Chloride: 108 mmol/L (ref 98–111)
Creatinine, Ser: 1.09 mg/dL (ref 0.61–1.24)
GFR, Estimated: >60 mL/min (ref >60)
Glucose, Bld: 139 mg/dL — ABNORMAL HIGH (ref 70–99)
Potassium: 4.8 mmol/L (ref 3.5–5.1)
Sodium: 135 mmol/L (ref 135–145)
Total Bilirubin: 0.5 mg/dL (ref 0.0–1.2)
Total Protein: 5.8 g/dL — ABNORMAL LOW (ref 6.5–8.1)

## 2023-10-11 LAB — CBG MONITORING, ED
Glucose-Capillary: 122 mg/dL — ABNORMAL HIGH (ref 70–99)
Glucose-Capillary: 92 mg/dL (ref 70–99)

## 2023-10-11 LAB — PROCALCITONIN: Procalcitonin: <0.1 ng/mL

## 2023-10-11 LAB — STREP PNEUMONIAE URINARY ANTIGEN: Strep Pneumo Urinary Antigen: NEGATIVE

## 2023-10-11 LAB — GLUCOSE, CAPILLARY
Glucose-Capillary: 115 mg/dL — ABNORMAL HIGH (ref 70–99)
Glucose-Capillary: 131 mg/dL — ABNORMAL HIGH (ref 70–99)

## 2023-10-11 LAB — MAGNESIUM: Magnesium: 2 mg/dL (ref 1.7–2.4)

## 2023-10-11 LAB — APTT: aPTT: 26 s (ref 24–36)

## 2023-10-11 LAB — TROPONIN I (HIGH SENSITIVITY)
Troponin I (High Sensitivity): 13 ng/L (ref <18)
Troponin I (High Sensitivity): 13 ng/L (ref <18)

## 2023-10-11 MED ORDER — DILTIAZEM HCL ER COATED BEADS 180 MG PO CP24
180.0000 mg | ORAL_CAPSULE | Freq: Every day | ORAL | Status: DC
  Administered 2023-10-11 – 2023-10-28 (×18): 180 mg via ORAL
  Filled 2023-10-11 (×18): qty 1

## 2023-10-11 MED ORDER — CYANOCOBALAMIN 1000 MCG/ML IJ SOLN
1000.0000 ug | Freq: Every day | INTRAMUSCULAR | Status: AC
Start: 2023-10-11 — End: 2023-10-13
  Administered 2023-10-11 – 2023-10-12 (×2): 1000 ug via INTRAMUSCULAR
  Filled 2023-10-11 (×2): qty 1

## 2023-10-11 MED ORDER — LEVALBUTEROL HCL 0.63 MG/3ML IN NEBU: 0.6300 mg | INHALATION_SOLUTION | Freq: Four times a day (QID) | RESPIRATORY_TRACT | Status: DC | PRN

## 2023-10-11 MED ORDER — OXYCODONE-ACETAMINOPHEN 5-325 MG PO TABS
1.0000 | ORAL_TABLET | ORAL | Status: DC | PRN
  Administered 2023-10-11 – 2023-10-19 (×28): 1 via ORAL
  Filled 2023-10-11 (×29): qty 1

## 2023-10-11 MED ORDER — VITAMIN B-12 1000 MCG PO TABS
1000.0000 ug | ORAL_TABLET | Freq: Every day | ORAL | Status: DC
  Administered 2023-10-13 – 2023-10-28 (×16): 1000 ug via ORAL
  Filled 2023-10-11 (×16): qty 1

## 2023-10-11 MED ORDER — ADULT MULTIVITAMIN W/MINERALS CH
1.0000 | ORAL_TABLET | Freq: Every day | ORAL | Status: DC
  Administered 2023-10-11 – 2023-10-28 (×18): 1 via ORAL
  Filled 2023-10-11 (×18): qty 1

## 2023-10-11 MED ORDER — THIAMINE MONONITRATE 100 MG PO TABS
100.0000 mg | ORAL_TABLET | Freq: Every day | ORAL | Status: DC
  Administered 2023-10-11 – 2023-10-28 (×18): 100 mg via ORAL
  Filled 2023-10-11 (×18): qty 1

## 2023-10-11 MED ORDER — FOLIC ACID 1 MG PO TABS
1.0000 mg | ORAL_TABLET | Freq: Every day | ORAL | Status: DC
  Administered 2023-10-11 – 2023-10-28 (×18): 1 mg via ORAL
  Filled 2023-10-11 (×18): qty 1

## 2023-10-11 MED ORDER — PANTOPRAZOLE SODIUM 40 MG PO TBEC
40.0000 mg | DELAYED_RELEASE_TABLET | Freq: Every day | ORAL | Status: DC
  Administered 2023-10-11 – 2023-10-28 (×18): 40 mg via ORAL
  Filled 2023-10-11 (×18): qty 1

## 2023-10-11 NOTE — Progress Notes (Signed)
 PROGRESS NOTE  [name]. GNF:[id number] DOB: 08-14-1956   PCP: Blima Singer, PA  Patient is from: Rooming home.  DOA: 10/10/2023 LOS: 1  Chief complaints Chief Complaint  Patient presents with   Tachycardia     Brief Narrative / Interim history: 68 year old M with PMH of diastolic CHF, DM-2, HTN, PE not on anticoagulation, recent pneumonia/bronchitis, anxiety, depression and osteoarthritis brought to ED by EMS from doctor's office due to SVT with HR to 170s on preoperative evaluation for knee replacement.  Patient was given 6 mg adenosine by EMS and converted to NSR with HR to 88.  However, heart rate elevated to 166 on arrival to ED. He remained in SVT despite additional 6 mg of adenosine and attempted electrocardioversion in ED. cardiology consulted.  He was started on Cardizem drip.  Echocardiogram ordered.   The next day, he converted to normal sinus rhythm.  Transition to p.o. Cardizem CD 180 mg.  He was cleared for discharge by cardiology with outpatient Zio patch and EP follow-up if no significant finding on echocardiogram.  However, patient is physically deconditioned partly due to his severe osteoarthritis.  Therapy recommended SNF.  Subjective: Seen and examined earlier this morning.  No major events overnight of this morning.  Reports severe bilateral knee pain, shoulder pain, dysphagia and progressive shortness of breath.  Has chest pain or palpitation.  Objective: Vitals:   10/11/23 0700 10/11/23 0730 10/11/23 0852 10/11/23 0955  BP: 111/79 104/67  97/70  Pulse: 79 78  78  Resp: 15 19  13   Temp: 98 F (36.7 C)   97.9 F (36.6 C)  TempSrc:      SpO2: 96% 91% 93% 94%  Weight:      Height:        Examination:  GENERAL: No apparent distress.  Nontoxic. HEENT: MMM.  Vision and hearing grossly intact.  NECK: Supple.  No apparent JVD.  RESP:  No IWOB.  Fair aeration bilaterally. CVS:  RRR. Heart sounds normal.  ABD/GI/GU: BS+. Abd soft, NTND.   MSK/EXT:  Moves extremities. No apparent deformity.  Symmetric weakness.  No edema. SKIN: no apparent skin lesion or wound NEURO: Awake, alert and oriented appropriately.  No apparent focal neuro deficit. PSYCH: Calm. Normal affect.   Procedures:  None  Microbiology summarized: None  Assessment and plan: Supraventricular tachycardia/history of paroxysmal atrial flutter: Unclear etiology of this.  Reports drinking alcohol.  Last drink the day prior to admission.  TSH normal.  Has history of paroxysmal atrial flutter.  Not on nodal blocking agent.  Not on anticoagulation either.  SVT was refractory to 2 doses of adenosine but resolved after IV Cardizem drip.  BNP was elevated to 812.  X-ray without vascular congestion or edema.  TSH is 1.22.  Currently in sinus rhythm with HR in 70s and 80s. -Appreciate recs by cardiology  -P.o. Cardizem CD 180 mg daily  -Discontinue home amlodipine  -2-week outpatient Zio patch  -EP follow-up in 2 weeks -Encourage alcohol cessation -Optimize electrolytes -Follow echocardiogram  Chronic diastolic CHF: Endorses shortness of breath but appears euvolemic on exam.  CXR did not suggest acute CHF.  BNP elevated to 812 which could be due to SVT.  TTE in 08/2021 with LVEF of 60 to 65%, mild LVH, G1DD.  -Follow echocardiogram  Community-acquired pneumonia/acute bronchitis: Recently started on doxycycline and prednisone for this.  CXR with chronic bronchitic changes and probable improvement of previous left lung infiltrate.  He is COVID-19, influenza and RSV PCR was  negative on 2/22 -Check a 20 pathogen RVP -Check procalcitonin -Continue ceftriaxone and doxycycline pending Pro-Cal and RVP.   Shortness of breath: Could be due to bronchitis versus CHF exacerbation.  Does not seem to be on breathing treatments at home. -Follow echocardiogram -Antibiotics as above -Xopenex as needed  AKI: Baseline Cr about 1.0.  Resolving. Recent Labs    04/20/23 1138  05/11/23 1702 08/08/23 1655 10/10/23 1428 10/11/23 0415  BUN 6* 20 15 15 14   CREATININE 1.15 1.09 0.97 1.32* 1.09  -Avoid nephrotoxic meds -Monitor  Macrocytic anemia/vitamin B12 deficiency: Noted about 3 g drop in Hgb.  Patient denies melena or hematochezia.  He is not on anticoagulation or antiplatelet but seems to take naproxen.  Received about 1.2 L fluid in ED yesterday.  B12 low at 149.  Likely from alcohol use. Recent Labs    04/20/23 1138 05/11/23 1702 08/08/23 1655 10/10/23 1428 10/11/23 0415  HGB 15.2 14.9 16.0 12.5* 10.9*  -Continue monitoring.  Will check Hemoccult if hemoglobin drops further -B12 injection 1000 mcg x 2 followed by p.o. -Start p.o. Protonix -SCD for VTE prophylaxis until H&H is stable   Essential hypertension: Normotensive -P.o. Cardizem as above  NIDDM-2 with hyperglycemia: A1c 5.8%.  Was 8.8% in 2019.  Not on meds at home. Recent Labs  Lab 10/10/23 2343 10/11/23 0848  GLUCAP 149* 122*  -Continue SSI-very sensitive  Chronic pain/osteoarthritis/ambulatory dysfunction-followed by orthopedic surgery with planned knee replacement?  Previously on opiates but has not filled any opiate since 07/06/2023 per narcotic database.  Started on Percocet this admission.  -Continue Tylenol and oxycodone as needed. -PT/OT-recommended SNF.  Dysphagia-reports oropharyngeal dysphagia -SLP eval  Tobacco use disorder-reports smoking cigarette but does not want to quantify.  Not interested in nicotine patch due to -Encouraged smoking cessation  Alcohol abuse: Reports drinking about a quart.  Last drink the day before admission.  Denies major withdrawal symptoms. -Counseled. -Multivitamin, thiamine and folic acid -Consult TOC  Body mass index is 29.53 kg/m.           DVT prophylaxis:  SCDs Start: 10/10/23 2001  Code Status: Full code Family Communication: None at bedside Level of care: Telemetry Medical Status is: Inpatient Remains inpatient  appropriate because: SVT, pneumonia and ambulatory dysfunction   Final disposition: SNF Consultants:  Cardiology  55 minutes with more than 50% spent in reviewing records, counseling patient/family and coordinating care.   Sch Meds:  Scheduled Meds:  cyanocobalamin  1,000 mcg Intramuscular Daily   Followed by   Melene Muller ON 10/13/2023] vitamin B-12  1,000 mcg Oral Daily   diltiazem  180 mg Oral Daily   folic acid  1 mg Oral Daily   insulin aspart  0-6 Units Subcutaneous TID WC   multivitamin with minerals  1 tablet Oral Daily   pantoprazole  40 mg Oral Daily   thiamine  100 mg Oral Daily   Continuous Infusions:  cefTRIAXone (ROCEPHIN)  IV Stopped (10/10/23 2154)   diltiazem (CARDIZEM) infusion 5 mg/hr (10/11/23 0402)   doxycycline (VIBRAMYCIN) IV 100 mg (10/11/23 0948)   PRN Meds:.acetaminophen **OR** acetaminophen, benzonatate, levalbuterol, melatonin, ondansetron (ZOFRAN) IV, oxyCODONE-acetaminophen  Antimicrobials: Anti-infectives (From admission, onward)    Start     Dose/Rate Route Frequency Ordered Stop   10/10/23 2100  doxycycline (VIBRAMYCIN) 100 mg in sodium chloride 0.9 % 250 mL IVPB        100 mg 125 mL/hr over 120 Minutes Intravenous Every 12 hours 10/10/23 2045     10/10/23 2100  cefTRIAXone (ROCEPHIN) 1 g in sodium chloride 0.9 % 100 mL IVPB        1 g 200 mL/hr over 30 Minutes Intravenous Every 24 hours 10/10/23 2045          I have personally reviewed the following labs and images: CBC: Recent Labs  Lab 10/10/23 1428 10/11/23 0415  WBC 5.3 3.7*  NEUTROABS  --  1.4*  HGB 12.5* 10.9*  HCT 38.9* 34.0*  MCV 101.8* 103.3*  PLT 188 140*   BMP &GFR Recent Labs  Lab 10/10/23 1425 10/10/23 1428 10/11/23 0415  NA  --  139 135  K  --  4.3 4.8  CL  --  109 108  CO2  --  19* 22  GLUCOSE  --  93 139*  BUN  --  15 14  CREATININE  --  1.32* 1.09  CALCIUM  --  8.5* 8.1*  MG 1.6*  --  2.0   Estimated Creatinine Clearance: 73.2 mL/min (by C-G formula  based on SCr of 1.09 mg/dL). Liver & Pancreas: Recent Labs  Lab 10/10/23 1428 10/11/23 0415  AST 20 20  ALT 14 11  ALKPHOS 59 50  BILITOT 0.6 0.5  PROT 6.7 5.8*  ALBUMIN 2.9* 2.6*   No results for input(s): "LIPASE", "AMYLASE" in the last 168 hours. No results for input(s): "AMMONIA" in the last 168 hours. Diabetic: No results for input(s): "HGBA1C" in the last 72 hours. Recent Labs  Lab 10/10/23 2343 10/11/23 0848  GLUCAP 149* 122*   Cardiac Enzymes: No results for input(s): "CKTOTAL", "CKMB", "CKMBINDEX", "TROPONINI" in the last 168 hours. No results for input(s): "PROBNP" in the last 8760 hours. Coagulation Profile: Recent Labs  Lab 10/10/23 1425  INR 1.1   Thyroid Function Tests: Recent Labs    10/10/23 1952  TSH 1.226   Lipid Profile: No results for input(s): "CHOL", "HDL", "LDLCALC", "TRIG", "CHOLHDL", "LDLDIRECT" in the last 72 hours. Anemia Panel: Recent Labs    10/10/23 1425 10/10/23 2049  VITAMINB12  --  149*  FOLATE  --  5.9*  FERRITIN 92  --   TIBC 273  --   IRON 93  --    Urine analysis:    Component Value Date/Time   COLORURINE YELLOW 10/10/2023 2048   APPEARANCEUR HAZY (A) 10/10/2023 2048   LABSPEC 1.017 10/10/2023 2048   PHURINE 5.0 10/10/2023 2048   GLUCOSEU NEGATIVE 10/10/2023 2048   HGBUR NEGATIVE 10/10/2023 2048   BILIRUBINUR NEGATIVE 10/10/2023 2048   KETONESUR NEGATIVE 10/10/2023 2048   PROTEINUR NEGATIVE 10/10/2023 2048   UROBILINOGEN 0.2 12/24/2009 1944   NITRITE NEGATIVE 10/10/2023 2048   LEUKOCYTESUR NEGATIVE 10/10/2023 2048   Sepsis Labs: Invalid input(s): "PROCALCITONIN", "LACTICIDVEN"  Microbiology: Recent Results (from the past 240 hours)  Resp panel by RT-PCR (RSV, Flu A&B, Covid) Anterior Nasal Swab     Status: None   Collection Time: 10/08/23  6:20 PM   Specimen: Anterior Nasal Swab  Result Value Ref Range Status   SARS Coronavirus 2 by RT PCR NEGATIVE NEGATIVE Final    Comment: (NOTE) SARS-CoV-2 target  nucleic acids are NOT DETECTED.  The SARS-CoV-2 RNA is generally detectable in upper respiratory specimens during the acute phase of infection. The lowest concentration of SARS-CoV-2 viral copies this assay can detect is 138 copies/mL. A negative result does not preclude SARS-Cov-2 infection and should not be used as the sole basis for treatment or other patient management decisions. A negative result may occur with  improper specimen collection/handling,  submission of specimen other than nasopharyngeal swab, presence of viral mutation(s) within the areas targeted by this assay, and inadequate number of viral copies(<138 copies/mL). A negative result must be combined with clinical observations, patient history, and epidemiological information. The expected result is Negative.  Fact Sheet for Patients:  BloggerCourse.com  Fact Sheet for Healthcare Providers:  SeriousBroker.it  This test is no t yet approved or cleared by the [place] FDA and  has been authorized for detection and/or diagnosis of SARS-CoV-2 by FDA under an Emergency Use Authorization (EUA). This EUA will remain  in effect (meaning this test can be used) for the duration of the COVID-19 declaration under Section 564(b)(1) of the Act, 21 U.S.C.section 360bbb-3(b)(1), unless the authorization is terminated  or revoked sooner.       Influenza A by PCR NEGATIVE NEGATIVE Final   Influenza B by PCR NEGATIVE NEGATIVE Final    Comment: (NOTE) The Xpert Xpress SARS-CoV-2/FLU/RSV plus assay is intended as an aid in the diagnosis of influenza from Nasopharyngeal swab specimens and should not be used as a sole basis for treatment. Nasal washings and aspirates are unacceptable for Xpert Xpress SARS-CoV-2/FLU/RSV testing.  Fact Sheet for Patients: BloggerCourse.com  Fact Sheet for Healthcare  Providers: SeriousBroker.it  This test is not yet approved or cleared by the [place] FDA and has been authorized for detection and/or diagnosis of SARS-CoV-2 by FDA under an Emergency Use Authorization (EUA). This EUA will remain in effect (meaning this test can be used) for the duration of the COVID-19 declaration under Section 564(b)(1) of the Act, 21 U.S.C. section 360bbb-3(b)(1), unless the authorization is terminated or revoked.     Resp Syncytial Virus by PCR NEGATIVE NEGATIVE Final    Comment: (NOTE) Fact Sheet for Patients: BloggerCourse.com  Fact Sheet for Healthcare Providers: SeriousBroker.it  This test is not yet approved or cleared by the [place] FDA and has been authorized for detection and/or diagnosis of SARS-CoV-2 by FDA under an Emergency Use Authorization (EUA). This EUA will remain in effect (meaning this test can be used) for the duration of the COVID-19 declaration under Section 564(b)(1) of the Act, 21 U.S.C. section 360bbb-3(b)(1), unless the authorization is terminated or revoked.  Performed at [facility], [address] [place], [place] [address]     Radiology Studies: ECHOCARDIOGRAM COMPLETE Result Date: 10/11/2023    ECHOCARDIOGRAM REPORT   Patient Name:   Todd [name]. Date of Exam: 10/11/2023 Medical Rec #:  [id number]          Height:       69.0 in Accession #:    [id number]         Weight:       200.0 lb Date of Birth:  [birth year]          BSA:          2.066 m Patient Age:    72 years           BP:           110/80 mmHg Patient Gender: M                  HR:           78 bpm. Exam Location:  Inpatient Procedure: 2D Echo (Both Spectral and Color Flow Doppler were utilized during            procedure). Indications:    SVT (supraventricular tachycardia) (HCC) [[id number]]  History:  Patient has prior history of Echocardiogram examinations,  most                 recent 08/31/2021. ETOH, Cocaine and opiate abuse,                 Arrythmias:RBBB and Atrial Flutter; Risk Factors:Hypertension                 and Current Smoker.  Sonographer:    Dondra Prader RVT RCS Referring Phys: [id number] Lynda Rainwater A ACHARYA IMPRESSIONS  1. Left ventricular ejection fraction, by estimation, is 55 to 60%. The left ventricle has normal function. The left ventricle has no regional wall motion abnormalities. Left ventricular diastolic parameters were normal.  2. Right ventricular systolic function is normal. The right ventricular size is normal. There is mildly elevated pulmonary artery systolic pressure.  3. Left atrial size was mildly dilated.  4. The mitral valve is normal in structure. Trivial mitral valve regurgitation. No evidence of mitral stenosis.  5. The aortic valve is tricuspid. Aortic valve regurgitation is not visualized. No aortic stenosis is present.  6. The inferior vena cava is dilated in size with <50% respiratory variability, suggesting right atrial pressure of 15 mmHg. FINDINGS  Left Ventricle: Left ventricular ejection fraction, by estimation, is 55 to 60%. The left ventricle has normal function. The left ventricle has no regional wall motion abnormalities. Strain imaging was not performed. The left ventricular internal cavity  size was normal in size. There is no left ventricular hypertrophy. Left ventricular diastolic parameters were normal. Right Ventricle: The right ventricular size is normal. No increase in right ventricular wall thickness. Right ventricular systolic function is normal. There is mildly elevated pulmonary artery systolic pressure. The tricuspid regurgitant velocity is 2.66  m/s, and with an assumed right atrial pressure of 15 mmHg, the estimated right ventricular systolic pressure is 43.3 mmHg. Left Atrium: Left atrial size was mildly dilated. Right Atrium: Right atrial size was normal in size. Pericardium: There is no evidence of  pericardial effusion. Mitral Valve: The mitral valve is normal in structure. Trivial mitral valve regurgitation. No evidence of mitral valve stenosis. Tricuspid Valve: The tricuspid valve is normal in structure. Tricuspid valve regurgitation is mild . No evidence of tricuspid stenosis. Aortic Valve: The aortic valve is tricuspid. Aortic valve regurgitation is not visualized. No aortic stenosis is present. Aortic valve mean gradient measures 4.5 mmHg. Aortic valve peak gradient measures 7.7 mmHg. Aortic valve area, by VTI measures 3.06 cm. Pulmonic Valve: The pulmonic valve was normal in structure. Pulmonic valve regurgitation is trivial. No evidence of pulmonic stenosis. Aorta: The aortic root is normal in size and structure. Venous: The inferior vena cava is dilated in size with less than 50% respiratory variability, suggesting right atrial pressure of 15 mmHg. IAS/Shunts: No atrial level shunt detected by color flow Doppler. Additional Comments: 3D imaging was not performed.  LEFT VENTRICLE PLAX 2D LVIDd:         4.90 cm   Diastology LVIDs:         2.90 cm   LV e' medial:    6.96 cm/s LV PW:         1.20 cm   LV E/e' medial:  11.8 LV IVS:        1.00 cm   LV e' lateral:   11.00 cm/s LVOT diam:     2.10 cm   LV E/e' lateral: 7.5 LV SV:         80 LV  SV Index:   39 LVOT Area:     3.46 cm  RIGHT VENTRICLE             IVC RV Basal diam:  3.10 cm     IVC diam: 2.40 cm RV Mid diam:    2.30 cm RV S prime:     10.40 cm/s TAPSE (M-mode): 2.4 cm LEFT ATRIUM             Index        RIGHT ATRIUM           Index LA diam:        4.50 cm 2.18 cm/m   RA Area:     17.00 cm LA Vol (A2C):   64.9 ml 31.41 ml/m  RA Volume:   41.80 ml  20.23 ml/m LA Vol (A4C):   69.2 ml 33.49 ml/m LA Biplane Vol: 75.4 ml 36.50 ml/m  AORTIC VALVE                    PULMONIC VALVE AV Area (Vmax):    2.99 cm     PV Vmax:       0.68 m/s AV Area (Vmean):   3.01 cm     PV Peak grad:  1.8 mmHg AV Area (VTI):     3.06 cm AV Vmax:           139.00  cm/s AV Vmean:          93.850 cm/s AV VTI:            0.263 m AV Peak Grad:      7.7 mmHg AV Mean Grad:      4.5 mmHg LVOT Vmax:         120.00 cm/s LVOT Vmean:        81.500 cm/s LVOT VTI:          0.232 m LVOT/AV VTI ratio: 0.88  AORTA Ao Root diam: 3.40 cm Ao Asc diam:  3.60 cm MITRAL VALVE               TRICUSPID VALVE MV Area (PHT): 4.21 cm    TR Peak grad:   28.3 mmHg MV Decel Time: 180 msec    TR Vmax:        266.00 cm/s MV E velocity: 82.10 cm/s MV A velocity: 67.40 cm/s  SHUNTS MV E/A ratio:  1.22        Systemic VTI:  0.23 m                            Systemic Diam: 2.10 cm Clearnce Hasten Electronically signed by Clearnce Hasten Signature Date/Time: 10/11/2023/11:49:31 AM    Final    DG Chest Port 1 View Result Date: 10/10/2023 CLINICAL DATA:  Supraventricular tachycardia. History of DVT and pulmonary embolus. EXAM: PORTABLE CHEST 1 VIEW COMPARISON:  10/08/2023 FINDINGS: Mild cardiac enlargement. Peribronchial thickening with central interstitial changes consistent with chronic bronchitis. Unfortunately a patches placed over the left mid and lower lung zone which limits evaluation but superimposed infiltrates seen on prior study on the left appear improved. No pleural effusion or pneumothorax. Mediastinal contours appear intact. IMPRESSION: Chronic bronchitic changes in the lungs. Probable improvement of previous left lung infiltrates. Electronically Signed   By: Burman Nieves M.D.   On: 10/10/2023 22:30      Weda Baumgarner T. Blandon Offerdahl [facility]  If 7PM-7AM, please contact night-coverage [email or web address] 10/11/2023, 11:59 AM

## 2023-10-11 NOTE — Evaluation (Signed)
 Occupational Therapy Evaluation Patient Details Name: Todd [name]. MRN: [id number] DOB: 1956/05/13 Today's Date: 10/11/2023   History of Present Illness   68 y.o. male presents to [facility] hospital on 10/10/2023 with SVT. Pt was recently diagnosed with CAP on 10/08/23. PMH includes PAF, HTN, chronic diastolic HF.     Clinical Impressions Patient admitted for the diagnosis above.  PTA he lives at a boarding home, has 4 STE with no rails.  Patient describes decreasing mobility and functional status.  Currently he is needing up to Min A for bed mobility, but was unable to stand bedside with OT due to L knee pain.  In addition, he is needing Max A for lower body ADL from a seated position.  Patient has no family support, and no one at the boarding home will assist him.  Patient will benefit from continued inpatient follow up therapy, <3 hours/day prior to transitioning home.  He does not have the needed assist to transition directly home without near 24 hour support.  OT will follow in the acute setting to address deficits.       If plan is discharge home, recommend the following:   A lot of help with walking and/or transfers;A lot of help with bathing/dressing/bathroom;Assist for transportation;Assistance with cooking/housework;Help with stairs or ramp for entrance     Functional Status Assessment   Patient has had a recent decline in their functional status and demonstrates the ability to make significant improvements in function in a reasonable and predictable amount of time.     Equipment Recommendations   Tub/shower bench     Recommendations for Other Services         Precautions/Restrictions   Precautions Precautions: Fall Restrictions Weight Bearing Restrictions Per Provider Order: No     Mobility Bed Mobility Overal bed mobility: Needs Assistance Bed Mobility: Supine to Sit, Sit to Supine     Supine to sit: Min assist Sit to supine: Min assist   General bed  mobility comments: mild support for truck and L knee/leg    Transfers                   General transfer comment: Patient unable to stand on L knee due to pain.      Balance Overall balance assessment: Needs assistance Sitting-balance support: Feet unsupported, Bilateral upper extremity supported Sitting balance-Leahy Scale: Fair Sitting balance - Comments: difficulty leaning side to side.                                   ADL either performed or assessed with clinical judgement   ADL Overall ADL's : Needs assistance/impaired Eating/Feeding: Set up;Sitting   Grooming: Wash/dry hands;Wash/dry face;Set up;Sitting   Upper Body Bathing: Moderate assistance;Sitting   Lower Body Bathing: Maximal assistance;Sitting/lateral leans   Upper Body Dressing : Moderate assistance;Sitting   Lower Body Dressing: Maximal assistance;Sitting/lateral leans   Toilet Transfer: Moderate assistance;Squat-pivot                   Vision Patient Visual Report: No change from baseline       Perception Perception: Within Functional Limits       Praxis Praxis: WFL       Pertinent Vitals/Pain Pain Assessment Pain Assessment: Faces Faces Pain Scale: Hurts whole lot Pain Location: L knee Pain Descriptors / Indicators: Grimacing, Sharp Pain Intervention(s): Limited activity within patient's tolerance     Extremity/Trunk  Assessment Upper Extremity Assessment Upper Extremity Assessment: Right hand dominant;RUE deficits/detail;LUE deficits/detail RUE Deficits / Details: decreased end range to shoulder flexion RUE: Shoulder pain with ROM RUE Sensation: WNL RUE Coordination: WNL LUE Deficits / Details: decreased end range to shoulder flexion LUE: Shoulder pain with ROM LUE Sensation: WNL LUE Coordination: WNL   Lower Extremity Assessment Lower Extremity Assessment: Defer to PT evaluation   Cervical / Trunk Assessment Cervical / Trunk Assessment: Kyphotic    Communication Communication Communication: No apparent difficulties   Cognition Arousal: Alert Behavior During Therapy: WFL for tasks assessed/performed Cognition: No apparent impairments                               Following commands: Intact       Cueing  General Comments   Cueing Techniques: Verbal cues   VSS on RA, HR in the 80's throughout.     Exercises     Shoulder Instructions      Home Living Family/patient expects to be discharged to:: Private residence Living Arrangements: Group Home Available Help at Discharge: Family;Available PRN/intermittently Type of Home: Other(Comment) (Boarding home with individual apartments/rooms and shared bathroom.) Home Access: Stairs to enter [employer] of Steps: 4 Entrance Stairs-Rails: None Home Layout: One level     Bathroom Shower/Tub: Chief Strategy Officer: Standard Bathroom Accessibility: Yes How Accessible: Accessible via walker Home Equipment: Rollator (4 wheels);Grab bars - tub/shower          Prior Functioning/Environment Prior Level of Function : Needs assist               ADLs Comments: Patient states having to pay other residents for meals, uses microwave.  Able to bathe and dress himself at baseline, but takes about an hour.  Orders groceries for delivery.    OT Problem List: Decreased strength;Decreased range of motion;Decreased activity tolerance;Impaired balance (sitting and/or standing);Pain   OT Treatment/Interventions: Self-care/ADL training;Therapeutic activities;Therapeutic exercise;Patient/family education;DME and/or AE instruction;Balance training      OT Goals(Current goals can be found in the care plan section)   Acute Rehab OT Goals Patient Stated Goal: Increse strength and mobility OT Goal Formulation: With patient Time For Goal Achievement: 10/25/23 Potential to Achieve Goals: Good ADL Goals Pt Will Perform Upper Body Bathing: with  supervision;sitting Pt Will Perform Lower Body Bathing: with min assist;sit to/from stand Pt Will Perform Upper Body Dressing: with supervision;sitting Pt Will Perform Lower Body Dressing: with min assist;sit to/from stand;with adaptive equipment Pt Will Transfer to Toilet: with supervision;stand pivot transfer;bedside commode   OT Frequency:  Min 1X/week    Co-evaluation              AM-PAC OT "6 Clicks" Daily Activity     Outcome Measure Help from another person eating meals?: None Help from another person taking care of personal grooming?: A Little Help from another person toileting, which includes using toliet, bedpan, or urinal?: A Lot Help from another person bathing (including washing, rinsing, drying)?: A Lot Help from another person to put on and taking off regular upper body clothing?: A Lot Help from another person to put on and taking off regular lower body clothing?: A Lot 6 Click Score: 15   End of Session Nurse Communication: Mobility status  Activity Tolerance: Patient limited by pain Patient left: in bed;with call bell/phone within reach;with family/visitor present  OT Visit Diagnosis: Unsteadiness on feet (R26.81);Other abnormalities of gait and mobility (  R26.89);Muscle weakness (generalized) (M62.81);Pain Pain - Right/Left: Left Pain - part of body: Knee                Time: 1010-1029 OT Time Calculation (min): 19 min Charges:  OT General Charges $OT Visit: 1 Visit OT Evaluation $OT Eval Moderate Complexity: 1 Mod  10/11/2023  RP, OTR/L  Acute Rehabilitation Services  Office:  [phone]   Suzanna Obey 10/11/2023, 10:59 AM

## 2023-10-11 NOTE — Progress Notes (Signed)
*  PRELIMINARY RESULTS* Echocardiogram 2D Echocardiogram has been performed.  Laddie Aquas 10/11/2023, 11:38 AM

## 2023-10-11 NOTE — Evaluation (Signed)
 Clinical/Bedside Swallow Evaluation Patient Details  Name: Todd [name]. MRN: [id number] Date of Birth: [birth year]  Today's Date: 10/11/2023 Time: SLP Start Time (ACUTE ONLY): 1305 SLP Stop Time (ACUTE ONLY): 1320 SLP Time Calculation (min) (ACUTE ONLY): 15 min  Past Medical History:  Past Medical History:  Diagnosis Date   Arthritis    "fingers" (07/14/2018)   Depression    DVT (deep venous thrombosis) (HCC) LLE   Hepatitis C     finished harvoni tx ~ 2017   Hypercholesterolemia    Hypertension    Prostate cancer (HCC) 59yrs ago   Pulmonary embolism and infarction (HCC) 07/13/2018   Sleep apnea    not currently using cpap, mask causing vertigo   Type II diabetes mellitus (HCC)    Past Surgical History:  Past Surgical History:  Procedure Laterality Date   BIOPSY  01/12/2019   Procedure: BIOPSY;  Surgeon: Bernette Redbird, MD;  Location: [facility] ENDOSCOPY;  Service: Endoscopy;;   BIOPSY  06/23/2019   Procedure: BIOPSY;  Surgeon: Willis Modena, MD;  Location: [facility] ENDOSCOPY;  Service: Endoscopy;;   COLONOSCOPY WITH PROPOFOL N/A 02/19/2014   Procedure: COLONOSCOPY WITH PROPOFOL;  Surgeon: Charolett Bumpers, MD;  Location: [facility] ENDOSCOPY;  Service: Endoscopy;  Laterality: N/A;   ENDOVENOUS ABLATION SAPHENOUS VEIN W/ LASER Left 11/22/2017   endovenous laser ablation L SSV by Josephina Gip MD    ESOPHAGEAL BRUSHING  06/23/2019   Procedure: ESOPHAGEAL BRUSHING;  Surgeon: Willis Modena, MD;  Location: [facility] ENDOSCOPY;  Service: Endoscopy;;   ESOPHAGOGASTRODUODENOSCOPY (EGD) WITH PROPOFOL N/A 01/12/2019   Procedure: ESOPHAGOGASTRODUODENOSCOPY (EGD) WITH PROPOFOL;  Surgeon: Bernette Redbird, MD;  Location: [facility] ENDOSCOPY;  Service: Endoscopy;  Laterality: N/A;  Patient is also scheduled for barium swallow; please notify radiology after patient's EGD is complete so that barium swallow follows the endoscopy, not vice versa   ESOPHAGOGASTRODUODENOSCOPY (EGD) WITH PROPOFOL N/A 06/23/2019   Procedure:  ESOPHAGOGASTRODUODENOSCOPY (EGD) WITH PROPOFOL;  Surgeon: Willis Modena, MD;  Location: [facility] ENDOSCOPY;  Service: Endoscopy;  Laterality: N/A;   PROSTATECTOMY  2008   REPAIR QUADRICEPS / HAMSTRING MUSCLE Right    HPI:  Todd [name] is a 68 yo male presenting to ED 2/24 for further evaluation of SVT with elevated heart rate per EKG at OP orthopedic surgery office. Recently seen 2/22 with cough and was diagnosed with CAP. Seen by SLP 09/05/20 with history of esophageal difficulty and was recommended a Dys 3 diet with thin liquids. PMH includes paroxysmal A-fib, essential HTN, chronic diastolic HF (LVEF 60-65% 08/31/21), prostate cancer    Assessment / Plan / Recommendation  Clinical Impression  Pt reports difficulty swallowing large pieces of meat and pills characterized by a globus sensation. He states he wears upper dentures but does not consistently wearing them for PO intake, which leads to increased difficulty with mastication. Pt had no signs clinically concerning for aspiration with thin liquids. He reported a globus sensation and had delayed throat clearance with solids. The symptoms that pt describes are suspected to be more consistent with an esophageal component. Discussed with MD, who ordered an esophagram given his history of esophageal dilation (2020). Pt stated he prefers his meals come in smaller pieces so will downgrade diet to Dys 3 and continue thin liquids. SLP to s/o at this time. SLP Visit Diagnosis: Dysphagia, unspecified (R13.10)    Aspiration Risk  Mild aspiration risk    Diet Recommendation Dysphagia 3 (Mech soft);Thin liquid    Liquid Administration via: Cup;Straw Medication Administration: Whole meds with  puree Supervision: Patient able to self feed Compensations: Slow rate;Small sips/bites Postural Changes: Seated upright at 90 degrees;Remain upright for at least 30 minutes after po intake    Other  Recommendations Recommended Consults: Consider esophageal  assessment Oral Care Recommendations: Oral care BID    Recommendations for follow up therapy are one component of a multi-disciplinary discharge planning process, led by the attending physician.  Recommendations may be updated based on patient status, additional functional criteria and insurance authorization.  Follow up Recommendations No SLP follow up      Assistance Recommended at Discharge    Functional Status Assessment Patient has not had a recent decline in their functional status  Frequency and Duration            Prognosis Prognosis for improved oropharyngeal function: Good      Swallow Study   General HPI: Todd [name] is a 68 yo male presenting to ED 2/24 for further evaluation of SVT with elevated heart rate per EKG at OP orthopedic surgery office. Recently seen 2/22 with cough and was diagnosed with CAP. Seen by SLP 09/05/20 with history of esophageal difficulty and was recommended a Dys 3 diet with thin liquids. PMH includes paroxysmal A-fib, essential HTN, chronic diastolic HF (LVEF 60-65% 08/31/21), prostate cancer Type of Study: Bedside Swallow Evaluation Previous Swallow Assessment: see HPI Diet Prior to this Study: Regular;Thin liquids (Level 0) Temperature Spikes Noted: No Respiratory Status: Room air History of Recent Intubation: No Behavior/Cognition: Alert;Cooperative;Pleasant mood Oral Cavity Assessment: Within Functional Limits Oral Care Completed by SLP: No Oral Cavity - Dentition: Dentures, top;Dentures, not available;Missing dentition Vision: Functional for self-feeding Self-Feeding Abilities: Able to feed self Patient Positioning: Upright in bed Baseline Vocal Quality: Hoarse Volitional Cough: Strong Volitional Swallow: Able to elicit    Oral/Motor/Sensory Function Overall Oral Motor/Sensory Function: Within functional limits   Ice Chips Ice chips: Not tested   Thin Liquid Thin Liquid: Within functional limits Presentation: Straw;Self Fed     Nectar Thick Nectar Thick Liquid: Not tested   Honey Thick Honey Thick Liquid: Not tested   Puree Puree: Not tested   Solid     Solid: Impaired Presentation: Self Fed Pharyngeal Phase Impairments: Throat Clearing - Delayed      Gwynneth Aliment, M.A., CF-SLP Speech Language Pathology, Acute Rehabilitation Services  Secure Chat preferred [phone]  10/11/2023,2:04 PM

## 2023-10-11 NOTE — Plan of Care (Signed)
  Problem: Education: Goal: Ability to describe self-care measures that may prevent or decrease complications (Diabetes Survival Skills Education) will improve Outcome: Progressing   Problem: Coping: Goal: Ability to adjust to condition or change in health will improve Outcome: Progressing   Problem: Nutritional: Goal: Maintenance of adequate nutrition will improve Outcome: Progressing   Problem: Health Behavior/Discharge Planning: Goal: Ability to manage health-related needs will improve Outcome: Progressing   Problem: Pain Managment: Goal: General experience of comfort will improve and/or be controlled Outcome: Progressing

## 2023-10-11 NOTE — Progress Notes (Addendum)
 Progress Note  Patient Name: Todd [name]. Date of Encounter: 10/11/2023  [facility] Cardiologist: Nicki Guadalajara, MD   Patient Profile     Subjective   Admitted to the ER with SVT and given adenosine which briefly slowed the rhythm but reoccurred and cardioverted to sinus rhythm with 120 J shock.  He remained in sinus for 30 seconds then went back into SVT but then converted on IV Cardizem to sinus rhythm.  UDS negative.  He has been normal sinus rhythm since converting last night  This morning complains of chest process chest along with shortness of breath.  This has been going on for several months.  His chest pain is mainly exertional and occurs when he is walking after his grandkids or any kind of exertional activity and gets short of breath with pain across his chest that resolves with rest.  Inpatient Medications    Scheduled Meds:  cyanocobalamin  1,000 mcg Intramuscular Daily   Followed by   Melene Muller ON 10/13/2023] vitamin B-12  1,000 mcg Oral Daily   insulin aspart  0-6 Units Subcutaneous TID WC   Continuous Infusions:  cefTRIAXone (ROCEPHIN)  IV Stopped (10/10/23 2154)   diltiazem (CARDIZEM) infusion 5 mg/hr (10/11/23 0402)   doxycycline (VIBRAMYCIN) IV 100 mg (10/11/23 0948)   PRN Meds: acetaminophen **OR** acetaminophen, benzonatate, melatonin, ondansetron (ZOFRAN) IV, oxyCODONE-acetaminophen   Vital Signs    Vitals:   10/11/23 0700 10/11/23 0730 10/11/23 0852 10/11/23 0955  BP: 111/79 104/67  97/70  Pulse: 79 78  78  Resp: 15 19  13   Temp: 98 F (36.7 C)   97.9 F (36.6 C)  TempSrc:      SpO2: 96% 91% 93% 94%  Weight:      Height:        Intake/Output Summary (Last 24 hours) at 10/11/2023 1055 Last data filed at 10/11/2023 0402 Gross per 24 hour  Intake 1948.92 ml  Output --  Net 1948.92 ml      10/10/2023    1:59 PM 08/08/2023    4:28 PM 05/11/2023    4:05 PM  Last 3 Weights  Weight (lbs) 200 lb 208 lb 203 lb 14.8 oz  Weight (kg)  90.719 kg 94.348 kg 92.5 kg      Telemetry    Normal sinus rhythm- Personally Reviewed  ECG    EKG 10/10/23 at 13:24:48 showed SVT at 167 bpm with right bundle 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   Neck: No JVD Cardiac: RRR, no murmurs, rubs, or gallops.  Respiratory: Diffuse rhonchi and wheezes GI: Soft, nontender, non-distended  MS: No edema; No deformity. Neuro:  Nonfocal  Psych: Normal affect   Labs    High Sensitivity Troponin:  No results for input(s): "TROPONINIHS" in the last 720 hours.    Chemistry Recent Labs  Lab 10/10/23 1428 10/11/23 0415  NA 139 135  K 4.3 4.8  CL 109 108  CO2 19* 22  GLUCOSE 93 139*  BUN 15 14  CREATININE 1.32* 1.09  CALCIUM 8.5* 8.1*  PROT 6.7 5.8*  ALBUMIN 2.9* 2.6*  AST 20 20  ALT 14 11  ALKPHOS 59 50  BILITOT 0.6 0.5  GFRNONAA 59* >60  ANIONGAP 11 5     Hematology Recent Labs  Lab 10/10/23 1428 10/11/23 0415  WBC 5.3 3.7*  RBC 3.82* 3.29*  HGB 12.5* 10.9*  HCT 38.9* 34.0*  MCV 101.8* 103.3*  MCH 32.7 33.1  MCHC 32.1 32.1  RDW 16.7* 16.5*  PLT 188 140*    BNP Recent Labs  Lab 10/10/23 1425  BNP 812.3*     DDimer  Recent Labs  Lab 10/10/23 1428  DDIMER 0.51*     CHA2DS2-VASc Score = 3   This indicates a 3.2% annual risk of stroke. The patient's score is based upon: CHF History: 0 HTN History: 1 Diabetes History: 1 Stroke History: 0 Vascular Disease History: 0 Age Score: 1 Gender Score: 0      Radiology    DG Chest Port 1 View Result Date: 10/10/2023 CLINICAL DATA:  Supraventricular tachycardia. History of DVT and pulmonary embolus. EXAM: PORTABLE CHEST 1 VIEW COMPARISON:  10/08/2023 FINDINGS: Mild cardiac enlargement. Peribronchial thickening with central interstitial changes consistent with chronic bronchitis. Unfortunately a patches placed over the left mid and lower lung zone which limits evaluation but  superimposed infiltrates seen on prior study on the left appear improved. No pleural effusion or pneumothorax. Mediastinal contours appear intact. IMPRESSION: Chronic bronchitic changes in the lungs. Probable improvement of previous left lung infiltrates. Electronically Signed   By: Burman Nieves M.D.   On: 10/10/2023 22:30    Patient Profile     68 y.o. male with a hx of atrial flutter, HTN, RBBB, ETOH/tobacco/heroin/cocaine abuse, hx of PE, DVT, HLD and prostate cancer who is being seen 10/10/2023 for the evaluation of SVT, refractory at the request of Dr. Karene Fry.    Assessment & Plan    SVT History of paroxysmal atrial flutter -He has a history of atrial flutter in the past and was given amiodarone and anticoagulation at a prior hospital consult but currently is not on anticoagulation -CHADS2VASC score is 3 -Presented with SVT at a rate of 167 bpm not responsive to adenosine but then cardioverted with 120 J shock to sinus rhythm lasting 30 seconds.  Given IV Cardizem and converted spontaneously to sinus rhythm -Currently maintaining normal sinus rhythm -UDS negative -2D echo 08/31/2021 showed EF 60 to 65% with mild LVH and G1 DD -2D echo this admission normal EF 55 to 60% and normal diastolic function with no focal wall motion abnormalities -TSH normal at 1.22 and potassium 4.8 with mag 2 today (was 1.6 on admission) -stop IV Cardizem drip and transition to oral Cardizem CD180 mg daily -D/C PTA amlodipine -Avoid beta-blocker in the setting of acute wheezing -Will get back in with EP for further evaluation of his SVT with history of atrial flutter.  -Currently patient has not been taking anticoagulation and since there is no evidence of a flutter on this admission we will not restart it at this time until he is seen by EP  Exertional chest pain Shortness of breath Chronic diastolic CHF CAP/acute bronchitis -BNP mildly elevated today but chest x-ray does not show edema and overall  appears euvolemic  -Chest x-ray showed chronic bronchitic changes and improvement of previous left lung infiltrate -O2 sats 84% on room air -He is a poor historian and I cannot really get a for how long the shortness of breath has been.  He did recently have CAP/acute bronchitis and cannot tell if his chest pain and shortness of breath are still from residual infection or due to underlying CAD -Symptoms are concerning for exertional angina as they come on with exertion associated with shortness of breath and chest pain across his chest and resolves with rest -2D echo this admission demonstrates EF 55 to 60% with normal diastolic function and RAP  estimated 15 mmHg -Check high-sensitivity troponin x 2 -If troponins are normal then would not pursue further inpatient ischemic workup until he has recovered from his pneumonia/bronchitis.  If troponin is elevated then would get a coronary CTA to define coronary anatomy -Treatment for CAP/bronchitis per TRH  I spent 35 minutes caring for this patient today face to face, ordering and reviewing labs, reviewing records from 2D echo this admission, seeing the patient, documenting in the record   For questions or updates, please contact [facility] Please consult [email or web address] for contact info under        Signed, Armanda Magic, MD  10/11/2023, 10:55 AM

## 2023-10-11 NOTE — Progress Notes (Signed)
 Transition of Care [facility]) - Inpatient Brief Assessment   Patient Details  Name: Todd [name]. MRN: [id number] Date of Birth: 10-24-1955  Transition of Care [facility]) CM/SW Contact:    Janae Bridgeman, RN Phone Number: 10/11/2023, 3:10 PM   Clinical Narrative: CM met with the patient at the bedside and patient was provided with [facility] letter and Code 44 was completed.  Patient states that he admitted to the hospital from a rooming house.  Patient states that he does not want to return to the home since it has black mold and is damp and cold to continue living at the home.  I asked that the patient reach out to family to assist with find him another apartment in the community if possible.    Patient was seen by PT and SNF placement is recommended.  Patient was agreeable.  DME at the home includes rolator, grab bars.  SNF workup will be started.  [place], MSW is aware and will follow up when bed offers are available tomorrow.   Transition of Care Asessment: Insurance and Status: (P) Insurance coverage has been reviewed Patient has primary care physician: (P) Yes Home environment has been reviewed: (P) from home alone Prior level of function:: (P) Independent Prior/Current Home Services: (P) No current home services Social Drivers of Health Review: (P) SDOH reviewed needs interventions Readmission risk has been reviewed: (P) Yes Transition of care needs: (P) transition of care needs identified, TOC will continue to follow

## 2023-10-11 NOTE — Care Management CC44 (Cosign Needed)
 Condition Code 44 Documentation Completed  Patient Details  Name: Todd [name]. MRN: [id number] Date of Birth: 12-17-1955   Condition Code 44 given:  Yes Patient signature on Condition Code 44 notice:  Yes Documentation of 2 MD's agreement:  Yes Code 44 added to claim:  Yes    Janae Bridgeman, RN 10/11/2023, 2:57 PM

## 2023-10-11 NOTE — Evaluation (Signed)
 Physical Therapy Evaluation Patient Details Name: Todd [name]. MRN: [id number] DOB: 1956-05-11 Today's Date: 10/11/2023  History of Present Illness  68 y.o. male presents to [facility] hospital on 10/10/2023 with SVT. Pt was recently diagnosed with CAP on 10/08/23. PMH includes PAF, HTN, chronic diastolic HF.  Clinical Impression  Pt presents to PT with deficits in functional mobility, gait, balance, strength, power, endurance. Pt is limited by LLE pain and weakness at this time, requiring significant physical assistance to stand and with very poor tolerance for ambulation. Pt is at a high risk for falls due to BLE weakness and instability. Patient will benefit from continued inpatient follow up therapy, <3 hours/day.        If plan is discharge home, recommend the following: A lot of help with walking and/or transfers;A lot of help with bathing/dressing/bathroom;Assistance with cooking/housework;Assist for transportation;Help with stairs or ramp for entrance   Can travel by private vehicle   Yes    Equipment Recommendations Wheelchair (measurements PT);Wheelchair cushion (measurements PT)  Recommendations for Other Services       Functional Status Assessment Patient has had a recent decline in their functional status and demonstrates the ability to make significant improvements in function in a reasonable and predictable amount of time.     Precautions / Restrictions Precautions Precautions: Fall Recall of Precautions/Restrictions: Intact Restrictions Weight Bearing Restrictions Per Provider Order: No      Mobility  Bed Mobility Overal bed mobility: Needs Assistance Bed Mobility: Supine to Sit, Sit to Supine     Supine to sit: Min assist Sit to supine: Mod assist        Transfers Overall transfer level: Needs assistance Equipment used: Rollator (4 wheels) Transfers: Sit to/from Stand Sit to Stand: Mod assist, From elevated surface                 Ambulation/Gait Ambulation/Gait assistance: Min assist Gait Distance (Feet): 6 Feet Assistive device: Rollator (4 wheels) Gait Pattern/deviations: Step-to pattern Gait velocity: reduced Gait velocity interpretation: <1.31 ft/sec, indicative of household ambulator   General Gait Details: slowed step-to gait, increased knee flexion throughout gait cycle  Stairs            Wheelchair Mobility     Tilt Bed    Modified Rankin (Stroke Patients Only)       Balance Overall balance assessment: Needs assistance Sitting-balance support: No upper extremity supported, Feet supported Sitting balance-Leahy Scale: Good     Standing balance support: Bilateral upper extremity supported, Reliant on assistive device for balance Standing balance-Leahy Scale: Poor                               Pertinent Vitals/Pain Pain Assessment Pain Assessment: Faces Faces Pain Scale: Hurts whole lot Pain Location: L knee Pain Descriptors / Indicators: Grimacing Pain Intervention(s): Monitored during session    Home Living Family/patient expects to be discharged to:: Other (Comment) Living Arrangements: Group Home Available Help at Discharge: Family;Available PRN/intermittently Type of Home: Other(Comment) (boarding house with individual apartments) Home Access: Stairs to enter Entrance Stairs-Rails: None Entrance Stairs-Number of Steps: 4   Home Layout: One level Home Equipment: Rollator (4 wheels);Grab bars - tub/shower      Prior Function Prior Level of Function : Needs assist             Mobility Comments: ambulatory with rollator, has been falling recently due to L knee weakness and pain ADLs Comments: Patient  states having to pay other residents for meals, uses microwave.  Able to bathe and dress himself at baseline, but takes about an hour.  Orders groceries for delivery.     Extremity/Trunk Assessment   Upper Extremity Assessment Upper Extremity  Assessment: Defer to OT evaluation RUE Deficits / Details: decreased end range to shoulder flexion RUE: Shoulder pain with ROM RUE Sensation: WNL RUE Coordination: WNL LUE Deficits / Details: decreased end range to shoulder flexion LUE: Shoulder pain with ROM LUE Sensation: WNL LUE Coordination: WNL    Lower Extremity Assessment Lower Extremity Assessment: Generalized weakness (PT notes edema at L knee)    Cervical / Trunk Assessment Cervical / Trunk Assessment: Kyphotic  Communication   Communication Communication: No apparent difficulties    Cognition Arousal: Alert Behavior During Therapy: WFL for tasks assessed/performed                             Following commands: Intact       Cueing Cueing Techniques: Verbal cues     General Comments General comments (skin integrity, edema, etc.): VSS on RA    Exercises     Assessment/Plan    PT Assessment Patient needs continued PT services  PT Problem List Decreased strength;Decreased range of motion;Decreased activity tolerance;Decreased balance;Decreased mobility;Decreased knowledge of use of DME;Pain       PT Treatment Interventions DME instruction;Gait training;Stair training;Functional mobility training;Therapeutic activities;Therapeutic exercise;Balance training;Neuromuscular re-education;Patient/family education    PT Goals (Current goals can be found in the Care Plan section)  Acute Rehab PT Goals Patient Stated Goal: to reduce L knee pain, improve strength and stop falling PT Goal Formulation: With patient Time For Goal Achievement: 10/25/23 Potential to Achieve Goals: Fair    Frequency Min 1X/week     Co-evaluation               AM-PAC PT "6 Clicks" Mobility  Outcome Measure Help needed turning from your back to your side while in a flat bed without using bedrails?: A Little Help needed moving from lying on your back to sitting on the side of a flat bed without using bedrails?: A  Little Help needed moving to and from a bed to a chair (including a wheelchair)?: A Lot Help needed standing up from a chair using your arms (e.g., wheelchair or bedside chair)?: A Lot Help needed to walk in hospital room?: Total Help needed climbing 3-5 steps with a railing? : Total 6 Click Score: 12    End of Session Equipment Utilized During Treatment: Gait belt Activity Tolerance: Patient limited by pain Patient left: in bed;with call bell/phone within reach;with family/visitor present Nurse Communication: Mobility status PT Visit Diagnosis: Other abnormalities of gait and mobility (R26.89);Muscle weakness (generalized) (M62.81);History of falling (Z91.81)    Time: 1610-9604 PT Time Calculation (min) (ACUTE ONLY): 18 min   Charges:   PT Evaluation $PT Eval Low Complexity: 1 Low   PT General Charges $$ ACUTE PT VISIT: 1 Visit         Arlyss Gandy, PT, DPT Acute Rehabilitation Office [phone]   Arlyss Gandy 10/11/2023, 1:17 PM

## 2023-10-11 NOTE — Care Management Obs Status (Cosign Needed)
 MEDICARE OBSERVATION STATUS NOTIFICATION   Patient Details  Name: Todd [name]. MRN: [id number] Date of Birth: [birth year]   Medicare Observation Status Notification Given:  Yes    Janae Bridgeman, RN 10/11/2023, 2:57 PM

## 2023-10-11 NOTE — NC FL2 (Signed)
 [place] MEDICAID FL2 LEVEL OF CARE FORM     IDENTIFICATION  Patient Name: Todd [name]. Birthdate: [birth year] Sex: male Admission Date (Current Location): 10/10/2023  [facility] and [place] Number:  Producer, television/film/video and Address:  [facility], [address], [place], [place] [address]      Provider Number: [id number]  Attending Physician Name and Address:  Almon Hercules, MD  Relative Name and Phone Number:       Current Level of Care: Hospital Recommended Level of Care: Skilled Nursing Facility Prior Approval Number:    Date Approved/Denied:   PASRR Number: [id number]  Discharge Plan: SNF    Current Diagnoses: Patient Active Problem List   Diagnosis Date Noted   Hypomagnesemia 10/11/2023   Macrocytic anemia 10/11/2023   Chronic diastolic CHF (congestive heart failure) (HCC) 10/11/2023   SOB (shortness of breath) 10/11/2023   Supraventricular tachycardia (HCC) 10/11/2023   SVT (supraventricular tachycardia) (HCC) 10/10/2023   Suicidal ideation 04/20/2023   Suicidal ideations 04/20/2023   Major depressive disorder, recurrent episode, severe, with psychosis (HCC) 02/15/2022   Cocaine dependence (HCC) 02/15/2022   Paroxysmal atrial flutter (HCC) 08/31/2021   Tachycardia 08/30/2021   Atrial flutter (HCC) 08/30/2021   Acute lower UTI 08/30/2021   CAP (community acquired pneumonia) 08/30/2021   Hyponatremia 08/30/2021   Rib fracture 08/30/2021   Compression fracture of body of thoracic vertebra (HCC) 08/30/2021   Pulmonary nodule 08/30/2021   Heroin overdose (HCC) 10/01/2020   Opiate overdose (HCC) 10/01/2020   Acute respiratory failure with hypoxia (HCC) 09/03/2020   Ulcerative esophagitis 06/30/2019   AMS (altered mental status) 04/03/2019   AKI (acute kidney injury) (HCC) 01/09/2019   Pulmonary embolus and infarction (HCC) 07/13/2018   Opioid use disorder, moderate, dependence (HCC) 06/29/2018   Alcohol abuse 06/27/2018   MDD  (major depressive disorder), recurrent severe, without psychosis (HCC) 06/26/2018   Varicose veins of left lower extremity with complications 08/15/2017   Benign paroxysmal positional vertigo 11/08/2013   RBBB 06/02/2009   Congenital anomaly of heart 06/02/2009   Non-insulin dependent type 2 diabetes mellitus (HCC) 04/11/2009   Substance abuse (HCC) 04/11/2009   Affective bipolar disorder (HCC) 04/11/2009   Primary hypertension 04/11/2009   Diverticulosis of colon 04/11/2009   CHOLECYSTITIS 04/11/2009   Chest pain of uncertain etiology 04/11/2009   Abdominal pain 04/11/2009    Orientation RESPIRATION BLADDER Height & Weight     Self, Time, Situation, Place  Normal Continent Weight: 90.7 kg Height:  5\' 9"  (175.3 cm)  BEHAVIORAL SYMPTOMS/MOOD NEUROLOGICAL BOWEL NUTRITION STATUS      Continent Diet (See discharge summary)  AMBULATORY STATUS COMMUNICATION OF NEEDS Skin   Extensive Assist Verbally Normal                       Personal Care Assistance Level of Assistance  Bathing, Feeding, Dressing, Total care Bathing Assistance: Limited assistance Feeding assistance: Limited assistance Dressing Assistance: Limited assistance     Functional Limitations Info  Sight, Hearing, Speech Sight Info: Adequate Hearing Info: Adequate Speech Info: Adequate    SPECIAL CARE FACTORS FREQUENCY  PT (By licensed PT), OT (By licensed OT)     PT Frequency: 5 x per week OT Frequency: 5 x per week            Contractures Contractures Info: Not present    Additional Factors Info  Code Status, Allergies Code Status Info: Full code Allergies Info: NKDA  Current Medications (10/11/2023):  This is the current hospital active medication list Current Facility-Administered Medications  Medication Dose Route Frequency Provider Last Rate Last Admin   acetaminophen (TYLENOL) tablet 1,000 mg  1,000 mg Oral Q6H PRN Howerter, Justin B, DO       Or   acetaminophen (TYLENOL)  suppository 650 mg  650 mg Rectal Q6H PRN Howerter, Justin B, DO       benzonatate (TESSALON) capsule 200 mg  200 mg Oral TID PRN Howerter, Justin B, DO       cefTRIAXone (ROCEPHIN) 1 g in sodium chloride 0.9 % 100 mL IVPB  1 g Intravenous Q24H Howerter, Justin B, DO   Stopped at 10/10/23 2154   cyanocobalamin (VITAMIN B12) injection 1,000 mcg  1,000 mcg Intramuscular Daily Candelaria Stagers T, MD   1,000 mcg at 10/11/23 1610   Followed by   Melene Muller ON 10/13/2023] cyanocobalamin (VITAMIN B12) tablet 1,000 mcg  1,000 mcg Oral Daily Gonfa, Taye T, MD       diltiazem (CARDIZEM CD) 24 hr capsule 180 mg  180 mg Oral Daily Candelaria Stagers T, MD   180 mg at 10/11/23 1229   doxycycline (VIBRAMYCIN) 100 mg in sodium chloride 0.9 % 250 mL IVPB  100 mg Intravenous Q12H Howerter, Justin B, DO 125 mL/hr at 10/11/23 0948 100 mg at 10/11/23 9604   folic acid (FOLVITE) tablet 1 mg  1 mg Oral Daily Candelaria Stagers T, MD   1 mg at 10/11/23 1229   insulin aspart (novoLOG) injection 0-6 Units  0-6 Units Subcutaneous TID WC Howerter, Justin B, DO       levalbuterol (XOPENEX) nebulizer solution 0.63 mg  0.63 mg Nebulization Q6H PRN Gonfa, Taye T, MD       melatonin tablet 3 mg  3 mg Oral QHS PRN Howerter, Justin B, DO       multivitamin with minerals tablet 1 tablet  1 tablet Oral Daily Candelaria Stagers T, MD   1 tablet at 10/11/23 1229   ondansetron (ZOFRAN) injection 4 mg  4 mg Intravenous Q6H PRN Howerter, Justin B, DO       oxyCODONE-acetaminophen (PERCOCET/ROXICET) 5-325 MG per tablet 1 tablet  1 tablet Oral Q4H PRN Howerter, Justin B, DO   1 tablet at 10/11/23 1415   pantoprazole (PROTONIX) EC tablet 40 mg  40 mg Oral Daily Candelaria Stagers T, MD   40 mg at 10/11/23 1229   thiamine (VITAMIN B1) tablet 100 mg  100 mg Oral Daily Candelaria Stagers T, MD   100 mg at 10/11/23 1229     Discharge Medications: Please see discharge summary for a list of discharge medications.  Relevant Imaging Results:  Relevant Lab Results:   Additional  Information SS# [id number]  Janae Bridgeman, RN

## 2023-10-12 ENCOUNTER — Observation Stay (HOSPITAL_COMMUNITY): Payer: Medicare HMO

## 2023-10-12 DIAGNOSIS — I5032 Chronic diastolic (congestive) heart failure: Secondary | ICD-10-CM | POA: Diagnosis not present

## 2023-10-12 DIAGNOSIS — J189 Pneumonia, unspecified organism: Secondary | ICD-10-CM | POA: Diagnosis not present

## 2023-10-12 DIAGNOSIS — I471 Supraventricular tachycardia, unspecified: Secondary | ICD-10-CM | POA: Diagnosis not present

## 2023-10-12 DIAGNOSIS — R0602 Shortness of breath: Secondary | ICD-10-CM | POA: Diagnosis not present

## 2023-10-12 DIAGNOSIS — N179 Acute kidney failure, unspecified: Secondary | ICD-10-CM | POA: Diagnosis not present

## 2023-10-12 DIAGNOSIS — I4892 Unspecified atrial flutter: Secondary | ICD-10-CM | POA: Diagnosis not present

## 2023-10-12 LAB — GLUCOSE, CAPILLARY
Glucose-Capillary: 105 mg/dL — ABNORMAL HIGH (ref 70–99)
Glucose-Capillary: 101 mg/dL — ABNORMAL HIGH (ref 70–99)
Glucose-Capillary: 107 mg/dL — ABNORMAL HIGH (ref 70–99)
Glucose-Capillary: 120 mg/dL — ABNORMAL HIGH (ref 70–99)

## 2023-10-12 LAB — HEMOGLOBIN A1C
Hgb A1c MFr Bld: 6.3 % — ABNORMAL HIGH (ref 4.8–5.6)
Mean Plasma Glucose: 134 mg/dL

## 2023-10-12 LAB — RESPIRATORY PANEL BY PCR

## 2023-10-12 MED ORDER — MUSCLE RUB 10-15 % EX CREA
TOPICAL_CREAM | CUTANEOUS | Status: DC | PRN
  Filled 2023-10-12: qty 85

## 2023-10-12 MED ORDER — TRAZODONE HCL 50 MG PO TABS
200.0000 mg | ORAL_TABLET | Freq: Every day | ORAL | Status: DC
  Administered 2023-10-12 – 2023-10-13 (×2): 200 mg via ORAL
  Filled 2023-10-12 (×2): qty 4

## 2023-10-12 NOTE — Progress Notes (Signed)
 Progress Note    Todd [name].   YNW:[id number]  DOB: Jan 05, 1956  DOA: 10/10/2023     1 PCP: Blima Singer, PA  Initial CC: SVT  Hospital Course: 68 year old M with PMH of diastolic CHF, DM-2, HTN, PE not on anticoagulation, recent pneumonia/bronchitis, anxiety, depression and osteoarthritis brought to ED by EMS from doctor's office due to SVT with HR to 170s on preoperative evaluation for knee replacement.  Patient was given 6 mg adenosine by EMS and converted to NSR with HR to 88.  However, heart rate elevated to 166 on arrival to ED. He remained in SVT despite additional 6 mg of adenosine and attempted electrocardioversion in ED. cardiology consulted.  He was started on Cardizem drip.  Echocardiogram ordered.    The next day, he converted to normal sinus rhythm.  Transition to p.o. Cardizem CD 180 mg.  He was cleared for discharge by cardiology with outpatient Zio patch and EP follow-up. However, patient is physically deconditioned partly due to his severe osteoarthritis.  Therapy recommended SNF.  A&P:  SVT Hx atrial flutter - Not on nodal blocking agent.  Not on anticoagulation either.  SVT was refractory to 2 doses of adenosine but resolved after IV Cardizem drip.  BNP was elevated to 812.  X-ray without vascular congestion or edema.  TSH is 1.22.  -Appreciate recs by cardiology - Cardizem CD 180 mg daily - discontinue home amlodipine - 2-week outpatient Zio patch - EP follow-up in 2 weeks - Encourage alcohol cessation - Optimize electrolytes   Chronic diastolic CHF:  - Endorses shortness of breath but appears euvolemic on exam.  CXR did not suggest acute CHF.  BNP elevated to 812 which could be due to SVT.  TTE in 08/2021 with LVEF of 60 to 65%, mild LVH, G1DD.  -Repeat echo showing EF 55 to 60%, no RWMA, normal diastolic parameters   Community-acquired pneumonia/acute bronchitis: - Recently started on doxycycline and prednisone for this.  CXR with chronic bronchitic  changes and probable improvement of previous left lung infiltrate.  He is COVID-19, influenza and RSV PCR was negative on 2/22 -Follow-up RVP - Continue Rocephin and doxycycline   AKI: Baseline Cr about 1.0.  Resolving.   Macrocytic anemia/vitamin B12 deficiency:  - Noted about 3 g drop in Hgb.  Patient denies melena or hematochezia.  He is not on anticoagulation or antiplatelet but seems to take naproxen.  Received about 1.2 L fluid in ED yesterday.  B12 low at 149.  Likely from alcohol use. -Continue monitoring.  Will check Hemoccult if hemoglobin drops further -B12 injection 1000 mcg x 2 followed by p.o. -Start p.o. Protonix -SCD for VTE prophylaxis until H&H is stable    Essential hypertension: Normotensive -P.o. Cardizem as above   NIDDM-2 with hyperglycemia: A1c 5.8%.  Was 8.8% in 2019.  Not on meds at home. -Continue SSI-very sensitive   Chronic pain/osteoarthritis/ambulatory dysfunction-followed by orthopedic surgery with planned knee replacement?  - Previously on opiates but has not filled any opiate since 07/06/2023 per narcotic database.  Started on Percocet this admission.  -Continue Tylenol and oxycodone as needed. -PT/OT-recommended SNF.   Dysphagia-reports oropharyngeal dysphagia -SLP eval -Continue dysphagia level 3   Tobacco use disorder-reports smoking cigarette but does not want to quantify.  Not interested in nicotine patch due to -Encouraged smoking cessation   Alcohol abuse: Reports drinking about a quart.  Last drink the day before admission.  Denies major withdrawal symptoms. -Counseled. -Multivitamin, thiamine and folic acid -Consult TOC  Body mass index is 29.53 kg/m.  Interval History:  No events overnight. Resting comfortable this morning. Fixated on knee but redirected and talked about cardiac workup and respiratory issues. He understands he'll need to continue knee surgery discussions outpatient.    Old records reviewed in assessment of this  patient  Antimicrobials: Rocephin 10/10/2023 >> current Doxycycline 10/10/2023 >> current  DVT prophylaxis:  SCDs Start: 10/10/23 2001   Code Status:   Code Status: Full Code  Mobility Assessment (Last 72 Hours)     Mobility Assessment     Row Name 10/12/23 1100 10/11/23 2132 10/11/23 1308 10/11/23 1054     Does patient have an order for bedrest or is patient medically unstable No - Continue assessment No - Continue assessment -- --    What is the highest level of mobility based on the progressive mobility assessment? Level 3 (Stands with assist) - Balance while standing  and cannot march in place Level 3 (Stands with assist) - Balance while standing  and cannot march in place Level 4 (Walks with assist in room) - Balance while marching in place and cannot step forward and back - Complete Level 2 (Chairfast) - Balance while sitting on edge of bed and cannot stand    Is the above level different from baseline mobility prior to current illness? Yes - Recommend PT order Yes - Recommend PT order -- --             Barriers to discharge: none Disposition Plan: SNF HH orders placed:  Status is: Obs  Objective: Blood pressure (!) 126/91, pulse 69, temperature 98.1 F (36.7 C), temperature source Oral, resp. rate 18, height 5\' 9"  (1.753 m), weight 98.5 kg, SpO2 98%.  Examination:  Physical Exam Constitutional:      General: He is not in acute distress.    Appearance: Normal appearance.  HENT:     Head: Normocephalic and atraumatic.     Mouth/Throat:     Mouth: Mucous membranes are moist.  Eyes:     Extraocular Movements: Extraocular movements intact.  Cardiovascular:     Rate and Rhythm: Normal rate and regular rhythm.  Pulmonary:     Effort: Pulmonary effort is normal. No respiratory distress.     Breath sounds: Normal breath sounds. No wheezing.  Abdominal:     General: Bowel sounds are normal. There is no distension.     Palpations: Abdomen is soft.     Tenderness:  There is no abdominal tenderness.  Musculoskeletal:        General: Normal range of motion.     Cervical back: Normal range of motion and neck supple.  Skin:    General: Skin is warm and dry.  Neurological:     General: No focal deficit present.     Mental Status: He is alert.  Psychiatric:        Mood and Affect: Mood normal.        Behavior: Behavior normal.      Consultants:    Procedures:    Data Reviewed: Results for orders placed or performed during the hospital encounter of 10/10/23 (from the past 24 hours)  Glucose, capillary     Status: Abnormal   Collection Time: 10/11/23 10:33 PM  Result Value Ref Range   Glucose-Capillary 131 (H) 70 - 99 mg/dL  Glucose, capillary     Status: Abnormal   Collection Time: 10/12/23 10:10 AM  Result Value Ref Range   Glucose-Capillary 105 (H) 70 - 99  mg/dL  Glucose, capillary     Status: Abnormal   Collection Time: 10/12/23 12:29 PM  Result Value Ref Range   Glucose-Capillary 101 (H) 70 - 99 mg/dL    I have reviewed pertinent nursing notes, vitals, labs, and images as necessary. I have ordered labwork to follow up on as indicated.  I have reviewed the last notes from staff over past 24 hours. I have discussed patient's care plan and test results with nursing staff, CM/SW, and other staff as appropriate.  Time spent: Greater than 50% of the 55 minute visit was spent in counseling/coordination of care for the patient as laid out in the A&P.   LOS: 1 day   Lewie Chamber, MD [facility] 10/12/2023, 4:19 PM

## 2023-10-12 NOTE — TOC Progression Note (Addendum)
 Transition of Care (TOC) - Progression Note    Patient Details  Name: Todd [name]. MRN: [id number] Date of Birth: 04-01-1956  Transition of Care [facility]) CM/SW Contact  Leiyah Maultsby A [place], LCSW Phone Number: 10/12/2023, 11:02 AM  Clinical Narrative:     Update 1256 CSW provided one bed offer for pt, he stated he wanted to be in [place], CSW informed him that he does not have any other options at this time. He stated he wanted to think about it. CSW to follow back up this afternoon regarding placement decision.   TOC will continue to follow.    Pt currently has no bed offers, re-faxed out further to surrounding areas.  TOC will continue to follow.        Expected Discharge Plan and Services                                               Social Determinants of Health (SDOH) Interventions SDOH Screenings   Food Insecurity: Food Insecurity Present (10/11/2023)  Housing: High Risk (10/11/2023)  Transportation Needs: Unmet Transportation Needs (10/11/2023)  Utilities: Not At Risk (10/11/2023)  Alcohol Screen: Low Risk  (04/20/2023)  Financial Resource Strain: Low Risk  (10/27/2022)   Received from [facility]  Physical Activity: Inactive (10/27/2022)   Received from [facility]  Social Connections: Socially Isolated (10/11/2023)  Stress: Stress Concern Present (10/27/2022)   Received from [facility]  Tobacco Use: High Risk (10/10/2023)  Health Literacy: Low Risk  (10/27/2022)   Received from [facility]    Readmission Risk Interventions    10/11/2023    3:09 PM 09/02/2021    1:12 PM  Readmission Risk Prevention Plan  Transportation Screening Complete Complete  PCP or Specialist Appt within 3-5 Days Complete   HRI or Home Care Consult Complete Complete  Social Work Consult for Recovery Care Planning/Counseling Complete Complete  Palliative Care Screening Complete Not Applicable  Medication Review Oceanographer) Complete

## 2023-10-12 NOTE — Hospital Course (Addendum)
 68 year old M with PMH of diastolic CHF, DM-2, HTN, PE not on anticoagulation, recent pneumonia/bronchitis, anxiety, depression and osteoarthritis brought to ED by EMS from doctor's office due to SVT with HR to 170s on preoperative evaluation for knee replacement.  Patient was given 6 mg adenosine by EMS and converted to NSR with HR to 88.  However, heart rate elevated to 166 on arrival to ED. He remained in SVT despite additional 6 mg of adenosine and attempted electrocardioversion in ED. cardiology consulted.  He was started on Cardizem drip.  Echocardiogram ordered.    The next day, he converted to normal sinus rhythm.  Transition to p.o. Cardizem CD 180 mg.  He was cleared for discharge by cardiology with outpatient Zio patch and EP follow-up. However, patient is physically deconditioned partly due to his severe osteoarthritis.  Therapy recommended SNF.  A&P:  SVT Hx atrial flutter - Not on nodal blocking agent.  Not on anticoagulation either.  SVT was refractory to 2 doses of adenosine but resolved after IV Cardizem drip.  BNP was elevated to 812.  X-ray without vascular congestion or edema.  TSH is 1.22.  -Appreciate recs by cardiology - Cardizem CD 180 mg daily - discontinue home amlodipine - 2-week outpatient Zio patch - EP follow-up in 2 weeks - Encourage alcohol cessation - Optimized electrolytes   Chronic diastolic CHF:  - Endorses shortness of breath but appears euvolemic on exam.  CXR did not suggest acute CHF.  BNP elevated to 812 which could be due to SVT.  TTE in 08/2021 with LVEF of 60 to 65%, mild LVH, G1DD.  -Repeat echo showing EF 55 to 60%, no RWMA, normal diastolic parameters - coronary CTA perormed 2/27: minimal nonobstructive CAD; no further workup rec'd per cardiology    Community-acquired pneumonia/acute bronchitis: - Recently started on doxycycline and prednisone for this.  CXR with chronic bronchitic changes and probable improvement of previous left lung infiltrate.   He is COVID-19, influenza and RSV PCR was negative on 2/22 - RVP negative  - s/p Rocephin and doxycycline   AKI: Baseline Cr about 1.0.  Resolving   Macrocytic anemia/vitamin B12 deficiency:  - Noted about 3 g drop in Hgb.  Patient denies melena or hematochezia.  He is not on anticoagulation or antiplatelet but seems to take naproxen.  Received about 1.2 L fluid in ED yesterday.  B12 low at 149.  Likely from alcohol use. -Continue monitoring.  Will check Hemoccult if hemoglobin drops further -B12 injection 1000 mcg x 2 followed by p.o. -Start p.o. Protonix -SCD for VTE prophylaxis until H&H is stable    Essential hypertension: Normotensive -P.o. Cardizem as above   NIDDM-2 with hyperglycemia: A1c 5.8%.  Was 8.8% in 2019.  Not on meds at home. -Continue SSI-very sensitive   Chronic pain Osteoarthritis Ambulatory dysfunction - Previously on opiates but has not filled any opiate since 07/06/2023 per narcotic database.  Started on Percocet this admission.  -Continue Tylenol and oxycodone as needed. -PT/OT-recommended SNF -Outpatient follow-up with orthopedic surgery to continue knee replacement workup   Dysphagia-reports oropharyngeal dysphagia -SLP eval -Continue dysphagia level 3; advanced to regular diet per patient request to try   Tobacco use disorder-reports smoking cigarette but does not want to quantify.  Not interested in nicotine patch -Encouraged smoking cessation   Alcohol abuse: Reports drinking about a quart.  Last drink the day before admission.  Denies major withdrawal symptoms. -Counseled. -Multivitamin, thiamine and folic acid -Consult TOC   Body mass index is  29.53 kg/m.

## 2023-10-12 NOTE — TOC Progression Note (Addendum)
 Transition of Care (TOC) - Progression Note    Patient Details  Name: Todd [name]. MRN: [id number] Date of Birth: August 26, 1955  Transition of Care [facility]) CM/SW Contact  Janae Bridgeman, RN Phone Number: 10/12/2023, 3:26 PM  Clinical Narrative:    CM met with the patient at the bedside to continue discussion regarding SNF placement versus home with home health.  Patient states that he called his niece and is waiting to hear back from her regarding what he wants to do regarding discharge plans.  I will follow up along with MSW to continue discuss with the patient since SNF placement is recommended and patient lives alone in a boarding house.  Patient states that he does not want to transfer to a SNF facility in [place] since he plans to have follow up with Dr. Thurston Hole to needed Left knee arthroscopy.  Patient transferred to [facility] after he developed SVT at the clinic for his pre-op visit.  I called and left a detailed voicemail with [facility] to see if they would provide a bed offer.  10/12/23  1639 - Star, CM with [facility] said they did not have beds today but would review since they are in network with [facility].          Expected Discharge Plan and Services                                               Social Determinants of Health (SDOH) Interventions SDOH Screenings   Food Insecurity: Food Insecurity Present (10/11/2023)  Housing: High Risk (10/11/2023)  Transportation Needs: Unmet Transportation Needs (10/11/2023)  Utilities: Not At Risk (10/11/2023)  Alcohol Screen: Low Risk  (04/20/2023)  Financial Resource Strain: Low Risk  (10/27/2022)   Received from [facility]  Physical Activity: Inactive (10/27/2022)   Received from [facility]  Social Connections: Socially Isolated (10/11/2023)  Stress: Stress Concern Present (10/27/2022)   Received from [facility]  Tobacco Use: High Risk (10/10/2023)  Health Literacy: Low Risk  (10/27/2022)    Received from [facility]    Readmission Risk Interventions    10/11/2023    3:09 PM 09/02/2021    1:12 PM  Readmission Risk Prevention Plan  Transportation Screening Complete Complete  PCP or Specialist Appt within 3-5 Days Complete   HRI or Home Care Consult Complete Complete  Social Work Consult for Recovery Care Planning/Counseling Complete Complete  Palliative Care Screening Complete Not Applicable  Medication Review Oceanographer) Complete

## 2023-10-12 NOTE — Plan of Care (Signed)

## 2023-10-12 NOTE — Progress Notes (Addendum)
 Progress Note  Patient Name: Todd [name]. Date of Encounter: 10/12/2023  [facility] Cardiologist: Nicki Guadalajara, MD   Patient Profile     Subjective   Admitted to the ER with SVT and given adenosine which briefly slowed the rhythm but reoccurred and cardioverted to sinus rhythm with 120 J shock.  He remained in sinus for 30 seconds then went back into SVT but then converted on IV Cardizem to sinus rhythm.  UDS negative.  He has been normal sinus rhythm since converting last night  Still complains of pain in the chest at times but now says that it occurs after he eats and hurts and feels like food gets stuck when he eats.  He is a very poor historian and changes his story frequently.  Now being worked up for dysphagia.    Maintaining NSR on exam today  Inpatient Medications    Scheduled Meds:  cyanocobalamin  1,000 mcg Intramuscular Daily   Followed by   Melene Muller ON 10/13/2023] vitamin B-12  1,000 mcg Oral Daily   diltiazem  180 mg Oral Daily   folic acid  1 mg Oral Daily   insulin aspart  0-6 Units Subcutaneous TID WC   multivitamin with minerals  1 tablet Oral Daily   pantoprazole  40 mg Oral Daily   thiamine  100 mg Oral Daily   Continuous Infusions:  cefTRIAXone (ROCEPHIN)  IV 1 g (10/11/23 2135)   doxycycline (VIBRAMYCIN) IV 100 mg (10/11/23 2139)   PRN Meds: acetaminophen **OR** acetaminophen, benzonatate, levalbuterol, melatonin, ondansetron (ZOFRAN) IV, oxyCODONE-acetaminophen   Vital Signs    Vitals:   10/11/23 1403 10/11/23 1552 10/12/23 0500 10/12/23 0502  BP: 139/85 120/75  105/61  Pulse:  79  80  Resp: 13 18  18   Temp: 98.4 F (36.9 C) 98 F (36.7 C)  98.9 F (37.2 C)  TempSrc: Oral Oral  Oral  SpO2: 95% 95%  95%  Weight:   98.5 kg   Height:        Intake/Output Summary (Last 24 hours) at 10/12/2023 0827 Last data filed at 10/12/2023 8295 Gross per 24 hour  Intake 1200 ml  Output 1730 ml  Net -530 ml      10/12/2023    5:00 AM  10/10/2023    1:59 PM 08/08/2023    4:28 PM  Last 3 Weights  Weight (lbs) 217 lb 2.5 oz 200 lb 208 lb  Weight (kg) 98.5 kg 90.719 kg 94.348 kg      Telemetry    Normal sinus rhythm- Personally Reviewed  ECG  No new EKG to review Personally Reviewed  Physical Exam   GEN: Well nourished, well developed in no acute distress HEENT: Normal NECK: No JVD; No carotid bruits LYMPHATICS: No lymphadenopathy CARDIAC:RRR, no murmurs, rubs, gallops RESPIRATORY:  Clear to auscultation without rales, wheezing or rhonchi  ABDOMEN: Soft, non-tender, non-distended MUSCULOSKELETAL:  No edema; No deformity  SKIN: Warm and dry NEUROLOGIC:  Alert and oriented x 3 PSYCHIATRIC:  Normal affect  Labs    High Sensitivity Troponin:   Recent Labs  Lab 10/11/23 1329 10/11/23 1535  TROPONINIHS 13 13      Chemistry Recent Labs  Lab 10/10/23 1428 10/11/23 0415  NA 139 135  K 4.3 4.8  CL 109 108  CO2 19* 22  GLUCOSE 93 139*  BUN 15 14  CREATININE 1.32* 1.09  CALCIUM 8.5* 8.1*  PROT 6.7 5.8*  ALBUMIN 2.9* 2.6*  AST 20 20  ALT  14 11  ALKPHOS 59 50  BILITOT 0.6 0.5  GFRNONAA 59* >60  ANIONGAP 11 5     Hematology Recent Labs  Lab 10/10/23 1428 10/11/23 0415  WBC 5.3 3.7*  RBC 3.82* 3.29*  HGB 12.5* 10.9*  HCT 38.9* 34.0*  MCV 101.8* 103.3*  MCH 32.7 33.1  MCHC 32.1 32.1  RDW 16.7* 16.5*  PLT 188 140*    BNP Recent Labs  Lab 10/10/23 1425  BNP 812.3*     DDimer  Recent Labs  Lab 10/10/23 1428  DDIMER 0.51*     CHA2DS2-VASc Score = 3   This indicates a 3.2% annual risk of stroke. The patient's score is based upon: CHF History: 0 HTN History: 1 Diabetes History: 1 Stroke History: 0 Vascular Disease History: 0 Age Score: 1 Gender Score: 0      Radiology    ECHOCARDIOGRAM COMPLETE Result Date: 10/11/2023    ECHOCARDIOGRAM REPORT   Patient Name:   Todd Mani. Date of Exam: 10/11/2023 Medical Rec #:  [id number]          Height:       69.0 in  Accession #:    [id number]         Weight:       200.0 lb Date of Birth:  1956-07-18          BSA:          2.066 m Patient Age:    4 years           BP:           110/80 mmHg Patient Gender: M                  HR:           78 bpm. Exam Location:  Inpatient Procedure: 2D Echo (Both Spectral and Color Flow Doppler were utilized during            procedure). Indications:    SVT (supraventricular tachycardia) (HCC) [202906]  History:        Patient has prior history of Echocardiogram examinations, most                 recent 08/31/2021. ETOH, Cocaine and opiate abuse,                 Arrythmias:RBBB and Atrial Flutter; Risk Factors:Hypertension                 and Current Smoker.  Sonographer:    Dondra Prader RVT RCS Referring Phys: [id number] Lynda Rainwater A ACHARYA IMPRESSIONS  1. Left ventricular ejection fraction, by estimation, is 55 to 60%. The left ventricle has normal function. The left ventricle has no regional wall motion abnormalities. Left ventricular diastolic parameters were normal.  2. Right ventricular systolic function is normal. The right ventricular size is normal. There is mildly elevated pulmonary artery systolic pressure.  3. Left atrial size was mildly dilated.  4. The mitral valve is normal in structure. Trivial mitral valve regurgitation. No evidence of mitral stenosis.  5. The aortic valve is tricuspid. Aortic valve regurgitation is not visualized. No aortic stenosis is present.  6. The inferior vena cava is dilated in size with <50% respiratory variability, suggesting right atrial pressure of 15 mmHg. FINDINGS  Left Ventricle: Left ventricular ejection fraction, by estimation, is 55 to 60%. The left ventricle has normal function. The left ventricle has no regional wall motion abnormalities. Strain imaging was not performed. The left ventricular  internal cavity  size was normal in size. There is no left ventricular hypertrophy. Left ventricular diastolic parameters were normal. Right Ventricle: The  right ventricular size is normal. No increase in right ventricular wall thickness. Right ventricular systolic function is normal. There is mildly elevated pulmonary artery systolic pressure. The tricuspid regurgitant velocity is 2.66  m/s, and with an assumed right atrial pressure of 15 mmHg, the estimated right ventricular systolic pressure is 43.3 mmHg. Left Atrium: Left atrial size was mildly dilated. Right Atrium: Right atrial size was normal in size. Pericardium: There is no evidence of pericardial effusion. Mitral Valve: The mitral valve is normal in structure. Trivial mitral valve regurgitation. No evidence of mitral valve stenosis. Tricuspid Valve: The tricuspid valve is normal in structure. Tricuspid valve regurgitation is mild . No evidence of tricuspid stenosis. Aortic Valve: The aortic valve is tricuspid. Aortic valve regurgitation is not visualized. No aortic stenosis is present. Aortic valve mean gradient measures 4.5 mmHg. Aortic valve peak gradient measures 7.7 mmHg. Aortic valve area, by VTI measures 3.06 cm. Pulmonic Valve: The pulmonic valve was normal in structure. Pulmonic valve regurgitation is trivial. No evidence of pulmonic stenosis. Aorta: The aortic root is normal in size and structure. Venous: The inferior vena cava is dilated in size with less than 50% respiratory variability, suggesting right atrial pressure of 15 mmHg. IAS/Shunts: No atrial level shunt detected by color flow Doppler. Additional Comments: 3D imaging was not performed.  LEFT VENTRICLE PLAX 2D LVIDd:         4.90 cm   Diastology LVIDs:         2.90 cm   LV e' medial:    6.96 cm/s LV PW:         1.20 cm   LV E/e' medial:  11.8 LV IVS:        1.00 cm   LV e' lateral:   11.00 cm/s LVOT diam:     2.10 cm   LV E/e' lateral: 7.5 LV SV:         80 LV SV Index:   39 LVOT Area:     3.46 cm  RIGHT VENTRICLE             IVC RV Basal diam:  3.10 cm     IVC diam: 2.40 cm RV Mid diam:    2.30 cm RV S prime:     10.40 cm/s TAPSE  (M-mode): 2.4 cm LEFT ATRIUM             Index        RIGHT ATRIUM           Index LA diam:        4.50 cm 2.18 cm/m   RA Area:     17.00 cm LA Vol (A2C):   64.9 ml 31.41 ml/m  RA Volume:   41.80 ml  20.23 ml/m LA Vol (A4C):   69.2 ml 33.49 ml/m LA Biplane Vol: 75.4 ml 36.50 ml/m  AORTIC VALVE                    PULMONIC VALVE AV Area (Vmax):    2.99 cm     PV Vmax:       0.68 m/s AV Area (Vmean):   3.01 cm     PV Peak grad:  1.8 mmHg AV Area (VTI):     3.06 cm AV Vmax:           139.00 cm/s AV Vmean:  93.850 cm/s AV VTI:            0.263 m AV Peak Grad:      7.7 mmHg AV Mean Grad:      4.5 mmHg LVOT Vmax:         120.00 cm/s LVOT Vmean:        81.500 cm/s LVOT VTI:          0.232 m LVOT/AV VTI ratio: 0.88  AORTA Ao Root diam: 3.40 cm Ao Asc diam:  3.60 cm MITRAL VALVE               TRICUSPID VALVE MV Area (PHT): 4.21 cm    TR Peak grad:   28.3 mmHg MV Decel Time: 180 msec    TR Vmax:        266.00 cm/s MV E velocity: 82.10 cm/s MV A velocity: 67.40 cm/s  SHUNTS MV E/A ratio:  1.22        Systemic VTI:  0.23 m                            Systemic Diam: 2.10 cm Clearnce Hasten Electronically signed by Clearnce Hasten Signature Date/Time: 10/11/2023/11:49:31 AM    Final    DG Chest Port 1 View Result Date: 10/10/2023 CLINICAL DATA:  Supraventricular tachycardia. History of DVT and pulmonary embolus. EXAM: PORTABLE CHEST 1 VIEW COMPARISON:  10/08/2023 FINDINGS: Mild cardiac enlargement. Peribronchial thickening with central interstitial changes consistent with chronic bronchitis. Unfortunately a patches placed over the left mid and lower lung zone which limits evaluation but superimposed infiltrates seen on prior study on the left appear improved. No pleural effusion or pneumothorax. Mediastinal contours appear intact. IMPRESSION: Chronic bronchitic changes in the lungs. Probable improvement of previous left lung infiltrates. Electronically Signed   By: Burman Nieves M.D.   On: 10/10/2023 22:30     Patient Profile     68 y.o. male with a hx of atrial flutter, HTN, RBBB, ETOH/tobacco/heroin/cocaine abuse, hx of PE, DVT, HLD and prostate cancer who is being seen 10/10/2023 for the evaluation of SVT, refractory at the request of Dr. Karene Fry.    Assessment & Plan    SVT History of paroxysmal atrial flutter -He has a history of atrial flutter in the past and was given amiodarone and anticoagulation at a prior hospital consult but currently is not on anticoagulation -CHADS2VASC score is 3 -Presented with SVT at a rate of 167 bpm not responsive to adenosine but then cardioverted with 120 J shock to sinus rhythm lasting 30 seconds.  Given IV Cardizem and converted spontaneously to sinus rhythm -Remains in NSR this am -UDS negative -2D echo 08/31/2021 showed EF 60 to 65% with mild LVH and G1 DD -2D echo this admission normal EF 55 to 60% and normal diastolic function with no focal wall motion abnormalities -TSH normal at 1.22  -Avoid beta-blocker in the setting of acute wheezing -continue Cardizem CD 180mg  daily -Will get back in with EP for further evaluation of his SVT with history of atrial flutter.   -Currently patient has not been taking anticoagulation and since there is no evidence of a flutter on this admission we will not restart it at this time until he is seen by EP  Exertional chest pain Shortness of breath Chronic diastolic CHF CAP/acute bronchitis -BNP mildly elevated today but chest x-ray does not show edema and overall appears euvolemic  -Chest x-ray showed chronic bronchitic changes and  improvement of previous left lung infiltrate -O2 sats 98% on RA -He is a poor historian and I cannot really get a for how long the shortness of breath has been.  He did recently have CAP/acute bronchitis and cannot tell if his chest pain and shortness of breath are from residual infection.  Today he told me that he only gets the pain after he eats and hurts to swallow across his chest  but the day prior told me he has exertional CP so not consistent at all with is history. -2D echo this admission demonstrates EF 55 to 60% with normal diastolic function and RAP estimated 15 mmHg -hs trop normal x 2 -he had coronary artery calcifications on Chest CT 08/2021, continues to smoke, has HTN and DM2 so multiple CRFs -since LVF and hsTrop are normal, would recommend that he get over his CAP/bronchitis and then can plan outpt coronary CTA prior to getting his knee surgery -Treatment for CAP/bronchitis per TRH  I spent 35 minutes caring for this patient today face to face, ordering and reviewing labs, reviewing records from 2D echo this admission, seeing the patient, documenting in the record   For questions or updates, please contact [facility] Please consult [email or web address] for contact info under        Signed, Armanda Magic, MD  10/12/2023, 8:27 AM

## 2023-10-13 ENCOUNTER — Observation Stay (HOSPITAL_COMMUNITY): Payer: Medicare HMO

## 2023-10-13 DIAGNOSIS — I700 Atherosclerosis of aorta: Secondary | ICD-10-CM | POA: Diagnosis not present

## 2023-10-13 DIAGNOSIS — N179 Acute kidney failure, unspecified: Secondary | ICD-10-CM | POA: Diagnosis not present

## 2023-10-13 DIAGNOSIS — R079 Chest pain, unspecified: Secondary | ICD-10-CM | POA: Diagnosis not present

## 2023-10-13 DIAGNOSIS — I471 Supraventricular tachycardia, unspecified: Secondary | ICD-10-CM | POA: Diagnosis not present

## 2023-10-13 DIAGNOSIS — J189 Pneumonia, unspecified organism: Secondary | ICD-10-CM | POA: Diagnosis not present

## 2023-10-13 DIAGNOSIS — R0602 Shortness of breath: Secondary | ICD-10-CM | POA: Diagnosis not present

## 2023-10-13 LAB — GLUCOSE, CAPILLARY
Glucose-Capillary: 144 mg/dL — ABNORMAL HIGH (ref 70–99)
Glucose-Capillary: 97 mg/dL (ref 70–99)
Glucose-Capillary: 110 mg/dL — ABNORMAL HIGH (ref 70–99)

## 2023-10-13 MED ORDER — IOHEXOL 350 MG/ML SOLN
95.0000 mL | Freq: Once | INTRAVENOUS | Status: AC | PRN
  Administered 2023-10-13: 95 mL via INTRAVENOUS

## 2023-10-13 MED ORDER — NITROGLYCERIN 0.4 MG SL SUBL
0.8000 mg | SUBLINGUAL_TABLET | Freq: Once | SUBLINGUAL | Status: AC
  Administered 2023-10-13: 0.8 mg via SUBLINGUAL

## 2023-10-13 MED ORDER — METOPROLOL TARTRATE 50 MG PO TABS: 100.0000 mg | ORAL_TABLET | Freq: Once | ORAL | Status: DC | PRN

## 2023-10-13 MED ORDER — NITROGLYCERIN 0.4 MG SL SUBL
SUBLINGUAL_TABLET | SUBLINGUAL | Status: AC
  Filled 2023-10-13: qty 2

## 2023-10-13 MED ORDER — METOPROLOL TARTRATE 5 MG/5ML IV SOLN
INTRAVENOUS | Status: AC
  Filled 2023-10-13: qty 5

## 2023-10-13 NOTE — Plan of Care (Signed)
   Problem: Fluid Volume: Goal: Ability to maintain a balanced intake and output will improve Outcome: Progressing   Problem: Metabolic: Goal: Ability to maintain appropriate glucose levels will improve Outcome: Progressing   Problem: Nutritional: Goal: Maintenance of adequate nutrition will improve Outcome: Progressing

## 2023-10-13 NOTE — Progress Notes (Signed)
 Occupational Therapy Treatment Patient Details Name: Todd [name]. MRN: [id number] DOB: [birth year] Today's Date: 10/13/2023   History of present illness 68 y.o. male presents [facility] hospital on 10/10/2023 with SVT. Pt was recently diagnosed with CAP on 10/08/23. PMH includes PAF, HTN, chronic diastolic HF.   OT comments  Patient continues to be limited by L knee pain, chronic arthritic, scheduled for L TKA.  Patient able to sit EOB with Min A and HOB elevate with heavy use of side rails.  Mod A to lie back down with support for his legs.  Patient deferring OOB to the recliner, waiting for his lunch.  Patient assisted with positioning in bed, and OT will continue efforts in the acute setting to address deficits.  Patient will benefit from continued inpatient follow up therapy, >3 hours/day.      If plan is discharge home, recommend the following:  A lot of help with walking and/or transfers;A lot of help with bathing/dressing/bathroom;Assist for transportation;Assistance with cooking/housework;Help with stairs or ramp for entrance   Equipment Recommendations  Tub/shower bench    Recommendations for Other Services      Precautions / Restrictions Precautions Precautions: Fall Recall of Precautions/Restrictions: Intact Precaution/Restrictions Comments: L arthritic knee Restrictions Weight Bearing Restrictions Per Provider Order: No       Mobility Bed Mobility Overal bed mobility: Needs Assistance Bed Mobility: Supine to Sit, Sit to Supine     Supine to sit: Min assist, HOB elevated Sit to supine: Mod assist, HOB elevated        Transfers                   General transfer comment: Patient unable to stand on L knee due to pain.  Stated he was able to take a few steps eariler with 3 people.     Balance Overall balance assessment: Needs assistance Sitting-balance support: No upper extremity supported, Feet supported Sitting balance-Leahy Scale: Good Sitting balance  - Comments: difficulty leaning side to side.                                   ADL either performed or assessed with clinical judgement   ADL       Grooming: Wash/dry hands;Wash/dry face;Set up;Sitting                                      Extremity/Trunk Assessment Upper Extremity Assessment Upper Extremity Assessment: Generalized weakness RUE Deficits / Details: decreased end range to shoulder flexion RUE Sensation: WNL RUE Coordination: WNL LUE Deficits / Details: decreased end range to shoulder flexion LUE Sensation: WNL LUE Coordination: WNL   Lower Extremity Assessment Lower Extremity Assessment: Defer to PT evaluation   Cervical / Trunk Assessment Cervical / Trunk Assessment: Kyphotic    Vision Baseline Vision/History: 1 Wears glasses Patient Visual Report: No change from baseline     Perception Perception Perception: Not tested   Praxis Praxis Praxis: Not tested   Communication Communication Communication: No apparent difficulties   Cognition Arousal: Alert Behavior During Therapy: WFL for tasks assessed/performed Cognition: No apparent impairments                               Following commands: Intact        Cueing  Cueing Techniques: Verbal cues  Exercises      Shoulder Instructions       General Comments  VSS on RA    Pertinent Vitals/ Pain       Pain Assessment Faces Pain Scale: Hurts even more Pain Location: L knee Pain Descriptors / Indicators: Grimacing, Sharp Pain Intervention(s): Monitored during session                                                          Frequency  Min 1X/week        Progress Toward Goals  OT Goals(current goals can now be found in the care plan section)  Progress towards OT goals: Progressing toward goals  Acute Rehab OT Goals OT Goal Formulation: With patient Time For Goal Achievement: 10/25/23 Potential to Achieve Goals:  Fair  Plan      Co-evaluation                 AM-PAC OT "6 Clicks" Daily Activity     Outcome Measure   Help from another person eating meals?: None Help from another person taking care of personal grooming?: None Help from another person toileting, which includes using toliet, bedpan, or urinal?: A Lot Help from another person bathing (including washing, rinsing, drying)?: A Lot Help from another person to put on and taking off regular upper body clothing?: A Little Help from another person to put on and taking off regular lower body clothing?: A Lot 6 Click Score: 17    End of Session    OT Visit Diagnosis: Unsteadiness on feet (R26.81);Other abnormalities of gait and mobility (R26.89);Muscle weakness (generalized) (M62.81);Pain Pain - Right/Left: Left Pain - part of body: Knee   Activity Tolerance Patient tolerated treatment well   Patient Left in bed;with call bell/phone within reach   Nurse Communication Mobility status        Time: 4540-9811 OT Time Calculation (min): 18 min  Charges: OT General Charges $OT Visit: 1 Visit OT Treatments $Self Care/Home Management : 8-22 mins  10/13/2023  RP, OTR/L  Acute Rehabilitation Services  Office:  [phone]   Suzanna Obey 10/13/2023, 2:32 PM

## 2023-10-13 NOTE — Progress Notes (Signed)
 Mobility Specialist: Progress Note   10/13/23 1200  Mobility  Activity Ambulated with assistance in room  Level of Assistance Moderate assist, patient does 50-74%  Assistive Device Four wheel walker  Distance Ambulated (ft) 6 ft  Activity Response Tolerated well  Mobility Referral Yes  Mobility visit 1 Mobility  Mobility Specialist Start Time (ACUTE ONLY) 0902  Mobility Specialist Stop Time (ACUTE ONLY) [id number]  Mobility Specialist Time Calculation (min) (ACUTE ONLY) 19 min    Pt was somewhat agreeable to mobility session, kept stating he was unable to get up even with assistance but would try. Pt was argumentative throughout and unwilling to take MS suggestions initially. Suggested using RW considering his rollator brakes did not work well and was less stable, but pt refused. Received on EOB. MaxA for STS with heavy posterior lean upon initial stand that required extra time to correct before continuing with ambulation. ModA during ambulation for physical assistance as well as verbal and tactile cues for rollator proximity, step pattern, and correcting L lateral lean. Left in bed with all needs met, call bell in reach.   Maurene Capes Mobility Specialist Please contact via SecureChat or Rehab office at [phone]

## 2023-10-13 NOTE — Progress Notes (Signed)
 Progress Note    [name].   WUJ:[id number]  DOB: 1955/11/25  DOA: 10/10/2023     1 PCP: Blima Singer, PA  Initial CC: SVT  Hospital Course: 68 year old M with PMH of diastolic CHF, DM-2, HTN, PE not on anticoagulation, recent pneumonia/bronchitis, anxiety, depression and osteoarthritis brought to ED by EMS from doctor's office due to SVT with HR to 170s on preoperative evaluation for knee replacement.  Patient was given 6 mg adenosine by EMS and converted to NSR with HR to 88.  However, heart rate elevated to 166 on arrival to ED. He remained in SVT despite additional 6 mg of adenosine and attempted electrocardioversion in ED. cardiology consulted.  He was started on Cardizem drip.  Echocardiogram ordered.    The next day, he converted to normal sinus rhythm.  Transition to p.o. Cardizem CD 180 mg.  He was cleared for discharge by cardiology with outpatient Zio patch and EP follow-up. However, patient is physically deconditioned partly due to his severe osteoarthritis.  Therapy recommended SNF.  A&P:  SVT Hx atrial flutter - Not on nodal blocking agent.  Not on anticoagulation either.  SVT was refractory to 2 doses of adenosine but resolved after IV Cardizem drip.  BNP was elevated to 812.  X-ray without vascular congestion or edema.  TSH is 1.22.  -Appreciate recs by cardiology - Cardizem CD 180 mg daily - discontinue home amlodipine - 2-week outpatient Zio patch - EP follow-up in 2 weeks - Encourage alcohol cessation - Optimize electrolytes   Chronic diastolic CHF:  - Endorses shortness of breath but appears euvolemic on exam.  CXR did not suggest acute CHF.  BNP elevated to 812 which could be due to SVT.  TTE in 08/2021 with LVEF of 60 to 65%, mild LVH, G1DD.  -Repeat echo showing EF 55 to 60%, no RWMA, normal diastolic parameters - coronary CTA perormed 2/27: minimal nonobstructive CAD; no further workup rec'd per cardiology    Community-acquired pneumonia/acute  bronchitis: - Recently started on doxycycline and prednisone for this.  CXR with chronic bronchitic changes and probable improvement of previous left lung infiltrate.  He is COVID-19, influenza and RSV PCR was negative on 2/22 - RVP negative  - s/p Rocephin and doxycycline   AKI: Baseline Cr about 1.0.  Resolving.   Macrocytic anemia/vitamin B12 deficiency:  - Noted about 3 g drop in Hgb.  Patient denies melena or hematochezia.  He is not on anticoagulation or antiplatelet but seems to take naproxen.  Received about 1.2 L fluid in ED yesterday.  B12 low at 149.  Likely from alcohol use. -Continue monitoring.  Will check Hemoccult if hemoglobin drops further -B12 injection 1000 mcg x 2 followed by p.o. -Start p.o. Protonix -SCD for VTE prophylaxis until H&H is stable    Essential hypertension: Normotensive -P.o. Cardizem as above   NIDDM-2 with hyperglycemia: A1c 5.8%.  Was 8.8% in 2019.  Not on meds at home. -Continue SSI-very sensitive   Chronic pain/osteoarthritis/ambulatory dysfunction-followed by orthopedic surgery with planned knee replacement?  - Previously on opiates but has not filled any opiate since 07/06/2023 per narcotic database.  Started on Percocet this admission.  -Continue Tylenol and oxycodone as needed. -PT/OT-recommended SNF.   Dysphagia-reports oropharyngeal dysphagia -SLP eval -Continue dysphagia level 3   Tobacco use disorder-reports smoking cigarette but does not want to quantify.  Not interested in nicotine patch -Encouraged smoking cessation   Alcohol abuse: Reports drinking about a quart.  Last drink the day before  admission.  Denies major withdrawal symptoms. -Counseled. -Multivitamin, thiamine and folic acid -Consult TOC   Body mass index is 29.53 kg/m.  Interval History:  No events overnight. Resting comfortable this morning. Fixated on knee but redirected and talked about cardiac workup and respiratory issues. He understands he'll need to  continue knee surgery discussions outpatient.    Old records reviewed in assessment of this patient  Antimicrobials: Rocephin 10/10/2023 >> 2/26 Doxycycline 10/10/2023 >> 2/27  DVT prophylaxis:  SCDs Start: 10/10/23 2001   Code Status:   Code Status: Full Code  Mobility Assessment (Last 72 Hours)     Mobility Assessment     Row Name 10/13/23 1431 10/13/23 0849 10/12/23 2111 10/12/23 1100 10/11/23 2132   Does patient have an order for bedrest or is patient medically unstable -- No - Continue assessment No - Continue assessment No - Continue assessment No - Continue assessment   What is the highest level of mobility based on the progressive mobility assessment? Level 3 (Stands with assist) - Balance while standing  and cannot march in place Level 3 (Stands with assist) - Balance while standing  and cannot march in place Level 3 (Stands with assist) - Balance while standing  and cannot march in place Level 3 (Stands with assist) - Balance while standing  and cannot march in place Level 3 (Stands with assist) - Balance while standing  and cannot march in place   Is the above level different from baseline mobility prior to current illness? -- Yes - Recommend PT order Yes - Recommend PT order Yes - Recommend PT order Yes - Recommend PT order    Row Name 10/11/23 1308 10/11/23 1054         What is the highest level of mobility based on the progressive mobility assessment? Level 4 (Walks with assist in room) - Balance while marching in place and cannot step forward and back - Complete Level 2 (Chairfast) - Balance while sitting on edge of bed and cannot stand               Barriers to discharge: none Disposition Plan: SNF HH orders placed:  Status is: Obs  Objective: Blood pressure (!) 145/85, pulse 68, temperature 98 F (36.7 C), resp. rate 18, height 5\' 9"  (1.753 m), weight 97.6 kg, SpO2 94%.  Examination:  Physical Exam Constitutional:      General: He is not in acute  distress.    Appearance: Normal appearance.  HENT:     Head: Normocephalic and atraumatic.     Mouth/Throat:     Mouth: Mucous membranes are moist.  Eyes:     Extraocular Movements: Extraocular movements intact.  Cardiovascular:     Rate and Rhythm: Normal rate and regular rhythm.  Pulmonary:     Effort: Pulmonary effort is normal. No respiratory distress.     Breath sounds: Normal breath sounds. No wheezing.  Abdominal:     General: Bowel sounds are normal. There is no distension.     Palpations: Abdomen is soft.     Tenderness: There is no abdominal tenderness.  Musculoskeletal:        General: Normal range of motion.     Cervical back: Normal range of motion and neck supple.  Skin:    General: Skin is warm and dry.  Neurological:     General: No focal deficit present.     Mental Status: He is alert.  Psychiatric:        Mood and  Affect: Mood normal.        Behavior: Behavior normal.      Consultants:    Procedures:    Data Reviewed: Results for orders placed or performed during the hospital encounter of 10/10/23 (from the past 24 hours)  Glucose, capillary     Status: Abnormal   Collection Time: 10/12/23  5:26 PM  Result Value Ref Range   Glucose-Capillary 107 (H) 70 - 99 mg/dL  Glucose, capillary     Status: Abnormal   Collection Time: 10/12/23  9:08 PM  Result Value Ref Range   Glucose-Capillary 120 (H) 70 - 99 mg/dL  Glucose, capillary     Status: Abnormal   Collection Time: 10/13/23  7:51 AM  Result Value Ref Range   Glucose-Capillary 144 (H) 70 - 99 mg/dL   Comment 1 Notify RN    Comment 2 Document in Chart     I have reviewed pertinent nursing notes, vitals, labs, and images as necessary. I have ordered labwork to follow up on as indicated.  I have reviewed the last notes from staff over past 24 hours. I have discussed patient's care plan and test results with nursing staff, CM/SW, and other staff as appropriate.  Time spent: Greater than 50% of the  55 minute visit was spent in counseling/coordination of care for the patient as laid out in the A&P.   LOS: 1 day   Lewie Chamber, MD [facility] 10/13/2023, 4:28 PM

## 2023-10-13 NOTE — Progress Notes (Addendum)
 Progress Note  Patient Name: Todd [name]. Date of Encounter: 10/13/2023  [facility] Cardiologist: Nicki Guadalajara, MD   Patient Profile     Subjective   Admitted to the ER with SVT and given adenosine which briefly slowed the rhythm but reoccurred and cardioverted to sinus rhythm with 120 J shock.  He remained in sinus for 30 seconds then went back into SVT but then converted on IV Cardizem to sinus rhythm.  UDS negative.  He has been normal sinus rhythm since converting last night  Still complains of pain in the chest at times but now says that it occurs after he eats and hurts and feels like food gets stuck when he eats.  He is a very poor historian and changes his story frequently.  Now being worked up for dysphagia.    This am complaining DOE when walking in the halls and still having some CP.  Continues to maintain normal sinus rhythm.  Inpatient Medications    Scheduled Meds:  vitamin B-12  1,000 mcg Oral Daily   diltiazem  180 mg Oral Daily   folic acid  1 mg Oral Daily   insulin aspart  0-6 Units Subcutaneous TID WC   multivitamin with minerals  1 tablet Oral Daily   pantoprazole  40 mg Oral Daily   thiamine  100 mg Oral Daily   traZODone  200 mg Oral QHS   Continuous Infusions:  cefTRIAXone (ROCEPHIN)  IV 1 g (10/12/23 2109)   doxycycline (VIBRAMYCIN) IV 100 mg (10/12/23 2139)   PRN Meds: acetaminophen **OR** acetaminophen, benzonatate, levalbuterol, melatonin, Muscle Rub, ondansetron (ZOFRAN) IV, oxyCODONE-acetaminophen   Vital Signs    Vitals:   10/12/23 2003 10/13/23 0442 10/13/23 0500 10/13/23 0752  BP: 127/83 110/70  132/80  Pulse: 64 73  75  Resp: 18 18  18   Temp: 98.8 F (37.1 C) 97.9 F (36.6 C)  98 F (36.7 C)  TempSrc: Oral     SpO2: 99% 96%  94%  Weight:   97.6 kg   Height:        Intake/Output Summary (Last 24 hours) at 10/13/2023 0905 Last data filed at 10/13/2023 0551 Gross per 24 hour  Intake --  Output 2810 ml  Net -2810  ml      10/13/2023    5:00 AM 10/12/2023    5:00 AM 10/10/2023    1:59 PM  Last 3 Weights  Weight (lbs) 215 lb 2.7 oz 217 lb 2.5 oz 200 lb  Weight (kg) 97.6 kg 98.5 kg 90.719 kg      Telemetry    normal sinus rhythm- Personally Reviewed  ECG  No new EKG to review Personally Reviewed  Physical Exam   GEN: Well nourished, well developed in no acute distress HEENT: Normal NECK: No JVD; No carotid bruits LYMPHATICS: No lymphadenopathy CARDIAC:RRR, no murmurs, rubs, gallops RESPIRATORY:  Clear to auscultation without rales, wheezing or rhonchi  ABDOMEN: Soft, non-tender, non-distended MUSCULOSKELETAL:  No edema; No deformity  SKIN: Warm and dry NEUROLOGIC:  Alert and oriented x 3 PSYCHIATRIC:  Normal affect  Labs    High Sensitivity Troponin:   Recent Labs  Lab 10/11/23 1329 10/11/23 1535  TROPONINIHS 13 13      Chemistry Recent Labs  Lab 10/10/23 1428 10/11/23 0415  NA 139 135  K 4.3 4.8  CL 109 108  CO2 19* 22  GLUCOSE 93 139*  BUN 15 14  CREATININE 1.32* 1.09  CALCIUM 8.5* 8.1*  PROT  6.7 5.8*  ALBUMIN 2.9* 2.6*  AST 20 20  ALT 14 11  ALKPHOS 59 50  BILITOT 0.6 0.5  GFRNONAA 59* >60  ANIONGAP 11 5     Hematology Recent Labs  Lab 10/10/23 1428 10/11/23 0415  WBC 5.3 3.7*  RBC 3.82* 3.29*  HGB 12.5* 10.9*  HCT 38.9* 34.0*  MCV 101.8* 103.3*  MCH 32.7 33.1  MCHC 32.1 32.1  RDW 16.7* 16.5*  PLT 188 140*    BNP Recent Labs  Lab 10/10/23 1425  BNP 812.3*     DDimer  Recent Labs  Lab 10/10/23 1428  DDIMER 0.51*     CHA2DS2-VASc Score = 3   This indicates a 3.2% annual risk of stroke. The patient's score is based upon: CHF History: 0 HTN History: 1 Diabetes History: 1 Stroke History: 0 Vascular Disease History: 0 Age Score: 1 Gender Score: 0      Radiology    DG ESOPHAGUS W SINGLE CM (SOL OR THIN BA) Result Date: 10/12/2023 CLINICAL DATA:  68 year old male with complaint of "food not going down," globus sensation.  EXAM: ESOPHAGUS/BARIUM SWALLOW/TABLET STUDY TECHNIQUE: Single contrast examination was performed using thin liquid barium. This exam was performed by Loyce Dys PA-C, and was supervised and interpreted by Gilmer Mor, DO. FLUOROSCOPY: Radiation Exposure Index (as provided by the fluoroscopic device): 28.1 mGy Kerma COMPARISON:  None Available. FINDINGS: Swallowing: Appears normal. No vestibular penetration or aspiration seen. Pharynx: Unremarkable. Esophagus: Normal appearance. Esophageal motility: Limited assessment shows tertiary contractions present without effect on progression of barium bolus. Hiatal Hernia: Small, sliding-type hiatal hernia Gastroesophageal reflux: None visualized spontaneously or able to be induced. Ingested 13mm barium tablet: Became stuck at the gastroesophageal junction and did not pass after several minutes. Other: Limited study due to patient mobility and clinical status IMPRESSION: Small, sliding-type hiatal hernia. Barium tablet was suspended indefinitely at the hiatal hernia. Tertiary contractions present. Electronically Signed   By: Gilmer Mor D.O.   On: 10/12/2023 10:07   ECHOCARDIOGRAM COMPLETE Result Date: 10/11/2023    ECHOCARDIOGRAM REPORT   Patient Name:   Todd [name]. Date of Exam: 10/11/2023 Medical Rec #:  [id number]          Height:       69.0 in Accession #:    [id number]         Weight:       200.0 lb Date of Birth:  02-23-1956          BSA:          2.066 m Patient Age:    8 years           BP:           110/80 mmHg Patient Gender: M                  HR:           78 bpm. Exam Location:  Inpatient Procedure: 2D Echo (Both Spectral and Color Flow Doppler were utilized during            procedure). Indications:    SVT (supraventricular tachycardia) (HCC) [202906]  History:        Patient has prior history of Echocardiogram examinations, most                 recent 08/31/2021. ETOH, Cocaine and opiate abuse,                 Arrythmias:RBBB and Atrial Flutter;  Risk Factors:Hypertension                 and Current Smoker.  Sonographer:    Dondra Prader RVT RCS Referring Phys: [id number] Lynda Rainwater A ACHARYA IMPRESSIONS  1. Left ventricular ejection fraction, by estimation, is 55 to 60%. The left ventricle has normal function. The left ventricle has no regional wall motion abnormalities. Left ventricular diastolic parameters were normal.  2. Right ventricular systolic function is normal. The right ventricular size is normal. There is mildly elevated pulmonary artery systolic pressure.  3. Left atrial size was mildly dilated.  4. The mitral valve is normal in structure. Trivial mitral valve regurgitation. No evidence of mitral stenosis.  5. The aortic valve is tricuspid. Aortic valve regurgitation is not visualized. No aortic stenosis is present.  6. The inferior vena cava is dilated in size with <50% respiratory variability, suggesting right atrial pressure of 15 mmHg. FINDINGS  Left Ventricle: Left ventricular ejection fraction, by estimation, is 55 to 60%. The left ventricle has normal function. The left ventricle has no regional wall motion abnormalities. Strain imaging was not performed. The left ventricular internal cavity  size was normal in size. There is no left ventricular hypertrophy. Left ventricular diastolic parameters were normal. Right Ventricle: The right ventricular size is normal. No increase in right ventricular wall thickness. Right ventricular systolic function is normal. There is mildly elevated pulmonary artery systolic pressure. The tricuspid regurgitant velocity is 2.66  m/s, and with an assumed right atrial pressure of 15 mmHg, the estimated right ventricular systolic pressure is 43.3 mmHg. Left Atrium: Left atrial size was mildly dilated. Right Atrium: Right atrial size was normal in size. Pericardium: There is no evidence of pericardial effusion. Mitral Valve: The mitral valve is normal in structure. Trivial mitral valve regurgitation. No evidence of  mitral valve stenosis. Tricuspid Valve: The tricuspid valve is normal in structure. Tricuspid valve regurgitation is mild . No evidence of tricuspid stenosis. Aortic Valve: The aortic valve is tricuspid. Aortic valve regurgitation is not visualized. No aortic stenosis is present. Aortic valve mean gradient measures 4.5 mmHg. Aortic valve peak gradient measures 7.7 mmHg. Aortic valve area, by VTI measures 3.06 cm. Pulmonic Valve: The pulmonic valve was normal in structure. Pulmonic valve regurgitation is trivial. No evidence of pulmonic stenosis. Aorta: The aortic root is normal in size and structure. Venous: The inferior vena cava is dilated in size with less than 50% respiratory variability, suggesting right atrial pressure of 15 mmHg. IAS/Shunts: No atrial level shunt detected by color flow Doppler. Additional Comments: 3D imaging was not performed.  LEFT VENTRICLE PLAX 2D LVIDd:         4.90 cm   Diastology LVIDs:         2.90 cm   LV e' medial:    6.96 cm/s LV PW:         1.20 cm   LV E/e' medial:  11.8 LV IVS:        1.00 cm   LV e' lateral:   11.00 cm/s LVOT diam:     2.10 cm   LV E/e' lateral: 7.5 LV SV:         80 LV SV Index:   39 LVOT Area:     3.46 cm  RIGHT VENTRICLE             IVC RV Basal diam:  3.10 cm     IVC diam: 2.40 cm RV Mid diam:    2.30 cm RV  S prime:     10.40 cm/s TAPSE (M-mode): 2.4 cm LEFT ATRIUM             Index        RIGHT ATRIUM           Index LA diam:        4.50 cm 2.18 cm/m   RA Area:     17.00 cm LA Vol (A2C):   64.9 ml 31.41 ml/m  RA Volume:   41.80 ml  20.23 ml/m LA Vol (A4C):   69.2 ml 33.49 ml/m LA Biplane Vol: 75.4 ml 36.50 ml/m  AORTIC VALVE                    PULMONIC VALVE AV Area (Vmax):    2.99 cm     PV Vmax:       0.68 m/s AV Area (Vmean):   3.01 cm     PV Peak grad:  1.8 mmHg AV Area (VTI):     3.06 cm AV Vmax:           139.00 cm/s AV Vmean:          93.850 cm/s AV VTI:            0.263 m AV Peak Grad:      7.7 mmHg AV Mean Grad:      4.5 mmHg LVOT Vmax:          120.00 cm/s LVOT Vmean:        81.500 cm/s LVOT VTI:          0.232 m LVOT/AV VTI ratio: 0.88  AORTA Ao Root diam: 3.40 cm Ao Asc diam:  3.60 cm MITRAL VALVE               TRICUSPID VALVE MV Area (PHT): 4.21 cm    TR Peak grad:   28.3 mmHg MV Decel Time: 180 msec    TR Vmax:        266.00 cm/s MV E velocity: 82.10 cm/s MV A velocity: 67.40 cm/s  SHUNTS MV E/A ratio:  1.22        Systemic VTI:  0.23 m                            Systemic Diam: 2.10 cm Clearnce Hasten Electronically signed by Clearnce Hasten Signature Date/Time: 10/11/2023/11:49:31 AM    Final     Patient Profile     68 y.o. male with a hx of atrial flutter, HTN, RBBB, ETOH/tobacco/heroin/cocaine abuse, hx of PE, DVT, HLD and prostate cancer who is being seen 10/10/2023 for the evaluation of SVT, refractory at the request of Dr. Karene Fry.    Assessment & Plan    SVT History of paroxysmal atrial flutter -He has a history of atrial flutter in the past and was given amiodarone and anticoagulation at a prior hospital consult but currently is not on anticoagulation -CHADS2VASC score is 3 -Presented with SVT at a rate of 167 bpm not responsive to adenosine but then cardioverted with 120 J shock to sinus rhythm lasting 30 seconds.  Given IV Cardizem and converted spontaneously to sinus rhythm -Remains in NSR this am -UDS negative -2D echo 08/31/2021 showed EF 60 to 65% with mild LVH and G1 DD -2D echo this admission normal EF 55 to 60% and normal diastolic function with no focal wall motion abnormalities -TSH normal at 1.22  -Avoid beta-blocker in the setting  of acute wheezing -Will continue Cardizem CD 180 mg daily -Will get back in with EP for further evaluation of his SVT with history of atrial flutter.   -Currently patient has not been taking anticoagulation and since there is no evidence of a flutter on this admission we will not restart it at this time until he is seen by EP  Exertional chest pain Shortness of  breath Chronic diastolic CHF CAP/acute bronchitis -BNP mildly elevated  but chest x-ray does not show edema and overall appears euvolemic  -Chest x-ray showed chronic bronchitic changes and improvement of previous left lung infiltrate -O2 sats 98% on RA -He is a poor historian and I cannot really get a for how long the shortness of breath has been.  He did recently have CAP/acute bronchitis and cannot tell if his chest pain and shortness of breath are from residual infection.  Yesterday he told me that he only gets the pain after he eats and hurts to swallow across his chest but the day prior told me he has exertional CP so not consistent at all with is history. -Today he tells me that he gets so SOB when walking in the hall and still gets CP -2D echo this admission demonstrates EF 55 to 60% with normal diastolic function and RAP estimated 15 mmHg -hs trop normal x 2 -he had coronary artery calcifications on Chest CT 08/2021, continues to smoke, has HTN and DM2 so multiple CRFs -will go ahead and get his coronary CTA done while hospitalized since he is such a poor historian and I cannot tease out his sx enough to feel confident that his SOB is related to his PNA -will give Lopressor 100mg  prior to CTA   For questions or updates, please contact [facility] Please consult [email or web address] for contact info under        Signed, Armanda Magic, MD  10/13/2023, 9:05 AM

## 2023-10-13 NOTE — Plan of Care (Signed)
  Problem: Fluid Volume: Goal: Ability to maintain a balanced intake and output will improve Outcome: Progressing   Problem: Clinical Measurements: Goal: Respiratory complications will improve Outcome: Progressing Goal: Cardiovascular complication will be avoided Outcome: Progressing   Problem: Activity: Goal: Risk for activity intolerance will decrease Outcome: Progressing   Problem: Pain Managment: Goal: General experience of comfort will improve and/or be controlled Outcome: Progressing   Problem: Safety: Goal: Ability to remain free from injury will improve Outcome: Progressing

## 2023-10-13 NOTE — Progress Notes (Signed)
 Coronary CTA completed and showed minimal nonobstructive CAD of LAD and Cor cal score of 32.5.    No further inpt workup.  [facility] will sign off.   Medication Recommendations:  Cardizem CD 180mg  daily Other recommendations (labs, testing, etc):  Fasting lipid panel Follow up as an outpatient:  followup 4 weeks with extender in Cards clinic

## 2023-10-14 DIAGNOSIS — N179 Acute kidney failure, unspecified: Secondary | ICD-10-CM | POA: Diagnosis not present

## 2023-10-14 DIAGNOSIS — I471 Supraventricular tachycardia, unspecified: Secondary | ICD-10-CM | POA: Diagnosis not present

## 2023-10-14 DIAGNOSIS — J189 Pneumonia, unspecified organism: Secondary | ICD-10-CM | POA: Diagnosis not present

## 2023-10-14 LAB — GLUCOSE, CAPILLARY
Glucose-Capillary: 93 mg/dL (ref 70–99)
Glucose-Capillary: 131 mg/dL — ABNORMAL HIGH (ref 70–99)
Glucose-Capillary: 104 mg/dL — ABNORMAL HIGH (ref 70–99)

## 2023-10-14 MED ORDER — ENOXAPARIN SODIUM 40 MG/0.4ML IJ SOSY
40.0000 mg | PREFILLED_SYRINGE | INTRAMUSCULAR | Status: DC
  Administered 2023-10-14 – 2023-10-27 (×12): 40 mg via SUBCUTANEOUS
  Filled 2023-10-14 (×14): qty 0.4

## 2023-10-14 MED ORDER — TRAZODONE HCL 50 MG PO TABS
300.0000 mg | ORAL_TABLET | Freq: Every day | ORAL | Status: DC
  Administered 2023-10-14 – 2023-10-27 (×13): 300 mg via ORAL
  Filled 2023-10-14 (×15): qty 6

## 2023-10-14 MED ORDER — SERTRALINE HCL 25 MG PO TABS
50.0000 mg | ORAL_TABLET | Freq: Every day | ORAL | Status: DC
  Administered 2023-10-14 – 2023-10-24 (×11): 50 mg via ORAL
  Filled 2023-10-14 (×11): qty 2

## 2023-10-14 NOTE — Progress Notes (Signed)
 Physical Therapy Treatment Patient Details Name: Todd [name]. MRN: [id number] DOB: [birth year] Today's Date: 10/14/2023   History of Present Illness 68 y.o. male presents to [facility] hospital on 10/10/2023 with SVT. Pt was recently diagnosed with CAP on 10/08/23. PMH includes PAF, HTN, chronic diastolic HF.    PT Comments  Patient resting in bed with and moderate encouragement pt agreeable to mobilize with therapy. Pt verbose with information regarding Lt knee pain and limitations but able to sit up EOB with use of bed features and supervision for safety. Once EOB elevated pt required Mod assist to rise to Rollator, brakes not working and Information systems manager as well for safety. Pt stood for 5<>10 seconds prior to returning to EOB. With EOB lowered pt able to complete lateral scooting with good use of bil UE's and CGA for safety. Following return to supine pt able to use head board for pull up to reposition in bed and move into partial chair position. Discussed knee pain history and use of compression sleeve for pain, pt has old sleeve that is too large. Ordered bil soft compression sleeves for pt and will plan to use of pain management with mobility in future visits. Pt motivated to progress walking and anticipate good progress with ambulation as pain controlled and progress with wheelchair mobility based on lateral scoot transfers today. Patient will benefit from intensive inpatient follow-up therapy, >3 hours/day. Will continue to progress pt as able.    If plan is discharge home, recommend the following: A lot of help with walking and/or transfers;A lot of help with bathing/dressing/bathroom;Assistance with cooking/housework;Assist for transportation;Help with stairs or ramp for entrance;Direct supervision/assist for medications management   Can travel by private vehicle     Yes  Equipment Recommendations  Wheelchair (measurements PT);Wheelchair cushion (measurements PT)    Recommendations  for Other Services       Precautions / Restrictions Precautions Precautions: Fall Recall of Precautions/Restrictions: Intact Precaution/Restrictions Comments: L arthritic knee Restrictions Weight Bearing Restrictions Per Provider Order: No     Mobility  Bed Mobility Overal bed mobility: Needs Assistance Bed Mobility: Supine to Sit, Sit to Supine     Supine to sit: Supervision, HOB elevated, Used rails Sit to supine: Supervision, HOB elevated, Used rails   General bed mobility comments: pt taking extra time and effort.    Transfers Overall transfer level: Needs assistance Equipment used: Rollator (4 wheels) Transfers: Sit to/from Stand Sit to Stand: Mod assist, From elevated surface          Lateral/Scoot Transfers: Contact guard assist General transfer comment: rollator brakes not effective, therapist locking/blocking from movement. Mod assist ot fully power up from Elevated EOB. EOB lowered and pt able to compelte 4x lateral scoot towards HOB to reposition prior to return to supine. CGA for safety.    Ambulation/Gait                   Stairs             Wheelchair Mobility     Tilt Bed    Modified Rankin (Stroke Patients Only)       Balance Overall balance assessment: Needs assistance Sitting-balance support: No upper extremity supported, Feet supported Sitting balance-Leahy Scale: Good     Standing balance support: Bilateral upper extremity supported, Reliant on assistive device for balance Standing balance-Leahy Scale: Poor  Communication Communication Communication: No apparent difficulties  Cognition Arousal: Alert Behavior During Therapy: WFL for tasks assessed/performed   PT - Cognitive impairments: Safety/Judgement, Awareness, Attention                       PT - Cognition Comments: pt loquacious and slightly irriatble. redirectable with affirmations of pt's awareness (even  though it is limited) and with jokes. Following commands: Intact      Cueing Cueing Techniques: Verbal cues  Exercises      General Comments        Pertinent Vitals/Pain Pain Assessment Pain Assessment: Faces Faces Pain Scale: Hurts even more Pain Location: L knee Pain Descriptors / Indicators: Discomfort, Grimacing, Guarding Pain Intervention(s): Limited activity within patient's tolerance, Monitored during session, Repositioned    Home Living                          Prior Function            PT Goals (current goals can now be found in the care plan section) Acute Rehab PT Goals Patient Stated Goal: to reduce L knee pain, improve strength and stop falling PT Goal Formulation: With patient Time For Goal Achievement: 10/25/23 Potential to Achieve Goals: Fair Progress towards PT goals: Progressing toward goals    Frequency    Min 1X/week      PT Plan      Co-evaluation              AM-PAC PT "6 Clicks" Mobility   Outcome Measure  Help needed turning from your back to your side while in a flat bed without using bedrails?: A Little Help needed moving from lying on your back to sitting on the side of a flat bed without using bedrails?: A Little Help needed moving to and from a bed to a chair (including a wheelchair)?: A Lot Help needed standing up from a chair using your arms (e.g., wheelchair or bedside chair)?: A Lot Help needed to walk in hospital room?: Total Help needed climbing 3-5 steps with a railing? : Total 6 Click Score: 12    End of Session Equipment Utilized During Treatment: Gait belt Activity Tolerance: Patient limited by pain Patient left: in bed;with call bell/phone within reach;with family/visitor present;with bed alarm set Nurse Communication: Mobility status PT Visit Diagnosis: Other abnormalities of gait and mobility (R26.89);Muscle weakness (generalized) (M62.81);History of falling (Z91.81)     Time: 1447-1510 PT  Time Calculation (min) (ACUTE ONLY): 23 min  Charges:    $Therapeutic Activity: 23-37 mins PT General Charges $$ ACUTE PT VISIT: 1 Visit                     Wynn Maudlin, DPT Acute Rehabilitation Services Office [phone]  10/14/23 3:30 PM

## 2023-10-14 NOTE — Progress Notes (Signed)
 Orthopedic Tech Progress Note Patient Details:  Todd [name] Sep 05, 1955 [id number]  Ortho Devices Type of Ortho Device: Knee Sleeve Ortho Device/Splint Location: bilateral Ortho Device/Splint Interventions: Ordered, Application, Adjustment   Post Interventions Patient Tolerated: Well Instructions Provided: Care of device  Tonye Pearson 10/14/2023, 3:46 PM

## 2023-10-14 NOTE — Plan of Care (Signed)

## 2023-10-14 NOTE — Progress Notes (Signed)
 Progress Note    Todd [name].   JXB:[id number]  DOB: 17-Mar-1956  DOA: 10/10/2023     1 PCP: Blima Singer, PA  Initial CC: SVT  Hospital Course: 68 year old M with PMH of diastolic CHF, DM-2, HTN, PE not on anticoagulation, recent pneumonia/bronchitis, anxiety, depression and osteoarthritis brought to ED by EMS from doctor's office due to SVT with HR to 170s on preoperative evaluation for knee replacement.  Patient was given 6 mg adenosine by EMS and converted to NSR with HR to 88.  However, heart rate elevated to 166 on arrival to ED. He remained in SVT despite additional 6 mg of adenosine and attempted electrocardioversion in ED. cardiology consulted.  He was started on Cardizem drip.  Echocardiogram ordered.    The next day, he converted to normal sinus rhythm.  Transition to p.o. Cardizem CD 180 mg.  He was cleared for discharge by cardiology with outpatient Zio patch and EP follow-up. However, patient is physically deconditioned partly due to his severe osteoarthritis.  Therapy recommended SNF.  A&P:  SVT Hx atrial flutter - Not on nodal blocking agent.  Not on anticoagulation either.  SVT was refractory to 2 doses of adenosine but resolved after IV Cardizem drip.  BNP was elevated to 812.  X-ray without vascular congestion or edema.  TSH is 1.22.  -Appreciate recs by cardiology - Cardizem CD 180 mg daily - discontinue home amlodipine - 2-week outpatient Zio patch - EP follow-up in 2 weeks - Encourage alcohol cessation - Optimize electrolytes   Chronic diastolic CHF:  - Endorses shortness of breath but appears euvolemic on exam.  CXR did not suggest acute CHF.  BNP elevated to 812 which could be due to SVT.  TTE in 08/2021 with LVEF of 60 to 65%, mild LVH, G1DD.  -Repeat echo showing EF 55 to 60%, no RWMA, normal diastolic parameters - coronary CTA perormed 2/27: minimal nonobstructive CAD; no further workup rec'd per cardiology    Community-acquired pneumonia/acute  bronchitis: - Recently started on doxycycline and prednisone for this.  CXR with chronic bronchitic changes and probable improvement of previous left lung infiltrate.  He is COVID-19, influenza and RSV PCR was negative on 2/22 - RVP negative  - s/p Rocephin and doxycycline   AKI: Baseline Cr about 1.0.  Resolving.   Macrocytic anemia/vitamin B12 deficiency:  - Noted about 3 g drop in Hgb.  Patient denies melena or hematochezia.  He is not on anticoagulation or antiplatelet but seems to take naproxen.  Received about 1.2 L fluid in ED yesterday.  B12 low at 149.  Likely from alcohol use. -Continue monitoring.  Will check Hemoccult if hemoglobin drops further -B12 injection 1000 mcg x 2 followed by p.o. -Start p.o. Protonix -SCD for VTE prophylaxis until H&H is stable    Essential hypertension: Normotensive -P.o. Cardizem as above   NIDDM-2 with hyperglycemia: A1c 5.8%.  Was 8.8% in 2019.  Not on meds at home. -Continue SSI-very sensitive   Chronic pain Osteoarthritis Ambulatory dysfunction - Previously on opiates but has not filled any opiate since 07/06/2023 per narcotic database.  Started on Percocet this admission.  -Continue Tylenol and oxycodone as needed. -PT/OT-recommended SNF -Outpatient follow-up with orthopedic surgery to continue knee replacement workup   Dysphagia-reports oropharyngeal dysphagia -SLP eval -Continue dysphagia level 3; advanced to regular diet per patient request to try   Tobacco use disorder-reports smoking cigarette but does not want to quantify.  Not interested in nicotine patch -Encouraged smoking cessation  Alcohol abuse: Reports drinking about a quart.  Last drink the day before admission.  Denies major withdrawal symptoms. -Counseled. -Multivitamin, thiamine and folic acid -Consult TOC   Body mass index is 29.53 kg/m.  Interval History:  No events overnight. Little frustrated this morning but usually does better after he vents some.     Old records reviewed in assessment of this patient  Antimicrobials: Rocephin 10/10/2023 >> 2/26 Doxycycline 10/10/2023 >> 2/27  DVT prophylaxis:  enoxaparin (LOVENOX) injection 40 mg Start: 10/14/23 2200 SCDs Start: 10/10/23 2001   Code Status:   Code Status: Full Code  Mobility Assessment (Last 72 Hours)     Mobility Assessment     Row Name 10/13/23 2007 10/13/23 1431 10/13/23 0849 10/12/23 2111 10/12/23 1100   Does patient have an order for bedrest or is patient medically unstable No - Continue assessment -- No - Continue assessment No - Continue assessment No - Continue assessment   What is the highest level of mobility based on the progressive mobility assessment? Level 3 (Stands with assist) - Balance while standing  and cannot march in place Level 3 (Stands with assist) - Balance while standing  and cannot march in place Level 3 (Stands with assist) - Balance while standing  and cannot march in place Level 3 (Stands with assist) - Balance while standing  and cannot march in place Level 3 (Stands with assist) - Balance while standing  and cannot march in place   Is the above level different from baseline mobility prior to current illness? Yes - Recommend PT order -- Yes - Recommend PT order Yes - Recommend PT order Yes - Recommend PT order    Row Name 10/11/23 2132           Does patient have an order for bedrest or is patient medically unstable No - Continue assessment       What is the highest level of mobility based on the progressive mobility assessment? Level 3 (Stands with assist) - Balance while standing  and cannot march in place       Is the above level different from baseline mobility prior to current illness? Yes - Recommend PT order                Barriers to discharge: none Disposition Plan: SNF HH orders placed:  Status is: Obs  Objective: Blood pressure 114/71, pulse 72, temperature 98 F (36.7 C), resp. rate 19, height 5\' 9"  (1.753 m), weight 96  kg, SpO2 100%.  Examination:  Physical Exam Constitutional:      General: He is not in acute distress.    Appearance: Normal appearance.  HENT:     Head: Normocephalic and atraumatic.     Mouth/Throat:     Mouth: Mucous membranes are moist.  Eyes:     Extraocular Movements: Extraocular movements intact.  Cardiovascular:     Rate and Rhythm: Normal rate and regular rhythm.  Pulmonary:     Effort: Pulmonary effort is normal. No respiratory distress.     Breath sounds: Normal breath sounds. No wheezing.  Abdominal:     General: Bowel sounds are normal. There is no distension.     Palpations: Abdomen is soft.     Tenderness: There is no abdominal tenderness.  Musculoskeletal:        General: Normal range of motion.     Cervical back: Normal range of motion and neck supple.  Skin:    General: Skin is warm and dry.  Neurological:     General: No focal deficit present.     Mental Status: He is alert.  Psychiatric:        Mood and Affect: Mood normal.        Behavior: Behavior normal.      Consultants:    Procedures:    Data Reviewed: Results for orders placed or performed during the hospital encounter of 10/10/23 (from the past 24 hours)  Glucose, capillary     Status: None   Collection Time: 10/13/23  5:26 PM  Result Value Ref Range   Glucose-Capillary 97 70 - 99 mg/dL  Glucose, capillary     Status: Abnormal   Collection Time: 10/13/23  9:39 PM  Result Value Ref Range   Glucose-Capillary 110 (H) 70 - 99 mg/dL  Glucose, capillary     Status: None   Collection Time: 10/14/23  8:58 AM  Result Value Ref Range   Glucose-Capillary 93 70 - 99 mg/dL   Comment 1 Notify RN   Glucose, capillary     Status: Abnormal   Collection Time: 10/14/23 12:10 PM  Result Value Ref Range   Glucose-Capillary 131 (H) 70 - 99 mg/dL    I have reviewed pertinent nursing notes, vitals, labs, and images as necessary. I have ordered labwork to follow up on as indicated.  I have reviewed the  last notes from staff over past 24 hours. I have discussed patient's care plan and test results with nursing staff, CM/SW, and other staff as appropriate.    LOS: 1 day   Lewie Chamber, MD [facility] 10/14/2023, 2:35 PM

## 2023-10-14 NOTE — Progress Notes (Signed)
  Inpatient Rehabilitation Admissions Coordinator   Noted pursuing AIR rehab in other venues. He is not a candidate for [facility] . Also note he does not want to return to his current home. Family assisting in finding him another apartment per [facility] on 2/25.   Ottie Glazier, RN, MSN Rehab Admissions Coordinator [phone] 10/14/2023 5:28 PM

## 2023-10-14 NOTE — Progress Notes (Signed)
 PT Cancellation Note  Patient Details Name: Todd [name]. MRN: [id number] DOB: May 10, 1956   Cancelled Treatment:    Reason Eval/Treat Not Completed: Patient declined, no reason specified  Verbally aggressive to staff immediately after introducing myself. He is angered that I interrupted his breakfast. Would not allow therapist to speak. Will schedule an alternative PT in the future for attempts.   Kathlyn Sacramento, PT, DPT [facility]  Rehabilitation Services Physical Therapist Office: [phone] Website: [email or web address]   Berton Mount 10/14/2023, 10:43 AM

## 2023-10-15 DIAGNOSIS — J189 Pneumonia, unspecified organism: Secondary | ICD-10-CM | POA: Diagnosis not present

## 2023-10-15 DIAGNOSIS — N179 Acute kidney failure, unspecified: Secondary | ICD-10-CM | POA: Diagnosis not present

## 2023-10-15 DIAGNOSIS — I471 Supraventricular tachycardia, unspecified: Secondary | ICD-10-CM | POA: Diagnosis not present

## 2023-10-15 LAB — GLUCOSE, CAPILLARY
Glucose-Capillary: 126 mg/dL — ABNORMAL HIGH (ref 70–99)
Glucose-Capillary: 106 mg/dL — ABNORMAL HIGH (ref 70–99)
Glucose-Capillary: 99 mg/dL (ref 70–99)

## 2023-10-15 NOTE — Plan of Care (Signed)

## 2023-10-15 NOTE — Progress Notes (Signed)
 Progress Note    [name].   ZOX:[id number]  DOB: 12/17/1955  DOA: 10/10/2023     1 PCP: Blima Singer, PA  Initial CC: SVT  Hospital Course: 68 year old M with PMH of diastolic CHF, DM-2, HTN, PE not on anticoagulation, recent pneumonia/bronchitis, anxiety, depression and osteoarthritis brought to ED by EMS from doctor's office due to SVT with HR to 170s on preoperative evaluation for knee replacement.  Patient was given 6 mg adenosine by EMS and converted to NSR with HR to 88.  However, heart rate elevated to 166 on arrival to ED. He remained in SVT despite additional 6 mg of adenosine and attempted electrocardioversion in ED. cardiology consulted.  He was started on Cardizem drip.  Echocardiogram ordered.    The next day, he converted to normal sinus rhythm.  Transition to p.o. Cardizem CD 180 mg.  He was cleared for discharge by cardiology with outpatient Zio patch and EP follow-up. However, patient is physically deconditioned partly due to his severe osteoarthritis.  Therapy recommended SNF.  A&P:  SVT Hx atrial flutter - Not on nodal blocking agent.  Not on anticoagulation either.  SVT was refractory to 2 doses of adenosine but resolved after IV Cardizem drip.  BNP was elevated to 812.  X-ray without vascular congestion or edema.  TSH is 1.22.  -Appreciate recs by cardiology - Cardizem CD 180 mg daily - discontinue home amlodipine - 2-week outpatient Zio patch - EP follow-up in 2 weeks - Encourage alcohol cessation - Optimize electrolytes   Chronic diastolic CHF:  - Endorses shortness of breath but appears euvolemic on exam.  CXR did not suggest acute CHF.  BNP elevated to 812 which could be due to SVT.  TTE in 08/2021 with LVEF of 60 to 65%, mild LVH, G1DD.  -Repeat echo showing EF 55 to 60%, no RWMA, normal diastolic parameters - coronary CTA perormed 2/27: minimal nonobstructive CAD; no further workup rec'd per cardiology    Community-acquired pneumonia/acute  bronchitis: - Recently started on doxycycline and prednisone for this.  CXR with chronic bronchitic changes and probable improvement of previous left lung infiltrate.  He is COVID-19, influenza and RSV PCR was negative on 2/22 - RVP negative  - s/p Rocephin and doxycycline   AKI: Baseline Cr about 1.0.  Resolving.   Macrocytic anemia/vitamin B12 deficiency:  - Noted about 3 g drop in Hgb.  Patient denies melena or hematochezia.  He is not on anticoagulation or antiplatelet but seems to take naproxen.  Received about 1.2 L fluid in ED yesterday.  B12 low at 149.  Likely from alcohol use. -Continue monitoring.  Will check Hemoccult if hemoglobin drops further -B12 injection 1000 mcg x 2 followed by p.o. -Start p.o. Protonix -SCD for VTE prophylaxis until H&H is stable    Essential hypertension: Normotensive -P.o. Cardizem as above   NIDDM-2 with hyperglycemia: A1c 5.8%.  Was 8.8% in 2019.  Not on meds at home. -Continue SSI-very sensitive   Chronic pain Osteoarthritis Ambulatory dysfunction - Previously on opiates but has not filled any opiate since 07/06/2023 per narcotic database.  Started on Percocet this admission.  -Continue Tylenol and oxycodone as needed. -PT/OT-recommended SNF -Outpatient follow-up with orthopedic surgery to continue knee replacement workup   Dysphagia-reports oropharyngeal dysphagia -SLP eval -Continue dysphagia level 3; advanced to regular diet per patient request to try   Tobacco use disorder-reports smoking cigarette but does not want to quantify.  Not interested in nicotine patch -Encouraged smoking cessation  Alcohol abuse: Reports drinking about a quart.  Last drink the day before admission.  Denies major withdrawal symptoms. -Counseled. -Multivitamin, thiamine and folic acid -Consult TOC   Body mass index is 29.53 kg/m.  Interval History:  No events overnight.  Eating lunch when seen today.  Had no concerns or questions.  Plans on discussing  SNF options with his family.    Old records reviewed in assessment of this patient  Antimicrobials: Rocephin 10/10/2023 >> 2/26 Doxycycline 10/10/2023 >> 2/27  DVT prophylaxis:  enoxaparin (LOVENOX) injection 40 mg Start: 10/14/23 2200 SCDs Start: 10/10/23 2001   Code Status:   Code Status: Full Code  Mobility Assessment (Last 72 Hours)     Mobility Assessment     Row Name 10/15/23 13:20:38 10/15/23 0930 10/15/23 0023 10/14/23 1521 10/14/23 1030   Does patient have an order for bedrest or is patient medically unstable No - Continue assessment No - Continue assessment No - Continue assessment -- No - Continue assessment   What is the highest level of mobility based on the progressive mobility assessment? Level 3 (Stands with assist) - Balance while standing  and cannot march in place Level 3 (Stands with assist) - Balance while standing  and cannot march in place Level 3 (Stands with assist) - Balance while standing  and cannot march in place Level 3 (Stands with assist) - Balance while standing  and cannot march in place Level 2 (Chairfast) - Balance while sitting on edge of bed and cannot stand   Is the above level different from baseline mobility prior to current illness? Yes - Recommend PT order Yes - Recommend PT order Yes - Recommend PT order -- Yes - Recommend PT order    Row Name 10/13/23 2007 10/13/23 1431 10/13/23 0849 10/12/23 2111     Does patient have an order for bedrest or is patient medically unstable No - Continue assessment -- No - Continue assessment No - Continue assessment    What is the highest level of mobility based on the progressive mobility assessment? Level 3 (Stands with assist) - Balance while standing  and cannot march in place Level 3 (Stands with assist) - Balance while standing  and cannot march in place Level 3 (Stands with assist) - Balance while standing  and cannot march in place Level 3 (Stands with assist) - Balance while standing  and cannot march in  place    Is the above level different from baseline mobility prior to current illness? Yes - Recommend PT order -- Yes - Recommend PT order Yes - Recommend PT order             Barriers to discharge: none Disposition Plan: SNF HH orders placed:  Status is: Obs  Objective: Blood pressure 103/69, pulse 70, temperature 98.7 F (37.1 C), temperature source Oral, resp. rate 18, height 5\' 9"  (1.753 m), weight 96 kg, SpO2 94%.  Examination:  Physical Exam Constitutional:      General: He is not in acute distress.    Appearance: Normal appearance.  HENT:     Head: Normocephalic and atraumatic.     Mouth/Throat:     Mouth: Mucous membranes are moist.  Eyes:     Extraocular Movements: Extraocular movements intact.  Cardiovascular:     Rate and Rhythm: Normal rate and regular rhythm.  Pulmonary:     Effort: Pulmonary effort is normal. No respiratory distress.     Breath sounds: Normal breath sounds. No wheezing.  Abdominal:  General: Bowel sounds are normal. There is no distension.     Palpations: Abdomen is soft.     Tenderness: There is no abdominal tenderness.  Musculoskeletal:        General: Normal range of motion.     Cervical back: Normal range of motion and neck supple.  Skin:    General: Skin is warm and dry.  Neurological:     General: No focal deficit present.     Mental Status: He is alert.  Psychiatric:        Mood and Affect: Mood normal.        Behavior: Behavior normal.      Consultants:    Procedures:    Data Reviewed: Results for orders placed or performed during the hospital encounter of 10/10/23 (from the past 24 hours)  Glucose, capillary     Status: Abnormal   Collection Time: 10/14/23  5:36 PM  Result Value Ref Range   Glucose-Capillary 104 (H) 70 - 99 mg/dL  Glucose, capillary     Status: Abnormal   Collection Time: 10/15/23  8:07 AM  Result Value Ref Range   Glucose-Capillary 126 (H) 70 - 99 mg/dL  Glucose, capillary     Status:  Abnormal   Collection Time: 10/15/23 11:47 AM  Result Value Ref Range   Glucose-Capillary 106 (H) 70 - 99 mg/dL   Comment 1 Notify RN    Comment 2 Document in Chart     I have reviewed pertinent nursing notes, vitals, labs, and images as necessary. I have ordered labwork to follow up on as indicated.  I have reviewed the last notes from staff over past 24 hours. I have discussed patient's care plan and test results with nursing staff, CM/SW, and other staff as appropriate.    LOS: 1 day   Lewie Chamber, MD [facility] 10/15/2023, 3:11 PM

## 2023-10-16 DIAGNOSIS — J189 Pneumonia, unspecified organism: Secondary | ICD-10-CM | POA: Diagnosis not present

## 2023-10-16 DIAGNOSIS — I471 Supraventricular tachycardia, unspecified: Secondary | ICD-10-CM | POA: Diagnosis not present

## 2023-10-16 DIAGNOSIS — N179 Acute kidney failure, unspecified: Secondary | ICD-10-CM | POA: Diagnosis not present

## 2023-10-16 LAB — GLUCOSE, CAPILLARY
Glucose-Capillary: 103 mg/dL — ABNORMAL HIGH (ref 70–99)
Glucose-Capillary: 93 mg/dL (ref 70–99)
Glucose-Capillary: 102 mg/dL — ABNORMAL HIGH (ref 70–99)
Glucose-Capillary: 110 mg/dL — ABNORMAL HIGH (ref 70–99)

## 2023-10-16 NOTE — Plan of Care (Signed)

## 2023-10-16 NOTE — Progress Notes (Signed)
 Progress Note    Todd [name].   GMW:[id number]  DOB: 1956-01-05  DOA: 10/10/2023     1 PCP: Blima Singer, PA  Initial CC: SVT  Hospital Course: 68 year old M with PMH of diastolic CHF, DM-2, HTN, PE not on anticoagulation, recent pneumonia/bronchitis, anxiety, depression and osteoarthritis brought to ED by EMS from doctor's office due to SVT with HR to 170s on preoperative evaluation for knee replacement.  Patient was given 6 mg adenosine by EMS and converted to NSR with HR to 88.  However, heart rate elevated to 166 on arrival to ED. He remained in SVT despite additional 6 mg of adenosine and attempted electrocardioversion in ED. cardiology consulted.  He was started on Cardizem drip.  Echocardiogram ordered.    The next day, he converted to normal sinus rhythm.  Transition to p.o. Cardizem CD 180 mg.  He was cleared for discharge by cardiology with outpatient Zio patch and EP follow-up. However, patient is physically deconditioned partly due to his severe osteoarthritis.  Therapy recommended SNF.  A&P:  SVT Hx atrial flutter - Not on nodal blocking agent.  Not on anticoagulation either.  SVT was refractory to 2 doses of adenosine but resolved after IV Cardizem drip.  BNP was elevated to 812.  X-ray without vascular congestion or edema.  TSH is 1.22.  -Appreciate recs by cardiology - Cardizem CD 180 mg daily - discontinue home amlodipine - 2-week outpatient Zio patch - EP follow-up in 2 weeks - Encourage alcohol cessation - Optimize electrolytes   Chronic diastolic CHF:  - Endorses shortness of breath but appears euvolemic on exam.  CXR did not suggest acute CHF.  BNP elevated to 812 which could be due to SVT.  TTE in 08/2021 with LVEF of 60 to 65%, mild LVH, G1DD.  -Repeat echo showing EF 55 to 60%, no RWMA, normal diastolic parameters - coronary CTA perormed 2/27: minimal nonobstructive CAD; no further workup rec'd per cardiology    Community-acquired pneumonia/acute  bronchitis: - Recently started on doxycycline and prednisone for this.  CXR with chronic bronchitic changes and probable improvement of previous left lung infiltrate.  He is COVID-19, influenza and RSV PCR was negative on 2/22 - RVP negative  - s/p Rocephin and doxycycline   AKI: Baseline Cr about 1.0.  Resolving   Macrocytic anemia/vitamin B12 deficiency:  - Noted about 3 g drop in Hgb.  Patient denies melena or hematochezia.  He is not on anticoagulation or antiplatelet but seems to take naproxen.  Received about 1.2 L fluid in ED yesterday.  B12 low at 149.  Likely from alcohol use. -Continue monitoring.  Will check Hemoccult if hemoglobin drops further -B12 injection 1000 mcg x 2 followed by p.o. -Start p.o. Protonix -SCD for VTE prophylaxis until H&H is stable    Essential hypertension: Normotensive -P.o. Cardizem as above   NIDDM-2 with hyperglycemia: A1c 5.8%.  Was 8.8% in 2019.  Not on meds at home. -Continue SSI-very sensitive   Chronic pain Osteoarthritis Ambulatory dysfunction - Previously on opiates but has not filled any opiate since 07/06/2023 per narcotic database.  Started on Percocet this admission.  -Continue Tylenol and oxycodone as needed. -PT/OT-recommended SNF -Outpatient follow-up with orthopedic surgery to continue knee replacement workup   Dysphagia-reports oropharyngeal dysphagia -SLP eval -Continue dysphagia level 3; advanced to regular diet per patient request to try   Tobacco use disorder-reports smoking cigarette but does not want to quantify.  Not interested in nicotine patch -Encouraged smoking cessation  Alcohol abuse: Reports drinking about a quart.  Last drink the day before admission.  Denies major withdrawal symptoms. -Counseled. -Multivitamin, thiamine and folic acid -Consult TOC   Body mass index is 29.53 kg/m.  Interval History:  No events overnight.  Sleeping soundly when seen this morning.  Medically stable and awaiting placement  at this time.    Old records reviewed in assessment of this patient  Antimicrobials: Rocephin 10/10/2023 >> 2/26 Doxycycline 10/10/2023 >> 2/27  DVT prophylaxis:  enoxaparin (LOVENOX) injection 40 mg Start: 10/14/23 2200 SCDs Start: 10/10/23 2001   Code Status:   Code Status: Full Code  Mobility Assessment (Last 72 Hours)     Mobility Assessment     Row Name 10/16/23 0826 10/15/23 2036 10/15/23 13:20:38 10/15/23 0930 10/15/23 0023   Does patient have an order for bedrest or is patient medically unstable No - Continue assessment No - Continue assessment No - Continue assessment No - Continue assessment No - Continue assessment   What is the highest level of mobility based on the progressive mobility assessment? Level 3 (Stands with assist) - Balance while standing  and cannot march in place Level 3 (Stands with assist) - Balance while standing  and cannot march in place Level 3 (Stands with assist) - Balance while standing  and cannot march in place Level 3 (Stands with assist) - Balance while standing  and cannot march in place Level 3 (Stands with assist) - Balance while standing  and cannot march in place   Is the above level different from baseline mobility prior to current illness? Yes - Recommend PT order Yes - Recommend PT order Yes - Recommend PT order Yes - Recommend PT order Yes - Recommend PT order    Row Name 10/14/23 1521 10/14/23 1030 10/13/23 2007 10/13/23 1431     Does patient have an order for bedrest or is patient medically unstable -- No - Continue assessment No - Continue assessment --    What is the highest level of mobility based on the progressive mobility assessment? Level 3 (Stands with assist) - Balance while standing  and cannot march in place Level 2 (Chairfast) - Balance while sitting on edge of bed and cannot stand Level 3 (Stands with assist) - Balance while standing  and cannot march in place Level 3 (Stands with assist) - Balance while standing  and cannot  march in place    Is the above level different from baseline mobility prior to current illness? -- Yes - Recommend PT order Yes - Recommend PT order --             Barriers to discharge: none Disposition Plan: SNF HH orders placed:  Status is: Obs  Objective: Blood pressure 132/84, pulse 60, temperature 99.2 F (37.3 C), resp. rate 18, height 5\' 9"  (1.753 m), weight 96 kg, SpO2 94%.  Examination:  Physical Exam Constitutional:      General: He is not in acute distress.    Appearance: Normal appearance.  HENT:     Head: Normocephalic and atraumatic.     Mouth/Throat:     Mouth: Mucous membranes are moist.  Eyes:     Extraocular Movements: Extraocular movements intact.  Cardiovascular:     Rate and Rhythm: Normal rate and regular rhythm.  Pulmonary:     Effort: Pulmonary effort is normal. No respiratory distress.     Breath sounds: Normal breath sounds. No wheezing.  Abdominal:     General: Bowel sounds are normal. There  is no distension.     Palpations: Abdomen is soft.     Tenderness: There is no abdominal tenderness.  Musculoskeletal:        General: Normal range of motion.     Cervical back: Normal range of motion and neck supple.  Skin:    General: Skin is warm and dry.  Neurological:     General: No focal deficit present.     Mental Status: He is alert.  Psychiatric:        Mood and Affect: Mood normal.        Behavior: Behavior normal.      Consultants:    Procedures:    Data Reviewed: Results for orders placed or performed during the hospital encounter of 10/10/23 (from the past 24 hours)  Glucose, capillary     Status: None   Collection Time: 10/15/23  4:09 PM  Result Value Ref Range   Glucose-Capillary 99 70 - 99 mg/dL   Comment 1 Notify RN    Comment 2 Document in Chart   Glucose, capillary     Status: Abnormal   Collection Time: 10/16/23  7:48 AM  Result Value Ref Range   Glucose-Capillary 103 (H) 70 - 99 mg/dL  Glucose, capillary      Status: None   Collection Time: 10/16/23 12:12 PM  Result Value Ref Range   Glucose-Capillary 93 70 - 99 mg/dL    I have reviewed pertinent nursing notes, vitals, labs, and images as necessary. I have ordered labwork to follow up on as indicated.  I have reviewed the last notes from staff over past 24 hours. I have discussed patient's care plan and test results with nursing staff, CM/SW, and other staff as appropriate.    LOS: 1 day   Lewie Chamber, MD [facility] 10/16/2023, 2:12 PM

## 2023-10-17 DIAGNOSIS — J189 Pneumonia, unspecified organism: Secondary | ICD-10-CM | POA: Diagnosis not present

## 2023-10-17 DIAGNOSIS — I471 Supraventricular tachycardia, unspecified: Secondary | ICD-10-CM | POA: Diagnosis not present

## 2023-10-17 DIAGNOSIS — N179 Acute kidney failure, unspecified: Secondary | ICD-10-CM | POA: Diagnosis not present

## 2023-10-17 LAB — GLUCOSE, CAPILLARY
Glucose-Capillary: 96 mg/dL (ref 70–99)
Glucose-Capillary: 104 mg/dL — ABNORMAL HIGH (ref 70–99)
Glucose-Capillary: 100 mg/dL — ABNORMAL HIGH (ref 70–99)

## 2023-10-17 NOTE — TOC Progression Note (Addendum)
 Transition of Care (TOC) - Progression Note    Patient Details  Name: Todd [name]. MRN: [id number] Date of Birth: Apr 12, 1956  Transition of [facility]) CM/SW Contact  Tomoki Lucken A [place], LCSW Phone Number: 10/17/2023, 1:21 PM  Clinical Narrative:     CSW met with pt at bedside to inform him of update to his disposition, as acute inpatient rehab declined authorization request, SNF would need to be pursued. He said that he was agreeable to SNF at [facility] health care for short term rehab.   CSW followed up with Archie Patten, liaison, she said that she wanted her director to review again and do in person clinical assessment to confirm pt still has bed offer.   CSW also sent clinicals to [facility] to assist with pt's long term ALF placement in the future.    TOC will continue to follow.        Expected Discharge Plan and Services                                               Social Determinants of Health (SDOH) Interventions SDOH Screenings   Food Insecurity: Food Insecurity Present (10/11/2023)  Housing: High Risk (10/11/2023)  Transportation Needs: Unmet Transportation Needs (10/11/2023)  Utilities: Not At Risk (10/11/2023)  Alcohol Screen: Low Risk  (04/20/2023)  Financial Resource Strain: Low Risk  (10/27/2022)   Received from [facility]  Physical Activity: Inactive (10/27/2022)   Received from [facility]  Social Connections: Socially Isolated (10/11/2023)  Stress: Stress Concern Present (10/27/2022)   Received from [facility]  Tobacco Use: High Risk (10/10/2023)  Health Literacy: Low Risk  (10/27/2022)   Received from [facility]    Readmission Risk Interventions    10/11/2023    3:09 PM 09/02/2021    1:12 PM  Readmission Risk Prevention Plan  Transportation Screening Complete Complete  PCP or Specialist Appt within 3-5 Days Complete   HRI or Home Care Consult Complete Complete  Social Work Consult for Recovery Care Planning/Counseling Complete  Complete  Palliative Care Screening Complete Not Applicable  Medication Review Oceanographer) Complete

## 2023-10-17 NOTE — TOC Progression Note (Signed)
 Transition of Care (TOC) - Progression Note    Patient Details  Name: Todd [name]. MRN: [id number] Date of Birth: April 30, 1956  Transition of Care [facility]) CM/SW Contact  Janae Bridgeman, RN Phone Number: 10/17/2023, 8:48 AM  Clinical Narrative:    CM spoke with Maryagnes Amos, CM with [facility] and [facility] has offered a Peer to Peer by 11:15 today - number to call was provided to attending physician to complete if he is able this morning.        Expected Discharge Plan and Services                                               Social Determinants of Health (SDOH) Interventions SDOH Screenings   Food Insecurity: Food Insecurity Present (10/11/2023)  Housing: High Risk (10/11/2023)  Transportation Needs: Unmet Transportation Needs (10/11/2023)  Utilities: Not At Risk (10/11/2023)  Alcohol Screen: Low Risk  (04/20/2023)  Financial Resource Strain: Low Risk  (10/27/2022)   Received [facility]  Physical Activity: Inactive (10/27/2022)   Received from [facility]  Social Connections: Socially Isolated (10/11/2023)  Stress: Stress Concern Present (10/27/2022)   Received from [facility]  Tobacco Use: High Risk (10/10/2023)  Health Literacy: Low Risk  (10/27/2022)   Received from [facility]    Readmission Risk Interventions    10/11/2023    3:09 PM 09/02/2021    1:12 PM  Readmission Risk Prevention Plan  Transportation Screening Complete Complete  PCP or Specialist Appt within 3-5 Days Complete   HRI or Home Care Consult Complete Complete  Social Work Consult for Recovery Care Planning/Counseling Complete Complete  Palliative Care Screening Complete Not Applicable  Medication Review Oceanographer) Complete

## 2023-10-17 NOTE — Progress Notes (Signed)
 Physical Therapy Treatment Patient Details Name: Todd [name]. MRN: [id number] DOB: 08-04-1956 Today's Date: 10/17/2023   History of Present Illness 68 y.o. male admitted 10/10/2023 with SVT. Pt diagnosed with CAP 10/08/23. PMHx: PAF, HTN, chronic diastolic HF, bipolar disorder, RBBB, BPPV, MDD, substance abuse, DM    PT Comments  Pt agreeable to attempt mobility as he had ordered breakfast but not arrived yet. Pt continues to state knee pain limiting function but states knee sleeves have made a positive impact with both donned prior to attempting standing. Pt able to progress to short distance gait and reports he walked to bathroom with staff. Pt denied performing HEP or further transfers limited by fatigue/pain. Will continue to follow.     If plan is discharge home, recommend the following: A lot of help with walking and/or transfers;A lot of help with bathing/dressing/bathroom;Assistance with cooking/housework;Assist for transportation;Help with stairs or ramp for entrance;Direct supervision/assist for medications management   Can travel by private vehicle     Yes  Equipment Recommendations  Wheelchair (measurements PT);Wheelchair cushion (measurements PT)    Recommendations for Other Services       Precautions / Restrictions Precautions Precautions: Fall Recall of Precautions/Restrictions: Impaired Precaution/Restrictions Comments: knee pain     Mobility  Bed Mobility   Bed Mobility: Supine to Sit           General bed mobility comments: pt getting to EOB impulsively despite cues to don knee braces first    Transfers Overall transfer level: Needs assistance Equipment used: Rollator (4 wheels) Transfers: Sit to/from Stand Sit to Stand: Mod assist, From elevated surface, +2 safety/equipment           General transfer comment: pt continues to demand use of rollator despite broken brakes, wheel held steady with therapist foot. pt fearful of therapist hands not on  him with standing despite attempting to cover pt and correct position of gait belt. Increased time to fully extend hips and trunk. Bil knee sleeves donned in sitting prior to stand    Ambulation/Gait Ambulation/Gait assistance: Min assist, +2 safety/equipment Gait Distance (Feet): 5 Feet Assistive device: Rollator (4 wheels) Gait Pattern/deviations: Step-through pattern, Decreased stride length, Trunk flexed   Gait velocity interpretation: <1.31 ft/sec, indicative of household ambulator   General Gait Details: min cues for sequence with rollator and close chair follow. pt reports pain and fatigue and unable to tolerate further activity   Stairs             Wheelchair Mobility     Tilt Bed    Modified Rankin (Stroke Patients Only)       Balance Overall balance assessment: Needs assistance Sitting-balance support: No upper extremity supported, Feet supported Sitting balance-Leahy Scale: Good     Standing balance support: Bilateral upper extremity supported, Reliant on assistive device for balance Standing balance-Leahy Scale: Poor Standing balance comment: rollator and physical assist in standing                            Communication Communication Communication: No apparent difficulties  Cognition Arousal: Alert Behavior During Therapy: WFL for tasks assessed/performed   PT - Cognitive impairments: Safety/Judgement, Awareness, Attention, Problem solving                       PT - Cognition Comments: pt can be impulsive and flip between pleasant and easily irritable, pt with self-limiting behavior and not tolerating increased mobility attempts  Following commands: Impaired Following commands impaired: Follows multi-step commands inconsistently    Cueing Cueing Techniques: Verbal cues, Tactile cues  Exercises      General Comments        Pertinent Vitals/Pain Pain Assessment Pain Score: 4  Pain Location: bil knees Pain Descriptors  / Indicators: Discomfort, Grimacing, Guarding, Sore Pain Intervention(s): Limited activity within patient's tolerance, Monitored during session, RN gave pain meds during session, Repositioned    Home Living                          Prior Function            PT Goals (current goals can now be found in the care plan section) Progress towards PT goals: Progressing toward goals    Frequency    Min 1X/week      PT Plan      Co-evaluation              AM-PAC PT "6 Clicks" Mobility   Outcome Measure  Help needed turning from your back to your side while in a flat bed without using bedrails?: A Little Help needed moving from lying on your back to sitting on the side of a flat bed without using bedrails?: A Little Help needed moving to and from a bed to a chair (including a wheelchair)?: A Lot Help needed standing up from a chair using your arms (e.g., wheelchair or bedside chair)?: A Lot Help needed to walk in hospital room?: Total Help needed climbing 3-5 steps with a railing? : Total 6 Click Score: 12    End of Session Equipment Utilized During Treatment: Gait belt Activity Tolerance: Patient limited by pain Patient left: in chair;with call bell/phone within reach;with chair alarm set;with nursing/sitter in room Nurse Communication: Mobility status PT Visit Diagnosis: Other abnormalities of gait and mobility (R26.89);Muscle weakness (generalized) (M62.81);History of falling (Z91.81)     Time: 3086-5784 PT Time Calculation (min) (ACUTE ONLY): 15 min  Charges:    $Therapeutic Activity: 8-22 mins PT General Charges $$ ACUTE PT VISIT: 1 Visit                     Merryl Hacker, PT Acute Rehabilitation Services Office: [phone]    Enedina Finner Emillio Ngo 10/17/2023, 10:27 AM

## 2023-10-17 NOTE — Progress Notes (Signed)
 Progress Note    Todd [name].   ZOX:[id number]  DOB: June 24, 1956  DOA: 10/10/2023     0 PCP: Todd Singer, PA  Initial CC: SVT  Hospital Course: 68 year old M with PMH of diastolic CHF, DM-2, HTN, PE not on anticoagulation, recent pneumonia/bronchitis, anxiety, depression and osteoarthritis brought to ED by EMS from doctor's office due to SVT with HR to 170s on preoperative evaluation for knee replacement.  Patient was given 6 mg adenosine by EMS and converted to NSR with HR to 88.  However, heart rate elevated to 166 on arrival to ED. He remained in SVT despite additional 6 mg of adenosine and attempted electrocardioversion in ED. cardiology consulted.  He was started on Cardizem drip.  Echocardiogram ordered.    The next day, he converted to normal sinus rhythm.  Transition to p.o. Cardizem CD 180 mg.  He was cleared for discharge by cardiology with outpatient Zio patch and EP follow-up. However, patient is physically deconditioned partly due to his severe osteoarthritis.  Therapy recommended SNF.  A&P:  SVT Hx atrial flutter - Not on nodal blocking agent.  Not on anticoagulation either.  SVT was refractory to 2 doses of adenosine but resolved after IV Cardizem drip.  BNP was elevated to 812.  X-ray without vascular congestion or edema.  TSH is 1.22.  -Appreciate recs by cardiology - Cardizem CD 180 mg daily - discontinue home amlodipine - 2-week outpatient Zio patch - EP follow-up in 2 weeks - Encourage alcohol cessation - Optimize electrolytes   Chronic diastolic CHF:  - Endorses shortness of breath but appears euvolemic on exam.  CXR did not suggest acute CHF.  BNP elevated to 812 which could be due to SVT.  TTE in 08/2021 with LVEF of 60 to 65%, mild LVH, G1DD.  -Repeat echo showing EF 55 to 60%, no RWMA, normal diastolic parameters - coronary CTA perormed 2/27: minimal nonobstructive CAD; no further workup rec'd per cardiology    Community-acquired pneumonia/acute  bronchitis: - Recently started on doxycycline and prednisone for this.  CXR with chronic bronchitic changes and probable improvement of previous left lung infiltrate.  He is COVID-19, influenza and RSV PCR was negative on 2/22 - RVP negative  - s/p Rocephin and doxycycline   AKI: Baseline Cr about 1.0.  Resolving   Macrocytic anemia/vitamin B12 deficiency:  - Noted about 3 g drop in Hgb.  Patient denies melena or hematochezia.  He is not on anticoagulation or antiplatelet but seems to take naproxen.  Received about 1.2 L fluid in ED yesterday.  B12 low at 149.  Likely from alcohol use. -Continue monitoring.  Will check Hemoccult if hemoglobin drops further -B12 injection 1000 mcg x 2 followed by p.o. -Start p.o. Protonix -SCD for VTE prophylaxis until H&H is stable    Essential hypertension: Normotensive -P.o. Cardizem as above   NIDDM-2 with hyperglycemia: A1c 5.8%.  Was 8.8% in 2019.  Not on meds at home. -Continue SSI-very sensitive   Chronic pain Osteoarthritis Ambulatory dysfunction - Previously on opiates but has not filled any opiate since 07/06/2023 per narcotic database.  Started on Percocet this admission.  -Continue Tylenol and oxycodone as needed. -PT/OT-recommended SNF -Outpatient follow-up with orthopedic surgery to continue knee replacement workup   Dysphagia-reports oropharyngeal dysphagia -SLP eval -Continue dysphagia level 3; advanced to regular diet per patient request to try   Tobacco use disorder-reports smoking cigarette but does not want to quantify.  Not interested in nicotine patch -Encouraged smoking cessation  Alcohol abuse: Reports drinking about a quart.  Last drink the day before admission.  Denies major withdrawal symptoms. -Counseled. -Multivitamin, thiamine and folic acid -Consult TOC   Body mass index is 29.53 kg/m.  Interval History:  Discussed case via P2P this morning; declined for CIR. Not considered an ideal candidate for >3 hrs  therapy daily. Discussed this with him and also recommended he consider short term rehab as well.  He also plans to get in touch with Ortho to reschedule his appointment.   Old records reviewed in assessment of this patient  Antimicrobials: Rocephin 10/10/2023 >> 2/26 Doxycycline 10/10/2023 >> 2/27  DVT prophylaxis:  enoxaparin (LOVENOX) injection 40 mg Start: 10/14/23 2200 SCDs Start: 10/10/23 2001   Code Status:   Code Status: Full Code  Mobility Assessment (Last 72 Hours)     Mobility Assessment     Row Name 10/17/23 1026 10/17/23 0840 10/16/23 2143 10/16/23 2142 10/16/23 0826   Does patient have an order for bedrest or is patient medically unstable -- No - Continue assessment No - Continue assessment No - Continue assessment No - Continue assessment   What is the highest level of mobility based on the progressive mobility assessment? Level 3 (Stands with assist) - Balance while standing  and cannot march in place Level 3 (Stands with assist) - Balance while standing  and cannot march in place Level 3 (Stands with assist) - Balance while standing  and cannot march in place Level 3 (Stands with assist) - Balance while standing  and cannot march in place Level 3 (Stands with assist) - Balance while standing  and cannot march in place   Is the above level different from baseline mobility prior to current illness? -- Yes - Recommend PT order Yes - Recommend PT order Yes - Recommend PT order Yes - Recommend PT order    Row Name 10/15/23 2036 10/15/23 13:20:38 10/15/23 0930 10/15/23 0023 10/14/23 1521   Does patient have an order for bedrest or is patient medically unstable No - Continue assessment No - Continue assessment No - Continue assessment No - Continue assessment --   What is the highest level of mobility based on the progressive mobility assessment? Level 3 (Stands with assist) - Balance while standing  and cannot march in place Level 3 (Stands with assist) - Balance while standing   and cannot march in place Level 3 (Stands with assist) - Balance while standing  and cannot march in place Level 3 (Stands with assist) - Balance while standing  and cannot march in place Level 3 (Stands with assist) - Balance while standing  and cannot march in place   Is the above level different from baseline mobility prior to current illness? Yes - Recommend PT order Yes - Recommend PT order Yes - Recommend PT order Yes - Recommend PT order --            Barriers to discharge: none Disposition Plan: SNF HH orders placed:  Status is: Obs  Objective: Blood pressure 115/75, pulse 63, temperature 98.7 F (37.1 C), temperature source Oral, resp. rate 16, height 5\' 9"  (1.753 m), weight 92.7 kg, SpO2 92%.  Examination:  Physical Exam Constitutional:      General: He is not in acute distress.    Appearance: Normal appearance.  HENT:     Head: Normocephalic and atraumatic.     Mouth/Throat:     Mouth: Mucous membranes are moist.  Eyes:     Extraocular Movements: Extraocular movements  intact.  Cardiovascular:     Rate and Rhythm: Normal rate and regular rhythm.  Pulmonary:     Effort: Pulmonary effort is normal. No respiratory distress.     Breath sounds: Normal breath sounds. No wheezing.  Abdominal:     General: Bowel sounds are normal. There is no distension.     Palpations: Abdomen is soft.     Tenderness: There is no abdominal tenderness.  Musculoskeletal:        General: Normal range of motion.     Cervical back: Normal range of motion and neck supple.  Skin:    General: Skin is warm and dry.  Neurological:     General: No focal deficit present.     Mental Status: He is alert.  Psychiatric:        Mood and Affect: Mood normal.        Behavior: Behavior normal.      Consultants:    Procedures:    Data Reviewed: Results for orders placed or performed during the hospital encounter of 10/10/23 (from the past 24 hours)  Glucose, capillary     Status: Abnormal    Collection Time: 10/16/23  4:50 PM  Result Value Ref Range   Glucose-Capillary 102 (H) 70 - 99 mg/dL  Glucose, capillary     Status: Abnormal   Collection Time: 10/16/23  8:02 PM  Result Value Ref Range   Glucose-Capillary 110 (H) 70 - 99 mg/dL  Glucose, capillary     Status: None   Collection Time: 10/17/23  8:24 AM  Result Value Ref Range   Glucose-Capillary 96 70 - 99 mg/dL  Glucose, capillary     Status: Abnormal   Collection Time: 10/17/23  8:32 AM  Result Value Ref Range   Glucose-Capillary 104 (H) 70 - 99 mg/dL    I have reviewed pertinent nursing notes, vitals, labs, and images as necessary. I have ordered labwork to follow up on as indicated.  I have reviewed the last notes from staff over past 24 hours. I have discussed patient's care plan and test results with nursing staff, CM/SW, and other staff as appropriate.    LOS: 0 days   Lewie Chamber, MD [facility] 10/17/2023, 1:12 PM

## 2023-10-18 DIAGNOSIS — N179 Acute kidney failure, unspecified: Secondary | ICD-10-CM | POA: Diagnosis not present

## 2023-10-18 DIAGNOSIS — I471 Supraventricular tachycardia, unspecified: Secondary | ICD-10-CM | POA: Diagnosis not present

## 2023-10-18 LAB — GLUCOSE, CAPILLARY
Glucose-Capillary: 81 mg/dL (ref 70–99)
Glucose-Capillary: 86 mg/dL (ref 70–99)
Glucose-Capillary: 136 mg/dL — ABNORMAL HIGH (ref 70–99)
Glucose-Capillary: 107 mg/dL — ABNORMAL HIGH (ref 70–99)

## 2023-10-18 NOTE — Plan of Care (Signed)

## 2023-10-18 NOTE — Progress Notes (Signed)
 Progress Note    [name].   LKG:[id number]  DOB: October 29, 1955  DOA: 10/10/2023     0 PCP: Blima Singer, PA  Initial CC: SVT  Hospital Course: 68 year old M with PMH of diastolic CHF, DM-2, HTN, PE not on anticoagulation, recent pneumonia/bronchitis, anxiety, depression and osteoarthritis brought to ED by EMS from doctor's office due to SVT with HR to 170s on preoperative evaluation for knee replacement.  Patient was given 6 mg adenosine by EMS and converted to NSR with HR to 88.  However, heart rate elevated to 166 on arrival to ED. He remained in SVT despite additional 6 mg of adenosine and attempted electrocardioversion in ED. cardiology consulted.  He was started on Cardizem drip.  Echocardiogram ordered.    The next day, he converted to normal sinus rhythm.  Transition to p.o. Cardizem CD 180 mg.  He was cleared for discharge by cardiology with outpatient Zio patch and EP follow-up. However, patient is physically deconditioned partly due to his severe osteoarthritis.  Therapy recommended SNF.  A&P:  SVT Hx atrial flutter - Not on nodal blocking agent.  Not on anticoagulation either.  SVT was refractory to 2 doses of adenosine but resolved after IV Cardizem drip.  BNP was elevated to 812.  X-ray without vascular congestion or edema.  TSH is 1.22.  -Appreciate recs by cardiology - Cardizem CD 180 mg daily - discontinue home amlodipine - 2-week outpatient Zio patch - EP follow-up in 2 weeks - Encourage alcohol cessation - Optimized electrolytes   Chronic diastolic CHF:  - Endorses shortness of breath but appears euvolemic on exam.  CXR did not suggest acute CHF.  BNP elevated to 812 which could be due to SVT.  TTE in 08/2021 with LVEF of 60 to 65%, mild LVH, G1DD.  -Repeat echo showing EF 55 to 60%, no RWMA, normal diastolic parameters - coronary CTA perormed 2/27: minimal nonobstructive CAD; no further workup rec'd per cardiology    Community-acquired pneumonia/acute  bronchitis: - Recently started on doxycycline and prednisone for this.  CXR with chronic bronchitic changes and probable improvement of previous left lung infiltrate.  He is COVID-19, influenza and RSV PCR was negative on 2/22 - RVP negative  - s/p Rocephin and doxycycline   AKI: Baseline Cr about 1.0.  Resolving   Macrocytic anemia/vitamin B12 deficiency:  - Noted about 3 g drop in Hgb.  Patient denies melena or hematochezia.  He is not on anticoagulation or antiplatelet but seems to take naproxen.  Received about 1.2 L fluid in ED yesterday.  B12 low at 149.  Likely from alcohol use. -Continue monitoring.  Will check Hemoccult if hemoglobin drops further -B12 injection 1000 mcg x 2 followed by p.o. -Start p.o. Protonix -SCD for VTE prophylaxis until H&H is stable    Essential hypertension: Normotensive -P.o. Cardizem as above   NIDDM-2 with hyperglycemia: A1c 5.8%.  Was 8.8% in 2019.  Not on meds at home. -Continue SSI-very sensitive   Chronic pain Osteoarthritis Ambulatory dysfunction - Previously on opiates but has not filled any opiate since 07/06/2023 per narcotic database.  Started on Percocet this admission.  -Continue Tylenol and oxycodone as needed. -PT/OT-recommended SNF -Outpatient follow-up with orthopedic surgery to continue knee replacement workup   Dysphagia-reports oropharyngeal dysphagia -SLP eval -Continue dysphagia level 3; advanced to regular diet per patient request to try   Tobacco use disorder-reports smoking cigarette but does not want to quantify.  Not interested in nicotine patch -Encouraged smoking cessation  Alcohol abuse: Reports drinking about a quart.  Last drink the day before admission.  Denies major withdrawal symptoms. -Counseled. -Multivitamin, thiamine and folic acid -Consult TOC   Body mass index is 29.53 kg/m.  Interval History:  No evidence overnight.  Ambulated some with therapy today. Stable for discharge to [facility]  healthcare.   Old records reviewed in assessment of this patient  Antimicrobials: Rocephin 10/10/2023 >> 2/26 Doxycycline 10/10/2023 >> 2/27  DVT prophylaxis:  enoxaparin (LOVENOX) injection 40 mg Start: 10/14/23 2200 SCDs Start: 10/10/23 2001   Code Status:   Code Status: Full Code  Mobility Assessment (Last 72 Hours)     Mobility Assessment     Row Name 10/18/23 1400 10/18/23 1248 10/17/23 2025 10/17/23 1026 10/17/23 0840   Does patient have an order for bedrest or is patient medically unstable No - Continue assessment -- No - Continue assessment -- No - Continue assessment   What is the highest level of mobility based on the progressive mobility assessment? Level 4 (Walks with assist in room) - Balance while marching in place and cannot step forward and back - Complete Level 4 (Walks with assist in room) - Balance while marching in place and cannot step forward and back - Complete Level 3 (Stands with assist) - Balance while standing  and cannot march in place Level 3 (Stands with assist) - Balance while standing  and cannot march in place Level 3 (Stands with assist) - Balance while standing  and cannot march in place   Is the above level different from baseline mobility prior to current illness? Yes - Recommend PT order -- Yes - Recommend PT order -- Yes - Recommend PT order    Row Name 10/16/23 2143 10/16/23 2142 10/16/23 0826 10/15/23 2036     Does patient have an order for bedrest or is patient medically unstable No - Continue assessment No - Continue assessment No - Continue assessment No - Continue assessment    What is the highest level of mobility based on the progressive mobility assessment? Level 3 (Stands with assist) - Balance while standing  and cannot march in place Level 3 (Stands with assist) - Balance while standing  and cannot march in place Level 3 (Stands with assist) - Balance while standing  and cannot march in place Level 3 (Stands with assist) - Balance while  standing  and cannot march in place    Is the above level different from baseline mobility prior to current illness? Yes - Recommend PT order Yes - Recommend PT order Yes - Recommend PT order Yes - Recommend PT order             Barriers to discharge: none Disposition Plan: SNF HH orders placed:  Status is: Obs  Objective: Blood pressure 128/70, pulse 60, temperature 98 F (36.7 C), temperature source Oral, resp. rate 17, height 5\' 9"  (1.753 m), weight 94 kg, SpO2 97%.  Examination:  Physical Exam Constitutional:      General: He is not in acute distress.    Appearance: Normal appearance.  HENT:     Head: Normocephalic and atraumatic.     Mouth/Throat:     Mouth: Mucous membranes are moist.  Eyes:     Extraocular Movements: Extraocular movements intact.  Cardiovascular:     Rate and Rhythm: Normal rate and regular rhythm.  Pulmonary:     Effort: Pulmonary effort is normal. No respiratory distress.     Breath sounds: Normal breath sounds. No wheezing.  Abdominal:     General: Bowel sounds are normal. There is no distension.     Palpations: Abdomen is soft.     Tenderness: There is no abdominal tenderness.  Musculoskeletal:        General: Normal range of motion.     Cervical back: Normal range of motion and neck supple.  Skin:    General: Skin is warm and dry.  Neurological:     General: No focal deficit present.     Mental Status: He is alert.  Psychiatric:        Mood and Affect: Mood normal.        Behavior: Behavior normal.      Consultants:    Procedures:    Data Reviewed: Results for orders placed or performed during the hospital encounter of 10/10/23 (from the past 24 hours)  Glucose, capillary     Status: Abnormal   Collection Time: 10/17/23  8:52 PM  Result Value Ref Range   Glucose-Capillary 100 (H) 70 - 99 mg/dL  Glucose, capillary     Status: None   Collection Time: 10/18/23  7:47 AM  Result Value Ref Range   Glucose-Capillary 81 70 - 99  mg/dL  Glucose, capillary     Status: None   Collection Time: 10/18/23 11:27 AM  Result Value Ref Range   Glucose-Capillary 86 70 - 99 mg/dL    I have reviewed pertinent nursing notes, vitals, labs, and images as necessary. I have ordered labwork to follow up on as indicated.  I have reviewed the last notes from staff over past 24 hours. I have discussed patient's care plan and test results with nursing staff, CM/SW, and other staff as appropriate.    LOS: 0 days   Lewie Chamber, MD [facility] 10/18/2023, 3:08 PM

## 2023-10-18 NOTE — Progress Notes (Signed)
 Occupational Therapy Treatment Patient Details Name: Todd [name]. MRN: [id number] DOB: 1956/05/01 Today's Date: 10/18/2023   History of present illness 68 y.o. male admitted 10/10/2023 with SVT. Pt diagnosed with CAP 10/08/23. PMHx: PAF, HTN, chronic diastolic HF, bipolar disorder, RBBB, BPPV, MDD, substance abuse, DM   OT comments  Patient continues to struggle with B knee pain, but puts forth effort to keep moving.  Mod A for ambulation to bathroom with 4WRW.  Max A with ADL, now transitioning form seated to sit to stand, and able to stand sinkside for grooming task.  Slowly progressing with ADL status and mobility.  OT will continue efforts in the acute setting and Patient will benefit from intensive inpatient follow-up therapy, >3 hours/day.       If plan is discharge home, recommend the following:  A lot of help with walking and/or transfers;A lot of help with bathing/dressing/bathroom;Assist for transportation;Assistance with cooking/housework;Help with stairs or ramp for entrance   Equipment Recommendations  Tub/shower bench    Recommendations for Other Services      Precautions / Restrictions Precautions Precautions: Fall Recall of Precautions/Restrictions: Impaired Precaution/Restrictions Comments: knee pain Restrictions Weight Bearing Restrictions Per Provider Order: No       Mobility Bed Mobility Overal bed mobility: Needs Assistance Bed Mobility: Supine to Sit, Sit to Supine     Supine to sit: Supervision, HOB elevated, Used rails Sit to supine: Min assist        Transfers Overall transfer level: Needs assistance Equipment used: Rollator (4 wheels) Transfers: Sit to/from Stand Sit to Stand: Mod assist                 Balance Overall balance assessment: Needs assistance Sitting-balance support: Feet supported Sitting balance-Leahy Scale: Good     Standing balance support: Reliant on assistive device for balance, Single extremity  supported Standing balance-Leahy Scale: Poor Standing balance comment: has no righting reactions to prevent fall backwards                           ADL either performed or assessed with clinical judgement   ADL       Grooming: Wash/dry hands;Contact guard assist;Standing Grooming Details (indicate cue type and reason): leans heavily on counter         Upper Body Dressing : Minimal assistance;Sitting   Lower Body Dressing: Maximal assistance;Sit to/from stand   Toilet Transfer: Moderate assistance;Ambulation;Regular Toilet;Rollator (4 wheels)                  Extremity/Trunk Assessment Upper Extremity Assessment Upper Extremity Assessment: Generalized weakness RUE Deficits / Details: decreased end range to shoulder flexion LUE Deficits / Details: decreased end range to shoulder flexion   Lower Extremity Assessment Lower Extremity Assessment: Defer to PT evaluation   Cervical / Trunk Assessment Cervical / Trunk Assessment: Kyphotic    Vision Baseline Vision/History: 1 Wears glasses Patient Visual Report: No change from baseline     Perception Perception Perception: Not tested   Praxis Praxis Praxis: Not tested   Communication Communication Communication: No apparent difficulties   Cognition Arousal: Alert Behavior During Therapy: WFL for tasks assessed/performed Cognition: No apparent impairments                               Following commands: Intact        Cueing      Exercises  Shoulder Instructions       General Comments      Pertinent Vitals/ Pain       Pain Assessment Pain Assessment: 0-10 Pain Score: 7  Pain Location: bil knees Pain Descriptors / Indicators: Discomfort, Grimacing, Guarding, Sore Pain Intervention(s): Monitored during session, Limited activity within patient's tolerance                                                          Frequency  Min 1X/week         Progress Toward Goals  OT Goals(current goals can now be found in the care plan section)  Progress towards OT goals: Progressing toward goals  Acute Rehab OT Goals OT Goal Formulation: With patient Time For Goal Achievement: 10/25/23 Potential to Achieve Goals: Fair  Plan      Co-evaluation                 AM-PAC OT "6 Clicks" Daily Activity     Outcome Measure   Help from another person eating meals?: None Help from another person taking care of personal grooming?: A Little Help from another person toileting, which includes using toliet, bedpan, or urinal?: A Lot Help from another person bathing (including washing, rinsing, drying)?: A Lot Help from another person to put on and taking off regular upper body clothing?: A Little Help from another person to put on and taking off regular lower body clothing?: A Lot 6 Click Score: 16    End of Session Equipment Utilized During Treatment: Rollator (4 wheels)  OT Visit Diagnosis: Unsteadiness on feet (R26.81);Other abnormalities of gait and mobility (R26.89);Muscle weakness (generalized) (M62.81);Pain Pain - Right/Left: Left Pain - part of body: Knee   Activity Tolerance Patient limited by pain   Patient Left in bed;with call bell/phone within reach   Nurse Communication Mobility status        Time: 1210-1233 OT Time Calculation (min): 23 min  Charges: OT General Charges $OT Visit: 1 Visit OT Treatments $Self Care/Home Management : 23-37 mins  10/18/2023  RP, OTR/L  Acute Rehabilitation Services  Office:  [phone]   Suzanna Obey 10/18/2023, 12:50 PM

## 2023-10-19 DIAGNOSIS — R079 Chest pain, unspecified: Secondary | ICD-10-CM | POA: Diagnosis not present

## 2023-10-19 DIAGNOSIS — N179 Acute kidney failure, unspecified: Secondary | ICD-10-CM | POA: Diagnosis not present

## 2023-10-19 DIAGNOSIS — J189 Pneumonia, unspecified organism: Secondary | ICD-10-CM | POA: Diagnosis not present

## 2023-10-19 DIAGNOSIS — I471 Supraventricular tachycardia, unspecified: Secondary | ICD-10-CM | POA: Diagnosis not present

## 2023-10-19 LAB — GLUCOSE, CAPILLARY
Glucose-Capillary: 209 mg/dL — ABNORMAL HIGH (ref 70–99)
Glucose-Capillary: 232 mg/dL — ABNORMAL HIGH (ref 70–99)
Glucose-Capillary: 121 mg/dL — ABNORMAL HIGH (ref 70–99)
Glucose-Capillary: 104 mg/dL — ABNORMAL HIGH (ref 70–99)

## 2023-10-19 MED ORDER — HYDROXYZINE HCL 25 MG PO TABS: 25.0000 mg | ORAL_TABLET | Freq: Three times a day (TID) | ORAL | Status: DC | PRN

## 2023-10-19 MED ORDER — OXYCODONE-ACETAMINOPHEN 5-325 MG PO TABS
1.0000 | ORAL_TABLET | Freq: Four times a day (QID) | ORAL | Status: DC | PRN
  Administered 2023-10-20 – 2023-10-28 (×14): 1 via ORAL
  Filled 2023-10-19 (×16): qty 1

## 2023-10-19 MED ORDER — ACETAMINOPHEN 325 MG PO TABS
650.0000 mg | ORAL_TABLET | Freq: Four times a day (QID) | ORAL | Status: DC
Start: 2023-10-19 — End: 2023-10-28
  Administered 2023-10-19 – 2023-10-27 (×26): 650 mg via ORAL
  Filled 2023-10-19 (×30): qty 2

## 2023-10-19 MED ORDER — LIDOCAINE 5 % EX PTCH
2.0000 | MEDICATED_PATCH | CUTANEOUS | Status: DC
  Administered 2023-10-21 – 2023-10-28 (×7): 2 via TRANSDERMAL
  Filled 2023-10-19 (×10): qty 2

## 2023-10-19 NOTE — Plan of Care (Signed)

## 2023-10-19 NOTE — TOC Progression Note (Signed)
 Transition of Care (TOC) - Progression Note    Patient Details  Name: Todd [name]. MRN: [id number] Date of Birth: 13-Dec-1955  Transition of Care [facility]) CM/SW Contact  Maela Takeda A [place], LCSW Phone Number: 10/19/2023, 3:22 PM  Clinical Narrative:     CSW was informed by [facility] care that pt's long term care placement needs to be solidified before pt can be accepted for short term rehab.   TOC team contacted Hydia at [facility], to follow up on referral that was sent for clinical review. Awaiting reply, no answer currently.   CSW faxed out pt for review to Trenton Gammon, for possible placement at one of her ALF facilities.  CSW also sent referral to Ty at [facility]. Waiting reply for review of referral.  Plan for short term rehab at SNF or placement at an ALF with home health services. Pt currently lives on boarding house and cannot return at this time as pt cannot navigate his stairs.   CSW to follow up with pt and update on possible placement options.    TOC will continue to follow.        Expected Discharge Plan and Services                                               Social Determinants of Health (SDOH) Interventions SDOH Screenings   Food Insecurity: Food Insecurity Present (10/11/2023)  Housing: High Risk (10/11/2023)  Transportation Needs: Unmet Transportation Needs (10/11/2023)  Utilities: Not At Risk (10/11/2023)  Alcohol Screen: Low Risk  (04/20/2023)  Financial Resource Strain: Low Risk  (10/27/2022)   Received from [facility]  Physical Activity: Inactive (10/27/2022)   Received from [facility]  Social Connections: Socially Isolated (10/11/2023)  Stress: Stress Concern Present (10/27/2022)   Received from [facility]  Tobacco Use: High Risk (10/10/2023)  Health Literacy: Low Risk  (10/27/2022)   Received from [facility]    Readmission Risk Interventions    10/11/2023    3:09 PM 09/02/2021    1:12 PM   Readmission Risk Prevention Plan  Transportation Screening Complete Complete  PCP or Specialist Appt within 3-5 Days Complete   HRI or Home Care Consult Complete Complete  Social Work Consult for Recovery Care Planning/Counseling Complete Complete  Palliative Care Screening Complete Not Applicable  Medication Review Oceanographer) Complete

## 2023-10-19 NOTE — Progress Notes (Signed)
 PROGRESS NOTE    Todd [name].  WUJ:[id number] DOB: 01/13/1956 DOA: 10/10/2023 PCP: Todd Singer, PA  Chief Complaint  Patient presents with   Tachycardia    [name] Course:  Todd [name]. is 68 y.o. male with heart failure, type 2 diabetes, hypertension, pulmonary embolisms not on anticoagulation, recent pneumonia, anxiety, depression osteoarthritis, who was brought to the ED from his doctor's office with SVT, heart rate in the 170s on arrival.  Patient has receiving a preoperative evaluation for knee replacement at the time of this visit.  He was given 6 mg of adenosine by EMS converted to NSR with a heart rate of 88.  On arrival to the ED heart rate elevated again to 166.  He remained in SVT despite an additional 6 mg of adenosine and then underwent attempted electrocardioversion in the ED.  Cardiology was consulted.  He was started on Cardizem drip.  Echocardiogram was ordered.  He converted back to NSR as able to be transitioned to Cardizem p.o.  He is medically stable to discharge with outpatient Zio patch and EP follow-up.  Unfortunately, the patient is very physically deconditioned in part due to his severe osteoarthritis and therapy has recommended SNF placement.  Subjective: No acute events overnight. On evaluation today patient reports he is still experiencing knee pain and cannot walk. Reports the 5mg  percocet is not enough. We discussed a multimodal chronic pain regimen, he is amendable to trying this.  Objective: Vitals:   10/18/23 1944 10/19/23 0500 10/19/23 0611 10/19/23 0805  BP: 120/75  112/80 99/64  Pulse: 67  (!) 56 65  Resp: 16  17 18   Temp: 98.2 F (36.8 C)  98.5 F (36.9 C) 97.8 F (36.6 C)  TempSrc: Oral     SpO2: 97%  95% 96%  Weight:  93.1 kg    Height:        Intake/Output Summary (Last 24 hours) at 10/19/2023 0856 Last data filed at 10/19/2023 0645 Gross per 24 hour  Intake --  Output 1300 ml  Net -1300 ml   Filed Weights   10/17/23  0525 10/18/23 0500 10/19/23 0500  Weight: 92.7 kg 94 kg 93.1 kg    Examination: General exam: Appears calm and comfortable, NAD  Respiratory system: No work of breathing, symmetric chest wall expansion Cardiovascular system: S1 & S2 heard, RRR.  Gastrointestinal system: Abdomen is nondistended, soft and nontender.  Neuro: Alert and oriented. No focal neurological deficits. Extremities: Symmetric, expected ROM Skin: No rashes, lesions Psychiatry: Demonstrates appropriate judgement and insight. Mood & affect appropriate for situation.   Assessment & Plan:  Principal Problem:   SVT (supraventricular tachycardia) (HCC) Active Problems:   Non-insulin dependent type 2 diabetes mellitus (HCC)   Primary hypertension   Chest pain of uncertain etiology   AKI (acute kidney injury) (HCC)   CAP (community acquired pneumonia)   Paroxysmal atrial flutter (HCC)   Hypomagnesemia   Macrocytic anemia   Chronic diastolic CHF (congestive heart failure) (HCC)   SOB (shortness of breath)   Supraventricular tachycardia (HCC)    SVT History of atrial flutter - Not on nodal blocking agent on arrival - Not on anticoagulation outpatient - SVT initially refractory to 2 doses of adenosine but did resolve after Cardizem drip. - Cardiology consulted, now stabilized on Cardizem CD1 80 daily - Discontinued home amlodipine - Needs 2 weeks outpatient Zio patch - EP follow-up in 2 weeks - Continue to optimize electrolytes - Has been counseled on alcohol cessation - TSH 1.2  Chronic diastolic CHF - BNP elevated to 812 on arrival - Clinically euvolemic on exam - Chest x-ray does not indicate acute volume overload - BNP elevation may be secondary to SVT - Echo EF 55 to 60%, no regional wall motion abnormalities, normal diastolic parameters - Coronary CTA performed 2/27 with minimally obstructive CAD.  No further workup recommended by cardiology  Community-acquired pneumonia - Chest x-ray with  bronchitis changes and some improvement of left lung infiltrate - Respiratory viral panel negative -- Strep pneumo neg. - Status post Rocephin and doxycycline  AKI - Baseline creatinine 1.0, near that now.  Macrocytic anemia B12 deficiency - Has had drop in hemoglobin.  Denies melena or hematochezia. - Not on anticoagulation/antiplatelets but does take naproxen. - B12 deficiency -- Suspect secondary to EtOH abuse - Status post B12 injection 1000 mcg x 2 followed by p.o. - Continue PPI - SCD for VTE prophylaxis until H&H is stable. Repeat CBC in AM  Hypertension - Normotensive - Continue Cardizem as above  Non-insulin-dependent type 2 diabetes with hyperglycemia - Hemoglobin A1c 5.8% - No need for insulin at this time  Chronic pain Osteoarthritis Ambulatory dysfunction - Previously on opiates outpatient but has not filled any since July 06, 2023 per PDMP - Is currently on Percocet, discussed with him this is not appropriate for long term pain control. -- Initiate better multimodal pain control today. Scheduled tylenol, lidocaine patches. Will change Zoloft to Cymbalta given better long term pain control. Have requested PharmD to review zoloft compliance outpt to better plan taper. - PT/OT recommending SNF - Needs to keep outpatient follow-up with orthopedic surgery for knee replacement workup  Dysphagia - Oropharyngeal dysphagia.  SLP eval ordered -Was on dysphasia diet previously, appears this has been advanced now.  Tobacco use disorder - Patient refuses nicotine patch at this time - Has been counseled on smoking cessation  EtOH abuse - Last drink 2/23 - Has been monitored for withdrawal, now out of withdrawal window - Multivitamin, thiamine, folic acid - TOC consulted for resource assistance  BMI 29 - Outpatient follow up for lifestyle modification and risk factor management   DVT prophylaxis: Lovenox   Code Status: Full Code Family Communication:   Discussed directly with patient Disposition:  Inpatient still hospitalized for dispo planning and pain medication titration, will discharge to SNF when this has been arranged by Todd [name]  Consultants:    Procedures:    Antimicrobials:  Anti-infectives (From admission, onward)    Start     Dose/Rate Route Frequency Ordered Stop   10/10/23 2100  doxycycline (VIBRAMYCIN) 100 mg in sodium chloride 0.9 % 250 mL IVPB  Status:  Discontinued        100 mg 125 mL/hr over 120 Minutes Intravenous Every 12 hours 10/10/23 2045 10/13/23 1056   10/10/23 2100  cefTRIAXone (ROCEPHIN) 1 g in sodium chloride 0.9 % 100 mL IVPB  Status:  Discontinued        1 g 200 mL/hr over 30 Minutes Intravenous Every 24 hours 10/10/23 2045 10/13/23 1056       Data Reviewed: I have personally reviewed following labs and imaging studies CBC: No results for input(s): "WBC", "NEUTROABS", "HGB", "HCT", "MCV", "PLT" in the last 168 hours. Basic Metabolic Panel: No results for input(s): "NA", "K", "CL", "CO2", "GLUCOSE", "BUN", "CREATININE", "CALCIUM", "MG", "PHOS" in the last 168 hours. GFR: Estimated Creatinine Clearance: 74.1 mL/min (by C-G formula based on SCr of 1.09 mg/dL). Liver Function Tests: No results for input(s): "AST", "ALT", "ALKPHOS", "  BILITOT", "PROT", "ALBUMIN" in the last 168 hours. CBG: Recent Labs  Lab 10/18/23 0747 10/18/23 1127 10/18/23 1613 10/18/23 2118 10/19/23 0804  GLUCAP 81 86 136* 107* 209*    Recent Results (from the past 240 hours)  Respiratory (~20 pathogens) panel by PCR     Status: None   Collection Time: 10/11/23  3:03 PM   Specimen: Nasopharyngeal Swab; Respiratory  Result Value Ref Range Status   Adenovirus NOT DETECTED NOT DETECTED Final   Coronavirus 229E NOT DETECTED NOT DETECTED Final    Comment: (NOTE) The Coronavirus on the Respiratory Panel, DOES NOT test for the novel  Coronavirus (2019 nCoV)    Coronavirus HKU1 NOT DETECTED NOT DETECTED Final   Coronavirus NL63  NOT DETECTED NOT DETECTED Final   Coronavirus OC43 NOT DETECTED NOT DETECTED Final   Metapneumovirus NOT DETECTED NOT DETECTED Final   Rhinovirus / Enterovirus NOT DETECTED NOT DETECTED Final   Influenza A NOT DETECTED NOT DETECTED Final   Influenza B NOT DETECTED NOT DETECTED Final   Parainfluenza Virus 1 NOT DETECTED NOT DETECTED Final   Parainfluenza Virus 2 NOT DETECTED NOT DETECTED Final   Parainfluenza Virus 3 NOT DETECTED NOT DETECTED Final   Parainfluenza Virus 4 NOT DETECTED NOT DETECTED Final   Respiratory Syncytial Virus NOT DETECTED NOT DETECTED Final   Bordetella pertussis NOT DETECTED NOT DETECTED Final   Bordetella Parapertussis NOT DETECTED NOT DETECTED Final   Chlamydophila pneumoniae NOT DETECTED NOT DETECTED Final   Mycoplasma pneumoniae NOT DETECTED NOT DETECTED Final    Comment: Performed at [facility], [address], [place], [place] [address]     Radiology Studies: No results found.  Scheduled Meds:  vitamin B-12  1,000 mcg Oral Daily   diltiazem  180 mg Oral Daily   enoxaparin (LOVENOX) injection  40 mg Subcutaneous Q24H   folic acid  1 mg Oral Daily   insulin aspart  0-6 Units Subcutaneous TID WC   multivitamin with minerals  1 tablet Oral Daily   pantoprazole  40 mg Oral Daily   sertraline  50 mg Oral Daily   thiamine  100 mg Oral Daily   traZODone  300 mg Oral QHS   Continuous Infusions:   LOS: 0 days    Total time spent interpreting labs and vitals, coordinating care amongst consultants and care team members, directly assessing and discussing care with the patient and/or family: 46 min   Debarah Crape, DO [facility]  To contact the attending physician between 7A-7P please use Epic Chat. To contact the covering physician during after hours 7P-7A, please review Amion.   10/19/2023, 8:56 AM   *This document has been created with the assistance of dictation software. Please excuse typographical errors. *

## 2023-10-20 DIAGNOSIS — R079 Chest pain, unspecified: Secondary | ICD-10-CM | POA: Diagnosis not present

## 2023-10-20 DIAGNOSIS — R0602 Shortness of breath: Secondary | ICD-10-CM

## 2023-10-20 DIAGNOSIS — I471 Supraventricular tachycardia, unspecified: Secondary | ICD-10-CM | POA: Diagnosis not present

## 2023-10-20 DIAGNOSIS — N179 Acute kidney failure, unspecified: Secondary | ICD-10-CM | POA: Diagnosis not present

## 2023-10-20 DIAGNOSIS — J189 Pneumonia, unspecified organism: Secondary | ICD-10-CM | POA: Diagnosis not present

## 2023-10-20 LAB — CBC
HCT: 39.9 % (ref 39.0–52.0)
Hemoglobin: 12.9 g/dL — ABNORMAL LOW (ref 13.0–17.0)
MCH: 32.4 pg (ref 26.0–34.0)
MCHC: 32.3 g/dL (ref 30.0–36.0)
MCV: 100.3 fL — ABNORMAL HIGH (ref 80.0–100.0)
Platelets: 240 10*3/uL (ref 150–400)
RBC: 3.98 MIL/uL — ABNORMAL LOW (ref 4.22–5.81)
RDW: 15.7 % — ABNORMAL HIGH (ref 11.5–15.5)
WBC: 4.2 10*3/uL (ref 4.0–10.5)
nRBC: 0 % (ref 0.0–0.2)

## 2023-10-20 LAB — COMPREHENSIVE METABOLIC PANEL
ALT: 12 U/L (ref 0–44)
AST: 18 U/L (ref 15–41)
Albumin: 3.1 g/dL — ABNORMAL LOW (ref 3.5–5.0)
Alkaline Phosphatase: 47 U/L (ref 38–126)
Anion gap: 7 (ref 5–15)
BUN: 22 mg/dL (ref 8–23)
CO2: 26 mmol/L (ref 22–32)
Calcium: 9.5 mg/dL (ref 8.9–10.3)
Chloride: 105 mmol/L (ref 98–111)
Creatinine, Ser: 1.25 mg/dL — ABNORMAL HIGH (ref 0.61–1.24)
GFR, Estimated: >60 mL/min (ref >60)
Glucose, Bld: 109 mg/dL — ABNORMAL HIGH (ref 70–99)
Potassium: 5 mmol/L (ref 3.5–5.1)
Sodium: 138 mmol/L (ref 135–145)
Total Bilirubin: 0.6 mg/dL (ref 0.0–1.2)
Total Protein: 6.9 g/dL (ref 6.5–8.1)

## 2023-10-20 LAB — GLUCOSE, CAPILLARY
Glucose-Capillary: 91 mg/dL (ref 70–99)
Glucose-Capillary: 141 mg/dL — ABNORMAL HIGH (ref 70–99)
Glucose-Capillary: 116 mg/dL — ABNORMAL HIGH (ref 70–99)
Glucose-Capillary: 128 mg/dL — ABNORMAL HIGH (ref 70–99)

## 2023-10-20 NOTE — TOC Progression Note (Signed)
 Transition of Care (TOC) - Progression Note    Patient Details  Name: Todd [name]. MRN: [id number] Date of Birth: 1955-09-18  Transition of Care [facility]) CM/SW Contact  Maddisen Vought A [place], LCSW Phone Number: 10/20/2023, 4:58 PM  Clinical Narrative:     CSW spoke with Ty at [place], informed her that CSW had spoken with pt and is agreeable to meet to confirm if placement is viable. CSW establishing time for meeting at bedside.    TOC will continue to follow.        Expected Discharge Plan and Services                                               Social Determinants of Health (SDOH) Interventions SDOH Screenings   Food Insecurity: Food Insecurity Present (10/11/2023)  Housing: High Risk (10/11/2023)  Transportation Needs: Unmet Transportation Needs (10/11/2023)  Utilities: Not At Risk (10/11/2023)  Alcohol Screen: Low Risk  (04/20/2023)  Financial Resource Strain: Low Risk  (10/27/2022)   Received from [facility]  Physical Activity: Inactive (10/27/2022)   Received from [facility]  Social Connections: Socially Isolated (10/11/2023)  Stress: Stress Concern Present (10/27/2022)   Received from [facility]  Tobacco Use: High Risk (10/10/2023)  Health Literacy: Low Risk  (10/27/2022)   Received from [facility]    Readmission Risk Interventions    10/11/2023    3:09 PM 09/02/2021    1:12 PM  Readmission Risk Prevention Plan  Transportation Screening Complete Complete  PCP or Specialist Appt within 3-5 Days Complete   HRI or Home Care Consult Complete Complete  Social Work Consult for Recovery Care Planning/Counseling Complete Complete  Palliative Care Screening Complete Not Applicable  Medication Review Oceanographer) Complete

## 2023-10-20 NOTE — Plan of Care (Signed)
  Problem: Education: Goal: Ability to describe self-care measures that may prevent or decrease complications (Diabetes Survival Skills Education) will improve Outcome: Progressing   Problem: Coping: Goal: Ability to adjust to condition or change in health will improve Outcome: Progressing   Problem: Metabolic: Goal: Ability to maintain appropriate glucose levels will improve Outcome: Progressing   Problem: Nutritional: Goal: Maintenance of adequate nutrition will improve Outcome: Progressing

## 2023-10-20 NOTE — Progress Notes (Signed)
 Physical Therapy Treatment Patient Details Name: Todd [name]. MRN: [id number] DOB: 1956/07/28 Today's Date: 10/20/2023   History of Present Illness 68 y.o. male admitted 10/10/2023 with SVT. Pt diagnosed with CAP 10/08/23. PMHx: PAF, HTN, chronic diastolic HF, bipolar disorder, RBBB, BPPV, MDD, substance abuse, DM    PT Comments  Pt initially resistant to mobility stating he walked to bathroom but then agreeable. Pt demonstrates limited reception to cues and instruction but did progress to walking to door and back. Pt safety remains limited by knee pain and lack of brakes on rollator that pt insists on using. Session limited by MD arrival but pt also states he would be done. Will continue to attempt further progression. Patient will benefit from continued inpatient follow up therapy, <3 hours/day     If plan is discharge home, recommend the following: Assistance with cooking/housework;Assist for transportation;Help with stairs or ramp for entrance;Direct supervision/assist for medications management;A little help with walking and/or transfers;A little help with bathing/dressing/bathroom   Can travel by private vehicle     Yes  Equipment Recommendations  Wheelchair (measurements PT);Wheelchair cushion (measurements PT)    Recommendations for Other Services       Precautions / Restrictions Precautions Precautions: Fall Recall of Precautions/Restrictions: Impaired Precaution/Restrictions Comments: knee pain, prefers bil knee sleeves     Mobility  Bed Mobility Overal bed mobility: Modified Independent             General bed mobility comments: pt able to transition to EOB with HOB 20 degrees without assist. Assist to don knee sleeves EOB but pt donned socks in sitting    Transfers Overall transfer level: Needs assistance   Transfers: Sit to/from Stand Sit to Stand: Min assist           General transfer comment: pt continues to demand use of rollator despite broken  brakes, wheel held steady with therapist foot. pt fearful of therapist hands not on him and does not comprehend need to reposition guarding as pt moves. Pt sat at rollator and required assist and mod cues to safely descend and rise from rollator given non-functioning brakes    Ambulation/Gait Ambulation/Gait assistance: Min assist Gait Distance (Feet): 16 Feet Assistive device: Rollator (4 wheels) Gait Pattern/deviations: Step-through pattern, Decreased stride length, Trunk flexed   Gait velocity interpretation: <1.8 ft/sec, indicate of risk for recurrent falls   General Gait Details: pt walked 16' x 2 trials with seated rest on rollator between trials   Stairs             Wheelchair Mobility     Tilt Bed    Modified Rankin (Stroke Patients Only)       Balance Overall balance assessment: Needs assistance Sitting-balance support: Feet supported Sitting balance-Leahy Scale: Good     Standing balance support: Reliant on assistive device for balance, Single extremity supported, Bilateral upper extremity supported Standing balance-Leahy Scale: Poor Standing balance comment: rollator in standing with CGA                            Communication Communication Communication: No apparent difficulties  Cognition Arousal: Alert Behavior During Therapy: WFL for tasks assessed/performed   PT - Cognitive impairments: Safety/Judgement, Awareness, Attention, Problem solving, No family/caregiver present to determine baseline                       PT - Cognition Comments: pt can be impulsive and flip between pleasant  and easily irritable, pt with limited adherence to direction and cues Following commands: Impaired Following commands impaired: Follows multi-step commands inconsistently    Cueing Cueing Techniques: Verbal cues, Tactile cues  Exercises      General Comments        Pertinent Vitals/Pain Pain Assessment Pain Score: 6  Pain Location: bil  knees Pain Descriptors / Indicators: Aching Pain Intervention(s): Limited activity within patient's tolerance, Monitored during session, Repositioned    Home Living                          Prior Function            PT Goals (current goals can now be found in the care plan section) Progress towards PT goals: Progressing toward goals    Frequency    Min 2X/week      PT Plan      Co-evaluation              AM-PAC PT "6 Clicks" Mobility   Outcome Measure  Help needed turning from your back to your side while in a flat bed without using bedrails?: None Help needed moving from lying on your back to sitting on the side of a flat bed without using bedrails?: None Help needed moving to and from a bed to a chair (including a wheelchair)?: A Little Help needed standing up from a chair using your arms (e.g., wheelchair or bedside chair)?: A Little Help needed to walk in hospital room?: A Little Help needed climbing 3-5 steps with a railing? : A Lot 6 Click Score: 19    End of Session Equipment Utilized During Treatment: Gait belt Activity Tolerance: Patient tolerated treatment well Patient left: in bed;with call bell/phone within reach (Pt denied up to chair and sitting EOB with MD end of session) Nurse Communication: Mobility status PT Visit Diagnosis: Other abnormalities of gait and mobility (R26.89);Muscle weakness (generalized) (M62.81);History of falling (Z91.81)     Time: 9811-9147 PT Time Calculation (min) (ACUTE ONLY): 19 min  Charges:    $Gait Training: 8-22 mins PT General Charges $$ ACUTE PT VISIT: 1 Visit                     Merryl Hacker, PT Acute Rehabilitation Services Office: [phone]    Enedina Finner Kazuki Ingle 10/20/2023, 12:15 PM

## 2023-10-20 NOTE — Plan of Care (Signed)

## 2023-10-20 NOTE — Progress Notes (Signed)
 PROGRESS NOTE    Todd [name].  ZOX:[id number] DOB: 1956/04/02 DOA: 10/10/2023 PCP: Blima Singer, PA  Chief Complaint  Patient presents with   Tachycardia    Hospital Course:  Todd [name]. is 68 y.o. male with heart failure, type 2 diabetes, hypertension, pulmonary embolisms not on anticoagulation, recent pneumonia, anxiety, depression osteoarthritis, who was brought to the ED from his doctor's office with SVT, heart rate in the 170s on arrival.  Patient has receiving a preoperative evaluation for knee replacement at the time of this visit.  He was given 6 mg of adenosine by EMS converted to NSR with a heart rate of 88.  On arrival to the ED heart rate elevated again to 166.  He remained in SVT despite an additional 6 mg of adenosine and then underwent attempted electrocardioversion in the ED.  Cardiology was consulted.  He was started on Cardizem drip.  Echocardiogram was ordered.  He converted back to NSR as able to be transitioned to Cardizem p.o.  He is medically stable to discharge with outpatient Zio patch and EP follow-up.  Unfortunately, the patient is very physically deconditioned in part due to his severe osteoarthritis and therapy has recommended SNF placement.  Subjective: No acute events overnight. Pt working with PT on arrival and reports he cannot do any more but enjoyed his session today. He has ongoing concerns with his pain management. We discussed multimodal pain control. He reports he has been reviewing a new rehab facility.  Objective: Vitals:   10/19/23 1953 10/20/23 0500 10/20/23 0508 10/20/23 0739  BP: 106/71  122/77 126/77  Pulse: 60  60 (!) 57  Resp: 18  18 17   Temp: 98 F (36.7 C)  97.8 F (36.6 C) 98.1 F (36.7 C)  TempSrc: Oral  Oral Oral  SpO2: 100%  100% 95%  Weight:  93.5 kg    Height:        Intake/Output Summary (Last 24 hours) at 10/20/2023 0746 Last data filed at 10/20/2023 0744 Gross per 24 hour  Intake --  Output 1250 ml   Net -1250 ml   Filed Weights   10/18/23 0500 10/19/23 0500 10/20/23 0500  Weight: 94 kg 93.1 kg 93.5 kg    Examination: General exam: Appears calm and comfortable, NAD  Respiratory system: No work of breathing, symmetric chest wall expansion Cardiovascular system: S1 & S2 heard, RRR.  Gastrointestinal system: Abdomen is nondistended, soft and nontender.  Neuro: Alert and oriented. No focal neurological deficits. Extremities: Symmetric, expected ROM Skin: No rashes, lesions Psychiatry: Demonstrates appropriate judgement and insight. Mood & affect appropriate for situation.   Assessment & Plan:  Principal Problem:   SVT (supraventricular tachycardia) (HCC) Active Problems:   Non-insulin dependent type 2 diabetes mellitus (HCC)   Primary hypertension   Chest pain of uncertain etiology   AKI (acute kidney injury) (HCC)   CAP (community acquired pneumonia)   Paroxysmal atrial flutter (HCC)   Hypomagnesemia   Macrocytic anemia   Chronic diastolic CHF (congestive heart failure) (HCC)   SOB (shortness of breath)   Supraventricular tachycardia (HCC)    SVT History of atrial flutter - Not on nodal blocking agent on arrival - Not on anticoagulation outpatient - SVT initially refractory to 2 doses of adenosine but did resolve after Cardizem drip. - Cardiology consulted, now stabilized on Cardizem CD1 80 daily - Discontinued home amlodipine - Needs 2 weeks outpatient Zio patch - EP follow-up in 2 weeks - Continue to optimize electrolytes - Has been  counseled on alcohol cessation - TSH 1.2  Chronic diastolic CHF - BNP elevated to 812 on arrival - Clinically euvolemic on exam - Chest x-ray does not indicate acute volume overload - BNP elevation may be secondary to SVT - Echo EF 55 to 60%, no regional wall motion abnormalities, normal diastolic parameters - Coronary CTA performed 2/27 with minimally obstructive CAD.  No further workup recommended by  cardiology  Community-acquired pneumonia - Chest x-ray with bronchitis changes and some improvement of left lung infiltrate - Respiratory viral panel negative -- Strep pneumo neg. - Status post Rocephin and doxycycline  AKI - Baseline creatinine 1.0,some worsening With nephrotoxic meds - Renally dosed with a creatinine clearance of 64 - Trend CMP  Macrocytic anemia B12 deficiency - Has had drop in hemoglobin.  Denies melena or hematochezia. - Not on anticoagulation/antiplatelets but does take naproxen. - B12 deficiency -- Suspect secondary to EtOH abuse - Status post B12 injection 1000 mcg x 2 followed by p.o. - Continue PPI - Hgb stable and rising  Hypertension - Normotensive - Continue Cardizem as above  Non-insulin-dependent type 2 diabetes with hyperglycemia - Hemoglobin A1c 5.8% - No need for insulin at this time  Chronic pain Osteoarthritis Ambulatory dysfunction - Previously on opiates outpatient but has not filled any since July 06, 2023 per PDMP - Is currently on Percocet, discussed with him this is not appropriate for long term pain control. -- Initiate better multimodal pain control today. Scheduled tylenol, lidocaine patches. Will change Zoloft to Cymbalta given better long term pain control. On slow taper of zoloft currently (was on 150mg  at home) currently 50mg , cont this and then discontinue and start duloxetine next week  - PT/OT recommending SNF - Needs to keep outpatient follow-up with orthopedic surgery for knee replacement workup  Dysphagia - Oropharyngeal dysphagia.  SLP eval ordered -Was on dysphasia diet previously, appears this has been advanced now.  Tobacco use disorder - Patient refuses nicotine patch at this time - Has been counseled on smoking cessation  EtOH abuse - Last drink 2/23 - Has been monitored for withdrawal, now out of withdrawal window - Multivitamin, thiamine, folic acid - TOC consulted for resource assistance  BMI  29 - Outpatient follow up for lifestyle modification and risk factor management   DVT prophylaxis: Lovenox   Code Status: Full Code Family Communication:  Discussed directly with patient Disposition:  Inpatient still hospitalized for dispo planning and pain medication titration, will discharge to SNF/Rehab when this has been arranged by [facility]  Consultants:    Procedures:    Antimicrobials:  Anti-infectives (From admission, onward)    Start     Dose/Rate Route Frequency Ordered Stop   10/10/23 2100  doxycycline (VIBRAMYCIN) 100 mg in sodium chloride 0.9 % 250 mL IVPB  Status:  Discontinued        100 mg 125 mL/hr over 120 Minutes Intravenous Every 12 hours 10/10/23 2045 10/13/23 1056   10/10/23 2100  cefTRIAXone (ROCEPHIN) 1 g in sodium chloride 0.9 % 100 mL IVPB  Status:  Discontinued        1 g 200 mL/hr over 30 Minutes Intravenous Every 24 hours 10/10/23 2045 10/13/23 1056       Data Reviewed: I have personally reviewed following labs and imaging studies CBC: No results for input(s): "WBC", "NEUTROABS", "HGB", "HCT", "MCV", "PLT" in the last 168 hours. Basic Metabolic Panel: No results for input(s): "NA", "K", "CL", "CO2", "GLUCOSE", "BUN", "CREATININE", "CALCIUM", "MG", "PHOS" in the last 168 hours.  GFR: Estimated Creatinine Clearance: 74.2 mL/min (by C-G formula based on SCr of 1.09 mg/dL). Liver Function Tests: No results for input(s): "AST", "ALT", "ALKPHOS", "BILITOT", "PROT", "ALBUMIN" in the last 168 hours. CBG: Recent Labs  Lab 10/18/23 2118 10/19/23 0804 10/19/23 1245 10/19/23 1628 10/19/23 2158  GLUCAP 107* 209* 232* 121* 104*    Recent Results (from the past 240 hours)  Respiratory (~20 pathogens) panel by PCR     Status: None   Collection Time: 10/11/23  3:03 PM   Specimen: Nasopharyngeal Swab; Respiratory  Result Value Ref Range Status   Adenovirus NOT DETECTED NOT DETECTED Final   Coronavirus 229E NOT DETECTED NOT DETECTED Final    Comment:  (NOTE) The Coronavirus on the Respiratory Panel, DOES NOT test for the novel  Coronavirus (2019 nCoV)    Coronavirus HKU1 NOT DETECTED NOT DETECTED Final   Coronavirus NL63 NOT DETECTED NOT DETECTED Final   Coronavirus OC43 NOT DETECTED NOT DETECTED Final   Metapneumovirus NOT DETECTED NOT DETECTED Final   Rhinovirus / Enterovirus NOT DETECTED NOT DETECTED Final   Influenza A NOT DETECTED NOT DETECTED Final   Influenza B NOT DETECTED NOT DETECTED Final   Parainfluenza Virus 1 NOT DETECTED NOT DETECTED Final   Parainfluenza Virus 2 NOT DETECTED NOT DETECTED Final   Parainfluenza Virus 3 NOT DETECTED NOT DETECTED Final   Parainfluenza Virus 4 NOT DETECTED NOT DETECTED Final   Respiratory Syncytial Virus NOT DETECTED NOT DETECTED Final   Bordetella pertussis NOT DETECTED NOT DETECTED Final   Bordetella Parapertussis NOT DETECTED NOT DETECTED Final   Chlamydophila pneumoniae NOT DETECTED NOT DETECTED Final   Mycoplasma pneumoniae NOT DETECTED NOT DETECTED Final    Comment: Performed at [facility], [address], [place], [place] [address]     Radiology Studies: No results found.  Scheduled Meds:  acetaminophen  650 mg Oral Q6H   vitamin B-12  1,000 mcg Oral Daily   diltiazem  180 mg Oral Daily   enoxaparin (LOVENOX) injection  40 mg Subcutaneous Q24H   folic acid  1 mg Oral Daily   insulin aspart  0-6 Units Subcutaneous TID WC   lidocaine  2 patch Transdermal Q24H   multivitamin with minerals  1 tablet Oral Daily   pantoprazole  40 mg Oral Daily   sertraline  50 mg Oral Daily   thiamine  100 mg Oral Daily   traZODone  300 mg Oral QHS   Continuous Infusions:   LOS: 0 days    Total time spent interpreting labs and vitals, coordinating care amongst consultants and care team members, directly assessing and discussing care with the patient and/or family: 46 min   Debarah Crape, DO [facility]  To contact the attending physician between 7A-7P please use Epic  Chat. To contact the covering physician during after hours 7P-7A, please review Amion.   10/20/2023, 7:46 AM   *This document has been created with the assistance of dictation software. Please excuse typographical errors. *

## 2023-10-21 DIAGNOSIS — I471 Supraventricular tachycardia, unspecified: Secondary | ICD-10-CM | POA: Diagnosis not present

## 2023-10-21 DIAGNOSIS — R079 Chest pain, unspecified: Secondary | ICD-10-CM | POA: Diagnosis not present

## 2023-10-21 DIAGNOSIS — J189 Pneumonia, unspecified organism: Secondary | ICD-10-CM | POA: Diagnosis not present

## 2023-10-21 DIAGNOSIS — N179 Acute kidney failure, unspecified: Secondary | ICD-10-CM | POA: Diagnosis not present

## 2023-10-21 LAB — GLUCOSE, CAPILLARY
Glucose-Capillary: 91 mg/dL (ref 70–99)
Glucose-Capillary: 159 mg/dL — ABNORMAL HIGH (ref 70–99)
Glucose-Capillary: 117 mg/dL — ABNORMAL HIGH (ref 70–99)
Glucose-Capillary: 134 mg/dL — ABNORMAL HIGH (ref 70–99)

## 2023-10-21 LAB — COMPREHENSIVE METABOLIC PANEL
ALT: 12 U/L (ref 0–44)
AST: 22 U/L (ref 15–41)
Albumin: 3.3 g/dL — ABNORMAL LOW (ref 3.5–5.0)
Alkaline Phosphatase: 44 U/L (ref 38–126)
Anion gap: 11 (ref 5–15)
BUN: 20 mg/dL (ref 8–23)
CO2: 22 mmol/L (ref 22–32)
Calcium: 9.5 mg/dL (ref 8.9–10.3)
Chloride: 105 mmol/L (ref 98–111)
Creatinine, Ser: 1.36 mg/dL — ABNORMAL HIGH (ref 0.61–1.24)
GFR, Estimated: 57 mL/min — ABNORMAL LOW (ref >60)
Glucose, Bld: 139 mg/dL — ABNORMAL HIGH (ref 70–99)
Potassium: 4.4 mmol/L (ref 3.5–5.1)
Sodium: 138 mmol/L (ref 135–145)
Total Bilirubin: 0.7 mg/dL (ref 0.0–1.2)
Total Protein: 7.1 g/dL (ref 6.5–8.1)

## 2023-10-21 MED ORDER — GABAPENTIN 100 MG PO CAPS
200.0000 mg | ORAL_CAPSULE | Freq: Three times a day (TID) | ORAL | Status: DC
  Administered 2023-10-21 – 2023-10-25 (×12): 200 mg via ORAL
  Filled 2023-10-21 (×13): qty 2

## 2023-10-21 NOTE — Plan of Care (Signed)

## 2023-10-21 NOTE — Progress Notes (Signed)
 Occupational Therapy Treatment Patient Details Name: Todd [name]. MRN: [id number] DOB: June 03, 1956 Today's Date: 10/21/2023   History of present illness 68 y.o. male admitted 10/10/2023 with SVT. Pt diagnosed with CAP 10/08/23. PMHx: PAF, HTN, chronic diastolic HF, bipolar disorder, RBBB, BPPV, MDD, substance abuse, DM   OT comments  Pt did well with today's session, able to ambulate ~39ft in hallway with Min A but was not able to go further due to exacerbated knee pain. Pt needing min A to balance and transition into Rollator to be transported back to room from OT, continues to need assist with LB ADLs. OT to continue to progress pt as able, DC plans update to SNF.       If plan is discharge home, recommend the following:  A lot of help with walking and/or transfers;A lot of help with bathing/dressing/bathroom;Assist for transportation;Assistance with cooking/housework;Help with stairs or ramp for entrance   Equipment Recommendations  Tub/shower bench    Recommendations for Other Services      Precautions / Restrictions Precautions Precautions: Fall Precaution/Restrictions Comments: knee pain, prefers bil knee sleeves Restrictions Weight Bearing Restrictions Per Provider Order: No       Mobility Bed Mobility Overal bed mobility: Modified Independent             General bed mobility comments: inc time,    Transfers Overall transfer level: Needs assistance Equipment used: Rollator (4 wheels) Transfers: Sit to/from Stand Sit to Stand: Min assist           General transfer comment: pt requests hands on assist from OT at all times, prefers to be guarded underneath his shoulders despite explaining the need for therapist to repositon for optimal safety     Balance Overall balance assessment: Needs assistance Sitting-balance support: Feet supported Sitting balance-Leahy Scale: Good     Standing balance support: Reliant on assistive device for balance, Bilateral  upper extremity supported, During functional activity Standing balance-Leahy Scale: Poor Standing balance comment: Rollator reliant                           ADL either performed or assessed with clinical judgement   ADL Overall ADL's : Needs assistance/impaired                     Lower Body Dressing: Maximal assistance;Sit to/from stand;Sitting/lateral leans Lower Body Dressing Details (indicate cue type and reason): don bilat knee sleeves, pants, and socks             Functional mobility during ADLs: Minimal assistance;Rollator (4 wheels) General ADL Comments: focused session on OOB mobility    Extremity/Trunk Assessment              Vision       Perception     Praxis     Communication Communication Communication: No apparent difficulties   Cognition Arousal: Alert Behavior During Therapy: WFL for tasks assessed/performed Cognition: No apparent impairments                               Following commands: Impaired Following commands impaired: Follows multi-step commands inconsistently      Cueing   Cueing Techniques: Verbal cues, Tactile cues  Exercises      Shoulder Instructions       General Comments      Pertinent Vitals/ Pain       Pain Assessment Pain  Assessment: 0-10 Pain Score: 9  Pain Location: bil knees after mobility Pain Descriptors / Indicators: Aching, Sore Pain Intervention(s): Monitored during session, Limited activity within patient's tolerance, Repositioned, Patient requesting pain meds-RN notified  Home Living                                          Prior Functioning/Environment              Frequency           Progress Toward Goals  OT Goals(current goals can now be found in the care plan section)  Progress towards OT goals: Progressing toward goals  Acute Rehab OT Goals OT Goal Formulation: With patient Time For Goal Achievement: 10/25/23 Potential to  Achieve Goals: Fair  Plan      Co-evaluation                 AM-PAC OT "6 Clicks" Daily Activity     Outcome Measure   Help from another person eating meals?: None Help from another person taking care of personal grooming?: A Little Help from another person toileting, which includes using toliet, bedpan, or urinal?: A Lot Help from another person bathing (including washing, rinsing, drying)?: A Lot Help from another person to put on and taking off regular upper body clothing?: A Little Help from another person to put on and taking off regular lower body clothing?: A Lot 6 Click Score: 16    End of Session Equipment Utilized During Treatment: Rollator (4 wheels);Gait belt  OT Visit Diagnosis: Unsteadiness on feet (R26.81);Other abnormalities of gait and mobility (R26.89);Muscle weakness (generalized) (M62.81);Pain Pain - Right/Left: Left Pain - part of body: Knee   Activity Tolerance Patient tolerated treatment well   Patient Left in bed;with call bell/phone within reach;with nursing/sitter in room   Nurse Communication Mobility status        Time: 1610-9604 OT Time Calculation (min): 28 min  Charges: OT General Charges $OT Visit: 1 Visit OT Treatments $Therapeutic Activity: 23-37 mins  10/21/2023  AB, OTR/L  Acute Rehabilitation Services  Office: [phone]   Tristan Schroeder 10/21/2023, 1:50 PM

## 2023-10-21 NOTE — Progress Notes (Signed)
 PROGRESS NOTE    [name].  ZOX:[id number] DOB: 1956/08/04 DOA: 10/10/2023 PCP: Blima Singer, PA  Chief Complaint  Patient presents with   Tachycardia    Hospital Course:  Todd [name]. is 68 y.o. male with heart failure, type 2 diabetes, hypertension, pulmonary embolisms not on anticoagulation, recent pneumonia, anxiety, depression osteoarthritis, who was brought to the ED from his doctor's office with SVT, heart rate in the 170s on arrival.  Patient has receiving a preoperative evaluation for knee replacement at the time of this visit.  He was given 6 mg of adenosine by EMS converted to NSR with a heart rate of 88.  On arrival to the ED heart rate elevated again to 166.  He remained in SVT despite an additional 6 mg of adenosine and then underwent attempted electrocardioversion in the ED.  Cardiology was consulted.  He was started on Cardizem drip.  Echocardiogram was ordered.  He converted back to NSR as able to be transitioned to Cardizem p.o.  He is medically stable to discharge with outpatient Zio patch and EP follow-up.  Unfortunately, the patient is very physically deconditioned in part due to his severe osteoarthritis and therapy has recommended SNF placement.  Subjective: No acute events overnight. Working with OT this AM.  Objective: Vitals:   10/20/23 0847 10/20/23 1526 10/21/23 0500 10/21/23 0514  BP:  121/73  133/85  Pulse: 64 61  63  Resp:  18  18  Temp:  98.1 F (36.7 C)  98.6 F (37 C)  TempSrc:  Oral  Oral  SpO2: 97% 98%  94%  Weight:   92.6 kg   Height:        Intake/Output Summary (Last 24 hours) at 10/21/2023 0817 Last data filed at 10/21/2023 0500 Gross per 24 hour  Intake --  Output 1050 ml  Net -1050 ml   Filed Weights   10/19/23 0500 10/20/23 0500 10/21/23 0500  Weight: 93.1 kg 93.5 kg 92.6 kg    Examination: General exam: Appears calm and comfortable, NAD  Respiratory system: No work of breathing, symmetric chest wall  expansion Cardiovascular system: S1 & S2 heard, RRR.  Gastrointestinal system: Abdomen is nondistended, soft and nontender.  Neuro: Alert and oriented. No focal neurological deficits. Extremities: Symmetric, expected ROM Skin: No rashes, lesions Psychiatry: Demonstrates appropriate judgement and insight. Mood & affect appropriate for situation.   Assessment & Plan:  Principal Problem:   SVT (supraventricular tachycardia) (HCC) Active Problems:   Non-insulin dependent type 2 diabetes mellitus (HCC)   Primary hypertension   Chest pain of uncertain etiology   AKI (acute kidney injury) (HCC)   CAP (community acquired pneumonia)   Paroxysmal atrial flutter (HCC)   Hypomagnesemia   Macrocytic anemia   Chronic diastolic CHF (congestive heart failure) (HCC)   SOB (shortness of breath)   Supraventricular tachycardia (HCC)    SVT History of atrial flutter - Not on nodal blocking agent on arrival - Not on anticoagulation outpatient - SVT initially refractory to 2 doses of adenosine but did resolve after Cardizem drip. - Cardiology consulted, now stabilized on Cardizem CD1 80 daily - Discontinued home amlodipine - Needs 2 weeks outpatient Zio patch - EP follow-up in 2 weeks - Continue to optimize electrolytes - Has been counseled on alcohol cessation - TSH 1.2  Chronic diastolic CHF - BNP elevated to 812 on arrival - Clinically euvolemic on exam - Chest x-ray does not indicate acute volume overload - BNP elevation may be secondary to SVT -  Echo EF 55 to 60%, no regional wall motion abnormalities, normal diastolic parameters - Coronary CTA performed 2/27 with minimally obstructive CAD.  No further workup recommended by cardiology  Community-acquired pneumonia - Chest x-ray with bronchitis changes and some improvement of left lung infiltrate - Respiratory viral panel negative -- Strep pneumo neg. - S/p Rocephin and doxycycline  AKI - Baseline creatinine 1.0, some  worsening With nephrotoxic meds - Renally dosed with a creatinine clearance of 64 - Trend CMP  Macrocytic anemia B12 deficiency - Has had drop in hemoglobin.  Denies melena or hematochezia. - Not on anticoagulation/antiplatelets but does take naproxen. - B12 deficiency -- Suspect secondary to EtOH abuse - Status post B12 injection 1000 mcg x 2 followed by p.o. - Continue PPI - Hgb stable and rising  Hypertension - Normotensive - Continue Cardizem as above  Non-insulin-dependent type 2 diabetes with hyperglycemia - Hemoglobin A1c 5.8% - No need for insulin at this time  Chronic pain Osteoarthritis Ambulatory dysfunction - Previously on opiates outpatient but has not filled any since July 06, 2023 per PDMP - Is currently on Percocet, discussed with him this is not appropriate for long term pain control. -- Multimodal pain control. Scheduled tylenol, gabapentin, lidocaine patches. PRN percocet. NSAIDs limited by kidney function.  --Will change Zoloft to Cymbalta given better long term pain control. On slow taper of zoloft currently (was on 150mg  at home) currently 50mg , cont this and then discontinue and start duloxetine next week  - PT/OT recommending SNF - Needs to keep outpatient follow-up with orthopedic surgery for knee replacement workup  Dysphagia - Oropharyngeal dysphagia.  SLP eval ordered -Was on dysphasia diet previously, appears this has been advanced now.  Tobacco use disorder - Patient refuses nicotine patch at this time - Has been counseled on smoking cessation  EtOH abuse - Last drink 2/23 - Has been monitored for withdrawal, now out of withdrawal window - Multivitamin, thiamine, folic acid - TOC consulted for resource assistance  BMI 29 - Outpatient follow up for lifestyle modification and risk factor management   DVT prophylaxis: Lovenox   Code Status: Full Code Family Communication:  Discussed directly with patient Disposition:  Inpatient  still hospitalized for dispo planning and pain medication titration, will discharge to SNF/Rehab when this has been arranged by TOC. Medically cleared otherwise  Consultants:    Procedures:    Antimicrobials:  Anti-infectives (From admission, onward)    Start     Dose/Rate Route Frequency Ordered Stop   10/10/23 2100  doxycycline (VIBRAMYCIN) 100 mg in sodium chloride 0.9 % 250 mL IVPB  Status:  Discontinued        100 mg 125 mL/hr over 120 Minutes Intravenous Every 12 hours 10/10/23 2045 10/13/23 1056   10/10/23 2100  cefTRIAXone (ROCEPHIN) 1 g in sodium chloride 0.9 % 100 mL IVPB  Status:  Discontinued        1 g 200 mL/hr over 30 Minutes Intravenous Every 24 hours 10/10/23 2045 10/13/23 1056       Data Reviewed: I have personally reviewed following labs and imaging studies CBC: Recent Labs  Lab 10/20/23 0706  WBC 4.2  HGB 12.9*  HCT 39.9  MCV 100.3*  PLT 240   Basic Metabolic Panel: Recent Labs  Lab 10/20/23 0706  NA 138  K 5.0  CL 105  CO2 26  GLUCOSE 109*  BUN 22  CREATININE 1.25*  CALCIUM 9.5   GFR: Estimated Creatinine Clearance: 64.5 mL/min (A) (by C-G formula based  on SCr of 1.25 mg/dL (H)). Liver Function Tests: Recent Labs  Lab 10/20/23 0706  AST 18  ALT 12  ALKPHOS 47  BILITOT 0.6  PROT 6.9  ALBUMIN 3.1*   CBG: Recent Labs  Lab 10/19/23 2158 10/20/23 0828 10/20/23 1156 10/20/23 1641 10/20/23 2146  GLUCAP 104* 91 141* 116* 128*    Recent Results (from the past 240 hours)  Respiratory (~20 pathogens) panel by PCR     Status: None   Collection Time: 10/11/23  3:03 PM   Specimen: Nasopharyngeal Swab; Respiratory  Result Value Ref Range Status   Adenovirus NOT DETECTED NOT DETECTED Final   Coronavirus 229E NOT DETECTED NOT DETECTED Final    Comment: (NOTE) The Coronavirus on the Respiratory Panel, DOES NOT test for the novel  Coronavirus (2019 nCoV)    Coronavirus HKU1 NOT DETECTED NOT DETECTED Final   Coronavirus NL63 NOT  DETECTED NOT DETECTED Final   Coronavirus OC43 NOT DETECTED NOT DETECTED Final   Metapneumovirus NOT DETECTED NOT DETECTED Final   Rhinovirus / Enterovirus NOT DETECTED NOT DETECTED Final   Influenza A NOT DETECTED NOT DETECTED Final   Influenza B NOT DETECTED NOT DETECTED Final   Parainfluenza Virus 1 NOT DETECTED NOT DETECTED Final   Parainfluenza Virus 2 NOT DETECTED NOT DETECTED Final   Parainfluenza Virus 3 NOT DETECTED NOT DETECTED Final   Parainfluenza Virus 4 NOT DETECTED NOT DETECTED Final   Respiratory Syncytial Virus NOT DETECTED NOT DETECTED Final   Bordetella pertussis NOT DETECTED NOT DETECTED Final   Bordetella Parapertussis NOT DETECTED NOT DETECTED Final   Chlamydophila pneumoniae NOT DETECTED NOT DETECTED Final   Mycoplasma pneumoniae NOT DETECTED NOT DETECTED Final    Comment: Performed at [facility], [address], [place], [place] [address]     Radiology Studies: No results found.  Scheduled Meds:  acetaminophen  650 mg Oral Q6H   vitamin B-12  1,000 mcg Oral Daily   diltiazem  180 mg Oral Daily   enoxaparin (LOVENOX) injection  40 mg Subcutaneous Q24H   folic acid  1 mg Oral Daily   insulin aspart  0-6 Units Subcutaneous TID WC   lidocaine  2 patch Transdermal Q24H   multivitamin with minerals  1 tablet Oral Daily   pantoprazole  40 mg Oral Daily   sertraline  50 mg Oral Daily   thiamine  100 mg Oral Daily   traZODone  300 mg Oral QHS   Continuous Infusions:   LOS: 0 days    Total time spent interpreting labs and vitals, coordinating care amongst consultants and care team members, directly assessing and discussing care with the patient and/or family: 46 min   Debarah Crape, DO [facility]  To contact the attending physician between 7A-7P please use Epic Chat. To contact the covering physician during after hours 7P-7A, please review Amion.   10/21/2023, 8:17 AM   *This document has been created with the assistance of dictation  software. Please excuse typographical errors. *

## 2023-10-21 NOTE — TOC Progression Note (Signed)
 Transition of Care (TOC) - Progression Note    Patient Details  Name: Todd [name]. MRN: [id number] Date of Birth: 04/23/1956  Transition of [facility]) CM/SW Contact  Tzippy Testerman A [place], LCSW Phone Number: 10/21/2023, 2:52 PM  Clinical Narrative:      CSW was contacted by Ty at [facility], stated she would be able to meet pt in person today. CSW provided contact for pt's niece Tica, to assist with pt's care.   TOC team reached out to Hydia at [facility] to get update on decision regarding pt's referral.   Placement pending, EDD to be determined.   TOC will continue to follow.       Expected Discharge Plan and Services                                               Social Determinants of Health (SDOH) Interventions SDOH Screenings   Food Insecurity: Food Insecurity Present (10/11/2023)  Housing: High Risk (10/11/2023)  Transportation Needs: Unmet Transportation Needs (10/11/2023)  Utilities: Not At Risk (10/11/2023)  Alcohol Screen: Low Risk  (04/20/2023)  Financial Resource Strain: Low Risk  (10/27/2022)   Received from [facility]  Physical Activity: Inactive (10/27/2022)   Received from [facility]  Social Connections: Socially Isolated (10/11/2023)  Stress: Stress Concern Present (10/27/2022)   Received from [facility]  Tobacco Use: High Risk (10/10/2023)  Health Literacy: Low Risk  (10/27/2022)   Received from [facility]    Readmission Risk Interventions    10/11/2023    3:09 PM 09/02/2021    1:12 PM  Readmission Risk Prevention Plan  Transportation Screening Complete Complete  PCP or Specialist Appt within 3-5 Days Complete   HRI or Home Care Consult Complete Complete  Social Work Consult for Recovery Care Planning/Counseling Complete Complete  Palliative Care Screening Complete Not Applicable  Medication Review Oceanographer) Complete

## 2023-10-22 DIAGNOSIS — J189 Pneumonia, unspecified organism: Secondary | ICD-10-CM | POA: Diagnosis not present

## 2023-10-22 DIAGNOSIS — R079 Chest pain, unspecified: Secondary | ICD-10-CM | POA: Diagnosis not present

## 2023-10-22 DIAGNOSIS — I471 Supraventricular tachycardia, unspecified: Secondary | ICD-10-CM | POA: Diagnosis not present

## 2023-10-22 DIAGNOSIS — N179 Acute kidney failure, unspecified: Secondary | ICD-10-CM | POA: Diagnosis not present

## 2023-10-22 LAB — CBC WITH DIFFERENTIAL/PLATELET
Abs Immature Granulocytes: 0.01 10*3/uL (ref 0.00–0.07)
Basophils Absolute: 0 10*3/uL (ref 0.0–0.1)
Basophils Relative: 1 %
Eosinophils Absolute: 0.2 10*3/uL (ref 0.0–0.5)
Eosinophils Relative: 5 %
HCT: 40.7 % (ref 39.0–52.0)
Hemoglobin: 13 g/dL (ref 13.0–17.0)
Immature Granulocytes: 0 %
Lymphocytes Relative: 47 %
Lymphs Abs: 1.9 10*3/uL (ref 0.7–4.0)
MCH: 32.3 pg (ref 26.0–34.0)
MCHC: 31.9 g/dL (ref 30.0–36.0)
MCV: 101.2 fL — ABNORMAL HIGH (ref 80.0–100.0)
Monocytes Absolute: 0.4 10*3/uL (ref 0.1–1.0)
Monocytes Relative: 11 %
Neutro Abs: 1.5 10*3/uL — ABNORMAL LOW (ref 1.7–7.7)
Neutrophils Relative %: 36 %
Platelets: 251 10*3/uL (ref 150–400)
RBC: 4.02 MIL/uL — ABNORMAL LOW (ref 4.22–5.81)
RDW: 15.6 % — ABNORMAL HIGH (ref 11.5–15.5)
WBC: 4 10*3/uL (ref 4.0–10.5)
nRBC: 0 % (ref 0.0–0.2)

## 2023-10-22 LAB — MAGNESIUM: Magnesium: 1.9 mg/dL (ref 1.7–2.4)

## 2023-10-22 LAB — COMPREHENSIVE METABOLIC PANEL
ALT: 10 U/L (ref 0–44)
AST: 22 U/L (ref 15–41)
Albumin: 3.1 g/dL — ABNORMAL LOW (ref 3.5–5.0)
Alkaline Phosphatase: 41 U/L (ref 38–126)
Anion gap: 9 (ref 5–15)
BUN: 23 mg/dL (ref 8–23)
CO2: 24 mmol/L (ref 22–32)
Calcium: 9.3 mg/dL (ref 8.9–10.3)
Chloride: 105 mmol/L (ref 98–111)
Creatinine, Ser: 1.4 mg/dL — ABNORMAL HIGH (ref 0.61–1.24)
GFR, Estimated: 55 mL/min — ABNORMAL LOW (ref >60)
Glucose, Bld: 103 mg/dL — ABNORMAL HIGH (ref 70–99)
Potassium: 4.5 mmol/L (ref 3.5–5.1)
Sodium: 138 mmol/L (ref 135–145)
Total Bilirubin: 0.6 mg/dL (ref 0.0–1.2)
Total Protein: 6.8 g/dL (ref 6.5–8.1)

## 2023-10-22 LAB — GLUCOSE, CAPILLARY
Glucose-Capillary: 108 mg/dL — ABNORMAL HIGH (ref 70–99)
Glucose-Capillary: 110 mg/dL — ABNORMAL HIGH (ref 70–99)
Glucose-Capillary: 85 mg/dL (ref 70–99)
Glucose-Capillary: 93 mg/dL (ref 70–99)

## 2023-10-22 LAB — PHOSPHORUS: Phosphorus: 3.7 mg/dL (ref 2.5–4.6)

## 2023-10-22 NOTE — Progress Notes (Signed)
 PROGRESS NOTE    [name].  VHQ:[id number] DOB: [birth year] DOA: 10/10/2023 PCP: Blima Singer, PA  Chief Complaint  Patient presents with   Tachycardia    Hospital Course:  Todd [name]. is 68 y.o. male with heart failure, type 2 diabetes, hypertension, pulmonary embolisms not on anticoagulation, recent pneumonia, anxiety, depression osteoarthritis, who was brought to the ED from his doctor's office with SVT, heart rate in the 170s on arrival.  Patient has receiving a preoperative evaluation for knee replacement at the time of this visit.  He was given 6 mg of adenosine by EMS converted to NSR with a heart rate of 88.  On arrival to the ED heart rate elevated again to 166.  He remained in SVT despite an additional 6 mg of adenosine and then underwent attempted electrocardioversion in the ED.  Cardiology was consulted.  He was started on Cardizem drip.  Echocardiogram was ordered.  He converted back to NSR as able to be transitioned to Cardizem p.o.  He is medically stable to discharge with outpatient Zio patch and EP follow-up.  Unfortunately, the patient is very physically deconditioned in part due to his severe osteoarthritis and therapy has recommended SNF placement.  Subjective: No acute events overnight.  Has no acute complaints today.    Objective: Vitals:   10/21/23 2100 10/22/23 0446 10/22/23 0500 10/22/23 0726  BP: 108/72 122/71  123/70  Pulse:  62  (!) 56  Resp:  17  16  Temp:  98.3 F (36.8 C)    TempSrc:  Oral    SpO2:  95%  100%  Weight:   93.9 kg   Height:        Intake/Output Summary (Last 24 hours) at 10/22/2023 0832 Last data filed at 10/22/2023 0458 Gross per 24 hour  Intake 236 ml  Output 750 ml  Net -514 ml   Filed Weights   10/20/23 0500 10/21/23 0500 10/22/23 0500  Weight: 93.5 kg 92.6 kg 93.9 kg    Examination: General exam: Appears calm and comfortable, NAD  Respiratory system: No work of breathing, symmetric chest wall  expansion Cardiovascular system: S1 & S2 heard, RRR.  Gastrointestinal system: Abdomen is nondistended, soft and nontender.  Neuro: Alert and oriented. No focal neurological deficits. Extremities: Symmetric, expected ROM Skin: No rashes, lesions Psychiatry: Demonstrates appropriate judgement and insight. Mood & affect appropriate for situation.   Assessment & Plan:  Principal Problem:   SVT (supraventricular tachycardia) (HCC) Active Problems:   Non-insulin dependent type 2 diabetes mellitus (HCC)   Primary hypertension   Chest pain of uncertain etiology   AKI (acute kidney injury) (HCC)   CAP (community acquired pneumonia)   Paroxysmal atrial flutter (HCC)   Hypomagnesemia   Macrocytic anemia   Chronic diastolic CHF (congestive heart failure) (HCC)   SOB (shortness of breath)   Supraventricular tachycardia (HCC)    SVT History of atrial flutter - Not on nodal blocking agent on arrival - Not on anticoagulation outpatient - SVT initially refractory to 2 doses of adenosine but did resolve after Cardizem drip. - Cardiology consulted, now stabilized on Cardizem CD1 80 daily - Discontinued home amlodipine - Needs 2 weeks outpatient Zio patch - EP follow-up in 2 weeks - Continue to optimize electrolytes - Has been counseled on alcohol cessation - TSH 1.2  Chronic diastolic CHF - BNP elevated to 812 on arrival - Clinically euvolemic on exam - Chest x-ray does not indicate acute volume overload - BNP elevation may be secondary  to SVT - Echo EF 55 to 60%, no regional wall motion abnormalities, normal diastolic parameters - Coronary CTA performed 2/27 with minimally obstructive CAD.  No further workup recommended by cardiology  Community-acquired pneumonia - Chest x-ray with bronchitis changes and some improvement of left lung infiltrate - Respiratory viral panel negative -- Strep pneumo neg. - S/p Rocephin and doxycycline  AKI - Baseline creatinine 1.0, some worsening -  Avoid nephrotoxic meds - Renally dosed with a creatinine clearance of 57 - Trend CMP  Macrocytic anemia, resolved. B12 deficiency - Has had drop in hemoglobin.  Denies melena or hematochezia. - Not on anticoagulation/antiplatelets but does take naproxen. - B12 deficiency -- Suspect secondary to EtOH abuse - Status post B12 injection 1000 mcg x 2 followed by p.o. - Continue PPI - Hgb stable and rising  Hypertension - Normotensive - Continue Cardizem as above  Non-insulin-dependent type 2 diabetes with hyperglycemia - Hemoglobin A1c 5.8% - No need for insulin at this time  Chronic pain Osteoarthritis Ambulatory dysfunction - Previously on opiates outpatient but has not filled any since July 06, 2023 per PDMP - Is currently on Percocet, discussed with him this is not appropriate for long term pain control. -- Multimodal pain control. Scheduled tylenol, gabapentin, lidocaine patches. PRN percocet. NSAIDs limited by kidney function.  --Will change Zoloft to Cymbalta given better long term pain control. On slow taper of zoloft currently (was on 150mg  at home) currently 50mg , cont this and then discontinue and start duloxetine next week  - PT/OT recommending SNF - Needs to keep outpatient follow-up with orthopedic surgery for knee replacement workup  Dysphagia - Oropharyngeal dysphagia.  SLP eval ordered -Was on dysphasia diet previously, appears this has been advanced now.  Tobacco use disorder - Patient refuses nicotine patch at this time - Has been counseled on smoking cessation  EtOH abuse - Last drink 2/23 - Has been monitored for withdrawal, now out of withdrawal window - Multivitamin, thiamine, folic acid - TOC consulted for resource assistance  BMI 29 - Outpatient follow up for lifestyle modification and risk factor management   DVT prophylaxis: Lovenox   Code Status: Full Code Family Communication:  Discussed directly with patient Disposition:  Inpatient  still hospitalized for dispo planning and pain medication titration, will discharge to SNF/Rehab when this has been arranged by TOC. Medically cleared otherwise  Consultants:    Procedures:    Antimicrobials:  Anti-infectives (From admission, onward)    Start     Dose/Rate Route Frequency Ordered Stop   10/10/23 2100  doxycycline (VIBRAMYCIN) 100 mg in sodium chloride 0.9 % 250 mL IVPB  Status:  Discontinued        100 mg 125 mL/hr over 120 Minutes Intravenous Every 12 hours 10/10/23 2045 10/13/23 1056   10/10/23 2100  cefTRIAXone (ROCEPHIN) 1 g in sodium chloride 0.9 % 100 mL IVPB  Status:  Discontinued        1 g 200 mL/hr over 30 Minutes Intravenous Every 24 hours 10/10/23 2045 10/13/23 1056       Data Reviewed: I have personally reviewed following labs and imaging studies CBC: Recent Labs  Lab 10/20/23 0706 10/22/23 0713  WBC 4.2 4.0  NEUTROABS  --  1.5*  HGB 12.9* 13.0  HCT 39.9 40.7  MCV 100.3* 101.2*  PLT 240 251   Basic Metabolic Panel: Recent Labs  Lab 10/20/23 0706 10/21/23 0941  NA 138 138  K 5.0 4.4  CL 105 105  CO2 26 22  GLUCOSE 109* 139*  BUN 22 20  CREATININE 1.25* 1.36*  CALCIUM 9.5 9.5   GFR: Estimated Creatinine Clearance: 59.6 mL/min (A) (by C-G formula based on SCr of 1.36 mg/dL (H)). Liver Function Tests: Recent Labs  Lab 10/20/23 0706 10/21/23 0941  AST 18 22  ALT 12 12  ALKPHOS 47 44  BILITOT 0.6 0.7  PROT 6.9 7.1  ALBUMIN 3.1* 3.3*   CBG: Recent Labs  Lab 10/21/23 0850 10/21/23 1127 10/21/23 1615 10/21/23 1944 10/22/23 0726  GLUCAP 91 159* 117* 134* 108*    No results found for this or any previous visit (from the past 240 hours).    Radiology Studies: No results found.  Scheduled Meds:  acetaminophen  650 mg Oral Q6H   vitamin B-12  1,000 mcg Oral Daily   diltiazem  180 mg Oral Daily   enoxaparin (LOVENOX) injection  40 mg Subcutaneous Q24H   folic acid  1 mg Oral Daily   gabapentin  200 mg Oral TID    insulin aspart  0-6 Units Subcutaneous TID WC   lidocaine  2 patch Transdermal Q24H   multivitamin with minerals  1 tablet Oral Daily   pantoprazole  40 mg Oral Daily   sertraline  50 mg Oral Daily   thiamine  100 mg Oral Daily   traZODone  300 mg Oral QHS   Continuous Infusions:   LOS: 0 days    Total time spent interpreting labs and vitals, coordinating care amongst consultants and care team members, directly assessing and discussing care with the patient and/or family: 46 min   Debarah Crape, DO [facility]  To contact the attending physician between 7A-7P please use Epic Chat. To contact the covering physician during after hours 7P-7A, please review Amion.   10/22/2023, 8:32 AM   *This document has been created with the assistance of dictation software. Please excuse typographical errors. *

## 2023-10-22 NOTE — Plan of Care (Signed)
  Problem: Health Behavior/Discharge Planning: Goal: Ability to identify and utilize available resources and services will improve Outcome: Progressing Goal: Ability to manage health-related needs will improve Outcome: Progressing   Problem: Metabolic: Goal: Ability to maintain appropriate glucose levels will improve Outcome: Progressing   Problem: Skin Integrity: Goal: Risk for impaired skin integrity will decrease Outcome: Progressing   Problem: Tissue Perfusion: Goal: Adequacy of tissue perfusion will improve Outcome: Progressing   Problem: Education: Goal: Knowledge of General Education information will improve Description: Including pain rating scale, medication(s)/side effects and non-pharmacologic comfort measures Outcome: Progressing   Problem: Health Behavior/Discharge Planning: Goal: Ability to manage health-related needs will improve Outcome: Progressing

## 2023-10-23 DIAGNOSIS — J189 Pneumonia, unspecified organism: Secondary | ICD-10-CM | POA: Diagnosis not present

## 2023-10-23 DIAGNOSIS — N179 Acute kidney failure, unspecified: Secondary | ICD-10-CM | POA: Diagnosis not present

## 2023-10-23 DIAGNOSIS — I471 Supraventricular tachycardia, unspecified: Secondary | ICD-10-CM | POA: Diagnosis not present

## 2023-10-23 DIAGNOSIS — R079 Chest pain, unspecified: Secondary | ICD-10-CM | POA: Diagnosis not present

## 2023-10-23 LAB — CBC WITH DIFFERENTIAL/PLATELET
Abs Immature Granulocytes: 0 10*3/uL (ref 0.00–0.07)
Basophils Absolute: 0.1 10*3/uL (ref 0.0–0.1)
Basophils Relative: 1 %
Eosinophils Absolute: 0.3 10*3/uL (ref 0.0–0.5)
Eosinophils Relative: 7 %
HCT: 39.8 % (ref 39.0–52.0)
Hemoglobin: 13.2 g/dL (ref 13.0–17.0)
Immature Granulocytes: 0 %
Lymphocytes Relative: 49 %
Lymphs Abs: 1.8 10*3/uL (ref 0.7–4.0)
MCH: 32.8 pg (ref 26.0–34.0)
MCHC: 33.2 g/dL (ref 30.0–36.0)
MCV: 98.8 fL (ref 80.0–100.0)
Monocytes Absolute: 0.5 10*3/uL (ref 0.1–1.0)
Monocytes Relative: 13 %
Neutro Abs: 1.1 10*3/uL — ABNORMAL LOW (ref 1.7–7.7)
Neutrophils Relative %: 30 %
Platelets: 262 10*3/uL (ref 150–400)
RBC: 4.03 MIL/uL — ABNORMAL LOW (ref 4.22–5.81)
RDW: 15.2 % (ref 11.5–15.5)
WBC: 3.8 10*3/uL — ABNORMAL LOW (ref 4.0–10.5)
nRBC: 0 % (ref 0.0–0.2)

## 2023-10-23 LAB — GLUCOSE, CAPILLARY
Glucose-Capillary: 131 mg/dL — ABNORMAL HIGH (ref 70–99)
Glucose-Capillary: 118 mg/dL — ABNORMAL HIGH (ref 70–99)
Glucose-Capillary: 109 mg/dL — ABNORMAL HIGH (ref 70–99)
Glucose-Capillary: 111 mg/dL — ABNORMAL HIGH (ref 70–99)

## 2023-10-23 LAB — COMPREHENSIVE METABOLIC PANEL
ALT: 13 U/L (ref 0–44)
AST: 20 U/L (ref 15–41)
Albumin: 3.1 g/dL — ABNORMAL LOW (ref 3.5–5.0)
Alkaline Phosphatase: 41 U/L (ref 38–126)
Anion gap: 12 (ref 5–15)
BUN: 21 mg/dL (ref 8–23)
CO2: 23 mmol/L (ref 22–32)
Calcium: 10 mg/dL (ref 8.9–10.3)
Chloride: 103 mmol/L (ref 98–111)
Creatinine, Ser: 1.12 mg/dL (ref 0.61–1.24)
GFR, Estimated: >60 mL/min (ref >60)
Glucose, Bld: 116 mg/dL — ABNORMAL HIGH (ref 70–99)
Potassium: 4.2 mmol/L (ref 3.5–5.1)
Sodium: 138 mmol/L (ref 135–145)
Total Bilirubin: 0.3 mg/dL (ref 0.0–1.2)
Total Protein: 6.4 g/dL — ABNORMAL LOW (ref 6.5–8.1)

## 2023-10-23 LAB — MAGNESIUM: Magnesium: 1.8 mg/dL (ref 1.7–2.4)

## 2023-10-23 LAB — PHOSPHORUS: Phosphorus: 3.7 mg/dL (ref 2.5–4.6)

## 2023-10-23 NOTE — Progress Notes (Signed)
 PROGRESS NOTE    [name].  ZOX:[id number] DOB: 1956-07-22 DOA: 10/10/2023 PCP: Blima Singer, PA  Chief Complaint  Patient presents with   Tachycardia    Hospital Course:  Todd [name]. is 68 y.o. male with heart failure, type 2 diabetes, hypertension, pulmonary embolisms not on anticoagulation, recent pneumonia, anxiety, depression osteoarthritis, who was brought to the ED from his doctor's office with SVT, heart rate in the 170s on arrival.  Patient has receiving a preoperative evaluation for knee replacement at the time of this visit.  He was given 6 mg of adenosine by EMS converted to NSR with a heart rate of 88.  On arrival to the ED heart rate elevated again to 166.  He remained in SVT despite an additional 6 mg of adenosine and then underwent attempted electrocardioversion in the ED.  Cardiology was consulted.  He was started on Cardizem drip.  Echocardiogram was ordered.  He converted back to NSR as able to be transitioned to Cardizem p.o.  He is medically stable to discharge with outpatient Zio patch and EP follow-up.  Unfortunately, the patient is very physically deconditioned in part due to his severe osteoarthritis and therapy has recommended SNF placement.  Subjective: No acute events overnight.   Pt is eating breakfast and has not acute complaints.      Objective: Vitals:   10/22/23 1954 10/23/23 0403 10/23/23 0500 10/23/23 0800  BP: 135/76 110/84  121/70  Pulse: 65 61  62  Resp: 18 18  18   Temp: 98 F (36.7 C) 98.3 F (36.8 C)  98.1 F (36.7 C)  TempSrc: Oral Oral  Oral  SpO2: 94% 95%  96%  Weight:   95.4 kg   Height:        Intake/Output Summary (Last 24 hours) at 10/23/2023 8119 Last data filed at 10/22/2023 2150 Gross per 24 hour  Intake --  Output 700 ml  Net -700 ml   Filed Weights   10/21/23 0500 10/22/23 0500 10/23/23 0500  Weight: 92.6 kg 93.9 kg 95.4 kg    Examination: General exam: Appears calm and comfortable, NAD   Respiratory system: No work of breathing, symmetric chest wall expansion Cardiovascular system: S1 & S2 heard, RRR.  Gastrointestinal system: Abdomen is nondistended, soft and nontender.  Neuro: Alert and oriented. No focal neurological deficits. Extremities: Symmetric, expected ROM Skin: No rashes, lesions Psychiatry: Demonstrates appropriate judgement and insight. Mood & affect appropriate for situation.   Assessment & Plan:  Principal Problem:   SVT (supraventricular tachycardia) (HCC) Active Problems:   Non-insulin dependent type 2 diabetes mellitus (HCC)   Primary hypertension   Chest pain of uncertain etiology   AKI (acute kidney injury) (HCC)   CAP (community acquired pneumonia)   Paroxysmal atrial flutter (HCC)   Hypomagnesemia   Macrocytic anemia   Chronic diastolic CHF (congestive heart failure) (HCC)   SOB (shortness of breath)   Supraventricular tachycardia (HCC)    SVT History of atrial flutter - Not on nodal blocking agent on arrival - Not on anticoagulation outpatient - SVT initially refractory to 2 doses of adenosine but did resolve after Cardizem drip. - Cardiology consulted, now stabilized on Cardizem CD1 80 daily - Discontinued home amlodipine - Needs 2 weeks outpatient Zio patch - EP follow-up in 2 weeks - Continue to optimize electrolytes - Has been counseled on alcohol cessation - TSH 1.2  Chronic diastolic CHF - BNP elevated to 812 on arrival - Clinically euvolemic on exam - Chest x-ray does  not indicate acute volume overload - BNP elevation may be secondary to SVT - Echo EF 55 to 60%, no regional wall motion abnormalities, normal diastolic parameters - Coronary CTA performed 2/27 with minimally obstructive CAD.  No further workup recommended by cardiology  Community-acquired pneumonia - Chest x-ray with bronchitis changes and some improvement of left lung infiltrate - Respiratory viral panel negative -- Strep pneumo neg. - S/p Rocephin and  doxycycline  AKI - Baseline creatinine 1.0, some worsening - Avoid nephrotoxic meds - Renally dosed with a creatinine clearance of 57 - Trend CMP  Macrocytic anemia, resolved. B12 deficiency --Denies melena or hematochezia. - Not on anticoagulation/antiplatelets but does take naproxen. - B12 deficiency -- Suspect secondary to EtOH abuse - Status post B12 injection 1000 mcg x 2 followed by p.o. - Continue PPI - Hgb stable and rising  Hypertension - Normotensive - Continue Cardizem as above  Non-insulin-dependent type 2 diabetes with hyperglycemia - Hemoglobin A1c 5.8% - No need for insulin at this time  Chronic pain Osteoarthritis Ambulatory dysfunction - Previously on opiates outpatient but has not filled any since July 06, 2023 per PDMP - Is currently on Percocet, discussed with him this is not appropriate for long term pain control. -- Multimodal pain control. Scheduled tylenol, gabapentin, lidocaine patches. PRN percocet. NSAIDs limited by kidney function.  --Will change Zoloft to Cymbalta given better long term pain control. On slow taper of zoloft currently (was on 150mg  at home) currently 50mg , cont this and then discontinue and start duloxetine next week  - PT/OT recommending SNF - Needs to keep outpatient follow-up with orthopedic surgery for knee replacement workup  Dysphagia - Oropharyngeal dysphagia.  SLP eval ordered -Was on dysphasia diet previously, appears this has been advanced now.  Tobacco use disorder - Patient refuses nicotine patch at this time - Has been counseled on smoking cessation  EtOH abuse - Last drink 2/23 - Has been monitored for withdrawal, now out of withdrawal window - Multivitamin, thiamine, folic acid - TOC consulted for resource assistance  BMI 29 - Outpatient follow up for lifestyle modification and risk factor management   DVT prophylaxis: Lovenox   Code Status: Full Code Family Communication:  Discussed directly with  patient Disposition:  Inpatient still hospitalized for dispo planning and pain medication titration, will discharge to SNF/Rehab when this has been arranged by TOC. Medically cleared otherwise  Consultants:    Procedures:    Antimicrobials:  Anti-infectives (From admission, onward)    Start     Dose/Rate Route Frequency Ordered Stop   10/10/23 2100  doxycycline (VIBRAMYCIN) 100 mg in sodium chloride 0.9 % 250 mL IVPB  Status:  Discontinued        100 mg 125 mL/hr over 120 Minutes Intravenous Every 12 hours 10/10/23 2045 10/13/23 1056   10/10/23 2100  cefTRIAXone (ROCEPHIN) 1 g in sodium chloride 0.9 % 100 mL IVPB  Status:  Discontinued        1 g 200 mL/hr over 30 Minutes Intravenous Every 24 hours 10/10/23 2045 10/13/23 1056       Data Reviewed: I have personally reviewed following labs and imaging studies CBC: Recent Labs  Lab 10/20/23 0706 10/22/23 0713  WBC 4.2 4.0  NEUTROABS  --  1.5*  HGB 12.9* 13.0  HCT 39.9 40.7  MCV 100.3* 101.2*  PLT 240 251   Basic Metabolic Panel: Recent Labs  Lab 10/20/23 0706 10/21/23 0941 10/22/23 0713  NA 138 138 138  K 5.0 4.4 4.5  CL 105 105 105  CO2 26 22 24   GLUCOSE 109* 139* 103*  BUN 22 20 23   CREATININE 1.25* 1.36* 1.40*  CALCIUM 9.5 9.5 9.3  MG  --   --  1.9  PHOS  --   --  3.7   GFR: Estimated Creatinine Clearance: 58.4 mL/min (A) (by C-G formula based on SCr of 1.4 mg/dL (H)). Liver Function Tests: Recent Labs  Lab 10/20/23 0706 10/21/23 0941 10/22/23 0713  AST 18 22 22   ALT 12 12 10   ALKPHOS 47 44 41  BILITOT 0.6 0.7 0.6  PROT 6.9 7.1 6.8  ALBUMIN 3.1* 3.3* 3.1*   CBG: Recent Labs  Lab 10/21/23 1944 10/22/23 0726 10/22/23 1125 10/22/23 1635 10/22/23 2013  GLUCAP 134* 108* 110* 85 93    No results found for this or any previous visit (from the past 240 hours).    Radiology Studies: No results found.  Scheduled Meds:  acetaminophen  650 mg Oral Q6H   vitamin B-12  1,000 mcg Oral Daily    diltiazem  180 mg Oral Daily   enoxaparin (LOVENOX) injection  40 mg Subcutaneous Q24H   folic acid  1 mg Oral Daily   gabapentin  200 mg Oral TID   insulin aspart  0-6 Units Subcutaneous TID WC   lidocaine  2 patch Transdermal Q24H   multivitamin with minerals  1 tablet Oral Daily   pantoprazole  40 mg Oral Daily   sertraline  50 mg Oral Daily   thiamine  100 mg Oral Daily   traZODone  300 mg Oral QHS   Continuous Infusions:   LOS: 0 days    Total time spent interpreting labs and vitals, coordinating care amongst consultants and care team members, directly assessing and discussing care with the patient and/or family: 46 min   Debarah Crape, DO [facility]  To contact the attending physician between 7A-7P please use Epic Chat. To contact the covering physician during after hours 7P-7A, please review Amion.   10/23/2023, 8:52 AM   *This document has been created with the assistance of dictation software. Please excuse typographical errors. *

## 2023-10-23 NOTE — Plan of Care (Signed)
  Problem: Education: Goal: Ability to describe self-care measures that may prevent or decrease complications (Diabetes Survival Skills Education) will improve Outcome: Progressing Goal: Individualized Educational Video(s) Outcome: Progressing   Problem: Coping: Goal: Ability to adjust to condition or change in health will improve Outcome: Progressing   Problem: Health Behavior/Discharge Planning: Goal: Ability to identify and utilize available resources and services will improve Outcome: Progressing Goal: Ability to manage health-related needs will improve Outcome: Progressing   Problem: Metabolic: Goal: Ability to maintain appropriate glucose levels will improve Outcome: Progressing   Problem: Nutritional: Goal: Maintenance of adequate nutrition will improve Outcome: Progressing Goal: Progress toward achieving an optimal weight will improve Outcome: Progressing

## 2023-10-24 DIAGNOSIS — J189 Pneumonia, unspecified organism: Secondary | ICD-10-CM | POA: Diagnosis not present

## 2023-10-24 DIAGNOSIS — R079 Chest pain, unspecified: Secondary | ICD-10-CM | POA: Diagnosis not present

## 2023-10-24 DIAGNOSIS — I471 Supraventricular tachycardia, unspecified: Secondary | ICD-10-CM | POA: Diagnosis not present

## 2023-10-24 DIAGNOSIS — N179 Acute kidney failure, unspecified: Secondary | ICD-10-CM | POA: Diagnosis not present

## 2023-10-24 LAB — COMPREHENSIVE METABOLIC PANEL
ALT: 13 U/L (ref 0–44)
AST: 19 U/L (ref 15–41)
Albumin: 3 g/dL — ABNORMAL LOW (ref 3.5–5.0)
Alkaline Phosphatase: 43 U/L (ref 38–126)
Anion gap: 6 (ref 5–15)
BUN: 22 mg/dL (ref 8–23)
CO2: 24 mmol/L (ref 22–32)
Calcium: 9.6 mg/dL (ref 8.9–10.3)
Chloride: 108 mmol/L (ref 98–111)
Creatinine, Ser: 1.29 mg/dL — ABNORMAL HIGH (ref 0.61–1.24)
GFR, Estimated: >60 mL/min (ref >60)
Glucose, Bld: 108 mg/dL — ABNORMAL HIGH (ref 70–99)
Potassium: 4.7 mmol/L (ref 3.5–5.1)
Sodium: 138 mmol/L (ref 135–145)
Total Bilirubin: 0.5 mg/dL (ref 0.0–1.2)
Total Protein: 6.9 g/dL (ref 6.5–8.1)

## 2023-10-24 LAB — CBC WITH DIFFERENTIAL/PLATELET
Abs Immature Granulocytes: 0 10*3/uL (ref 0.00–0.07)
Basophils Absolute: 0.1 10*3/uL (ref 0.0–0.1)
Basophils Relative: 1 %
Eosinophils Absolute: 0.2 10*3/uL (ref 0.0–0.5)
Eosinophils Relative: 6 %
HCT: 41.3 % (ref 39.0–52.0)
Hemoglobin: 13.4 g/dL (ref 13.0–17.0)
Immature Granulocytes: 0 %
Lymphocytes Relative: 49 %
Lymphs Abs: 1.8 10*3/uL (ref 0.7–4.0)
MCH: 32.5 pg (ref 26.0–34.0)
MCHC: 32.4 g/dL (ref 30.0–36.0)
MCV: 100.2 fL — ABNORMAL HIGH (ref 80.0–100.0)
Monocytes Absolute: 0.4 10*3/uL (ref 0.1–1.0)
Monocytes Relative: 10 %
Neutro Abs: 1.2 10*3/uL — ABNORMAL LOW (ref 1.7–7.7)
Neutrophils Relative %: 34 %
Platelets: 254 10*3/uL (ref 150–400)
RBC: 4.12 MIL/uL — ABNORMAL LOW (ref 4.22–5.81)
RDW: 15.2 % (ref 11.5–15.5)
WBC: 3.6 10*3/uL — ABNORMAL LOW (ref 4.0–10.5)
nRBC: 0 % (ref 0.0–0.2)

## 2023-10-24 LAB — GLUCOSE, CAPILLARY
Glucose-Capillary: 143 mg/dL — ABNORMAL HIGH (ref 70–99)
Glucose-Capillary: 135 mg/dL — ABNORMAL HIGH (ref 70–99)
Glucose-Capillary: 137 mg/dL — ABNORMAL HIGH (ref 70–99)
Glucose-Capillary: 109 mg/dL — ABNORMAL HIGH (ref 70–99)

## 2023-10-24 LAB — MAGNESIUM: Magnesium: 1.7 mg/dL (ref 1.7–2.4)

## 2023-10-24 LAB — PHOSPHORUS: Phosphorus: 3.4 mg/dL (ref 2.5–4.6)

## 2023-10-24 MED ORDER — DULOXETINE HCL 20 MG PO CPEP
40.0000 mg | ORAL_CAPSULE | Freq: Every day | ORAL | Status: DC
  Administered 2023-10-25 – 2023-10-28 (×4): 40 mg via ORAL
  Filled 2023-10-24 (×4): qty 2

## 2023-10-24 NOTE — TOC Progression Note (Signed)
 Transition of Care (TOC) - Progression Note    Patient Details  Name: Todd [name]. MRN: [id number] Date of Birth: 05-Mar-1956  Transition of Care [facility]) CM/SW Contact  Sadonna Kotara A [place], LCSW Phone Number: 10/24/2023, 1:38 PM  Clinical Narrative:     CSW met with pt at bedside. He stated that he was unable to go back to his boarding house, the building owner said that pt had too many physical limitations to be able to return.   He stated that he has not been contacted by [facility] and that he may have spoken with someone form [facility], but is unsure.   CSW to follow up with facilities and continue to find placement for pt.    TOC will continue to follow.        Expected Discharge Plan and Services                                               Social Determinants of Health (SDOH) Interventions SDOH Screenings   Food Insecurity: Food Insecurity Present (10/11/2023)  Housing: High Risk (10/11/2023)  Transportation Needs: Unmet Transportation Needs (10/11/2023)  Utilities: Not At Risk (10/11/2023)  Alcohol Screen: Low Risk  (04/20/2023)  Financial Resource Strain: Low Risk  (10/27/2022)   Received from [facility]  Physical Activity: Inactive (10/27/2022)   Received from [facility]  Social Connections: Socially Isolated (10/11/2023)  Stress: Stress Concern Present (10/27/2022)   Received from [facility]  Tobacco Use: High Risk (10/10/2023)  Health Literacy: Low Risk  (10/27/2022)   Received from [facility]    Readmission Risk Interventions    10/11/2023    3:09 PM 09/02/2021    1:12 PM  Readmission Risk Prevention Plan  Transportation Screening Complete Complete  PCP or Specialist Appt within 3-5 Days Complete   HRI or Home Care Consult Complete Complete  Social Work Consult for Recovery Care Planning/Counseling Complete Complete  Palliative Care Screening Complete Not Applicable  Medication Review [employer]) Complete

## 2023-10-24 NOTE — Progress Notes (Signed)
 PROGRESS NOTE    [name].  ZOX:[id number] DOB: 09-Dec-1955 DOA: 10/10/2023 PCP: Blima Singer, PA  Chief Complaint  Patient presents with   Tachycardia    Hospital Course:  Todd [name]. is 68 y.o. male with heart failure, type 2 diabetes, hypertension, pulmonary embolisms not on anticoagulation, recent pneumonia, anxiety, depression osteoarthritis, who was brought to the ED from his doctor's office with SVT, heart rate in the 170s on arrival.  Patient has receiving a preoperative evaluation for knee replacement at the time of this visit.  He was given 6 mg of adenosine by EMS converted to NSR with a heart rate of 88.  On arrival to the ED heart rate elevated again to 166.  He remained in SVT despite an additional 6 mg of adenosine and then underwent attempted electrocardioversion in the ED.  Cardiology was consulted.  He was started on Cardizem drip.  Echocardiogram was ordered.  He converted back to NSR as able to be transitioned to Cardizem p.o.  He is medically stable to discharge with outpatient Zio patch and EP follow-up.  Unfortunately, the patient is very physically deconditioned in part due to his severe osteoarthritis and therapy has recommended SNF placement.  Subjective: No acute events overnight. On evaluation today patient reports he worked well with PT. He is feeling encouraged to get to Rehab.    Objective: Vitals:   10/23/23 1647 10/23/23 1951 10/24/23 0416 10/24/23 0500  BP: 112/80 115/74 134/87   Pulse: 70 (!) 53 (!) 59   Resp: 17 20 18    Temp:  99 F (37.2 C) 99.2 F (37.3 C)   TempSrc:  Oral Oral   SpO2: 97% 94% 96%   Weight:    96.3 kg  Height:        Intake/Output Summary (Last 24 hours) at 10/24/2023 0843 Last data filed at 10/24/2023 0519 Gross per 24 hour  Intake --  Output 1900 ml  Net -1900 ml   Filed Weights   10/22/23 0500 10/23/23 0500 10/24/23 0500  Weight: 93.9 kg 95.4 kg 96.3 kg    Examination: General exam: Appears  calm and comfortable, NAD  Respiratory system: No work of breathing, symmetric chest wall expansion Cardiovascular system: S1 & S2 heard, RRR.  Gastrointestinal system: Abdomen is nondistended, soft and nontender.  Neuro: Alert and oriented. No focal neurological deficits. Extremities: Symmetric, expected ROM Skin: No rashes, lesions Psychiatry: Demonstrates appropriate judgement and insight. Mood & affect appropriate for situation.   Assessment & Plan:  Principal Problem:   SVT (supraventricular tachycardia) (HCC) Active Problems:   Non-insulin dependent type 2 diabetes mellitus (HCC)   Primary hypertension   Chest pain of uncertain etiology   AKI (acute kidney injury) (HCC)   CAP (community acquired pneumonia)   Paroxysmal atrial flutter (HCC)   Hypomagnesemia   Macrocytic anemia   Chronic diastolic CHF (congestive heart failure) (HCC)   SOB (shortness of breath)   Supraventricular tachycardia (HCC)    SVT History of atrial flutter - Not on nodal blocking agent on arrival - Not on anticoagulation outpatient - SVT initially refractory to 2 doses of adenosine but did resolve after Cardizem drip. - Cardiology consulted, now stabilized on Cardizem CD1 80 daily - Discontinued home amlodipine - Needs 2 weeks outpatient Zio patch - EP follow-up in 2 weeks - Continue to optimize electrolytes - Has been counseled on alcohol cessation - TSH 1.2  Chronic diastolic CHF - BNP elevated to 812 on arrival - Clinically euvolemic on exam  now - Chest x-ray does not indicate acute volume overload - BNP elevation may be secondary to SVT - Echo EF 55 to 60%, no regional wall motion abnormalities, normal diastolic parameters - Coronary CTA performed 2/27 with minimally obstructive CAD.  No further workup recommended by cardiology  Community-acquired pneumonia - Chest x-ray with bronchitis changes and some improvement of left lung infiltrate - Respiratory viral panel negative -- Strep  pneumo neg. - S/p Rocephin and doxycycline  AKI - Baseline creatinine 1.0, some worsening. Monitoring closely. Titrating med accordingly.  - Avoid nephrotoxic meds - Renally dosed with a creatinine clearance of 57 - Trend CMP  Macrocytic anemia, resolved. B12 deficiency --Denies melena or hematochezia. - Not on anticoagulation/antiplatelets but does take naproxen at home. - B12 deficiency -- Suspect secondary to EtOH abuse - Status post B12 injection 1000 mcg x 2 followed by p.o. - Continue PPI - Hgb stable and rising  Hypertension - Normotensive - Continue Cardizem as above  Non-insulin-dependent type 2 diabetes with hyperglycemia - Hemoglobin A1c 5.8% - No need for insulin at this time  Chronic pain Osteoarthritis Ambulatory dysfunction - Previously on opiates outpatient but has not filled any since July 06, 2023 per PDMP - Is currently on Percocet, discussed with him this is not appropriate for long term pain control. -- Multimodal pain control. Scheduled tylenol, gabapentin, lidocaine patches. PRN percocet. NSAIDs limited by kidney function.  --Will change Zoloft to Cymbalta given better long term pain control. On slow taper of zoloft currently (was on 150mg  at home) currently 50mg , will DC now and start Cymbalta tomorrow.  - PT/OT recommending SNF - Needs to keep outpatient follow-up with orthopedic surgery for knee replacement workup  Dysphagia - Oropharyngeal dysphagia.  SLP eval ordered -Was on dysphasia diet previously, appears this has been advanced now.  Tobacco use disorder - Patient refuses nicotine patch at this time - Has been counseled on smoking cessation  EtOH abuse - Last drink 2/23 - Has been monitored for withdrawal, now out of withdrawal window - Multivitamin, thiamine, folic acid - TOC consulted for resource assistance  BMI 29 - Outpatient follow up for lifestyle modification and risk factor management   DVT prophylaxis: Lovenox    Code Status: Full Code Family Communication:  Discussed directly with patient Disposition:  Inpatient still hospitalized for dispo planning and pain medication titration, will discharge to SNF/Rehab when this has been arranged by TOC. Medically cleared otherwise  Consultants:    Procedures:    Antimicrobials:  Anti-infectives (From admission, onward)    Start     Dose/Rate Route Frequency Ordered Stop   10/10/23 2100  doxycycline (VIBRAMYCIN) 100 mg in sodium chloride 0.9 % 250 mL IVPB  Status:  Discontinued        100 mg 125 mL/hr over 120 Minutes Intravenous Every 12 hours 10/10/23 2045 10/13/23 1056   10/10/23 2100  cefTRIAXone (ROCEPHIN) 1 g in sodium chloride 0.9 % 100 mL IVPB  Status:  Discontinued        1 g 200 mL/hr over 30 Minutes Intravenous Every 24 hours 10/10/23 2045 10/13/23 1056       Data Reviewed: I have personally reviewed following labs and imaging studies CBC: Recent Labs  Lab 10/20/23 0706 10/22/23 0713 10/23/23 0851 10/24/23 0741  WBC 4.2 4.0 3.8* 3.6*  NEUTROABS  --  1.5* 1.1* 1.2*  HGB 12.9* 13.0 13.2 13.4  HCT 39.9 40.7 39.8 41.3  MCV 100.3* 101.2* 98.8 100.2*  PLT 240 251 262 254  Basic Metabolic Panel: Recent Labs  Lab 10/20/23 0706 10/21/23 0941 10/22/23 0713 10/23/23 0851  NA 138 138 138 138  K 5.0 4.4 4.5 4.2  CL 105 105 105 103  CO2 26 22 24 23   GLUCOSE 109* 139* 103* 116*  BUN 22 20 23 21   CREATININE 1.25* 1.36* 1.40* 1.12  CALCIUM 9.5 9.5 9.3 10.0  MG  --   --  1.9 1.8  PHOS  --   --  3.7 3.7   GFR: Estimated Creatinine Clearance: 73.2 mL/min (by C-G formula based on SCr of 1.12 mg/dL). Liver Function Tests: Recent Labs  Lab 10/20/23 0706 10/21/23 0941 10/22/23 0713 10/23/23 0851  AST 18 22 22 20   ALT 12 12 10 13   ALKPHOS 47 44 41 41  BILITOT 0.6 0.7 0.6 0.3  PROT 6.9 7.1 6.8 6.4*  ALBUMIN 3.1* 3.3* 3.1* 3.1*   CBG: Recent Labs  Lab 10/23/23 0855 10/23/23 1151 10/23/23 1642 10/23/23 1952 10/24/23 0816   GLUCAP 131* 118* 109* 111* 143*    No results found for this or any previous visit (from the past 240 hours).    Radiology Studies: No results found.  Scheduled Meds:  acetaminophen  650 mg Oral Q6H   vitamin B-12  1,000 mcg Oral Daily   diltiazem  180 mg Oral Daily   enoxaparin (LOVENOX) injection  40 mg Subcutaneous Q24H   folic acid  1 mg Oral Daily   gabapentin  200 mg Oral TID   insulin aspart  0-6 Units Subcutaneous TID WC   lidocaine  2 patch Transdermal Q24H   multivitamin with minerals  1 tablet Oral Daily   pantoprazole  40 mg Oral Daily   sertraline  50 mg Oral Daily   thiamine  100 mg Oral Daily   traZODone  300 mg Oral QHS   Continuous Infusions:   LOS: 0 days    Total time spent interpreting labs and vitals, coordinating care amongst consultants and care team members, directly assessing and discussing care with the patient and/or family: 46 min   Debarah Crape, DO [facility]  To contact the attending physician between 7A-7P please use Epic Chat. To contact the covering physician during after hours 7P-7A, please review Amion.   10/24/2023, 8:43 AM   *This document has been created with the assistance of dictation software. Please excuse typographical errors. *

## 2023-10-24 NOTE — Plan of Care (Signed)

## 2023-10-24 NOTE — Progress Notes (Signed)
 Physical Therapy Treatment Patient Details Name: Todd [name]. MRN: [id number] DOB: 03/27/1956 Today's Date: 10/24/2023   History of Present Illness 67 y.o. male admitted 10/10/2023 with SVT. Pt diagnosed with CAP 10/08/23. PMHx: PAF, HTN, chronic diastolic HF, bipolar disorder, RBBB, BPPV, MDD, substance abuse, DM    PT Comments  Pt pleasant and very agreeable to instruction and HEP progression this session. [name] walked in hall with assist and use of rollator with bil knee sleeves donned. Pt educated for quad strengthening and need to continue to progress function. Will follow.      If plan is discharge home, recommend the following: Assistance with cooking/housework;Assist for transportation;Help with stairs or ramp for entrance;Direct supervision/assist for medications management;A little help with walking and/or transfers;A little help with bathing/dressing/bathroom   Can travel by private vehicle     Yes  Equipment Recommendations  Wheelchair (measurements PT);Wheelchair cushion (measurements PT);Rollator (4 wheels)    Recommendations for Other Services       Precautions / Restrictions Precautions Precautions: Fall Recall of Precautions/Restrictions: Impaired Precaution/Restrictions Comments: knee pain, prefers bil knee sleeves     Mobility  Bed Mobility Overal bed mobility: Modified Independent                  Transfers Overall transfer level: Needs assistance   Transfers: Sit to/from Stand Sit to Stand: Contact guard assist           General transfer comment: CGA to rise from bed and mod cues to safely descend to surface as he initially tried to sit prematurely on armrest. assist to block and control rollator since brakes don't work    Ambulation/Gait Ambulation/Gait assistance: Editor, commissioning (Feet): 45 Feet Assistive device: Rollator (4 wheels) Gait Pattern/deviations: Step-through pattern, Decreased stride length, Trunk flexed   Gait  velocity interpretation: 1.31 - 2.62 ft/sec, indicative of limited community ambulator   General Gait Details: pt with increased gait tolerance with cues for posture, proximity to rollator and safety   Stairs             Wheelchair Mobility     Tilt Bed    Modified Rankin (Stroke Patients Only)       Balance Overall balance assessment: Needs assistance Sitting-balance support: Feet supported Sitting balance-Leahy Scale: Good     Standing balance support: Reliant on assistive device for balance, Bilateral upper extremity supported, During functional activity Standing balance-Leahy Scale: Poor Standing balance comment: Rollator reliant                            Communication Communication Communication: No apparent difficulties  Cognition Arousal: Alert Behavior During Therapy: WFL for tasks assessed/performed   PT - Cognitive impairments: Safety/Judgement, Awareness, Attention, Problem solving, No family/caregiver present to determine baseline                       PT - Cognition Comments: pt with decreased awareness of safety and progression, accepting of instruction this session Following commands: Impaired Following commands impaired: Follows one step commands with increased time    Cueing Cueing Techniques: Verbal cues, Tactile cues  Exercises General Exercises - Lower Extremity Quad Sets: AROM, Right, Seated, 5 reps Long Arc Quad: AROM, Both, 15 reps, Seated Straight Leg Raises: AROM, Left, Seated, 5 reps Hip Flexion/Marching: AROM, Both, 15 reps, Seated    General Comments        Pertinent Vitals/Pain Pain Assessment Pain Assessment:  0-10 Pain Score: 5  Pain Location: bil knees after mobility Pain Descriptors / Indicators: Aching, Sore Pain Intervention(s): Limited activity within patient's tolerance, Monitored during session, Premedicated before session, Repositioned    Home Living                           Prior Function            PT Goals (current goals can now be found in the care plan section) Progress towards PT goals: Progressing toward goals    Frequency    Min 2X/week      PT Plan      Co-evaluation              AM-PAC PT "6 Clicks" Mobility   Outcome Measure  Help needed turning from your back to your side while in a flat bed without using bedrails?: None Help needed moving from lying on your back to sitting on the side of a flat bed without using bedrails?: None Help needed moving to and from a bed to a chair (including a wheelchair)?: A Little Help needed standing up from a chair using your arms (e.g., wheelchair or bedside chair)?: A Little Help needed to walk in hospital room?: A Little Help needed climbing 3-5 steps with a railing? : Total 6 Click Score: 18    End of Session Equipment Utilized During Treatment: Gait belt Activity Tolerance: Patient tolerated treatment well Patient left: in chair;with call bell/phone within reach;with chair alarm set Nurse Communication: Mobility status PT Visit Diagnosis: Other abnormalities of gait and mobility (R26.89);Muscle weakness (generalized) (M62.81);History of falling (Z91.81)     Time: 1610-9604 PT Time Calculation (min) (ACUTE ONLY): 14 min  Charges:    $Therapeutic Activity: 8-22 mins PT General Charges $$ ACUTE PT VISIT: 1 Visit                     Merryl Hacker, PT Acute Rehabilitation Services Office: [phone]    Enedina Finner Chaya Dehaan 10/24/2023, 11:14 AM

## 2023-10-25 DIAGNOSIS — I471 Supraventricular tachycardia, unspecified: Secondary | ICD-10-CM | POA: Diagnosis not present

## 2023-10-25 DIAGNOSIS — J189 Pneumonia, unspecified organism: Secondary | ICD-10-CM | POA: Diagnosis not present

## 2023-10-25 DIAGNOSIS — R079 Chest pain, unspecified: Secondary | ICD-10-CM | POA: Diagnosis not present

## 2023-10-25 DIAGNOSIS — N179 Acute kidney failure, unspecified: Secondary | ICD-10-CM | POA: Diagnosis not present

## 2023-10-25 LAB — PHOSPHORUS: Phosphorus: 3.7 mg/dL (ref 2.5–4.6)

## 2023-10-25 LAB — CBC WITH DIFFERENTIAL/PLATELET
Abs Immature Granulocytes: 0 10*3/uL (ref 0.00–0.07)
Basophils Absolute: 0 10*3/uL (ref 0.0–0.1)
Basophils Relative: 1 %
Eosinophils Absolute: 0.3 10*3/uL (ref 0.0–0.5)
Eosinophils Relative: 7 %
HCT: 40.3 % (ref 39.0–52.0)
Hemoglobin: 13.3 g/dL (ref 13.0–17.0)
Immature Granulocytes: 0 %
Lymphocytes Relative: 46 %
Lymphs Abs: 1.9 10*3/uL (ref 0.7–4.0)
MCH: 33 pg (ref 26.0–34.0)
MCHC: 33 g/dL (ref 30.0–36.0)
MCV: 100 fL (ref 80.0–100.0)
Monocytes Absolute: 0.5 10*3/uL (ref 0.1–1.0)
Monocytes Relative: 11 %
Neutro Abs: 1.4 10*3/uL — ABNORMAL LOW (ref 1.7–7.7)
Neutrophils Relative %: 35 %
Platelets: 249 10*3/uL (ref 150–400)
RBC: 4.03 MIL/uL — ABNORMAL LOW (ref 4.22–5.81)
RDW: 15.4 % (ref 11.5–15.5)
WBC: 4.1 10*3/uL (ref 4.0–10.5)
nRBC: 0 % (ref 0.0–0.2)

## 2023-10-25 LAB — COMPREHENSIVE METABOLIC PANEL
ALT: 11 U/L (ref 0–44)
AST: 21 U/L (ref 15–41)
Albumin: 3 g/dL — ABNORMAL LOW (ref 3.5–5.0)
Alkaline Phosphatase: 41 U/L (ref 38–126)
Anion gap: 6 (ref 5–15)
BUN: 19 mg/dL (ref 8–23)
CO2: 26 mmol/L (ref 22–32)
Calcium: 9.8 mg/dL (ref 8.9–10.3)
Chloride: 105 mmol/L (ref 98–111)
Creatinine, Ser: 1.36 mg/dL — ABNORMAL HIGH (ref 0.61–1.24)
GFR, Estimated: 57 mL/min — ABNORMAL LOW (ref >60)
Glucose, Bld: 103 mg/dL — ABNORMAL HIGH (ref 70–99)
Potassium: 4.4 mmol/L (ref 3.5–5.1)
Sodium: 137 mmol/L (ref 135–145)
Total Bilirubin: 0.5 mg/dL (ref 0.0–1.2)
Total Protein: 6.8 g/dL (ref 6.5–8.1)

## 2023-10-25 LAB — MAGNESIUM: Magnesium: 1.7 mg/dL (ref 1.7–2.4)

## 2023-10-25 LAB — GLUCOSE, CAPILLARY
Glucose-Capillary: 164 mg/dL — ABNORMAL HIGH (ref 70–99)
Glucose-Capillary: 130 mg/dL — ABNORMAL HIGH (ref 70–99)
Glucose-Capillary: 111 mg/dL — ABNORMAL HIGH (ref 70–99)

## 2023-10-25 NOTE — Plan of Care (Signed)

## 2023-10-25 NOTE — TOC Progression Note (Signed)
 Transition of Care (TOC) - Progression Note    Patient Details  Name: Todd [name]. MRN: [id number] Date of Birth: 1956/03/27  Transition [facility] Care [facility]) CM/SW Contact  Todd Casselman A [place], LCSW Phone Number: 10/25/2023, 10:27 AM  Clinical Narrative:     CSW contacted pt's niece, Tica, regarding pt's challenge for placement. She stated that she is still hoping pt can go to short term rehab and is working to find him placement and will assure that pt will not stay at nursing facility. She stated CSW could share contact with [place] health care and speak to administration to assist with concerns for providing bed offer to pt. CSW followed up with [facility] care for possible reconsideration for placement, awaiting response.    TOC will continue to follow.       Expected Discharge Plan and Services                                               Social Determinants of Health (SDOH) Interventions SDOH Screenings   Food Insecurity: Food Insecurity Present (10/11/2023)  Housing: High Risk (10/11/2023)  Transportation Needs: Unmet Transportation Needs (10/11/2023)  Utilities: Not At Risk (10/11/2023)  Alcohol Screen: Low Risk  (04/20/2023)  Financial Resource Strain: Low Risk  (10/27/2022)   Received [facility]  Physical Activity: Inactive (10/27/2022)   Received from [facility]  Social Connections: Socially Isolated (10/11/2023)  Stress: Stress Concern Present (10/27/2022)   Received from [facility]  Tobacco Use: High Risk (10/10/2023)  Health Literacy: Low Risk  (10/27/2022)   Received from [facility]    Readmission Risk Interventions    10/11/2023    3:09 PM 09/02/2021    1:12 PM  Readmission Risk Prevention Plan  Transportation Screening Complete Complete  PCP or Specialist Appt within 3-5 Days Complete   HRI or Home Care Consult Complete Complete  Social Work Consult for Recovery Care Planning/Counseling Complete Complete  Palliative Care  Screening Complete Not Applicable  Medication Review Oceanographer) Complete

## 2023-10-25 NOTE — Progress Notes (Signed)
 PROGRESS NOTE    [name].  ZOX:[id number] DOB: [birth year] DOA: 10/10/2023 PCP: Blima Singer, PA  Chief Complaint  Patient presents with   Tachycardia    Hospital Course:  Todd [name]. is 68 y.o. male with heart failure, type 2 diabetes, hypertension, pulmonary embolisms not on anticoagulation, recent pneumonia, anxiety, depression osteoarthritis, who was brought to the ED from his doctor's office with SVT, heart rate in the 170s on arrival.  Patient has receiving a preoperative evaluation for knee replacement at the time of this visit.  He was given 6 mg of adenosine by EMS converted to NSR with a heart rate of 88.  On arrival to the ED heart rate elevated again to 166.  He remained in SVT despite an additional 6 mg of adenosine and then underwent attempted electrocardioversion in the ED.  Cardiology was consulted.  He was started on Cardizem drip.  Echocardiogram was ordered.  He converted back to NSR as able to be transitioned to Cardizem p.o.  He is medically stable to discharge with outpatient Zio patch and EP follow-up.  Unfortunately, the patient is very physically deconditioned in part due to his severe osteoarthritis and therapy has recommended SNF placement.  Subjective: No acute events overnight. Working with OT on arrival.    Objective: Vitals:   10/24/23 2123 10/25/23 0500 10/25/23 0523 10/25/23 0851  BP: 120/69  (!) 127/95 113/70  Pulse: 62  (!) 55 66  Resp: 18  18 18   Temp: 98.6 F (37 C)  98.7 F (37.1 C)   TempSrc: Oral  Oral   SpO2: 97%  96% 100%  Weight:  95.1 kg    Height:        Intake/Output Summary (Last 24 hours) at 10/25/2023 0934 Last data filed at 10/25/2023 0708 Gross per 24 hour  Intake --  Output 750 ml  Net -750 ml   Filed Weights   10/23/23 0500 10/24/23 0500 10/25/23 0500  Weight: 95.4 kg 96.3 kg 95.1 kg    Examination: General exam: Appears calm and comfortable, NAD  Respiratory system: No work of breathing,  symmetric chest wall expansion Cardiovascular system: S1 & S2 heard, RRR.  Gastrointestinal system: Abdomen is nondistended, soft and nontender.  Neuro: Alert and oriented. No focal neurological deficits. Extremities: Symmetric, expected ROM Skin: No rashes, lesions Psychiatry: Demonstrates appropriate judgement and insight. Mood & affect appropriate for situation.   Assessment & Plan:  Principal Problem:   SVT (supraventricular tachycardia) (HCC) Active Problems:   Non-insulin dependent type 2 diabetes mellitus (HCC)   Primary hypertension   Chest pain of uncertain etiology   AKI (acute kidney injury) (HCC)   CAP (community acquired pneumonia)   Paroxysmal atrial flutter (HCC)   Hypomagnesemia   Macrocytic anemia   Chronic diastolic CHF (congestive heart failure) (HCC)   SOB (shortness of breath)   Supraventricular tachycardia (HCC)    SVT History of atrial flutter - Not on nodal blocking agent on arrival - Not on anticoagulation outpatient - SVT initially refractory to 2 doses of adenosine but did resolve after Cardizem drip. - Cardiology consulted, now stabilized on Cardizem CD1 80 daily - Discontinued home amlodipine - Needs 2 weeks outpatient Zio patch - EP follow-up in 2 weeks - Continue to optimize electrolytes - Has been counseled on alcohol cessation - TSH 1.2  Chronic diastolic CHF - BNP elevated to 812 on arrival - Clinically euvolemic on exam now - Chest x-ray does not indicate acute volume overload - BNP elevation  may be secondary to SVT - Echo EF 55 to 60%, no regional wall motion abnormalities, normal diastolic parameters - Coronary CTA performed 2/27 with minimally obstructive CAD.  No further workup recommended by cardiology  Community-acquired pneumonia - Chest x-ray with bronchitis changes and some improvement of left lung infiltrate - Respiratory viral panel negative -- Strep pneumo neg. - S/p Rocephin and doxycycline  AKI -- suspect some  degree of CKD - Baseline creatinine 1.0, some worsening. Back off on gabapentin, no other offensive meds currently -- Monitoring closely. Titrating meds accordingly.  - Avoid nephrotoxic meds - Renally dosed with a creatinine clearance of 60 - Trend CMP  Macrocytic anemia, resolved. B12 deficiency --Denies melena or hematochezia. - Not on anticoagulation/antiplatelets but does take naproxen at home. - B12 deficiency -- Suspect secondary to EtOH abuse - Status post B12 injection 1000 mcg x 2 followed by p.o. - Continue PPI - Hgb stable and rising  Hypertension - Normotensive - Continue Cardizem as above  Non-insulin-dependent type 2 diabetes with hyperglycemia - Hemoglobin A1c 5.8% - No need for insulin at this time  Chronic pain Osteoarthritis Ambulatory dysfunction - Previously on opiates outpatient but has not filled any since July 06, 2023 per PDMP - Is currently on Percocet, discussed with him this is not appropriate for long term pain control. -- Multimodal pain control. Scheduled tylenol, lidocaine patches. PRN percocet. NSAIDs limited by kidney function.  --Will change Zoloft to Cymbalta given better long term pain control. Completed zoloft taper. Cymbalta initiated today, titrate up as tolerated - PT/OT recommending SNF - Needs to keep outpatient follow-up with orthopedic surgery for knee replacement workup  Dysphagia - Oropharyngeal dysphagia.  SLP eval ordered -Was on dysphasia diet previously, appears this has been advanced now.  Tobacco use disorder - Patient refuses nicotine patch at this time - Has been counseled on smoking cessation  EtOH abuse - Last drink 2/23 - Has been monitored for withdrawal, now out of withdrawal window - Multivitamin, thiamine, folic acid - TOC consulted for resource assistance  BMI 29 - Outpatient follow up for lifestyle modification and risk factor management   DVT prophylaxis: Lovenox   Code Status: Full Code Family  Communication:  Discussed directly with patient Disposition:  Inpatient still hospitalized for dispo planning, will discharge to SNF/Rehab/ALF when this has been arranged by TOC. Medically cleared otherwise  Consultants:    Procedures:    Antimicrobials:  Anti-infectives (From admission, onward)    Start     Dose/Rate Route Frequency Ordered Stop   10/10/23 2100  doxycycline (VIBRAMYCIN) 100 mg in sodium chloride 0.9 % 250 mL IVPB  Status:  Discontinued        100 mg 125 mL/hr over 120 Minutes Intravenous Every 12 hours 10/10/23 2045 10/13/23 1056   10/10/23 2100  cefTRIAXone (ROCEPHIN) 1 g in sodium chloride 0.9 % 100 mL IVPB  Status:  Discontinued        1 g 200 mL/hr over 30 Minutes Intravenous Every 24 hours 10/10/23 2045 10/13/23 1056       Data Reviewed: I have personally reviewed following labs and imaging studies CBC: Recent Labs  Lab 10/20/23 0706 10/22/23 0713 10/23/23 0851 10/24/23 0741 10/25/23 0555  WBC 4.2 4.0 3.8* 3.6* 4.1  NEUTROABS  --  1.5* 1.1* 1.2* 1.4*  HGB 12.9* 13.0 13.2 13.4 13.3  HCT 39.9 40.7 39.8 41.3 40.3  MCV 100.3* 101.2* 98.8 100.2* 100.0  PLT 240 251 262 254 249   Basic Metabolic Panel:  Recent Labs  Lab 10/21/23 0941 10/22/23 0713 10/23/23 0851 10/24/23 0741 10/25/23 0555  NA 138 138 138 138 137  K 4.4 4.5 4.2 4.7 4.4  CL 105 105 103 108 105  CO2 22 24 23 24 26   GLUCOSE 139* 103* 116* 108* 103*  BUN 20 23 21 22 19   CREATININE 1.36* 1.40* 1.12 1.29* 1.36*  CALCIUM 9.5 9.3 10.0 9.6 9.8  MG  --  1.9 1.8 1.7 1.7  PHOS  --  3.7 3.7 3.4 3.7   GFR: Estimated Creatinine Clearance: 60 mL/min (A) (by C-G formula based on SCr of 1.36 mg/dL (H)). Liver Function Tests: Recent Labs  Lab 10/21/23 0941 10/22/23 0713 10/23/23 0851 10/24/23 0741 10/25/23 0555  AST 22 22 20 19 21   ALT 12 10 13 13 11   ALKPHOS 44 41 41 43 41  BILITOT 0.7 0.6 0.3 0.5 0.5  PROT 7.1 6.8 6.4* 6.9 6.8  ALBUMIN 3.3* 3.1* 3.1* 3.0* 3.0*   CBG: Recent  Labs  Lab 10/24/23 0816 10/24/23 1251 10/24/23 1637 10/24/23 2125 10/25/23 0849  GLUCAP 143* 135* 137* 109* 164*    No results found for this or any previous visit (from the past 240 hours).    Radiology Studies: No results found.  Scheduled Meds:  acetaminophen  650 mg Oral Q6H   vitamin B-12  1,000 mcg Oral Daily   diltiazem  180 mg Oral Daily   DULoxetine  40 mg Oral Daily   enoxaparin (LOVENOX) injection  40 mg Subcutaneous Q24H   folic acid  1 mg Oral Daily   gabapentin  200 mg Oral TID   insulin aspart  0-6 Units Subcutaneous TID WC   lidocaine  2 patch Transdermal Q24H   multivitamin with minerals  1 tablet Oral Daily   pantoprazole  40 mg Oral Daily   thiamine  100 mg Oral Daily   traZODone  300 mg Oral QHS   Continuous Infusions:   LOS: 0 days    Total time spent interpreting labs and vitals, coordinating care amongst consultants and care team members, directly assessing and discussing care with the patient and/or family: 46 min   Debarah Crape, DO [facility]  To contact the attending physician between 7A-7P please use Epic Chat. To contact the covering physician during after hours 7P-7A, please review Amion.   10/25/2023, 9:34 AM   *This document has been created with the assistance of dictation software. Please excuse typographical errors. *

## 2023-10-25 NOTE — TOC Progression Note (Signed)
 Transition of Care (TOC) - Progression Note    Patient Details  Name: Todd [name]. MRN: [id number] Date of Birth: 11-01-1955  Transition of Care [facility]) CM/SW Contact  Janae Bridgeman, RN Phone Number: 10/25/2023, 3:26 PM  Clinical Narrative:    CM called and left a voice message with Caroline More, owner of ALF facilities in the area requested review of clinicals for possible bed offer.  Patient has no SNF offers at this time.        Expected Discharge Plan and Services                                               Social Determinants of Health (SDOH) Interventions SDOH Screenings   Food Insecurity: Food Insecurity Present (10/11/2023)  Housing: High Risk (10/11/2023)  Transportation Needs: Unmet Transportation Needs (10/11/2023)  Utilities: Not At Risk (10/11/2023)  Alcohol Screen: Low Risk  (04/20/2023)  Financial Resource Strain: Low Risk  (10/27/2022)   Received from [facility]  Physical Activity: Inactive (10/27/2022)   Received [facility]  Social Connections: Socially Isolated (10/11/2023)  Stress: Stress Concern Present (10/27/2022)   Received from [facility]  Tobacco Use: High Risk (10/10/2023)  Health Literacy: Low Risk  (10/27/2022)   Received from [facility]    Readmission Risk Interventions    10/11/2023    3:09 PM 09/02/2021    1:12 PM  Readmission Risk Prevention Plan  Transportation Screening Complete Complete  PCP or Specialist Appt within 3-5 Days Complete   HRI or Home Care Consult Complete Complete  Social Work Consult for Recovery Care Planning/Counseling Complete Complete  Palliative Care Screening Complete Not Applicable  Medication Review Oceanographer) Complete

## 2023-10-25 NOTE — TOC Progression Note (Signed)
 Transition of Care (TOC) - Progression Note    Patient Details  Name: Todd [name]. MRN: [id number] Date of Birth: 15-Mar-1956  Transition of Care [facility]) CM/SW Contact  Hye Trawick A [place], LCSW Phone Number: 10/25/2023, 3:32 PM  Clinical Narrative:     CSW was followed up by Marylene Land at [facility], she stated they have made a final decision to decline pt for admission. Stated concerns for pt's place of placement after discharge from short term facility. CSW continue to make attempts to find placement.    TOC will continue to follow.        Expected Discharge Plan and Services                                               Social Determinants of Health (SDOH) Interventions SDOH Screenings   Food Insecurity: Food Insecurity Present (10/11/2023)  Housing: High Risk (10/11/2023)  Transportation Needs: Unmet Transportation Needs (10/11/2023)  Utilities: Not At Risk (10/11/2023)  Alcohol Screen: Low Risk  (04/20/2023)  Financial Resource Strain: Low Risk  (10/27/2022)   Received from [facility]  Physical Activity: Inactive (10/27/2022)   Received from [facility]  Social Connections: Socially Isolated (10/11/2023)  Stress: Stress Concern Present (10/27/2022)   Received from [facility]  Tobacco Use: High Risk (10/10/2023)  Health Literacy: Low Risk  (10/27/2022)   Received from [facility]    Readmission Risk Interventions    10/11/2023    3:09 PM 09/02/2021    1:12 PM  Readmission Risk Prevention Plan  Transportation Screening Complete Complete  PCP or Specialist Appt within 3-5 Days Complete   HRI or Home Care Consult Complete Complete  Social Work Consult for Recovery Care Planning/Counseling Complete Complete  Palliative Care Screening Complete Not Applicable  Medication Review Oceanographer) Complete

## 2023-10-25 NOTE — Progress Notes (Signed)
 Occupational Therapy Treatment Patient Details Name: Todd [name]. MRN: [id number] DOB: Feb 15, 1956 Today's Date: 10/25/2023   History of present illness 68 y.o. male admitted 10/10/2023 with SVT. Pt diagnosed with CAP 10/08/23. PMHx: PAF, HTN, chronic diastolic HF, bipolar disorder, RBBB, BPPV, MDD, substance abuse, DM   OT comments  Patient ready for OT session.  Anxious to improve his mobility, and motivated to discharge from the hospital.  Improved bed mobility, essentially Mod I with HOB elevated.  Min A and extra time for sit to stand, but once up and feeling stable, supervision for in room mobility/toileting at 4WRW level.  Patient wanting to walk in the halls, initial supervision and increasing CGA as knee pain increased.  Patient wanting to sit up in recliner at the conclusion.  OT will continue efforts in the acute setting to address deficits, and Patient will benefit from continued inpatient follow up therapy, <3 hours/day.      If plan is discharge home, recommend the following:  A lot of help with walking and/or transfers;A lot of help with bathing/dressing/bathroom;Assist for transportation;Assistance with cooking/housework;Help with stairs or ramp for entrance   Equipment Recommendations  Tub/shower bench    Recommendations for Other Services      Precautions / Restrictions Precautions Precautions: Fall Recall of Precautions/Restrictions: Intact Precaution/Restrictions Comments: knee pain, prefers bil knee sleeves Restrictions Weight Bearing Restrictions Per Provider Order: No       Mobility Bed Mobility Overal bed mobility: Modified Independent                  Transfers Overall transfer level: Needs assistance Equipment used: Rollator (4 wheels) Transfers: Sit to/from Stand, Bed to chair/wheelchair/BSC Sit to Stand: Contact guard assist, Min assist          Lateral/Scoot Transfers: Contact guard assist, Supervision       Balance Overall balance  assessment: Needs assistance Sitting-balance support: Feet supported Sitting balance-Leahy Scale: Good     Standing balance support: Reliant on assistive device for balance Standing balance-Leahy Scale: Poor                             ADL either performed or assessed with clinical judgement   ADL Overall ADL's : Needs assistance/impaired     Grooming: Wash/dry hands;Standing;Supervision/safety;Sitting   Upper Body Bathing: Set up;Sitting   Lower Body Bathing: Moderate assistance;Sit to/from stand   Upper Body Dressing : Set up;Sitting   Lower Body Dressing: Moderate assistance;Sit to/from stand   Toilet Transfer: Minimal assistance;Rollator (4 wheels);Ambulation   Toileting- Clothing Manipulation and Hygiene: Supervision/safety              Extremity/Trunk Assessment Upper Extremity Assessment Upper Extremity Assessment: Overall WFL for tasks assessed RUE Deficits / Details: decreased end range to shoulder flexion RUE Sensation: WNL RUE Coordination: WNL LUE Deficits / Details: decreased end range to shoulder flexion LUE Sensation: WNL LUE Coordination: WNL   Lower Extremity Assessment Lower Extremity Assessment: Defer to PT evaluation        Vision Patient Visual Report: No change from baseline     Perception Perception Perception: Not tested   Praxis Praxis Praxis: Not tested   Communication Communication Communication: No apparent difficulties   Cognition Arousal: Alert Behavior During Therapy: WFL for tasks assessed/performed Cognition: No apparent impairments  Following commands: Intact Following commands impaired: Only follows one step commands consistently      Cueing   Cueing Techniques: Verbal cues, Tactile cues  Exercises      Shoulder Instructions       General Comments      Pertinent Vitals/ Pain       Pain Assessment Pain Assessment: Faces Faces Pain Scale: Hurts  little more Pain Location: bil knees after mobility Pain Descriptors / Indicators: Aching, Sore, Grimacing Pain Intervention(s): Monitored during session                                                          Frequency  Min 1X/week        Progress Toward Goals  OT Goals(current goals can now be found in the care plan section)  Progress towards OT goals: Progressing toward goals  Acute Rehab OT Goals OT Goal Formulation: With patient Time For Goal Achievement: 11/08/23 Potential to Achieve Goals: Fair ADL Goals Pt Will Perform Upper Body Bathing: with set-up;sitting Pt Will Transfer to Toilet: with modified independence;ambulating;regular height toilet  Plan      Co-evaluation                 AM-PAC OT "6 Clicks" Daily Activity     Outcome Measure   Help from another person eating meals?: None Help from another person taking care of personal grooming?: A Little Help from another person toileting, which includes using toliet, bedpan, or urinal?: A Little Help from another person bathing (including washing, rinsing, drying)?: A Lot Help from another person to put on and taking off regular upper body clothing?: None Help from another person to put on and taking off regular lower body clothing?: A Lot 6 Click Score: 18    End of Session Equipment Utilized During Treatment: Rollator (4 wheels);Gait belt  OT Visit Diagnosis: Unsteadiness on feet (R26.81);Other abnormalities of gait and mobility (R26.89);Muscle weakness (generalized) (M62.81);Pain Pain - part of body: Knee   Activity Tolerance Patient tolerated treatment well   Patient Left in chair;with call bell/phone within reach;with chair alarm set   Nurse Communication Mobility status        Time: 1140-1205 OT Time Calculation (min): 25 min  Charges: OT General Charges $OT Visit: 1 Visit OT Treatments $Self Care/Home Management : 23-37 mins  10/25/2023  RP,  OTR/L  Acute Rehabilitation Services  Office:  [phone]   Suzanna Obey 10/25/2023, 12:45 PM

## 2023-10-26 DIAGNOSIS — I471 Supraventricular tachycardia, unspecified: Secondary | ICD-10-CM | POA: Diagnosis not present

## 2023-10-26 LAB — CBC WITH DIFFERENTIAL/PLATELET
Abs Immature Granulocytes: 0.01 10*3/uL (ref 0.00–0.07)
Basophils Absolute: 0.1 10*3/uL (ref 0.0–0.1)
Basophils Relative: 2 %
Eosinophils Absolute: 0.3 10*3/uL (ref 0.0–0.5)
Eosinophils Relative: 7 %
HCT: 38.7 % — ABNORMAL LOW (ref 39.0–52.0)
Hemoglobin: 12.9 g/dL — ABNORMAL LOW (ref 13.0–17.0)
Immature Granulocytes: 0 %
Lymphocytes Relative: 51 %
Lymphs Abs: 2.1 10*3/uL (ref 0.7–4.0)
MCH: 32.8 pg (ref 26.0–34.0)
MCHC: 33.3 g/dL (ref 30.0–36.0)
MCV: 98.5 fL (ref 80.0–100.0)
Monocytes Absolute: 0.4 10*3/uL (ref 0.1–1.0)
Monocytes Relative: 10 %
Neutro Abs: 1.2 10*3/uL — ABNORMAL LOW (ref 1.7–7.7)
Neutrophils Relative %: 30 %
Platelets: 253 10*3/uL (ref 150–400)
RBC: 3.93 MIL/uL — ABNORMAL LOW (ref 4.22–5.81)
RDW: 15 % (ref 11.5–15.5)
WBC: 4.1 10*3/uL (ref 4.0–10.5)
nRBC: 0 % (ref 0.0–0.2)

## 2023-10-26 LAB — COMPREHENSIVE METABOLIC PANEL
ALT: 12 U/L (ref 0–44)
AST: 19 U/L (ref 15–41)
Albumin: 3 g/dL — ABNORMAL LOW (ref 3.5–5.0)
Alkaline Phosphatase: 40 U/L (ref 38–126)
Anion gap: 5 (ref 5–15)
BUN: 22 mg/dL (ref 8–23)
CO2: 24 mmol/L (ref 22–32)
Calcium: 9.4 mg/dL (ref 8.9–10.3)
Chloride: 106 mmol/L (ref 98–111)
Creatinine, Ser: 1.32 mg/dL — ABNORMAL HIGH (ref 0.61–1.24)
GFR, Estimated: 59 mL/min — ABNORMAL LOW (ref >60)
Glucose, Bld: 103 mg/dL — ABNORMAL HIGH (ref 70–99)
Potassium: 4.1 mmol/L (ref 3.5–5.1)
Sodium: 135 mmol/L (ref 135–145)
Total Bilirubin: 0.5 mg/dL (ref 0.0–1.2)
Total Protein: 6.6 g/dL (ref 6.5–8.1)

## 2023-10-26 LAB — PHOSPHORUS: Phosphorus: 4 mg/dL (ref 2.5–4.6)

## 2023-10-26 LAB — GLUCOSE, CAPILLARY
Glucose-Capillary: 144 mg/dL — ABNORMAL HIGH (ref 70–99)
Glucose-Capillary: 138 mg/dL — ABNORMAL HIGH (ref 70–99)
Glucose-Capillary: 115 mg/dL — ABNORMAL HIGH (ref 70–99)
Glucose-Capillary: 101 mg/dL — ABNORMAL HIGH (ref 70–99)

## 2023-10-26 LAB — MAGNESIUM: Magnesium: 1.7 mg/dL (ref 1.7–2.4)

## 2023-10-26 MED ORDER — METHOCARBAMOL 750 MG PO TABS
750.0000 mg | ORAL_TABLET | Freq: Three times a day (TID) | ORAL | Status: DC
  Administered 2023-10-26 – 2023-10-28 (×8): 750 mg via ORAL
  Filled 2023-10-26 (×8): qty 1

## 2023-10-26 NOTE — Plan of Care (Signed)

## 2023-10-26 NOTE — Plan of Care (Signed)
  Problem: Coping: Goal: Ability to adjust to condition or change in health will improve Outcome: Progressing   Problem: Fluid Volume: Goal: Ability to maintain a balanced intake and output will improve Outcome: Progressing   Problem: Health Behavior/Discharge Planning: Goal: Ability to manage health-related needs will improve Outcome: Progressing   Problem: Metabolic: Goal: Ability to maintain appropriate glucose levels will improve Outcome: Progressing   Problem: Nutritional: Goal: Maintenance of adequate nutrition will improve Outcome: Progressing   Problem: Skin Integrity: Goal: Risk for impaired skin integrity will decrease Outcome: Progressing   Problem: Tissue Perfusion: Goal: Adequacy of tissue perfusion will improve Outcome: Progressing   Problem: Education: Goal: Knowledge of General Education information will improve Description: Including pain rating scale, medication(s)/side effects and non-pharmacologic comfort measures Outcome: Progressing   Problem: Health Behavior/Discharge Planning: Goal: Ability to manage health-related needs will improve Outcome: Progressing   Problem: Clinical Measurements: Goal: Will remain free from infection Outcome: Progressing Goal: Respiratory complications will improve Outcome: Progressing Goal: Cardiovascular complication will be avoided Outcome: Progressing   Problem: Activity: Goal: Risk for activity intolerance will decrease Outcome: Progressing   Problem: Nutrition: Goal: Adequate nutrition will be maintained Outcome: Progressing   Problem: Coping: Goal: Level of anxiety will decrease Outcome: Progressing   Problem: Elimination: Goal: Will not experience complications related to bowel motility Outcome: Progressing Goal: Will not experience complications related to urinary retention Outcome: Progressing

## 2023-10-26 NOTE — Progress Notes (Signed)
 Patient refused flutter valve, spirometry and SCD

## 2023-10-26 NOTE — TOC Progression Note (Signed)
 Transition of Care (TOC) - Progression Note    Patient Details  Name: Todd [name]. MRN: [id number] Date of Birth: 1955/09/14  Transition of Care [facility]) CM/SW Contact  Gurley Climer A [place], LCSW Phone Number: 10/26/2023, 11:02 AM  Clinical Narrative:     Update 1124 CSW received phone call from [facility] regarding possible housing placement for pt. She spoke with pt at bedside via phone and they discussed options for housing/cost of living and care. She informed CSW ALF care is about $2900 and independent housing is less expensive, exact figure not specified. She stated that she would reach back out to CSW regarding availability for possible housing options within pt's income level.    TOC will continue to follow.   CSW met with pt at bedside to provide update on disposition plan. CSW informed pt he was declined to [facility] for SNF due to concerns for long term placement being unstable. CSW has not heard back or gotten updates from any facilities, [facility], [facility] and [facility] regarding referrals that have been sent out.   Pt stated that he cannot go back to the boarding house and that his items have been moved out of his home. Discussed options for possible long term care at hotel temporarily until longer term housing could be solidified. Pt was agreeable due to lack of options.           Expected Discharge Plan and Services                                               Social Determinants of Health (SDOH) Interventions SDOH Screenings   Food Insecurity: Food Insecurity Present (10/11/2023)  Housing: High Risk (10/11/2023)  Transportation Needs: Unmet Transportation Needs (10/11/2023)  Utilities: Not At Risk (10/11/2023)  Alcohol Screen: Low Risk  (04/20/2023)  Financial Resource Strain: Low Risk  (10/27/2022)   Received from [facility]  Physical Activity: Inactive (10/27/2022)   Received from [facility]  Social  Connections: Socially Isolated (10/11/2023)  Stress: Stress Concern Present (10/27/2022)   Received from [facility]  Tobacco Use: High Risk (10/10/2023)  Health Literacy: Low Risk  (10/27/2022)   Received from [facility]    Readmission Risk Interventions    10/11/2023    3:09 PM 09/02/2021    1:12 PM  Readmission Risk Prevention Plan  Transportation Screening Complete Complete  PCP or Specialist Appt within 3-5 Days Complete   HRI or Home Care Consult Complete Complete  Social Work Consult for Recovery Care Planning/Counseling Complete Complete  Palliative Care Screening Complete Not Applicable  Medication Review Oceanographer) Complete

## 2023-10-26 NOTE — Progress Notes (Signed)
 TRH ROUNDING NOTE Todd [name]. ZOX:[id number]  DOB: January 24, 1956  DOA: 10/10/2023  PCP: Blima Singer, PA  10/26/2023,7:16 AM  LOS: 0 days    Code Status: Full code   from: Home current Dispo: Skilled   68 year old black male Known underlying depression/bipolar 1 Prior PE and DVT DM TY 2 Polysubstance abuse + cocaine + alcohol back in 2023 A-fib RVR noted back in January 2023 Chronic back pain chronic left knee pain and likely gout in the past  2/22 present [facility] ED cough-Rx doxycycline prednisone Followed by orthopedics several days later heart rates 100s-found to be in rapid A-fib Rx adenosine and despite this repeated attempted cardioversion was performed Placed on Cardizem gtt. 2/27 coronary CTA showed minimal nonobstructive CAD coronary calcification 32 2/25 echocardiogram EF 55-60% normal RV function normal mitral valve   Plan  SVT in setting of atrial flutter-nonresponsive to adenosine and cardioverted in ED Patient will go home on Zio patch as an outpatient and EP will follow-up Transitioned from Cardizem gtt. 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 this hospital stay Amlodipine discontinued this hospital stay in favor of other meds  Community-acquired pneumonia with bronchitis Initial treatment as outpatient with doxycycline While hospitalized Rx 2/22 through 2/27 with Doxy and Rocephin and discontinued Last CXR 2/24 improved infiltrates  AKI on admission with underlying CKD 2 Improved-no further workup-avoid nephrotoxins  NIDDM A1c 5.8 CBGs ranging 100s to 150-A1c less than 6 Stopped insulin sliding scale for now Needs outpatient reeval and discussion  Chronic lower extremity osteoarthritis knee At home takes Xtampza 1 capsule twice daily-currently on oxycodone 1 tab every 6 as needed pain  add back Robaxin 750 3 times daily  Tobacco abuse EtOH prior cocaine  cessation counseled    anxiety Continue Cymbalta DR 40 hydroxyzine 25  as needed anxiety 3 times daily, trazodone 300 at bedtime At home is on BuSpar 10 twice daily  Dysphagia Stable  Continues to await skilled bed   Dvt prophylaxis: Lovenox  Status is: Observation The patient will require care spanning > 2 midnights and should be moved to inpatient because: Await skilled bed placement      Subjective: Awake coherent complains of 8/10 pain with ambulation No chest pain Some short of breath Not on oxygen Looks comfortable  Objective + exam Vitals:   10/25/23 1727 10/25/23 2007 10/26/23 0457 10/26/23 0502  BP: 130/78 124/78  121/77  Pulse: 62 67  60  Resp: 18 17  18   Temp: 98 F (36.7 C) (!) 97.5 F (36.4 C)  97.9 F (36.6 C)  TempSrc: Oral Oral    SpO2: 99% 97%  95%  Weight:   96.2 kg   Height:       Filed Weights   10/24/23 0500 10/25/23 0500 10/26/23 0457  Weight: 96.3 kg 95.1 kg 96.2 kg    Examination: EOMI NCAT no focal deficit no icterus no pallor no wheeze no rales no rhonchi ROM intact moving 4 limbs equally S1-S2 seems to be in sinus Abdomen soft no rebound no guarding No lower extremity edema Neuro grossly intact moving limbs without deficit  Data Reviewed: reviewed   CBC    Component Value Date/Time   WBC 4.1 10/26/2023 0612   RBC 3.93 (L) 10/26/2023 0612   HGB 12.9 (L) 10/26/2023 0612   HCT 38.7 (L) 10/26/2023 0612   HCT 34.0 (L) 09/05/2020 0815   PLT 253 10/26/2023 0612   MCV 98.5  10/26/2023 0612   MCH 32.8 10/26/2023 0612   MCHC 33.3 10/26/2023 0612   RDW 15.0 10/26/2023 0612   LYMPHSABS 2.1 10/26/2023 0612   MONOABS 0.4 10/26/2023 0612   EOSABS 0.3 10/26/2023 0612   BASOSABS 0.1 10/26/2023 0612      Latest Ref Rng & Units 10/26/2023    6:12 AM 10/25/2023    5:55 AM 10/24/2023    7:41 AM  CMP  Glucose 70 - 99 mg/dL 161  096  045   BUN 8 - 23 mg/dL 22  19  22    Creatinine 0.61 - 1.24 mg/dL 4.09  8.11  9.14   Sodium 135 - 145 mmol/L 135  137  138    Potassium 3.5 - 5.1 mmol/L 4.1  4.4  4.7   Chloride 98 - 111 mmol/L 106  105  108   CO2 22 - 32 mmol/L 24  26  24    Calcium 8.9 - 10.3 mg/dL 9.4  9.8  9.6   Total Protein 6.5 - 8.1 g/dL 6.6  6.8  6.9   Total Bilirubin 0.0 - 1.2 mg/dL 0.5  0.5  0.5   Alkaline Phos 38 - 126 U/L 40  41  43   AST 15 - 41 U/L 19  21  19    ALT 0 - 44 U/L 12  11  13      Scheduled Meds:  acetaminophen  650 mg Oral Q6H   vitamin B-12  1,000 mcg Oral Daily   diltiazem  180 mg Oral Daily   DULoxetine  40 mg Oral Daily   enoxaparin (LOVENOX) injection  40 mg Subcutaneous Q24H   folic acid  1 mg Oral Daily   insulin aspart  0-6 Units Subcutaneous TID WC   lidocaine  2 patch Transdermal Q24H   multivitamin with minerals  1 tablet Oral Daily   pantoprazole  40 mg Oral Daily   thiamine  100 mg Oral Daily   traZODone  300 mg Oral QHS   Continuous Infusions:  Time 33  Rhetta Mura, MD  [facility]

## 2023-10-26 NOTE — Progress Notes (Signed)
 Mobility Specialist: Progress Note   10/26/23 1233  Mobility  Activity Ambulated with assistance in hallway  Level of Assistance Contact guard assist, steadying assist  Assistive Device Four wheel walker  Distance Ambulated (ft) 100 ft  Activity Response Tolerated well  Mobility Referral Yes  Mobility visit 1 Mobility  Mobility Specialist Start Time (ACUTE ONLY) 1149  Mobility Specialist Stop Time (ACUTE ONLY) 1211  Mobility Specialist Time Calculation (min) (ACUTE ONLY) 22 min    Pt was agreeable to mobility session - received in bed. ModI for bed mobility, MinG for STS and ambulation. C/o bil knee pain (L>R) that worsened with further ambulation, reached to 10/10 pain by the EOS. Returned to room without fault. CG to return to supine, pt needed assist with swinging LLE onto bed. Left in bed with all needs met, call bell in reach.   Maurene Capes Mobility Specialist Please contact via SecureChat or Rehab office at [phone]

## 2023-10-27 DIAGNOSIS — I471 Supraventricular tachycardia, unspecified: Secondary | ICD-10-CM | POA: Diagnosis not present

## 2023-10-27 LAB — COMPREHENSIVE METABOLIC PANEL
ALT: 13 U/L (ref 0–44)
AST: 21 U/L (ref 15–41)
Albumin: 3.4 g/dL — ABNORMAL LOW (ref 3.5–5.0)
Alkaline Phosphatase: 40 U/L (ref 38–126)
Anion gap: 11 (ref 5–15)
BUN: 22 mg/dL (ref 8–23)
CO2: 21 mmol/L — ABNORMAL LOW (ref 22–32)
Calcium: 9.4 mg/dL (ref 8.9–10.3)
Chloride: 104 mmol/L (ref 98–111)
Creatinine, Ser: 1.15 mg/dL (ref 0.61–1.24)
GFR, Estimated: >60 mL/min (ref >60)
Glucose, Bld: 156 mg/dL — ABNORMAL HIGH (ref 70–99)
Potassium: 4.1 mmol/L (ref 3.5–5.1)
Sodium: 136 mmol/L (ref 135–145)
Total Bilirubin: 0.5 mg/dL (ref 0.0–1.2)
Total Protein: 7.2 g/dL (ref 6.5–8.1)

## 2023-10-27 LAB — CBC WITH DIFFERENTIAL/PLATELET
Abs Immature Granulocytes: 0.01 10*3/uL (ref 0.00–0.07)
Basophils Absolute: 0.1 10*3/uL (ref 0.0–0.1)
Basophils Relative: 1 %
Eosinophils Absolute: 0.3 10*3/uL (ref 0.0–0.5)
Eosinophils Relative: 6 %
HCT: 42.1 % (ref 39.0–52.0)
Hemoglobin: 13.9 g/dL (ref 13.0–17.0)
Immature Granulocytes: 0 %
Lymphocytes Relative: 43 %
Lymphs Abs: 1.9 10*3/uL (ref 0.7–4.0)
MCH: 32.5 pg (ref 26.0–34.0)
MCHC: 33 g/dL (ref 30.0–36.0)
MCV: 98.4 fL (ref 80.0–100.0)
Monocytes Absolute: 0.4 10*3/uL (ref 0.1–1.0)
Monocytes Relative: 8 %
Neutro Abs: 2 10*3/uL (ref 1.7–7.7)
Neutrophils Relative %: 42 %
Platelets: 259 10*3/uL (ref 150–400)
RBC: 4.28 MIL/uL (ref 4.22–5.81)
RDW: 15 % (ref 11.5–15.5)
WBC: 4.6 10*3/uL (ref 4.0–10.5)
nRBC: 0 % (ref 0.0–0.2)

## 2023-10-27 LAB — MAGNESIUM: Magnesium: 1.7 mg/dL (ref 1.7–2.4)

## 2023-10-27 LAB — GLUCOSE, CAPILLARY: Glucose-Capillary: 121 mg/dL — ABNORMAL HIGH (ref 70–99)

## 2023-10-27 NOTE — Plan of Care (Signed)
  Problem: Coping: Goal: Ability to adjust to condition or change in health will improve Outcome: Progressing   Problem: Fluid Volume: Goal: Ability to maintain a balanced intake and output will improve Outcome: Progressing   Problem: Health Behavior/Discharge Planning: Goal: Ability to manage health-related needs will improve Outcome: Progressing   Problem: Metabolic: Goal: Ability to maintain appropriate glucose levels will improve Outcome: Progressing   Problem: Nutritional: Goal: Maintenance of adequate nutrition will improve Outcome: Progressing   Problem: Skin Integrity: Goal: Risk for impaired skin integrity will decrease Outcome: Progressing   Problem: Tissue Perfusion: Goal: Adequacy of tissue perfusion will improve Outcome: Progressing   Problem: Education: Goal: Knowledge of General Education information will improve Description: Including pain rating scale, medication(s)/side effects and non-pharmacologic comfort measures Outcome: Progressing   Problem: Health Behavior/Discharge Planning: Goal: Ability to manage health-related needs will improve Outcome: Progressing   Problem: Clinical Measurements: Goal: Will remain free from infection Outcome: Progressing   Problem: Activity: Goal: Risk for activity intolerance will decrease Outcome: Progressing   Problem: Nutrition: Goal: Adequate nutrition will be maintained Outcome: Progressing   Problem: Coping: Goal: Level of anxiety will decrease Outcome: Progressing   Problem: Safety: Goal: Ability to remain free from injury will improve Outcome: Progressing   Problem: Skin Integrity: Goal: Risk for impaired skin integrity will decrease Outcome: Progressing

## 2023-10-27 NOTE — TOC Progression Note (Signed)
 Transition of Care (TOC) - Progression Note    Patient Details  Name: Todd [name]. MRN: [id number] Date of Birth: 20-Aug-1955  Transition of Care [facility]) CM/SW Contact  Janae Bridgeman, RN Phone Number: 10/27/2023, 4:38 PM  Clinical Narrative:    CM met with the patient at the bedside to offered Medicare choice regarding home health services and DME and the patient did not have a preference for company.    Patient states that he bought his own Rolator and needs a new one.  Patient declined WC and preferred a Rolator.  [employer] was called to deliver a new Rolator to the patient's hospital room today.  Patient was offered choice regarding home health and he did not have a preference.  I called Zollie Scale and Kandee Keen, RNCM accepted for [facility] PT, OT and MSW.  Patient plans to discharge to ALF/Hotel tomorrow.        Expected Discharge Plan and Services                                               Social Determinants of Health (SDOH) Interventions SDOH Screenings   Food Insecurity: Food Insecurity Present (10/11/2023)  Housing: High Risk (10/11/2023)  Transportation Needs: Unmet Transportation Needs (10/11/2023)  Utilities: Not At Risk (10/11/2023)  Alcohol Screen: Low Risk  (04/20/2023)  Financial Resource Strain: Low Risk  (10/27/2022)   Received from [facility]  Physical Activity: Inactive (10/27/2022)   Received from [facility]  Social Connections: Socially Isolated (10/11/2023)  Stress: Stress Concern Present (10/27/2022)   [facility] from [facility]  Tobacco Use: High Risk (10/10/2023)  Health Literacy: Low Risk  (10/27/2022)   Received from [facility]    Readmission Risk Interventions    10/11/2023    3:09 PM 09/02/2021    1:12 PM  Readmission Risk Prevention Plan  Transportation Screening Complete Complete  PCP or Specialist Appt within 3-5 Days Complete   HRI or Home Care Consult Complete Complete  Social Work Consult for Recovery Care  Planning/Counseling Complete Complete  Palliative Care Screening Complete Not Applicable  Medication Review Oceanographer) Complete

## 2023-10-27 NOTE — Progress Notes (Signed)
    Durable Medical Equipment  (From admission, onward)           Start     Ordered   10/27/23 1632  For home use only DME 4 wheeled rolling walker with seat  Once       Question:  Patient needs a walker to treat with the following condition  Answer:  Generalized weakness   10/27/23 1631

## 2023-10-27 NOTE — Progress Notes (Signed)
 Physical Therapy Treatment Patient Details Name: Todd [name]. MRN: [id number] DOB: November 30, 1955 Today's Date: 10/27/2023   History of Present Illness 68 y.o. male admitted 10/10/2023 with SVT. Pt diagnosed with CAP 10/08/23. PMHx: PAF, HTN, chronic diastolic HF, bipolar disorder, RBBB, BPPV, MDD, substance abuse, DM    PT Comments  Patient agreeable to PT interventions. Eager to walk (and even asking at end of session for PT to come again later--notified Mobility Team in hopes they can see him later). Due to bil knee pain, insists on elevated bed for sit to stand (although reports he just got up from recliner with nursing). Educated in basic AROM exercises prior to ambulation with potential to decrease arthritis pain in knees. Pt ambulated 90 ft with one instance of L knee partially buckling and min assist to recover balance. Noted discharge plan may now be to long-term hotel. If does not qualify for inpatient rehab <3 hours of therapy/day, then would recommend HHPT.     If plan is discharge home, recommend the following: Assistance with cooking/housework;Assist for transportation;Help with stairs or ramp for entrance;Direct supervision/assist for medications management;A little help with walking and/or transfers;A little help with bathing/dressing/bathroom   Can travel by private vehicle     Yes  Equipment Recommendations  Wheelchair (measurements PT);Wheelchair cushion (measurements PT);Rollator (4 wheels)    Recommendations for Other Services       Precautions / Restrictions Precautions Precautions: Fall Recall of Precautions/Restrictions: Intact Precaution/Restrictions Comments: knee pain, prefers bil knee sleeves Restrictions Weight Bearing Restrictions Per Provider Order: No     Mobility  Bed Mobility Overal bed mobility: Modified Independent Bed Mobility: Supine to Sit, Sit to Supine     Supine to sit: HOB elevated, Modified independent (Device/Increase time) Sit to  supine: Min assist   General bed mobility comments: inc time/effort; assist to raise LLE back onto bed    Transfers Overall transfer level: Needs assistance Equipment used: Rollator (4 wheels) Transfers: Sit to/from Stand Sit to Stand: Contact guard assist, From elevated surface           General transfer comment: pt insists on elevated bed for sit to stand; from this height, pt uses hands on rollator as coming to/from stand    Ambulation/Gait Ambulation/Gait assistance: Min assist Gait Distance (Feet): 90 Feet Assistive device: Rollator (4 wheels) Gait Pattern/deviations: Step-through pattern, Decreased stride length, Trunk flexed, Antalgic Gait velocity: reduced     General Gait Details: pt CGA except for one instance of partial buckle Lt knee with min assist for balance/recovery   Stairs             Wheelchair Mobility     Tilt Bed    Modified Rankin (Stroke Patients Only)       Balance Overall balance assessment: Needs assistance Sitting-balance support: Feet supported Sitting balance-Leahy Scale: Good     Standing balance support: Reliant on assistive device for balance Standing balance-Leahy Scale: Poor Standing balance comment: Rollator reliant                            Communication Communication Communication: No apparent difficulties  Cognition Arousal: Alert Behavior During Therapy: WFL for tasks assessed/performed   PT - Cognitive impairments: Safety/Judgement, Awareness, Attention, Problem solving, No family/caregiver present to determine baseline                       PT - Cognition Comments: better awareness of safety as  instructing PT to use his gait belt and "don't let go of it--my knees can give out" Following commands: Intact Following commands impaired: Only follows one step commands consistently    Cueing Cueing Techniques: Verbal cues, Tactile cues  Exercises General Exercises - Lower Extremity Ankle  Circles/Pumps: AROM, Both, 10 reps Quad Sets: AROM, Both, 10 reps Heel Slides: AROM, Both, 10 reps Straight Leg Raises: AROM, 5 reps, Both    General Comments        Pertinent Vitals/Pain Pain Assessment Pain Assessment: Faces Faces Pain Scale: Hurts little more Pain Location: bil knees with mobility Pain Descriptors / Indicators: Aching, Sore, Grimacing Pain Intervention(s): Limited activity within patient's tolerance, Monitored during session, Repositioned, Other (comment) (AROM prior to ambulation; bil knee sleeves)    Home Living                          Prior Function            PT Goals (current goals can now be found in the care plan section) Acute Rehab PT Goals Patient Stated Goal: to reduce L knee pain, improve strength and stop falling Time For Goal Achievement: 10/25/23 Potential to Achieve Goals: Fair Progress towards PT goals: Progressing toward goals    Frequency    Min 2X/week      PT Plan      Co-evaluation              AM-PAC PT "6 Clicks" Mobility   Outcome Measure  Help needed turning from your back to your side while in a flat bed without using bedrails?: None Help needed moving from lying on your back to sitting on the side of a flat bed without using bedrails?: None Help needed moving to and from a bed to a chair (including a wheelchair)?: A Little Help needed standing up from a chair using your arms (e.g., wheelchair or bedside chair)?: A Little Help needed to walk in hospital room?: A Little Help needed climbing 3-5 steps with a railing? : A Lot 6 Click Score: 19    End of Session Equipment Utilized During Treatment: Gait belt Activity Tolerance: Patient tolerated treatment well Patient left: in chair;with call bell/phone within reach;with chair alarm set Nurse Communication: Mobility status PT Visit Diagnosis: Other abnormalities of gait and mobility (R26.89);Muscle weakness (generalized) (M62.81);History of falling  (Z91.81)     Time: 1610-9604 PT Time Calculation (min) (ACUTE ONLY): 19 min  Charges:    $Gait Training: 8-22 mins PT General Charges $$ ACUTE PT VISIT: 1 Visit                      Jerolyn Center, PT Acute Rehabilitation Services  Office [phone]    Zena Amos 10/27/2023, 11:55 AM

## 2023-10-27 NOTE — Progress Notes (Signed)
 Mobility Specialist: Progress Note   10/27/23 1604  Mobility  Activity Ambulated with assistance in hallway  Level of Assistance Contact guard assist, steadying assist  Assistive Device Four wheel walker  Distance Ambulated (ft) 80 ft  Activity Response Tolerated fair  Mobility Referral Yes  Mobility visit 1 Mobility  Mobility Specialist Start Time (ACUTE ONLY) 1540  Mobility Specialist Stop Time (ACUTE ONLY) 1555  Mobility Specialist Time Calculation (min) (ACUTE ONLY) 15 min    Pt was agreeable to mobility session - received in bed. C/o bil knee pain. MinA for STS, minG for ambulation. Distance limited d/t knee pain and dizziness. Returned to room without fault. Left in bed with all needs met, call bell in reach. Bed alarm on.   Maurene Capes Mobility Specialist Please contact via SecureChat or Rehab office at [phone]

## 2023-10-27 NOTE — Progress Notes (Signed)
 TRH ROUNDING NOTE Todd [name]. UJW:[id number]  DOB: 06-02-1956  DOA: 10/10/2023  PCP: Blima Singer, PA  10/27/2023,8:16 AM  LOS: 0 days    Code Status: Full code   from: Home current Dispo: Skilled   68 year old black male Known underlying depression/bipolar 1 Prior PE and DVT DM TY 2 Polysubstance abuse + cocaine + alcohol back in 2023 A-fib RVR noted back in January 2023 Chronic back pain chronic left knee pain and likely gout in the past  2/22 present [facility] ED cough-Rx doxycycline prednisone Followed by orthopedics several days later heart rates 100s-found to be in rapid A-fib Rx adenosine and despite this repeated attempted cardioversion was performed Placed on Cardizem gtt. 2/27 coronary CTA showed minimal nonobstructive CAD coronary calcification 32 2/25 echocardiogram EF 55-60% normal RV function normal mitral valve   Plan  SVT in setting of atrial flutter-nonresponsive to adenosine and DCCV cardioverted in ED Patient will go home on Zio patch as an outpatient and EP will follow-up-will contact EP today to set up the same Transitioned from Cardizem gtt. 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 this hospital stay Amlodipine discontinued this hospital stay in favor of other meds  Community-acquired pneumonia with bronchitis Initial treatment as outpatient with doxycycline While hospitalized Rx 2/22 through 2/27 with Doxy and Rocephin and discontinued and patient is stable Last CXR 2/24 improved infiltrates  AKI on admission with underlying CKD 2 Improved-no further workup-avoid nephrotoxins  NIDDM A1c 5.8 CBGs low 100 range A1c less than 6 Stop CBG checks stop insulin checks Needs outpatient reeval and discussion  Chronic lower extremity osteoarthritis knee At home takes Xtampza 1 capsule twice daily-currently on oxycodone 1 tab every 6 as needed pain  add back  Robaxin 750 3 times daily  Tobacco abuse EtOH prior cocaine  cessation counseled   anxiety Continue Cymbalta DR 40 hydroxyzine 25  as needed anxiety 3 times daily, trazodone 300 at bedtime At home is on BuSpar 10 twice daily  Dysphagia Stable  Continues to await skilled bed   Dvt prophylaxis: Lovenox  Status is: Observation The patient will require care spanning > 2 midnights and should be moved to inpatient because: Await skilled bed placement      Subjective:  Well in no distress does have some sputum and is coughing a little bit but no chest pain no fever no chills Overall no changes  Objective + exam Vitals:   10/26/23 1957 10/27/23 0429 10/27/23 0500 10/27/23 0737  BP: 125/76 127/88  134/81  Pulse: 64 60  75  Resp: 20 18    Temp: 97.8 F (36.6 C) 98.4 F (36.9 C)  98.1 F (36.7 C)  TempSrc: Oral     SpO2: 95% 100%  95%  Weight:   95 kg   Height:       Filed Weights   10/25/23 0500 10/26/23 0457 10/27/23 0500  Weight: 95.1 kg 96.2 kg 95 kg    Examination:  EOMI NCAT no focal deficit S1-S2 no murmur seems regular sinus Chest is clear anteriorly and lateral lung fields Abdomen soft no rebound no guarding ROM intact Power 5/5   Data Reviewed: reviewed   CBC    Component Value Date/Time   WBC 4.6 10/27/2023 0741   RBC 4.28 10/27/2023 0741   HGB 13.9 10/27/2023 0741   HCT 42.1 10/27/2023 0741   HCT 34.0 (L) 09/05/2020 0815   PLT 259 10/27/2023  0741   MCV 98.4 10/27/2023 0741   MCH 32.5 10/27/2023 0741   MCHC 33.0 10/27/2023 0741   RDW 15.0 10/27/2023 0741   LYMPHSABS 1.9 10/27/2023 0741   MONOABS 0.4 10/27/2023 0741   EOSABS 0.3 10/27/2023 0741   BASOSABS 0.1 10/27/2023 0741      Latest Ref Rng & Units 10/26/2023    6:12 AM 10/25/2023    5:55 AM 10/24/2023    7:41 AM  CMP  Glucose 70 - 99 mg/dL 096  045  409   BUN 8 - 23 mg/dL 22  19  22    Creatinine 0.61 - 1.24 mg/dL 8.11  9.14  7.82   Sodium 135 - 145 mmol/L 135  137  138    Potassium 3.5 - 5.1 mmol/L 4.1  4.4  4.7   Chloride 98 - 111 mmol/L 106  105  108   CO2 22 - 32 mmol/L 24  26  24    Calcium 8.9 - 10.3 mg/dL 9.4  9.8  9.6   Total Protein 6.5 - 8.1 g/dL 6.6  6.8  6.9   Total Bilirubin 0.0 - 1.2 mg/dL 0.5  0.5  0.5   Alkaline Phos 38 - 126 U/L 40  41  43   AST 15 - 41 U/L 19  21  19    ALT 0 - 44 U/L 12  11  13      Scheduled Meds:  acetaminophen  650 mg Oral Q6H   vitamin B-12  1,000 mcg Oral Daily   diltiazem  180 mg Oral Daily   DULoxetine  40 mg Oral Daily   enoxaparin (LOVENOX) injection  40 mg Subcutaneous Q24H   folic acid  1 mg Oral Daily   lidocaine  2 patch Transdermal Q24H   methocarbamol  750 mg Oral TID   multivitamin with minerals  1 tablet Oral Daily   pantoprazole  40 mg Oral Daily   thiamine  100 mg Oral Daily   traZODone  300 mg Oral QHS   Continuous Infusions:  Time 33  Rhetta Mura, MD  [facility]

## 2023-10-28 ENCOUNTER — Other Ambulatory Visit (HOSPITAL_COMMUNITY): Payer: Self-pay

## 2023-10-28 DIAGNOSIS — I471 Supraventricular tachycardia, unspecified: Secondary | ICD-10-CM | POA: Diagnosis not present

## 2023-10-28 MED ORDER — DULOXETINE HCL 20 MG PO CPEP
40.0000 mg | ORAL_CAPSULE | Freq: Every day | ORAL | 3 refills | Status: DC
  Filled 2023-10-28: qty 60, 30d supply, fill #0

## 2023-10-28 MED ORDER — HYDROXYZINE HCL 25 MG PO TABS
25.0000 mg | ORAL_TABLET | Freq: Three times a day (TID) | ORAL | 1 refills | Status: AC | PRN
  Filled 2023-10-28: qty 90, 30d supply, fill #0

## 2023-10-28 MED ORDER — DILTIAZEM HCL ER COATED BEADS 180 MG PO CP24
180.0000 mg | ORAL_CAPSULE | Freq: Every day | ORAL | 11 refills | Status: DC
  Filled 2023-10-28: qty 30, 30d supply, fill #0

## 2023-10-28 MED ORDER — OXYCODONE-ACETAMINOPHEN 5-325 MG PO TABS
1.0000 | ORAL_TABLET | Freq: Four times a day (QID) | ORAL | 0 refills | Status: DC | PRN
  Filled 2023-10-28: qty 30, 8d supply, fill #0

## 2023-10-28 NOTE — Progress Notes (Signed)
 10/28/2023  [name]. DOB: [birth year] MRN: [id number]   RIDER WAIVER AND RELEASE OF LIABILITY  For the purposes of helping with transportation needs, [facility] partners with outside transportation providers (taxi companies, [place], Catering manager.) to give [employer] patients or other approved people the choice of on-demand rides [employer]") to our buildings for non-emergency visits.  By using [employer], I, the person signing this document, on behalf of myself and/or any legal minors (in my care using the [employer]), agree:  Science writer given to me are supplied by independent, outside transportation providers who do not work for, or have any affiliation with, [employer]. [place] is not a transportation company. [facility] has no control over the quality or safety of the rides I get using [employer]. [place] has no control over whether any outside ride will happen on time or not. [facility] gives no guarantee on the reliability, quality, safety, or availability on any rides, or that no mistakes will happen. I know and accept that traveling by vehicle (car, truck, SVU, Zenaida Niece, bus, taxi, etc.) has risks of serious injuries such as disability, being paralyzed, and death. I know and agree the risk of using [employer] is mine alone, and not [employer]. Transport Services are provided "as is" and as are available. The transportation providers are in charge for all inspections and care of the vehicles used to provide these rides. I agree not to take legal action against [place], its agents, employees, officers, directors, representatives, insurers, attorneys, assigns, successors, subsidiaries, and affiliates at any time for any reasons related directly or indirectly to using [employer]. I also agree not to take legal action against [place] or its affiliates for any injury, death, or damage to property caused by or related to using  [employer]. I have read this Waiver and Release of Liability, and I understand the terms used in it and their legal meaning. This Waiver is freely and voluntarily given with the understanding that my right (or any legal minors) to legal action against [place] relating to [employer] is knowingly given up to use these services.   I attest that I read the Ride Waiver and Release of Liability to [name]., gave Mr. Bilyeu the opportunity to ask questions and answered the questions asked (if any). I affirm that [name]. then provided consent for assistance with transportation.

## 2023-10-28 NOTE — TOC Transition Note (Signed)
 Transition of Care [facility]) - Discharge Note   Patient Details  Name: Todd [name]. MRN: [id number] Date of Birth: [birth year]  [facility] of [facility]) CM/SW Contact:  Kenna Kirn A [place], LCSW Phone Number: 10/28/2023, 4:45 PM   Clinical Narrative:     Patient will DC to: [facility], extended stay  Anticipated DC date: 10/28/23  Family notified: Tica, pt's niece  Transport by: [employer] transportation.       Per MD patient ready for DC to [facility], extended stay. RN, patient, patient's family, and facility notified of DC. RNCM set up home health services for pt. Safe transportation for patient. CSW provided contacts for Trenton Gammon and Hydia at [employer] [facility] to follow up on permanent placement in the community.     CSW will sign off for now as social work intervention is no longer needed. Please consult [place] again if new needs arise.   Final next level of care: Skilled Nursing Facility Barriers to Discharge: Barriers Resolved   Patient Goals and CMS Choice            Discharge Placement                Patient to be transferred to facility by: [facility]por[facility] Name of family member notified: Tica, pt's niece Patient and family notified of of transfer: 10/28/23  Discharge Plan and Services Additional resources added to the After Visit Summary for                                       Social Drivers of Health (SDOH) Interventions SDOH Screenings   Food Insecurity: Food Insecurity Present (10/11/2023)  Housing: High Risk (10/11/2023)  Transportation Needs: Unmet Transportation Needs (10/11/2023)  Utilities: Not At Risk (10/11/2023)  Alcohol Screen: Low Risk  (04/20/2023)  Financial Resource Strain: Low Risk  (10/27/2022)   Received from [facility]  Physical Activity: Inactive (10/27/2022)   [facility] from [facility]  Social Connections: Socially Isolated (10/11/2023)  Stress: Stress Concern Present (10/27/2022)   [facility] from [facility]   Tobacco Use: High Risk (10/10/2023)  Health Literacy: Low Risk  (10/27/2022)   [facility] from [facility]     Readmission Risk Interventions    10/11/2023    3:09 PM 09/02/2021    1:12 PM  Readmission Risk Prevention Plan  Transportation Screening Complete Complete  PCP or Specialist Appt within 3-5 Days Complete   HRI or Home Care Consult Complete Complete  Social Work Consult for Recovery Care Planning/Counseling Complete Complete  Palliative Care Screening Complete Not Applicable  Medication Review Oceanographer) Complete

## 2023-10-28 NOTE — Progress Notes (Signed)
 Occupational Therapy Treatment Patient Details Name: Todd [name]. MRN: [id number] DOB: 03/15/1956 Today's Date: 10/28/2023   History of present illness 68 y.o. male admitted 10/10/2023 with SVT. Pt diagnosed with CAP 10/08/23. PMHx: PAF, HTN, chronic diastolic HF, bipolar disorder, RBBB, BPPV, MDD, substance abuse, DM   OT comments  Pt making progress with functional goals. Requires increased time and effort to sit EOB, mod A A to power up for sit - stand to 4WRW, min A/CGA SPTs, set up with UB ADLs and grooming seated and mod A for LB ADL tasks. Pt eager to d/c this afternoon      If plan is discharge home, recommend the following:  A lot of help with bathing/dressing/bathroom;Assist for transportation;Assistance with cooking/housework;Help with stairs or ramp for entrance;A little help with walking and/or transfers   Equipment Recommendations  Tub/shower bench    Recommendations for Other Services      Precautions / Restrictions Precautions Precautions: Fall Recall of Precautions/Restrictions: Intact Precaution/Restrictions Comments: B knees have pain, has B knee braces/sleeves Restrictions Weight Bearing Restrictions Per Provider Order: No       Mobility Bed Mobility Overal bed mobility: Needs Assistance Bed Mobility: Supine to Sit, Sit to Supine     Supine to sit: HOB elevated, Modified independent (Device/Increase time) Sit to supine: Min assist   General bed mobility comments: inc time/effort; used rails, assist with L LE back onto bed    Transfers Overall transfer level: Needs assistance Equipment used: Rollator (4 wheels) Transfers: Sit to/from Stand Sit to Stand: Mod assist Stand pivot transfers: Contact guard assist         General transfer comment: pt insists on elevated bed for sit to stand; from this height, pt uses hands on rollator as coming to/from stand     Balance Overall balance assessment: Needs assistance Sitting-balance support: Feet  supported Sitting balance-Leahy Scale: Good     Standing balance support: Reliant on assistive device for balance, During functional activity Standing balance-Leahy Scale: Poor                             ADL either performed or assessed with clinical judgement   ADL Overall ADL's : Needs assistance/impaired         Upper Body Bathing: Set up;Sitting Upper Body Bathing Details (indicate cue type and reason): simulated Lower Body Bathing: Minimal assistance;Sitting/lateral leans Lower Body Bathing Details (indicate cue type and reason): simulated Upper Body Dressing : Set up;Sitting   Lower Body Dressing: Moderate assistance;Sit to/from stand Lower Body Dressing Details (indicate cue type and reason): B  knee sleeves, pants, and socks Toilet Transfer: Minimal assistance;Contact guard assist;Stand-pivot Toilet Transfer Details (indicate cue type and reason): simulated to chair Toileting- Clothing Manipulation and Hygiene: Supervision/safety;Sitting/lateral lean       Functional mobility during ADLs: Minimal assistance;Contact guard assist      Extremity/Trunk Assessment Upper Extremity Assessment Upper Extremity Assessment: Overall WFL for tasks assessed RUE: Unable to fully assess due to pain       Cervical / Trunk Assessment Cervical / Trunk Assessment: Kyphotic    Vision Baseline Vision/History: 1 Wears glasses Ability to See in Adequate Light: 0 Adequate Patient Visual Report: No change from baseline     Perception     Praxis     Communication Communication Communication: No apparent difficulties   Cognition Arousal: Alert Behavior During Therapy: WFL for tasks assessed/performed Cognition: No apparent impairments  OT - Cognition Comments: talkative                 Following commands: Intact Following commands impaired: Only follows one step commands consistently      Cueing   Cueing Techniques: Verbal cues,  Tactile cues  Exercises      Shoulder Instructions       General Comments      Pertinent Vitals/ Pain       Pain Assessment Pain Assessment: Faces Faces Pain Scale: Hurts little more Pain Location: B knees with mobility Pain Descriptors / Indicators: Aching Pain Intervention(s): Limited activity within patient's tolerance, Monitored during session, Repositioned  Home Living                                          Prior Functioning/Environment              Frequency  Min 1X/week        Progress Toward Goals  OT Goals(current goals can now be found in the care plan section)  Progress towards OT goals: Progressing toward goals     Plan      Co-evaluation                 AM-PAC OT "6 Clicks" Daily Activity     Outcome Measure   Help from another person eating meals?: None Help from another person taking care of personal grooming?: A Little Help from another person toileting, which includes using toliet, bedpan, or urinal?: A Little Help from another person bathing (including washing, rinsing, drying)?: A Lot Help from another person to put on and taking off regular upper body clothing?: None Help from another person to put on and taking off regular lower body clothing?: A Lot 6 Click Score: 18    End of Session Equipment Utilized During Treatment: Rollator (4 wheels);Gait belt  OT Visit Diagnosis: Unsteadiness on feet (R26.81);Other abnormalities of gait and mobility (R26.89);Muscle weakness (generalized) (M62.81);Pain Pain - part of body: Knee (B knees)   Activity Tolerance Patient tolerated treatment well   Patient Left with call bell/phone within reach;in bed;with bed alarm set   Nurse Communication          Time: 1610-9604 OT Time Calculation (min): 19 min  Charges: OT General Charges $OT Visit: 1 Visit OT Treatments $Therapeutic Activity: 8-22 mins    Galen Manila 10/28/2023, 12:17 PM

## 2023-10-28 NOTE — Discharge Summary (Signed)
 Physician Discharge Summary  Todd Bells. ZOX:[id number] DOB: [birth year] DOA: 10/10/2023  PCP: Blima Singer, PA  Admit date: 10/10/2023 Discharge date: 10/28/2023  Time spent: 44 minutes  Recommendations for Outpatient Follow-up:  Needs Chem-12 CBC in 1 to 2 weeks at PCP office Discharging to motel but will get home health OT PT social work to assist with mobility and also planning and will need a rollator Will need outpatient follow-up for refill of meds Recommend A1c and  discussion about diet etc.  Discharge Diagnoses:  MAIN problem for hospitalization   Episodic SVT and pneumonia  Please see below for itemized issues addressed in HOpsital- refer to other progress notes for clarity if needed  Discharge Condition: Improved  Diet recommendation: Heart healthy  Filed Weights   10/26/23 0457 10/27/23 0500 10/28/23 0500  Weight: 96.2 kg 95 kg 93.3 kg    History of present illness:  68 year old black male Known underlying depression/bipolar 1 Prior PE and DVT DM TY 2 Polysubstance abuse + cocaine + alcohol back in 2023 A-fib RVR noted back in January 2023 Chronic back pain chronic left knee pain and likely gout in the past   2/22 present [facility] ED cough-Rx doxycycline prednisone Followed by orthopedics several days later heart rates 100s-found to be in rapid A-fib Rx adenosine and despite this repeated attempted cardioversion was performed Placed on Cardizem gtt. 2/27 coronary CTA showed minimal nonobstructive CAD coronary calcification 32 2/25 echocardiogram EF 55-60% normal RV function normal mitral valve     Plan   SVT in setting of atrial flutter-nonresponsive to adenosine and DCCV cardioverted in ED Patient will go home on Zio patch as an outpatient and EP will follow-up-cardiology was consulted at discharge to assist with planning for this Transitioned from Cardizem gtt. 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 this hospital stay Amlodipine discontinued this hospital stay in favor of other meds   Community-acquired pneumonia with bronchitis Initial treatment as outpatient with doxycycline While hospitalized Rx 2/22 through 2/27 with Doxy and Rocephin and discontinued and patient is stable Last CXR 2/24 improved infiltrates-no cough no cold and seems better   AKI on admission with underlying CKD 2 Improved-no further workup-avoid nephrotoxins   NIDDM A1c 5.8 CBGs low 100 range A1c less than 6 Stop CBG checks stop insulin checks Needs outpatient reeval and discussion   Chronic lower extremity osteoarthritis knee At home takes Xtampza 1 capsule twice daily-currently on oxycodone 1 tab every 6 as needed pain and I have given him a prescription of this for pain He has been told to take Tylenol >any NSAIDs--- he will get a rollator for his pain   Tobacco abuse EtOH prior cocaine  cessation counseled    anxiety Was noncompliant with BuSpar will take trazodone 300 at bedtime, prescribed Cymbalta 40 and will discharge on a limited prescription of Atarax 25 3 times daily additionally   Dysphagia Stable   Discharge Exam: Vitals:   10/28/23 0608 10/28/23 0814  BP: 127/88 134/81  Pulse: 73 71  Resp: 18   Temp: (!) 97.4 F (36.3 C) 97.7 F (36.5 C)  SpO2: 94% 93%    Subj on day of d/c   Awake coherent alert no distress no complaints overnight  General Exam on discharge  EOMI NCAT no focal deficit no icterus no pallor no wheeze no rales no rhonchi Chest clear no added sound ROM intact S1-S2 no murmur Power  5/5 Psych euthymic  Discharge Instructions   Discharge Instructions     Diet - low sodium heart healthy   Complete by: As directed    Discharge instructions   Complete by: As directed    This hospital stay you were diagnosed with an irregular heartbeat and you were shocked out of it-you will need to maintain the  Cardizem which will help control your heart rate and follow-up in the outpatient setting with your regular physician You are also treated for pneumonia We noticed that you were not taking several medications that you needed to for anxiety depression so I have refilled your Cymbalta hydroxyzine etc. as well as your trazodone-some of your meds have changed I have given you a limited prescription of oxycodone for pain please be careful taking over-the-counter meds for knee pain you can use Tylenol do not use Naprosyn long-term Please follow-up with your primary physician in about 1 week We will obtain a walker for you   Increase activity slowly   Complete by: As directed       Allergies as of 10/28/2023   No Known Allergies      Medication List     STOP taking these medications    amLODipine 5 MG tablet Commonly known as: NORVASC   busPIRone 10 MG tablet Commonly known as: BUSPAR   methocarbamol 750 MG tablet Commonly known as: ROBAXIN   naproxen 250 MG tablet Commonly known as: NAPROSYN   oxycodone 5 MG capsule Commonly known as: OXY-IR   sertraline 50 MG tablet Commonly known as: ZOLOFT   Xtampza ER 9 MG C12a Generic drug: oxyCODONE ER       TAKE these medications    allopurinol 100 MG tablet Commonly known as: ZYLOPRIM Take 200 mg by mouth daily.   diltiazem 180 MG 24 hr capsule Commonly known as: CARDIZEM CD Take 1 capsule (180 mg total) by mouth daily.   DULoxetine HCl 40 MG Cpep Take 1 capsule (40 mg total) by mouth daily.   hydrOXYzine 25 MG tablet Commonly known as: ATARAX Take 1 tablet (25 mg total) by mouth 3 (three) times daily as needed for anxiety.   oxyCODONE-acetaminophen 5-325 MG tablet Commonly known as: PERCOCET/ROXICET Take 1 tablet by mouth every 6 (six) hours as needed for moderate pain (pain score 4-6).   trazodone 300 MG tablet Commonly known as: DESYREL Take 300 mg by mouth at bedtime. What changed: Another medication with the  same name was removed. Continue taking this medication, and follow the directions you see here.   Vitamin D (Ergocalciferol) 1.25 MG (50000 UNIT) Caps capsule Commonly known as: DRISDOL Take 50,000 Units by mouth every 7 (seven) days.               Durable Medical Equipment  (From admission, onward)           Start     Ordered   10/27/23 1632  For home use only DME 4 wheeled rolling walker with seat  Once       Question:  Patient needs a walker to treat with the following condition  Answer:  Generalized weakness   10/27/23 1631           No Known Allergies  Follow-up Information     Azalee Course, PA Follow up.   Specialties: Cardiology, Radiology Why: Wednesday Nov 09, 2023 Appt at 8:25 AM (25 min) Contact information: [address] [place] [address] [phone]  [facility] Follow up.   Why: [employer] will deliver a Rolator to your hospital room before you are discharged. Contact information: [address] [place] [address] [phone]         [facility] Follow up.   Specialty: Home Health Services Why: [facility] will provide home health services.  They will call you in the next 24-48 hours to set up home health services. Contact information: [address] [place] [address] [phone]                  The results of significant diagnostics from this hospitalization (including imaging, microbiology, ancillary and laboratory) are listed below for reference.    Significant Diagnostic Studies: CT CORONARY MORPH W/CTA COR W/SCORE W/CA W/CM &/OR WO/CM Result Date: 10/13/2023 HISTORY: 68 yo male with chest pain/anginal equiv, ECGs and troponins normal EXAM: Cardiac/Coronary CTA TECHNIQUE: The patient was scanned on a [employer]. PROTOCOL: A 120 kV retrospective scan was triggered in the descending thoracic aorta at 111 HU's. Axial non-contrast 3 mm slices  were carried out through the heart. The data set was analyzed on a dedicated work station and scored using the Agatson method. Gantry rotation speed was 250 msecs and collimation was .6 mm. Beta blockade and 0.8 mg of sl NTG was given. The 3D data set was reconstructed in 10% intervals of the 10-90 % of the R-R cycle. Diastolic phases were analyzed on a dedicated work station using MPR, MIP and VRT modes. The patient received 95 cc Omnipaque contrast. FINDINGS: Quality: Fair, HR 48, attenuation artifact, mis-registration Coronary calcium score: The patient's coronary artery calcium score is 32.5, which places the patient in the 57th percentile [facility]). Coronary arteries: Normal coronary origins.  Right dominance. Right Coronary Artery: Dominant. No disease. Normal R-PLB and R-PDA branches. Left Main Coronary Artery: Normal. Bifurcates into the LAD and LCx arteries. Left Anterior Descending Coronary Artery: Large anterior artery that wraps around the apex. Minimal mixed proximal 1-24% stenosis. Left Circumflex Artery: AV groove vessel with minimal 1-24% non-calcified stenosis in the mid-vessel. Large OM1 branch which bifurcates, no disease. Small distal OM2 branch, no disease. Aorta: Normal size, 36 mm at the mid ascending aorta (level of the PA bifurcation) measured double oblique. Aortic atherosclerosis. No dissection. Aortic Valve: Trileaflet. Punctate calcifications. Other findings: Normal pulmonary vein drainage into the left atrium. Normal left atrial appendage without a thrombus. Dilated main pulmonary artery to 30 mm, suggestive of pulmonary hypertension. IMPRESSION: 1. Minimal mixed non-obstructive CAD, CADRADS = 1. 2. Coronary artery calcium score is 32.5, which places the patient in the 57th percentile for age/race and sex-matched controls (MESA). 3. Normal coronary origin with right dominance. 4. Aortic atherosclerosis.  Punctate aortic valve calcifications. 5. Dilated main pulmonary artery to 30 mm,  suggestive of pulmonary hypertension. Electronically Signed   By: Chrystie Nose M.D.   On: 10/13/2023 14:00   DG ESOPHAGUS W SINGLE CM (SOL OR THIN BA) Result Date: 10/12/2023 CLINICAL DATA:  68 year old male with complaint of "food not going down," globus sensation. EXAM: ESOPHAGUS/BARIUM SWALLOW/TABLET STUDY TECHNIQUE: Single contrast examination was performed using thin liquid barium. This exam was performed by Loyce Dys PA-C, and was supervised and interpreted by Gilmer Mor, DO. FLUOROSCOPY: Radiation Exposure Index (as provided by the fluoroscopic device): 28.1 mGy Kerma COMPARISON:  None Available. FINDINGS: Swallowing: Appears normal. No vestibular penetration or aspiration seen. Pharynx: Unremarkable. Esophagus: Normal appearance. Esophageal motility: Limited assessment shows  tertiary contractions present without effect on progression of barium bolus. Hiatal Hernia: Small, sliding-type hiatal hernia Gastroesophageal reflux: None visualized spontaneously or able to be induced. Ingested 13mm barium tablet: Became stuck at the gastroesophageal junction and did not pass after several minutes. Other: Limited study due to patient mobility and clinical status IMPRESSION: Small, sliding-type hiatal hernia. Barium tablet was suspended indefinitely at the hiatal hernia. Tertiary contractions present. Electronically Signed   By: Gilmer Mor D.O.   On: 10/12/2023 10:07   ECHOCARDIOGRAM COMPLETE Result Date: 10/11/2023    ECHOCARDIOGRAM REPORT   Patient Name:   Todd [name]. Date of Exam: 10/11/2023 Medical Rec #:  [id number]          Height:       69.0 in Accession #:    [id number]         Weight:       200.0 lb Date of Birth:  [birth year]          BSA:          2.066 m Patient Age:    59 years           BP:           110/80 mmHg Patient Gender: M                  HR:           78 bpm. Exam Location:  Inpatient Procedure: 2D Echo (Both Spectral and Color Flow Doppler were utilized during             procedure). Indications:    SVT (supraventricular tachycardia) (HCC) [202906]  History:        Patient has prior history of Echocardiogram examinations, most                 recent 08/31/2021. ETOH, Cocaine and opiate abuse,                 Arrythmias:RBBB and Atrial Flutter; Risk Factors:Hypertension                 and Current Smoker.  Sonographer:    Dondra Prader RVT RCS Referring Phys: [id number] Lynda Rainwater A ACHARYA IMPRESSIONS  1. Left ventricular ejection fraction, by estimation, is 55 to 60%. The left ventricle has normal function. The left ventricle has no regional wall motion abnormalities. Left ventricular diastolic parameters were normal.  2. Right ventricular systolic function is normal. The right ventricular size is normal. There is mildly elevated pulmonary artery systolic pressure.  3. Left atrial size was mildly dilated.  4. The mitral valve is normal in structure. Trivial mitral valve regurgitation. No evidence of mitral stenosis.  5. The aortic valve is tricuspid. Aortic valve regurgitation is not visualized. No aortic stenosis is present.  6. The inferior vena cava is dilated in size with <50% respiratory variability, suggesting right atrial pressure of 15 mmHg. FINDINGS  Left Ventricle: Left ventricular ejection fraction, by estimation, is 55 to 60%. The left ventricle has normal function. The left ventricle has no regional wall motion abnormalities. Strain imaging was not performed. The left ventricular internal cavity  size was normal in size. There is no left ventricular hypertrophy. Left ventricular diastolic parameters were normal. Right Ventricle: The right ventricular size is normal. No increase in right ventricular wall thickness. Right ventricular systolic function is normal. There is mildly elevated pulmonary artery systolic pressure. The tricuspid regurgitant velocity is 2.66  m/s, and with an assumed right atrial pressure  of 15 mmHg, the estimated right ventricular systolic pressure is 43.3  mmHg. Left Atrium: Left atrial size was mildly dilated. Right Atrium: Right atrial size was normal in size. Pericardium: There is no evidence of pericardial effusion. Mitral Valve: The mitral valve is normal in structure. Trivial mitral valve regurgitation. No evidence of mitral valve stenosis. Tricuspid Valve: The tricuspid valve is normal in structure. Tricuspid valve regurgitation is mild . No evidence of tricuspid stenosis. Aortic Valve: The aortic valve is tricuspid. Aortic valve regurgitation is not visualized. No aortic stenosis is present. Aortic valve mean gradient measures 4.5 mmHg. Aortic valve peak gradient measures 7.7 mmHg. Aortic valve area, by VTI measures 3.06 cm. Pulmonic Valve: The pulmonic valve was normal in structure. Pulmonic valve regurgitation is trivial. No evidence of pulmonic stenosis. Aorta: The aortic root is normal in size and structure. Venous: The inferior vena cava is dilated in size with less than 50% respiratory variability, suggesting right atrial pressure of 15 mmHg. IAS/Shunts: No atrial level shunt detected by color flow Doppler. Additional Comments: 3D imaging was not performed.  LEFT VENTRICLE PLAX 2D LVIDd:         4.90 cm   Diastology LVIDs:         2.90 cm   LV e' medial:    6.96 cm/s LV PW:         1.20 cm   LV E/e' medial:  11.8 LV IVS:        1.00 cm   LV e' lateral:   11.00 cm/s LVOT diam:     2.10 cm   LV E/e' lateral: 7.5 LV SV:         80 LV SV Index:   39 LVOT Area:     3.46 cm  RIGHT VENTRICLE             IVC RV Basal diam:  3.10 cm     IVC diam: 2.40 cm RV Mid diam:    2.30 cm RV S prime:     10.40 cm/s TAPSE (M-mode): 2.4 cm LEFT ATRIUM             Index        RIGHT ATRIUM           Index LA diam:        4.50 cm 2.18 cm/m   RA Area:     17.00 cm LA Vol (A2C):   64.9 ml 31.41 ml/m  RA Volume:   41.80 ml  20.23 ml/m LA Vol (A4C):   69.2 ml 33.49 ml/m LA Biplane Vol: 75.4 ml 36.50 ml/m  AORTIC VALVE                    PULMONIC VALVE AV Area (Vmax):     2.99 cm     PV Vmax:       0.68 m/s AV Area (Vmean):   3.01 cm     PV Peak grad:  1.8 mmHg AV Area (VTI):     3.06 cm AV Vmax:           139.00 cm/s AV Vmean:          93.850 cm/s AV VTI:            0.263 m AV Peak Grad:      7.7 mmHg AV Mean Grad:      4.5 mmHg LVOT Vmax:         120.00 cm/s LVOT Vmean:  81.500 cm/s LVOT VTI:          0.232 m LVOT/AV VTI ratio: 0.88  AORTA Ao Root diam: 3.40 cm Ao Asc diam:  3.60 cm MITRAL VALVE               TRICUSPID VALVE MV Area (PHT): 4.21 cm    TR Peak grad:   28.3 mmHg MV Decel Time: 180 msec    TR Vmax:        266.00 cm/s MV E velocity: 82.10 cm/s MV A velocity: 67.40 cm/s  SHUNTS MV E/A ratio:  1.22        Systemic VTI:  0.23 m                            Systemic Diam: 2.10 cm Clearnce Hasten Electronically signed by Clearnce Hasten Signature Date/Time: 10/11/2023/11:49:31 AM    Final    DG Chest Port 1 View Result Date: 10/10/2023 CLINICAL DATA:  Supraventricular tachycardia. History of DVT and pulmonary embolus. EXAM: PORTABLE CHEST 1 VIEW COMPARISON:  10/08/2023 FINDINGS: Mild cardiac enlargement. Peribronchial thickening with central interstitial changes consistent with chronic bronchitis. Unfortunately a patches placed over the left mid and lower lung zone which limits evaluation but superimposed infiltrates seen on prior study on the left appear improved. No pleural effusion or pneumothorax. Mediastinal contours appear intact. IMPRESSION: Chronic bronchitic changes in the lungs. Probable improvement of previous left lung infiltrates. Electronically Signed   By: Burman Nieves M.D.   On: 10/10/2023 22:30   DG Shoulder Right Result Date: 10/08/2023 CLINICAL DATA:  Cough and joint aches. EXAM: RIGHT SHOULDER - 2+ VIEW COMPARISON:  Right shoulder x-ray 02/15/2022 FINDINGS: There is no acute fracture or dislocation. There severe subacromial joint space narrowing. Bones are osteopenic. Soft tissues are within normal limits. IMPRESSION: 1. No acute fracture  or dislocation. 2. Severe subacromial joint space narrowing can be seen with chronic rotator cuff tendinopathy. Electronically Signed   By: Darliss Cheney M.D.   On: 10/08/2023 20:05   DG Knee 2 Views Right Result Date: 10/08/2023 CLINICAL DATA:  Right knee pain EXAM: RIGHT KNEE - 1-2 VIEW COMPARISON:  05/11/2023 FINDINGS: Frontal and lateral views of the right knee demonstrate moderate 3 compartmental osteoarthritis, with chondrocalcinosis again noted in the lateral compartment. Patella baja again noted, with continued calcifications in the distal quadriceps tendon region. No acute fracture. Small joint effusion. Soft tissues are unremarkable. IMPRESSION: 1. Stable 3 compartmental osteoarthritis and small reactive joint effusion. 2. Stable patella baja, with calcifications within the distal quadriceps tendon unchanged. Electronically Signed   By: Sharlet Salina M.D.   On: 10/08/2023 20:02   DG Knee 2 Views Left Result Date: 10/08/2023 CLINICAL DATA:  Pain EXAM: LEFT KNEE - 1-2 VIEW COMPARISON:  02/15/2022 FINDINGS: Frontal and lateral views of the left knee are obtained. No fracture, subluxation, or dislocation. There is severe 3 compartmental osteoarthritis, most pronounced in the lateral compartment. Prominent enthesopathic changes along the upper pole of the patella. Small joint effusion. Soft tissues are unremarkable. IMPRESSION: 1. Severe 3 compartmental osteoarthritis and small reactive joint effusion. 2. No acute fracture. Electronically Signed   By: Sharlet Salina M.D.   On: 10/08/2023 20:01   DG Shoulder Left Result Date: 10/08/2023 CLINICAL DATA:  Shoulder pain EXAM: LEFT SHOULDER - 2+ VIEW COMPARISON:  02/15/2022 FINDINGS: Frontal and transscapular views of the left shoulder are obtained. No acute displaced fracture. Prominent spurring along the undersurface of  the acromion process. Moderate acromioclavicular joint osteoarthritis. Glenohumeral joint space is well preserved. Soft tissues are  unremarkable. Left chest is clear. IMPRESSION: 1. Stable acromioclavicular joint osteoarthritis and spurring of the acromion process. 2. No acute displaced fracture. Electronically Signed   By: Sharlet Salina M.D.   On: 10/08/2023 20:00   DG Chest 2 View Result Date: 10/08/2023 CLINICAL DATA:  Cough EXAM: CHEST - 2 VIEW COMPARISON:  08/30/2021 FINDINGS: Frontal and lateral views of the chest demonstrate a stable cardiac silhouette. Background emphysema and parenchymal lung scarring again noted. There is increased density in the left suprahilar region which may reflect superimposed airspace disease. No effusion or pneumothorax. No acute bony abnormalities. IMPRESSION: 1. Increased density in the left suprahilar region, consistent with airspace disease superimposed upon chronic scarring and emphysema. Electronically Signed   By: Sharlet Salina M.D.   On: 10/08/2023 19:56    Microbiology: No results found for this or any previous visit (from the past 240 hours).   Labs: Basic Metabolic Panel: Recent Labs  Lab 10/22/23 0713 10/23/23 0851 10/24/23 0741 10/25/23 0555 10/26/23 0612 10/27/23 0741  NA 138 138 138 137 135 136  K 4.5 4.2 4.7 4.4 4.1 4.1  CL 105 103 108 105 106 104  CO2 24 23 24 26 24  21*  GLUCOSE 103* 116* 108* 103* 103* 156*  BUN 23 21 22 19 22 22   CREATININE 1.40* 1.12 1.29* 1.36* 1.32* 1.15  CALCIUM 9.3 10.0 9.6 9.8 9.4 9.4  MG 1.9 1.8 1.7 1.7 1.7 1.7  PHOS 3.7 3.7 3.4 3.7 4.0  --    Liver Function Tests: Recent Labs  Lab 10/23/23 0851 10/24/23 0741 10/25/23 0555 10/26/23 0612 10/27/23 0741  AST 20 19 21 19 21   ALT 13 13 11 12 13   ALKPHOS 41 43 41 40 40  BILITOT 0.3 0.5 0.5 0.5 0.5  PROT 6.4* 6.9 6.8 6.6 7.2  ALBUMIN 3.1* 3.0* 3.0* 3.0* 3.4*   No results for input(s): "LIPASE", "AMYLASE" in the last 168 hours. No results for input(s): "AMMONIA" in the last 168 hours. CBC: Recent Labs  Lab 10/23/23 0851 10/24/23 0741 10/25/23 0555 10/26/23 0612  10/27/23 0741  WBC 3.8* 3.6* 4.1 4.1 4.6  NEUTROABS 1.1* 1.2* 1.4* 1.2* 2.0  HGB 13.2 13.4 13.3 12.9* 13.9  HCT 39.8 41.3 40.3 38.7* 42.1  MCV 98.8 100.2* 100.0 98.5 98.4  PLT 262 254 249 253 259   Cardiac Enzymes: No results for input(s): "CKTOTAL", "CKMB", "CKMBINDEX", "TROPONINI" in the last 168 hours. BNP: BNP (last 3 results) Recent Labs    10/10/23 1425  BNP 812.3*    ProBNP (last 3 results) No results for input(s): "PROBNP" in the last 8760 hours.  CBG: Recent Labs  Lab 10/26/23 0808 10/26/23 1256 10/26/23 1605 10/26/23 1959 10/27/23 1620  GLUCAP 144* 138* 115* 101* 121*    Signed:  Rhetta Mura MD   [facility] 10/28/2023, 10:04 AM

## 2023-10-28 NOTE — Progress Notes (Signed)
 AVS printed and placed at nurses station.

## 2023-10-28 NOTE — TOC Progression Note (Signed)
 Transition of Care (TOC) - Progression Note    Patient Details  Name: Todd [name]. MRN: [id number] Date of Birth: Jun 18, 1956  Transition of Care [facility]) CM/SW Contact  Janae Bridgeman, RN Phone Number: 10/28/2023, 10:17 AM  Clinical Narrative:    CM met with the patient this morning and rolator is planned to deliver to the hospital room this morning.  [facility] for home health services and will follow the patient for services to the hotel and then to the ILF with Trenton Gammon, Owner.  Patient is planned to discharge to ILF  next week        Expected Discharge Plan and Services         Expected Discharge Date: 10/28/23                                     Social Determinants of Health (SDOH) Interventions SDOH Screenings   Food Insecurity: Food Insecurity Present (10/11/2023)  Housing: High Risk (10/11/2023)  Transportation Needs: Unmet Transportation Needs (10/11/2023)  Utilities: Not At Risk (10/11/2023)  Alcohol Screen: Low Risk  (04/20/2023)  Financial Resource Strain: Low Risk  (10/27/2022)   Received from [facility]  Physical Activity: Inactive (10/27/2022)   Received from [facility]  Social Connections: Socially Isolated (10/11/2023)  Stress: Stress Concern Present (10/27/2022)   Received from [facility]  Tobacco Use: High Risk (10/10/2023)  Health Literacy: Low Risk  (10/27/2022)   Received from [facility]    Readmission Risk Interventions    10/11/2023    3:09 PM 09/02/2021    1:12 PM  Readmission Risk Prevention Plan  Transportation Screening Complete Complete  PCP or Specialist Appt within 3-5 Days Complete   HRI or Home Care Consult Complete Complete  Social Work Consult for Recovery Care Planning/Counseling Complete Complete  Palliative Care Screening Complete Not Applicable  Medication Review [employer]) Complete

## 2023-10-28 NOTE — Plan of Care (Signed)

## 2023-10-31 ENCOUNTER — Ambulatory Visit: Payer: Medicare HMO | Admitting: Physician Assistant

## 2023-11-09 ENCOUNTER — Ambulatory Visit: Payer: Medicare HMO | Attending: Physician Assistant | Admitting: Physician Assistant

## 2023-11-09 ENCOUNTER — Ambulatory Visit: Payer: Medicare HMO | Admitting: Physician Assistant

## 2023-11-10 NOTE — Progress Notes (Signed)
 This encounter was created in error - please disregard.

## 2023-11-28 DIAGNOSIS — Z9181 History of falling: Secondary | ICD-10-CM | POA: Diagnosis not present

## 2023-11-28 DIAGNOSIS — G4700 Insomnia, unspecified: Secondary | ICD-10-CM | POA: Diagnosis not present

## 2023-11-28 DIAGNOSIS — H538 Other visual disturbances: Secondary | ICD-10-CM | POA: Diagnosis not present

## 2023-11-28 DIAGNOSIS — Z604 Social exclusion and rejection: Secondary | ICD-10-CM | POA: Diagnosis not present

## 2023-11-28 DIAGNOSIS — Z556 Problems related to health literacy: Secondary | ICD-10-CM | POA: Diagnosis not present

## 2023-11-28 DIAGNOSIS — Z86718 Personal history of other venous thrombosis and embolism: Secondary | ICD-10-CM | POA: Diagnosis not present

## 2023-11-28 DIAGNOSIS — E119 Type 2 diabetes mellitus without complications: Secondary | ICD-10-CM | POA: Diagnosis not present

## 2023-11-28 DIAGNOSIS — E7800 Pure hypercholesterolemia, unspecified: Secondary | ICD-10-CM | POA: Diagnosis not present

## 2023-11-28 DIAGNOSIS — G4730 Sleep apnea, unspecified: Secondary | ICD-10-CM | POA: Diagnosis not present

## 2023-11-28 DIAGNOSIS — R000 Tachycardia, unspecified: Secondary | ICD-10-CM | POA: Diagnosis not present

## 2023-11-28 DIAGNOSIS — Z86711 Personal history of pulmonary embolism: Secondary | ICD-10-CM | POA: Diagnosis not present

## 2023-11-28 DIAGNOSIS — I10 Essential (primary) hypertension: Secondary | ICD-10-CM | POA: Diagnosis not present

## 2023-11-28 DIAGNOSIS — G8929 Other chronic pain: Secondary | ICD-10-CM | POA: Diagnosis not present

## 2023-11-28 DIAGNOSIS — M170 Bilateral primary osteoarthritis of knee: Secondary | ICD-10-CM | POA: Diagnosis not present

## 2023-11-28 DIAGNOSIS — Z5982 Transportation insecurity: Secondary | ICD-10-CM | POA: Diagnosis not present

## 2023-11-28 DIAGNOSIS — F17210 Nicotine dependence, cigarettes, uncomplicated: Secondary | ICD-10-CM | POA: Diagnosis not present

## 2023-12-01 DIAGNOSIS — M170 Bilateral primary osteoarthritis of knee: Secondary | ICD-10-CM | POA: Diagnosis not present

## 2023-12-01 DIAGNOSIS — M25512 Pain in left shoulder: Secondary | ICD-10-CM | POA: Diagnosis not present

## 2023-12-01 DIAGNOSIS — M25511 Pain in right shoulder: Secondary | ICD-10-CM | POA: Diagnosis not present

## 2023-12-06 DIAGNOSIS — E7800 Pure hypercholesterolemia, unspecified: Secondary | ICD-10-CM | POA: Diagnosis not present

## 2023-12-06 DIAGNOSIS — Z5982 Transportation insecurity: Secondary | ICD-10-CM | POA: Diagnosis not present

## 2023-12-06 DIAGNOSIS — Z86711 Personal history of pulmonary embolism: Secondary | ICD-10-CM | POA: Diagnosis not present

## 2023-12-06 DIAGNOSIS — I10 Essential (primary) hypertension: Secondary | ICD-10-CM | POA: Diagnosis not present

## 2023-12-06 DIAGNOSIS — Z556 Problems related to health literacy: Secondary | ICD-10-CM | POA: Diagnosis not present

## 2023-12-06 DIAGNOSIS — R000 Tachycardia, unspecified: Secondary | ICD-10-CM | POA: Diagnosis not present

## 2023-12-06 DIAGNOSIS — G4700 Insomnia, unspecified: Secondary | ICD-10-CM | POA: Diagnosis not present

## 2023-12-06 DIAGNOSIS — Z9181 History of falling: Secondary | ICD-10-CM | POA: Diagnosis not present

## 2023-12-06 DIAGNOSIS — E119 Type 2 diabetes mellitus without complications: Secondary | ICD-10-CM | POA: Diagnosis not present

## 2023-12-06 DIAGNOSIS — Z604 Social exclusion and rejection: Secondary | ICD-10-CM | POA: Diagnosis not present

## 2023-12-06 DIAGNOSIS — G4730 Sleep apnea, unspecified: Secondary | ICD-10-CM | POA: Diagnosis not present

## 2023-12-06 DIAGNOSIS — H538 Other visual disturbances: Secondary | ICD-10-CM | POA: Diagnosis not present

## 2023-12-06 DIAGNOSIS — Z86718 Personal history of other venous thrombosis and embolism: Secondary | ICD-10-CM | POA: Diagnosis not present

## 2023-12-06 DIAGNOSIS — G8929 Other chronic pain: Secondary | ICD-10-CM | POA: Diagnosis not present

## 2023-12-06 DIAGNOSIS — F17210 Nicotine dependence, cigarettes, uncomplicated: Secondary | ICD-10-CM | POA: Diagnosis not present

## 2023-12-06 DIAGNOSIS — M170 Bilateral primary osteoarthritis of knee: Secondary | ICD-10-CM | POA: Diagnosis not present

## 2023-12-08 DIAGNOSIS — M1712 Unilateral primary osteoarthritis, left knee: Secondary | ICD-10-CM | POA: Diagnosis not present

## 2023-12-11 DIAGNOSIS — G8929 Other chronic pain: Secondary | ICD-10-CM | POA: Diagnosis not present

## 2023-12-11 DIAGNOSIS — R000 Tachycardia, unspecified: Secondary | ICD-10-CM | POA: Diagnosis not present

## 2023-12-11 DIAGNOSIS — E7800 Pure hypercholesterolemia, unspecified: Secondary | ICD-10-CM | POA: Diagnosis not present

## 2023-12-11 DIAGNOSIS — M170 Bilateral primary osteoarthritis of knee: Secondary | ICD-10-CM | POA: Diagnosis not present

## 2023-12-11 DIAGNOSIS — H538 Other visual disturbances: Secondary | ICD-10-CM | POA: Diagnosis not present

## 2023-12-11 DIAGNOSIS — Z9181 History of falling: Secondary | ICD-10-CM | POA: Diagnosis not present

## 2023-12-11 DIAGNOSIS — Z86711 Personal history of pulmonary embolism: Secondary | ICD-10-CM | POA: Diagnosis not present

## 2023-12-11 DIAGNOSIS — Z604 Social exclusion and rejection: Secondary | ICD-10-CM | POA: Diagnosis not present

## 2023-12-11 DIAGNOSIS — F17210 Nicotine dependence, cigarettes, uncomplicated: Secondary | ICD-10-CM | POA: Diagnosis not present

## 2023-12-11 DIAGNOSIS — G4700 Insomnia, unspecified: Secondary | ICD-10-CM | POA: Diagnosis not present

## 2023-12-11 DIAGNOSIS — G4730 Sleep apnea, unspecified: Secondary | ICD-10-CM | POA: Diagnosis not present

## 2023-12-11 DIAGNOSIS — E119 Type 2 diabetes mellitus without complications: Secondary | ICD-10-CM | POA: Diagnosis not present

## 2023-12-11 DIAGNOSIS — I10 Essential (primary) hypertension: Secondary | ICD-10-CM | POA: Diagnosis not present

## 2023-12-11 DIAGNOSIS — Z5982 Transportation insecurity: Secondary | ICD-10-CM | POA: Diagnosis not present

## 2023-12-11 DIAGNOSIS — Z86718 Personal history of other venous thrombosis and embolism: Secondary | ICD-10-CM | POA: Diagnosis not present

## 2023-12-11 DIAGNOSIS — Z556 Problems related to health literacy: Secondary | ICD-10-CM | POA: Diagnosis not present

## 2023-12-13 DIAGNOSIS — Z5982 Transportation insecurity: Secondary | ICD-10-CM | POA: Diagnosis not present

## 2023-12-13 DIAGNOSIS — Z556 Problems related to health literacy: Secondary | ICD-10-CM | POA: Diagnosis not present

## 2023-12-13 DIAGNOSIS — Z604 Social exclusion and rejection: Secondary | ICD-10-CM | POA: Diagnosis not present

## 2023-12-13 DIAGNOSIS — G4730 Sleep apnea, unspecified: Secondary | ICD-10-CM | POA: Diagnosis not present

## 2023-12-13 DIAGNOSIS — I10 Essential (primary) hypertension: Secondary | ICD-10-CM | POA: Diagnosis not present

## 2023-12-13 DIAGNOSIS — G8929 Other chronic pain: Secondary | ICD-10-CM | POA: Diagnosis not present

## 2023-12-13 DIAGNOSIS — Z86718 Personal history of other venous thrombosis and embolism: Secondary | ICD-10-CM | POA: Diagnosis not present

## 2023-12-13 DIAGNOSIS — Z86711 Personal history of pulmonary embolism: Secondary | ICD-10-CM | POA: Diagnosis not present

## 2023-12-13 DIAGNOSIS — M170 Bilateral primary osteoarthritis of knee: Secondary | ICD-10-CM | POA: Diagnosis not present

## 2023-12-13 DIAGNOSIS — R000 Tachycardia, unspecified: Secondary | ICD-10-CM | POA: Diagnosis not present

## 2023-12-13 DIAGNOSIS — Z9181 History of falling: Secondary | ICD-10-CM | POA: Diagnosis not present

## 2023-12-13 DIAGNOSIS — H538 Other visual disturbances: Secondary | ICD-10-CM | POA: Diagnosis not present

## 2023-12-13 DIAGNOSIS — E7800 Pure hypercholesterolemia, unspecified: Secondary | ICD-10-CM | POA: Diagnosis not present

## 2023-12-13 DIAGNOSIS — G4700 Insomnia, unspecified: Secondary | ICD-10-CM | POA: Diagnosis not present

## 2023-12-13 DIAGNOSIS — E119 Type 2 diabetes mellitus without complications: Secondary | ICD-10-CM | POA: Diagnosis not present

## 2023-12-13 DIAGNOSIS — F17210 Nicotine dependence, cigarettes, uncomplicated: Secondary | ICD-10-CM | POA: Diagnosis not present

## 2023-12-19 DIAGNOSIS — M170 Bilateral primary osteoarthritis of knee: Secondary | ICD-10-CM | POA: Diagnosis not present

## 2023-12-19 DIAGNOSIS — Z604 Social exclusion and rejection: Secondary | ICD-10-CM | POA: Diagnosis not present

## 2023-12-19 DIAGNOSIS — Z9181 History of falling: Secondary | ICD-10-CM | POA: Diagnosis not present

## 2023-12-19 DIAGNOSIS — Z556 Problems related to health literacy: Secondary | ICD-10-CM | POA: Diagnosis not present

## 2023-12-19 DIAGNOSIS — F17210 Nicotine dependence, cigarettes, uncomplicated: Secondary | ICD-10-CM | POA: Diagnosis not present

## 2023-12-19 DIAGNOSIS — G4730 Sleep apnea, unspecified: Secondary | ICD-10-CM | POA: Diagnosis not present

## 2023-12-19 DIAGNOSIS — Z5982 Transportation insecurity: Secondary | ICD-10-CM | POA: Diagnosis not present

## 2023-12-19 DIAGNOSIS — Z86711 Personal history of pulmonary embolism: Secondary | ICD-10-CM | POA: Diagnosis not present

## 2023-12-19 DIAGNOSIS — R000 Tachycardia, unspecified: Secondary | ICD-10-CM | POA: Diagnosis not present

## 2023-12-19 DIAGNOSIS — G4700 Insomnia, unspecified: Secondary | ICD-10-CM | POA: Diagnosis not present

## 2023-12-19 DIAGNOSIS — E7800 Pure hypercholesterolemia, unspecified: Secondary | ICD-10-CM | POA: Diagnosis not present

## 2023-12-19 DIAGNOSIS — H538 Other visual disturbances: Secondary | ICD-10-CM | POA: Diagnosis not present

## 2023-12-19 DIAGNOSIS — I10 Essential (primary) hypertension: Secondary | ICD-10-CM | POA: Diagnosis not present

## 2023-12-19 DIAGNOSIS — Z86718 Personal history of other venous thrombosis and embolism: Secondary | ICD-10-CM | POA: Diagnosis not present

## 2023-12-19 DIAGNOSIS — G8929 Other chronic pain: Secondary | ICD-10-CM | POA: Diagnosis not present

## 2023-12-19 DIAGNOSIS — E119 Type 2 diabetes mellitus without complications: Secondary | ICD-10-CM | POA: Diagnosis not present

## 2023-12-20 DIAGNOSIS — G4700 Insomnia, unspecified: Secondary | ICD-10-CM | POA: Diagnosis not present

## 2023-12-20 DIAGNOSIS — G4730 Sleep apnea, unspecified: Secondary | ICD-10-CM | POA: Diagnosis not present

## 2023-12-20 DIAGNOSIS — M170 Bilateral primary osteoarthritis of knee: Secondary | ICD-10-CM | POA: Diagnosis not present

## 2023-12-20 DIAGNOSIS — Z86718 Personal history of other venous thrombosis and embolism: Secondary | ICD-10-CM | POA: Diagnosis not present

## 2023-12-20 DIAGNOSIS — Z5982 Transportation insecurity: Secondary | ICD-10-CM | POA: Diagnosis not present

## 2023-12-20 DIAGNOSIS — R000 Tachycardia, unspecified: Secondary | ICD-10-CM | POA: Diagnosis not present

## 2023-12-20 DIAGNOSIS — H538 Other visual disturbances: Secondary | ICD-10-CM | POA: Diagnosis not present

## 2023-12-20 DIAGNOSIS — F17210 Nicotine dependence, cigarettes, uncomplicated: Secondary | ICD-10-CM | POA: Diagnosis not present

## 2023-12-20 DIAGNOSIS — I10 Essential (primary) hypertension: Secondary | ICD-10-CM | POA: Diagnosis not present

## 2023-12-20 DIAGNOSIS — E7800 Pure hypercholesterolemia, unspecified: Secondary | ICD-10-CM | POA: Diagnosis not present

## 2023-12-20 DIAGNOSIS — Z9181 History of falling: Secondary | ICD-10-CM | POA: Diagnosis not present

## 2023-12-20 DIAGNOSIS — Z556 Problems related to health literacy: Secondary | ICD-10-CM | POA: Diagnosis not present

## 2023-12-20 DIAGNOSIS — Z86711 Personal history of pulmonary embolism: Secondary | ICD-10-CM | POA: Diagnosis not present

## 2023-12-20 DIAGNOSIS — E119 Type 2 diabetes mellitus without complications: Secondary | ICD-10-CM | POA: Diagnosis not present

## 2023-12-20 DIAGNOSIS — G8929 Other chronic pain: Secondary | ICD-10-CM | POA: Diagnosis not present

## 2023-12-20 DIAGNOSIS — Z604 Social exclusion and rejection: Secondary | ICD-10-CM | POA: Diagnosis not present

## 2023-12-21 DIAGNOSIS — D539 Nutritional anemia, unspecified: Secondary | ICD-10-CM | POA: Diagnosis not present

## 2023-12-21 DIAGNOSIS — M170 Bilateral primary osteoarthritis of knee: Secondary | ICD-10-CM | POA: Diagnosis not present

## 2023-12-21 DIAGNOSIS — Z79891 Long term (current) use of opiate analgesic: Secondary | ICD-10-CM | POA: Diagnosis not present

## 2023-12-21 DIAGNOSIS — E7800 Pure hypercholesterolemia, unspecified: Secondary | ICD-10-CM | POA: Diagnosis not present

## 2023-12-21 DIAGNOSIS — E559 Vitamin D deficiency, unspecified: Secondary | ICD-10-CM | POA: Diagnosis not present

## 2023-12-21 DIAGNOSIS — R7303 Prediabetes: Secondary | ICD-10-CM | POA: Diagnosis not present

## 2023-12-21 DIAGNOSIS — E538 Deficiency of other specified B group vitamins: Secondary | ICD-10-CM | POA: Diagnosis not present

## 2023-12-21 DIAGNOSIS — Z0000 Encounter for general adult medical examination without abnormal findings: Secondary | ICD-10-CM | POA: Diagnosis not present

## 2023-12-21 DIAGNOSIS — M129 Arthropathy, unspecified: Secondary | ICD-10-CM | POA: Diagnosis not present

## 2023-12-22 DIAGNOSIS — Z556 Problems related to health literacy: Secondary | ICD-10-CM | POA: Diagnosis not present

## 2023-12-22 DIAGNOSIS — E119 Type 2 diabetes mellitus without complications: Secondary | ICD-10-CM | POA: Diagnosis not present

## 2023-12-22 DIAGNOSIS — Z604 Social exclusion and rejection: Secondary | ICD-10-CM | POA: Diagnosis not present

## 2023-12-22 DIAGNOSIS — F17210 Nicotine dependence, cigarettes, uncomplicated: Secondary | ICD-10-CM | POA: Diagnosis not present

## 2023-12-22 DIAGNOSIS — M170 Bilateral primary osteoarthritis of knee: Secondary | ICD-10-CM | POA: Diagnosis not present

## 2023-12-22 DIAGNOSIS — Z5982 Transportation insecurity: Secondary | ICD-10-CM | POA: Diagnosis not present

## 2023-12-22 DIAGNOSIS — R000 Tachycardia, unspecified: Secondary | ICD-10-CM | POA: Diagnosis not present

## 2023-12-22 DIAGNOSIS — Z86711 Personal history of pulmonary embolism: Secondary | ICD-10-CM | POA: Diagnosis not present

## 2023-12-22 DIAGNOSIS — Z86718 Personal history of other venous thrombosis and embolism: Secondary | ICD-10-CM | POA: Diagnosis not present

## 2023-12-22 DIAGNOSIS — I10 Essential (primary) hypertension: Secondary | ICD-10-CM | POA: Diagnosis not present

## 2023-12-22 DIAGNOSIS — G4700 Insomnia, unspecified: Secondary | ICD-10-CM | POA: Diagnosis not present

## 2023-12-22 DIAGNOSIS — H538 Other visual disturbances: Secondary | ICD-10-CM | POA: Diagnosis not present

## 2023-12-22 DIAGNOSIS — Z9181 History of falling: Secondary | ICD-10-CM | POA: Diagnosis not present

## 2023-12-22 DIAGNOSIS — G8929 Other chronic pain: Secondary | ICD-10-CM | POA: Diagnosis not present

## 2023-12-22 DIAGNOSIS — G4730 Sleep apnea, unspecified: Secondary | ICD-10-CM | POA: Diagnosis not present

## 2023-12-22 DIAGNOSIS — E7800 Pure hypercholesterolemia, unspecified: Secondary | ICD-10-CM | POA: Diagnosis not present

## 2023-12-26 DIAGNOSIS — I471 Supraventricular tachycardia, unspecified: Secondary | ICD-10-CM | POA: Diagnosis not present

## 2023-12-26 DIAGNOSIS — M1712 Unilateral primary osteoarthritis, left knee: Secondary | ICD-10-CM | POA: Diagnosis not present

## 2023-12-26 DIAGNOSIS — R1319 Other dysphagia: Secondary | ICD-10-CM | POA: Diagnosis not present

## 2023-12-26 DIAGNOSIS — G894 Chronic pain syndrome: Secondary | ICD-10-CM | POA: Diagnosis not present

## 2023-12-28 DIAGNOSIS — Z9181 History of falling: Secondary | ICD-10-CM | POA: Diagnosis not present

## 2023-12-28 DIAGNOSIS — E7800 Pure hypercholesterolemia, unspecified: Secondary | ICD-10-CM | POA: Diagnosis not present

## 2023-12-28 DIAGNOSIS — Z5982 Transportation insecurity: Secondary | ICD-10-CM | POA: Diagnosis not present

## 2023-12-28 DIAGNOSIS — Z86711 Personal history of pulmonary embolism: Secondary | ICD-10-CM | POA: Diagnosis not present

## 2023-12-28 DIAGNOSIS — I10 Essential (primary) hypertension: Secondary | ICD-10-CM | POA: Diagnosis not present

## 2023-12-28 DIAGNOSIS — R000 Tachycardia, unspecified: Secondary | ICD-10-CM | POA: Diagnosis not present

## 2023-12-28 DIAGNOSIS — G4700 Insomnia, unspecified: Secondary | ICD-10-CM | POA: Diagnosis not present

## 2023-12-28 DIAGNOSIS — G8929 Other chronic pain: Secondary | ICD-10-CM | POA: Diagnosis not present

## 2023-12-28 DIAGNOSIS — Z604 Social exclusion and rejection: Secondary | ICD-10-CM | POA: Diagnosis not present

## 2023-12-28 DIAGNOSIS — Z79899 Other long term (current) drug therapy: Secondary | ICD-10-CM | POA: Diagnosis not present

## 2023-12-28 DIAGNOSIS — E119 Type 2 diabetes mellitus without complications: Secondary | ICD-10-CM | POA: Diagnosis not present

## 2023-12-28 DIAGNOSIS — M170 Bilateral primary osteoarthritis of knee: Secondary | ICD-10-CM | POA: Diagnosis not present

## 2023-12-28 DIAGNOSIS — Z86718 Personal history of other venous thrombosis and embolism: Secondary | ICD-10-CM | POA: Diagnosis not present

## 2023-12-28 DIAGNOSIS — Z556 Problems related to health literacy: Secondary | ICD-10-CM | POA: Diagnosis not present

## 2023-12-28 DIAGNOSIS — H538 Other visual disturbances: Secondary | ICD-10-CM | POA: Diagnosis not present

## 2023-12-28 DIAGNOSIS — F17210 Nicotine dependence, cigarettes, uncomplicated: Secondary | ICD-10-CM | POA: Diagnosis not present

## 2023-12-28 DIAGNOSIS — G4730 Sleep apnea, unspecified: Secondary | ICD-10-CM | POA: Diagnosis not present

## 2024-01-02 DIAGNOSIS — E7800 Pure hypercholesterolemia, unspecified: Secondary | ICD-10-CM | POA: Diagnosis not present

## 2024-01-02 DIAGNOSIS — Z9181 History of falling: Secondary | ICD-10-CM | POA: Diagnosis not present

## 2024-01-02 DIAGNOSIS — H538 Other visual disturbances: Secondary | ICD-10-CM | POA: Diagnosis not present

## 2024-01-02 DIAGNOSIS — Z86711 Personal history of pulmonary embolism: Secondary | ICD-10-CM | POA: Diagnosis not present

## 2024-01-02 DIAGNOSIS — G4700 Insomnia, unspecified: Secondary | ICD-10-CM | POA: Diagnosis not present

## 2024-01-02 DIAGNOSIS — Z556 Problems related to health literacy: Secondary | ICD-10-CM | POA: Diagnosis not present

## 2024-01-02 DIAGNOSIS — G8929 Other chronic pain: Secondary | ICD-10-CM | POA: Diagnosis not present

## 2024-01-02 DIAGNOSIS — G4730 Sleep apnea, unspecified: Secondary | ICD-10-CM | POA: Diagnosis not present

## 2024-01-02 DIAGNOSIS — M170 Bilateral primary osteoarthritis of knee: Secondary | ICD-10-CM | POA: Diagnosis not present

## 2024-01-02 DIAGNOSIS — Z604 Social exclusion and rejection: Secondary | ICD-10-CM | POA: Diagnosis not present

## 2024-01-02 DIAGNOSIS — I10 Essential (primary) hypertension: Secondary | ICD-10-CM | POA: Diagnosis not present

## 2024-01-02 DIAGNOSIS — Z5982 Transportation insecurity: Secondary | ICD-10-CM | POA: Diagnosis not present

## 2024-01-02 DIAGNOSIS — F17210 Nicotine dependence, cigarettes, uncomplicated: Secondary | ICD-10-CM | POA: Diagnosis not present

## 2024-01-02 DIAGNOSIS — E119 Type 2 diabetes mellitus without complications: Secondary | ICD-10-CM | POA: Diagnosis not present

## 2024-01-02 DIAGNOSIS — Z86718 Personal history of other venous thrombosis and embolism: Secondary | ICD-10-CM | POA: Diagnosis not present

## 2024-01-02 DIAGNOSIS — R000 Tachycardia, unspecified: Secondary | ICD-10-CM | POA: Diagnosis not present

## 2024-01-05 DIAGNOSIS — M1712 Unilateral primary osteoarthritis, left knee: Secondary | ICD-10-CM | POA: Diagnosis not present

## 2024-01-06 DIAGNOSIS — E119 Type 2 diabetes mellitus without complications: Secondary | ICD-10-CM | POA: Diagnosis not present

## 2024-01-06 DIAGNOSIS — E7800 Pure hypercholesterolemia, unspecified: Secondary | ICD-10-CM | POA: Diagnosis not present

## 2024-01-06 DIAGNOSIS — Z9181 History of falling: Secondary | ICD-10-CM | POA: Diagnosis not present

## 2024-01-06 DIAGNOSIS — Z5982 Transportation insecurity: Secondary | ICD-10-CM | POA: Diagnosis not present

## 2024-01-06 DIAGNOSIS — G4730 Sleep apnea, unspecified: Secondary | ICD-10-CM | POA: Diagnosis not present

## 2024-01-06 DIAGNOSIS — H538 Other visual disturbances: Secondary | ICD-10-CM | POA: Diagnosis not present

## 2024-01-06 DIAGNOSIS — G4700 Insomnia, unspecified: Secondary | ICD-10-CM | POA: Diagnosis not present

## 2024-01-06 DIAGNOSIS — Z604 Social exclusion and rejection: Secondary | ICD-10-CM | POA: Diagnosis not present

## 2024-01-06 DIAGNOSIS — Z86711 Personal history of pulmonary embolism: Secondary | ICD-10-CM | POA: Diagnosis not present

## 2024-01-06 DIAGNOSIS — I10 Essential (primary) hypertension: Secondary | ICD-10-CM | POA: Diagnosis not present

## 2024-01-06 DIAGNOSIS — G8929 Other chronic pain: Secondary | ICD-10-CM | POA: Diagnosis not present

## 2024-01-06 DIAGNOSIS — R000 Tachycardia, unspecified: Secondary | ICD-10-CM | POA: Diagnosis not present

## 2024-01-06 DIAGNOSIS — Z86718 Personal history of other venous thrombosis and embolism: Secondary | ICD-10-CM | POA: Diagnosis not present

## 2024-01-06 DIAGNOSIS — F17210 Nicotine dependence, cigarettes, uncomplicated: Secondary | ICD-10-CM | POA: Diagnosis not present

## 2024-01-06 DIAGNOSIS — M170 Bilateral primary osteoarthritis of knee: Secondary | ICD-10-CM | POA: Diagnosis not present

## 2024-01-06 DIAGNOSIS — Z556 Problems related to health literacy: Secondary | ICD-10-CM | POA: Diagnosis not present

## 2024-01-11 ENCOUNTER — Emergency Department (HOSPITAL_COMMUNITY)
Admission: EM | Admit: 2024-01-11 | Discharge: 2024-01-12 | Disposition: A | Discharge status: Other Acute Inpatient Hospital | Attending: Emergency Medicine | Admitting: Emergency Medicine

## 2024-01-11 ENCOUNTER — Emergency Department (HOSPITAL_COMMUNITY)

## 2024-01-11 ENCOUNTER — Other Ambulatory Visit: Payer: Self-pay

## 2024-01-11 ENCOUNTER — Encounter (HOSPITAL_COMMUNITY): Payer: Self-pay

## 2024-01-11 DIAGNOSIS — M79604 Pain in right leg: Secondary | ICD-10-CM | POA: Diagnosis not present

## 2024-01-11 DIAGNOSIS — S80211A Abrasion, right knee, initial encounter: Secondary | ICD-10-CM | POA: Diagnosis not present

## 2024-01-11 DIAGNOSIS — E119 Type 2 diabetes mellitus without complications: Secondary | ICD-10-CM | POA: Diagnosis not present

## 2024-01-11 DIAGNOSIS — Z5982 Transportation insecurity: Secondary | ICD-10-CM | POA: Diagnosis not present

## 2024-01-11 DIAGNOSIS — R000 Tachycardia, unspecified: Secondary | ICD-10-CM | POA: Diagnosis not present

## 2024-01-11 DIAGNOSIS — F1020 Alcohol dependence, uncomplicated: Secondary | ICD-10-CM | POA: Diagnosis not present

## 2024-01-11 DIAGNOSIS — F332 Major depressive disorder, recurrent severe without psychotic features: Secondary | ICD-10-CM | POA: Diagnosis not present

## 2024-01-11 DIAGNOSIS — M25561 Pain in right knee: Secondary | ICD-10-CM | POA: Diagnosis not present

## 2024-01-11 DIAGNOSIS — Z79899 Other long term (current) drug therapy: Secondary | ICD-10-CM | POA: Diagnosis not present

## 2024-01-11 DIAGNOSIS — I10 Essential (primary) hypertension: Secondary | ICD-10-CM | POA: Diagnosis not present

## 2024-01-11 DIAGNOSIS — F32A Depression, unspecified: Secondary | ICD-10-CM | POA: Diagnosis not present

## 2024-01-11 DIAGNOSIS — M25462 Effusion, left knee: Secondary | ICD-10-CM | POA: Diagnosis not present

## 2024-01-11 DIAGNOSIS — E7800 Pure hypercholesterolemia, unspecified: Secondary | ICD-10-CM | POA: Diagnosis not present

## 2024-01-11 DIAGNOSIS — Z556 Problems related to health literacy: Secondary | ICD-10-CM | POA: Diagnosis not present

## 2024-01-11 DIAGNOSIS — S80919A Unspecified superficial injury of unspecified knee, initial encounter: Secondary | ICD-10-CM | POA: Diagnosis not present

## 2024-01-11 DIAGNOSIS — G8929 Other chronic pain: Secondary | ICD-10-CM | POA: Diagnosis not present

## 2024-01-11 DIAGNOSIS — F1010 Alcohol abuse, uncomplicated: Secondary | ICD-10-CM | POA: Insufficient documentation

## 2024-01-11 DIAGNOSIS — W19XXXA Unspecified fall, initial encounter: Secondary | ICD-10-CM | POA: Insufficient documentation

## 2024-01-11 DIAGNOSIS — S0990XA Unspecified injury of head, initial encounter: Secondary | ICD-10-CM | POA: Diagnosis not present

## 2024-01-11 DIAGNOSIS — M170 Bilateral primary osteoarthritis of knee: Secondary | ICD-10-CM | POA: Diagnosis not present

## 2024-01-11 DIAGNOSIS — Z86711 Personal history of pulmonary embolism: Secondary | ICD-10-CM | POA: Diagnosis not present

## 2024-01-11 DIAGNOSIS — Z9181 History of falling: Secondary | ICD-10-CM | POA: Diagnosis not present

## 2024-01-11 DIAGNOSIS — M25461 Effusion, right knee: Secondary | ICD-10-CM | POA: Diagnosis not present

## 2024-01-11 DIAGNOSIS — F17210 Nicotine dependence, cigarettes, uncomplicated: Secondary | ICD-10-CM | POA: Diagnosis not present

## 2024-01-11 DIAGNOSIS — G4700 Insomnia, unspecified: Secondary | ICD-10-CM | POA: Diagnosis not present

## 2024-01-11 DIAGNOSIS — Z86718 Personal history of other venous thrombosis and embolism: Secondary | ICD-10-CM | POA: Diagnosis not present

## 2024-01-11 DIAGNOSIS — H538 Other visual disturbances: Secondary | ICD-10-CM | POA: Diagnosis not present

## 2024-01-11 DIAGNOSIS — Z604 Social exclusion and rejection: Secondary | ICD-10-CM | POA: Diagnosis not present

## 2024-01-11 DIAGNOSIS — S80212A Abrasion, left knee, initial encounter: Secondary | ICD-10-CM | POA: Diagnosis not present

## 2024-01-11 DIAGNOSIS — M1711 Unilateral primary osteoarthritis, right knee: Secondary | ICD-10-CM | POA: Diagnosis not present

## 2024-01-11 DIAGNOSIS — G4730 Sleep apnea, unspecified: Secondary | ICD-10-CM | POA: Diagnosis not present

## 2024-01-11 DIAGNOSIS — M1712 Unilateral primary osteoarthritis, left knee: Secondary | ICD-10-CM | POA: Diagnosis not present

## 2024-01-11 DIAGNOSIS — M25562 Pain in left knee: Secondary | ICD-10-CM | POA: Diagnosis not present

## 2024-01-11 DIAGNOSIS — R45851 Suicidal ideations: Secondary | ICD-10-CM

## 2024-01-11 DIAGNOSIS — S8991XA Unspecified injury of right lower leg, initial encounter: Secondary | ICD-10-CM | POA: Diagnosis present

## 2024-01-11 LAB — COMPREHENSIVE METABOLIC PANEL WITH GFR
ALT: 14 U/L (ref 0–44)
AST: 21 U/L (ref 15–41)
Albumin: 3.6 g/dL (ref 3.5–5.0)
Alkaline Phosphatase: 58 U/L (ref 38–126)
Anion gap: 9 (ref 5–15)
BUN: 13 mg/dL (ref 8–23)
CO2: 22 mmol/L (ref 22–32)
Calcium: 9.7 mg/dL (ref 8.9–10.3)
Chloride: 101 mmol/L (ref 98–111)
Creatinine, Ser: 1.23 mg/dL (ref 0.61–1.24)
GFR, Estimated: >60 mL/min (ref >60)
Glucose, Bld: 106 mg/dL — ABNORMAL HIGH (ref 70–99)
Potassium: 5.2 mmol/L — ABNORMAL HIGH (ref 3.5–5.1)
Sodium: 132 mmol/L — ABNORMAL LOW (ref 135–145)
Total Bilirubin: 0.6 mg/dL (ref 0.0–1.2)
Total Protein: 7.7 g/dL (ref 6.5–8.1)

## 2024-01-11 LAB — CBG MONITORING, ED
Glucose-Capillary: 138 mg/dL — ABNORMAL HIGH (ref 70–99)
Glucose-Capillary: 109 mg/dL — ABNORMAL HIGH (ref 70–99)

## 2024-01-11 LAB — CBC WITH DIFFERENTIAL/PLATELET
Abs Immature Granulocytes: 0.01 10*3/uL (ref 0.00–0.07)
Basophils Absolute: 0 10*3/uL (ref 0.0–0.1)
Basophils Relative: 1 %
Eosinophils Absolute: 0.1 10*3/uL (ref 0.0–0.5)
Eosinophils Relative: 3 %
HCT: 48.4 % (ref 39.0–52.0)
Hemoglobin: 15.8 g/dL (ref 13.0–17.0)
Immature Granulocytes: 0 %
Lymphocytes Relative: 40 %
Lymphs Abs: 1.6 10*3/uL (ref 0.7–4.0)
MCH: 32.6 pg (ref 26.0–34.0)
MCHC: 32.6 g/dL (ref 30.0–36.0)
MCV: 99.8 fL (ref 80.0–100.0)
Monocytes Absolute: 0.4 10*3/uL (ref 0.1–1.0)
Monocytes Relative: 11 %
Neutro Abs: 1.8 10*3/uL (ref 1.7–7.7)
Neutrophils Relative %: 45 %
Platelets: 207 10*3/uL (ref 150–400)
RBC: 4.85 MIL/uL (ref 4.22–5.81)
RDW: 13.8 % (ref 11.5–15.5)
WBC: 4 10*3/uL (ref 4.0–10.5)
nRBC: 0 % (ref 0.0–0.2)

## 2024-01-11 LAB — RAPID URINE DRUG SCREEN, HOSP PERFORMED
Amphetamines: NOT DETECTED
Barbiturates: NOT DETECTED
Benzodiazepines: NOT DETECTED
Cocaine: NOT DETECTED
Opiates: NOT DETECTED
Tetrahydrocannabinol: NOT DETECTED

## 2024-01-11 LAB — ETHANOL: Alcohol, Ethyl (B): <15 mg/dL (ref <15)

## 2024-01-11 MED ORDER — OXYCODONE HCL ER 15 MG PO T12A
15.0000 mg | EXTENDED_RELEASE_TABLET | Freq: Two times a day (BID) | ORAL | Status: DC
  Administered 2024-01-11 – 2024-01-12 (×3): 15 mg via ORAL
  Filled 2024-01-11 (×3): qty 1

## 2024-01-11 MED ORDER — OXYCODONE-ACETAMINOPHEN 5-325 MG PO TABS
2.0000 | ORAL_TABLET | Freq: Once | ORAL | Status: AC
  Administered 2024-01-11: 2 via ORAL
  Filled 2024-01-11: qty 2

## 2024-01-11 MED ORDER — IBUPROFEN 400 MG PO TABS
600.0000 mg | ORAL_TABLET | Freq: Four times a day (QID) | ORAL | Status: DC
  Administered 2024-01-11 – 2024-01-12 (×4): 600 mg via ORAL
  Filled 2024-01-11 (×4): qty 1

## 2024-01-11 MED ORDER — METHOCARBAMOL 500 MG PO TABS
500.0000 mg | ORAL_TABLET | Freq: Three times a day (TID) | ORAL | Status: DC | PRN
  Administered 2024-01-11 – 2024-01-12 (×2): 500 mg via ORAL
  Filled 2024-01-11 (×2): qty 1

## 2024-01-11 MED ORDER — DILTIAZEM HCL ER COATED BEADS 180 MG PO CP24
180.0000 mg | ORAL_CAPSULE | Freq: Every day | ORAL | Status: DC
  Administered 2024-01-12: 180 mg via ORAL
  Filled 2024-01-11 (×2): qty 1

## 2024-01-11 MED ORDER — TRAZODONE HCL 100 MG PO TABS
100.0000 mg | ORAL_TABLET | Freq: Every day | ORAL | Status: DC
  Administered 2024-01-11: 100 mg via ORAL
  Filled 2024-01-11: qty 1

## 2024-01-11 MED ORDER — DULOXETINE HCL 20 MG PO CPEP
40.0000 mg | ORAL_CAPSULE | Freq: Every day | ORAL | Status: DC
  Administered 2024-01-12: 40 mg via ORAL
  Filled 2024-01-11: qty 2

## 2024-01-11 MED ORDER — OXYCODONE HCL 5 MG PO TABS
5.0000 mg | ORAL_TABLET | ORAL | Status: DC | PRN
  Administered 2024-01-11 – 2024-01-12 (×3): 5 mg via ORAL
  Filled 2024-01-11 (×3): qty 1

## 2024-01-11 NOTE — ED Notes (Signed)
 Pt provided lunch bag and drinks. Pt sitting upright resting comfortably.

## 2024-01-11 NOTE — ED Provider Notes (Signed)
 [facility] AT [facility] Provider Note   CSN: [id number] Arrival date & time: 01/11/24  1004     History  Chief Complaint  Patient presents with   Fall   Knee Pain    Todd [name]. is a 68 y.o. male.   Patient complains of pain to both of his knees.  Patient reports that he has been falling frequently.  Patient reports that he has injured both knees and recent falls.  Patient has been getting injections and Todd [name].  He has been told that he would need knee surgery. Patient reports that he was made to come to the emergency department for help.  Patient reports that he has been drinking he has not been taking his psych medicines.  He reports thoughts of harming himself.  Patient states that he does not take care of himself and he drinks.  Patient reports he needs help getting straightened out.   Fall  Knee Pain      Home Medications Prior to Admission medications   Medication Sig Start Date End Date Taking? Authorizing Provider  allopurinol (ZYLOPRIM) 100 MG tablet Take 200 mg by mouth daily. Patient not taking: Reported on 10/11/2023 07/06/23   [provider]  diltiazem  (CARDIZEM  CD) 180 MG 24 hr capsule Take 1 capsule (180 mg total) by mouth daily. 10/28/23   Samtani, Jai-Gurmukh, MD  DULoxetine  (CYMBALTA ) 20 MG capsule Take 2 capsules (40 mg total) by mouth daily. 10/28/23   Samtani, Jai-Gurmukh, MD  hydrOXYzine  (ATARAX ) 25 MG tablet Take 1 tablet (25 mg total) by mouth 3 (three) times daily as needed for anxiety. 10/28/23   Samtani, Jai-Gurmukh, MD  oxyCODONE -acetaminophen  (PERCOCET/ROXICET) 5-325 MG tablet Take 1 tablet by mouth every 6 (six) hours as needed for moderate pain (pain score 4-6). 10/28/23   Samtani, Jai-Gurmukh, MD  trazodone  (DESYREL ) 300 MG tablet Take 300 mg by mouth at bedtime.    [provider]  Vitamin D, Ergocalciferol, (DRISDOL) 1.25 MG (50000 UNIT) CAPS capsule Take 50,000 Units by mouth every  7 (seven) days. Patient not taking: Reported on 08/08/2023 07/06/23   [provider]      Allergies    Patient has no known allergies.    Review of Systems   Review of Systems  All other systems reviewed and are negative.   Physical Exam Updated Vital Signs BP 133/85 (BP Location: Right Arm)   Pulse 78   Temp 97.6 F (36.4 C) (Oral)   Resp 20   Ht 5' 8 (1.727 m)   Wt 75.8 kg   SpO2 100%   BMI 25.39 kg/m  Physical Exam Vitals reviewed.  Constitutional:      Appearance: Normal appearance.  HENT:     Mouth/Throat:     Mouth: Mucous membranes are moist.  Eyes:     Pupils: Pupils are equal, round, and reactive to light.  Cardiovascular:     Rate and Rhythm: Normal rate.  Pulmonary:     Effort: Pulmonary effort is normal.  Abdominal:     General: Abdomen is flat.  Musculoskeletal:     Comments: Abrasions bilat knees.  Dried, healing   Skin:    General: Skin is warm.  Neurological:     General: No focal deficit present.     Mental Status: He is alert.  Psychiatric:        Mood and Affect: Mood normal.     ED Results / Procedures / Treatments   Labs (  all labs ordered are listed, but only abnormal results are displayed) Labs Reviewed  COMPREHENSIVE METABOLIC PANEL WITH GFR  ETHANOL  RAPID URINE DRUG SCREEN, HOSP PERFORMED  CBC WITH DIFFERENTIAL/PLATELET    EKG None  Radiology DG Knee Complete 4 Views Left Result Date: 01/11/2024 CLINICAL DATA:  Injury. Chronic knee pain with multiple recent falls. EXAM: LEFT KNEE - COMPLETE 4+ VIEW COMPARISON:  10/08/2023 FINDINGS: No acute fracture or dislocation. Lateral tibiofemoral joint space narrowing with subchondral cystic change. Stable tricompartmental peripheral spurring. Unchanged quadriceps tendon enthesophyte. Diminished joint effusion from prior exam. IMPRESSION: 1. No acute fracture or dislocation. 2. Tricompartmental osteoarthritis. Diminished joint effusion from prior exam. Electronically Signed    By: Todd [name] M.D.   On: 01/11/2024 12:19   DG Knee Complete 4 Views Right Result Date: 01/11/2024 CLINICAL DATA:  Injury. Patient reports chronic knee pain, multiple recent falls. EXAM: RIGHT KNEE - COMPLETE 4+ VIEW COMPARISON:  Most recent radiograph 10/08/2023 FINDINGS: No acute fracture or dislocation. Chronic patellar [place]. Chronic calcifications in the region of the quadriceps tendon. Mild osteoarthritis and chondrocalcinosis. Minimal knee joint effusion. IMPRESSION: 1. No acute fracture or dislocation. 2. Chronic patellar [place]. Chronic calcifications in the region of the quadriceps tendon. 3. Mild osteoarthritis and chondrocalcinosis. Electronically Signed   By: Todd [name] M.D.   On: 01/11/2024 12:18    Procedures Procedures    Medications Ordered in ED Medications  oxyCODONE -acetaminophen  (PERCOCET/ROXICET) 5-325 MG per tablet 2 tablet (2 tablets Oral Given 01/11/24 1046)    ED Course/ Medical Decision Making/ A&P                                 Medical Decision Making Patient complains of pain in both knees.  He reports he is seeing Gilberto Labella and getting injections.  Patient tells me that he has an appointment tomorrow for an injection.  On reevaluation patient reports that he canceled his appointment tomorrow because he feels like he needs psychiatric treatment.  Patient also reports he is having difficulty at home due to limited ambulation.  Patient's family is concerned and wants him to get inpatient psychiatric treatment.  Amount and/or Complexity of Data Reviewed Labs: ordered. Decision-making details documented in ED Course.    Details: Labs ordered reviewed and interpreted. Radiology: ordered and independent interpretation performed. Decision-making details documented in ED Course.    Details: Xray bilat knees degenerative changes.  ECG/medicine tests: ordered and independent interpretation performed. Decision-making details documented in ED Course.     Details: EKG ordered and reviewed Discussion of management or test interpretation with external provider(s): TTS consult   Risk Prescription drug management. Risk Details: Patient is requesting psychiatric evaluation.  Patient reports his family members feel like he needs to be hospitalized. Pt has a rambling history about inability to care for himself and being off his medications.  Pt has a history of substance abuse and alcohol abuse. He does not seem to be in withdrawal.   I will consult TTS.   Pt given percocet for pain.  He reports no relief.  Pt is on Xtampza  9mg .  I will given oxyconin 15 mg for pain.             Final Clinical Impression(s) / ED Diagnoses Final diagnoses:  Chronic pain of both knees  Depression, unspecified depression type  Alcohol abuse  Suicidal ideation    Rx / DC Orders ED Discharge Orders  None         Sandi Crosby, [place] 01/11/24 1407    Long, Joshua G, MD 01/26/24 0005

## 2024-01-11 NOTE — ED Notes (Addendum)
 EMS REPORT   BIB [facility] from home   Fall 3x today.  Uses walker at baseline.   Bilateral knee pain.  Aox4, GCS 15   Able to stand with assistance.   130/84   Hr 100  RR 18  96% RA   BGL 110  Hx  Diabetes  Hep C  HTN

## 2024-01-11 NOTE — ED Notes (Signed)
 Pts belonging documented and put in locker or given to security.  Items in locker include: .Brown jacket  . Glasses in jacket . Black graphic T shirt .[employer] . Keys in brown shorts .Red lighter in brown shorts (okayed by security) . 2 Black socks . 2 black shoes . 1 black walker that could not be placed in locker  Items given to security include: . 1 brown wallet .4 quarters in brown wallet . Numerous IDs in wallet .Numerous payment cards in wallet

## 2024-01-11 NOTE — ED Triage Notes (Signed)
 Pt came BIB [facility] from home d/t frequent falls lately, 3 being today. C/o bil knee pain, can stand with assistance, A/Ox4. 130/84, 100 bpm, 18 resp, 96% on RA, CBG 110 Hx of DM, 99.7.

## 2024-01-12 ENCOUNTER — Encounter (HOSPITAL_COMMUNITY): Payer: Self-pay | Admitting: Psychiatry

## 2024-01-12 DIAGNOSIS — F1020 Alcohol dependence, uncomplicated: Secondary | ICD-10-CM | POA: Diagnosis not present

## 2024-01-12 DIAGNOSIS — S80211A Abrasion, right knee, initial encounter: Secondary | ICD-10-CM | POA: Diagnosis not present

## 2024-01-12 DIAGNOSIS — F332 Major depressive disorder, recurrent severe without psychotic features: Secondary | ICD-10-CM | POA: Diagnosis not present

## 2024-01-12 LAB — CBG MONITORING, ED: Glucose-Capillary: 130 mg/dL — ABNORMAL HIGH (ref 70–99)

## 2024-01-12 MED ORDER — THIAMINE MONONITRATE 100 MG PO TABS: 100.0000 mg | ORAL_TABLET | Freq: Every day | ORAL | Status: DC

## 2024-01-12 MED ORDER — NICOTINE 21 MG/24HR TD PT24
21.0000 mg | MEDICATED_PATCH | Freq: Once | TRANSDERMAL | Status: DC
  Administered 2024-01-12: 21 mg via TRANSDERMAL
  Filled 2024-01-12: qty 1

## 2024-01-12 MED ORDER — DULOXETINE HCL 20 MG PO CPEP: 20.0000 mg | ORAL_CAPSULE | Freq: Two times a day (BID) | ORAL | Status: DC

## 2024-01-12 MED ORDER — THIAMINE HCL 100 MG/ML IJ SOLN
100.0000 mg | Freq: Once | INTRAMUSCULAR | Status: AC
  Administered 2024-01-12: 100 mg via INTRAMUSCULAR
  Filled 2024-01-12: qty 2

## 2024-01-12 MED ORDER — HYDROXYZINE HCL 25 MG PO TABS: 25.0000 mg | ORAL_TABLET | Freq: Four times a day (QID) | ORAL | Status: DC | PRN

## 2024-01-12 MED ORDER — ADULT MULTIVITAMIN W/MINERALS CH
1.0000 | ORAL_TABLET | Freq: Every day | ORAL | Status: DC
  Administered 2024-01-12: 1 via ORAL
  Filled 2024-01-12: qty 1

## 2024-01-12 MED ORDER — LOPERAMIDE HCL 2 MG PO CAPS: 2.0000 mg | ORAL_CAPSULE | ORAL | Status: DC | PRN

## 2024-01-12 MED ORDER — LORAZEPAM 1 MG PO TABS: 1.0000 mg | ORAL_TABLET | Freq: Four times a day (QID) | ORAL | Status: DC | PRN

## 2024-01-12 MED ORDER — ONDANSETRON 4 MG PO TBDP: 4.0000 mg | ORAL_TABLET | Freq: Four times a day (QID) | ORAL | Status: DC | PRN

## 2024-01-12 NOTE — ED Provider Notes (Signed)
 Emergency Medicine Observation Re-evaluation Note  Todd [name]. is a 68 y.o. male, seen on rounds today.  Pt initially presented to the ED for complaints of Fall and Knee Pain Currently, the patient is resting, denies complaints .  Physical Exam  BP 133/79 (BP Location: Left Arm)   Pulse 99   Temp 98.9 F (37.2 C) (Oral)   Resp 20   Ht 5\' 8"  (1.727 m)   Wt 75.8 kg   SpO2 96%   BMI 25.39 kg/m  Physical Exam General: NAD Cardiac: regular rate Lungs: equal chest rise Psych: calm  ED Course / MDM  EKG:EKG Interpretation Date/Time:  Wednesday Jan 11 2024 13:16:08 EDT Ventricular Rate:  73 PR Interval:  166 QRS Duration:  131 QT Interval:  431 QTC Calculation: 475 R Axis:   -66  Text Interpretation: Sinus rhythm Atrial premature complex RBBB and LAFB Similar to July 2023 Confirmed by Abby Hocking [id number]) on 01/11/2024 1:22:50 PM  I have reviewed the labs performed to date as well as medications administered while in observation.  Recent changes in the last 24 hours include arrived in ER.  Plan  Current plan is for pending TTS consult. This was placed yesterday but has not been performed. Will follow up with nursing.    Mordecai Applebaum, MD 01/12/24 [phone]

## 2024-01-12 NOTE — Progress Notes (Signed)
 Pt has been accepted to [facility] on 01/12/2024. Bed assignment:[facility]  Pt meets inpatient criteria per Heyward Loud, NP  Attending Physician will be Velda Gerlach, MD   Report can be called to: [phone]  Pt can arrive after ASAP  Care Team Notified: Heyward Loud, NP, Onetha Bile, RN

## 2024-01-12 NOTE — Progress Notes (Signed)
 Inpatient Psychiatric Referral  Patient was recommended inpatient per Heyward Loud, NP). There are no available beds at [facility], per [facility] Bevin Bucks, RN). Patient was referred to the following out of network facilities:  Destination  Service Provider Address Phone Fax  [facility] [address] [place] [address] [phone]  [facility] [address] [place], [place] [address] [phone]  [facility] [address] [place] [address] [phone]  [facility] [address], [place] [address] [phone]  [facility] [address] [place] [address] [phone]  [facility] [address] [place] [address] [phone]  [facility] EFAX [address] [place], [place] [phone]  [facility] [address] [place], [place] [address]      Situation ongoing, CSW to continue following and update chart as more information becomes available.    Albertus Alt MSW, LCSWA 01/12/2024  01:04PM

## 2024-01-12 NOTE — Consult Note (Addendum)
 [facility] Psychiatric Consult Initial  Patient Name: .Todd [name].  MRN: [id number]  DOB: 03-14-1956  Consult Order details:  Orders (From admission, onward)     Start     Ordered   01/12/24 0826  CONSULT TO CALL ACT TEAM       Ordering Provider: Delphine Fiedler, PA-C  Provider:  (Not yet assigned)  Question:  Reason for Consult?  Answer:  Suicidal ideation   01/12/24 0826             Mode of Visit: In person    Psychiatry Consult Evaluation  Service Date: Jan 12, 2024 LOS:  LOS: 0 days  Chief Complaint: "I've just been struggling and I hit that point of I just don't give a fuck"  Primary Psychiatric Diagnoses  MDD (major depressive disorder), recurrent severe, without psychosis (HCC) 2.  Alcohol use disorder, severe, dependence (HCC)  Assessment   Todd [name]. is a 68 y.o. AA male with a past psychiatric history of polysubstance abuse (I.e., EtOH, cocaine, opiates), unipolar major depression, bipolar depression, alcohol induced mood disorder, alcohol use disorder, and substance-induced mood disorder, with pertinent medical comorbidities/history that include SVT which required hospitalization (10/10/2023), bilateral chronic knee pain, hypertension, congestive heart failure, diverticulosis of colon, and AKI, who presented this encounter by way of EMS, due to concerns from family/social worker of the patient having increased falls, experiencing progressive and worsening depressive mood, troubles with eating, insomnia, and thoughts of suicide, in the context of progressing difficulties with caring for self, relapse into heavy EtOH use, and medication noncompliance with psychiatric medications for mental health. Patient is currently voluntary at this time, as well as medically clear, per EDP team.  Upon evaluation, patient presents with symptomology that is most consistent with a recurrent episode that is severe without psychosis of unipolar major depression and alcohol  use disorder severe dependence.    Evidence of this is appreciable from evaluation conducted, where the patient endorses that he has been drinking heavily for the last 3 months and has a long history of EtOH abuse and dependence, as well as endorsements of experiencing progressive worsening of his mental state, due to difficulties with caring for self, EtOH use, and not taking medications for mental health.  Given the patient's endorsements of desires for mental health and substance abuse treatment, and endorsements of passive suicidal ideations, recommendation is for inpatient mental health hospitalization, for safety and stabilization of the patient.   Discussed with patient restarting previous medications that have been efficacious in promoting stability for the patient of Cymbalta  and trazodone  at this time, as well as implementing CIWA protocols for safety, given the patient's heavy EtOH use over the last 3 months, to which patient endorsed he was amenable to this.  Diagnoses:  Active Hospital problems: Active Problems:   MDD (major depressive disorder), recurrent severe, without psychosis (HCC)   Alcohol use disorder, severe, dependence (HCC)    Plan   #MDD (major depressive disorder), recurrent severe, without psychosis (HCC) #Alcohol use disorder, severe, dependence (HCC)  ## Psychiatric Recommendations:   - Recommend Cymbalta  20 mg p.o. twice daily - Recommend trazodone  100 mg p.o. nightly - Recommend CIWA protocols for EtOH withdrawals/monitoring  ## Medical Decision Making Capacity: Not specifically addressed in this encounter  ## Further Work-up: Recommend repeat EKG for QTc monitoring, appreciably presents with 01/12/2024 EKG showing prolonged QTc of 475  ## Disposition:-- We recommend inpatient psychiatric hospitalization when medically cleared. Patient is under voluntary admission status at this  time; please IVC if attempts to leave hospital.  ## Behavioral /  Environmental: -Recommend routine safety/agitation/delirium precautions    ## Safety and Observation Level:  - Based on my clinical evaluation, I estimate the patient to be at low risk of self harm in the current setting. - At this time, we recommend  routine. This decision is based on my review of the chart including patient's history and current presentation, interview of the patient, mental status examination, and consideration of suicide risk including evaluating suicidal ideation, plan, intent, suicidal or self-harm behaviors, risk factors, and protective factors. This judgment is based on our ability to directly address suicide risk, implement suicide prevention strategies, and develop a safety plan while the patient is in the clinical setting. Please contact our team if there is a concern that risk level has changed.  CSSR Risk Category:C-SSRS RISK CATEGORY: Low Risk  Suicide Risk Assessment: Patient has following modifiable risk factors for suicide: untreated depression, social isolation, medication noncompliance, and lack of access to outpatient mental health resources, which we are addressing by treatment recommendations. Patient has following non-modifiable or demographic risk factors for suicide: male gender, history of suicide attempt, and psychiatric hospitalization Patient has the following protective factors against suicide: Supportive family, Frustration tolerance, and no history of NSSIB  Thank you for this consult request. Recommendations have been communicated to the primary team.  We will continue to follow at this time.   Volanda Gruber, NP     History of Present Illness   Todd [name]. is a 68 y.o. AA male with a past psychiatric history of polysubstance abuse (I.e., EtOH, cocaine, opiates), unipolar major depression, bipolar depression, alcohol induced mood disorder, alcohol use disorder, and substance-induced mood disorder, with pertinent medical  comorbidities/history that include SVT which required hospitalization (10/10/2023), bilateral chronic knee pain, hypertension, congestive heart failure, diverticulosis of colon, and AKI, who presented this encounter by way of EMS, due to concerns from family/social worker of the patient having increased falls, experiencing progressive and worsening depressive mood, troubles with eating, insomnia, and thoughts of suicide, in the context of progressing difficulties with caring for self, relapse into heavy EtOH use, and medication noncompliance with psychiatric medications for mental health. Patient is currently voluntary at this time, as well as medically clear, per EDP team.  Patient seen today at the [facility] emergency department for face-to-face psychiatric evaluation.  Upon evaluation, patient endorses that he was brought in this encounter at the strong encouragement of his social worker and niece by way of EMS, due to their concerns of him experiencing over the last 3 months increased falls, experiencing progressive and worsening depressive mood, troubles with eating, insomnia, and thoughts of suicide, in the context of progressing difficulties with caring for self, relapse into heavy EtOH use, and medication noncompliance with psychiatric medications for keeping his mental health stable.  Expanding on difficulties with caring for self, patient endorses that he has difficulties remembering to take his medications, he has difficulties with attending to household chores, requires his rollator to move about, has difficulties cooking so he only using the microwave, has to use a shower chair to bathe, can no longer drive because of bilateral chronic knee pain that is more often lately not controlled, and requires assistance from his niece and social worker to provide/bring him food and other necessities, which he states all over the last 3 months have been progressively becoming more stressful, and have led to  relapse on EtOH heavily.  Patient  endorses that difficulties with caring for self have been increased over the last 3 months, because while he has been "blessed" with housing provided by his niece in the form of his own apartment, states that his niece as a single mother is too busy to help him often, and he does not have a caregiver that starts for another several months; as well as endorses and highlights that his bilateral chronic knee pain has been worse than it has been historically over the last several months.   Expanding on relapse into heavy EtOH use, patient expands on this and endorses that over the last 3 months "thereabout", he has been drinking about two forty ounce malt liquors per day, with last use being earlier in the day prior to coming in this encounter; UDS appreciably negative, BAL unremarkable.  Expanding on patient's EtOH use history, patient endorses that since he was 68 years old he has struggled with alcohol use disorder, and has been formally diagnosed in the past, which is consistent with chart review.  Patient endorses that he does not have a history of delirium tremens, complications medically from EtOH use, alcohol withdrawal seizures, and/or withdrawals from EtOH that have been considered severe, states, "I mostly just get really bad hangovers".   Upon exam, patient endorses no withdrawal symptomology, and upon physical exam, presents with no appreciable EtOH withdrawal symptomology.  Patient endorses no formal EtOH abuse treatment, such as residential and/or a detox facility, but endorses multiple past admissions for inpatient mental health, which he states were also encounters to address his history of substance abuse.  Patient endorses last inpatient mental health hospitalization was not too long ago, which is consistent with chart review.  Patient endorses no history of outpatient medication management and/or therapy utilization, but then pauses and states, "well many years  ago I used to see briefly a Dr. Sid Dragon" (medication management).    Expanding on patient's substance abuse history, patient endorses vaguely that he has a history, "here and there" of using cocaine, but states that none in recent time, states that, "if I had to guess, probably been about a year", and also expands some and states, "probably been off and on for years".  Patient expands further and endorses that he has a history of taking opiates for pain, but denies history of opiate abuse, which is incongruent to chart review, as chart review reveals significant opiate use history in the past.  Patient endorses about a half a pack of cigarettes per day, states that he has been smoking since "all my life".  Discussing patient's endorsements of medication noncompliance, patient endorses that he has been off of his mental health medications for several months, due to refusal he states to participate with outpatient mental health services, and notably states, "no, when it gets bad, I come here", as well as attributes noncompliance to he states not being able to run errands and relying on his social worker and niece to provide them for him, which he states that they have not been.   Patient endorses formally when asked that he remains currently with passive suicidal ideations, states that he does not have any current plan or intent, then expresses that he is goal-directed however towards getting help.  Patient endorses vague history of suicide attempts, states that he has attempted multiple times in the past, but cannot remember the last time he has attempted, just states, "I've mostly tried overdosing in the past".  Patient endorses no history of self-injurious behavior.  Patient endorses no homicidal ideations.  Patient endorses no auditory and or visual hallucinations, and objectively, does not appear to be presenting with psychotic features, and orientation is intact, without concerns or fluctuations of  consciousness.  Patient evaluated for bipolar symptomology, current and/or historically, and test negative formally.  Discussed with patient that given his endorsements of wanting to address his mental health and substance abuse, recommendation would be for inpatient mental health hospitalization, as well as restarting previous medications/ciwa protocols that have been helpful for him, to which patient endorsed he was amenable to this.  Review of Systems  Constitutional:  Positive for malaise/fatigue and weight loss.  Gastrointestinal:  Negative for abdominal pain, constipation, diarrhea, heartburn, nausea and vomiting.  Musculoskeletal:  Positive for falls (Increase last couple weeks), joint pain and myalgias.  Neurological:  Positive for weakness. Negative for dizziness, tremors, seizures, loss of consciousness and headaches.  Psychiatric/Behavioral:  Positive for depression, substance abuse (ETOH daily) and suicidal ideas (Passive). Negative for hallucinations. The patient is nervous/anxious and has insomnia.   All other systems reviewed and are negative.   Psychiatric and Social History  Psychiatric History:  Information collected from: Chart review/patient  Prev Dx/Sx: polysubstance abuse (I.e., EtOH, cocaine, opiates), unipolar major depression, bipolar depression, alcohol induced mood disorder, alcohol use disorder, and substance-induced mood disorder Current Psych Provider: None Home Meds (current): None Previous Med Trials: Cymbalta , trazodone , Depakote , Seroquel Therapy: None  Prior Psych Hospitalization: Multiple Prior Self Harm: Yes, reports history of multiple suicide attempts Prior Violence: None reported  Family Psych History: None reported Family Hx suicide: None reported  Social History:  Developmental Hx: None reported Educational Hx: None reported 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[employer] Hx: Multiple involuntary commitments Living Situation: Lives in an  apartment alone Spiritual Hx: None reported  Access to weapons/lethal means: None endorsed  Substance History Alcohol: ETOH use disorder history   Type of alcohol: About 2-40 ounce malt liquors Last Drink: Prior to coming in Number of drinks per day: About 2-40 ounce malt liquors History of alcohol withdrawal seizures : None reported History of DT's: None reported Tobacco: Daily, smokes about half pack a day, for "years" Illicit drugs: History of cocaine and opiate abuse Prescription drug abuse: None reported Rehab hx: None reported, outside of being treated for substance abuse during inpatient mental health hospitalizations multiple times  Exam Findings  Physical Exam: As above Vital Signs:  Temp:  [98 F (36.7 C)-98.9 F (37.2 C)] 98.9 F (37.2 C) (05/29 0635) Pulse Rate:  [74-99] 93 (05/29 1003) Resp:  [18-24] 18 (05/29 1003) BP: (111-137)/(71-95) 131/95 (05/29 1003) SpO2:  [93 %-98 %] 93 % (05/29 1003) Blood pressure (!) 131/95, pulse 93, temperature 98.9 F (37.2 C), temperature source Oral, resp. rate 18, height 5\' 8"  (1.727 m), weight 75.8 kg, SpO2 93%. Body mass index is 25.39 kg/m.  Physical Exam Vitals and nursing note reviewed.  Constitutional:      General: He is not in acute distress.    Appearance: He is not ill-appearing, toxic-appearing or diaphoretic.  Pulmonary:     Effort: Pulmonary effort is normal.  Skin:    General: Skin is warm and dry.  Neurological:     Mental Status: He is alert and oriented to person, place, and time.     Motor: Weakness present. No tremor or seizure activity.  Psychiatric:        Attention and Perception: Attention and perception normal. He does not perceive auditory or visual hallucinations.  Mood and Affect: Affect normal. Mood is depressed.        Speech: Speech normal.        Behavior: Behavior is not agitated, slowed, aggressive, withdrawn, hyperactive or combative. Behavior is cooperative.        Thought  Content: Thought content is not paranoid or delusional. Thought content includes suicidal (Passive thoughts) ideation. Thought content does not include homicidal ideation.        Cognition and Memory: Cognition normal. Impaired cognition: Subjectively reports he has difficulties remembering to take his medications. Memory is impaired (Subjectively reports he has difficulties remembering to take his medications).        Judgment: Judgment normal.   Mental Status Exam: General Appearance: Casual  Orientation:  Full (Time, Place, and Person)  Memory:  Largely intact, though reports mild difficulties with remembering to take his medications  Concentration:  Concentration: Fair and Attention Span: Fair  Recall:  Fair  Attention  Fair  Eye Contact:  Fair  Speech:  Clear and Coherent and Normal Rate  Language:  Fair  Volume:  Normal  Mood: Depressed  Affect:  Appropriate  Thought Process:  Coherent, Goal Directed, and Linear  Thought Content:  Negative and Logical  Suicidal Thoughts:  Yes.  without intent/plan  Homicidal Thoughts:  No  Judgement:  Intact  Insight:  Present  Psychomotor Activity:  Normal  Akathisia:  No  Fund of Knowledge:  Fair      Assets:  Manufacturing systems engineer Desire for Improvement Financial Resources/Insurance Housing Leisure Time Resilience Social Support Talents/Skills Transportation Vocational/Educational  Cognition: WDL   ADL's:  Impaired  AIMS (if indicated):   0     Other History   These have been pulled in through the EMR, reviewed, and updated if appropriate.  Family History:  The patient's family history includes Cancer in his father.  Medical History: Past Medical History:  Diagnosis Date   Arthritis    "fingers" (07/14/2018)   Depression    DVT (deep venous thrombosis) (HCC) LLE   Hepatitis C     finished harvoni tx ~ 2017   Hypercholesterolemia    Hypertension    Prostate cancer (HCC) 24yrs ago   Pulmonary embolism and infarction  (HCC) 07/13/2018   Sleep apnea    not currently using cpap, mask causing vertigo   Type II diabetes mellitus (HCC)     Surgical History: Past Surgical History:  Procedure Laterality Date   BIOPSY  01/12/2019   Procedure: BIOPSY;  Surgeon: Lanita Pitman, MD;  Location: [facility] ENDOSCOPY;  Service: Endoscopy;;   BIOPSY  06/23/2019   Procedure: BIOPSY;  Surgeon: Evangeline Hilts, MD;  Location: [facility] ENDOSCOPY;  Service: Endoscopy;;   COLONOSCOPY WITH PROPOFOL  N/A 02/19/2014   Procedure: COLONOSCOPY WITH PROPOFOL ;  Surgeon: Garrett Kallman, MD;  Location: [facility] ENDOSCOPY;  Service: Endoscopy;  Laterality: N/A;   ENDOVENOUS ABLATION SAPHENOUS VEIN W/ LASER Left 11/22/2017   endovenous laser ablation L SSV by Merced Stair MD    ESOPHAGEAL BRUSHING  06/23/2019   Procedure: ESOPHAGEAL BRUSHING;  Surgeon: Evangeline Hilts, MD;  Location: [facility] ENDOSCOPY;  Service: Endoscopy;;   ESOPHAGOGASTRODUODENOSCOPY (EGD) WITH PROPOFOL  N/A 01/12/2019   Procedure: ESOPHAGOGASTRODUODENOSCOPY (EGD) WITH PROPOFOL ;  Surgeon: Lanita Pitman, MD;  Location: [facility] ENDOSCOPY;  Service: Endoscopy;  Laterality: N/A;  Patient is also scheduled for barium swallow; please notify radiology after patient's EGD is complete so that barium swallow follows the endoscopy, not vice versa   ESOPHAGOGASTRODUODENOSCOPY (EGD) WITH PROPOFOL  N/A 06/23/2019  Procedure: ESOPHAGOGASTRODUODENOSCOPY (EGD) WITH PROPOFOL ;  Surgeon: Evangeline Hilts, MD;  Location: [facility] ENDOSCOPY;  Service: Endoscopy;  Laterality: N/A;   PROSTATECTOMY  2008   REPAIR QUADRICEPS / HAMSTRING MUSCLE Right      Medications:   Current Facility-Administered Medications:    diltiazem  (CARDIZEM  CD) 24 hr capsule 180 mg, 180 mg, Oral, Daily, Iva Mariner, MD, 180 mg at 01/12/24 1029   DULoxetine  (CYMBALTA ) DR capsule 40 mg, 40 mg, Oral, Daily, Iva Mariner, MD, 40 mg at 01/12/24 1029   ibuprofen  (ADVIL ) tablet 600 mg, 600 mg, Oral, Q6H, Dixon, Verdie Gladden, MD, 600 mg at 01/12/24 1028    methocarbamol  (ROBAXIN ) tablet 500 mg, 500 mg, Oral, Q8H PRN, Iva Mariner, MD, 500 mg at 01/11/24 2312   oxyCODONE  (Oxy IR/ROXICODONE ) immediate release tablet 5 mg, 5 mg, Oral, Q3H PRN, Iva Mariner, MD, 5 mg at 01/12/24 4132   oxyCODONE  (OXYCONTIN ) 12 hr tablet 15 mg, 15 mg, Oral, Q12H, Sofia, Leslie K, PA-C, 15 mg at 01/12/24 1030   traZODone  (DESYREL ) tablet 100 mg, 100 mg, Oral, QHS, Dixon, Verdie Gladden, MD, 100 mg at 01/11/24 2313  Current Outpatient Medications:    allopurinol (ZYLOPRIM) 100 MG tablet, Take 200 mg by mouth daily. (Patient not taking: Reported on 10/11/2023), Disp: , Rfl:    diltiazem  (CARDIZEM  CD) 180 MG 24 hr capsule, Take 1 capsule (180 mg total) by mouth daily., Disp: 30 capsule, Rfl: 11   DULoxetine  (CYMBALTA ) 20 MG capsule, Take 2 capsules (40 mg total) by mouth daily., Disp: 60 capsule, Rfl: 3   hydrOXYzine  (ATARAX ) 25 MG tablet, Take 1 tablet (25 mg total) by mouth 3 (three) times daily as needed for anxiety., Disp: 90 tablet, Rfl: 1   oxyCODONE -acetaminophen  (PERCOCET/ROXICET) 5-325 MG tablet, Take 1 tablet by mouth every 6 (six) hours as needed for moderate pain (pain score 4-6)., Disp: 30 tablet, Rfl: 0   trazodone  (DESYREL ) 300 MG tablet, Take 300 mg by mouth at bedtime., Disp: , Rfl:    Vitamin D, Ergocalciferol, (DRISDOL) 1.25 MG (50000 UNIT) CAPS capsule, Take 50,000 Units by mouth every 7 (seven) days. (Patient not taking: Reported on 08/08/2023), Disp: , Rfl:   Allergies: No Known Allergies  Volanda Gruber, NP

## 2024-01-27 ENCOUNTER — Encounter: Payer: Self-pay | Admitting: Physical Medicine & Rehabilitation

## 2024-02-07 DIAGNOSIS — M1712 Unilateral primary osteoarthritis, left knee: Secondary | ICD-10-CM | POA: Diagnosis not present

## 2024-02-10 DIAGNOSIS — G8929 Other chronic pain: Secondary | ICD-10-CM | POA: Diagnosis not present

## 2024-02-10 DIAGNOSIS — R1319 Other dysphagia: Secondary | ICD-10-CM | POA: Diagnosis not present

## 2024-02-10 DIAGNOSIS — M170 Bilateral primary osteoarthritis of knee: Secondary | ICD-10-CM | POA: Diagnosis not present

## 2024-02-10 DIAGNOSIS — F17200 Nicotine dependence, unspecified, uncomplicated: Secondary | ICD-10-CM | POA: Diagnosis not present

## 2024-03-02 ENCOUNTER — Encounter: Attending: Physical Medicine & Rehabilitation | Admitting: Physical Medicine & Rehabilitation

## 2024-04-04 ENCOUNTER — Inpatient Hospital Stay (HOSPITAL_COMMUNITY)

## 2024-04-04 ENCOUNTER — Other Ambulatory Visit: Payer: Self-pay

## 2024-04-04 ENCOUNTER — Inpatient Hospital Stay (HOSPITAL_COMMUNITY)
Admission: EM | Admit: 2024-04-04 | Discharge: 2024-04-13 | DRG: 643 | Disposition: A | Discharge status: Skilled Nursing Facility | Attending: Internal Medicine | Admitting: Internal Medicine

## 2024-04-04 ENCOUNTER — Emergency Department (HOSPITAL_COMMUNITY)

## 2024-04-04 DIAGNOSIS — N179 Acute kidney failure, unspecified: Secondary | ICD-10-CM | POA: Diagnosis not present

## 2024-04-04 DIAGNOSIS — R45851 Suicidal ideations: Secondary | ICD-10-CM | POA: Diagnosis present

## 2024-04-04 DIAGNOSIS — G4700 Insomnia, unspecified: Secondary | ICD-10-CM | POA: Diagnosis present

## 2024-04-04 DIAGNOSIS — F3160 Bipolar disorder, current episode mixed, unspecified: Secondary | ICD-10-CM | POA: Diagnosis present

## 2024-04-04 DIAGNOSIS — J189 Pneumonia, unspecified organism: Secondary | ICD-10-CM | POA: Diagnosis present

## 2024-04-04 DIAGNOSIS — F17210 Nicotine dependence, cigarettes, uncomplicated: Secondary | ICD-10-CM | POA: Diagnosis present

## 2024-04-04 DIAGNOSIS — Z7984 Long term (current) use of oral hypoglycemic drugs: Secondary | ICD-10-CM

## 2024-04-04 DIAGNOSIS — K2210 Ulcer of esophagus without bleeding: Secondary | ICD-10-CM | POA: Diagnosis present

## 2024-04-04 DIAGNOSIS — K219 Gastro-esophageal reflux disease without esophagitis: Secondary | ICD-10-CM | POA: Diagnosis present

## 2024-04-04 DIAGNOSIS — Z8546 Personal history of malignant neoplasm of prostate: Secondary | ICD-10-CM

## 2024-04-04 DIAGNOSIS — F1010 Alcohol abuse, uncomplicated: Secondary | ICD-10-CM | POA: Diagnosis present

## 2024-04-04 DIAGNOSIS — E222 Syndrome of inappropriate secretion of antidiuretic hormone: Principal | ICD-10-CM | POA: Diagnosis present

## 2024-04-04 DIAGNOSIS — Z8673 Personal history of transient ischemic attack (TIA), and cerebral infarction without residual deficits: Secondary | ICD-10-CM

## 2024-04-04 DIAGNOSIS — I5032 Chronic diastolic (congestive) heart failure: Secondary | ICD-10-CM | POA: Diagnosis present

## 2024-04-04 DIAGNOSIS — W44F3XA Food entering into or through a natural orifice, initial encounter: Secondary | ICD-10-CM | POA: Diagnosis present

## 2024-04-04 DIAGNOSIS — E871 Hypo-osmolality and hyponatremia: Secondary | ICD-10-CM | POA: Diagnosis present

## 2024-04-04 DIAGNOSIS — Z86718 Personal history of other venous thrombosis and embolism: Secondary | ICD-10-CM

## 2024-04-04 DIAGNOSIS — Z79899 Other long term (current) drug therapy: Secondary | ICD-10-CM

## 2024-04-04 DIAGNOSIS — E119 Type 2 diabetes mellitus without complications: Secondary | ICD-10-CM | POA: Diagnosis present

## 2024-04-04 DIAGNOSIS — I110 Hypertensive heart disease with heart failure: Secondary | ICD-10-CM | POA: Diagnosis present

## 2024-04-04 DIAGNOSIS — Z5982 Transportation insecurity: Secondary | ICD-10-CM

## 2024-04-04 DIAGNOSIS — K2950 Unspecified chronic gastritis without bleeding: Secondary | ICD-10-CM | POA: Diagnosis present

## 2024-04-04 DIAGNOSIS — Z8042 Family history of malignant neoplasm of prostate: Secondary | ICD-10-CM

## 2024-04-04 DIAGNOSIS — Z9079 Acquired absence of other genital organ(s): Secondary | ICD-10-CM

## 2024-04-04 DIAGNOSIS — Z5941 Food insecurity: Secondary | ICD-10-CM

## 2024-04-04 DIAGNOSIS — K2920 Alcoholic gastritis without bleeding: Secondary | ICD-10-CM | POA: Diagnosis present

## 2024-04-04 DIAGNOSIS — F29 Unspecified psychosis not due to a substance or known physiological condition: Secondary | ICD-10-CM | POA: Diagnosis present

## 2024-04-04 DIAGNOSIS — I480 Paroxysmal atrial fibrillation: Secondary | ICD-10-CM | POA: Diagnosis present

## 2024-04-04 DIAGNOSIS — I4892 Unspecified atrial flutter: Secondary | ICD-10-CM | POA: Diagnosis present

## 2024-04-04 DIAGNOSIS — G8929 Other chronic pain: Secondary | ICD-10-CM | POA: Diagnosis present

## 2024-04-04 DIAGNOSIS — F1910 Other psychoactive substance abuse, uncomplicated: Secondary | ICD-10-CM | POA: Diagnosis present

## 2024-04-04 DIAGNOSIS — E861 Hypovolemia: Secondary | ICD-10-CM | POA: Diagnosis present

## 2024-04-04 DIAGNOSIS — Z8719 Personal history of other diseases of the digestive system: Secondary | ICD-10-CM

## 2024-04-04 DIAGNOSIS — M25562 Pain in left knee: Secondary | ICD-10-CM | POA: Diagnosis present

## 2024-04-04 DIAGNOSIS — F3130 Bipolar disorder, current episode depressed, mild or moderate severity, unspecified: Secondary | ICD-10-CM | POA: Diagnosis present

## 2024-04-04 DIAGNOSIS — F1411 Cocaine abuse, in remission: Secondary | ICD-10-CM | POA: Diagnosis present

## 2024-04-04 DIAGNOSIS — M47817 Spondylosis without myelopathy or radiculopathy, lumbosacral region: Secondary | ICD-10-CM | POA: Diagnosis present

## 2024-04-04 DIAGNOSIS — N50812 Left testicular pain: Secondary | ICD-10-CM | POA: Diagnosis present

## 2024-04-04 DIAGNOSIS — T438X5A Adverse effect of other psychotropic drugs, initial encounter: Secondary | ICD-10-CM | POA: Diagnosis present

## 2024-04-04 DIAGNOSIS — K449 Diaphragmatic hernia without obstruction or gangrene: Secondary | ICD-10-CM | POA: Diagnosis present

## 2024-04-04 DIAGNOSIS — R1312 Dysphagia, oropharyngeal phase: Secondary | ICD-10-CM | POA: Diagnosis present

## 2024-04-04 DIAGNOSIS — K222 Esophageal obstruction: Secondary | ICD-10-CM | POA: Diagnosis present

## 2024-04-04 DIAGNOSIS — T18128A Food in esophagus causing other injury, initial encounter: Secondary | ICD-10-CM | POA: Diagnosis present

## 2024-04-04 DIAGNOSIS — M25561 Pain in right knee: Secondary | ICD-10-CM | POA: Diagnosis present

## 2024-04-04 DIAGNOSIS — F319 Bipolar disorder, unspecified: Secondary | ICD-10-CM | POA: Diagnosis present

## 2024-04-04 DIAGNOSIS — M47816 Spondylosis without myelopathy or radiculopathy, lumbar region: Secondary | ICD-10-CM | POA: Diagnosis present

## 2024-04-04 DIAGNOSIS — M1711 Unilateral primary osteoarthritis, right knee: Secondary | ICD-10-CM | POA: Diagnosis present

## 2024-04-04 DIAGNOSIS — Z604 Social exclusion and rejection: Secondary | ICD-10-CM | POA: Diagnosis present

## 2024-04-04 DIAGNOSIS — Z86711 Personal history of pulmonary embolism: Secondary | ICD-10-CM

## 2024-04-04 DIAGNOSIS — F314 Bipolar disorder, current episode depressed, severe, without psychotic features: Secondary | ICD-10-CM | POA: Diagnosis not present

## 2024-04-04 DIAGNOSIS — K2970 Gastritis, unspecified, without bleeding: Secondary | ICD-10-CM | POA: Diagnosis not present

## 2024-04-04 DIAGNOSIS — K2900 Acute gastritis without bleeding: Secondary | ICD-10-CM | POA: Diagnosis present

## 2024-04-04 DIAGNOSIS — I10 Essential (primary) hypertension: Secondary | ICD-10-CM | POA: Diagnosis present

## 2024-04-04 DIAGNOSIS — E7800 Pure hypercholesterolemia, unspecified: Secondary | ICD-10-CM | POA: Diagnosis present

## 2024-04-04 DIAGNOSIS — N5082 Scrotal pain: Secondary | ICD-10-CM | POA: Diagnosis present

## 2024-04-04 DIAGNOSIS — K2980 Duodenitis without bleeding: Secondary | ICD-10-CM | POA: Diagnosis present

## 2024-04-04 DIAGNOSIS — M1712 Unilateral primary osteoarthritis, left knee: Secondary | ICD-10-CM | POA: Diagnosis present

## 2024-04-04 LAB — HEMOGLOBIN A1C
Hgb A1c MFr Bld: 5.7 % — ABNORMAL HIGH (ref 4.8–5.6)
Mean Plasma Glucose: 116.89 mg/dL

## 2024-04-04 LAB — COMPREHENSIVE METABOLIC PANEL WITH GFR
ALT: 11 U/L (ref 0–44)
AST: 25 U/L (ref 15–41)
Albumin: 3.5 g/dL (ref 3.5–5.0)
Alkaline Phosphatase: 52 U/L (ref 38–126)
Anion gap: 16 — ABNORMAL HIGH (ref 5–15)
BUN: 9 mg/dL (ref 8–23)
CO2: 21 mmol/L — ABNORMAL LOW (ref 22–32)
Calcium: 9.2 mg/dL (ref 8.9–10.3)
Chloride: 84 mmol/L — ABNORMAL LOW (ref 98–111)
Creatinine, Ser: 1.07 mg/dL (ref 0.61–1.24)
GFR, Estimated: >60 mL/min (ref >60)
Glucose, Bld: 100 mg/dL — ABNORMAL HIGH (ref 70–99)
Potassium: 4.4 mmol/L (ref 3.5–5.1)
Sodium: 121 mmol/L — ABNORMAL LOW (ref 135–145)
Total Bilirubin: 0.7 mg/dL (ref 0.0–1.2)
Total Protein: 8.4 g/dL — ABNORMAL HIGH (ref 6.5–8.1)

## 2024-04-04 LAB — LIPASE, BLOOD: Lipase: 37 U/L (ref 11–51)

## 2024-04-04 LAB — CBC WITH DIFFERENTIAL/PLATELET
Abs Immature Granulocytes: 0.04 K/uL (ref 0.00–0.07)
Basophils Absolute: 0 K/uL (ref 0.0–0.1)
Basophils Relative: 0 %
Eosinophils Absolute: 0 K/uL (ref 0.0–0.5)
Eosinophils Relative: 0 %
HCT: 47 % (ref 39.0–52.0)
Hemoglobin: 16.3 g/dL (ref 13.0–17.0)
Immature Granulocytes: 0 %
Lymphocytes Relative: 14 %
Lymphs Abs: 1.5 K/uL (ref 0.7–4.0)
MCH: 31.8 pg (ref 26.0–34.0)
MCHC: 34.7 g/dL (ref 30.0–36.0)
MCV: 91.8 fL (ref 80.0–100.0)
Monocytes Absolute: 0.6 K/uL (ref 0.1–1.0)
Monocytes Relative: 6 %
Neutro Abs: 8.3 K/uL — ABNORMAL HIGH (ref 1.7–7.7)
Neutrophils Relative %: 80 %
Platelets: 335 K/uL (ref 150–400)
RBC: 5.12 MIL/uL (ref 4.22–5.81)
RDW: 12.8 % (ref 11.5–15.5)
WBC: 10.6 K/uL — ABNORMAL HIGH (ref 4.0–10.5)
nRBC: 0 % (ref 0.0–0.2)

## 2024-04-04 LAB — BASIC METABOLIC PANEL WITH GFR
Anion gap: 13 (ref 5–15)
BUN: 11 mg/dL (ref 8–23)
CO2: 25 mmol/L (ref 22–32)
Calcium: 8.9 mg/dL (ref 8.9–10.3)
Chloride: 86 mmol/L — ABNORMAL LOW (ref 98–111)
Creatinine, Ser: 1.4 mg/dL — ABNORMAL HIGH (ref 0.61–1.24)
GFR, Estimated: 55 mL/min — ABNORMAL LOW (ref >60)
Glucose, Bld: 98 mg/dL (ref 70–99)
Potassium: 5.3 mmol/L — ABNORMAL HIGH (ref 3.5–5.1)
Sodium: 124 mmol/L — ABNORMAL LOW (ref 135–145)

## 2024-04-04 LAB — GLUCOSE, CAPILLARY: Glucose-Capillary: 120 mg/dL — ABNORMAL HIGH (ref 70–99)

## 2024-04-04 LAB — HIV ANTIBODY (ROUTINE TESTING W REFLEX): HIV Screen 4th Generation wRfx: NONREACTIVE

## 2024-04-04 LAB — TSH: TSH: 1.407 u[IU]/mL (ref 0.350–4.500)

## 2024-04-04 LAB — ETHANOL: Alcohol, Ethyl (B): <15 mg/dL (ref <15)

## 2024-04-04 MED ORDER — POLYETHYLENE GLYCOL 3350 17 G PO PACK
17.0000 g | PACK | Freq: Every day | ORAL | Status: DC | PRN
  Administered 2024-04-06 – 2024-04-09 (×3): 17 g via ORAL
  Filled 2024-04-04 (×3): qty 1

## 2024-04-04 MED ORDER — PANTOPRAZOLE SODIUM 40 MG IV SOLR
40.0000 mg | Freq: Once | INTRAVENOUS | Status: AC
  Administered 2024-04-04: 40 mg via INTRAVENOUS
  Filled 2024-04-04: qty 10

## 2024-04-04 MED ORDER — SODIUM CHLORIDE 0.9 % IV SOLN: Freq: Once | INTRAVENOUS | Status: AC

## 2024-04-04 MED ORDER — SODIUM CHLORIDE 0.9 % IV SOLN: INTRAVENOUS | Status: DC

## 2024-04-04 MED ORDER — IOHEXOL 350 MG/ML SOLN
75.0000 mL | Freq: Once | INTRAVENOUS | Status: AC | PRN
  Administered 2024-04-04: 75 mL via INTRAVENOUS

## 2024-04-04 MED ORDER — NICOTINE 14 MG/24HR TD PT24
14.0000 mg | MEDICATED_PATCH | Freq: Every day | TRANSDERMAL | Status: DC
  Administered 2024-04-04 – 2024-04-13 (×10): 14 mg via TRANSDERMAL
  Filled 2024-04-04 (×10): qty 1

## 2024-04-04 MED ORDER — LORAZEPAM 1 MG PO TABS
0.0000 mg | ORAL_TABLET | Freq: Four times a day (QID) | ORAL | Status: AC
  Filled 2024-04-04: qty 1

## 2024-04-04 MED ORDER — ONDANSETRON HCL 4 MG PO TABS: 4.0000 mg | ORAL_TABLET | Freq: Four times a day (QID) | ORAL | Status: DC | PRN

## 2024-04-04 MED ORDER — SERTRALINE HCL 100 MG PO TABS
200.0000 mg | ORAL_TABLET | Freq: Every day | ORAL | Status: DC
  Administered 2024-04-04 – 2024-04-13 (×10): 200 mg via ORAL
  Filled 2024-04-04 (×11): qty 2

## 2024-04-04 MED ORDER — THIAMINE MONONITRATE 100 MG PO TABS
100.0000 mg | ORAL_TABLET | Freq: Every day | ORAL | Status: DC
  Administered 2024-04-04 – 2024-04-13 (×10): 100 mg via ORAL
  Filled 2024-04-04 (×10): qty 1

## 2024-04-04 MED ORDER — ONDANSETRON HCL 4 MG/2ML IJ SOLN: 4.0000 mg | Freq: Four times a day (QID) | INTRAMUSCULAR | Status: DC | PRN

## 2024-04-04 MED ORDER — HYDROCODONE-ACETAMINOPHEN 10-325 MG PO TABS
1.0000 | ORAL_TABLET | Freq: Four times a day (QID) | ORAL | Status: DC | PRN
  Administered 2024-04-04: 2 via ORAL
  Filled 2024-04-04: qty 2

## 2024-04-04 MED ORDER — OXYCODONE HCL 5 MG PO TABS
5.0000 mg | ORAL_TABLET | ORAL | Status: DC | PRN
  Administered 2024-04-05 (×3): 5 mg via ORAL
  Filled 2024-04-04 (×3): qty 1

## 2024-04-04 MED ORDER — HYDROXYZINE HCL 25 MG PO TABS
25.0000 mg | ORAL_TABLET | Freq: Three times a day (TID) | ORAL | Status: DC | PRN
  Administered 2024-04-04 – 2024-04-08 (×7): 25 mg via ORAL
  Filled 2024-04-04 (×8): qty 1

## 2024-04-04 MED ORDER — ACETAMINOPHEN 325 MG PO TABS
650.0000 mg | ORAL_TABLET | Freq: Four times a day (QID) | ORAL | Status: DC | PRN
  Administered 2024-04-04: 650 mg via ORAL
  Filled 2024-04-04: qty 2

## 2024-04-04 MED ORDER — THIAMINE HCL 100 MG/ML IJ SOLN
100.0000 mg | Freq: Every day | INTRAMUSCULAR | Status: DC
  Filled 2024-04-04: qty 2

## 2024-04-04 MED ORDER — PANTOPRAZOLE SODIUM 40 MG PO TBEC
40.0000 mg | DELAYED_RELEASE_TABLET | Freq: Two times a day (BID) | ORAL | Status: DC
  Administered 2024-04-05 – 2024-04-07 (×6): 40 mg via ORAL
  Filled 2024-04-04 (×7): qty 1

## 2024-04-04 MED ORDER — OXYCODONE-ACETAMINOPHEN 5-325 MG PO TABS
2.0000 | ORAL_TABLET | Freq: Once | ORAL | Status: AC
  Administered 2024-04-04: 2 via ORAL
  Filled 2024-04-04: qty 2

## 2024-04-04 MED ORDER — SODIUM CHLORIDE 0.9 % IV BOLUS
500.0000 mL | Freq: Once | INTRAVENOUS | Status: AC
  Administered 2024-04-04: 500 mL via INTRAVENOUS

## 2024-04-04 MED ORDER — ALBUTEROL SULFATE (2.5 MG/3ML) 0.083% IN NEBU
2.5000 mg | INHALATION_SOLUTION | RESPIRATORY_TRACT | Status: DC | PRN
Start: 2024-04-04 — End: 2024-04-13
  Administered 2024-04-09 – 2024-04-10 (×2): 2.5 mg via RESPIRATORY_TRACT
  Filled 2024-04-04 (×2): qty 3

## 2024-04-04 MED ORDER — LORAZEPAM 2 MG/ML IJ SOLN: 0.0000 mg | Freq: Two times a day (BID) | INTRAMUSCULAR | Status: AC

## 2024-04-04 MED ORDER — VITAMIN B-12 1000 MCG PO TABS
500.0000 ug | ORAL_TABLET | Freq: Every day | ORAL | Status: DC
  Administered 2024-04-05 – 2024-04-13 (×9): 1000 ug via ORAL
  Filled 2024-04-04 (×9): qty 1

## 2024-04-04 MED ORDER — LORAZEPAM 2 MG/ML IJ SOLN
0.0000 mg | Freq: Four times a day (QID) | INTRAMUSCULAR | Status: AC
  Filled 2024-04-04: qty 1

## 2024-04-04 MED ORDER — ENOXAPARIN SODIUM 40 MG/0.4ML IJ SOSY
40.0000 mg | PREFILLED_SYRINGE | INTRAMUSCULAR | Status: DC
  Administered 2024-04-05 – 2024-04-13 (×8): 40 mg via SUBCUTANEOUS
  Filled 2024-04-04 (×9): qty 0.4

## 2024-04-04 MED ORDER — MELATONIN 3 MG PO TABS
3.0000 mg | ORAL_TABLET | Freq: Every day | ORAL | Status: DC
  Administered 2024-04-04 – 2024-04-12 (×9): 3 mg via ORAL
  Filled 2024-04-04 (×9): qty 1

## 2024-04-04 MED ORDER — ACETAMINOPHEN 650 MG RE SUPP: 650.0000 mg | Freq: Four times a day (QID) | RECTAL | Status: DC | PRN

## 2024-04-04 MED ORDER — METOPROLOL TARTRATE 5 MG/5ML IV SOLN: 5.0000 mg | Freq: Four times a day (QID) | INTRAVENOUS | Status: DC | PRN

## 2024-04-04 MED ORDER — LORAZEPAM 1 MG PO TABS: 0.0000 mg | ORAL_TABLET | Freq: Two times a day (BID) | ORAL | Status: AC

## 2024-04-04 NOTE — ED Provider Triage Note (Signed)
 Emergency Medicine Provider Triage Evaluation Note  Payten Beaumier. , a 68 y.o. male  was evaluated in triage.  Pt complains of scrotal pain for the last 2 months.  Has had an ultrasound in the past on this.  Also complaining of bilateral chronic knee pain and epigastric pain states it hurts to swallow.  Denies fever or chills.  Review of Systems  Positive:  Negative: See above   Physical Exam  BP (!) 142/103 (BP Location: Left Arm)   Pulse (!) 103   Temp 98.1 F (36.7 C) (Oral)   Resp 16   Ht 5' 8 (1.727 m)   Wt 75.8 kg   SpO2 98%   BMI 25.41 kg/m  Gen:   Awake, no distress   Resp:  Normal effort  MSK:   Moves extremities without difficulty  Other:    Medical Decision Making  Medically screening exam initiated at 2:05 PM.  Appropriate orders placed.  [name]. was informed that the remainder of the evaluation will be completed by another provider, this initial triage assessment does not replace that evaluation, and the importance of remaining in the ED until their evaluation is complete.     Theotis Peers [place], [place] 04/04/24 [phone]

## 2024-04-04 NOTE — Hospital Course (Signed)
 Patient is a 68 year old male with past medical history of paroxysmal a flutter, HTN, CHFpEF, polysubstance use, who admits to drinking at least 1 beer daily and reports being on chronic pain meds daily who presents to the ED with testicular pain on the left.  He notes the pains been there approximately 2 months.  He also reports bilateral knee pain.  He has some epigastric pain and history of esophageal ulcers though he denies hematemesis dark stools.  In the ED he had a negative scrotal Doppler and they noted no significant knee swelling.  Labs were significant for hyponatremia with sodium of 121.  We were asked to admit for hyponatremia.  Per patient he has had very limited food over the last 3 days.  He states the place he is living is not feeding him.

## 2024-04-04 NOTE — Assessment & Plan Note (Signed)
 Unclear etiology, negative scrotal ultrasound negative exam per EDP Will check CT just to ensure there is no other infection brewing.

## 2024-04-04 NOTE — Assessment & Plan Note (Signed)
 Every 12 hours BMP Normal saline for IV fluid repletion Improve his diet

## 2024-04-04 NOTE — ED Notes (Signed)
Pt provided with urinal for UA.

## 2024-04-04 NOTE — ED Notes (Signed)
 IV team bedside. 12 lead monitoring at completion of IV placement.

## 2024-04-04 NOTE — Assessment & Plan Note (Signed)
 Reports chronic oxycodone  use Will continue this

## 2024-04-04 NOTE — Assessment & Plan Note (Signed)
 Currently on no meds Carb modified diet Check A1c CBGs

## 2024-04-04 NOTE — ED Notes (Signed)
 Pt transported to imaging. Transport asked to transfer pt to 5N after imaging.

## 2024-04-04 NOTE — Assessment & Plan Note (Signed)
 CIWA protocol Patient denies any history of DTs or seizures with not drinking.

## 2024-04-04 NOTE — H&P (Signed)
 History and Physical    Patient: Todd [name]. FMW:[id number] DOB: 10-21-1955 DOA: 04/04/2024 DOS: the patient was seen and examined on 04/04/2024 PCP: Dwight Trula SQUIBB, MD  Patient coming from: Home  Chief Complaint:  Chief Complaint  Patient presents with   Knee Pain   Testicle Pain   Sore Throat   HPI: Todd [name]. is a 68 y.o. male with medical history significant of paroxysmal a flutter, HTN, CHFpEF, polysubstance use, who admits to drinking at least 1 beer daily and reports being on chronic pain meds daily who presents to the ED with testicular pain on the left.  He notes the pains been there approximately 2 months.  He also reports bilateral knee pain.  He has some epigastric pain and history of esophageal ulcers though he denies hematemesis dark stools.  In the ED he had a negative scrotal Doppler and they noted no significant knee swelling.  Labs were significant for hyponatremia with sodium of 121.  We were asked to admit for hyponatremia.  Per patient he has had very limited food over the last 3 days.  He states the place he is living is not feeding him.  Review of Systems: As mentioned in the history of present illness. All other systems reviewed and are negative. Past Medical History:  Diagnosis Date   Arthritis    fingers (07/14/2018)   Depression    DVT (deep venous thrombosis) (HCC) LLE   Hepatitis C     finished harvoni tx ~ 2017   Hypercholesterolemia    Hypertension    Prostate cancer (HCC) 48yrs ago   Pulmonary embolism and infarction (HCC) 07/13/2018   Sleep apnea    not currently using cpap, mask causing vertigo   Type II diabetes mellitus (HCC)    Past Surgical History:  Procedure Laterality Date   BIOPSY  01/12/2019   Procedure: BIOPSY;  Surgeon: Donnald Charleston, MD;  Location: [facility] ENDOSCOPY;  Service: Endoscopy;;   BIOPSY  06/23/2019   Procedure: BIOPSY;  Surgeon: Burnette Fallow, MD;  Location: [facility] ENDOSCOPY;  Service: Endoscopy;;   COLONOSCOPY  WITH PROPOFOL  N/A 02/19/2014   Procedure: COLONOSCOPY WITH PROPOFOL ;  Surgeon: Gladis MARLA Louder, MD;  Location: [facility] ENDOSCOPY;  Service: Endoscopy;  Laterality: N/A;   ENDOVENOUS ABLATION SAPHENOUS VEIN W/ LASER Left 11/22/2017   endovenous laser ablation L SSV by Lynwood Collum MD    ESOPHAGEAL BRUSHING  06/23/2019   Procedure: ESOPHAGEAL BRUSHING;  Surgeon: Burnette Fallow, MD;  Location: [facility] ENDOSCOPY;  Service: Endoscopy;;   ESOPHAGOGASTRODUODENOSCOPY (EGD) WITH PROPOFOL  N/A 01/12/2019   Procedure: ESOPHAGOGASTRODUODENOSCOPY (EGD) WITH PROPOFOL ;  Surgeon: Donnald Charleston, MD;  Location: [facility] ENDOSCOPY;  Service: Endoscopy;  Laterality: N/A;  Patient is also scheduled for barium swallow; please notify radiology after patient's EGD is complete so that barium swallow follows the endoscopy, not vice versa   ESOPHAGOGASTRODUODENOSCOPY (EGD) WITH PROPOFOL  N/A 06/23/2019   Procedure: ESOPHAGOGASTRODUODENOSCOPY (EGD) WITH PROPOFOL ;  Surgeon: Burnette Fallow, MD;  Location: [facility] ENDOSCOPY;  Service: Endoscopy;  Laterality: N/A;   PROSTATECTOMY  2008   REPAIR QUADRICEPS / HAMSTRING MUSCLE Right    Social History:  reports that he has been smoking cigarettes. He has a 5.4 pack-year smoking history. He has never used smokeless tobacco. He reports current alcohol use. He reports that he does not currently use drugs after having used the following drugs: Cocaine, Heroin, Oxycodone , Morphine , and Fentanyl .  No Known Allergies  Family History  Problem Relation Age of Onset   Cancer Father  PROSTATE    Prior to Admission medications   Medication Sig Start Date End Date Taking? Authorizing Provider  acetaminophen  (TYLENOL ) 500 MG tablet Take 1,000 mg by mouth every 6 (six) hours as needed for fever, headache or moderate pain (pain score 4-6).    [provider]  allopurinol (ZYLOPRIM) 100 MG tablet Take 200 mg by mouth daily. Patient not taking: Reported on 10/11/2023 07/06/23   [provider]  diltiazem  (CARDIZEM  CD) 180 MG 24 hr capsule Take 1 capsule (180 mg total) by mouth daily. Patient not taking: Reported on 01/12/2024 10/28/23   Samtani, Jai-Gurmukh, MD  DULoxetine  (CYMBALTA ) 20 MG capsule Take 2 capsules (40 mg total) by mouth daily. Patient not taking: Reported on 01/12/2024 10/28/23   Samtani, Jai-Gurmukh, MD  hydrOXYzine  (ATARAX ) 25 MG tablet Take 1 tablet (25 mg total) by mouth 3 (three) times daily as needed for anxiety. Patient not taking: Reported on 01/12/2024 10/28/23   Samtani, Jai-Gurmukh, MD  melatonin 3 MG TABS tablet Take 3 mg by mouth at bedtime.    [provider]  trazodone  (DESYREL ) 300 MG tablet Take 300 mg by mouth at bedtime. Patient not taking: Reported on 01/12/2024    [provider]  Vitamin D, Ergocalciferol, (DRISDOL) 1.25 MG (50000 UNIT) CAPS capsule Take 50,000 Units by mouth every 7 (seven) days. Patient not taking: Reported on 08/08/2023 07/06/23   [provider]    Physical Exam: Vitals:   04/04/24 1334 04/04/24 1335 04/04/24 1715 04/04/24 1800  BP:  (!) 142/103 128/64 126/85  Pulse:  (!) 103 70 95  Resp:  16 13   Temp:  98.1 F (36.7 C)    TempSrc:  Oral    SpO2:  98% 98% 94%  Weight: 75.8 kg     Height: 5' 8 (1.727 m)      Physical Examination: General appearance - chronically ill appearing Neck - supple, no significant adenopathy Chest - clear to auscultation, no wheezes, rales or rhonchi, symmetric air entry Heart - normal rate, regular rhythm, normal S1, S2, no murmurs, rubs, clicks or gallops Abdomen - soft, nontender, nondistended, no masses or organomegaly Extremities - peripheral pulses normal, no pedal edema, no clubbing or cyanosis  Data Reviewed: Results for orders placed or performed during the hospital encounter of 04/04/24 (from the past 24 hours)  CBC with Differential     Status: Abnormal   Collection Time: 04/04/24  2:05 PM  Result Value Ref Range   WBC 10.6 (H) 4.0 - 10.5 K/uL   RBC  5.12 4.22 - 5.81 MIL/uL   Hemoglobin 16.3 13.0 - 17.0 g/dL   HCT 52.9 60.9 - 47.9 %   MCV 91.8 80.0 - 100.0 fL   MCH 31.8 26.0 - 34.0 pg   MCHC 34.7 30.0 - 36.0 g/dL   RDW 87.1 88.4 - 84.4 %   Platelets 335 150 - 400 K/uL   nRBC 0.0 0.0 - 0.2 %   Neutrophils Relative % 80 %   Neutro Abs 8.3 (H) 1.7 - 7.7 K/uL   Lymphocytes Relative 14 %   Lymphs Abs 1.5 0.7 - 4.0 K/uL   Monocytes Relative 6 %   Monocytes Absolute 0.6 0.1 - 1.0 K/uL   Eosinophils Relative 0 %   Eosinophils Absolute 0.0 0.0 - 0.5 K/uL   Basophils Relative 0 %   Basophils Absolute 0.0 0.0 - 0.1 K/uL   Immature Granulocytes 0 %   Abs Immature Granulocytes 0.04 0.00 - 0.07 K/uL  Comprehensive metabolic panel     Status: Abnormal   Collection Time: 04/04/24  2:05 PM  Result Value Ref Range   Sodium 121 (L) 135 - 145 mmol/L   Potassium 4.4 3.5 - 5.1 mmol/L   Chloride 84 (L) 98 - 111 mmol/L   CO2 21 (L) 22 - 32 mmol/L   Glucose, Bld 100 (H) 70 - 99 mg/dL   BUN 9 8 - 23 mg/dL   Creatinine, Ser 8.92 0.61 - 1.24 mg/dL   Calcium  9.2 8.9 - 10.3 mg/dL   Total Protein 8.4 (H) 6.5 - 8.1 g/dL   Albumin 3.5 3.5 - 5.0 g/dL   AST 25 15 - 41 U/L   ALT 11 0 - 44 U/L   Alkaline Phosphatase 52 38 - 126 U/L   Total Bilirubin 0.7 0.0 - 1.2 mg/dL   GFR, Estimated >39 >39 mL/min   Anion gap 16 (H) 5 - 15  Lipase, blood     Status: None   Collection Time: 04/04/24  6:44 PM  Result Value Ref Range   Lipase 37 11 - 51 U/L  Ethanol     Status: None   Collection Time: 04/04/24  6:44 PM  Result Value Ref Range   Alcohol, Ethyl (B) <15 <15 mg/dL   US  SCROTUM W/DOPPLER Result Date: 04/04/2024 CLINICAL DATA:  203464 Scrotal pain 203464 EXAM: SCROTAL ULTRASOUND DOPPLER ULTRASOUND OF THE TESTICLES TECHNIQUE: Complete ultrasound examination of the testicles, epididymis, and other scrotal structures was performed. Color and spectral Doppler ultrasound were also utilized to evaluate blood flow to the testicles. COMPARISON:  Dec 24, 2009  FINDINGS: Right testicle Measurements: 2.7 x 2 x 2.5 cm. No mass or microlithiasis visualized. Striated heterogeneity of the testicular parenchyma, which may represent age-related seminiferous tubular atrophy. Left testicle Measurements: 2.9 x 2 x 2.4 cm. No mass or microlithiasis visualized. Striated heterogeneity of the testicular parenchyma, which may represent age-related seminiferous tubular atrophy. Curvilinear calcification with shadowing along the posterior testicle, possibly a scrotolith. Right epididymis:  Normal in size and appearance. Left epididymis:  Normal in size and appearance. Hydrocele:  Small volume right hydrocele.  Trace left hydrocele. Varicocele:  None visualized. Pulsed Doppler interrogation of both testes demonstrates normal low resistance arterial and venous waveforms bilaterally. IMPRESSION: 1. No acute sonographic abnormality within the testicles; more specifically, no testicular mass, findings of epididymo-orchitis, or changes of testicular torsion, at this time. 2. Small right and trace left-sided hydroceles. Electronically Signed   By: Rogelia Myers M.D.   On: 04/04/2024 16:14     Assessment and Plan: * Hyponatremia Every 12 hours BMP Normal saline for IV fluid repletion Improve his diet  Testicular pain, left Unclear etiology, negative scrotal ultrasound negative exam per EDP Will check CT just to ensure there is no other infection brewing.  History of prostate cancer Status post prostatectomy  Acute bilateral knee pain Reports chronic oxycodone  use Will continue this  Atrial flutter (HCC) Continue beta-blocker  Ulcerative esophagitis PPI  Alcohol abuse CIWA protocol Patient denies any history of DTs or seizures with not drinking.  Primary hypertension Continue diltiazem  Lopressor  as needed  Affective bipolar disorder (HCC) Currently on no meds  Non-insulin  dependent type 2 diabetes mellitus (HCC) Currently on no meds Carb modified  diet Check A1c CBGs      Advance Care Planning:   Code Status: Prior full, confirmed with patient  Consults: None  Family Communication: Patient at bedside  Severity of Illness: The appropriate patient status for this patient  is INPATIENT. Inpatient status is judged to be reasonable and necessary in order to provide the required intensity of service to ensure the patient's safety. The patient's presenting symptoms, physical exam findings, and initial radiographic and laboratory data in the context of their chronic comorbidities is felt to place them at high risk for further clinical deterioration. Furthermore, it is not anticipated that the patient will be medically stable for discharge from the hospital within 2 midnights of admission.   * I certify that at the point of admission it is my clinical judgment that the patient will require inpatient hospital care spanning beyond 2 midnights from the point of admission due to high intensity of service, high risk for further deterioration and high frequency of surveillance required.*  Author: Glenys GORMAN Birk, MD 04/04/2024 6:35 PM  For on call review [email or web address].

## 2024-04-04 NOTE — ED Triage Notes (Signed)
 Pt BIB EMS for BL knee pain, testicular pain, and sore throat.. All pain has been going on for a few months. Denies injury, denies urinary symptoms. No penile discharge.

## 2024-04-04 NOTE — ED Provider Notes (Signed)
 [place] [facility]OSPITAL Provider Note   CSN: [id number] Arrival date & time: 04/04/24  1330     Patient presents with: Knee Pain, Testicle Pain, and Sore Throat   Todd [name]. is a 68 y.o. male.   HPI Patient has multiple complaints.  Patient reports that he is having testicular pain.  He reports this has been ongoing for several months.  No penile drainage or pain with urination.  Patient reports that he is not sexually active.  He reports he had prostate cancer and prostatectomy he has not been sexually active for many years.  Patient reports he has had this evaluated in the past but there is never been a clear diagnosis for why he is in pain.  Patient reports he also has a lot of pain in his knees.  Reports they both hurt but the left hurts more.  Patient does have history of chronic pain in the left knee.  By EMR history of probable gout.  However at this time patient denies actual redness or swelling that is painful for him to walk and stand.  No fevers no chills. Patient reports epigastric pain with swallowing no vomiting.     Prior to Admission medications   Medication Sig Start Date End Date Taking? Authorizing Provider  acetaminophen  (TYLENOL ) 500 MG tablet Take 1,000 mg by mouth every 6 (six) hours as needed for fever, headache or moderate pain (pain score 4-6).    [provider]  allopurinol (ZYLOPRIM) 100 MG tablet Take 200 mg by mouth daily. Patient not taking: Reported on 10/11/2023 07/06/23   [provider]  diltiazem  (CARDIZEM  CD) 180 MG 24 hr capsule Take 1 capsule (180 mg total) by mouth daily. Patient not taking: Reported on 01/12/2024 10/28/23   Samtani, Jai-Gurmukh, MD  DULoxetine  (CYMBALTA ) 20 MG capsule Take 2 capsules (40 mg total) by mouth daily. Patient not taking: Reported on 01/12/2024 10/28/23   Samtani, Jai-Gurmukh, MD  hydrOXYzine  (ATARAX ) 25 MG tablet Take 1 tablet (25 mg total) by mouth 3 (three) times  daily as needed for anxiety. Patient not taking: Reported on 01/12/2024 10/28/23   Samtani, Jai-Gurmukh, MD  melatonin 3 MG TABS tablet Take 3 mg by mouth at bedtime.    [provider]  trazodone  (DESYREL ) 300 MG tablet Take 300 mg by mouth at bedtime. Patient not taking: Reported on 01/12/2024    [provider]  Vitamin D, Ergocalciferol, (DRISDOL) 1.25 MG (50000 UNIT) CAPS capsule Take 50,000 Units by mouth every 7 (seven) days. Patient not taking: Reported on 08/08/2023 07/06/23   [provider]    Allergies: Patient has no known allergies.    Review of Systems  Updated Vital Signs BP 126/85   Pulse 95   Temp 98.1 F (36.7 C) (Oral)   Resp 13   Ht 5' 8 (1.727 m)   Wt 75.8 kg   SpO2 94%   BMI 25.41 kg/m   Physical Exam Constitutional:      Comments: Alert nontoxic no respiratory distress  HENT:     Mouth/Throat:     Pharynx: Oropharynx is clear.  Eyes:     Extraocular Movements: Extraocular movements intact.  Cardiovascular:     Rate and Rhythm: Normal rate.  Pulmonary:     Effort: Pulmonary effort is normal.     Breath sounds: Normal breath sounds.  Abdominal:     General: There is no distension.     Palpations: Abdomen is soft.  Tenderness: There is no abdominal tenderness.  Musculoskeletal:     Comments: Bilateral knees show some thickening consistent osteoarthritis but no erythema or effusion.  No significant peripheral edema.  Skin:    General: Skin is warm and dry.  Neurological:     General: No focal deficit present.     Mental Status: He is oriented to person, place, and time.     Comments: No focal motor deficit.  Patient is alert to situation oriented.  Speech clear.     (all labs ordered are listed, but only abnormal results are displayed) Labs Reviewed  CBC WITH DIFFERENTIAL/PLATELET - Abnormal; Notable for the following components:      Result Value   WBC 10.6 (*)    Neutro Abs 8.3 (*)    All other components  within normal limits  COMPREHENSIVE METABOLIC PANEL WITH GFR - Abnormal; Notable for the following components:   Sodium 121 (*)    Chloride 84 (*)    CO2 21 (*)    Glucose, Bld 100 (*)    Total Protein 8.4 (*)    Anion gap 16 (*)    All other components within normal limits  URINALYSIS, ROUTINE W REFLEX MICROSCOPIC  LIPASE, BLOOD  RAPID URINE DRUG SCREEN, HOSP PERFORMED  ETHANOL    EKG: None  Radiology: US  SCROTUM W/DOPPLER Result Date: 04/04/2024 CLINICAL DATA:  203464 Scrotal pain 203464 EXAM: SCROTAL ULTRASOUND DOPPLER ULTRASOUND OF THE TESTICLES TECHNIQUE: Complete ultrasound examination of the testicles, epididymis, and other scrotal structures was performed. Color and spectral Doppler ultrasound were also utilized to evaluate blood flow to the testicles. COMPARISON:  Dec 24, 2009 FINDINGS: Right testicle Measurements: 2.7 x 2 x 2.5 cm. No mass or microlithiasis visualized. Striated heterogeneity of the testicular parenchyma, which may represent age-related seminiferous tubular atrophy. Left testicle Measurements: 2.9 x 2 x 2.4 cm. No mass or microlithiasis visualized. Striated heterogeneity of the testicular parenchyma, which may represent age-related seminiferous tubular atrophy. Curvilinear calcification with shadowing along the posterior testicle, possibly a scrotolith. Right epididymis:  Normal in size and appearance. Left epididymis:  Normal in size and appearance. Hydrocele:  Small volume right hydrocele.  Trace left hydrocele. Varicocele:  None visualized. Pulsed Doppler interrogation of both testes demonstrates normal low resistance arterial and venous waveforms bilaterally. IMPRESSION: 1. No acute sonographic abnormality within the testicles; more specifically, no testicular mass, findings of epididymo-orchitis, or changes of testicular torsion, at this time. 2. Small right and trace left-sided hydroceles. Electronically Signed   By: Rogelia Myers M.D.   On: 04/04/2024 16:14      Procedures   Medications Ordered in the ED  pantoprazole  (PROTONIX ) injection 40 mg (has no administration in time range)  0.9 %  sodium chloride  infusion (has no administration in time range)  LORazepam  (ATIVAN ) injection 0-4 mg (has no administration in time range)    Or  LORazepam  (ATIVAN ) tablet 0-4 mg (has no administration in time range)  LORazepam  (ATIVAN ) injection 0-4 mg (has no administration in time range)    Or  LORazepam  (ATIVAN ) tablet 0-4 mg (has no administration in time range)  thiamine  (VITAMIN B1) tablet 100 mg (has no administration in time range)    Or  thiamine  (VITAMIN B1) injection 100 mg (has no administration in time range)  sodium chloride  0.9 % bolus 500 mL (has no administration in time range)  oxyCODONE -acetaminophen  (PERCOCET/ROXICET) 5-325 MG per tablet 2 tablet (2 tablets Oral Given 04/04/24 1423)  Medical Decision Making Amount and/or Complexity of Data Reviewed Labs: ordered.  Risk OTC drugs. Prescription drug management. Decision regarding hospitalization.  Patient presents as outlined.  He has multiple complaints at this time.  Triage provider has initiated a diagnostic evaluation.  At this time labs have returned with sodium 121, down from 132 is baseline.  GFR greater than 60 anion gap 16.  White count 10.6 ethanol negative.  It was patient significant hyponatremia and report of epigastric pain and difficulty eating will plan for admission.  Patient does report alcohol use although alcohol is negative at this time.  Consistent with alcohol gastritis and hyponatremia.    At this time patient's clinical exam as far as his knees are concerned do not show any effusions or erythema.  No signs of septic joint.  Most consistent with chronic knee pain.  Consult: Dr. Fredirick hospitalist for admission     Final diagnoses:  Hyponatremia  Acute alcoholic gastritis without hemorrhage  Testicular pain, left   Chronic pain of both knees    ED Discharge Orders     None          Armenta Canning, MD 04/11/24 1721

## 2024-04-04 NOTE — Assessment & Plan Note (Signed)
Currently on no meds

## 2024-04-04 NOTE — Assessment & Plan Note (Signed)
 Continue diltiazem  Lopressor  as needed

## 2024-04-04 NOTE — ED Notes (Signed)
 Transport called. No answer.

## 2024-04-04 NOTE — Assessment & Plan Note (Signed)
 Status post prostatectomy

## 2024-04-04 NOTE — Assessment & Plan Note (Signed)
 Continue beta blocker.

## 2024-04-04 NOTE — Assessment & Plan Note (Signed)
 PPI

## 2024-04-05 ENCOUNTER — Inpatient Hospital Stay (HOSPITAL_COMMUNITY)

## 2024-04-05 DIAGNOSIS — E871 Hypo-osmolality and hyponatremia: Secondary | ICD-10-CM | POA: Diagnosis not present

## 2024-04-05 LAB — RAPID URINE DRUG SCREEN, HOSP PERFORMED
Amphetamines: NOT DETECTED
Barbiturates: NOT DETECTED
Benzodiazepines: NOT DETECTED
Cocaine: NOT DETECTED
Opiates: POSITIVE — AB
Tetrahydrocannabinol: NOT DETECTED

## 2024-04-05 LAB — BASIC METABOLIC PANEL WITH GFR
Anion gap: 10 (ref 5–15)
Anion gap: 14 (ref 5–15)
BUN: 12 mg/dL (ref 8–23)
BUN: 14 mg/dL (ref 8–23)
CO2: 26 mmol/L (ref 22–32)
CO2: 25 mmol/L (ref 22–32)
Calcium: 9.2 mg/dL (ref 8.9–10.3)
Calcium: 9.5 mg/dL (ref 8.9–10.3)
Chloride: 90 mmol/L — ABNORMAL LOW (ref 98–111)
Chloride: 88 mmol/L — ABNORMAL LOW (ref 98–111)
Creatinine, Ser: 1.19 mg/dL (ref 0.61–1.24)
Creatinine, Ser: 1.41 mg/dL — ABNORMAL HIGH (ref 0.61–1.24)
GFR, Estimated: >60 mL/min (ref >60)
GFR, Estimated: 54 mL/min — ABNORMAL LOW (ref >60)
Glucose, Bld: 95 mg/dL (ref 70–99)
Glucose, Bld: 104 mg/dL — ABNORMAL HIGH (ref 70–99)
Potassium: 4.8 mmol/L (ref 3.5–5.1)
Potassium: 4.9 mmol/L (ref 3.5–5.1)
Sodium: 126 mmol/L — ABNORMAL LOW (ref 135–145)
Sodium: 127 mmol/L — ABNORMAL LOW (ref 135–145)

## 2024-04-05 LAB — URINALYSIS, ROUTINE W REFLEX MICROSCOPIC
Bacteria, UA: NONE SEEN
Bilirubin Urine: NEGATIVE
Glucose, UA: NEGATIVE mg/dL
Ketones, ur: NEGATIVE mg/dL
Nitrite: NEGATIVE
Protein, ur: NEGATIVE mg/dL
Specific Gravity, Urine: 1.02 (ref 1.005–1.030)
WBC, UA: >50 WBC/hpf (ref 0–5)
pH: 6 (ref 5.0–8.0)

## 2024-04-05 LAB — GLUCOSE, CAPILLARY
Glucose-Capillary: 93 mg/dL (ref 70–99)
Glucose-Capillary: 83 mg/dL (ref 70–99)
Glucose-Capillary: 101 mg/dL — ABNORMAL HIGH (ref 70–99)

## 2024-04-05 LAB — COMPREHENSIVE METABOLIC PANEL WITH GFR
ALT: 10 U/L (ref 0–44)
AST: 20 U/L (ref 15–41)
Albumin: 3 g/dL — ABNORMAL LOW (ref 3.5–5.0)
Alkaline Phosphatase: 37 U/L — ABNORMAL LOW (ref 38–126)
Anion gap: 11 (ref 5–15)
BUN: 12 mg/dL (ref 8–23)
CO2: 24 mmol/L (ref 22–32)
Calcium: 9.1 mg/dL (ref 8.9–10.3)
Chloride: 88 mmol/L — ABNORMAL LOW (ref 98–111)
Creatinine, Ser: 1.38 mg/dL — ABNORMAL HIGH (ref 0.61–1.24)
GFR, Estimated: 56 mL/min — ABNORMAL LOW (ref >60)
Glucose, Bld: 95 mg/dL (ref 70–99)
Potassium: 5 mmol/L (ref 3.5–5.1)
Sodium: 123 mmol/L — ABNORMAL LOW (ref 135–145)
Total Bilirubin: 0.6 mg/dL (ref 0.0–1.2)
Total Protein: 6.9 g/dL (ref 6.5–8.1)

## 2024-04-05 LAB — CORTISOL: Cortisol, Plasma: 13.2 ug/dL

## 2024-04-05 LAB — CBC
HCT: 39.5 % (ref 39.0–52.0)
Hemoglobin: 13.6 g/dL (ref 13.0–17.0)
MCH: 31.6 pg (ref 26.0–34.0)
MCHC: 34.4 g/dL (ref 30.0–36.0)
MCV: 91.9 fL (ref 80.0–100.0)
Platelets: 287 K/uL (ref 150–400)
RBC: 4.3 MIL/uL (ref 4.22–5.81)
RDW: 13.2 % (ref 11.5–15.5)
WBC: 8.6 K/uL (ref 4.0–10.5)
nRBC: 0 % (ref 0.0–0.2)

## 2024-04-05 LAB — BRAIN NATRIURETIC PEPTIDE: B Natriuretic Peptide: 56.6 pg/mL (ref 0.0–100.0)

## 2024-04-05 MED ORDER — OXYCODONE HCL 5 MG PO TABS
10.0000 mg | ORAL_TABLET | ORAL | Status: DC | PRN
  Administered 2024-04-05 – 2024-04-13 (×24): 10 mg via ORAL
  Filled 2024-04-05 (×25): qty 2

## 2024-04-05 MED ORDER — DICLOFENAC SODIUM 1 % EX GEL
2.0000 g | Freq: Four times a day (QID) | CUTANEOUS | Status: DC
  Administered 2024-04-05 – 2024-04-12 (×21): 2 g via TOPICAL
  Filled 2024-04-05: qty 100

## 2024-04-05 MED ORDER — ACETAMINOPHEN 500 MG PO TABS
1000.0000 mg | ORAL_TABLET | Freq: Three times a day (TID) | ORAL | Status: DC
  Administered 2024-04-05 – 2024-04-13 (×19): 1000 mg via ORAL
  Filled 2024-04-05 (×24): qty 2

## 2024-04-05 MED ORDER — FOLIC ACID 1 MG PO TABS
1.0000 mg | ORAL_TABLET | Freq: Every day | ORAL | Status: DC
  Administered 2024-04-05 – 2024-04-13 (×9): 1 mg via ORAL
  Filled 2024-04-05 (×9): qty 1

## 2024-04-05 MED ORDER — LORAZEPAM 1 MG PO TABS
1.0000 mg | ORAL_TABLET | Freq: Once | ORAL | Status: AC | PRN
  Administered 2024-04-05: 1 mg via ORAL

## 2024-04-05 MED ORDER — GABAPENTIN 100 MG PO CAPS
100.0000 mg | ORAL_CAPSULE | Freq: Three times a day (TID) | ORAL | Status: DC
  Administered 2024-04-05 (×3): 100 mg via ORAL
  Filled 2024-04-05 (×4): qty 1

## 2024-04-05 MED ORDER — KETOROLAC TROMETHAMINE 15 MG/ML IJ SOLN: 15.0000 mg | Freq: Once | INTRAMUSCULAR | Status: DC

## 2024-04-05 MED ORDER — METFORMIN HCL 500 MG PO TABS
250.0000 mg | ORAL_TABLET | Freq: Two times a day (BID) | ORAL | Status: DC
  Filled 2024-04-05: qty 1

## 2024-04-05 MED ORDER — ARIPIPRAZOLE 5 MG PO TABS
5.0000 mg | ORAL_TABLET | Freq: Every day | ORAL | Status: DC
  Administered 2024-04-05 – 2024-04-06 (×2): 5 mg via ORAL
  Filled 2024-04-05 (×2): qty 1

## 2024-04-05 MED ORDER — OXYCODONE HCL 5 MG PO TABS: 5.0000 mg | ORAL_TABLET | ORAL | Status: DC | PRN

## 2024-04-05 NOTE — Progress Notes (Signed)
     Patient Name: Todd [name].           DOB: [birth year]  MRN: [id number]      Admission Date: 04/04/2024  Attending Provider: Fredirick Glenys RAMAN, MD  Primary Diagnosis: Hyponatremia   Level of care: Telemetry Medical   OVERNIGHT EVENT   Bedside RN concerned with fluid administration on patient who developed wet cough. He has a history of CHFpEF, last echo from 2/25 read LVEF of 55-60%.   Patient recently received 500 cc bolus, followed by continuous IVF.  No respiratory distress ordered.   Orders placed for BNP and chest x-ray.  Holding fluids for now.    Jalisa Sacco, DNP, ACNPC- [facility] [place]

## 2024-04-05 NOTE — Evaluation (Signed)
 Physical Therapy Evaluation Patient Details Name: Todd [name]. MRN: [id number] DOB: [birth year] Today's Date: 04/05/2024  History of Present Illness  68 y.o. male who presents to the ED 8/20 with testicular pain on the left. Found to have Hyponatremia. PMH: paroxysmal a flutter, HTN, CHFpEF, polysubstance use, who admits to drinking at least 1 beer daily and reports being on chronic pain meds daily  Clinical Impression  Pt admitted with above diagnosis. Reported history of PLOF a bit inconsistent but sounds like he was moving decently with a rollator until about 2 months ago, which is the last time he was able to walk to the bathroom for a bath with assistance. He also states he has been transferring himself to [facility] as needed but someone empties the contents every 3 days. States he was able to sit on his rollator and push himself to the porch of his dwelling but has not been able to do this for the past 2 weeks due to functional decline. Reports 2 gel injections for bil knees [facility] without relief, states he needs surgery and an xray because there is no cartilage in his knees. Attributes decline to cold temperature in his place of living and lack of care. Today, he was able to mobilize to the edge of bed without assistance, stand with min assist using a RW for support, and ambulate with RW up to 12 feet with min assist, demonstrating episodes of posterior instability. Reviewed LE exercises and importance of mobility while admitted to prevent further decline in function and other risks associated with immobility. Patient will benefit from continued inpatient follow up therapy, <3 hours/day. Pt currently with functional limitations due to the deficits listed below (see PT Problem List). Pt will benefit from acute skilled PT to increase their independence and safety with mobility to allow discharge.           If plan is discharge home, recommend the following: A little help with  walking and/or transfers;A little help with bathing/dressing/bathroom;Assistance with cooking/housework;Assist for transportation;Help with stairs or ramp for entrance   Can travel by private vehicle        Equipment Recommendations Rollator (4 wheels) (Anticipated - pending progress)  Recommendations for Other Services       Functional Status Assessment Patient has had a recent decline in their functional status and demonstrates the ability to make significant improvements in function in a reasonable and predictable amount of time.     Precautions / Restrictions Precautions Precautions: Fall Recall of Precautions/Restrictions: Intact Restrictions Weight Bearing Restrictions Per Provider Order: No      Mobility  Bed Mobility Overal bed mobility: Needs Assistance Bed Mobility: Supine to Sit, Sit to Supine     Supine to sit: Contact guard, HOB elevated Sit to supine: Min assist   General bed mobility comments: CGA to rise to EOB, extra time no assist. Min assist for LEs back into bed. Cues for technique.    Transfers Overall transfer level: Needs assistance Equipment used: Rolling walker (2 wheels) Transfers: Sit to/from Stand, Bed to chair/wheelchair/BSC Sit to Stand: Min assist   Step pivot transfers: Min assist       General transfer comment: Min assist for boost to stand from bed and recliner. Cues for hand placement. Min assist for balance with step pivot transfer to/from bed and recliner.    Ambulation/Gait Ambulation/Gait assistance: Min assist Gait Distance (Feet): 12 Feet Assistive device: Rolling walker (2 wheels) Gait Pattern/deviations: Step-through pattern, Decreased stride length, Knee  flexed in stance - right, Knee flexed in stance - left, Leaning posteriorly, Shuffle, Trunk flexed Gait velocity: dec Gait velocity interpretation: <1.31 ft/sec, indicative of household ambulator   General Gait Details: Educated on safe AD use with RW for support,  adjusted for UE leverage. Min assist for balance and RW control, leaning posteriorly at times. Knees flexed throughou gait cycle. Cues for upright posture and proximity to device to maximize support as needed. No overt buckling with this short distance.  Stairs            Wheelchair Mobility     Tilt Bed    Modified Rankin (Stroke Patients Only)       Balance Overall balance assessment: Needs assistance Sitting-balance support: No upper extremity supported, Feet supported Sitting balance-Leahy Scale: Good     Standing balance support: Bilateral upper extremity supported, Reliant on assistive device for balance Standing balance-Leahy Scale: Poor                               Pertinent Vitals/Pain Pain Assessment Pain Assessment: Faces Faces Pain Scale: Hurts whole lot Pain Location: knees bil Pain Descriptors / Indicators: Aching Pain Intervention(s): Monitored during session, Repositioned    Home Living Family/patient expects to be discharged to:: Unsure Living Arrangements: Group Home Available Help at Discharge:  (inconsistent) Type of Home: House Home Access: Stairs to enter Entrance Stairs-Rails: Doctor, general practice of Steps: 5   Home Layout: One level Home Equipment: Rollator (4 wheels);Grab bars - tub/shower (States brakes are broken on 4WW)      Prior Function Prior Level of Function : Needs assist             Mobility Comments: States he has not walked in 2 months but has been sitting on rollator to push himself around to the porch. States he has declined over the past 2 months. ADLs Comments: States he would transfer himself to [facility] and someone would empty his bucket every 3 days. Hasn't showered in 2 months. States he can't bathe himself and doesn't have anyone to help.     Extremity/Trunk Assessment   Upper Extremity Assessment Upper Extremity Assessment: Defer to OT evaluation    Lower Extremity Assessment Lower  Extremity Assessment: Generalized weakness       Communication   Communication Communication: Impaired Factors Affecting Communication: Reduced clarity of speech    Cognition Arousal: Alert Behavior During Therapy: WFL for tasks assessed/performed   PT - Cognitive impairments: No apparent impairments                         Following commands: Intact       Cueing Cueing Techniques: Verbal cues, Gestural cues     General Comments General comments (skin integrity, edema, etc.): Educated on importance of mobility with staff assist to reduce risks associated with immobility. He stated multiple times he cannot do any of the tasks we were actually able to complete today.    Exercises General Exercises - Lower Extremity Ankle Circles/Pumps: AROM, Both, 10 reps, Supine Quad Sets: Strengthening, Both, 10 reps, Supine Gluteal Sets: Strengthening, Both, 10 reps, Supine   Assessment/Plan    PT Assessment Patient needs continued PT services  PT Problem List Decreased strength;Decreased range of motion;Decreased activity tolerance;Decreased mobility;Decreased balance;Decreased knowledge of use of DME;Pain       PT Treatment Interventions DME instruction;Gait training;Functional mobility training;Therapeutic activities;Therapeutic exercise;Balance training;Neuromuscular re-education;Patient/family  education;Modalities;Wheelchair mobility training;Manual techniques    PT Goals (Current goals can be found in the Care Plan section)  Acute Rehab PT Goals Patient Stated Goal: Walk normal again PT Goal Formulation: With patient Time For Goal Achievement: 04/19/24 Potential to Achieve Goals: Fair    Frequency Min 1X/week     Co-evaluation               AM-PAC PT 6 Clicks Mobility  Outcome Measure Help needed turning from your back to your side while in a flat bed without using bedrails?: None Help needed moving from lying on your back to sitting on the side of a  flat bed without using bedrails?: A Little Help needed moving to and from a bed to a chair (including a wheelchair)?: A Little Help needed standing up from a chair using your arms (e.g., wheelchair or bedside chair)?: A Little Help needed to walk in hospital room?: A Little Help needed climbing 3-5 steps with a railing? : Total 6 Click Score: 17    End of Session Equipment Utilized During Treatment: Gait belt Activity Tolerance: Patient tolerated treatment well Patient left: in bed;with call bell/phone within reach;with bed alarm set Nurse Communication: Mobility status PT Visit Diagnosis: Unsteadiness on feet (R26.81);Other abnormalities of gait and mobility (R26.89);Muscle weakness (generalized) (M62.81);Difficulty in walking, not elsewhere classified (R26.2);Pain Pain - Right/Left:  (bil) Pain - part of body: Knee    Time: 8379-8352 PT Time Calculation (min) (ACUTE ONLY): 27 min   Charges:   PT Evaluation $PT Eval Low Complexity: 1 Low PT Treatments $Therapeutic Activity: 8-22 mins PT General Charges $$ ACUTE PT VISIT: 1 Visit         Leontine Roads, PT, DPT [facility]  Rehabilitation Services Physical Therapist Office: [phone] Website: [email or web address]   Leontine GORMAN Roads 04/05/2024, 5:13 PM

## 2024-04-05 NOTE — TOC Initial Note (Addendum)
 Transition of Care (TOC) - Initial/Assessment Note    Patient Details  Name: Todd [name]. MRN: [id number] Date of Birth: [birth year]  Transition of Care [facility]) CM/SW Contact:    Rosalva Jon Bloch, RN Phone Number: 04/05/2024, 2:48 PM  Clinical Narrative:      Presents with testicular pain, hyponatremia.      NCM attempted to speak with pt regarding d/c planning. Pt requested NCM speak to his DSS SW, Nat Chang @ [phone]( desk #) or [phone] ( work cell).  NCM called Nat PIES, answered to desk #. Nat states she is an APS SW and is following pt 2/2  APS  neglect call. Nat provided NCM with pt's housing situation prior to current hospital stay. Nat states pt is renting a room in a house and it was agreed tenants would help /assist with pt's ADL's, pt 's day to day needs and the ball was dropped. Nat states pt needs placement. NCM explained to SW with pt's drug and psych hx it's  going to be difficult to place pt.  Pt is separated from wife who lives in [place]. Pt does have a niece that lives in [place], however, she can't assist with care. Case discussed with CSW, CSW following.  PT/OT EVALUATION PENDING...  IPCM following and will assist with needs.  Expected Discharge Plan: Skilled Nursing Facility Barriers to Discharge: No Barriers Identified   Patient Goals and CMS Choice            Expected Discharge Plan and Services In-house Referral: Clinical Social Work Discharge Planning Services: CM Consult                                          Prior Living Arrangements/Services   Lives with:: Other (Comment) (homeless) Patient language and need for interpreter reviewed:: Yes        Need for Family Participation in Patient Care: Yes (Comment) Care giver support system in place?: No (comment)   Criminal Activity/Legal Involvement Pertinent to Current Situation/Hospitalization: No - Comment as needed  Activities of Daily Living    ADL Screening (condition at time of admission) Independently performs ADLs?: No Does the patient have a NEW difficulty with bathing/dressing/toileting/self-feeding that is expected to last >3 days?: Yes (Initiates electronic notice to provider for possible OT consult) Does the patient have a NEW difficulty with getting in/out of bed, walking, or climbing stairs that is expected to last >3 days?: Yes (Initiates electronic notice to provider for possible PT consult) Does the patient have a NEW difficulty with communication that is expected to last >3 days?: No Is the patient deaf or have difficulty hearing?: No Does the patient have difficulty seeing, even when wearing glasses/contacts?: Yes Does the patient have difficulty concentrating, remembering, or making decisions?: No  Permission Sought/Granted                  Emotional Assessment       Orientation: : Oriented to Self, Oriented to Place, Oriented to  Time, Oriented to Situation      Admission diagnosis:  Hyponatremia [E87.1] Testicular pain, left [N50.812] Chronic pain of both knees [M25.561, M25.562, G89.29] Acute alcoholic gastritis without hemorrhage [K29.20] Patient Active Problem List   Diagnosis Date Noted   Alcohol use disorder, severe, dependence (HCC) 01/12/2024   Macrocytic anemia 10/11/2023   Chronic diastolic CHF (congestive heart failure) (HCC) 10/11/2023  SOB (shortness of breath) 10/11/2023   Supraventricular tachycardia (HCC) 10/11/2023   SVT (supraventricular tachycardia) (HCC) 10/10/2023   Suicidal ideation 04/20/2023   Suicidal ideations 04/20/2023   Acute bilateral knee pain 05/19/2022   Ascending aorta enlargement (HCC) 05/19/2022   Esophageal dysphagia 05/19/2022   History of prostate cancer 05/19/2022   Testicular pain, left 05/19/2022   Major depressive disorder, recurrent episode, severe, with psychosis (HCC) 02/15/2022   Cocaine dependence (HCC) 02/15/2022   Paroxysmal atrial flutter  (HCC) 08/31/2021   Tachycardia 08/30/2021   Atrial flutter (HCC) 08/30/2021   Hyponatremia 08/30/2021   Rib fracture 08/30/2021   Compression fracture of body of thoracic vertebra (HCC) 08/30/2021   Pulmonary nodule 08/30/2021   Heroin overdose (HCC) 10/01/2020   Opiate overdose (HCC) 10/01/2020   Ulcerative esophagitis 06/30/2019   Chronic gastritis 04/07/2019   AMS (altered mental status) 04/03/2019   Pulmonary embolus and infarction (HCC) 07/13/2018   Opioid use disorder, moderate, dependence (HCC) 06/29/2018   Alcohol abuse 06/27/2018   MDD (major depressive disorder), recurrent severe, without psychosis (HCC) 06/26/2018   Varicose veins of left lower extremity with complications 08/15/2017   Benign paroxysmal positional vertigo 11/08/2013   RBBB 06/02/2009   Congenital anomaly of heart 06/02/2009   Non-insulin  dependent type 2 diabetes mellitus (HCC) 04/11/2009   Substance abuse (HCC) 04/11/2009   Affective bipolar disorder (HCC) 04/11/2009   Primary hypertension 04/11/2009   Diverticulosis of colon 04/11/2009   CHOLECYSTITIS 04/11/2009   Chest pain of uncertain etiology 04/11/2009   Abdominal pain 04/11/2009   PCP:  Dwight Trula SQUIBB, MD Pharmacy:   [facility] [address] [place]), [place] - [address] [place]) [place] [address] Phone: [phone] Fax: [phone]  [facility] [address] [place] [address] Phone: [phone] Fax: [phone]  [id number] [place], [place] - [address] AT [facility] OF [address] [place] [address] Phone: [phone] Fax: [phone]  [id number] - [place], [place] - [address] AT [facility] OF [address] [facility] [address]  [place] [address] Phone: [phone] Fax: [phone]     Social Drivers of Health (SDOH) Social History: SDOH Screenings   Food Insecurity: Food  Insecurity Present (10/11/2023)  Housing: High Risk (10/11/2023)  Transportation Needs: Unmet Transportation Needs (10/11/2023)  Utilities: Not At Risk (10/11/2023)  Alcohol Screen: Low Risk  (04/20/2023)  Financial Resource Strain: Low Risk  (10/27/2022)   Received from [facility]  Physical Activity: Inactive (10/27/2022)   Received from [facility]  Social Connections: Socially Isolated (10/11/2023)  Stress: Stress Concern Present (10/27/2022)   Received from [facility]  Tobacco Use: High Risk (01/12/2024)  Health Literacy: Low Risk  (10/27/2022)   Received from [facility]   SDOH Interventions:     Readmission Risk Interventions    10/11/2023    3:09 PM 09/02/2021    1:12 PM  Readmission Risk Prevention Plan  Transportation Screening Complete Complete  PCP or Specialist Appt within 3-5 Days Complete   HRI or Home Care Consult Complete Complete  Social Work Consult for Recovery Care Planning/Counseling Complete Complete  Palliative Care Screening Complete Not Applicable  Medication Review Oceanographer) Complete

## 2024-04-05 NOTE — Consult Note (Signed)
 [facility] Psychiatric Consult Initial  Patient Name: .Todd [name].  MRN: [id number]  DOB: [birth year]  Consult Order details:  Orders (From admission, onward)     Start     Ordered   04/05/24 1052  IP CONSULT TO PSYCHIATRY       Comments: When asked if he was trying to hurt himself with trazodone , he said more or less, I didn't want to face the day  Ordering Provider: Perri DELENA Meliton [name]., MD  Provider:  (Not yet assigned)  Question Answer Comment  Location [facility]   Reason for Consult? suicidal ideation, taking 7-8 trazodone  a day      04/05/24 1052          Mode of Visit: In person   Psychiatry Consult Evaluation  Service Date: April 05, 2024 LOS:  LOS: 1 day  Chief Complaint:   Primary Psychiatric Diagnoses  Bipolar disorder, current episode mixed 2.  Ethanol use disorder 3.  Polysubstance use disorder (cocaine - in remission),   Assessment  Todd [name]. is a 68 y.o. male admitted: Presented to the ED for 04/04/2024  1:30 PM for testicular pain. He carries the psychiatric diagnoses of bipolar disorder, polysubstance use disorder (alcohol,  and has a past medical history of HTN, ulcerative esophagitis,  prostate cancer, BPPV, pulmonary embolus, heroin overdose, other opioid overdose, varicose veins, pulmonary nodule, SVT, RBBB, paroxysmal atrial flutter, congenital heart abnormality (unspecified),  His current presentation of persistently low mood, anergy, isolation, irritability, insomnia, increased substance use, with suicidal ideations and increasingly risky-medication use (trazodone  7+ pills nightly) is most consistent with bipolar disorder, current episode mixed. He meets criteria for bipolar disorder based on patient history, chart review.  Current outpatient psychotropic medications include sertraline  and trazodone  and historically he has had a mixed response to these medications. He was partially compliant with medications prior to  admission as evidenced by patient report. On initial examination, patient shared that he is not currently suicidal, but has had many of those thoughts over the last few weeks in the period of his worsening insomnia and depressive symptoms, leading him to take more and more alcohol and trazodone . Please see plan below for detailed recommendations.   Diagnoses:  Active Hospital problems: Principal Problem:   Hyponatremia Active Problems:   Non-insulin  dependent type 2 diabetes mellitus (HCC)   Affective bipolar disorder (HCC)   Primary hypertension   Alcohol abuse   Ulcerative esophagitis   Atrial flutter (HCC)   Acute bilateral knee pain   History of prostate cancer   Testicular pain, left    Plan   ## Psychiatric Medication Recommendations:  -- Continue patient sertraline  at 200 mg daily for depression  -- Start abilify  5 mg for bipolar depression, plan to increase to 10 mg when tolerated -- Start metformin  250 mg BID, plan to increase to 500 mg BID when tolerated for antipsychotic-associated weight gain -- Start folic acid  1 mg daily due to previous deficiency, poor po intake  ## Medical Decision Making Capacity: Not specifically addressed in this encounter  ## Further Work-up:  -- EKG TSH, B12, folate, EKG, or While pt on Qtc prolonging medications, please monitor & replete K+ to 4 and Mg2+ to 2 -- most recent EKG on 5/29 had QtC of 473 -- Pertinent labwork reviewed earlier this admission includes: cbc, bmp, A1c   ## Disposition:-- Plan Post Discharge/Psychiatric Care Follow-up resources will request psychiatric follow-up care with [facility] or his social worker  ## Behavioral /  Environmental: - No specific recommendations at this time.     ## Safety and Observation Level:  - Based on my clinical evaluation, I estimate the patient to be at low risk of self harm in the current setting. - At this time, we recommend  routine. This decision is based on my review of the chart  including patient's history and current presentation, interview of the patient, mental status examination, and consideration of suicide risk including evaluating suicidal ideation, plan, intent, suicidal or self-harm behaviors, risk factors, and protective factors. This judgment is based on our ability to directly address suicide risk, implement suicide prevention strategies, and develop a safety plan while the patient is in the clinical setting. Please contact our team if there is a concern that risk level has changed.  CSSR Risk Category:C-SSRS RISK CATEGORY: High Risk  Suicide Risk Assessment: Patient has following modifiable risk factors for suicide: under treated depression , social isolation, recklessness, medication noncompliance, active mental illness (to encompass adhd, tbi, mania, psychosis, trauma reaction), current symptoms: anxiety/panic, insomnia, impulsivity, anhedonia, hopelessness, and pain, medical illness (ie new dx of cancer), which we are addressing by starting a mood stabilizer, connecting him with outpatient psychiatric resources..  Patient has following non-modifiable or demographic risk factors for suicide: male gender, separation or divorce, history of suicide attempt, history of self harm behavior, and psychiatric hospitalization Patient has the following protective factors against suicide: Cultural, spiritual, or religious beliefs that discourage suicide  Thank you for this consult request. Recommendations have been communicated to the primary team.  We will follow at this time.   Lynwood Morene Lavone Delsie, MD       History of Present Illness  Relevant Aspects of [facility] Course:  Admitted on 04/04/2024 for knee pain, testicular pain, sore throat. They were found to have significant hyponatremia.   Patient Report:  Met patient early afternoon bedside, he was pleasant and agreeable to interview. He reports 6 weeks of worsening depressive mood symptoms  including feelings of guilt, worthlessness, passive suicidal ideation, rumination on past mistakes, with low energy, anhedonia, and increased use of substances (alcohol). He also reports a concerning pattern of severe insomnia with approximately one month of reduced sleep (2-3 hours per night) despite larger and larger doses of trazodone . He reports passive suicidal ideation over the last month due to his worsening overall health, his social situation (isolated from friends, family, community of faith), and his living situation.  He reports that he was taking 7-8 pills of trazodone  300 mg in order to get some rest, but he was aware that dosage like this might be dangerous. He denies current SI, HI, AVH.  Psych ROS:  Depression: guilt, worthlessness, passive suicidal ideation, rumination on past mistakes, with low energy, anhedonia, and increased use of substances (alcohol) Anxiety:  denies Mania (lifetime and current): denies grandiosity, pressured speech, endorses irritability, poor concentration, severe reductions in sleep. Psychosis: (lifetime and current): denies current, not seen responding to internal stimuli  Collateral information:  Contacted Nat Chang (case worker) at [phone] on 04/05/2024, 1300, left VM  Collateral information:  Contacted Tadan Shill (spouse, separated, [phone]) on 04/05/2024, 1310 -1325. She has had a stroke and has some difficulty making herself understood on the phone.  Married for 10 years separated for 5 years. She lives in [facility], talks with patient 2-3 times per week. They have remained very close. She is concerned about the patient. Last saw him in person around Father's day when she stayed for several  weeks.  Patient has been grieving due to the death of his father on 03-17-2024, sister, and a friend. Medical situation - she went with him to several appointments, she saw pt with his PCP and orthopedist.  No history of AVH. Strong history of insomnia.  History of chronic pain. 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 History of involvement in AA, but none currently.  Patient was sober, but fell off the wagon and has had drinking problems and substance use problems for many years. Denies statements of self-harm, denies firearms, does endorse many pills at home.    Review of Systems  Cardiovascular:  Positive for orthopnea.  Musculoskeletal:  Positive for back pain, falls and joint pain.  Psychiatric/Behavioral:  Positive for depression, memory loss, substance abuse and suicidal ideas. The patient has insomnia.      Psychiatric and Social History  Psychiatric History:  Information collected from chart review, patient  Prev Dx/Sx: Bipolar disorder, alcohol use disorder, opioid use disorder, stimulant use disorder (cocaine) Current Psych Provider: None Home Meds (current): sertraline , does not remember other medications Previous Med Trials: depakote , sertraline150 mg, mirtazapine, olanzapine, sertraline , trazodone ,  Therapy: none current  Prior Psych Hospitalization: Last hospitalization 04/21/2023  Prior Self Harm: yes. Frequent history of suicidal ideation with some plans. Prior Violence: denies, spouse denies  Family Psych History:  Family Hx suicide: denies  Social History:  Developmental Hx: Grew up in [place] , has been in [place] for 30 years Educational Hx: finished high school Occupational Hx: former Energy manager, former Academic librarian. Legal Hx: denies Living Situation: lives in subsidized housing Spiritual Hx: christian, baptist. Access to weapons/lethal means: denies firearms access. Has access to drugs of abuse    Substance use history: Alcohol: daily Type of alcohol: beer Last Drink: days before admission Number of drinks per day: not sure, more than a few History of alcohol withdrawal seizures: denies History of DT's:  denies Tobacco: 5.4 pack-year smoking history.  Illicit drugs:  Marijuana/THC: past Cocaine: yes, past Methamphetamines: no Benzodiazepines: no Opioids: yes, prescribed oxycodone  for pay Hallucinogens: no Bath salts: no Prescription Drug abuse problems:  Exam Findings  Physical Exam:  Vital Signs:  Temp:  [98 F (36.7 C)-98.8 F (37.1 C)] 98 F (36.7 C) (08/21 0735) Pulse Rate:  [70-103] 81 (08/21 0735) Resp:  [13-18] 17 (08/21 0735) BP: (117-156)/(64-125) 121/84 (08/21 0735) SpO2:  [92 %-98 %] 92 % (08/21 0735) Weight:  [75.8 kg] 75.8 kg (08/20 1334) Blood pressure 121/84, pulse 81, temperature 98 F (36.7 C), temperature source Oral, resp. rate 17, height 5' 8 (1.727 m), weight 75.8 kg, SpO2 92%. Body mass index is 25.41 kg/m.  Physical Exam Vitals reviewed.  Constitutional:      Appearance: He is obese. He is ill-appearing.  HENT:     Head: Normocephalic and atraumatic.  Pulmonary:     Breath sounds: Rhonchi present.  Skin:    General: Skin is warm.  Neurological:     Mental Status: He is oriented to person, place, and time.     Mental Status Exam: General Appearance: Casual and Disheveled  Orientation:  Full (Time, Place, and Person)  Memory:  Immediate;   Fair Recent;   Fair Remote;   Good  Concentration:  Concentration: Fair  Recall:  Fair  Attention  Fair  Eye Contact:  Good  Speech:  interrupted by wet coughing, circumstantial, rapid speech  Language:  Fair  Volume:  Decreased  Mood: pretty down  Affect:  Depressed  Thought Process:  Descriptions of Associations: Circumstantial  Thought Content:  WDL  Suicidal Thoughts:  Yes.  without intent/plan  Homicidal Thoughts:  No  Judgement:  Fair  Insight:  Fair  Psychomotor Activity:  Normal  Akathisia:  No  Fund of Knowledge:  Poor      Assets:  Desire for Improvement Housing Vocational/Educational  Cognition:  WNL  ADL's:  Intact  AIMS (if indicated):        Other History   These  have been pulled in through the EMR, reviewed, and updated if appropriate.  Family History:  The patient's family history includes Cancer in his father.  Medical History: Past Medical History:  Diagnosis Date   Arthritis    fingers (07/14/2018)   Depression    DVT (deep venous thrombosis) (HCC) LLE   Hepatitis C     finished harvoni tx ~ 2017   Hypercholesterolemia    Hypertension    Prostate cancer (HCC) 66yrs ago   Pulmonary embolism and infarction (HCC) 07/13/2018   Sleep apnea    not currently using cpap, mask causing vertigo   Type II diabetes mellitus (HCC)     Surgical History: Past Surgical History:  Procedure Laterality Date   BIOPSY  01/12/2019   Procedure: BIOPSY;  Surgeon: Donnald Charleston, MD;  Location: [facility] ENDOSCOPY;  Service: Endoscopy;;   BIOPSY  06/23/2019   Procedure: BIOPSY;  Surgeon: Burnette Fallow, MD;  Location: [facility] ENDOSCOPY;  Service: Endoscopy;;   COLONOSCOPY WITH PROPOFOL  N/A 02/19/2014   Procedure: COLONOSCOPY WITH PROPOFOL ;  Surgeon: Gladis MARLA Louder, MD;  Location: [facility] ENDOSCOPY;  Service: Endoscopy;  Laterality: N/A;   ENDOVENOUS ABLATION SAPHENOUS VEIN W/ LASER Left 11/22/2017   endovenous laser ablation L SSV by Lynwood Collum MD    ESOPHAGEAL BRUSHING  06/23/2019   Procedure: ESOPHAGEAL BRUSHING;  Surgeon: Burnette Fallow, MD;  Location: [facility] ENDOSCOPY;  Service: Endoscopy;;   ESOPHAGOGASTRODUODENOSCOPY (EGD) WITH PROPOFOL  N/A 01/12/2019   Procedure: ESOPHAGOGASTRODUODENOSCOPY (EGD) WITH PROPOFOL ;  Surgeon: Donnald Charleston, MD;  Location[facility] ENDOSCOPY;  Service: Endoscopy;  Laterality: N/A;  Patient is also scheduled for barium swallow; please notify radiology after patient's EGD is complete so that barium swallow follows the endoscopy, not vice versa   ESOPHAGOGASTRODUODENOSCOPY (EGD) WITH PROPOFOL  N/A 06/23/2019   Procedure: ESOPHAGOGASTRODUODENOSCOPY (EGD) WITH PROPOFOL ;  Surgeon: Burnette Fallow, MD;  Location: [facility] ENDOSCOPY;  Service: Endoscopy;   Laterality: N/A;   PROSTATECTOMY  2008   REPAIR QUADRICEPS / HAMSTRING MUSCLE Right      Medications:   Current Facility-Administered Medications:    acetaminophen  (TYLENOL ) tablet 650 mg, 650 mg, Oral, Q6H PRN, 650 mg at 04/04/24 2228 **OR** acetaminophen  (TYLENOL ) suppository 650 mg, 650 mg, Rectal, Q6H PRN, Fredirick Glenys RAMAN, MD   albuterol  (PROVENTIL ) (2.5 MG/3ML) 0.083% nebulizer solution 2.5 mg, 2.5 mg, Nebulization, Q2H PRN, Fredirick Glenys RAMAN, MD   cyanocobalamin  (VITAMIN B12) tablet 500-1,000 mcg, 500-1,000 mcg, Oral, Daily, Fredirick Glenys RAMAN, MD, 1,000 mcg at 04/05/24 1042   enoxaparin  (LOVENOX ) injection 40 mg, 40 mg, Subcutaneous, Q24H, Fredirick Glenys RAMAN, MD, 40 mg at 04/05/24 1042   hydrOXYzine  (ATARAX ) tablet 25 mg, 25 mg, Oral, TID PRN, Fredirick Glenys RAMAN, MD, 25 mg at 04/04/24 2227   LORazepam  (ATIVAN ) injection 0-4 mg, 0-4 mg, Intravenous, Q6H **OR** LORazepam  (ATIVAN ) tablet 0-4 mg, 0-4 mg, Oral, Q6H, Fredirick Glenys RAMAN, MD   [START ON 04/07/2024] LORazepam  (ATIVAN ) injection  0-4 mg, 0-4 mg, Intravenous, Q12H **OR** [START ON 04/07/2024] LORazepam  (ATIVAN ) tablet 0-4 mg, 0-4 mg, Oral, Q12H, Pfeiffer, Marcy, MD   melatonin tablet 3 mg, 3 mg, Oral, QHS, Pratt, Tanya S, MD, 3 mg at 04/04/24 2227   metoprolol  tartrate (LOPRESSOR ) injection 5 mg, 5 mg, Intravenous, Q6H PRN, Fredirick Glenys RAMAN, MD   nicotine  (NICODERM CQ  - dosed in mg/24 hours) patch 14 mg, 14 mg, Transdermal, Daily, Fredirick Glenys RAMAN, MD, 14 mg at 04/05/24 1048   ondansetron  (ZOFRAN ) tablet 4 mg, 4 mg, Oral, Q6H PRN **OR** ondansetron  (ZOFRAN ) injection 4 mg, 4 mg, Intravenous, Q6H PRN, Fredirick Glenys RAMAN, MD   oxyCODONE  (Oxy IR/ROXICODONE ) immediate release tablet 5 mg, 5 mg, Oral, Q4H PRN, Chavez, Abigail, NP, 5 mg at 04/05/24 1046   pantoprazole  (PROTONIX ) EC tablet 40 mg, 40 mg, Oral, BID, Pratt, Tanya S, MD, 40 mg at 04/05/24 1043   polyethylene glycol (MIRALAX  / GLYCOLAX ) packet 17 g, 17 g, Oral, Daily PRN, Fredirick Glenys RAMAN, MD   sertraline   (ZOLOFT ) tablet 200 mg, 200 mg, Oral, Daily, Fredirick Glenys RAMAN, MD, 200 mg at 04/05/24 1042   thiamine  (VITAMIN B1) tablet 100 mg, 100 mg, Oral, Daily, 100 mg at 04/05/24 1042 **OR** thiamine  (VITAMIN B1) injection 100 mg, 100 mg, Intravenous, Daily, Fredirick Glenys RAMAN, MD  Allergies: No Known Allergies  Lynwood Morene Lavone Delsie, MD

## 2024-04-05 NOTE — Progress Notes (Signed)
 PROGRESS NOTE    Todd [name].  FMW:[id number] DOB: 1956-02-18 DOA: 04/04/2024 PCP: Todd [name] SQUIBB, MD  Chief Complaint  Patient presents with   Knee Pain   Testicle Pain   Sore Throat    Brief Narrative:   Todd [name]. is Todd [name] 68 y.o. male with medical history significant of paroxysmal [name] flutter, HTN, CHFpEF, polysubstance use, who admits to drinking at least 1 beer daily and reports being on chronic pain meds daily who presents to the ED with testicular pain on the left.  He notes the pains been there approximately 2 months.  He also reports bilateral knee pain.  He has some epigastric pain and history of esophageal ulcers though he denies hematemesis dark stools.  In the ED he had Wai Minotti negative scrotal Doppler and they noted no significant knee swelling.  Labs were significant for hyponatremia with sodium of 121.  We were asked to admit for hyponatremia.  Per patient he has had very limited food over the last 3 days.  He states the place he is living is not feeding him.   Assessment & Plan:   Principal Problem:   Hyponatremia Active Problems:   Non-insulin  dependent type 2 diabetes mellitus (HCC)   Affective bipolar disorder (HCC)   Primary hypertension   Alcohol abuse   Ulcerative esophagitis   Atrial flutter (HCC)   Acute bilateral knee pain   History of prostate cancer   Testicular pain, left  Hyponatremia Suspect multifactorial.  History suggest poor PO intake and suspected hypovolemia.  Currently appears euvolemic.  Also, is on psychiatric meds that could cause SIADH (zoloft , trazodone  - he was taking trazodone  inappropriately).   TSH wnl.  Cortisol pending. Urine Na, urine osm  Will hold IVF for now and trend his sodium - improved to 126 CXR 8/21 showed congestion with diffuse interstitial opacity suggesting edema  Strict I/O, daily weights   Left Testicular and Left Leg Pain Describes on and off pain for [name] year, no pain in back, but occasionally describes  numbness to buttocks or scrotum.   CT abd/pelvis without explanation  Scrotal US  without acute abnormality - small R and trace L hydroceles Given description of pain radiating down leg/numbness, will get MRI lumbar spine    Dysphagia SLP eval Esophagram   Acute bilateral knee pain Chronic Pain Add gabapentin  to regimen.  Scheduled APAP.  Voltaren . Continue oxycodone  (on chronic norco at home)  Reports chronic oxycodone  use Will continue this   Alcohol abuse CIWA protocol Patient denies any history of DTs or seizures with not drinking.  HFpEF CXR concerning for edema Echo with EF 55-60% Clinically, doesn't appear overloaded - no crackles on exam, no edema Will monitor for now  Strict I/O, daily weights  Atrial flutter (HCC) Continue beta-blocker   Ulcerative esophagitis PPI   History of prostate cancer Status post prostatectomy  Primary hypertension Continue diltiazem  Lopressor  as needed   Affective bipolar disorder (HCC) Currently on no meds   Non-insulin  dependent type 2 diabetes mellitus (HCC) Currently on no meds Carb modified diet Check A1c CBGs    DVT prophylaxis: lovenox  Code Status: ful Family Communication: none Disposition:   Status is: Inpatient Remains inpatient appropriate because: need for continued inpatient care   Consultants:  psych  Procedures:  none  Antimicrobials:  Anti-infectives (From admission, onward)    None       Subjective: C/o knee pain Issues swallowing (chronic) L testicular pain (months) - radiating into L leg  Objective: Vitals:   04/05/24 0003 04/05/24 0032 04/05/24 0444 04/05/24 0735  BP: (!) 156/125 (!) 135/93 117/86 121/84  Pulse: 84 84 92 81  Resp: 16  17 17   Temp: 98 F (36.7 C)  98.1 F (36.7 C) 98 F (36.7 C)  TempSrc:    Oral  SpO2: 96%  93% 92%  Weight:      Height:        Intake/Output Summary (Last 24 hours) at 04/05/2024 1301 Last data filed at 04/05/2024 0425 Gross per 24 hour   Intake --  Output 400 ml  Net -400 ml   Filed Weights   04/04/24 1334  Weight: 75.8 kg    Examination:  General exam: Appears calm and comfortable  Respiratory system: Clear to auscultation. Respiratory effort normal. Cardiovascular system: RRR Gastrointestinal system: Abdomen is nondistended, soft and nontender.  Central nervous system: Alert and oriented. Negative straight leg raise bilaterally (though he was resisting due to knee discomfort I think) Extremities: no LEE    Data Reviewed: I have personally reviewed following labs and imaging studies  CBC: Recent Labs  Lab 04/04/24 1405 04/05/24 0455  WBC 10.6* 8.6  NEUTROABS 8.3*  --   HGB 16.3 13.6  HCT 47.0 39.5  MCV 91.8 91.9  PLT 335 287    Basic Metabolic Panel: Recent Labs  Lab 04/04/24 1405 04/04/24 2126 04/05/24 0455 04/05/24 0854  NA 121* 124* 123* 126*  K 4.4 5.3* 5.0 4.8  CL 84* 86* 88* 90*  CO2 21* 25 24 26   GLUCOSE 100* 98 95 95  BUN 9 11 12 12   CREATININE 1.07 1.40* 1.38* 1.19  CALCIUM  9.2 8.9 9.1 9.2    GFR: Estimated Creatinine Clearance: 57.5 mL/min (by C-G formula based on SCr of 1.19 mg/dL).  Liver Function Tests: Recent Labs  Lab 04/04/24 1405 04/05/24 0455  AST 25 20  ALT 11 10  ALKPHOS 52 37*  BILITOT 0.7 0.6  PROT 8.4* 6.9  ALBUMIN 3.5 3.0*    CBG: Recent Labs  Lab 04/04/24 2144 04/05/24 0631 04/05/24 1114  GLUCAP 120* 93 83     No results found for this or any previous visit (from the past 240 hours).       Radiology Studies: DG CHEST PORT 1 VIEW Result Date: 04/05/2024 CLINICAL DATA:  CHF. EXAM: PORTABLE CHEST 1 VIEW COMPARISON:  10/10/2023 FINDINGS: Cardiopericardial silhouette is at upper limits of normal for size. Vascular congestion with diffuse interstitial opacity suggesting edema. No focal airspace consolidation. No substantial pleural effusion. No acute bony abnormality. Telemetry leads overlie the chest. IMPRESSION: Vascular congestion with  diffuse interstitial opacity suggesting edema. Electronically Signed   By: Camellia Candle M.D.   On: 04/05/2024 07:49   CT ABDOMEN PELVIS W CONTRAST Result Date: 04/04/2024 CLINICAL DATA:  Abdominal pain EXAM: CT ABDOMEN AND PELVIS WITH CONTRAST TECHNIQUE: Multidetector CT imaging of the abdomen and pelvis was performed using the standard protocol following bolus administration of intravenous contrast. RADIATION DOSE REDUCTION: This exam was performed according to the departmental dose-optimization program which includes automated exposure control, adjustment of the mA and/or kV according to patient size and/or use of iterative reconstruction technique. CONTRAST:  75mL OMNIPAQUE  IOHEXOL  350 MG/ML SOLN COMPARISON:  08/30/2021 FINDINGS: Lower chest: Scarring in the lung bases.  No acute findings. Hepatobiliary: No focal liver abnormality is seen. Status post cholecystectomy. No biliary dilatation. Pancreas: No focal abnormality or ductal dilatation. Spleen: No focal abnormality.  Normal size. Adrenals/Urinary Tract: Right adrenal nodule is  stable most compatible with adenoma. Numerous bilateral renal cysts are stable. No follow-up imaging recommended. No stones or hydronephrosis. Urinary bladder unremarkable. Stomach/Bowel: Normal appendix. Stomach, large and small bowel grossly unremarkable. Mildly prominent mid and lower small bowel loops. Distal small bowel is decompressed. Proximal small bowel is normal caliber. Sigmoid diverticulosis. No active diverticulitis. Vascular/Lymphatic: No evidence of aneurysm or adenopathy. Reproductive: No visible focal abnormality. Other: No free fluid or free air. Musculoskeletal: No acute bony abnormality. IMPRESSION: Mildly prominent mid abdominal small bowel loops in the mid to lower abdomen. This could reflect focal ileus or early low grade small bowel obstruction. Bibasilar scarring. Sigmoid diverticulosis. Electronically Signed   By: Franky Crease M.D.   On: 04/04/2024 20:54    US  SCROTUM W/DOPPLER Result Date: 04/04/2024 CLINICAL DATA:  [id number] Scrotal pain [id number] EXAM: SCROTAL ULTRASOUND DOPPLER ULTRASOUND OF THE TESTICLES TECHNIQUE: Complete ultrasound examination of the testicles, epididymis, and other scrotal structures was performed. Color and spectral Doppler ultrasound were also utilized to evaluate blood flow to the testicles. COMPARISON:  Dec 24, 2009 FINDINGS: Right testicle Measurements: 2.7 x 2 x 2.5 cm. No mass or microlithiasis visualized. Striated heterogeneity of the testicular parenchyma, which may represent age-related seminiferous tubular atrophy. Left testicle Measurements: 2.9 x 2 x 2.4 cm. No mass or microlithiasis visualized. Striated heterogeneity of the testicular parenchyma, which may represent age-related seminiferous tubular atrophy. Curvilinear calcification with shadowing along the posterior testicle, possibly Cherika Jessie scrotolith. Right epididymis:  Normal in size and appearance. Left epididymis:  Normal in size and appearance. Hydrocele:  Small volume right hydrocele.  Trace left hydrocele. Varicocele:  None visualized. Pulsed Doppler interrogation of both testes demonstrates normal low resistance arterial and venous waveforms bilaterally. IMPRESSION: 1. No acute sonographic abnormality within the testicles; more specifically, no testicular mass, findings of epididymo-orchitis, or changes of testicular torsion, at this time. 2. Small right and trace left-sided hydroceles. Electronically Signed   By: Rogelia Myers M.D.   On: 04/04/2024 16:14        Scheduled Meds:  acetaminophen   1,000 mg Oral Q8H   cyanocobalamin   500-1,000 mcg Oral Daily   diclofenac  Sodium  2 g Topical QID   enoxaparin  (LOVENOX ) injection  40 mg Subcutaneous Q24H   gabapentin   100 mg Oral TID   LORazepam   0-4 mg Intravenous Q6H   Or   LORazepam   0-4 mg Oral Q6H   [START ON 04/07/2024] LORazepam   0-4 mg Intravenous Q12H   Or   [START ON 04/07/2024] LORazepam   0-4 mg Oral Q12H    melatonin  3 mg Oral QHS   nicotine   14 mg Transdermal Daily   pantoprazole   40 mg Oral BID   sertraline   200 mg Oral Daily   thiamine   100 mg Oral Daily   Or   thiamine   100 mg Intravenous Daily   Continuous Infusions:   LOS: 1 day    Time spent: over 30 min    Meliton Monte, MD [facility]   To contact the attending provider between 7A-7P or the covering provider during after hours 7P-7A, please log into the web site [email or web address] and access using universal [place] password for that web site. If you do not have the password, please call the hospital operator.  04/05/2024, 1:01 PM

## 2024-04-06 ENCOUNTER — Inpatient Hospital Stay (HOSPITAL_COMMUNITY)

## 2024-04-06 DIAGNOSIS — E871 Hypo-osmolality and hyponatremia: Secondary | ICD-10-CM | POA: Diagnosis not present

## 2024-04-06 LAB — CBC WITH DIFFERENTIAL/PLATELET
Abs Immature Granulocytes: 0.03 K/uL (ref 0.00–0.07)
Basophils Absolute: 0 K/uL (ref 0.0–0.1)
Basophils Relative: 0 %
Eosinophils Absolute: 0.1 K/uL (ref 0.0–0.5)
Eosinophils Relative: 1 %
HCT: 38.9 % — ABNORMAL LOW (ref 39.0–52.0)
Hemoglobin: 13.1 g/dL (ref 13.0–17.0)
Immature Granulocytes: 0 %
Lymphocytes Relative: 19 %
Lymphs Abs: 1.3 K/uL (ref 0.7–4.0)
MCH: 31.5 pg (ref 26.0–34.0)
MCHC: 33.7 g/dL (ref 30.0–36.0)
MCV: 93.5 fL (ref 80.0–100.0)
Monocytes Absolute: 0.7 K/uL (ref 0.1–1.0)
Monocytes Relative: 9 %
Neutro Abs: 5 K/uL (ref 1.7–7.7)
Neutrophils Relative %: 71 %
Platelets: 261 K/uL (ref 150–400)
RBC: 4.16 MIL/uL — ABNORMAL LOW (ref 4.22–5.81)
RDW: 13.2 % (ref 11.5–15.5)
WBC: 7.1 K/uL (ref 4.0–10.5)
nRBC: 0 % (ref 0.0–0.2)

## 2024-04-06 LAB — COMPREHENSIVE METABOLIC PANEL WITH GFR
ALT: 10 U/L (ref 0–44)
AST: 17 U/L (ref 15–41)
Albumin: 2.9 g/dL — ABNORMAL LOW (ref 3.5–5.0)
Alkaline Phosphatase: 38 U/L (ref 38–126)
Anion gap: 15 (ref 5–15)
BUN: 16 mg/dL (ref 8–23)
CO2: 26 mmol/L (ref 22–32)
Calcium: 9.8 mg/dL (ref 8.9–10.3)
Chloride: 99 mmol/L (ref 98–111)
Creatinine, Ser: 1.23 mg/dL (ref 0.61–1.24)
GFR, Estimated: >60 mL/min (ref >60)
Glucose, Bld: 85 mg/dL (ref 70–99)
Potassium: 4.9 mmol/L (ref 3.5–5.1)
Sodium: 130 mmol/L — ABNORMAL LOW (ref 135–145)
Total Bilirubin: 0.8 mg/dL (ref 0.0–1.2)
Total Protein: 6.9 g/dL (ref 6.5–8.1)

## 2024-04-06 LAB — LIPID PANEL
Cholesterol: 117 mg/dL (ref 0–200)
HDL: 71 mg/dL (ref >40)
LDL Cholesterol: 34 mg/dL (ref 0–99)
Total CHOL/HDL Ratio: 1.6 ratio
Triglycerides: 62 mg/dL (ref <150)
VLDL: 12 mg/dL (ref 0–40)

## 2024-04-06 LAB — GLUCOSE, CAPILLARY
Glucose-Capillary: 84 mg/dL (ref 70–99)
Glucose-Capillary: 109 mg/dL — ABNORMAL HIGH (ref 70–99)
Glucose-Capillary: 159 mg/dL — ABNORMAL HIGH (ref 70–99)
Glucose-Capillary: 111 mg/dL — ABNORMAL HIGH (ref 70–99)

## 2024-04-06 LAB — PHOSPHORUS: Phosphorus: 3 mg/dL (ref 2.5–4.6)

## 2024-04-06 LAB — MAGNESIUM: Magnesium: 1.9 mg/dL (ref 1.7–2.4)

## 2024-04-06 MED ORDER — GABAPENTIN 300 MG PO CAPS
300.0000 mg | ORAL_CAPSULE | Freq: Three times a day (TID) | ORAL | Status: DC
  Administered 2024-04-06 – 2024-04-13 (×21): 300 mg via ORAL
  Filled 2024-04-06 (×21): qty 1

## 2024-04-06 MED ORDER — METFORMIN HCL 500 MG PO TABS
500.0000 mg | ORAL_TABLET | Freq: Two times a day (BID) | ORAL | Status: DC
  Administered 2024-04-07 – 2024-04-13 (×9): 500 mg via ORAL
  Filled 2024-04-06 (×14): qty 1

## 2024-04-06 MED ORDER — COSYNTROPIN 0.25 MG IJ SOLR
0.2500 mg | Freq: Once | INTRAMUSCULAR | Status: AC
  Administered 2024-04-07: 0.25 mg via INTRAVENOUS
  Filled 2024-04-06: qty 0.25

## 2024-04-06 MED ORDER — ARIPIPRAZOLE 10 MG PO TABS
10.0000 mg | ORAL_TABLET | Freq: Every day | ORAL | Status: DC
  Administered 2024-04-07 – 2024-04-13 (×7): 10 mg via ORAL
  Filled 2024-04-06 (×7): qty 1

## 2024-04-06 NOTE — Progress Notes (Signed)
 PROGRESS NOTE    Todd [name].  FMW:[id number] DOB: Jun 28, 1956 DOA: 04/04/2024 PCP: Dwight Trula SQUIBB, MD  Chief Complaint  Patient presents with   Knee Pain   Testicle Pain   Sore Throat    Brief Narrative:   Todd Derousse. is Todd [name] 68 y.o. male with medical history significant of paroxysmal Elexia Friedt flutter, HTN, CHFpEF, polysubstance use, who admits to drinking at least 1 beer daily and reports being on chronic pain meds daily who presents to the ED with testicular pain on the left.  He notes the pains been there approximately 2 months.  He also reports bilateral knee pain.  He has some epigastric pain and history of esophageal ulcers though he denies hematemesis dark stools.  In the ED he had Todd [name] negative scrotal Doppler and they noted no significant knee swelling.  Labs were significant for hyponatremia with sodium of 121.  We were asked to admit for hyponatremia.  Per patient he has had very limited food over the last 3 days.  He states the place he is living is not feeding him.   Assessment & Plan:   Principal Problem:   Hyponatremia Active Problems:   Non-insulin  dependent type 2 diabetes mellitus (HCC)   Affective bipolar disorder (HCC)   Primary hypertension   Alcohol abuse   Ulcerative esophagitis   Atrial flutter (HCC)   Acute bilateral knee pain   History of prostate cancer   Testicular pain, left  Hyponatremia Suspect multifactorial.  History suggest poor PO intake and suspected hypovolemia.  Currently appears euvolemic.  Also, is on psychiatric meds that could cause SIADH (zoloft , trazodone  - he was taking trazodone  inappropriately).   TSH wnl.  Nondiagnostic cortisol.  ACTH stim Na is improving, will continue to monitor. CXR 8/21 showed congestion with diffuse interstitial opacity suggesting edema  Strict I/O, daily weights   Left Testicular and Left Leg Pain Describes on and off pain for Todd [name] year, no pain in back, but occasionally describes numbness to buttocks or  scrotum.   CT abd/pelvis without explanation  Scrotal US  without acute abnormality - small R and trace L hydroceles MRI without acute findings or explanation for symptoms - mild disc bulging.  No spinal stenosis or nerve root encroachment.  Asymmetric R sided facet hypertrophy at L5-S1.    Dysphagia SLP eval - dysphagia 3, thin - appreciate further SLP recs Esophagram   Acute bilateral knee pain Chronic Pain Add gabapentin  to regimen - increase as tolerated.  Scheduled APAP.  Voltaren . Continue oxycodone  (on chronic norco at home)  Plain films today Long discussion - we won't likely significantly improve his chronic pain here - consider trial of steroids? Will continue this   Alcohol abuse CIWA protocol Patient denies any history of DTs or seizures with not drinking.  HFpEF CXR concerning for edema Echo with EF 55-60% Clinically, doesn't appear overloaded - no crackles on exam, no edema Will monitor for now  Strict I/O, daily weights  Atrial flutter (HCC) Continue beta-blocker   Ulcerative esophagitis PPI   History of prostate cancer Status post prostatectomy  Primary hypertension Continue diltiazem  Lopressor  as needed   Affective bipolar disorder (HCC) Zoloft , abilify  - metformin  per psych   Hx Folate Def Folic acid   Non-insulin  dependent type 2 diabetes mellitus (HCC) Currently on no meds Carb modified diet Check A1c CBGs    DVT prophylaxis: lovenox  Code Status: ful Family Communication: none Disposition:   Status is: Inpatient Remains inpatient appropriate because: need for continued inpatient  care   Consultants:  psych  Procedures:  none  Antimicrobials:  Anti-infectives (From admission, onward)    None       Subjective: C/o continued chronic knee pain   Objective: Vitals:   04/05/24 1951 04/06/24 0041 04/06/24 0430 04/06/24 0743  BP: 132/86 121/76 125/74 125/87  Pulse: 81 67 66 78  Resp: 15  17 16   Temp: 98.6 F (37 C)  98.8  F (37.1 C) (!) 97.5 F (36.4 C)  TempSrc: Oral  Oral Rectal  SpO2: (!) 89%  94% 95%  Weight:      Height:        Intake/Output Summary (Last 24 hours) at 04/06/2024 1144 Last data filed at 04/06/2024 0850 Gross per 24 hour  Intake 480 ml  Output 1520 ml  Net -1040 ml   Filed Weights   04/04/24 1334  Weight: 75.8 kg    Examination:  General: No acute distress. Cardiovascular: RRR Lungs: unlabored S/nt/nd Neurological: Alert and oriented 3. Moves all extremities 4 with equal strength. Cranial nerves II through XII grossly intact. Extremities: No clubbing or cyanosis. No edema.   Data Reviewed: I have personally reviewed following labs and imaging studies  CBC: Recent Labs  Lab 04/04/24 1405 04/05/24 0455 04/06/24 0413  WBC 10.6* 8.6 7.1  NEUTROABS 8.3*  --  5.0  HGB 16.3 13.6 13.1  HCT 47.0 39.5 38.9*  MCV 91.8 91.9 93.5  PLT 335 287 261    Basic Metabolic Panel: Recent Labs  Lab 04/04/24 2126 04/05/24 0455 04/05/24 0854 04/05/24 1656 04/06/24 0413  NA 124* 123* 126* 127* 130*  K 5.3* 5.0 4.8 4.9 4.9  CL 86* 88* 90* 88* 99  CO2 25 24 26 25 26   GLUCOSE 98 95 95 104* 85  BUN 11 12 12 14 16   CREATININE 1.40* 1.38* 1.19 1.41* 1.23  CALCIUM  8.9 9.1 9.2 9.5 9.8  MG  --   --   --   --  1.9  PHOS  --   --   --   --  3.0    GFR: Estimated Creatinine Clearance: 55.6 mL/min (by C-G formula based on SCr of 1.23 mg/dL).  Liver Function Tests: Recent Labs  Lab 04/04/24 1405 04/05/24 0455 04/06/24 0413  AST 25 20 17   ALT 11 10 10   ALKPHOS 52 37* 38  BILITOT 0.7 0.6 0.8  PROT 8.4* 6.9 6.9  ALBUMIN 3.5 3.0* 2.9*    CBG: Recent Labs  Lab 04/04/24 2144 04/05/24 0631 04/05/24 1114 04/05/24 1629 04/06/24 0611  GLUCAP 120* 93 83 101* 84     No results found for this or any previous visit (from the past 240 hours).       Radiology Studies: MR LUMBAR SPINE WO CONTRAST Result Date: 04/05/2024 CLINICAL DATA:  Lumbar radiculopathy, symptoms  persist with > 6 wks treatment Patient reports chronic pain medications with left testicular pain for approximately 2 months. EXAM: MRI LUMBAR SPINE WITHOUT CONTRAST TECHNIQUE: Multiplanar, multisequence MR imaging of the lumbar spine was performed. No intravenous contrast was administered. COMPARISON:  Abdominopelvic CT 04/04/2024. Chest CT 09/08/2018. Lumbar spine radiographs 12/03/2009. FINDINGS: Segmentation: Transitional lumbosacral anatomy. As correlated with prior CTs, there are 12 rib-bearing thoracic type vertebral bodies, 4 lumbar type vertebral bodies and Marquesha Robideau largely sacralized transitional L5 segment. Alignment:  Physiologic. Vertebrae: No worrisome osseous lesion, acute fracture or pars defect. Prominent hemangioma within the L3 vertebral body. Partially ankylosed paraspinal osteophytes in the lower thoracic spine. Mild sacroiliac degenerative changes bilaterally.  Conus medullaris: Extends to the T12-L1 level. The conus and cauda equina appear normal. Paraspinal and other soft tissues: No significant paraspinal findings. Simple appearing bilateral renal cysts for which no specific follow-up imaging is recommended. Stable 1.9 cm right adrenal nodule reported as [place]ella probable adenoma on prior CT, stable from 2020 chest CT. Disc levels: Sagittal images demonstrate paraspinal osteophytes in the lower thoracic spine but no evidence of spinal stenosis or significant foraminal narrowing. L1-2: Normal interspace. L2-3: Mild disc desiccation and bulging with preserved disc height. Mild facet hypertrophy. No spinal stenosis or foraminal narrowing. L3-4: Mild disc desiccation and bulging with preserved disc height. Mild to moderate facet and ligamentous hypertrophy. No spinal stenosis or foraminal narrowing. L4-5: Preserved disc height with mild disc bulging and moderate to severe asymmetric right facet hypertrophy. No spinal stenosis or foraminal narrowing. L5-S1: As numbered, this is Tkai Large vestigial disc space without  acquired abnormality. IMPRESSION: 1. Transitional lumbosacral anatomy. There are 4 lumbar type vertebral bodies and Granvil Djordjevic largely sacralized transitional L5 segment. 2. No acute findings or explanation for the patient's symptoms. 3. Mild disc bulging as described. No spinal stenosis or nerve root encroachment. Asymmetric right-sided facet hypertrophy at L5-S1. 4. Stable right adrenal nodule, reported as Dasean Brow probable adenoma on prior CT. Electronically Signed   By: Todd [name] M.D.   On: 04/05/2024 14:52   DG CHEST PORT 1 VIEW Result Date: 04/05/2024 CLINICAL DATA:  CHF. EXAM: PORTABLE CHEST 1 VIEW COMPARISON:  10/10/2023 FINDINGS: Cardiopericardial silhouette is at upper limits of normal for size. Vascular congestion with diffuse interstitial opacity suggesting edema. No focal airspace consolidation. No substantial pleural effusion. No acute bony abnormality. Telemetry leads overlie the chest. IMPRESSION: Vascular congestion with diffuse interstitial opacity suggesting edema. Electronically Signed   By: Camellia Candle M.D.   On: 04/05/2024 07:49   CT ABDOMEN PELVIS W CONTRAST Result Date: 04/04/2024 CLINICAL DATA:  Abdominal pain EXAM: CT ABDOMEN AND PELVIS WITH CONTRAST TECHNIQUE: Multidetector CT imaging of the abdomen and pelvis was performed using the standard protocol following bolus administration of intravenous contrast. RADIATION DOSE REDUCTION: This exam was performed according to the departmental dose-optimization program which includes automated exposure control, adjustment of the mA and/or kV according to patient size and/or use of iterative reconstruction technique. CONTRAST:  75mL OMNIPAQUE  IOHEXOL  350 MG/ML SOLN COMPARISON:  08/30/2021 FINDINGS: Lower chest: Scarring in the lung bases.  No acute findings. Hepatobiliary: No focal liver abnormality is seen. Status post cholecystectomy. No biliary dilatation. Pancreas: No focal abnormality or ductal dilatation. Spleen: No focal abnormality.  Normal  size. Adrenals/Urinary Tract: Right adrenal nodule is stable most compatible with adenoma. Numerous bilateral renal cysts are stable. No follow-up imaging recommended. No stones or hydronephrosis. Urinary bladder unremarkable. Stomach/Bowel: Normal appendix. Stomach, large and small bowel grossly unremarkable. Mildly prominent mid and lower small bowel loops. Distal small bowel is decompressed. Proximal small bowel is normal caliber. Sigmoid diverticulosis. No active diverticulitis. Vascular/Lymphatic: No evidence of aneurysm or adenopathy. Reproductive: No visible focal abnormality. Other: No free fluid or free air. Musculoskeletal: No acute bony abnormality. IMPRESSION: Mildly prominent mid abdominal small bowel loops in the mid to lower abdomen. This could reflect focal ileus or early low grade small bowel obstruction. Bibasilar scarring. Sigmoid diverticulosis. Electronically Signed   By: Franky Crease M.D.   On: 04/04/2024 20:54   US  SCROTUM W/DOPPLER Result Date: 04/04/2024 CLINICAL DATA:  203464 Scrotal pain [id number] EXAM: SCROTAL ULTRASOUND DOPPLER ULTRASOUND OF THE TESTICLES TECHNIQUE: Complete ultrasound examination of the  testicles, epididymis, and other scrotal structures was performed. Color and spectral Doppler ultrasound were also utilized to evaluate blood flow to the testicles. COMPARISON:  Dec 24, 2009 FINDINGS: Right testicle Measurements: 2.7 x 2 x 2.5 cm. No mass or microlithiasis visualized. Striated heterogeneity of the testicular parenchyma, which may represent age-related seminiferous tubular atrophy. Left testicle Measurements: 2.9 x 2 x 2.4 cm. No mass or microlithiasis visualized. Striated heterogeneity of the testicular parenchyma, which may represent age-related seminiferous tubular atrophy. Curvilinear calcification with shadowing along the posterior testicle, possibly Teshia Mahone scrotolith. Right epididymis:  Normal in size and appearance. Left epididymis:  Normal in size and appearance.  Hydrocele:  Small volume right hydrocele.  Trace left hydrocele. Varicocele:  None visualized. Pulsed Doppler interrogation of both testes demonstrates normal low resistance arterial and venous waveforms bilaterally. IMPRESSION: 1. No acute sonographic abnormality within the testicles; more specifically, no testicular mass, findings of epididymo-orchitis, or changes of testicular torsion, at this time. 2. Small right and trace left-sided hydroceles. Electronically Signed   By: Rogelia Myers M.D.   On: 04/04/2024 16:14        Scheduled Meds:  acetaminophen   1,000 mg Oral Q8H   [START ON 04/07/2024] ARIPiprazole   10 mg Oral Daily   [START ON 04/07/2024] cosyntropin   0.25 mg Intravenous Once   cyanocobalamin   500-1,000 mcg Oral Daily   diclofenac  Sodium  2 g Topical QID   enoxaparin  (LOVENOX ) injection  40 mg Subcutaneous Q24H   folic acid   1 mg Oral Daily   gabapentin   300 mg Oral TID   LORazepam   0-4 mg Intravenous Q6H   Or   LORazepam   0-4 mg Oral Q6H   [START ON 04/07/2024] LORazepam   0-4 mg Intravenous Q12H   Or   [START ON 04/07/2024] LORazepam   0-4 mg Oral Q12H   melatonin  3 mg Oral QHS   metFORMIN   500 mg Oral BID WC   nicotine   14 mg Transdermal Daily   pantoprazole   40 mg Oral BID   sertraline   200 mg Oral Daily   thiamine   100 mg Oral Daily   Or   thiamine   100 mg Intravenous Daily   Continuous Infusions:   LOS: 2 days    Time spent: over 30 min    Meliton Monte, MD [facility]   To contact the attending provider between 7A-7P or the covering provider during after hours 7P-7A, please log into the web site [email or web address] and access using universal [place] password for that web site. If you do not have the password, please call the hospital operator.  04/06/2024, 11:44 AM

## 2024-04-06 NOTE — Evaluation (Signed)
 Clinical/Bedside Swallow Evaluation Patient Details  Name: Todd [name]. MRN: [id number] Date of Birth: 18-Apr-1956  Today's Date: 04/06/2024 Time: SLP Start Time (ACUTE ONLY): 9062 SLP Stop Time (ACUTE ONLY): 0946 SLP Time Calculation (min) (ACUTE ONLY): 9 min  Past Medical History:  Past Medical History:  Diagnosis Date   Arthritis    fingers (07/14/2018)   Depression    DVT (deep venous thrombosis) (HCC) LLE   Hepatitis C     finished [facility] tx ~ 2017   Hypercholesterolemia    Hypertension    Prostate cancer (HCC) 43yrs ago   Pulmonary embolism and infarction (HCC) 07/13/2018   Sleep apnea    not currently using cpap, mask causing vertigo   Type II diabetes mellitus (HCC)    Past Surgical History:  Past Surgical History:  Procedure Laterality Date   BIOPSY  01/12/2019   Procedure: BIOPSY;  Surgeon: Donnald Charleston, MD;  Location: [facility] ENDOSCOPY;  Service: Endoscopy;;   BIOPSY  06/23/2019   Procedure: BIOPSY;  Surgeon: Burnette Fallow, MD;  Location: [facility] ENDOSCOPY;  Service: Endoscopy;;   COLONOSCOPY WITH PROPOFOL  N/A 02/19/2014   Procedure: COLONOSCOPY WITH PROPOFOL ;  Surgeon: Gladis MARLA Louder, MD;  Location: [facility] ENDOSCOPY;  Service: Endoscopy;  Laterality: N/A;   ENDOVENOUS ABLATION SAPHENOUS VEIN W/ LASER Left 11/22/2017   endovenous laser ablation L SSV by Lynwood Collum MD    ESOPHAGEAL BRUSHING  06/23/2019   Procedure: ESOPHAGEAL BRUSHING;  Surgeon: Burnette Fallow, MD;  Location: [facility] ENDOSCOPY;  Service: Endoscopy;;   ESOPHAGOGASTRODUODENOSCOPY (EGD) WITH PROPOFOL  N/A 01/12/2019   Procedure: ESOPHAGOGASTRODUODENOSCOPY (EGD) WITH PROPOFOL ;  Surgeon: Donnald Charleston, MD;  Location: [facility] ENDOSCOPY;  Service: Endoscopy;  Laterality: N/A;  Patient is also scheduled for barium swallow; please notify radiology after patient's EGD is complete so that barium swallow follows the endoscopy, not vice versa   ESOPHAGOGASTRODUODENOSCOPY (EGD) WITH PROPOFOL  N/A 06/23/2019   Procedure:  ESOPHAGOGASTRODUODENOSCOPY (EGD) WITH PROPOFOL ;  Surgeon: Burnette Fallow, MD;  Location: [place] [facility] ENDOSCOPY;  Service: Endoscopy;  Laterality: N/A;   PROSTATECTOMY  2008   REPAIR QUADRICEPS / HAMSTRING MUSCLE Right    HPI:  Todd [name]. is a 68 y.o. male who presented to the ED with testicular pain on the left.  He notes the pains been there approximately 2 months.  He also reports bilateral knee pain.  He has some epigastric pain and history of esophageal ulcers though he denies hematemesis dark stools.  CXR 8/21: Vascular congestion with diffuse interstitial opacity suggesting edema. Pt with esophagram in February of this year with statsis of tablet.  Seen by speech 10/11/23 with recs for D3/thin. Pt with with medical history significant of paroxysmal a flutter, HTN, CHFpEF, polysubstance use, who admits to drinking at least 1 beer daily and reports being on chronic pain meds daily.    Assessment / Plan / Recommendation  Clinical Impression  Pt presents with clinical indicators of what is seemingly a primary esophageal dyspagia.  Pt with known hx esophageal dysphagia with esophagram in Feb of this year showing stasis of barium tablet.  Pt completed MBSS with [facility] in 2023 with oropharyngeal swallowing WFL.  On SLP arrival pt was coughing strongly with AM meal.  He reports feeling of stasis rather than airway intrusion and states that coughing helps to alleviate stasis, bring things back up.  He notices difficutly with food > liquids and endorses following solids with liquids can be beneficial  Pt with good tolerance of liquids and purees, although grimacing was noted  with puree and pt endorses stasis with puree. Pt initially reluctant to accept trials of solid.  With bite of regular solid cracker there was strong coughing observed, which pt again attributed to stasis. Discussed diet preference with pt.  He had staff cut food up for him today.  He does not want a pureed texture diet, but would  prefer soft, easier to chew foods.  Recommend soft foods at this time.  Pt with esophagram today, will wait for results before determining if additional swallowing evaluation is warranted. Pt reports hx esopahgeal dilation more than 5 years in the past.  If pharyngeal dysphagia observed on BaSw, or symptoms persist following any necessary intervention, consider repeat MBSS.    Recommend mechanical soft diet with thin liquids. Crush medications and administer with recommended consistencies (puree, liquids).   SLP Visit Diagnosis: Dysphagia, pharyngoesophageal phase (R13.14)    Aspiration Risk  Mild aspiration risk    Diet Recommendation Dysphagia 3 (Mech soft);Thin liquid    Liquid Administration via: Cup;Straw Medication Administration: Crushed with puree Supervision: Patient able to self feed Compensations: Slow rate;Small sips/bites;Follow solids with liquid Postural Changes: Seated upright at 90 degrees;Remain upright for at least 30 minutes after po intake    Other  Recommendations Recommended Consults: Consider esophageal assessment (workup pending) Oral Care Recommendations: Oral care BID     Assistance Recommended at Discharge  N/A  Functional Status Assessment Patient has had a recent decline in their functional status and demonstrates the ability to make significant improvements in function in a reasonable and predictable amount of time.  Frequency and Duration min 2x/week  2 weeks       Prognosis Prognosis for improved oropharyngeal function: Good      Swallow Study   General Date of Onset: 04/05/24 HPI: Todd Yin. is a 68 y.o. male who presented to the ED with testicular pain on the left.  He notes the pains been there approximately 2 months.  He also reports bilateral knee pain.  He has some epigastric pain and history of esophageal ulcers though he denies hematemesis dark stools.  CXR 8/21: Vascular congestion with diffuse interstitial opacity suggesting  edema. Pt with esophagram in February of this year with statsis of tablet.  Seen by speech 10/11/23 with recs for D3/thin. Pt with with medical history significant of paroxysmal a flutter, HTN, CHFpEF, polysubstance use, who admits to drinking at least 1 beer daily and reports being on chronic pain meds daily. Type of Study: Bedside Swallow Evaluation Previous Swallow Assessment: Clinical eval 10/11/23; MBS 06/21/22 with [facility] Diet Prior to this Study: Regular;Thin liquids (Level 0) Temperature Spikes Noted: No Respiratory Status: Room air History of Recent Intubation: No Behavior/Cognition: Alert;Cooperative;Pleasant mood Oral Cavity Assessment: Within Functional Limits Oral Care Completed by SLP: No Oral Cavity - Dentition: Missing dentition;Dentures, not available Vision: Functional for self-feeding Self-Feeding Abilities: Able to feed self Patient Positioning: Upright in bed Volitional Cough:  (Fair, but much stronger sounding reflexive cough) Volitional Swallow: Able to elicit    Oral/Motor/Sensory Function Overall Oral Motor/Sensory Function: Mild impairment Facial ROM: Within Functional Limits Facial Symmetry: Within Functional Limits Lingual ROM: Within Functional Limits Lingual Symmetry: Within Functional Limits Lingual Strength: Reduced Velum: Within Functional Limits Mandible: Within Functional Limits   Ice Chips Ice chips: Not tested   Thin Liquid Thin Liquid: Within functional limits Presentation: Straw    Nectar Thick Nectar Thick Liquid: Not tested   Honey Thick Honey Thick Liquid: Not tested   Puree Puree: Impaired  Other Comments: grimacing, stasis   Solid     Solid: Impaired Pharyngeal Phase Impairments: Cough - Immediate;Cough - Delayed      Anette FORBES Grippe, MA, CCC-SLP Acute Rehabilitation Services Office: [phone] 04/06/2024,10:14 AM

## 2024-04-06 NOTE — TOC Progression Note (Signed)
 Transition of Care (TOC) - Progression Note    Patient Details  Name: Todd [name]. MRN: [id number] Date of Birth: June 14, 1956  Transition of Care [facility]) CM/SW Contact  Bridget Cordella Simmonds, LCSW Phone Number: 04/06/2024, 2:16 PM  Clinical Narrative:   CSW spoke with pt, who provides additional information from meeting with RNCM yesterday.  Pt prior placement was through [facility] and was supposed to be ALF level.  He was there for 2 months.  Prior to that, pt was ambulatory with [facility] for short distances.  Pt does have disability income of $1200 per month that was funding this placement.  Discussed PT recommendation for SNF and pt is agreeable to this, permission given to send out referral in the hub.    CSW spoke with APS/[facility] CSW Nat Chang, who reports that she is hoping that pt can to STR to LTC at [facility].  LTC medicaid application is pending at this time.  She did not want to talk about other options through [facility].    Referral sent out in hub for SNF.      Expected Discharge Plan: Skilled Nursing Facility Barriers to Discharge: No Barriers Identified               Expected Discharge Plan and Services In-house Referral: Clinical Social Work Discharge Planning Services: CM Consult                                           Social Drivers of Health (SDOH) Interventions SDOH Screenings   Food Insecurity: Food Insecurity Present (10/11/2023)  Housing: High Risk (10/11/2023)  Transportation Needs: Unmet Transportation Needs (10/11/2023)  Utilities: Not At Risk (10/11/2023)  Alcohol Screen: Low Risk  (04/20/2023)  Financial Resource Strain: Low Risk  (10/27/2022)   [facility] from [facility]  Physical Activity: Inactive (10/27/2022)   Received from [facility]  Social Connections: Socially Isolated (10/11/2023)  Stress: Stress Concern Present (10/27/2022)   Received from [facility]  Tobacco Use: High Risk (01/12/2024)  Health  Literacy: Low Risk  (10/27/2022)   [facility] from [facility]    Readmission Risk Interventions    10/11/2023    3:09 PM 09/02/2021    1:12 PM  Readmission Risk Prevention Plan  Transportation Screening Complete Complete  PCP or Specialist Appt within 3-5 Days Complete   HRI or Home Care Consult Complete Complete  Social Work Consult for Recovery Care Planning/Counseling Complete Complete  Palliative Care Screening Complete Not Applicable  Medication Review Oceanographer) Complete

## 2024-04-06 NOTE — Consult Note (Signed)
 [facility] Psychiatric Consult Initial  Patient Name: .Todd [name].  MRN: [id number]  DOB: 08/22/1955  Consult Order details:  Orders (From admission, onward)     Start     Ordered   04/05/24 1052  IP CONSULT TO PSYCHIATRY       Comments: When asked if he was trying to hurt himself with trazodone , he said more or less, I didn't want to face the day  Ordering Provider: Perri DELENA Meliton [name]., MD  Provider:  (Not yet assigned)  Question Answer Comment  Location [facility]   Reason for Consult? suicidal ideation, taking 7-8 trazodone  a day      04/05/24 1052          Mode of Visit: In person   Psychiatry Consult Evaluation  Service Date: April 06, 2024 LOS:  LOS: 2 days  Chief Complaint:   Primary Psychiatric Diagnoses  Bipolar disorder, current episode mixed 2.  Ethanol use disorder 3.  Polysubstance use disorder (cocaine - in remission),   Assessment  Todd [name]. is a 68 y.o. male admitted: Presented to the ED for 04/04/2024  1:30 PM for testicular pain. He carries the psychiatric diagnoses of bipolar disorder, polysubstance use disorder (alcohol,  and has a past medical history of HTN, ulcerative esophagitis,  prostate cancer, BPPV, pulmonary embolus, heroin overdose, other opioid overdose, varicose veins, pulmonary nodule, SVT, RBBB, paroxysmal atrial flutter, congenital heart abnormality (unspecified),  His current presentation of persistently low mood, anergy, isolation, irritability, insomnia, increased substance use, with suicidal ideations and increasingly risky-medication use (trazodone  7+ pills nightly) is most consistent with bipolar disorder, current episode mixed. He meets criteria for bipolar disorder based on patient history, chart review.  Current outpatient psychotropic medications include sertraline  and trazodone  and historically he has had a mixed response to these medications. He was partially compliant with medications prior to  admission as evidenced by patient report. On initial examination, patient shared that he is not currently suicidal, but has had many of those thoughts over the last few weeks in the period of his worsening insomnia and depressive symptoms, leading him to take more and more alcohol and trazodone . Please see plan below for detailed recommendations.   Diagnoses:  Active Hospital problems: Principal Problem:   Hyponatremia Active Problems:   Non-insulin  dependent type 2 diabetes mellitus (HCC)   Affective bipolar disorder (HCC)   Primary hypertension   Alcohol abuse   Ulcerative esophagitis   Atrial flutter (HCC)   Acute bilateral knee pain   History of prostate cancer   Testicular pain, left    Plan   ## Psychiatric Medication Recommendations:  -- Continue patient sertraline  at 200 mg daily for depression  -- Increase abilify  5 mg to 10mg  for bipolar depression -- Increase metformin  250 mg BID to 500mg  BID for antipsychotic-associated weight gain -- Continue folic acid  1 mg daily due to previous deficiency, poor po intake  ## Medical Decision Making Capacity: Not specifically addressed in this encounter  ## Further Work-up:  -- EKG TSH, B12, folate, EKG, or While pt on Qtc prolonging medications, please monitor & replete K+ to 4 and Mg2+ to 2 -- most recent EKG on 8/21 had QtC of 493 (with bifascicular block) -- Pertinent labwork reviewed earlier this admission includes: cbc, bmp, A1c   ## Disposition:-- Plan Post Discharge/Psychiatric Care Follow-up resources will request psychiatric follow-up care with [facility] or his social worker  ## Behavioral / Environmental: - No specific recommendations at this time.     ##  Safety and Observation Level:  - Based on my clinical evaluation, I estimate the patient to be at low risk of self harm in the current setting. - At this time, we recommend  routine. This decision is based on my review of the chart including patient's history and  current presentation, interview of the patient, mental status examination, and consideration of suicide risk including evaluating suicidal ideation, plan, intent, suicidal or self-harm behaviors, risk factors, and protective factors. This judgment is based on our ability to directly address suicide risk, implement suicide prevention strategies, and develop a safety plan while the patient is in the clinical setting. Please contact our team if there is a concern that risk level has changed.  CSSR Risk Category:C-SSRS RISK CATEGORY: High Risk  Suicide Risk Assessment: Patient has following modifiable risk factors for suicide: under treated depression , social isolation, recklessness, medication noncompliance, active mental illness (to encompass adhd, tbi, mania, psychosis, trauma reaction), current symptoms: anxiety/panic, insomnia, impulsivity, anhedonia, hopelessness, and pain, medical illness (ie new dx of cancer), which we are addressing by starting a mood stabilizer, connecting him with outpatient psychiatric resources..  Patient has following non-modifiable or demographic risk factors for suicide: male gender, separation or divorce, history of suicide attempt, history of self harm behavior, and psychiatric hospitalization Patient has the following protective factors against suicide: Cultural, spiritual, or religious beliefs that discourage suicide  Thank you for this consult request. Recommendations have been communicated to the primary team.  We will follow at this time.   Penne Mori, DO       History of Present Illness  Relevant Aspects of Hospital Course:  Admitted on 04/04/2024 for knee pain, testicular pain, sore throat. They were found to have significant hyponatremia.   Patient Report:  04/06/2024 Patient continues to endorse poor mood unchanged from yesterday, however continues to endorse strong will and desire to improve.  We reviewed risks and benefits of Abilify  as well as side  effects, patient denies having seen any side effects but also denies seeing any benefit from the Abilify  so far.  Patient agrees to increase in medication.  Aims 0.  Negative for cogwheeling or tremors.  Continues with passive SI, denies active SI, denies HI, auditory or visual hallucinations.   04/05/2024 Met patient early afternoon bedside, he was pleasant and agreeable to interview. He reports 6 weeks of worsening depressive mood symptoms including feelings of guilt, worthlessness, passive suicidal ideation, rumination on past mistakes, with low energy, anhedonia, and increased use of substances (alcohol). He also reports a concerning pattern of severe insomnia with approximately one month of reduced sleep (2-3 hours per night) despite larger and larger doses of trazodone . He reports passive suicidal ideation over the last month due to his worsening overall health, his social situation (isolated from friends, family, community of faith), and his living situation.  He reports that he was taking 7-8 pills of trazodone  300 mg in order to get some rest, but he was aware that dosage like this might be dangerous. He denies current SI, HI, AVH.  Psych ROS:  Depression: guilt, worthlessness, passive suicidal ideation, rumination on past mistakes, with low energy, anhedonia, and increased use of substances (alcohol) Anxiety:  denies Mania (lifetime and current): denies grandiosity, pressured speech, endorses irritability, poor concentration, severe reductions in sleep. Psychosis: (lifetime and current): denies current, not seen responding to internal stimuli  Collateral information:  Contacted Nat Chang (case worker) at [phone] on 04/05/2024, 1300, left VM  Collateral information:  Contacted Candy  Morino (spouse, separated, [phone]) on 04/05/2024, 1310 -1325. She has had a stroke and has some difficulty making herself understood on the phone.  Married for 10 years separated for 5 years. She  lives in [place] [facility], talks with patient 2-3 times per week. They have remained very close. She is concerned about the patient. Last saw him in person around Father's day when she stayed for several weeks.  Patient has been grieving due to the death of his father on 04-03-2024, sister, and a friend. Medical situation - she went with him to several appointments, she saw pt with his PCP and orthopedist.  No history of AVH. Strong history of insomnia. History of chronic pain. 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 History of involvement in AA, but none currently.  Patient was sober, but fell off the wagon and has had drinking problems and substance use problems for many years. Denies statements of self-harm, denies firearms, does endorse many pills at home.    Review of Systems  Musculoskeletal:  Positive for joint pain.  Psychiatric/Behavioral:  Positive for depression, substance abuse and suicidal ideas. The patient has insomnia.      Psychiatric and Social History  Psychiatric History:  Information collected from chart review, patient  Prev Dx/Sx: Bipolar disorder, alcohol use disorder, opioid use disorder, stimulant use disorder (cocaine) Current Psych Provider: None Home Meds (current): sertraline , does not remember other medications Previous Med Trials: depakote , sertraline150 mg, mirtazapine, olanzapine, sertraline , trazodone ,  Therapy: none current  Prior Psych Hospitalization: Last hospitalization 04/21/2023  Prior Self Harm: yes. Frequent history of suicidal ideation with some plans. Prior Violence: denies, spouse denies  Family Psych History:  Family Hx suicide: denies  Social History:  Developmental Hx: Grew up in [place] , has been in  for 30 years Educational Hx: finished high school Occupational Hx: former Energy manager, former Academic librarian. Legal Hx: denies Living Situation: lives in  subsidized housing Spiritual Hx: christian, baptist. Access to weapons/lethal means: denies firearms access. Has access to drugs of abuse    Substance use history: Alcohol: daily Type of alcohol: beer Last Drink: days before admission Number of drinks per day: not sure, more than a few History of alcohol withdrawal seizures: denies History of DT's: denies Tobacco: 5.4 pack-year smoking history.  Illicit drugs:  Marijuana/THC: past Cocaine: yes, past Methamphetamines: no Benzodiazepines: no Opioids: yes, prescribed oxycodone  for pay Hallucinogens: no Bath salts: no Prescription Drug abuse problems:  Exam Findings  Physical Exam:  Vital Signs:  Temp:  [97.5 F (36.4 C)-98.8 F (37.1 C)] 97.5 F (36.4 C) (08/22 0743) Pulse Rate:  [66-83] 78 (08/22 0743) Resp:  [15-17] 16 (08/22 0743) BP: (115-132)/(74-87) 125/87 (08/22 0743) SpO2:  [89 %-100 %] 95 % (08/22 0743) Blood pressure 125/87, pulse 78, temperature (!) 97.5 F (36.4 C), temperature source Rectal, resp. rate 16, height 5' 8 (1.727 m), weight 75.8 kg, SpO2 95%. Body mass index is 25.41 kg/m.  Physical Exam Vitals reviewed.  Constitutional:      Appearance: He is obese. He is ill-appearing.  HENT:     Head: Normocephalic and atraumatic.  Pulmonary:     Breath sounds: Rhonchi present.  Skin:    General: Skin is warm.  Neurological:     Mental Status: He is oriented to person, place, and time.     Mental Status Exam  Apperance: Appropriate for environment and Laying in  bed Behavior: Calm Speech: Normal Rate, Articulate, Normal Volume, and Responsive Attitude: Cooperative and Friendly Mood: not well Affect: DEPRESSED, Normal Range, and Mood Congruent Perception: Not responding to internal stimuli Thought Content: within normal limits Thought Form: Goal Directed, Organized, Linear, and Logical Cognition: Alert & Oriented to person, place, and time, Recent and Remote memory grossly intact by  recounting personal history, and Immediate memory grossly intact by interview Judgment: Fair Insight: Fair      Other History   These have been pulled in through the EMR, reviewed, and updated if appropriate.  Family History:  The patient's family history includes Cancer in his father.  Medical History: Past Medical History:  Diagnosis Date   Arthritis    fingers (07/14/2018)   Depression    DVT (deep venous thrombosis) (HCC) LLE   Hepatitis C     finished harvoni tx ~ 2017   Hypercholesterolemia    Hypertension    Prostate cancer (HCC) 101yrs ago   Pulmonary embolism and infarction (HCC) 07/13/2018   Sleep apnea    not currently using cpap, mask causing vertigo   Type II diabetes mellitus (HCC)     Surgical History: Past Surgical History:  Procedure Laterality Date   BIOPSY  01/12/2019   Procedure: BIOPSY;  Surgeon: Donnald Charleston, MD;  Location: [facility] ENDOSCOPY;  Service: Endoscopy;;   BIOPSY  06/23/2019   Procedure: BIOPSY;  Surgeon: Burnette Fallow, MD;  Location[facility] ENDOSCOPY;  Service: Endoscopy;;   COLONOSCOPY WITH PROPOFOL  N/A 02/19/2014   Procedure: COLONOSCOPY WITH PROPOFOL ;  Surgeon: Gladis MARLA Louder, MD;  Location: [facility] ENDOSCOPY;  Service: Endoscopy;  Laterality: N/A;   ENDOVENOUS ABLATION SAPHENOUS VEIN W/ LASER Left 11/22/2017   endovenous laser ablation L SSV by Lynwood Collum MD    ESOPHAGEAL BRUSHING  06/23/2019   Procedure: ESOPHAGEAL BRUSHING;  Surgeon: Burnette Fallow, MD;  Location: [facility] ENDOSCOPY;  Service: Endoscopy;;   ESOPHAGOGASTRODUODENOSCOPY (EGD) WITH PROPOFOL  N/A 01/12/2019   Procedure: ESOPHAGOGASTRODUODENOSCOPY (EGD) WITH PROPOFOL ;  Surgeon: Donnald Charleston, MD;  Location: [facility] ENDOSCOPY;  Service: Endoscopy;  Laterality: N/A;  Patient is also scheduled for barium swallow; please notify radiology after patient's EGD is complete so that barium swallow follows the endoscopy, not vice versa   ESOPHAGOGASTRODUODENOSCOPY (EGD) WITH PROPOFOL  N/A 06/23/2019    Procedure: ESOPHAGOGASTRODUODENOSCOPY (EGD) WITH PROPOFOL ;  Surgeon: Burnette Fallow, MD;  Location: [facility] ENDOSCOPY;  Service: Endoscopy;  Laterality: N/A;   PROSTATECTOMY  2008   REPAIR QUADRICEPS / HAMSTRING MUSCLE Right      Medications:   Current Facility-Administered Medications:    acetaminophen  (TYLENOL ) tablet 1,000 mg, 1,000 mg, Oral, Q8H, Perri DELENA Meliton Mickey., MD, 1,000 mg at 04/05/24 2218   albuterol  (PROVENTIL ) (2.5 MG/3ML) 0.083% nebulizer solution 2.5 mg, 2.5 mg, Nebulization, Q2H PRN, Fredirick Glenys RAMAN, MD   [START ON 04/07/2024] ARIPiprazole  (ABILIFY ) tablet 10 mg, 10 mg, Oral, Daily, Pericles Carmicheal B, DO   [START ON 04/07/2024] cosyntropin  (CORTROSYN ) injection 0.25 mg, 0.25 mg, Intravenous, Once, Perri DELENA Meliton Mickey., MD   cyanocobalamin  (VITAMIN B12) tablet 500-1,000 mcg, 500-1,000 mcg, Oral, Daily, Fredirick Glenys RAMAN, MD, 1,000 mcg at 04/06/24 1008   diclofenac  Sodium (VOLTAREN ) 1 % topical gel 2 g, 2 g, Topical, QID, Perri DELENA Meliton Mickey., MD, 2 g at 04/06/24 1010   enoxaparin  (LOVENOX ) injection 40 mg, 40 mg, Subcutaneous, Q24H, Fredirick Glenys RAMAN, MD, 40 mg at 04/06/24 1009   folic acid  (FOLVITE ) tablet 1 mg, 1 mg, Oral, Daily, Delsie Lynwood Morene Lavone, MD, 1 mg  at 04/06/24 1008   gabapentin  (NEURONTIN ) capsule 300 mg, 300 mg, Oral, TID, Perri DELENA Meliton Mickey., MD   hydrOXYzine  (ATARAX ) tablet 25 mg, 25 mg, Oral, TID PRN, Fredirick Glenys RAMAN, MD, 25 mg at 04/06/24 1008   LORazepam  (ATIVAN ) injection 0-4 mg, 0-4 mg, Intravenous, Q6H **OR** LORazepam  (ATIVAN ) tablet 0-4 mg, 0-4 mg, Oral, Q6H, Fredirick Glenys RAMAN, MD   [START ON 04/07/2024] LORazepam  (ATIVAN ) injection 0-4 mg, 0-4 mg, Intravenous, Q12H **OR** [START ON 04/07/2024] LORazepam  (ATIVAN ) tablet 0-4 mg, 0-4 mg, Oral, Q12H, Pfeiffer, Marcy, MD   melatonin tablet 3 mg, 3 mg, Oral, QHS, Pratt, Tanya S, MD, 3 mg at 04/05/24 2218   metFORMIN  (GLUCOPHAGE ) tablet 500 mg, 500 mg, Oral, BID WC, Katriana Dortch B, DO   metoprolol  tartrate  (LOPRESSOR ) injection 5 mg, 5 mg, Intravenous, Q6H PRN, Fredirick Glenys RAMAN, MD   nicotine  (NICODERM CQ  - dosed in mg/24 hours) patch 14 mg, 14 mg, Transdermal, Daily, Fredirick Glenys RAMAN, MD, 14 mg at 04/06/24 1010   ondansetron  (ZOFRAN ) tablet 4 mg, 4 mg, Oral, Q6H PRN **OR** ondansetron  (ZOFRAN ) injection 4 mg, 4 mg, Intravenous, Q6H PRN, Fredirick Glenys RAMAN, MD   oxyCODONE  (Oxy IR/ROXICODONE ) immediate release tablet 5 mg, 5 mg, Oral, Q4H PRN **OR** oxyCODONE  (Oxy IR/ROXICODONE ) immediate release tablet 10 mg, 10 mg, Oral, Q4H PRN, Perri DELENA Meliton Mickey., MD, 10 mg at 04/06/24 9364   pantoprazole  (PROTONIX ) EC tablet 40 mg, 40 mg, Oral, BID, Pratt, Tanya S, MD, 40 mg at 04/06/24 1008   polyethylene glycol (MIRALAX  / GLYCOLAX ) packet 17 g, 17 g, Oral, Daily PRN, Fredirick Glenys RAMAN, MD, 17 g at 04/06/24 1009   sertraline  (ZOLOFT ) tablet 200 mg, 200 mg, Oral, Daily, Pratt, Tanya S, MD, 200 mg at 04/06/24 1008   thiamine  (VITAMIN B1) tablet 100 mg, 100 mg, Oral, Daily, 100 mg at 04/06/24 1009 **OR** thiamine  (VITAMIN B1) injection 100 mg, 100 mg, Intravenous, Daily, Fredirick Glenys RAMAN, MD  Allergies: No Known Allergies  Penne Mori, DO

## 2024-04-06 NOTE — Progress Notes (Signed)
 Patient refused CIWA assessment, verbalized he's not answering any questions now. Education provided.

## 2024-04-06 NOTE — Evaluation (Signed)
 Occupational Therapy Evaluation Patient Details Name: Todd [name]. MRN: [id number] DOB: 1956/04/01 Today's Date: 04/06/2024   History of Present Illness   68 y.o. male who presents to the ED 8/20 with testicular pain on the left. Found to have Hyponatremia. PMH: paroxysmal a flutter, HTN, CHFpEF, polysubstance use, who admits to drinking at least 1 beer daily and reports being on chronic pain meds daily     Clinical Impressions Per pt report, pt has been completing ADLs Mod I with significantly increased time due to not having assist available; however, pt reports he would benefit from help at baseline. Pt also reports he has been sitting on his Rollator and using his feet to push himself around with pt reporting he has not left the house in two months. Pt now presents with decreased activity tolerance, decreased B UE strength, decreased balance, pain affecting functional level, impaired cognition (uncertain of baseline), and decreased safety and independence with functional tasks. Pt largely demonstrating ability to complete ADLs with Set up To Mod assist. Pt with some self-limiting behaviors this session, but responding well to encouragement and positive feedback. VSS on RA throughout session. Pt will benefit from acute skilled O2 services to address deficits and increase safety and independence with functional tasks. Post acute discharge, pt will benefit from intensive inpatient skilled rehab services < 3 hours per day to maximize rehab potential.      If plan is discharge home, recommend the following:   A little help with walking and/or transfers;A lot of help with bathing/dressing/bathroom;Assistance with cooking/housework;Assist for transportation;Help with stairs or ramp for entrance;Direct supervision/assist for medications management;Direct supervision/assist for financial management     Functional Status Assessment   Patient has had a recent decline in their functional status  and demonstrates the ability to make significant improvements in function in a reasonable and predictable amount of time.     Equipment Recommendations   Other (comment) (defer to next level of care)     Recommendations for Other Services         Precautions/Restrictions   Precautions Precautions: Fall Recall of Precautions/Restrictions: Intact Restrictions Weight Bearing Restrictions Per Provider Order: No     Mobility Bed Mobility Overal bed mobility: Needs Assistance Bed Mobility: Supine to Sit, Sit to Supine     Supine to sit: Supervision, HOB elevated, Used rails Sit to supine: Supervision, HOB elevated, Used rails   General bed mobility comments: with mildly increased time and effort    Transfers Overall transfer level: Needs assistance Equipment used: Rolling walker (2 wheels) Transfers: Sit to/from Stand Sit to Stand: Min assist           General transfer comment: Pt politely declining further transfer attempts this sesion due to B knee pain. During PT eval on 8/21, pt demonstrated ability to perform STS and step-pivot transfers with a RW with Min assist      Balance Overall balance assessment: Needs assistance Sitting-balance support: No upper extremity supported, Feet supported Sitting balance-Leahy Scale: Good     Standing balance support: Single extremity supported, Bilateral upper extremity supported, During functional activity, Reliant on assistive device for balance Standing balance-Leahy Scale: Poor                             ADL either performed or assessed with clinical judgement   ADL Overall ADL's : Needs assistance/impaired Eating/Feeding: Set up;Sitting   Grooming: Set up;Sitting   Upper Body Bathing: Contact  guard assist;Sitting (with increased time)   Lower Body Bathing: Minimal assistance;Moderate assistance;Sitting/lateral leans;Cueing for compensatory techniques;Sit to/from stand   Upper Body Dressing :  Contact guard assist;Sitting   Lower Body Dressing: Supervision/safety;Minimal assistance;Moderate assistance;Cueing for compensatory techniques;Sitting/lateral leans;Sit to/from stand Lower Body Dressing Details (indicate cue type and reason): Supervision to donn/doff socks in sitting with increased time   Toilet Transfer Details (indicate cue type and reason): pt declined OOB transfers this session Toileting- Clothing Manipulation and Hygiene: Set up;Minimal assistance;Moderate assistance;Sitting/lateral lean;Sit to/from stand Toileting - Clothing Manipulation Details (indicate cue type and reason): Set up for use of hand held urinal in sitting; up to Mod assist to assist with peri care in standing in simulated task             Vision Baseline Vision/History: 1 Wears glasses Ability to See in Adequate Light: 0 Adequate (with glasses) Patient Visual Report: No change from baseline Additional Comments: Vision [facility] for tasks assessed     Perception         Praxis         Pertinent Vitals/Pain Pain Assessment Pain Assessment: 0-10 Pain Score: 8  Pain Location: B knees Pain Descriptors / Indicators: Aching, Guarding, Discomfort Pain Intervention(s): Limited activity within patient's tolerance, Monitored during session, Repositioned     Extremity/Trunk Assessment Upper Extremity Assessment Upper Extremity Assessment: Right hand dominant;Generalized weakness;RUE deficits/detail;LUE deficits/detail RUE Deficits / Details: generalized weakness; ROM, coordination, and sensation WFL LUE Deficits / Details: generalized weakness; pt reports old shoulder injury and presents with deficits in shoulder flexion and horizontal abduction with pt reporting this is at baseline; shoulder ROM still WFL with deficits; all other ROM, coordination, and sensation WFL   Lower Extremity Assessment Lower Extremity Assessment: Defer to PT evaluation       Communication Communication Communication:  Impaired Factors Affecting Communication: Reduced clarity of speech   Cognition Arousal: Alert Behavior During Therapy: WFL for tasks assessed/performed Cognition: No family/caregiver present to determine baseline, Cognition impaired     Awareness: Intellectual awareness intact, Online awareness intact Memory impairment (select all impairments): Declarative long-term memory, Working memory Attention impairment (select first level of impairment): Alternating attention Executive functioning impairment (select all impairments): Problem solving, Organization OT - Cognition Comments: Pt AAOx4 and pleasant throughout session. Pt with cognition largely [facility] for tasks assessed but with deficits as noted above. Uncertain of pt's baseline.                 Following commands: Intact       Cueing  General Comments   Cueing Techniques: Verbal cues;Gestural cues;Visual cues  OT re-educated pt in importance of mobility with staff assist to reduce risks associated with immobility. Pt stating multiple times he cannot do requested tasks, such as bed mobility or donning/doffing socks, then able to do these tasks. NT present during a portion of session.   Exercises     Shoulder Instructions      Home Living Family/patient expects to be discharged to:: Unsure Living Arrangements: Group Home (Uncertain if pt is describing a group home or a boarding home) Available Help at Discharge: Family;Available PRN/intermittently Type of Home: House Home Access: Stairs to enter [employer] of Steps: 5 Entrance Stairs-Rails: Right;Left Home Layout: One level     Bathroom Shower/Tub: Chief Strategy Officer: Standard Bathroom Accessibility: No   Home Equipment: Rollator (4 wheels);Grab bars - tub/shower;BSC/3in1          Prior Functioning/Environment Prior Level of Function : Needs  assist             Mobility Comments: Pt states he has not walked in 2 months but has  been sitting on rollator to push himself around to the porch and transfering to/from [facility] with step-pivot transfer. States he has declined over the past 2 months. ADLs Comments: States he would transfer himself to [facility] and someone would empty his bucket every 3 days. Pt reports he hasn't showered in 2 months as he needs assistance and doesn't have assistance available. Pt reports getting dressed and performing toileting with Mod I due to not having assistance available. Pt reports getting dressed typically takes him 45 minutes to an hour.    OT Problem List: Decreased strength;Decreased activity tolerance;Impaired balance (sitting and/or standing);Decreased cognition;Decreased knowledge of use of DME or AE;Pain   OT Treatment/Interventions: Self-care/ADL training;Therapeutic exercise;Energy conservation;DME and/or AE instruction;Therapeutic activities;Cognitive remediation/compensation;Patient/family education;Balance training      OT Goals(Current goals can be found in the care plan section)   Acute Rehab OT Goals Patient Stated Goal: to more into an adult care facility so that he has more assistance OT Goal Formulation: With patient Time For Goal Achievement: 04/20/24 Potential to Achieve Goals: Good ADL Goals Pt Will Perform Lower Body Bathing: with contact guard assist;sitting/lateral leans;sit to/from stand (with adaptive equipment as needed) Pt Will Perform Lower Body Dressing: with supervision;sitting/lateral leans;sit to/from stand (with adaptive equipment as needed) Pt Will Transfer to Toilet: with contact guard assist;ambulating;regular height toilet;grab bars (with least restrictive AD) Pt Will Perform Toileting - Clothing Manipulation and hygiene: with contact guard assist;sit to/from stand Pt/caregiver will Perform Home Exercise Program: Increased strength;Both right and left upper extremity;With theraband;With Supervision;With written HEP provided (increased activity tolerance)    OT Frequency:  Min 1X/week    Co-evaluation              AM-PAC OT 6 Clicks Daily Activity     Outcome Measure Help from another person eating meals?: A Little Help from another person taking care of personal grooming?: A Little Help from another person toileting, which includes using toliet, bedpan, or urinal?: A Lot Help from another person bathing (including washing, rinsing, drying)?: A Lot Help from another person to put on and taking off regular upper body clothing?: A Little Help from another person to put on and taking off regular lower body clothing?: A Lot 6 Click Score: 15   End of Session Nurse Communication: Mobility status;Other (comment) (Pt self-limiting, but responds well to encouragement. Pt declining further transfers.)  Activity Tolerance: Patient tolerated treatment well;Patient limited by pain;Other (comment) (Pt somewhat self-limiting) Patient left: in bed;with call bell/phone within reach  OT Visit Diagnosis: Unsteadiness on feet (R26.81);Other abnormalities of gait and mobility (R26.89);Muscle weakness (generalized) (M62.81);Other symptoms and signs involving cognitive function;Pain                Time: 1405-1430 OT Time Calculation (min): 25 min Charges:  OT General Charges $OT Visit: 1 Visit OT Evaluation $OT Eval Moderate Complexity: 1 Mod  Margarie Rockey HERO., OTR/L, MA Acute Rehab [phone]   Margarie FORBES Horns 04/06/2024, 3:03 PM

## 2024-04-06 NOTE — NC FL2 (Signed)
 [place]  MEDICAID FL2 LEVEL OF CARE FORM     IDENTIFICATION  Patient Name: Todd [name]. Birthdate: [birth year] Sex: male Admission Date (Current Location): 04/04/2024  [facility] and [place] Number:  Producer, television/film/video and Address:  [place]. [facility], [address] [place], [place] [address]      Provider Number: [id number]  Attending Physician Name and Address:  Perri DELENA Meliton Raddle., *  Relative Name and Phone Number:  Hauschild,Tica Niece [phone]    Current Level of Care: Hospital Recommended Level of Care:   Prior Approval Number:    Date Approved/Denied:   PASRR Number: [id number]  Discharge Plan: SNF    Current Diagnoses: Patient Active Problem List   Diagnosis Date Noted   Alcohol use disorder, severe, dependence (HCC) 01/12/2024   Macrocytic anemia 10/11/2023   Chronic diastolic CHF (congestive heart failure) (HCC) 10/11/2023   SOB (shortness of breath) 10/11/2023   Supraventricular tachycardia (HCC) 10/11/2023   SVT (supraventricular tachycardia) (HCC) 10/10/2023   Suicidal ideation 04/20/2023   Suicidal ideations 04/20/2023   Acute bilateral knee pain 05/19/2022   Ascending aorta enlargement (HCC) 05/19/2022   Esophageal dysphagia 05/19/2022   History of prostate cancer 05/19/2022   Testicular pain, left 05/19/2022   Major depressive disorder, recurrent episode, severe, with psychosis (HCC) 02/15/2022   Cocaine dependence (HCC) 02/15/2022   Paroxysmal atrial flutter (HCC) 08/31/2021   Tachycardia 08/30/2021   Atrial flutter (HCC) 08/30/2021   Hyponatremia 08/30/2021   Rib fracture 08/30/2021   Compression fracture of body of thoracic vertebra (HCC) 08/30/2021   Pulmonary nodule 08/30/2021   Heroin overdose (HCC) 10/01/2020   Opiate overdose (HCC) 10/01/2020   Ulcerative esophagitis 06/30/2019   Chronic gastritis 04/07/2019   AMS (altered mental status) 04/03/2019   Pulmonary embolus and infarction (HCC) 07/13/2018    Opioid use disorder, moderate, dependence (HCC) 06/29/2018   Alcohol abuse 06/27/2018   MDD (major depressive disorder), recurrent severe, without psychosis (HCC) 06/26/2018   Varicose veins of left lower extremity with complications 08/15/2017   Benign paroxysmal positional vertigo 11/08/2013   RBBB 06/02/2009   Congenital anomaly of heart 06/02/2009   Non-insulin  dependent type 2 diabetes mellitus (HCC) 04/11/2009   Substance abuse (HCC) 04/11/2009   Affective bipolar disorder (HCC) 04/11/2009   Primary hypertension 04/11/2009   Diverticulosis of colon 04/11/2009   CHOLECYSTITIS 04/11/2009   Chest pain of uncertain etiology 04/11/2009   Abdominal pain 04/11/2009    Orientation RESPIRATION BLADDER Height & Weight     Self, Time, Situation, Place  Normal Continent Weight: 167 lb 1.7 oz (75.8 kg) Height:  5' 8 (172.7 cm)  BEHAVIORAL SYMPTOMS/MOOD NEUROLOGICAL BOWEL NUTRITION STATUS      Continent Diet (see discharge summary)  AMBULATORY STATUS COMMUNICATION OF NEEDS Skin   Limited Assist Verbally Skin abrasions, Other (Comment) (redness)                       Personal Care Assistance Level of Assistance  Bathing, Feeding, Dressing Bathing Assistance: Limited assistance Feeding assistance: Limited assistance Dressing Assistance: Limited assistance     Functional Limitations Info  Sight, Hearing, Speech Sight Info: Adequate Hearing Info: Adequate Speech Info: Adequate    SPECIAL CARE FACTORS FREQUENCY  PT (By licensed PT), OT (By licensed OT)     PT Frequency: 5x week OT Frequency: 5x week            Contractures Contractures Info: Not present    Additional Factors Info  Code Status, Allergies Code Status Info: full Allergies Info: NKA           Current Medications (04/06/2024):  This is the current hospital active medication list Current Facility-Administered Medications  Medication Dose Route Frequency Provider Last Rate Last Admin   acetaminophen   (TYLENOL ) tablet 1,000 mg  1,000 mg Oral Q8H Perri DELENA Meliton Mickey., MD   1,000 mg at 04/05/24 2218   albuterol  (PROVENTIL ) (2.5 MG/3ML) 0.083% nebulizer solution 2.5 mg  2.5 mg Nebulization Q2H PRN Pratt, Tanya S, MD       [START ON 04/07/2024] ARIPiprazole  (ABILIFY ) tablet 10 mg  10 mg Oral Daily Prunty, Donald B, DO       [START ON 04/07/2024] cosyntropin  (CORTROSYN ) injection 0.25 mg  0.25 mg Intravenous Once Perri DELENA Meliton Mickey., MD       cyanocobalamin  (VITAMIN B12) tablet 500-1,000 mcg  500-1,000 mcg Oral Daily Pratt, Tanya S, MD   1,000 mcg at 04/06/24 1008   diclofenac  Sodium (VOLTAREN ) 1 % topical gel 2 g  2 g Topical QID Perri DELENA Meliton Mickey., MD   2 g at 04/06/24 1010   enoxaparin  (LOVENOX ) injection 40 mg  40 mg Subcutaneous Q24H Pratt, Tanya S, MD   40 mg at 04/06/24 1009   folic acid  (FOLVITE ) tablet 1 mg  1 mg Oral Daily Delsie Lynwood Morene Lavone, MD   1 mg at 04/06/24 1008   gabapentin  (NEURONTIN ) capsule 300 mg  300 mg Oral TID Perri DELENA Meliton Mickey., MD       hydrOXYzine  (ATARAX ) tablet 25 mg  25 mg Oral TID PRN Pratt, Tanya S, MD   25 mg at 04/06/24 1008   LORazepam  (ATIVAN ) injection 0-4 mg  0-4 mg Intravenous Q6H Pratt, Tanya S, MD       Or   LORazepam  (ATIVAN ) tablet 0-4 mg  0-4 mg Oral Q6H Fredirick Glenys RAMAN, MD       [START ON 04/07/2024] LORazepam  (ATIVAN ) injection 0-4 mg  0-4 mg Intravenous Q12H Armenta Canning, MD       Or   NOREEN ON 04/07/2024] LORazepam  (ATIVAN ) tablet 0-4 mg  0-4 mg Oral Q12H Pfeiffer, Canning, MD       melatonin tablet 3 mg  3 mg Oral QHS Pratt, Tanya S, MD   3 mg at 04/05/24 2218   metFORMIN  (GLUCOPHAGE ) tablet 500 mg  500 mg Oral BID WC Lera Golas B, DO       metoprolol  tartrate (LOPRESSOR ) injection 5 mg  5 mg Intravenous Q6H PRN Pratt, Tanya S, MD       nicotine  (NICODERM CQ  - dosed in mg/24 hours) patch 14 mg  14 mg Transdermal Daily Pratt, Tanya S, MD   14 mg at 04/06/24 1010   ondansetron  (ZOFRAN ) tablet 4 mg  4 mg Oral Q6H PRN Pratt,  Tanya S, MD       Or   ondansetron  (ZOFRAN ) injection 4 mg  4 mg Intravenous Q6H PRN Pratt, Tanya S, MD       oxyCODONE  (Oxy IR/ROXICODONE ) immediate release tablet 5 mg  5 mg Oral Q4H PRN Perri DELENA Meliton Mickey., MD       Or   oxyCODONE  (Oxy IR/ROXICODONE ) immediate release tablet 10 mg  10 mg Oral Q4H PRN Perri DELENA Meliton Mickey., MD   10 mg at 04/06/24 9364   pantoprazole  (PROTONIX ) EC tablet 40 mg  40 mg Oral BID Pratt, Tanya S, MD   40 mg at 04/06/24 1008   polyethylene  glycol (MIRALAX  / GLYCOLAX ) packet 17 g  17 g Oral Daily PRN Pratt, Tanya S, MD   17 g at 04/06/24 1009   sertraline  (ZOLOFT ) tablet 200 mg  200 mg Oral Daily Pratt, Tanya S, MD   200 mg at 04/06/24 1008   thiamine  (VITAMIN B1) tablet 100 mg  100 mg Oral Daily Pratt, Tanya S, MD   100 mg at 04/06/24 1009   Or   thiamine  (VITAMIN B1) injection 100 mg  100 mg Intravenous Daily Pratt, Tanya S, MD         Discharge Medications: Please see discharge summary for a list of discharge medications.  Relevant Imaging Results:  Relevant Lab Results:   Additional Information SS# [id number]  Bridget Cordella Simmonds, LCSW

## 2024-04-06 NOTE — Progress Notes (Signed)
 SLP Cancellation Note  Patient Details Name: Todd [name]. MRN: [id number] DOB: 11-Jan-1956   Cancelled treatment:        Attempted to see pt for swallowing assessment. Pt off floor for procedure at time of attempt.  SLP will re-attempt as schedule permits.   Anette FORBES Grippe, MA, CCC-SLP Acute Rehabilitation Services Office: [phone] 04/06/2024, 8:48 AM

## 2024-04-07 ENCOUNTER — Inpatient Hospital Stay (HOSPITAL_COMMUNITY)

## 2024-04-07 DIAGNOSIS — F314 Bipolar disorder, current episode depressed, severe, without psychotic features: Secondary | ICD-10-CM | POA: Diagnosis not present

## 2024-04-07 DIAGNOSIS — E871 Hypo-osmolality and hyponatremia: Secondary | ICD-10-CM | POA: Diagnosis not present

## 2024-04-07 LAB — ACTH STIMULATION, 3 TIME POINTS
Cortisol, 30 Min: 29.9 ug/dL
Cortisol, 60 Min: 37.2 ug/dL
Cortisol, Base: 14.5 ug/dL

## 2024-04-07 LAB — CBC WITH DIFFERENTIAL/PLATELET
Abs Immature Granulocytes: 0.02 K/uL (ref 0.00–0.07)
Basophils Absolute: 0 K/uL (ref 0.0–0.1)
Basophils Relative: 0 %
Eosinophils Absolute: 0.1 K/uL (ref 0.0–0.5)
Eosinophils Relative: 2 %
HCT: 40 % (ref 39.0–52.0)
Hemoglobin: 13.5 g/dL (ref 13.0–17.0)
Immature Granulocytes: 0 %
Lymphocytes Relative: 26 %
Lymphs Abs: 1.3 K/uL (ref 0.7–4.0)
MCH: 31.6 pg (ref 26.0–34.0)
MCHC: 33.8 g/dL (ref 30.0–36.0)
MCV: 93.7 fL (ref 80.0–100.0)
Monocytes Absolute: 0.6 K/uL (ref 0.1–1.0)
Monocytes Relative: 11 %
Neutro Abs: 3.1 K/uL (ref 1.7–7.7)
Neutrophils Relative %: 61 %
Platelets: 256 K/uL (ref 150–400)
RBC: 4.27 MIL/uL (ref 4.22–5.81)
RDW: 13.2 % (ref 11.5–15.5)
WBC: 5.1 K/uL (ref 4.0–10.5)
nRBC: 0 % (ref 0.0–0.2)

## 2024-04-07 LAB — COMPREHENSIVE METABOLIC PANEL WITH GFR
ALT: 10 U/L (ref 0–44)
AST: 15 U/L (ref 15–41)
Albumin: 2.9 g/dL — ABNORMAL LOW (ref 3.5–5.0)
Alkaline Phosphatase: 38 U/L (ref 38–126)
Anion gap: 7 (ref 5–15)
BUN: 15 mg/dL (ref 8–23)
CO2: 25 mmol/L (ref 22–32)
Calcium: 9.3 mg/dL (ref 8.9–10.3)
Chloride: 98 mmol/L (ref 98–111)
Creatinine, Ser: 1.26 mg/dL — ABNORMAL HIGH (ref 0.61–1.24)
GFR, Estimated: >60 mL/min (ref >60)
Glucose, Bld: 98 mg/dL (ref 70–99)
Potassium: 4.2 mmol/L (ref 3.5–5.1)
Sodium: 130 mmol/L — ABNORMAL LOW (ref 135–145)
Total Bilirubin: 0.6 mg/dL (ref 0.0–1.2)
Total Protein: 7 g/dL (ref 6.5–8.1)

## 2024-04-07 LAB — GLUCOSE, CAPILLARY
Glucose-Capillary: 90 mg/dL (ref 70–99)
Glucose-Capillary: 140 mg/dL — ABNORMAL HIGH (ref 70–99)
Glucose-Capillary: 115 mg/dL — ABNORMAL HIGH (ref 70–99)

## 2024-04-07 LAB — PHOSPHORUS: Phosphorus: 3.7 mg/dL (ref 2.5–4.6)

## 2024-04-07 LAB — LIPASE, BLOOD: Lipase: 33 U/L (ref 11–51)

## 2024-04-07 LAB — MAGNESIUM: Magnesium: 1.8 mg/dL (ref 1.7–2.4)

## 2024-04-07 MED ORDER — POLYETHYLENE GLYCOL 3350 17 G PO PACK
17.0000 g | PACK | Freq: Two times a day (BID) | ORAL | Status: DC
  Administered 2024-04-07 – 2024-04-10 (×3): 17 g via ORAL
  Filled 2024-04-07 (×10): qty 1

## 2024-04-07 MED ORDER — DEXAMETHASONE SODIUM PHOSPHATE 4 MG/ML IJ SOLN
4.0000 mg | Freq: Once | INTRAMUSCULAR | Status: AC
  Administered 2024-04-07: 4 mg via INTRAVENOUS
  Filled 2024-04-07: qty 1

## 2024-04-07 NOTE — H&P (View-Only) (Signed)
 Referring Provider: TH Primary Care Physician:  Dwight Trula SQUIBB, MD Primary Gastroenterologist: Margarete primary  Reason for Consultation: Dysphagia, esophageal stricture  HPI: Todd [name]. is a 68 y.o. male with past medical history of polysubstance abuse with history of cocaine and heroin use, history of bipolar disorder, history of paroxysmal atrial fibrillation, history of DVT and PE NOT on anticoagulation presented to the hospital with testicular pain.  Upon initial workup, he was found to have significant hyponatremia with sodium count 121.  Patient subsequently started complaining about poor oral intake and dysphagia for last few days.  Barium swallow yesterday showed mild tapered stricture in the distal esophagus.  GI is consulted for further evaluation.  Patient seen and examined at bedside.  He is complaining of difficulty swallowing for last 3 days.  Complaining of choking sensation with coughing as well as sensation of food stuck in the lower esophagus.  Denies any blood in the stool or black stool.     Past Medical History:  Diagnosis Date   Arthritis    fingers (07/14/2018)   Depression    DVT (deep venous thrombosis) (HCC) LLE   Hepatitis C     finished harvoni tx ~ 2017   Hypercholesterolemia    Hypertension    Prostate cancer (HCC) 35yrs ago   Pulmonary embolism and infarction (HCC) 07/13/2018   Sleep apnea    not currently using cpap, mask causing vertigo   Type II diabetes mellitus (HCC)     Past Surgical History:  Procedure Laterality Date   BIOPSY  01/12/2019   Procedure: BIOPSY;  Surgeon: Donnald Charleston, MD;  [facility] ENDOSCOPY;  Service: Endoscopy;;   BIOPSY  06/23/2019   Procedure: BIOPSY;  Surgeon: Burnette Fallow, MD;  Location: [facility] ENDOSCOPY;  Service: Endoscopy;;   COLONOSCOPY WITH PROPOFOL  N/A 02/19/2014   Procedure: COLONOSCOPY WITH PROPOFOL ;  Surgeon: Gladis MARLA Louder, MD;  Location: [facility] ENDOSCOPY;  Service: Endoscopy;  Laterality: N/A;    ENDOVENOUS ABLATION SAPHENOUS VEIN W/ LASER Left 11/22/2017   endovenous laser ablation L SSV by Lynwood Collum MD    ESOPHAGEAL BRUSHING  06/23/2019   Procedure: ESOPHAGEAL BRUSHING;  Surgeon: Burnette Fallow, MD;  Location: [facility] ENDOSCOPY;  Service: Endoscopy;;   ESOPHAGOGASTRODUODENOSCOPY (EGD) WITH PROPOFOL  N/A 01/12/2019   Procedure: ESOPHAGOGASTRODUODENOSCOPY (EGD) WITH PROPOFOL ;  Surgeon: Donnald Charleston, MD;  Location: [facility] ENDOSCOPY;  Service: Endoscopy;  Laterality: N/A;  Patient is also scheduled for barium swallow; please notify radiology after patient's EGD is complete so that barium swallow follows the endoscopy, not vice versa   ESOPHAGOGASTRODUODENOSCOPY (EGD) WITH PROPOFOL  N/A 06/23/2019   Procedure: ESOPHAGOGASTRODUODENOSCOPY (EGD) WITH PROPOFOL ;  Surgeon: Burnette Fallow, MD;  Location[facility] ENDOSCOPY;  Service: Endoscopy;  Laterality: N/A;   PROSTATECTOMY  2008   REPAIR QUADRICEPS / HAMSTRING MUSCLE Right     Prior to Admission medications   Medication Sig Start Date End Date Taking? Authorizing Provider  ACETAMINOPHEN  PO Take 3-4 tablets by mouth every 6 (six) hours as needed for fever, headache or moderate pain (pain score 4-6).   Yes [provider]  HYDROcodone -acetaminophen  (NORCO) 10-325 MG tablet Take 1 tablet by mouth 4 (four) times daily as needed. 03/13/24  Yes [provider]  hydrOXYzine  (ATARAX ) 25 MG tablet Take 1 tablet (25 mg total) by mouth 3 (three) times daily as needed for anxiety. 10/28/23  Yes Samtani, Jai-Gurmukh, MD  pantoprazole  (PROTONIX ) 40 MG tablet Take 40 mg by mouth 2 (two) times daily. 12/26/23  Yes [provider]  sertraline  (ZOLOFT ) 100 MG tablet Take 200 mg by mouth daily.   Yes [provider]  vitamin B-12 (CYANOCOBALAMIN ) 500 MCG tablet Take 500-1,000 mcg by mouth daily.   Yes [provider]  allopurinol (ZYLOPRIM) 100 MG tablet Take 200 mg by mouth daily. Patient not taking: Reported on 10/11/2023 07/06/23    [provider]  diltiazem  (CARDIZEM  CD) 180 MG 24 hr capsule Take 1 capsule (180 mg total) by mouth daily. Patient not taking: Reported on 01/12/2024 10/28/23   Samtani, Jai-Gurmukh, MD  DULoxetine  (CYMBALTA ) 20 MG capsule Take 2 capsules (40 mg total) by mouth daily. Patient not taking: Reported on 01/12/2024 10/28/23   Samtani, Jai-Gurmukh, MD  melatonin 3 MG TABS tablet Take 3 mg by mouth at bedtime. Patient not taking: Reported on 04/04/2024    [provider]  trazodone  (DESYREL ) 300 MG tablet Take 300 mg by mouth at bedtime. Patient not taking: Reported on 01/12/2024    [provider]  Vitamin D, Ergocalciferol, (DRISDOL) 1.25 MG (50000 UNIT) CAPS capsule Take 50,000 Units by mouth every 7 (seven) days. Patient not taking: Reported on 08/08/2023 07/06/23   [provider]    Scheduled Meds:  acetaminophen   1,000 mg Oral Q8H   ARIPiprazole   10 mg Oral Daily   cyanocobalamin   500-1,000 mcg Oral Daily   diclofenac  Sodium  2 g Topical QID   enoxaparin  (LOVENOX ) injection  40 mg Subcutaneous Q24H   folic acid   1 mg Oral Daily   gabapentin   300 mg Oral TID   LORazepam   0-4 mg Intravenous Q12H   Or   LORazepam   0-4 mg Oral Q12H   melatonin  3 mg Oral QHS   metFORMIN   500 mg Oral BID WC   nicotine   14 mg Transdermal Daily   pantoprazole   40 mg Oral BID   polyethylene glycol  17 g Oral BID   sertraline   200 mg Oral Daily   thiamine   100 mg Oral Daily   Or   thiamine   100 mg Intravenous Daily   Continuous Infusions: PRN Meds:.albuterol , hydrOXYzine , metoprolol  tartrate, ondansetron  **OR** ondansetron  (ZOFRAN ) IV, oxyCODONE  **OR** oxyCODONE , polyethylene glycol  Allergies as of 04/04/2024   (No Known Allergies)    Family History  Problem Relation Age of Onset   Cancer Father        PROSTATE    Social History   Socioeconomic History   Marital status: Single    Spouse name: Not on file   Number of children: 2   Years of education: 12th    Highest education level: Not on file  Occupational History   Occupation: post office  Tobacco Use   Smoking status: Every Day    Current packs/day: 0.12    Average packs/day: 0.1 packs/day for 45.0 years (5.4 ttl pk-yrs)    Types: Cigarettes   Smokeless tobacco: Never  Vaping Use   Vaping status: Never Used  Substance and Sexual Activity   Alcohol use: Yes    Comment: Daily, reports drinking 2 forty ounce malt liquors for last 3 months staigh; reports problem with ETOH since 20's   Drug use: Not Currently    Types: Cocaine, Heroin, Oxycodone , Morphine , Fentanyl    Sexual activity: Not Currently  Other Topics Concern   Not on file  Social History Narrative   ** Merged History Encounter **       Patient lives at home alone.SABRASABRADrinks coffee daily    Social Drivers of Corporate investment banker  Strain: Low Risk  (10/27/2022)   Received from [facility]   Overall Financial Resource Strain (CARDIA)    Difficulty of Paying Living Expenses: Not very hard  Food Insecurity: Food Insecurity Present (04/07/2024)   Hunger Vital Sign    Worried About Running Out of Food in the Last Year: Sometimes true    Ran Out of Food in the Last Year: Sometimes true  Transportation Needs: Unmet Transportation Needs (04/07/2024)   PRAPARE - Administrator, Civil Service (Medical): Yes    Lack of Transportation (Non-Medical): Yes  Physical Activity: Inactive (10/27/2022)   Received from [facility]   Exercise Vital Sign    On average, how many days per week do you engage in moderate to strenuous exercise (like a brisk walk)?: 0 days    On average, how many minutes do you engage in exercise at this level?: 0 min  Stress: Stress Concern Present (10/27/2022)   Received from [facility] - Occupational Stress Questionnaire    Feeling of Stress : To some extent  Social Connections: Socially Isolated (04/07/2024)   Social Connection and  Isolation Panel    Frequency of Communication with Friends and Family: Once a week    Frequency of Social Gatherings with Friends and Family: Once a week    Attends Religious Services: Never    Database administrator or Organizations: No    Attends Banker Meetings: Never    Marital Status: Separated  Intimate Partner Violence: Not At Risk (04/07/2024)   Humiliation, Afraid, Rape, and Kick questionnaire    Fear of Current or Ex-Partner: No    Emotionally Abused: No    Physically Abused: No    Sexually Abused: No    Review of Systems: All negative except as stated above in HPI.  Physical Exam: Vital signs: Vitals:   04/07/24 0427 04/07/24 0837  BP: 129/84 (!) 136/90  Pulse: 68 68  Resp: 14 16  Temp: 98.7 F (37.1 C) 98.5 F (36.9 C)  SpO2: 95% 96%   Last BM Date : 04/05/24 General:   Alert,  Well-developed, well-nourished, pleasant and cooperative in NAD Lungs: No visible respiratory distress Heart:  Regular rate and rhythm; no murmurs, clicks, rubs,  or gallops. Abdomen: Soft, nontender, nondistended, bowel sound present, no peritoneal signs Mood and affect normal Alert and oriented x 3 Rectal:  Deferred  GI:  Lab Results: Recent Labs    04/05/24 0455 04/06/24 0413 04/07/24 0851  WBC 8.6 7.1 5.1  HGB 13.6 13.1 13.5  HCT 39.5 38.9* 40.0  PLT 287 261 256   BMET Recent Labs    04/05/24 1656 04/06/24 0413 04/07/24 0851  NA 127* 130* 130*  K 4.9 4.9 4.2  CL 88* 99 98  CO2 25 26 25   GLUCOSE 104* 85 98  BUN 14 16 15   CREATININE 1.41* 1.23 1.26*  CALCIUM  9.5 9.8 9.3   LFT Recent Labs    04/07/24 0851  PROT 7.0  ALBUMIN 2.9*  AST 15  ALT 10  ALKPHOS 38  BILITOT 0.6   PT/INR No results for input(s): LABPROT, INR in the last 72 hours.   Studies/Results: DG ESOPHAGUS W SINGLE CM (SOL OR THIN BA) Result Date: 04/06/2024 CLINICAL DATA:  67 year old male with globus sensation for diagnostic esophagram. EXAM: ESOPHAGUS/BARIUM  SWALLOW/TABLET STUDY TECHNIQUE: Single contrast examination was performed using thin liquid barium. This exam was performed by Abigail C. Augusta,  PA-C, and was supervised and interpreted by Dr. Toribio Faes. FLUOROSCOPY: Radiation Exposure Index (as provided by the fluoroscopic device): 71.5 mGy Kerma COMPARISON:  None Available. FINDINGS: Swallowing: Appears normal. No vestibular penetration or aspiration seen. Pharynx: Unremarkable. Esophagus: Mild tapered stricture in distal third of the esophagus just above gastroesophageal junction. Esophageal motility: Tertiary contractions visualized in distal third of the esophagus. Hiatal Hernia: Mild hiatal hernia visualized just prior to gastroesophageal junction. Gastroesophageal reflux: None visualized. Ingested 13mm barium tablet: Became stuck in the distal third of the esophagus, subsequent sips of water and thin barium were given without passage of tablet. Patient was informed tablet will dissolve. Other: Exam limited due to patient's inability to stand. IMPRESSION: Mild tapered stricture in distal third of the esophagus, tertiary contractions visualized in distal third of the esophagus, mild hiatal hernia visualized just prior to gastroesophageal junction. Performed By Lavanda Jurist, PA-C Electronically Signed   By: JONETTA Faes M.D.   On: 04/06/2024 14:26   DG Knee 1-2 Views Right Result Date: 04/06/2024 CLINICAL DATA:  Bilateral knee pain. EXAM: RIGHT KNEE - 1-2 VIEW COMPARISON:  12/22/2023 FINDINGS: Again seen patellar Baja. The joint spaces are normal. No fracture. Probable ossified intra-articular bodies in the suprapatellar space, chronic. No significant joint effusion. No erosive or bony destructive change. Chondrocalcinosis. Mild tricompartmental peripheral spurring. IMPRESSION: 1. Mild tricompartmental osteoarthritis with chondrocalcinosis. 2. Probable ossified intra-articular bodies in the suprapatellar space, chronic. 3. Patella baja. Electronically  Signed   By: Andrea Gasman M.D.   On: 04/06/2024 14:02   DG Knee 1-2 Views Left Result Date: 04/06/2024 CLINICAL DATA:  Bilateral knee pain. EXAM: LEFT KNEE - 1-2 VIEW COMPARISON:  01/11/2024 FINDINGS: Lateral tibiofemoral joint space narrowing. Mild to moderate tricompartmental peripheral spurring. Fragmented quadriceps enthesophyte versus ossified intra-articular body. Faint chondrocalcinosis. No fracture, erosion, or focal bone abnormality. Diminished joint effusion. Unremarkable soft tissues. IMPRESSION: 1. Mild to moderate tricompartmental osteoarthritis, most prominent in the lateral tibiofemoral compartment. 2. Fragmented quadriceps enthesophyte versus ossified intra-articular body. Electronically Signed   By: Andrea Gasman M.D.   On: 04/06/2024 14:01   MR LUMBAR SPINE WO CONTRAST Result Date: 04/05/2024 CLINICAL DATA:  Lumbar radiculopathy, symptoms persist with > 6 wks treatment Patient reports chronic pain medications with left testicular pain for approximately 2 months. EXAM: MRI LUMBAR SPINE WITHOUT CONTRAST TECHNIQUE: Multiplanar, multisequence MR imaging of the lumbar spine was performed. No intravenous contrast was administered. COMPARISON:  Abdominopelvic CT 04/04/2024. Chest CT 09/08/2018. Lumbar spine radiographs 12/03/2009. FINDINGS: Segmentation: Transitional lumbosacral anatomy. As correlated with prior CTs, there are 12 rib-bearing thoracic type vertebral bodies, 4 lumbar type vertebral bodies and a largely sacralized transitional L5 segment. Alignment:  Physiologic. Vertebrae: No worrisome osseous lesion, acute fracture or pars defect. Prominent hemangioma within the L3 vertebral body. Partially ankylosed paraspinal osteophytes in the lower thoracic spine. Mild sacroiliac degenerative changes bilaterally. Conus medullaris: Extends to the T12-L1 level. The conus and cauda equina appear normal. Paraspinal and other soft tissues: No significant paraspinal findings. Simple appearing  bilateral renal cysts for which no specific follow-up imaging is recommended. Stable 1.9 cm right adrenal nodule reported as a probable adenoma on prior CT, stable from 2020 chest CT. Disc levels: Sagittal images demonstrate paraspinal osteophytes in the lower thoracic spine but no evidence of spinal stenosis or significant foraminal narrowing. L1-2: Normal interspace. L2-3: Mild disc desiccation and bulging with preserved disc height. Mild facet hypertrophy. No spinal stenosis or foraminal narrowing. L3-4: Mild disc desiccation and bulging with preserved disc  height. Mild to moderate facet and ligamentous hypertrophy. No spinal stenosis or foraminal narrowing. L4-5: Preserved disc height with mild disc bulging and moderate to severe asymmetric right facet hypertrophy. No spinal stenosis or foraminal narrowing. L5-S1: As numbered, this is a vestigial disc space without acquired abnormality. IMPRESSION: 1. Transitional lumbosacral anatomy. There are 4 lumbar type vertebral bodies and a largely sacralized transitional L5 segment. 2. No acute findings or explanation for the patient's symptoms. 3. Mild disc bulging as described. No spinal stenosis or nerve root encroachment. Asymmetric right-sided facet hypertrophy at L5-S1. 4. Stable right adrenal nodule, reported as a probable adenoma on prior CT. Electronically Signed   By: Elsie Perone M.D.   On: 04/05/2024 14:52    Impression/Plan: -Dysphagia with abnormal esophagogram concerning for lower esophageal stricture. - Oropharyngeal dysphagia with constant coughing and choking sensation - History of DVT and PE.  Currently not on anticoagulation. - History of A-fib.  Recommendations ------------------------- - Patient is currently Not on anticoagulation. - Tentative plan for EGD with possible dilation tomorrow.  Keep n.p.o. past midnight. - He will benefit from speech therapy evaluation.  Risks (bleeding, infection, bowel perforation that could require  surgery, sedation-related changes in cardiopulmonary systems), benefits (identification and possible treatment of source of symptoms, exclusion of certain causes of symptoms), and alternatives (watchful waiting, radiographic imaging studies, empiric medical treatment)  were explained to patient/family in detail and patient wishes to proceed.    LOS: 3 days   Layla Lah  MD, FACP 04/07/2024, 12:27 PM  Contact #  [phone]

## 2024-04-07 NOTE — Consult Note (Signed)
 [facility] Consult Follow-up  Patient Name: .Todd [name].  MRN: [id number]  DOB: 1956/05/27  Consult Order details:  Orders (From admission, onward)     Start     Ordered   04/05/24 1052  IP CONSULT TO PSYCHIATRY       Comments: When asked if he was trying to hurt himself with trazodone , he said more or less, I didn't want to face the day  Ordering Provider: Perri DELENA Meliton [name]., MD  Provider:  (Not yet assigned)  Question Answer Comment  Location [facility]   Reason for Consult? suicidal ideation, taking 7-8 trazodone  a day      04/05/24 1052          Mode of Visit: In person   Psychiatry Consult Evaluation  Service Date: April 07, 2024 LOS:  LOS: 3 days  Chief Complaint:   Primary Psychiatric Diagnoses  Bipolar disorder, current episode mixed 2.  Ethanol use disorder 3.  Polysubstance use disorder (cocaine - in remission),   Assessment  Khani Paino. is a 68 y.o. male admitted: Presented to the ED for 04/04/2024  1:30 PM for testicular pain. He carries the psychiatric diagnoses of bipolar disorder, polysubstance use disorder (alcohol,  and has a past medical history of HTN, ulcerative esophagitis,  prostate cancer, BPPV, pulmonary embolus, heroin overdose, other opioid overdose, varicose veins, pulmonary nodule, SVT, RBBB, paroxysmal atrial flutter, congenital heart abnormality (unspecified),  His current presentation of persistently low mood, anergy, isolation, irritability, insomnia, increased substance use, with suicidal ideations and increasingly risky-medication use (trazodone  7+ pills nightly) is most consistent with bipolar disorder, current episode mixed. He meets criteria for bipolar disorder based on patient history, chart review.  Current outpatient psychotropic medications include sertraline  and trazodone  and historically he has had a mixed response to these medications. He was partially compliant with medications prior  to admission as evidenced by patient report. On initial examination, patient shared that he is not currently suicidal, but has had many of those thoughts over the last few weeks in the period of his worsening insomnia and depressive symptoms, leading him to take more and more alcohol and trazodone . Please see plan below for detailed recommendations.   04/07/2024: Patient seen face to face in his hospital room. He is awake, alert and oriented x 3. Patient reports depressed mood, lack of motivation, low energy level, racing thoughts, mood swings and sleep problem with frequent awakenings, but denies suicidal or homicidal ideation, intent or plan. Patient also denies delusions, hallucinations, thought insertion or thought broadcasting. However, patient reports ongoing abdominal pain for which he currently takes pain medication but states it seems not to be enough. Patient denies suicidal or homicidal ideation, intent or plan and denies any recent overdose saying I took multiple pills of Trazodone  because I could not sleep.   On further questioning about sleep, patient states he had sleep study done about 10 years ago which was negative for sleep apnea. However, patient reports he has gained significant amount of weight in the last few years and snores at night. As a result, this Clinical research associate encouraged the patient to repeat sleep study in the outpatient to rule out sleep apnea. Patient verbalized understanding.     Diagnoses:  Active Hospital problems: Principal Problem:   Hyponatremia Active Problems:   Non-insulin  dependent type 2 diabetes mellitus (HCC)   Affective bipolar disorder (HCC)   Primary hypertension   Alcohol abuse   Ulcerative esophagitis   Atrial flutter (HCC)  Acute bilateral knee pain   History of prostate cancer   Testicular pain, left    Plan   ## Psychiatric Medication Recommendations:  -- Continue sertraline  200 mg daily for depression  -- Continue abilify  10 mg daily for  mood stabilization -- Continue metformin  500mg  BID for antipsychotic-associated weight gain -- Continue folic acid  1 mg daily due to previous deficiency, poor po intake --Psychiatric consult service will continue to follow up for now.    ## Medical Decision Making Capacity: Not specifically addressed in this encounter  ## Further Work-up:  -- EKG TSH, B12, folate, EKG, or While pt on Qtc prolonging medications, please monitor & replete K+ to 4 and Mg2+ to 2 -- most recent EKG on 8/21 had QtC of 493 (with bifascicular block) -- Pertinent labwork reviewed earlier this admission includes: cbc, bmp, A1c   ## Disposition:-- Plan Post Discharge/Psychiatric Care Follow-up resources will request psychiatric follow-up care with [facility] or his social worker  ## Behavioral / Environmental: - No specific recommendations at this time.     ## Safety and Observation Level:  - Based on my clinical evaluation, I estimate the patient to be at low risk of self harm in the current setting. - At this time, we recommend  routine. This decision is based on my review of the chart including patient's history and current presentation, interview of the patient, mental status examination, and consideration of suicide risk including evaluating suicidal ideation, plan, intent, suicidal or self-harm behaviors, risk factors, and protective factors. This judgment is based on our ability to directly address suicide risk, implement suicide prevention strategies, and develop a safety plan while the patient is in the clinical setting. Please contact our team if there is a concern that risk level has changed.  CSSR Risk Category:C-SSRS RISK CATEGORY: High Risk  Suicide Risk Assessment: Patient has following modifiable risk factors for suicide: under treated depression , social isolation, recklessness, medication noncompliance, active mental illness (to encompass adhd, tbi, mania, psychosis, trauma reaction), current symptoms:  anxiety/panic, insomnia, impulsivity, anhedonia, hopelessness, and pain, medical illness (ie new dx of cancer), which we are addressing by starting a mood stabilizer, connecting him with outpatient psychiatric resources..  Patient has following non-modifiable or demographic risk factors for suicide: male gender, separation or divorce, history of suicide attempt, history of self harm behavior, and psychiatric hospitalization Patient has the following protective factors against suicide: Cultural, spiritual, or religious beliefs that discourage suicide  Thank you for this consult request. Recommendations have been communicated to the primary team.  We will continue to follow up at this time.   Jan DELENA Donath, MD       History of Present Illness  Relevant Aspects of Hospital Course:  Admitted on 04/04/2024 for knee pain, testicular pain, sore throat. They were found to have significant hyponatremia.   Patient Report:  04/06/2024 Patient continues to endorse poor mood unchanged from yesterday, however continues to endorse strong will and desire to improve.  We reviewed risks and benefits of Abilify  as well as side effects, patient denies having seen any side effects but also denies seeing any benefit from the Abilify  so far.  Patient agrees to increase in medication.  Aims 0.  Negative for cogwheeling or tremors.  Continues with passive SI, denies active SI, denies HI, auditory or visual hallucinations.   04/05/2024 Met patient early afternoon bedside, he was pleasant and agreeable to interview. He reports 6 weeks of worsening depressive mood symptoms including feelings of guilt, worthlessness,  passive suicidal ideation, rumination on past mistakes, with low energy, anhedonia, and increased use of substances (alcohol). He also reports a concerning pattern of severe insomnia with approximately one month of reduced sleep (2-3 hours per night) despite larger and larger doses of trazodone . He reports  passive suicidal ideation over the last month due to his worsening overall health, his social situation (isolated from friends, family, community of faith), and his living situation.  He reports that he was taking 7-8 pills of trazodone  300 mg in order to get some rest, but he was aware that dosage like this might be dangerous. He denies current SI, HI, AVH.  Psych ROS:  Depression: guilt, worthlessness, passive suicidal ideation, rumination on past mistakes, with low energy, anhedonia, and increased use of substances (alcohol) Anxiety:  denies Mania (lifetime and current): denies grandiosity, pressured speech, endorses irritability, poor concentration, severe reductions in sleep. Psychosis: (lifetime and current): denies current, not seen responding to internal stimuli  Collateral information:  Contacted Nat Chang (case worker) at [phone] on 04/05/2024, 1300, left VM  Collateral information:  Contacted Arnet Hofferber (spouse, separated, [phone]) on 04/05/2024, 1310 -1325. She has had a stroke and has some difficulty making herself understood on the phone.  Married for 10 years separated for 5 years. She lives in [facility], talks with patient 2-3 times per week. They have remained very close. She is concerned about the patient. Last saw him in person around Father's day when she stayed for several weeks.  Patient has been grieving due to the death of his father on Mar 23, 2024, sister, and a friend. Medical situation - she went with him to several appointments, she saw pt with his PCP and orthopedist.  No history of AVH. Strong history of insomnia. History of chronic pain. 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 History of involvement in AA, but none currently.  Patient was sober, but fell off the wagon and has had drinking problems and substance use problems for many years. Denies statements of self-harm, denies  firearms, does endorse many pills at home.    Review of Systems  Musculoskeletal:  Positive for joint pain.  Psychiatric/Behavioral:  Positive for depression, substance abuse and suicidal ideas. The patient has insomnia.      Psychiatric and Social History  Psychiatric History:  Information collected from chart review, patient  Prev Dx/Sx: Bipolar disorder, alcohol use disorder, opioid use disorder, stimulant use disorder (cocaine) Current Psych Provider: None Home Meds (current): sertraline , does not remember other medications Previous Med Trials: depakote , sertraline150 mg, mirtazapine, olanzapine, sertraline , trazodone ,  Therapy: none current  Prior Psych Hospitalization: Last hospitalization 04/21/2023  Prior Self Harm: yes. Frequent history of suicidal ideation with some plans. Prior Violence: denies, spouse denies  Family Psych History:  Family Hx suicide: denies  Social History:  Developmental Hx: Grew up in [place] , has been in [place] for 30 years Educational Hx: finished high school Occupational Hx: former Energy manager, former Academic librarian. Legal Hx: denies Living Situation: lives in subsidized housing Spiritual Hx: christian, baptist. Access to weapons/lethal means: denies firearms access. Has access to drugs of abuse    Substance use history: Alcohol: daily Type of alcohol: beer Last Drink: days before admission Number of drinks per day: not sure, more than a few History of alcohol withdrawal seizures: denies History of DT's: denies Tobacco: 5.4 pack-year smoking history.  Illicit drugs:  Marijuana/THC: past  Cocaine: yes, past Methamphetamines: no Benzodiazepines: no Opioids: yes, prescribed oxycodone  for pay Hallucinogens: no Bath salts: no Prescription Drug abuse problems:  Exam Findings  Physical Exam:  Vital Signs:  Temp:  [98.4 F (36.9 C)-98.8 F (37.1 C)] 98.5 F (36.9 C) (08/23 0837) Pulse Rate:  [66-71] 68 (08/23 0837) Resp:   [14-16] 16 (08/23 0837) BP: (119-136)/(83-90) 136/90 (08/23 0837) SpO2:  [95 %-98 %] 96 % (08/23 0837) Blood pressure (!) 136/90, pulse 68, temperature 98.5 F (36.9 C), temperature source Oral, resp. rate 16, height 5' 8 (1.727 m), weight 75.8 kg, SpO2 96%. Body mass index is 25.41 kg/m.  Physical Exam Vitals reviewed.  Constitutional:      Appearance: He is obese. He is ill-appearing.  HENT:     Head: Normocephalic and atraumatic.  Pulmonary:     Breath sounds: Rhonchi present.  Skin:    General: Skin is warm.  Neurological:     Mental Status: He is oriented to person, place, and time.     Mental Status Exam  Apperance: Appropriate for environment and Laying in bed Behavior: Calm Speech: Normal Rate, Articulate, Normal Volume, and Responsive Attitude: Cooperative and Friendly Mood: not well Affect: DEPRESSED, Normal Range, and Mood Congruent Perception: Not responding to internal stimuli Thought Content: within normal limits Thought Form: Goal Directed, Organized, Linear, and Logical Cognition: Alert & Oriented to person, place, and time, Recent and Remote memory grossly intact by recounting personal history, and Immediate memory grossly intact by interview Judgment: Fair Insight: Fair      Other History   These have been pulled in through the EMR, reviewed, and updated if appropriate.  Family History:  The patient's family history includes Cancer in his father.  Medical History: Past Medical History:  Diagnosis Date   Arthritis    fingers (07/14/2018)   Depression    DVT (deep venous thrombosis) (HCC) LLE   Hepatitis C     finished harvoni tx ~ 2017   Hypercholesterolemia    Hypertension    Prostate cancer (HCC) 49yrs ago   Pulmonary embolism and infarction (HCC) 07/13/2018   Sleep apnea    not currently using cpap, mask causing vertigo   Type II diabetes mellitus (HCC)     Surgical History: Past Surgical History:  Procedure Laterality Date    BIOPSY  01/12/2019   Procedure: BIOPSY;  Surgeon: Donnald Charleston, MD;  Location: [facility] ENDOSCOPY;  Service: Endoscopy;;   BIOPSY  06/23/2019   Procedure: BIOPSY;  Surgeon: Burnette Fallow, MD;  Location[facility] ENDOSCOPY;  Service: Endoscopy;;   COLONOSCOPY WITH PROPOFOL  N/A 02/19/2014   Procedure: COLONOSCOPY WITH PROPOFOL ;  Surgeon: Gladis MARLA Louder, MD;  Location: [facility] ENDOSCOPY;  Service: Endoscopy;  Laterality: N/A;   ENDOVENOUS ABLATION SAPHENOUS VEIN W/ LASER Left 11/22/2017   endovenous laser ablation L SSV by Lynwood Collum MD    ESOPHAGEAL BRUSHING  06/23/2019   Procedure: ESOPHAGEAL BRUSHING;  Surgeon: Burnette Fallow, MD;  Location: [facility] ENDOSCOPY;  Service: Endoscopy;;   ESOPHAGOGASTRODUODENOSCOPY (EGD) WITH PROPOFOL  N/A 01/12/2019   Procedure: ESOPHAGOGASTRODUODENOSCOPY (EGD) WITH PROPOFOL ;  Surgeon: Donnald Charleston, MD;  Location: [facility] ENDOSCOPY;  Service: Endoscopy;  Laterality: N/A;  Patient is also scheduled for barium swallow; please notify radiology after patient's EGD is complete so that barium swallow follows the endoscopy, not vice versa   ESOPHAGOGASTRODUODENOSCOPY (EGD) WITH PROPOFOL  N/A 06/23/2019   Procedure: ESOPHAGOGASTRODUODENOSCOPY (EGD) WITH PROPOFOL ;  Surgeon: Burnette Fallow, MD;  Location: [facility] ENDOSCOPY;  Service: Endoscopy;  Laterality: N/A;  PROSTATECTOMY  2008   REPAIR QUADRICEPS / HAMSTRING MUSCLE Right      Medications:   Current Facility-Administered Medications:    acetaminophen  (TYLENOL ) tablet 1,000 mg, 1,000 mg, Oral, Q8H, Perri DELENA Meliton Mickey., MD, 1,000 mg at 04/06/24 2232   albuterol  (PROVENTIL ) (2.5 MG/3ML) 0.083% nebulizer solution 2.5 mg, 2.5 mg, Nebulization, Q2H PRN, Fredirick Glenys RAMAN, MD   ARIPiprazole  (ABILIFY ) tablet 10 mg, 10 mg, Oral, Daily, Prunty, Donald B, DO, 10 mg at 04/07/24 0900   cyanocobalamin  (VITAMIN B12) tablet 500-1,000 mcg, 500-1,000 mcg, Oral, Daily, Pratt, Tanya S, MD, 1,000 mcg at 04/07/24 0900   diclofenac  Sodium (VOLTAREN ) 1 % topical  gel 2 g, 2 g, Topical, QID, Perri DELENA Meliton Mickey., MD, 2 g at 04/07/24 0920   enoxaparin  (LOVENOX ) injection 40 mg, 40 mg, Subcutaneous, Q24H, Fredirick Glenys RAMAN, MD, 40 mg at 04/07/24 9089   folic acid  (FOLVITE ) tablet 1 mg, 1 mg, Oral, Daily, Delsie Lynwood Morene Lavone, MD, 1 mg at 04/07/24 0900   gabapentin  (NEURONTIN ) capsule 300 mg, 300 mg, Oral, TID, Perri DELENA Meliton Mickey., MD, 300 mg at 04/07/24 0900   hydrOXYzine  (ATARAX ) tablet 25 mg, 25 mg, Oral, TID PRN, Fredirick Glenys RAMAN, MD, 25 mg at 04/06/24 1008   LORazepam  (ATIVAN ) injection 0-4 mg, 0-4 mg, Intravenous, Q12H **OR** LORazepam  (ATIVAN ) tablet 0-4 mg, 0-4 mg, Oral, Q12H, Pfeiffer, Marcy, MD   melatonin tablet 3 mg, 3 mg, Oral, QHS, Fredirick Glenys RAMAN, MD, 3 mg at 04/06/24 2232   metFORMIN  (GLUCOPHAGE ) tablet 500 mg, 500 mg, Oral, BID WC, Prunty, Donald B, DO   metoprolol  tartrate (LOPRESSOR ) injection 5 mg, 5 mg, Intravenous, Q6H PRN, Fredirick Glenys RAMAN, MD   nicotine  (NICODERM CQ  - dosed in mg/24 hours) patch 14 mg, 14 mg, Transdermal, Daily, Fredirick Glenys RAMAN, MD, 14 mg at 04/07/24 1000   ondansetron  (ZOFRAN ) tablet 4 mg, 4 mg, Oral, Q6H PRN **OR** ondansetron  (ZOFRAN ) injection 4 mg, 4 mg, Intravenous, Q6H PRN, Fredirick Glenys RAMAN, MD   oxyCODONE  (Oxy IR/ROXICODONE ) immediate release tablet 5 mg, 5 mg, Oral, Q4H PRN **OR** oxyCODONE  (Oxy IR/ROXICODONE ) immediate release tablet 10 mg, 10 mg, Oral, Q4H PRN, Perri DELENA Meliton Mickey., MD, 10 mg at 04/07/24 9171   pantoprazole  (PROTONIX ) EC tablet 40 mg, 40 mg, Oral, BID, Pratt, Tanya S, MD, 40 mg at 04/07/24 0900   polyethylene glycol (MIRALAX  / GLYCOLAX ) packet 17 g, 17 g, Oral, Daily PRN, Fredirick Glenys RAMAN, MD, 17 g at 04/06/24 1009   polyethylene glycol (MIRALAX  / GLYCOLAX ) packet 17 g, 17 g, Oral, BID, Perri DELENA Meliton Mickey., MD   sertraline  (ZOLOFT ) tablet 200 mg, 200 mg, Oral, Daily, Fredirick Glenys RAMAN, MD, 200 mg at 04/07/24 0900   thiamine  (VITAMIN B1) tablet 100 mg, 100 mg, Oral, Daily, 100 mg at 04/07/24  0900 **OR** thiamine  (VITAMIN B1) injection 100 mg, 100 mg, Intravenous, Daily, Fredirick Glenys RAMAN, MD  Allergies: No Known Allergies  Jan DELENA Donath, MD

## 2024-04-07 NOTE — Progress Notes (Signed)
 Mobility Specialist Progress Note:   04/07/24 1203  Mobility  Activity Ambulated with assistance  Level of Assistance Minimal assist, patient does 75% or more  Assistive Device Front wheel walker  Distance Ambulated (ft) 20 ft  Activity Response Tolerated well  Mobility Referral Yes  Mobility visit 1 Mobility  Mobility Specialist Start Time (ACUTE ONLY) [id number]  Mobility Specialist Stop Time (ACUTE ONLY) 1013  Mobility Specialist Time Calculation (min) (ACUTE ONLY) 21 min   Pt received in bed agreeable to mobility. Pt c/o abdominal pain, otherwise tolerated well. Distance limited d/t fatigue. Returned to bed w/o fault. Left in bed w/ call bell and personal belongings in reach. All needs met.  Thersia Minder Mobility Specialist  Please contact vis Secure Chat or  Rehab Office [phone]

## 2024-04-07 NOTE — Progress Notes (Signed)
 RN spoke with Phlebotomy regarding lab draw for Cortrosyn  and was told that it needs to be done on dayshift d/t morning collections. RN was told that day RN needs to call lab when she is ready because the first draw has to be done 30 minutes prior to administration.   Bari HERO Todd [name]

## 2024-04-07 NOTE — Progress Notes (Signed)
 PROGRESS NOTE    Todd [name].  FMW:[id number] DOB: Oct 29, 1955 DOA: 04/04/2024 PCP: Todd Trula SQUIBB, MD  Chief Complaint  Patient presents with   Knee Pain   Testicle Pain   Sore Throat    Brief Narrative:   Todd [name]. is Todd [name] 68 y.o. male with medical history significant of paroxysmal Todd [name] flutter, HTN, CHFpEF, polysubstance use, who admits to drinking at least 1 beer daily and reports being on chronic pain meds daily who presents to the ED with testicular pain on the left.  He notes the pains been there approximately 2 months.  He also reports bilateral knee pain.  He has some epigastric pain and history of esophageal ulcers though he denies hematemesis dark stools.  In the ED he had Todd [name] negative scrotal Doppler and they noted no significant knee swelling.  Labs were significant for hyponatremia with sodium of 121.  We were asked to admit for hyponatremia.  Per patient he has had very limited food over the last 3 days.  He states the place he is living is not feeding him.   Assessment & Plan:   Principal Problem:   Hyponatremia Active Problems:   Non-insulin  dependent type 2 diabetes mellitus (HCC)   Affective bipolar disorder (HCC)   Primary hypertension   Alcohol abuse   Ulcerative esophagitis   Atrial flutter (HCC)   Acute bilateral knee pain   History of prostate cancer   Testicular pain, left  Dysphagia SLP eval - dysphagia 3, thin - appreciate further SLP recs Esophagram with mild tapered stricture in distal third of the esophagus, tertiary contractions in the distal third of the esophagus, mild hiatal hernia prior to GE junction GI c/s, appreciate assistance  Abdominal Pain CT 8/20 with possible ileus vs early low grade SBO Today is the first day he's c/o pain, unclear when last BM - he's passing flatus.  Follow lipase Follow KUB, consider additional imaging/workup as indicated  Hyponatremia Suspect multifactorial.  History suggest poor PO intake and suspected  hypovolemia.  Currently appears euvolemic.  Also, is on psychiatric meds that could cause SIADH (zoloft , trazodone  - he was taking trazodone  inappropriately).   TSH wnl.  Nondiagnostic cortisol.  ACTH  stim wnl. Na is improving, will continue to monitor. CXR 8/21 showed congestion with diffuse interstitial opacity suggesting edema  Strict I/O, daily weights   Acute bilateral knee pain Chronic Pain Add gabapentin  to regimen - increase as tolerated.  Scheduled APAP.  Voltaren . Continue oxycodone  (on chronic norco at home)  Plain films today Long discussion - we won't likely significantly improve his chronic pain here - consider trial of steroids? Will continue this  Left Testicular and Left Leg Pain Describes on and off pain for Todd [name] year, no pain in back, but occasionally describes numbness to buttocks or scrotum.   CT abd/pelvis without explanation  Scrotal US  without acute abnormality - small R and trace L hydroceles MRI without acute findings or explanation for symptoms - mild disc bulging.  No spinal stenosis or nerve root encroachment.  Asymmetric R sided facet hypertrophy at L5-S1.  No c/o of pain today   Alcohol abuse CIWA protocol Patient denies any history of DTs or seizures with not drinking.  HFpEF CXR concerning for edema Echo with EF 55-60% Clinically, doesn't appear overloaded - no crackles on exam, no edema Will monitor for now  Strict I/O, daily weights  Atrial flutter (HCC) Continue beta-blocker Not on anticoagulation    Ulcerative esophagitis PPI   History  of prostate cancer Status post prostatectomy  Primary hypertension Continue diltiazem  Lopressor  as needed   Affective bipolar disorder (HCC) Zoloft , abilify  - metformin  per psych   Hx Folate Def Folic acid   Non-insulin  dependent type 2 diabetes mellitus (HCC) Currently on no meds Carb modified diet Check A1c CBGs    DVT prophylaxis: lovenox  Code Status: ful Family Communication:  none Disposition:   Status is: Inpatient Remains inpatient appropriate because: need for continued inpatient care   Consultants:  psych  Procedures:  none  Antimicrobials:  Anti-infectives (From admission, onward)    None       Subjective: C/o chronic knee pain C/o epigastric pain   Objective: Vitals:   04/06/24 1408 04/06/24 1942 04/07/24 0427 04/07/24 0837  BP: 119/83 125/89 129/84 (!) 136/90  Pulse: 66 71 68 68  Resp: 16 15 14 16   Temp: 98.4 F (36.9 C) 98.8 F (37.1 C) 98.7 F (37.1 C) 98.5 F (36.9 C)  TempSrc: Rectal Oral Oral Oral  SpO2: 98% 95% 95% 96%  Weight:      Height:        Intake/Output Summary (Last 24 hours) at 04/07/2024 1315 Last data filed at 04/07/2024 0500 Gross per 24 hour  Intake 240 ml  Output 200 ml  Net 40 ml   Filed Weights   04/04/24 1334  Weight: 75.8 kg    Examination:  General: No acute distress. Cardiovascular: RRR Lungs: unlabored Abdomen: mildly distended, TTP in epigastric region Neurological: Alert and oriented 3. Moves all extremities 4 with equal strength. Cranial nerves II through XII grossly intact. Extremities: No clubbing or cyanosis. No edema.   Data Reviewed: I have personally reviewed following labs and imaging studies  CBC: Recent Labs  Lab 04/04/24 1405 04/05/24 0455 04/06/24 0413 04/07/24 0851  WBC 10.6* 8.6 7.1 5.1  NEUTROABS 8.3*  --  5.0 3.1  HGB 16.3 13.6 13.1 13.5  HCT 47.0 39.5 38.9* 40.0  MCV 91.8 91.9 93.5 93.7  PLT 335 287 261 256    Basic Metabolic Panel: Recent Labs  Lab 04/05/24 0455 04/05/24 0854 04/05/24 1656 04/06/24 0413 04/07/24 0851  NA 123* 126* 127* 130* 130*  K 5.0 4.8 4.9 4.9 4.2  CL 88* 90* 88* 99 98  CO2 24 26 25 26 25   GLUCOSE 95 95 104* 85 98  BUN 12 12 14 16 15   CREATININE 1.38* 1.19 1.41* 1.23 1.26*  CALCIUM  9.1 9.2 9.5 9.8 9.3  MG  --   --   --  1.9 1.8  PHOS  --   --   --  3.0 3.7    GFR: Estimated Creatinine Clearance: 54.3 mL/min (Todd [name]) (by  C-G formula based on SCr of 1.26 mg/dL (H)).  Liver Function Tests: Recent Labs  Lab 04/04/24 1405 04/05/24 0455 04/06/24 0413 04/07/24 0851  AST 25 20 17 15   ALT 11 10 10 10   ALKPHOS 52 37* 38 38  BILITOT 0.7 0.6 0.8 0.6  PROT 8.4* 6.9 6.9 7.0  ALBUMIN 3.5 3.0* 2.9* 2.9*    CBG: Recent Labs  Lab 04/06/24 1149 04/06/24 1656 04/06/24 2118 04/07/24 0557 04/07/24 1138  GLUCAP 109* 159* 111* 90 140*     No results found for this or any previous visit (from the past 240 hours).       Radiology Studies: DG ESOPHAGUS W SINGLE CM (SOL OR THIN BA) Result Date: 04/06/2024 CLINICAL DATA:  68 year old male with globus sensation for diagnostic esophagram. EXAM: ESOPHAGUS/BARIUM SWALLOW/TABLET STUDY TECHNIQUE: Single contrast  examination was performed using thin liquid barium. This exam was performed by Abigail C. Augusta, PA-C, and was supervised and interpreted by Dr. Toribio Faes. FLUOROSCOPY: Radiation Exposure Index (as provided by the fluoroscopic device): 71.5 mGy Kerma COMPARISON:  None Available. FINDINGS: Swallowing: Appears normal. No vestibular penetration or aspiration seen. Pharynx: Unremarkable. Esophagus: Mild tapered stricture in distal third of the esophagus just above gastroesophageal junction. Esophageal motility: Tertiary contractions visualized in distal third of the esophagus. Hiatal Hernia: Mild hiatal hernia visualized just prior to gastroesophageal junction. Gastroesophageal reflux: None visualized. Ingested 13mm barium tablet: Became stuck in the distal third of the esophagus, subsequent sips of water and thin barium were given without passage of tablet. Patient was informed tablet will dissolve. Other: Exam limited due to patient's inability to stand. IMPRESSION: Mild tapered stricture in distal third of the esophagus, tertiary contractions visualized in distal third of the esophagus, mild hiatal hernia visualized just prior to gastroesophageal junction. Performed  By Lavanda Augusta, PA-C Electronically Signed   By: JONETTA Faes M.D.   On: 04/06/2024 14:26   DG Knee 1-2 Views Right Result Date: 04/06/2024 CLINICAL DATA:  Bilateral knee pain. EXAM: RIGHT KNEE - 1-2 VIEW COMPARISON:  12/22/2023 FINDINGS: Again seen patellar [place]. The joint spaces are normal. No fracture. Probable ossified intra-articular bodies in the suprapatellar space, chronic. No significant joint effusion. No erosive or bony destructive change. Chondrocalcinosis. Mild tricompartmental peripheral spurring. IMPRESSION: 1. Mild tricompartmental osteoarthritis with chondrocalcinosis. 2. Probable ossified intra-articular bodies in the suprapatellar space, chronic. 3. Patella baja. Electronically Signed   By: Andrea Gasman M.D.   On: 04/06/2024 14:02   DG Knee 1-2 Views Left Result Date: 04/06/2024 CLINICAL DATA:  Bilateral knee pain. EXAM: LEFT KNEE - 1-2 VIEW COMPARISON:  01/11/2024 FINDINGS: Lateral tibiofemoral joint space narrowing. Mild to moderate tricompartmental peripheral spurring. Fragmented quadriceps enthesophyte versus ossified intra-articular body. Faint chondrocalcinosis. No fracture, erosion, or focal bone abnormality. Diminished joint effusion. Unremarkable soft tissues. IMPRESSION: 1. Mild to moderate tricompartmental osteoarthritis, most prominent in the lateral tibiofemoral compartment. 2. Fragmented quadriceps enthesophyte versus ossified intra-articular body. Electronically Signed   By: Andrea Gasman M.D.   On: 04/06/2024 14:01   MR LUMBAR SPINE WO CONTRAST Result Date: 04/05/2024 CLINICAL DATA:  Lumbar radiculopathy, symptoms persist with > 6 wks treatment Patient reports chronic pain medications with left testicular pain for approximately 2 months. EXAM: MRI LUMBAR SPINE WITHOUT CONTRAST TECHNIQUE: Multiplanar, multisequence MR imaging of the lumbar spine was performed. No intravenous contrast was administered. COMPARISON:  Abdominopelvic CT 04/04/2024. Chest CT 09/08/2018.  Lumbar spine radiographs 12/03/2009. FINDINGS: Segmentation: Transitional lumbosacral anatomy. As correlated with prior CTs, there are 12 rib-bearing thoracic type vertebral bodies, 4 lumbar type vertebral bodies and Camilo Mander largely sacralized transitional L5 segment. Alignment:  Physiologic. Vertebrae: No worrisome osseous lesion, acute fracture or pars defect. Prominent hemangioma within the L3 vertebral body. Partially ankylosed paraspinal osteophytes in the lower thoracic spine. Mild sacroiliac degenerative changes bilaterally. Conus medullaris: Extends to the T12-L1 level. The conus and cauda equina appear normal. Paraspinal and other soft tissues: No significant paraspinal findings. Simple appearing bilateral renal cysts for which no specific follow-up imaging is recommended. Stable 1.9 cm right adrenal nodule reported as Geeta Dworkin probable adenoma on prior CT, stable from 2020 chest CT. Disc levels: Sagittal images demonstrate paraspinal osteophytes in the lower thoracic spine but no evidence of spinal stenosis or significant foraminal narrowing. L1-2: Normal interspace. L2-3: Mild disc desiccation and bulging with preserved disc height. Mild facet hypertrophy.  No spinal stenosis or foraminal narrowing. L3-4: Mild disc desiccation and bulging with preserved disc height. Mild to moderate facet and ligamentous hypertrophy. No spinal stenosis or foraminal narrowing. L4-5: Preserved disc height with mild disc bulging and moderate to severe asymmetric right facet hypertrophy. No spinal stenosis or foraminal narrowing. L5-S1: As numbered, this is Dashon Mcintire vestigial disc space without acquired abnormality. IMPRESSION: 1. Transitional lumbosacral anatomy. There are 4 lumbar type vertebral bodies and Kyser Wandel largely sacralized transitional L5 segment. 2. No acute findings or explanation for the patient's symptoms. 3. Mild disc bulging as described. No spinal stenosis or nerve root encroachment. Asymmetric right-sided facet hypertrophy at L5-S1.  4. Stable right adrenal nodule, reported as Lindsay Soulliere probable adenoma on prior CT. Electronically Signed   By: Elsie Perone M.D.   On: 04/05/2024 14:52        Scheduled Meds:  acetaminophen   1,000 mg Oral Q8H   ARIPiprazole   10 mg Oral Daily   cyanocobalamin   500-1,000 mcg Oral Daily   diclofenac  Sodium  2 g Topical QID   enoxaparin  (LOVENOX ) injection  40 mg Subcutaneous Q24H   folic acid   1 mg Oral Daily   gabapentin   300 mg Oral TID   LORazepam   0-4 mg Intravenous Q12H   Or   LORazepam   0-4 mg Oral Q12H   melatonin  3 mg Oral QHS   metFORMIN   500 mg Oral BID WC   nicotine   14 mg Transdermal Daily   pantoprazole   40 mg Oral BID   polyethylene glycol  17 g Oral BID   sertraline   200 mg Oral Daily   thiamine   100 mg Oral Daily   Or   thiamine   100 mg Intravenous Daily   Continuous Infusions:   LOS: 3 days    Time spent: over 30 min    Meliton Monte, MD [facility]   To contact the attending provider between 7A-7P or the covering provider during after hours 7P-7A, please log into the web site [email or web address] and access using universal [place] password for that web site. If you do not have the password, please call the hospital operator.  04/07/2024, 1:15 PM

## 2024-04-07 NOTE — Plan of Care (Signed)
  Problem: Education: Goal: Knowledge of General Education information will improve Description: Including pain rating scale, medication(s)/side effects and non-pharmacologic comfort measures Outcome: Progressing   Problem: Activity: Goal: Risk for activity intolerance will decrease Outcome: Progressing   Problem: Pain Managment: Goal: General experience of comfort will improve and/or be controlled Outcome: Progressing

## 2024-04-07 NOTE — Consult Note (Signed)
 Referring Provider: TH Primary Care Physician:  Dwight Trula SQUIBB, MD Primary Gastroenterologist: Margarete primary  Reason for Consultation: Dysphagia, esophageal stricture  HPI: Todd [name]. is a 68 y.o. male with past medical history of polysubstance abuse with history of cocaine and heroin use, history of bipolar disorder, history of paroxysmal atrial fibrillation, history of DVT and PE NOT on anticoagulation presented to the hospital with testicular pain.  Upon initial workup, he was found to have significant hyponatremia with sodium count 121.  Patient subsequently started complaining about poor oral intake and dysphagia for last few days.  Barium swallow yesterday showed mild tapered stricture in the distal esophagus.  GI is consulted for further evaluation.  Patient seen and examined at bedside.  He is complaining of difficulty swallowing for last 3 days.  Complaining of choking sensation with coughing as well as sensation of food stuck in the lower esophagus.  Denies any blood in the stool or black stool.     Past Medical History:  Diagnosis Date   Arthritis    fingers (07/14/2018)   Depression    DVT (deep venous thrombosis) (HCC) LLE   Hepatitis C     finished harvoni tx ~ 2017   Hypercholesterolemia    Hypertension    Prostate cancer (HCC) 35yrs ago   Pulmonary embolism and infarction (HCC) 07/13/2018   Sleep apnea    not currently using cpap, mask causing vertigo   Type II diabetes mellitus (HCC)     Past Surgical History:  Procedure Laterality Date   BIOPSY  01/12/2019   Procedure: BIOPSY;  Surgeon: Donnald Charleston, MD;  [facility] ENDOSCOPY;  Service: Endoscopy;;   BIOPSY  06/23/2019   Procedure: BIOPSY;  Surgeon: Burnette Fallow, MD;  Location: [facility] ENDOSCOPY;  Service: Endoscopy;;   COLONOSCOPY WITH PROPOFOL  N/A 02/19/2014   Procedure: COLONOSCOPY WITH PROPOFOL ;  Surgeon: Gladis MARLA Louder, MD;  Location: [facility] ENDOSCOPY;  Service: Endoscopy;  Laterality: N/A;    ENDOVENOUS ABLATION SAPHENOUS VEIN W/ LASER Left 11/22/2017   endovenous laser ablation L SSV by Lynwood Collum MD    ESOPHAGEAL BRUSHING  06/23/2019   Procedure: ESOPHAGEAL BRUSHING;  Surgeon: Burnette Fallow, MD;  Location: [facility] ENDOSCOPY;  Service: Endoscopy;;   ESOPHAGOGASTRODUODENOSCOPY (EGD) WITH PROPOFOL  N/A 01/12/2019   Procedure: ESOPHAGOGASTRODUODENOSCOPY (EGD) WITH PROPOFOL ;  Surgeon: Donnald Charleston, MD;  Location: [facility] ENDOSCOPY;  Service: Endoscopy;  Laterality: N/A;  Patient is also scheduled for barium swallow; please notify radiology after patient's EGD is complete so that barium swallow follows the endoscopy, not vice versa   ESOPHAGOGASTRODUODENOSCOPY (EGD) WITH PROPOFOL  N/A 06/23/2019   Procedure: ESOPHAGOGASTRODUODENOSCOPY (EGD) WITH PROPOFOL ;  Surgeon: Burnette Fallow, MD;  Location[facility] ENDOSCOPY;  Service: Endoscopy;  Laterality: N/A;   PROSTATECTOMY  2008   REPAIR QUADRICEPS / HAMSTRING MUSCLE Right     Prior to Admission medications   Medication Sig Start Date End Date Taking? Authorizing Provider  ACETAMINOPHEN  PO Take 3-4 tablets by mouth every 6 (six) hours as needed for fever, headache or moderate pain (pain score 4-6).   Yes [provider]  HYDROcodone -acetaminophen  (NORCO) 10-325 MG tablet Take 1 tablet by mouth 4 (four) times daily as needed. 03/13/24  Yes [provider]  hydrOXYzine  (ATARAX ) 25 MG tablet Take 1 tablet (25 mg total) by mouth 3 (three) times daily as needed for anxiety. 10/28/23  Yes Samtani, Jai-Gurmukh, MD  pantoprazole  (PROTONIX ) 40 MG tablet Take 40 mg by mouth 2 (two) times daily. 12/26/23  Yes [provider]  sertraline  (ZOLOFT ) 100 MG tablet Take 200 mg by mouth daily.   Yes [provider]  vitamin B-12 (CYANOCOBALAMIN ) 500 MCG tablet Take 500-1,000 mcg by mouth daily.   Yes [provider]  allopurinol (ZYLOPRIM) 100 MG tablet Take 200 mg by mouth daily. Patient not taking: Reported on 10/11/2023 07/06/23    [provider]  diltiazem  (CARDIZEM  CD) 180 MG 24 hr capsule Take 1 capsule (180 mg total) by mouth daily. Patient not taking: Reported on 01/12/2024 10/28/23   Samtani, Jai-Gurmukh, MD  DULoxetine  (CYMBALTA ) 20 MG capsule Take 2 capsules (40 mg total) by mouth daily. Patient not taking: Reported on 01/12/2024 10/28/23   Samtani, Jai-Gurmukh, MD  melatonin 3 MG TABS tablet Take 3 mg by mouth at bedtime. Patient not taking: Reported on 04/04/2024    [provider]  trazodone  (DESYREL ) 300 MG tablet Take 300 mg by mouth at bedtime. Patient not taking: Reported on 01/12/2024    [provider]  Vitamin D, Ergocalciferol, (DRISDOL) 1.25 MG (50000 UNIT) CAPS capsule Take 50,000 Units by mouth every 7 (seven) days. Patient not taking: Reported on 08/08/2023 07/06/23   [provider]    Scheduled Meds:  acetaminophen   1,000 mg Oral Q8H   ARIPiprazole   10 mg Oral Daily   cyanocobalamin   500-1,000 mcg Oral Daily   diclofenac  Sodium  2 g Topical QID   enoxaparin  (LOVENOX ) injection  40 mg Subcutaneous Q24H   folic acid   1 mg Oral Daily   gabapentin   300 mg Oral TID   LORazepam   0-4 mg Intravenous Q12H   Or   LORazepam   0-4 mg Oral Q12H   melatonin  3 mg Oral QHS   metFORMIN   500 mg Oral BID WC   nicotine   14 mg Transdermal Daily   pantoprazole   40 mg Oral BID   polyethylene glycol  17 g Oral BID   sertraline   200 mg Oral Daily   thiamine   100 mg Oral Daily   Or   thiamine   100 mg Intravenous Daily   Continuous Infusions: PRN Meds:.albuterol , hydrOXYzine , metoprolol  tartrate, ondansetron  **OR** ondansetron  (ZOFRAN ) IV, oxyCODONE  **OR** oxyCODONE , polyethylene glycol  Allergies as of 04/04/2024   (No Known Allergies)    Family History  Problem Relation Age of Onset   Cancer Father        PROSTATE    Social History   Socioeconomic History   Marital status: Single    Spouse name: Not on file   Number of children: 2   Years of education: 12th    Highest education level: Not on file  Occupational History   Occupation: post office  Tobacco Use   Smoking status: Every Day    Current packs/day: 0.12    Average packs/day: 0.1 packs/day for 45.0 years (5.4 ttl pk-yrs)    Types: Cigarettes   Smokeless tobacco: Never  Vaping Use   Vaping status: Never Used  Substance and Sexual Activity   Alcohol use: Yes    Comment: Daily, reports drinking 2 forty ounce malt liquors for last 3 months staigh; reports problem with ETOH since 20's   Drug use: Not Currently    Types: Cocaine, Heroin, Oxycodone , Morphine , Fentanyl    Sexual activity: Not Currently  Other Topics Concern   Not on file  Social History Narrative   ** Merged History Encounter **       Patient lives at home alone.SABRASABRADrinks coffee daily    Social Drivers of Corporate investment banker  Strain: Low Risk  (10/27/2022)   Received from [facility]   Overall Financial Resource Strain (CARDIA)    Difficulty of Paying Living Expenses: Not very hard  Food Insecurity: Food Insecurity Present (04/07/2024)   Hunger Vital Sign    Worried About Running Out of Food in the Last Year: Sometimes true    Ran Out of Food in the Last Year: Sometimes true  Transportation Needs: Unmet Transportation Needs (04/07/2024)   PRAPARE - Administrator, Civil Service (Medical): Yes    Lack of Transportation (Non-Medical): Yes  Physical Activity: Inactive (10/27/2022)   Received from [facility]   Exercise Vital Sign    On average, how many days per week do you engage in moderate to strenuous exercise (like a brisk walk)?: 0 days    On average, how many minutes do you engage in exercise at this level?: 0 min  Stress: Stress Concern Present (10/27/2022)   Received from [facility] - Occupational Stress Questionnaire    Feeling of Stress : To some extent  Social Connections: Socially Isolated (04/07/2024)   Social Connection and  Isolation Panel    Frequency of Communication with Friends and Family: Once a week    Frequency of Social Gatherings with Friends and Family: Once a week    Attends Religious Services: Never    Database administrator or Organizations: No    Attends Banker Meetings: Never    Marital Status: Separated  Intimate Partner Violence: Not At Risk (04/07/2024)   Humiliation, Afraid, Rape, and Kick questionnaire    Fear of Current or Ex-Partner: No    Emotionally Abused: No    Physically Abused: No    Sexually Abused: No    Review of Systems: All negative except as stated above in HPI.  Physical Exam: Vital signs: Vitals:   04/07/24 0427 04/07/24 0837  BP: 129/84 (!) 136/90  Pulse: 68 68  Resp: 14 16  Temp: 98.7 F (37.1 C) 98.5 F (36.9 C)  SpO2: 95% 96%   Last BM Date : 04/05/24 General:   Alert,  Well-developed, well-nourished, pleasant and cooperative in NAD Lungs: No visible respiratory distress Heart:  Regular rate and rhythm; no murmurs, clicks, rubs,  or gallops. Abdomen: Soft, nontender, nondistended, bowel sound present, no peritoneal signs Mood and affect normal Alert and oriented x 3 Rectal:  Deferred  GI:  Lab Results: Recent Labs    04/05/24 0455 04/06/24 0413 04/07/24 0851  WBC 8.6 7.1 5.1  HGB 13.6 13.1 13.5  HCT 39.5 38.9* 40.0  PLT 287 261 256   BMET Recent Labs    04/05/24 1656 04/06/24 0413 04/07/24 0851  NA 127* 130* 130*  K 4.9 4.9 4.2  CL 88* 99 98  CO2 25 26 25   GLUCOSE 104* 85 98  BUN 14 16 15   CREATININE 1.41* 1.23 1.26*  CALCIUM  9.5 9.8 9.3   LFT Recent Labs    04/07/24 0851  PROT 7.0  ALBUMIN 2.9*  AST 15  ALT 10  ALKPHOS 38  BILITOT 0.6   PT/INR No results for input(s): LABPROT, INR in the last 72 hours.   Studies/Results: DG ESOPHAGUS W SINGLE CM (SOL OR THIN BA) Result Date: 04/06/2024 CLINICAL DATA:  67 year old male with globus sensation for diagnostic esophagram. EXAM: ESOPHAGUS/BARIUM  SWALLOW/TABLET STUDY TECHNIQUE: Single contrast examination was performed using thin liquid barium. This exam was performed by Abigail C. Augusta,  PA-C, and was supervised and interpreted by Dr. Toribio Faes. FLUOROSCOPY: Radiation Exposure Index (as provided by the fluoroscopic device): 71.5 mGy Kerma COMPARISON:  None Available. FINDINGS: Swallowing: Appears normal. No vestibular penetration or aspiration seen. Pharynx: Unremarkable. Esophagus: Mild tapered stricture in distal third of the esophagus just above gastroesophageal junction. Esophageal motility: Tertiary contractions visualized in distal third of the esophagus. Hiatal Hernia: Mild hiatal hernia visualized just prior to gastroesophageal junction. Gastroesophageal reflux: None visualized. Ingested 13mm barium tablet: Became stuck in the distal third of the esophagus, subsequent sips of water and thin barium were given without passage of tablet. Patient was informed tablet will dissolve. Other: Exam limited due to patient's inability to stand. IMPRESSION: Mild tapered stricture in distal third of the esophagus, tertiary contractions visualized in distal third of the esophagus, mild hiatal hernia visualized just prior to gastroesophageal junction. Performed By Lavanda Jurist, PA-C Electronically Signed   By: JONETTA Faes M.D.   On: 04/06/2024 14:26   DG Knee 1-2 Views Right Result Date: 04/06/2024 CLINICAL DATA:  Bilateral knee pain. EXAM: RIGHT KNEE - 1-2 VIEW COMPARISON:  12/22/2023 FINDINGS: Again seen patellar Baja. The joint spaces are normal. No fracture. Probable ossified intra-articular bodies in the suprapatellar space, chronic. No significant joint effusion. No erosive or bony destructive change. Chondrocalcinosis. Mild tricompartmental peripheral spurring. IMPRESSION: 1. Mild tricompartmental osteoarthritis with chondrocalcinosis. 2. Probable ossified intra-articular bodies in the suprapatellar space, chronic. 3. Patella baja. Electronically  Signed   By: Andrea Gasman M.D.   On: 04/06/2024 14:02   DG Knee 1-2 Views Left Result Date: 04/06/2024 CLINICAL DATA:  Bilateral knee pain. EXAM: LEFT KNEE - 1-2 VIEW COMPARISON:  01/11/2024 FINDINGS: Lateral tibiofemoral joint space narrowing. Mild to moderate tricompartmental peripheral spurring. Fragmented quadriceps enthesophyte versus ossified intra-articular body. Faint chondrocalcinosis. No fracture, erosion, or focal bone abnormality. Diminished joint effusion. Unremarkable soft tissues. IMPRESSION: 1. Mild to moderate tricompartmental osteoarthritis, most prominent in the lateral tibiofemoral compartment. 2. Fragmented quadriceps enthesophyte versus ossified intra-articular body. Electronically Signed   By: Andrea Gasman M.D.   On: 04/06/2024 14:01   MR LUMBAR SPINE WO CONTRAST Result Date: 04/05/2024 CLINICAL DATA:  Lumbar radiculopathy, symptoms persist with > 6 wks treatment Patient reports chronic pain medications with left testicular pain for approximately 2 months. EXAM: MRI LUMBAR SPINE WITHOUT CONTRAST TECHNIQUE: Multiplanar, multisequence MR imaging of the lumbar spine was performed. No intravenous contrast was administered. COMPARISON:  Abdominopelvic CT 04/04/2024. Chest CT 09/08/2018. Lumbar spine radiographs 12/03/2009. FINDINGS: Segmentation: Transitional lumbosacral anatomy. As correlated with prior CTs, there are 12 rib-bearing thoracic type vertebral bodies, 4 lumbar type vertebral bodies and a largely sacralized transitional L5 segment. Alignment:  Physiologic. Vertebrae: No worrisome osseous lesion, acute fracture or pars defect. Prominent hemangioma within the L3 vertebral body. Partially ankylosed paraspinal osteophytes in the lower thoracic spine. Mild sacroiliac degenerative changes bilaterally. Conus medullaris: Extends to the T12-L1 level. The conus and cauda equina appear normal. Paraspinal and other soft tissues: No significant paraspinal findings. Simple appearing  bilateral renal cysts for which no specific follow-up imaging is recommended. Stable 1.9 cm right adrenal nodule reported as a probable adenoma on prior CT, stable from 2020 chest CT. Disc levels: Sagittal images demonstrate paraspinal osteophytes in the lower thoracic spine but no evidence of spinal stenosis or significant foraminal narrowing. L1-2: Normal interspace. L2-3: Mild disc desiccation and bulging with preserved disc height. Mild facet hypertrophy. No spinal stenosis or foraminal narrowing. L3-4: Mild disc desiccation and bulging with preserved disc  height. Mild to moderate facet and ligamentous hypertrophy. No spinal stenosis or foraminal narrowing. L4-5: Preserved disc height with mild disc bulging and moderate to severe asymmetric right facet hypertrophy. No spinal stenosis or foraminal narrowing. L5-S1: As numbered, this is a vestigial disc space without acquired abnormality. IMPRESSION: 1. Transitional lumbosacral anatomy. There are 4 lumbar type vertebral bodies and a largely sacralized transitional L5 segment. 2. No acute findings or explanation for the patient's symptoms. 3. Mild disc bulging as described. No spinal stenosis or nerve root encroachment. Asymmetric right-sided facet hypertrophy at L5-S1. 4. Stable right adrenal nodule, reported as a probable adenoma on prior CT. Electronically Signed   By: Elsie Perone M.D.   On: 04/05/2024 14:52    Impression/Plan: -Dysphagia with abnormal esophagogram concerning for lower esophageal stricture. - Oropharyngeal dysphagia with constant coughing and choking sensation - History of DVT and PE.  Currently not on anticoagulation. - History of A-fib.  Recommendations ------------------------- - Patient is currently Not on anticoagulation. - Tentative plan for EGD with possible dilation tomorrow.  Keep n.p.o. past midnight. - He will benefit from speech therapy evaluation.  Risks (bleeding, infection, bowel perforation that could require  surgery, sedation-related changes in cardiopulmonary systems), benefits (identification and possible treatment of source of symptoms, exclusion of certain causes of symptoms), and alternatives (watchful waiting, radiographic imaging studies, empiric medical treatment)  were explained to patient/family in detail and patient wishes to proceed.    LOS: 3 days   Layla Lah  MD, FACP 04/07/2024, 12:27 PM  Contact #  [phone]

## 2024-04-08 ENCOUNTER — Inpatient Hospital Stay (HOSPITAL_COMMUNITY): Admitting: Anesthesiology

## 2024-04-08 ENCOUNTER — Inpatient Hospital Stay (HOSPITAL_COMMUNITY)

## 2024-04-08 ENCOUNTER — Encounter (HOSPITAL_COMMUNITY)
Admission: EM | Disposition: A | Discharge status: Skilled Nursing Facility | Payer: Self-pay | Source: Home / Self Care | Attending: Family Medicine

## 2024-04-08 ENCOUNTER — Encounter (HOSPITAL_COMMUNITY): Payer: Self-pay | Admitting: Family Medicine

## 2024-04-08 DIAGNOSIS — K2970 Gastritis, unspecified, without bleeding: Secondary | ICD-10-CM

## 2024-04-08 DIAGNOSIS — F17210 Nicotine dependence, cigarettes, uncomplicated: Secondary | ICD-10-CM

## 2024-04-08 DIAGNOSIS — I5032 Chronic diastolic (congestive) heart failure: Secondary | ICD-10-CM | POA: Diagnosis not present

## 2024-04-08 DIAGNOSIS — I110 Hypertensive heart disease with heart failure: Secondary | ICD-10-CM | POA: Diagnosis not present

## 2024-04-08 DIAGNOSIS — E871 Hypo-osmolality and hyponatremia: Secondary | ICD-10-CM | POA: Diagnosis not present

## 2024-04-08 DIAGNOSIS — F314 Bipolar disorder, current episode depressed, severe, without psychotic features: Secondary | ICD-10-CM | POA: Diagnosis not present

## 2024-04-08 HISTORY — PX: ESOPHAGOGASTRODUODENOSCOPY: SHX5428

## 2024-04-08 LAB — BASIC METABOLIC PANEL WITH GFR
Anion gap: 11 (ref 5–15)
BUN: 16 mg/dL (ref 8–23)
CO2: 25 mmol/L (ref 22–32)
Calcium: 10 mg/dL (ref 8.9–10.3)
Chloride: 96 mmol/L — ABNORMAL LOW (ref 98–111)
Creatinine, Ser: 1.16 mg/dL (ref 0.61–1.24)
GFR, Estimated: >60 mL/min (ref >60)
Glucose, Bld: 113 mg/dL — ABNORMAL HIGH (ref 70–99)
Potassium: 4.6 mmol/L (ref 3.5–5.1)
Sodium: 132 mmol/L — ABNORMAL LOW (ref 135–145)

## 2024-04-08 LAB — PHOSPHORUS: Phosphorus: 3.9 mg/dL (ref 2.5–4.6)

## 2024-04-08 LAB — MAGNESIUM: Magnesium: 1.9 mg/dL (ref 1.7–2.4)

## 2024-04-08 LAB — CBC
HCT: 43.6 % (ref 39.0–52.0)
Hemoglobin: 14.2 g/dL (ref 13.0–17.0)
MCH: 31.4 pg (ref 26.0–34.0)
MCHC: 32.6 g/dL (ref 30.0–36.0)
MCV: 96.5 fL (ref 80.0–100.0)
Platelets: 267 K/uL (ref 150–400)
RBC: 4.52 MIL/uL (ref 4.22–5.81)
RDW: 13.1 % (ref 11.5–15.5)
WBC: 7 K/uL (ref 4.0–10.5)
nRBC: 0 % (ref 0.0–0.2)

## 2024-04-08 LAB — GLUCOSE, CAPILLARY: Glucose-Capillary: 111 mg/dL — ABNORMAL HIGH (ref 70–99)

## 2024-04-08 LAB — LIPASE, BLOOD: Lipase: 33 U/L (ref 11–51)

## 2024-04-08 SURGERY — EGD (ESOPHAGOGASTRODUODENOSCOPY)
Anesthesia: Monitor Anesthesia Care

## 2024-04-08 MED ORDER — MORPHINE SULFATE (PF) 2 MG/ML IV SOLN
2.0000 mg | INTRAVENOUS | Status: AC | PRN
  Administered 2024-04-08 – 2024-04-09 (×2): 2 mg via INTRAVENOUS
  Filled 2024-04-08 (×2): qty 1

## 2024-04-08 MED ORDER — SODIUM CHLORIDE 0.9 % IV SOLN: INTRAVENOUS | Status: DC

## 2024-04-08 MED ORDER — PROPOFOL 500 MG/50ML IV EMUL
INTRAVENOUS | Status: DC | PRN
  Administered 2024-04-08: 125 ug/kg/min via INTRAVENOUS

## 2024-04-08 MED ORDER — PANTOPRAZOLE SODIUM 40 MG IV SOLR
40.0000 mg | Freq: Two times a day (BID) | INTRAVENOUS | Status: DC
  Administered 2024-04-08 – 2024-04-13 (×11): 40 mg via INTRAVENOUS
  Filled 2024-04-08 (×11): qty 10

## 2024-04-08 MED ORDER — GLYCOPYRROLATE PF 0.2 MG/ML IJ SOSY
PREFILLED_SYRINGE | INTRAMUSCULAR | Status: DC | PRN
Start: 2024-04-08 — End: 2024-04-08
  Administered 2024-04-08: .1 mg via INTRAVENOUS

## 2024-04-08 MED ORDER — SUCRALFATE 1 G PO TABS
1.0000 g | ORAL_TABLET | Freq: Two times a day (BID) | ORAL | Status: DC
  Administered 2024-04-08 – 2024-04-13 (×11): 1 g via ORAL
  Filled 2024-04-08 (×11): qty 1

## 2024-04-08 MED ORDER — PROPOFOL 10 MG/ML IV BOLUS
INTRAVENOUS | Status: DC | PRN
  Administered 2024-04-08: 50 mg via INTRAVENOUS

## 2024-04-08 MED ORDER — DICYCLOMINE HCL 10 MG PO CAPS
10.0000 mg | ORAL_CAPSULE | Freq: Three times a day (TID) | ORAL | Status: DC
  Administered 2024-04-08 – 2024-04-13 (×18): 10 mg via ORAL
  Filled 2024-04-08 (×23): qty 1

## 2024-04-08 MED ORDER — IOHEXOL 350 MG/ML SOLN
75.0000 mL | Freq: Once | INTRAVENOUS | Status: AC | PRN
  Administered 2024-04-08: 75 mL via INTRAVENOUS

## 2024-04-08 MED ORDER — LIDOCAINE 2% (20 MG/ML) 5 ML SYRINGE
INTRAMUSCULAR | Status: DC | PRN
  Administered 2024-04-08: 80 mg via INTRAVENOUS

## 2024-04-08 MED ORDER — ONDANSETRON HCL 4 MG/2ML IJ SOLN
INTRAMUSCULAR | Status: DC | PRN
  Administered 2024-04-08: 4 mg via INTRAVENOUS

## 2024-04-08 NOTE — Transfer of Care (Signed)
 Immediate Anesthesia Transfer of Care Note  Patient: Todd [name].  Procedure(s) Performed: EGD (ESOPHAGOGASTRODUODENOSCOPY)  Patient Location: PACU  Anesthesia Type:[name]  Level of Consciousness: awake and drowsy  Airway & Oxygen Therapy: Patient Spontanous Breathing and Patient connected to face mask oxygen  Post-op Assessment: Report given to RN, Post -op Vital signs reviewed and stable, and Patient moving all extremities X 4  Post vital signs: Reviewed and stable  Last Vitals:  Vitals Value Taken Time  BP 88/66 04/08/24 07:58  Temp    Pulse 68 04/08/24 07:59  Resp 18 04/08/24 07:59  SpO2 95 % 04/08/24 07:59  Vitals shown include unfiled device data.  Last Pain:  Vitals:   04/08/24 0706  TempSrc: Temporal  PainSc: 0-No pain         Complications: No notable events documented.

## 2024-04-08 NOTE — Plan of Care (Signed)
  Problem: Education: Goal: Knowledge of General Education information will improve Description: Including pain rating scale, medication(s)/side effects and non-pharmacologic comfort measures Outcome: Progressing   Problem: Activity: Goal: Risk for activity intolerance will decrease Outcome: Progressing   Problem: Elimination: Goal: Will not experience complications related to bowel motility Outcome: Progressing   Problem: Pain Managment: Goal: General experience of comfort will improve and/or be controlled Outcome: Progressing

## 2024-04-08 NOTE — Op Note (Signed)
 [facility] Patient Name: Todd [name] Procedure Date : 04/08/2024 MRN: [id number] Attending MD: Layla Lah , MD, [id number] Date of Birth: 07/23/1956 CSN: [id number] Age: 68 Admit Type: Inpatient Procedure:                Upper GI endoscopy Indications:              Dysphagia Providers:                Layla Lah, MD, Almarie Masters, RN, Curtistine Bishop, Technician Referring MD:              Medicines:                Sedation Administered by an Anesthesia Professional Complications:            No immediate complications. Estimated Blood Loss:     Estimated blood loss was minimal. Procedure:                Pre-Anesthesia Assessment:                           - Prior to the procedure, a History and Physical                            was performed, and patient medications and                            allergies were reviewed. The patient's tolerance of                            previous anesthesia was also reviewed. The risks                            and benefits of the procedure and the sedation                            options and risks were discussed with the patient.                            All questions were answered, and informed consent                            was obtained. Prior Anticoagulants: The patient has                            taken no anticoagulant or antiplatelet agents. ASA                            Grade Assessment: III - A patient with severe                            systemic disease. After reviewing the risks and  benefits, the patient was deemed in satisfactory                            condition to undergo the procedure.                           After obtaining informed consent, the endoscope was                            passed under direct vision. Throughout the                            procedure, the patient's blood pressure, pulse, and                             oxygen saturations were monitored continuously. The                            [id number]-[id number] ([id number]) Olympus endoscope was introduced                            through the mouth, and advanced to the second part                            of duodenum. The upper GI endoscopy was                            accomplished without difficulty. The patient                            tolerated the procedure well. Scope In: Scope Out: Findings:      LA Grade D (one or more mucosal breaks involving at least 75% of       esophageal circumference) esophagitis with no bleeding was found in the       mid and distal esophagus. Biopsies were taken with a cold forceps for       histology.      A non-obstructing Schatzki ring was found at the gastroesophageal       junction.      A small hiatal hernia was present.      Scattered mild inflammation characterized by congestion (edema) and       erythema was found in the entire examined stomach. Biopsies were taken       with a cold forceps for histology.      The cardia and gastric fundus were normal on retroflexion.      Scattered moderate inflammation characterized by congestion (edema) and       erythema was found in the duodenal bulb. Impression:               - LA Grade D erosive esophagitis with no bleeding.                            Biopsied.                           - Non-obstructing Schatzki ring.                           -  Small hiatal hernia.                           - Chronic gastritis. Biopsied.                           - Duodenitis. Recommendation:           - Return patient to hospital ward for ongoing care.                           - Soft diet.                           - Continue present medications.                           - Await pathology results. Procedure Code(s):        --- Professional ---                           [phone], Esophagogastroduodenoscopy, flexible,                            transoral; with biopsy, single or  multiple Diagnosis Code(s):        --- Professional ---                           K20.80, Other esophagitis without bleeding                           K22.2, Esophageal obstruction                           K44.9, Diaphragmatic hernia without obstruction or                            gangrene                           K29.50, Unspecified chronic gastritis without                            bleeding                           K29.80, Duodenitis without bleeding                           R13.10, Dysphagia, unspecified CPT copyright 2022 American Medical Association. All rights reserved. The codes documented in this report are preliminary and upon coder review may  be revised to meet current compliance requirements. Layla Lah, MD Layla Lah, MD 04/08/2024 9:09:51 AM Number of Addenda: 0

## 2024-04-08 NOTE — Plan of Care (Signed)

## 2024-04-08 NOTE — Brief Op Note (Signed)
 04/08/2024  7:50 AM  PATIENT:  [name].  68 y.o. male  PRE-OPERATIVE DIAGNOSIS:  Dysphagia, abnormal barium swallow  POST-OPERATIVE DIAGNOSIS:  gastric bx r/o hp  esophageal bx r/o candida  PROCEDURE:  Procedure(s): EGD (ESOPHAGOGASTRODUODENOSCOPY) (N/A)  SURGEON:  Surgeons and Role:    * Elicia Claw, MD - Primary  Findings ------------- - EGD showed severe LA grade D esophagitis.  Lower esophageal stricture, nonobstructive.  Gastritis.  Biopsies taken.  Recommendations -------------------------- - Recommend Protonix  40 mg IV twice daily while in the hospital and Protonix  40 mg twice a day on discharge for 4 weeks followed by Protonix  40 mg once a day - Sucralfate  1 g twice daily for 2 weeks - Avoid NSAIDs - Recommend speech therapy evaluation for possible oropharyngeal dysphagia - Dysphagia 3 diet and advance as tolerated - GI will sign off.  Follow-up in GI clinic in 6 to 8 weeks.   Claw Elicia MD, FACP 04/08/2024, 7:51 AM  Contact #  [phone]

## 2024-04-08 NOTE — Consult Note (Signed)
 [facility]  Patient Name: .Todd [name].  MRN: [id number]  DOB: 29-Apr-1956  Consult Order details:  Orders (From admission, onward)     Start     Ordered   04/05/24 1052  IP CONSULT TO PSYCHIATRY       Comments: When asked if he was trying to hurt himself with trazodone , he said more or less, I didn't want to face the day  Ordering Provider: Perri DELENA Meliton [name]., MD  Provider:  (Not yet assigned)  Question Answer Comment  Location [facility]   Reason for Consult? suicidal ideation, taking 7-8 trazodone  a day      04/05/24 1052          Mode of Visit: In person   Psychiatry Consult Evaluation  Service Date: April 08, 2024 LOS:  LOS: 4 days  Chief Complaint:   Primary Psychiatric Diagnoses  Bipolar disorder, current episode mixed 2.  Ethanol use disorder 3.  Polysubstance use disorder (cocaine - in remission),   Assessment  Todd [name]. is a 68 y.o. male admitted: Presented to the ED for 04/04/2024  1:30 PM for testicular pain. He carries the psychiatric diagnoses of bipolar disorder, polysubstance use disorder (alcohol,  and has a past medical history of HTN, ulcerative esophagitis,  prostate cancer, BPPV, pulmonary embolus, heroin overdose, other opioid overdose, varicose veins, pulmonary nodule, SVT, RBBB, paroxysmal atrial flutter, congenital heart abnormality (unspecified),  His current presentation of persistently low mood, anergy, isolation, irritability, insomnia, increased substance use, with suicidal ideations and increasingly risky-medication use (trazodone  7+ pills nightly) is most consistent with bipolar disorder, current episode mixed. He meets criteria for bipolar disorder based on patient history, chart review.  Current outpatient psychotropic medications include sertraline  and trazodone  and historically he has had a mixed response to these medications. He was partially compliant with medications prior  to admission as evidenced by patient report. On initial examination, patient shared that he is not currently suicidal, but has had many of those thoughts over the last few weeks in the period of his worsening insomnia and depressive symptoms, leading him to take more and more alcohol and trazodone . Please see plan below for detailed recommendations.   04/07/2024: Patient seen face to face in his hospital room. He is awake, alert and oriented x 3. Patient reports depressed mood, lack of motivation, low energy level, racing thoughts, mood swings and sleep problem with frequent awakenings, but denies suicidal or homicidal ideation, intent or plan. Patient also denies delusions, hallucinations, thought insertion or thought broadcasting. However, patient reports ongoing abdominal pain for which he currently takes pain medication but states it seems not to be enough. Patient denies suicidal or homicidal ideation, intent or plan and denies any recent overdose saying I took multiple pills of Trazodone  because I could not sleep.   On further questioning about sleep, patient states he had sleep study done about 10 years ago which was negative for sleep apnea. However, patient reports he has gained significant amount of weight in the last few years and snores at night. As a result, this Clinical research associate encouraged the patient to repeat sleep study in the outpatient to rule out sleep apnea. Patient verbalized understanding.   04/08/2024: Patient seen face to face in his hospital room. He is awake, alert and oriented x 4. Patient reports severe vague upper abdominal pain rated as 12/10 in severity which he states has not been improving with current pain medications.However, patient is unable to further  characterize the pain and only mild tenderness was elicited on examination. He was informed that his pain doctor would be informed about his complaints. Overall, patient reports some improvement in mood but feels anxious and worried  about his ongoing pain and abdominal discomfort. Essentially, patient denies suicidal or homicidal ideation, intent or plan. He denies hallucinations, delusions, idea of reference or thought broadcasting. Patient also denies any side effects from current psychotropic medications.    Diagnoses:  Active Hospital problems: Principal Problem:   Hyponatremia Active Problems:   Non-insulin  dependent type 2 diabetes mellitus (HCC)   Affective bipolar disorder (HCC)   Primary hypertension   Alcohol abuse   Ulcerative esophagitis   Atrial flutter (HCC)   Acute bilateral knee pain   History of prostate cancer   Testicular pain, left    Plan   ## Psychiatric Medication Recommendations:  -- Continue sertraline  200 mg daily for depression  -- Continue abilify  10 mg daily for mood stabilization -- Continue metformin  500mg  BID for antipsychotic-associated weight gain -- Continue folic acid  1 mg daily due to previous deficiency, poor po intake --Continue other treatment as the primary team including pain. --Psychiatric consult service will sign off for now. Please re-consult as needed --Thank you for this consult.    ## Medical Decision Making Capacity: Not specifically addressed in this encounter  ## Further Work-up:  -- EKG TSH, B12, folate, EKG, or While pt on Qtc prolonging medications, please monitor & replete K+ to 4 and Mg2+ to 2 -- most recent EKG on 8/21 had QtC of 493 (with bifascicular block) -- Pertinent labwork reviewed earlier this admission includes: cbc, bmp, A1c   ## Disposition:-- Plan Post Discharge/Psychiatric Care Follow-up resources will request psychiatric follow-up care with [facility] or his social worker  ## Behavioral / Environmental: - No specific recommendations at this time.     ## Safety and Observation Level:  - Based on my clinical evaluation, I estimate the patient to be at low risk of self harm in the current setting. - At this time, we recommend   routine. This decision is based on my review of the chart including patient's history and current presentation, interview of the patient, mental status examination, and consideration of suicide risk including evaluating suicidal ideation, plan, intent, suicidal or self-harm behaviors, risk factors, and protective factors. This judgment is based on our ability to directly address suicide risk, implement suicide prevention strategies, and develop a safety plan while the patient is in the clinical setting. Please contact our team if there is a concern that risk level has changed.  CSSR Risk Category:C-SSRS RISK CATEGORY: High Risk  Suicide Risk Assessment: Patient has following modifiable risk factors for suicide: under treated depression , social isolation, recklessness, medication noncompliance, active mental illness (to encompass adhd, tbi, mania, psychosis, trauma reaction), current symptoms: anxiety/panic, insomnia, impulsivity, anhedonia, hopelessness, and pain, medical illness (ie new dx of cancer), which we are addressing by starting a mood stabilizer, connecting him with outpatient psychiatric resources..  Patient has following non-modifiable or demographic risk factors for suicide: male gender, separation or divorce, history of suicide attempt, history of self harm behavior, and psychiatric hospitalization Patient has the following protective factors against suicide: Cultural, spiritual, or religious beliefs that discourage suicide  Thank you for this consult request. Recommendations have been communicated to the primary team.  We will sign off at this time.   Jan DELENA Donath, MD       History of Present Illness  Relevant Aspects  of Hospital Course:  Admitted on 04/04/2024 for knee pain, testicular pain, sore throat. They were found to have significant hyponatremia.   Patient Report:  04/06/2024 Patient continues to endorse poor mood unchanged from yesterday, however continues to endorse  strong will and desire to improve.  We reviewed risks and benefits of Abilify  as well as side effects, patient denies having seen any side effects but also denies seeing any benefit from the Abilify  so far.  Patient agrees to increase in medication.  Aims 0.  Negative for cogwheeling or tremors.  Continues with passive SI, denies active SI, denies HI, auditory or visual hallucinations.   04/05/2024 Met patient early afternoon bedside, he was pleasant and agreeable to interview. He reports 6 weeks of worsening depressive mood symptoms including feelings of guilt, worthlessness, passive suicidal ideation, rumination on past mistakes, with low energy, anhedonia, and increased use of substances (alcohol). He also reports a concerning pattern of severe insomnia with approximately one month of reduced sleep (2-3 hours per night) despite larger and larger doses of trazodone . He reports passive suicidal ideation over the last month due to his worsening overall health, his social situation (isolated from friends, family, community of faith), and his living situation.  He reports that he was taking 7-8 pills of trazodone  300 mg in order to get some rest, but he was aware that dosage like this might be dangerous. He denies current SI, HI, AVH.  Psych ROS:  Depression: guilt, worthlessness, passive suicidal ideation, rumination on past mistakes, with low energy, anhedonia, and increased use of substances (alcohol) Anxiety:  denies Mania (lifetime and current): denies grandiosity, pressured speech, endorses irritability, poor concentration, severe reductions in sleep. Psychosis: (lifetime and current): denies current, not seen responding to internal stimuli  Collateral information:  Contacted Nat Chang (case worker) at [phone] on 04/05/2024, 1300, left VM  Collateral information:  Contacted Dona Klemann (spouse, separated, [phone]) on 04/05/2024, 1310 -1325. She has had a stroke and has some  difficulty making herself understood on the phone.  Married for 10 years separated for 5 years. She lives in [facility], talks with patient 2-3 times per week. They have remained very close. She is concerned about the patient. Last saw him in person around Father's day when she stayed for several weeks.  Patient has been grieving due to the death of his father on 03/10/24, sister, and a friend. Medical situation - she went with him to several appointments, she saw pt with his PCP and orthopedist.  No history of AVH. Strong history of insomnia. History of chronic pain. 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 History of involvement in AA, but none currently.  Patient was sober, but fell off the wagon and has had drinking problems and substance use problems for many years. Denies statements of self-harm, denies firearms, does endorse many pills at home.    Review of Systems  Gastrointestinal:  Positive for abdominal pain.  Musculoskeletal:  Positive for joint pain.  Psychiatric/Behavioral:  Positive for depression and substance abuse. Negative for suicidal ideas. The patient has insomnia.      Psychiatric and Social History  Psychiatric History:  Information collected from chart review, patient  Prev Dx/Sx: Bipolar disorder, alcohol use disorder, opioid use disorder, stimulant use disorder (cocaine) Current Psych Provider: None Home Meds (current): sertraline , does not remember other medications Previous Med Trials: depakote , sertraline150 mg, mirtazapine, olanzapine, sertraline , trazodone ,  Therapy: none current  Prior Psych Hospitalization: Last hospitalization 04/21/2023  Prior Self Harm: yes. Frequent history of suicidal ideation with some plans. Prior Violence: denies, spouse denies  Family Psych History:  Family Hx suicide: denies  Social History:  Developmental Hx: Grew up in [place] , has been in [place] for 30  years Educational Hx: finished high school Occupational Hx: former Energy manager, former Academic librarian. Legal Hx: denies Living Situation: lives in subsidized housing Spiritual Hx: christian, baptist. Access to weapons/lethal means: denies firearms access. Has access to drugs of abuse    Substance use history: Alcohol: daily Type of alcohol: beer Last Drink: days before admission Number of drinks per day: not sure, more than a few History of alcohol withdrawal seizures: denies History of DT's: denies Tobacco: 5.4 pack-year smoking history.  Illicit drugs:  Marijuana/THC: past Cocaine: yes, past Methamphetamines: no Benzodiazepines: no Opioids: yes, prescribed oxycodone  for pay Hallucinogens: no Bath salts: no Prescription Drug abuse problems:  Exam Findings  Physical Exam:  Vital Signs:  Temp:  [96.7 F (35.9 C)-98.9 F (37.2 C)] 96.7 F (35.9 C) (08/24 0835) Pulse Rate:  [57-78] 66 (08/24 0835) Resp:  [14-21] 14 (08/24 0835) BP: (86-136)/(66-98) 126/82 (08/24 0835) SpO2:  [93 %-97 %] 97 % (08/24 0835) Blood pressure 126/82, pulse 66, temperature (!) 96.7 F (35.9 C), temperature source Rectal, resp. rate 14, height 5' 8 (1.727 m), weight 75.8 kg, SpO2 97%. Body mass index is 25.41 kg/m.  Physical Exam Vitals reviewed.  Constitutional:      Appearance: He is obese. He is ill-appearing.  HENT:     Head: Normocephalic and atraumatic.  Pulmonary:     Breath sounds: Rhonchi present.  Skin:    General: Skin is warm.  Neurological:     Mental Status: He is oriented to person, place, and time.     Mental Status Exam  Apperance: Appropriate for environment and Laying in bed Behavior: Calm Speech: Normal Rate, Articulate, Normal Volume, and Responsive Attitude: Cooperative and Friendly Mood: not well Affect: DEPRESSED, Normal Range, and Mood Congruent Perception: Not responding to internal stimuli Thought Content: within normal  limits Thought Form: Goal Directed, Organized, Linear, and Logical Cognition: Alert & Oriented to person, place, and time, Recent and Remote memory grossly intact by recounting personal history, and Immediate memory grossly intact by interview Judgment: Fair Insight: Fair      Other History   These have been pulled in through the EMR, reviewed, and updated if appropriate.  Family History:  The patient's family history includes Cancer in his father.  Medical History: Past Medical History:  Diagnosis Date   Arthritis    fingers (07/14/2018)   Depression    DVT (deep venous thrombosis) (HCC) LLE   Hepatitis C     finished harvoni tx ~ 2017   Hypercholesterolemia    Hypertension    Prostate cancer (HCC) 40yrs ago   Pulmonary embolism and infarction (HCC) 07/13/2018   Sleep apnea    not currently using cpap, mask causing vertigo   Type II diabetes mellitus [facility])     Surgical History: Past Surgical History:  Procedure Laterality Date   BIOPSY  01/12/2019   Procedure: BIOPSY;  Surgeon: Donnald Charleston, MD;  Location[facility] ENDOSCOPY;  Service: Endoscopy;;   BIOPSY  06/23/2019   Procedure: BIOPSY;  Surgeon: Burnette Fallow, MD;  Location: [facility] ENDOSCOPY;  Service: Endoscopy;;   COLONOSCOPY WITH PROPOFOL  N/A 02/19/2014   Procedure: COLONOSCOPY WITH PROPOFOL ;  Surgeon: Gladis POUR  Vicci, MD;  Location: [facility] ENDOSCOPY;  Service: Endoscopy;  Laterality: N/A;   ENDOVENOUS ABLATION SAPHENOUS VEIN W/ LASER Left 11/22/2017   endovenous laser ablation L SSV by Lynwood Collum MD    ESOPHAGEAL BRUSHING  06/23/2019   Procedure: ESOPHAGEAL BRUSHING;  Surgeon: Burnette Fallow, MD;  Location: [facility] ENDOSCOPY;  Service: Endoscopy;;   ESOPHAGOGASTRODUODENOSCOPY (EGD) WITH PROPOFOL  N/A 01/12/2019   Procedure: ESOPHAGOGASTRODUODENOSCOPY (EGD) WITH PROPOFOL ;  Surgeon: Donnald Charleston, MD;  Location: [facility] ENDOSCOPY;  Service: Endoscopy;  Laterality: N/A;  Patient is also scheduled for barium swallow; please notify  radiology after patient's EGD is complete so that barium swallow follows the endoscopy, not vice versa   ESOPHAGOGASTRODUODENOSCOPY (EGD) WITH PROPOFOL  N/A 06/23/2019   Procedure: ESOPHAGOGASTRODUODENOSCOPY (EGD) WITH PROPOFOL ;  Surgeon: Burnette Fallow, MD;  Location: [facility] ENDOSCOPY;  Service: Endoscopy;  Laterality: N/A;   PROSTATECTOMY  2008   REPAIR QUADRICEPS / HAMSTRING MUSCLE Right      Medications:   Current Facility-Administered Medications:    acetaminophen  (TYLENOL ) tablet 1,000 mg, 1,000 mg, Oral, Q8H, Perri DELENA Meliton Mickey., MD, 1,000 mg at 04/07/24 2355   albuterol  (PROVENTIL ) (2.5 MG/3ML) 0.083% nebulizer solution 2.5 mg, 2.5 mg, Nebulization, Q2H PRN, Fredirick Glenys RAMAN, MD   ARIPiprazole  (ABILIFY ) tablet 10 mg, 10 mg, Oral, Daily, Prunty, Donald B, DO, 10 mg at 04/08/24 9070   cyanocobalamin  (VITAMIN B12) tablet 500-1,000 mcg, 500-1,000 mcg, Oral, Daily, Fredirick Glenys RAMAN, MD, 1,000 mcg at 04/08/24 9070   diclofenac  Sodium (VOLTAREN ) 1 % topical gel 2 g, 2 g, Topical, QID, Perri DELENA Meliton Mickey., MD, 2 g at 04/08/24 0930   dicyclomine  (BENTYL ) capsule 10 mg, 10 mg, Oral, TID AC & HS, Perri DELENA Meliton Mickey., MD, 10 mg at 04/08/24 1201   enoxaparin  (LOVENOX ) injection 40 mg, 40 mg, Subcutaneous, Q24H, Fredirick Glenys RAMAN, MD, 40 mg at 04/07/24 9089   folic acid  (FOLVITE ) tablet 1 mg, 1 mg, Oral, Daily, Delsie Lynwood Morene Lavone, MD, 1 mg at 04/08/24 9071   gabapentin  (NEURONTIN ) capsule 300 mg, 300 mg, Oral, TID, Perri DELENA Meliton Mickey., MD, 300 mg at 04/08/24 9071   hydrOXYzine  (ATARAX ) tablet 25 mg, 25 mg, Oral, TID PRN, Fredirick Glenys RAMAN, MD, 25 mg at 04/08/24 9070   LORazepam  (ATIVAN ) injection 0-4 mg, 0-4 mg, Intravenous, Q12H **OR** LORazepam  (ATIVAN ) tablet 0-4 mg, 0-4 mg, Oral, Q12H, Pfeiffer, Marcy, MD   melatonin tablet 3 mg, 3 mg, Oral, QHS, Fredirick Glenys RAMAN, MD, 3 mg at 04/07/24 2356   metFORMIN  (GLUCOPHAGE ) tablet 500 mg, 500 mg, Oral, BID WC, Prunty, Donald B, DO, 500 mg at  04/07/24 1739   metoprolol  tartrate (LOPRESSOR ) injection 5 mg, 5 mg, Intravenous, Q6H PRN, Fredirick Glenys RAMAN, MD   morphine  (PF) 2 MG/ML injection 2 mg, 2 mg, Intravenous, Q2H PRN, Perri DELENA Meliton Mickey., MD, 2 mg at 04/08/24 1157   nicotine  (NICODERM CQ  - dosed in mg/24 hours) patch 14 mg, 14 mg, Transdermal, Daily, Fredirick Glenys RAMAN, MD, 14 mg at 04/08/24 1000   ondansetron  (ZOFRAN ) tablet 4 mg, 4 mg, Oral, Q6H PRN **OR** ondansetron  (ZOFRAN ) injection 4 mg, 4 mg, Intravenous, Q6H PRN, Fredirick Glenys RAMAN, MD   oxyCODONE  (Oxy IR/ROXICODONE ) immediate release tablet 5 mg, 5 mg, Oral, Q4H PRN **OR** oxyCODONE  (Oxy IR/ROXICODONE ) immediate release tablet 10 mg, 10 mg, Oral, Q4H PRN, Perri DELENA Meliton Mickey., MD, 10 mg at 04/08/24 0926   pantoprazole  (PROTONIX ) injection 40 mg, 40 mg, Intravenous, Q12H, Brahmbhatt, Parag, MD, 40 mg at 04/08/24  0941   polyethylene glycol (MIRALAX  / GLYCOLAX ) packet 17 g, 17 g, Oral, Daily PRN, Fredirick Glenys RAMAN, MD, 17 g at 04/06/24 1009   polyethylene glycol (MIRALAX  / GLYCOLAX ) packet 17 g, 17 g, Oral, BID, Perri DELENA Meliton Mickey., MD, 17 g at 04/07/24 2355   sertraline  (ZOLOFT ) tablet 200 mg, 200 mg, Oral, Daily, Fredirick Glenys RAMAN, MD, 200 mg at 04/08/24 9070   sucralfate  (CARAFATE ) tablet 1 g, 1 g, Oral, BID, Brahmbhatt, Parag, MD, 1 g at 04/08/24 9068   thiamine  (VITAMIN B1) tablet 100 mg, 100 mg, Oral, Daily, 100 mg at 04/08/24 0935 **OR** thiamine  (VITAMIN B1) injection 100 mg, 100 mg, Intravenous, Daily, Fredirick Glenys RAMAN, MD  Allergies: No Known Allergies  Jan DELENA Donath, MD

## 2024-04-08 NOTE — Interval H&P Note (Signed)
 History and Physical Interval Note:  04/08/2024 7:26 AM  [name].  has presented today for surgery, with the diagnosis of Dysphagia, abnormal barium swallow.  The various methods of treatment have been discussed with the patient and family. After consideration of risks, benefits and other options for treatment, the patient has consented to  Procedure(s): EGD (ESOPHAGOGASTRODUODENOSCOPY) (N/A) as a surgical intervention.  The patient's history has been reviewed, patient examined, no change in status, stable for surgery.  I have reviewed the patient's chart and labs.  Questions were answered to the patient's satisfaction.     Ericberto Padget

## 2024-04-08 NOTE — Progress Notes (Signed)
 PROGRESS NOTE    Todd [name].  FMW:[id number] DOB: 01/23/1956 DOA: 04/04/2024 PCP: Todd Trula SQUIBB, MD  Chief Complaint  Patient presents with   Knee Pain   Testicle Pain   Sore Throat    Brief Narrative:   Todd [name]. is Todd [name] 68 y.o. male with medical history significant of paroxysmal Todd [name] flutter, HTN, CHFpEF, polysubstance use, who admits to drinking at least 1 beer daily and reports being on chronic pain meds daily who presents to the ED with testicular pain on the left.  He notes the pains been there approximately 2 months.  He also reports bilateral knee pain.  He has some epigastric pain and history of esophageal ulcers though he denies hematemesis dark stools.  In the ED he had [name] negative scrotal Doppler and they noted no significant knee swelling.  Labs were significant for hyponatremia with sodium of 121.  We were asked to admit for hyponatremia.  Per patient he has had very limited food over the last 3 days.  He states the place he is living is not feeding him.   Assessment & Plan:   Principal Problem:   Hyponatremia Active Problems:   Non-insulin  dependent type 2 diabetes mellitus (HCC)   Affective bipolar disorder (HCC)   Primary hypertension   Alcohol abuse   Ulcerative esophagitis   Atrial flutter (HCC)   Acute bilateral knee pain   History of prostate cancer   Testicular pain, left  Dysphagia Esophagitis  Gastritis SLP eval - dysphagia 3, thin - appreciate further SLP recs Esophagram with mild tapered stricture in distal third of the esophagus, tertiary contractions in the distal third of the esophagus, mild hiatal hernia prior to GE junction GI c/s, appreciate assistance - s/p EGD today, biopsies taken.  PPI IV BID and 4 weeks PPI BID at discharge, followed by protonix  40 mg daily.  Sucralfate  BID x 2 weeks.  Dysphagia 3, ADAT.  Abdominal Pain CT 8/20 with possible ileus vs early low grade SBO Lipase wnl KUB with possible ileus Given persistent pain,  will get repeat CT abd/pelvis   Hyponatremia Suspect multifactorial.  History suggest poor PO intake and suspected hypovolemia.  Currently appears euvolemic.  Also, is on psychiatric meds that could cause SIADH (zoloft , trazodone  - he was taking trazodone  inappropriately).   TSH wnl.  Nondiagnostic cortisol.  ACTH  stim wnl. Na is improving, will continue to monitor. CXR 8/21 showed congestion with diffuse interstitial opacity suggesting edema  Strict I/O, daily weights   Acute bilateral knee pain Chronic Pain Add gabapentin  to regimen - increase as tolerated.  Scheduled APAP.  Voltaren . Continue oxycodone  (on chronic norco at home)  Plain films with mild tricompartmental OA, ossified intraarticular bodies in the suprapatellar space, patella baja (R knee).  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   Long discussion - we won't likely significantly improve his chronic pain here - 1x dose dex given 8/23 Will continue this  Left Testicular and Left Leg Pain Describes on and off pain for [name] year, no pain in back, but occasionally describes numbness to buttocks or scrotum.   CT abd/pelvis without explanation  Scrotal US  without acute abnormality - small R and trace L hydroceles MRI without acute findings or explanation for symptoms - mild disc bulging.  No spinal stenosis or nerve root encroachment.  Asymmetric R sided facet hypertrophy at L5-S1.  No c/o of pain today   Alcohol abuse CIWA protocol Patient  denies any history of DTs or seizures with not drinking.  HFpEF CXR concerning for edema Echo with EF 55-60% Clinically, doesn't appear overloaded - no crackles on exam, no edema Will monitor for now  Strict I/O, daily weights  Atrial flutter (HCC) Continue beta-blocker Not on anticoagulation    Ulcerative esophagitis PPI   History of prostate cancer Status post prostatectomy  Primary  hypertension Continue diltiazem  Lopressor  as needed   Affective bipolar disorder (HCC) Zoloft , abilify  - metformin  per psych   Hx Folate Def Folic acid   Non-insulin  dependent type 2 diabetes mellitus (HCC) Currently on no meds Carb modified diet Check A1c CBGs    DVT prophylaxis: lovenox  Code Status: ful Family Communication: none Disposition:   Status is: Inpatient Remains inpatient appropriate because: need for continued inpatient care   Consultants:  psych  Procedures:  none  Antimicrobials:  Anti-infectives (From admission, onward)    None       Subjective: C/o epigastric pain   Objective: Vitals:   04/08/24 0758 04/08/24 0800 04/08/24 0815 04/08/24 0835  BP: (!) 88/66 (!) 86/66 114/78 126/82  Pulse: 69 68 66 66  Resp: (!) 21 18 20 14   Temp: 97.9 F (36.6 C)  98.1 F (36.7 C) (!) 96.7 F (35.9 C)  TempSrc:    Rectal  SpO2: 96% 95% 94% 97%  Weight:      Height:        Intake/Output Summary (Last 24 hours) at 04/08/2024 1358 Last data filed at 04/08/2024 1100 Gross per 24 hour  Intake 270 ml  Output 800 ml  Net -530 ml   Filed Weights   04/04/24 1334  Weight: 75.8 kg    Examination:  General: No acute distress. Cardiovascular: RRR Lungs: unlabored Abdomen: mild epigastric discomfort Neurological: Alert and oriented 3. Moves all extremities 4 with equal strength. Cranial nerves II through XII grossly intact. Extremities: No clubbing or cyanosis. No edema.   Data Reviewed: I have personally reviewed following labs and imaging studies  CBC: Recent Labs  Lab 04/04/24 1405 04/05/24 0455 04/06/24 0413 04/07/24 0851 04/08/24 1007  WBC 10.6* 8.6 7.1 5.1 7.0  NEUTROABS 8.3*  --  5.0 3.1  --   HGB 16.3 13.6 13.1 13.5 14.2  HCT 47.0 39.5 38.9* 40.0 43.6  MCV 91.8 91.9 93.5 93.7 96.5  PLT 335 287 261 256 267    Basic Metabolic Panel: Recent Labs  Lab 04/05/24 0854 04/05/24 1656 04/06/24 0413 04/07/24 0851 04/08/24 1007   NA 126* 127* 130* 130* 132*  K 4.8 4.9 4.9 4.2 4.6  CL 90* 88* 99 98 96*  CO2 26 25 26 25 25   GLUCOSE 95 104* 85 98 113*  BUN 12 14 16 15 16   CREATININE 1.19 1.41* 1.23 1.26* 1.16  CALCIUM  9.2 9.5 9.8 9.3 10.0  MG  --   --  1.9 1.8 1.9  PHOS  --   --  3.0 3.7 3.9    GFR: Estimated Creatinine Clearance: 59 mL/min (by C-G formula based on SCr of 1.16 mg/dL).  Liver Function Tests: Recent Labs  Lab 04/04/24 1405 04/05/24 0455 04/06/24 0413 04/07/24 0851  AST 25 20 17 15   ALT 11 10 10 10   ALKPHOS 52 37* 38 38  BILITOT 0.7 0.6 0.8 0.6  PROT 8.4* 6.9 6.9 7.0  ALBUMIN 3.5 3.0* 2.9* 2.9*    CBG: Recent Labs  Lab 04/06/24 2118 04/07/24 0557 04/07/24 1138 04/07/24 1617 04/08/24 0800  GLUCAP 111* 90 140* 115* 111*  No results found for this or any previous visit (from the past 240 hours).       Radiology Studies: DG Abd 1 View Result Date: 04/07/2024 CLINICAL DATA:  Abdominal distension. EXAM: ABDOMEN - 1 VIEW COMPARISON:  CT 04/14/2024 FINDINGS: There is barium throughout the colon from yesterday's esophagram. Scattered air throughout prominent but not abnormally dilated in the central abdomen. No visible radiopaque calculi. Tacks in the lower abdomen from prior hernia repair. IMPRESSION: 1. Barium throughout the colon from yesterday's esophagram. 2. Scattered air throughout prominent but not abnormally dilated small bowel in the central abdomen, may represent ileus. Electronically Signed   By: Andrea Gasman M.D.   On: 04/07/2024 14:25        Scheduled Meds:  acetaminophen   1,000 mg Oral Q8H   ARIPiprazole   10 mg Oral Daily   cyanocobalamin   500-1,000 mcg Oral Daily   diclofenac  Sodium  2 g Topical QID   dicyclomine   10 mg Oral TID AC & HS   enoxaparin  (LOVENOX ) injection  40 mg Subcutaneous Q24H   folic acid   1 mg Oral Daily   gabapentin   300 mg Oral TID   LORazepam   0-4 mg Intravenous Q12H   Or   LORazepam   0-4 mg Oral Q12H   melatonin  3 mg Oral  QHS   metFORMIN   500 mg Oral BID WC   nicotine   14 mg Transdermal Daily   pantoprazole   40 mg Intravenous Q12H   polyethylene glycol  17 g Oral BID   sertraline   200 mg Oral Daily   sucralfate   1 g Oral BID   thiamine   100 mg Oral Daily   Or   thiamine   100 mg Intravenous Daily   Continuous Infusions:   LOS: 4 days    Time spent: over 30 min    Meliton Monte, MD [facility]   To contact the attending provider between 7A-7P or the covering provider during after hours 7P-7A, please log into the web site [email or web address] and access using universal [place] password for that web site. If you do not have the password, please call the hospital operator.  04/08/2024, 1:58 PM

## 2024-04-08 NOTE — Anesthesia Preprocedure Evaluation (Addendum)
 Anesthesia Evaluation  Patient identified by MRN, date of birth, ID band Patient awake    Reviewed: Allergy & Precautions, NPO status , Patient's Chart, lab work & pertinent test results  Airway Mallampati: III  TM Distance: >3 FB Neck ROM: Full    Dental  (+) Dental Advisory Given, Edentulous Upper, Missing   Pulmonary sleep apnea , Current Smoker and Patient abstained from smoking.   Pulmonary exam normal breath sounds clear to auscultation       Cardiovascular hypertension, Pt. on medications +CHF and + DVT  + dysrhythmias  Rhythm:Regular Rate:Bradycardia     Neuro/Psych  PSYCHIATRIC DISORDERS  Depression Bipolar Disorder   negative neurological ROS     GI/Hepatic PUD,GERD  Medicated,,(+) Hepatitis -Dysphagia, abnormal barium swallow   Endo/Other  diabetes, Type 2    Renal/GU negative Renal ROS     Musculoskeletal  (+) Arthritis ,    Abdominal   Peds  Hematology  (+) Blood dyscrasia, anemia   Anesthesia Other Findings Day of surgery medications reviewed with the patient.  Reproductive/Obstetrics                              Anesthesia Physical Anesthesia Plan  ASA: 3  Anesthesia Plan: [name]   Post-op Pain Management: Minimal or no pain anticipated   Induction: Intravenous  PONV Risk Score and Plan: 0 and TIVA and Treatment may vary due to age or medical condition  Airway Management Planned: Natural Airway and Simple Face Mask  Additional Equipment:   Intra-op Plan:   Post-operative Plan:   Informed Consent: I have reviewed the patients History and Physical, chart, labs and discussed the procedure including the risks, benefits and alternatives for the proposed anesthesia with the patient or authorized representative who has indicated his/her understanding and acceptance.     Dental advisory given  Plan Discussed with: CRNA  Anesthesia Plan Comments:           Anesthesia Quick Evaluation

## 2024-04-08 NOTE — Anesthesia Postprocedure Evaluation (Signed)
 Anesthesia Post Note  Patient: Todd [name].  Procedure(s) Performed: EGD (ESOPHAGOGASTRODUODENOSCOPY)     Patient location during evaluation: PACU Anesthesia Type: [name] Level of consciousness: awake and alert Pain management: pain level controlled Vital Signs Assessment: post-procedure vital signs reviewed and stable Respiratory status: spontaneous breathing, nonlabored ventilation and respiratory function stable Cardiovascular status: stable and blood pressure returned to baseline Postop Assessment: no apparent nausea or vomiting Anesthetic complications: no   No notable events documented.  Last Vitals:  Vitals:   04/08/24 0815 04/08/24 0835  BP: 114/78 126/82  Pulse: 66 66  Resp: 20 14  Temp: 36.7 C (!) 35.9 C  SpO2: 94% 97%    Last Pain:                 Garnette FORBES Skillern

## 2024-04-09 ENCOUNTER — Inpatient Hospital Stay (HOSPITAL_COMMUNITY)

## 2024-04-09 ENCOUNTER — Encounter (HOSPITAL_COMMUNITY): Payer: Self-pay | Admitting: Gastroenterology

## 2024-04-09 DIAGNOSIS — E871 Hypo-osmolality and hyponatremia: Secondary | ICD-10-CM | POA: Diagnosis not present

## 2024-04-09 LAB — CBC WITH DIFFERENTIAL/PLATELET
Abs Immature Granulocytes: 0.02 K/uL (ref 0.00–0.07)
Basophils Absolute: 0 K/uL (ref 0.0–0.1)
Basophils Relative: 1 %
Eosinophils Absolute: 0.2 K/uL (ref 0.0–0.5)
Eosinophils Relative: 4 %
HCT: 42.9 % (ref 39.0–52.0)
Hemoglobin: 13.8 g/dL (ref 13.0–17.0)
Immature Granulocytes: 0 %
Lymphocytes Relative: 34 %
Lymphs Abs: 1.9 K/uL (ref 0.7–4.0)
MCH: 31.3 pg (ref 26.0–34.0)
MCHC: 32.2 g/dL (ref 30.0–36.0)
MCV: 97.3 fL (ref 80.0–100.0)
Monocytes Absolute: 0.5 K/uL (ref 0.1–1.0)
Monocytes Relative: 9 %
Neutro Abs: 2.9 K/uL (ref 1.7–7.7)
Neutrophils Relative %: 52 %
Platelets: 250 K/uL (ref 150–400)
RBC: 4.41 MIL/uL (ref 4.22–5.81)
RDW: 13.3 % (ref 11.5–15.5)
WBC: 5.6 K/uL (ref 4.0–10.5)
nRBC: 0 % (ref 0.0–0.2)

## 2024-04-09 LAB — COMPREHENSIVE METABOLIC PANEL WITH GFR
ALT: 9 U/L (ref 0–44)
AST: 16 U/L (ref 15–41)
Albumin: 2.9 g/dL — ABNORMAL LOW (ref 3.5–5.0)
Alkaline Phosphatase: 39 U/L (ref 38–126)
Anion gap: 9 (ref 5–15)
BUN: 20 mg/dL (ref 8–23)
CO2: 22 mmol/L (ref 22–32)
Calcium: 9.9 mg/dL (ref 8.9–10.3)
Chloride: 103 mmol/L (ref 98–111)
Creatinine, Ser: 1.39 mg/dL — ABNORMAL HIGH (ref 0.61–1.24)
GFR, Estimated: 55 mL/min — ABNORMAL LOW (ref >60)
Glucose, Bld: 110 mg/dL — ABNORMAL HIGH (ref 70–99)
Potassium: 4.5 mmol/L (ref 3.5–5.1)
Sodium: 134 mmol/L — ABNORMAL LOW (ref 135–145)
Total Bilirubin: 0.4 mg/dL (ref 0.0–1.2)
Total Protein: 7.4 g/dL (ref 6.5–8.1)

## 2024-04-09 LAB — PHOSPHORUS: Phosphorus: 4.5 mg/dL (ref 2.5–4.6)

## 2024-04-09 LAB — MAGNESIUM: Magnesium: 1.8 mg/dL (ref 1.7–2.4)

## 2024-04-09 MED ORDER — IOHEXOL 350 MG/ML SOLN
75.0000 mL | Freq: Once | INTRAVENOUS | Status: AC | PRN
  Administered 2024-04-09: 75 mL via INTRAVENOUS

## 2024-04-09 MED ORDER — ENSURE PLUS HIGH PROTEIN PO LIQD
237.0000 mL | Freq: Two times a day (BID) | ORAL | Status: DC
  Administered 2024-04-09 – 2024-04-13 (×5): 237 mL via ORAL

## 2024-04-09 NOTE — Progress Notes (Signed)
 PROGRESS NOTE    Todd [name].  FMW:[id number] DOB: [birth year] DOA: 04/04/2024 PCP: Dwight Trula SQUIBB, MD  Chief Complaint  Patient presents with   Knee Pain   Testicle Pain   Sore Throat    Brief Narrative:   Todd [name]. is Todd [name] 68 y.o. male with medical history significant of paroxysmal Brieanne Mignone flutter, HTN, CHFpEF, polysubstance use, who admits to drinking at least 1 beer daily and reports being on chronic pain meds daily who presents to the ED with testicular pain on the left.  He notes the pains been there approximately 2 months.  He also reports bilateral knee pain.  He has some epigastric pain and history of esophageal ulcers though he denies hematemesis dark stools.  In the ED he had Lucciana Head negative scrotal Doppler and they noted no significant knee swelling.  Labs were significant for hyponatremia with sodium of 121.  We were asked to admit for hyponatremia.  Per patient he has had very limited food over the last 3 days.  He states the place he is living is not feeding him.   Assessment & Plan:   Principal Problem:   Hyponatremia Active Problems:   Non-insulin  dependent type 2 diabetes mellitus (HCC)   Affective bipolar disorder (HCC)   Primary hypertension   Alcohol abuse   Ulcerative esophagitis   Atrial flutter (HCC)   Acute bilateral knee pain   History of prostate cancer   Testicular pain, left  Dysphagia Esophagitis  Gastritis SLP eval - dysphagia 3, thin - appreciate further SLP recs Esophagram with mild tapered stricture in distal third of the esophagus, tertiary contractions in the distal third of the esophagus, mild hiatal hernia prior to GE junction GI c/s, appreciate assistance - s/p EGD today, biopsies taken.  PPI IV BID and 4 weeks PPI BID at discharge, followed by protonix  40 mg daily.  Sucralfate  BID x 2 weeks.  Dysphagia 3, ADAT. SLP today  Abdominal Pain CT 8/20 with possible ileus vs early low grade SBO Repeat CT scan without acute intraabdominal or  pelvic pathology Lipase wnl KUB with possible ileus Given persistent pain, will get repeat CT abd/pelvis   Hyponatremia Suspect multifactorial.  History suggest poor PO intake and suspected hypovolemia.  Currently appears euvolemic.  Also, is on psychiatric meds that could cause SIADH (zoloft , trazodone  - he was taking trazodone  inappropriately).   TSH wnl.  Nondiagnostic cortisol.  ACTH  stim wnl. Na is improving, will continue to monitor. CXR 8/21 showed congestion with diffuse interstitial opacity suggesting edema  Strict I/O, daily weights   Acute bilateral knee pain Chronic Pain Add gabapentin  to regimen - increase as tolerated.  Scheduled APAP.  Voltaren . Continue oxycodone  (on chronic norco at home)  Plain films with mild tricompartmental OA, ossified intraarticular bodies in the suprapatellar space, patella baja (R knee).  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   Long discussion - we won't likely significantly improve his chronic pain here - 1x dose dex given 8/23 Will continue this  Left Testicular and Left Leg Pain Describes on and off pain for Todd [name] year, no pain in back, but occasionally describes numbness to buttocks or scrotum.   CT abd/pelvis without explanation  Scrotal US  without acute abnormality - small R and trace L hydroceles MRI without acute findings or explanation for symptoms - mild disc bulging.  No spinal stenosis or nerve root encroachment.  Asymmetric R sided facet hypertrophy at L5-S1.  No  c/o of pain today   Alcohol abuse CIWA protocol Patient denies any history of DTs or seizures with not drinking.  HFpEF CXR concerning for edema Echo with EF 55-60% Clinically, doesn't appear overloaded - no crackles on exam, no edema Will monitor for now  Strict I/O, daily weights  Atrial flutter (HCC) Continue beta-blocker Not on anticoagulation    Ulcerative esophagitis PPI    History of prostate cancer Status post prostatectomy  Primary hypertension Continue diltiazem  Lopressor  as needed   Affective bipolar disorder (HCC) Zoloft , abilify  - metformin  per psych   Hx Folate Def Folic acid   Non-insulin  dependent type 2 diabetes mellitus (HCC) Currently on no meds Carb modified diet Check A1c CBGs    DVT prophylaxis: lovenox  Code Status: ful Family Communication: none Disposition:   Status is: Inpatient Remains inpatient appropriate because: need for continued inpatient care   Consultants:  psych  Procedures:  none  Antimicrobials:  Anti-infectives (From admission, onward)    None       Subjective: Continues to c/o abdominal pain   Objective: Vitals:   04/08/24 1946 04/08/24 2349 04/09/24 0407 04/09/24 0822  BP: 112/73 112/73 (!) 134/95 98/62  Pulse: (!) 59 (!) 56 (!) 57 68  Resp: 16 16 15 16   Temp: 98.2 F (36.8 C)  98.2 F (36.8 C) 98.5 F (36.9 C)  TempSrc:   Oral   SpO2: (!) 89% 91% 99% 93%  Weight:      Height:        Intake/Output Summary (Last 24 hours) at 04/09/2024 1334 Last data filed at 04/09/2024 1100 Gross per 24 hour  Intake 660 ml  Output 700 ml  Net -40 ml   Filed Weights   04/04/24 1334  Weight: 75.8 kg    Examination:  General: sleeping comfortably, when he woke up, c/o pain  Cardiovascular: RRR Lungs: unlabored Abdomen: Soft, mild epigastric pain Neurological: Alert and oriented 3. Moves all extremities 4 with equal strength. Cranial nerves II through XII grossly intact. Extremities: No clubbing or cyanosis. No edema.  Data Reviewed: I have personally reviewed following labs and imaging studies  CBC: Recent Labs  Lab 04/04/24 1405 04/05/24 0455 04/06/24 0413 04/07/24 0851 04/08/24 1007 04/09/24 0644  WBC 10.6* 8.6 7.1 5.1 7.0 5.6  NEUTROABS 8.3*  --  5.0 3.1  --  2.9  HGB 16.3 13.6 13.1 13.5 14.2 13.8  HCT 47.0 39.5 38.9* 40.0 43.6 42.9  MCV 91.8 91.9 93.5 93.7 96.5 97.3   PLT 335 287 261 256 267 250    Basic Metabolic Panel: Recent Labs  Lab 04/05/24 1656 04/06/24 0413 04/07/24 0851 04/08/24 1007 04/09/24 0644  NA 127* 130* 130* 132* 134*  K 4.9 4.9 4.2 4.6 4.5  CL 88* 99 98 96* 103  CO2 25 26 25 25 22   GLUCOSE 104* 85 98 113* 110*  BUN 14 16 15 16 20   CREATININE 1.41* 1.23 1.26* 1.16 1.39*  CALCIUM  9.5 9.8 9.3 10.0 9.9  MG  --  1.9 1.8 1.9 1.8  PHOS  --  3.0 3.7 3.9 4.5    GFR: Estimated Creatinine Clearance: 49.2 mL/min (Joram Venson) (by C-G formula based on SCr of 1.39 mg/dL (H)).  Liver Function Tests: Recent Labs  Lab 04/04/24 1405 04/05/24 0455 04/06/24 0413 04/07/24 0851 04/09/24 0644  AST 25 20 17 15 16   ALT 11 10 10 10 9   ALKPHOS 52 37* 38 38 39  BILITOT 0.7 0.6 0.8 0.6 0.4  PROT 8.4* 6.9 6.9 7.0  7.4  ALBUMIN 3.5 3.0* 2.9* 2.9* 2.9*    CBG: Recent Labs  Lab 04/06/24 2118 04/07/24 0557 04/07/24 1138 04/07/24 1617 04/08/24 0800  GLUCAP 111* 90 140* 115* 111*     No results found for this or any previous visit (from the past 240 hours).       Radiology Studies: CT ABDOMEN PELVIS W CONTRAST Result Date: 04/08/2024 CLINICAL DATA:  Abdominal pain. EXAM: CT ABDOMEN AND PELVIS WITH CONTRAST TECHNIQUE: Multidetector CT imaging of the abdomen and pelvis was performed using the standard protocol following bolus administration of intravenous contrast. RADIATION DOSE REDUCTION: This exam was performed according to the departmental dose-optimization program which includes automated exposure control, adjustment of the mA and/or kV according to patient size and/or use of iterative reconstruction technique. CONTRAST:  75mL OMNIPAQUE  IOHEXOL  350 MG/ML SOLN COMPARISON:  CT abdomen pelvis dated 04/04/2024. FINDINGS: Lower chest: Mild diffuse interstitial coarsening. No intra-abdominal free air or free fluid. Hepatobiliary: The liver is unremarkable. No biliary ductal dilatation. Cholecystectomy. Pancreas: Coarse calcification of the uncinate  process of the pancreas sequela of chronic pancreatitis. No dilatation of the main pancreatic duct or gland atrophy. Spleen: Normal in size without focal abnormality. Adrenals/Urinary Tract: The left adrenal glands unremarkable. There is Hardin Hardenbrook 2 cm indeterminate right adrenal nodule. Moderate bilateral renal parenchyma atrophy. Small bilateral renal cysts suboptimally evaluated on this CT. There is no hydronephrosis on either side. The visualized ureters and urinary bladder appear unremarkable. Stomach/Bowel: Dense oral contrast with associated streak artifact noted throughout the colon. There is diffuse colonic diverticulosis. There is no bowel obstruction or active inflammation. The appendix is normal. Vascular/Lymphatic: The abdominal aorta and IVC are unremarkable. No portal venous gas. There is no adenopathy. Reproductive: Prostatectomy. Other: Anterior pelvic wall hernia repair mesh. Musculoskeletal: Osteopenia with degenerative changes. No acute osseous pathology. IMPRESSION: 1. No acute intra-abdominal or pelvic pathology. 2. Colonic diverticulosis. No bowel obstruction. Normal appendix. Electronically Signed   By: Vanetta Chou M.D.   On: 04/08/2024 18:37        Scheduled Meds:  acetaminophen   1,000 mg Oral Q8H   ARIPiprazole   10 mg Oral Daily   cyanocobalamin   500-1,000 mcg Oral Daily   diclofenac  Sodium  2 g Topical QID   dicyclomine   10 mg Oral TID AC & HS   enoxaparin  (LOVENOX ) injection  40 mg Subcutaneous Q24H   feeding supplement  237 mL Oral BID BM   folic acid   1 mg Oral Daily   gabapentin   300 mg Oral TID   melatonin  3 mg Oral QHS   metFORMIN   500 mg Oral BID WC   nicotine   14 mg Transdermal Daily   pantoprazole   40 mg Intravenous Q12H   polyethylene glycol  17 g Oral BID   sertraline   200 mg Oral Daily   sucralfate   1 g Oral BID   thiamine   100 mg Oral Daily   Or   thiamine   100 mg Intravenous Daily   Continuous Infusions:   LOS: 5 days    Time spent: over 30  min    Meliton Monte, MD [facility]   To contact the attending provider between 7A-7P or the covering provider during after hours 7P-7A, please log into the web site [email or web address] and access using universal [place] password for that web site. If you do not have the password, please call the hospital operator.  04/09/2024, 1:34 PM

## 2024-04-09 NOTE — Progress Notes (Signed)
 Speech Language Pathology Treatment: Dysphagia  Patient Details Name: Todd [name]. MRN: [id number] DOB: [birth year] Today's Date: 04/09/2024 Time: 9090-9076 SLP Time Calculation (min) (ACUTE ONLY): 14 min  Assessment / Plan / Recommendation Clinical Impression  Pt reports no improvements in his dysphagia so far, although he did eat all of the mechanical soft solids on his breakfast tray this morning prior to SLP arrival. No overt signs of oropharyngeal dysphagia are noted during intake of thin liquids, but he still coughs with solids. Today this is observed after bites of puree, with pt pointing to his stomach, esophagus, and R side, indicating that he was in pain from swallowing. Continue to suspect a primary esophageal source (note that oropharyngeal swallow on esophagram was screened and considered to be normal), but pt does endorse subjective c/o difficulty at the level of his pharynx as well. He is interested in pursuing MBS to get a more comprehensive assessment of his swallowing. Will leave on current diet (Dys 3 solids, thin liquids) and f/u for MBS to see if further SLP interventions are indicated. Education was offered about esophageal precautions to use in the interim.    HPI HPI: Todd [name]. is a 68 y.o. male who presented to the ED with testicular pain on the left, bilateral knee pain, and epigastric pain. SLP consulted for pt c/o dysphagia. 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 EGD 8/24 showed severe esophagitis, lower esophageal stricture (nonobstructive), and gastritis. Previous swallowing testing includes esophagram in February 2025 with stasis of tablet as well as SLP eval 10/11/23 with recs for D3/thin. Pt with with medical history significant for paroxysmal a flutter, HTN, CHFpEF, polysubstance use,  who admits to drinking at least 1 beer daily and reports being on chronic pain meds daily.       SLP Plan  MBS          Recommendations  Diet recommendations: Dysphagia 3 (mechanical soft);Thin liquid Liquids provided via: Cup;Straw Medication Administration: Crushed with puree Supervision: Patient able to self feed Compensations: Slow rate;Small sips/bites;Follow solids with liquid Postural Changes and/or Swallow Maneuvers: Seated upright 90 degrees;Upright 30-60 min after meal                  Oral care BID     Dysphagia, unspecified (R13.10)     MBS     Leita SAILOR., M.A. CCC-SLP Acute Rehabilitation Services Office: [phone]  Secure chat preferred   04/09/2024, 9:27 AM

## 2024-04-09 NOTE — Plan of Care (Signed)
  Problem: Education: Goal: Knowledge of General Education information will improve Description: Including pain rating scale, medication(s)/side effects and non-pharmacologic comfort measures Outcome: Progressing   Problem: Health Behavior/Discharge Planning: Goal: Ability to manage health-related needs will improve Outcome: Progressing   Problem: Activity: Goal: Risk for activity intolerance will decrease Outcome: Progressing   Problem: Nutrition: Goal: Adequate nutrition will be maintained Outcome: Progressing   Problem: Coping: Goal: Level of anxiety will decrease Outcome: Progressing   Problem: Elimination: Goal: Will not experience complications related to bowel motility Outcome: Progressing Goal: Will not experience complications related to urinary retention Outcome: Progressing   Problem: Pain Managment: Goal: General experience of comfort will improve and/or be controlled Outcome: Progressing   Problem: Safety: Goal: Ability to remain free from injury will improve Outcome: Progressing

## 2024-04-09 NOTE — Evaluation (Signed)
 Modified Barium Swallow Study  Patient Details  Name: Todd [name]. MRN: [id number] Date of Birth: 1955-09-26  Today's Date: 04/09/2024  Modified Barium Swallow completed.  Full report located under Chart Review in the Imaging Section.  History of Present Illness Todd [name]. is a 68 y.o. male who presented to the ED with testicular pain on the left, bilateral knee pain, and epigastric pain. SLP consulted for pt c/o dysphagia. 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 EGD 8/24 showed severe esophagitis, lower esophageal stricture (nonobstructive), and gastritis. Previous swallowing testing includes esophagram in February 2025 with stasis of tablet as well as SLP eval 10/11/23 with recs for D3/thin. Pt with with medical history significant for paroxysmal a flutter, HTN, CHFpEF, polysubstance use, who admits to drinking at least 1 beer daily and reports being on chronic pain meds daily.   Clinical Impression Pt has a mild pharyngeal dysphagia including what appears to be a unilateral outpouching. In A/P view, this appears to be on his L side, and it is where residue remains post-swallow, even with liquids. Pt also has a delay in swallow initiation, spilling liquids into his pyriform sinuses where they briefly sit until he triggers a swallow. This altered timing contributes to trace amounts of aspiration (PAS 8) and penetration with thin liquids, as well as frank penetration (PAS 3) with nectar thick liquids. A cued cough helps to clear aspirates, and cues for single sips help to improve airway protection further (PAS 2). Pt denies any h/o PNA, and the above findings are not likely to be contributing to his presenting symptoms. He has c/o abdominal pain after each sip, also pointing toward his sternum at times. Recommend  that he continue with current diet with use of esophageal precautions as well as small, single sips.  Factors that may increase risk of adverse event in presence of aspiration Todd [name] & Todd [name] 2021):    Swallow Evaluation Recommendations Recommendations: PO diet PO Diet Recommendation: Dysphagia 3 (Mechanical soft);Thin liquids (Level 0) Liquid Administration via: Cup;Straw Medication Administration: Crushed with puree Supervision: Patient able to self-feed;Intermittent supervision/cueing for swallowing strategies Swallowing strategies  : Slow rate;Small bites/sips;Follow solids with liquids;Clear throat intermittently Postural changes: Position pt fully upright for meals;Stay upright 30-60 min after meals Oral care recommendations: Oral care BID (2x/day) Recommended consults: Consider GI consultation      Todd [name]., M.A. CCC-SLP Acute Rehabilitation Services Office: [phone]  Secure chat preferred  04/09/2024,2:43 PM

## 2024-04-10 DIAGNOSIS — E871 Hypo-osmolality and hyponatremia: Secondary | ICD-10-CM | POA: Diagnosis not present

## 2024-04-10 LAB — CBC WITH DIFFERENTIAL/PLATELET
Abs Immature Granulocytes: 0.04 K/uL (ref 0.00–0.07)
Basophils Absolute: 0 K/uL (ref 0.0–0.1)
Basophils Relative: 0 %
Eosinophils Absolute: 0.3 K/uL (ref 0.0–0.5)
Eosinophils Relative: 4 %
HCT: 40.2 % (ref 39.0–52.0)
Hemoglobin: 13.2 g/dL (ref 13.0–17.0)
Immature Granulocytes: 1 %
Lymphocytes Relative: 16 %
Lymphs Abs: 1.1 K/uL (ref 0.7–4.0)
MCH: 31.6 pg (ref 26.0–34.0)
MCHC: 32.8 g/dL (ref 30.0–36.0)
MCV: 96.2 fL (ref 80.0–100.0)
Monocytes Absolute: 0.7 K/uL (ref 0.1–1.0)
Monocytes Relative: 9 %
Neutro Abs: 5 K/uL (ref 1.7–7.7)
Neutrophils Relative %: 70 %
Platelets: 234 K/uL (ref 150–400)
RBC: 4.18 MIL/uL — ABNORMAL LOW (ref 4.22–5.81)
RDW: 13.5 % (ref 11.5–15.5)
WBC: 7.2 K/uL (ref 4.0–10.5)
nRBC: 0 % (ref 0.0–0.2)

## 2024-04-10 LAB — COMPREHENSIVE METABOLIC PANEL WITH GFR
ALT: 10 U/L (ref 0–44)
AST: 13 U/L — ABNORMAL LOW (ref 15–41)
Albumin: 2.7 g/dL — ABNORMAL LOW (ref 3.5–5.0)
Alkaline Phosphatase: 38 U/L (ref 38–126)
Anion gap: 11 (ref 5–15)
BUN: 22 mg/dL (ref 8–23)
CO2: 22 mmol/L (ref 22–32)
Calcium: 9.5 mg/dL (ref 8.9–10.3)
Chloride: 100 mmol/L (ref 98–111)
Creatinine, Ser: 1.23 mg/dL (ref 0.61–1.24)
GFR, Estimated: >60 mL/min (ref >60)
Glucose, Bld: 113 mg/dL — ABNORMAL HIGH (ref 70–99)
Potassium: 4.5 mmol/L (ref 3.5–5.1)
Sodium: 133 mmol/L — ABNORMAL LOW (ref 135–145)
Total Bilirubin: 0.7 mg/dL (ref 0.0–1.2)
Total Protein: 6.7 g/dL (ref 6.5–8.1)

## 2024-04-10 LAB — MAGNESIUM: Magnesium: 1.6 mg/dL — ABNORMAL LOW (ref 1.7–2.4)

## 2024-04-10 LAB — PHOSPHORUS: Phosphorus: 3.8 mg/dL (ref 2.5–4.6)

## 2024-04-10 MED ORDER — AMOXICILLIN-POT CLAVULANATE 875-125 MG PO TABS
1.0000 | ORAL_TABLET | Freq: Two times a day (BID) | ORAL | Status: DC
  Administered 2024-04-10 – 2024-04-13 (×7): 1 via ORAL
  Filled 2024-04-10 (×7): qty 1

## 2024-04-10 MED ORDER — MAGNESIUM SULFATE 2 GM/50ML IV SOLN
2.0000 g | Freq: Once | INTRAVENOUS | Status: AC
  Administered 2024-04-10: 2 g via INTRAVENOUS
  Filled 2024-04-10: qty 50

## 2024-04-10 NOTE — Care Management Important Message (Signed)
 Important Message  Patient Details  Name: Todd [name]. MRN: [id number] Date of Birth: [birth year]   Important Message Given:  Yes - Medicare IM     Jon Cruel 04/10/2024, 4:04 PM

## 2024-04-10 NOTE — TOC Progression Note (Signed)
 Transition of Care (TOC) - Progression Note    Patient Details  Name: Todd [name]. MRN: [id number] Date of Birth: Mar 17, 1956  Transition of Care [facility]) CM/SW Contact  Bridget Cordella Simmonds, LCSW Phone Number: 04/10/2024, 1:56 PM  Clinical Narrative:   Bed offer at [facility] provided to pt.  He is hesitant to accept offer outside of [place], wants to think about it.  CSW also updated APS CSW Nat Chang regarding this bed offer.     Expected Discharge Plan: Skilled Nursing Facility Barriers to Discharge: No Barriers Identified               Expected Discharge Plan and Services In-house Referral: Clinical Social Work Discharge Planning Services: CM Consult                                           Social Drivers of Health (SDOH) Interventions SDOH Screenings   Food Insecurity: Food Insecurity Present (04/07/2024)  Housing: High Risk (04/07/2024)  Transportation Needs: Unmet Transportation Needs (04/07/2024)  Utilities: Not At Risk (04/07/2024)  Alcohol Screen: Low Risk  (04/20/2023)  Financial Resource Strain: Low Risk  (10/27/2022)   Received from [facility]  Physical Activity: Inactive (10/27/2022)   Received from [facility]  Social Connections: Socially Isolated (04/07/2024)  Stress: Stress Concern Present (10/27/2022)   Received from [facility]  Tobacco Use: High Risk (04/08/2024)  Health Literacy: Low Risk  (10/27/2022)   [facility] from [facility]    Readmission Risk Interventions    10/11/2023    3:09 PM 09/02/2021    1:12 PM  Readmission Risk Prevention Plan  Transportation Screening Complete Complete  PCP or Specialist Appt within 3-5 Days Complete   HRI or Home Care Consult Complete Complete  Social Work Consult for Recovery Care Planning/Counseling Complete Complete  Palliative Care Screening Complete Not Applicable  Medication Review Ocean[employer]) Complete

## 2024-04-10 NOTE — Plan of Care (Signed)
   Problem: Clinical Measurements: Goal: Will remain free from infection Outcome: Progressing   Problem: Pain Managment: Goal: General experience of comfort will improve and/or be controlled Outcome: Progressing   Problem: Safety: Goal: Ability to remain free from injury will improve Outcome: Progressing

## 2024-04-10 NOTE — Progress Notes (Signed)
 Physical Therapy Treatment Patient Details Name: Todd [name]. MRN: [id number] DOB: [birth year] Today's Date: 04/10/2024   History of Present Illness 68 y.o. male who presents to the ED 8/20 with testicular pain on the left. Found to have Hyponatremia. PMH: paroxysmal a flutter, HTN, CHFpEF, polysubstance use, who admits to drinking at least 1 beer daily and reports being on chronic pain meds daily    PT Comments  Able to walk in room as on evaluation though needing increased help for bed mobility and sit to stand.  Patient limited by knee and abdominal pain though participating with encouragement.  Also encouraged to try to eat despite abdominal pain and pt with menu and glasses on to order at end of session.  PT will continue to follow.     If plan is discharge home, recommend the following: A little help with walking and/or transfers;A little help with bathing/dressing/bathroom;Assistance with cooking/housework;Assist for transportation;Help with stairs or ramp for entrance   Can travel by private vehicle        Equipment Recommendations  Rollator (4 wheels)    Recommendations for Other Services       Precautions / Restrictions Precautions Precautions: Fall Recall of Precautions/Restrictions: Intact     Mobility  Bed Mobility Overal bed mobility: Needs Assistance Bed Mobility: Supine to Sit, Sit to Supine     Supine to sit: Min assist, Used rails, HOB elevated Sit to supine: Supervision   General bed mobility comments: asking for hand hold to lift trunk upright then A to scoot hips, to supine pt managed on his own though PT assisted once supine to reposition shoulders, pt scooting up with rails on his own    Transfers Overall transfer level: Needs assistance Equipment used: Rolling walker (2 wheels) Transfers: Sit to/from Stand Sit to Stand: From elevated surface, Min assist           General transfer comment: attempted x 2 unsuccessful so min A provided to  allow for success    Ambulation/Gait Ambulation/Gait assistance: Min assist Gait Distance (Feet): 12 Feet Assistive device: Rolling walker (2 wheels) Gait Pattern/deviations: Step-through pattern, Decreased stride length, Knee flexed in stance - right, Knee flexed in stance - left, Shuffle, Trunk flexed       General Gait Details: short shuffling steps and pt leaning to sides to help with foot clearance, needing support for balance due to mild anterior bias   Stairs             Wheelchair Mobility     Tilt Bed    Modified Rankin (Stroke Patients Only)       Balance Overall balance assessment: Needs assistance Sitting-balance support: No upper extremity supported Sitting balance-Leahy Scale: Good     Standing balance support: Single extremity supported, Bilateral upper extremity supported, During functional activity, Reliant on assistive device for balance Standing balance-Leahy Scale: Poor Standing balance comment: moving hand to rail while stepping to head of bed                            Communication Communication Communication: Impaired Factors Affecting Communication: Reduced clarity of speech  Cognition Arousal: Alert Behavior During Therapy: WFL for tasks assessed/performed   PT - Cognitive impairments: History of cognitive impairments, Memory                       PT - Cognition Comments: could not remember when MD said was wrong  with his stomach Following commands: Intact      Cueing Cueing Techniques: Verbal cues  Exercises      General Comments General comments (skin integrity, edema, etc.): Encouraged to mobilize despite pain in abdomen and knees to allow improved LE strength      Pertinent Vitals/Pain Pain Assessment Pain Score: 8  Pain Location: abdomen & knees Pain Descriptors / Indicators: Grimacing, Sore, Aching Pain Intervention(s): Monitored during session, Repositioned, Heat applied    Home Living                           Prior Function            PT Goals (current goals can now be found in the care plan section) Progress towards PT goals: Progressing toward goals    Frequency    Min 1X/week      PT Plan      Co-evaluation              AM-PAC PT 6 Clicks Mobility   Outcome Measure  Help needed turning from your back to your side while in a flat bed without using bedrails?: A Little Help needed moving from lying on your back to sitting on the side of a flat bed without using bedrails?: A Little Help needed moving to and from a bed to a chair (including a wheelchair)?: A Little Help needed standing up from a chair using your arms (e.g., wheelchair or bedside chair)?: A Little Help needed to walk in hospital room?: A Little   6 Click Score: 15    End of Session Equipment Utilized During Treatment: Gait belt   Patient left: in bed;with call bell/phone within reach   PT Visit Diagnosis: Unsteadiness on feet (R26.81);Other abnormalities of gait and mobility (R26.89);Muscle weakness (generalized) (M62.81);Difficulty in walking, not elsewhere classified (R26.2);Pain Pain - Right/Left:  (both) Pain - part of body: Knee     Time: 1322-1340 PT Time Calculation (min) (ACUTE ONLY): 18 min  Charges:    $Gait Training: 8-22 mins PT General Charges $$ ACUTE PT VISIT: 1 Visit                     Micheline [name], PT Acute Rehabilitation Services Office:[phone] 04/10/2024    Todd [name] 04/10/2024, 1:52 PM

## 2024-04-10 NOTE — Progress Notes (Signed)
 Occupational Therapy Treatment Patient Details Name: Todd [name]. MRN: [id number] DOB: 10/02/1955 Today's Date: 04/10/2024   History of present illness 68 y.o. male who presents to the ED 8/20 with testicular pain on the left. Found to have Hyponatremia. PMH: paroxysmal a flutter, HTN, CHFpEF, polysubstance use, who admits to drinking at least 1 beer daily and reports being on chronic pain meds daily   OT comments  Pt in bed upon arrival and agreeable to OOB activity with gentle encouragement. Pt educated on ADL A/E for LB selfcare with video demo provided, handout provided. Pt adamantly declined sitting up in chair at end of session. OT will continue to follow acutely to maximize level of function and safety      If plan is discharge home, recommend the following:  A little help with walking and/or transfers;A lot of help with bathing/dressing/bathroom;Assistance with cooking/housework;Assist for transportation;Help with stairs or ramp for entrance;Direct supervision/assist for medications management;Direct supervision/assist for financial management   Equipment Recommendations  Other (comment) (defer)    Recommendations for Other Services      Precautions / Restrictions Precautions Precautions: Fall Recall of Precautions/Restrictions: Intact Restrictions Weight Bearing Restrictions Per Provider Order: No       Mobility Bed Mobility Overal bed mobility: Needs Assistance Bed Mobility: Supine to Sit, Sit to Supine     Supine to sit: Supervision, HOB elevated, Used rails Sit to supine: Supervision, HOB elevated, Used rails        Transfers Overall transfer level: Needs assistance Equipment used: Rolling walker (2 wheels) Transfers: Sit to/from Stand Sit to Stand: Contact guard assist     Step pivot transfers: Contact guard assist     General transfer comment: pt adamantly declined sitting up in chair at end of session     Balance Overall balance assessment:  Needs assistance Sitting-balance support: No upper extremity supported, Feet supported Sitting balance-Leahy Scale: Good     Standing balance support: Single extremity supported, Bilateral upper extremity supported, During functional activity, Reliant on assistive device for balance Standing balance-Leahy Scale: Poor                             ADL either performed or assessed with clinical judgement   ADL Overall ADL's : Needs assistance/impaired     Grooming: Wash/dry hands;Wash/dry face;Contact guard assist;Standing       Lower Body Bathing: Minimal assistance;Moderate assistance;Sitting/lateral leans;Cueing for compensatory techniques;Sit to/from stand       Lower Body Dressing: Minimal assistance;Moderate assistance;Cueing for compensatory techniques;Sitting/lateral leans;Sit to/from stand   Toilet Transfer: Contact guard assist;Ambulation;Rolling walker (2 wheels)   Toileting- Clothing Manipulation and Hygiene: Sitting/lateral lean;Sit to/from stand       Functional mobility during ADLs: Contact guard assist;Rolling walker (2 wheels);Cueing for safety General ADL Comments: pt educated on ADL A/E for LB selfcare with video demo provided, handout provided    Extremity/Trunk Assessment Upper Extremity Assessment Upper Extremity Assessment: Generalized weakness;Right hand dominant   Lower Extremity Assessment Lower Extremity Assessment: Defer to PT evaluation        Vision Baseline Vision/History: 1 Wears glasses Patient Visual Report: No change from baseline     Perception     Praxis     Communication Communication Communication: Impaired   Cognition Arousal: Alert Behavior During Therapy: WFL for tasks assessed/performed, Agitated  Following commands: Intact        Cueing   Cueing Techniques: Verbal cues, Gestural cues, Visual cues  Exercises      Shoulder Instructions       General  Comments      Pertinent Vitals/ Pain       Pain Assessment Pain Assessment: Faces Faces Pain Scale: Hurts even more Pain Location: abdomen Pain Descriptors / Indicators: Grimacing, Sore Pain Intervention(s): Monitored during session, Repositioned  Home Living                                          Prior Functioning/Environment              Frequency  Min 1X/week        Progress Toward Goals  OT Goals(current goals can now be found in the care plan section)  Progress towards OT goals: Progressing toward goals     Plan      Co-evaluation                 AM-PAC OT 6 Clicks Daily Activity     Outcome Measure   Help from another person eating meals?: None Help from another person taking care of personal grooming?: A Little Help from another person toileting, which includes using toliet, bedpan, or urinal?: A Little Help from another person bathing (including washing, rinsing, drying)?: A Lot Help from another person to put on and taking off regular upper body clothing?: A Little Help from another person to put on and taking off regular lower body clothing?: A Lot 6 Click Score: 17    End of Session Equipment Utilized During Treatment: Gait belt;Rolling walker (2 wheels)  OT Visit Diagnosis: Unsteadiness on feet (R26.81);Other abnormalities of gait and mobility (R26.89);Muscle weakness (generalized) (M62.81);Other symptoms and signs involving cognitive function;Pain Pain - part of body:  (ABD)   Activity Tolerance Patient tolerated treatment well;Patient limited by pain;Other (comment) (self limiting)   Patient Left in bed;with call bell/phone within reach   Nurse Communication Mobility status        Time: 8970-8945 OT Time Calculation (min): 25 min  Charges: OT General Charges $OT Visit: 1 Visit OT Treatments $Self Care/Home Management : 8-22 mins $Therapeutic Activity: 8-22 mins    Jacques Karna Loose 04/10/2024,  12:21 PM
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
## 2024-04-11 DIAGNOSIS — E871 Hypo-osmolality and hyponatremia: Secondary | ICD-10-CM | POA: Diagnosis not present

## 2024-04-11 LAB — CREATININE, SERUM
Creatinine, Ser: 1.49 mg/dL — ABNORMAL HIGH (ref 0.61–1.24)
GFR, Estimated: 51 mL/min — ABNORMAL LOW (ref >60)

## 2024-04-11 NOTE — Progress Notes (Signed)
 Speech Language Pathology Treatment: Dysphagia  Patient Details Name: Todd [name]. MRN: [id number] DOB: [birth year] Today's Date: 04/11/2024 Time: 9079-9064 SLP Time Calculation (min) (ACUTE ONLY): 15 min  Assessment / Plan / Recommendation    Clinical Impression  Skilled therapy session focused on dysphagia goals. SLP facilitated session by reviewing MBS results and f/u recommendations. SLP educated patient on pharyngoesophageal swallowing strategies including liquid wash, sitting upright at 90 degrees during and after meals and intermittent throat clears. SLP observed patient with PO including thin liquids, purees and solids. Patient endorses globus sensation and pain in sternum during all PO. Patient with immediate throat clears x1 during purees and solids. Patient reports liquid wash and sitting upright for extended periods of time eliminates pain. Recommend continuation of current diet with strict aspiration and esophageal precautions to reduce discomfort. Patient reports he has adequate consumption during mealtimes. Patient left in bed with call bell in reach. Continue POC    HPI HPI: Todd [name]. is a 68 y.o. male who presented to the ED with testicular pain on the left, bilateral knee pain, and epigastric pain. SLP consulted for pt c/o dysphagia. 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 EGD 8/24 showed severe esophagitis, lower esophageal stricture (nonobstructive), and gastritis. Previous swallowing testing includes esophagram in February 2025 with stasis of tablet as well as SLP eval 10/11/23 with recs for D3/thin. Pt with with medical history significant for paroxysmal a flutter, HTN, CHFpEF, polysubstance use, who admits to drinking at least 1 beer daily and reports being on chronic pain meds daily.       SLP Plan  Continue with current plan of care      Recommendations  Diet recommendations: Dysphagia 3 (mechanical soft);Thin liquid Liquids provided via: Cup;Straw Medication Administration: Crushed with puree Supervision: Patient able to self feed Compensations: Slow rate;Small sips/bites;Follow solids with liquid Postural Changes and/or Swallow Maneuvers: Seated upright 90 degrees;Upright 30-60 min after meal        Oral care BID     Dysphagia, pharyngoesophageal phase (R13.14)     Continue with current plan of care    Thatiana Renbarger M.A., CCC-SLP 04/11/2024, 9:37 AM

## 2024-04-11 NOTE — Plan of Care (Signed)
  Problem: Pain Managment: Goal: General experience of comfort will improve and/or be controlled Outcome: Progressing   Problem: Safety: Goal: Ability to remain free from injury will improve Outcome: Progressing

## 2024-04-11 NOTE — Progress Notes (Signed)
 PROGRESS NOTE  Todd [name]. FMW:[id number] DOB: 17-Sep-1955 DOA: 04/04/2024 PCP: Dwight Trula SQUIBB, MD   LOS: 7 days   Brief Narrative / Interim history: 68 y.o. male with medical history significant of paroxysmal a flutter, HTN, CHFpEF, polysubstance use, who admits to drinking at least 1 beer daily and reports being on chronic pain meds daily who presents to the ED with testicular pain on the left.  He notes the pains been there approximately 2 months.  He also reports bilateral knee pain.  He has some epigastric pain and history of esophageal ulcers though he denies hematemesis dark stools.  In the ED he had a negative scrotal Doppler and they noted no significant knee swelling.  Labs were significant for hyponatremia with sodium of 121.  We were asked to admit for hyponatremia.  Per patient he has had very limited food over the last 3 days.  He states the place he is living is not feeding him. He's had extensive workup to this time.  Now s/p EGD for dysphagia which was notable for esophagitis/gastritis/duodenitis. He's continuing to complain of pain which has not been clearly explained by the workup to this point.  Has chronic knee pain and is also c/o abdominal pain.  Currently working on SNF placement.   Subjective / 24h Interval events: Multiple complaints, abdominal pain, knee pain, no shortness of breath, nausea, vomiting, fever or chills  Assesement and Plan: Principal Problem:   Hyponatremia Active Problems:   Non-insulin  dependent type 2 diabetes mellitus (HCC)   Affective bipolar disorder (HCC)   Primary hypertension   Alcohol abuse   Ulcerative esophagitis   Atrial flutter (HCC)   Acute bilateral knee pain   History of prostate cancer   Testicular pain, left   Principal problem Multifocal Pneumonia - Treating with augmentin , patient continues to complain of a persistent cough.  He is on room air, not hypoxic.  Continue Augmentin   Active problems Dysphagia, Esophagitis   Gastritis  Duodenitis - SLP eval - dysphagia 3, thin - s/p MBS - mild pharyngeal dysphagia including unilateral outpouching - delay in sawllow initiation, spilling liquids into pyriform sinuses where they sit until he triggers a swallow - altered timing contributes to trace amounts of aspiration and penetration with thin liquids as well as frank penetration with nectar thick liquids (see 8/25 SLP note). Esophagram with mild tapered stricture in distal third of the esophagus, tertiary contractions in the distal third of the esophagus, mild hiatal hernia prior to GE junction.  GI consulted, underwent an EGD which showed grade D erosive esophagitis, chronic gastritis, duodenitis, biopsies taken.  PPI IV BID and 4 weeks PPI BID at discharge, followed by protonix  40 mg daily.  Sucralfate  BID x 2 weeks.    Abdominal Pain -CT 8/20 with possible ileus vs early low grade SBO, however on repeat CT scan there was no acute intra-abdominal or pelvic pathologies.  Lipase was normal, it is unclear what is causing his pain, possibly related to his esophagitis/gastritis as above  Hyponatremia - Suspect multifactorial.  History suggest poor PO intake and suspected hypovolemia.  Currently appears euvolemic.  Also, is on psychiatric meds that could cause SIADH (zoloft , trazodone  - he was taking trazodone  inappropriately). TSH wnl.  Nondiagnostic cortisol.  ACTH  stim wnl. -Sodium is improving   Acute bilateral knee pain, chronic Pain - Add gabapentin  to regimen - increase as tolerated.  Scheduled APAP.  Voltaren . Continue oxycodone  (on chronic norco at home). Plain films with mild tricompartmental OA, ossified  intraarticular bodies in the suprapatellar space, patella baja (R knee).  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    Left Testicular and Left Leg Pain - Describes on and off pain for a year, no pain in back, but occasionally describes numbness  to buttocks or scrotum.   CT abd/pelvis without explanation. Scrotal US  without acute abnormality - small R and trace L hydroceles. MRI L spine without acute findings or explanation for symptoms - mild disc bulging.  No spinal stenosis or nerve root encroachment.  Asymmetric R sided facet hypertrophy at L5-S1.    Affective bipolar disorder (HCC) - Zoloft , abilify  - metformin  per psych He was inappropriately taking trazodone  (taking 7+ pills - when asked if he was trying to hurt himself, he said more or less, I didn't want to face the day).  Psych has now signed off    Alcohol abuse, Substance Abuse - CIWA protocol. Patient denies any history of DTs or seizures with not drinking. His wife (separated), Candace [phone]) notes chronic issues with substance abuse/opiates   HFpEF -appears euvolemic   Atrial flutter (HCC) - Continue beta-blocker. Not on anticoagulation    Ulcerative esophagitis - PPI   History of prostate cancer - Status post prostatectomy   Primary hypertension - Continue diltiazem .  Blood pressure stable   Hx Folate Def - Folic acid    Non-insulin  dependent type 2 diabetes mellitus (HCC) - Currently on no meds Carb modified diet. Check A1c 5.7  Scheduled Meds:  acetaminophen   1,000 mg Oral Q8H   amoxicillin -clavulanate  1 tablet Oral Q12H   ARIPiprazole   10 mg Oral Daily   cyanocobalamin   500-1,000 mcg Oral Daily   diclofenac  Sodium  2 g Topical QID   dicyclomine   10 mg Oral TID AC & HS   enoxaparin  (LOVENOX ) injection  40 mg Subcutaneous Q24H   feeding supplement  237 mL Oral BID BM   folic acid   1 mg Oral Daily   gabapentin   300 mg Oral TID   melatonin  3 mg Oral QHS   metFORMIN   500 mg Oral BID WC   nicotine   14 mg Transdermal Daily   pantoprazole   40 mg Intravenous Q12H   polyethylene glycol  17 g Oral BID   sertraline   200 mg Oral Daily   sucralfate   1 g Oral BID   thiamine   100 mg Oral Daily   Or   thiamine   100 mg Intravenous Daily   Continuous  Infusions: PRN Meds:.albuterol , hydrOXYzine , ondansetron  **OR** ondansetron  (ZOFRAN ) IV, oxyCODONE  **OR** oxyCODONE , polyethylene glycol  Current Outpatient Medications  Medication Instructions   ACETAMINOPHEN  PO 3-4 tablets, Oral, Every 6 hours PRN   allopurinol (ZYLOPRIM) 200 mg, Daily   diltiazem  (CARDIZEM  CD) 180 mg, Oral, Daily   DULoxetine  (CYMBALTA ) 40 mg, Oral, Daily   HYDROcodone -acetaminophen  (NORCO) 10-325 MG tablet 1 tablet, Oral, 4 times daily PRN   hydrOXYzine  (ATARAX ) 25 mg, Oral, 3 times daily PRN   melatonin 3 mg, Daily at bedtime   pantoprazole  (PROTONIX ) 40 mg, Oral, 2 times daily   sertraline  (ZOLOFT ) 200 mg, Daily   trazodone  (DESYREL ) 300 mg, Daily at bedtime   vitamin B-12 (CYANOCOBALAMIN ) 500-1,000 mcg, Oral, Daily   Vitamin D (Ergocalciferol) (DRISDOL) 50,000 Units, Every 7 days    Diet Orders (From admission, onward)     Start     Ordered   04/08/24 0751  DIET DYS 3 Room service appropriate? Yes; Fluid consistency: Thin  Diet effective now       Question Answer Comment  Room service appropriate? Yes   Fluid consistency: Thin      04/08/24 0750            DVT prophylaxis: enoxaparin  (LOVENOX ) injection 40 mg Start: 04/05/24 1000   Lab Results  Component Value Date   PLT 234 04/10/2024      Code Status: Full Code  Family Communication: no family at bedside   Status is: Inpatient Remains inpatient appropriate because: Awaiting placement   Level of care: Med-Surg  Consultants:  none  Objective: Vitals:   04/10/24 1921 04/10/24 2339 04/11/24 0522 04/11/24 0715  BP: 135/79  109/82 102/74  Pulse: 87  70 68  Resp: 13  16 16   Temp: 98.6 F (37 C)  98.1 F (36.7 C) 98.6 F (37 C)  TempSrc:    Oral  SpO2: 92% 93% 100% 91%  Weight:      Height:        Intake/Output Summary (Last 24 hours) at 04/11/2024 1239 Last data filed at 04/11/2024 1100 Gross per 24 hour  Intake 1120 ml  Output 150 ml  Net 970 ml   Wt Readings from Last 3  Encounters:  04/04/24 75.8 kg  01/11/24 75.8 kg  10/28/23 93.3 kg    Examination:  Constitutional: NAD Eyes: no scleral icterus ENMT: Mucous membranes are moist.  Neck: normal, supple Respiratory: clear to auscultation bilaterally, no wheezing, no crackles.  Cardiovascular: Regular rate and rhythm, no murmurs / rubs / gallops. N Abdomen: non distended. Bowel sounds positive.  Musculoskeletal: no clubbing / cyanosis.  Skin: no rashes Neurologic: non focal   Data Reviewed: I have independently reviewed following labs and imaging studies   CBC Recent Labs  Lab 04/04/24 1405 04/05/24 0455 04/06/24 0413 04/07/24 0851 04/08/24 1007 04/09/24 0644 04/10/24 0605  WBC 10.6*   < > 7.1 5.1 7.0 5.6 7.2  HGB 16.3   < > 13.1 13.5 14.2 13.8 13.2  HCT 47.0   < > 38.9* 40.0 43.6 42.9 40.2  PLT 335   < > 261 256 267 250 234  MCV 91.8   < > 93.5 93.7 96.5 97.3 96.2  MCH 31.8   < > 31.5 31.6 31.4 31.3 31.6  MCHC 34.7   < > 33.7 33.8 32.6 32.2 32.8  RDW 12.8   < > 13.2 13.2 13.1 13.3 13.5  LYMPHSABS 1.5  --  1.3 1.3  --  1.9 1.1  MONOABS 0.6  --  0.7 0.6  --  0.5 0.7  EOSABS 0.0  --  0.1 0.1  --  0.2 0.3  BASOSABS 0.0  --  0.0 0.0  --  0.0 0.0   < > = values in this interval not displayed.    Recent Labs  Lab 04/04/24 2126 04/05/24 0455 04/05/24 0854 04/06/24 0413 04/07/24 0851 04/08/24 1007 04/09/24 0644 04/10/24 0605 04/11/24 0503  NA 124* 123*   < > 130* 130* 132* 134* 133*  --   K 5.3* 5.0   < > 4.9 4.2 4.6 4.5 4.5  --   CL 86* 88*   < > 99 98 96* 103 100  --   CO2 25 24   < > 26 25 25 22 22   --   GLUCOSE 98 95   < > 85 98 113* 110* 113*  --   BUN 11 12   < > 16 15 16 20 22   --  CREATININE 1.40* 1.38*   < > 1.23 1.26* 1.16 1.39* 1.23 1.49*  CALCIUM  8.9 9.1   < > 9.8 9.3 10.0 9.9 9.5  --   AST  --  20  --  17 15  --  16 13*  --   ALT  --  10  --  10 10  --  9 10  --   ALKPHOS  --  37*  --  38 38  --  39 38  --   BILITOT  --  0.6  --  0.8 0.6  --  0.4 0.7  --    ALBUMIN  --  3.0*  --  2.9* 2.9*  --  2.9* 2.7*  --   MG  --   --   --  1.9 1.8 1.9 1.8 1.6*  --   TSH 1.407  --   --   --   --   --   --   --   --   HGBA1C 5.7*  --   --   --   --   --   --   --   --   BNP  --  56.6  --   --   --   --   --   --   --    < > = values in this interval not displayed.    ------------------------------------------------------------------------------------------------------------------ No results for input(s): CHOL, HDL, LDLCALC, TRIG, CHOLHDL, LDLDIRECT in the last 72 hours.  Lab Results  Component Value Date   HGBA1C 5.7 (H) 04/04/2024   ------------------------------------------------------------------------------------------------------------------ No results for input(s): TSH, T4TOTAL, T3FREE, THYROIDAB in the last 72 hours.  Invalid input(s): FREET3  Cardiac Enzymes No results for input(s): CKMB, TROPONINI, MYOGLOBIN in the last 168 hours.  Invalid input(s): CK ------------------------------------------------------------------------------------------------------------------    Component Value Date/Time   BNP 56.6 04/05/2024 0455    CBG: Recent Labs  Lab 04/06/24 2118 04/07/24 0557 04/07/24 1138 04/07/24 1617 04/08/24 0800  GLUCAP 111* 90 140* 115* 111*    No results found for this or any previous visit (from the past 240 hours).   Radiology Studies: No results found.   Nilda Fendt, MD, PhD [facility]  Between 7 am - 7 pm I am available, please contact me via Amion (for emergencies) or Securechat (non urgent messages)  Between 7 pm - 7 am I am not available, please contact night coverage MD/APP via Amion

## 2024-04-11 NOTE — TOC Progression Note (Signed)
 Transition of Care (TOC) - Progression Note    Patient Details  Name: Todd [name]. MRN: [id number] Date of Birth: 1956/05/20  Transition of Care [facility]) CM/SW Contact  Bridget Cordella Simmonds, LCSW Phone Number: 04/11/2024, 2:58 PM  Clinical Narrative:   Second bed offer at [facility] received.  CSW spoke with pt and provided this bed offer.  Pt is not excited about either offer, as he hoped to stay in [place].  CSW updated Nat Draper/APS, she spoke with pt as well.  Pt wants to discuss with his wife before choosing facility.      Expected Discharge Plan: Skilled Nursing Facility Barriers to Discharge: No Barriers Identified               Expected Discharge Plan and Services In-house Referral: Clinical Social Work Discharge Planning Services: CM Consult                                           Social Drivers of Health (SDOH) Interventions SDOH Screenings   Food Insecurity: Food Insecurity Present (04/07/2024)  Housing: High Risk (04/07/2024)  Transportation Needs: Unmet Transportation Needs (04/07/2024)  Utilities: Not At Risk (04/07/2024)  Alcohol Screen: Low Risk  (04/20/2023)  Financial Resource Strain: Low Risk  (10/27/2022)   [facility]  Physical Activity: Inactive (10/27/2022)   Received from [facility]  Social Connections: Socially Isolated (04/07/2024)  Stress: Stress Concern Present (10/27/2022)   Received from [facility]  Tobacco Use: High Risk (04/08/2024)  Health Literacy: Low Risk  (10/27/2022)   Received from [facility]    Readmission Risk Interventions    10/11/2023    3:09 PM 09/02/2021    1:12 PM  Readmission Risk Prevention Plan  Transportation Screening Complete Complete  PCP or Specialist Appt within 3-5 Days Complete   HRI or Home Care Consult Complete Complete  Social Work Consult for Recovery Care Planning/Counseling Complete Complete  Palliative Care Screening Complete Not Applicable   Medication Review Oceanographer) Complete

## 2024-04-11 NOTE — Progress Notes (Signed)
 Pt. Voiced concern that his wallet was sent to laundry, This nurse searched in room and did not find wallet. Spoke with wife via pone she stated, she placed it under the bed pad. Pt stated that sheets were change the shift before. The nurse explain the policy on personal belongs to pt. and wife. I also reached out to laundry and they are aware.

## 2024-04-12 DIAGNOSIS — E871 Hypo-osmolality and hyponatremia: Secondary | ICD-10-CM | POA: Diagnosis not present

## 2024-04-12 LAB — CBC
HCT: 41.8 % (ref 39.0–52.0)
Hemoglobin: 13.5 g/dL (ref 13.0–17.0)
MCH: 31.4 pg (ref 26.0–34.0)
MCHC: 32.3 g/dL (ref 30.0–36.0)
MCV: 97.2 fL (ref 80.0–100.0)
Platelets: 218 K/uL (ref 150–400)
RBC: 4.3 MIL/uL (ref 4.22–5.81)
RDW: 13.5 % (ref 11.5–15.5)
WBC: 4.7 K/uL (ref 4.0–10.5)
nRBC: 0 % (ref 0.0–0.2)

## 2024-04-12 LAB — BASIC METABOLIC PANEL WITH GFR
Anion gap: 11 (ref 5–15)
BUN: 26 mg/dL — ABNORMAL HIGH (ref 8–23)
CO2: 23 mmol/L (ref 22–32)
Calcium: 9.5 mg/dL (ref 8.9–10.3)
Chloride: 100 mmol/L (ref 98–111)
Creatinine, Ser: 1.28 mg/dL — ABNORMAL HIGH (ref 0.61–1.24)
GFR, Estimated: >60 mL/min (ref >60)
Glucose, Bld: 92 mg/dL (ref 70–99)
Potassium: 4.7 mmol/L (ref 3.5–5.1)
Sodium: 134 mmol/L — ABNORMAL LOW (ref 135–145)

## 2024-04-12 LAB — SURGICAL PATHOLOGY

## 2024-04-12 LAB — MAGNESIUM: Magnesium: 2.1 mg/dL (ref 1.7–2.4)

## 2024-04-12 NOTE — Progress Notes (Signed)
 PROGRESS NOTE  Todd [name]. FMW:[id number] DOB: Jan 22, 1956 DOA: 04/04/2024 PCP: Todd Trula SQUIBB, MD   LOS: 8 days   Brief Narrative / Interim history: 68 y.o. male with medical history significant of paroxysmal a flutter, HTN, CHFpEF, polysubstance use, who admits to drinking at least 1 beer daily and reports being on chronic pain meds daily who presents to the ED with testicular pain on the left.  He notes the pains been there approximately 2 months.  He also reports bilateral knee pain.  He has some epigastric pain and history of esophageal ulcers though he denies hematemesis dark stools.  In the ED he had a negative scrotal Doppler and they noted no significant knee swelling.  Labs were significant for hyponatremia with sodium of 121.  We were asked to admit for hyponatremia.  Per patient he has had very limited food over the last 3 days.  He states the place he is living is not feeding him. He's had extensive workup to this time.  Now s/p EGD for dysphagia which was notable for esophagitis/gastritis/duodenitis. He's continuing to complain of pain which has not been clearly explained by the workup to this point.  Has chronic knee pain and is also c/o abdominal pain.  Currently working on SNF placement.   Subjective / 24h Interval events: Doing better today   Assesement and Plan: Principal Problem:   Hyponatremia Active Problems:   Non-insulin  dependent type 2 diabetes mellitus (HCC)   Affective bipolar disorder (HCC)   Primary hypertension   Alcohol abuse   Ulcerative esophagitis   Atrial flutter (HCC)   Acute bilateral knee pain   History of prostate cancer   Testicular pain, left   Principal problem Multifocal Pneumonia - Treating with augmentin , patient continues to complain of a persistent cough.  He is on room air, not hypoxic.  Continue augmentin   Active problems Dysphagia, Esophagitis  Gastritis  Duodenitis - SLP eval - dysphagia 3, thin - s/p MBS - mild pharyngeal  dysphagia including unilateral outpouching - delay in sawllow initiation, spilling liquids into pyriform sinuses where they sit until he triggers a swallow - altered timing contributes to trace amounts of aspiration and penetration with thin liquids as well as frank penetration with nectar thick liquids (see 8/25 SLP note). Esophagram with mild tapered stricture in distal third of the esophagus, tertiary contractions in the distal third of the esophagus, mild hiatal hernia prior to GE junction.  GI consulted, underwent an EGD which showed grade D erosive esophagitis, chronic gastritis, duodenitis, biopsies taken.  PPI IV BID and 4 weeks PPI BID at discharge, followed by protonix  40 mg daily.  Sucralfate  BID x 2 weeks.    Abdominal Pain -CT 8/20 with possible ileus vs early low grade SBO, however on repeat CT scan there was no acute intra-abdominal or pelvic pathologies.  Lipase was normal, it is unclear what is causing his pain, possibly related to his esophagitis/gastritis as above  Hyponatremia - Suspect multifactorial.  History suggest poor PO intake and suspected hypovolemia.  Currently appears euvolemic.  Also, is on psychiatric meds that could cause SIADH (zoloft , trazodone  - he was taking trazodone  inappropriately). TSH wnl.  Nondiagnostic cortisol.  ACTH  stim wnl. -Sodium improving    Acute bilateral knee pain, chronic Pain - Add gabapentin  to regimen - increase as tolerated.  Scheduled APAP.  Voltaren . Continue oxycodone  (on chronic norco at home). Plain films with mild tricompartmental OA, ossified intraarticular bodies in the suprapatellar space, patella baja (R knee).  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    Left Testicular and Left Leg Pain - Describes on and off pain for a year, no pain in back, but occasionally describes numbness to buttocks or scrotum.   CT abd/pelvis without explanation. Scrotal US  without  acute abnormality - small R and trace L hydroceles. MRI L spine without acute findings or explanation for symptoms - mild disc bulging.  No spinal stenosis or nerve root encroachment.  Asymmetric R sided facet hypertrophy at L5-S1.    Affective bipolar disorder (HCC) - Zoloft , abilify  - metformin  per psych He was inappropriately taking trazodone  (taking 7+ pills - when asked if he was trying to hurt himself, he said more or less, I didn't want to face the day).  Psych has now signed off    Alcohol abuse, Substance Abuse - CIWA protocol. Patient denies any history of DTs or seizures with not drinking. His wife (separated), Candace [phone]) notes chronic issues with substance abuse/opiates   HFpEF -appears euvolemic   Atrial flutter (HCC) - Continue beta-blocker. Not on anticoagulation    Ulcerative esophagitis - PPI   History of prostate cancer - Status post prostatectomy   Primary hypertension - Continue diltiazem .  Blood pressure stable   Hx Folate Def - Folic acid    Non-insulin  dependent type 2 diabetes mellitus (HCC) - Currently on no meds Carb modified diet. Check A1c 5.7  Scheduled Meds:  acetaminophen   1,000 mg Oral Q8H   amoxicillin -clavulanate  1 tablet Oral Q12H   ARIPiprazole   10 mg Oral Daily   cyanocobalamin   500-1,000 mcg Oral Daily   diclofenac  Sodium  2 g Topical QID   dicyclomine   10 mg Oral TID AC & HS   enoxaparin  (LOVENOX ) injection  40 mg Subcutaneous Q24H   feeding supplement  237 mL Oral BID BM   folic acid   1 mg Oral Daily   gabapentin   300 mg Oral TID   melatonin  3 mg Oral QHS   metFORMIN   500 mg Oral BID WC   nicotine   14 mg Transdermal Daily   pantoprazole   40 mg Intravenous Q12H   polyethylene glycol  17 g Oral BID   sertraline   200 mg Oral Daily   sucralfate   1 g Oral BID   thiamine   100 mg Oral Daily   Or   thiamine   100 mg Intravenous Daily   Continuous Infusions: PRN Meds:.albuterol , hydrOXYzine , ondansetron  **OR** ondansetron   (ZOFRAN ) IV, oxyCODONE  **OR** oxyCODONE , polyethylene glycol  Current Outpatient Medications  Medication Instructions   ACETAMINOPHEN  PO 3-4 tablets, Oral, Every 6 hours PRN   allopurinol (ZYLOPRIM) 200 mg, Daily   diltiazem  (CARDIZEM  CD) 180 mg, Oral, Daily   DULoxetine  (CYMBALTA ) 40 mg, Oral, Daily   HYDROcodone -acetaminophen  (NORCO) 10-325 MG tablet 1 tablet, Oral, 4 times daily PRN   hydrOXYzine  (ATARAX ) 25 mg, Oral, 3 times daily PRN   melatonin 3 mg, Daily at bedtime   pantoprazole  (PROTONIX ) 40 mg, Oral, 2 times daily   sertraline  (ZOLOFT ) 200 mg, Daily   trazodone  (DESYREL ) 300 mg, Daily at bedtime   vitamin B-12 (CYANOCOBALAMIN ) 500-1,000 mcg, Oral, Daily   Vitamin D (Ergocalciferol) (DRISDOL) 50,000 Units, Every 7 days    Diet Orders (From admission, onward)     Start     Ordered   04/08/24 0751  DIET DYS 3 Room service appropriate? Yes; Fluid consistency: Thin  Diet effective now       Question Answer  Comment  Room service appropriate? Yes   Fluid consistency: Thin      04/08/24 0750            DVT prophylaxis: enoxaparin  (LOVENOX ) injection 40 mg Start: 04/05/24 1000   Lab Results  Component Value Date   PLT 218 04/12/2024      Code Status: Full Code  Family Communication: no family at bedside   Status is: Inpatient Remains inpatient appropriate because: Awaiting placement   Level of care: Med-Surg  Consultants:  none  Objective: Vitals:   04/11/24 2015 04/12/24 0500 04/12/24 0806 04/12/24 1345  BP: 125/86 136/66 111/81 111/79  Pulse: 73 70 (!) 59 66  Resp: 16 16 16 17   Temp: 99.1 F (37.3 C) 98.6 F (37 C) 97.8 F (36.6 C) 98 F (36.7 C)  TempSrc: Oral Oral Oral Oral  SpO2: 94% 95% 97% 93%  Weight:      Height:        Intake/Output Summary (Last 24 hours) at 04/12/2024 1425 Last data filed at 04/11/2024 2000 Gross per 24 hour  Intake 120 ml  Output 300 ml  Net -180 ml   Wt Readings from Last 3 Encounters:  04/04/24 75.8 kg   01/11/24 75.8 kg  10/28/23 93.3 kg    Examination:  Constitutional: NAD   Data Reviewed: I have independently reviewed following labs and imaging studies   CBC Recent Labs  Lab 04/06/24 0413 04/07/24 0851 04/08/24 1007 04/09/24 0644 04/10/24 0605 04/12/24 0422  WBC 7.1 5.1 7.0 5.6 7.2 4.7  HGB 13.1 13.5 14.2 13.8 13.2 13.5  HCT 38.9* 40.0 43.6 42.9 40.2 41.8  PLT 261 256 267 250 234 218  MCV 93.5 93.7 96.5 97.3 96.2 97.2  MCH 31.5 31.6 31.4 31.3 31.6 31.4  MCHC 33.7 33.8 32.6 32.2 32.8 32.3  RDW 13.2 13.2 13.1 13.3 13.5 13.5  LYMPHSABS 1.3 1.3  --  1.9 1.1  --   MONOABS 0.7 0.6  --  0.5 0.7  --   EOSABS 0.1 0.1  --  0.2 0.3  --   BASOSABS 0.0 0.0  --  0.0 0.0  --     Recent Labs  Lab 04/06/24 0413 04/07/24 0851 04/08/24 1007 04/09/24 0644 04/10/24 0605 04/11/24 0503 04/12/24 0422  NA 130* 130* 132* 134* 133*  --  134*  K 4.9 4.2 4.6 4.5 4.5  --  4.7  CL 99 98 96* 103 100  --  100  CO2 26 25 25 22 22   --  23  GLUCOSE 85 98 113* 110* 113*  --  92  BUN 16 15 16 20 22   --  26*  CREATININE 1.23 1.26* 1.16 1.39* 1.23 1.49* 1.28*  CALCIUM  9.8 9.3 10.0 9.9 9.5  --  9.5  AST 17 15  --  16 13*  --   --   ALT 10 10  --  9 10  --   --   ALKPHOS 38 38  --  39 38  --   --   BILITOT 0.8 0.6  --  0.4 0.7  --   --   ALBUMIN 2.9* 2.9*  --  2.9* 2.7*  --   --   MG 1.9 1.8 1.9 1.8 1.6*  --  2.1    ------------------------------------------------------------------------------------------------------------------ No results for input(s): CHOL, HDL, LDLCALC, TRIG, CHOLHDL, LDLDIRECT in the last 72 hours.  Lab Results  Component Value Date   HGBA1C 5.7 (H) 04/04/2024   ------------------------------------------------------------------------------------------------------------------ No results for input(s):  TSH, T4TOTAL, T3FREE, THYROIDAB in the last 72 hours.  Invalid input(s): FREET3  Cardiac Enzymes No results for input(s): CKMB, TROPONINI,  MYOGLOBIN in the last 168 hours.  Invalid input(s): CK ------------------------------------------------------------------------------------------------------------------    Component Value Date/Time   BNP 56.6 04/05/2024 0455    CBG: Recent Labs  Lab 04/06/24 2118 04/07/24 0557 04/07/24 1138 04/07/24 1617 04/08/24 0800  GLUCAP 111* 90 140* 115* 111*    No results found for this or any previous visit (from the past 240 hours).   Radiology Studies: No results found.   Nilda Fendt, MD, PhD [facility]  Between 7 am - 7 pm I am available, please contact me via Amion (for emergencies) or Securechat (non urgent messages)  Between 7 pm - 7 am I am not available, please contact night coverage MD/APP via Amion

## 2024-04-12 NOTE — Progress Notes (Signed)
 Physical Therapy Treatment Patient Details Name: Todd [name]. MRN: [id number] DOB: 06-05-1956 Today's Date: 04/12/2024   History of Present Illness 68 y.o. male who presents to the ED 8/20 with testicular pain on the left. Found to have Hyponatremia. PMH: paroxysmal a flutter, HTN, CHFpEF, polysubstance use, who admits to drinking at least 1 beer daily and reports being on chronic pain meds daily    PT Comments  Pt resting in bed on arrival, pleasant and agreeable to session. Pt making steady progress towards acute goals, however continues to be limited in safe mobility by bil knee pain, limiting standing and weight bearing tolerance. Pt demonstrating bed mobility, transfers and short in room gait with RW for support with grossly CGA for safety with no overt LOB. Pt benefiting from cues for safety throughout session as pt with decreased safety awareness, standing from [facility] without RW. Pt up in chair at end of session and receptive to continued education on importance of frequent mobility. Pt continues to benefit from skilled PT services to progress toward functional mobility goals.      If plan is discharge home, recommend the following: A little help with walking and/or transfers;A little help with bathing/dressing/bathroom;Assistance with cooking/housework;Assist for transportation;Help with stairs or ramp for entrance   Can travel by private vehicle        Equipment Recommendations  Rollator (4 wheels)    Recommendations for Other Services       Precautions / Restrictions Precautions Precautions: Fall Recall of Precautions/Restrictions: Intact Restrictions Weight Bearing Restrictions Per Provider Order: No     Mobility  Bed Mobility Overal bed mobility: Needs Assistance Bed Mobility: Supine to Sit     Supine to sit: Min assist, Used rails, HOB elevated     General bed mobility comments: asking for hand hold to lift trunk upright, able to scoot out to EOB without  assist    Transfers Overall transfer level: Needs assistance Equipment used: Rolling walker (2 wheels) Transfers: Sit to/from Stand Sit to Stand: Contact guard assist           General transfer comment: pt standing from EOB at lowest height and BSC over low commode with CGA for safety    Ambulation/Gait Ambulation/Gait assistance: Contact guard assist Gait Distance (Feet): 15 Feet (x2) Assistive device: Rolling walker (2 wheels) Gait Pattern/deviations: Step-through pattern, Decreased stride length, Knee flexed in stance - right, Knee flexed in stance - left, Shuffle, Trunk flexed Gait velocity: dec     General Gait Details: short shuffling steps, no overt LOB, distance limited by knee pain   Stairs             Wheelchair Mobility     Tilt Bed    Modified Rankin (Stroke Patients Only)       Balance Overall balance assessment: Needs assistance Sitting-balance support: No upper extremity supported Sitting balance-Leahy Scale: Good     Standing balance support: Single extremity supported, Bilateral upper extremity supported, During functional activity, Reliant on assistive device for balance Standing balance-Leahy Scale: Poor Standing balance comment: reliant on UE support for dynamic tasks, able to maintain standing with unilateral support                            Communication Communication Communication: Impaired Factors Affecting Communication: Reduced clarity of speech  Cognition Arousal: Alert Behavior During Therapy: WFL for tasks assessed/performed   PT - Cognitive impairments: History of cognitive impairments, Memory  Following commands: Intact      Cueing Cueing Techniques: Verbal cues  Exercises      General Comments General comments (skin integrity, edema, etc.): VSS on RA      Pertinent Vitals/Pain Pain Assessment Pain Assessment: Faces Faces Pain Scale: Hurts little more Pain  Location: bil knees Pain Descriptors / Indicators: Grimacing, Sore, Aching Pain Intervention(s): Monitored during session, Limited activity within patient's tolerance, Repositioned    Home Living                          Prior Function            PT Goals (current goals can now be found in the care plan section) Acute Rehab PT Goals Patient Stated Goal: Walk normal again PT Goal Formulation: With patient Time For Goal Achievement: 04/19/24 Progress towards PT goals: Progressing toward goals    Frequency    Min 1X/week      PT Plan      Co-evaluation              AM-PAC PT 6 Clicks Mobility   Outcome Measure  Help needed turning from your back to your side while in a flat bed without using bedrails?: A Little Help needed moving from lying on your back to sitting on the side of a flat bed without using bedrails?: A Little Help needed moving to and from a bed to a chair (including a wheelchair)?: A Little Help needed standing up from a chair using your arms (e.g., wheelchair or bedside chair)?: A Little Help needed to walk in hospital room?: A Little Help needed climbing 3-5 steps with a railing? : Total 6 Click Score: 16    End of Session   Activity Tolerance: Patient tolerated treatment well Patient left: with call bell/phone within reach;in chair Nurse Communication: Mobility status PT Visit Diagnosis: Unsteadiness on feet (R26.81);Other abnormalities of gait and mobility (R26.89);Muscle weakness (generalized) (M62.81);Difficulty in walking, not elsewhere classified (R26.2);Pain Pain - Right/Left:  (both) Pain - part of body: Knee     Time: 8886-8868 PT Time Calculation (min) (ACUTE ONLY): 18 min  Charges:    $Gait Training: 8-22 mins PT General Charges $$ ACUTE PT VISIT: 1 Visit                     Chealsey Miyamoto R. PTA Acute Rehabilitation Services Office: [phone]   Therisa CHRISTELLA Boor 04/12/2024, 11:35 AM

## 2024-04-12 NOTE — Plan of Care (Signed)

## 2024-04-12 NOTE — TOC Progression Note (Addendum)
 Transition of Care (TOC) - Progression Note    Patient Details  Name: Todd [name]. MRN: [id number] Date of Birth: 15-Nov-1955  Transition of [facility] CM/SW Contact  Bridget Cordella Simmonds, LCSW Phone Number: 04/12/2024, 10:38 AM  Clinical Narrative:  CSW spoke with pt regarding SNF choice: he will accept offer at [facility].  CSW spoke with Angela/[place]: she will check on current status of bed availability.    1100: TC Angela: will have bed available tomorrow.  SNF auth request submitted in [place].   Expected Discharge Plan: Skilled Nursing Facility Barriers to Discharge: No Barriers Identified               Expected Discharge Plan and Services In-house Referral: Clinical Social Work Discharge Planning Services: CM Consult                                           Social Drivers of Health (SDOH) Interventions SDOH Screenings   Food Insecurity: Food Insecurity Present (04/07/2024)  Housing: High Risk (04/07/2024)  Transportation Needs: Unmet Transportation Needs (04/07/2024)  Utilities: Not At Risk (04/07/2024)  Alcohol Screen: Low Risk  (04/20/2023)  Financial Resource Strain: Low Risk  (10/27/2022)   Received from [facility]  Physical Activity: Inactive (10/27/2022)   Received from [facility]  Social Connections: Socially Isolated (04/07/2024)  Stress: Stress Concern Present (10/27/2022)   Received from [facility]  Tobacco Use: High Risk (04/08/2024)  Health Literacy: Low Risk  (10/27/2022)   Received from [facility]    Readmission Risk Interventions    10/11/2023    3:09 PM 09/02/2021    1:12 PM  Readmission Risk Prevention Plan  Transportation Screening Complete Complete  PCP or Specialist Appt within 3-5 Days Complete   HRI or Home Care Consult Complete Complete  Social Work Consult for Recovery Care Planning/Counseling Complete Complete  Palliative Care Screening Complete Not Applicable  Medication Review Oceanographer)  Complete

## 2024-04-13 DIAGNOSIS — E871 Hypo-osmolality and hyponatremia: Secondary | ICD-10-CM | POA: Diagnosis not present

## 2024-04-13 MED ORDER — ARIPIPRAZOLE 10 MG PO TABS: 10.0000 mg | ORAL_TABLET | Freq: Every day | ORAL | Status: AC

## 2024-04-13 MED ORDER — DICYCLOMINE HCL 10 MG PO CAPS: 10.0000 mg | ORAL_CAPSULE | Freq: Three times a day (TID) | ORAL | Status: AC

## 2024-04-13 MED ORDER — AMOXICILLIN-POT CLAVULANATE 875-125 MG PO TABS: 1.0000 | ORAL_TABLET | Freq: Two times a day (BID) | ORAL | Status: AC

## 2024-04-13 MED ORDER — FOLIC ACID 1 MG PO TABS
1.0000 mg | ORAL_TABLET | Freq: Every day | ORAL | Status: AC
Start: 2024-04-13 — End: ?

## 2024-04-13 MED ORDER — PANTOPRAZOLE SODIUM 40 MG PO TBEC: 40.0000 mg | DELAYED_RELEASE_TABLET | Freq: Two times a day (BID) | ORAL | Status: AC

## 2024-04-13 MED ORDER — SUCRALFATE 1 G PO TABS: 1.0000 g | ORAL_TABLET | Freq: Two times a day (BID) | ORAL | Status: AC

## 2024-04-13 MED ORDER — OXYCODONE HCL 10 MG PO TABS: 10.0000 mg | ORAL_TABLET | ORAL | 0 refills | Status: AC | PRN

## 2024-04-13 MED ORDER — GABAPENTIN 300 MG PO CAPS: 300.0000 mg | ORAL_CAPSULE | Freq: Three times a day (TID) | ORAL | Status: AC

## 2024-04-13 MED ORDER — DICLOFENAC SODIUM 1 % EX GEL: 2.0000 g | Freq: Four times a day (QID) | CUTANEOUS | Status: AC

## 2024-04-13 MED ORDER — METFORMIN HCL 500 MG PO TABS: 500.0000 mg | ORAL_TABLET | Freq: Two times a day (BID) | ORAL | Status: AC

## 2024-04-13 NOTE — Progress Notes (Signed)
 Pt with SDOH: food, housing, transportation needs. This pt has open case with adult protective services.  He was living in assisted living facility prior to admission but will not return to this facility.  He is being discharged to skilled nursing facility, initially for short term rehab but will remain in this facility for long term care.  Food, housing, transportation needs will be met at [facility]. Cathlyn Ferry, MSW, LCSW 8/29/20251:41 PM

## 2024-04-13 NOTE — Progress Notes (Signed)
 Report given to Harlene at [employer].

## 2024-04-13 NOTE — Discharge Summary (Signed)
 Physician Discharge Summary  [name]. FMW:[id number] DOB: March 09, 1956 DOA: 04/04/2024  PCP: Dwight Trula SQUIBB, MD  Admit date: 04/04/2024 Discharge date: 04/13/2024  Admitted From: home Disposition:  SNF  Recommendations for Outpatient Follow-up:  Follow up with PCP in 1-2 weeks  Home Health: none Equipment/Devices: none  Discharge Condition: stable CODE STATUS: Full code Diet Orders (From admission, onward)     Start     Ordered   04/08/24 0751  DIET DYS 3 Room service appropriate? Yes; Fluid consistency: Thin  Diet effective now       Question Answer Comment  Room service appropriate? Yes   Fluid consistency: Thin      04/08/24 0750            Brief Narrative / Interim history: 68 y.o. male with medical history significant of paroxysmal a flutter, HTN, CHFpEF, polysubstance use, who admits to drinking at least 1 beer daily and reports being on chronic pain meds daily who presents to the ED with testicular pain on the left.  He notes the pains been there approximately 2 months.  He also reports bilateral knee pain.  He has some epigastric pain and history of esophageal ulcers though he denies hematemesis dark stools.  In the ED he had a negative scrotal Doppler and they noted no significant knee swelling.  Labs were significant for hyponatremia with sodium of 121.  We were asked to admit for hyponatremia.  Per patient he has had very limited food over the last 3 days.  He states the place he is living is not feeding him. He's had extensive workup to this time.  Now s/p EGD for dysphagia which was notable for esophagitis/gastritis/duodenitis. He's continuing to complain of pain which has not been clearly explained by the workup to this point.  Has chronic knee pain and is also c/o abdominal pain.    Hospital Course / Discharge diagnoses: Principal Problem:   Hyponatremia Active Problems:   Non-insulin  dependent type 2 diabetes mellitus (HCC)   Affective bipolar disorder  (HCC)   Primary hypertension   Alcohol abuse   Ulcerative esophagitis   Atrial flutter (HCC)   Acute bilateral knee pain   History of prostate cancer   Testicular pain, left   Principal problem Multifocal Pneumonia -patient was found to have cough, was found to have multifocal pneumonia.  He has been treated with augmentin , improving, afebrile, no leukocytosis, and he is on room air.  Continue Augmentin  for 3 additional days   Active problems Dysphagia, Esophagitis  Gastritis  Duodenitis - SLP eval - dysphagia 3, thin - s/p MBS - mild pharyngeal dysphagia including unilateral outpouching - delay in sawllow initiation, spilling liquids into pyriform sinuses where they sit until he triggers a swallow - altered timing contributes to trace amounts of aspiration and penetration with thin liquids as well as frank penetration with nectar thick liquids (see 8/25 SLP note). Esophagram with mild tapered stricture in distal third of the esophagus, tertiary contractions in the distal third of the esophagus, mild hiatal hernia prior to GE junction.  GI consulted, underwent an EGD which showed grade D erosive esophagitis, chronic gastritis, duodenitis, biopsies taken.  PPI BID for 4 weeks PPI BID at discharge, followed by protonix  40 mg daily.  Sucralfate  BID x 2 weeks.   Abdominal Pain -CT 8/20 with possible ileus vs early low grade SBO, however on repeat CT scan there was no acute intra-abdominal or pelvic pathologies.  Lipase was normal, it is unclear  what is causing his pain, possibly related to his esophagitis/gastritis as above.  Has been placed on Bentyl , continue Hyponatremia - Suspect multifactorial.  History suggest poor PO intake and suspected hypovolemia.  Currently appears euvolemic.  Also, is on psychiatric meds that could cause SIADH (zoloft , trazodone  - he was taking trazodone  inappropriately). TSH wnl.  Nondiagnostic cortisol.  ACTH  stim wnl.  Sodium improving and stable prior to  discharge Acute bilateral knee pain, chronic Pain - Add gabapentin  to regimen - increase as tolerated.  Scheduled APAP.  Voltaren . Continue oxycodone  (on chronic norco at home). Plain films with mild tricompartmental OA, ossified intraarticular bodies in the suprapatellar space, patella baja (R knee).  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   Left Testicular and Left Leg Pain - Describes on and off pain for a year, no pain in back, but occasionally describes numbness to buttocks or scrotum.   CT abd/pelvis without explanation. Scrotal US  without acute abnormality - small R and trace L hydroceles. MRI L spine without acute findings or explanation for symptoms - mild disc bulging.  No spinal stenosis or nerve root encroachment.  Asymmetric R sided facet hypertrophy at L5-S1.  Affective bipolar disorder [facility]) -psychiatry consulted, medication changes below.  He is stable on current regimen Alcohol abuse, Substance Abuse - CIWA protocol. Patient denies any history of DTs or seizures with not drinking. His wife (separated), Candace [phone]) notes chronic issues with substance abuse/opiates HFpEF -appears euvolemic Paroxysmal atrial flutter (HCC) -currently in sinus, not on anticoagulation Ulcerative esophagitis - PPI History of prostate cancer - Status post prostatectomy Primary hypertension -blood pressure has been stable off diltiazem , hold upon discharge, continue to monitor, reintroduce if needed Hx Folate Def - Folic acid  Non-insulin  dependent type 2 diabetes mellitus (HCC) - Currently on no meds Carb modified diet.  A1C 5.7, started on metformin   Sepsis ruled out   Discharge Instructions   Allergies as of 04/13/2024   No Known Allergies      Medication List     STOP taking these medications    diltiazem  180 MG 24 hr capsule Commonly known as: CARDIZEM  CD   DULoxetine  20 MG capsule Commonly known  as: CYMBALTA    HYDROcodone -acetaminophen  10-325 MG tablet Commonly known as: NORCO   trazodone  300 MG tablet Commonly known as: DESYREL        TAKE these medications    ACETAMINOPHEN  PO Take 3-4 tablets by mouth every 6 (six) hours as needed for fever, headache or moderate pain (pain score 4-6).   allopurinol 100 MG tablet Commonly known as: ZYLOPRIM Take 200 mg by mouth daily.   amoxicillin -clavulanate 875-125 MG tablet Commonly known as: AUGMENTIN  Take 1 tablet by mouth every 12 (twelve) hours for 3 days.   ARIPiprazole  10 MG tablet Commonly known as: ABILIFY  Take 1 tablet (10 mg total) by mouth daily.   diclofenac  Sodium 1 % Gel Commonly known as: VOLTAREN  Apply 2 g topically 4 (four) times daily.   dicyclomine  10 MG capsule Commonly known as: BENTYL  Take 1 capsule (10 mg total) by mouth 4 (four) times daily -  before meals and at bedtime.   folic acid  1 MG tablet Commonly known as: FOLVITE  Take 1 tablet (1 mg total) by mouth daily.   gabapentin  300 MG capsule Commonly known as: NEURONTIN  Take 1 capsule (300 mg total) by mouth 3 (three) times daily.   hydrOXYzine  25 MG tablet Commonly known as: ATARAX  Take 1 tablet (25 mg  total) by mouth 3 (three) times daily as needed for anxiety.   melatonin 3 MG Tabs tablet Take 3 mg by mouth at bedtime.   metFORMIN  500 MG tablet Commonly known as: GLUCOPHAGE  Take 1 tablet (500 mg total) by mouth 2 (two) times daily with a meal.   Oxycodone  HCl 10 MG Tabs Take 1 tablet (10 mg total) by mouth every 4 (four) hours as needed for severe pain (pain score 7-10).   pantoprazole  40 MG tablet Commonly known as: PROTONIX  Take 1 tablet (40 mg total) by mouth 2 (two) times daily.   sertraline  100 MG tablet Commonly known as: ZOLOFT  Take 200 mg by mouth daily.   sucralfate  1 g tablet Commonly known as: CARAFATE  Take 1 tablet (1 g total) by mouth 2 (two) times daily for 10 days.   vitamin B-12 500 MCG tablet Commonly  known as: CYANOCOBALAMIN  Take 500-1,000 mcg by mouth daily.   Vitamin D (Ergocalciferol) 1.25 MG (50000 UNIT) Caps capsule Commonly known as: DRISDOL Take 50,000 Units by mouth every 7 (seven) days.        Contact information for follow-up providers     Dwight Trula SQUIBB, MD Follow up.   Specialty: Internal Medicine Contact information: [address] [place] [address] [phone]              Contact information for after-discharge care     Destination     [facility] .   Service: Skilled Nursing Contact information: [address] [place]  [phone]                     Consultations: Psychiatry  GI  Procedures/Studies:  CT Angio Chest Pulmonary Embolism (PE) W or WO Contrast Result Date: 04/09/2024 CLINICAL DATA:  Intractable chest pain. EXAM: CT ANGIOGRAPHY CHEST WITH CONTRAST TECHNIQUE: Multidetector CT imaging of the chest was performed using the standard protocol during bolus administration of intravenous contrast. Multiplanar CT image reconstructions and MIPs were obtained to evaluate the vascular anatomy. RADIATION DOSE REDUCTION: This exam was performed according to the departmental dose-optimization program which includes automated exposure control, adjustment of the mA and/or kV according to patient size and/or use of iterative reconstruction technique. CONTRAST:  75mL OMNIPAQUE  IOHEXOL  350 MG/ML SOLN COMPARISON:  Chest radiograph 04/05/2024. Cardiac CT 10/13/2023. CT chest 08/30/2021 FINDINGS: Cardiovascular: Technically adequate study with good opacification of the central and segmental pulmonary arteries. Moderate motion artifact. No focal filling defects are identified. No evidence of significant pulmonary embolus. Normal heart size. No pericardial effusions. Normal caliber thoracic aorta. No aortic dissection. Minimal aortic calcification. Great vessel origins are patent.  Mediastinum/Nodes: Mediastinal lymph nodes are not pathologically enlarged, likely reactive. Esophagus is decompressed. Thyroid  gland is unremarkable. Lungs/Pleura: Areas of parenchymal scarring and fibrosis demonstrated bilaterally with subpleural prominence. This is relatively unchanged since prior studies. There is evidence of developing central patchy airspace changes which may represent superimposed pneumonia, active alveolitis, or aspiration. No pleural effusion or pneumothorax. Upper Abdomen: No acute abnormalities. Musculoskeletal: Degenerative changes in the spine. No acute bony abnormalities. Old rib fracture deformities. Old midthoracic vertebral compression deformities, unchanged since prior study. Review of the MIP images confirms the above findings. IMPRESSION: 1. No evidence of significant pulmonary embolus. 2. 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 3. Superimposed patchy nodular infiltrates in the perihilar regions likely representing multifocal pneumonia, active alveolitis, or aspiration. Electronically Signed   By: Elsie Mannie HERO.D.  On: 04/09/2024 20:38   DG Swallowing Func-Speech Pathology Result Date: 04/09/2024 Table formatting from the original result was not included. Modified Barium Swallow Study Patient Details Name: Lemond Griffee. MRN: [id number] Date of Birth: 27-Aug-1955 Today's Date: 04/09/2024 HPI/PMH: HPI: Treyven Lafauci. is a 68 y.o. male who presented to the ED with testicular pain on the left, bilateral knee pain, and epigastric pain. SLP consulted for pt c/o dysphagia. 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 EGD 8/24 showed severe esophagitis, lower esophageal stricture (nonobstructive), and gastritis. Previous swallowing testing includes esophagram in February 2025  with stasis of tablet as well as SLP eval 10/11/23 with recs for D3/thin. Pt with with medical history significant for paroxysmal a flutter, HTN, CHFpEF, polysubstance use, who admits to drinking at least 1 beer daily and reports being on chronic pain meds daily. Clinical Impression: Pt has a mild pharyngeal dysphagia including what appears to be a unilateral outpouching. In A/P view, this appears to be on his L side, and it is where residue remains post-swallow, even with liquids. Pt also has a delay in swallow initiation, spilling liquids into his pyriform sinuses where they briefly sit until he triggers a swallow. This altered timing contributes to trace amounts of aspiration (PAS 8) and penetration with thin liquids, as well as frank penetration (PAS 3) with nectar thick liquids. A cued cough helps to clear aspirates, and cues for single sips help to improve airway protection further (PAS 2). Pt denies any h/o PNA, and the above findings are not likely to be contributing to his presenting symptoms. He has c/o abdominal pain after each sip, also pointing toward his sternum at times. Recommend that he continue with current diet with use of esophageal precautions as well as small, single sips. Factors that may increase risk of adverse event in presence of aspiration [name] 2021): No data recorded Recommendations/Plan: Swallowing Evaluation Recommendations Swallowing Evaluation Recommendations Recommendations: PO diet PO Diet Recommendation: Dysphagia 3 (Mechanical soft); Thin liquids (Level 0) Liquid Administration via: Cup; Straw Medication Administration: Crushed with puree Supervision: Patient able to self-feed; Intermittent supervision/cueing for swallowing strategies Swallowing strategies  : Slow rate; Small bites/sips; Follow solids with liquids; Clear throat intermittently Postural changes: Position pt fully upright for meals; Stay upright 30-60 min after meals Oral care recommendations: Oral care  BID (2x/day) Recommended consults: Consider GI consultation Treatment Plan Treatment Plan Treatment recommendations: Therapy as outlined in treatment plan below Follow-up recommendations: No SLP follow up Functional status assessment: Patient has had a recent decline in their functional status and demonstrates the ability to make significant improvements in function in a reasonable and predictable amount of time. Treatment frequency: Min 2x/week Treatment duration: 1 week Interventions: Aspiration precaution training; Patient/family education; Compensatory techniques; Diet toleration management by SLP Recommendations Recommendations for follow up therapy are one component of a multi-disciplinary discharge planning process, led by the attending physician.  Recommendations may be updated based on patient status, additional functional criteria and insurance authorization. Assessment: Orofacial Exam: Orofacial Exam Oral Cavity - Dentition: Missing dentition; Dentures, not available Anatomy: Anatomy: Suspected cervical osteophytes; Other (Comment) (outpouching on L side) Boluses Administered: Boluses Administered Boluses Administered: Thin liquids (Level 0); Mildly thick liquids (Level 2, nectar thick); Puree; Solid  Oral Impairment Domain: Oral Impairment Domain Lip Closure: No labial escape Tongue control during bolus hold: Cohesive bolus  between tongue to palatal seal Bolus preparation/mastication: Timely and efficient chewing and mashing Bolus transport/lingual motion: Brisk tongue motion Oral residue: Trace residue lining oral structures Location of oral residue : Tongue Initiation of pharyngeal swallow : Pyriform sinuses  Pharyngeal Impairment Domain: Pharyngeal Impairment Domain Soft palate elevation: No bolus between soft palate (SP)/pharyngeal wall (PW) Laryngeal elevation: Complete superior movement of thyroid  cartilage with complete approximation of arytenoids to epiglottic petiole Anterior hyoid excursion:  Complete anterior movement Epiglottic movement: Complete inversion Laryngeal vestibule closure: Incomplete, narrow column air/contrast in laryngeal vestibule Pharyngeal stripping wave : Present - complete Pharyngeal contraction (A/P view only): Complete Pharyngoesophageal segment opening: Complete distension and complete duration, no obstruction of flow Tongue base retraction: No contrast between tongue base and posterior pharyngeal wall (PPW) Pharyngeal residue: -- (residue in outpouching)  Esophageal Impairment Domain: Esophageal Impairment Domain Esophageal clearance upright position: Complete clearance, esophageal coating Pill: No data recorded Penetration/Aspiration Scale Score: Penetration/Aspiration Scale Score 1.  Material does not enter airway: Puree; Solid 3.  Material enters airway, remains ABOVE vocal cords and not ejected out: Mildly thick liquids (Level 2, nectar thick) 8.  Material enters airway, passes BELOW cords without attempt by patient to eject out (silent aspiration) : Thin liquids (Level 0) Compensatory Strategies: No data recorded  General Information: Caregiver present: No  Diet Prior to this Study: Dysphagia 3 (mechanical soft); Thin liquids (Level 0)   Temperature : Normal   Respiratory Status: WFL   Supplemental O2: None (Room air)   History of Recent Intubation: No  Behavior/Cognition: Alert; Cooperative; Pleasant mood Self-Feeding Abilities: Able to self-feed Baseline vocal quality/speech: Normal Volitional Cough: Able to elicit Volitional Swallow: Able to elicit Exam Limitations: No limitations Goal Planning: Prognosis for improved oropharyngeal function: Good No data recorded No data recorded Patient/Family Stated Goal: improve comfort with po intake Consulted and agree with results and recommendations: Patient Pain: Pain Assessment Pain Assessment: Faces Faces Pain Scale: 6 Pain Location: abdomen after eating Pain Descriptors / Indicators: Grimacing; Sore Pain Intervention(s):  Limited activity within patient's tolerance; Monitored during session End of Session: Start Time:SLP Start Time (ACUTE ONLY): 1259 Stop Time: SLP Stop Time (ACUTE ONLY): 1320 Time Calculation:SLP Time Calculation (min) (ACUTE ONLY): 21 min Charges: SLP Evaluations $ SLP Speech Visit: 1 Visit SLP Evaluations $MBS Swallow: 1 Procedure $Swallowing Treatment: 1 Procedure SLP visit diagnosis: SLP Visit Diagnosis: Dysphagia, pharyngoesophageal phase (R13.14) Past Medical History: Past Medical History: Diagnosis Date  Arthritis   fingers (07/14/2018)  Depression   DVT (deep venous thrombosis) (HCC) LLE  Hepatitis C    finished harvoni tx ~ 2017  Hypercholesterolemia   Hypertension   Prostate cancer (HCC) 73yrs ago  Pulmonary embolism and infarction (HCC) 07/13/2018  Sleep apnea   not currently using cpap, mask causing vertigo  Type II diabetes mellitus (HCC)  Past Surgical History: Past Surgical History: Procedure Laterality Date  BIOPSY  01/12/2019  Procedure: BIOPSY;  Surgeon: Donnald Charleston, MD;  Location: [facility] ENDOSCOPY;  Service: Endoscopy;;  BIOPSY  06/23/2019  Procedure: BIOPSY;  Surgeon: Burnette Fallow, MD;  Location: [facility] ENDOSCOPY;  Service: Endoscopy;;  COLONOSCOPY WITH PROPOFOL  N/A 02/19/2014  Procedure: COLONOSCOPY WITH PROPOFOL ;  Surgeon: Gladis MARLA Louder, MD;  Location: [facility] ENDOSCOPY;  Service: Endoscopy;  Laterality: N/A;  ENDOVENOUS ABLATION SAPHENOUS VEIN W/ LASER Left 11/22/2017  endovenous laser ablation L SSV by Lynwood Collum MD   ESOPHAGEAL BRUSHING  06/23/2019  Procedure: ESOPHAGEAL BRUSHING;  Surgeon: Burnette Fallow, MD;  Location[facility] ENDOSCOPY;  Service: Endoscopy;;  ESOPHAGOGASTRODUODENOSCOPY  N/A 04/08/2024  Procedure: EGD (ESOPHAGOGASTRODUODENOSCOPY);  Surgeon: Elicia Claw, MD;  Location: [facility] ENDOSCOPY;  Service: Gastroenterology;  Laterality: N/A;  ESOPHAGOGASTRODUODENOSCOPY (EGD) WITH PROPOFOL  N/A 01/12/2019  Procedure: ESOPHAGOGASTRODUODENOSCOPY (EGD) WITH PROPOFOL ;  Surgeon: Donnald Charleston, MD;   Location: [facility] ENDOSCOPY;  Service: Endoscopy;  Laterality: N/A;  Patient is also scheduled for barium swallow; please notify radiology after patient's EGD is complete so that barium swallow follows the endoscopy, not vice versa  ESOPHAGOGASTRODUODENOSCOPY (EGD) WITH PROPOFOL  N/A 06/23/2019  Procedure: ESOPHAGOGASTRODUODENOSCOPY (EGD) WITH PROPOFOL ;  Surgeon: Burnette Fallow, MD;  Location: [facility] ENDOSCOPY;  Service: Endoscopy;  Laterality: N/A;  PROSTATECTOMY  2008  REPAIR QUADRICEPS / HAMSTRING MUSCLE Right  Leita SAILOR., M.A. CCC-SLP Acute Rehabilitation Services Office: [phone] Secure chat preferred 04/09/2024, 2:49 PM  CT ABDOMEN PELVIS W CONTRAST Result Date: 04/08/2024 CLINICAL DATA:  Abdominal pain. EXAM: CT ABDOMEN AND PELVIS WITH CONTRAST TECHNIQUE: Multidetector CT imaging of the abdomen and pelvis was performed using the standard protocol following bolus administration of intravenous contrast. RADIATION DOSE REDUCTION: This exam was performed according to the departmental dose-optimization program which includes automated exposure control, adjustment of the mA and/or kV according to patient size and/or use of iterative reconstruction technique. CONTRAST:  75mL OMNIPAQUE  IOHEXOL  350 MG/ML SOLN COMPARISON:  CT abdomen pelvis dated 04/04/2024. FINDINGS: Lower chest: Mild diffuse interstitial coarsening. No intra-abdominal free air or free fluid. Hepatobiliary: The liver is unremarkable. No biliary ductal dilatation. Cholecystectomy. Pancreas: Coarse calcification of the uncinate process of the pancreas sequela of chronic pancreatitis. No dilatation of the main pancreatic duct or gland atrophy. Spleen: Normal in size without focal abnormality. Adrenals/Urinary Tract: The left adrenal glands unremarkable. There is a 2 cm indeterminate right adrenal nodule. Moderate bilateral renal parenchyma atrophy. Small bilateral renal cysts suboptimally evaluated on this CT. There is no hydronephrosis on either side. The  visualized ureters and urinary bladder appear unremarkable. Stomach/Bowel: Dense oral contrast with associated streak artifact noted throughout the colon. There is diffuse colonic diverticulosis. There is no bowel obstruction or active inflammation. The appendix is normal. Vascular/Lymphatic: The abdominal aorta and IVC are unremarkable. No portal venous gas. There is no adenopathy. Reproductive: Prostatectomy. Other: Anterior pelvic wall hernia repair mesh. Musculoskeletal: Osteopenia with degenerative changes. No acute osseous pathology. IMPRESSION: 1. No acute intra-abdominal or pelvic pathology. 2. Colonic diverticulosis. No bowel obstruction. Normal appendix. Electronically Signed   By: Vanetta Chou M.D.   On: 04/08/2024 18:37   DG Abd 1 View Result Date: 04/07/2024 CLINICAL DATA:  Abdominal distension. EXAM: ABDOMEN - 1 VIEW COMPARISON:  CT 04/14/2024 FINDINGS: There is barium throughout the colon from yesterday's esophagram. Scattered air throughout prominent but not abnormally dilated in the central abdomen. No visible radiopaque calculi. Tacks in the lower abdomen from prior hernia repair. IMPRESSION: 1. Barium throughout the colon from yesterday's esophagram. 2. Scattered air throughout prominent but not abnormally dilated small bowel in the central abdomen, may represent ileus. Electronically Signed   By: Andrea Gasman M.D.   On: 04/07/2024 14:25   DG ESOPHAGUS W SINGLE CM (SOL OR THIN BA) Result Date: 04/06/2024 CLINICAL DATA:  68 year old male with globus sensation for diagnostic esophagram. EXAM: ESOPHAGUS/BARIUM SWALLOW/TABLET STUDY TECHNIQUE: Single contrast examination was performed using thin liquid barium. This exam was performed by Abigail C. Augusta, PA-C, and was supervised and interpreted by Dr. Toribio Faes. FLUOROSCOPY: Radiation Exposure Index (as provided by the fluoroscopic device): 71.5 mGy Kerma COMPARISON:  None Available. FINDINGS: Swallowing: Appears normal. No  vestibular penetration or aspiration seen.  Pharynx: Unremarkable. Esophagus: Mild tapered stricture in distal third of the esophagus just above gastroesophageal junction. Esophageal motility: Tertiary contractions visualized in distal third of the esophagus. Hiatal Hernia: Mild hiatal hernia visualized just prior to gastroesophageal junction. Gastroesophageal reflux: None visualized. Ingested 13mm barium tablet: Became stuck in the distal third of the esophagus, subsequent sips of water and thin barium were given without passage of tablet. Patient was informed tablet will dissolve. Other: Exam limited due to patient's inability to stand. IMPRESSION: Mild tapered stricture in distal third of the esophagus, tertiary contractions visualized in distal third of the esophagus, mild hiatal hernia visualized just prior to gastroesophageal junction. Performed By Lavanda Jurist, PA-C Electronically Signed   By: JONETTA Faes M.D.   On: 04/06/2024 14:26   DG Knee 1-2 Views Right Result Date: 04/06/2024 CLINICAL DATA:  Bilateral knee pain. EXAM: RIGHT KNEE - 1-2 VIEW COMPARISON:  12/22/2023 FINDINGS: Again seen patellar [place]. The joint spaces are normal. No fracture. Probable ossified intra-articular bodies in the suprapatellar space, chronic. No significant joint effusion. No erosive or bony destructive change. Chondrocalcinosis. Mild tricompartmental peripheral spurring. IMPRESSION: 1. Mild tricompartmental osteoarthritis with chondrocalcinosis. 2. Probable ossified intra-articular bodies in the suprapatellar space, chronic. 3. Patella baja. Electronically Signed   By: Andrea Gasman M.D.   On: 04/06/2024 14:02   DG Knee 1-2 Views Left Result Date: 04/06/2024 CLINICAL DATA:  Bilateral knee pain. EXAM: LEFT KNEE - 1-2 VIEW COMPARISON:  01/11/2024 FINDINGS: Lateral tibiofemoral joint space narrowing. Mild to moderate tricompartmental peripheral spurring. Fragmented quadriceps enthesophyte versus ossified intra-articular  body. Faint chondrocalcinosis. No fracture, erosion, or focal bone abnormality. Diminished joint effusion. Unremarkable soft tissues. IMPRESSION: 1. Mild to moderate tricompartmental osteoarthritis, most prominent in the lateral tibiofemoral compartment. 2. Fragmented quadriceps enthesophyte versus ossified intra-articular body. Electronically Signed   By: Andrea Gasman M.D.   On: 04/06/2024 14:01   MR LUMBAR SPINE WO CONTRAST Result Date: 04/05/2024 CLINICAL DATA:  Lumbar radiculopathy, symptoms persist with > 6 wks treatment Patient reports chronic pain medications with left testicular pain for approximately 2 months. EXAM: MRI LUMBAR SPINE WITHOUT CONTRAST TECHNIQUE: Multiplanar, multisequence MR imaging of the lumbar spine was performed. No intravenous contrast was administered. COMPARISON:  Abdominopelvic CT 04/04/2024. Chest CT 09/08/2018. Lumbar spine radiographs 12/03/2009. FINDINGS: Segmentation: Transitional lumbosacral anatomy. As correlated with prior CTs, there are 12 rib-bearing thoracic type vertebral bodies, 4 lumbar type vertebral bodies and a largely sacralized transitional L5 segment. Alignment:  Physiologic. Vertebrae: No worrisome osseous lesion, acute fracture or pars defect. Prominent hemangioma within the L3 vertebral body. Partially ankylosed paraspinal osteophytes in the lower thoracic spine. Mild sacroiliac degenerative changes bilaterally. Conus medullaris: Extends to the T12-L1 level. The conus and cauda equina appear normal. Paraspinal and other soft tissues: No significant paraspinal findings. Simple appearing bilateral renal cysts for which no specific follow-up imaging is recommended. Stable 1.9 cm right adrenal nodule reported as a probable adenoma on prior CT, stable from 2020 chest CT. Disc levels: Sagittal images demonstrate paraspinal osteophytes in the lower thoracic spine but no evidence of spinal stenosis or significant foraminal narrowing. L1-2: Normal interspace.  L2-3: Mild disc desiccation and bulging with preserved disc height. Mild facet hypertrophy. No spinal stenosis or foraminal narrowing. L3-4: Mild disc desiccation and bulging with preserved disc height. Mild to moderate facet and ligamentous hypertrophy. No spinal stenosis or foraminal narrowing. L4-5: Preserved disc height with mild disc bulging and moderate to severe asymmetric right facet hypertrophy. No spinal stenosis or foraminal narrowing. L5-S1:  As numbered, this is a vestigial disc space without acquired abnormality. IMPRESSION: 1. Transitional lumbosacral anatomy. There are 4 lumbar type vertebral bodies and a largely sacralized transitional L5 segment. 2. No acute findings or explanation for the patient's symptoms. 3. Mild disc bulging as described. No spinal stenosis or nerve root encroachment. Asymmetric right-sided facet hypertrophy at L5-S1. 4. Stable right adrenal nodule, reported as a probable adenoma on prior CT. Electronically Signed   By: Elsie Perone M.D.   On: 04/05/2024 14:52   DG CHEST PORT 1 VIEW Result Date: 04/05/2024 CLINICAL DATA:  CHF. EXAM: PORTABLE CHEST 1 VIEW COMPARISON:  10/10/2023 FINDINGS: Cardiopericardial silhouette is at upper limits of normal for size. Vascular congestion with diffuse interstitial opacity suggesting edema. No focal airspace consolidation. No substantial pleural effusion. No acute bony abnormality. Telemetry leads overlie the chest. IMPRESSION: Vascular congestion with diffuse interstitial opacity suggesting edema. Electronically Signed   By: Camellia Candle M.D.   On: 04/05/2024 07:49   CT ABDOMEN PELVIS W CONTRAST Result Date: 04/04/2024 CLINICAL DATA:  Abdominal pain EXAM: CT ABDOMEN AND PELVIS WITH CONTRAST TECHNIQUE: Multidetector CT imaging of the abdomen and pelvis was performed using the standard protocol following bolus administration of intravenous contrast. RADIATION DOSE REDUCTION: This exam was performed according to the departmental  dose-optimization program which includes automated exposure control, adjustment of the mA and/or kV according to patient size and/or use of iterative reconstruction technique. CONTRAST:  75mL OMNIPAQUE  IOHEXOL  350 MG/ML SOLN COMPARISON:  08/30/2021 FINDINGS: Lower chest: Scarring in the lung bases.  No acute findings. Hepatobiliary: No focal liver abnormality is seen. Status post cholecystectomy. No biliary dilatation. Pancreas: No focal abnormality or ductal dilatation. Spleen: No focal abnormality.  Normal size. Adrenals/Urinary Tract: Right adrenal nodule is stable most compatible with adenoma. Numerous bilateral renal cysts are stable. No follow-up imaging recommended. No stones or hydronephrosis. Urinary bladder unremarkable. Stomach/Bowel: Normal appendix. Stomach, large and small bowel grossly unremarkable. Mildly prominent mid and lower small bowel loops. Distal small bowel is decompressed. Proximal small bowel is normal caliber. Sigmoid diverticulosis. No active diverticulitis. Vascular/Lymphatic: No evidence of aneurysm or adenopathy. Reproductive: No visible focal abnormality. Other: No free fluid or free air. Musculoskeletal: No acute bony abnormality. IMPRESSION: Mildly prominent mid abdominal small bowel loops in the mid to lower abdomen. This could reflect focal ileus or early low grade small bowel obstruction. Bibasilar scarring. Sigmoid diverticulosis. Electronically Signed   By: Franky Crease M.D.   On: 04/04/2024 20:54   US  SCROTUM W/DOPPLER Result Date: 04/04/2024 CLINICAL DATA:  [id number] Scrotal pain [id number] EXAM: SCROTAL ULTRASOUND DOPPLER ULTRASOUND OF THE TESTICLES TECHNIQUE: Complete ultrasound examination of the testicles, epididymis, and other scrotal structures was performed. Color and spectral Doppler ultrasound were also utilized to evaluate blood flow to the testicles. COMPARISON:  Dec 24, 2009 FINDINGS: Right testicle Measurements: 2.7 x 2 x 2.5 cm. No mass or microlithiasis  visualized. Striated heterogeneity of the testicular parenchyma, which may represent age-related seminiferous tubular atrophy. Left testicle Measurements: 2.9 x 2 x 2.4 cm. No mass or microlithiasis visualized. Striated heterogeneity of the testicular parenchyma, which may represent age-related seminiferous tubular atrophy. Curvilinear calcification with shadowing along the posterior testicle, possibly a scrotolith. Right epididymis:  Normal in size and appearance. Left epididymis:  Normal in size and appearance. Hydrocele:  Small volume right hydrocele.  Trace left hydrocele. Varicocele:  None visualized. Pulsed Doppler interrogation of both testes demonstrates normal low resistance arterial and venous waveforms bilaterally. IMPRESSION: 1. No acute sonographic abnormality  within the testicles; more specifically, no testicular mass, findings of epididymo-orchitis, or changes of testicular torsion, at this time. 2. Small right and trace left-sided hydroceles. Electronically Signed   By: Rogelia Myers M.D.   On: 04/04/2024 16:14     Subjective: - no chest pain, shortness of breath, no abdominal pain, nausea or vomiting.   Discharge Exam: BP 109/73 (BP Location: Left Arm)   Pulse (!) 57   Temp 98.3 F (36.8 C) (Oral)   Resp 15   Ht 5' 8 (1.727 m)   Wt 75.8 kg   SpO2 93%   BMI 25.41 kg/m   General: Pt is alert, awake, not in acute distress Cardiovascular: RRR, S1/S2 +, no rubs, no gallops Respiratory: CTA bilaterally, no wheezing, no rhonchi Abdominal: Soft, NT, ND, bowel sounds + Extremities: no edema, no cyanosis  The results of significant diagnostics from this hospitalization (including imaging, microbiology, ancillary and laboratory) are listed below for reference.     Microbiology: No results found for this or any previous visit (from the past 240 hours).   Labs: Basic Metabolic Panel: Recent Labs  Lab 04/07/24 0851 04/08/24 1007 04/09/24 0644 04/10/24 0605 04/11/24 0503  04/12/24 0422  NA 130* 132* 134* 133*  --  134*  K 4.2 4.6 4.5 4.5  --  4.7  CL 98 96* 103 100  --  100  CO2 25 25 22 22   --  23  GLUCOSE 98 113* 110* 113*  --  92  BUN 15 16 20 22   --  26*  CREATININE 1.26* 1.16 1.39* 1.23 1.49* 1.28*  CALCIUM  9.3 10.0 9.9 9.5  --  9.5  MG 1.8 1.9 1.8 1.6*  --  2.1  PHOS 3.7 3.9 4.5 3.8  --   --    Liver Function Tests: Recent Labs  Lab 04/07/24 0851 04/09/24 0644 04/10/24 0605  AST 15 16 13*  ALT 10 9 10   ALKPHOS 38 39 38  BILITOT 0.6 0.4 0.7  PROT 7.0 7.4 6.7  ALBUMIN 2.9* 2.9* 2.7*   CBC: Recent Labs  Lab 04/07/24 0851 04/08/24 1007 04/09/24 0644 04/10/24 0605 04/12/24 0422  WBC 5.1 7.0 5.6 7.2 4.7  NEUTROABS 3.1  --  2.9 5.0  --   HGB 13.5 14.2 13.8 13.2 13.5  HCT 40.0 43.6 42.9 40.2 41.8  MCV 93.7 96.5 97.3 96.2 97.2  PLT 256 267 250 234 218   CBG: Recent Labs  Lab 04/06/24 2118 04/07/24 0557 04/07/24 1138 04/07/24 1617 04/08/24 0800  GLUCAP 111* 90 140* 115* 111*   Hgb A1c No results for input(s): HGBA1C in the last 72 hours. Lipid Profile No results for input(s): CHOL, HDL, LDLCALC, TRIG, CHOLHDL, LDLDIRECT in the last 72 hours. Thyroid  function studies No results for input(s): TSH, T4TOTAL, T3FREE, THYROIDAB in the last 72 hours.  Invalid input(s): FREET3 Urinalysis    Component Value Date/Time   COLORURINE YELLOW 04/05/2024 0200   APPEARANCEUR HAZY (A) 04/05/2024 0200   LABSPEC 1.020 04/05/2024 0200   PHURINE 6.0 04/05/2024 0200   GLUCOSEU NEGATIVE 04/05/2024 0200   HGBUR SMALL (A) 04/05/2024 0200   BILIRUBINUR NEGATIVE 04/05/2024 0200   KETONESUR NEGATIVE 04/05/2024 0200   PROTEINUR NEGATIVE 04/05/2024 0200   UROBILINOGEN 0.2 12/24/2009 1944   NITRITE NEGATIVE 04/05/2024 0200   LEUKOCYTESUR LARGE (A) 04/05/2024 0200    FURTHER DISCHARGE INSTRUCTIONS:   Get Medicines reviewed and adjusted: Please take all your medications with you for your next visit with your Primary  MD  Laboratory/radiological data: Please request your Primary MD to go over all hospital tests and procedure/radiological results at the follow up, please ask your Primary MD to get all Hospital records sent to his/her office.   In some cases, they will be blood work, cultures and biopsy results pending at the time of your discharge. Please request that your primary care M.D. goes through all the records of your hospital data and follows up on these results.   Also Note the following: If you experience worsening of your admission symptoms, develop shortness of breath, life threatening emergency, suicidal or homicidal thoughts you must seek medical attention immediately by calling 911 or calling your MD immediately  if symptoms less severe.   You must read complete instructions/literature along with all the possible adverse reactions/side effects for all the Medicines you take and that have been prescribed to you. Take any new Medicines after you have completely understood and accpet all the possible adverse reactions/side effects.    Do not drive when taking Pain medications or sleeping medications (Benzodaizepines)   Do not take more than prescribed Pain, Sleep and Anxiety Medications. It is not advisable to combine anxiety,sleep and pain medications without talking with your primary care practitioner   Special Instructions: If you have smoked or chewed Tobacco  in the last 2 yrs please stop smoking, stop any regular Alcohol  and or any Recreational drug use.   Wear Seat belts while driving.   Please note: You were cared for by a hospitalist during your hospital stay. Once you are discharged, your primary care physician will handle any further medical issues. Please note that NO REFILLS for any discharge medications will be authorized once you are discharged, as it is imperative that you return to your primary care physician (or establish a relationship with a primary care physician if you do  not have one) for your post hospital discharge needs so that they can reassess your need for medications and monitor your lab values.  Time coordinating discharge: 35 minutes  SIGNED:  Nilda Fendt, MD, PhD 04/13/2024, 9:08 AM

## 2024-04-13 NOTE — TOC Transition Note (Signed)
 Transition of Care [facility]) - Discharge Note   Patient Details  Name: Todd [name]. MRN: [id number] Date of Birth: 03-Mar-1956  [facility] CM/SW Contact:  Bridget Cordella Simmonds, LCSW Phone Number: 04/13/2024, 10:40 AM   Clinical Narrative:   Pt discharging to [facility].  RN report to [phone].  Room available after 4pm.  PTAR scheduled for 4pm pickup from the discharge lounge.      Final next level of care: Skilled Nursing Facility Barriers to Discharge: Barriers Resolved   Patient Goals and CMS Choice            Discharge Placement              Patient chooses bed at: [facility] Patient to be transferred to facility by: ptar Name of family member notified: left message neice Tica Patient and family notified of of transfer: 04/13/24  Discharge Plan and Services Additional resources added to the After Visit Summary for   In-house Referral: Clinical Social Work Discharge Planning Services: CM Consult                                 Social Drivers of Health (SDOH) Interventions SDOH Screenings   Food Insecurity: Food Insecurity Present (04/07/2024)  Housing: High Risk (04/07/2024)  Transportation Needs: Unmet Transportation Needs (04/07/2024)  Utilities: Not At Risk (04/07/2024)  Alcohol Screen: Low Risk  (04/20/2023)  Financial Resource Strain: Low Risk  (10/27/2022)   Received from [facility]  Physical Activity: Inactive (10/27/2022)   Received from [facility]  Social Connections: Socially Isolated (04/07/2024)  Stress: Stress Concern Present (10/27/2022)   Received from [facility]  Tobacco Use: High Risk (04/08/2024)  Health Literacy: Low Risk  (10/27/2022)   Received from [facility]     Readmission Risk Interventions    10/11/2023    3:09 PM 09/02/2021    1:12 PM  Readmission Risk Prevention Plan  Transportation Screening Complete Complete  PCP or Specialist Appt within 3-5 Days Complete    HRI or Home Care Consult Complete Complete  Social Work Consult for Recovery Care Planning/Counseling Complete Complete  Palliative Care Screening Complete Not Applicable  Medication Review Oceanographer) Complete

## 2024-04-13 NOTE — TOC Progression Note (Addendum)
 Transition of Care (TOC) - Progression Note    Patient Details  Name: Todd [name]. MRN: [id number] Date of Birth: 1956-03-13  Transition of Care [facility]) CM/SW Contact  Bridget Cordella Simmonds, LCSW Phone Number: 04/13/2024, 8:38 AM  Clinical Narrative:   SNF auth approved: [id number], 5 days: 8/28-9/01.  CSW confirmed with [facility]: they can receive pt today but room will not be available until late afternoon.   1040: Nat Draper/APS updated and aware of DC to SNF today.   Expected Discharge Plan: Skilled Nursing Facility Barriers to Discharge: No Barriers Identified               Expected Discharge Plan and Services In-house Referral: Clinical Social Work Discharge Planning Services: CM Consult                                           Social Drivers of Health (SDOH) Interventions SDOH Screenings   Food Insecurity: Food Insecurity Present (04/07/2024)  Housing: High Risk (04/07/2024)  Transportation Needs: Unmet Transportation Needs (04/07/2024)  Utilities: Not At Risk (04/07/2024)  Alcohol Screen: Low Risk  (04/20/2023)  Financial Resource Strain: Low Risk  (10/27/2022)   Received from [facility]  Physical Activity: Inactive (10/27/2022)   Received from [facility]  Social Connections: Socially Isolated (04/07/2024)  Stress: Stress Concern Present (10/27/2022)   Received from [facility]  Tobacco Use: High Risk (04/08/2024)  Health Literacy: Low Risk  (10/27/2022)   Received from [facility]    Readmission Risk Interventions    10/11/2023    3:09 PM 09/02/2021    1:12 PM  Readmission Risk Prevention Plan  Transportation Screening Complete Complete  PCP or Specialist Appt within 3-5 Days Complete   HRI or Home Care Consult Complete Complete  Social Work Consult for Recovery Care Planning/Counseling Complete Complete  Palliative Care Screening Complete Not Applicable  Medication Review Oceanographer) Complete

## 2024-04-13 NOTE — Progress Notes (Signed)
 Speech Language Pathology Treatment: Dysphagia  Patient Details Name: Todd [name]. MRN: [id number] DOB: 1956-05-15 Today's Date: 04/13/2024 Time: 9049-9040 SLP Time Calculation (min) (ACUTE ONLY): 9 min  Assessment / Plan / Recommendation Clinical Impression  Pt reports improvement in his swallowing, including less pain. He declined solids, but consumed thin liquids with Min cues needed for implementation of swallowing strategies, especially to use an intentional throat clear or cough intermittently. Rationale was given based on MBS results. Pt says he notices improvements (less pain) when he takes smaller bites/sips, chews his food well, and uses a liquid wash, so it sounds like he is using these strategies fairly consistently when eating solids. Recommend that he continue with current diet (Dys 2 solids, thin liquids) with use of aspiration and esophageal precautions.    HPI HPI: Todd [name]. is a 68 y.o. male who presented to the ED with testicular pain on the left, bilateral knee pain, and epigastric pain. SLP consulted for pt c/o dysphagia. 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 EGD 8/24 showed severe esophagitis, lower esophageal stricture (nonobstructive), and gastritis. Previous swallowing testing includes esophagram in February 2025 with stasis of tablet as well as SLP eval 10/11/23 with recs for D3/thin. Pt with with medical history significant for paroxysmal a flutter, HTN, CHFpEF, polysubstance use, who admits to drinking at least 1 beer daily and reports being on chronic pain meds daily.      SLP Plan  Continue with current plan of care          Recommendations  Diet recommendations: Dysphagia 3 (mechanical soft);Thin liquid Liquids provided via: Cup;Straw Medication Administration: Crushed  with puree Supervision: Patient able to self feed Compensations: Slow rate;Small sips/bites;Follow solids with liquid Postural Changes and/or Swallow Maneuvers: Seated upright 90 degrees;Upright 30-60 min after meal                  Oral care BID     Dysphagia, pharyngoesophageal phase (R13.14)     Continue with current plan of care     Todd [name]., M.A. CCC-SLP Acute Rehabilitation Services Office: [phone]  Secure chat preferred   04/13/2024, 12:38 PM

## 2024-04-13 NOTE — Plan of Care (Signed)
